# Patient Record
Sex: Male | Born: 1955 | Race: White | Hispanic: No | Marital: Single | Smoking: Former smoker
Health system: Southern US, Community
[De-identification: ages and names within clinical notes are randomized; demographics above are authoritative.]

## PROBLEM LIST (undated history)

## (undated) DIAGNOSIS — K529 Noninfective gastroenteritis and colitis, unspecified: Secondary | ICD-10-CM

## (undated) DIAGNOSIS — K209 Esophagitis, unspecified without bleeding: Secondary | ICD-10-CM

## (undated) DIAGNOSIS — K589 Irritable bowel syndrome without diarrhea: Secondary | ICD-10-CM

## (undated) DIAGNOSIS — E222 Syndrome of inappropriate secretion of antidiuretic hormone: Secondary | ICD-10-CM

## (undated) DIAGNOSIS — D649 Anemia, unspecified: Secondary | ICD-10-CM

## (undated) DIAGNOSIS — M459 Ankylosing spondylitis of unspecified sites in spine: Secondary | ICD-10-CM

## (undated) DIAGNOSIS — F1011 Alcohol abuse, in remission: Secondary | ICD-10-CM

## (undated) DIAGNOSIS — K602 Anal fissure, unspecified: Secondary | ICD-10-CM

## (undated) DIAGNOSIS — G8929 Other chronic pain: Secondary | ICD-10-CM

## (undated) DIAGNOSIS — K089 Disorder of teeth and supporting structures, unspecified: Secondary | ICD-10-CM

## (undated) DIAGNOSIS — R51 Headache: Secondary | ICD-10-CM

## (undated) DIAGNOSIS — K635 Polyp of colon: Secondary | ICD-10-CM

## (undated) DIAGNOSIS — F32A Depression, unspecified: Secondary | ICD-10-CM

## (undated) DIAGNOSIS — I10 Essential (primary) hypertension: Secondary | ICD-10-CM

## (undated) DIAGNOSIS — F329 Major depressive disorder, single episode, unspecified: Secondary | ICD-10-CM

## (undated) DIAGNOSIS — K219 Gastro-esophageal reflux disease without esophagitis: Secondary | ICD-10-CM

## (undated) DIAGNOSIS — R519 Headache, unspecified: Secondary | ICD-10-CM

## (undated) DIAGNOSIS — K2970 Gastritis, unspecified, without bleeding: Secondary | ICD-10-CM

## (undated) DIAGNOSIS — F419 Anxiety disorder, unspecified: Secondary | ICD-10-CM

## (undated) DIAGNOSIS — K317 Polyp of stomach and duodenum: Secondary | ICD-10-CM

## (undated) DIAGNOSIS — M1990 Unspecified osteoarthritis, unspecified site: Secondary | ICD-10-CM

## (undated) HISTORY — DX: Headache: R51

## (undated) HISTORY — PX: ESOPHAGOGASTRODUODENOSCOPY: SHX1529

## (undated) HISTORY — DX: Anal fissure, unspecified: K60.2

## (undated) HISTORY — PX: COLONOSCOPY W/ BIOPSIES: SHX1374

## (undated) HISTORY — DX: Anemia, unspecified: D64.9

## (undated) HISTORY — PX: TONSILLECTOMY: SUR1361

## (undated) HISTORY — DX: Polyp of colon: K63.5

## (undated) HISTORY — DX: Polyp of stomach and duodenum: K31.7

## (undated) HISTORY — DX: Headache, unspecified: R51.9

## (undated) HISTORY — PX: HERNIA REPAIR: SHX51

## (undated) HISTORY — DX: Unspecified osteoarthritis, unspecified site: M19.90

## (undated) HISTORY — DX: Other chronic pain: G89.29

---

## 1997-05-13 ENCOUNTER — Encounter: Payer: Self-pay | Admitting: Internal Medicine

## 1998-07-20 ENCOUNTER — Other Ambulatory Visit: Admission: RE | Admit: 1998-07-20 | Discharge: 1998-07-20 | Payer: Self-pay | Admitting: Internal Medicine

## 2000-03-26 ENCOUNTER — Ambulatory Visit (HOSPITAL_COMMUNITY): Admission: RE | Admit: 2000-03-26 | Discharge: 2000-03-26 | Payer: Self-pay | Admitting: Internal Medicine

## 2000-03-26 ENCOUNTER — Encounter: Payer: Self-pay | Admitting: Internal Medicine

## 2004-08-11 ENCOUNTER — Ambulatory Visit: Payer: Self-pay | Admitting: Internal Medicine

## 2004-09-01 ENCOUNTER — Ambulatory Visit: Payer: Self-pay | Admitting: Internal Medicine

## 2004-09-14 ENCOUNTER — Ambulatory Visit: Payer: Self-pay | Admitting: Internal Medicine

## 2005-04-25 ENCOUNTER — Ambulatory Visit: Payer: Self-pay | Admitting: Internal Medicine

## 2006-04-03 ENCOUNTER — Ambulatory Visit: Payer: Self-pay | Admitting: Internal Medicine

## 2006-04-24 ENCOUNTER — Ambulatory Visit: Payer: Self-pay | Admitting: Internal Medicine

## 2007-07-01 ENCOUNTER — Telehealth: Payer: Self-pay | Admitting: Internal Medicine

## 2007-07-03 DIAGNOSIS — K589 Irritable bowel syndrome without diarrhea: Secondary | ICD-10-CM

## 2007-07-03 DIAGNOSIS — K279 Peptic ulcer, site unspecified, unspecified as acute or chronic, without hemorrhage or perforation: Secondary | ICD-10-CM | POA: Insufficient documentation

## 2007-07-03 DIAGNOSIS — I10 Essential (primary) hypertension: Secondary | ICD-10-CM

## 2007-07-17 ENCOUNTER — Ambulatory Visit: Payer: Self-pay | Admitting: Internal Medicine

## 2008-07-14 ENCOUNTER — Telehealth: Payer: Self-pay | Admitting: Internal Medicine

## 2008-07-14 ENCOUNTER — Encounter (INDEPENDENT_AMBULATORY_CARE_PROVIDER_SITE_OTHER): Payer: Self-pay | Admitting: *Deleted

## 2008-07-14 ENCOUNTER — Inpatient Hospital Stay (HOSPITAL_COMMUNITY): Admission: EM | Admit: 2008-07-14 | Discharge: 2008-07-15 | Payer: Self-pay | Admitting: Emergency Medicine

## 2008-07-14 ENCOUNTER — Ambulatory Visit: Payer: Self-pay | Admitting: Internal Medicine

## 2008-07-21 ENCOUNTER — Telehealth: Payer: Self-pay | Admitting: Internal Medicine

## 2008-08-03 ENCOUNTER — Encounter: Payer: Self-pay | Admitting: Internal Medicine

## 2008-08-10 ENCOUNTER — Ambulatory Visit (HOSPITAL_COMMUNITY): Admission: RE | Admit: 2008-08-10 | Discharge: 2008-08-10 | Payer: Self-pay | Admitting: Urology

## 2008-08-11 ENCOUNTER — Telehealth: Payer: Self-pay | Admitting: Internal Medicine

## 2008-08-13 ENCOUNTER — Ambulatory Visit: Payer: Self-pay | Admitting: Internal Medicine

## 2008-08-13 DIAGNOSIS — Z9119 Patient's noncompliance with other medical treatment and regimen: Secondary | ICD-10-CM

## 2008-08-13 DIAGNOSIS — F1020 Alcohol dependence, uncomplicated: Secondary | ICD-10-CM | POA: Insufficient documentation

## 2008-08-13 DIAGNOSIS — Z86010 Personal history of colonic polyps: Secondary | ICD-10-CM

## 2008-08-18 ENCOUNTER — Ambulatory Visit: Payer: Self-pay | Admitting: Internal Medicine

## 2008-08-19 ENCOUNTER — Telehealth: Payer: Self-pay | Admitting: Internal Medicine

## 2008-08-27 ENCOUNTER — Telehealth: Payer: Self-pay | Admitting: Internal Medicine

## 2008-08-31 ENCOUNTER — Telehealth: Payer: Self-pay | Admitting: Internal Medicine

## 2008-09-14 ENCOUNTER — Telehealth: Payer: Self-pay | Admitting: Internal Medicine

## 2008-09-15 ENCOUNTER — Telehealth: Payer: Self-pay | Admitting: Internal Medicine

## 2008-09-23 ENCOUNTER — Telehealth: Payer: Self-pay | Admitting: Internal Medicine

## 2008-10-12 ENCOUNTER — Telehealth: Payer: Self-pay | Admitting: Internal Medicine

## 2008-10-22 ENCOUNTER — Telehealth: Payer: Self-pay | Admitting: Internal Medicine

## 2008-10-23 ENCOUNTER — Encounter: Payer: Self-pay | Admitting: Internal Medicine

## 2008-10-23 ENCOUNTER — Ambulatory Visit: Payer: Self-pay | Admitting: Internal Medicine

## 2008-11-03 ENCOUNTER — Encounter: Payer: Self-pay | Admitting: Internal Medicine

## 2008-11-04 ENCOUNTER — Telehealth: Payer: Self-pay | Admitting: Internal Medicine

## 2008-11-06 ENCOUNTER — Telehealth: Payer: Self-pay | Admitting: Internal Medicine

## 2008-11-30 ENCOUNTER — Telehealth (INDEPENDENT_AMBULATORY_CARE_PROVIDER_SITE_OTHER): Payer: Self-pay | Admitting: *Deleted

## 2008-12-01 ENCOUNTER — Ambulatory Visit: Payer: Self-pay | Admitting: Internal Medicine

## 2008-12-04 LAB — CONVERTED CEMR LAB
ALT: 10 units/L (ref 0–53)
AST: 20 units/L (ref 0–37)
Albumin: 4.1 g/dL (ref 3.5–5.2)
Alkaline Phosphatase: 106 units/L (ref 39–117)
BUN: 4 mg/dL — ABNORMAL LOW (ref 6–23)
Basophils Absolute: 0 10*3/uL (ref 0.0–0.1)
Basophils Relative: 0.1 % (ref 0.0–3.0)
Bilirubin, Direct: 0.1 mg/dL (ref 0.0–0.3)
CO2: 32 meq/L (ref 19–32)
Calcium: 9.6 mg/dL (ref 8.4–10.5)
Chloride: 97 meq/L (ref 96–112)
Creatinine, Ser: 0.6 mg/dL (ref 0.4–1.5)
Eosinophils Absolute: 0.1 10*3/uL (ref 0.0–0.7)
Eosinophils Relative: 0.8 % (ref 0.0–5.0)
GFR calc non Af Amer: 149.98 mL/min (ref >60)
Glucose, Bld: 99 mg/dL (ref 70–99)
HCT: 44.3 % (ref 39.0–52.0)
Hemoglobin: 15.2 g/dL (ref 13.0–17.0)
Lymphocytes Relative: 11.7 % — ABNORMAL LOW (ref 12.0–46.0)
Lymphs Abs: 1 10*3/uL (ref 0.7–4.0)
MCHC: 34.4 g/dL (ref 30.0–36.0)
MCV: 96.6 fL (ref 78.0–100.0)
Monocytes Absolute: 0.8 10*3/uL (ref 0.1–1.0)
Monocytes Relative: 9.7 % (ref 3.0–12.0)
Neutro Abs: 6.4 10*3/uL (ref 1.4–7.7)
Neutrophils Relative %: 77.7 % — ABNORMAL HIGH (ref 43.0–77.0)
Platelets: 414 10*3/uL — ABNORMAL HIGH (ref 150.0–400.0)
Potassium: 3.7 meq/L (ref 3.5–5.1)
RBC: 4.59 M/uL (ref 4.22–5.81)
RDW: 11.7 % (ref 11.5–14.6)
Sed Rate: 19 mm/hr (ref 0–22)
Sodium: 139 meq/L (ref 135–145)
TSH: 1 microintl units/mL (ref 0.35–5.50)
Total Bilirubin: 0.6 mg/dL (ref 0.3–1.2)
Total CK: 45 units/L (ref 7–232)
Total Protein: 8.1 g/dL (ref 6.0–8.3)
WBC: 8.3 10*3/uL (ref 4.5–10.5)

## 2008-12-08 ENCOUNTER — Telehealth: Payer: Self-pay | Admitting: Internal Medicine

## 2008-12-17 ENCOUNTER — Ambulatory Visit: Payer: Self-pay | Admitting: Internal Medicine

## 2008-12-17 ENCOUNTER — Telehealth: Payer: Self-pay | Admitting: Internal Medicine

## 2008-12-31 ENCOUNTER — Ambulatory Visit: Payer: Self-pay | Admitting: Internal Medicine

## 2009-03-09 ENCOUNTER — Encounter (INDEPENDENT_AMBULATORY_CARE_PROVIDER_SITE_OTHER): Payer: Self-pay | Admitting: *Deleted

## 2009-08-25 ENCOUNTER — Telehealth: Payer: Self-pay | Admitting: Internal Medicine

## 2009-09-24 ENCOUNTER — Emergency Department (HOSPITAL_COMMUNITY): Admission: EM | Admit: 2009-09-24 | Discharge: 2009-09-24 | Payer: Self-pay | Admitting: Family Medicine

## 2009-09-27 ENCOUNTER — Emergency Department (HOSPITAL_COMMUNITY): Admission: EM | Admit: 2009-09-27 | Discharge: 2009-09-27 | Payer: Self-pay | Admitting: Emergency Medicine

## 2009-09-27 ENCOUNTER — Telehealth: Payer: Self-pay | Admitting: Internal Medicine

## 2009-10-01 ENCOUNTER — Telehealth: Payer: Self-pay | Admitting: Internal Medicine

## 2009-10-14 ENCOUNTER — Telehealth (INDEPENDENT_AMBULATORY_CARE_PROVIDER_SITE_OTHER): Payer: Self-pay | Admitting: *Deleted

## 2009-11-17 ENCOUNTER — Ambulatory Visit: Payer: Self-pay | Admitting: Internal Medicine

## 2009-11-18 LAB — CONVERTED CEMR LAB
ALT: 14 units/L (ref 0–53)
AST: 19 units/L (ref 0–37)
Albumin: 4.3 g/dL (ref 3.5–5.2)
Alkaline Phosphatase: 109 units/L (ref 39–117)
BUN: 7 mg/dL (ref 6–23)
Bilirubin, Direct: 0.2 mg/dL (ref 0.0–0.3)
CO2: 31 meq/L (ref 19–32)
Calcium: 9.6 mg/dL (ref 8.4–10.5)
Chloride: 94 meq/L — ABNORMAL LOW (ref 96–112)
Creatinine, Ser: 0.6 mg/dL (ref 0.4–1.5)
GFR calc non Af Amer: 155.39 mL/min (ref >60)
Glucose, Bld: 69 mg/dL — ABNORMAL LOW (ref 70–99)
Potassium: 4.1 meq/L (ref 3.5–5.1)
Sodium: 137 meq/L (ref 135–145)
TSH: 1.18 microintl units/mL (ref 0.35–5.50)
Total Bilirubin: 0.7 mg/dL (ref 0.3–1.2)
Total Protein: 7.5 g/dL (ref 6.0–8.3)

## 2009-11-25 ENCOUNTER — Telehealth: Payer: Self-pay | Admitting: Internal Medicine

## 2010-03-08 ENCOUNTER — Telehealth: Payer: Self-pay | Admitting: Internal Medicine

## 2010-04-19 ENCOUNTER — Ambulatory Visit: Payer: Self-pay | Admitting: Internal Medicine

## 2010-04-19 DIAGNOSIS — M549 Dorsalgia, unspecified: Secondary | ICD-10-CM

## 2010-04-25 ENCOUNTER — Telehealth: Payer: Self-pay | Admitting: Internal Medicine

## 2010-05-09 ENCOUNTER — Telehealth: Payer: Self-pay | Admitting: Internal Medicine

## 2010-05-09 ENCOUNTER — Ambulatory Visit: Payer: Self-pay | Admitting: Internal Medicine

## 2010-05-10 ENCOUNTER — Telehealth: Payer: Self-pay | Admitting: Internal Medicine

## 2010-05-13 ENCOUNTER — Telehealth: Payer: Self-pay | Admitting: Internal Medicine

## 2010-05-17 ENCOUNTER — Telehealth: Payer: Self-pay | Admitting: Internal Medicine

## 2010-07-05 NOTE — Progress Notes (Signed)
Summary: med question  Phone Note From Pharmacy   Caller: [facility]  [phone]* Call For: swords  Reason for Call: Needs renewal Summary of Call: refill lorazepam 0.79m 1 by mouth every 6 hours as needed (not to exceed 15 in 30 days)....refill hx is 04/11/10, 03/08/10, 02/08/10 Initial call taken by: CTownsend Roger CPisgah  May 09, 2010 3:58 PM  Follow-up for Phone Call        ok x 3 Follow-up by: BPhoebe SharpsMD,  May 10, 2010 8:21 AM    Prescriptions: LORAZEPAM 0.5 MG TABS (LORAZEPAM) Take one tablet every 6 hours as needed nerves--max 15 tabs in 30 days  #15 x 3   Entered by:   FRolla FlattenCMA   Authorized by:   BPhoebe SharpsMD   Signed by:   FRolla FlattenCMA on 05/10/2010   Method used:   Printed then faxed to ...       [facility][phone] (retail)       [address]      [place]  [address]      Ph: [phone] [phone]      Fax: [phone]  RxID:   [id number]

## 2010-07-05 NOTE — Progress Notes (Signed)
Summary: requesting new rx for nasal spray  Phone Note Call from Patient Call back at [phone]   Caller: Patient---live call Summary of Call: pt is requesting Fluconazole spray for his nose. Send to [facility] on [address]. Initial call taken by: Despina Arias,  March 08, 2010 9:42 AM  Follow-up for Phone Call        assuming---fluticasone see Rx Follow-up by: [name] MD,  March 08, 2010 10:23 AM    New/Updated Medications: FLUTICASONE PROPIONATE 50 MCG/ACT  SUSP (FLUTICASONE PROPIONATE) 2 sprays each nostril once daily Prescriptions: FLUTICASONE PROPIONATE 50 MCG/ACT  SUSP (FLUTICASONE PROPIONATE) 2 sprays each nostril once daily  #1 vial x 3   Entered and Authorized by:   [name] MD   Signed by:   [name] MD on 03/08/2010   Method used:   Electronically to        [facility]  [phone]* (retail)       [address]       [place], [place]  [address]       Ph: [phone] or [phone]       Fax: [phone]   RxID:   [id number]

## 2010-07-05 NOTE — Assessment & Plan Note (Signed)
Summary: fup//ccm/pt rescd//ccm/mom rescd from bump//ccm   Vital Signs:  Patient profile:   55 year old male Weight:      153 pounds BMI:     22.03 Temp:     98.5 degrees F oral Pulse rate:   68 / minute Pulse rhythm:   regular Resp:     12 per minute BP sitting:   164 / 82  (left arm) Cuff size:   regular  Vitals Entered By: Rica Records, RN (November 17, 2009 10:27 AM) CC: med review, needs refill lorazepam Is Patient Diabetic? No   Primary Care Provider:  [name] MD  CC:  med review and needs refill lorazepam.  History of Present Illness:  Follow-Up Visit      This is a 55 year old man who presents for Follow-up visit.  The patient denies chest pain and palpitations.  Since the last visit the patient notes a recent ED visit.  The patient reports taking meds as prescribed.  When questioned about possible medication side effects, the patient notes none.   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 1 By Mouth Once Daily 2)  Lorazepam 0.5 Mg Tabs (Lorazepam) .... Take One Tablet Every 6 Hours As Needed Nerves--Max 15 Tabs in 30 Days 3)  Nexium 40 Mg Cpdr (Esomeprazole Magnesium) .... Take 1 Capsule By Mouth Once A Day  Allergies: 1)  ! Benadryl  Past History:  Past Medical History: Last updated:  08/13/2008 GERD Hypertension IBS Left-sided ul;cerative colitis BPH  Past Surgical History: Last updated: 08/13/2008 Hernia Surgery  Family History: Last updated: 08/13/2008 mother  MI Family History of Colon Cancer:mother Family History of Colitis/Crohn's: father  Social History: Last updated: 08/13/2008 Occupation: unemployed Patient is a former smoker.  Alcohol Use - yes 2-3 beers per day Daily Caffeine Use 1 per day Illicit Drug Use - no Patient does not get regular exercise.   Risk Factors: Exercise: no (08/13/2008)  Risk Factors: Smoking Status: quit (11/17/2009) Packs/Day: 2-3 cigs (07/03/2007)  Physical Exam  General:  alert and well-developed.  appears older than stated age Head:  normocephalic and atraumatic.   Eyes:  pupils equal and pupils round.   Ears:  R ear normal and L ear normal.   Neck:  No deformities, masses, or tenderness noted. Chest Wall:  no deformities and no tenderness.   Lungs:  normal respiratory effort and no intercostal retractions.   Heart:  normal rate and regular rhythm.   Abdomen:  Soft, nontender and nondistended. No masses, hepatosplenomegaly or hernias noted. Normal bowel sounds.  thin Msk:  No deformity or scoliosis noted of thoracic or lumbar spine.   Neurologic:  cranial nerves II-XII intact and gait normal.     Impression & Recommendations:  Problem # 1:  HYPONATREMIA (ICD-276.1) needs f/u  Orders: TLB-BMP (Basic Metabolic Panel-BMET) (30160-FUXNATF) TLB-TSH (Thyroid  Stimulating Hormone) (84443-TSH)  Problem # 2:  ALCOHOLISM (ICD-303.90) this is likely his main issue advised that he call [facility] Orders: Venipuncture (73419) TLB-Hepatic/Liver Function Pnl (80076-HEPATIC)  Problem # 3:  HYPERTENSION (ICD-401.9) out of meds will refill His updated medication list for this problem includes:    Verapamil Hcl Cr 240 Mg Tbcr (Verapamil hcl) .Marland Kitchen... 1 by mouth once daily  BP today: 164/82 Prior BP: 150/76  (12/01/2008)  Labs Reviewed: K+: 3.7 (12/01/2008) Creat: : 0.6 (12/01/2008)     Complete Medication List: 1)  Verapamil Hcl Cr 240 Mg Tbcr (Verapamil hcl) .Marland Kitchen.. 1 by mouth once daily 2)  Lorazepam 0.5 Mg Tabs (Lorazepam) .... Take one tablet every 6 hours as needed nerves--max 15 tabs in 30 days 3)  Nexium 40 Mg Cpdr (Esomeprazole magnesium) .... Take 1 capsule by mouth once a day Prescriptions: LORAZEPAM 0.5 MG TABS (LORAZEPAM) Take one tablet every 6 hours as needed nerves--max 15 tabs in 30 days  #15 x 1   Entered and Authorized by:   [name] MD   Signed by:   [name] MD on 11/17/2009   Method used:   Print then Give to Patient   RxID:   [id number] VERAPAMIL HCL CR 240 MG TBCR (VERAPAMIL HCL) 1 by mouth once daily  #90 Tablet x 3   Entered and Authorized by:   [name] MD   Signed by:   [name] MD on 11/17/2009   Method used:   Electronically to        [facility]  [phone]* (retail)       [address]       [place], [place]  [address]       Ph: [phone] or [phone]       Fax: [phone]   RxID:   [id number]

## 2010-07-05 NOTE — Progress Notes (Signed)
  Phone Note Call from Patient Call back at Home Phone [phone]   Caller: Patient Call For: [name] MD Summary of Call: Pt states he cannot take NSAID due to ulcerative colitis. Initial call taken by: [facility] CMA AAMA,  May 13, 2010 1:33 PM

## 2010-07-05 NOTE — Progress Notes (Signed)
  Phone Note Call from Patient   Caller: Patient Call For: [name] MD Summary of Call: Pt calls stating he is extremely weak, cannot eat, ? fever, was put on antibiotics 3 days ago but feels worse.  Does not feel well enough to come to the office.  Will go to the ER. Initial call taken by: Deanna Artis CMA,  September 27, 2009 10:11 AM

## 2010-07-05 NOTE — Progress Notes (Signed)
Summary: bloodwork results  Phone Note Call from Patient Call back at Home Phone [phone]   Caller: Patient Call For: [name] MD Summary of Call: pt would like bloodwork results Initial call taken by: Glo Herring,  November 25, 2009 2:30 PM  Follow-up for Phone Call        tried to call line is busy Follow-up by: Westley Hummer CMA Deborra Medina),  November 25, 2009 3:22 PM  Additional Follow-up for Phone Call Additional follow up Details #1::        Patient notified. Was told we had tried last week but phone had been disconnected.  Have correct number now. Additional Follow-up by: Rica Records, RN,  November 26, 2009 12:41 PM

## 2010-07-05 NOTE — Progress Notes (Signed)
Summary: FYI  Phone Note Call from Patient   Caller: Mom-phil Call For: [name] MD Summary of Call: mom would like nurse to return her call Initial call taken by: Glo Herring,  August 25, 2009 9:31 AM  Follow-up for Phone Call        Mom is calling to let Dr Leanne Chang know before the patient comes in about what is going on.  She is"afraid that the patient will not tell everything".  He has anxiety, problems picking up things, and back and neck pain.  He has an appointment Friday morning Follow-up by: Westley Hummer CMA Deborra Medina),  August 25, 2009 11:36 AM

## 2010-07-05 NOTE — Progress Notes (Signed)
Summary: Pt req to get order to have blood sodium lvl checked  Phone Note Call from Patient Call back at Home Phone [phone]   Caller: Patient Summary of Call: Pt says that he is suppose to sch labs for blood sodium lvl. Need an order. Please advise. Initial call taken by: Braulio Bosch,  October 01, 2009 5:01 PM  Follow-up for Phone Call        ok 995.2 Follow-up by: [name] MD,  Oct 04, 2009 9:33 AM

## 2010-07-05 NOTE — Assessment & Plan Note (Signed)
Summary: BACK PAIN/NJR/PT HAS SORETHROAT PAIN/CONGESTION/CJR   Vital Signs:  Patient profile:   55 year old male Weight:      160 pounds Temp:     98.3 degrees F oral BP sitting:   142 / 74  (left arm) Cuff size:   regular  Vitals Entered By: Townsend Roger, CMA (April 19, 2010 11:18 AM) CC: back pain, st, congestion x3-4 days, Back pain   Primary Care Provider:  [name] MD  CC:  back pain, st, congestion x3-4 days, and Back pain.  History of Present Illness:  Back Pain      This is a 55 year old man who presents with Back pain.  The symptoms began 1 year or more ago.  The patient denies fever, chills, weakness, loss of sensation, fecal incontinence, and urinary incontinence.  The pain is located in the mid low back.  The pain began at home and gradually.  The pain is made worse by standing or walking.  The pain is made better by inactivity.  Risk factors for serious underlying conditions include duration of pain > 1 month and age >= 50 years.    ROS: All other systems reviewed and were negative except for chronic fatigue recent  ST, no fever or chills, sxs for 3 days.     Current Medications (verified): 1)  Verapamil Hcl Cr 240 Mg Tbcr (Verapamil Hcl) .Marland Kitchen.. 1 By Mouth Once Daily 2)  Lorazepam 0.5 Mg Tabs (Lorazepam) .... Take One Tablet Every 6 Hours As Needed Nerves--Max 15 Tabs in 30 Days 3)  Nexium 40 Mg Cpdr (Esomeprazole Magnesium) .... Take 1 Capsule By Mouth Once A Day 4)  Fluticasone Propionate 50 Mcg/act  Susp (Fluticasone Propionate) .... 2 Sprays Each Nostril Once Daily  Allergies (verified): 1)  ! Benadryl  Physical Exam  General:  well-developed well-nourished male in no acute distress. HEENT exam atraumatic, normocephalic symmetric her muscles are intact. Oropharynx is moist. Posterior oropharynx without erythema or exudate. Neck is supple without lymphadenopathy chest clear to auscultation cardiac exam S1-S2 are regular. Examination of back appears normal.  No palpable tenderness to lumbar spine. Straight leg raise is negative. Deep tendon reflexes are normal bilaterally in the lower extremities. Gait is normal.   Impression & Recommendations:  Problem # 1:  BACK PAIN (ICD-724.5) I suspect this is chronic musculoskeletal back pain. Given the duration and is best to check an x-ray. I'll arrange that next week. I'll call patient back with the results and further recommendations. Orders: T-Lumbar Spine Complete, 5 Views (71110TC)  Problem # 2:  URI (ICD-465.9) no evidence of bacterial infection. call for any concerns, increased sxs, fever, persistence of sxs, wheeze, SOB.   Complete Medication List: 1)  Verapamil Hcl Cr 240 Mg Tbcr (Verapamil hcl) .Marland Kitchen.. 1 by mouth once daily 2)  Lorazepam 0.5 Mg Tabs (Lorazepam) .... Take one tablet every 6 hours as needed nerves--max 15 tabs in 30 days 3)  Nexium 40 Mg Cpdr (Esomeprazole magnesium) .... Take 1 capsule by mouth once a day 4)  Fluticasone Propionate 50 Mcg/act Susp (Fluticasone propionate) .... 2 sprays each nostril once daily   Orders Added: 1)  Est. Patient Level III [37290] 2)  T-Lumbar Spine Complete, 5 Views [71110TC]

## 2010-07-05 NOTE — Progress Notes (Signed)
Summary: pain med  Phone Note Outgoing Call   Caller: Patient Call placed by: Rolla Flatten CMA,  May 10, 2010 9:25 AM Details for Reason: call patient. Has changes of osteoarthritis in the back. If he is interested it might be worth sending him to physical therapy for evaluation and treatment back pain.   Summary of Call: Pt decline to do PT now due to not having insurance. Pt states that he discuss with Dr Judd Gaudier about getting a pain med Rx but was told he would have to wait for result of x-ray. Pt uses [facility]. Pls advise on med..............Marland KitchenFelecia Deloach CMA  May 10, 2010 9:28 AM   Follow-up for Phone Call        diclofenac 50 mg by mouth once daily as needed for pain. not to exceed 15 per month #15/3 refills Follow-up by: [name] MD,  May 10, 2010 11:45 AM  Additional Follow-up for Phone Call Additional follow up Details #1::        rx called in, pt aware Additional Follow-up by: Townsend Roger, [place],  May 10, 2010 3:45 PM    New/Updated Medications: DICLOFENAC SODIUM 50 MG TBEC (DICLOFENAC SODIUM) 1 by mouth once daily as needed for pain, not to exceed 15 per month Prescriptions: DICLOFENAC SODIUM 50 MG TBEC (DICLOFENAC SODIUM) 1 by mouth once daily as needed for pain, not to exceed 15 per month  #15 x 3   Entered by:   Townsend Roger, CMA   Authorized by:   [name] MD   Signed by:   Townsend Roger, CMA on 05/10/2010   Method used:   Electronically to        [facility]  [phone]* (retail)       [address]       [place], [place]  [address]       Ph: [phone] or [phone]       Fax: [phone]   RxID:   [id number]

## 2010-07-05 NOTE — Progress Notes (Signed)
Summary: FYI (INFO ONLY)  Phone Note Call from Patient   Caller: Patient      Summary of Call: INFO ONLY..... Pts mom wants Dr Leanne Chang aware of some things, request that note be sent ref same.......Marland KitchenPts mom called to adv that the pt will be going for his back xray today.... pt can't lift anything and is having problems ambulating....also adv pt is having problems with being restless, a nervousness the day prior to a doctors appt.....adv pt will call for appt with Dr Leanne Chang once he has had his xray done.  Initial call taken by: Duanne Moron,  April 25, 2010 9:13 AM

## 2010-07-05 NOTE — Assessment & Plan Note (Signed)
Summary: reck/jls   Vital Signs:  Patient Profile:   55 Years Old Male Weight:      134 pounds Temp:     98.4 degrees F oral Pulse rate:   72 / minute Pulse rhythm:   regular BP sitting:   150 / 78  (left arm)  Vitals Entered By: Chipper Oman, RN (July 17, 2007 10:21 AM)                 Chief Complaint:  ROV; ran out of med.Marland Kitchen  History of Present Illness: pt noncompliant with followup due to lack of insurance.  no trouble on meds for HTN--he has been out of medications for 6 days.  Hx of IBS---has seen GI    Current Allergies: No known allergies   Past Medical History:    Reviewed history from 07/03/2007 and no changes required:       GERD       Hypertension       irritable bowel syndrome   Family History:    mother  MI    Review of Systems       no other complaints in a complete ROS    Physical Exam  General:     Well-developed,well-nourished,in no acute distress; alert,appropriate and cooperative throughout examination Head:     atraumatic and no abnormalities observed.   Eyes:     pupils equal and pupils round.   Neck:     No deformities, masses, or tenderness noted. Lungs:     Normal respiratory effort, chest expands symmetrically. Lungs are clear to auscultation, no crackles or wheezes. Heart:     Normal rate and regular rhythm. S1 and S2 normal without gallop, murmur, click, rub or other extra sounds.    Impression & Recommendations:  Problem # 1:  HYPERTENSION (ICD-401.9) resume meds His updated medication list for this problem includes:    Verapamil Hcl Cr 240 Mg Tbcr (Verapamil hcl) .Marland Kitchen... 1 by mouth once daily needs ov   Complete Medication List: 1)  Verapamil Hcl Cr 240 Mg Tbcr (Verapamil hcl) .Marland Kitchen.. 1 by mouth once daily needs ov 2)  Proctocort 30 Mg Supp (Hydrocortisone acetate) .... Administer 1 suppository into rectum     Prescriptions: VERAPAMIL HCL CR 240 MG TBCR (VERAPAMIL HCL) 1 by mouth once daily needs ov  #90 x  11   Entered and Authorized by:   [name] MD   Signed by:   [name] MD on 07/17/2007   Method used:   Electronically sent to ...       [facility]  [phone]*       [place], [place]  [address]       Ph: [phone] or [phone]       Fax: [phone]   RxID:   [id number]  ]

## 2010-07-05 NOTE — Progress Notes (Signed)
Summary: Schedule recall office visit   Phone Note Outgoing Call Call back at [facility] Phone [phone]   Call placed by: Marlon Pel CMA Deborra Medina),  Oct 14, 2009 4:19 PM Call placed to: Patient Summary of Call: Called pt to schedule recall office visit. Pt states he has a lot of other medical problems and doctor's office appointments right now and will have to call us back to schedule a office visit.  Initial call taken by: Marlon Pel CMA [facility]),  Oct 14, 2009 4:20 PM

## 2010-07-07 NOTE — Progress Notes (Signed)
Summary: change med  Phone Note Call from Patient Call back at Home Phone [phone]   Caller: Patient Call For: swords Summary of Call: pt can't take Diclofenac (no NSAIDS) can you give him something else [facility] on [address] Initial call taken by: Townsend Roger, [place],  May 17, 2010 12:09 PM  Follow-up for Phone Call        put nsaids on allergy list  could try ultram 50 mg by mouth once daily as needed for pain not to  15 per month. #15/ 3 Follow-up by: [name] MD,  May 17, 2010 2:29 PM  Additional Follow-up for Phone Call Additional follow up Details #1::        Phone Call Completed, Rx Called In Additional Follow-up by: Townsend Roger, [place],  May 18, 2010 11:27 AM   New Allergies: NSAIDS New/Updated Medications: ULTRAM 50 MG TABS (TRAMADOL HCL) 1 by mouth once daily as needed for pain not to exceed 15 per month New Allergies: NSAIDSPrescriptions: ULTRAM 50 MG TABS (TRAMADOL HCL) 1 by mouth once daily as needed for pain not to exceed 15 per month  #15 x 3   Entered by:   Townsend Roger, CMA   Authorized by:   [name] MD   Signed by:   Townsend Roger, CMA on 05/18/2010   Method used:   Electronically to        [facility]  [phone]* (retail)       [address]       [place], [place]  [address]       Ph: [phone] or [phone]       Fax: [phone]   RxID:   [id number]

## 2010-07-11 ENCOUNTER — Ambulatory Visit: Payer: Self-pay | Admitting: Internal Medicine

## 2010-08-22 ENCOUNTER — Encounter: Payer: Self-pay | Admitting: Internal Medicine

## 2010-08-22 ENCOUNTER — Ambulatory Visit (INDEPENDENT_AMBULATORY_CARE_PROVIDER_SITE_OTHER): Payer: Self-pay | Admitting: Internal Medicine

## 2010-08-22 DIAGNOSIS — N400 Enlarged prostate without lower urinary tract symptoms: Secondary | ICD-10-CM | POA: Insufficient documentation

## 2010-08-22 DIAGNOSIS — K589 Irritable bowel syndrome without diarrhea: Secondary | ICD-10-CM

## 2010-08-22 DIAGNOSIS — M549 Dorsalgia, unspecified: Secondary | ICD-10-CM

## 2010-08-22 DIAGNOSIS — I10 Essential (primary) hypertension: Secondary | ICD-10-CM

## 2010-08-23 ENCOUNTER — Telehealth: Payer: Self-pay | Admitting: Internal Medicine

## 2010-08-23 LAB — POCT I-STAT, CHEM 8
BUN: 4 mg/dL — ABNORMAL LOW (ref 6–23)
Chloride: 91 mEq/L — ABNORMAL LOW (ref 96–112)
Creatinine, Ser: 0.6 mg/dL (ref 0.4–1.5)
Glucose, Bld: 107 mg/dL — ABNORMAL HIGH (ref 70–99)
HCT: 50 % (ref 39.0–52.0)
Potassium: 3.8 mEq/L (ref 3.5–5.1)

## 2010-08-23 LAB — URINALYSIS, ROUTINE W REFLEX MICROSCOPIC
Bilirubin Urine: NEGATIVE
Glucose, UA: NEGATIVE mg/dL
Nitrite: NEGATIVE
Specific Gravity, Urine: 1.02 (ref 1.005–1.030)
pH: 6.5 (ref 5.0–8.0)

## 2010-08-23 LAB — URINE MICROSCOPIC-ADD ON

## 2010-08-23 NOTE — Telephone Encounter (Signed)
See above

## 2010-09-01 NOTE — Assessment & Plan Note (Signed)
Summary: NEW PT / SWITCH FROM DR Leanne Chang Bonney Leitz  #   Vital Signs:  Patient profile:   55 year old male Height:      70 inches Weight:      162 pounds BMI:     23.33 O2 Sat:      98 % on Room air Temp:     97.6 degrees F oral Pulse rate:   79 / minute BP sitting:   142 / 78  (left arm) Cuff size:   regular  Vitals Entered By: Charlynne Cousins CMA (August 22, 2010 9:03 AM)  O2 Flow:  Room air CC: new pt here to est care with primary/ ab   Primary Care Provider:  Adella Hare  CC:  new pt here to est care with primary/ ab.  History of Present Illness: Patinet presents to establish for on-gong care in transfer from another [place] office.   He has chornic back pain and has beend diagnosed as having OA spine. The diagnosis is by L-S spine films. He says that he is very limited in his activities: can't lift and has trouble with work, i.e. Haematologist.    Preventive Screening-Counseling & Management  Alcohol-Tobacco     Alcohol drinks/day: 0     Smoking Status: quit     Year Quit: 2012  Caffeine-Diet-Exercise     Caffeine use/day: 1 cup per day     Does Patient Exercise: no  Hep-HIV-STD-Contraception     Dental Visit-last 6 months no     Sun Exposure-Excessive: no  Safety-Violence-Falls     Seat Belt Use: yes     Firearms in the Home: no firearms in the home     Smoke Detectors: yes     Violence in the Home: no risk noted     Sexual Abuse: no     Fall Risk: low fall risk      Blood Transfusions:  no.    Current Medications (verified): 1)  Verapamil Hcl Cr 240 Mg Tbcr (Verapamil Hcl) .Marland Kitchen.. 1 By Mouth Once Daily 2)  Lorazepam 0.5 Mg Tabs (Lorazepam) .... Take One Tablet Every 6 Hours As Needed Nerves--Max 15 Tabs in 30 Days 3)  Nexium 40 Mg Cpdr (Esomeprazole Magnesium) .... Take 1 Capsule By Mouth Once A Day 4)  Fluticasone Propionate 50 Mcg/act  Susp (Fluticasone Propionate) .... 2 Sprays Each Nostril Once Daily 5)  Ultram 50 Mg Tabs (Tramadol Hcl) .Marland Kitchen.. 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  Lives with Mother   Alcohol Use - yes 2-3 beers per day Daily Caffeine Use 1 per day Illicit Drug Use - no Patient does not get regular exercise.  Caffeine use/day:  1 cup per day Dental Care w/in 6 mos.:  no Sun Exposure-Excessive:  no Seat Belt Use:  yes Fall  Risk:  low fall risk Blood Transfusions:  no  Review of Systems       The patient complains of weight gain, dyspnea on exertion, abdominal pain, severe indigestion/heartburn, muscle weakness, difficulty walking, and depression.  The patient denies anorexia, fever, weight loss, vision loss, decreased hearing, prolonged cough, melena, hematochezia, hematuria, abnormal bleeding, enlarged lymph nodes, and testicular masses.    Physical Exam  General:  Thin, grey haired white male in no acute distress Head:  Normocephalic and atraumatic without obvious abnormalities. No apparent alopecia or balding. Eyes:  No corneal or conjunctival inflammation noted. EOMI. Perrla. Funduscopic exam benign, without hemorrhages, exudates or papilledema. Vision grossly normal. Ears:  R ear normal.  Left EAC  with crumen impaction Nose:  no external deformity and no external erythema.   Mouth:  good dentition, no gingival abnormalities, and no dental plaque.   Neck:  supple, full ROM, and no thyromegaly.   Chest Wall:  no deformities and no tenderness.   Lungs:  Normal respiratory effort, chest expands symmetrically. Lungs are clear to auscultation, no crackles or wheezes. Heart:  Normal rate and regular rhythm. S1 and S2 normal without gallop, murmur, click, rub or other extra sounds. Abdomen:  soft, non-tender, normal bowel sounds, no guarding, and no hepatomegaly.   Msk:  normal ROM, no joint tenderness, no joint warmth, and no joint deformities.  Stiffness in the back with movement. Stiffness in the neck with movement Pulses:  2+ radial. 1+ DP right Extremities:  No clubbing, cyanosis, edema, or deformity noted with normal full range of motion of all joints.   Neurologic:  alert & oriented X3, cranial nerves II-XII intact, strength normal in all extremities, sensation intact to light touch, sensation intact to pinprick, and DTRs symmetrical and normal.   Skin:  turgor normal, color normal, no rashes, no suspicious lesions, and no ulcerations.   Cervical Nodes:  no anterior cervical adenopathy and no posterior cervical adenopathy.   Psych:  Oriented X3, memory intact for recent and remote, normally interactive, good eye contact, and not anxious appearing.     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 He does follw with urology.  Problem # 2:  BACK PAIN (ICD-724.5) Chronic back pain wih previous eval with L-S spine films. With no change in condition further imaging studies do not seem indicated at this time.  Plan - encouraged flex/stretch exercises to minimize back pain and to maintain mobility           continue on tramadol as needed.  His updated medication list for this problem  includes:    Ultram 50 Mg Tabs (Tramadol hcl) .Marland Kitchen... 1 by mouth once daily as needed for pain not to exceed 15 per month  Problem # 3:  ALCOHOLISM (ICD-303.90) Continues in is sobrietary and abstention.  Problem # 4:  ULCERATIVE PROCTITIS ? LEFT UC (ICD-556.2) Discovered at colonoscopy by Dr. Anda Kraft. He states that he will be scheduling followup with Dr. Carlean Purl as he was instructed. He is not at this time having abdominal pain or hematochezia.  Problem # 5:  IRRITABLE BOWEL SYNDROME (ICD-564.1) Not on any regular medication. symptoms seem to be quiesscent.  Plan - life-style management with fiber in the diet and possibly a dietary fiber supplement, e.g. metamucil  Problem # 6:  HYPERTENSION (ICD-401.9)  His updated medication list for this problem includes:    Verapamil Hcl Cr 240  Mg Tbcr (Verapamil hcl) .Marland Kitchen... 1 by mouth once daily  BP today: 142/78 Prior BP: 142/74 (04/19/2010)  Labs Reviewed: K+: 4.1 (11/17/2009) Creat: : 0.6 (11/17/2009)     Just suboptimally controlled.. No change in medication at this time. If his SBP remains above 140 will add diuretic.  Problem # 7:  Preventive Health Care (ICD-V70.0) History indicates his problems are stable at this time. His exam is unremarkable. No need for repeat labs until June. He is current with colonoscopy. Will follow-up on immunizations. He will return in 4-6 weeks.  Complete Medication List: 1)  Verapamil Hcl Cr 240 Mg Tbcr (Verapamil hcl) .Marland Kitchen.. 1 by mouth once daily 2)  Lorazepam 0.5 Mg Tabs (Lorazepam) .... Take one tablet every 6 hours as needed nerves--max 15 tabs in 30 days 3)  Nexium 40 Mg Cpdr (Esomeprazole magnesium) .... Take 1 capsule by mouth once a day 4)  Fluticasone Propionate 50 Mcg/act Susp (Fluticasone propionate) .... 2 sprays each nostril once daily 5)  Ultram 50 Mg Tabs (Tramadol hcl) .Marland Kitchen.. 1 by mouth once daily as needed for pain not to exceed 15 per month 6)  Fluoxetine Hcl 20 Mg Caps (Fluoxetine hcl) .Marland Kitchen..  1 by mouth once daily for anxiety and depression Prescriptions: LORAZEPAM 0.5 MG TABS (LORAZEPAM) Take one tablet every 6 hours as needed nerves--max 15 tabs in 30 days  #15 x 5   Entered and Authorized by:   Neena Rhymes MD   Signed by:   Neena Rhymes MD on 08/22/2010   Method used:   Print then Give to Patient   RxID:   [id number] FLUOXETINE HCL 20 MG CAPS (FLUOXETINE HCL) 1 by mouth once daily for anxiety and depression  #30 x 12   Entered and Authorized by:   Neena Rhymes MD   Signed by:   Neena Rhymes MD on 08/22/2010   Method used:   Electronically to        [facility]  [phone]* (retail)       [address]       [place], [place]  [address]       Ph: [phone] or [phone]       Fax: [phone]   RxID:   [id number]    Orders Added: 1)  New Patient Level III [phone]

## 2010-09-20 LAB — DIFFERENTIAL
Basophils Relative: 1 % (ref 0–1)
Monocytes Absolute: 0.6 10*3/uL (ref 0.1–1.0)
Monocytes Relative: 5 % (ref 3–12)
Neutro Abs: 9.1 10*3/uL — ABNORMAL HIGH (ref 1.7–7.7)

## 2010-09-20 LAB — CBC
HCT: 54.1 % — ABNORMAL HIGH (ref 39.0–52.0)
MCHC: 34.2 g/dL (ref 30.0–36.0)
Platelets: 294 10*3/uL (ref 150–400)
Platelets: 349 10*3/uL (ref 150–400)
RDW: 13.5 % (ref 11.5–15.5)
WBC: 10.8 10*3/uL — ABNORMAL HIGH (ref 4.0–10.5)

## 2010-09-20 LAB — URINALYSIS, ROUTINE W REFLEX MICROSCOPIC
Leukocytes, UA: NEGATIVE
Nitrite: NEGATIVE
Specific Gravity, Urine: 1.011 (ref 1.005–1.030)
Urobilinogen, UA: 0.2 mg/dL (ref 0.0–1.0)

## 2010-09-20 LAB — COMPREHENSIVE METABOLIC PANEL
ALT: 15 U/L (ref 0–53)
Albumin: 3.5 g/dL (ref 3.5–5.2)
Alkaline Phosphatase: 93 U/L (ref 39–117)
BUN: 2 mg/dL — ABNORMAL LOW (ref 6–23)
Potassium: 3.7 mEq/L (ref 3.5–5.1)
Sodium: 126 mEq/L — ABNORMAL LOW (ref 135–145)
Total Protein: 6.9 g/dL (ref 6.0–8.3)

## 2010-09-20 LAB — CARDIAC PANEL(CRET KIN+CKTOT+MB+TROPI)
CK, MB: 2.1 ng/mL (ref 0.3–4.0)
Total CK: 62 U/L (ref 7–232)
Troponin I: <0.01 ng/mL (ref 0.00–0.06)

## 2010-09-20 LAB — BASIC METABOLIC PANEL
GFR calc Af Amer: >60 mL/min (ref >60)
GFR calc non Af Amer: >60 mL/min (ref >60)
Potassium: 3.5 mEq/L (ref 3.5–5.1)
Sodium: 137 mEq/L (ref 135–145)

## 2010-09-20 LAB — URINE MICROSCOPIC-ADD ON

## 2010-09-20 LAB — LIPASE, BLOOD: Lipase: 21 U/L (ref 11–59)

## 2010-09-20 LAB — POCT CARDIAC MARKERS: CKMB, poc: <1 ng/mL — ABNORMAL LOW (ref 1.0–8.0)

## 2010-09-20 LAB — TROPONIN I: Troponin I: <0.01 ng/mL (ref 0.00–0.06)

## 2010-10-18 NOTE — Discharge Summary (Signed)
Anthony Lamb, SCHWAN             ACCOUNT NO.:  [id number]   MEDICAL RECORD NO.:  [id number]          PATIENT TYPE:  INP   LOCATION:  3707                         FACILITY:  [facility]   PHYSICIAN:  Valerie A. Asa Lente, MDDATE OF BIRTH:  [birth year]   DATE OF ADMISSION:  07/14/2008  DATE OF DISCHARGE:  07/15/2008                               DISCHARGE SUMMARY   DISCHARGE DIAGNOSES:  1. Chest pain, presumed cardiac, with no evidence of cardiac      abnormality, overnight on telemetry with serial cardiac enzymes,      outpatient stress test as needed.  2. Esophagitis with recent discontinuation of alcohol, no evidence of      thrush, continued daily proton pump inhibitor.  3. Alcohol abuse with cessation approximately 72 hours prior to      admission, no evidence of withdrawal, continue Ativan p.r.n., and      encouraged cessation of beer and other alcohol following discharge      with outpatient followup.  4. Hyponatremia, suspect secondary to heavy beer use prior to      admission with dehydration, resolved, discharge sodium 137.  5. Hypertension, stable, continue home calcium channel blocker.  6. Irritable bowel syndrome with a question of history of left-sided      ulcerative colitis, not currently on medication, outpatient      followup with Gastroenterology as needed.  7. Poor dentition with broken molars on the left side, outpatient      followup with Dentistry and oral surgeon as scheduled prior to      admission.   DISCHARGE MEDICATIONS:  1. Ativan 0.5 mg one to two p.o. q.6 h. p.r.n. nerves, dispensed 30.  2. Nexium 40 mg or substitution okay once daily.  3. Verapamil CD 240 mg once daily.   Hospital followup will be arranged with primary care physician, Dr.  [name].  The patient and his mother instructed to call for an  appointment in the next 4-6 weeks, otherwise, follow up with GI,  previously Dr. Carlean Purl, on an as-needed basis and Dentistry with Oral  Surgery for  repair of dental issues as prior to admission.   CONDITION ON DISCHARGE:  Medically stable and anxious for discharge  home.   HOSPITAL COURSE BY PROBLEM:  1. Chest pain:  The patient is a 55 year old alcoholic, who recently      stopped drinking his daily case of beer 72 hours prior to      presentation, who has been experiencing throat and substernal-type      chest pain and burning for months prior to admission.  He had      reluctance to see his primary care physician and when the mother      called the office requesting an outpatient visit for same, they      referred him to the ER for evaluation of his chest pain.  EKG was      unremarkable for cardiac abnormality and initial cardiac enzymes      were normal, but he was admitted to overnight tele for further      cycling of cardiac  enzymes.  These were negative, and the chest      pain complaints have resolved with continued treatment of daily      proton pump inhibitor for what clinically appears to be esophagitis      with probable component of irritation from alcohol abuse.  No      further cardiac evaluation is warranted at this time, but      outpatient followup to continue risk stratification would be      appropriate.  This has been explained to the patient and his      mother, who agreed to follow up with primary care physician but      preferred to arrange this on their own time given priority of      needing dental repair to help improve his oral intake, which is      already ongoing prior to admission.  I have not prescribed aspirin      at this time due to esophagitis with reflux pain but encouraged      continued proton pump inhibitor, be it Nexium or other suitable      drugs on a daily basis until further followup with primary care      physician can be arranged.  2. Hyponatremia:  The patient had a sodium of 126, also justifying his      need for further hospitalization.  This was likely due in part to      heavy  frequent beer use prior to admission with combined other      dehydration due to poor p.o. intake due to pain in his throat and      mouth.  No evidence of thrush could be identified.  He was treated      with IV normal saline, and over the next 24 hours, this returned to      a normal level at 137.  He is not on any known diuretics prior to      admission or any other history of previous abnormality.  A chest      was negative for any pulmonary abnormalities, and I encouraged him      to follow up with his primary care physician on this to ensure      stability as he remains off beer in the future.  3. Alcohol abuse:  As stated, the patient has recently quit his daily      heavy alcohol use.  He was tremulous and anxious during this      hospitalization but no overt signs of withdrawal or DTs.  He was      treated with p.r.n. Ativan, which he reports could not help him      sleep but did help calm him somewhat in an effort to help him keep      away from resuming alcohol.  Upon discharge, a prescription for a      small amount of Ativan has been prescribed and provided as      described above.  The patient is instructed that he must follow up      with primary care physician or other suitable rehab facility should      he need a refill on this medication.  4. Hypertension:  The patient is continued on at this time Verapamil      as prior to admission without change.      Valerie A. Asa Lente, MD  Electronically Signed     VAL/MEDQ  D:  07/15/2008  T:  07/16/2008  Job:  [id number]

## 2010-10-21 NOTE — Assessment & Plan Note (Signed)
[place]                           GASTROENTEROLOGY OFFICE NOTE   LEM, PEARY                    MRN:          [id number]  DATE:04/03/2006                            DOB:          [birth year]    PROBLEMS:  1. Left-sided ulcerative colitis.  2. Adenomatous colon polyps.  3. Family history of colon cancer.  4. Noncompliance.  5. Family history of ulcerative colitis.  6. Benign prostatic hypertrophy problems.  7. Hypertension.   INTERVAL HISTORY:  Anthony Lamb called complaining of some melena symptoms,  weakness. He has lost weight. He is not eating well. Some of that is because  his dentition is very poor and he is having to have some teeth pulled it  sounds like. He was 153 pounds in April of 2006 and he is 136 pounds now. He  has diarrhea off and on. It is not clear that he ever really started his  sulfasalazine as prescribed last year. He continues to smoke. Crampy lower  quadrant abdominal pain at times as well.  He does not report fever.   PAST MEDICAL HISTORY:  As outlined above.   MEDICATIONS:  1. Verelan 240 mg daily.  2. Prilosec over-the-counter daily.   ALLERGIES:  No known drug allergies.   PHYSICAL EXAMINATION:  GENERAL APPEARANCE:  A thin, chronically ill-  appearing white male in no acute distress.  VITAL SIGNS: Weight 136 pounds. Pulse 88. Blood pressure 150/70.  EYES: Anicteric.  NECK: Supple.  LUNGS: Clear.  HEART: S1, S2, no murmurs.  ABDOMEN: Scaphoid, soft, mildly tender in the lower quadrants without  organomegaly or mass.  RECTAL EXAM: Shows a scant amount of brown stool. The prostate feels mildly  enlarged without nodule. There was no perianal erythema or irritation.  LOWER EXTREMITIES: No edema.  NEURO/PSYCHE: He is alert and oriented x3.   ASSESSMENT:  1. Left-sided ulcerative colitis diagnosed in 2006.  2. Two large pedunculated colon polyps, tubulovillous adenomas removed      last year.  3.  Noncompliance.  4. Weight loss, questioned cause, probably multifactorial but must be      concerned about ulcerative colitis and possibly colon cancer.   PLAN:  1. Colonoscopy.  2. CBC, CMET, TSH.  3. Start sulfasalazine 500 mg t.i.d. and increase to 1000 mg t.i.d. in a      week.  4. Folic acid 1 mg daily.   I have specifically explained to the patient I am concerned about the  possibility of colorectal cancer causing his symptoms.     Anthony Mayer, MD,FACG    CEG/MedQ  DD: 04/03/2006  DT: 04/03/2006  Job #: [id number]   cc:   Darrick Penna. Swords, MD

## 2010-10-25 ENCOUNTER — Telehealth: Payer: Self-pay | Admitting: Internal Medicine

## 2010-10-25 NOTE — Telephone Encounter (Signed)
Patient directed to Medical Records.

## 2010-11-21 ENCOUNTER — Other Ambulatory Visit: Payer: Self-pay | Admitting: *Deleted

## 2010-11-21 ENCOUNTER — Other Ambulatory Visit: Payer: Self-pay | Admitting: Internal Medicine

## 2010-11-21 MED ORDER — VERAPAMIL HCL ER 240 MG PO TBCR: 240.0000 mg | EXTENDED_RELEASE_TABLET | Freq: Every day | ORAL | Status: DC

## 2011-03-06 ENCOUNTER — Other Ambulatory Visit: Payer: Self-pay | Admitting: Internal Medicine

## 2011-03-07 NOTE — Telephone Encounter (Signed)
Please advise 

## 2011-03-08 NOTE — Telephone Encounter (Signed)
Ok for refill  x3

## 2011-07-19 ENCOUNTER — Other Ambulatory Visit: Payer: Self-pay | Admitting: Internal Medicine

## 2011-07-19 NOTE — Telephone Encounter (Signed)
Lorazepam request [last refill 03.19.12 #15x5]

## 2011-09-04 ENCOUNTER — Other Ambulatory Visit: Payer: Self-pay | Admitting: Internal Medicine

## 2011-09-06 NOTE — Telephone Encounter (Signed)
Lorazepam request [last refill 02.13.13 #15x3]

## 2011-09-07 ENCOUNTER — Other Ambulatory Visit: Payer: Self-pay | Admitting: Internal Medicine

## 2011-09-07 NOTE — Telephone Encounter (Signed)
Faxed & phoned in/SLS

## 2011-10-02 ENCOUNTER — Encounter: Payer: Self-pay | Admitting: Internal Medicine

## 2011-12-11 ENCOUNTER — Other Ambulatory Visit: Payer: Self-pay | Admitting: Internal Medicine

## 2012-01-02 ENCOUNTER — Other Ambulatory Visit: Payer: Self-pay | Admitting: Internal Medicine

## 2012-01-02 NOTE — Telephone Encounter (Signed)
Patient request refill Rx on lorazopam. Last OV 08/2010 when came as new patient.

## 2012-01-03 ENCOUNTER — Other Ambulatory Visit: Payer: Self-pay | Admitting: Internal Medicine

## 2012-01-03 NOTE — Telephone Encounter (Signed)
Called medication lorazepam to [facility].  All phone #s in system for patient are disconnected. Unable to reach.

## 2012-03-25 ENCOUNTER — Other Ambulatory Visit: Payer: Self-pay | Admitting: Internal Medicine

## 2012-05-13 ENCOUNTER — Other Ambulatory Visit: Payer: Self-pay | Admitting: Internal Medicine

## 2012-06-13 ENCOUNTER — Other Ambulatory Visit: Payer: Self-pay | Admitting: *Deleted

## 2012-06-13 MED ORDER — LORAZEPAM 0.5 MG PO TABS: 0.5000 mg | ORAL_TABLET | Freq: Four times a day (QID) | ORAL | Status: DC | PRN

## 2012-07-15 ENCOUNTER — Other Ambulatory Visit: Payer: Self-pay | Admitting: Internal Medicine

## 2012-07-15 NOTE — Telephone Encounter (Signed)
No OV for several years. May have a 30 day supple and he needs to have ov before any further medications will be authorized

## 2012-07-17 ENCOUNTER — Emergency Department (HOSPITAL_COMMUNITY)
Admission: EM | Admit: 2012-07-17 | Discharge: 2012-07-17 | Disposition: A | Discharge status: Home / Self Care | Payer: Self-pay | Attending: Emergency Medicine | Admitting: Emergency Medicine

## 2012-07-17 ENCOUNTER — Emergency Department (HOSPITAL_COMMUNITY): Payer: Self-pay

## 2012-07-17 ENCOUNTER — Encounter (HOSPITAL_COMMUNITY): Payer: Self-pay | Admitting: *Deleted

## 2012-07-17 DIAGNOSIS — E119 Type 2 diabetes mellitus without complications: Secondary | ICD-10-CM | POA: Insufficient documentation

## 2012-07-17 DIAGNOSIS — Z79899 Other long term (current) drug therapy: Secondary | ICD-10-CM | POA: Insufficient documentation

## 2012-07-17 DIAGNOSIS — R51 Headache: Secondary | ICD-10-CM | POA: Insufficient documentation

## 2012-07-17 DIAGNOSIS — R110 Nausea: Secondary | ICD-10-CM | POA: Insufficient documentation

## 2012-07-17 DIAGNOSIS — G8929 Other chronic pain: Secondary | ICD-10-CM | POA: Insufficient documentation

## 2012-07-17 DIAGNOSIS — R079 Chest pain, unspecified: Secondary | ICD-10-CM | POA: Insufficient documentation

## 2012-07-17 DIAGNOSIS — I10 Essential (primary) hypertension: Secondary | ICD-10-CM | POA: Insufficient documentation

## 2012-07-17 DIAGNOSIS — R109 Unspecified abdominal pain: Secondary | ICD-10-CM | POA: Insufficient documentation

## 2012-07-17 DIAGNOSIS — K219 Gastro-esophageal reflux disease without esophagitis: Secondary | ICD-10-CM | POA: Insufficient documentation

## 2012-07-17 HISTORY — DX: Gastro-esophageal reflux disease without esophagitis: K21.9

## 2012-07-17 HISTORY — DX: Essential (primary) hypertension: I10

## 2012-07-17 LAB — URINALYSIS, ROUTINE W REFLEX MICROSCOPIC
Ketones, ur: 15 mg/dL — AB
Leukocytes, UA: NEGATIVE
Protein, ur: NEGATIVE mg/dL
Urobilinogen, UA: 0.2 mg/dL (ref 0.0–1.0)

## 2012-07-17 LAB — URINE MICROSCOPIC-ADD ON

## 2012-07-17 LAB — COMPREHENSIVE METABOLIC PANEL
BUN: 4 mg/dL — ABNORMAL LOW (ref 6–23)
CO2: 27 mEq/L (ref 19–32)
Calcium: 9.1 mg/dL (ref 8.4–10.5)
Creatinine, Ser: 0.63 mg/dL (ref 0.50–1.35)
GFR calc Af Amer: >90 mL/min (ref >90)
GFR calc non Af Amer: >90 mL/min (ref >90)
Glucose, Bld: 94 mg/dL (ref 70–99)
Total Bilirubin: 0.4 mg/dL (ref 0.3–1.2)

## 2012-07-17 LAB — CBC WITH DIFFERENTIAL/PLATELET
Eosinophils Relative: 0 % (ref 0–5)
HCT: 37.9 % — ABNORMAL LOW (ref 39.0–52.0)
Hemoglobin: 13.5 g/dL (ref 13.0–17.0)
Lymphocytes Relative: 13 % (ref 12–46)
Lymphs Abs: 1.4 10*3/uL (ref 0.7–4.0)
MCV: 90 fL (ref 78.0–100.0)
Monocytes Absolute: 0.8 10*3/uL (ref 0.1–1.0)
Monocytes Relative: 7 % (ref 3–12)
RBC: 4.21 MIL/uL — ABNORMAL LOW (ref 4.22–5.81)
RDW: 12.9 % (ref 11.5–15.5)
WBC: 11.2 10*3/uL — ABNORMAL HIGH (ref 4.0–10.5)

## 2012-07-17 MED ORDER — GI COCKTAIL ~~LOC~~
30.0000 mL | Freq: Once | ORAL | Status: AC
  Administered 2012-07-17: 30 mL via ORAL
  Filled 2012-07-17: qty 30

## 2012-07-17 MED ORDER — ONDANSETRON HCL 4 MG/2ML IJ SOLN
4.0000 mg | Freq: Once | INTRAMUSCULAR | Status: AC
  Administered 2012-07-17: 4 mg via INTRAVENOUS
  Filled 2012-07-17: qty 2

## 2012-07-17 MED ORDER — SODIUM CHLORIDE 0.9 % IV BOLUS (SEPSIS)
1000.0000 mL | Freq: Once | INTRAVENOUS | Status: AC
  Administered 2012-07-17: 1000 mL via INTRAVENOUS

## 2012-07-17 MED ORDER — ONDANSETRON HCL 4 MG PO TABS: 4.0000 mg | ORAL_TABLET | Freq: Four times a day (QID) | ORAL | Status: DC

## 2012-07-17 NOTE — ED Notes (Signed)
Pt. Reports having abdominal pain , sore throat, rt./ upper chest pain for 1 years.  Pt. States, "I am a drinker. I am an alcoholic, I also smoke."  Pt.'s last drink was this morning.

## 2012-07-17 NOTE — ED Provider Notes (Signed)
History     CSN: [id number]  Arrival date & time 07/17/12  34   First MD Initiated Contact with Patient 07/17/12 1439      Chief Complaint  Patient presents with  . Abdominal Pain  . Headache    (Consider location/radiation/quality/duration/timing/severity/associated sxs/prior treatment) Patient is a 57 y.o. male presenting with abdominal pain and headaches. The history is provided by the patient.  Abdominal Pain Pain location:  RUQ and LUQ Pain quality: burning and sharp   Pain radiates to:  Chest Pain severity:  Moderate Chronicity:  Recurrent Associated symptoms: chest pain and nausea   Associated symptoms: no dysuria, no fever, no shortness of breath and no vomiting   Associated symptoms comment:  He reports upper abdominal pain that is a recurrent episode of chronic pain, stating "because I drink everyday, but I don't know I'm hurting". He reports intermittently bloody diarrhea, not unusual with his history of Ulcerative Colitis. No fever, N, V.  Headache Associated symptoms: abdominal pain and nausea   Associated symptoms: no fever and no vomiting     Past Medical History  Diagnosis Date  . Hypertension   . GERD (gastroesophageal reflux disease)   . Diabetes mellitus without complication     History reviewed. No pertinent past surgical history.  History reviewed. No pertinent family history.  History  Substance Use Topics  . Smoking status: Not on file  . Smokeless tobacco: Not on file  . Alcohol Use: No      Review of Systems  Constitutional: Negative for fever.  Respiratory: Negative for shortness of breath.   Cardiovascular: Positive for chest pain.  Gastrointestinal: Positive for nausea and abdominal pain. Negative for vomiting.  Genitourinary: Negative for dysuria.  Skin: Negative for pallor and rash.  Neurological: Positive for headaches.    Allergies  Diphenhydramine hcl and Nsaids  Home Medications   Current Outpatient Rx  Name  Route   Sig  Dispense  Refill  . esomeprazole (NEXIUM) 40 MG capsule   Oral   Take 40 mg by mouth every evening.         Marland Kitchen LORazepam (ATIVAN) 0.5 MG tablet   Oral   Take 0.5 mg by mouth every 6 (six) hours as needed for anxiety.         . Multiple Vitamin (MULTIVITAMIN WITH MINERALS) TABS   Oral   Take 1 tablet by mouth daily.         . verapamil (CALAN-SR) 240 MG CR tablet   Oral   Take 240 mg by mouth at bedtime.           BP 165/82  Pulse 87  Temp(Src) 98.2 F (36.8 C) (Oral)  Resp 18  SpO2 97%  Physical Exam  Constitutional: He is oriented to person, place, and time. He appears well-developed and well-nourished.  HENT:  Head: Normocephalic.  Mouth/Throat: Mucous membranes are dry.  Neck: Normal range of motion. Neck supple.  Cardiovascular: Normal rate and regular rhythm.   Pulmonary/Chest: Effort normal and breath sounds normal.  Abdominal: Soft. Bowel sounds are normal. There is tenderness. There is no rebound and no guarding.  Diffuse abdominal tenderness without guarding or rebound. Soft abdomen.  Musculoskeletal: Normal range of motion.  Neurological: He is alert and oriented to person, place, and time.  Skin: Skin is warm and dry. No rash noted.  Psychiatric: He has a normal mood and affect.    ED Course  Procedures (including critical care time)  Labs Reviewed - No  data to display No results found. Results for orders placed during the hospital encounter of 07/17/12  CBC WITH DIFFERENTIAL      Result Value Range   WBC 11.2 (*) 4.0 - 10.5 K/uL   RBC 4.21 (*) 4.22 - 5.81 MIL/uL   Hemoglobin 13.5  13.0 - 17.0 g/dL   HCT 37.9 (*) 39.0 - 52.0 %   MCV 90.0  78.0 - 100.0 fL   MCH 32.1  26.0 - 34.0 pg   MCHC 35.6  30.0 - 36.0 g/dL   RDW 12.9  11.5 - 15.5 %   Platelets 331  150 - 400 K/uL   Neutrophils Relative 80 (*) 43 - 77 %   Neutro Abs 9.0 (*) 1.7 - 7.7 K/uL   Lymphocytes Relative 13  12 - 46 %   Lymphs Abs 1.4  0.7 - 4.0 K/uL   Monocytes Relative  7  3 - 12 %   Monocytes Absolute 0.8  0.1 - 1.0 K/uL   Eosinophils Relative 0  0 - 5 %   Eosinophils Absolute 0.0  0.0 - 0.7 K/uL   Basophils Relative 0  0 - 1 %   Basophils Absolute 0.1  0.0 - 0.1 K/uL  COMPREHENSIVE METABOLIC PANEL      Result Value Range   Sodium 130 (*) 135 - 145 mEq/L   Potassium 3.6  3.5 - 5.1 mEq/L   Chloride 91 (*) 96 - 112 mEq/L   CO2 27  19 - 32 mEq/L   Glucose, Bld 94  70 - 99 mg/dL   BUN 4 (*) 6 - 23 mg/dL   Creatinine, Ser 0.63  0.50 - 1.35 mg/dL   Calcium 9.1  8.4 - 10.5 mg/dL   Total Protein 7.3  6.0 - 8.3 g/dL   Albumin 3.6  3.5 - 5.2 g/dL   AST 21  0 - 37 U/L   ALT 16  0 - 53 U/L   Alkaline Phosphatase 90  39 - 117 U/L   Total Bilirubin 0.4  0.3 - 1.2 mg/dL   GFR calc non Af Amer >90  >90 mL/min   GFR calc Af Amer >90  >90 mL/min  LIPASE, BLOOD      Result Value Range   Lipase 20  11 - 59 U/L  URINALYSIS, ROUTINE W REFLEX MICROSCOPIC      Result Value Range   Color, Urine YELLOW  YELLOW   APPearance CLEAR  CLEAR   Specific Gravity, Urine 1.003 (*) 1.005 - 1.030   pH 7.5  5.0 - 8.0   Glucose, UA NEGATIVE  NEGATIVE mg/dL   Hgb urine dipstick MODERATE (*) NEGATIVE   Bilirubin Urine NEGATIVE  NEGATIVE   Ketones, ur 15 (*) NEGATIVE mg/dL   Protein, ur NEGATIVE  NEGATIVE mg/dL   Urobilinogen, UA 0.2  0.0 - 1.0 mg/dL   Nitrite NEGATIVE  NEGATIVE   Leukocytes, UA NEGATIVE  NEGATIVE  URINE MICROSCOPIC-ADD ON      Result Value Range   Squamous Epithelial / LPF RARE  RARE   RBC / HPF 7-10  <3 RBC/hpf   Dg Abd Acute W/chest  07/17/2012  *RADIOLOGY REPORT*  Clinical Data: Abdominal pain  ACUTE ABDOMEN SERIES (ABDOMEN 2 VIEW & CHEST 1 VIEW)  Comparison: 07/14/2008  Findings: Normal heart size and vascularity.  Slightly lower lung volumes with streaky basilar densities in the left lower lobe, suspect atelectasis versus scarring.  No focal pneumonia, collapse, consolidation, edema, effusion, pneumothorax.  Trachea midline. Negative for  free air.   Nonobstructive bowel gas pattern. Scattered air and stool throughout.  Diffuse degenerative changes of the spine.  Bones appear osteopenic.  No acute osseous finding evident.  IMPRESSION: Negative for obstruction or free air.  No acute finding by plain radiography.   Original Report Authenticated By: Jerilynn Mages. Annamaria Boots, M.D.    Date: 07/17/2012  Rate: 81  Rhythm: normal sinus rhythm  QRS Axis: normal  Intervals: normal  ST/T Wave abnormalities: normal  Conduction Disutrbances:right bundle branch block  Narrative Interpretation:   Old EKG Reviewed: unchanged    No diagnosis found.  1. Abdominal pain   MDM  He is active in the room, up walking to and from desk, talking on the phone, NAD. Requesting food and drink. Lab studies are essentially normal without significant abnormalities. VSS. He is stable for discharge and will encourage follow up with GI.        Dewaine Oats, PA-C 07/17/12 2012

## 2012-07-17 NOTE — ED Notes (Signed)
Pt arrived by [facility] for multiple complaints, right side pain, headache, gerd all have been occuring for over one year.

## 2012-07-17 NOTE — ED Notes (Signed)
Pt given [place] sandwich and water per [employer]

## 2012-07-18 NOTE — ED Provider Notes (Signed)
Medical screening examination/treatment/procedure(s) were performed by non-physician practitioner and as supervising physician I was immediately available for consultation/collaboration.   Mylinda Latina III, MD 07/18/12 1106

## 2012-07-22 ENCOUNTER — Other Ambulatory Visit: Payer: Self-pay | Admitting: Internal Medicine

## 2012-08-20 ENCOUNTER — Other Ambulatory Visit: Payer: Self-pay | Admitting: Internal Medicine

## 2012-08-20 NOTE — Telephone Encounter (Signed)
Evidently was a patient of Dr. Leanne Chang, changed to me but has never been seen!Madaline Brilliant for   1 month supply but must have OV before any additional Rx will be filled.

## 2012-08-20 NOTE — Telephone Encounter (Signed)
Called [facility] spoke with alease gava md response. Updated epic...Johny Chess

## 2012-09-04 ENCOUNTER — Encounter: Payer: Self-pay | Admitting: Internal Medicine

## 2012-09-23 ENCOUNTER — Other Ambulatory Visit: Payer: Self-pay | Admitting: Internal Medicine

## 2012-09-23 NOTE — Telephone Encounter (Signed)
Lorazepam called to pharmacy

## 2012-11-04 ENCOUNTER — Other Ambulatory Visit: Payer: Self-pay | Admitting: Internal Medicine

## 2012-11-05 ENCOUNTER — Other Ambulatory Visit: Payer: Self-pay | Admitting: Internal Medicine

## 2012-11-05 NOTE — Telephone Encounter (Addendum)
Lorazepam called to pharmacy

## 2012-12-16 ENCOUNTER — Other Ambulatory Visit: Payer: Self-pay | Admitting: Internal Medicine

## 2012-12-17 ENCOUNTER — Other Ambulatory Visit: Payer: Self-pay | Admitting: Internal Medicine

## 2012-12-17 NOTE — Telephone Encounter (Signed)
Lorazepam called to pharmacy

## 2012-12-17 NOTE — Telephone Encounter (Signed)
This script has already been called in, see previous message

## 2013-01-22 ENCOUNTER — Other Ambulatory Visit: Payer: Self-pay | Admitting: Internal Medicine

## 2013-01-22 NOTE — Telephone Encounter (Signed)
Ok for 4 week supply .Needs ov  - last seen in 2012

## 2013-01-23 ENCOUNTER — Other Ambulatory Visit: Payer: Self-pay | Admitting: Internal Medicine

## 2013-02-24 ENCOUNTER — Other Ambulatory Visit: Payer: Self-pay | Admitting: Internal Medicine

## 2013-02-24 NOTE — Telephone Encounter (Signed)
Lorazepam called to pharmacy

## 2013-03-31 ENCOUNTER — Other Ambulatory Visit: Payer: Self-pay | Admitting: Internal Medicine

## 2013-04-22 ENCOUNTER — Other Ambulatory Visit: Payer: Self-pay | Admitting: Internal Medicine

## 2013-05-02 ENCOUNTER — Other Ambulatory Visit: Payer: Self-pay | Admitting: Internal Medicine

## 2013-05-26 ENCOUNTER — Telehealth: Payer: Self-pay | Admitting: Internal Medicine

## 2013-05-26 NOTE — Telephone Encounter (Signed)
OK if there are any open slots. If no slots can double book tomorrow.

## 2013-05-26 NOTE — Telephone Encounter (Signed)
Scheduled at [place].  Pt is aware and will call back if can't get a ride.

## 2013-05-26 NOTE — Telephone Encounter (Signed)
Pt would like to be worked in this week for a sinus infection and congestion.  LOV was March 2012.

## 2013-05-27 ENCOUNTER — Ambulatory Visit (INDEPENDENT_AMBULATORY_CARE_PROVIDER_SITE_OTHER): Payer: Self-pay | Admitting: Internal Medicine

## 2013-05-27 ENCOUNTER — Other Ambulatory Visit: Payer: Self-pay | Admitting: Internal Medicine

## 2013-05-27 ENCOUNTER — Encounter: Payer: Self-pay | Admitting: Internal Medicine

## 2013-05-27 VITALS — BP 130/76 | HR 83 | Temp 98.3°F | Wt 167.0 lb

## 2013-05-27 DIAGNOSIS — F418 Other specified anxiety disorders: Secondary | ICD-10-CM

## 2013-05-27 DIAGNOSIS — F341 Dysthymic disorder: Secondary | ICD-10-CM

## 2013-05-27 DIAGNOSIS — K219 Gastro-esophageal reflux disease without esophagitis: Secondary | ICD-10-CM

## 2013-05-27 DIAGNOSIS — M549 Dorsalgia, unspecified: Secondary | ICD-10-CM

## 2013-05-27 MED ORDER — HYDROCODONE-ACETAMINOPHEN 10-325 MG PO TABS: 1.0000 | ORAL_TABLET | Freq: Four times a day (QID) | ORAL | Status: DC | PRN

## 2013-05-27 MED ORDER — SERTRALINE HCL 50 MG PO TABS: 50.0000 mg | ORAL_TABLET | Freq: Every day | ORAL | Status: DC

## 2013-05-27 MED ORDER — AMOXICILLIN 875 MG PO TABS: 875.0000 mg | ORAL_TABLET | Freq: Two times a day (BID) | ORAL | Status: DC

## 2013-05-27 MED ORDER — RANITIDINE HCL 150 MG PO TABS: 150.0000 mg | ORAL_TABLET | Freq: Two times a day (BID) | ORAL | Status: DC

## 2013-05-27 NOTE — Progress Notes (Signed)
Pre visit review using our clinic review tool, if applicable. No additional management support is needed unless otherwise documented below in the visit note. 

## 2013-05-27 NOTE — Progress Notes (Signed)
   Subjective:    Patient ID: Anthony Lamb, male    DOB: May 05, 1956, 57 y.o.   MRN: [id number]  HPI Mr. Parekh presents for evaluation of cough productive of green mucus, congestion, weakness and diffuse joint and muscle pain. He also has anxiety with tenseness and worry. He has been having abdominal pain.   Past Medical History  Diagnosis Date  . Hypertension   . GERD (gastroesophageal reflux disease)   . Diabetes mellitus without complication    History reviewed. No pertinent past surgical history. History reviewed. No pertinent family history. History   Social History  . Marital Status: Single    Spouse Name: N/A    Number of Children: N/A  . Years of Education: N/A   Occupational History  . Not on file.   Social History Main Topics  . Smoking status: Former Research scientist (life sciences)  . Smokeless tobacco: Not on file  . Alcohol Use: No  . Drug Use: No  . Sexual Activity: Not on file   Other Topics Concern  . Not on file   Social History Narrative  . No narrative on file       Review of Systems Constitutional:  Positive for fever, chills, activity change and  weight change loss.  HEENT:  Negative for hearing loss, ear pain, congestion, neck stiffness and postnasal drip. Positive for sore throat or swallowing problems. Negative for dental complaints.   Eyes: Negative for vision loss or change in visual acuity.  Respiratory: positive for chest tightness and wheezing. Negative for DOE.   Cardiovascular: Negative for chest pain or palpitations. No decreased exercise tolerance Gastrointestinal: pain across midriff. Blood in the stool - history of ulcerative colitis. No bloating or gas. No reflux or indigestion Genitourinary: Negative for urgency, frequency, flank pain and difficulty urinating.  Musculoskeletal: Negative for myalgias, back pain, arthralgias and gait problem.  Neurological: Negative for dizziness, tremors, weakness and headaches.  Hematological: Negative for  adenopathy.  Psychiatric/Behavioral: Negative for behavioral problems and dysphoric mood.       Objective:   Physical Exam Filed Vitals:   05/27/13 1656  BP: 130/76  Pulse: 83  Temp: 98.3 F (36.8 C)   Wt Readings from Last 3 Encounters:  05/27/13 167 lb (75.751 kg)  08/22/10 162 lb (73.483 kg)  04/19/10 160 lb (72.576 kg)   Gen'l- appears chronically ill. HEENT - very poor dentition, very tender to percussion over the facial sinuses Neck - stiff and tender Cor 2+ radial, RRR Pulm - normal respirations, lungs - CTAP Abd - BS+, poor relaxation but tender to palpation Neuro - A&O x 3       Assessment & Plan:

## 2013-05-27 NOTE — Patient Instructions (Signed)
1. Sinus infection Plan Amoxicillin twice a day for 7 days  Nasal saline wash  Generic sudafed 30 mg twice a day  2. GI -  Gastritis and stomach pain. May also have a flare of colitis with blood in the stool Plan Ranitidine 150 mg twice a day - a relatively inexpensive generic medication for acid suppression  See Dr. Carlean Purl for the colitis  3. Nerves and sleep - lots of stress and anxiety. Plan Sertraline 50 mg once a day - this is also a generic drug.  4. Pain - Limited amount of hydrocodone/APAP 1 every 6 hrs for 10 days  For chronic pain - tylenol 1073m 4 times  You need to look into getting your routine medical care at [facility] [address]. The give good discounts and have medication assistance.

## 2013-05-28 ENCOUNTER — Telehealth: Payer: Self-pay | Admitting: Internal Medicine

## 2013-05-28 MED ORDER — VERAPAMIL HCL ER 240 MG PO TBCR: 240.0000 mg | EXTENDED_RELEASE_TABLET | Freq: Every day | ORAL | Status: DC

## 2013-05-28 NOTE — Telephone Encounter (Signed)
Pt wants his Verapmil called in to a different pharmacy.  [facility] on [address].  He is switching pharmacies.

## 2013-05-28 NOTE — Telephone Encounter (Signed)
Prescription has been sent to [facility] on [address]

## 2013-05-31 ENCOUNTER — Encounter: Payer: Self-pay | Admitting: Internal Medicine

## 2013-05-31 DIAGNOSIS — F418 Other specified anxiety disorders: Secondary | ICD-10-CM | POA: Insufficient documentation

## 2013-05-31 DIAGNOSIS — F339 Major depressive disorder, recurrent, unspecified: Secondary | ICD-10-CM | POA: Insufficient documentation

## 2013-05-31 NOTE — Assessment & Plan Note (Signed)
Gastritis and stomach pain. May also have a flare of colitis with blood in the stool Plan Ranitidine 150 mg twice a day - a relatively inexpensive generic medication for acid suppression  See Dr. Carlean Purl for the colitis

## 2013-05-31 NOTE — Assessment & Plan Note (Signed)
C/o today of worsening back pain.  Plan Limited amount of hydrocodone/APAP 1 every 6 hrs for 10 days  For chronic pain - tylenol 1037m 4 times

## 2013-05-31 NOTE — Assessment & Plan Note (Signed)
His mother, only family and sole support, died 2023/05/19. He is facing destitution with few resources and little ability to cope. He is not sleeping and has constant worry.  Plan Sertraline 50 mg once a day - this is also a generic drug.

## 2013-06-09 ENCOUNTER — Emergency Department (HOSPITAL_COMMUNITY)
Admission: EM | Admit: 2013-06-09 | Discharge: 2013-06-09 | Disposition: A | Discharge status: Home / Self Care | Payer: Self-pay | Attending: Emergency Medicine | Admitting: Emergency Medicine

## 2013-06-09 ENCOUNTER — Encounter (HOSPITAL_COMMUNITY): Payer: Self-pay | Admitting: Emergency Medicine

## 2013-06-09 ENCOUNTER — Emergency Department (HOSPITAL_COMMUNITY): Payer: Self-pay

## 2013-06-09 DIAGNOSIS — R109 Unspecified abdominal pain: Secondary | ICD-10-CM

## 2013-06-09 DIAGNOSIS — Z79899 Other long term (current) drug therapy: Secondary | ICD-10-CM | POA: Insufficient documentation

## 2013-06-09 DIAGNOSIS — I10 Essential (primary) hypertension: Secondary | ICD-10-CM | POA: Insufficient documentation

## 2013-06-09 DIAGNOSIS — R1011 Right upper quadrant pain: Secondary | ICD-10-CM | POA: Insufficient documentation

## 2013-06-09 DIAGNOSIS — K219 Gastro-esophageal reflux disease without esophagitis: Secondary | ICD-10-CM | POA: Insufficient documentation

## 2013-06-09 DIAGNOSIS — E119 Type 2 diabetes mellitus without complications: Secondary | ICD-10-CM | POA: Insufficient documentation

## 2013-06-09 DIAGNOSIS — R1013 Epigastric pain: Secondary | ICD-10-CM | POA: Insufficient documentation

## 2013-06-09 LAB — URINALYSIS, ROUTINE W REFLEX MICROSCOPIC
BILIRUBIN URINE: NEGATIVE
GLUCOSE, UA: NEGATIVE mg/dL
KETONES UR: NEGATIVE mg/dL
Leukocytes, UA: NEGATIVE
Nitrite: NEGATIVE
PROTEIN: NEGATIVE mg/dL
Specific Gravity, Urine: 1.011 (ref 1.005–1.030)
Urobilinogen, UA: 0.2 mg/dL (ref 0.0–1.0)
pH: 8 (ref 5.0–8.0)

## 2013-06-09 LAB — URINE MICROSCOPIC-ADD ON

## 2013-06-09 LAB — CBC WITH DIFFERENTIAL/PLATELET
BASOS ABS: 0.1 10*3/uL (ref 0.0–0.1)
BASOS PCT: 0 % (ref 0–1)
Eosinophils Absolute: 0 10*3/uL (ref 0.0–0.7)
Eosinophils Relative: 0 % (ref 0–5)
HCT: 40.5 % (ref 39.0–52.0)
HEMOGLOBIN: 14.2 g/dL (ref 13.0–17.0)
LYMPHS PCT: 9 % — AB (ref 12–46)
Lymphs Abs: 1.2 10*3/uL (ref 0.7–4.0)
MCH: 31.4 pg (ref 26.0–34.0)
MCHC: 35.1 g/dL (ref 30.0–36.0)
MCV: 89.6 fL (ref 78.0–100.0)
MONOS PCT: 8 % (ref 3–12)
Monocytes Absolute: 1 10*3/uL (ref 0.1–1.0)
NEUTROS ABS: 10.7 10*3/uL — AB (ref 1.7–7.7)
NEUTROS PCT: 83 % — AB (ref 43–77)
Platelets: 378 10*3/uL (ref 150–400)
RBC: 4.52 MIL/uL (ref 4.22–5.81)
RDW: 12.8 % (ref 11.5–15.5)
WBC: 13 10*3/uL — AB (ref 4.0–10.5)

## 2013-06-09 LAB — COMPREHENSIVE METABOLIC PANEL
ALBUMIN: 4.3 g/dL (ref 3.5–5.2)
ALK PHOS: 99 U/L (ref 39–117)
ALT: 19 U/L (ref 0–53)
AST: 18 U/L (ref 0–37)
BILIRUBIN TOTAL: 0.5 mg/dL (ref 0.3–1.2)
BUN: 5 mg/dL — ABNORMAL LOW (ref 6–23)
CHLORIDE: 88 meq/L — AB (ref 96–112)
CO2: 27 mEq/L (ref 19–32)
Calcium: 9.6 mg/dL (ref 8.4–10.5)
Creatinine, Ser: 0.71 mg/dL (ref 0.50–1.35)
GFR calc Af Amer: >90 mL/min (ref >90)
GFR calc non Af Amer: >90 mL/min (ref >90)
Glucose, Bld: 102 mg/dL — ABNORMAL HIGH (ref 70–99)
POTASSIUM: 4.3 meq/L (ref 3.7–5.3)
Sodium: 129 mEq/L — ABNORMAL LOW (ref 137–147)
Total Protein: 7.8 g/dL (ref 6.0–8.3)

## 2013-06-09 LAB — LIPASE, BLOOD: LIPASE: 21 U/L (ref 11–59)

## 2013-06-09 MED ORDER — PANTOPRAZOLE SODIUM 20 MG PO TBEC: 20.0000 mg | DELAYED_RELEASE_TABLET | Freq: Every day | ORAL | Status: DC

## 2013-06-09 MED ORDER — SUCRALFATE 1 G PO TABS: 1.0000 g | ORAL_TABLET | Freq: Four times a day (QID) | ORAL | Status: DC

## 2013-06-09 MED ORDER — PANTOPRAZOLE SODIUM 40 MG PO TBEC
40.0000 mg | DELAYED_RELEASE_TABLET | Freq: Once | ORAL | Status: AC
  Administered 2013-06-09: 40 mg via ORAL
  Filled 2013-06-09: qty 1

## 2013-06-09 MED ORDER — SODIUM CHLORIDE 0.9 % IV SOLN
INTRAVENOUS | Status: DC
  Administered 2013-06-09: 13:00:00 via INTRAVENOUS

## 2013-06-09 MED ORDER — SUCRALFATE 1 G PO TABS
1.0000 g | ORAL_TABLET | Freq: Three times a day (TID) | ORAL | Status: DC
Start: 2013-06-09 — End: 2013-06-09
  Administered 2013-06-09: 1 g via ORAL
  Filled 2013-06-09 (×3): qty 1

## 2013-06-09 MED ORDER — GI COCKTAIL ~~LOC~~
30.0000 mL | Freq: Once | ORAL | Status: AC
  Administered 2013-06-09: 30 mL via ORAL
  Filled 2013-06-09: qty 30

## 2013-06-09 NOTE — ED Notes (Signed)
MD at bedside. 

## 2013-06-09 NOTE — ED Provider Notes (Signed)
CSN: [id number]     Arrival date & time 06/09/13  1156 History   First MD Initiated Contact with Patient 06/09/13 1254     Chief Complaint  Patient presents with  . Abdominal Pain    x6 months feels like he has dif swallowing this is the same sx he has had for a period of time.    (Consider location/radiation/quality/duration/timing/severity/associated sxs/prior Treatment) Patient is a 58 y.o. male presenting with abdominal pain. The history is provided by the patient.  Abdominal Pain  patient here complaining of right upper quadrant and epigastric abdominal pain x6 months. Patient was taking a PPI in the past which did help but due to financial reasons cannot take that medication currently. In the last 24 hours, patient denies any vomiting or black of bloody stools. No fever or chills. Denies any urinary symptoms. Symptoms have been persistent and nothing makes them better or worse. Pain characterized as burning in nature. Saw his physician and was diagnosed gastritis. Patient continues to drink alcohol daily.  Past Medical History  Diagnosis Date  . Hypertension   . GERD (gastroesophageal reflux disease)   . Diabetes mellitus without complication    History reviewed. No pertinent past surgical history. No family history on file. History  Substance Use Topics  . Smoking status: Former Research scientist (life sciences)  . Smokeless tobacco: Never Used  . Alcohol Use: No    Review of Systems  Gastrointestinal: Positive for abdominal pain.  All other systems reviewed and are negative.    Allergies  Diphenhydramine hcl and Nsaids  Home Medications   Current Outpatient Rx  Name  Route  Sig  Dispense  Refill  . amoxicillin (AMOXIL) 875 MG tablet   Oral   Take 1 tablet (875 mg total) by mouth 2 (two) times daily.   14 tablet   0   . esomeprazole (NEXIUM) 40 MG capsule   Oral   Take 40 mg by mouth every evening.         Marland Kitchen HYDROcodone-acetaminophen (NORCO) 10-325 MG per tablet   Oral   Take 1  tablet by mouth every 6 (six) hours as needed.   40 tablet   0   . LORazepam (ATIVAN) 0.5 MG tablet      TAKE ONE TABLET BY MOUTH EVERY 6 HOURS AS NEEDED FOR ANXIETY   120 tablet   0   . Multiple Vitamin (MULTIVITAMIN WITH MINERALS) TABS   Oral   Take 1 tablet by mouth daily.         . ondansetron (ZOFRAN) 4 MG tablet   Oral   Take 1 tablet (4 mg total) by mouth every 6 (six) hours.   12 tablet   0   . ranitidine (ZANTAC) 150 MG tablet   Oral   Take 1 tablet (150 mg total) by mouth 2 (two) times daily.   60 tablet   5   . sertraline (ZOLOFT) 50 MG tablet   Oral   Take 1 tablet (50 mg total) by mouth daily. For anxiety   30 tablet   3   . verapamil (CALAN-SR) 240 MG CR tablet      TAKE ONE TABLET BY MOUTH EVERY DAY   30 tablet   0   . verapamil (CALAN-SR) 240 MG CR tablet   Oral   Take 1 tablet (240 mg total) by mouth at bedtime.   90 tablet   3    BP 150/80  Pulse 84  Temp(Src) 98.6 F (37 C) (  Oral)  Resp 14  SpO2 98% Physical Exam  Nursing note and vitals reviewed. Constitutional: He is oriented to person, place, and time. He appears well-developed and well-nourished.  Non-toxic appearance. No distress.  HENT:  Head: Normocephalic and atraumatic.  Eyes: Conjunctivae, EOM and lids are normal. Pupils are equal, round, and reactive to light.  Neck: Normal range of motion. Neck supple. No tracheal deviation present. No mass present.  Cardiovascular: Normal rate, regular rhythm and normal heart sounds.  Exam reveals no gallop.   No murmur heard. Pulmonary/Chest: Effort normal and breath sounds normal. No stridor. No respiratory distress. He has no decreased breath sounds. He has no wheezes. He has no rhonchi. He has no rales.  Abdominal: Soft. Normal appearance and bowel sounds are normal. He exhibits no distension. There is tenderness in the right upper quadrant and epigastric area. There is no rigidity, no rebound, no guarding and no CVA tenderness.     Musculoskeletal: Normal range of motion. He exhibits no edema and no tenderness.  Neurological: He is alert and oriented to person, place, and time. He has normal strength. No cranial nerve deficit or sensory deficit. GCS eye subscore is 4. GCS verbal subscore is 5. GCS motor subscore is 6.  Skin: Skin is warm and dry. No abrasion and no rash noted.  Psychiatric: He has a normal mood and affect. His speech is normal and behavior is normal.    ED Course  Procedures (including critical care time) Labs Review Labs Reviewed - No data to display Imaging Review No results found.  EKG Interpretation   None       MDM  No diagnosis found.   Patient here treated for likely gastritis. He is stable for discharge and will be placed back on his PPI   Leota Jacobsen, MD 06/09/13 1528

## 2013-06-09 NOTE — Progress Notes (Signed)
[facility] provided pt with a list of self-pay PCPs, [facility] [employer] application, and [facility]CA information.

## 2013-06-09 NOTE — Discharge Instructions (Signed)
Abdominal Pain Abdominal pain can be caused by many things. Your caregiver decides the seriousness of your pain by an examination and possibly blood tests and X-rays. Many cases can be observed and treated at home. Most abdominal pain is not caused by a disease and will probably improve without treatment. However, in many cases, more time must pass before a clear cause of the pain can be found. Before that point, it may not be known if you need more testing, or if hospitalization or surgery is needed. HOME CARE INSTRUCTIONS   Do not take laxatives unless directed by your caregiver.  Take pain medicine only as directed by your caregiver.  Only take over-the-counter or prescription medicines for pain, discomfort, or fever as directed by your caregiver.  Try a clear liquid diet (broth, tea, or water) for as long as directed by your caregiver. Slowly move to a bland diet as tolerated. SEEK IMMEDIATE MEDICAL CARE IF:   The pain does not go away.  You have a fever.  You keep throwing up (vomiting).  The pain is felt only in portions of the abdomen. Pain in the right side could possibly be appendicitis. In an adult, pain in the left lower portion of the abdomen could be colitis or diverticulitis.  You pass bloody or black tarry stools. MAKE SURE YOU:   Understand these instructions.  Will watch your condition.  Will get help right away if you are not doing well or get worse. Document Released: 03/01/2005 Document Revised: 08/14/2011 Document Reviewed: 01/08/2008 [facility] Patient Information 2014 [place].

## 2013-06-16 ENCOUNTER — Telehealth: Payer: Self-pay

## 2013-06-16 NOTE — Telephone Encounter (Signed)
1)Patient advised to continue Pantoprazole. He states his muscles are tightening up and having spasms. So from that should he take it?  2)He requests a refill on Lorazepam

## 2013-06-16 NOTE — Telephone Encounter (Signed)
1. Pantoprazole is not the cause of muscle cramps or spasms. 2. He may take cyclobenzaprine 5 mg every 8 hours as needed for muscle spasms 3. He needs an increase in sertraline to 100 mg once a day for anxiety which should be the primary medication for anxiety. 4. May refill lorazepam for # 60 - no refills. He should take this only as needed for breakthrough anxiety.

## 2013-06-16 NOTE — Telephone Encounter (Signed)
Yes he should take the pantoprazole

## 2013-06-16 NOTE — Telephone Encounter (Signed)
The patient called and stated he would like to discuss his medications with the CMA.  He is concerned about the medicine the hospital gave him to treat his ulcers.

## 2013-06-16 NOTE — Telephone Encounter (Signed)
Phone call to patient. He states he was seen in the ED for stomach problems last week. Was determined he has ulcers. He was given pantoprazole. He questions if he should be taking this. He says his bones are hurting worse. Please advise.

## 2013-06-17 NOTE — Telephone Encounter (Signed)
Phoned patient to let him know MD advise. He states he is not able to pick up any meds due to not having money at this time. I let him know when he does call and I will call in meds. He states he's been having watery diarrhea since this morning. Head and chest congestion and a cough.

## 2013-06-17 NOTE — Telephone Encounter (Signed)
Patient is now requesting a refill of his Hydrocodone that he would like mailed to him so when he has funds and can take the script to the pharmacy

## 2013-06-17 NOTE — Telephone Encounter (Signed)
[facility] is closest to him - if he feels really sick he should go there

## 2013-06-17 NOTE — Telephone Encounter (Signed)
NO! Last office note - was provide #40 hydrocodone for short term use and was told to use APAP for chronic pain management.

## 2013-06-18 ENCOUNTER — Encounter (HOSPITAL_COMMUNITY): Payer: Self-pay | Admitting: Emergency Medicine

## 2013-06-18 ENCOUNTER — Emergency Department (HOSPITAL_COMMUNITY)
Admission: EM | Admit: 2013-06-18 | Discharge: 2013-06-18 | Disposition: A | Discharge status: Home / Self Care | Payer: Self-pay | Attending: Emergency Medicine | Admitting: Emergency Medicine

## 2013-06-18 DIAGNOSIS — E871 Hypo-osmolality and hyponatremia: Secondary | ICD-10-CM | POA: Insufficient documentation

## 2013-06-18 DIAGNOSIS — J029 Acute pharyngitis, unspecified: Secondary | ICD-10-CM | POA: Insufficient documentation

## 2013-06-18 DIAGNOSIS — F411 Generalized anxiety disorder: Secondary | ICD-10-CM | POA: Insufficient documentation

## 2013-06-18 DIAGNOSIS — G2581 Restless legs syndrome: Secondary | ICD-10-CM | POA: Insufficient documentation

## 2013-06-18 DIAGNOSIS — E119 Type 2 diabetes mellitus without complications: Secondary | ICD-10-CM | POA: Insufficient documentation

## 2013-06-18 DIAGNOSIS — K219 Gastro-esophageal reflux disease without esophagitis: Secondary | ICD-10-CM | POA: Insufficient documentation

## 2013-06-18 DIAGNOSIS — Z87891 Personal history of nicotine dependence: Secondary | ICD-10-CM | POA: Insufficient documentation

## 2013-06-18 DIAGNOSIS — G8929 Other chronic pain: Secondary | ICD-10-CM | POA: Insufficient documentation

## 2013-06-18 DIAGNOSIS — I10 Essential (primary) hypertension: Secondary | ICD-10-CM | POA: Insufficient documentation

## 2013-06-18 DIAGNOSIS — M791 Myalgia, unspecified site: Secondary | ICD-10-CM

## 2013-06-18 DIAGNOSIS — IMO0001 Reserved for inherently not codable concepts without codable children: Secondary | ICD-10-CM | POA: Insufficient documentation

## 2013-06-18 DIAGNOSIS — Z79899 Other long term (current) drug therapy: Secondary | ICD-10-CM | POA: Insufficient documentation

## 2013-06-18 LAB — CBC WITH DIFFERENTIAL/PLATELET
Basophils Absolute: 0 10*3/uL (ref 0.0–0.1)
Basophils Relative: 0 % (ref 0–1)
EOS ABS: 0 10*3/uL (ref 0.0–0.7)
EOS PCT: 0 % (ref 0–5)
HCT: 36.8 % — ABNORMAL LOW (ref 39.0–52.0)
HEMOGLOBIN: 13.5 g/dL (ref 13.0–17.0)
LYMPHS ABS: 1.2 10*3/uL (ref 0.7–4.0)
Lymphocytes Relative: 13 % (ref 12–46)
MCH: 31.3 pg (ref 26.0–34.0)
MCHC: 36.7 g/dL — ABNORMAL HIGH (ref 30.0–36.0)
MCV: 85.2 fL (ref 78.0–100.0)
MONOS PCT: 9 % (ref 3–12)
Monocytes Absolute: 0.9 10*3/uL (ref 0.1–1.0)
Neutro Abs: 7.6 10*3/uL (ref 1.7–7.7)
Neutrophils Relative %: 78 % — ABNORMAL HIGH (ref 43–77)
Platelets: 345 10*3/uL (ref 150–400)
RBC: 4.32 MIL/uL (ref 4.22–5.81)
RDW: 12.3 % (ref 11.5–15.5)
WBC: 9.8 10*3/uL (ref 4.0–10.5)

## 2013-06-18 LAB — BASIC METABOLIC PANEL
BUN: 4 mg/dL — ABNORMAL LOW (ref 6–23)
CALCIUM: 9 mg/dL (ref 8.4–10.5)
CO2: 23 mEq/L (ref 19–32)
Chloride: 84 mEq/L — ABNORMAL LOW (ref 96–112)
Creatinine, Ser: 0.6 mg/dL (ref 0.50–1.35)
GFR calc Af Amer: >90 mL/min (ref >90)
GLUCOSE: 109 mg/dL — AB (ref 70–99)
POTASSIUM: 3.7 meq/L (ref 3.7–5.3)
Sodium: 123 mEq/L — ABNORMAL LOW (ref 137–147)

## 2013-06-18 MED ORDER — CYCLOBENZAPRINE HCL 5 MG PO TABS: 5.0000 mg | ORAL_TABLET | Freq: Three times a day (TID) | ORAL | Status: DC | PRN

## 2013-06-18 MED ORDER — LORAZEPAM 0.5 MG PO TABS: 0.5000 mg | ORAL_TABLET | Freq: Four times a day (QID) | ORAL | Status: DC | PRN

## 2013-06-18 MED ORDER — SODIUM CHLORIDE 0.9 % IV BOLUS (SEPSIS)
1000.0000 mL | Freq: Once | INTRAVENOUS | Status: AC
  Administered 2013-06-18: 1000 mL via INTRAVENOUS

## 2013-06-18 MED ORDER — KETOROLAC TROMETHAMINE 30 MG/ML IJ SOLN
30.0000 mg | Freq: Once | INTRAMUSCULAR | Status: AC
  Administered 2013-06-18: 30 mg via INTRAVENOUS
  Filled 2013-06-18: qty 1

## 2013-06-18 MED ORDER — LORAZEPAM 2 MG/ML IJ SOLN
1.0000 mg | Freq: Once | INTRAMUSCULAR | Status: AC
  Administered 2013-06-18: 1 mg via INTRAVENOUS
  Filled 2013-06-18: qty 1

## 2013-06-18 MED ORDER — ONDANSETRON HCL 4 MG/2ML IJ SOLN
4.0000 mg | Freq: Once | INTRAMUSCULAR | Status: AC
  Administered 2013-06-18: 4 mg via INTRAVENOUS
  Filled 2013-06-18: qty 2

## 2013-06-18 MED ORDER — SERTRALINE HCL 100 MG PO TABS: 100.0000 mg | ORAL_TABLET | Freq: Every day | ORAL | Status: DC

## 2013-06-18 NOTE — Progress Notes (Signed)
[facility] provided pt with a list of primary care resources, [facility]CA information, and [place] application.

## 2013-06-18 NOTE — ED Provider Notes (Signed)
Medical screening examination/treatment/procedure(s) were conducted as a shared visit with non-physician practitioner(s) and myself.  I personally evaluated the patient during the encounter.  EKG Interpretation   None       Well appearing. Improved after fluids. Dehydration. Home with pcp follow up  Hoy Morn, MD 06/18/13 [phone]

## 2013-06-18 NOTE — ED Provider Notes (Signed)
CSN: [id number]     Arrival date & time 06/18/13  1133 History   First MD Initiated Contact with Patient 06/18/13 1145     Chief Complaint  Patient presents with  . Dysphagia  . Headache  . Generalized Body Aches   (Consider location/radiation/quality/duration/timing/severity/associated sxs/prior Treatment) HPI Comments: Patient is a 58 year old male with a past medical history of chronic pain, GERD, alcoholism, and hypertension who presents with multiple complaints for the past 3 months. Patient complains of sore throat, headache, myalgias, and restless legs. Symptoms started gradually and progressively worsened since the onset. Patient has not tried anything for pain and has seen his PCP with request for hydrocodone which helps with some of the pain. Patient denies any sick contacts. No aggravating/alleviating factors. Patient reports "quitting drugs" 3 months ago and no new changes. Patient was seen in the ED 9 days ago and did not complain of any of the above symptoms but did reports abdominal pain.    Past Medical History  Diagnosis Date  . Hypertension   . GERD (gastroesophageal reflux disease)   . Diabetes mellitus without complication    History reviewed. No pertinent past surgical history. No family history on file. History  Substance Use Topics  . Smoking status: Former Research scientist (life sciences)  . Smokeless tobacco: Never Used  . Alcohol Use: No    Review of Systems  Constitutional: Negative for fever, chills and fatigue.  HENT: Positive for sore throat. Negative for trouble swallowing.   Eyes: Negative for visual disturbance.  Respiratory: Negative for shortness of breath.   Cardiovascular: Negative for chest pain and palpitations.  Gastrointestinal: Negative for nausea, vomiting, abdominal pain and diarrhea.  Genitourinary: Negative for dysuria and difficulty urinating.  Musculoskeletal: Positive for myalgias. Negative for arthralgias and neck pain.  Skin: Negative for color change.   Neurological: Positive for headaches. Negative for dizziness and weakness.  Psychiatric/Behavioral: Negative for dysphoric mood.    Allergies  Diphenhydramine hcl and Nsaids  Home Medications   Current Outpatient Rx  Name  Route  Sig  Dispense  Refill  . acetaminophen (TYLENOL) 500 MG tablet   Oral   Take 1,000 mg by mouth every 6 (six) hours as needed for headache.         Marland Kitchen HYDROcodone-acetaminophen (NORCO) 10-325 MG per tablet   Oral   Take 1 tablet by mouth every 6 (six) hours as needed.   40 tablet   0   . LORazepam (ATIVAN) 0.5 MG tablet   Oral   Take 1 tablet (0.5 mg total) by mouth every 6 (six) hours as needed for anxiety. FOR BREAKTHROUGH ANXIETY   60 tablet   0   . Multiple Vitamin (MULTIVITAMIN WITH MINERALS) TABS   Oral   Take 1 tablet by mouth daily.         . pantoprazole (PROTONIX) 20 MG tablet   Oral   Take 1 tablet (20 mg total) by mouth daily.   30 tablet   0   . sertraline (ZOLOFT) 100 MG tablet   Oral   Take 1 tablet (100 mg total) by mouth daily. FOR ANXIETY   30 tablet   0   . verapamil (CALAN-SR) 240 MG CR tablet   Oral   Take 240 mg by mouth every evening.         . cyclobenzaprine (FLEXERIL) 5 MG tablet   Oral   Take 1 tablet (5 mg total) by mouth every 8 (eight) hours as needed for muscle  spasms.   30 tablet   0    BP 153/94  Pulse 96  Temp(Src) 98.5 F (36.9 C) (Oral)  Resp 14  SpO2 98% Physical Exam  Nursing note and vitals reviewed. Constitutional: He is oriented to person, place, and time. He appears well-developed and well-nourished. No distress.  HENT:  Head: Normocephalic and atraumatic.  Mouth/Throat: Oropharynx is clear and moist. No oropharyngeal exudate.  Eyes: Conjunctivae and EOM are normal. Pupils are equal, round, and reactive to light.  Neck: Normal range of motion.  Cardiovascular: Normal rate and regular rhythm.  Exam reveals no gallop and no friction rub.   No murmur heard. Pulmonary/Chest:  Effort normal and breath sounds normal. He has no wheezes. He has no rales. He exhibits no tenderness.  Abdominal: Soft. He exhibits no distension. There is no tenderness. There is no rebound and no guarding.  No tenderness to palpation or peritoneal signs.   Musculoskeletal: Normal range of motion.  Lymphadenopathy:    He has no cervical adenopathy.  Neurological: He is alert and oriented to person, place, and time. Coordination normal.  Speech is goal-oriented. Moves limbs without ataxia.   Skin: Skin is warm and dry.  Psychiatric:  Patient appears anxious.     ED Course  Procedures (including critical care time) Labs Review Labs Reviewed  CBC WITH DIFFERENTIAL - Abnormal; Notable for the following:    HCT 36.8 (*)    MCHC 36.7 (*)    Neutrophils Relative % 78 (*)    All other components within normal limits  BASIC METABOLIC PANEL - Abnormal; Notable for the following:    Sodium 123 (*)    Chloride 84 (*)    Glucose, Bld 109 (*)    BUN 4 (*)    All other components within normal limits   Imaging Review No results found.  EKG Interpretation   None       MDM   1. Myalgia   2. Hyponatremia   3. Chronic pain     2:55 PM Labs show hypokalemia, which seems to be a trend of the patient, however, this is the lower value. Patient's hypokalemia likely due to Zoloft and alcoholism. Patient given toradol and ativan for symptoms. Vitals stable and patient afebrile. Patient feeling better and will be discharged with instructions to follow up with his PCP. No further evaluation needed at this time. Patient instructed to return to the ED with worsening or concerning symptoms.     Alvina Chou, PA-C 06/18/13 1553

## 2013-06-18 NOTE — ED Provider Notes (Signed)
Called to the patient's room after nurse try to discharge the patient. He had been discharged by the previous doctor. He was complaining of pain in the right leg, asking him to have a blood clot. Examination revealed spasm of the gastrocnemius muscle, no swelling. No venous cords. Negative Homans sign. Patient was informed that this was unlikely to be a clot. I did offer to perform an ultrasound, but did not want to wait the length of time that would take. I asked him if he would like to have the test scheduled for the following day, the patient said he would see his primary care doctor.  Orpah Greek, MD 06/19/13 [phone]

## 2013-06-18 NOTE — ED Notes (Signed)
Bed: WA06 Expected date:  Expected time:  Means of arrival:  Comments: EMS 

## 2013-06-18 NOTE — Telephone Encounter (Signed)
Patient has been advised

## 2013-06-18 NOTE — ED Notes (Addendum)
Per EMS: Pt has multiple complaints of headache, tremors, generalized joint and bone paint, nausea, diarrhea, swallowing difficulties, and no appetite which has been occuring over the past three months. Pt states that all of these symptoms has worsened over the past three days. Pt is A/O.

## 2013-06-18 NOTE — Discharge Instructions (Signed)
Follow up with Dr. Linda Hedges for further evaluation and management of your symptoms. Refer to attached documents for more information. Return to the ED with worsening or concerning symptoms.

## 2013-06-19 ENCOUNTER — Telehealth: Payer: Self-pay

## 2013-06-19 NOTE — Telephone Encounter (Signed)
Phone call from patient stating he's had yellow diarrhea x 2 days and feeling weak.

## 2013-06-19 NOTE — Telephone Encounter (Signed)
Seen and treated yesterday, 06/18/13, in the ED. He should follow their instructions. May be seen Monday or Tuesday for follow up.

## 2013-06-19 NOTE — Telephone Encounter (Signed)
Spoke with pt, he states his symptoms are getting worse.  He states he is going back to the ED further states he is unable to eat.  Please advise

## 2013-06-20 ENCOUNTER — Emergency Department (HOSPITAL_COMMUNITY): Payer: Self-pay

## 2013-06-20 ENCOUNTER — Encounter (HOSPITAL_COMMUNITY): Payer: Self-pay | Admitting: Emergency Medicine

## 2013-06-20 ENCOUNTER — Inpatient Hospital Stay (HOSPITAL_COMMUNITY)
Admission: EM | Admit: 2013-06-20 | Discharge: 2013-06-22 | DRG: 644 | Disposition: A | Discharge status: Home / Self Care | Payer: Self-pay | Attending: Internal Medicine | Admitting: Internal Medicine

## 2013-06-20 DIAGNOSIS — K279 Peptic ulcer, site unspecified, unspecified as acute or chronic, without hemorrhage or perforation: Secondary | ICD-10-CM | POA: Diagnosis present

## 2013-06-20 DIAGNOSIS — E871 Hypo-osmolality and hyponatremia: Secondary | ICD-10-CM

## 2013-06-20 DIAGNOSIS — F339 Major depressive disorder, recurrent, unspecified: Secondary | ICD-10-CM | POA: Diagnosis present

## 2013-06-20 DIAGNOSIS — F10939 Alcohol use, unspecified with withdrawal, unspecified: Secondary | ICD-10-CM

## 2013-06-20 DIAGNOSIS — F418 Other specified anxiety disorders: Secondary | ICD-10-CM

## 2013-06-20 DIAGNOSIS — E440 Moderate protein-calorie malnutrition: Secondary | ICD-10-CM | POA: Diagnosis present

## 2013-06-20 DIAGNOSIS — F411 Generalized anxiety disorder: Secondary | ICD-10-CM | POA: Diagnosis present

## 2013-06-20 DIAGNOSIS — F341 Dysthymic disorder: Secondary | ICD-10-CM

## 2013-06-20 DIAGNOSIS — K2940 Chronic atrophic gastritis without bleeding: Secondary | ICD-10-CM | POA: Diagnosis present

## 2013-06-20 DIAGNOSIS — R519 Headache, unspecified: Secondary | ICD-10-CM

## 2013-06-20 DIAGNOSIS — H532 Diplopia: Secondary | ICD-10-CM | POA: Diagnosis present

## 2013-06-20 DIAGNOSIS — E236 Other disorders of pituitary gland: Principal | ICD-10-CM | POA: Diagnosis present

## 2013-06-20 DIAGNOSIS — K209 Esophagitis, unspecified without bleeding: Secondary | ICD-10-CM | POA: Diagnosis present

## 2013-06-20 DIAGNOSIS — N400 Enlarged prostate without lower urinary tract symptoms: Secondary | ICD-10-CM

## 2013-06-20 DIAGNOSIS — E222 Syndrome of inappropriate secretion of antidiuretic hormone: Secondary | ICD-10-CM | POA: Diagnosis present

## 2013-06-20 DIAGNOSIS — K0510 Chronic gingivitis, plaque induced: Secondary | ICD-10-CM | POA: Diagnosis present

## 2013-06-20 DIAGNOSIS — I959 Hypotension, unspecified: Secondary | ICD-10-CM | POA: Diagnosis present

## 2013-06-20 DIAGNOSIS — F329 Major depressive disorder, single episode, unspecified: Secondary | ICD-10-CM | POA: Diagnosis present

## 2013-06-20 DIAGNOSIS — Z86010 Personal history of colonic polyps: Secondary | ICD-10-CM

## 2013-06-20 DIAGNOSIS — F3289 Other specified depressive episodes: Secondary | ICD-10-CM | POA: Diagnosis present

## 2013-06-20 DIAGNOSIS — R197 Diarrhea, unspecified: Secondary | ICD-10-CM | POA: Diagnosis present

## 2013-06-20 DIAGNOSIS — Z79899 Other long term (current) drug therapy: Secondary | ICD-10-CM

## 2013-06-20 DIAGNOSIS — F1020 Alcohol dependence, uncomplicated: Secondary | ICD-10-CM | POA: Diagnosis present

## 2013-06-20 DIAGNOSIS — R51 Headache: Secondary | ICD-10-CM | POA: Diagnosis present

## 2013-06-20 DIAGNOSIS — K589 Irritable bowel syndrome without diarrhea: Secondary | ICD-10-CM | POA: Diagnosis present

## 2013-06-20 DIAGNOSIS — M62838 Other muscle spasm: Secondary | ICD-10-CM | POA: Diagnosis present

## 2013-06-20 DIAGNOSIS — M549 Dorsalgia, unspecified: Secondary | ICD-10-CM

## 2013-06-20 DIAGNOSIS — I10 Essential (primary) hypertension: Secondary | ICD-10-CM | POA: Diagnosis present

## 2013-06-20 DIAGNOSIS — Z9119 Patient's noncompliance with other medical treatment and regimen: Secondary | ICD-10-CM

## 2013-06-20 DIAGNOSIS — I498 Other specified cardiac arrhythmias: Secondary | ICD-10-CM | POA: Diagnosis present

## 2013-06-20 DIAGNOSIS — Z87891 Personal history of nicotine dependence: Secondary | ICD-10-CM

## 2013-06-20 DIAGNOSIS — R627 Adult failure to thrive: Secondary | ICD-10-CM | POA: Diagnosis present

## 2013-06-20 DIAGNOSIS — F10239 Alcohol dependence with withdrawal, unspecified: Secondary | ICD-10-CM

## 2013-06-20 DIAGNOSIS — Z91199 Patient's noncompliance with other medical treatment and regimen due to unspecified reason: Secondary | ICD-10-CM

## 2013-06-20 DIAGNOSIS — J029 Acute pharyngitis, unspecified: Secondary | ICD-10-CM | POA: Diagnosis present

## 2013-06-20 DIAGNOSIS — IMO0002 Reserved for concepts with insufficient information to code with codable children: Secondary | ICD-10-CM

## 2013-06-20 DIAGNOSIS — Z8249 Family history of ischemic heart disease and other diseases of the circulatory system: Secondary | ICD-10-CM

## 2013-06-20 DIAGNOSIS — M459 Ankylosing spondylitis of unspecified sites in spine: Secondary | ICD-10-CM

## 2013-06-20 DIAGNOSIS — K219 Gastro-esophageal reflux disease without esophagitis: Secondary | ICD-10-CM | POA: Diagnosis present

## 2013-06-20 HISTORY — DX: Major depressive disorder, single episode, unspecified: F32.9

## 2013-06-20 HISTORY — DX: Esophagitis, unspecified: K20.9

## 2013-06-20 HISTORY — DX: Alcohol abuse, in remission: F10.11

## 2013-06-20 HISTORY — DX: Noninfective gastroenteritis and colitis, unspecified: K52.9

## 2013-06-20 HISTORY — DX: Other chronic pain: G89.29

## 2013-06-20 HISTORY — DX: Depression, unspecified: F32.A

## 2013-06-20 HISTORY — DX: Esophagitis, unspecified without bleeding: K20.90

## 2013-06-20 HISTORY — DX: Disorder of teeth and supporting structures, unspecified: K08.9

## 2013-06-20 HISTORY — DX: Irritable bowel syndrome, unspecified: K58.9

## 2013-06-20 HISTORY — DX: Gastritis, unspecified, without bleeding: K29.70

## 2013-06-20 HISTORY — DX: Anxiety disorder, unspecified: F41.9

## 2013-06-20 LAB — URINALYSIS, ROUTINE W REFLEX MICROSCOPIC
BILIRUBIN URINE: NEGATIVE
GLUCOSE, UA: NEGATIVE mg/dL
KETONES UR: 15 mg/dL — AB
Leukocytes, UA: NEGATIVE
Nitrite: NEGATIVE
PH: 7.5 (ref 5.0–8.0)
PROTEIN: NEGATIVE mg/dL
Specific Gravity, Urine: 1.013 (ref 1.005–1.030)
Urobilinogen, UA: 0.2 mg/dL (ref 0.0–1.0)

## 2013-06-20 LAB — CBC WITH DIFFERENTIAL/PLATELET
Basophils Absolute: 0 10*3/uL (ref 0.0–0.1)
Basophils Relative: 0 % (ref 0–1)
EOS PCT: 0 % (ref 0–5)
Eosinophils Absolute: 0 10*3/uL (ref 0.0–0.7)
HEMATOCRIT: 34.8 % — AB (ref 39.0–52.0)
Hemoglobin: 13.3 g/dL (ref 13.0–17.0)
LYMPHS ABS: 0.9 10*3/uL (ref 0.7–4.0)
Lymphocytes Relative: 10 % — ABNORMAL LOW (ref 12–46)
MCH: 31.8 pg (ref 26.0–34.0)
MCHC: 37 g/dL — ABNORMAL HIGH (ref 30.0–36.0)
MCV: 83.3 fL (ref 78.0–100.0)
MONO ABS: 1 10*3/uL (ref 0.1–1.0)
Monocytes Relative: 11 % (ref 3–12)
NEUTROS ABS: 7.3 10*3/uL (ref 1.7–7.7)
Neutrophils Relative %: 79 % — ABNORMAL HIGH (ref 43–77)
PLATELETS: 357 10*3/uL (ref 150–400)
RBC: 4.18 MIL/uL — ABNORMAL LOW (ref 4.22–5.81)
RDW: 12.3 % (ref 11.5–15.5)
WBC: 9.2 10*3/uL (ref 4.0–10.5)

## 2013-06-20 LAB — RAPID URINE DRUG SCREEN, HOSP PERFORMED
Amphetamines: NOT DETECTED
BARBITURATES: NOT DETECTED
Benzodiazepines: NOT DETECTED
Cocaine: NOT DETECTED
OPIATES: NOT DETECTED
Tetrahydrocannabinol: NOT DETECTED

## 2013-06-20 LAB — SEDIMENTATION RATE: SED RATE: 25 mm/h — AB (ref 0–16)

## 2013-06-20 LAB — BASIC METABOLIC PANEL
BUN: 4 mg/dL — AB (ref 6–23)
BUN: 3 mg/dL — ABNORMAL LOW (ref 6–23)
CALCIUM: 8.9 mg/dL (ref 8.4–10.5)
CALCIUM: 8.9 mg/dL (ref 8.4–10.5)
CHLORIDE: 83 meq/L — AB (ref 96–112)
CO2: 25 meq/L (ref 19–32)
CO2: 27 mEq/L (ref 19–32)
CREATININE: 0.61 mg/dL (ref 0.50–1.35)
CREATININE: 0.59 mg/dL (ref 0.50–1.35)
Chloride: 78 mEq/L — ABNORMAL LOW (ref 96–112)
GFR calc Af Amer: >90 mL/min (ref >90)
GFR calc Af Amer: >90 mL/min (ref >90)
GFR calc non Af Amer: >90 mL/min (ref >90)
GFR calc non Af Amer: >90 mL/min (ref >90)
GLUCOSE: 117 mg/dL — AB (ref 70–99)
Glucose, Bld: 108 mg/dL — ABNORMAL HIGH (ref 70–99)
Potassium: 3.7 mEq/L (ref 3.7–5.3)
Potassium: 3.7 mEq/L (ref 3.7–5.3)
SODIUM: 124 meq/L — AB (ref 137–147)
Sodium: 119 mEq/L — CL (ref 137–147)

## 2013-06-20 LAB — ETHANOL: Alcohol, Ethyl (B): <11 mg/dL (ref 0–11)

## 2013-06-20 LAB — C-REACTIVE PROTEIN: CRP: <0.5 mg/dL — ABNORMAL LOW (ref <0.60)

## 2013-06-20 LAB — URINE MICROSCOPIC-ADD ON

## 2013-06-20 LAB — PHOSPHORUS: PHOSPHORUS: 3 mg/dL (ref 2.3–4.6)

## 2013-06-20 LAB — TSH: TSH: 0.826 u[IU]/mL (ref 0.350–4.500)

## 2013-06-20 LAB — MAGNESIUM: Magnesium: 1.8 mg/dL (ref 1.5–2.5)

## 2013-06-20 LAB — RAPID STREP SCREEN (MED CTR MEBANE ONLY): Streptococcus, Group A Screen (Direct): NEGATIVE

## 2013-06-20 LAB — TROPONIN I: Troponin I: <0.3 ng/mL (ref <0.30)

## 2013-06-20 LAB — OSMOLALITY: OSMOLALITY: 245 mosm/kg — AB (ref 275–300)

## 2013-06-20 MED ORDER — ENOXAPARIN SODIUM 40 MG/0.4ML ~~LOC~~ SOLN
40.0000 mg | SUBCUTANEOUS | Status: DC
  Administered 2013-06-20 – 2013-06-21 (×2): 40 mg via SUBCUTANEOUS
  Filled 2013-06-20 (×3): qty 0.4

## 2013-06-20 MED ORDER — VITAMIN B-1 100 MG PO TABS
100.0000 mg | ORAL_TABLET | Freq: Every day | ORAL | Status: DC
  Administered 2013-06-20 – 2013-06-22 (×3): 100 mg via ORAL
  Filled 2013-06-20 (×3): qty 1

## 2013-06-20 MED ORDER — ADULT MULTIVITAMIN W/MINERALS CH
1.0000 | ORAL_TABLET | Freq: Every day | ORAL | Status: DC
  Administered 2013-06-20 – 2013-06-22 (×3): 1 via ORAL
  Filled 2013-06-20 (×3): qty 1

## 2013-06-20 MED ORDER — LORAZEPAM 1 MG PO TABS: 0.0000 mg | ORAL_TABLET | Freq: Two times a day (BID) | ORAL | Status: DC

## 2013-06-20 MED ORDER — HYDROXYZINE HCL 25 MG PO TABS
25.0000 mg | ORAL_TABLET | Freq: Four times a day (QID) | ORAL | Status: DC
  Administered 2013-06-20 – 2013-06-21 (×3): 25 mg via ORAL
  Filled 2013-06-20 (×6): qty 1

## 2013-06-20 MED ORDER — ONDANSETRON HCL 4 MG/2ML IJ SOLN: 4.0000 mg | Freq: Four times a day (QID) | INTRAMUSCULAR | Status: DC | PRN

## 2013-06-20 MED ORDER — LIDOCAINE VISCOUS 2 % MT SOLN
15.0000 mL | OROMUCOSAL | Status: DC | PRN
  Filled 2013-06-20: qty 15

## 2013-06-20 MED ORDER — ZOLPIDEM TARTRATE 10 MG PO TABS
10.0000 mg | ORAL_TABLET | Freq: Every evening | ORAL | Status: DC | PRN
  Administered 2013-06-20 – 2013-06-22 (×3): 10 mg via ORAL
  Filled 2013-06-20 (×3): qty 1

## 2013-06-20 MED ORDER — HYDROCODONE-ACETAMINOPHEN 10-325 MG PO TABS
1.0000 | ORAL_TABLET | Freq: Four times a day (QID) | ORAL | Status: DC | PRN
  Administered 2013-06-20 – 2013-06-22 (×7): 1 via ORAL
  Filled 2013-06-20 (×7): qty 1

## 2013-06-20 MED ORDER — FOLIC ACID 1 MG PO TABS
1.0000 mg | ORAL_TABLET | Freq: Every day | ORAL | Status: DC
  Administered 2013-06-20 – 2013-06-22 (×3): 1 mg via ORAL
  Filled 2013-06-20 (×3): qty 1

## 2013-06-20 MED ORDER — SODIUM CHLORIDE 0.9 % IJ SOLN
3.0000 mL | Freq: Two times a day (BID) | INTRAMUSCULAR | Status: DC
  Administered 2013-06-21 (×2): 3 mL via INTRAVENOUS

## 2013-06-20 MED ORDER — MAGNESIUM SULFATE 40 MG/ML IJ SOLN
2.0000 g | Freq: Once | INTRAMUSCULAR | Status: AC
  Administered 2013-06-20: 2 g via INTRAVENOUS
  Filled 2013-06-20: qty 50

## 2013-06-20 MED ORDER — LORAZEPAM 1 MG PO TABS
0.0000 mg | ORAL_TABLET | Freq: Four times a day (QID) | ORAL | Status: DC
Start: 2013-06-20 — End: 2013-06-22
  Administered 2013-06-20 – 2013-06-21 (×2): 1 mg via ORAL
  Administered 2013-06-21: 2 mg via ORAL
  Administered 2013-06-22: 1 mg via ORAL
  Filled 2013-06-20: qty 1
  Filled 2013-06-20: qty 2
  Filled 2013-06-20 (×2): qty 1

## 2013-06-20 MED ORDER — CHLORHEXIDINE GLUCONATE 0.12 % MT SOLN
15.0000 mL | Freq: Two times a day (BID) | OROMUCOSAL | Status: DC
  Administered 2013-06-20 – 2013-06-22 (×4): 15 mL via OROMUCOSAL
  Filled 2013-06-20 (×5): qty 15

## 2013-06-20 MED ORDER — ACETAMINOPHEN 500 MG PO TABS
1000.0000 mg | ORAL_TABLET | Freq: Four times a day (QID) | ORAL | Status: DC | PRN
  Administered 2013-06-20: 1000 mg via ORAL
  Filled 2013-06-20: qty 2

## 2013-06-20 MED ORDER — THIAMINE HCL 100 MG/ML IJ SOLN
100.0000 mg | Freq: Every day | INTRAMUSCULAR | Status: DC
  Filled 2013-06-20 (×3): qty 1

## 2013-06-20 MED ORDER — VERAPAMIL HCL ER 240 MG PO TBCR
240.0000 mg | EXTENDED_RELEASE_TABLET | Freq: Every day | ORAL | Status: DC
  Administered 2013-06-20: 240 mg via ORAL
  Filled 2013-06-20 (×2): qty 1

## 2013-06-20 MED ORDER — HYDROMORPHONE HCL PF 1 MG/ML IJ SOLN: 1.0000 mg | Freq: Once | INTRAMUSCULAR | Status: DC

## 2013-06-20 MED ORDER — ENSURE PUDDING PO PUDG
1.0000 | Freq: Three times a day (TID) | ORAL | Status: DC
  Administered 2013-06-21 – 2013-06-22 (×3): 1 via ORAL
  Filled 2013-06-20 (×7): qty 1

## 2013-06-20 MED ORDER — PANTOPRAZOLE SODIUM 40 MG PO TBEC
40.0000 mg | DELAYED_RELEASE_TABLET | Freq: Two times a day (BID) | ORAL | Status: DC
  Administered 2013-06-21 – 2013-06-22 (×3): 40 mg via ORAL
  Filled 2013-06-20 (×5): qty 1

## 2013-06-20 MED ORDER — CELECOXIB 100 MG PO CAPS
100.0000 mg | ORAL_CAPSULE | Freq: Two times a day (BID) | ORAL | Status: DC
  Administered 2013-06-20 – 2013-06-22 (×4): 100 mg via ORAL
  Filled 2013-06-20 (×5): qty 1

## 2013-06-20 MED ORDER — SODIUM CHLORIDE 0.9 % IV SOLN
INTRAVENOUS | Status: AC
  Administered 2013-06-20 (×2): via INTRAVENOUS

## 2013-06-20 MED ORDER — ONDANSETRON HCL 4 MG PO TABS: 4.0000 mg | ORAL_TABLET | Freq: Four times a day (QID) | ORAL | Status: DC | PRN

## 2013-06-20 NOTE — ED Notes (Signed)
Patient here 2 days ago with same symptoms of generalized pain including chest pain, nausea, diarrhea, weakness, loss of appetite, and not able to sleep, sore throat, abdomen hurts also.  Denies vomiting, and no fever.

## 2013-06-20 NOTE — ED Notes (Signed)
Bed: WA17 Expected date:  Expected time:  Means of arrival:  Comments: ems- male, non cardiac chest pain

## 2013-06-20 NOTE — Progress Notes (Signed)
INITIAL NUTRITION ASSESSMENT  DOCUMENTATION CODES Per approved criteria  -Non-severe (moderate) malnutrition in the context of social or environmental circumstances  Pt meets criteria for moderate MALNUTRITION in the context of social/environmental circumstances as evidenced by PO intake < 75% for > 3 months, moderate muscle and body fat loss in lower and upper extremities.   INTERVENTION: -Recommend Ensure Pudding po TID, each supplement provides 170 kcal and 4 grams of protein -Recommend PM nourishment -Consider SLP evaluation d/t self-reported difficulty swallowing  NUTRITION DIAGNOSIS: Inadequate oral intake related to decreased appetite/finances as evidenced by PO intake < 75% est nutrition needs.   Goal: Pt to meet >/= 90% of their estimated nutrition needs    Monitor:  Total protein/energy intake, GI profile, Swallow profile, labs, weights  Reason for Assessment: Malnutrition Screening Tool  58 y.o. male  Admitting Dx: Hyponatremia  ASSESSMENT: The patient is a 58 y.o. year-old male with history of HTN, depression, anxiety, EtOH abuse and chronic gastritis/esophagitis, chronic pain, IBS, colitis, poor dentition who presents with the aforementioned complaints  -Pt w/poor appetite for several months -Noted weight loss had occurred, but was unable to quantify amount, likely > 10 lbs -Eats one meal/day. Has difficulty tolerating foods r/t esophagitis and poor dentition. N/v and diarrhea also likely contributing to poor nutritional quality pta -MD noted pt with poor living situation, is unable to acquire meal on his own and family/friends will intermittently provide him with food -Currently on fluid restriction of 1200 ml. PO intake 50% of lunch. Was willing to try Ensure pudding and PM nourishment  Nutrition Focused Physical Exam:  Subcutaneous Fat:  Orbital Region: WNL Upper Arm Region: moderate Thoracic and Lumbar Region: WNL  Muscle:  Temple Region: WNL Clavicle  Bone Region: WNL Clavicle and Acromion Bone Region: moderate Scapular Bone Region: moderate Dorsal Hand: WNL Patellar Region: WNL Anterior Thigh Region: moderate Posterior Calf Region: moderate  Edema: N/A    Height: Ht Readings from Last 1 Encounters:  06/20/13 5' 11"  (1.803 m)    Weight: Wt Readings from Last 1 Encounters:  06/20/13 163 lb 1.6 oz (73.982 kg)    Ideal Body Weight: 172 lbs  % Ideal Body Weight: 95%  Wt Readings from Last 10 Encounters:  06/20/13 163 lb 1.6 oz (73.982 kg)  05/27/13 167 lb (75.751 kg)  08/22/10 162 lb (73.483 kg)  04/19/10 160 lb (72.576 kg)  11/17/09 153 lb (69.4 kg)  12/01/08 137 lb (62.143 kg)  08/18/08 134 lb (60.782 kg)  08/13/08 134 lb 6.1 oz (60.955 kg)  07/17/07 134 lb (60.782 kg)    Usual Body Weight: 173 lbs  % Usual Body Weight: 94%  BMI:  Body mass index is 22.76 kg/(m^2). Normal weight  Estimated Nutritional Needs: Kcal: 1850-2050 Protein: 75-90 gram Fluid: 1200 ml  Skin: WDL  Diet Order: Cardiac 1200 ml  EDUCATION NEEDS: -No education needs identified at this time   Intake/Output Summary (Last 24 hours) at 06/20/13 1806 Last data filed at 06/20/13 1500  Gross per 24 hour  Intake     60 ml  Output    300 ml  Net   -240 ml    Last BM: None during admit   Labs:   Recent Labs Lab 06/18/13 1150 06/20/13 1005 06/20/13 1547  NA 123* 119* 124*  K 3.7 3.7 3.7  CL 84* 78* 83*  CO2 23 25 27   BUN 4* 4* 3*  CREATININE 0.60 0.61 0.59  CALCIUM 9.0 8.9 8.9  MG  --  1.8  --   PHOS  --  3.0  --   GLUCOSE 109* 117* 108*    CBG (last 3)  No results found for this basename: GLUCAP,  in the last 72 hours  Scheduled Meds: . [COMPLETED] sodium chloride   Intravenous STAT  . celecoxib  100 mg Oral BID  . chlorhexidine  15 mL Mouth/Throat BID  . enoxaparin (LOVENOX) injection  40 mg Subcutaneous Q24H  . folic acid  1 mg Oral Daily  . hydrOXYzine  25 mg Oral QID  . LORazepam  0-4 mg Oral Q6H    Followed by  . [START ON 06/22/2013] LORazepam  0-4 mg Oral Q12H  . multivitamin with minerals  1 tablet Oral Daily  . pantoprazole  40 mg Oral BID AC  . sodium chloride  3 mL Intravenous Q12H  . thiamine  100 mg Oral Daily   Or  . thiamine  100 mg Intravenous Daily  . verapamil  240 mg Oral QHS    Continuous Infusions:   Past Medical History  Diagnosis Date  . Hypertension   . GERD (gastroesophageal reflux disease)   . Depression   . Anxiety   . Chronic pain   . Gastritis   . Colitis   . IBS (irritable bowel syndrome)   . Poor dentition   . Alcohol abuse, in remission   . Esophagitis     Past Surgical History  Procedure Laterality Date  . Hernia repair Bilateral   . Tonsillectomy      Atlee Abide MS RD LDN Clinical Dietitian [phone]

## 2013-06-20 NOTE — Progress Notes (Signed)
[facility] provided pt with a list of primary care resources, [facility] information, and [place] application.

## 2013-06-20 NOTE — ED Notes (Signed)
Patient also reports a raw throat and esophagus from reflux.

## 2013-06-20 NOTE — ED Provider Notes (Signed)
CSN: [id number]     Arrival date & time 06/20/13  4827 History   First MD Initiated Contact with Patient 06/20/13 [phone]     Chief Complaint  Patient presents with  . Chest Pain   (Consider location/radiation/quality/duration/timing/severity/associated sxs/prior Treatment) HPI  Pt presents to the ER for the second time in 2 days with multiple vague complaints of continued "pain all over in my bones",chest pain all over, abdominal pain all over, feeling weak, diarrhea, loss of appetite,  Unable to sleep, sore throat. He says that this has all been going on for a few months and continuing to get worse.  He was seen on 1/14 in the ED and lab work was checked showing slight hypokalemia. He has spoken with his PCP, DR. Norrins quite a few times (records reviewed) and has been given PPI and short term Rx for hydrocodone.   The patient states that his mom died last month and that he doesn't have insurance. They were living off of her income and he can't afford to go to a PCP or a referred specialist.  He says that he has a raw throat due to esophagitis and is worried he may have throat cancer but can't see a GI for further eval.  Past Medical History  Diagnosis Date  . Hypertension   . GERD (gastroesophageal reflux disease)   . Diabetes mellitus without complication    History reviewed. No pertinent past surgical history. History reviewed. No pertinent family history. History  Substance Use Topics  . Smoking status: Former Research scientist (life sciences)  . Smokeless tobacco: Never Used  . Alcohol Use: No    Review of Systems  Constitutional: Positive for appetite change.  HENT: Positive for sore throat.   Cardiovascular: Positive for chest pain.  Gastrointestinal: Positive for abdominal pain and diarrhea.  Musculoskeletal: Positive for myalgias.  Psychiatric/Behavioral: Positive for sleep disturbance.   Pan positive ROS. Allergies  Diphenhydramine hcl and Nsaids  Home Medications   Current Outpatient Rx   Name  Route  Sig  Dispense  Refill  . acetaminophen (TYLENOL) 500 MG tablet   Oral   Take 1,000 mg by mouth every 6 (six) hours as needed for headache.         . cyclobenzaprine (FLEXERIL) 5 MG tablet   Oral   Take 1 tablet (5 mg total) by mouth every 8 (eight) hours as needed for muscle spasms.   30 tablet   0   . HYDROcodone-acetaminophen (NORCO) 10-325 MG per tablet   Oral   Take 1 tablet by mouth every 6 (six) hours as needed.   40 tablet   0   . LORazepam (ATIVAN) 0.5 MG tablet   Oral   Take 1 tablet (0.5 mg total) by mouth every 6 (six) hours as needed for anxiety. FOR BREAKTHROUGH ANXIETY   60 tablet   0   . Multiple Vitamin (MULTIVITAMIN WITH MINERALS) TABS   Oral   Take 1 tablet by mouth daily.         . pantoprazole (PROTONIX) 20 MG tablet   Oral   Take 1 tablet (20 mg total) by mouth daily.   30 tablet   0   . sertraline (ZOLOFT) 100 MG tablet   Oral   Take 1 tablet (100 mg total) by mouth daily. FOR ANXIETY   30 tablet   0   . verapamil (CALAN-SR) 240 MG CR tablet   Oral   Take 240 mg by mouth every evening.  BP 145/74  Pulse 66  Temp(Src) 98.2 F (36.8 C) (Oral)  Resp 16  SpO2 98% Physical Exam  Nursing note and vitals reviewed. Constitutional: He appears well-developed and well-nourished. No distress.  HENT:  Head: Normocephalic and atraumatic.  Eyes: Pupils are equal, round, and reactive to light.  Neck: Normal range of motion. Neck supple.  Cardiovascular: Normal rate and regular rhythm.   Pulmonary/Chest: Effort normal.  Abdominal: Soft.  Neurological: He is alert.  Skin: Skin is warm and dry.    ED Course  Procedures (including critical care time) Labs Review Labs Reviewed  CBC WITH DIFFERENTIAL - Abnormal; Notable for the following:    RBC 4.18 (*)    HCT 34.8 (*)    MCHC 38.2 (*)    Neutrophils Relative % 79 (*)    Lymphocytes Relative 10 (*)    All other components within normal limits  BASIC METABOLIC  PANEL - Abnormal; Notable for the following:    Sodium 119 (*)    Chloride 78 (*)    Glucose, Bld 117 (*)    BUN 4 (*)    All other components within normal limits  URINALYSIS, ROUTINE W REFLEX MICROSCOPIC - Abnormal; Notable for the following:    Hgb urine dipstick MODERATE (*)    Ketones, ur 15 (*)    All other components within normal limits  RAPID STREP SCREEN  CULTURE, GROUP A STREP  URINE MICROSCOPIC-ADD ON   Imaging Review Dg Abd Acute W/chest  06/20/2013   CLINICAL DATA:  Chest pain.  Nausea.  Abdominal distension.  EXAM: ACUTE ABDOMEN SERIES (ABDOMEN 2 VIEW & CHEST 1 VIEW)  COMPARISON:  CT 06/09/2013  FINDINGS: There is gas within small and large bowel but no abnormally dilated loops. No free air. No worrisome calcifications. Chronic ankle oasis of the sacroiliac joints and spine again noted.  Heart and mediastinal shadows are normal. The lungs are clear. No effusions. No free air seen under the diaphragm.  IMPRESSION: No sign of obstruction or free air. There is gas throughout the intestine but no dilated loops.   Electronically Signed   By: Nelson Chimes M.D.   On: 06/20/2013 11:12    EKG Interpretation    Date/Time:  Friday June 20 2013 09:50:12 EST Ventricular Rate:  67 PR Interval:  163 QRS Duration: 118 QT Interval:  435 QTC Calculation: 459 R Axis:   41 Text Interpretation:  Sinus rhythm Nonspecific intraventricular conduction delay Baseline wander in lead(s) II III aVF No significant change since last tracing Confirmed by YAO  MD, DAVID [phone]) on 06/20/2013 9:56:37 AM            MDM   1. Hyponatremia   2. Diarrhea      Blood work shows that the patient has significant hyponatremia, which is down ten points from 1 week ago. He reports that his mother died 67 month ago and he has not been eating and drinking normally and endorses a significant amount of diarrhea. Not bloody, mucousy, or foul odered. No episodes in ED to obtain stool culture.  PCP is Dr.  Linda Hedges with [facility], will call for admission and treatment.  Tele, inpatient, [facility] triad team [address]  Linus Mako, PA-C 06/20/13 1152

## 2013-06-20 NOTE — H&P (Addendum)
[facility] History and Physical  THI SISEMORE YTK:[id number] DOB: 04-30-1956 DOA: 06/20/2013  Referring physician:  Delos Haring PCP:  Adella Hare, MD   Chief Complaint:  Diffuse pains, throat pain, nausea, vomiting, diarrhea, loss of appetite, inability to sleep  HPI:  The patient is a 58 y.o. year-old male with history of HTN, depression, anxiety, EtOH abuse and chronic gastritis/esophagitis, chronic pain, IBS, colitis, poor dentition who presents with the aforementioned complaints.    The patient has been feeling poorly for several months.  Per notes, he has been seen and had phone contact with Doctor Norins for many of the symptoms that he presented to the emergency department with. He complains of anxiety, difficulty sleeping, and chronic pains. In December, he was given a prescription for Vicodin and Ativan.  Later, he was advised to increase his sertraline dose from 50 mg daily 200 mg daily. Per the patient, he had only taken a couple doses of the 50 mg sertraline, and then he stopped taking it because it made him feel jittery. He was also given a prescription for Flexeril which she did not fill because he states it makes him feel anxious. He recently filled his Protonix prescription and has been taking it for the last 5 days, although he considered stopping it because he felt like he was making his gastritis worse.  He repeatedly asked for narcotic pain medication and ativan.  Asked for IV ativan to help him sleep and was very anxious when I offered oral alternatives.    His mother died within the last month and she was his primary caretaker.  He currently does not have medical insurance and is not qualifying for many types of assistance because he owns his house, but he had previously been surviving on his mother's income.  He does not qualify for disability.  He does not have the ability to drive and so people have been bringing in food intermittently, and he only ate a  bologna sandwich yesterday and drank very little.   He complains of diarrhea and states that his stools are yellow and cannot describe the frequency or consistency of his stools.  He has been having severe muscle spasms and some shin pain.  His neck, chest, legs, and back hurt.   In the ER, labs were notable for sodium 119, chloride 78, bicarbonate 25, BUN 4, creatinine 0.61. Chest x-ray and abdominal x-ray were negative. He is being admitted for failure to thrive and hyponatremia.    Review of Systems:  General:  Denies fevers, chills, weight loss or gain HEENT:  Denies changes to hearing and vision, rhinorrhea, sinus congestion, sore throat CV:  Per HPI  PULM:  Denies SOB, wheezing, cough.   GI:  Per HPI GU:  Denies dysuria, frequency, urgency ENDO:  Denies polyuria, polydipsia.   HEME:  Had some streaks of blood in his stools recently, since resolved.  LYMPH:  Denies lymphadenopathy.   MSK:  chronic arthralgias, myalgias.   DERM:  Denies skin rash or ulcer.   NEURO:  Denies focal numbness, weakness, slurred speech, confusion, facial droop.  PSYCH:  Denies anxiety and depression.    Past Medical History  Diagnosis Date  . Hypertension   . GERD (gastroesophageal reflux disease)   . Depression   . Anxiety   . Chronic pain   . Gastritis   . Colitis   . IBS (irritable bowel syndrome)   . Poor dentition   . Alcohol abuse, in remission   .  Esophagitis    Past Surgical History  Procedure Laterality Date  . Hernia repair Bilateral   . Tonsillectomy     Social History:  reports that he has quit smoking. His smoking use included Cigarettes. He smoked 0.00 packs per day. He has never used smokeless tobacco. He reports that he drinks alcohol. He reports that he uses illicit drugs. HSG. Long - term disability - unable to work. Lived with his mother in her house - she died 05-16-2023 - he is bankrupt/destitute and soon will loose his home. He has not worked enough quarters to be eligible  for [employer]. He is advised to seek assistance for health care either with [facility].  Allergies  Allergen Reactions  . Diphenhydramine Hcl     REACTION: restlessness  . Flexeril [Cyclobenzaprine]     restlessness  . Nsaids Other (See Comments)    GI upset    Family History  Problem Relation Age of Onset  . Anxiety disorder Mother   . Congestive Heart Failure Mother   . Crohn's disease Mother   . Arthritis Father   . High blood pressure Father      Prior to Admission medications   Medication Sig Start Date End Date Taking? Authorizing Provider  acetaminophen (TYLENOL) 500 MG tablet Take 1,000 mg by mouth every 6 (six) hours as needed for headache.   Yes Historical Provider, MD  HYDROcodone-acetaminophen (NORCO) 10-325 MG per tablet Take 1 tablet by mouth every 6 (six) hours as needed. 05/27/13  Yes Neena Rhymes, MD  LORazepam (ATIVAN) 0.5 MG tablet Take 1 tablet (0.5 mg total) by mouth every 6 (six) hours as needed for anxiety. FOR BREAKTHROUGH ANXIETY 06/18/13  Yes Neena Rhymes, MD  Multiple Vitamin (MULTIVITAMIN WITH MINERALS) TABS Take 1 tablet by mouth daily.   Yes Historical Provider, MD  pantoprazole (PROTONIX) 20 MG tablet Take 1 tablet (20 mg total) by mouth daily. 06/09/13  Yes Leota Jacobsen, MD  verapamil (CALAN-SR) 240 MG CR tablet Take 240 mg by mouth every evening.   Yes Historical Provider, MD   Physical Exam: Filed Vitals:   06/20/13 0941 06/20/13 1340  BP: 145/74 158/89  Pulse: 66 75  Temp: 98.2 F (36.8 C) 98.7 F (37.1 C)  TempSrc: Oral Oral  Resp: 16 18  Height:  5' 11"  (1.803 m)  Weight:  73.982 kg (163 lb 1.6 oz)  SpO2: 98% 100%     General:  CM, NAD  Eyes:  PERRL, anicteric, non-injected.  ENT:  Nares clear.  OP clear, possible ulcer on the right tonsil with white base, no thrush, MMM.  Poor dentition with erythema of the gum line of the anterior teeth  Neck:  Supple without TM or JVD.    Lymph:   No cervical, supraclavicular, or submandibular LAD.  Cardiovascular:  RRR, normal S1, S2, without m/r/g.  2+ pulses, warm extremities  Respiratory:  CTA bilaterally without increased WOB.  Abdomen:  NABS.  Soft, ND/NT.    Skin:  No rashes or focal lesions.  Musculoskeletal:  Normal bulk and tone.  No LE edema.  Psychiatric:  A & O x 4.  Very anxious  Neurologic:  CN 3-12 intact.  5/5 strength.  Sensation intact.  Labs on Admission:  Basic Metabolic Panel:  Recent Labs Lab 06/18/13 1150 06/20/13 1005  NA 123* 119*  K 3.7 3.7  CL 84* 78*  CO2 23 25  GLUCOSE 109* 117*  BUN 4* 4*  CREATININE 0.60 0.61  CALCIUM 9.0 8.9  MG  --  1.8  PHOS  --  3.0   Liver Function Tests: No results found for this basename: AST, ALT, ALKPHOS, BILITOT, PROT, ALBUMIN,  in the last 168 hours No results found for this basename: LIPASE, AMYLASE,  in the last 168 hours No results found for this basename: AMMONIA,  in the last 168 hours CBC:  Recent Labs Lab 06/18/13 1150 06/20/13 1005  WBC 9.8 9.2  NEUTROABS 7.6 7.3  HGB 13.5 13.3  HCT 36.8* 34.8*  MCV 85.2 83.3  PLT 345 357   Cardiac Enzymes: No results found for this basename: CKTOTAL, CKMB, CKMBINDEX, TROPONINI,  in the last 168 hours  BNP (last 3 results) No results found for this basename: PROBNP,  in the last 8760 hours CBG: No results found for this basename: GLUCAP,  in the last 168 hours  Radiological Exams on Admission: Dg Abd Acute W/chest  06/20/2013   CLINICAL DATA:  Chest pain.  Nausea.  Abdominal distension.  EXAM: ACUTE ABDOMEN SERIES (ABDOMEN 2 VIEW & CHEST 1 VIEW)  COMPARISON:  CT 06/09/2013  FINDINGS: There is gas within small and large bowel but no abnormally dilated loops. No free air. No worrisome calcifications. Chronic ankle oasis of the sacroiliac joints and spine again noted.  Heart and mediastinal shadows are normal. The lungs are clear. No effusions. No free air seen under the diaphragm.  IMPRESSION: No sign  of obstruction or free air. There is gas throughout the intestine but no dilated loops.   Electronically Signed   By: Nelson Chimes M.D.   On: 06/20/2013 11:12    EKG: Independently reviewed. NSR with isolated T-wave inversion in III  Assessment/Plan Principal Problem:   Hyponatremia Active Problems:   ALCOHOLISM   HYPERTENSION   GERD   Irritable bowel syndrome   Depression with anxiety   Ankylosing spondylitis   Sore throat  ---  Hyponatremia, possibly due to dehydration, however, he does not clinically appear dry and BUN and creatinine are not elevated.   -  No evidence of cirrhosis on recent CT abd -  TSH and cortisol level -  No evidence of heart failure on exam -  Urine osms and sodium and serum sodium -  Free water restrict to 1.2L -  Patient was not actually taking SSRI -  No nodules or findings suggestive of lung cancer on CXR and not a smoker -  Repeat this evening  Ankylosing spondylitis, seen on recent CT abd/pelvis from 1/5, new dx today. -  Start celecoxib with aggressive GI regimen -  PT evaluation -  ESR and CRP -  Recommend rheumatology follow up  Gastritis/esophagitis with previously normal EGD.  Rapid strep negative. -  Increase protonix to 67m BID and add carafate -  zofran prn  Possible diarrhea and recent blood in stool.  No evidence of colitis on recent CT ab/p but given ank spond and father's hx of crohn's plus personal hx of proctitis, may have crohn's -  GI follow up  Gingivitis/poor dentition.  Pt initially stated he needed to see a dentist but did not have money because his mother just died, however, per notes, this has been going on since 2010 and patient admits that he simply did not see a dentist even when he could have. -  Peridex and viscous lidocaine -  May need antibiotic for apical abscess if not improving  Anxiety and depression, not controlled and not compliant with  zoloft.  Asking repeatedly for benzos. -  Would like to avoid benzos  in patient with addictive tendencies.   -  Options limited because of hyponatremia -  Will try hydroxyzine for now (although had paradoxical rxn to benadryl previously so will have to monitor) -  ambien before bed  EtOH abuse -  CIWA protocol with thiamine and folate  HTN, BP stable.  Continue verapamil  Diet:  Healthy heart, 1.2L fluid restriction Access:  PIV IVF:  Yes (temporary) Proph:  lovenox  Code Status: full Family Communication: patient alone Disposition Plan: Admit to telemetry.  PT/OT  Time spent: 60 min Janece Canterbury [facility] Pager [phone]  If 7PM-7AM, please contact night-coverage [email or web address] Password [email or web address] 06/20/2013, 1:52 PM

## 2013-06-21 DIAGNOSIS — R51 Headache: Secondary | ICD-10-CM

## 2013-06-21 DIAGNOSIS — F10939 Alcohol use, unspecified with withdrawal, unspecified: Secondary | ICD-10-CM

## 2013-06-21 DIAGNOSIS — H532 Diplopia: Secondary | ICD-10-CM

## 2013-06-21 DIAGNOSIS — R519 Headache, unspecified: Secondary | ICD-10-CM

## 2013-06-21 DIAGNOSIS — F10239 Alcohol dependence with withdrawal, unspecified: Secondary | ICD-10-CM

## 2013-06-21 LAB — COMPREHENSIVE METABOLIC PANEL
ALT: 19 U/L (ref 0–53)
AST: 25 U/L (ref 0–37)
Albumin: 3.5 g/dL (ref 3.5–5.2)
Alkaline Phosphatase: 84 U/L (ref 39–117)
BUN: 4 mg/dL — ABNORMAL LOW (ref 6–23)
CALCIUM: 8.5 mg/dL (ref 8.4–10.5)
CO2: 26 mEq/L (ref 19–32)
Chloride: 91 mEq/L — ABNORMAL LOW (ref 96–112)
Creatinine, Ser: 0.64 mg/dL (ref 0.50–1.35)
GFR calc Af Amer: >90 mL/min (ref >90)
GFR calc non Af Amer: >90 mL/min (ref >90)
Glucose, Bld: 100 mg/dL — ABNORMAL HIGH (ref 70–99)
Potassium: 3.4 mEq/L — ABNORMAL LOW (ref 3.7–5.3)
SODIUM: 129 meq/L — AB (ref 137–147)
TOTAL PROTEIN: 6.8 g/dL (ref 6.0–8.3)
Total Bilirubin: 0.4 mg/dL (ref 0.3–1.2)

## 2013-06-21 LAB — CBC
HEMATOCRIT: 34.7 % — AB (ref 39.0–52.0)
HEMOGLOBIN: 12.6 g/dL — AB (ref 13.0–17.0)
MCH: 30.9 pg (ref 26.0–34.0)
MCHC: 36.3 g/dL — AB (ref 30.0–36.0)
MCV: 85 fL (ref 78.0–100.0)
Platelets: 326 10*3/uL (ref 150–400)
RBC: 4.08 MIL/uL — ABNORMAL LOW (ref 4.22–5.81)
RDW: 12.5 % (ref 11.5–15.5)
WBC: 10.2 10*3/uL (ref 4.0–10.5)

## 2013-06-21 LAB — OSMOLALITY, URINE: Osmolality, Ur: 405 mOsm/kg (ref 390–1090)

## 2013-06-21 LAB — HIV ANTIBODY (ROUTINE TESTING W REFLEX): HIV: NONREACTIVE

## 2013-06-21 LAB — SODIUM, URINE, RANDOM: Sodium, Ur: 103 mEq/L

## 2013-06-21 LAB — CORTISOL: Cortisol, Plasma: 16.8 ug/dL

## 2013-06-21 MED ORDER — POTASSIUM CHLORIDE CRYS ER 20 MEQ PO TBCR
40.0000 meq | EXTENDED_RELEASE_TABLET | Freq: Once | ORAL | Status: AC
  Administered 2013-06-21: 40 meq via ORAL
  Filled 2013-06-21: qty 2

## 2013-06-21 MED ORDER — HYDROXYZINE HCL 50 MG PO TABS
50.0000 mg | ORAL_TABLET | Freq: Four times a day (QID) | ORAL | Status: DC
  Administered 2013-06-21 – 2013-06-22 (×4): 50 mg via ORAL
  Filled 2013-06-21 (×8): qty 1

## 2013-06-21 MED ORDER — VERAPAMIL HCL ER 120 MG PO TBCR
120.0000 mg | EXTENDED_RELEASE_TABLET | Freq: Every day | ORAL | Status: DC
  Administered 2013-06-21: 120 mg via ORAL
  Filled 2013-06-21 (×2): qty 1

## 2013-06-21 NOTE — Progress Notes (Addendum)
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 Rapid strep negative.  - continue protonix to 22m BID and add carafate  - zofran prn   Possible diarrhea and recent blood in stool. No evidence of colitis on recent CT ab/p but given ank spond and father's hx of crohn's plus personal hx of proctitis, may have crohn's  - GI follow up   Gingivitis/poor dentition.  Stating he has a hard time eating - Peridex and viscous lidocaine  - May need antibiotic for apical abscess if not improving  - change to dysphagia 2 diet  Anxiety and depression, not controlled and not compliant with zoloft. Asking repeatedly for benzos.  - Would like to avoid benzos in patient with addictive tendencies.  - Options limited because of risk of hyponatremia  - Increase hydroxyzine to 541mfour times per day and titrate as needed (up to 10049mour times per day) - ambien before bed   EtOH abuse, last drink about 4 days ago.  CIWA scores 15 and 10 overnight.   - Continue CIWA protocol with thiamine and folate  -  If worsening withdrawal, can increase frequency of scores/prns  HTN, BP low and bradycardic to the 40s on telemetry.  I question whether  he was truly compliant with verapamil at home -  Decrease verapamil to 120m34mDiplopia with headache and now having off-balance sensation -  MRI/MRA to eval for stroke or aneurysm -  Advised to see ophthalmologist   Diet:  Dysphagia 2, 2gm sodium, 1.2L fluid Access:  PIV IVF:  OFF Proph:  lovenox  Code Status: full Family Communication: patient alone Disposition Plan: pending improvement in CIWA scores, PT/OT eval   Consultants:  None  Procedures:  MRI, MRA  Antibiotics:  none   HPI/Subjective:  Complains of off-balance sensation when getting up to void.  Double vision up close, far away and when looking to either side.  Persistent headache.      Objective: Filed Vitals:   06/20/13 0941 06/20/13 1340 06/20/13 2236 06/21/13 0629  BP: 145/74 158/89 129/70 95/64  Pulse: 66 75 63 64  Temp: 98.2 F (36.8 C) 98.7 F (37.1 C) 98 F (36.7 C) 97.9 F (36.6 C)  TempSrc: Oral Oral Oral Oral  Resp: _0 Height:  _1  (1.803 m)    Weight:  73.982 kg (163 lb 1.6 oz)    SpO2: 98% 100% 99% 99%    Intake/Output Summary (Last 24 hours) at 06/21/13 1306 Last data filed at 06/21/13 0956  Gross per 24 hour  Intake    740 ml  Output   1725 ml  Net   -985 ml   Filed  Weights   06/20/13 1340  Weight: 73.982 kg (163 lb 1.6 oz)    Exam:   General:  CM, No acute distress, less anxious than yesterday  HEENT:  NCAT, MMM  Cardiovascular:  RRR, nl S1, S2 no mrg, 2+ pulses, warm extremities  Respiratory:  CTAB, no increased WOB  Abdomen:   NABS, soft, NT/ND  MSK:   Normal tone and bulk, no LEE  Neuro:  Grossly intact, mild tremulousness  Data Reviewed: Basic Metabolic Panel:  Recent Labs Lab 06/18/13 1150 06/20/13 1005 06/20/13 1547 06/21/13 0540  NA 123* 119* 124* 129*  K 3.7 3.7 3.7 3.4*  CL 84* 78* 83* 91*  CO2 _0 GLUCOSE 109* 117* 108* 100*  BUN 4* 4* 3* 4*  CREATININE 0.60 0.61 0.59 0.64  CALCIUM 9.0 8.9 8.9 8.5  MG  --  1.8  --    --   PHOS  --  3.0  --   --    Liver Function Tests:  Recent Labs Lab 06/21/13 0540  AST 25  ALT 19  ALKPHOS 84  BILITOT 0.4  PROT 6.8  ALBUMIN 3.5   No results found for this basename: LIPASE, AMYLASE,  in the last 168 hours No results found for this basename: AMMONIA,  in the last 168 hours CBC:  Recent Labs Lab 06/18/13 1150 06/20/13 1005 06/21/13 0540  WBC 9.8 9.2 10.2  NEUTROABS 7.6 7.3  --   HGB 13.5 13.3 12.6*  HCT 36.8* 34.8* 34.7*  MCV 85.2 83.3 85.0  PLT 345 357 326   Cardiac Enzymes:  Recent Labs Lab 06/20/13 1415  TROPONINI <0.30   BNP (last 3 results) No results found for this basename: PROBNP,  in the last 8760 hours CBG: No results found for this basename: GLUCAP,  in the last 168 hours  Recent Results (from the past 240 hour(s))  RAPID STREP SCREEN     Status: None   Collection Time    06/20/13 10:24 AM      Result Value Range Status   Streptococcus, Group A Screen (Direct) NEGATIVE  NEGATIVE Final   Comment: (NOTE)     A Rapid Antigen test may result negative if the antigen level in the     sample is below the detection level of this test. The FDA has not     cleared this test as a stand-alone test therefore the rapid antigen     negative result has reflexed to a Group A Strep culture.     Studies: Dg Abd Acute W/chest  06/20/2013   CLINICAL DATA:  Chest pain.  Nausea.  Abdominal distension.  EXAM: ACUTE ABDOMEN SERIES (ABDOMEN 2 VIEW & CHEST 1 VIEW)  COMPARISON:  CT 06/09/2013  FINDINGS: There is gas within small and large bowel but no abnormally dilated loops. No free air. No worrisome calcifications. Chronic ankle oasis of the sacroiliac joints and spine again noted.  Heart and mediastinal shadows are normal. The lungs are clear. No effusions. No free air seen under the diaphragm.  IMPRESSION: No sign of obstruction or free air. There is gas throughout the intestine but no dilated loops.   Electronically Signed   By: Nelson Chimes M.D.    On: 06/20/2013 11:12    Scheduled Meds: . celecoxib  100 mg Oral BID  . chlorhexidine  15 mL Mouth/Throat BID  . enoxaparin (LOVENOX) injection  40 mg Subcutaneous Q24H  . feeding supplement (ENSURE)  1 Container Oral TID WC  .  folic acid  1 mg Oral Daily  . hydrOXYzine  25 mg Oral QID  . LORazepam  0-4 mg Oral Q6H   Followed by  . [START ON 06/22/2013] LORazepam  0-4 mg Oral Q12H  . multivitamin with minerals  1 tablet Oral Daily  . pantoprazole  40 mg Oral BID AC  . sodium chloride  3 mL Intravenous Q12H  . thiamine  100 mg Oral Daily   Or  . thiamine  100 mg Intravenous Daily  . verapamil  240 mg Oral QHS   Continuous Infusions:   Principal Problem:   Hyponatremia Active Problems:   ALCOHOLISM   HYPERTENSION   GERD   Irritable bowel syndrome   Depression with anxiety   Ankylosing spondylitis   Sore throat   Malnutrition of moderate degree    Time spent: 30 min    Alishah Schulte, [facility] Pager [phone]. If 7PM-7AM, please contact night-coverage at [email or web address], password [facility] 06/21/2013, 1:06 PM  LOS: 1 day

## 2013-06-21 NOTE — Evaluation (Signed)
Occupational Therapy Evaluation and Discharge Patient Details Name: Anthony Lamb MRN: [id number] DOB: 09/23/1955 Today's Date: 06/21/2013 Time: 4098-1191 OT Time Calculation (min): 10 min  OT Assessment / Plan / Recommendation History of present illness Diffuse pains, throat pain, nausea, vomiting, diarrhea, loss of appetite, inability to sleep. His mother died within the last month and she was his primary caretaker.  He currently does not have medical insurance and is not qualifying for many types of assistance because he owns his house, but he had previously been surviving on his mother's income.  He does not qualify for disability.  He does not have the ability to drive and so people have been bringing in food intermittently, and he only ate a bologna sandwich yesterday and drank very little. H/O ETOH abuse and use of liiicit drugs   Clinical Impression   This 58 yo male admitted with above current issues and PMHx presents to acute OT at an Independent level for all BADLs and mobility. No further OT or PT needs noted and PT and PT made aware, acute OT will sign off.    OT Assessment  Patient needs continued OT Services;Patient does not need any further OT services (And I do not see any PT needs as well, PT made aware)    Follow Up Recommendations  No OT follow up       Equipment Recommendations  None recommended by OT          Precautions / Restrictions Precautions Precautions: None Restrictions Weight Bearing Restrictions: No   Pertinent Vitals/Pain Abdomen, but I have ulcers.    ADL  Transfers/Ambulation Related to ADLs: Independent with all and nursing reports that when he has needed something he walks out into the hallway looking for her. ADL Comments: No c/o of headache with me even when I asked if he was in pain and if so where. No c/o diplopia and no under or overshooting or running into objects noted during session      OT Goals(Current goals can be found in the  care plan section)    Visit Information  Last OT Received On: 06/21/13 Assistance Needed: +1 History of Present Illness: Diffuse pains, throat pain, nausea, vomiting, diarrhea, loss of appetite, inability to sleep. His mother died within the last month and she was his primary caretaker.  He currently does not have medical insurance and is not qualifying for many types of assistance because he owns his house, but he had previously been surviving on his mother's income.  He does not qualify for disability.  He does not have the ability to drive and so people have been bringing in food intermittently, and he only ate a bologna sandwich yesterday and drank very little. H/O ETOH abuse and use of liiicit drugs       Prior Functioning     Home Living Family/patient expects to be discharged to:: Private residence Living Arrangements: Alone Type of Home: House Home Access: Stairs to enter Technical brewer of Steps: 3 Entrance Stairs-Rails: None Home Layout: One level Home Equipment: None Prior Function Level of Independence: Independent Communication Communication: No difficulties Dominant Hand: Right         Vision/Perception Vision - History Patient Visual Report: No change from baseline   Cognition  Cognition Arousal/Alertness: Awake/alert Behavior During Therapy: WFL for tasks assessed/performed Overall Cognitive Status: Within Functional Limits for tasks assessed    Extremity/Trunk Assessment Upper Extremity Assessment Upper Extremity Assessment: Overall WFL for tasks assessed Lower Extremity Assessment Lower  Extremity Assessment: Overall WFL for tasks assessed     Mobility Bed Mobility Overal bed mobility: Independent Transfers Overall transfer level: Independent General transfer comment: Pt went up and down 4 steps with step to pattern without holding onto anything           End of Session OT - End of Session Equipment Utilized During Treatment:   (None) Activity Tolerance: Patient tolerated treatment well Patient left: in chair;with call bell/phone within reach       Almon Register [phone] 06/21/2013, 3:52 PM

## 2013-06-21 NOTE — Progress Notes (Signed)
PT Cancellation Note  Patient Details Name: Anthony Lamb MRN: [id number] DOB: 1955/12/11   Cancelled Treatment:    Reason Eval/Treat Not Completed: PT screened, no needs identified, will sign off   Claretha Cooper 06/21/2013, 3:43 PM

## 2013-06-21 NOTE — Discharge Summary (Signed)
Physician Discharge Summary  Anthony Lamb ZMO:[id number] DOB: 04-14-1956 DOA: 06/20/2013  PCP: Adella Hare, MD  Admit date: 06/20/2013 Discharge date: 06/22/2013  Recommendations for Outpatient Follow-up:  1. Follow up with primary care doctor within 1 week for repeat BMP to check sodium and potassium.  Needs referral to rheumatology, ophthalmology, dentistry, and GI.  Further evaluation for cause of possible SIADH if hyponatremia recurs or persists.  2. Patient declines ALF or home health 3. Started hydroxyzine for anxiety and recommend behavioral health follow up 4. Reduced dose of verapamil due to hypotension and bradycardia.    Discharge Diagnoses:  Principal Problem:   SIADH (syndrome of inappropriate ADH production) Active Problems:   ALCOHOLISM   HYPERTENSION   GERD   Irritable bowel syndrome   Depression with anxiety   Hyponatremia   Ankylosing spondylitis   Sore throat   Malnutrition of moderate degree   Alcohol withdrawal   Diplopia   Headache(784.0)   Discharge Condition: stable, improved  Diet recommendation:  1.5L fluid restriction  Wt Readings from Last 3 Encounters:  06/20/13 73.982 kg (163 lb 1.6 oz)  05/27/13 75.751 kg (167 lb)  08/22/10 73.483 kg (162 lb)    History of present illness:  The patient is a 58 y.o. year-old male with history of HTN, depression, anxiety, EtOH abuse and chronic gastritis/esophagitis, chronic pain, IBS, colitis, poor dentition who presents with the aforementioned complaints.  The patient has been feeling poorly for several months. Per notes, he has been seen and had phone contact with Doctor Norins for many of the symptoms that he presented to the emergency department with. He complains of anxiety, difficulty sleeping, and chronic pains. In December, he was given a prescription for Vicodin and Ativan. Later, he was advised to increase his sertraline dose from 50 mg daily 200 mg daily. Per the patient, he had only taken a  couple doses of the 50 mg sertraline, and then he stopped taking it because it made him feel jittery. He was also given a prescription for Flexeril which she did not fill because he states it makes him feel anxious. He recently filled his Protonix prescription and has been taking it for the last 5 days, although he considered stopping it because he felt like he was making his gastritis worse. He repeatedly asked for narcotic pain medication and ativan. Asked for IV ativan to help him sleep and was very anxious when I offered oral alternatives.   His mother died within the last month and she was his primary caretaker. He currently does not have medical insurance and is not qualifying for many types of assistance because he owns his house, but he had previously been surviving on his mother's income. He does not qualify for disability. He does not have the ability to drive and so people have been bringing in food intermittently, and he only ate a bologna sandwich yesterday and drank very little.  He complains of diarrhea and states that his stools are yellow and cannot describe the frequency or consistency of his stools. He has been having severe muscle spasms and some shin pain. His neck, chest, legs, and back hurt.  In the ER, labs were notable for sodium 119, chloride 78, bicarbonate 25, BUN 4, creatinine 0.61. Chest x-ray and abdominal x-ray were negative. He is being admitted for failure to thrive and hyponatremia.   Hospital Course:  Hyponatremia, initially thought to be due to dehydration.  He was given IVF but also started on a free  water restriction pending further testing.  Urine osms were 405, urine sodium 103, and serum osms 245 consistent with SIADH.  There were no lung nodules or findings suggestive of lung cancer on CXR and he not a smoker.  Further testing for etiology can be performed by primary care doctor.  No evidence of cirrhosis on recent CT abd although he has history of alcohol abuse.   TSH 0.826 and cortisol level was 16.8.  There was no evidence of heart failure on exam. Patient was not actually taking SSRI, and would recommend SSRI or SNRI given his risk for hyponatremia.  After brief IVF and initiation of restriction, his sodium trended up from 119 to 124 and then to 129.    Ankylosing spondylitis, seen on recent CT abd/pelvis from 1/5, new dx.  Started celecoxib with aggressive GI regimen.    ESR 25 and CRP <0.5.  He was seen by PT who recommended no further follow up.  Recommend rheumatology follow up for monitoring and consideration of non-NSAID treatment options.  Gastritis/esophagitis with previously normal EGD. Rapid strep negative.  Started protonix to 59m BID and add carafate.    Possible diarrhea and recent blood in stool. No evidence of colitis on recent CT ab/p but given ank spond and father's hx of crohn's plus personal hx of proctitis, may have unrecognized crohn's.  No BMs during hospitalization.  GI follow up.  Gingivitis/poor dentition. Stating he has a hard time eating, this is a chronic problem for at least the last 4 years.  Given peridex and viscous lidocaine during hospitalization.  Recommend f/u with dentistry ASAP.    Anxiety and depression, not controlled and not compliant with zoloft. Asking repeatedly for benzos.  Would like to avoid benzos in patient with addictive tendencies.  Options limited because of risk of hyponatremia.  Started hydroxyzine and will recommend 1074mTID for home.  Gave number for behavioral health.    EtOH abuse, last drink about 4 days ago. CIWA scores 15 and 10 initially, but have been 4-7 over last 24 hours.  Patient was recently given Rx for lorazepam by PCP so will not give further taper of benzo for alcohol withdrawal at discharge.    HTN, BP low and bradycardic to the 40s on telemetry. I question whether he was truly compliant with verapamil at home.  Decreased verapamil dose to 12075m Diplopia with headache, description  of symptoms is inconsistent.  Sometimes he states he has double vision, othertimes he denies double vision and describes blurry vision at distances.  Advised to see ophthalmologist.  Consider MRI/MRA to eval for stroke or aneurysm if symptoms progress.  Patient does not have finances and does not qualify for some times of assistance because he has never worked consistently and because he owns a home.  He has met with case management and social work previously and has medication card and information about applying for insurance.  He is planning to sell his house.  He has friends who assist him with food and transportation.    Consultants:  None Procedures:  CXR/KUB Antibiotics:  none    Discharge Exam: Filed Vitals:   06/22/13 0900  BP: 133/87  Pulse: 91  Temp:   Resp:    Filed Vitals:   06/21/13 0629 06/21/13 2148 06/22/13 0436 06/22/13 0900  BP: 95/64 115/73 126/71 133/87  Pulse: 64 79 77 91  Temp: 97.9 F (36.6 C) 97.9 F (36.6 C) 97.1 F (36.2 C)   TempSrc: Oral Oral Oral  Resp: _0 Height:      Weight:      SpO2: 99% 99% 100%     General: CM, No acute distress, less anxious than yesterday  HEENT: NCAT, MMM  Cardiovascular: RRR, nl S1, S2 no mrg, 2+ pulses, warm extremities  Respiratory: CTAB, no increased WOB  Abdomen: NABS, soft, NT/ND  MSK: Normal tone and bulk, no LEE  Neuro:  CN II-XII grossly intact, unable to read small words on TV, no nystagmus or eso/exotropia or obvious dysconjugate gaze during EOM.   Discharge Instructions      Discharge Orders   Future Orders Complete By Expires   Call MD for:  difficulty breathing, headache or visual disturbances  As directed    Call MD for:  extreme fatigue  As directed    Call MD for:  hives  As directed    Call MD for:  persistant dizziness or light-headedness  As directed    Call MD for:  persistant nausea and vomiting  As directed    Call MD for:  severe uncontrolled pain  As directed    Call MD  for:  temperature >100.4  As directed    Diet general  As directed    Comments:     1.5L fluid restriction   Discharge instructions  As directed    Comments:     You were hospitalized with low salt level and chronic pain.  Please stay hydrated, but do not drink more than 1.5 liters of water or juice or alcohol per day.  Please do not take zoloft as this can lower your salt levers further.  Instead, take hydroxyzine three time a day.  Your heart rate and blood pressure were low, so please reduce your verapmil to 135m nightly.  For your arthritis, you were started on celebrex.  This is a temporary prescription to give you time to see your primary care doctor and a rheumatologist, but should not be continued long term.  Please take protonix 475mdaily and you may try carafate to help with your gastritis and heart burn.  Please follow up with your ophthalmologist, your dentist, and gastroenterology asap.   Increase activity slowly  As directed        Medication List    STOP taking these medications       HYDROcodone-acetaminophen 10-325 MG per tablet  Commonly known as:  NORCO      TAKE these medications       acetaminophen 500 MG tablet  Commonly known as:  TYLENOL  Take 1,000 mg by mouth every 6 (six) hours as needed for headache.     celecoxib 100 MG capsule  Commonly known as:  CELEBREX  Take 1 capsule (100 mg total) by mouth 2 (two) times daily.     feeding supplement (ENSURE) Pudg  Take 1 Container by mouth 3 (three) times daily with meals.     hydrOXYzine 50 MG tablet  Commonly known as:  ATARAX/VISTARIL  Take 2 tablets (100 mg total) by mouth 3 (three) times daily.     LORazepam 0.5 MG tablet  Commonly known as:  ATIVAN  Take 1 tablet (0.5 mg total) by mouth every 6 (six) hours as needed for anxiety. FOR BREAKTHROUGH ANXIETY     multivitamin with minerals Tabs tablet  Take 1 tablet by mouth daily.     pantoprazole 40 MG tablet  Commonly known as:  PROTONIX  Take 1  tablet (40 mg total) by mouth  daily.     sucralfate 1 G tablet  Commonly known as:  CARAFATE  Take 1 tablet (1 g total) by mouth 4 (four) times daily -  with meals and at bedtime. Chew before swallowing     verapamil 120 MG CR tablet  Commonly known as:  CALAN-SR  Take 1 tablet (120 mg total) by mouth every evening.       Follow-up Information   Follow up with Adella Hare, MD. Schedule an appointment as soon as possible for a visit in 1 week.   Specialty:  Internal Medicine   Contact information:   [address] [address] [phone]       Follow up with [facility]. (As needed, walk-ins welcome)    Specialty:  [place] information:   [place] [address] [phone]      Follow up with Silvano Rusk, MD. Schedule an appointment as soon as possible for a visit in 1 month.   Specialty:  Gastroenterology   Contact information:   [address] [place] [address] [phone]       The results of significant diagnostics from this hospitalization (including imaging, microbiology, ancillary and laboratory) are listed below for reference.    Significant Diagnostic Studies: Ct Abdomen Pelvis Wo Contrast  06/09/2013   CLINICAL DATA:  Urolithiasis.  Right-sided pain for 1 month.  EXAM: CT ABDOMEN AND PELVIS WITHOUT CONTRAST  TECHNIQUE: Multidetector CT imaging of the abdomen and pelvis was performed following the standard protocol without intravenous contrast.  COMPARISON:  None.  FINDINGS: Lung Bases: Linear scarring and/ or atelectasis is present in the left lower lobe and lingula. Similar changes are present in the right middle lobe, likely post infectious.  Liver: Unenhanced CT was performed per clinician order. Lack of IV contrast limits sensitivity and specificity, especially for evaluation of abdominal/pelvic solid viscera. Grossly normal.  Spleen:  Normal.  Gallbladder:   Distended.  No calcified stones.  Common bile duct:  Normal.  Pancreas:  Normal.  Adrenal glands: Diffuse thickening of the adrenal glands, compatible with hyperplasia. No discrete nodule/mass.  Kidneys: Negative for calculi or hydronephrosis. Both ureters are within normal limits.  Stomach:  Partially collapsed.  No gross abnormality.  Small bowel:  No obstruction.  No mesenteric adenopathy.  Colon: Appendix not identified. Colonic diverticulosis without diverticulitis.  Pelvic Genitourinary: Urinary bladder appears normal. Prostate calcifications.  Bones: There is contiguous calcification of the anterior longitudinal ligament of the thoracolumbar spine and ankylosis of the spinous processes consistent with ankylosing spondylitis. Ankylosis of the SI joints is also present.  Vasculature: Atherosclerosis without an acute vascular abnormality.  Body Wall: Normal.  IMPRESSION: 1. No acute abnormality. 2. Ankylosing spondylitis. 3. Negative for urolithiasis.   Electronically Signed   By: Dereck Ligas M.D.   On: 06/09/2013 15:20   Dg Abd Acute W/chest  06/20/2013   CLINICAL DATA:  Chest pain.  Nausea.  Abdominal distension.  EXAM: ACUTE ABDOMEN SERIES (ABDOMEN 2 VIEW & CHEST 1 VIEW)  COMPARISON:  CT 06/09/2013  FINDINGS: There is gas within small and large bowel but no abnormally dilated loops. No free air. No worrisome calcifications. Chronic ankle oasis of the sacroiliac joints and spine again noted.  Heart and mediastinal shadows are normal. The lungs are clear. No effusions. No free air seen under the diaphragm.  IMPRESSION: No sign of obstruction or free air. There is gas throughout the intestine but no  dilated loops.   Electronically Signed   By: Nelson Chimes M.D.   On: 06/20/2013 11:12    Microbiology: Recent Results (from the past 240 hour(s))  RAPID STREP SCREEN     Status: None   Collection Time    06/20/13 10:24 AM      Result Value Range Status   Streptococcus, Group A Screen (Direct) NEGATIVE   NEGATIVE Final   Comment: (NOTE)     A Rapid Antigen test may result negative if the antigen level in the     sample is below the detection level of this test. The FDA has not     cleared this test as a stand-alone test therefore the rapid antigen     negative result has reflexed to a Group A Strep culture.     Labs: Basic Metabolic Panel:  Recent Labs Lab 06/18/13 1150 06/20/13 1005 06/20/13 1547 06/21/13 0540  NA 123* 119* 124* 129*  K 3.7 3.7 3.7 3.4*  CL 84* 78* 83* 91*  CO2 _0 GLUCOSE 109* 117* 108* 100*  BUN 4* 4* 3* 4*  CREATININE 0.60 0.61 0.59 0.64  CALCIUM 9.0 8.9 8.9 8.5  MG  --  1.8  --   --   PHOS  --  3.0  --   --    Liver Function Tests:  Recent Labs Lab 06/21/13 0540  AST 25  ALT 19  ALKPHOS 84  BILITOT 0.4  PROT 6.8  ALBUMIN 3.5   No results found for this basename: LIPASE, AMYLASE,  in the last 168 hours No results found for this basename: AMMONIA,  in the last 168 hours CBC:  Recent Labs Lab 06/18/13 1150 06/20/13 1005 06/21/13 0540  WBC 9.8 9.2 10.2  NEUTROABS 7.6 7.3  --   HGB 13.5 13.3 12.6*  HCT 36.8* 34.8* 34.7*  MCV 85.2 83.3 85.0  PLT 345 357 326   Cardiac Enzymes:  Recent Labs Lab 06/20/13 1415  TROPONINI <0.30   BNP: BNP (last 3 results) No results found for this basename: PROBNP,  in the last 8760 hours CBG: No results found for this basename: GLUCAP,  in the last 168 hours  Time coordinating discharge: 45 minutes  Signed:  Ahmani Daoud  [facility] 06/22/2013, 10:31 AM

## 2013-06-22 DIAGNOSIS — K219 Gastro-esophageal reflux disease without esophagitis: Secondary | ICD-10-CM

## 2013-06-22 DIAGNOSIS — E222 Syndrome of inappropriate secretion of antidiuretic hormone: Secondary | ICD-10-CM

## 2013-06-22 LAB — CULTURE, GROUP A STREP

## 2013-06-22 MED ORDER — SUCRALFATE 1 G PO TABS: 1.0000 g | ORAL_TABLET | Freq: Three times a day (TID) | ORAL | Status: DC

## 2013-06-22 MED ORDER — HYDROXYZINE HCL 50 MG PO TABS: 100.0000 mg | ORAL_TABLET | Freq: Three times a day (TID) | ORAL | Status: DC

## 2013-06-22 MED ORDER — CELECOXIB 100 MG PO CAPS: 100.0000 mg | ORAL_CAPSULE | Freq: Two times a day (BID) | ORAL | Status: DC

## 2013-06-22 MED ORDER — PANTOPRAZOLE SODIUM 40 MG PO TBEC: 40.0000 mg | DELAYED_RELEASE_TABLET | Freq: Every day | ORAL | Status: DC

## 2013-06-22 MED ORDER — ENSURE PUDDING PO PUDG: 1.0000 | Freq: Three times a day (TID) | ORAL | Status: DC

## 2013-06-22 MED ORDER — VERAPAMIL HCL ER 120 MG PO TBCR: 120.0000 mg | EXTENDED_RELEASE_TABLET | Freq: Every evening | ORAL | Status: DC

## 2013-06-22 NOTE — Progress Notes (Signed)
Pt discharged to home. DC instructions given. Pt reports he has no insurance to make f/u a[ppointments. Encouraged pt to visit social services office to look into getting some assistance with medicare. Reports he had planned doing so and will go there. Inpatient SW met with patient prior to DC. Left unit in wheelchair pushed by nurse tech to meet ride downstairs. Left in good condition. Vwilliams,rn.

## 2013-06-22 NOTE — Progress Notes (Signed)
Clinical Social Work Department BRIEF PSYCHOSOCIAL ASSESSMENT 06/22/2013  Patient:  Anthony Lamb, Anthony Lamb     Account Number:  [id number]     Admit date:  06/20/2013  Clinical Social Worker:  Levie Heritage  Date/Time:  06/22/2013 10:12 AM  Referred by:  Physician  Date Referred:  06/22/2013 Referred for  Other - See comment   Other Referral:   Psychosocial issues   Interview type:  Patient Other interview type:    PSYCHOSOCIAL DATA Living Status:  ALONE Admitted from facility:   Level of care:   Primary support name:  Lora Paula Primary support relationship to patient:  FRIEND Degree of support available:   poor    CURRENT CONCERNS Current Concerns  Post-Acute Placement   Other Concerns:    SOCIAL WORK ASSESSMENT / PLAN Met with Pt to discuss d/c plans.    Pt reported that he currently lives in his deceased mother's home (she died recently) and that he is not able to make ends meet.  Pt explained that he hasn't worked in 43 years and that he's lived with his mom, cared for her and lived off her Social Security income.    Pt stated that he's not able to work due to back pxs and high blood pressure.  He's not eligible for disability, as he hasn't worked in 63 years.  Pt stated that he's an only child and that he has few relative.  Of the relatives that he does have, none of them are able to assist him.    Pt stated that he intends to sell the house and to move into something smaller that requires less maintenance and has less area to heat and cool.  Pt stated that he has some money left to him by his mom but that he must use this to pay the (back?) taxes on the house.  Pt stated that he is having to choose between paying the taxes or the utility bills.  He stated that he's going to choose to pay the taxes and to seek assistance from outside agencies for utility assistance.  To that end, he has contacted [employer] but was informed that they only help with heating  bills.    CSW provided Pt with a list of community assistance agencies and disccussed with Pt applying for [employer].  CSW educated Pt about these services, as he seemed to think that he could only receive services from DSS if he lived in his current residence.  That is, he didn't think he could receive services from DSS if he intends to move soon.    Pt was anxious throughout the assessment and stated, "My nerves are bothering me."    CSW offered emotional support.    CSW thanked Pt for his time.   Assessment/plan status:  No Further Intervention Required Other assessment/ plan:   Information/referral to community resources:   Emergency assistance through [employer].    PATIENT'S/FAMILY'S RESPONSE TO PLAN OF CARE: Pt was cooperative, however he was anxious during the ax. Pt seems to have a clear plan on how to proceed with helping himself.  Pt was grateful to CSW for the information on community agencies but did seem hesitant to contact these.    Pt to d/c today.  He stated that he has a friend who will provide transportation.    Pt thanked CSW for time and assistance.   Bernita Raisin, [place] [facility] [phone]

## 2013-06-23 ENCOUNTER — Emergency Department (HOSPITAL_COMMUNITY): Payer: Self-pay

## 2013-06-23 ENCOUNTER — Encounter (HOSPITAL_COMMUNITY): Payer: Self-pay | Admitting: Emergency Medicine

## 2013-06-23 ENCOUNTER — Emergency Department (HOSPITAL_COMMUNITY)
Admission: EM | Admit: 2013-06-23 | Discharge: 2013-06-23 | Disposition: A | Discharge status: Home / Self Care | Payer: Self-pay | Attending: Emergency Medicine | Admitting: Emergency Medicine

## 2013-06-23 DIAGNOSIS — I10 Essential (primary) hypertension: Secondary | ICD-10-CM | POA: Insufficient documentation

## 2013-06-23 DIAGNOSIS — F3289 Other specified depressive episodes: Secondary | ICD-10-CM | POA: Insufficient documentation

## 2013-06-23 DIAGNOSIS — F411 Generalized anxiety disorder: Secondary | ICD-10-CM | POA: Insufficient documentation

## 2013-06-23 DIAGNOSIS — G8929 Other chronic pain: Secondary | ICD-10-CM | POA: Insufficient documentation

## 2013-06-23 DIAGNOSIS — E871 Hypo-osmolality and hyponatremia: Secondary | ICD-10-CM | POA: Insufficient documentation

## 2013-06-23 DIAGNOSIS — Z791 Long term (current) use of non-steroidal anti-inflammatories (NSAID): Secondary | ICD-10-CM | POA: Insufficient documentation

## 2013-06-23 DIAGNOSIS — Z79899 Other long term (current) drug therapy: Secondary | ICD-10-CM | POA: Insufficient documentation

## 2013-06-23 DIAGNOSIS — J329 Chronic sinusitis, unspecified: Secondary | ICD-10-CM | POA: Insufficient documentation

## 2013-06-23 DIAGNOSIS — K219 Gastro-esophageal reflux disease without esophagitis: Secondary | ICD-10-CM | POA: Insufficient documentation

## 2013-06-23 DIAGNOSIS — F329 Major depressive disorder, single episode, unspecified: Secondary | ICD-10-CM | POA: Insufficient documentation

## 2013-06-23 DIAGNOSIS — Z87891 Personal history of nicotine dependence: Secondary | ICD-10-CM | POA: Insufficient documentation

## 2013-06-23 LAB — POCT I-STAT, CHEM 8
BUN: 3 mg/dL — ABNORMAL LOW (ref 6–23)
CHLORIDE: 90 meq/L — AB (ref 96–112)
CREATININE: 0.9 mg/dL (ref 0.50–1.35)
Calcium, Ion: 1.16 mmol/L (ref 1.12–1.23)
Glucose, Bld: 103 mg/dL — ABNORMAL HIGH (ref 70–99)
HCT: 40 % (ref 39.0–52.0)
Hemoglobin: 13.6 g/dL (ref 13.0–17.0)
POTASSIUM: 3.7 meq/L (ref 3.7–5.3)
Sodium: 129 mEq/L — ABNORMAL LOW (ref 137–147)
TCO2: 28 mmol/L (ref 0–100)

## 2013-06-23 LAB — GLUCOSE, CAPILLARY: GLUCOSE-CAPILLARY: 84 mg/dL (ref 70–99)

## 2013-06-23 LAB — CBC WITH DIFFERENTIAL/PLATELET
BASOS ABS: 0 10*3/uL (ref 0.0–0.1)
Basophils Relative: 1 % (ref 0–1)
EOS ABS: 0 10*3/uL (ref 0.0–0.7)
EOS PCT: 0 % (ref 0–5)
HCT: 35.9 % — ABNORMAL LOW (ref 39.0–52.0)
Hemoglobin: 12.8 g/dL — ABNORMAL LOW (ref 13.0–17.0)
Lymphocytes Relative: 16 % (ref 12–46)
Lymphs Abs: 1.3 10*3/uL (ref 0.7–4.0)
MCH: 31.1 pg (ref 26.0–34.0)
MCHC: 35.7 g/dL (ref 30.0–36.0)
MCV: 87.1 fL (ref 78.0–100.0)
Monocytes Absolute: 0.7 10*3/uL (ref 0.1–1.0)
Monocytes Relative: 9 % (ref 3–12)
Neutro Abs: 6 10*3/uL (ref 1.7–7.7)
Neutrophils Relative %: 75 % (ref 43–77)
PLATELETS: 360 10*3/uL (ref 150–400)
RBC: 4.12 MIL/uL — ABNORMAL LOW (ref 4.22–5.81)
RDW: 12.8 % (ref 11.5–15.5)
WBC: 8 10*3/uL (ref 4.0–10.5)

## 2013-06-23 LAB — URINALYSIS, ROUTINE W REFLEX MICROSCOPIC
BILIRUBIN URINE: NEGATIVE
Glucose, UA: NEGATIVE mg/dL
Ketones, ur: NEGATIVE mg/dL
Leukocytes, UA: NEGATIVE
NITRITE: NEGATIVE
PH: 6.5 (ref 5.0–8.0)
Protein, ur: NEGATIVE mg/dL
SPECIFIC GRAVITY, URINE: 1.015 (ref 1.005–1.030)
Urobilinogen, UA: 0.2 mg/dL (ref 0.0–1.0)

## 2013-06-23 LAB — CG4 I-STAT (LACTIC ACID): Lactic Acid, Venous: 0.81 mmol/L (ref 0.5–2.2)

## 2013-06-23 LAB — COMPREHENSIVE METABOLIC PANEL
ALT: 22 U/L (ref 0–53)
AST: 22 U/L (ref 0–37)
Albumin: 4 g/dL (ref 3.5–5.2)
Alkaline Phosphatase: 92 U/L (ref 39–117)
BUN: 5 mg/dL — ABNORMAL LOW (ref 6–23)
CALCIUM: 9.1 mg/dL (ref 8.4–10.5)
CO2: 28 mEq/L (ref 19–32)
Chloride: 90 mEq/L — ABNORMAL LOW (ref 96–112)
Creatinine, Ser: 0.76 mg/dL (ref 0.50–1.35)
GFR calc Af Amer: >90 mL/min (ref >90)
GFR calc non Af Amer: >90 mL/min (ref >90)
GLUCOSE: 104 mg/dL — AB (ref 70–99)
Potassium: 3.8 mEq/L (ref 3.7–5.3)
SODIUM: 130 meq/L — AB (ref 137–147)
Total Bilirubin: 0.4 mg/dL (ref 0.3–1.2)
Total Protein: 7.4 g/dL (ref 6.0–8.3)

## 2013-06-23 LAB — PRO B NATRIURETIC PEPTIDE: Pro B Natriuretic peptide (BNP): 52.3 pg/mL (ref 0–125)

## 2013-06-23 LAB — URINE MICROSCOPIC-ADD ON

## 2013-06-23 LAB — POCT I-STAT TROPONIN I: TROPONIN I, POC: 0 ng/mL (ref 0.00–0.08)

## 2013-06-23 MED ORDER — AMOXICILLIN-POT CLAVULANATE 875-125 MG PO TABS: 1.0000 | ORAL_TABLET | Freq: Two times a day (BID) | ORAL | Status: DC

## 2013-06-23 MED ORDER — SODIUM CHLORIDE 0.9 % IV BOLUS (SEPSIS)
1000.0000 mL | Freq: Once | INTRAVENOUS | Status: AC
  Administered 2013-06-23: 1000 mL via INTRAVENOUS

## 2013-06-23 NOTE — Discharge Instructions (Signed)
Take antibiotic to completion for sinus infection. Follow up with your doctor about hyponatremia.  Hyponatremia  Hyponatremia is when the salt (sodium) in your blood is low. When salt becomes low, your cells take in extra water and puff up (swell). The puffiness can happen in the whole body. It mostly affects the brain and is very serious.  HOME CARE  Only take medicine as told by your doctor.  Follow any diet instructions you were given. This includes limiting how much fluid you drink.  Keep all doctor visits for tests as told.  Avoid alcohol and drugs. GET HELP RIGHT AWAY IF:  You start to twitch and shake (seize).  You pass out (faint).  You continue to have watery poop (diarrhea) or you throw up (vomit).  You feel sick to your stomach (nauseous).  You are tired (fatigued), have a headache, are confused, or feel weak.  Your problems that first brought you to the doctor come back.  You have trouble following your diet instructions. MAKE SURE YOU:   Understand these instructions.  Will watch your condition.  Will get help right away if you are not doing well or get worse. Document Released: 02/01/2011 Document Revised: 08/14/2011 Document Reviewed: 02/01/2011 [facility] Patient Information 2014 [place], [place].  Sinusitis Sinusitis is redness, soreness, and puffiness (inflammation) of the air pockets in the bones of your face (sinuses). The redness, soreness, and puffiness can cause air and mucus to get trapped in your sinuses. This can allow germs to grow and cause an infection.  HOME CARE   Drink enough fluids to keep your pee (urine) clear or pale yellow.  Use a humidifier in your home.  Run a hot shower to create steam in the bathroom. Sit in the bathroom with the door closed. Breathe in the steam 3 4 times a day.  Put a warm, moist washcloth on your face 3 4 times a day, or as told by your doctor.  Use salt water sprays (saline sprays) to wet the thick fluid in  your nose. This can help the sinuses drain.  Only take medicine as told by your doctor. GET HELP RIGHT AWAY IF:   Your pain gets worse.  You have very bad headaches.  You are sick to your stomach (nauseous).  You throw up (vomit).  You are very sleepy (drowsy) all the time.  Your face is puffy (swollen).  Your vision changes.  You have a stiff neck.  You have trouble breathing. MAKE SURE YOU:   Understand these instructions.  Will watch your condition.  Will get help right away if you are not doing well or get worse. Document Released: 11/08/2007 Document Revised: 02/14/2012 Document Reviewed: 12/26/2011 [facility] Patient Information 2014 [place].

## 2013-06-23 NOTE — ED Provider Notes (Signed)
Medical screening examination/treatment/procedure(s) were performed by non-physician practitioner and as supervising physician I was immediately available for consultation/collaboration.  EKG Interpretation    Date/Time:  Monday June 23 2013 12:20:05 EST Ventricular Rate:  73 PR Interval:  149 QRS Duration: 113 QT Interval:  430 QTC Calculation: 474 R Axis:   60 Text Interpretation:  Sinus rhythm Borderline intraventricular conduction delay Borderline T abnormalities, diffuse leads Baseline wander in lead(s) I II aVR aVL No significant change since last tracing Confirmed by Betsey Holiday  MD, Mylasia Vorhees ([id number]) on 06/23/2013 12:22:55 PM              Orpah Greek, MD 06/23/13 1339

## 2013-06-23 NOTE — ED Notes (Signed)
Per EMS, pt c/o generalized body aches x 1 week.  No fever, cough.  Pt was given new scripts 3 days ago and has not filled them.  Vitals:  140 palpated, hr 90, resp 18

## 2013-06-23 NOTE — ED Notes (Signed)
Pt just d/c home with hyponatremia.  States symptoms not improved.

## 2013-06-23 NOTE — ED Provider Notes (Signed)
Medical screening examination/treatment/procedure(s) were performed by non-physician practitioner and as supervising physician I was immediately available for consultation/collaboration.  EKG Interpretation    Date/Time:  Friday June 20 2013 09:50:12 EST Ventricular Rate:  67 PR Interval:  163 QRS Duration: 118 QT Interval:  435 QTC Calculation: 459 R Axis:   41 Text Interpretation:  Sinus rhythm Nonspecific intraventricular conduction delay Baseline wander in lead(s) II III aVF No significant change since last tracing Confirmed by Leianne Callins  MD, Lounette Sloan [phone]) on 06/20/2013 9:56:37 AM              Wandra Arthurs, MD 06/23/13 1027

## 2013-06-23 NOTE — ED Provider Notes (Signed)
CSN: [id number]     Arrival date & time 06/23/13  2536 History   First MD Initiated Contact with Patient 06/23/13 1055     Chief Complaint  Patient presents with  . Generalized Body Aches   (Consider location/radiation/quality/duration/timing/severity/associated sxs/prior Treatment) HPI Comments: Patient is a 58 year old male with a past medical history of hypertension, depression, chronic pain, IBS, alcohol abuse and GERD who presents to the emergency department complaining of continued generalized weakness, feeling of dehydration and confusion since being discharged from the hospital on January 17, 2 days ago. Patient was admitted to the hospital for hyponatremia, diagnosed with SIADH. He had a complete workup with labs, sodium at d/c was 129. No imaging other than an acute abdomen with chest was done. Patient states since being discharged, he has continued to have bodyaches, weakness, fatigue, dry mouth and confusion. States he is occasionally seeing double vision. Feels a lot of pressure in his sinuses. Admits that he used 2 views narcotic pain medication, however he has not done this in years. Denies alcohol use since leaving the hospital. States he is occasionally short of breath, worse on exertion. He has had mild intermittent chest pains is developing headache from feeling dehydrated. Denies fever or chills.  The history is provided by the patient and medical records.    Past Medical History  Diagnosis Date  . Hypertension   . GERD (gastroesophageal reflux disease)   . Depression   . Anxiety   . Chronic pain   . Gastritis   . Colitis   . IBS (irritable bowel syndrome)   . Poor dentition   . Alcohol abuse, in remission   . Esophagitis    Past Surgical History  Procedure Laterality Date  . Hernia repair Bilateral   . Tonsillectomy     Family History  Problem Relation Age of Onset  . Anxiety disorder Mother   . Congestive Heart Failure Mother   . Crohn's disease Mother   .  Arthritis Father   . High blood pressure Father    History  Substance Use Topics  . Smoking status: Former Smoker    Types: Cigarettes  . Smokeless tobacco: Never Used  . Alcohol Use: Yes     Comment: quit 4 days ago    Review of Systems  Constitutional: Positive for activity change and fatigue.  Eyes: Positive for visual disturbance.  Respiratory: Positive for shortness of breath.   Cardiovascular: Positive for chest pain.  Musculoskeletal: Positive for arthralgias and myalgias.  Neurological: Positive for weakness, light-headedness and headaches.  Psychiatric/Behavioral: Positive for confusion.  All other systems reviewed and are negative.    Allergies  Diphenhydramine hcl; Flexeril; and Nsaids  Home Medications   Current Outpatient Rx  Name  Route  Sig  Dispense  Refill  . acetaminophen (TYLENOL) 500 MG tablet   Oral   Take 1,000 mg by mouth every 6 (six) hours as needed for headache.         . pantoprazole (PROTONIX) 40 MG tablet   Oral   Take 1 tablet (40 mg total) by mouth daily.   30 tablet   0   . celecoxib (CELEBREX) 100 MG capsule   Oral   Take 1 capsule (100 mg total) by mouth 2 (two) times daily.   60 capsule   0   . feeding supplement, ENSURE, (ENSURE) PUDG   Oral   Take 1 Container by mouth 3 (three) times daily with meals.   90 Container   0   .  hydrOXYzine (ATARAX/VISTARIL) 50 MG tablet   Oral   Take 2 tablets (100 mg total) by mouth 3 (three) times daily.   180 tablet   0   . LORazepam (ATIVAN) 0.5 MG tablet   Oral   Take 1 tablet (0.5 mg total) by mouth every 6 (six) hours as needed for anxiety. FOR BREAKTHROUGH ANXIETY   60 tablet   0   . Multiple Vitamin (MULTIVITAMIN WITH MINERALS) TABS   Oral   Take 1 tablet by mouth daily.         . sucralfate (CARAFATE) 1 G tablet   Oral   Take 1 tablet (1 g total) by mouth 4 (four) times daily -  with meals and at bedtime. Chew before swallowing   120 tablet   0   . verapamil  (CALAN-SR) 120 MG CR tablet   Oral   Take 1 tablet (120 mg total) by mouth every evening.   30 tablet   0    BP 157/86  Pulse 86  Temp(Src) 97.9 F (36.6 C) (Oral)  Resp 16  SpO2 97% Physical Exam  Nursing note and vitals reviewed. Constitutional: He is oriented to person, place, and time. He appears well-developed and well-nourished. No distress.  HENT:  Head: Normocephalic and atraumatic.  Nose: Mucosal edema present. Right sinus exhibits maxillary sinus tenderness and frontal sinus tenderness. Left sinus exhibits maxillary sinus tenderness and frontal sinus tenderness.  Mouth/Throat: Mucous membranes are dry.  Eyes: Conjunctivae and EOM are normal. Pupils are equal, round, and reactive to light. No scleral icterus.  Neck: Normal range of motion. Neck supple. No JVD present.  Cardiovascular: Normal rate, regular rhythm, normal heart sounds and intact distal pulses.   Pulmonary/Chest: Effort normal and breath sounds normal.  Abdominal: Soft. Bowel sounds are normal. He exhibits no distension. There is generalized tenderness. There is no rigidity, no rebound and no guarding.  No peritoneal signs.  Musculoskeletal: Normal range of motion. He exhibits no edema.  Neurological: He is alert and oriented to person, place, and time. No cranial nerve deficit. Coordination normal.  Skin: Skin is dry.  Psychiatric: His behavior is normal. His mood appears anxious.    ED Course  Procedures (including critical care time) Labs Review Labs Reviewed  CBC WITH DIFFERENTIAL - Abnormal; Notable for the following:    RBC 4.12 (*)    Hemoglobin 12.8 (*)    HCT 35.9 (*)    All other components within normal limits  COMPREHENSIVE METABOLIC PANEL - Abnormal; Notable for the following:    Sodium 130 (*)    Chloride 90 (*)    Glucose, Bld 104 (*)    BUN 5 (*)    All other components within normal limits  URINALYSIS, ROUTINE W REFLEX MICROSCOPIC - Abnormal; Notable for the following:    Hgb  urine dipstick MODERATE (*)    All other components within normal limits  POCT I-STAT, CHEM 8 - Abnormal; Notable for the following:    Sodium 129 (*)    Chloride 90 (*)    BUN 3 (*)    Glucose, Bld 103 (*)    All other components within normal limits  PRO B NATRIURETIC PEPTIDE  GLUCOSE, CAPILLARY  URINE MICROSCOPIC-ADD ON  CG4 I-STAT (LACTIC ACID)  POCT I-STAT TROPONIN I   Imaging Review Dg Chest 2 View  06/23/2013   CLINICAL DATA:  Short of breath.  EXAM: CHEST  2 VIEW  COMPARISON:  06/20/2013  FINDINGS: The heart size and  mediastinal contours are within normal limits. Both lungs are clear. The bony thorax is demineralized but intact.  IMPRESSION: No active cardiopulmonary disease.   Electronically Signed   By: Lajean Manes M.D.   On: 06/23/2013 11:22   Ct Head Wo Contrast  06/23/2013   CLINICAL DATA:  Headache and seizure.  EXAM: CT HEAD WITHOUT CONTRAST  TECHNIQUE: Contiguous axial images were obtained from the base of the skull through the vertex without intravenous contrast.  COMPARISON:  None.  FINDINGS: No mass. No hydrocephalus. No hemorrhage. No acute bony abnormality. Mild mucosal thickening and ethmoidal sinuses. Fluid noted in the sphenoid sinus. These findings are consistent with sinusitis. Soft tissue density noted in the left external auditory canal most likely cerumen.  IMPRESSION: Ethmoid and sphenoid sinusitis.  Exam otherwise unremarkable.   Electronically Signed   By: Marcello Moores  Register   On: 06/23/2013 11:32    EKG Interpretation    Date/Time:  Monday June 23 2013 12:20:05 EST Ventricular Rate:  73 PR Interval:  149 QRS Duration: 113 QT Interval:  430 QTC Calculation: 474 R Axis:   60 Text Interpretation:  Sinus rhythm Borderline intraventricular conduction delay Borderline T abnormalities, diffuse leads Baseline wander in lead(s) I II aVR aVL No significant change since last tracing Confirmed by POLLINA  MD, CHRISTOPHER ([id number]) on 06/23/2013 12:22:55  PM            MDM   1. Hyponatremia   2. Sinusitis     Pt presenting with weakness, dehydration, confusion, recent admission for hyponatremia. Had an in-depth workup for hyponatremia at that time. Dx with SIADH. He appears dry. Normal neuro exam. Vitals stable. Labs pending- cbc, cmp, bnp, ua, cbg, lactate, troponin. Will obtain CT head given confusion, continued symptoms, headache. CXR pending. Will give fluid bolus. Case discussed with attending Dr. Betsey Holiday who agrees with plan of care. 11:50 AM CT head showing ethmoid and sphenoid sinusitis. CXR normal. 1:36 PM Labs without any significant change since hospital discharge. Na stable at 129. Given sinusitis on CT and pt's symptoms, will d/c with augmentin. After fluids, pt appears less dry, moist mucous membranes. He is stable for discharge. Advised PCP f/u. Return precautions given. Patient states understanding of treatment care plan and is agreeable.   Illene Labrador, PA-C 06/23/13 1338

## 2013-06-28 ENCOUNTER — Encounter (HOSPITAL_COMMUNITY): Payer: Self-pay | Admitting: Emergency Medicine

## 2013-06-28 ENCOUNTER — Emergency Department (HOSPITAL_COMMUNITY)
Admission: EM | Admit: 2013-06-28 | Discharge: 2013-06-28 | Disposition: A | Discharge status: Home / Self Care | Payer: Self-pay | Attending: Emergency Medicine | Admitting: Emergency Medicine

## 2013-06-28 DIAGNOSIS — Z862 Personal history of diseases of the blood and blood-forming organs and certain disorders involving the immune mechanism: Secondary | ICD-10-CM | POA: Insufficient documentation

## 2013-06-28 DIAGNOSIS — I10 Essential (primary) hypertension: Secondary | ICD-10-CM | POA: Insufficient documentation

## 2013-06-28 DIAGNOSIS — R42 Dizziness and giddiness: Secondary | ICD-10-CM | POA: Insufficient documentation

## 2013-06-28 DIAGNOSIS — R5381 Other malaise: Secondary | ICD-10-CM | POA: Insufficient documentation

## 2013-06-28 DIAGNOSIS — G8929 Other chronic pain: Secondary | ICD-10-CM | POA: Insufficient documentation

## 2013-06-28 DIAGNOSIS — F3289 Other specified depressive episodes: Secondary | ICD-10-CM | POA: Insufficient documentation

## 2013-06-28 DIAGNOSIS — F411 Generalized anxiety disorder: Secondary | ICD-10-CM | POA: Insufficient documentation

## 2013-06-28 DIAGNOSIS — R5383 Other fatigue: Secondary | ICD-10-CM

## 2013-06-28 DIAGNOSIS — Z792 Long term (current) use of antibiotics: Secondary | ICD-10-CM | POA: Insufficient documentation

## 2013-06-28 DIAGNOSIS — R519 Headache, unspecified: Secondary | ICD-10-CM

## 2013-06-28 DIAGNOSIS — Z87891 Personal history of nicotine dependence: Secondary | ICD-10-CM | POA: Insufficient documentation

## 2013-06-28 DIAGNOSIS — K219 Gastro-esophageal reflux disease without esophagitis: Secondary | ICD-10-CM | POA: Insufficient documentation

## 2013-06-28 DIAGNOSIS — Z8639 Personal history of other endocrine, nutritional and metabolic disease: Secondary | ICD-10-CM | POA: Insufficient documentation

## 2013-06-28 DIAGNOSIS — R51 Headache: Secondary | ICD-10-CM | POA: Insufficient documentation

## 2013-06-28 DIAGNOSIS — F329 Major depressive disorder, single episode, unspecified: Secondary | ICD-10-CM | POA: Insufficient documentation

## 2013-06-28 DIAGNOSIS — Z8711 Personal history of peptic ulcer disease: Secondary | ICD-10-CM | POA: Insufficient documentation

## 2013-06-28 DIAGNOSIS — Z79899 Other long term (current) drug therapy: Secondary | ICD-10-CM | POA: Insufficient documentation

## 2013-06-28 LAB — CBC WITH DIFFERENTIAL/PLATELET
BASOS ABS: 0.1 10*3/uL (ref 0.0–0.1)
Basophils Relative: 1 % (ref 0–1)
EOS ABS: 0.2 10*3/uL (ref 0.0–0.7)
EOS PCT: 1 % (ref 0–5)
HEMATOCRIT: 36 % — AB (ref 39.0–52.0)
Hemoglobin: 12.5 g/dL — ABNORMAL LOW (ref 13.0–17.0)
Lymphocytes Relative: 25 % (ref 12–46)
Lymphs Abs: 3 10*3/uL (ref 0.7–4.0)
MCH: 31.1 pg (ref 26.0–34.0)
MCHC: 34.7 g/dL (ref 30.0–36.0)
MCV: 89.6 fL (ref 78.0–100.0)
MONO ABS: 0.9 10*3/uL (ref 0.1–1.0)
Monocytes Relative: 8 % (ref 3–12)
Neutro Abs: 7.9 10*3/uL — ABNORMAL HIGH (ref 1.7–7.7)
Neutrophils Relative %: 66 % (ref 43–77)
PLATELETS: 353 10*3/uL (ref 150–400)
RBC: 4.02 MIL/uL — ABNORMAL LOW (ref 4.22–5.81)
RDW: 12.9 % (ref 11.5–15.5)
WBC: 12.1 10*3/uL — ABNORMAL HIGH (ref 4.0–10.5)

## 2013-06-28 LAB — COMPREHENSIVE METABOLIC PANEL
ALT: 16 U/L (ref 0–53)
AST: 15 U/L (ref 0–37)
Albumin: 3.9 g/dL (ref 3.5–5.2)
Alkaline Phosphatase: 83 U/L (ref 39–117)
BUN: 6 mg/dL (ref 6–23)
CALCIUM: 9.1 mg/dL (ref 8.4–10.5)
CO2: 27 meq/L (ref 19–32)
CREATININE: 0.71 mg/dL (ref 0.50–1.35)
Chloride: 94 mEq/L — ABNORMAL LOW (ref 96–112)
Glucose, Bld: 91 mg/dL (ref 70–99)
Potassium: 3.4 mEq/L — ABNORMAL LOW (ref 3.7–5.3)
SODIUM: 134 meq/L — AB (ref 137–147)
Total Bilirubin: 0.4 mg/dL (ref 0.3–1.2)
Total Protein: 7.3 g/dL (ref 6.0–8.3)

## 2013-06-28 MED ORDER — SODIUM CHLORIDE 0.9 % IV BOLUS (SEPSIS)
1000.0000 mL | Freq: Once | INTRAVENOUS | Status: AC
  Administered 2013-06-28: 1000 mL via INTRAVENOUS

## 2013-06-28 MED ORDER — PANTOPRAZOLE SODIUM 40 MG IV SOLR
40.0000 mg | Freq: Once | INTRAVENOUS | Status: AC
  Administered 2013-06-28: 40 mg via INTRAVENOUS
  Filled 2013-06-28: qty 40

## 2013-06-28 MED ORDER — GI COCKTAIL ~~LOC~~
30.0000 mL | Freq: Once | ORAL | Status: AC
  Administered 2013-06-28: 30 mL via ORAL
  Filled 2013-06-28: qty 30

## 2013-06-28 NOTE — ED Notes (Signed)
Per EMS, patient recently seen and evaluated for same complaints. Patient c/o headache, blurred vision when sitting still, R sided pectorial pain, and having an "infection in his head, where things are moving around". Patient symptoms onset 3 months ago, at approx same time his mother passed away. Patient also reports to EMS that he believes he has black mold growing under his house that's killing everyone.

## 2013-06-28 NOTE — ED Notes (Signed)
Bed: WA17 Expected date: 06/28/13 Expected time: 12:51 AM Means of arrival: Ambulance Comments: Bed 17, EMS, 67 M, HA, Vision Prob & Pec Pain

## 2013-06-28 NOTE — ED Notes (Signed)
Patient resting quietly at this time.

## 2013-06-28 NOTE — ED Notes (Signed)
Pt ambulatory to waiting room with slow but steady gait. Pt will take cab home

## 2013-06-28 NOTE — ED Notes (Signed)
Pt denies pain at this time but does believe the cockroaches or the the mold in his home are putting disease into his bones. Pt states he feels too bad to eat or drink at home.

## 2013-06-28 NOTE — ED Provider Notes (Signed)
CSN: [id number]     Arrival date & time 06/28/13  0051 History   First MD Initiated Contact with Patient 06/28/13 0151     Chief Complaint  Patient presents with  . Headache   HPI  History provided by the patient in recent medical charts. Patient is a 58 year old male with history of hypertension, IBS, GERD with peptic ulcer disease, alcohol abuse, anxiety and depression who presents with multiple complaints. Patient returns with continued complaints of diffuse frontal headache, decreased appetite, increased fatigue, visual changes with "floaters". He states he has had decreased appetite for over a year that has been progressively worsening. Patient states he has not eaten much during the day. He will have ensures occasionally. He denies however any significant weight loss. He was recently admitted to the hospital with hyponatremia.  Sodium level did improve but he denies any significant improvement in his symptoms. Patient denies any recent heavy alcohol use. Does report some intermittent marijuana use. Denies any recent. No other aggravating or alleviating factors. No other associated symptoms.    Past Medical History  Diagnosis Date  . Hypertension   . GERD (gastroesophageal reflux disease)   . Depression   . Anxiety   . Chronic pain   . Gastritis   . Colitis   . IBS (irritable bowel syndrome)   . Poor dentition   . Alcohol abuse, in remission   . Esophagitis    Past Surgical History  Procedure Laterality Date  . Hernia repair Bilateral   . Tonsillectomy     Family History  Problem Relation Age of Onset  . Anxiety disorder Mother   . Congestive Heart Failure Mother   . Crohn's disease Mother   . Arthritis Father   . High blood pressure Father    History  Substance Use Topics  . Smoking status: Former Smoker    Types: Cigarettes  . Smokeless tobacco: Never Used  . Alcohol Use: No    Review of Systems  Constitutional: Positive for appetite change and fatigue. Negative  for fever.  Neurological: Positive for weakness, light-headedness and headaches.  All other systems reviewed and are negative.    Allergies  Diphenhydramine hcl; Flexeril; and Nsaids  Home Medications   Current Outpatient Rx  Name  Route  Sig  Dispense  Refill  . acetaminophen (TYLENOL) 500 MG tablet   Oral   Take 1,000 mg by mouth every 6 (six) hours as needed for headache.         Marland Kitchen amoxicillin-clavulanate (AUGMENTIN) 875-125 MG per tablet   Oral   Take 1 tablet by mouth 2 (two) times daily. One po bid x 7 days   14 tablet   0   . hydrOXYzine (ATARAX/VISTARIL) 50 MG tablet   Oral   Take 2 tablets (100 mg total) by mouth 3 (three) times daily.   180 tablet   0   . LORazepam (ATIVAN) 0.5 MG tablet   Oral   Take 1 tablet (0.5 mg total) by mouth every 6 (six) hours as needed for anxiety. FOR BREAKTHROUGH ANXIETY   60 tablet   0   . pantoprazole (PROTONIX) 40 MG tablet   Oral   Take 1 tablet (40 mg total) by mouth daily.   30 tablet   0   . verapamil (CALAN-SR) 120 MG CR tablet   Oral   Take 1 tablet (120 mg total) by mouth every evening.   30 tablet   0   . celecoxib (CELEBREX) 100 MG  capsule   Oral   Take 1 capsule (100 mg total) by mouth 2 (two) times daily.   60 capsule   0   . feeding supplement, ENSURE, (ENSURE) PUDG   Oral   Take 1 Container by mouth 3 (three) times daily with meals.   90 Container   0   . Multiple Vitamin (MULTIVITAMIN WITH MINERALS) TABS   Oral   Take 1 tablet by mouth daily.         . sucralfate (CARAFATE) 1 G tablet   Oral   Take 1 tablet (1 g total) by mouth 4 (four) times daily -  with meals and at bedtime. Chew before swallowing   120 tablet   0    BP 160/93  Pulse 88  Temp(Src) 98.2 F (36.8 C) (Oral)  Resp 18  Ht 5' 11"  (1.803 m)  Wt 160 lb (72.576 kg)  BMI 22.33 kg/m2  SpO2 99% Physical Exam  Nursing note and vitals reviewed. Constitutional: He is oriented to person, place, and time. He appears  well-developed and well-nourished. No distress.  HENT:  Head: Normocephalic and atraumatic.  Mouth/Throat: Oropharynx is clear and moist.  Poor dentition  Eyes: Conjunctivae and EOM are normal. Pupils are equal, round, and reactive to light.  Neck: Normal range of motion. Neck supple.  Cardiovascular: Normal rate and regular rhythm.   Pulmonary/Chest: Effort normal and breath sounds normal. No respiratory distress. He has no wheezes. He has no rales.  Abdominal: Soft. He exhibits no distension. There is no tenderness. There is no rebound and no guarding.  Neurological: He is alert and oriented to person, place, and time. He has normal strength. No cranial nerve deficit or sensory deficit. Gait normal.  Skin: Skin is warm.  Psychiatric: He has a normal mood and affect. Thought content is not paranoid. He expresses no homicidal and no suicidal ideation.    ED Course  Procedures   DIAGNOSTIC STUDIES: Oxygen Saturation is 99% on RA.    COORDINATION OF CARE:  Nursing notes reviewed. Vital signs reviewed. Initial pt interview and examination performed.   2:30AM patient seen and evaluated. Patient appears well no acute distress. Multiple complaints unchanged from multiple previous recent visits to the emergency room. Normal nonfocal neuro exam Discussed work up plan with pt at bedside, which includes repeat lab testing. Pt agrees with plan.  Plans without any significant finding. His sodium is improved. Patient has had head CT recently. There were signs of sinusitis. He has many complaints including decreased appetite without reports of significant weight loss. Headaches occasional "floaters" in vision  Results for orders placed during the hospital encounter of 06/28/13  CBC WITH DIFFERENTIAL      Result Value Range   WBC 12.1 (*) 4.0 - 10.5 K/uL   RBC 4.02 (*) 4.22 - 5.81 MIL/uL   Hemoglobin 12.5 (*) 13.0 - 17.0 g/dL   HCT 36.0 (*) 39.0 - 52.0 %   MCV 89.6  78.0 - 100.0 fL   MCH 31.1   26.0 - 34.0 pg   MCHC 34.7  30.0 - 36.0 g/dL   RDW 12.9  11.5 - 15.5 %   Platelets 353  150 - 400 K/uL   Neutrophils Relative % 66  43 - 77 %   Neutro Abs 7.9 (*) 1.7 - 7.7 K/uL   Lymphocytes Relative 25  12 - 46 %   Lymphs Abs 3.0  0.7 - 4.0 K/uL   Monocytes Relative 8  3 - 12 %  Monocytes Absolute 0.9  0.1 - 1.0 K/uL   Eosinophils Relative 1  0 - 5 %   Eosinophils Absolute 0.2  0.0 - 0.7 K/uL   Basophils Relative 1  0 - 1 %   Basophils Absolute 0.1  0.0 - 0.1 K/uL  COMPREHENSIVE METABOLIC PANEL      Result Value Range   Sodium 134 (*) 137 - 147 mEq/L   Potassium 3.4 (*) 3.7 - 5.3 mEq/L   Chloride 94 (*) 96 - 112 mEq/L   CO2 27  19 - 32 mEq/L   Glucose, Bld 91  70 - 99 mg/dL   BUN 6  6 - 23 mg/dL   Creatinine, Ser 0.71  0.50 - 1.35 mg/dL   Calcium 9.1  8.4 - 10.5 mg/dL   Total Protein 7.3  6.0 - 8.3 g/dL   Albumin 3.9  3.5 - 5.2 g/dL   AST 15  0 - 37 U/L   ALT 16  0 - 53 U/L   Alkaline Phosphatase 83  39 - 117 U/L   Total Bilirubin 0.4  0.3 - 1.2 mg/dL   GFR calc non Af Amer >90  >90 mL/min   GFR calc Af Amer >90  >90 mL/min      MDM   1. Sinus headache        Martie Lee, PA-C 06/28/13 [phone]

## 2013-06-28 NOTE — ED Notes (Signed)
Patient alert and oriented. Patient anxious. Patient states he is sick of being in and out of the hospital. Patient states he has a headache and has for a few months. Also c/o blurry vision that he describes as "not blurry, it's things moving". Patient reports living alone due to his mother passing away a few months ago, around the same time his symptoms started. Patient states he "knows" they aren't related. Patient requested bed be repositioned so he could sleep, as he had not slept for 3 or 4 days.

## 2013-06-28 NOTE — Discharge Instructions (Signed)
Please continue your medications for your congested sinuses. Followup with your primary care provider or a Ear, Nose and Throat specialist for continued evaluation and treatment.    Sinus Headache A sinus headache is when your sinuses become clogged or swollen. Sinus headaches can range from mild to severe.  CAUSES A sinus headache can have different causes, such as:  Colds.  Sinus infections.  Allergies. SYMPTOMS  Symptoms of a sinus headache may vary and can include:  Headache.  Pain or pressure in the face.  Congested or runny nose.  Fever.  Inability to smell.  Pain in upper teeth. Weather changes can make symptoms worse. TREATMENT  The treatment of a sinus headache depends on the cause.  Sinus pain caused by a sinus infection may be treated with antibiotic medicine.  Sinus pain caused by allergies may be helped by allergy medicines (antihistamines) and medicated nasal sprays.  Sinus pain caused by congestion may be helped by flushing the nose and sinuses with saline solution. HOME CARE INSTRUCTIONS   If antibiotics are prescribed, take them as directed. Finish them even if you start to feel better.  Only take over-the-counter or prescription medicines for pain, discomfort, or fever as directed by your caregiver.  If you have congestion, use a nasal spray to help reduce pressure. SEEK IMMEDIATE MEDICAL CARE IF:  You have a fever.  You have headaches more than once a week.  You have sensitivity to light or sound.  You have repeated nausea and vomiting.  You have vision problems.  You have sudden, severe pain in your face or head.  You have a seizure.  You are confused.  Your sinus headaches do not get better after treatment. Many people think they have a sinus headache when they actually have migraines or tension headaches. MAKE SURE YOU:   Understand these instructions.  Will watch your condition.  Will get help right away if you are not doing  well or get worse. Document Released: 06/29/2004 Document Revised: 08/14/2011 Document Reviewed: 08/20/2010 [facility] Patient Information 2014 [place].

## 2013-06-29 NOTE — ED Provider Notes (Signed)
Medical screening examination/treatment/procedure(s) were performed by non-physician practitioner and as supervising physician I was immediately available for consultation/collaboration.    Kalman Drape, MD 06/29/13 2127

## 2013-07-01 ENCOUNTER — Ambulatory Visit: Payer: Self-pay | Admitting: Internal Medicine

## 2013-07-01 ENCOUNTER — Telehealth: Payer: Self-pay

## 2013-07-01 NOTE — Telephone Encounter (Signed)
Patient has been advised and transferred to front desk

## 2013-07-01 NOTE — Telephone Encounter (Signed)
The patient called the triage line and is hoping to get something called in for a possible UTI.  He stated he is hoping to avoid coming in for an ov.

## 2013-07-01 NOTE — Telephone Encounter (Signed)
He has had multiple ED visits 1/19, 1/24. Before that hospitalization. Cannot treat by phone. His options are appointment tomorrow or urgent care-[facility]

## 2013-07-02 ENCOUNTER — Encounter: Payer: Self-pay | Admitting: Internal Medicine

## 2013-07-02 ENCOUNTER — Ambulatory Visit (INDEPENDENT_AMBULATORY_CARE_PROVIDER_SITE_OTHER): Payer: Self-pay | Admitting: Internal Medicine

## 2013-07-02 VITALS — BP 162/104 | HR 85 | Temp 98.4°F | Wt 162.0 lb

## 2013-07-02 DIAGNOSIS — K219 Gastro-esophageal reflux disease without esophagitis: Secondary | ICD-10-CM

## 2013-07-02 DIAGNOSIS — E871 Hypo-osmolality and hyponatremia: Secondary | ICD-10-CM

## 2013-07-02 DIAGNOSIS — R311 Benign essential microscopic hematuria: Secondary | ICD-10-CM

## 2013-07-02 DIAGNOSIS — I10 Essential (primary) hypertension: Secondary | ICD-10-CM

## 2013-07-02 DIAGNOSIS — R3129 Other microscopic hematuria: Secondary | ICD-10-CM

## 2013-07-02 DIAGNOSIS — F341 Dysthymic disorder: Secondary | ICD-10-CM

## 2013-07-02 DIAGNOSIS — F418 Other specified anxiety disorders: Secondary | ICD-10-CM

## 2013-07-02 MED ORDER — TRAMADOL HCL 50 MG PO TABS: 50.0000 mg | ORAL_TABLET | Freq: Two times a day (BID) | ORAL | Status: DC

## 2013-07-02 MED ORDER — VERAPAMIL HCL ER 240 MG PO TBCR: 240.0000 mg | EXTENDED_RELEASE_TABLET | Freq: Every evening | ORAL | Status: DC

## 2013-07-02 NOTE — Patient Instructions (Signed)
1. Microscopic hematuria - by your report this has been looked at in the past by urology. Would not repeat evaluation. You have otherwise had normal Urinalysis and there is no fever or other symptoms to suggest UTI.   2. GI- no blood in the stool on exam. Plan Continue the protonix  [place] the carafate if you have it- it will not hurt you it will help with any acid reflux    3. 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 X-rays have not shown any bony defects. Plan Tramadol 50 mg 1 or 2 three times a day. #180  Tylenol daily but no more than 6 500 mg tablets daily  4. Blood pressure - elevated today. Plan  take verapamil Cr 240 mg once a day  5. Dehydration - the last lab work 06/28/13 showed normal electrolytes and renal function. Plan Drink at least 64 oz per day of fluid of choice: juice, soda, sport drinks, etc.  6. Nutrition - try for well balanced diet. You need to learn to cook: beans and rice, potatoes, soups, etc that you can make at home for a lot less money than eating out using prepared foods.   7. Dental -a real problem. Check with the health department.

## 2013-07-02 NOTE — Progress Notes (Signed)
Pre visit review using our clinic review tool, if applicable. No additional management support is needed unless otherwise documented below in the visit note. 

## 2013-07-02 NOTE — Progress Notes (Signed)
Subjective:    Patient ID: Anthony Lamb, male    DOB: 01-11-1956, 58 y.o.   MRN: [id number]  HPI Anthony Lamb presents for generalized feeling of illness, dehydration, bone pain and weakness. He has had a recent hospitalization for weakness, low sodium, dehydration; he has been evaluated in the ED 1/19 and 1/24. Most recent urinalysis was negative except for RBC in urine. Last CBC with WBC 12.1 with normal diff. He has had CT abdomen and pelvis - negative except for distended gallbladder w/lo stones,  CXR negative 1/19; CT brain negative except for sphenoid sinusitis for which he has had a full course of antibiotics. Labs have been normal. No bony abnormalities on imaging studies except for ankylosing spondylitis.   He reports black stools and abdominal pain but is on PPI therapy. He reports that he has swallowing trouble and has trouble eating and drinking fluids. He has diffuse abdominal pain worse in the epigastrium and in the lower right abdominal area.  Past Medical History  Diagnosis Date  . Hypertension   . GERD (gastroesophageal reflux disease)   . Depression   . Anxiety   . Chronic pain   . Gastritis   . Colitis   . IBS (irritable bowel syndrome)   . Poor dentition   . Alcohol abuse, in remission   . Esophagitis    Past Surgical History  Procedure Laterality Date  . Hernia repair Bilateral   . Tonsillectomy     Family History  Problem Relation Age of Onset  . Anxiety disorder Mother   . Congestive Heart Failure Mother   . Crohn's disease Mother   . Arthritis Father   . High blood pressure Father    History   Social History  . Marital Status: Single    Spouse Name: N/A    Number of Children: N/A  . Years of Education: N/A   Occupational History  . Not on file.   Social History Main Topics  . Smoking status: Former Smoker    Types: Cigarettes  . Smokeless tobacco: Never Used  . Alcohol Use: No  . Drug Use: Yes     Comment: marijuana, quit 4 months  ago.    Marland Kitchen Sexual Activity: No   Other Topics Concern  . Not on file   Social History Narrative   HSG. Long - term disability - unable to work. Lived with his mother in her house - she died 05-17-2023 - he is bankrupt/destitute and soon will loose his home. He has not worked enough quarters to be eligible for [employer]. He is advised to seek assistance for health care either with [facility].    Current Outpatient Prescriptions on File Prior to Visit  Medication Sig Dispense Refill  . acetaminophen (TYLENOL) 500 MG tablet Take 1,000 mg by mouth every 6 (six) hours as needed for headache.      Marland Kitchen amoxicillin-clavulanate (AUGMENTIN) 875-125 MG per tablet Take 1 tablet by mouth 2 (two) times daily. One po bid x 7 days  14 tablet  0  . celecoxib (CELEBREX) 100 MG capsule Take 1 capsule (100 mg total) by mouth 2 (two) times daily.  60 capsule  0  . feeding supplement, ENSURE, (ENSURE) PUDG Take 1 Container by mouth 3 (three) times daily with meals.  90 Container  0  . hydrOXYzine (ATARAX/VISTARIL) 50 MG tablet Take 2 tablets (100 mg total) by mouth 3 (three) times daily.  180 tablet  0  .  LORazepam (ATIVAN) 0.5 MG tablet Take 1 tablet (0.5 mg total) by mouth every 6 (six) hours as needed for anxiety. FOR BREAKTHROUGH ANXIETY  60 tablet  0  . Multiple Vitamin (MULTIVITAMIN WITH MINERALS) TABS Take 1 tablet by mouth daily.      . pantoprazole (PROTONIX) 40 MG tablet Take 1 tablet (40 mg total) by mouth daily.  30 tablet  0  . sucralfate (CARAFATE) 1 G tablet Take 1 tablet (1 g total) by mouth 4 (four) times daily -  with meals and at bedtime. Chew before swallowing  120 tablet  0  . verapamil (CALAN-SR) 120 MG CR tablet Take 1 tablet (120 mg total) by mouth every evening.  30 tablet  0   No current facility-administered medications on file prior to visit.     Review of Systems Pan positive: aches and pains. No cardiac type pain. Abdominal pain. Generalized  weakness.    Objective:   Physical Exam Filed Vitals:   07/02/13 1556  BP: 162/104  Pulse: 85  Temp: 98.4 F (36.9 C)   Wt Readings from Last 3 Encounters:  07/02/13 162 lb (73.483 kg)  06/28/13 160 lb (72.576 kg)  06/20/13 163 lb 1.6 oz (73.982 kg)   BP Readings from Last 3 Encounters:  07/02/13 162/104  06/28/13 152/90  06/23/13 156/86   Gen'l - tall thin man who does not appear malnourished. Normal skin turgor HEENT- C&S clear, PERRLA, poor dentition but no frank caries. Right EAC clear w/ nl TM, left EAC with cerumen but not full impaction, throat is clear Neck - poor range of motion due to pain.\ Nodes - none Cor - 2+ radial RRR Pulm - CTAP Chest - tender to percussion along the spine, tender chest wall at the ribs. No deformity Abd - BS+ x 4, guarding vs poor relaxation. No HSM. Tender to palpation epigastrium and lower right quadrant. Rectal - very tender on exam. Perirectal area red. Normal sphincter tone. Normal prostate. Stool heme negative. Ext - no gross deformity. Neuro - Awake and alert, speech clear. CN II-XII normal. Gait stooped.       Assessment & Plan:  Complaint of dark stools - on exam tan stools with some mucus heme negative.  Multiple complaints of generally not feeling well but over the past month he has had a very thorough evaluation as in-patient and at ED visits with no focal abnormalities. He does appear stable at today's exam.

## 2013-07-06 DIAGNOSIS — R311 Benign essential microscopic hematuria: Secondary | ICD-10-CM | POA: Insufficient documentation

## 2013-07-06 NOTE — Assessment & Plan Note (Signed)
He denies somatization as cause for his symptoms but there is a lack of evidence of organic disease and this cannot be dismissed out of hand

## 2013-07-06 NOTE — Assessment & Plan Note (Signed)
BP Readings from Last 3 Encounters:  07/02/13 162/104  06/28/13 152/90  06/23/13 156/86   At last hospital discharge he was prescribed verapamil ER 120 mg when he had been on 240 mg  Plan Resume verapamil ER 240 mg  Monitor BP at home or drugstore

## 2013-07-06 NOTE — Assessment & Plan Note (Signed)
On review of recent lab microscopic hematuria noted. Per patient this is a chronic problem that has been worked up by urology in the past. In the absence of focal findings, dysuria, weight loss, night sweats, lymphadenopathy will not repeat urology work up at this time - $$ is an issue.

## 2013-07-06 NOTE — Assessment & Plan Note (Signed)
At last hospitalization he was hyponatremic - replenished with NS IVF.  BMET    Component Value Date/Time   NA 134* 06/28/2013 0301   K 3.4* 06/28/2013 0301   CL 94* 06/28/2013 0301   CO2 27 06/28/2013 0301   GLUCOSE 91 06/28/2013 0301   BUN 6 06/28/2013 0301   CREATININE 0.71 06/28/2013 0301   CALCIUM 9.1 06/28/2013 0301   GFRNONAA >90 06/28/2013 0301   GFRAA >90 06/28/2013 0301    Recent Bmet at ED revealed corrected sodium  Plan Hydrate using sports drinks   Cautioned about excessive free water.

## 2013-07-06 NOTE — Assessment & Plan Note (Signed)
Tender in the epigastrium but denies hematemesis. He reports that he is taking PPI therapy.  Plan Continue PPI  Consider re-evaluation by GI

## 2013-07-14 ENCOUNTER — Telehealth: Payer: Self-pay

## 2013-07-14 MED ORDER — PANTOPRAZOLE SODIUM 40 MG PO TBEC: 40.0000 mg | DELAYED_RELEASE_TABLET | Freq: Every day | ORAL | Status: DC

## 2013-07-14 MED ORDER — VERAPAMIL HCL ER 240 MG PO TBCR: 240.0000 mg | EXTENDED_RELEASE_TABLET | Freq: Every evening | ORAL | Status: DC

## 2013-07-14 MED ORDER — LORAZEPAM 0.5 MG PO TABS: 0.5000 mg | ORAL_TABLET | Freq: Four times a day (QID) | ORAL | Status: DC | PRN

## 2013-07-14 NOTE — Telephone Encounter (Signed)
Done

## 2013-07-14 NOTE — Telephone Encounter (Signed)
Dr Linda Hedges okay for lorazepam refill?

## 2013-07-14 NOTE — Telephone Encounter (Signed)
Ok for lorazepam

## 2013-07-14 NOTE — Telephone Encounter (Signed)
Lorazepam refill perapamil for bp med   New rx for protonix for ulcers   Callback - [phone]

## 2013-08-07 ENCOUNTER — Other Ambulatory Visit: Payer: Self-pay | Admitting: Internal Medicine

## 2013-08-08 ENCOUNTER — Other Ambulatory Visit: Payer: Self-pay | Admitting: *Deleted

## 2013-08-08 MED ORDER — HYDROXYZINE HCL 50 MG PO TABS: 100.0000 mg | ORAL_TABLET | Freq: Three times a day (TID) | ORAL | Status: DC

## 2013-08-08 NOTE — Telephone Encounter (Signed)
Patient called back again and corrected himself---says he needs BOTH- Atarax and Ativan.

## 2013-08-08 NOTE — Telephone Encounter (Signed)
Phoned in Ativan rx to Marissa Calamity, RPh

## 2013-08-08 NOTE — Telephone Encounter (Signed)
Patient also called requesting Atarax refill.  CB# [phone]

## 2013-08-08 NOTE — Telephone Encounter (Signed)
Patient was requesting refill for his ATARAX, not Ativan.  Please advise.

## 2013-08-08 NOTE — Telephone Encounter (Signed)
Consulted with PCP; [facility] for refill for Atarax-erx to pharmacy on file.

## 2013-09-27 ENCOUNTER — Emergency Department (HOSPITAL_COMMUNITY)
Admission: EM | Admit: 2013-09-27 | Discharge: 2013-09-27 | Disposition: A | Discharge status: Home / Self Care | Payer: Self-pay | Attending: Emergency Medicine | Admitting: Emergency Medicine

## 2013-09-27 ENCOUNTER — Encounter (HOSPITAL_COMMUNITY): Payer: Self-pay | Admitting: Emergency Medicine

## 2013-09-27 DIAGNOSIS — K029 Dental caries, unspecified: Secondary | ICD-10-CM | POA: Insufficient documentation

## 2013-09-27 DIAGNOSIS — M549 Dorsalgia, unspecified: Secondary | ICD-10-CM | POA: Insufficient documentation

## 2013-09-27 DIAGNOSIS — Z791 Long term (current) use of non-steroidal anti-inflammatories (NSAID): Secondary | ICD-10-CM | POA: Insufficient documentation

## 2013-09-27 DIAGNOSIS — F411 Generalized anxiety disorder: Secondary | ICD-10-CM | POA: Insufficient documentation

## 2013-09-27 DIAGNOSIS — R110 Nausea: Secondary | ICD-10-CM

## 2013-09-27 DIAGNOSIS — F3289 Other specified depressive episodes: Secondary | ICD-10-CM | POA: Insufficient documentation

## 2013-09-27 DIAGNOSIS — G8929 Other chronic pain: Secondary | ICD-10-CM | POA: Insufficient documentation

## 2013-09-27 DIAGNOSIS — Z87891 Personal history of nicotine dependence: Secondary | ICD-10-CM | POA: Insufficient documentation

## 2013-09-27 DIAGNOSIS — K219 Gastro-esophageal reflux disease without esophagitis: Secondary | ICD-10-CM | POA: Insufficient documentation

## 2013-09-27 DIAGNOSIS — Z79899 Other long term (current) drug therapy: Secondary | ICD-10-CM | POA: Insufficient documentation

## 2013-09-27 DIAGNOSIS — F329 Major depressive disorder, single episode, unspecified: Secondary | ICD-10-CM | POA: Insufficient documentation

## 2013-09-27 DIAGNOSIS — Z8711 Personal history of peptic ulcer disease: Secondary | ICD-10-CM | POA: Insufficient documentation

## 2013-09-27 DIAGNOSIS — R52 Pain, unspecified: Secondary | ICD-10-CM | POA: Insufficient documentation

## 2013-09-27 DIAGNOSIS — K002 Abnormalities of size and form of teeth: Secondary | ICD-10-CM | POA: Insufficient documentation

## 2013-09-27 DIAGNOSIS — R079 Chest pain, unspecified: Secondary | ICD-10-CM | POA: Insufficient documentation

## 2013-09-27 DIAGNOSIS — I10 Essential (primary) hypertension: Secondary | ICD-10-CM | POA: Insufficient documentation

## 2013-09-27 DIAGNOSIS — E871 Hypo-osmolality and hyponatremia: Secondary | ICD-10-CM | POA: Insufficient documentation

## 2013-09-27 DIAGNOSIS — M542 Cervicalgia: Secondary | ICD-10-CM | POA: Insufficient documentation

## 2013-09-27 LAB — URINALYSIS, ROUTINE W REFLEX MICROSCOPIC
Bilirubin Urine: NEGATIVE
Glucose, UA: NEGATIVE mg/dL
Ketones, ur: 15 mg/dL — AB
Leukocytes, UA: NEGATIVE
Nitrite: NEGATIVE
Protein, ur: NEGATIVE mg/dL
Specific Gravity, Urine: 1.013 (ref 1.005–1.030)
Urobilinogen, UA: 0.2 mg/dL (ref 0.0–1.0)
pH: 7.5 (ref 5.0–8.0)

## 2013-09-27 LAB — CBC
HCT: 37.1 % — ABNORMAL LOW (ref 39.0–52.0)
Hemoglobin: 12.9 g/dL — ABNORMAL LOW (ref 13.0–17.0)
MCH: 29.9 pg (ref 26.0–34.0)
MCHC: 34.8 g/dL (ref 30.0–36.0)
MCV: 85.9 fL (ref 78.0–100.0)
PLATELETS: 326 10*3/uL (ref 150–400)
RBC: 4.32 MIL/uL (ref 4.22–5.81)
RDW: 11.9 % (ref 11.5–15.5)
WBC: 13 10*3/uL — AB (ref 4.0–10.5)

## 2013-09-27 LAB — BASIC METABOLIC PANEL
BUN: 7 mg/dL (ref 6–23)
CHLORIDE: 88 meq/L — AB (ref 96–112)
CO2: 25 meq/L (ref 19–32)
CREATININE: 0.66 mg/dL (ref 0.50–1.35)
Calcium: 9.8 mg/dL (ref 8.4–10.5)
GFR calc non Af Amer: >90 mL/min (ref >90)
Glucose, Bld: 103 mg/dL — ABNORMAL HIGH (ref 70–99)
POTASSIUM: 5.6 meq/L — AB (ref 3.7–5.3)
Sodium: 129 mEq/L — ABNORMAL LOW (ref 137–147)

## 2013-09-27 LAB — URINE MICROSCOPIC-ADD ON

## 2013-09-27 LAB — I-STAT TROPONIN, ED: TROPONIN I, POC: 0 ng/mL (ref 0.00–0.08)

## 2013-09-27 MED ORDER — ONDANSETRON 8 MG/NS 50 ML IVPB
8.0000 mg | Freq: Once | INTRAVENOUS | Status: DC
  Filled 2013-09-27: qty 8

## 2013-09-27 MED ORDER — KETOROLAC TROMETHAMINE 30 MG/ML IJ SOLN
30.0000 mg | Freq: Once | INTRAMUSCULAR | Status: AC
  Administered 2013-09-27: 30 mg via INTRAVENOUS
  Filled 2013-09-27: qty 1

## 2013-09-27 MED ORDER — LORAZEPAM 2 MG/ML IJ SOLN
1.0000 mg | Freq: Once | INTRAMUSCULAR | Status: AC
  Administered 2013-09-27: 1 mg via INTRAVENOUS
  Filled 2013-09-27: qty 1

## 2013-09-27 MED ORDER — PENICILLIN V POTASSIUM 500 MG PO TABS: 500.0000 mg | ORAL_TABLET | Freq: Four times a day (QID) | ORAL | Status: DC

## 2013-09-27 MED ORDER — ONDANSETRON HCL 4 MG/2ML IJ SOLN
4.0000 mg | Freq: Once | INTRAMUSCULAR | Status: AC
  Administered 2013-09-27: 4 mg via INTRAVENOUS
  Filled 2013-09-27: qty 2

## 2013-09-27 MED ORDER — SODIUM CHLORIDE 0.9 % IV BOLUS (SEPSIS)
1000.0000 mL | INTRAVENOUS | Status: AC
  Administered 2013-09-27: 1000 mL via INTRAVENOUS

## 2013-09-27 NOTE — ED Notes (Signed)
PA at bedside.

## 2013-09-27 NOTE — ED Provider Notes (Signed)
See prior note   Janice Norrie, MD 09/27/13 2052

## 2013-09-27 NOTE — ED Provider Notes (Signed)
CSN: [id number]     Arrival date & time 09/27/13  1453 History   First MD Initiated Contact with Patient 09/27/13 1608     Chief Complaint  Patient presents with  . Chest Pain  . Nausea  . Generalized Body Aches     (Consider location/radiation/quality/duration/timing/severity/associated sxs/prior Treatment) HPI Pt is a 58yo male with hx of HTN, GERD, depression, anxiety, chronic pain, gastritis, IBS, poor dentition, alcohol abuse in remission and esophagitis presenting to ED c/o generalized body aches, associated with nausea and chest pain. Pt states he believes he has symptoms consistent with MS, however has never been dx.    Pt states his arms and legs have a "restless" sensation. Body aches are constant, 7/10, generalized. He has an intermittent generalized headache since January 2015, pain is gradual in onset with episodes of sharp shooting pain.  Pt states he does have hx of gastric ulcers and feels like his stomach is tightening up causing nausea but denies vomiting or diarrhea.  Pt also c/o intermittent episodes of arm and leg "jerking" with tingling and weakness. States he was seen in January 2015 for similar symptoms, had negative head CT but was admitted for "dehydration." Nothing makes pain better or worse   Denies fevers. Denies sick contacts or recent travel.  Pt is in process of getting health insurance, however, until then, he cannot afford to see his PCP or his prescription medications.  Past Medical History  Diagnosis Date  . Hypertension   . GERD (gastroesophageal reflux disease)   . Depression   . Anxiety   . Chronic pain   . Gastritis   . Colitis   . IBS (irritable bowel syndrome)   . Poor dentition   . Alcohol abuse, in remission   . Esophagitis    Past Surgical History  Procedure Laterality Date  . Hernia repair Bilateral   . Tonsillectomy     Family History  Problem Relation Age of Onset  . Anxiety disorder Mother   . Congestive Heart Failure Mother   .  Crohn's disease Mother   . Arthritis Father   . High blood pressure Father    History  Substance Use Topics  . Smoking status: Former Smoker    Types: Cigarettes  . Smokeless tobacco: Never Used  . Alcohol Use: No    Review of Systems  Constitutional: Positive for fatigue. Negative for fever and chills.  Respiratory: Negative for cough and shortness of breath.   Cardiovascular: Positive for chest pain.  Gastrointestinal: Positive for nausea and abdominal pain. Negative for vomiting and diarrhea.  Musculoskeletal: Positive for arthralgias, back pain, myalgias and neck pain. Negative for neck stiffness.  Neurological: Positive for tremors, weakness and headaches. Negative for dizziness and light-headedness.  All other systems reviewed and are negative.     Allergies  Diphenhydramine hcl; Flexeril; and Nsaids  Home Medications   Prior to Admission medications   Medication Sig Start Date End Date Taking? Authorizing Provider  acetaminophen (TYLENOL) 500 MG tablet Take 1,000 mg by mouth every 6 (six) hours as needed for headache.    Historical Provider, MD  amoxicillin-clavulanate (AUGMENTIN) 875-125 MG per tablet Take 1 tablet by mouth 2 (two) times daily. One po bid x 7 days 06/23/13   Illene Labrador, PA-C  celecoxib (CELEBREX) 100 MG capsule Take 1 capsule (100 mg total) by mouth 2 (two) times daily. 06/22/13   Janece Canterbury, MD  feeding supplement, ENSURE, (ENSURE) PUDG Take 1 Container by mouth 3 (three)  times daily with meals. 06/22/13   Janece Canterbury, MD  hydrOXYzine (ATARAX/VISTARIL) 50 MG tablet Take 2 tablets (100 mg total) by mouth 3 (three) times daily. 08/08/13   Neena Rhymes, MD  LORazepam (ATIVAN) 0.5 MG tablet TAKE 1 TABLET BY MOUTH EVERY 6 HOURS AS NEEDED FOR BREAKTHROUGH ANXIETY    Neena Rhymes, MD  Multiple Vitamin (MULTIVITAMIN WITH MINERALS) TABS Take 1 tablet by mouth daily.    Historical Provider, MD  pantoprazole (PROTONIX) 40 MG tablet Take 1 tablet  (40 mg total) by mouth daily. 07/14/13   Neena Rhymes, MD  sucralfate (CARAFATE) 1 G tablet Take 1 tablet (1 g total) by mouth 4 (four) times daily -  with meals and at bedtime. Chew before swallowing 06/22/13   Janece Canterbury, MD  traMADol (ULTRAM) 50 MG tablet Take 1 tablet (50 mg total) by mouth 2 (two) times daily. 07/02/13   Neena Rhymes, MD  verapamil (CALAN-SR) 240 MG CR tablet Take 1 tablet (240 mg total) by mouth every evening. 07/14/13   Neena Rhymes, MD   BP 154/90  Pulse 81  Temp(Src) 97.6 F (36.4 C) (Oral)  Resp 20  SpO2 100% Physical Exam  Nursing note and vitals reviewed. Constitutional: He is oriented to person, place, and time. He appears well-developed and well-nourished.  Thin male, anxious appearing.  HENT:  Head: Normocephalic and atraumatic.  Mouth/Throat: Uvula is midline, oropharynx is clear and moist and mucous membranes are normal. Abnormal dentition. Dental caries present.  Diffuse dental decay. Front upper gingival edema causing left upper lip to become caught on front left incisor. No discharge or bleeding.  Teeth tender to touch. No airway involvement.  Eyes: Conjunctivae are normal. No scleral icterus.  Neck: Normal range of motion. Neck supple.  No nuchal rigidity or meningeal signs.  Cardiovascular: Normal rate, regular rhythm and normal heart sounds.   Pulmonary/Chest: Effort normal and breath sounds normal. No respiratory distress. He has no wheezes. He has no rales. He exhibits no tenderness.  No respiratory distress, able to speak in full sentences w/o difficulty. Lungs: CTAB  Abdominal: Soft. Bowel sounds are normal. He exhibits no distension and no mass. There is tenderness ( mild, diffuse). There is no rebound and no guarding.  Musculoskeletal: Normal range of motion.  Neurological: He is alert and oriented to person, place, and time. He has normal strength. No sensory deficit. GCS eye subscore is 4. GCS verbal subscore is 5. GCS motor  subscore is 6.  Left upper lip raised at rest.  Smile-symmetric. Eye brows symmetric.  Sensation of light and sharp touch on face-symmetric.  5/5 grip strength bilaterally. 5/5 plantarflexion and dorsiflexion bilaterally.   Skin: Skin is warm and dry.  Psychiatric: His mood appears anxious.    ED Course  Procedures (including critical care time) Labs Review Labs Reviewed  BASIC METABOLIC PANEL - Abnormal; Notable for the following:    Sodium 129 (*)    Potassium 5.6 (*)    Chloride 88 (*)    Glucose, Bld 103 (*)    All other components within normal limits  CBC - Abnormal; Notable for the following:    WBC 13.0 (*)    Hemoglobin 12.9 (*)    HCT 37.1 (*)    All other components within normal limits  URINALYSIS, ROUTINE W REFLEX MICROSCOPIC - Abnormal; Notable for the following:    Hgb urine dipstick SMALL (*)    Ketones, ur 15 (*)    All  other components within normal limits  URINE MICROSCOPIC-ADD ON  I-STAT TROPOININ, ED    Imaging Review No results found.   EKG Interpretation   Date/Time:  Saturday September 27 2013 15:38:27 EDT Ventricular Rate:  77 PR Interval:  150 QRS Duration: 100 QT Interval:  406 QTC Calculation: 459 R Axis:   67 Text Interpretation:  Normal sinus rhythm Nonspecific ST abnormality No  significant change since last tracing Confirmed by Ashok Cordia  MD, Lennette Bihari  ([id number]) on 09/27/2013 3:45:10 PM      MDM   Final diagnoses:  Body aches  Chest pain  Nausea  Hyponatremia  Dental decay    Pt is a 58yo male presenting to ED with multiple complaints including generalized body aches and weakness.  Pt has extensive dental decay as well.  On exam Pt appears anxious but non-toxic. No respiratory distress. Afebrile. Normal neuro exam.  Reviewed pt's medical records where pt was admitted 06/23/13 for similar symptoms including dehydration and hyponatremia. At that time pt had Head CT: unremarkable.  Low concern for emergent process taking place, symptoms sound  chronic in nature.  Labs: CBC, BMP, i-stat troponin, and UA ordered.  No significant change since previous labs.  Pt given 1L IV fluids. Pt also given ativan, toradol and zofran.  States he feels mildly improved.  Pt able to keep down fluids and a sandwich.      Discussed pt with Dr. Tomi Bamberger who also examined pt. Will tx for dental decay with PCN.  Pt concerned he cannot afford his current prescriptions or new antibiotic.  Attempted to contact case management as well as social work w/o response.  Provided pt with resource guide, including contact information for [facility] and [facility].  Encouraged good hydration, proper nutrition, and rest.  Return precautions provided. Pt verbalized understanding and agreement with tx plan.     Noland Fordyce, PA-C 09/27/13 1951

## 2013-09-27 NOTE — ED Notes (Addendum)
Pt presents with c/o generalized body aches. Pt says his stomach feels like it is tightening up and he is nauseated. Pt also says he feels neck pain, head pain, arm pain, and leg pain. Pt says he has had these symptoms before and believes it may be MS. Pt also c/o chest pain at this time.

## 2013-09-27 NOTE — ED Provider Notes (Signed)
Patient reports multiple symptoms such as hurting in his arms and legs. He has had nausea. He states he is having a left lip issue because of discomfort in his body. He denies any numbness in his arms or legs. Patient states he has been shaking.  Patient is ill kept. He appears older than his stated age. He has extremely poor dentition with several missing teeth, many broken teeth, redness and swelling of his gums. He also has a lot of part her buildup in his lower teeth. He seems to have his left upper lip looked over his eye tooth on the left. When I lift the lip up and On that tooth it's painful. He has a lot of swelling of the gums in that area. Patient has no evidence of cranial nerve 5 deficit i.e. his eyelid strength is normal, his smile is symmetrical, and he has no numbness to his face. Patient is moving his arms and legs equally and well. He has no obvious tremor at this time.  Medical screening examination/treatment/procedure(s) were conducted as a shared visit with non-physician practitioner(s) and myself.  I personally evaluated the patient during the encounter.   EKG Interpretation   Date/Time:  Saturday September 27 2013 15:38:27 EDT Ventricular Rate:  77 PR Interval:  150 QRS Duration: 100 QT Interval:  406 QTC Calculation: 459 R Axis:   67 Text Interpretation:  Normal sinus rhythm Nonspecific ST abnormality No  significant change since last tracing Confirmed by Ashok Cordia  MD, Lennette Bihari  ([id number]) on 09/27/2013 3:45:10 PM       Rolland Porter, MD, Alanson Aly, MD 09/27/13 1750

## 2013-09-27 NOTE — Discharge Instructions (Signed)
Be sure to keep well hydrated with at least 8 glasses of water a day and get at least 8 hours of sleep per night to help with overall weakness and fatigue.  Follow up with the [place] and see resource guide below for help with prescription medication and ongoing healthcare needs.  Emergency Department Resource Guide 1) Find a Doctor and Pay Out of Pocket Although you won't have to find out who is covered by your insurance plan, it is a good idea to ask around and get recommendations. You will then need to call the office and see if the doctor you have chosen will accept you as a new patient and what types of options they offer for patients who are self-pay. Some doctors offer discounts or will set up payment plans for their patients who do not have insurance, but you will need to ask so you aren't surprised when you get to your appointment.  2) Contact Your Local Health Department Not all health departments have doctors that can see patients for sick visits, but many do, so it is worth a call to see if yours does. If you don't know where your local health department is, you can check in your phone book. The CDC also has a tool to help you locate your state's health department, and many state websites also have listings of all of their local health departments.  3) Find a [facility] If your illness is not likely to be very severe or complicated, you may want to try a walk in clinic. These are popping up all over the country in pharmacies, drugstores, and shopping centers. They're usually staffed by nurse practitioners or physician assistants that have been trained to treat common illnesses and complaints. They're usually fairly quick and inexpensive. However, if you have serious medical issues or chronic medical problems, these are probably not your best option.  No Primary Care Doctor: - Call Health Connect at  [phone] - they can help you locate a primary care doctor that   accepts your insurance, provides certain services, etc. - Physician Referral Service- [phone]  Chronic Pain Problems: Organization         Address  Phone   Notes  [facility]  [phone] Patients need to be referred by their primary care doctor.   Medication Assistance: Organization         Address  Phone   Notes  [facility] [address] [place], [place] [address] [phone] --Must be a resident of [facility] -- Must have NO insurance coverage whatsoever (no Medicaid/ Medicare, etc.) -- The pt. MUST have a primary care doctor that directs their care regularly and follows them in the community   MedAssist  [phone]   [employer]  [phone]    Agencies that provide inexpensive medical care: Microbiologist  Notes  [place]  [phone]   [facility]    [phone]   [facility] [place], [place] [address] [phone]   [place] [address] [place] [phone]   Planned Parenthood    [phone]   [facility]    [phone]   [place] and [place] [address], [place] Phone:  [phone], Fax:  [phone] Hours of Operation:  9 am - 6 pm, M-F.  Also accepts Medicaid/Medicare and self-pay.  [facility] for [place] [address], [address], [place] Phone: [phone], Fax: [phone]. Hours of Operation:  8:30 am - 5:30 pm, M-F.  Also accepts Medicaid and self-pay.  [facility] [place] [address], [place] Phone: [phone]   [place], [place], [place] [phone], Ext. [phone] Mondays & Thursdays: 7-9 AM.  First 15  patients are seen on a first come, first serve basis.    [place] Providers:  Organization         Address                                                                       Phone                               Notes  [facility] [address], [place] [phone] Also accepts self-pay patients.  [facility] [address], [place]  [phone]   [place], [address], [place] [phone]   [facility] [address] [place] [phone]   Lucianne Lei [address], [place]   [phone] Only accepts [place] Access [place] patients after they have their name applied to their card.   Self-Pay (no insurance) in [facility]:   Organization         Address                                                     Phone               Notes  Sickle Cell Patients, [facility] [place] [phone]   [facility] [place] [phone]   [facility] [place]  [place], [place], [place] [phone]   [facility]/Dr. Osei-Bonsu  [address] [place] or [address], [place] [phone] Phone number for both [place] and [place] locations is the same.  Urgent Medical and [facility] [address], [place] [phone]   [facility] [address], [place] or [address] [phone]   [facility] [place] [phone], phone; [phone], fax Sees patients 1st and 3rd Saturday of every month.  Must not qualify for public or private insurance (i.e. Medicaid, Medicare, [place] Health Choice, Veterans' Benefits)  Household income should be no more than 200% of the poverty level  The clinic cannot treat you if you are pregnant or think you are pregnant  Sexually transmitted diseases are not treated at the clinic.    Dental Care: Organization         Address                                  Phone                       Notes  [facility] [place] [phone] Accepts children up to age 53 who are enrolled in [place] or [place]; pregnant women with a Medicaid card; and children who have applied for Medicaid or [place] Health Choice, but were declined, whose parents can pay a reduced fee at time of service.  [facility] Department of [facility]  [address], [place] [phone] Accepts children up to age 71 who are enrolled in [place] or [place]; pregnant women with a Medicaid card; and children who have applied for Medicaid or [place] Health Choice, but were declined, whose parents can pay a reduced fee at time of service.  [place] Adult [facility] Access PROGRAM  [place] [phone] Patients are seen by appointment only. Walk-ins are not accepted. [place] will see patients 50 years of age and older. Monday - Tuesday (8am-5pm) Most Wednesdays (8:30-5pm) $30 per visit, cash only  [facility] Adult Dental Access PROGRAM  [address], [facility] [phone] Patients are seen by appointment only. Walk-ins are not accepted. [place] will see patients 57 years of age and older. One Wednesday Evening (Monthly: Volunteer Based).  $30 per visit, cash only  [place]  [phone] for adults; Children under age 49, call Graduate Pediatric Dentistry at [phone]. Children aged 17-14, please call [phone] to request a pediatric application.  Dental services are provided in all areas of dental care including fillings, crowns and bridges, complete and partial dentures, implants, gum  treatment, root canals, and extractions. Preventive care is also provided. Treatment is provided to both adults and children. Patients are selected via a lottery and there is often a waiting list.   [facility] [address], [place]  [phone] [email or web address]   [facility] [address] [place], [place] [phone], Ext. [phone] Second and Fourth Thursday of each month, opens at 6:30 AM; Clinic ends at 9 AM.  Patients are seen on a first-come first-served basis, and a limited number are seen during each clinic.   [facility]  [address] Hillard Danker [place], [place] [phone]   Eligibility Requirements You must have lived in [place], [place], or [place] counties for at least the last three months.   You cannot be eligible for state or federal sponsored [employer], including [employer], [place], or [employer].   You generally cannot be eligible for healthcare insurance through your employer.    How to apply: Eligibility screenings are held every  Tuesday and Wednesday afternoon from 1:00 pm until 4:00 pm. You do not need an appointment for the interview!  [facility] [address] [place], [place]   [place]  [place] Department  [place]  [phone]    Behavioral Health Resources in the Community: Intensive Outpatient Programs Organization         Address                                              Phone              Notes  [place]. [address] [place], [place] [phone]   [facility] [address], [place], [place]   ADS: Alcohol & Drug Svcs [address], [place], [place]   [place] [address],  [place], [place] or [phone]   Substance Abuse  Resources Organization         Address                                Phone  Notes  Alcohol [facility] Drug Services  [phone]   [place]  [phone]   The [place]   Chinita Pester  [phone]   Residential & Outpatient Substance Abuse Program  [phone]   Psychological Services Organization         Address                                  Phone                Notes  [facility] [place]  [place]  [phone]   [place] [address] [place] or [phone]    Mobile Crisis Teams Organization         Address  Phone  Notes  [facility], Mobile Crisis Care Unit  [phone]   [facility]  [address] [place], [place]   [place] [address] [place] [phone]    Self-Help/Support Groups Organization         Address                         Phone             Notes  [place]. of [place] - variety of support groups  [place] Call for more information  Narcotics Anonymous (NA), [facility] [address], [employer] [place]  2 meetings at this location   Special educational needs teacher         Address                                                    Phone              Notes  [facility] [phone]  [address]    [place]  [phone]   [facility]  [address], [place] [address]70488, [place], [place]   [place], [place] [phone] Admissions: 8am-3pm M-F  Incentives Substance [place] [address],    [place], [place] [phone]   [facility] [address] [place], [place], [place]   [facility] [address]  [place], [place]   [facility]ight Programs - Intensive Outpatient [address], [place], [place]    [facility] ([place].) [place].,  [place], [place] [phone] or [phone]   [facility] (RTS) [address] [place], [place] Accepts Medicaid  [facility] [place] [address]  [place] [phone] Substance Abuse/Addiction Treatment   [facility] Organization         Address                                                            Phone                    Notes  [facility]  [phone]   Domenic Schwab, PhD [address] [place]lis [place], [place]   [phone] or [phone]   [place], [place] [phone]   [facility] [address], [place], [place] [phone] Insurance/Medicaid/sponsorship through [facility] and Families [address] [place]                                    [place], [place] [phone] [place] [address], [place] [phone]    Dr. Adele Schilder  [phone]   [facility] Dept. 1) [address], [address] 2) [place] 3)  [place] [address], [place] [phone]  [phone]   [place] [phone] or [phone] (After Hours)

## 2013-09-28 NOTE — Progress Notes (Signed)
  CARE MANAGEMENT ED NOTE 09/28/2013  Patient:  HERNDON, GRILL   Account Number:  [id number]  Date Initiated:  09/28/2013  Documentation initiated by:  [facility]  Subjective/Objective Assessment:   numbness, shaking     Subjective/Objective Assessment Detail:     Action/Plan:   PCP, medication needs   Action/Plan Detail:   Anticipated DC Date:  09/27/2013     Status Recommendation to Physician:   Result of Recommendation:      DC Planning Services  CM consult  Medication Assistance    Choice offered to / List presented to:            Status of service:  Completed, signed off  ED Comments:  09/28/2013 1630 NCM attempted call to number listed in records. phone was disconnected. NCM attempted to follow up with pt on meds and PCP. Jonnie Finner RN CCM Case Mgmt phone [phone]  ED Comments Detail:

## 2013-10-07 ENCOUNTER — Other Ambulatory Visit: Payer: Self-pay | Admitting: *Deleted

## 2013-10-07 MED ORDER — HYDROXYZINE HCL 50 MG PO TABS: 100.0000 mg | ORAL_TABLET | Freq: Three times a day (TID) | ORAL | Status: DC

## 2013-10-20 ENCOUNTER — Other Ambulatory Visit: Payer: Self-pay | Admitting: *Deleted

## 2013-10-20 MED ORDER — TRAMADOL HCL 50 MG PO TABS: 50.0000 mg | ORAL_TABLET | Freq: Two times a day (BID) | ORAL | Status: DC

## 2013-10-20 NOTE — Telephone Encounter (Signed)
Formal pt on Dr. Linda Hedges ... Pls advise on refill...Johny Chess

## 2013-10-20 NOTE — Telephone Encounter (Signed)
Faxed script back to pharmacy...Johny Chess

## 2013-11-26 ENCOUNTER — Other Ambulatory Visit: Payer: Self-pay | Admitting: Internal Medicine

## 2013-12-16 ENCOUNTER — Ambulatory Visit: Payer: Self-pay | Admitting: Internal Medicine

## 2013-12-17 ENCOUNTER — Other Ambulatory Visit (INDEPENDENT_AMBULATORY_CARE_PROVIDER_SITE_OTHER): Payer: Self-pay

## 2013-12-17 ENCOUNTER — Ambulatory Visit (INDEPENDENT_AMBULATORY_CARE_PROVIDER_SITE_OTHER): Payer: Self-pay | Admitting: Internal Medicine

## 2013-12-17 ENCOUNTER — Telehealth: Payer: Self-pay | Admitting: Internal Medicine

## 2013-12-17 VITALS — BP 136/78 | HR 75 | Temp 98.1°F | Resp 16 | Ht 71.0 in | Wt 151.8 lb

## 2013-12-17 DIAGNOSIS — K921 Melena: Secondary | ICD-10-CM

## 2013-12-17 DIAGNOSIS — J0120 Acute ethmoidal sinusitis, unspecified: Secondary | ICD-10-CM

## 2013-12-17 DIAGNOSIS — K279 Peptic ulcer, site unspecified, unspecified as acute or chronic, without hemorrhage or perforation: Secondary | ICD-10-CM

## 2013-12-17 DIAGNOSIS — J0121 Acute recurrent ethmoidal sinusitis: Secondary | ICD-10-CM

## 2013-12-17 DIAGNOSIS — Z91199 Patient's noncompliance with other medical treatment and regimen due to unspecified reason: Secondary | ICD-10-CM

## 2013-12-17 DIAGNOSIS — F418 Other specified anxiety disorders: Secondary | ICD-10-CM

## 2013-12-17 DIAGNOSIS — I10 Essential (primary) hypertension: Secondary | ICD-10-CM

## 2013-12-17 DIAGNOSIS — K589 Irritable bowel syndrome without diarrhea: Secondary | ICD-10-CM

## 2013-12-17 DIAGNOSIS — E785 Hyperlipidemia, unspecified: Secondary | ICD-10-CM

## 2013-12-17 DIAGNOSIS — Z23 Encounter for immunization: Secondary | ICD-10-CM

## 2013-12-17 DIAGNOSIS — F0390 Unspecified dementia without behavioral disturbance: Secondary | ICD-10-CM

## 2013-12-17 DIAGNOSIS — Z860100 Personal history of colon polyps, unspecified: Secondary | ICD-10-CM

## 2013-12-17 DIAGNOSIS — D649 Anemia, unspecified: Secondary | ICD-10-CM

## 2013-12-17 DIAGNOSIS — M549 Dorsalgia, unspecified: Secondary | ICD-10-CM

## 2013-12-17 DIAGNOSIS — M459 Ankylosing spondylitis of unspecified sites in spine: Secondary | ICD-10-CM

## 2013-12-17 DIAGNOSIS — N400 Enlarged prostate without lower urinary tract symptoms: Secondary | ICD-10-CM

## 2013-12-17 DIAGNOSIS — F341 Dysthymic disorder: Secondary | ICD-10-CM

## 2013-12-17 DIAGNOSIS — E559 Vitamin D deficiency, unspecified: Secondary | ICD-10-CM

## 2013-12-17 DIAGNOSIS — Z9119 Patient's noncompliance with other medical treatment and regimen: Secondary | ICD-10-CM

## 2013-12-17 LAB — CBC WITH DIFFERENTIAL/PLATELET
Basophils Absolute: 0.1 10*3/uL (ref 0.0–0.1)
Basophils Relative: 0.5 % (ref 0.0–3.0)
EOS PCT: 0.6 % (ref 0.0–5.0)
Eosinophils Absolute: 0.1 10*3/uL (ref 0.0–0.7)
HCT: 37.2 % — ABNORMAL LOW (ref 39.0–52.0)
HEMOGLOBIN: 12.5 g/dL — AB (ref 13.0–17.0)
LYMPHS PCT: 17.2 % (ref 12.0–46.0)
Lymphs Abs: 2 10*3/uL (ref 0.7–4.0)
MCHC: 33.7 g/dL (ref 30.0–36.0)
MCV: 88.2 fl (ref 78.0–100.0)
MONOS PCT: 7.3 % (ref 3.0–12.0)
Monocytes Absolute: 0.8 10*3/uL (ref 0.1–1.0)
NEUTROS ABS: 8.5 10*3/uL — AB (ref 1.4–7.7)
NEUTROS PCT: 74.4 % (ref 43.0–77.0)
Platelets: 433 10*3/uL — ABNORMAL HIGH (ref 150.0–400.0)
RBC: 4.22 Mil/uL (ref 4.22–5.81)
RDW: 13.9 % (ref 11.5–15.5)
WBC: 11.4 10*3/uL — AB (ref 4.0–10.5)

## 2013-12-17 LAB — URINALYSIS, ROUTINE W REFLEX MICROSCOPIC
BILIRUBIN URINE: NEGATIVE
Ketones, ur: NEGATIVE
LEUKOCYTES UA: NEGATIVE
Nitrite: NEGATIVE
PH: 7.5 (ref 5.0–8.0)
SPECIFIC GRAVITY, URINE: 1.01 (ref 1.000–1.030)
TOTAL PROTEIN, URINE-UPE24: NEGATIVE
Urine Glucose: NEGATIVE
Urobilinogen, UA: 0.2 (ref 0.0–1.0)

## 2013-12-17 MED ORDER — SUCRALFATE 1 G PO TABS: 1.0000 g | ORAL_TABLET | Freq: Three times a day (TID) | ORAL | Status: DC

## 2013-12-17 MED ORDER — PANTOPRAZOLE SODIUM 40 MG PO TBEC: 40.0000 mg | DELAYED_RELEASE_TABLET | Freq: Every day | ORAL | Status: DC

## 2013-12-17 MED ORDER — LORAZEPAM 0.5 MG PO TABS: 0.5000 mg | ORAL_TABLET | Freq: Four times a day (QID) | ORAL | Status: DC | PRN

## 2013-12-17 MED ORDER — CEFUROXIME AXETIL 500 MG PO TABS: 500.0000 mg | ORAL_TABLET | Freq: Two times a day (BID) | ORAL | Status: DC

## 2013-12-17 MED ORDER — TRAMADOL HCL 50 MG PO TABS: 50.0000 mg | ORAL_TABLET | Freq: Two times a day (BID) | ORAL | Status: DC

## 2013-12-17 NOTE — Telephone Encounter (Signed)
Relevant patient education mailed to patient.  

## 2013-12-17 NOTE — Progress Notes (Signed)
Pre visit review using our clinic review tool, if applicable. No additional management support is needed unless otherwise documented below in the visit note. 

## 2013-12-17 NOTE — Patient Instructions (Signed)
Anemia, Nonspecific Anemia is a condition in which the concentration of red blood cells or hemoglobin in the blood is below normal. Hemoglobin is a substance in red blood cells that carries oxygen to the tissues of the body. Anemia results in not enough oxygen reaching these tissues.  CAUSES  Common causes of anemia include:   Excessive bleeding. Bleeding may be internal or external. This includes excessive bleeding from periods (in women) or from the intestine.   Poor nutrition.   Chronic kidney, thyroid, and liver disease.  Bone marrow disorders that decrease red blood cell production.  Cancer and treatments for cancer.  HIV, AIDS, and their treatments.  Spleen problems that increase red blood cell destruction.  Blood disorders.  Excess destruction of red blood cells due to infection, medicines, and autoimmune disorders. SIGNS AND SYMPTOMS   Minor weakness.   Dizziness.   Headache.  Palpitations.   Shortness of breath, especially with exercise.   Paleness.  Cold sensitivity.  Indigestion.  Nausea.  Difficulty sleeping.  Difficulty concentrating. Symptoms may occur suddenly or they may develop slowly.  DIAGNOSIS  Additional blood tests are often needed. These help your health care provider determine the best treatment. Your health care provider will check your stool for blood and look for other causes of blood loss.  TREATMENT  Treatment varies depending on the cause of the anemia. Treatment can include:   Supplements of iron, vitamin B12, or folic acid.   Hormone medicines.   A blood transfusion. This may be needed if blood loss is severe.   Hospitalization. This may be needed if there is significant continual blood loss.   Dietary changes.  Spleen removal. HOME CARE INSTRUCTIONS Keep all follow-up appointments. It often takes many weeks to correct anemia, and having your health care provider check on your condition and your response to  treatment is very important. SEEK IMMEDIATE MEDICAL CARE IF:   You develop extreme weakness, shortness of breath, or chest pain.   You become dizzy or have trouble concentrating.  You develop heavy vaginal bleeding.   You develop a rash.   You have bloody or black, tarry stools.   You faint.   You vomit up blood.   You vomit repeatedly.   You have abdominal pain.  You have a fever or persistent symptoms for more than 2-3 days.   You have a fever and your symptoms suddenly get worse.   You are dehydrated.  MAKE SURE YOU:  Understand these instructions.  Will watch your condition.  Will get help right away if you are not doing well or get worse. Document Released: 06/29/2004 Document Revised: 01/22/2013 Document Reviewed: 11/15/2012 ExitCare Patient Information 2015 [employer]. This information is not intended to replace advice given to you by your health care provider. Make sure you discuss any questions you have with your health care provider.  

## 2013-12-17 NOTE — Progress Notes (Signed)
Subjective:    Patient ID: Anthony Lamb, male    DOB: 06/14/1955, 58 y.o.   MRN: [id number]  HPI Comments: New to me, has multiple complaints and issues.  Anemia Presents for follow-up visit. Symptoms include abdominal pain (chronic, diffuse, unchanged). There has been no anorexia, bruising/bleeding easily, confusion, fever, leg swelling, light-headedness, malaise/fatigue, pallor, palpitations, paresthesias, pica or weight loss. Signs of blood loss that are present include hematochezia. Signs of blood loss that are not present include melena. Past treatments include nothing. Past medical history includes alcohol abuse, inflammatory bowel disease and malnutrition. Procedure history includes colonoscopy. Compliance problems include psychosocial issues.       Review of Systems  Constitutional: Negative.  Negative for fever, chills, weight loss, malaise/fatigue, diaphoresis, activity change, appetite change, fatigue and unexpected weight change.  HENT: Positive for congestion, dental problem, postnasal drip, rhinorrhea (thick green phlegm) and sinus pressure. Negative for drooling, ear discharge, ear pain, facial swelling, hearing loss, mouth sores, nosebleeds, sneezing, sore throat, tinnitus, trouble swallowing and voice change.   Eyes: Negative.   Respiratory: Negative.  Negative for cough, choking, chest tightness, shortness of breath and stridor.   Cardiovascular: Negative.  Negative for chest pain, palpitations and leg swelling.  Gastrointestinal: Positive for abdominal pain (chronic, diffuse, unchanged), blood in stool, hematochezia and anal bleeding. Negative for nausea, vomiting, diarrhea, constipation, melena, abdominal distention, rectal pain and anorexia.  Endocrine: Negative.   Genitourinary: Negative.   Musculoskeletal: Positive for back pain (diffuse, chronic, aching in his lower back). Negative for arthralgias, gait problem, joint swelling, myalgias, neck pain and neck  stiffness.  Skin: Negative.  Negative for pallor and rash.  Allergic/Immunologic: Negative.   Neurological: Negative.  Negative for dizziness, tremors, syncope, weakness, light-headedness, numbness, headaches and paresthesias.  Hematological: Negative.  Negative for adenopathy. Does not bruise/bleed easily.  Psychiatric/Behavioral: Positive for sleep disturbance and decreased concentration. Negative for suicidal ideas, hallucinations, confusion, self-injury and dysphoric mood. The patient is nervous/anxious. The patient is not hyperactive.        Objective:   Physical Exam  Vitals reviewed. Constitutional: He is oriented to person, place, and time. He appears well-developed and well-nourished. No distress.  HENT:  Head: Normocephalic and atraumatic.  Nose: No mucosal edema, rhinorrhea, sinus tenderness or nasal septal hematoma. No epistaxis. Right sinus exhibits no maxillary sinus tenderness and no frontal sinus tenderness. Left sinus exhibits no maxillary sinus tenderness and no frontal sinus tenderness.  Mouth/Throat: Oropharynx is clear and moist. Mucous membranes are pale, not dry and not cyanotic. He does not have dentures. No oral lesions. No trismus in the jaw. Abnormal dentition. Dental caries present. No dental abscesses, uvula swelling or lacerations. No oropharyngeal exudate, posterior oropharyngeal edema, posterior oropharyngeal erythema or tonsillar abscesses.  Very very poor dentition  Eyes: Conjunctivae are normal. Right eye exhibits no discharge. Left eye exhibits no discharge. No scleral icterus.  Neck: Normal range of motion. Neck supple. No JVD present. No tracheal deviation present. No thyromegaly present.  Cardiovascular: Normal rate, regular rhythm, normal heart sounds and intact distal pulses.  Exam reveals no gallop and no friction rub.   No murmur heard. Pulmonary/Chest: Effort normal and breath sounds normal. No stridor. No respiratory distress. He has no wheezes. He  has no rales. He exhibits no tenderness.  Abdominal: Soft. Bowel sounds are normal. He exhibits no distension and no mass. There is no tenderness. There is no rebound and no guarding.  Musculoskeletal: Normal range of motion. He exhibits no edema.  Lymphadenopathy:    He has no cervical adenopathy.  Neurological: He is alert and oriented to person, place, and time. He has normal reflexes. He displays normal reflexes. No cranial nerve deficit. Coordination normal.  Skin: Skin is warm and dry. No rash noted. He is not diaphoretic. No erythema. No pallor.  Psychiatric: He has a normal mood and affect. Judgment and thought content normal. His mood appears not anxious. His affect is not angry, not blunt, not labile and not inappropriate. His speech is delayed. His speech is not rapid and/or pressured, not tangential and not slurred. He is slowed. He is not agitated, not aggressive, not hyperactive, not withdrawn and not combative. Thought content is not paranoid and not delusional. Cognition and memory are impaired. He does not express inappropriate judgment. He does not exhibit a depressed mood. He expresses no homicidal and no suicidal ideation. He expresses no suicidal plans and no homicidal plans. He is communicative. He exhibits abnormal recent memory and abnormal remote memory. He is inattentive.     Lab Results  Component Value Date   WBC 13.0* 09/27/2013   HGB 12.9* 09/27/2013   HCT 37.1* 09/27/2013   PLT 326 09/27/2013   GLUCOSE 103* 09/27/2013   ALT 16 06/28/2013   AST 15 06/28/2013   NA 129* 09/27/2013   K 5.6* 09/27/2013   CL 88* 09/27/2013   CREATININE 0.66 09/27/2013   BUN 7 09/27/2013   CO2 25 09/27/2013   TSH 0.826 06/20/2013       Assessment & Plan:

## 2013-12-18 ENCOUNTER — Encounter: Payer: Self-pay | Admitting: Internal Medicine

## 2013-12-18 DIAGNOSIS — E559 Vitamin D deficiency, unspecified: Secondary | ICD-10-CM | POA: Insufficient documentation

## 2013-12-18 DIAGNOSIS — F0390 Unspecified dementia without behavioral disturbance: Secondary | ICD-10-CM | POA: Insufficient documentation

## 2013-12-18 LAB — LIPID PANEL
CHOLESTEROL: 192 mg/dL (ref 0–200)
HDL: 51.3 mg/dL (ref >39.00)
LDL CALC: 126 mg/dL — AB (ref 0–99)
NonHDL: 140.7
TRIGLYCERIDES: 76 mg/dL (ref 0.0–149.0)
Total CHOL/HDL Ratio: 4
VLDL: 15.2 mg/dL (ref 0.0–40.0)

## 2013-12-18 LAB — COMPREHENSIVE METABOLIC PANEL
ALBUMIN: 4 g/dL (ref 3.5–5.2)
ALT: 13 U/L (ref 0–53)
AST: 16 U/L (ref 0–37)
Alkaline Phosphatase: 95 U/L (ref 39–117)
BUN: 6 mg/dL (ref 6–23)
CALCIUM: 9.4 mg/dL (ref 8.4–10.5)
CHLORIDE: 96 meq/L (ref 96–112)
CO2: 29 mEq/L (ref 19–32)
CREATININE: 0.7 mg/dL (ref 0.4–1.5)
GFR: 127.42 mL/min (ref >60.00)
GLUCOSE: 94 mg/dL (ref 70–99)
POTASSIUM: 4.6 meq/L (ref 3.5–5.1)
Sodium: 134 mEq/L — ABNORMAL LOW (ref 135–145)
Total Bilirubin: 0.2 mg/dL (ref 0.2–1.2)
Total Protein: 7 g/dL (ref 6.0–8.3)

## 2013-12-18 LAB — PSA: PSA: 1.25 ng/mL (ref 0.10–4.00)

## 2013-12-18 LAB — VITAMIN B12: Vitamin B-12: 429 pg/mL (ref 211–911)

## 2013-12-18 LAB — VITAMIN D 25 HYDROXY (VIT D DEFICIENCY, FRACTURES): VITD: 26.62 ng/mL

## 2013-12-18 LAB — FOLATE: Folate: >24.8 ng/mL (ref >5.9)

## 2013-12-18 LAB — IBC PANEL
Iron: 52 ug/dL (ref 42–165)
SATURATION RATIOS: 16.2 % — AB (ref 20.0–50.0)
Transferrin: 229.2 mg/dL (ref 212.0–360.0)

## 2013-12-18 LAB — FERRITIN: Ferritin: 73.5 ng/mL (ref 22.0–322.0)

## 2013-12-18 LAB — TSH: TSH: 0.99 u[IU]/mL (ref 0.35–4.50)

## 2013-12-18 MED ORDER — CHOLECALCIFEROL 1.25 MG (50000 UT) PO TABS: 1.0000 | ORAL_TABLET | ORAL | Status: DC

## 2013-12-18 NOTE — Assessment & Plan Note (Signed)
His BP is well controlled 

## 2013-12-18 NOTE — Assessment & Plan Note (Signed)
GI referral as he tells me that he has a hx of "colitis"

## 2013-12-18 NOTE — Assessment & Plan Note (Signed)
Will cont tramadol as needed for pain

## 2013-12-18 NOTE — Assessment & Plan Note (Signed)
Neurology referral

## 2013-12-18 NOTE — Assessment & Plan Note (Signed)
Change nexium to protonix at this request GI referral

## 2013-12-18 NOTE — Assessment & Plan Note (Signed)
Cont ativan as needed

## 2013-12-18 NOTE — Assessment & Plan Note (Signed)
CBC is stable He is Vit D deficient but B12/folate/iron are normal I have asked him to see GI

## 2013-12-18 NOTE — Assessment & Plan Note (Signed)
Will start Vit D supplement

## 2013-12-18 NOTE — Assessment & Plan Note (Signed)
Will treat the infection with ceftin and have asked him to see ENT for further evaluation

## 2013-12-22 ENCOUNTER — Telehealth: Payer: Self-pay

## 2013-12-22 NOTE — Telephone Encounter (Signed)
Patient called to request lab results, I advised of results per lettered mailed to him on 12/18/13. 1) Pt states that he will not have insurance until September and would like to know if there is any advisment that can be given without being seen. Patient states that he will come in if absolutely needed. 2) Patient also states that the really likes Dr. Ronnald Ramp and would like to know if he would accept him as his patient. Please advise thanks

## 2013-12-29 NOTE — Telephone Encounter (Signed)
Yes, I am willing to be his doctor. He should return for further testing when his insurance is in place.

## 2013-12-29 NOTE — Telephone Encounter (Signed)
LMOVM

## 2013-12-30 ENCOUNTER — Other Ambulatory Visit: Payer: Self-pay | Admitting: Internal Medicine

## 2014-01-06 ENCOUNTER — Ambulatory Visit: Payer: Self-pay | Admitting: Internal Medicine

## 2014-02-02 ENCOUNTER — Encounter: Payer: Self-pay | Admitting: Internal Medicine

## 2014-02-12 ENCOUNTER — Other Ambulatory Visit: Payer: Self-pay | Admitting: Internal Medicine

## 2014-02-16 ENCOUNTER — Telehealth: Payer: Self-pay | Admitting: *Deleted

## 2014-02-16 NOTE — Telephone Encounter (Signed)
Pt states he can not afford to come in for appt he has no insurance. He has fill-out for medicaid but haven't heard back on approval status. Pt is wanting to see if md can approve his lorazepam until he can make appt...Anthony Lamb

## 2014-02-18 ENCOUNTER — Telehealth: Payer: Self-pay | Admitting: Internal Medicine

## 2014-02-18 NOTE — Telephone Encounter (Signed)
Dr. Ronnald Ramp pls advise to [facility] sent 02/16/14  Earnstine Regal, MA at 02/16/2014 4:49 PM     Status: Signed        Pt states he can not afford to come in for appt he has no insurance. He has fill-out for medicaid but haven't heard back on approval status. Pt is wanting to see if md can approve his lorazepam until he can make appt...Johny Chess

## 2014-02-18 NOTE — Telephone Encounter (Signed)
Is requesting follow up call in regards to last phone note entered into system.  Looks like note was closed out.

## 2014-02-19 NOTE — Telephone Encounter (Signed)
Pt called again [name] requesting status on refilling his anxiety med. Called pt back no answer LMOM md is not going to refill lorazepam without OV.(see previous msg)  Pls call to schedule f/u appt wth Dr. Ronnald Ramp...Anthony Lamb

## 2014-02-20 NOTE — Telephone Encounter (Signed)
Spoke w/pt regarding neurology referral. He states no one has called him back regarding his lorazepam. I advised patient that we left a message for him yesterday and he would need an office visit before obtaining any refills on his medication. He states he does not have any money to come in for an office visit. He stated that he doesn't know what to do and can't sleep then he hung up on me.

## 2014-02-24 ENCOUNTER — Telehealth: Payer: Self-pay | Admitting: Internal Medicine

## 2014-02-24 DIAGNOSIS — F418 Other specified anxiety disorders: Secondary | ICD-10-CM

## 2014-02-24 MED ORDER — LORAZEPAM 0.5 MG PO TABS: 0.5000 mg | ORAL_TABLET | Freq: Four times a day (QID) | ORAL | Status: DC | PRN

## 2014-02-24 NOTE — Telephone Encounter (Signed)
done

## 2014-02-24 NOTE — Telephone Encounter (Signed)
Patient is requesting a script for lorazepam.

## 2014-03-02 ENCOUNTER — Encounter: Payer: Self-pay | Admitting: Internal Medicine

## 2014-03-02 ENCOUNTER — Ambulatory Visit: Payer: Self-pay

## 2014-03-23 ENCOUNTER — Ambulatory Visit: Payer: Self-pay | Admitting: Internal Medicine

## 2014-03-26 ENCOUNTER — Other Ambulatory Visit: Payer: Self-pay | Admitting: Internal Medicine

## 2014-03-30 ENCOUNTER — Other Ambulatory Visit: Payer: Self-pay | Admitting: Internal Medicine

## 2014-04-21 ENCOUNTER — Other Ambulatory Visit: Payer: Self-pay | Admitting: Internal Medicine

## 2014-04-24 ENCOUNTER — Other Ambulatory Visit (INDEPENDENT_AMBULATORY_CARE_PROVIDER_SITE_OTHER): Payer: Medicaid Other

## 2014-04-24 ENCOUNTER — Encounter: Payer: Self-pay | Admitting: Internal Medicine

## 2014-04-24 ENCOUNTER — Ambulatory Visit (INDEPENDENT_AMBULATORY_CARE_PROVIDER_SITE_OTHER): Payer: Medicaid Other | Admitting: Internal Medicine

## 2014-04-24 VITALS — BP 130/80 | HR 79 | Temp 97.6°F | Ht 71.0 in | Wt 184.0 lb

## 2014-04-24 DIAGNOSIS — E222 Syndrome of inappropriate secretion of antidiuretic hormone: Secondary | ICD-10-CM

## 2014-04-24 DIAGNOSIS — R311 Benign essential microscopic hematuria: Secondary | ICD-10-CM

## 2014-04-24 DIAGNOSIS — D539 Nutritional anemia, unspecified: Secondary | ICD-10-CM

## 2014-04-24 DIAGNOSIS — K279 Peptic ulcer, site unspecified, unspecified as acute or chronic, without hemorrhage or perforation: Secondary | ICD-10-CM

## 2014-04-24 DIAGNOSIS — I10 Essential (primary) hypertension: Secondary | ICD-10-CM

## 2014-04-24 DIAGNOSIS — F418 Other specified anxiety disorders: Secondary | ICD-10-CM

## 2014-04-24 DIAGNOSIS — Z86010 Personal history of colonic polyps: Secondary | ICD-10-CM

## 2014-04-24 DIAGNOSIS — M459 Ankylosing spondylitis of unspecified sites in spine: Secondary | ICD-10-CM

## 2014-04-24 DIAGNOSIS — M545 Low back pain, unspecified: Secondary | ICD-10-CM

## 2014-04-24 DIAGNOSIS — K921 Melena: Secondary | ICD-10-CM

## 2014-04-24 DIAGNOSIS — J0121 Acute recurrent ethmoidal sinusitis: Secondary | ICD-10-CM

## 2014-04-24 LAB — CBC WITH DIFFERENTIAL/PLATELET
Basophils Absolute: 0.1 10*3/uL (ref 0.0–0.1)
Basophils Relative: 0.5 % (ref 0.0–3.0)
EOS ABS: 0.1 10*3/uL (ref 0.0–0.7)
EOS PCT: 0.6 % (ref 0.0–5.0)
HCT: 41.1 % (ref 39.0–52.0)
HEMOGLOBIN: 13.8 g/dL (ref 13.0–17.0)
Lymphocytes Relative: 14.6 % (ref 12.0–46.0)
Lymphs Abs: 1.5 10*3/uL (ref 0.7–4.0)
MCHC: 33.5 g/dL (ref 30.0–36.0)
MCV: 87.2 fl (ref 78.0–100.0)
Monocytes Absolute: 1 10*3/uL (ref 0.1–1.0)
Monocytes Relative: 9.4 % (ref 3.0–12.0)
NEUTROS ABS: 7.9 10*3/uL — AB (ref 1.4–7.7)
Neutrophils Relative %: 74.9 % (ref 43.0–77.0)
Platelets: 432 10*3/uL — ABNORMAL HIGH (ref 150.0–400.0)
RBC: 4.71 Mil/uL (ref 4.22–5.81)
RDW: 13 % (ref 11.5–15.5)
WBC: 10.5 10*3/uL (ref 4.0–10.5)

## 2014-04-24 LAB — BASIC METABOLIC PANEL
BUN: 7 mg/dL (ref 6–23)
CO2: 30 mEq/L (ref 19–32)
CREATININE: 0.8 mg/dL (ref 0.4–1.5)
Calcium: 9.6 mg/dL (ref 8.4–10.5)
Chloride: 100 mEq/L (ref 96–112)
GFR: 105.5 mL/min (ref >60.00)
GLUCOSE: 103 mg/dL — AB (ref 70–99)
Potassium: 4.8 mEq/L (ref 3.5–5.1)
Sodium: 138 mEq/L (ref 135–145)

## 2014-04-24 MED ORDER — PANTOPRAZOLE SODIUM 40 MG PO TBEC
40.0000 mg | DELAYED_RELEASE_TABLET | Freq: Every day | ORAL | Status: AC
Start: 2014-04-24 — End: ?

## 2014-04-24 MED ORDER — LORAZEPAM 0.5 MG PO TABS: 0.5000 mg | ORAL_TABLET | Freq: Four times a day (QID) | ORAL | Status: DC | PRN

## 2014-04-24 MED ORDER — AMOXICILLIN-POT CLAVULANATE 875-125 MG PO TABS: 1.0000 | ORAL_TABLET | Freq: Two times a day (BID) | ORAL | Status: DC

## 2014-04-24 MED ORDER — HYDROCODONE-ACETAMINOPHEN 5-325 MG PO TABS: 1.0000 | ORAL_TABLET | Freq: Four times a day (QID) | ORAL | Status: DC | PRN

## 2014-04-24 NOTE — Progress Notes (Signed)
Subjective:    Patient ID: Anthony Lamb, male    DOB: April 25, 1956, 58 y.o.   MRN: [id number]  Sinusitis This is a recurrent problem. The current episode started 1 to 4 weeks ago. The problem has been gradually worsening since onset. There has been no fever. The fever has been present for less than 1 day. His pain is at a severity of 0/10. He is experiencing no pain. Associated symptoms include congestion and sinus pressure. Pertinent negatives include no chills, coughing, diaphoresis, ear pain, headaches, hoarse voice, neck pain, shortness of breath, sneezing, sore throat or swollen glands. Past treatments include saline sprays. The treatment provided mild relief.      Review of Systems  Constitutional: Negative.  Negative for fever, chills, diaphoresis, activity change, appetite change, fatigue and unexpected weight change.  HENT: Positive for congestion, postnasal drip, rhinorrhea (thick yellow/green phlegm) and sinus pressure. Negative for ear pain, facial swelling, hoarse voice, sneezing, sore throat, trouble swallowing and voice change.   Eyes: Negative.   Respiratory: Negative.  Negative for cough, choking, chest tightness, shortness of breath and stridor.   Cardiovascular: Negative.  Negative for chest pain, palpitations and leg swelling.  Gastrointestinal: Negative.  Negative for nausea, vomiting, abdominal pain, diarrhea and constipation.  Endocrine: Negative.   Genitourinary: Negative.   Musculoskeletal: Positive for back pain (he wants something stronger than tramadol for the pain). Negative for myalgias, joint swelling, arthralgias, gait problem, neck pain and neck stiffness.  Skin: Negative.  Negative for rash.  Allergic/Immunologic: Negative.   Neurological: Negative.  Negative for headaches.  Hematological: Negative.  Negative for adenopathy. Does not bruise/bleed easily.  Psychiatric/Behavioral: Negative for suicidal ideas, hallucinations, behavioral problems,  confusion, sleep disturbance, self-injury, dysphoric mood, decreased concentration and agitation. The patient is nervous/anxious. The patient is not hyperactive.        Objective:   Physical Exam  Constitutional: He is oriented to person, place, and time. He appears well-developed and well-nourished.  Non-toxic appearance. He does not have a sickly appearance. He does not appear ill. No distress.  HENT:  Head: Normocephalic and atraumatic.  Nose: Rhinorrhea present. No mucosal edema or sinus tenderness. No epistaxis.  No foreign bodies. Right sinus exhibits no maxillary sinus tenderness and no frontal sinus tenderness. Left sinus exhibits no maxillary sinus tenderness and no frontal sinus tenderness.  Mouth/Throat: Oropharynx is clear and moist and mucous membranes are normal. Mucous membranes are not pale, not dry and not cyanotic. No trismus in the jaw. No uvula swelling. No oropharyngeal exudate, posterior oropharyngeal edema, posterior oropharyngeal erythema or tonsillar abscesses.  Eyes: Conjunctivae are normal. Right eye exhibits no discharge. Left eye exhibits no discharge. No scleral icterus.  Neck: Normal range of motion. Neck supple. No JVD present. No tracheal deviation present. No thyromegaly present.  Cardiovascular: Normal rate, regular rhythm, normal heart sounds and intact distal pulses.  Exam reveals no gallop and no friction rub.   No murmur heard. Pulmonary/Chest: Effort normal and breath sounds normal. No stridor. No respiratory distress. He has no wheezes. He has no rales. He exhibits no tenderness.  Abdominal: Soft. Bowel sounds are normal. He exhibits no distension and no mass. There is no tenderness. There is no rebound and no guarding.  Musculoskeletal: Normal range of motion. He exhibits no edema or tenderness.       Lumbar back: Normal. He exhibits normal range of motion, no tenderness, no bony tenderness, no swelling, no edema, no deformity, no laceration, no pain, no  spasm  and normal pulse.  Lymphadenopathy:    He has no cervical adenopathy.  Neurological: He is alert and oriented to person, place, and time. He has normal strength. He displays no atrophy and no tremor. No cranial nerve deficit or sensory deficit. He exhibits normal muscle tone. He displays a negative Romberg sign. He displays no seizure activity. Coordination and gait normal.  Neg SLR in BLE  Skin: Skin is warm and dry. No rash noted. He is not diaphoretic. No erythema. No pallor.  Psychiatric: He has a normal mood and affect. His behavior is normal. Judgment and thought content normal.  Vitals reviewed.    Lab Results  Component Value Date   WBC 11.4* 12/17/2013   HGB 12.5* 12/17/2013   HCT 37.2* 12/17/2013   PLT 433.0* 12/17/2013   GLUCOSE 94 12/17/2013   CHOL 192 12/17/2013   TRIG 76.0 12/17/2013   HDL 51.30 12/17/2013   LDLCALC 126* 12/17/2013   ALT 13 12/17/2013   AST 16 12/17/2013   NA 134* 12/17/2013   K 4.6 12/17/2013   CL 96 12/17/2013   CREATININE 0.7 12/17/2013   BUN 6 12/17/2013   CO2 29 12/17/2013   TSH 0.99 12/17/2013   PSA 1.25 12/17/2013       Assessment & Plan:

## 2014-04-24 NOTE — Progress Notes (Signed)
Pre visit review using our clinic review tool, if applicable. No additional management support is needed unless otherwise documented below in the visit note. 

## 2014-04-24 NOTE — Assessment & Plan Note (Signed)
Will recheck his lytes today

## 2014-04-24 NOTE — Assessment & Plan Note (Signed)
Will refer to ENT at his request Will treat the infection with augmentin

## 2014-04-24 NOTE — Assessment & Plan Note (Signed)
Refer to pain management

## 2014-04-24 NOTE — Assessment & Plan Note (Signed)
GI referral

## 2014-04-24 NOTE — Assessment & Plan Note (Signed)
Will stop the tramadol at his request Will try norco for pain Refer to pain management

## 2014-04-24 NOTE — Patient Instructions (Signed)
Back Pain, Adult Low back pain is very common. About 1 in 5 people have back pain.The cause of low back pain is rarely dangerous. The pain often gets better over time.About half of people with a sudden onset of back pain feel better in just 2 weeks. About 8 in 10 people feel better by 6 weeks.  CAUSES Some common causes of back pain include:  Strain of the muscles or ligaments supporting the spine.  Wear and tear (degeneration) of the spinal discs.  Arthritis.  Direct injury to the back. DIAGNOSIS Most of the time, the direct cause of low back pain is not known.However, back pain can be treated effectively even when the exact cause of the pain is unknown.Answering your caregiver's questions about your overall health and symptoms is one of the most accurate ways to make sure the cause of your pain is not dangerous. If your caregiver needs more information, he or she may order lab work or imaging tests (X-rays or MRIs).However, even if imaging tests show changes in your back, this usually does not require surgery. HOME CARE INSTRUCTIONS For many people, back pain returns.Since low back pain is rarely dangerous, it is often a condition that people can learn to manageon their own.   Remain active. It is stressful on the back to sit or stand in one place. Do not sit, drive, or stand in one place for more than 30 minutes at a time. Take short walks on level surfaces as soon as pain allows.Try to increase the length of time you walk each day.  Do not stay in bed.Resting more than 1 or 2 days can delay your recovery.  Do not avoid exercise or work.Your body is made to move.It is not dangerous to be active, even though your back may hurt.Your back will likely heal faster if you return to being active before your pain is gone.  Pay attention to your body when you bend and lift. Many people have less discomfortwhen lifting if they bend their knees, keep the load close to their bodies,and  avoid twisting. Often, the most comfortable positions are those that put less stress on your recovering back.  Find a comfortable position to sleep. Use a firm mattress and lie on your side with your knees slightly bent. If you lie on your back, put a pillow under your knees.  Only take over-the-counter or prescription medicines as directed by your caregiver. Over-the-counter medicines to reduce pain and inflammation are often the most helpful.Your caregiver may prescribe muscle relaxant drugs.These medicines help dull your pain so you can more quickly return to your normal activities and healthy exercise.  Put ice on the injured area.  Put ice in a plastic bag.  Place a towel between your skin and the bag.  Leave the ice on for 15-20 minutes, 03-04 times a day for the first 2 to 3 days. After that, ice and heat may be alternated to reduce pain and spasms.  Ask your caregiver about trying back exercises and gentle massage. This may be of some benefit.  Avoid feeling anxious or stressed.Stress increases muscle tension and can worsen back pain.It is important to recognize when you are anxious or stressed and learn ways to manage it.Exercise is a great option. SEEK MEDICAL CARE IF:  You have pain that is not relieved with rest or medicine.  You have pain that does not improve in 1 week.  You have new symptoms.  You are generally not feeling well. SEEK   IMMEDIATE MEDICAL CARE IF:   You have pain that radiates from your back into your legs.  You develop new bowel or bladder control problems.  You have unusual weakness or numbness in your arms or legs.  You develop nausea or vomiting.  You develop abdominal pain.  You feel faint. Document Released: 05/22/2005 Document Revised: 11/21/2011 Document Reviewed: 09/23/2013 ExitCare Patient Information 2015 [employer]. This information is not intended to replace advice given to you by your health care provider. Make sure you  discuss any questions you have with your health care provider.  

## 2014-04-24 NOTE — Assessment & Plan Note (Signed)
I will recheck his CBC today

## 2014-05-08 ENCOUNTER — Ambulatory Visit: Payer: Medicaid Other

## 2014-05-13 ENCOUNTER — Ambulatory Visit (INDEPENDENT_AMBULATORY_CARE_PROVIDER_SITE_OTHER): Payer: Medicaid Other | Admitting: *Deleted

## 2014-05-13 ENCOUNTER — Ambulatory Visit: Payer: Medicaid Other

## 2014-05-13 DIAGNOSIS — Z23 Encounter for immunization: Secondary | ICD-10-CM

## 2014-05-15 ENCOUNTER — Telehealth: Payer: Self-pay | Admitting: Internal Medicine

## 2014-05-15 NOTE — Telephone Encounter (Signed)
Pt called in and wanted to know if Dr Ronnald Ramp could give him a Note excusing him from [employer].  He has to go in [place] but said he needs in within 10 days if he can?

## 2014-05-15 NOTE — Telephone Encounter (Signed)
MD out of office today. Will hold msg until he return on Monday 05/18/14...Anthony Lamb

## 2014-05-16 NOTE — Telephone Encounter (Signed)
yes

## 2014-05-18 NOTE — Telephone Encounter (Signed)
Called pt no answer LMOM RTC...Anthony Lamb

## 2014-05-19 NOTE — Telephone Encounter (Signed)
Called pt inform him will need juror # & date. Pt is wanting letter mail to him since he is not able to drive at this time. Mail letter...Anthony Lamb

## 2014-05-20 ENCOUNTER — Encounter: Payer: Self-pay | Admitting: Internal Medicine

## 2014-05-21 ENCOUNTER — Other Ambulatory Visit: Payer: Self-pay | Admitting: Internal Medicine

## 2014-06-04 ENCOUNTER — Other Ambulatory Visit: Payer: Self-pay | Admitting: Internal Medicine

## 2014-06-11 ENCOUNTER — Ambulatory Visit
Admission: RE | Admit: 2014-06-11 | Discharge: 2014-06-11 | Disposition: A | Discharge status: Home / Self Care | Payer: Medicaid Other | Source: Ambulatory Visit | Attending: Anesthesiology | Admitting: Anesthesiology

## 2014-06-11 ENCOUNTER — Other Ambulatory Visit: Payer: Self-pay | Admitting: Anesthesiology

## 2014-06-11 DIAGNOSIS — M545 Low back pain: Secondary | ICD-10-CM

## 2014-07-01 ENCOUNTER — Other Ambulatory Visit: Payer: Self-pay | Admitting: Internal Medicine

## 2014-07-14 ENCOUNTER — Ambulatory Visit: Payer: Medicaid Other | Admitting: Internal Medicine

## 2014-07-31 ENCOUNTER — Other Ambulatory Visit: Payer: Self-pay | Admitting: Pain Medicine

## 2014-07-31 DIAGNOSIS — M545 Low back pain: Secondary | ICD-10-CM

## 2014-08-06 ENCOUNTER — Encounter: Payer: Self-pay | Admitting: Internal Medicine

## 2014-08-06 ENCOUNTER — Ambulatory Visit (INDEPENDENT_AMBULATORY_CARE_PROVIDER_SITE_OTHER): Payer: Medicaid Other | Admitting: Internal Medicine

## 2014-08-06 ENCOUNTER — Ambulatory Visit (INDEPENDENT_AMBULATORY_CARE_PROVIDER_SITE_OTHER)
Admission: RE | Admit: 2014-08-06 | Discharge: 2014-08-06 | Disposition: A | Discharge status: Home / Self Care | Payer: Medicaid Other | Source: Ambulatory Visit | Attending: Internal Medicine | Admitting: Internal Medicine

## 2014-08-06 ENCOUNTER — Other Ambulatory Visit: Payer: Self-pay | Admitting: Internal Medicine

## 2014-08-06 ENCOUNTER — Other Ambulatory Visit (INDEPENDENT_AMBULATORY_CARE_PROVIDER_SITE_OTHER): Payer: Medicaid Other

## 2014-08-06 VITALS — BP 136/82 | HR 84 | Temp 98.3°F | Resp 16 | Ht 71.0 in | Wt 197.0 lb

## 2014-08-06 DIAGNOSIS — M545 Low back pain, unspecified: Secondary | ICD-10-CM

## 2014-08-06 DIAGNOSIS — M7989 Other specified soft tissue disorders: Secondary | ICD-10-CM

## 2014-08-06 DIAGNOSIS — J0121 Acute recurrent ethmoidal sinusitis: Secondary | ICD-10-CM

## 2014-08-06 DIAGNOSIS — E559 Vitamin D deficiency, unspecified: Secondary | ICD-10-CM

## 2014-08-06 DIAGNOSIS — M79671 Pain in right foot: Secondary | ICD-10-CM

## 2014-08-06 DIAGNOSIS — M8438XA Stress fracture, other site, initial encounter for fracture: Secondary | ICD-10-CM

## 2014-08-06 DIAGNOSIS — M79606 Pain in leg, unspecified: Secondary | ICD-10-CM | POA: Insufficient documentation

## 2014-08-06 DIAGNOSIS — E222 Syndrome of inappropriate secretion of antidiuretic hormone: Secondary | ICD-10-CM

## 2014-08-06 DIAGNOSIS — M8580 Other specified disorders of bone density and structure, unspecified site: Secondary | ICD-10-CM | POA: Insufficient documentation

## 2014-08-06 DIAGNOSIS — I10 Essential (primary) hypertension: Secondary | ICD-10-CM

## 2014-08-06 DIAGNOSIS — M79601 Pain in right arm: Secondary | ICD-10-CM

## 2014-08-06 DIAGNOSIS — M84376A Stress fracture, unspecified foot, initial encounter for fracture: Secondary | ICD-10-CM | POA: Insufficient documentation

## 2014-08-06 DIAGNOSIS — N400 Enlarged prostate without lower urinary tract symptoms: Secondary | ICD-10-CM

## 2014-08-06 DIAGNOSIS — M79604 Pain in right leg: Secondary | ICD-10-CM

## 2014-08-06 DIAGNOSIS — R311 Benign essential microscopic hematuria: Secondary | ICD-10-CM

## 2014-08-06 LAB — COMPREHENSIVE METABOLIC PANEL
ALBUMIN: 4.3 g/dL (ref 3.5–5.2)
ALT: 24 U/L (ref 0–53)
AST: 19 U/L (ref 0–37)
Alkaline Phosphatase: 91 U/L (ref 39–117)
BUN: 8 mg/dL (ref 6–23)
CALCIUM: 9.4 mg/dL (ref 8.4–10.5)
CHLORIDE: 96 meq/L (ref 96–112)
CO2: 30 meq/L (ref 19–32)
CREATININE: 0.87 mg/dL (ref 0.40–1.50)
GFR: 95.67 mL/min (ref >60.00)
GLUCOSE: 86 mg/dL (ref 70–99)
POTASSIUM: 4.3 meq/L (ref 3.5–5.1)
Sodium: 132 mEq/L — ABNORMAL LOW (ref 135–145)
Total Bilirubin: 0.5 mg/dL (ref 0.2–1.2)
Total Protein: 7.4 g/dL (ref 6.0–8.3)

## 2014-08-06 LAB — CBC WITH DIFFERENTIAL/PLATELET
Basophils Absolute: 0.1 10*3/uL (ref 0.0–0.1)
Basophils Relative: 0.5 % (ref 0.0–3.0)
EOS PCT: 1.1 % (ref 0.0–5.0)
Eosinophils Absolute: 0.1 10*3/uL (ref 0.0–0.7)
HCT: 40.1 % (ref 39.0–52.0)
Hemoglobin: 13.8 g/dL (ref 13.0–17.0)
LYMPHS PCT: 16.2 % (ref 12.0–46.0)
Lymphs Abs: 1.9 10*3/uL (ref 0.7–4.0)
MCHC: 34.3 g/dL (ref 30.0–36.0)
MCV: 86.5 fl (ref 78.0–100.0)
MONOS PCT: 7.9 % (ref 3.0–12.0)
Monocytes Absolute: 0.9 10*3/uL (ref 0.1–1.0)
NEUTROS ABS: 8.9 10*3/uL — AB (ref 1.4–7.7)
Neutrophils Relative %: 74.3 % (ref 43.0–77.0)
Platelets: 360 10*3/uL (ref 150.0–400.0)
RBC: 4.64 Mil/uL (ref 4.22–5.81)
RDW: 13.1 % (ref 11.5–15.5)
WBC: 11.9 10*3/uL — ABNORMAL HIGH (ref 4.0–10.5)

## 2014-08-06 LAB — URINALYSIS, ROUTINE W REFLEX MICROSCOPIC
BILIRUBIN URINE: NEGATIVE
Ketones, ur: NEGATIVE
Leukocytes, UA: NEGATIVE
Nitrite: NEGATIVE
Specific Gravity, Urine: 1.01 (ref 1.000–1.030)
Total Protein, Urine: NEGATIVE
URINE GLUCOSE: NEGATIVE
Urobilinogen, UA: 0.2 (ref 0.0–1.0)
pH: 7 (ref 5.0–8.0)

## 2014-08-06 LAB — SEDIMENTATION RATE: Sed Rate: 29 mm/hr — ABNORMAL HIGH (ref 0–22)

## 2014-08-06 LAB — TSH: TSH: 1.1 u[IU]/mL (ref 0.35–4.50)

## 2014-08-06 LAB — PSA: PSA: 0.74 ng/mL (ref 0.10–4.00)

## 2014-08-06 MED ORDER — AMOXICILLIN-POT CLAVULANATE 875-125 MG PO TABS: 1.0000 | ORAL_TABLET | Freq: Two times a day (BID) | ORAL | Status: DC

## 2014-08-06 MED ORDER — TRAMADOL HCL 50 MG PO TABS: 50.0000 mg | ORAL_TABLET | Freq: Three times a day (TID) | ORAL | Status: DC | PRN

## 2014-08-06 NOTE — Progress Notes (Signed)
Pre visit review using our clinic review tool, if applicable. No additional management support is needed unless otherwise documented below in the visit note. 

## 2014-08-06 NOTE — Progress Notes (Signed)
Subjective:    Patient ID: Anthony Lamb, male    DOB: 1956/01/13, 59 y.o.   MRN: [id number]  HPI Comments: He complains of a vague pain over his right foot and right lower leg for 2 months, he does not recall any trauma or injury, he wants something for pain.  Sinusitis This is a recurrent problem. The current episode started 1 to 4 weeks ago. The problem has been gradually worsening since onset. There has been no fever. The fever has been present for less than 1 day. His pain is at a severity of 0/10. He is experiencing no pain. Associated symptoms include sinus pressure. Pertinent negatives include no chills, congestion, coughing, diaphoresis, ear pain, headaches, hoarse voice, neck pain, shortness of breath, sneezing, sore throat or swollen glands. Past treatments include nothing. The treatment provided no relief.      Review of Systems  Constitutional: Negative.  Negative for fever, chills, diaphoresis and fatigue.  HENT: Positive for postnasal drip, rhinorrhea (thick, green nasal phlegm) and sinus pressure. Negative for congestion, ear pain, hoarse voice, sneezing and sore throat.   Eyes: Negative.   Respiratory: Negative.  Negative for cough, choking, chest tightness, shortness of breath and stridor.   Cardiovascular: Positive for leg swelling. Negative for chest pain and palpitations.  Gastrointestinal: Negative.  Negative for nausea, vomiting, abdominal pain, diarrhea and constipation.  Endocrine: Negative.   Genitourinary: Negative.   Musculoskeletal: Positive for back pain and arthralgias. Negative for neck pain.  Skin: Negative.   Allergic/Immunologic: Negative.   Neurological: Negative.  Negative for dizziness and headaches.  Hematological: Negative.  Negative for adenopathy. Does not bruise/bleed easily.  Psychiatric/Behavioral: Positive for sleep disturbance. Negative for suicidal ideas, hallucinations, behavioral problems, confusion, self-injury, dysphoric mood,  decreased concentration and agitation. The patient is nervous/anxious. The patient is not hyperactive.        Objective:   Physical Exam  Constitutional: He is oriented to person, place, and time. He appears well-developed and well-nourished.  Non-toxic appearance. He does not have a sickly appearance. He does not appear ill. No distress.  HENT:  Head: Normocephalic and atraumatic.  Nose: Rhinorrhea present. No mucosal edema or sinus tenderness. Right sinus exhibits maxillary sinus tenderness. Right sinus exhibits no frontal sinus tenderness. Left sinus exhibits maxillary sinus tenderness. Left sinus exhibits no frontal sinus tenderness.  Mouth/Throat: Oropharynx is clear and moist and mucous membranes are normal. Mucous membranes are not pale, not dry and not cyanotic. No oral lesions. No trismus in the jaw. No uvula swelling. No oropharyngeal exudate, posterior oropharyngeal edema, posterior oropharyngeal erythema or tonsillar abscesses.  Eyes: Conjunctivae are normal. Right eye exhibits no discharge. Left eye exhibits no discharge. No scleral icterus.  Neck: Normal range of motion. Neck supple. No JVD present. No tracheal deviation present. No thyromegaly present.  Cardiovascular: Normal rate, regular rhythm, normal heart sounds and intact distal pulses.  Exam reveals no gallop and no friction rub.   No murmur heard. Pulmonary/Chest: Effort normal and breath sounds normal. No stridor. No respiratory distress. He has no wheezes. He has no rales. He exhibits no tenderness.  Abdominal: Soft. Bowel sounds are normal. He exhibits no distension and no mass. There is no tenderness. There is no rebound and no guarding.  Musculoskeletal: Normal range of motion. He exhibits edema (2+ pitting edema RLE, tr edema LLE). He exhibits no tenderness.       Right lower leg: He exhibits bony tenderness, swelling and edema. He exhibits no deformity and no  laceration.       Legs:      Right foot: There is bony  tenderness. There is normal range of motion, no swelling, normal capillary refill, no crepitus, no deformity and no laceration.       Feet:  Lymphadenopathy:    He has no cervical adenopathy.  Neurological: He is oriented to person, place, and time.  Skin: Skin is warm and dry. No rash noted. He is not diaphoretic. No erythema. No pallor.  Vitals reviewed.    Lab Results  Component Value Date   WBC 10.5 04/24/2014   HGB 13.8 04/24/2014   HCT 41.1 04/24/2014   PLT 432.0* 04/24/2014   GLUCOSE 103* 04/24/2014   CHOL 192 12/17/2013   TRIG 76.0 12/17/2013   HDL 51.30 12/17/2013   LDLCALC 126* 12/17/2013   ALT 13 12/17/2013   AST 16 12/17/2013   NA 138 04/24/2014   K 4.8 04/24/2014   CL 100 04/24/2014   CREATININE 0.8 04/24/2014   BUN 7 04/24/2014   CO2 30 04/24/2014   TSH 0.99 12/17/2013   PSA 1.25 12/17/2013       Assessment & Plan:

## 2014-08-06 NOTE — Patient Instructions (Signed)
Back Pain, Adult Low back pain is very common. About 1 in 5 people have back pain.The cause of low back pain is rarely dangerous. The pain often gets better over time.About half of people with a sudden onset of back pain feel better in just 2 weeks. About 8 in 10 people feel better by 6 weeks.  CAUSES Some common causes of back pain include:  Strain of the muscles or ligaments supporting the spine.  Wear and tear (degeneration) of the spinal discs.  Arthritis.  Direct injury to the back. DIAGNOSIS Most of the time, the direct cause of low back pain is not known.However, back pain can be treated effectively even when the exact cause of the pain is unknown.Answering your caregiver's questions about your overall health and symptoms is one of the most accurate ways to make sure the cause of your pain is not dangerous. If your caregiver needs more information, he or she may order lab work or imaging tests (X-rays or MRIs).However, even if imaging tests show changes in your back, this usually does not require surgery. HOME CARE INSTRUCTIONS For many people, back pain returns.Since low back pain is rarely dangerous, it is often a condition that people can learn to manageon their own.   Remain active. It is stressful on the back to sit or stand in one place. Do not sit, drive, or stand in one place for more than 30 minutes at a time. Take short walks on level surfaces as soon as pain allows.Try to increase the length of time you walk each day.  Do not stay in bed.Resting more than 1 or 2 days can delay your recovery.  Do not avoid exercise or work.Your body is made to move.It is not dangerous to be active, even though your back may hurt.Your back will likely heal faster if you return to being active before your pain is gone.  Pay attention to your body when you bend and lift. Many people have less discomfortwhen lifting if they bend their knees, keep the load close to their bodies,and  avoid twisting. Often, the most comfortable positions are those that put less stress on your recovering back.  Find a comfortable position to sleep. Use a firm mattress and lie on your side with your knees slightly bent. If you lie on your back, put a pillow under your knees.  Only take over-the-counter or prescription medicines as directed by your caregiver. Over-the-counter medicines to reduce pain and inflammation are often the most helpful.Your caregiver may prescribe muscle relaxant drugs.These medicines help dull your pain so you can more quickly return to your normal activities and healthy exercise.  Put ice on the injured area.  Put ice in a plastic bag.  Place a towel between your skin and the bag.  Leave the ice on for 15-20 minutes, 03-04 times a day for the first 2 to 3 days. After that, ice and heat may be alternated to reduce pain and spasms.  Ask your caregiver about trying back exercises and gentle massage. This may be of some benefit.  Avoid feeling anxious or stressed.Stress increases muscle tension and can worsen back pain.It is important to recognize when you are anxious or stressed and learn ways to manage it.Exercise is a great option. SEEK MEDICAL CARE IF:  You have pain that is not relieved with rest or medicine.  You have pain that does not improve in 1 week.  You have new symptoms.  You are generally not feeling well. SEEK   IMMEDIATE MEDICAL CARE IF:   You have pain that radiates from your back into your legs.  You develop new bowel or bladder control problems.  You have unusual weakness or numbness in your arms or legs.  You develop nausea or vomiting.  You develop abdominal pain.  You feel faint. Document Released: 05/22/2005 Document Revised: 11/21/2011 Document Reviewed: 09/23/2013 ExitCare Patient Information 2015 [employer]. This information is not intended to replace advice given to you by your health care provider. Make sure you  discuss any questions you have with your health care provider.  

## 2014-08-07 ENCOUNTER — Telehealth: Payer: Self-pay | Admitting: Internal Medicine

## 2014-08-07 LAB — D-DIMER, QUANTITATIVE: D-Dimer, Quant: 0.45 ug/mL-FEU (ref 0.00–0.48)

## 2014-08-07 NOTE — Assessment & Plan Note (Signed)
Will treat the infection with augementin

## 2014-08-07 NOTE — Telephone Encounter (Signed)
Pt stated that we told him that we going to send in LORazepam (ATIVAN) 0.5 MG tablet to [facility] but [facility] do not have it. Please call pt once this is done to him know.

## 2014-08-07 NOTE — Assessment & Plan Note (Signed)
There is assym pitting edema but his d-dimer is WNL so I am not concerned about a DVT The most likely causes for this are the stress fracture over right RLE and the CCB therapy Will treat the stress fracture and will stop the CCB

## 2014-08-07 NOTE — Assessment & Plan Note (Signed)
I will check his VIt D level and have asked him to have a BMD done

## 2014-08-07 NOTE — Telephone Encounter (Signed)
Pt informed rx has been faxed.

## 2014-08-07 NOTE — Assessment & Plan Note (Signed)
Will refer to sports medicine for further evaluation

## 2014-08-14 ENCOUNTER — Telehealth: Payer: Self-pay | Admitting: Internal Medicine

## 2014-08-18 ENCOUNTER — Other Ambulatory Visit: Payer: Medicaid Other

## 2014-10-14 ENCOUNTER — Encounter: Payer: Self-pay | Admitting: Family

## 2014-10-14 ENCOUNTER — Ambulatory Visit (INDEPENDENT_AMBULATORY_CARE_PROVIDER_SITE_OTHER): Payer: Self-pay | Admitting: Family

## 2014-10-14 VITALS — BP 150/94 | HR 82 | Temp 98.2°F | Resp 18 | Ht 71.0 in | Wt 205.1 lb

## 2014-10-14 DIAGNOSIS — M459 Ankylosing spondylitis of unspecified sites in spine: Secondary | ICD-10-CM

## 2014-10-14 DIAGNOSIS — F418 Other specified anxiety disorders: Secondary | ICD-10-CM

## 2014-10-14 MED ORDER — TRAMADOL HCL 50 MG PO TABS: 50.0000 mg | ORAL_TABLET | Freq: Three times a day (TID) | ORAL | Status: DC | PRN

## 2014-10-14 MED ORDER — LORAZEPAM 1 MG PO TABS: 1.0000 mg | ORAL_TABLET | Freq: Three times a day (TID) | ORAL | Status: DC

## 2014-10-14 MED ORDER — FLUTICASONE PROPIONATE 50 MCG/ACT NA SUSP: 2.0000 | Freq: Every day | NASAL | Status: DC

## 2014-10-14 NOTE — Progress Notes (Signed)
Pre visit review using our clinic review tool, if applicable. No additional management support is needed unless otherwise documented below in the visit note. 

## 2014-10-14 NOTE — Progress Notes (Signed)
Subjective:    Patient ID: Anthony Lamb, male    DOB: Jun 07, 1955, 59 y.o.   MRN: [id number]  Chief Complaint  Patient presents with  . Medication Refill    refill of tramadol and lorazepam, wants to see if he could get flonase as well for sinus    HPI:  Anthony Lamb is a 59 y.o. male PMH of hypertension, peptic ulcer disease, irritable bowel syndrome, dementia, ankylosing spondylitis, BPH, and osteopenia who presents today for an office follow up.  1.) Anxiety / Sleep - Indicates that his anxiety and sleep are labile with the lorazepam. He is able to sleep about 6 hours of interrupted sleep with the medication. Notes that there is some room for improvement of symptoms and has been taking up to 2 tablets to achieve the desired results. Denies any adverse effects of the medication.  2.) Ankylosing spondylitis - Pain management is improved with the Tramadol. Severity of the pain is 7/10 with the medication and 10/10 without the medication. Does express some difficulties performing housework or yard work secondary to the pain. The tramadol does make him slightly more functional. Takes his medication as prescribed and denies any adverse effects of medication. Denies constipation.    Allergies  Allergen Reactions  . Diphenhydramine Hcl     REACTION: restlessness  . Flexeril [Cyclobenzaprine]     restlessness  . Nsaids Other (See Comments)    GI upset  . Verapamil Other (See Comments)    edema    Current Outpatient Prescriptions on File Prior to Visit  Medication Sig Dispense Refill  . amoxicillin-clavulanate (AUGMENTIN) 875-125 MG per tablet Take 1 tablet by mouth 2 (two) times daily. 20 tablet 1  . Cholecalciferol 50000 UNITS TABS Take 1 tablet by mouth once a week. 12 tablet 3  . hydrOXYzine (ATARAX/VISTARIL) 50 MG tablet TAKE 2 TABLETS THREE TIMES DAILY. MUST SEE NEW DOCTOR FOR FUTURE REFILLS. 180 tablet 2  . LORazepam (ATIVAN) 0.5 MG tablet TAKE 1 TABLET BY MOUTH EVERY  6 HOURS AS NEEDED FOR ANXIETY 65 tablet 2  . pantoprazole (PROTONIX) 40 MG tablet Take 1 tablet (40 mg total) by mouth daily. 30 tablet 11  . sucralfate (CARAFATE) 1 G tablet Take 1 tablet (1 g total) by mouth 4 (four) times daily -  with meals and at bedtime. Chew before swallowing 120 tablet 5  . traMADol (ULTRAM) 50 MG tablet Take 1 tablet (50 mg total) by mouth every 8 (eight) hours as needed. 75 tablet 1   No current facility-administered medications on file prior to visit.    Review of Systems  Gastrointestinal: Negative for constipation.  Musculoskeletal: Positive for back pain.  Psychiatric/Behavioral: The patient is nervous/anxious.       Objective:    BP 150/94 mmHg  Pulse 82  Temp(Src) 98.2 F (36.8 C) (Oral)  Resp 18  Ht 5' 11"  (1.803 m)  Wt 205 lb 1.9 oz (93.042 kg)  BMI 28.62 kg/m2  SpO2 96% Nursing note and vital signs reviewed.  Physical Exam  Constitutional: He is oriented to person, place, and time. He appears well-developed and well-nourished. No distress.  Cardiovascular: Normal rate, regular rhythm, normal heart sounds and intact distal pulses.   Pulmonary/Chest: Effort normal and breath sounds normal.  Musculoskeletal:  Increased kyphotic curve noted. No edema or discoloration of back noted. Palpable tenderness midline thoracic and lumbar spine. Range of motion is decreased secondary to pain.  Neurological: He is alert and oriented to person, place,  and time.  Skin: Skin is warm and dry.  Psychiatric: He has a normal mood and affect. His behavior is normal. Judgment and thought content normal.       Assessment & Plan:

## 2014-10-14 NOTE — Assessment & Plan Note (Signed)
Pain is decreased and functionality is improved with current regimen of tramadol every 8 hours as needed. Continue current dosage of tramadol.

## 2014-10-14 NOTE — Assessment & Plan Note (Signed)
Continues to experience anxiety with 0.5 mg of lorazepam every  8 hours. Increase lorazepam to 1 mg every 8 hours as needed for anxiety. Follow-up with PCP in one month.

## 2014-10-14 NOTE — Patient Instructions (Signed)
Thank you for choosing [facility].  Summary/Instructions:  Your prescription(s) have been submitted to your pharmacy or been printed and provided for you. Please take as directed and contact our office if you believe you are having problem(s) with the medication(s) or have any questions.  Please follow up with Dr. Ronnald Ramp in 1 month.

## 2014-10-27 ENCOUNTER — Other Ambulatory Visit: Payer: Self-pay | Admitting: Internal Medicine

## 2014-11-11 ENCOUNTER — Encounter: Payer: Self-pay | Admitting: Internal Medicine

## 2014-11-11 ENCOUNTER — Ambulatory Visit (INDEPENDENT_AMBULATORY_CARE_PROVIDER_SITE_OTHER): Payer: Self-pay | Admitting: Internal Medicine

## 2014-11-11 VITALS — BP 148/86 | HR 86 | Temp 98.0°F | Resp 16 | Ht 71.0 in | Wt 205.0 lb

## 2014-11-11 DIAGNOSIS — M459 Ankylosing spondylitis of unspecified sites in spine: Secondary | ICD-10-CM

## 2014-11-11 DIAGNOSIS — F418 Other specified anxiety disorders: Secondary | ICD-10-CM

## 2014-11-11 DIAGNOSIS — I10 Essential (primary) hypertension: Secondary | ICD-10-CM

## 2014-11-11 MED ORDER — LORAZEPAM 1 MG PO TABS: 1.0000 mg | ORAL_TABLET | Freq: Three times a day (TID) | ORAL | Status: DC

## 2014-11-11 MED ORDER — TRAMADOL HCL 50 MG PO TABS: 50.0000 mg | ORAL_TABLET | Freq: Three times a day (TID) | ORAL | Status: DC | PRN

## 2014-11-11 NOTE — Patient Instructions (Addendum)
Back Pain, Adult Low back pain is very common. About 1 in 5 people have back pain.The cause of low back pain is rarely dangerous. The pain often gets better over time.About half of people with a sudden onset of back pain feel better in just 2 weeks. About 8 in 10 people feel better by 6 weeks.  CAUSES Some common causes of back pain include:  Strain of the muscles or ligaments supporting the spine.  Wear and tear (degeneration) of the spinal discs.  Arthritis.  Direct injury to the back. DIAGNOSIS Most of the time, the direct cause of low back pain is not known.However, back pain can be treated effectively even when the exact cause of the pain is unknown.Answering your caregiver's questions about your overall health and symptoms is one of the most accurate ways to make sure the cause of your pain is not dangerous. If your caregiver needs more information, he or she may order lab work or imaging tests (X-rays or MRIs).However, even if imaging tests show changes in your back, this usually does not require surgery. HOME CARE INSTRUCTIONS For many people, back pain returns.Since low back pain is rarely dangerous, it is often a condition that people can learn to [facility] their own.   Remain active. It is stressful on the back to sit or stand in one place. Do not sit, drive, or stand in one place for more than 30 minutes at a time. Take short walks on level surfaces as soon as pain allows.Try to increase the length of time you walk each day.  Do not stay in bed.Resting more than 1 or 2 days can delay your recovery.  Do not avoid exercise or work.Your body is made to move.It is not dangerous to be active, even though your back may hurt.Your back will likely heal faster if you return to being active before your pain is gone.  Pay attention to your body when you bend and lift. Many people have less discomfortwhen lifting if they bend their knees, keep the load close to their bodies,and  avoid twisting. Often, the most comfortable positions are those that put less stress on your recovering back.  Find a comfortable position to sleep. Use a firm mattress and lie on your side with your knees slightly bent. If you lie on your back, put a pillow under your knees.  Only take over-the-counter or prescription medicines as directed by your caregiver. Over-the-counter medicines to reduce pain and inflammation are often the most helpful.Your caregiver may prescribe muscle relaxant drugs.These medicines help dull your pain so you can more quickly return to your normal activities and healthy exercise.  Put ice on the injured area.  Put ice in a plastic bag.  Place a towel between your skin and the bag.  Leave the ice on for 15-20 minutes, 03-04 times a day for the first 2 to 3 days. After that, ice and heat may be alternated to reduce pain and spasms.  Ask your caregiver about trying back exercises and gentle massage. This may be of some benefit.  Avoid feeling anxious or stressed.Stress increases muscle tension and can worsen back pain.It is important to recognize when you are anxious or stressed and learn ways to manage it.Exercise is a great option. SEEK MEDICAL CARE IF:  You have pain that is not relieved with rest or medicine.  You have pain that does not improve in 1 week.  You have new symptoms.  You are generally not feeling well. SEEK  IMMEDIATE MEDICAL CARE IF:   You have pain that radiates from your back into your legs.  You develop new bowel or bladder control problems.  You have unusual weakness or numbness in your arms or legs.  You develop nausea or vomiting.  You develop abdominal pain.  You feel faint. Document Released: 05/22/2005 Document Revised: 11/21/2011 Document Reviewed: 09/23/2013 [facility] Patient Information 2015 [place], [place]. This information is not intended to replace advice given to you by your health care provider. Make sure you  discuss any questions you have with your health care provider.  Please make an appointment with the provider on your "[place]"  Medicaid card within the next month.

## 2014-11-11 NOTE — Progress Notes (Signed)
Pre visit review using our clinic review tool, if applicable. No additional management support is needed unless otherwise documented below in the visit note. 

## 2014-11-11 NOTE — Progress Notes (Signed)
Subjective:  Patient ID: Anthony Lamb, male    DOB: 09/17/1955  Age: 59 y.o. MRN: [id number]  CC: Back Pain  I was informed today that his insurance is with [facility] and that his assigned provider is with [facility]. He arrived before I was made aware of this so I was asked by practice manager Lebron Conners) to see him and give him enough meds to cover him until he can see his assigned provider.  HPI Anthony Lamb presents for low back pain that has been present for many years, he describes it as an intermittent aching sensation that does not radiate. He takes tramadol and gets relief from the pain. He is not willing to go to pain management any more. He also complains of episodes or anxiety and panic.  Outpatient Prescriptions Prior to Visit  Medication Sig Dispense Refill  . Cholecalciferol 50000 UNITS TABS Take 1 tablet by mouth once a week. 12 tablet 3  . fluticasone (FLONASE) 50 MCG/ACT nasal spray Place 2 sprays into both nostrils daily. 16 g 6  . hydrOXYzine (ATARAX/VISTARIL) 50 MG tablet TAKE 2 TABLETS THREE TIMES DAILY. MUST SEE NEW DOCTOR FOR FUTURE REFILLS. 180 tablet 2  . pantoprazole (PROTONIX) 40 MG tablet Take 1 tablet (40 mg total) by mouth daily. 30 tablet 11  . sucralfate (CARAFATE) 1 G tablet Take 1 tablet (1 g total) by mouth 4 (four) times daily -  with meals and at bedtime. Chew before swallowing 120 tablet 5  . LORazepam (ATIVAN) 1 MG tablet Take 1 tablet (1 mg total) by mouth every 8 (eight) hours. 90 tablet 0  . traMADol (ULTRAM) 50 MG tablet Take 1 tablet (50 mg total) by mouth every 8 (eight) hours as needed. 75 tablet 0  . amoxicillin-clavulanate (AUGMENTIN) 875-125 MG per tablet TAKE 1 TABLET BY MOUTH TWICE DAILY 20 tablet 0   No facility-administered medications prior to visit.    ROS Review of Systems  Constitutional: Negative.  Negative for fever, chills, diaphoresis, appetite change and fatigue.  HENT: Negative.   Eyes:  Negative.   Respiratory: Negative.  Negative for cough, choking, chest tightness, shortness of breath and stridor.   Cardiovascular: Negative.  Negative for chest pain, palpitations and leg swelling.  Gastrointestinal: Negative.  Negative for nausea, vomiting, abdominal pain, constipation and blood in stool.  Endocrine: Negative.   Genitourinary: Negative.   Musculoskeletal: Positive for back pain. Negative for myalgias, joint swelling, arthralgias, gait problem, neck pain and neck stiffness.  Skin: Negative.   Allergic/Immunologic: Negative.   Neurological: Negative.  Negative for dizziness, tremors, syncope, weakness and light-headedness.  Hematological: Negative.  Negative for adenopathy. Does not bruise/bleed easily.  Psychiatric/Behavioral: Positive for decreased concentration. Negative for suicidal ideas, hallucinations, behavioral problems, confusion, sleep disturbance, self-injury, dysphoric mood and agitation. The patient is nervous/anxious. The patient is not hyperactive.     Objective:  BP 148/86 mmHg  Pulse 86  Temp(Src) 98 F (36.7 C) (Oral)  Resp 16  Ht 5' 11"  (1.803 m)  Wt 205 lb (92.987 kg)  BMI 28.60 kg/m2  SpO2 96%  BP Readings from Last 3 Encounters:  11/11/14 148/86  10/14/14 150/94  08/06/14 136/82    Wt Readings from Last 3 Encounters:  11/11/14 205 lb (92.987 kg)  10/14/14 205 lb 1.9 oz (93.042 kg)  08/06/14 197 lb (89.359 kg)    Physical Exam  Constitutional: He appears well-developed and well-nourished. No distress.  HENT:  Head: Normocephalic and atraumatic.  Mouth/Throat: Oropharynx is clear and moist. No oropharyngeal exudate.  Eyes: Conjunctivae are normal. Right eye exhibits no discharge. Left eye exhibits no discharge. No scleral icterus.  Neck: Normal range of motion. Neck supple. No JVD present. No tracheal deviation present. No thyromegaly present.  Cardiovascular: Normal rate, regular rhythm, normal heart sounds and intact distal pulses.   Exam reveals no gallop and no friction rub.   No murmur heard. Pulmonary/Chest: Effort normal and breath sounds normal. No stridor. No respiratory distress. He has no wheezes. He has no rales. He exhibits no tenderness.  Abdominal: Soft. Bowel sounds are normal. He exhibits no distension and no mass. There is no tenderness. There is no rebound and no guarding.  Musculoskeletal: Normal range of motion. He exhibits no edema or tenderness.       Lumbar back: Normal. He exhibits normal range of motion, no tenderness, no bony tenderness, no swelling, no edema, no deformity, no laceration, no pain, no spasm and normal pulse.  Lymphadenopathy:    He has no cervical adenopathy.  Neurological: He is alert. He has normal strength. He displays no atrophy, no tremor and normal reflexes. No cranial nerve deficit or sensory deficit. He exhibits normal muscle tone. He displays a negative Romberg sign. He displays no seizure activity. Coordination and gait normal.  Reflex Scores:      Tricep reflexes are 0 on the right side and 0 on the left side.      Bicep reflexes are 0 on the right side and 0 on the left side.      Brachioradialis reflexes are 0 on the right side and 0 on the left side.      Patellar reflexes are 0 on the right side and 0 on the left side.      Achilles reflexes are 0 on the right side and 0 on the left side. Neg SLR in BLE  Skin: Skin is warm and dry. No rash noted. He is not diaphoretic. No erythema. No pallor.  Psychiatric: He has a normal mood and affect. His behavior is normal. Judgment and thought content normal.  Vitals reviewed.   Lab Results  Component Value Date   WBC 11.9* 08/06/2014   HGB 13.8 08/06/2014   HCT 40.1 08/06/2014   PLT 360.0 08/06/2014   GLUCOSE 86 08/06/2014   CHOL 192 12/17/2013   TRIG 76.0 12/17/2013   HDL 51.30 12/17/2013   LDLCALC 126* 12/17/2013   ALT 24 08/06/2014   AST 19 08/06/2014   NA 132* 08/06/2014   K 4.3 08/06/2014   CL 96 08/06/2014    CREATININE 0.87 08/06/2014   BUN 8 08/06/2014   CO2 30 08/06/2014   TSH 1.10 08/06/2014   PSA 0.74 08/06/2014    Dg Tibia/fibula Right  08/06/2014   CLINICAL DATA:  Right leg and foot swelling for 2 months.  EXAM: RIGHT TIBIA AND FIBULA - 2 VIEW  COMPARISON:  None.  FINDINGS: No acute fracture involving the tibia or fibula.  There is a band of sclerosis through the mid calcaneus, extending from the central bone to the dorsal cortex, worrisome for a stress fracture.  No dislocation.  Mild osteopenia.  Unremarkable soft tissues.  IMPRESSION: Findings are worrisome for a calcaneus stress fracture in the calcaneus. MRI is recommended.   Electronically Signed   By: Marybelle Killings M.D.   On: 08/06/2014 15:16   Dg Foot Complete Right  08/06/2014   ADDENDUM REPORT: 08/06/2014 15:27  ADDENDUM: There is subtle sclerotic  line in mid calcaneus. Subacute stress fracture cannot be excluded. Clinical correlation is necessary. Further correlation with MRI could be performed as clinically warranted.   Electronically Signed   By: Lahoma Crocker M.D.   On: 08/06/2014 15:27   08/06/2014   CLINICAL DATA:  Right leg pain and swelling  EXAM: RIGHT FOOT COMPLETE - 3+ VIEW  COMPARISON:  None  FINDINGS: Three views of the right foot submitted. There is diffuse osteopenia. Tiny plantar spur of calcaneus. No acute fracture or subluxation. Degenerative changes are noted first metatarsal phalangeal joint and interphalangeal joint great toe. Mild degenerative changes distal interphalangeal joint second and third toe.  IMPRESSION: No acute fracture or subluxation. Small plantar spur of calcaneus. Diffuse osteopenia. Mild degenerative changes as described above.  Electronically Signed: By: Lahoma Crocker M.D. On: 08/06/2014 14:54    Assessment & Plan:   Amaree was seen today for back pain.  Diagnoses and all orders for this visit:  Essential hypertension - his BP is well controlled, he agrees to see his [facility] provider within the next  month  Ankylosing spondylitis Orders: -     traMADol (ULTRAM) 50 MG tablet; Take 1 tablet (50 mg total) by mouth every 8 (eight) hours as needed.  Depression with anxiety Orders: -     LORazepam (ATIVAN) 1 MG tablet; Take 1 tablet (1 mg total) by mouth every 8 (eight) hours.  I have discontinued Mr. [employer] amoxicillin-clavulanate. I am also having him maintain his sucralfate, Cholecalciferol, pantoprazole, hydrOXYzine, fluticasone, verapamil, LORazepam, and traMADol.  Meds ordered this encounter  Medications  . verapamil (CALAN-SR) 240 MG CR tablet    Sig: TK 1 T PO QHS    Refill:  3  . LORazepam (ATIVAN) 1 MG tablet    Sig: Take 1 tablet (1 mg total) by mouth every 8 (eight) hours.    Dispense:  60 tablet    Refill:  0  . traMADol (ULTRAM) 50 MG tablet    Sig: Take 1 tablet (50 mg total) by mouth every 8 (eight) hours as needed.    Dispense:  75 tablet    Refill:  0     Follow-up: Return if symptoms worsen or fail to improve.  Scarlette Calico, MD

## 2014-11-19 ENCOUNTER — Other Ambulatory Visit: Payer: Self-pay | Admitting: Internal Medicine

## 2014-12-10 ENCOUNTER — Other Ambulatory Visit: Payer: Self-pay | Admitting: Internal Medicine

## 2014-12-23 ENCOUNTER — Encounter (HOSPITAL_COMMUNITY): Payer: Self-pay | Admitting: Emergency Medicine

## 2014-12-23 ENCOUNTER — Emergency Department (HOSPITAL_COMMUNITY)
Admission: EM | Admit: 2014-12-23 | Discharge: 2014-12-23 | Disposition: A | Discharge status: Home / Self Care | Payer: Medicaid Other | Attending: Emergency Medicine | Admitting: Emergency Medicine

## 2014-12-23 DIAGNOSIS — K2970 Gastritis, unspecified, without bleeding: Secondary | ICD-10-CM | POA: Insufficient documentation

## 2014-12-23 DIAGNOSIS — R109 Unspecified abdominal pain: Secondary | ICD-10-CM | POA: Diagnosis present

## 2014-12-23 DIAGNOSIS — F419 Anxiety disorder, unspecified: Secondary | ICD-10-CM | POA: Diagnosis not present

## 2014-12-23 DIAGNOSIS — K219 Gastro-esophageal reflux disease without esophagitis: Secondary | ICD-10-CM | POA: Insufficient documentation

## 2014-12-23 DIAGNOSIS — Z862 Personal history of diseases of the blood and blood-forming organs and certain disorders involving the immune mechanism: Secondary | ICD-10-CM | POA: Diagnosis not present

## 2014-12-23 DIAGNOSIS — M459 Ankylosing spondylitis of unspecified sites in spine: Secondary | ICD-10-CM

## 2014-12-23 DIAGNOSIS — G8929 Other chronic pain: Secondary | ICD-10-CM

## 2014-12-23 DIAGNOSIS — I10 Essential (primary) hypertension: Secondary | ICD-10-CM | POA: Insufficient documentation

## 2014-12-23 DIAGNOSIS — F418 Other specified anxiety disorders: Secondary | ICD-10-CM

## 2014-12-23 DIAGNOSIS — Z87891 Personal history of nicotine dependence: Secondary | ICD-10-CM | POA: Diagnosis not present

## 2014-12-23 DIAGNOSIS — Z7951 Long term (current) use of inhaled steroids: Secondary | ICD-10-CM | POA: Diagnosis not present

## 2014-12-23 DIAGNOSIS — F329 Major depressive disorder, single episode, unspecified: Secondary | ICD-10-CM | POA: Diagnosis not present

## 2014-12-23 DIAGNOSIS — Z760 Encounter for issue of repeat prescription: Secondary | ICD-10-CM | POA: Insufficient documentation

## 2014-12-23 DIAGNOSIS — Z79899 Other long term (current) drug therapy: Secondary | ICD-10-CM | POA: Insufficient documentation

## 2014-12-23 MED ORDER — TRAMADOL HCL 50 MG PO TABS
50.0000 mg | ORAL_TABLET | Freq: Once | ORAL | Status: AC
  Administered 2014-12-23: 50 mg via ORAL
  Filled 2014-12-23: qty 1

## 2014-12-23 MED ORDER — LORAZEPAM 1 MG PO TABS: 1.0000 mg | ORAL_TABLET | Freq: Three times a day (TID) | ORAL | Status: DC

## 2014-12-23 MED ORDER — SODIUM CHLORIDE 0.9 % IV SOLN: INTRAVENOUS | Status: DC

## 2014-12-23 MED ORDER — SODIUM CHLORIDE 0.9 % IV BOLUS (SEPSIS): 2000.0000 mL | Freq: Once | INTRAVENOUS | Status: DC

## 2014-12-23 MED ORDER — LORAZEPAM 1 MG PO TABS
1.0000 mg | ORAL_TABLET | Freq: Once | ORAL | Status: AC
  Administered 2014-12-23: 1 mg via ORAL
  Filled 2014-12-23: qty 1

## 2014-12-23 MED ORDER — TRAMADOL HCL 50 MG PO TABS: 50.0000 mg | ORAL_TABLET | Freq: Three times a day (TID) | ORAL | Status: DC | PRN

## 2014-12-23 NOTE — Discharge Instructions (Signed)

## 2014-12-23 NOTE — ED Provider Notes (Signed)
CSN: [id number]     Arrival date & time 12/23/14  1444 History   First MD Initiated Contact with Patient 12/23/14 1533     Chief Complaint  Patient presents with  . Abdominal Pain  . Medication Refill     (Consider location/radiation/quality/duration/timing/severity/associated sxs/prior Treatment) HPI Comments: Patient here complaining of chronic back abdominal cramping with anxiety. He has been out of his medications for the past several weeks. Takes Ultram and Ativan. Denies any fever, vomiting, black or bloody stools. Had sat some insomnia due to lack of his medications. Has contacted his old physician and trying to arrange new care. His current symptoms are chronic and have been unchanged and similar to his prior. Denies any urinary symptoms. Nothing makes his symptoms better. Has been using Tylenol when necessary  Patient is a 59 y.o. male presenting with abdominal pain. The history is provided by the patient.  Abdominal Pain   Past Medical History  Diagnosis Date  . Hypertension   . GERD (gastroesophageal reflux disease)   . Depression   . Anxiety   . Chronic pain   . Gastritis   . Colitis   . IBS (irritable bowel syndrome)   . Poor dentition   . Alcohol abuse, in remission   . Esophagitis   . Anemia    Past Surgical History  Procedure Laterality Date  . Hernia repair Bilateral   . Tonsillectomy    . Colonoscopy w/ biopsies    . Esophagogastroduodenoscopy     Family History  Problem Relation Age of Onset  . Anxiety disorder Mother   . Congestive Heart Failure Mother   . Crohn's disease Mother   . Arthritis Father   . High blood pressure Father    History  Substance Use Topics  . Smoking status: Former Smoker    Types: Cigarettes  . Smokeless tobacco: Never Used  . Alcohol Use: No    Review of Systems  Gastrointestinal: Positive for abdominal pain.  All other systems reviewed and are negative.     Allergies  Diphenhydramine hcl; Flexeril; and  Nsaids  Home Medications   Prior to Admission medications   Medication Sig Start Date End Date Taking? Authorizing Provider  fluticasone (FLONASE) 50 MCG/ACT nasal spray Place 2 sprays into both nostrils daily. 10/14/14   [place] Circle, FNP  hydrOXYzine (ATARAX/VISTARIL) 50 MG tablet TAKE 2 TABLETS BY MOUTH THREE TIMES DAILY. MUST SEE NEW DOCTOR FOR FUTURE REFILLS. 11/19/14   Janith Lima, MD  LORazepam (ATIVAN) 1 MG tablet Take 1 tablet (1 mg total) by mouth every 8 (eight) hours. 11/11/14   Janith Lima, MD  pantoprazole (PROTONIX) 40 MG tablet Take 1 tablet (40 mg total) by mouth daily. 04/24/14   Janith Lima, MD  sucralfate (CARAFATE) 1 G tablet Take 1 tablet (1 g total) by mouth 4 (four) times daily -  with meals and at bedtime. Chew before swallowing 12/17/13   Janith Lima, MD  traMADol (ULTRAM) 50 MG tablet Take 1 tablet (50 mg total) by mouth every 8 (eight) hours as needed. 11/11/14   Janith Lima, MD  verapamil (CALAN-SR) 240 MG CR tablet TK 1 T PO QHS 08/25/14   Historical Provider, MD  Vitamin D, Ergocalciferol, (DRISDOL) 50000 UNITS CAPS capsule TAKE 1 CAPSULE BY MOUTH ONCE A WEEK 11/19/14   Janith Lima, MD   BP 177/85 mmHg  Pulse 96  Temp(Src) 98.5 F (36.9 C) (Oral)  Resp 16  SpO2 97% Physical  Exam  Constitutional: He is oriented to person, place, and time. He appears well-developed and well-nourished.  Non-toxic appearance. No distress.  HENT:  Head: Normocephalic and atraumatic.  Eyes: Conjunctivae, EOM and lids are normal. Pupils are equal, round, and reactive to light.  Neck: Normal range of motion. Neck supple. No tracheal deviation present. No thyroid mass present.  Cardiovascular: Normal rate, regular rhythm and normal heart sounds.  Exam reveals no gallop.   No murmur heard. Pulmonary/Chest: Effort normal and breath sounds normal. No stridor. No respiratory distress. He has no decreased breath sounds. He has no wheezes. He has no rhonchi. He has no rales.   Abdominal: Soft. Normal appearance and bowel sounds are normal. He exhibits no distension. There is no tenderness. There is no rebound and no CVA tenderness.  Musculoskeletal: Normal range of motion. He exhibits no edema or tenderness.  Neurological: He is alert and oriented to person, place, and time. He has normal strength. No cranial nerve deficit or sensory deficit. GCS eye subscore is 4. GCS verbal subscore is 5. GCS motor subscore is 6.  Skin: Skin is warm and dry. No abrasion and no rash noted.  Psychiatric: He has a normal mood and affect. His speech is normal and behavior is normal.  Nursing note and vitals reviewed.   ED Course  Procedures (including critical care time) Labs Review Labs Reviewed - No data to display  Imaging Review No results found.   EKG Interpretation None      MDM   Final diagnoses:  None    Patient given Ativan and Ultram here for his chronic pain and I will refill his prescriptions for 1 week and have encouraged him to seek care    Lacretia Leigh, MD 12/23/14 1557

## 2014-12-23 NOTE — ED Notes (Addendum)
Pt c/o back pain and abdominal pain, pt unable to get his current pain medications, anxiolytics, and medications for his ulcerative colitis and IBS, so unable to eat. Pt states his stool has been dark green, pt does take iron supplement every day.

## 2015-08-13 ENCOUNTER — Encounter (HOSPITAL_COMMUNITY): Payer: Self-pay

## 2015-08-13 ENCOUNTER — Emergency Department (HOSPITAL_COMMUNITY)
Admission: EM | Admit: 2015-08-13 | Discharge: 2015-08-13 | Disposition: A | Discharge status: Home / Self Care | Payer: Medicaid Other | Attending: Emergency Medicine | Admitting: Emergency Medicine

## 2015-08-13 DIAGNOSIS — Z79899 Other long term (current) drug therapy: Secondary | ICD-10-CM | POA: Diagnosis not present

## 2015-08-13 DIAGNOSIS — Z87891 Personal history of nicotine dependence: Secondary | ICD-10-CM | POA: Diagnosis not present

## 2015-08-13 DIAGNOSIS — K219 Gastro-esophageal reflux disease without esophagitis: Secondary | ICD-10-CM | POA: Diagnosis not present

## 2015-08-13 DIAGNOSIS — I10 Essential (primary) hypertension: Secondary | ICD-10-CM | POA: Insufficient documentation

## 2015-08-13 DIAGNOSIS — G8929 Other chronic pain: Secondary | ICD-10-CM | POA: Diagnosis not present

## 2015-08-13 DIAGNOSIS — Z8711 Personal history of peptic ulcer disease: Secondary | ICD-10-CM | POA: Diagnosis not present

## 2015-08-13 DIAGNOSIS — Z862 Personal history of diseases of the blood and blood-forming organs and certain disorders involving the immune mechanism: Secondary | ICD-10-CM | POA: Insufficient documentation

## 2015-08-13 DIAGNOSIS — F329 Major depressive disorder, single episode, unspecified: Secondary | ICD-10-CM | POA: Insufficient documentation

## 2015-08-13 DIAGNOSIS — K029 Dental caries, unspecified: Secondary | ICD-10-CM

## 2015-08-13 DIAGNOSIS — F419 Anxiety disorder, unspecified: Secondary | ICD-10-CM | POA: Diagnosis not present

## 2015-08-13 DIAGNOSIS — K0889 Other specified disorders of teeth and supporting structures: Secondary | ICD-10-CM | POA: Diagnosis present

## 2015-08-13 MED ORDER — AMOXICILLIN-POT CLAVULANATE 875-125 MG PO TABS
1.0000 | ORAL_TABLET | Freq: Once | ORAL | Status: AC
  Administered 2015-08-13: 1 via ORAL
  Filled 2015-08-13: qty 1

## 2015-08-13 MED ORDER — AMOXICILLIN-POT CLAVULANATE 875-125 MG PO TABS: 1.0000 | ORAL_TABLET | Freq: Two times a day (BID) | ORAL | Status: DC

## 2015-08-13 MED ORDER — HYDROCODONE-ACETAMINOPHEN 5-325 MG PO TABS
1.0000 | ORAL_TABLET | Freq: Once | ORAL | Status: AC
  Administered 2015-08-13: 1 via ORAL
  Filled 2015-08-13: qty 1

## 2015-08-13 MED ORDER — KETOROLAC TROMETHAMINE 30 MG/ML IJ SOLN
30.0000 mg | Freq: Once | INTRAMUSCULAR | Status: AC
  Administered 2015-08-13: 30 mg via INTRAMUSCULAR
  Filled 2015-08-13: qty 1

## 2015-08-13 NOTE — Discharge Instructions (Signed)
Please call one of the clinics below to see a dentist ASAP. In the meantime I will give you a prescription for antibiotics. You may take the hydrocodone you have at home as needed for pain. Return to the ER for new or worsening symptoms.  [facility] [employer] [address] is a great source of information about community services available.  Access by dialing 2-1-1 from anywhere in [place], or by website -  CustodianSupply.fi.   Other Local Resources (Updated 06/2015)  Dental  Care   Services    Phone Number and Address  Cost  [facility] For children 101 - 72 years of age:   Cleaning  Tooth brushing/flossing instruction  Sealants, fillings, crowns  Extractions  Emergency treatment  [phone] [address], [place] [address] Charges based on family income.  Medicaid and some insurance plans accepted.     [place] [facility] - [facility], fillings, crowns  Extractions  Emergency treatment [phone] [address], [place]  Pregnant women 24 years of age or older with a Medicaid card  [place] [facility] - [place]  Cleaning  Sealants, fillings, crowns  Extractions  Emergency treatment [phone] [address] [place], [place] Pregnant women 83 years of age or older with a Medicaid card  [facility] For children 82 - 63 years of age:   Cleaning  Tooth brushing/flossing instruction  Sealants, fillings, crowns  Extractions  Emergency treatment Limited orthodontic services for patients with Medicaid [phone] [address], [place] [address] Medicaid and [facility] Health Choice cover for children up to age 64 and pregnant women.  Parents of children up to age 80 without Medicaid pay a reduced fee at time of service.  [place] For children 55 - 2 years of age:   Cleaning  Tooth brushing/flossing instruction  Sealants, fillings, crowns  Extractions  Emergency treatment Limited orthodontic services for patients with Medicaid [phone] [place], [place].  Medicaid and [place] Health Choice cover for children up to age 61 and pregnant women.  Parents of children up to age 35 without Medicaid pay a reduced fee.  [facility]  Sealants, fillings, crowns  Extractions  Hours: Tuesdays and Thursdays, 4:15 - 8 pm [phone] [address] [place], [place] [address] Services free of charge to [facility] residents ages 18-64 who do not have health insurance, Medicare, [place], or [place] benefits and fall within federal poverty guidelines  [place] care in addition to primary medical care, nutritional counseling, and pharmacy:  Engineer, drilling, fillings, crowns  Extractions                  [phone] [facility], [place], [place], [place], [place], [place] [facility], [place], [place] [facility] [place], [place], [place], most insurance.  Also provides services available to all with fees adjusted based on ability to pay.    [facility]  Cleaning  Tooth brushing/flossing instruction  Sealants, fillings,  crowns  Extractions  Emergency treatment Hours: Tuesdays, Thursdays, and Fridays from 8 am to 5 pm by appointment only. [phone] [address], [place] [address] [facility] residents with Medicaid (depending on  eligibility) and children with [facility] Health Choice - call for more information.  [facility]  Extractions only  Hours: 2nd and 4th Thursday of each month from 6:30 am - 9 am.   [phone] ext. [place], [place] [address] Ages 26 and older only.  Patients are seen on a first come, first served basis.  [employer]  [employer]  Extractions  Orthodontics  Endodontics  Implants/Crowns/Bridges  Complete and partial dentures [phone] [place], [place] Patients must complete an application for services.  There is often a waiting list.

## 2015-08-13 NOTE — Progress Notes (Signed)
Entered in d/c instructions  Janith Lima Schedule an appointment as soon as possible for a visit This is your assigned Medicaid [place] access doctor If you prefer another contact DSS [phone] DSS assigned your doctor *You may receive a bill if you go to any family Dr not assigned to you [address] [address] [phone] Medicaid [place] Access Covered Patient [place]: [phone], [place] [address] [email or web address] Use this website to assist with understanding your coverage & to renew application As a Medicaid client you MUST contact DSS/SSI each time you change address, move to another [place] or another state to keep your address updated  [place] Medicaid Transportation to Dr appts if you are have full Medicaid: [phone], [phone]

## 2015-08-13 NOTE — ED Notes (Addendum)
Patient reports that he has multiple broken teeth and cavities. Patient states he began having gum and facial swelling x 2 months and has been getting progressively worse. Patient states he had chills yesterday.

## 2015-08-13 NOTE — ED Provider Notes (Signed)
CSN: [id number]     Arrival date & time 08/13/15  1404 History   First MD Initiated Contact with Patient 08/13/15 1952     Chief Complaint  Patient presents with  . Dental Pain  . Facial Swelling   HPI  Anthony Lamb is an 60 y.o. male with history of GERD, PUD, HTN, IBS, EtOH abuse, chronic pain who presents to the ED for evaluation of dental pain and facial swelling. He states he has had a longstanding history of broken teeth and cavities, though it has gotten worse over the past two months. He states he can't sleep due to the pain. States his face has gradually become more swollen as well, particularly on the left side. He states he has been unable to afford dental care. States he has been taking hydrocodone that he has for chronic back pain which has provided minimal relief. Denies trismus, dysphagia, drooling. Endorses subjective fever.   Past Medical History  Diagnosis Date  . Hypertension   . GERD (gastroesophageal reflux disease)   . Depression   . Anxiety   . Chronic pain   . Gastritis   . Colitis   . IBS (irritable bowel syndrome)   . Poor dentition   . Alcohol abuse, in remission   . Esophagitis   . Anemia    Past Surgical History  Procedure Laterality Date  . Hernia repair Bilateral   . Tonsillectomy    . Colonoscopy w/ biopsies    . Esophagogastroduodenoscopy     Family History  Problem Relation Age of Onset  . Anxiety disorder Mother   . Congestive Heart Failure Mother   . Crohn's disease Mother   . Arthritis Father   . High blood pressure Father    Social History  Substance Use Topics  . Smoking status: Former Smoker    Types: Cigarettes  . Smokeless tobacco: Never Used  . Alcohol Use: No    Review of Systems  All other systems reviewed and are negative.     Allergies  Diphenhydramine hcl; Flexeril; and Nsaids  Home Medications   Prior to Admission medications   Medication Sig Start Date End Date Taking? Authorizing Provider  fluticasone  (FLONASE) 50 MCG/ACT nasal spray Place 2 sprays into both nostrils daily. Patient taking differently: Place 2 sprays into both nostrils daily as needed for allergies.  10/14/14  Yes Golden Circle, FNP  HYDROcodone-acetaminophen (NORCO/VICODIN) 5-325 MG tablet TK 1 T PO TID PRN FOR PAIN 07/20/15  Yes Historical Provider, MD  hydrOXYzine (ATARAX/VISTARIL) 25 MG tablet TK 1 T PO TID PRN FOR ANXIETY AND ITCHING 08/06/15  Yes Historical Provider, MD  lisinopril-hydrochlorothiazide (PRINZIDE,ZESTORETIC) 10-12.5 MG tablet TK 1 T PO D 07/25/15  Yes Historical Provider, MD  LORazepam (ATIVAN) 0.5 MG tablet TK 1 T PO Every 8 HRS 07/20/15  Yes Historical Provider, MD  pantoprazole (PROTONIX) 40 MG tablet Take 1 tablet (40 mg total) by mouth daily. 04/24/14  Yes Janith Lima, MD  sucralfate (CARAFATE) 1 G tablet Take 1 tablet (1 g total) by mouth 4 (four) times daily -  with meals and at bedtime. Chew before swallowing 12/17/13  Yes Janith Lima, MD  verapamil (CALAN-SR) 240 MG CR tablet TK 1 T PO QHS 08/25/14  Yes Historical Provider, MD  Vitamin D, Ergocalciferol, (DRISDOL) 50000 UNITS CAPS capsule TAKE 1 CAPSULE BY MOUTH ONCE A WEEK 11/19/14  Yes Janith Lima, MD  hydrOXYzine (ATARAX/VISTARIL) 50 MG tablet TAKE 2 TABLETS BY MOUTH THREE  TIMES DAILY. MUST SEE NEW DOCTOR FOR FUTURE REFILLS. Patient not taking: Reported on 08/13/2015 11/19/14   Janith Lima, MD  LORazepam (ATIVAN) 1 MG tablet Take 1 tablet (1 mg total) by mouth every 8 (eight) hours. Patient not taking: Reported on 08/13/2015 12/23/14   Lacretia Leigh, MD  traMADol (ULTRAM) 50 MG tablet Take 1 tablet (50 mg total) by mouth every 8 (eight) hours as needed. Patient not taking: Reported on 08/13/2015 12/23/14   Lacretia Leigh, MD   BP 171/93 mmHg  Pulse 109  Temp(Src) 97.9 F (36.6 C) (Oral)  Resp 18  SpO2 96% Physical Exam  Constitutional: He is oriented to person, place, and time. No distress.  HENT:  Head: Atraumatic.  Right Ear: External  ear normal.  Left Ear: External ear normal.  Nose: Nose normal.  Left cheek with mild maxillary edema and erythema. No facial tenderness. No periorbital erythema or edema.   Extremely poor dentition. Many broken, grossly decaying teeth. Gingiva is diffusely inflamed. No gross abscesses.  Uvula midline. No trismus.   No submental or submandibular swelling  Eyes: Conjunctivae are normal. No scleral icterus.  Neck: Normal range of motion. Neck supple.  Cardiovascular: Normal rate and regular rhythm.   Pulmonary/Chest: Effort normal. No respiratory distress. He exhibits no tenderness.  Abdominal: Soft. He exhibits no distension. There is no tenderness.  Neurological: He is alert and oriented to person, place, and time.  Skin: Skin is warm and dry. He is not diaphoretic.  Psychiatric: He has a normal mood and affect. His behavior is normal.  Nursing note and vitals reviewed.  Filed Vitals:   08/13/15 1415 08/13/15 2037  BP: 171/93 163/86  Pulse: 109 96  Temp: 97.9 F (36.6 C) 99.5 F (37.5 C)  TempSrc: Oral Oral  Resp: 18 20  SpO2: 96% 94%     ED Course  Procedures (including critical care time) Labs Review Labs Reviewed - No data to display  Imaging Review No results found. I have personally reviewed and evaluated these images and lab results as part of my medical decision-making.   EKG Interpretation None      MDM   Final diagnoses:  Pain due to dental caries    Pt with grossly extremely poor dentition. He does have some facial edema. However, no gross abscess to drain in the ED. No trismus, angioedema. Uvula is midline and there is no e/o PTA. He is tolerating secretions. Pt is afebrile. Initial tachycardia has improved with dose of home pain meds. Rx given for augmentin with first dose given in the ED. Pt has vicodin at home and cannot tolerate NSAIDs. Instructed him to take tylenol for pain, or home vicodin if needed. Dental resource guide given as ultimately pt  will need to see dentist/OMFS as I suspect he needs full mouth extractions. ER return precautions given.     Anne Ng, PA-C 08/14/15 0036  Charlesetta Shanks, MD 08/18/15 [phone]

## 2015-11-10 ENCOUNTER — Emergency Department (HOSPITAL_COMMUNITY)
Admission: EM | Admit: 2015-11-10 | Discharge: 2015-11-10 | Disposition: A | Discharge status: Home / Self Care | Payer: Medicaid Other | Attending: Emergency Medicine | Admitting: Emergency Medicine

## 2015-11-10 ENCOUNTER — Encounter (HOSPITAL_COMMUNITY): Payer: Self-pay

## 2015-11-10 DIAGNOSIS — J069 Acute upper respiratory infection, unspecified: Secondary | ICD-10-CM | POA: Diagnosis not present

## 2015-11-10 DIAGNOSIS — F329 Major depressive disorder, single episode, unspecified: Secondary | ICD-10-CM | POA: Insufficient documentation

## 2015-11-10 DIAGNOSIS — Z87891 Personal history of nicotine dependence: Secondary | ICD-10-CM | POA: Insufficient documentation

## 2015-11-10 DIAGNOSIS — K279 Peptic ulcer, site unspecified, unspecified as acute or chronic, without hemorrhage or perforation: Secondary | ICD-10-CM | POA: Insufficient documentation

## 2015-11-10 DIAGNOSIS — Z79899 Other long term (current) drug therapy: Secondary | ICD-10-CM | POA: Diagnosis not present

## 2015-11-10 DIAGNOSIS — I10 Essential (primary) hypertension: Secondary | ICD-10-CM | POA: Insufficient documentation

## 2015-11-10 DIAGNOSIS — Z792 Long term (current) use of antibiotics: Secondary | ICD-10-CM | POA: Diagnosis not present

## 2015-11-10 DIAGNOSIS — R109 Unspecified abdominal pain: Secondary | ICD-10-CM | POA: Diagnosis present

## 2015-11-10 DIAGNOSIS — K029 Dental caries, unspecified: Secondary | ICD-10-CM | POA: Diagnosis not present

## 2015-11-10 LAB — CBC
HCT: 40.6 % (ref 39.0–52.0)
HEMOGLOBIN: 14.2 g/dL (ref 13.0–17.0)
MCH: 29.6 pg (ref 26.0–34.0)
MCHC: 35 g/dL (ref 30.0–36.0)
MCV: 84.6 fL (ref 78.0–100.0)
PLATELETS: 347 10*3/uL (ref 150–400)
RBC: 4.8 MIL/uL (ref 4.22–5.81)
RDW: 12.9 % (ref 11.5–15.5)
WBC: 8.4 10*3/uL (ref 4.0–10.5)

## 2015-11-10 LAB — COMPREHENSIVE METABOLIC PANEL
ALBUMIN: 4.5 g/dL (ref 3.5–5.0)
ALK PHOS: 81 U/L (ref 38–126)
ALT: 62 U/L (ref 17–63)
ANION GAP: 10 (ref 5–15)
AST: 66 U/L — ABNORMAL HIGH (ref 15–41)
BILIRUBIN TOTAL: 1.1 mg/dL (ref 0.3–1.2)
BUN: 7 mg/dL (ref 6–20)
CALCIUM: 9.3 mg/dL (ref 8.9–10.3)
CO2: 26 mmol/L (ref 22–32)
Chloride: 90 mmol/L — ABNORMAL LOW (ref 101–111)
Creatinine, Ser: 0.85 mg/dL (ref 0.61–1.24)
GFR calc Af Amer: >60 mL/min (ref >60)
GFR calc non Af Amer: >60 mL/min (ref >60)
GLUCOSE: 123 mg/dL — AB (ref 65–99)
Potassium: 4 mmol/L (ref 3.5–5.1)
SODIUM: 126 mmol/L — AB (ref 135–145)
TOTAL PROTEIN: 8.2 g/dL — AB (ref 6.5–8.1)

## 2015-11-10 LAB — URINALYSIS, ROUTINE W REFLEX MICROSCOPIC
BILIRUBIN URINE: NEGATIVE
Glucose, UA: NEGATIVE mg/dL
KETONES UR: NEGATIVE mg/dL
Leukocytes, UA: NEGATIVE
NITRITE: NEGATIVE
PROTEIN: NEGATIVE mg/dL
SPECIFIC GRAVITY, URINE: 1.02 (ref 1.005–1.030)
pH: 7 (ref 5.0–8.0)

## 2015-11-10 LAB — URINE MICROSCOPIC-ADD ON

## 2015-11-10 LAB — LIPASE, BLOOD: Lipase: 24 U/L (ref 11–51)

## 2015-11-10 MED ORDER — LORAZEPAM 1 MG PO TABS
1.0000 mg | ORAL_TABLET | ORAL | Status: DC | PRN
  Administered 2015-11-10: 1 mg via ORAL
  Filled 2015-11-10: qty 1

## 2015-11-10 MED ORDER — HYDROXYZINE HCL 25 MG PO TABS
25.0000 mg | ORAL_TABLET | Freq: Once | ORAL | Status: AC
  Administered 2015-11-10: 25 mg via ORAL
  Filled 2015-11-10: qty 1

## 2015-11-10 NOTE — ED Provider Notes (Signed)
CSN: [id number]     Arrival date & time 11/10/15  1604 History   First MD Initiated Contact with Patient 11/10/15 1704     Chief Complaint  Patient presents with  . Abdominal Pain  . Nausea      HPI  Patient presents for evaluation of multiple complaints. She is signs congestion. She stated that it hurts to eat. He is out of lorazepam because he is taken to maybe they will refill until Saturday. States she takes ulcers. Triage note states he is out of his Protonix he states he has "enough to get me by until Saturday".  He denies any new or acute complaints. States he is not vomiting or having abdominal pain with eating. He states his teeth are bad hurts to eat.   Past Medical History  Diagnosis Date  . Hypertension   . GERD (gastroesophageal reflux disease)   . Depression   . Anxiety   . Chronic pain   . Gastritis   . Colitis   . IBS (irritable bowel syndrome)   . Poor dentition   . Alcohol abuse, in remission   . Esophagitis   . Anemia    Past Surgical History  Procedure Laterality Date  . Hernia repair Bilateral   . Tonsillectomy    . Colonoscopy w/ biopsies    . Esophagogastroduodenoscopy     Family History  Problem Relation Age of Onset  . Anxiety disorder Mother   . Congestive Heart Failure Mother   . Crohn's disease Mother   . Arthritis Father   . High blood pressure Father    Social History  Substance Use Topics  . Smoking status: Former Smoker    Types: Cigarettes  . Smokeless tobacco: Never Used  . Alcohol Use: No    Review of Systems  Constitutional: Negative for fever, chills, diaphoresis, appetite change and fatigue.  HENT: Positive for congestion, dental problem and sinus pressure. Negative for mouth sores, sore throat and trouble swallowing.   Eyes: Negative for visual disturbance.  Respiratory: Negative for cough, chest tightness, shortness of breath and wheezing.   Cardiovascular: Negative for chest pain.  Gastrointestinal: Negative for  nausea, vomiting, abdominal pain, diarrhea and abdominal distention.  Endocrine: Negative for polydipsia, polyphagia and polyuria.  Genitourinary: Negative for dysuria, frequency and hematuria.  Musculoskeletal: Negative for gait problem.  Skin: Negative for color change, pallor and rash.  Neurological: Negative for dizziness, syncope, light-headedness and headaches.  Hematological: Does not bruise/bleed easily.  Psychiatric/Behavioral: Negative for behavioral problems and confusion.      Allergies  Diphenhydramine hcl; Flexeril; and Nsaids  Home Medications   Prior to Admission medications   Medication Sig Start Date End Date Taking? Authorizing Provider  amoxicillin-clavulanate (AUGMENTIN) 875-125 MG tablet Take 1 tablet by mouth every 12 (twelve) hours. 08/13/15   Olivia Canter Sam, PA-C  fluticasone (FLONASE) 50 MCG/ACT nasal spray Place 2 sprays into both nostrils daily. Patient taking differently: Place 2 sprays into both nostrils daily as needed for allergies.  10/14/14   Golden Circle, FNP  HYDROcodone-acetaminophen (NORCO/VICODIN) 5-325 MG tablet TK 1 T PO TID PRN FOR PAIN 07/20/15   Historical Provider, MD  hydrOXYzine (ATARAX/VISTARIL) 25 MG tablet TK 1 T PO TID PRN FOR ANXIETY AND ITCHING 08/06/15   Historical Provider, MD  hydrOXYzine (ATARAX/VISTARIL) 50 MG tablet TAKE 2 TABLETS BY MOUTH THREE TIMES DAILY. MUST SEE NEW DOCTOR FOR FUTURE REFILLS. Patient not taking: Reported on 08/13/2015 11/19/14   Janith Lima, MD  lisinopril-hydrochlorothiazide (PRINZIDE,ZESTORETIC) 10-12.5 MG tablet TK 1 T PO D 07/25/15   Historical Provider, MD  LORazepam (ATIVAN) 0.5 MG tablet TK 1 T PO Every 8 HRS 07/20/15   Historical Provider, MD  LORazepam (ATIVAN) 1 MG tablet Take 1 tablet (1 mg total) by mouth every 8 (eight) hours. Patient not taking: Reported on 08/13/2015 12/23/14   Lacretia Leigh, MD  pantoprazole (PROTONIX) 40 MG tablet Take 1 tablet (40 mg total) by mouth daily. 04/24/14   Janith Lima, MD  sucralfate (CARAFATE) 1 G tablet Take 1 tablet (1 g total) by mouth 4 (four) times daily -  with meals and at bedtime. Chew before swallowing 12/17/13   Janith Lima, MD  traMADol (ULTRAM) 50 MG tablet Take 1 tablet (50 mg total) by mouth every 8 (eight) hours as needed. Patient not taking: Reported on 08/13/2015 12/23/14   Lacretia Leigh, MD  verapamil (CALAN-SR) 240 MG CR tablet TK 1 T PO QHS 08/25/14   Historical Provider, MD  Vitamin D, Ergocalciferol, (DRISDOL) 50000 UNITS CAPS capsule TAKE 1 CAPSULE BY MOUTH ONCE A WEEK 11/19/14   Janith Lima, MD   BP 149/99 mmHg  Pulse 100  Temp(Src) 98.8 F (37.1 C) (Oral)  Resp 20  SpO2 97% Physical Exam  Constitutional: He is oriented to person, place, and time. He appears well-developed and well-nourished. No distress.  HENT:  Head: Normocephalic.  Poor dentition without obvious abscess or infection.  No scleral icterus. Conjunctiva not pale. Moist mucous membranes.  Eyes: Conjunctivae are normal. Pupils are equal, round, and reactive to light. No scleral icterus.  Neck: Normal range of motion. Neck supple. No thyromegaly present.  Cardiovascular: Normal rate and regular rhythm.  Exam reveals no gallop and no friction rub.   No murmur heard. Pulmonary/Chest: Effort normal and breath sounds normal. No respiratory distress. He has no wheezes. He has no rales.  Abdominal: Soft. Bowel sounds are normal. He exhibits no distension. There is no tenderness. There is no rebound.  Musculoskeletal: Normal range of motion.  Neurological: He is alert and oriented to person, place, and time.  Skin: Skin is warm and dry. No rash noted.  Psychiatric: He has a normal mood and affect. His behavior is normal.    ED Course  Procedures (including critical care time) Labs Review Labs Reviewed  CBC  LIPASE, BLOOD  COMPREHENSIVE METABOLIC PANEL  URINALYSIS, ROUTINE W REFLEX MICROSCOPIC (NOT AT [facility])    Imaging Review No results found. I have  personally reviewed and evaluated these images and lab results as part of my medical decision-making.   EKG Interpretation None      MDM   Final diagnoses:  URI (upper respiratory infection)  Dental caries  Peptic ulcer    Non emergent complaints and normal exam.  F/U with PCP and dentist.    Tanna Furry, MD 11/10/15 [phone]

## 2015-11-10 NOTE — ED Notes (Signed)
Multiple complaints:  Pt states not feeling well x 2 weeks.  Hasn't been eating or drinking.  No vomiting.  Out of his anxiety meds and not sleeping.  Pt having abdominal pain and out of meds for his ulcers.  Pt also c/o sinus headache.  Sinus infection.  Leg cramping.  Diaphoretic at home.  Not eating also d/t bad teeth.

## 2015-11-10 NOTE — ED Notes (Signed)
Patient was alert, oriented and stable upon discharge. RN went over AVS and patient had no further questions.  

## 2015-11-10 NOTE — Discharge Instructions (Signed)
You have a lot of ongoing medical concerns atorvastatin by your primary care physician.  We do not refill provide controlled substances such as lorazepam through the emergency room. This will have to be through your primary care physician.  If you're taking more lorazepam than your scheduled to, you will have to wait until Saturday for urinary prescription to become available.  If you are thirsty, push fluids and drink more.  He will need to contact her dentist and/or oral surgeon to arrange for treatment for your chronically poor teeth.

## 2016-03-18 ENCOUNTER — Encounter (HOSPITAL_COMMUNITY): Payer: Self-pay | Admitting: *Deleted

## 2016-03-18 ENCOUNTER — Emergency Department (HOSPITAL_COMMUNITY)
Admission: EM | Admit: 2016-03-18 | Discharge: 2016-03-18 | Disposition: A | Discharge status: Home / Self Care | Payer: Medicaid Other | Attending: Emergency Medicine | Admitting: Emergency Medicine

## 2016-03-18 DIAGNOSIS — Z79899 Other long term (current) drug therapy: Secondary | ICD-10-CM | POA: Insufficient documentation

## 2016-03-18 DIAGNOSIS — Z87891 Personal history of nicotine dependence: Secondary | ICD-10-CM | POA: Insufficient documentation

## 2016-03-18 DIAGNOSIS — R110 Nausea: Secondary | ICD-10-CM | POA: Diagnosis not present

## 2016-03-18 DIAGNOSIS — I10 Essential (primary) hypertension: Secondary | ICD-10-CM | POA: Diagnosis not present

## 2016-03-18 DIAGNOSIS — K047 Periapical abscess without sinus: Secondary | ICD-10-CM

## 2016-03-18 DIAGNOSIS — K0889 Other specified disorders of teeth and supporting structures: Secondary | ICD-10-CM | POA: Diagnosis present

## 2016-03-18 MED ORDER — CLINDAMYCIN HCL 150 MG PO CAPS: 450.0000 mg | ORAL_CAPSULE | Freq: Three times a day (TID) | ORAL | 0 refills | Status: DC

## 2016-03-18 NOTE — ED Triage Notes (Signed)
Patient to ED from home via his neighbor with c/o dental pain and dental decay for "months and years."  He has an appt with an oral surgeon on November 7 of this year, but he is afraid he will get infected.  Patient endorses mild nausea, but denies vomiting and fever.  Patient has some edema to right jaw and right lower gums.  He has multiple dental carries.  Patient has been taking Amoxicillin that he had left over.  Patient takes Hydrocodone for his chronic back pain and that is not helping his dental pain.  He states he cannot take ibuprofen because he has ulcers.

## 2016-03-18 NOTE — ED Provider Notes (Signed)
[facility] DEPT Provider Note   CSN: [id number] Arrival date & time: 03/18/16  1310  By signing my name below, I, Maren Reamer, attest that this documentation has been prepared under the direction and in the presence of [employer], PA-C. Electronically Signed: Maren Reamer, Scribe. 03/18/16. 6:24 PM.    History   Chief Complaint Chief Complaint  Patient presents with  . Dental Pain    HPI Comments:  Anthony Lamb is a 60 y.o. male who presents to the Emergency Department complaining of dental pain in the right lower jaw area that began years ago. Has edema in the right lower jaw and gum areas. He has leftover Amoxicillin that he has been taking for his abscessed teeth and also takes Hydrocodone for chronic back pain, which has not improved his dental pain. Denies vomiting or fever but does have mild nausea.   Has an appointment to see his oral surgeon on 04/11/16 but he is afraid that the teeth will worsen before this appointment.    The history is provided by the patient. No language interpreter was used.  Dental Pain      Past Medical History:  Diagnosis Date  . Alcohol abuse, in remission   . Anemia   . Anxiety   . Chronic pain   . Colitis   . Depression   . Esophagitis   . Gastritis   . GERD (gastroesophageal reflux disease)   . Hypertension   . IBS (irritable bowel syndrome)   . Poor dentition     Patient Active Problem List   Diagnosis Date Noted  . Osteopenia determined by x-ray 08/06/2014  . Stress fracture of calcaneus 08/06/2014  . Vitamin D deficiency 12/18/2013  . Dementia 12/18/2013  . Benign microscopic hematuria 07/06/2013  . 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 Ankylosing spondylitis ([place]) 06/20/2013  . Malnutrition of moderate degree ([place]) 06/20/2013  . Depression with anxiety 05/31/2013  . BPH (benign prostatic hyperplasia) 08/22/2010  . Backache 04/19/2010  . ALCOHOLISM 08/13/2008  . History of  colonic polyps 08/13/2008  . Essential hypertension 07/03/2007  . PUD (peptic ulcer disease) 07/03/2007  . Irritable bowel syndrome 07/03/2007    Past Surgical History:  Procedure Laterality Date  . COLONOSCOPY W/ BIOPSIES    . ESOPHAGOGASTRODUODENOSCOPY    . HERNIA REPAIR Bilateral   . TONSILLECTOMY         Home Medications    Prior to Admission medications   Medication Sig Start Date End Date Taking? Authorizing Provider  amoxicillin-clavulanate (AUGMENTIN) 875-125 MG tablet Take 1 tablet by mouth every 12 (twelve) hours. 08/13/15   Olivia Canter Sam, PA-C  clindamycin (CLEOCIN) 150 MG capsule Take 3 capsules (450 mg total) by mouth 3 (three) times daily. 03/18/16   Miko Markwood, PA-C  fluticasone (FLONASE) 50 MCG/ACT nasal spray Place 2 sprays into both nostrils daily. Patient taking differently: Place 2 sprays into both nostrils daily as needed for allergies.  10/14/14   Golden Circle, FNP  HYDROcodone-acetaminophen (NORCO/VICODIN) 5-325 MG tablet TK 1 T PO TID PRN FOR PAIN 07/20/15   Historical Provider, MD  hydrOXYzine (ATARAX/VISTARIL) 25 MG tablet TK 1 T PO TID PRN FOR ANXIETY AND ITCHING 08/06/15   Historical Provider, MD  hydrOXYzine (ATARAX/VISTARIL) 50 MG tablet TAKE 2 TABLETS BY MOUTH THREE TIMES DAILY. MUST SEE NEW DOCTOR FOR FUTURE REFILLS. Patient not taking: Reported on 08/13/2015 11/19/14   Janith Lima, MD  lisinopril-hydrochlorothiazide (PRINZIDE,ZESTORETIC) 10-12.5 MG tablet TK 1 T  PO D 07/25/15   Historical Provider, MD  LORazepam (ATIVAN) 0.5 MG tablet TK 1 T PO Every 8 HRS 07/20/15   Historical Provider, MD  LORazepam (ATIVAN) 1 MG tablet Take 1 tablet (1 mg total) by mouth every 8 (eight) hours. Patient not taking: Reported on 08/13/2015 12/23/14   Lacretia Leigh, MD  pantoprazole (PROTONIX) 40 MG tablet Take 1 tablet (40 mg total) by mouth daily. 04/24/14   Janith Lima, MD  sucralfate (CARAFATE) 1 G tablet Take 1 tablet (1 g total) by mouth 4 (four) times daily -   with meals and at bedtime. Chew before swallowing 12/17/13   Janith Lima, MD  traMADol (ULTRAM) 50 MG tablet Take 1 tablet (50 mg total) by mouth every 8 (eight) hours as needed. Patient not taking: Reported on 08/13/2015 12/23/14   Lacretia Leigh, MD  verapamil (CALAN-SR) 240 MG CR tablet TK 1 T PO QHS 08/25/14   Historical Provider, MD  Vitamin D, Ergocalciferol, (DRISDOL) 50000 UNITS CAPS capsule TAKE 1 CAPSULE BY MOUTH ONCE A WEEK 11/19/14   Janith Lima, MD    Family History Family History  Problem Relation Age of Onset  . Anxiety disorder Mother   . Congestive Heart Failure Mother   . Crohn's disease Mother   . Arthritis Father   . High blood pressure Father     Social History Social History  Substance Use Topics  . Smoking status: Former Smoker    Types: Cigarettes  . Smokeless tobacco: Never Used  . Alcohol use No     Allergies   Diphenhydramine hcl; Flexeril [cyclobenzaprine]; and Nsaids   Review of Systems Review of Systems  Constitutional: Negative for fever.  HENT:       Dental pain, right lower jaw and gums with edema  Gastrointestinal: Positive for nausea. Negative for vomiting.  All other systems reviewed and are negative.    Physical Exam Updated Vital Signs BP 149/71 (BP Location: Right Arm)   Pulse 80   Temp 98.4 F (36.9 C) (Oral)   Resp 16   Ht 5' 8"  (1.727 m)   Wt 89.8 kg   SpO2 98%   BMI 30.11 kg/m    Physical Exam  Constitutional: He is oriented to person, place, and time. He appears well-developed and well-nourished.  HENT:  Head: Normocephalic and atraumatic.  Right-sided swelling to the face, no redness, no obvious fluctuance on external exam. Internal exam shows very poor dentition with remaining teeth with severe dental decay. Patient has swelling along the right mid jaw, he has no fluctuance, no swelling to the lingual side of his jaw, no swelling or tenderness to the floor the mouth. Neck is supple with full active range of  motion.  Eyes: Conjunctivae are normal. Pupils are equal, round, and reactive to light. Right eye exhibits no discharge. Left eye exhibits no discharge. No scleral icterus.  Neck: Normal range of motion. No JVD present. No tracheal deviation present.  Pulmonary/Chest: Effort normal. No stridor.  Neurological: He is alert and oriented to person, place, and time. Coordination normal.  Psychiatric: He has a normal mood and affect. His behavior is normal. Judgment and thought content normal.  Nursing note and vitals reviewed.     ED Treatments / Results  Labs (all labs ordered are listed, but only abnormal results are displayed) Labs Reviewed - No data to display  EKG  EKG Interpretation None       Radiology No results found.  Procedures Procedures (  including critical care time)  Medications Ordered in ED Medications - No data to display   Initial Impression / Assessment and Plan / ED Course  I have reviewed the triage vital signs and the nursing notes.  Pertinent labs & imaging results that were available during my care of the patient were reviewed by me and considered in my medical decision making (see chart for details).  Clinical Course   Labs:  Imaging:   Consults:  Therapeutics:  Discharge Meds:   Assessment/Plan:   60 year old male presents today with dental infection. No appreciable abscess, bedside ultrasound shows no collection of fluid that would be amenable to drainage. Patient is afebrile and nontoxic. He will be started on clindamycin, given pain medication, encouraged follow-up with his dentist as previously instructed. He is also encouraged to contact the on-call dentist and dental resources in attempt to follow up sooner. He is instructed return immediately if he expresses any new or worsening signs or symptoms. Patient verbalized understanding and agreement to today's plan had no further questions concerns   I personally performed the services  described in this documentation, which was scribed in my presence. The recorded information has been reviewed and is accurate.  Final Clinical Impressions(s) / ED Diagnoses   Final diagnoses:  Dental infection    New Prescriptions Discharge Medication List as of 03/18/2016  2:58 PM    START taking these medications   Details  clindamycin (CLEOCIN) 150 MG capsule Take 3 capsules (450 mg total) by mouth 3 (three) times daily., Starting Sat 03/18/2016, Print         Okey Regal, PA-C 03/18/16 1825    Daleen Bo, MD 03/18/16 2312

## 2016-03-18 NOTE — Discharge Instructions (Signed)
Please follow-up with oral surgeon as previously scheduled. Please contact dentist that I have provided and request follow-up evaluation. Please return to the emergency room immediately if it expands any new or worsening signs or symptoms.

## 2016-06-16 ENCOUNTER — Encounter (HOSPITAL_COMMUNITY): Admission: RE | Payer: Self-pay | Source: Ambulatory Visit

## 2016-06-16 ENCOUNTER — Ambulatory Visit (HOSPITAL_COMMUNITY): Admission: RE | Admit: 2016-06-16 | Payer: Medicaid Other | Source: Ambulatory Visit | Admitting: Oral Surgery

## 2016-06-16 SURGERY — MULTIPLE EXTRACTION WITH ALVEOLOPLASTY
Anesthesia: General

## 2016-06-20 ENCOUNTER — Encounter (HOSPITAL_COMMUNITY): Payer: Self-pay

## 2016-06-20 ENCOUNTER — Emergency Department (HOSPITAL_COMMUNITY)
Admission: EM | Admit: 2016-06-20 | Discharge: 2016-06-20 | Disposition: A | Discharge status: Home / Self Care | Payer: Medicaid Other | Attending: Emergency Medicine | Admitting: Emergency Medicine

## 2016-06-20 DIAGNOSIS — E871 Hypo-osmolality and hyponatremia: Secondary | ICD-10-CM | POA: Diagnosis not present

## 2016-06-20 DIAGNOSIS — I10 Essential (primary) hypertension: Secondary | ICD-10-CM | POA: Insufficient documentation

## 2016-06-20 DIAGNOSIS — G4700 Insomnia, unspecified: Secondary | ICD-10-CM | POA: Insufficient documentation

## 2016-06-20 DIAGNOSIS — Z87891 Personal history of nicotine dependence: Secondary | ICD-10-CM | POA: Diagnosis not present

## 2016-06-20 DIAGNOSIS — F419 Anxiety disorder, unspecified: Secondary | ICD-10-CM | POA: Diagnosis present

## 2016-06-20 LAB — URINALYSIS, ROUTINE W REFLEX MICROSCOPIC
BILIRUBIN URINE: NEGATIVE
Bacteria, UA: NONE SEEN
GLUCOSE, UA: NEGATIVE mg/dL
KETONES UR: NEGATIVE mg/dL
LEUKOCYTES UA: NEGATIVE
NITRITE: NEGATIVE
Protein, ur: NEGATIVE mg/dL
SPECIFIC GRAVITY, URINE: 1.002 — AB (ref 1.005–1.030)
Squamous Epithelial / LPF: NONE SEEN
WBC, UA: NONE SEEN WBC/hpf (ref 0–5)
pH: 7 (ref 5.0–8.0)

## 2016-06-20 LAB — BASIC METABOLIC PANEL
ANION GAP: 9 (ref 5–15)
BUN: 7 mg/dL (ref 6–20)
CALCIUM: 9.4 mg/dL (ref 8.9–10.3)
CO2: 29 mmol/L (ref 22–32)
CREATININE: 0.73 mg/dL (ref 0.61–1.24)
Chloride: 87 mmol/L — ABNORMAL LOW (ref 101–111)
Glucose, Bld: 118 mg/dL — ABNORMAL HIGH (ref 65–99)
Potassium: 4 mmol/L (ref 3.5–5.1)
SODIUM: 125 mmol/L — AB (ref 135–145)

## 2016-06-20 LAB — CBC WITH DIFFERENTIAL/PLATELET
BASOS ABS: 0 10*3/uL (ref 0.0–0.1)
BASOS PCT: 0 %
EOS ABS: 0.1 10*3/uL (ref 0.0–0.7)
EOS PCT: 0 %
HEMATOCRIT: 36.7 % — AB (ref 39.0–52.0)
Hemoglobin: 12.9 g/dL — ABNORMAL LOW (ref 13.0–17.0)
Lymphocytes Relative: 15 %
Lymphs Abs: 1.8 10*3/uL (ref 0.7–4.0)
MCH: 30.4 pg (ref 26.0–34.0)
MCHC: 35.1 g/dL (ref 30.0–36.0)
MCV: 86.6 fL (ref 78.0–100.0)
MONO ABS: 1.4 10*3/uL — AB (ref 0.1–1.0)
Monocytes Relative: 12 %
Neutro Abs: 8.5 10*3/uL — ABNORMAL HIGH (ref 1.7–7.7)
Neutrophils Relative %: 73 %
PLATELETS: 376 10*3/uL (ref 150–400)
RBC: 4.24 MIL/uL (ref 4.22–5.81)
RDW: 12.4 % (ref 11.5–15.5)
WBC: 11.8 10*3/uL — ABNORMAL HIGH (ref 4.0–10.5)

## 2016-06-20 MED ORDER — ONDANSETRON HCL 4 MG/2ML IJ SOLN
4.0000 mg | Freq: Once | INTRAMUSCULAR | Status: AC
  Administered 2016-06-20: 4 mg via INTRAVENOUS
  Filled 2016-06-20: qty 2

## 2016-06-20 MED ORDER — ONDANSETRON 4 MG PO TBDP: 4.0000 mg | ORAL_TABLET | Freq: Three times a day (TID) | ORAL | 0 refills | Status: DC | PRN

## 2016-06-20 MED ORDER — LORAZEPAM 0.5 MG PO TABS
0.5000 mg | ORAL_TABLET | Freq: Three times a day (TID) | ORAL | 0 refills | Status: DC | PRN
Start: 2016-06-20 — End: 2016-11-08

## 2016-06-20 MED ORDER — LORAZEPAM 2 MG/ML IJ SOLN
0.5000 mg | Freq: Once | INTRAMUSCULAR | Status: AC
  Administered 2016-06-20: 0.5 mg via INTRAVENOUS
  Filled 2016-06-20: qty 1

## 2016-06-20 MED ORDER — SODIUM CHLORIDE 0.9 % IV BOLUS (SEPSIS)
1000.0000 mL | Freq: Once | INTRAVENOUS | Status: AC
  Administered 2016-06-20: 1000 mL via INTRAVENOUS

## 2016-06-20 NOTE — ED Triage Notes (Signed)
Pt states that he's very dehydrated and he hasn't eaten or had any sleep in two days, he says that he ran out of his lorazepam and he needs it for sleep

## 2016-06-20 NOTE — Discharge Instructions (Addendum)
You have been seen today for anxiety and insomnia. You are in transition off of your chronic lorazepam use. Your PCP will need to handle this transition. Please call your PCPs office today as soon as possible. Look to the attached instructions for techniques to help you with sleep. You were also noted to chronically have low sodium on your blood tests today. This may need to be monitored or addressed by your PCP, but the cause is likely to be lack of proper hydration and nutrition, as well as chronic alcohol intake. It is also very important that you regularly drink water to avoid dehydration.

## 2016-06-20 NOTE — ED Notes (Signed)
Pt reports that he feels dehydrated and has ran out of his ativan early due to spilling it in the sink. Pt reports muscle twitches and spasms. Pt reports that he has not slept in days and has been on Ativan for the last 7 years.

## 2016-06-20 NOTE — ED Provider Notes (Signed)
Anthony Lamb Provider Note   CSN: [id number] Arrival date & time: 06/20/16  0522     History   Chief Complaint Chief Complaint  Patient presents with  . Anxiety  . Dehydration    HPI Anthony Lamb is a 61 y.o. male.  HPI   Anthony Lamb is a 61 y.o. male, with a history of Alcohol abuse, hypertension, and anxiety, presenting to the ED with complaint of dehydration and anxiety. States he has bad teeth and sinus congestion which has prevented him from eating and drinking over the past 2 days. Adds that he usually takes lorazepam for sleep and anxiety, but ran out two days ago. Has not slept in two days.  Pt states he saw his PCP yesterday and was taken off the lorazepam at that time. He was switched to Buspar and Paxil for his anxiety. He was prescribed hydroxyzine for sleep and anxiety. Pt states these medications have not helped him sleep. He has tried taking up to 100 mg of hydroxyzine last night with no improvement in his ability to sleep. Endorses daily alcohol use of "2-3 beers each night." Also endorses some nausea and muscle cramps, which he says he gets when he is dehydrated. Muscle cramps and nausea have been intermittent for several months.  Denies vomiting/diarrhea, SOB, CP, abdominal pain, or any other complaints.    Past Medical History:  Diagnosis Date  . Alcohol abuse, in remission   . Anemia   . Anxiety   . Chronic pain   . Colitis   . Depression   . Esophagitis   . Gastritis   . GERD (gastroesophageal reflux disease)   . Hypertension   . IBS (irritable bowel syndrome)   . Poor dentition     Patient Active Problem List   Diagnosis Date Noted  . Osteopenia determined by x-ray 08/06/2014  . Stress fracture of calcaneus 08/06/2014  . Vitamin D deficiency 12/18/2013  . Dementia 12/18/2013  . Benign microscopic hematuria 07/06/2013  . 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 Ankylosing spondylitis ([place]) 06/20/2013   . Malnutrition of moderate degree ([place]) 06/20/2013  . Depression with anxiety 05/31/2013  . BPH (benign prostatic hyperplasia) 08/22/2010  . Backache 04/19/2010  . ALCOHOLISM 08/13/2008  . History of colonic polyps 08/13/2008  . Essential hypertension 07/03/2007  . PUD (peptic ulcer disease) 07/03/2007  . Irritable bowel syndrome 07/03/2007    Past Surgical History:  Procedure Laterality Date  . COLONOSCOPY W/ BIOPSIES    . ESOPHAGOGASTRODUODENOSCOPY    . HERNIA REPAIR Bilateral   . TONSILLECTOMY         Home Medications    Prior to Admission medications   Medication Sig Start Date End Date Taking? Authorizing Provider  fluticasone (FLONASE) 50 MCG/ACT nasal spray Place 2 sprays into both nostrils daily. Patient taking differently: Place 2 sprays into both nostrils daily as needed for allergies.  10/14/14  Yes Golden Circle, FNP  HYDROcodone-acetaminophen (NORCO/VICODIN) 5-325 MG tablet Take one tablet by mouth three times daily as needed for pain. 07/20/15  Yes Historical Provider, MD  hydrOXYzine (VISTARIL) 25 MG capsule Take 25 mg by mouth 3 (three) times daily as needed for anxiety.   Yes Historical Provider, MD  lisinopril-hydrochlorothiazide (PRINZIDE,ZESTORETIC) 10-12.5 MG tablet TK 1 T PO D 07/25/15  Yes Historical Provider, MD  pantoprazole (PROTONIX) 40 MG tablet Take 1 tablet (40 mg total) by mouth daily. 04/24/14  Yes Janith Lima, MD  sucralfate ([place]) 1  G tablet Take 1 tablet (1 g total) by mouth 4 (four) times daily -  with meals and at bedtime. Chew before swallowing 12/17/13  Yes Janith Lima, MD  verapamil (CALAN-SR) 240 MG CR tablet Take one tablet my mouth daily. 08/25/14  Yes Historical Provider, MD  amoxicillin-clavulanate (AUGMENTIN) 875-125 MG tablet Take 1 tablet by mouth every 12 (twelve) hours. Patient not taking: Reported on 06/20/2016 08/13/15   Olivia Canter Sam, PA-C  clindamycin (CLEOCIN) 150 MG capsule Take 3 capsules (450 mg total) by mouth 3  (three) times daily. Patient not taking: Reported on 06/20/2016 03/18/16   Okey Regal, PA-C  hydrOXYzine (ATARAX/VISTARIL) 50 MG tablet TAKE 2 TABLETS BY MOUTH THREE TIMES DAILY. MUST SEE NEW DOCTOR FOR FUTURE REFILLS. Patient not taking: Reported on 08/13/2015 11/19/14   Janith Lima, MD  LORazepam (ATIVAN) 0.5 MG tablet Take 1 tablet (0.5 mg total) by mouth 3 (three) times daily as needed for anxiety or sleep. 06/20/16   Jakhia Buxton C Amariana Mirando, PA-C  ondansetron (ZOFRAN ODT) 4 MG disintegrating tablet Take 1 tablet (4 mg total) by mouth every 8 (eight) hours as needed for nausea or vomiting. 06/20/16   Hulbert Branscome C Kseniya Grunden, PA-C  traMADol (ULTRAM) 50 MG tablet Take 1 tablet (50 mg total) by mouth every 8 (eight) hours as needed. Patient not taking: Reported on 08/13/2015 12/23/14   Lacretia Leigh, MD  Vitamin D, Ergocalciferol, (DRISDOL) 50000 UNITS CAPS capsule TAKE 1 CAPSULE BY MOUTH ONCE A WEEK Patient not taking: Reported on 06/20/2016 11/19/14   Janith Lima, MD    Family History Family History  Problem Relation Age of Onset  . Anxiety disorder Mother   . Congestive Heart Failure Mother   . Crohn's disease Mother   . Arthritis Father   . High blood pressure Father     Social History Social History  Substance Use Topics  . Smoking status: Former Smoker    Types: Cigarettes  . Smokeless tobacco: Never Used  . Alcohol use No     Allergies   Diphenhydramine hcl; Flexeril [cyclobenzaprine]; and Nsaids   Review of Systems Review of Systems  Constitutional: Negative for chills and fever.  HENT: Positive for congestion.   Respiratory: Negative for shortness of breath.   Cardiovascular: Negative for chest pain.  Gastrointestinal: Positive for nausea. Negative for abdominal pain, diarrhea and vomiting.  Psychiatric/Behavioral: Positive for sleep disturbance. The patient is nervous/anxious.   All other systems reviewed and are negative.    Physical Exam Updated Vital Signs BP 157/81 (BP  Location: Right Arm)   Pulse 71   Temp 97.9 F (36.6 C) (Oral)   Resp 20   Ht 5' 11"  (1.803 m)   Wt 93 kg   SpO2 100%   BMI 28.59 kg/m   Physical Exam  Constitutional: He is oriented to person, place, and time. He appears well-developed and well-nourished. No distress.  HENT:  Head: Normocephalic and atraumatic.  Mouth/Throat: Oropharynx is clear and moist.  Eyes: Conjunctivae and EOM are normal. Pupils are equal, round, and reactive to light.  Neck: Normal range of motion. Neck supple.  Cardiovascular: Normal rate, regular rhythm, normal heart sounds and intact distal pulses.   Pulmonary/Chest: Effort normal and breath sounds normal. No respiratory distress.  Abdominal: Soft. There is no tenderness. There is no guarding.  Musculoskeletal: He exhibits no edema.  Lymphadenopathy:    He has no cervical adenopathy.  Neurological: He is alert and oriented to person, place, and time.  No sensory deficits. Strength 5/5 in all extremities. No gait disturbance. Coordination intact including heel to shin and finger to nose. Cranial nerves III-XII grossly intact. No facial droop.   Skin: Skin is warm and dry. He is not diaphoretic.  Psychiatric: He has a normal mood and affect. His behavior is normal.  Nursing note and vitals reviewed.    ED Treatments / Results  Labs (all labs ordered are listed, but only abnormal results are displayed) Labs Reviewed  CBC WITH DIFFERENTIAL/PLATELET - Abnormal; Notable for the following:       Result Value   WBC 11.8 (*)    Hemoglobin 12.9 (*)    HCT 36.7 (*)    Neutro Abs 8.5 (*)    Monocytes Absolute 1.4 (*)    All other components within normal limits  BASIC METABOLIC PANEL - Abnormal; Notable for the following:    Sodium 125 (*)    Chloride 87 (*)    Glucose, Bld 118 (*)    All other components within normal limits  URINALYSIS, ROUTINE W REFLEX MICROSCOPIC    EKG  EKG Interpretation None       Radiology No results  found.  Procedures Procedures (including critical care time)  Medications Ordered in ED Medications  sodium chloride 0.9 % bolus 1,000 mL (0 mLs Intravenous Stopped 06/20/16 0743)  ondansetron (ZOFRAN) injection 4 mg (4 mg Intravenous Given 06/20/16 0645)  LORazepam (ATIVAN) injection 0.5 mg (0.5 mg Intravenous Given 06/20/16 0654)     Initial Impression / Assessment and Plan / ED Course  I have reviewed the triage vital signs and the nursing notes.  Pertinent labs & imaging results that were available during my care of the patient were reviewed by me and considered in my medical decision making (see chart for details).  Clinical Course     Patient presents with a complaint of insomnia and possible dehydration. Patient seems to be having some minor difficulty transitioning off his lorazepam. No signs of withdrawal on physical exam or patient's vital signs. Patient will need to follow up with his PCP for continued and chronic management of this issue. Patient's hyponatremia is noted, but was also noted to be at a similar level last year. Suspect the original cause of hyponatremia may be poor oral intake combined with chronic alcohol use. If necessary, this will need to be corrected carefully and over time. This is best done by his PCP.  Patient states he feels much better following IV fluids and the dose of Ativan. Patient counseled on proper hydration and nutrition. Patient was also counseled on the timeline for effectiveness of medications like Buspar and Paxil. Patient was advised to follow-up with his PCP again today for management of his insomnia and other symptoms. Return precautions discussed. Patient voiced understanding of all instructions and is comfortable with discharge.  Findings and plan of care discussed with Shanon Rosser, MD and then with Dorie Rank, MD after EDP shift change.   Vitals:   06/20/16 0529 06/20/16 0530 06/20/16 0834  BP: 157/81  141/79  Pulse: 71  73  Resp: 20   18  Temp: 97.9 F (36.6 C)  97.9 F (36.6 C)  TempSrc: Oral  Oral  SpO2: 100%  100%  Weight:  93 kg   Height:  5' 11"  (1.803 m)      Final Clinical Impressions(s) / ED Diagnoses   Final diagnoses:  Anxiety  Insomnia, unspecified type  Hyponatremia    New Prescriptions Discharge Medication List as  of 06/20/2016  7:51 AM    START taking these medications   Details  ondansetron (ZOFRAN ODT) 4 MG disintegrating tablet Take 1 tablet (4 mg total) by mouth every 8 (eight) hours as needed for nausea or vomiting., Starting Tue 06/20/2016, [place], PA-C 06/20/16 [place], MD 06/20/16 2241

## 2016-07-21 ENCOUNTER — Ambulatory Visit (HOSPITAL_COMMUNITY): Admission: RE | Admit: 2016-07-21 | Payer: Medicaid Other | Source: Ambulatory Visit | Admitting: Oral Surgery

## 2016-07-21 ENCOUNTER — Encounter (HOSPITAL_COMMUNITY): Admission: RE | Payer: Self-pay | Source: Ambulatory Visit

## 2016-07-21 SURGERY — DENTAL RESTORATION/EXTRACTIONS
Anesthesia: General

## 2016-07-23 ENCOUNTER — Emergency Department (HOSPITAL_COMMUNITY)
Admission: EM | Admit: 2016-07-23 | Discharge: 2016-07-23 | Disposition: A | Discharge status: Home / Self Care | Payer: Medicaid Other | Attending: Emergency Medicine | Admitting: Emergency Medicine

## 2016-07-23 ENCOUNTER — Encounter (HOSPITAL_COMMUNITY): Payer: Self-pay | Admitting: Emergency Medicine

## 2016-07-23 DIAGNOSIS — K529 Noninfective gastroenteritis and colitis, unspecified: Secondary | ICD-10-CM | POA: Insufficient documentation

## 2016-07-23 DIAGNOSIS — I10 Essential (primary) hypertension: Secondary | ICD-10-CM | POA: Insufficient documentation

## 2016-07-23 DIAGNOSIS — Z87891 Personal history of nicotine dependence: Secondary | ICD-10-CM | POA: Insufficient documentation

## 2016-07-23 DIAGNOSIS — K209 Esophagitis, unspecified without bleeding: Secondary | ICD-10-CM

## 2016-07-23 DIAGNOSIS — K2970 Gastritis, unspecified, without bleeding: Secondary | ICD-10-CM

## 2016-07-23 DIAGNOSIS — F419 Anxiety disorder, unspecified: Secondary | ICD-10-CM | POA: Diagnosis not present

## 2016-07-23 DIAGNOSIS — Z79899 Other long term (current) drug therapy: Secondary | ICD-10-CM | POA: Diagnosis not present

## 2016-07-23 DIAGNOSIS — R1013 Epigastric pain: Secondary | ICD-10-CM | POA: Diagnosis present

## 2016-07-23 LAB — CBC WITH DIFFERENTIAL/PLATELET
BASOS ABS: 0 10*3/uL (ref 0.0–0.1)
Basophils Relative: 0 %
EOS ABS: 0.1 10*3/uL (ref 0.0–0.7)
EOS PCT: 1 %
HCT: 37.5 % — ABNORMAL LOW (ref 39.0–52.0)
Hemoglobin: 13.3 g/dL (ref 13.0–17.0)
LYMPHS PCT: 10 %
Lymphs Abs: 1.3 10*3/uL (ref 0.7–4.0)
MCH: 30.3 pg (ref 26.0–34.0)
MCHC: 35.5 g/dL (ref 30.0–36.0)
MCV: 85.4 fL (ref 78.0–100.0)
MONO ABS: 1.3 10*3/uL — AB (ref 0.1–1.0)
Monocytes Relative: 10 %
Neutro Abs: 10.8 10*3/uL — ABNORMAL HIGH (ref 1.7–7.7)
Neutrophils Relative %: 79 %
PLATELETS: 404 10*3/uL — AB (ref 150–400)
RBC: 4.39 MIL/uL (ref 4.22–5.81)
RDW: 12.1 % (ref 11.5–15.5)
WBC: 13.6 10*3/uL — AB (ref 4.0–10.5)

## 2016-07-23 LAB — URINALYSIS, ROUTINE W REFLEX MICROSCOPIC
Bilirubin Urine: NEGATIVE
GLUCOSE, UA: NEGATIVE mg/dL
HGB URINE DIPSTICK: NEGATIVE
Ketones, ur: NEGATIVE mg/dL
LEUKOCYTES UA: NEGATIVE
Nitrite: NEGATIVE
Protein, ur: NEGATIVE mg/dL
Specific Gravity, Urine: 1.008 (ref 1.005–1.030)
pH: 8 (ref 5.0–8.0)

## 2016-07-23 LAB — COMPREHENSIVE METABOLIC PANEL
ALBUMIN: 4.1 g/dL (ref 3.5–5.0)
ALK PHOS: 76 U/L (ref 38–126)
ALT: 47 U/L (ref 17–63)
ANION GAP: 11 (ref 5–15)
AST: 32 U/L (ref 15–41)
BUN: 6 mg/dL (ref 6–20)
CO2: 24 mmol/L (ref 22–32)
Calcium: 9.1 mg/dL (ref 8.9–10.3)
Chloride: 87 mmol/L — ABNORMAL LOW (ref 101–111)
Creatinine, Ser: 0.69 mg/dL (ref 0.61–1.24)
GFR calc Af Amer: >60 mL/min (ref >60)
GFR calc non Af Amer: >60 mL/min (ref >60)
GLUCOSE: 114 mg/dL — AB (ref 65–99)
POTASSIUM: 4 mmol/L (ref 3.5–5.1)
SODIUM: 122 mmol/L — AB (ref 135–145)
Total Bilirubin: 1 mg/dL (ref 0.3–1.2)
Total Protein: 7.1 g/dL (ref 6.5–8.1)

## 2016-07-23 LAB — LIPASE, BLOOD: Lipase: 30 U/L (ref 11–51)

## 2016-07-23 MED ORDER — THIAMINE HCL 100 MG/ML IJ SOLN
100.0000 mg | Freq: Once | INTRAMUSCULAR | Status: AC
  Administered 2016-07-23: 100 mg via INTRAVENOUS
  Filled 2016-07-23: qty 2

## 2016-07-23 MED ORDER — LORAZEPAM 1 MG PO TABS
1.0000 mg | ORAL_TABLET | Freq: Once | ORAL | Status: AC
  Administered 2016-07-23: 1 mg via ORAL
  Filled 2016-07-23: qty 1

## 2016-07-23 MED ORDER — HYDROCODONE-ACETAMINOPHEN 5-325 MG PO TABS
2.0000 | ORAL_TABLET | Freq: Once | ORAL | Status: AC
  Administered 2016-07-23: 2 via ORAL
  Filled 2016-07-23: qty 2

## 2016-07-23 MED ORDER — GI COCKTAIL ~~LOC~~
30.0000 mL | Freq: Once | ORAL | Status: AC
  Administered 2016-07-23: 30 mL via ORAL
  Filled 2016-07-23: qty 30

## 2016-07-23 NOTE — ED Provider Notes (Signed)
[facility] DEPT Provider Note   CSN: [id number] Arrival date & time: 07/23/16  0756     History   Chief Complaint Chief Complaint  Patient presents with  . Abdominal Pain    HPI Anthony Lamb is a 61 y.o. male who  has a past medical history of Alcohol abuse, in remission; Anemia; Anxiety; Chronic pain; Colitis; Depression; Esophagitis; Gastritis; GERD (gastroesophageal reflux disease); Hypertension; IBS (irritable bowel syndrome); and Poor dentition. The patient presents with a chief complaint of burning pain in his epigastrium and throat. He states that it is worse when he lies flat. He feels sick. He can't eat or drink well. He has a history of chronic alcohol abuse and gastric ulcers is currently on a PPI. The patient is extremely anxious and agitated. He states that he has been feeling unwell for some time, but h he owes back taxes and now his house is being foreclosed upon. He is currently talking to himself, worried about what he is given a DuoNeb with urinating about his situation during the interview. The patient denies nausea or vomiting. He states he cannot take care of himself well since his mother died because he cannot pay his bills.  He denies Chest pain, shortness of breath, urinary symptoms.  HPI  Past Medical History:  Diagnosis Date  . Alcohol abuse, in remission   . Anemia   . Anxiety   . Chronic pain   . Colitis   . Depression   . Esophagitis   . Gastritis   . GERD (gastroesophageal reflux disease)   . Hypertension   . IBS (irritable bowel syndrome)   . Poor dentition     Patient Active Problem List   Diagnosis Date Noted  . Osteopenia determined by x-ray 08/06/2014  . Stress fracture of calcaneus 08/06/2014  . Vitamin D deficiency 12/18/2013  . Dementia 12/18/2013  . Benign microscopic hematuria 07/06/2013  . 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 Ankylosing spondylitis ([place]) 06/20/2013  . Malnutrition of  moderate degree ([place]) 06/20/2013  . Depression with anxiety 05/31/2013  . BPH (benign prostatic hyperplasia) 08/22/2010  . Backache 04/19/2010  . ALCOHOLISM 08/13/2008  . History of colonic polyps 08/13/2008  . Essential hypertension 07/03/2007  . PUD (peptic ulcer disease) 07/03/2007  . Irritable bowel syndrome 07/03/2007    Past Surgical History:  Procedure Laterality Date  . COLONOSCOPY W/ BIOPSIES    . ESOPHAGOGASTRODUODENOSCOPY    . HERNIA REPAIR Bilateral   . TONSILLECTOMY         Home Medications    Prior to Admission medications   Medication Sig Start Date End Date Taking? Authorizing Provider  amoxicillin-clavulanate (AUGMENTIN) 875-125 MG tablet Take 1 tablet by mouth every 12 (twelve) hours. Patient not taking: Reported on 06/20/2016 08/13/15   Olivia Canter Sam, PA-C  clindamycin (CLEOCIN) 150 MG capsule Take 3 capsules (450 mg total) by mouth 3 (three) times daily. Patient not taking: Reported on 06/20/2016 03/18/16   Okey Regal, PA-C  fluticasone [facility]) 50 MCG/ACT nasal spray Place 2 sprays into both nostrils daily. Patient taking differently: Place 2 sprays into both nostrils daily as needed for allergies.  10/14/14   Golden Circle, FNP  HYDROcodone-acetaminophen (NORCO/VICODIN) 5-325 MG tablet Take one tablet by mouth three times daily as needed for pain. 07/20/15   Historical Provider, MD  hydrOXYzine (ATARAX/VISTARIL) 50 MG tablet TAKE 2 TABLETS BY MOUTH THREE TIMES DAILY. MUST SEE NEW DOCTOR FOR FUTURE REFILLS. Patient not taking: Reported  on 08/13/2015 11/19/14   Janith Lima, MD  hydrOXYzine (VISTARIL) 25 MG capsule Take 25 mg by mouth 3 (three) times daily as needed for anxiety.    Historical Provider, MD  lisinopril-hydrochlorothiazide (PRINZIDE,ZESTORETIC) 10-12.5 MG tablet TK 1 T PO D 07/25/15   Historical Provider, MD  LORazepam (ATIVAN) 0.5 MG tablet Take 1 tablet (0.5 mg total) by mouth 3 (three) times daily as needed for anxiety or sleep. 06/20/16    Shawn C Joy, PA-C  ondansetron (ZOFRAN ODT) 4 MG disintegrating tablet Take 1 tablet (4 mg total) by mouth every 8 (eight) hours as needed for nausea or vomiting. 06/20/16   Shawn C Joy, PA-C  pantoprazole (PROTONIX) 40 MG tablet Take 1 tablet (40 mg total) by mouth daily. 04/24/14   Janith Lima, MD  sucralfate (CARAFATE) 1 G tablet Take 1 tablet (1 g total) by mouth 4 (four) times daily -  with meals and at bedtime. Chew before swallowing 12/17/13   Janith Lima, MD  traMADol (ULTRAM) 50 MG tablet Take 1 tablet (50 mg total) by mouth every 8 (eight) hours as needed. Patient not taking: Reported on 08/13/2015 12/23/14   Lacretia Leigh, MD  verapamil (CALAN-SR) 240 MG CR tablet Take one tablet my mouth daily. 08/25/14   Historical Provider, MD  Vitamin D, Ergocalciferol, (DRISDOL) 50000 UNITS CAPS capsule TAKE 1 CAPSULE BY MOUTH ONCE A WEEK Patient not taking: Reported on 06/20/2016 11/19/14   Janith Lima, MD    Family History Family History  Problem Relation Age of Onset  . Anxiety disorder Mother   . Congestive Heart Failure Mother   . Crohn's disease Mother   . Arthritis Father   . High blood pressure Father     Social History Social History  Substance Use Topics  . Smoking status: Former Smoker    Types: Cigarettes  . Smokeless tobacco: Never Used  . Alcohol use No     Allergies   Diphenhydramine hcl; Flexeril [cyclobenzaprine]; and Nsaids   Review of Systems Review of Systems  Ten systems reviewed and are negative for acute change, except as noted in the HPI.   Physical Exam Updated Vital Signs BP 142/78 (BP Location: Left Arm)   Pulse 67   Temp 98.4 F (36.9 C) (Oral)   Resp 16   SpO2 98%   Physical Exam  Constitutional: He appears well-developed. No distress.  Extremely anxious. Patient is wringing his hands  HENT:  Head: Normocephalic and atraumatic.  Deep angular chelitis  Eyes: Conjunctivae are normal. No scleral icterus.  Neck: Normal range of motion.  Neck supple.  Cardiovascular: Normal rate, regular rhythm and normal heart sounds.   Pulmonary/Chest: Effort normal and breath sounds normal. No respiratory distress.  Abdominal: Soft. He exhibits no distension and no mass. There is no tenderness. There is no guarding.  Musculoskeletal: He exhibits no edema.  Neurological: He is alert.  Skin: Skin is warm and dry. He is not diaphoretic.  Psychiatric: His behavior is normal.  Nursing note and vitals reviewed.    ED Treatments / Results  Labs (all labs ordered are listed, but only abnormal results are displayed) Labs Reviewed  COMPREHENSIVE METABOLIC PANEL - Abnormal; Notable for the following:       Result Value   Sodium 122 (*)    Chloride 87 (*)    Glucose, Bld 114 (*)    All other components within normal limits  CBC WITH DIFFERENTIAL/PLATELET - Abnormal; Notable for the following:  WBC 13.6 (*)    HCT 37.5 (*)    Platelets 404 (*)    Neutro Abs 10.8 (*)    Monocytes Absolute 1.3 (*)    All other components within normal limits  URINALYSIS, ROUTINE W REFLEX MICROSCOPIC - Abnormal; Notable for the following:    Color, Urine STRAW (*)    All other components within normal limits  LIPASE, BLOOD    EKG  EKG Interpretation None       Radiology No results found.  Procedures Procedures (including critical care time)  Medications Ordered in ED Medications  LORazepam (ATIVAN) tablet 1 mg (1 mg Oral Given 07/23/16 0918)  gi cocktail (Maalox,Lidocaine,Donnatal) (30 mLs Oral Given 07/23/16 0918)  thiamine (B-1) injection 100 mg (100 mg Intravenous Given 07/23/16 0919)     Initial Impression / Assessment and Plan / ED Course  I have reviewed the triage vital signs and the nursing notes.  Pertinent labs & imaging results that were available during my care of the patient were reviewed by me and considered in my medical decision making (see chart for details).     Patient here with chronic epigastric pain. I think it  is symptoms are predominantly caused by his anxiety and social situation. Patient asks me for benzodiazepines, which I feel may be prescribed by his PCP or his pain management physician. I discussed this with the patient. He understands. The patient does not appear to have an emergent cause of his pain and his symptoms are resolved with GI cocktail. Do not feel that this represents acute coronary syndrome as he has persistent and chronic reflux. I have referred the patient to a gastroenterologist as he has not been to see one in about 8 years. I suggest EGD for further evaluation. Patient appears safe for discharge at this time  Final Clinical Impressions(s) / ED Diagnoses   Final diagnoses:  Gastroesophagitis  Anxiety    New Prescriptions New Prescriptions   No medications on file     Margarita Mail, PA-C 07/23/16 [place], DO 07/23/16 1205

## 2016-07-23 NOTE — ED Notes (Signed)
Bed: HV74 Expected date:  Expected time:  Means of arrival:  Comments: EMS-abdominal pain

## 2016-07-23 NOTE — ED Triage Notes (Addendum)
Per EMS-states generalized abdominal pain/RLQ-history of colitis-states he has a leak at his house which has caused mold-patient thinks he has cancer from mold-also having a headache-N/D-symptoms for about a week-patient goes to pain clinic

## 2016-07-23 NOTE — Discharge Instructions (Signed)
Follow these instructions at home: Take these actions to decrease your discomfort and to help avoid complications. Diet Follow a diet as recommended by your health care provider. This may involve avoiding foods and drinks such as: Coffee and tea (with or without caffeine). Drinks that contain alcohol. Energy drinks and sports drinks. Carbonated drinks or sodas. Chocolate and cocoa. Peppermint and mint flavorings. Garlic and onions. Horseradish. Spicy and acidic foods, including peppers, chili powder, curry powder, vinegar, hot sauces, and barbecue sauce. Citrus fruit juices and citrus fruits, such as oranges, lemons, and limes. Tomato-based foods, such as red sauce, chili, salsa, and pizza with red sauce. Fried and fatty foods, such as donuts, french fries, potato chips, and high-fat dressings. High-fat meats, such as hot dogs and fatty cuts of red and white meats, such as rib eye steak, sausage, ham, and bacon. High-fat dairy items, such as whole milk, butter, and cream cheese. Eat small, frequent meals instead of large meals. Avoid drinking large amounts of liquid with your meals. Avoid eating meals during the 2-3 hours before bedtime. Avoid lying down right after you eat. Do not exercise right after you eat. Avoid foods and drinks that seem to make your symptoms worse. General instructions Pay attention to any changes in your symptoms. Take over-the-counter and prescription medicines only as told by your health care provider. Do not take aspirin, ibuprofen, or other NSAIDs unless your health care provider told you to do so. If you have trouble taking pills, use a pill splitter to decrease the size of the pill. This will decrease the chance of the pill getting stuck or injuring your esophagus on the way down. Also, drink water after you take a pill. Do not use any tobacco products, including cigarettes, chewing tobacco, and e-cigarettes. If you need help quitting, ask your health care  provider. Wear loose-fitting clothing. Do not wear anything tight around your waist that causes pressure on your abdomen. Raise (elevate) the head of your bed about 6 inches (15 cm). Try to reduce your stress, such as with yoga or meditation. If you need help reducing stress, ask your health care provider. If you are overweight, reduce your weight to an amount that is healthy for you. Ask your health care provider for guidance about a safe weight loss goal. Keep all follow-up visits as told by your health care provider. This is important. Contact a health care provider if: You have new symptoms. You have unexplained weight loss. You have difficulty swallowing, or it hurts to swallow. You have wheezing or a persistent cough. Your symptoms do not improve with treatment. You have frequent heartburn for more than two weeks. Get help right away if: You have severe pain in your arms, neck, jaw, teeth, or back. You feel sweaty, dizzy, or light-headed. You have chest pain or shortness of breath. You vomit and your vomit looks like blood or coffee grounds. Your stool is bloody or black. You have a fever. You cannot swallow, drink, or eat.

## 2016-08-23 ENCOUNTER — Encounter (HOSPITAL_COMMUNITY): Payer: Self-pay

## 2016-08-23 ENCOUNTER — Emergency Department (HOSPITAL_COMMUNITY)
Admission: EM | Admit: 2016-08-23 | Discharge: 2016-08-23 | Disposition: A | Discharge status: Home / Self Care | Payer: Medicaid Other | Attending: Emergency Medicine | Admitting: Emergency Medicine

## 2016-08-23 ENCOUNTER — Emergency Department (HOSPITAL_COMMUNITY): Payer: Medicaid Other

## 2016-08-23 DIAGNOSIS — I10 Essential (primary) hypertension: Secondary | ICD-10-CM | POA: Insufficient documentation

## 2016-08-23 DIAGNOSIS — Z79899 Other long term (current) drug therapy: Secondary | ICD-10-CM | POA: Insufficient documentation

## 2016-08-23 DIAGNOSIS — F439 Reaction to severe stress, unspecified: Secondary | ICD-10-CM | POA: Diagnosis present

## 2016-08-23 DIAGNOSIS — G4700 Insomnia, unspecified: Secondary | ICD-10-CM | POA: Insufficient documentation

## 2016-08-23 DIAGNOSIS — R51 Headache: Secondary | ICD-10-CM | POA: Diagnosis not present

## 2016-08-23 DIAGNOSIS — F419 Anxiety disorder, unspecified: Secondary | ICD-10-CM | POA: Insufficient documentation

## 2016-08-23 DIAGNOSIS — Z87891 Personal history of nicotine dependence: Secondary | ICD-10-CM | POA: Insufficient documentation

## 2016-08-23 LAB — COMPREHENSIVE METABOLIC PANEL
ALBUMIN: 4.5 g/dL (ref 3.5–5.0)
ALK PHOS: 93 U/L (ref 38–126)
ALT: 35 U/L (ref 17–63)
ANION GAP: 11 (ref 5–15)
AST: 30 U/L (ref 15–41)
BILIRUBIN TOTAL: 0.7 mg/dL (ref 0.3–1.2)
BUN: 7 mg/dL (ref 6–20)
CHLORIDE: 89 mmol/L — AB (ref 101–111)
CO2: 27 mmol/L (ref 22–32)
Calcium: 9.4 mg/dL (ref 8.9–10.3)
Creatinine, Ser: 0.71 mg/dL (ref 0.61–1.24)
GFR calc Af Amer: >60 mL/min (ref >60)
GFR calc non Af Amer: >60 mL/min (ref >60)
GLUCOSE: 124 mg/dL — AB (ref 65–99)
POTASSIUM: 4.1 mmol/L (ref 3.5–5.1)
SODIUM: 127 mmol/L — AB (ref 135–145)
TOTAL PROTEIN: 7.9 g/dL (ref 6.5–8.1)

## 2016-08-23 LAB — CBC WITH DIFFERENTIAL/PLATELET
BASOS PCT: 0 %
Basophils Absolute: 0 10*3/uL (ref 0.0–0.1)
Eosinophils Absolute: 0.1 10*3/uL (ref 0.0–0.7)
Eosinophils Relative: 1 %
HCT: 37.6 % — ABNORMAL LOW (ref 39.0–52.0)
HEMOGLOBIN: 12.9 g/dL — AB (ref 13.0–17.0)
Lymphocytes Relative: 7 %
Lymphs Abs: 0.9 10*3/uL (ref 0.7–4.0)
MCH: 29.9 pg (ref 26.0–34.0)
MCHC: 34.3 g/dL (ref 30.0–36.0)
MCV: 87 fL (ref 78.0–100.0)
MONOS PCT: 8 %
Monocytes Absolute: 1 10*3/uL (ref 0.1–1.0)
NEUTROS ABS: 10.4 10*3/uL — AB (ref 1.7–7.7)
NEUTROS PCT: 84 %
Platelets: 350 10*3/uL (ref 150–400)
RBC: 4.32 MIL/uL (ref 4.22–5.81)
RDW: 12.6 % (ref 11.5–15.5)
WBC: 12.4 10*3/uL — AB (ref 4.0–10.5)

## 2016-08-23 MED ORDER — SODIUM CHLORIDE 0.9 % IV BOLUS (SEPSIS)
1000.0000 mL | Freq: Once | INTRAVENOUS | Status: AC
  Administered 2016-08-23: 1000 mL via INTRAVENOUS

## 2016-08-23 MED ORDER — METOCLOPRAMIDE HCL 5 MG/ML IJ SOLN
10.0000 mg | Freq: Once | INTRAMUSCULAR | Status: AC
  Administered 2016-08-23: 10 mg via INTRAVENOUS
  Filled 2016-08-23: qty 2

## 2016-08-23 MED ORDER — LORAZEPAM 1 MG PO TABS
2.0000 mg | ORAL_TABLET | Freq: Once | ORAL | Status: AC
  Administered 2016-08-23: 2 mg via ORAL
  Filled 2016-08-23: qty 2

## 2016-08-23 MED ORDER — ZOLPIDEM TARTRATE 10 MG PO TABS
10.0000 mg | ORAL_TABLET | Freq: Once | ORAL | Status: AC
Start: 2016-08-23 — End: 2016-08-23
  Administered 2016-08-23: 10 mg via ORAL
  Filled 2016-08-23: qty 1

## 2016-08-23 NOTE — ED Triage Notes (Signed)
Pt c/o headache and n/v r/t increased stress x 2 days.  Pain score 8/10.  Sts some blurred vision and light sensitivity.  Pt reports taking tylenol w/o relief.  Pt reports "they are getting ready to shut off my gas, foreclose my house, and my dad is getting ready to die."  Pt reports he is supposed to be seeing a GI MD for esophagitis, but has not followed up.   Significant GI Hx.

## 2016-08-23 NOTE — ED Provider Notes (Signed)
[place] DEPT Provider Note   CSN: [id number] Arrival date & time: 08/23/16  1009     History   Chief Complaint Chief Complaint  Patient presents with  . Stress  . Headache  . Emesis    HPI Anthony Lamb is a 61 y.o. male.  Patient is a 61 year old male with chronic alcohol abuse which is in remission, chronic pain, depression, esophagitis, hypertension who is presenting today for headache, shakiness, anxiety, insomnia. Patient states he ran out of his Ambien several days ago as well as his Ativan which he typically takes 4 times a day and now he is unable to sleep. He has had a significant amount of stress in his life as they are going to turn off the gas to his house and for close on his home. He feels hopeless and doesn't know what to do. Currently there is a lady trying to help him. He states this morning the headache was worse. He describes it as throughout his head with some may be minimal blurry vision. He denies any unilateral weakness or numbness or speech difficulty. Patient also states that he has a known esophagitis and has had ongoing issues. He does take sucralfate and Protonix but feels like the stress is making everything worse. He tried to drink 2 beers last night to sleep but has still been unable to sleep for the last 2 days because he has not had his Ambien. He does have a new prescription for the Ambien which he can pick up today but states he is not allowed to get his lorazepam and so the 29th. He states he takes it as prescribed but he thinks people are stealing his medication. He denies any suicidal ideations. No vomiting, fever, abdominal pain, chest pain or shortness of breath.   The history is provided by the patient.  Headache   This is a new problem. The current episode started yesterday. The problem occurs constantly. The problem has not changed since onset.The headache is associated with nothing. The pain is at a severity of 5/10. The pain is  moderate. Associated symptoms include anorexia, nausea and vomiting. Pertinent negatives include no fever, no palpitations, no syncope and no shortness of breath. He has tried nothing for the symptoms. The treatment provided no relief.  Emesis   Associated symptoms include headaches. Pertinent negatives include no fever.    Past Medical History:  Diagnosis Date  . Alcohol abuse, in remission   . Anemia   . Anxiety   . Chronic pain   . Colitis   . Depression   . Esophagitis   . Gastritis   . GERD (gastroesophageal reflux disease)   . Hypertension   . IBS (irritable bowel syndrome)   . Poor dentition     Patient Active Problem List   Diagnosis Date Noted  . Osteopenia determined by x-ray 08/06/2014  . Stress fracture of calcaneus 08/06/2014  . Vitamin D deficiency 12/18/2013  . Dementia 12/18/2013  . Benign microscopic hematuria 07/06/2013  . SIADH (syndrome of inappropriate ADH production) ([place]) 06/22/2013  . Ankylosing spondylitis ([place]) 06/20/2013  . Malnutrition of moderate degree ([place]) 06/20/2013  . Depression with anxiety 05/31/2013  . BPH (benign prostatic hyperplasia) 08/22/2010  . Backache 04/19/2010  . ALCOHOLISM 08/13/2008  . History of colonic polyps 08/13/2008  . Essential hypertension 07/03/2007  . PUD (peptic ulcer disease) 07/03/2007  . Irritable bowel syndrome 07/03/2007    Past Surgical History:  Procedure Laterality Date  . COLONOSCOPY W/ BIOPSIES    .  ESOPHAGOGASTRODUODENOSCOPY    . HERNIA REPAIR Bilateral   . TONSILLECTOMY         Home Medications    Prior to Admission medications   Medication Sig Start Date End Date Taking? Authorizing Provider  hydrOXYzine (VISTARIL) 25 MG capsule Take 25 mg by mouth 3 (three) times daily as needed for anxiety.   Yes Historical Provider, MD  lisinopril-hydrochlorothiazide (PRINZIDE,ZESTORETIC) 10-12.5 MG tablet Take 1 tablet by mouth at bedtime 07/25/15  Yes Historical Provider, MD  LORazepam (ATIVAN) 0.5  MG tablet Take 1 tablet (0.5 mg total) by mouth 3 (three) times daily as needed for anxiety or sleep. 06/20/16  Yes Shawn C Joy, PA-C  pantoprazole (PROTONIX) 40 MG tablet Take 1 tablet (40 mg total) by mouth daily. 04/24/14  Yes Janith Lima, MD  sucralfate (CARAFATE) 1 G tablet Take 1 tablet (1 g total) by mouth 4 (four) times daily -  with meals and at bedtime. Chew before swallowing 12/17/13  Yes Janith Lima, MD  traMADol (ULTRAM) 50 MG tablet Take 1 tablet (50 mg total) by mouth every 8 (eight) hours as needed. 12/23/14  Yes Lacretia Leigh, MD  triamcinolone (KENALOG) 0.025 % cream Apply 1 application topically 2 (two) times daily as needed for rash. 08/15/16  Yes Historical Provider, MD  verapamil (CALAN-SR) 240 MG CR tablet Take one tablet by mouth at bedtime 08/25/14  Yes Historical Provider, MD  zolpidem (AMBIEN) 10 MG tablet Take 10 mg by mouth at bedtime. 08/10/16  Yes Historical Provider, MD  amoxicillin-clavulanate (AUGMENTIN) 875-125 MG tablet Take 1 tablet by mouth every 12 (twelve) hours. Patient not taking: Reported on 06/20/2016 08/13/15   Olivia Canter Sam, PA-C  clindamycin (CLEOCIN) 150 MG capsule Take 3 capsules (450 mg total) by mouth 3 (three) times daily. Patient not taking: Reported on 06/20/2016 03/18/16   Okey Regal, PA-C  fluticasone [facility]) 50 MCG/ACT nasal spray Place 2 sprays into both nostrils daily. Patient not taking: Reported on 08/23/2016 10/14/14   Golden Circle, FNP  hydrOXYzine (ATARAX/VISTARIL) 50 MG tablet TAKE 2 TABLETS BY MOUTH THREE TIMES DAILY. MUST SEE NEW DOCTOR FOR FUTURE REFILLS. Patient not taking: Reported on 08/13/2015 11/19/14   Janith Lima, MD  ondansetron (ZOFRAN ODT) 4 MG disintegrating tablet Take 1 tablet (4 mg total) by mouth every 8 (eight) hours as needed for nausea or vomiting. Patient not taking: Reported on 08/23/2016 06/20/16   Lorayne Bender, PA-C  Vitamin D, Ergocalciferol, (DRISDOL) 50000 UNITS CAPS capsule TAKE 1 CAPSULE BY MOUTH ONCE A  WEEK Patient not taking: Reported on 06/20/2016 11/19/14   Janith Lima, MD    Family History Family History  Problem Relation Age of Onset  . Anxiety disorder Mother   . Congestive Heart Failure Mother   . Crohn's disease Mother   . Arthritis Father   . High blood pressure Father     Social History Social History  Substance Use Topics  . Smoking status: Former Smoker    Types: Cigarettes  . Smokeless tobacco: Never Used  . Alcohol use No     Allergies   Diphenhydramine hcl; Flexeril [cyclobenzaprine]; and Nsaids   Review of Systems Review of Systems  Constitutional: Negative for fever.  Respiratory: Negative for shortness of breath.   Cardiovascular: Negative for palpitations and syncope.  Gastrointestinal: Positive for anorexia, nausea and vomiting.  Neurological: Positive for headaches.  All other systems reviewed and are negative.    Physical Exam Updated Vital Signs BP Marland Kitchen)  174/103 (BP Location: Left Arm)   Pulse 77   Temp 98 F (36.7 C) (Oral)   Resp 16   SpO2 100%   Physical Exam  Constitutional: He is oriented to person, place, and time. He appears well-developed and well-nourished.  anxious  HENT:  Head: Normocephalic and atraumatic.  Mouth/Throat: Oropharynx is clear and moist.  Poor dentition with multiple dental caries involving all teeth but no localized gum swelling or source of infection.  Eyes: Conjunctivae and EOM are normal. Pupils are equal, round, and reactive to light.  Neck: Normal range of motion. Neck supple.  Cardiovascular: Normal rate, regular rhythm and intact distal pulses.   No murmur heard. Pulmonary/Chest: Effort normal and breath sounds normal. No respiratory distress. He has no wheezes. He has no rales.  Abdominal: Soft. He exhibits no distension. There is no tenderness. There is no rebound and no guarding.  Musculoskeletal: Normal range of motion. He exhibits no edema or tenderness.  Mild muscle twitches  Neurological:  He is alert and oriented to person, place, and time. He has normal strength. No sensory deficit. Gait normal.  Skin: Skin is warm and dry. No rash noted. No erythema.  Psychiatric: He has a normal mood and affect. His behavior is normal.  Nursing note and vitals reviewed.    ED Treatments / Results  Labs (all labs ordered are listed, but only abnormal results are displayed) Labs Reviewed  CBC WITH DIFFERENTIAL/PLATELET - Abnormal; Notable for the following:       Result Value   WBC 12.4 (*)    Hemoglobin 12.9 (*)    HCT 37.6 (*)    Neutro Abs 10.4 (*)    All other components within normal limits  COMPREHENSIVE METABOLIC PANEL - Abnormal; Notable for the following:    Sodium 127 (*)    Chloride 89 (*)    Glucose, Bld 124 (*)    All other components within normal limits    EKG  EKG Interpretation None       Radiology Ct Head Wo Contrast  Result Date: 08/23/2016 CLINICAL DATA:  Headache for 3 month EXAM: CT HEAD WITHOUT CONTRAST TECHNIQUE: Contiguous axial images were obtained from the base of the skull through the vertex without intravenous contrast. COMPARISON:  06/23/2013 FINDINGS: Brain: Mild global atrophy. No mass effect, midline shift, or acute hemorrhage. Vascular: No hyperdense vessel or unexpected calcification. Skull: Cranium is intact Sinuses/Orbits: Mastoid air cells and visualized paranasal sinuses are clear. Unremarkable orbits. Other: None. IMPRESSION: No acute intracranial pathology. Electronically Signed   By: Marybelle Killings M.D.   On: 08/23/2016 12:16    Procedures Procedures (including critical care time)  Medications Ordered in ED Medications  sodium chloride 0.9 % bolus 1,000 mL (1,000 mLs Intravenous New Bag/Given 08/23/16 1104)  metoCLOPramide (REGLAN) injection 10 mg (10 mg Intravenous Given 08/23/16 1104)  LORazepam (ATIVAN) tablet 2 mg (2 mg Oral Given 08/23/16 1104)     Initial Impression / Assessment and Plan / ED Course  I have reviewed the  triage vital signs and the nursing notes.  Pertinent labs & imaging results that were available during my care of the patient were reviewed by me and considered in my medical decision making (see chart for details).    Patient presenting today with numerous complaints but most of them seem to be related to anxiety and stress. Patient also is unsure if he took his blood pressure medication last night. Patient is hypertensive today and also complaining of a headache  however has no focal neurologic findings. Been out of his Ativan and lorazepam has not slept in 2 days which is most likely the cause of some of his symptoms. Patient did drink 2 beers last night but denies heavy alcohol use. He is taking Protonix and sucralfate. Temperature, heart rate and oxygen saturation are within normal limits. We'll continue to monitor blood pressure. Patient will be given IV fluids, Reglan for headache and Ativan. CBC with a mild cytosis but unchanged from prior. Stable hemoglobin. CMP pending.  1:55 PM Labs without acute issues.  Pt feeling better.  He is eating and repeat BP 141/73.  Final Clinical Impressions(s) / ED Diagnoses   Final diagnoses:  Hypertension, unspecified type  Anxiety  Insomnia, unspecified type    New Prescriptions New Prescriptions   No medications on file     Blanchie Dessert, MD 08/23/16 1400

## 2016-09-09 ENCOUNTER — Emergency Department (HOSPITAL_COMMUNITY): Payer: Medicaid Other

## 2016-09-09 ENCOUNTER — Emergency Department (HOSPITAL_COMMUNITY)
Admission: EM | Admit: 2016-09-09 | Discharge: 2016-09-09 | Disposition: A | Discharge status: Home / Self Care | Payer: Medicaid Other | Attending: Emergency Medicine | Admitting: Emergency Medicine

## 2016-09-09 ENCOUNTER — Encounter (HOSPITAL_COMMUNITY): Payer: Self-pay | Admitting: *Deleted

## 2016-09-09 DIAGNOSIS — R197 Diarrhea, unspecified: Secondary | ICD-10-CM | POA: Diagnosis not present

## 2016-09-09 DIAGNOSIS — R112 Nausea with vomiting, unspecified: Secondary | ICD-10-CM | POA: Insufficient documentation

## 2016-09-09 DIAGNOSIS — F419 Anxiety disorder, unspecified: Secondary | ICD-10-CM | POA: Diagnosis present

## 2016-09-09 DIAGNOSIS — Z79899 Other long term (current) drug therapy: Secondary | ICD-10-CM | POA: Insufficient documentation

## 2016-09-09 DIAGNOSIS — I10 Essential (primary) hypertension: Secondary | ICD-10-CM | POA: Diagnosis not present

## 2016-09-09 DIAGNOSIS — Z87891 Personal history of nicotine dependence: Secondary | ICD-10-CM | POA: Diagnosis not present

## 2016-09-09 LAB — CBC WITH DIFFERENTIAL/PLATELET
BASOS ABS: 0.1 10*3/uL (ref 0.0–0.1)
BASOS PCT: 0 %
EOS ABS: 0.1 10*3/uL (ref 0.0–0.7)
Eosinophils Relative: 1 %
HCT: 38.9 % — ABNORMAL LOW (ref 39.0–52.0)
HEMOGLOBIN: 13.6 g/dL (ref 13.0–17.0)
Lymphocytes Relative: 13 %
Lymphs Abs: 1.7 10*3/uL (ref 0.7–4.0)
MCH: 30.7 pg (ref 26.0–34.0)
MCHC: 35 g/dL (ref 30.0–36.0)
MCV: 87.8 fL (ref 78.0–100.0)
Monocytes Absolute: 1.2 10*3/uL — ABNORMAL HIGH (ref 0.1–1.0)
Monocytes Relative: 9 %
NEUTROS PCT: 77 %
Neutro Abs: 10.3 10*3/uL — ABNORMAL HIGH (ref 1.7–7.7)
PLATELETS: 415 10*3/uL — AB (ref 150–400)
RBC: 4.43 MIL/uL (ref 4.22–5.81)
RDW: 12.3 % (ref 11.5–15.5)
WBC: 13.3 10*3/uL — AB (ref 4.0–10.5)

## 2016-09-09 LAB — LIPASE, BLOOD: Lipase: 22 U/L (ref 11–51)

## 2016-09-09 LAB — COMPREHENSIVE METABOLIC PANEL
ALBUMIN: 4.2 g/dL (ref 3.5–5.0)
ALK PHOS: 85 U/L (ref 38–126)
ALT: 28 U/L (ref 17–63)
AST: 28 U/L (ref 15–41)
Anion gap: 9 (ref 5–15)
BUN: 6 mg/dL (ref 6–20)
CALCIUM: 9.3 mg/dL (ref 8.9–10.3)
CHLORIDE: 93 mmol/L — AB (ref 101–111)
CO2: 26 mmol/L (ref 22–32)
CREATININE: 0.74 mg/dL (ref 0.61–1.24)
GFR calc Af Amer: >60 mL/min (ref >60)
GFR calc non Af Amer: >60 mL/min (ref >60)
GLUCOSE: 94 mg/dL (ref 65–99)
Potassium: 4.2 mmol/L (ref 3.5–5.1)
SODIUM: 128 mmol/L — AB (ref 135–145)
Total Bilirubin: 0.6 mg/dL (ref 0.3–1.2)
Total Protein: 7.6 g/dL (ref 6.5–8.1)

## 2016-09-09 MED ORDER — CHLORDIAZEPOXIDE HCL 25 MG PO CAPS
25.0000 mg | ORAL_CAPSULE | Freq: Once | ORAL | Status: AC
  Administered 2016-09-09: 25 mg via ORAL
  Filled 2016-09-09: qty 1

## 2016-09-09 MED ORDER — ONDANSETRON HCL 4 MG/2ML IJ SOLN
4.0000 mg | Freq: Once | INTRAMUSCULAR | Status: AC
  Administered 2016-09-09: 4 mg via INTRAVENOUS
  Filled 2016-09-09: qty 2

## 2016-09-09 MED ORDER — HYDROXYZINE HCL 25 MG PO TABS: 25.0000 mg | ORAL_TABLET | Freq: Four times a day (QID) | ORAL | 0 refills | Status: DC

## 2016-09-09 MED ORDER — PROMETHAZINE HCL 25 MG PO TABS: 25.0000 mg | ORAL_TABLET | Freq: Four times a day (QID) | ORAL | 0 refills | Status: DC | PRN

## 2016-09-09 MED ORDER — GI COCKTAIL ~~LOC~~
30.0000 mL | Freq: Once | ORAL | Status: AC
  Administered 2016-09-09: 30 mL via ORAL
  Filled 2016-09-09: qty 30

## 2016-09-09 MED ORDER — SODIUM CHLORIDE 0.9 % IV BOLUS (SEPSIS)
1000.0000 mL | Freq: Once | INTRAVENOUS | Status: AC
  Administered 2016-09-09: 1000 mL via INTRAVENOUS

## 2016-09-09 MED ORDER — DIAZEPAM 5 MG PO TABS: 5.0000 mg | ORAL_TABLET | Freq: Three times a day (TID) | ORAL | 0 refills | Status: DC | PRN

## 2016-09-09 MED ORDER — ONDANSETRON 4 MG PO TBDP: 4.0000 mg | ORAL_TABLET | Freq: Three times a day (TID) | ORAL | 0 refills | Status: DC | PRN

## 2016-09-09 MED ORDER — PROMETHAZINE HCL 25 MG RE SUPP: 25.0000 mg | Freq: Four times a day (QID) | RECTAL | 0 refills | Status: DC | PRN
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
## 2016-09-09 NOTE — ED Notes (Signed)
Patient transported to X-ray 
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
## 2016-09-25 ENCOUNTER — Encounter (HOSPITAL_COMMUNITY): Payer: Self-pay | Admitting: Emergency Medicine

## 2016-09-25 ENCOUNTER — Emergency Department (HOSPITAL_COMMUNITY)
Admission: EM | Admit: 2016-09-25 | Discharge: 2016-09-25 | Disposition: A | Discharge status: Home / Self Care | Payer: Medicaid Other | Attending: Emergency Medicine | Admitting: Emergency Medicine

## 2016-09-25 DIAGNOSIS — K029 Dental caries, unspecified: Secondary | ICD-10-CM | POA: Insufficient documentation

## 2016-09-25 DIAGNOSIS — I10 Essential (primary) hypertension: Secondary | ICD-10-CM | POA: Diagnosis not present

## 2016-09-25 DIAGNOSIS — K047 Periapical abscess without sinus: Secondary | ICD-10-CM | POA: Diagnosis not present

## 2016-09-25 DIAGNOSIS — Z79899 Other long term (current) drug therapy: Secondary | ICD-10-CM | POA: Diagnosis not present

## 2016-09-25 DIAGNOSIS — K0889 Other specified disorders of teeth and supporting structures: Secondary | ICD-10-CM

## 2016-09-25 DIAGNOSIS — Z87891 Personal history of nicotine dependence: Secondary | ICD-10-CM | POA: Insufficient documentation

## 2016-09-25 MED ORDER — PENICILLIN V POTASSIUM 500 MG PO TABS
500.0000 mg | ORAL_TABLET | Freq: Once | ORAL | Status: AC
  Administered 2016-09-25: 500 mg via ORAL
  Filled 2016-09-25: qty 1

## 2016-09-25 MED ORDER — PENICILLIN V POTASSIUM 500 MG PO TABS: 500.0000 mg | ORAL_TABLET | Freq: Four times a day (QID) | ORAL | 0 refills | Status: AC

## 2016-09-25 MED ORDER — BENZOCAINE 20 % MT AERO
INHALATION_SPRAY | Freq: Once | OROMUCOSAL | Status: AC
Start: 2016-09-25 — End: 2016-09-25
  Administered 2016-09-25: 1 via OROMUCOSAL
  Filled 2016-09-25: qty 57

## 2016-09-25 NOTE — ED Triage Notes (Signed)
Patient reports generalized dental/mouth pain due to some "bad teeth" for a few months. Patient states he does not have a regular dentist. Patient has not been to dentist in 8 years.

## 2016-09-25 NOTE — Discharge Instructions (Signed)
Please read instructions below. Take your antibiotic four times per day until they are gone. You can take tylenol as needed for pain. Call the oral surgeon's office, Dr. Hoyt Koch, for consult and follow up on your dental problem. Return to the ER for fever, difficulty swallowing or breathing.

## 2016-09-25 NOTE — ED Provider Notes (Signed)
[facility] DEPT Provider Note   By signing my name below, I, Bea Graff, attest that this documentation has been prepared under the direction and in the presence of Martinique Russo, PA-C. Electronically Signed: Bea Graff, ED Scribe. 09/25/16. 5:46 PM.    History   Chief Complaint Chief Complaint  Patient presents with  . Dental Pain   The history is provided by the patient and medical records. No language interpreter was used.    Anthony Lamb is a 61 y.o. male with PMHx of ETOH abuse, chronic pain, HTN who presents to the Emergency Department complaining of diffuse dental pain that has been ongoing for the past few months. He reports associated chills and swelling of the surrounding gingiva. He rates his pain at 8/10. He states he was scheduled to have the teeth removed by an oral surgeon on two separate occasions 3 and 4 months ago but he has not able to go and they will not reschedule now. He has not taken anything for pain. He denies modifying factors. He denies fever, chills, drooling, difficulty swallowing or breathing. He does not have a regular dentist.   Past Medical History:  Diagnosis Date  . Alcohol abuse, in remission   . Anemia   . Anxiety   . Chronic pain   . Colitis   . Depression   . Esophagitis   . Gastritis   . GERD (gastroesophageal reflux disease)   . Hypertension   . IBS (irritable bowel syndrome)   . Poor dentition     Patient Active Problem List   Diagnosis Date Noted  . Osteopenia determined by x-ray 08/06/2014  . Stress fracture of calcaneus 08/06/2014  . Vitamin D deficiency 12/18/2013  . Dementia 12/18/2013  . Benign microscopic hematuria 07/06/2013  . 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 Ankylosing spondylitis ([place]) 06/20/2013  . Malnutrition of moderate degree ([place]) 06/20/2013  . Depression with anxiety 05/31/2013  . BPH (benign prostatic hyperplasia) 08/22/2010  . Backache 04/19/2010  .  ALCOHOLISM 08/13/2008  . History of colonic polyps 08/13/2008  . Essential hypertension 07/03/2007  . PUD (peptic ulcer disease) 07/03/2007  . Irritable bowel syndrome 07/03/2007    Past Surgical History:  Procedure Laterality Date  . COLONOSCOPY W/ BIOPSIES    . ESOPHAGOGASTRODUODENOSCOPY    . HERNIA REPAIR Bilateral   . TONSILLECTOMY         Home Medications    Prior to Admission medications   Medication Sig Start Date End Date Taking? Authorizing Provider  acetaminophen (TYLENOL) 500 MG tablet Take 1,000 mg by mouth every 6 (six) hours as needed.    Historical Provider, MD  Cholecalciferol (VITAMIN D) 2000 units CAPS Take 4,000 Units by mouth daily.     Historical Provider, MD  diazepam (VALIUM) 5 MG tablet Take 1 tablet (5 mg total) by mouth every 8 (eight) hours as needed for anxiety or sedation. 09/09/16   Gareth Morgan, MD  fluticasone (FLONASE) 50 MCG/ACT nasal spray Place 1 spray into both nostrils daily. 09/05/16   Historical Provider, MD  hydrOXYzine (ATARAX/VISTARIL) 25 MG tablet Take 1 tablet (25 mg total) by mouth every 6 (six) hours. 09/09/16   Gareth Morgan, MD  hydrOXYzine (VISTARIL) 25 MG capsule Take 25 mg by mouth 3 (three) times daily as needed for anxiety.    Historical Provider, MD  lisinopril (PRINIVIL,ZESTRIL) 10 MG tablet Take 10 mg by mouth daily. 09/05/16   Historical Provider, MD  LORazepam (ATIVAN) 0.5 MG tablet Take  1 tablet (0.5 mg total) by mouth 3 (three) times daily as needed for anxiety or sleep. 06/20/16   Shawn C Joy, PA-C  ondansetron (ZOFRAN ODT) 4 MG disintegrating tablet Take 1 tablet (4 mg total) by mouth every 8 (eight) hours as needed for nausea or vomiting. 09/09/16   Gareth Morgan, MD  pantoprazole (PROTONIX) 40 MG tablet Take 1 tablet (40 mg total) by mouth daily. 04/24/14   Janith Lima, MD  penicillin v potassium (VEETID) 500 MG tablet Take 1 tablet (500 mg total) by mouth 4 (four) times daily. 09/25/16 10/02/16  Martinique N Russo, PA-C    promethazine (PHENERGAN) 25 MG suppository Place 1 suppository (25 mg total) rectally every 6 (six) hours as needed for nausea or vomiting. 09/09/16   Gareth Morgan, MD  promethazine (PHENERGAN) 25 MG tablet Take 1 tablet (25 mg total) by mouth every 6 (six) hours as needed for nausea or vomiting. 09/09/16   Gareth Morgan, MD  sucralfate (CARAFATE) 1 G tablet Take 1 tablet (1 g total) by mouth 4 (four) times daily -  with meals and at bedtime. Chew before swallowing 12/17/13   Janith Lima, MD  traMADol (ULTRAM) 50 MG tablet Take 1 tablet (50 mg total) by mouth every 8 (eight) hours as needed. Patient taking differently: Take 50 mg by mouth every 8 (eight) hours as needed (pain).  12/23/14   Lacretia Leigh, MD  triamcinolone (KENALOG) 0.025 % cream Apply 1 application topically 2 (two) times daily as needed for rash. 08/15/16   Historical Provider, MD  verapamil (CALAN-SR) 240 MG CR tablet Take one tablet by mouth at bedtime 08/25/14   Historical Provider, MD  zolpidem (AMBIEN) 10 MG tablet Take 10 mg by mouth at bedtime. 08/10/16   Historical Provider, MD    Family History Family History  Problem Relation Age of Onset  . Anxiety disorder Mother   . Congestive Heart Failure Mother   . Crohn's disease Mother   . Arthritis Father   . High blood pressure Father     Social History Social History  Substance Use Topics  . Smoking status: Former Smoker    Types: Cigarettes  . Smokeless tobacco: Never Used  . Alcohol use No     Allergies   Diphenhydramine hcl; Flexeril [cyclobenzaprine]; and Nsaids   Review of Systems Review of Systems  Constitutional: Positive for chills. Negative for fever.  HENT: Positive for dental problem. Negative for drooling and trouble swallowing.   Respiratory: Negative for shortness of breath.      Physical Exam Updated Vital Signs BP (!) 158/73 (BP Location: Left Arm)   Pulse 83   Temp 98.1 F (36.7 C) (Oral)   Resp 18   Ht 5' 11"  (1.803 m)   Wt 195  lb (88.5 kg)   SpO2 97%   BMI 27.20 kg/m   Physical Exam  Constitutional: He appears well-developed and well-nourished.  HENT:  Head: Normocephalic and atraumatic.  Mouth/Throat: Uvula is midline, oropharynx is clear and moist and mucous membranes are normal. Abnormal dentition. Dental caries present.  Severely poor dentition. Remaining teeth are broken. Diffuse gingivitis. 1cm fluctuant abscess on R lower marginal gingiva.  Eyes: Conjunctivae are normal.  Cardiovascular: Normal rate.   Pulmonary/Chest: Effort normal. No respiratory distress.  Lymphadenopathy:    He has no cervical adenopathy.  Psychiatric: He has a normal mood and affect. His behavior is normal.  Nursing note and vitals reviewed.    ED Treatments / Results  DIAGNOSTIC  STUDIES: Oxygen Saturation is 97% on RA, normal by my interpretation.   COORDINATION OF CARE: 5:08 PM- Will prescribe antibiotic and have Dr. Lita Mains examine patient. Pt verbalizes understanding and agrees to plan.  5:45 PM- Dr. Lita Mains will drain periapical abscess prior to discharge.   Medications  Benzocaine (HURRCAINE) 20 % mouth spray (1 application Mouth/Throat Given 09/25/16 1806)  penicillin v potassium (VEETID) tablet 500 mg (500 mg Oral Given 09/25/16 1911)    Labs (all labs ordered are listed, but only abnormal results are displayed) Labs Reviewed - No data to display  EKG  EKG Interpretation None       Radiology No results found.  Procedures Procedures (including critical care time)  Medications Ordered in ED Medications  Benzocaine (HURRCAINE) 20 % mouth spray (1 application Mouth/Throat Given 09/25/16 1806)  penicillin v potassium (VEETID) tablet 500 mg (500 mg Oral Given 09/25/16 1911)     Initial Impression / Assessment and Plan / ED Course  I have reviewed the triage vital signs and the nursing notes.  Pertinent labs & imaging results that were available during my care of the patient were reviewed by me  and considered in my medical decision making (see chart for details).     Patient with severely poor dentition and dental abscess amenable to incision and drainage. I&D performed by Dr. Lita Mains, see his procedure note. Oral surgeon follow up and wound recheck in 2 days. Will d/c to home.  No antibiotic therapy is indicated. Exam unconcerning for Ludwig's angina or spread of infection.  Will treat with penicillin and pain medicine. Pt afebrile, safe for discharge.  Patient discussed with and seen by Dr. Lita Mains.  Discussed results, findings, treatment and follow up. Patient advised of return precautions. Patient verbalized understanding and agreed with plan.  Final Clinical Impressions(s) / ED Diagnoses   Final diagnoses:  Dental abscess  Dental caries  Pain, dental    New Prescriptions Discharge Medication List as of 09/25/2016  7:26 PM    START taking these medications   Details  penicillin v potassium (VEETID) 500 MG tablet Take 1 tablet (500 mg total) by mouth 4 (four) times daily., Starting Mon 09/25/2016, Until Mon 10/02/2016, Print        I personally performed the services described in this documentation, which was scribed in my presence. The recorded information has been reviewed and is accurate.     Martinique N Russo, PA-C 09/25/16 1952    Julianne Rice, MD 09/26/16 [id number]

## 2016-10-10 ENCOUNTER — Encounter (HOSPITAL_COMMUNITY): Payer: Self-pay | Admitting: Emergency Medicine

## 2016-10-10 ENCOUNTER — Emergency Department (HOSPITAL_COMMUNITY)
Admission: EM | Admit: 2016-10-10 | Discharge: 2016-10-10 | Disposition: A | Discharge status: Home / Self Care | Payer: Medicaid Other | Attending: Emergency Medicine | Admitting: Emergency Medicine

## 2016-10-10 DIAGNOSIS — Z79899 Other long term (current) drug therapy: Secondary | ICD-10-CM | POA: Diagnosis not present

## 2016-10-10 DIAGNOSIS — K029 Dental caries, unspecified: Secondary | ICD-10-CM | POA: Insufficient documentation

## 2016-10-10 DIAGNOSIS — Z87891 Personal history of nicotine dependence: Secondary | ICD-10-CM | POA: Diagnosis not present

## 2016-10-10 DIAGNOSIS — K0889 Other specified disorders of teeth and supporting structures: Secondary | ICD-10-CM | POA: Diagnosis present

## 2016-10-10 DIAGNOSIS — I10 Essential (primary) hypertension: Secondary | ICD-10-CM | POA: Insufficient documentation

## 2016-10-10 MED ORDER — CLINDAMYCIN HCL 300 MG PO CAPS: 300.0000 mg | ORAL_CAPSULE | Freq: Four times a day (QID) | ORAL | 0 refills | Status: DC

## 2016-10-10 MED ORDER — KETOROLAC TROMETHAMINE 60 MG/2ML IM SOLN
60.0000 mg | Freq: Once | INTRAMUSCULAR | Status: AC
  Administered 2016-10-10: 60 mg via INTRAMUSCULAR
  Filled 2016-10-10: qty 2

## 2016-10-10 MED ORDER — TRAMADOL HCL 50 MG PO TABS: 50.0000 mg | ORAL_TABLET | Freq: Four times a day (QID) | ORAL | 0 refills | Status: DC | PRN

## 2016-10-10 MED ORDER — LIDOCAINE VISCOUS 2 % MT SOLN
20.0000 mL | OROMUCOSAL | 0 refills | Status: DC | PRN
Start: 2016-10-10 — End: 2016-11-08

## 2016-10-10 NOTE — ED Triage Notes (Signed)
Pt reports chronic mouth pain . Has not been able to follow up with oral surgeon.

## 2016-10-10 NOTE — ED Provider Notes (Signed)
[facility] DEPT Provider Note   CSN: [id number] Arrival date & time: 10/10/16  1359   By signing my name below, I, Soijett Blue, attest that this documentation has been prepared under the direction and in the presence of Clayton Bibles, PA-C Electronically Signed: Soijett Blue, ED Scribe. 10/10/16. 3:25 PM.  History   Chief Complaint Chief Complaint  Patient presents with  . Dental Pain    HPI Anthony Lamb is a 61 y.o. male with a PMHx of HTN, who presents to the Emergency Department complaining of chronic dental pain onset several months-years. Pt reports associated chills and sore throat. Pt has not tried any medications for the relief of his symptoms. He notes that he is supposed to have his teeth removed, but has been unable to follow up with the oral surgeon. Pt reports that he has been evaluated in the ED with an I&D for dental abscess. Denies fever, body aches, difficulty breathing, and any other symptoms.     The history is provided by the patient. No language interpreter was used.    Past Medical History:  Diagnosis Date  . Alcohol abuse, in remission   . Anemia   . Anxiety   . Chronic pain   . Colitis   . Depression   . Esophagitis   . Gastritis   . GERD (gastroesophageal reflux disease)   . Hypertension   . IBS (irritable bowel syndrome)   . Poor dentition     Patient Active Problem List   Diagnosis Date Noted  . Osteopenia determined by x-ray 08/06/2014  . Stress fracture of calcaneus 08/06/2014  . Vitamin D deficiency 12/18/2013  . Dementia 12/18/2013  . Benign microscopic hematuria 07/06/2013  . SIADH (syndrome of inappropriate ADH production) ([place]) 06/22/2013  . Ankylosing spondylitis ([place]) 06/20/2013  . Malnutrition of moderate degree ([place]) 06/20/2013  . Depression with anxiety 05/31/2013  . BPH (benign prostatic hyperplasia) 08/22/2010  . Backache 04/19/2010  . ALCOHOLISM 08/13/2008  . History of colonic polyps 08/13/2008  . Essential  hypertension 07/03/2007  . PUD (peptic ulcer disease) 07/03/2007  . Irritable bowel syndrome 07/03/2007    Past Surgical History:  Procedure Laterality Date  . COLONOSCOPY W/ BIOPSIES    . ESOPHAGOGASTRODUODENOSCOPY    . HERNIA REPAIR Bilateral   . TONSILLECTOMY         Home Medications    Prior to Admission medications   Medication Sig Start Date End Date Taking? Authorizing Provider  acetaminophen (TYLENOL) 500 MG tablet Take 1,000 mg by mouth every 6 (six) hours as needed.    [provider]  Cholecalciferol (VITAMIN D) 2000 units CAPS Take 4,000 Units by mouth daily.     [provider]  clindamycin (CLEOCIN) 300 MG capsule Take 1 capsule (300 mg total) by mouth 4 (four) times daily. X 7 days 10/10/16   Clayton Bibles, PA-C  diazepam (VALIUM) 5 MG tablet Take 1 tablet (5 mg total) by mouth every 8 (eight) hours as needed for anxiety or sedation. 09/09/16   Gareth Morgan, MD  fluticasone (FLONASE) 50 MCG/ACT nasal spray Place 1 spray into both nostrils daily. 09/05/16   [provider]  hydrOXYzine (ATARAX/VISTARIL) 25 MG tablet Take 1 tablet (25 mg total) by mouth every 6 (six) hours. 09/09/16   Gareth Morgan, MD  hydrOXYzine (VISTARIL) 25 MG capsule Take 25 mg by mouth 3 (three) times daily as needed for anxiety.    [provider]  lidocaine (XYLOCAINE) 2 % solution Use as directed  20 mLs in the mouth or throat as needed for mouth pain. 10/10/16   Clayton Bibles, PA-C  lisinopril (PRINIVIL,ZESTRIL) 10 MG tablet Take 10 mg by mouth daily. 09/05/16   [provider]  LORazepam (ATIVAN) 0.5 MG tablet Take 1 tablet (0.5 mg total) by mouth 3 (three) times daily as needed for anxiety or sleep. 06/20/16   Joy, Shawn C, PA-C  ondansetron (ZOFRAN ODT) 4 MG disintegrating tablet Take 1 tablet (4 mg total) by mouth every 8 (eight) hours as needed for nausea or vomiting. 09/09/16   Gareth Morgan, MD  pantoprazole (PROTONIX) 40 MG tablet Take 1 tablet (40 mg  total) by mouth daily. 04/24/14   Janith Lima, MD  promethazine (PHENERGAN) 25 MG suppository Place 1 suppository (25 mg total) rectally every 6 (six) hours as needed for nausea or vomiting. 09/09/16   Gareth Morgan, MD  promethazine (PHENERGAN) 25 MG tablet Take 1 tablet (25 mg total) by mouth every 6 (six) hours as needed for nausea or vomiting. 09/09/16   Gareth Morgan, MD  sucralfate (CARAFATE) 1 G tablet Take 1 tablet (1 g total) by mouth 4 (four) times daily -  with meals and at bedtime. Chew before swallowing 12/17/13   Janith Lima, MD  traMADol (ULTRAM) 50 MG tablet Take 1 tablet (50 mg total) by mouth every 6 (six) hours as needed for severe pain. 10/10/16   Clayton Bibles, PA-C  triamcinolone (KENALOG) 0.025 % cream Apply 1 application topically 2 (two) times daily as needed for rash. 08/15/16   [provider]  verapamil (CALAN-SR) 240 MG CR tablet Take one tablet by mouth at bedtime 08/25/14   [provider]  zolpidem (AMBIEN) 10 MG tablet Take 10 mg by mouth at bedtime. 08/10/16   [provider]    Family History Family History  Problem Relation Age of Onset  . Anxiety disorder Mother   . Congestive Heart Failure Mother   . Crohn's disease Mother   . Arthritis Father   . High blood pressure Father     Social History Social History  Substance Use Topics  . Smoking status: Former Smoker    Types: Cigarettes  . Smokeless tobacco: Never Used  . Alcohol use No     Allergies   Diphenhydramine hcl; Flexeril [cyclobenzaprine]; and Nsaids   Review of Systems Review of Systems  Constitutional: Positive for chills. Negative for fever.  HENT: Positive for dental problem and sore throat. Negative for facial swelling and trouble swallowing.   Respiratory: Negative for shortness of breath and stridor.   Musculoskeletal: Negative for myalgias.     Physical Exam Updated Vital Signs BP (!) 152/79 (BP Location: Left Arm)   Pulse 75   Temp 98 F  (36.7 C) (Oral)   Resp 18   Wt 198 lb (89.8 kg)   SpO2 96%   BMI 27.62 kg/m   Physical Exam  Constitutional: He appears well-developed and well-nourished. No distress.  HENT:  Head: Normocephalic and atraumatic.  Mouth/Throat: Uvula is midline and oropharynx is clear and moist. Mucous membranes are not dry. Abnormal dentition. No uvula swelling. No oropharyngeal exudate, posterior oropharyngeal edema, posterior oropharyngeal erythema or tonsillar abscesses.  Severe widespread dental decay involving all existing dentition to upper and lower jaws. Erythema and tenderness of the gingiva surrounding the upper incisors. No fluctuance or noted focal edema.   Neck: Normal range of motion. Neck supple.  Cardiovascular: Normal rate.   Pulmonary/Chest: Effort normal and breath sounds normal.  No stridor.  Lymphadenopathy:    He has no cervical adenopathy.  Neurological: He is alert.  Skin: He is not diaphoretic.  Nursing note and vitals reviewed.    ED Treatments / Results  DIAGNOSTIC STUDIES: Oxygen Saturation is 96% on RA, nl by my interpretation.    COORDINATION OF CARE: 3:19 PM Discussed treatment plan with pt at bedside and pt agreed to plan.   Procedures Procedures (including critical care time)  Medications Ordered in ED Medications  ketorolac (TORADOL) injection 60 mg (60 mg Intramuscular Given 10/10/16 1541)     Initial Impression / Assessment and Plan / ED Course  I have reviewed the triage vital signs and the nursing notes.    Patient with dentalgia. No abscess requiring immediate incision and drainage. Exam not concerning for Ludwig's angina or pharyngeal abscess. Will treat with short-course of tramadol, clindamycin, and viscous lidocaine Rx. Pt instructed to follow-up with dentist.  Discussed return precautions. Pt safe for discharge.  Final Clinical Impressions(s) / ED Diagnoses   Final diagnoses:  Dental caries  Pain due to dental caries    New  Prescriptions Discharge Medication List as of 10/10/2016  3:25 PM    START taking these medications   Details  clindamycin (CLEOCIN) 300 MG capsule Take 1 capsule (300 mg total) by mouth 4 (four) times daily. X 7 days, Starting Tue 10/10/2016, Print    lidocaine (XYLOCAINE) 2 % solution Use as directed 20 mLs in the mouth or throat as needed for mouth pain., Starting Tue 10/10/2016, Print       I personally performed the services described in this documentation, which was scribed in my presence. The recorded information has been reviewed and is accurate.     Clayton Bibles, PA-C 10/10/16 1842    Fatima Blank, MD 10/11/16 0030

## 2016-10-10 NOTE — ED Notes (Signed)
PT DISCHARGED. INSTRUCTIONS AND PRESCRIPTIONS GIVEN. AAOX4. PT IN NO APPARENT DISTRESS. THE OPPORTUNITY TO ASK QUESTIONS WAS PROVIDED. 

## 2016-10-10 NOTE — Discharge Instructions (Signed)
Read the information below.  Use the prescribed medication as directed.  Please discuss all new medications with your pharmacist.  You may return to the Emergency Department at any time for worsening condition or any new symptoms that concern you.   If you develop fevers, swelling in your face, difficulty swallowing or breathing, return to the ER immediately for a recheck.

## 2016-10-18 ENCOUNTER — Encounter (HOSPITAL_COMMUNITY): Payer: Self-pay

## 2016-10-18 ENCOUNTER — Emergency Department (HOSPITAL_COMMUNITY)
Admission: EM | Admit: 2016-10-18 | Discharge: 2016-10-18 | Disposition: A | Discharge status: Home / Self Care | Payer: Medicaid Other | Attending: Emergency Medicine | Admitting: Emergency Medicine

## 2016-10-18 DIAGNOSIS — Z79899 Other long term (current) drug therapy: Secondary | ICD-10-CM | POA: Insufficient documentation

## 2016-10-18 DIAGNOSIS — Z87891 Personal history of nicotine dependence: Secondary | ICD-10-CM | POA: Insufficient documentation

## 2016-10-18 DIAGNOSIS — F430 Acute stress reaction: Secondary | ICD-10-CM | POA: Diagnosis not present

## 2016-10-18 DIAGNOSIS — F439 Reaction to severe stress, unspecified: Secondary | ICD-10-CM

## 2016-10-18 DIAGNOSIS — I10 Essential (primary) hypertension: Secondary | ICD-10-CM | POA: Insufficient documentation

## 2016-10-18 DIAGNOSIS — F419 Anxiety disorder, unspecified: Secondary | ICD-10-CM | POA: Diagnosis present

## 2016-10-18 LAB — CBG MONITORING, ED: GLUCOSE-CAPILLARY: 121 mg/dL — AB (ref 65–99)

## 2016-10-18 MED ORDER — DIAZEPAM 5 MG PO TABS
10.0000 mg | ORAL_TABLET | Freq: Once | ORAL | Status: AC
  Administered 2016-10-18: 10 mg via ORAL
  Filled 2016-10-18: qty 2

## 2016-10-18 NOTE — ED Provider Notes (Signed)
[facility] DEPT Provider Note   CSN: [id number] Arrival date & time: 10/18/16  0158    History   Chief Complaint Chief Complaint  Patient presents with  . Panic Attack  . Back Pain    HPI Anthony Lamb is a 61 y.o. male.  61 year old male with a history of alcohol abuse, anxiety, depression, esophageal reflux, hypertension, and IBS presents to the emergency department for complaints of anxiety and worsening insomnia. Patient states that he has been unable to sleep secondary to worsening life stressors. He reports that he is supposed to lose his home. He has been prescribed temazepam by his primary care doctor for insomnia management. He also reports being on diazepam and lorazepam in the past. He states that he lost this medication down the drain a few days ago. He is requesting medication to assist with his worsening anxiety.   The history is provided by the patient. No language interpreter was used.  Back Pain      Past Medical History:  Diagnosis Date  . Alcohol abuse, in remission   . Anemia   . Anxiety   . Chronic pain   . Colitis   . Depression   . Esophagitis   . Gastritis   . GERD (gastroesophageal reflux disease)   . Hypertension   . IBS (irritable bowel syndrome)   . Poor dentition     Patient Active Problem List   Diagnosis Date Noted  . Osteopenia determined by x-ray 08/06/2014  . Stress fracture of calcaneus 08/06/2014  . Vitamin D deficiency 12/18/2013  . Dementia 12/18/2013  . Benign microscopic hematuria 07/06/2013  . SIADH (syndrome of inappropriate ADH production) ([place]) 06/22/2013  . Ankylosing spondylitis ([place]) 06/20/2013  . Malnutrition of moderate degree ([place]) 06/20/2013  . Depression with anxiety 05/31/2013  . BPH (benign prostatic hyperplasia) 08/22/2010  . Backache 04/19/2010  . ALCOHOLISM 08/13/2008  . History of colonic polyps 08/13/2008  . Essential hypertension 07/03/2007  . PUD (peptic ulcer disease) 07/03/2007  .  Irritable bowel syndrome 07/03/2007    Past Surgical History:  Procedure Laterality Date  . COLONOSCOPY W/ BIOPSIES    . ESOPHAGOGASTRODUODENOSCOPY    . HERNIA REPAIR Bilateral   . TONSILLECTOMY         Home Medications    Prior to Admission medications   Medication Sig Start Date End Date Taking? Authorizing Provider  acetaminophen (TYLENOL) 500 MG tablet Take 1,000 mg by mouth every 6 (six) hours as needed.    [provider]  Cholecalciferol (VITAMIN D) 2000 units CAPS Take 4,000 Units by mouth daily.     [provider]  clindamycin (CLEOCIN) 300 MG capsule Take 1 capsule (300 mg total) by mouth 4 (four) times daily. X 7 days 10/10/16   Clayton Bibles, PA-C  diazepam (VALIUM) 5 MG tablet Take 1 tablet (5 mg total) by mouth every 8 (eight) hours as needed for anxiety or sedation. 09/09/16   Gareth Morgan, MD  fluticasone (FLONASE) 50 MCG/ACT nasal spray Place 1 spray into both nostrils daily. 09/05/16   [provider]  hydrOXYzine (ATARAX/VISTARIL) 25 MG tablet Take 1 tablet (25 mg total) by mouth every 6 (six) hours. 09/09/16   Gareth Morgan, MD  hydrOXYzine (VISTARIL) 25 MG capsule Take 25 mg by mouth 3 (three) times daily as needed for anxiety.    [provider]  lidocaine (XYLOCAINE) 2 % solution Use as directed 20 mLs in the mouth or throat as needed for mouth pain. 10/10/16  West, Emily, PA-C  lisinopril (PRINIVIL,ZESTRIL) 10 MG tablet Take 10 mg by mouth daily. 09/05/16   [provider]  LORazepam (ATIVAN) 0.5 MG tablet Take 1 tablet (0.5 mg total) by mouth 3 (three) times daily as needed for anxiety or sleep. 06/20/16   Joy, Shawn C, PA-C  ondansetron (ZOFRAN ODT) 4 MG disintegrating tablet Take 1 tablet (4 mg total) by mouth every 8 (eight) hours as needed for nausea or vomiting. 09/09/16   Gareth Morgan, MD  pantoprazole (PROTONIX) 40 MG tablet Take 1 tablet (40 mg total) by mouth daily. 04/24/14   Janith Lima, MD  promethazine  (PHENERGAN) 25 MG suppository Place 1 suppository (25 mg total) rectally every 6 (six) hours as needed for nausea or vomiting. 09/09/16   Gareth Morgan, MD  promethazine (PHENERGAN) 25 MG tablet Take 1 tablet (25 mg total) by mouth every 6 (six) hours as needed for nausea or vomiting. 09/09/16   Gareth Morgan, MD  sucralfate (CARAFATE) 1 G tablet Take 1 tablet (1 g total) by mouth 4 (four) times daily -  with meals and at bedtime. Chew before swallowing 12/17/13   Janith Lima, MD  traMADol (ULTRAM) 50 MG tablet Take 1 tablet (50 mg total) by mouth every 6 (six) hours as needed for severe pain. 10/10/16   Clayton Bibles, PA-C  triamcinolone (KENALOG) 0.025 % cream Apply 1 application topically 2 (two) times daily as needed for rash. 08/15/16   [provider]  verapamil (CALAN-SR) 240 MG CR tablet Take one tablet by mouth at bedtime 08/25/14   [provider]  zolpidem (AMBIEN) 10 MG tablet Take 10 mg by mouth at bedtime. 08/10/16   [provider]    Family History Family History  Problem Relation Age of Onset  . Anxiety disorder Mother   . Congestive Heart Failure Mother   . Crohn's disease Mother   . Arthritis Father   . High blood pressure Father     Social History Social History  Substance Use Topics  . Smoking status: Former Smoker    Types: Cigarettes  . Smokeless tobacco: Never Used  . Alcohol use No     Allergies   Diphenhydramine hcl; Flexeril [cyclobenzaprine]; and Nsaids   Review of Systems Review of Systems  Musculoskeletal: Positive for back pain.  Ten systems reviewed and are negative for acute change, except as noted in the HPI.    Physical Exam Updated Vital Signs BP 102/62 (BP Location: Right Arm)   Pulse 64   Temp 97.7 F (36.5 C) (Oral)   Resp 20   Ht 5' 10"  (1.778 m)   Wt 89.8 kg   SpO2 97%   BMI 28.41 kg/m   Physical Exam  Constitutional: He is oriented to person, place, and time. He appears well-developed and  well-nourished. No distress.  Nontoxic and in NAD  HENT:  Head: Normocephalic and atraumatic.  Eyes: Conjunctivae and EOM are normal. No scleral icterus.  Neck: Normal range of motion.  Cardiovascular: Normal rate, regular rhythm and intact distal pulses.   Pulmonary/Chest: Effort normal. No respiratory distress. He has no wheezes. He has no rales.  Lungs CTAB. Respirations even and unlabored.  Musculoskeletal: Normal range of motion.  Neurological: He is alert and oriented to person, place, and time. He exhibits normal muscle tone. Coordination normal.  Skin: Skin is warm and dry. No rash noted. He is not diaphoretic. No erythema. No pallor.  Psychiatric: His behavior is normal. His mood appears anxious.  Nursing note and vitals reviewed.    ED Treatments / Results  Labs (all labs ordered are listed, but only abnormal results are displayed) Labs Reviewed  CBG MONITORING, ED - Abnormal; Notable for the following:       Result Value   Glucose-Capillary 121 (*)    All other components within normal limits    EKG  EKG Interpretation None       Radiology No results found.  Procedures Procedures (including critical care time)  Medications Ordered in ED Medications  diazepam (VALIUM) tablet 10 mg (not administered)     Initial Impression / Assessment and Plan / ED Course  I have reviewed the triage vital signs and the nursing notes.  Pertinent labs & imaging results that were available during my care of the patient were reviewed by me and considered in my medical decision making (see chart for details).     61 year old male with a history of anxiety presents to the emergency department for worsening symptoms. He states that his medication "fell down the drain" a few days ago. He has been unable to take his temazepam for this reason, making it hard for him to sleep. His insomnia is subsequently worsening his anxiety.  I have explained to patient that we are unable to  refill prescriptions for chronic complaints. As the patient has clearly been followed by his primary care doctor closely for this, I believe this is appropriate for the patient to deferred to his primary doctor for any further refills. The patient has been given a tablet of Valium in the emergency department for his anxiety. I do not believe further emergent workup is indicated. Patient discharged in stable condition with no unaddressed concerns.   Final Clinical Impressions(s) / ED Diagnoses   Final diagnoses:  Anxiety  Stress reaction, emotional    New Prescriptions New Prescriptions   No medications on file     Antonietta Breach, Hershal Coria 10/18/16 Ricarda Frame, MD 10/18/16 [phone]

## 2016-10-18 NOTE — ED Triage Notes (Signed)
Pt reports severe chronic back pain and worsening anxiety r/t loosing his home at the end of the month. Pt reports taking tylenol w/o relief. Pt A+OX4, speaking in complete sentences, ambulatory to triage.

## 2016-10-18 NOTE — ED Notes (Signed)
Patient is alert and oriented x3.  He was given DC instructions and follow up visit instructions.  Patient gave verbal understanding.  He was DC ambulatory under his own power to home.  V/S stable.  He was not showing any signs of distress on DC

## 2016-10-18 NOTE — ED Notes (Signed)
Pt also c/o not eating or sleeping in days.

## 2016-11-08 ENCOUNTER — Encounter (HOSPITAL_COMMUNITY): Payer: Self-pay | Admitting: Emergency Medicine

## 2016-11-08 ENCOUNTER — Emergency Department (HOSPITAL_COMMUNITY)
Admission: EM | Admit: 2016-11-08 | Discharge: 2016-11-08 | Disposition: A | Discharge status: Home / Self Care | Payer: Medicaid Other | Attending: Emergency Medicine | Admitting: Emergency Medicine

## 2016-11-08 DIAGNOSIS — I10 Essential (primary) hypertension: Secondary | ICD-10-CM | POA: Insufficient documentation

## 2016-11-08 DIAGNOSIS — F419 Anxiety disorder, unspecified: Secondary | ICD-10-CM | POA: Insufficient documentation

## 2016-11-08 DIAGNOSIS — R197 Diarrhea, unspecified: Secondary | ICD-10-CM | POA: Insufficient documentation

## 2016-11-08 DIAGNOSIS — Z79899 Other long term (current) drug therapy: Secondary | ICD-10-CM | POA: Diagnosis not present

## 2016-11-08 DIAGNOSIS — Z87891 Personal history of nicotine dependence: Secondary | ICD-10-CM | POA: Insufficient documentation

## 2016-11-08 DIAGNOSIS — R1084 Generalized abdominal pain: Secondary | ICD-10-CM | POA: Diagnosis present

## 2016-11-08 LAB — COMPREHENSIVE METABOLIC PANEL
ALBUMIN: 4.3 g/dL (ref 3.5–5.0)
ALT: 24 U/L (ref 17–63)
AST: 29 U/L (ref 15–41)
Alkaline Phosphatase: 77 U/L (ref 38–126)
Anion gap: 12 (ref 5–15)
CHLORIDE: 91 mmol/L — AB (ref 101–111)
CO2: 24 mmol/L (ref 22–32)
CREATININE: 0.77 mg/dL (ref 0.61–1.24)
Calcium: 9.1 mg/dL (ref 8.9–10.3)
GFR calc Af Amer: >60 mL/min (ref >60)
GLUCOSE: 139 mg/dL — AB (ref 65–99)
POTASSIUM: 4.1 mmol/L (ref 3.5–5.1)
Sodium: 127 mmol/L — ABNORMAL LOW (ref 135–145)
Total Bilirubin: 0.7 mg/dL (ref 0.3–1.2)
Total Protein: 7.8 g/dL (ref 6.5–8.1)

## 2016-11-08 LAB — LIPASE, BLOOD: LIPASE: 23 U/L (ref 11–51)

## 2016-11-08 LAB — CBC
HEMATOCRIT: 41.7 % (ref 39.0–52.0)
Hemoglobin: 14.7 g/dL (ref 13.0–17.0)
MCH: 30.9 pg (ref 26.0–34.0)
MCHC: 35.3 g/dL (ref 30.0–36.0)
MCV: 87.6 fL (ref 78.0–100.0)
PLATELETS: 392 10*3/uL (ref 150–400)
RBC: 4.76 MIL/uL (ref 4.22–5.81)
RDW: 12.2 % (ref 11.5–15.5)
WBC: 9.8 10*3/uL (ref 4.0–10.5)

## 2016-11-08 MED ORDER — LORAZEPAM 1 MG PO TABS
1.0000 mg | ORAL_TABLET | Freq: Once | ORAL | Status: AC
  Administered 2016-11-08: 1 mg via ORAL
  Filled 2016-11-08: qty 1

## 2016-11-08 MED ORDER — ONDANSETRON HCL 4 MG/2ML IJ SOLN
4.0000 mg | Freq: Once | INTRAMUSCULAR | Status: AC
  Administered 2016-11-08: 4 mg via INTRAVENOUS
  Filled 2016-11-08: qty 2

## 2016-11-08 MED ORDER — HYDROXYZINE HCL 25 MG PO TABS: 25.0000 mg | ORAL_TABLET | Freq: Four times a day (QID) | ORAL | 0 refills | Status: DC

## 2016-11-08 MED ORDER — FAMOTIDINE IN NACL 20-0.9 MG/50ML-% IV SOLN
20.0000 mg | Freq: Once | INTRAVENOUS | Status: AC
  Administered 2016-11-08: 20 mg via INTRAVENOUS
  Filled 2016-11-08: qty 50

## 2016-11-08 MED ORDER — SODIUM CHLORIDE 0.9 % IV BOLUS (SEPSIS)
500.0000 mL | Freq: Once | INTRAVENOUS | Status: AC
  Administered 2016-11-08: 500 mL via INTRAVENOUS

## 2016-11-08 NOTE — Discharge Instructions (Signed)
Take imodium for your diarrhea.  Try taking herbal supplements like melatonin to help you with your sleep.  Follow up with your primary care doctor to discuss about refills of your ambien.

## 2016-11-08 NOTE — ED Triage Notes (Addendum)
Pt c/o abdominal pain and diarrhea x several days (4-5). Pt states he has had much stress and it has affected his appetite. Pt with epigastric burning and states he has not been able to sleep x several days. Pt denies vomiting.

## 2016-11-08 NOTE — ED Notes (Signed)
Pt attempted to urinate but was not able to.

## 2016-11-08 NOTE — ED Provider Notes (Addendum)
[facility] DEPT Provider Note   CSN: [id number] Arrival date & time: 11/08/16  1046     History   Chief Complaint Chief Complaint  Patient presents with  . Abdominal Pain  . Diarrhea    HPI Anthony Lamb is a 61 y.o. male.  HPI Pt has been having diarrhea and poor appetite.  Pt states he is having a burning pain in his abdomen.  He has been having loose stools without blood.  He Has had several bouts of diarrhea in the last few days. Because of his persistent symptoms he decided to come in to the emergency room.  Patient also mentions he has a lot of trouble with bad anxiety. He has not been sleeping well. He is getting kicked out of his house and has a court date that's coming out and this is stressing him out. Patient denies any suicidal or homicidal ideation. Patient states he takes Ambien and has benzodiazepines in the past. He thinks he may have thrown out his prescriptions by accident. He is requesting refills of something for his sleep and his nerves. Has been taking hydroxyzine but that does not help. Past Medical History:  Diagnosis Date  . Alcohol abuse, in remission   . Anemia   . Anxiety   . Chronic pain   . Colitis   . Depression   . Esophagitis   . Gastritis   . GERD (gastroesophageal reflux disease)   . Hypertension   . IBS (irritable bowel syndrome)   . Poor dentition     Patient Active Problem List   Diagnosis Date Noted  . Osteopenia determined by x-ray 08/06/2014  . Stress fracture of calcaneus 08/06/2014  . Vitamin D deficiency 12/18/2013  . Dementia 12/18/2013  . Benign microscopic hematuria 07/06/2013  . SIADH (syndrome of inappropriate ADH production) ([place]) 06/22/2013  . Ankylosing spondylitis ([place]) 06/20/2013  . Malnutrition of moderate degree ([place]) 06/20/2013  . Depression with anxiety 05/31/2013  . BPH (benign prostatic hyperplasia) 08/22/2010  . Backache 04/19/2010  . ALCOHOLISM 08/13/2008  . History of colonic polyps 08/13/2008   . Essential hypertension 07/03/2007  . PUD (peptic ulcer disease) 07/03/2007  . Irritable bowel syndrome 07/03/2007    Past Surgical History:  Procedure Laterality Date  . COLONOSCOPY W/ BIOPSIES    . ESOPHAGOGASTRODUODENOSCOPY    . HERNIA REPAIR Bilateral   . TONSILLECTOMY         Home Medications    Prior to Admission medications   Medication Sig Start Date End Date Taking? Authorizing Provider  Cholecalciferol (VITAMIN D) 2000 units CAPS Take 4,000 Units by mouth daily.    Yes [provider]  ferrous sulfate 325 (65 FE) MG tablet Take 325 mg by mouth daily with breakfast.   Yes [provider]  HYDROcodone-acetaminophen (NORCO/VICODIN) 5-325 MG tablet Take 1 tablet by mouth 3 (three) times daily as needed. 11/06/16  Yes [provider]  lisinopril (PRINIVIL,ZESTRIL) 10 MG tablet Take 10 mg by mouth daily. 09/05/16  Yes [provider]  pantoprazole (PROTONIX) 40 MG tablet Take 1 tablet (40 mg total) by mouth daily. 04/24/14  Yes Janith Lima, MD  sucralfate (CARAFATE) 1 G tablet Take 1 tablet (1 g total) by mouth 4 (four) times daily -  with meals and at bedtime. Chew before swallowing 12/17/13  Yes Janith Lima, MD  verapamil (CALAN-SR) 240 MG CR tablet Take one tablet by mouth at bedtime 08/25/14  Yes [provider]  zolpidem (AMBIEN) 10 MG  tablet Take 10 mg by mouth at bedtime. 08/10/16  Yes [provider]  hydrOXYzine (ATARAX/VISTARIL) 25 MG tablet Take 1 tablet (25 mg total) by mouth every 6 (six) hours. 11/08/16   Dorie Rank, MD    Family History Family History  Problem Relation Age of Onset  . Anxiety disorder Mother   . Congestive Heart Failure Mother   . Crohn's disease Mother   . Arthritis Father   . High blood pressure Father     Social History Social History  Substance Use Topics  . Smoking status: Former Smoker    Types: Cigarettes  . Smokeless tobacco: Never Used  . Alcohol use No     Allergies    Diphenhydramine hcl; Flexeril [cyclobenzaprine]; and Nsaids   Review of Systems Review of Systems   Physical Exam Updated Vital Signs BP (!) 160/82 (BP Location: Left Arm)   Pulse 70   Temp 98.6 F (37 C) (Oral)   Resp 16   SpO2 100%   Physical Exam  Constitutional: He appears well-developed and well-nourished. No distress.  HENT:  Head: Normocephalic and atraumatic.  Right Ear: External ear normal.  Left Ear: External ear normal.  Eyes: Conjunctivae are normal. Right eye exhibits no discharge. Left eye exhibits no discharge. No scleral icterus.  Neck: Neck supple. No tracheal deviation present.  Cardiovascular: Normal rate, regular rhythm and intact distal pulses.   Pulmonary/Chest: Effort normal and breath sounds normal. No stridor. No respiratory distress. He has no wheezes. He has no rales.  Abdominal: Soft. Bowel sounds are normal. He exhibits no distension. There is no tenderness. There is no rebound and no guarding.  Musculoskeletal: He exhibits no edema or tenderness.  Neurological: He is alert. He has normal strength. No cranial nerve deficit (no facial droop, extraocular movements intact, no slurred speech) or sensory deficit. He exhibits normal muscle tone. He displays no seizure activity. Coordination normal.  Skin: Skin is warm and dry. No rash noted.  Psychiatric: He has a normal mood and affect. He expresses no homicidal and no suicidal ideation.  Nursing note and vitals reviewed.    ED Treatments / Results  Labs (all labs ordered are listed, but only abnormal results are displayed) Labs Reviewed  COMPREHENSIVE METABOLIC PANEL - Abnormal; Notable for the following:       Result Value   Sodium 127 (*)    Chloride 91 (*)    Glucose, Bld 139 (*)    BUN <5 (*)    All other components within normal limits  LIPASE, BLOOD  CBC    Procedures Procedures (including critical care time)  Medications Ordered in ED Medications  famotidine (PEPCID) IVPB 20 mg  premix (0 mg Intravenous Stopped 11/08/16 1424)  sodium chloride 0.9 % bolus 500 mL (0 mLs Intravenous Stopped 11/08/16 1510)  ondansetron (ZOFRAN) injection 4 mg (4 mg Intravenous Given 11/08/16 1354)  LORazepam (ATIVAN) tablet 1 mg (1 mg Oral Given 11/08/16 1354)     Initial Impression / Assessment and Plan / ED Course  I have reviewed the triage vital signs and the nursing notes.  Pertinent labs & imaging results that were available during my care of the patient were reviewed by me and considered in my medical decision making (see chart for details).  I reviewed [place] DEA.   42 tablets of hydrocodone prescribed on the 4th of June.  30 ambien tablets prescribed on the 21st of May.  30 tablets of temazepam prescribed on the 5th of May.  Patient presents with complaints of nausea and diarrhea. Laboratory tests do not show any signs of significant dehydration. I have ordered an IV fluid bolus and will give him 1 Ativan here in the emergency room. The patient is requesting refills of his sleep and anxiety meds. Explained to them because these are controlled substances he should follow up with his primary care doctor to get these refills.   Final Clinical Impressions(s) / ED Diagnoses   Final diagnoses:  Anxiety  Diarrhea, unspecified type    New Prescriptions Discharge Medication List as of 11/08/2016  1:21 PM  Hydroxyzine     Dorie Rank, MD 11/09/16 [phone]

## 2016-11-11 ENCOUNTER — Emergency Department (HOSPITAL_COMMUNITY): Payer: Medicaid Other

## 2016-11-11 ENCOUNTER — Observation Stay (HOSPITAL_COMMUNITY)
Admission: EM | Admit: 2016-11-11 | Discharge: 2016-11-12 | Disposition: A | Discharge status: Home / Self Care | Payer: Medicaid Other | Attending: Family Medicine | Admitting: Family Medicine

## 2016-11-11 DIAGNOSIS — K219 Gastro-esophageal reflux disease without esophagitis: Secondary | ICD-10-CM | POA: Diagnosis not present

## 2016-11-11 DIAGNOSIS — G8929 Other chronic pain: Secondary | ICD-10-CM

## 2016-11-11 DIAGNOSIS — Z79899 Other long term (current) drug therapy: Secondary | ICD-10-CM | POA: Insufficient documentation

## 2016-11-11 DIAGNOSIS — R079 Chest pain, unspecified: Principal | ICD-10-CM | POA: Insufficient documentation

## 2016-11-11 DIAGNOSIS — R0602 Shortness of breath: Secondary | ICD-10-CM | POA: Diagnosis not present

## 2016-11-11 DIAGNOSIS — F419 Anxiety disorder, unspecified: Secondary | ICD-10-CM | POA: Diagnosis not present

## 2016-11-11 DIAGNOSIS — M549 Dorsalgia, unspecified: Secondary | ICD-10-CM

## 2016-11-11 DIAGNOSIS — Z87891 Personal history of nicotine dependence: Secondary | ICD-10-CM | POA: Insufficient documentation

## 2016-11-11 DIAGNOSIS — R0789 Other chest pain: Secondary | ICD-10-CM | POA: Diagnosis present

## 2016-11-11 DIAGNOSIS — I10 Essential (primary) hypertension: Secondary | ICD-10-CM | POA: Insufficient documentation

## 2016-11-11 LAB — CBC WITH DIFFERENTIAL/PLATELET
BASOS ABS: 0 10*3/uL (ref 0.0–0.1)
Basophils Relative: 0 %
EOS ABS: 0.1 10*3/uL (ref 0.0–0.7)
EOS PCT: 1 %
HCT: 43.5 % (ref 39.0–52.0)
Hemoglobin: 15.1 g/dL (ref 13.0–17.0)
LYMPHS ABS: 1.4 10*3/uL (ref 0.7–4.0)
LYMPHS PCT: 12 %
MCH: 30.6 pg (ref 26.0–34.0)
MCHC: 34.7 g/dL (ref 30.0–36.0)
MCV: 88.2 fL (ref 78.0–100.0)
Monocytes Absolute: 1.2 10*3/uL — ABNORMAL HIGH (ref 0.1–1.0)
Monocytes Relative: 10 %
Neutro Abs: 8.9 10*3/uL — ABNORMAL HIGH (ref 1.7–7.7)
Neutrophils Relative %: 77 %
PLATELETS: 373 10*3/uL (ref 150–400)
RBC: 4.93 MIL/uL (ref 4.22–5.81)
RDW: 12.3 % (ref 11.5–15.5)
WBC: 11.5 10*3/uL — AB (ref 4.0–10.5)

## 2016-11-11 LAB — COMPREHENSIVE METABOLIC PANEL
ALT: 25 U/L (ref 17–63)
ANION GAP: 12 (ref 5–15)
AST: 36 U/L (ref 15–41)
Albumin: 4.1 g/dL (ref 3.5–5.0)
Alkaline Phosphatase: 77 U/L (ref 38–126)
BUN: <5 mg/dL — ABNORMAL LOW (ref 6–20)
CHLORIDE: 92 mmol/L — AB (ref 101–111)
CO2: 23 mmol/L (ref 22–32)
Calcium: 9 mg/dL (ref 8.9–10.3)
Creatinine, Ser: 0.8 mg/dL (ref 0.61–1.24)
Glucose, Bld: 128 mg/dL — ABNORMAL HIGH (ref 65–99)
POTASSIUM: 4 mmol/L (ref 3.5–5.1)
SODIUM: 127 mmol/L — AB (ref 135–145)
Total Bilirubin: 0.6 mg/dL (ref 0.3–1.2)
Total Protein: 7.5 g/dL (ref 6.5–8.1)

## 2016-11-11 LAB — TROPONIN I
TROPONIN I: 0.03 ng/mL — AB (ref <0.03)
Troponin I: <0.03 ng/mL (ref <0.03)

## 2016-11-11 LAB — RAPID URINE DRUG SCREEN, HOSP PERFORMED
AMPHETAMINES: NOT DETECTED
BENZODIAZEPINES: POSITIVE — AB
Barbiturates: NOT DETECTED
COCAINE: NOT DETECTED
OPIATES: POSITIVE — AB
TETRAHYDROCANNABINOL: NOT DETECTED

## 2016-11-11 MED ORDER — ENOXAPARIN SODIUM 40 MG/0.4ML ~~LOC~~ SOLN
40.0000 mg | SUBCUTANEOUS | Status: DC
  Administered 2016-11-11: 40 mg via SUBCUTANEOUS
  Filled 2016-11-11: qty 0.4

## 2016-11-11 MED ORDER — FOLIC ACID 1 MG PO TABS
1.0000 mg | ORAL_TABLET | Freq: Every day | ORAL | Status: DC
  Administered 2016-11-11 – 2016-11-12 (×2): 1 mg via ORAL
  Filled 2016-11-11 (×2): qty 1

## 2016-11-11 MED ORDER — LISINOPRIL 10 MG PO TABS
10.0000 mg | ORAL_TABLET | Freq: Every day | ORAL | Status: DC
Start: 2016-11-11 — End: 2016-11-12
  Administered 2016-11-11 – 2016-11-12 (×2): 10 mg via ORAL
  Filled 2016-11-11 (×2): qty 1

## 2016-11-11 MED ORDER — SODIUM CHLORIDE 0.9 % IV SOLN: INTRAVENOUS | Status: DC

## 2016-11-11 MED ORDER — FERROUS SULFATE 325 (65 FE) MG PO TABS
325.0000 mg | ORAL_TABLET | Freq: Every day | ORAL | Status: DC
  Administered 2016-11-12: 325 mg via ORAL
  Filled 2016-11-11: qty 1

## 2016-11-11 MED ORDER — VERAPAMIL HCL ER 240 MG PO TBCR
240.0000 mg | EXTENDED_RELEASE_TABLET | Freq: Every day | ORAL | Status: DC
  Administered 2016-11-11: 240 mg via ORAL
  Filled 2016-11-11: qty 1

## 2016-11-11 MED ORDER — LORAZEPAM 2 MG/ML IJ SOLN
1.0000 mg | Freq: Four times a day (QID) | INTRAMUSCULAR | Status: DC | PRN
  Administered 2016-11-11 – 2016-11-12 (×2): 1 mg via INTRAVENOUS
  Filled 2016-11-11 (×2): qty 1

## 2016-11-11 MED ORDER — SUCRALFATE 1 G PO TABS
1.0000 g | ORAL_TABLET | Freq: Three times a day (TID) | ORAL | Status: DC
  Administered 2016-11-11 – 2016-11-12 (×3): 1 g via ORAL
  Filled 2016-11-11 (×3): qty 1

## 2016-11-11 MED ORDER — HYDROCODONE-ACETAMINOPHEN 5-325 MG PO TABS
1.0000 | ORAL_TABLET | Freq: Three times a day (TID) | ORAL | Status: DC | PRN
  Administered 2016-11-11 (×2): 1 via ORAL
  Filled 2016-11-11 (×2): qty 1

## 2016-11-11 MED ORDER — LORAZEPAM 2 MG/ML IJ SOLN
1.0000 mg | Freq: Once | INTRAMUSCULAR | Status: AC
Start: 2016-11-11 — End: 2016-11-11
  Administered 2016-11-11: 1 mg via INTRAVENOUS
  Filled 2016-11-11: qty 1

## 2016-11-11 MED ORDER — ADULT MULTIVITAMIN W/MINERALS CH
1.0000 | ORAL_TABLET | Freq: Every day | ORAL | Status: DC
  Administered 2016-11-11 – 2016-11-12 (×2): 1 via ORAL
  Filled 2016-11-11 (×2): qty 1

## 2016-11-11 MED ORDER — PANTOPRAZOLE SODIUM 40 MG PO TBEC
40.0000 mg | DELAYED_RELEASE_TABLET | Freq: Every day | ORAL | Status: DC
  Administered 2016-11-12: 40 mg via ORAL
  Filled 2016-11-11 (×2): qty 1

## 2016-11-11 MED ORDER — ZOLPIDEM TARTRATE 5 MG PO TABS
10.0000 mg | ORAL_TABLET | Freq: Every evening | ORAL | Status: DC | PRN
  Administered 2016-11-11: 10 mg via ORAL
  Filled 2016-11-11: qty 2

## 2016-11-11 MED ORDER — VITAMIN B-1 100 MG PO TABS
100.0000 mg | ORAL_TABLET | Freq: Every day | ORAL | Status: DC
  Administered 2016-11-11 – 2016-11-12 (×2): 100 mg via ORAL
  Filled 2016-11-11 (×2): qty 1

## 2016-11-11 MED ORDER — LORAZEPAM 1 MG PO TABS
1.0000 mg | ORAL_TABLET | Freq: Four times a day (QID) | ORAL | Status: DC | PRN
  Filled 2016-11-11: qty 1

## 2016-11-11 MED ORDER — HYDROXYZINE HCL 25 MG PO TABS
25.0000 mg | ORAL_TABLET | Freq: Four times a day (QID) | ORAL | Status: DC
  Administered 2016-11-11 – 2016-11-12 (×3): 25 mg via ORAL
  Filled 2016-11-11 (×4): qty 1

## 2016-11-11 MED ORDER — ACETAMINOPHEN 325 MG PO TABS: 650.0000 mg | ORAL_TABLET | ORAL | Status: DC | PRN

## 2016-11-11 MED ORDER — ONDANSETRON HCL 4 MG/2ML IJ SOLN: 4.0000 mg | Freq: Four times a day (QID) | INTRAMUSCULAR | Status: DC | PRN

## 2016-11-11 MED ORDER — THIAMINE HCL 100 MG/ML IJ SOLN: 100.0000 mg | Freq: Every day | INTRAMUSCULAR | Status: DC

## 2016-11-11 NOTE — ED Triage Notes (Signed)
Patient comes in per [facility] with cp in central, radiation to back. Nausea, though some nausea at baseline. Hx anxiety, HTN, back pain, stomach ulcers. SOB noted, lungs clear. ekg SR. 168/96, 76, 99%, 22 RA. Under stress. Supposed to be taking Ambien. Patient refused aspirin d/t stomach ulcers and refused nitro d/t headaches. 20 LW.

## 2016-11-11 NOTE — ED Provider Notes (Signed)
[facility] DEPT Provider Note   CSN: [id number] Arrival date & time: 11/11/16  0703     History   Chief Complaint Chief Complaint  Patient presents with  . Chest Pain  . Anxiety    HPI Anthony Lamb is a 61 y.o. male.  61 year old male with history of anxiety presents with chest discomfort as well as insomnia. States his court date in 2 days and he's noticed sleep because he examined him. States that his chest discomfort is characterized as tightness worse when he becomes more anxious. Denies any exertional chest pain. He is nauseated but no vomiting. Denies any fever or chills or cough. No suicidal homicidal ideations. Sooner recently for similar symptoms. Patient states that he only received a prescription for benzodiazepine he would feel better      Past Medical History:  Diagnosis Date  . Alcohol abuse, in remission   . Anemia   . Anxiety   . Chronic pain   . Colitis   . Depression   . Esophagitis   . Gastritis   . GERD (gastroesophageal reflux disease)   . Hypertension   . IBS (irritable bowel syndrome)   . Poor dentition     Patient Active Problem List   Diagnosis Date Noted  . Osteopenia determined by x-ray 08/06/2014  . Stress fracture of calcaneus 08/06/2014  . Vitamin D deficiency 12/18/2013  . Dementia 12/18/2013  . Benign microscopic hematuria 07/06/2013  . SIADH (syndrome of inappropriate ADH production) ([place]) 06/22/2013  . Ankylosing spondylitis ([place]) 06/20/2013  . Malnutrition of moderate degree ([place]) 06/20/2013  . Depression with anxiety 05/31/2013  . BPH (benign prostatic hyperplasia) 08/22/2010  . Backache 04/19/2010  . ALCOHOLISM 08/13/2008  . History of colonic polyps 08/13/2008  . Essential hypertension 07/03/2007  . PUD (peptic ulcer disease) 07/03/2007  . Irritable bowel syndrome 07/03/2007    Past Surgical History:  Procedure Laterality Date  . COLONOSCOPY W/ BIOPSIES    . ESOPHAGOGASTRODUODENOSCOPY    . HERNIA REPAIR  Bilateral   . TONSILLECTOMY         Home Medications    Prior to Admission medications   Medication Sig Start Date End Date Taking? Authorizing Provider  Cholecalciferol (VITAMIN D) 2000 units CAPS Take 4,000 Units by mouth daily.     [provider]  ferrous sulfate 325 (65 FE) MG tablet Take 325 mg by mouth daily with breakfast.    [provider]  HYDROcodone-acetaminophen (NORCO/VICODIN) 5-325 MG tablet Take 1 tablet by mouth 3 (three) times daily as needed. 11/06/16   [provider]  hydrOXYzine (ATARAX/VISTARIL) 25 MG tablet Take 1 tablet (25 mg total) by mouth every 6 (six) hours. 11/08/16   Dorie Rank, MD  lisinopril (PRINIVIL,ZESTRIL) 10 MG tablet Take 10 mg by mouth daily. 09/05/16   [provider]  pantoprazole (PROTONIX) 40 MG tablet Take 1 tablet (40 mg total) by mouth daily. 04/24/14   Janith Lima, MD  sucralfate (CARAFATE) 1 G tablet Take 1 tablet (1 g total) by mouth 4 (four) times daily -  with meals and at bedtime. Chew before swallowing 12/17/13   Janith Lima, MD  verapamil (CALAN-SR) 240 MG CR tablet Take one tablet by mouth at bedtime 08/25/14   [provider]  zolpidem (AMBIEN) 10 MG tablet Take 10 mg by mouth at bedtime. 08/10/16   [provider]    Family History Family History  Problem Relation Age of Onset  . Anxiety disorder Mother   .  Congestive Heart Failure Mother   . Crohn's disease Mother   . Arthritis Father   . High blood pressure Father     Social History Social History  Substance Use Topics  . Smoking status: Former Smoker    Types: Cigarettes  . Smokeless tobacco: Never Used  . Alcohol use No     Allergies   Diphenhydramine hcl; Flexeril [cyclobenzaprine]; and Nsaids   Review of Systems Review of Systems  All other systems reviewed and are negative.    Physical Exam Updated Vital Signs Temp 98.4 F (36.9 C) (Oral)   Ht 1.778 m (5' 10" )   Wt 89.8 kg (198 lb)   BMI  28.41 kg/m   Physical Exam  Constitutional: He is oriented to person, place, and time. He appears well-developed and well-nourished.  Non-toxic appearance. No distress.  HENT:  Head: Normocephalic and atraumatic.  Eyes: Conjunctivae, EOM and lids are normal. Pupils are equal, round, and reactive to light.  Neck: Normal range of motion. Neck supple. No tracheal deviation present. No thyroid mass present.  Cardiovascular: Normal rate, regular rhythm and normal heart sounds.  Exam reveals no gallop.   No murmur heard. Pulmonary/Chest: Effort normal and breath sounds normal. No stridor. No respiratory distress. He has no decreased breath sounds. He has no wheezes. He has no rhonchi. He has no rales.  Abdominal: Soft. Normal appearance and bowel sounds are normal. He exhibits no distension. There is no tenderness. There is no rebound and no CVA tenderness.  Musculoskeletal: Normal range of motion. He exhibits no edema or tenderness.  Neurological: He is alert and oriented to person, place, and time. He has normal strength. No cranial nerve deficit or sensory deficit. GCS eye subscore is 4. GCS verbal subscore is 5. GCS motor subscore is 6.  Skin: Skin is warm and dry. No abrasion and no rash noted.  Psychiatric: His behavior is normal. His mood appears anxious. His speech is rapid and/or pressured.  Nursing note and vitals reviewed.    ED Treatments / Results  Labs (all labs ordered are listed, but only abnormal results are displayed) Labs Reviewed  CBC WITH DIFFERENTIAL/PLATELET  COMPREHENSIVE METABOLIC PANEL  TROPONIN I    EKG  EKG Interpretation  Date/Time:  Saturday November 11 2016 07:37:34 EDT Ventricular Rate:  64 PR Interval:    QRS Duration: 112 QT Interval:  444 QTC Calculation: 459 R Axis:   32 Text Interpretation:  Sinus rhythm Atrial premature complex Incomplete right bundle branch block No significant change since last tracing Confirmed by Claud Gowan  MD, [place] ([id number]) on  11/11/2016 7:48:31 AM       Radiology No results found.  Procedures Procedures (including critical care time)  Medications Ordered in ED Medications  0.9 %  sodium chloride infusion (not administered)  LORazepam (ATIVAN) injection 1 mg (not administered)     Initial Impression / Assessment and Plan / ED Course  I have reviewed the triage vital signs and the nursing notes.  Pertinent labs & imaging results that were available during my care of the patient were reviewed by me and considered in my medical decision making (see chart for details).     Patient treated for anxiety here. Did have elevation of his troponin. He will be admitted for serial markers.  Final Clinical Impressions(s) / ED Diagnoses   Final diagnoses:  SOB (shortness of breath)    New Prescriptions New Prescriptions   No medications on file     Lacretia Leigh, MD  11/11/16 1102  

## 2016-11-11 NOTE — H&P (Signed)
[facility] Admission History and Physical Service Pager: [phone]  Patient name: Anthony Lamb Medical record number: [id number] Date of birth: 1956/01/13 Age: 61 y.o. Gender: male  Primary Care Provider: Sandi Mariscal, MD Consultants: Cardiology in am Code Status: Full  Chief Complaint: chest pain  Assessment and Plan: Anthony Lamb is a 61 y.o. male presenting with chest pain. PMH is significant for HTN, anxiety, depression, chronic pain, GERD, PUD, HLD.  Chest pain. Presented with substernal chest pressure ongoing since last night. Initial troponin 0.03, EKG without ST changes. HEART score 6. Anxiety likely a significant contributor but patient has multiple risk factors. - Place in observation, FPTS attending Dr. Andria Frames - monitor on telemetry - trend troponins - AM EKG - Risk stratify with lipid panel, A1c, TSH - Cardiology recommendations  HTN. BP on admission 158/89. At home on lisinopril 32m qd and verapamil CR 248mqhs - continue home lisinopril and verapamil  Anxiety/Depression and regular EtOH use. On hydroxyzine chronic as outpatient with recently prescribed BZD by PCP. Per [place] database, #30 temazepam on 10/07/16. Regularly drinks 6-7 beers per week, no h/o of EtOH withdrawal. - continue home hydroxyzine - monitor on CIWA protocol  Chronic pain 2/2 ankylosing spondylitis. At home on norco 5-32514mID prn. Per [place] database, prescribed #42 norco on 11/06/16 and also tramadol #12 11/02/16, #45 10/19/16. - No NSAIDs in home regimen >> likely due to h/o GERD/PUD >> strong consideration for initiation of naproxen/meloxicam with increased PPI dosage and/or addition of H2 blocker, as most data supports the use of antiinflammatories over opiates for AS.  PUD/GERD. At home on protonix 63m67m and sucralfate 1g QID - continue home protonix and sucralfate  HLD. Not on statin at home. Last lipid panel in 2015 LDL126 - lipid panel   Insomnia. At home on  ambien 10mg25m.  - continue home ambien  FEN/GI: heart healthy, protonix, sucralfate  Prophylaxis: lovenox  Disposition: admit to FPTS  History of Present Illness:  Anthony Lamb 60 y.68 male presenting with chest pain.  States has gradual onset substernal chest pain starting last night, pressure sensation, radiating to back, rated 7/10, associated with diaphoresis. Thought it was his anxiety because it was similar in sensation to anxiety attacks he has had in the past and had recently been under a lot of stress d/t court date for his home foreclosure scheduled for Monday. Chest pain persisted overnight, was unable to sleep d/t accidentally disposing of home ambien per patient. Therefore he presented to ED to antianxiety medication. States chet pain is now improved at 5/10 but feels very anxious.  In ED, given 1mg a31man and troponin found to be elevated at 0.03.   Review Of Systems: Per HPI with the following additions:   Review of Systems  Constitutional: Positive for diaphoresis. Negative for chills and fever.  HENT: Negative for sore throat.   Respiratory: Negative for cough, shortness of breath and wheezing.   Cardiovascular: Positive for chest pain. Negative for palpitations and leg swelling.  Gastrointestinal: Positive for heartburn and melena (dark green). Negative for abdominal pain, blood in stool, constipation, diarrhea, nausea and vomiting.  Genitourinary: Negative for dysuria, frequency, hematuria and urgency.  Musculoskeletal: Positive for back pain (chronic).  Psychiatric/Behavioral: The patient is nervous/anxious.     Patient Active Problem List   Diagnosis Date Noted  . Osteopenia determined by x-ray 08/06/2014  . Stress fracture of calcaneus 08/06/2014  . Vitamin D deficiency 12/18/2013  .  Dementia 12/18/2013  . Benign microscopic hematuria 07/06/2013  . SIADH (syndrome of inappropriate ADH production) ([place]) 06/22/2013  . Ankylosing spondylitis ([place])  06/20/2013  . Malnutrition of moderate degree ([place]) 06/20/2013  . Depression with anxiety 05/31/2013  . BPH (benign prostatic hyperplasia) 08/22/2010  . Backache 04/19/2010  . ALCOHOLISM 08/13/2008  . History of colonic polyps 08/13/2008  . Essential hypertension 07/03/2007  . PUD (peptic ulcer disease) 07/03/2007  . Irritable bowel syndrome 07/03/2007    Past Medical History: Past Medical History:  Diagnosis Date  . Alcohol abuse, in remission   . Anemia   . Anxiety   . Chronic pain   . Colitis   . Depression   . Esophagitis   . Gastritis   . GERD (gastroesophageal reflux disease)   . Hypertension   . IBS (irritable bowel syndrome)   . Poor dentition     Past Surgical History: Past Surgical History:  Procedure Laterality Date  . COLONOSCOPY W/ BIOPSIES    . ESOPHAGOGASTRODUODENOSCOPY    . HERNIA REPAIR Bilateral   . TONSILLECTOMY      Social History: Social History  Substance Use Topics  . Smoking status: Former Smoker    Types: Cigarettes  . Smokeless tobacco: Never Used  . Alcohol use No   Additional social history: Lives alone at home. EtOH drinks beer regularly, 6-7 beers a week. No recreational drug use, quit marijuana 4 years ago. Please also refer to relevant sections of EMR.  Family History: Family History  Problem Relation Age of Onset  . Anxiety disorder Mother   . Congestive Heart Failure Mother   . Crohn's disease Mother   . Arthritis Father   . High blood pressure Father     Allergies and Medications: Allergies  Allergen Reactions  . Diphenhydramine Hcl Other (See Comments)    Restlessness  . Flexeril [Cyclobenzaprine] Other (See Comments)    Restlessness  . Nsaids Other (See Comments)    GI upset   No current facility-administered medications on file prior to encounter.    Current Outpatient Prescriptions on File Prior to Encounter  Medication Sig Dispense Refill  . Cholecalciferol (VITAMIN D) 2000 units CAPS Take 4,000 Units by  mouth daily.     . ferrous sulfate 325 (65 FE) MG tablet Take 325 mg by mouth daily with breakfast.    . HYDROcodone-acetaminophen (NORCO/VICODIN) 5-325 MG tablet Take 1 tablet by mouth 3 (three) times daily as needed for moderate pain.   0  . hydrOXYzine (ATARAX/VISTARIL) 25 MG tablet Take 1 tablet (25 mg total) by mouth every 6 (six) hours. 20 tablet 0  . lisinopril (PRINIVIL,ZESTRIL) 10 MG tablet Take 10 mg by mouth daily.  2  . pantoprazole (PROTONIX) 40 MG tablet Take 1 tablet (40 mg total) by mouth daily. 30 tablet 11  . sucralfate (CARAFATE) 1 G tablet Take 1 tablet (1 g total) by mouth 4 (four) times daily -  with meals and at bedtime. Chew before swallowing 120 tablet 5  . verapamil (CALAN-SR) 240 MG CR tablet Take one tablet by mouth at bedtime  3  . zolpidem (AMBIEN) 10 MG tablet Take 10 mg by mouth at bedtime.  0    Objective: BP (!) 156/72   Pulse 63   Temp 98.4 F (36.9 C) (Oral)   Resp 19   Ht 5' 10"  (1.778 m)   Wt 198 lb (89.8 kg)   SpO2 98%   BMI 28.41 kg/m  Exam: General: Sitting up on side  of bed, in NAD Eyes: EOMI, conjunctiva normal ENTM: MMM, poor dentition Neck: supple, normal ROM Cardiovascular: RRR, no murmurs Respiratory: CTAB, normal effort on room air Gastrointestinal: soft, nontender, nondistended, + bs MSK: moving all limbs equally Derm: warm and dry  Neuro: alert and oriented, no focal deficits Psych: appears anxious  Labs and Imaging: CBC BMET   Recent Labs Lab 11/11/16 0720  WBC 11.5*  HGB 15.1  HCT 43.5  PLT 373    Recent Labs Lab 11/11/16 0720  NA 127*  K 4.0  CL 92*  CO2 23  BUN <5*  CREATININE 0.80  GLUCOSE 128*  CALCIUM 9.0     Troponin 0.03   Dg Chest 2 View  Result Date: 11/11/2016 CLINICAL DATA:  Chest pain and shortness of breath EXAM: CHEST  2 VIEW COMPARISON:  September 09, 2016 FINDINGS: There is slight left base atelectasis. Lungs elsewhere are clear. Heart size and pulmonary vascularity are normal. No adenopathy.  There is aortic atherosclerosis. No evident bone lesions. IMPRESSION: Mild left base atelectasis. No edema or consolidation. Stable cardiac silhouette. There is aortic atherosclerosis. Electronically Signed   By: Lowella Grip III M.D.   On: 11/11/2016 08:05    Bufford Lope, DO 11/11/2016, 11:00 AM PGY-1, [facility] Intern pager: [phone], text pages welcome  Upper Level Addendum:  I have seen and evaluated this patient along with Dr. Shawna Orleans and reviewed the above note, making necessary revisions in red.   Elberta Leatherwood, MD,MS,  PGY3 11/11/2016 9:29 PM

## 2016-11-12 DIAGNOSIS — R0602 Shortness of breath: Secondary | ICD-10-CM | POA: Diagnosis not present

## 2016-11-12 DIAGNOSIS — I10 Essential (primary) hypertension: Secondary | ICD-10-CM | POA: Diagnosis not present

## 2016-11-12 DIAGNOSIS — F419 Anxiety disorder, unspecified: Secondary | ICD-10-CM | POA: Diagnosis not present

## 2016-11-12 DIAGNOSIS — K219 Gastro-esophageal reflux disease without esophagitis: Secondary | ICD-10-CM | POA: Diagnosis not present

## 2016-11-12 DIAGNOSIS — R079 Chest pain, unspecified: Secondary | ICD-10-CM | POA: Diagnosis not present

## 2016-11-12 LAB — LIPID PANEL
CHOL/HDL RATIO: 3.7 ratio
Cholesterol: 177 mg/dL (ref 0–200)
HDL: 48 mg/dL (ref >40)
LDL Cholesterol: 108 mg/dL — ABNORMAL HIGH (ref 0–99)
Triglycerides: 106 mg/dL (ref <150)
VLDL: 21 mg/dL (ref 0–40)

## 2016-11-12 LAB — CBC
HCT: 40.7 % (ref 39.0–52.0)
Hemoglobin: 14 g/dL (ref 13.0–17.0)
MCH: 30.6 pg (ref 26.0–34.0)
MCHC: 34.4 g/dL (ref 30.0–36.0)
MCV: 89.1 fL (ref 78.0–100.0)
PLATELETS: 338 10*3/uL (ref 150–400)
RBC: 4.57 MIL/uL (ref 4.22–5.81)
RDW: 12.2 % (ref 11.5–15.5)
WBC: 13.3 10*3/uL — AB (ref 4.0–10.5)

## 2016-11-12 LAB — BASIC METABOLIC PANEL
ANION GAP: 11 (ref 5–15)
BUN: <5 mg/dL — ABNORMAL LOW (ref 6–20)
CALCIUM: 9 mg/dL (ref 8.9–10.3)
CO2: 24 mmol/L (ref 22–32)
CREATININE: 0.74 mg/dL (ref 0.61–1.24)
Chloride: 93 mmol/L — ABNORMAL LOW (ref 101–111)
GLUCOSE: 92 mg/dL (ref 65–99)
Potassium: 3.5 mmol/L (ref 3.5–5.1)
Sodium: 128 mmol/L — ABNORMAL LOW (ref 135–145)

## 2016-11-12 LAB — HIV ANTIBODY (ROUTINE TESTING W REFLEX): HIV SCREEN 4TH GENERATION: NONREACTIVE

## 2016-11-12 LAB — TROPONIN I: Troponin I: <0.03 ng/mL (ref <0.03)

## 2016-11-12 LAB — TSH: TSH: 2.675 u[IU]/mL (ref 0.350–4.500)

## 2016-11-12 MED ORDER — ATORVASTATIN CALCIUM 20 MG PO TABS: 20.0000 mg | ORAL_TABLET | Freq: Every day | ORAL | Status: DC

## 2016-11-12 MED ORDER — BUSPIRONE HCL 5 MG PO TABS: 5.0000 mg | ORAL_TABLET | Freq: Two times a day (BID) | ORAL | 0 refills | Status: DC

## 2016-11-12 MED ORDER — LORAZEPAM 1 MG PO TABS: 1.0000 mg | ORAL_TABLET | Freq: Four times a day (QID) | ORAL | Status: DC | PRN

## 2016-11-12 MED ORDER — LORAZEPAM 2 MG/ML IJ SOLN: 1.0000 mg | Freq: Four times a day (QID) | INTRAMUSCULAR | Status: DC | PRN

## 2016-11-12 MED ORDER — BUSPIRONE HCL 5 MG PO TABS: 5.0000 mg | ORAL_TABLET | Freq: Two times a day (BID) | ORAL | Status: DC

## 2016-11-12 MED ORDER — LORAZEPAM 0.5 MG PO TABS: 0.5000 mg | ORAL_TABLET | Freq: Every day | ORAL | 0 refills | Status: DC

## 2016-11-12 MED ORDER — REGADENOSON 0.4 MG/5ML IV SOLN
0.4000 mg | Freq: Once | INTRAVENOUS | Status: DC
  Filled 2016-11-12: qty 5

## 2016-11-12 MED ORDER — ATORVASTATIN CALCIUM 20 MG PO TABS: 20.0000 mg | ORAL_TABLET | Freq: Every day | ORAL | 0 refills | Status: DC

## 2016-11-12 NOTE — Discharge Summary (Signed)
[facility] Discharge Summary  Patient name: Anthony Lamb Medical record number: [id number] Date of birth: [birth year] Age: 61 y.o. Gender: male Date of Admission: 11/11/2016  Date of Discharge: 11/12/16 Admitting Physician: Zenia Resides, MD  Primary Care Provider: Sandi Mariscal, MD Consultants: cardiology  Indication for Hospitalization: chest pain  Discharge Diagnoses/Problem List:  Chest pain, unspecified   Disposition:  Patient left AMA prior to chest pain work up   Discharge Condition:  Stable, improved  Brief Hospital Course:  The patient is a 61 y/o male with a history significant for severe anxiety who presented to the ED with chest pain which was gradual onset, substernal in nature, characterized as pressure the night prior to admission. He felt it was similar to anxiety in past (unable to sleep due to accidentally disposing of Ambien) but it did not resolve overnight.  In the ED he was given 1059m of Ativan, however his troponin was 0.03 therefore FPTS was asked to admit for chest pain work up. From the history, his symptoms did not sound very cardiac in nature, however he does have risk factors.   Cardiology evaluated patient on the subsequent day and ordered a Lexiscan myovue. Unfortunately, the patient forgot he was NPO and ate GCatheryn Bacontherefore his test was re-scheduled for the subsequent day. The patient had significant stressors at home and needed to meet with an attorney to prevent foreclosure on his home on the morning  6/11, therefore opted to leave AMA despite our discussions of the risks.    As ASCVD score was 11%, he was started on Lipitor during this hospitalization.  Given significant anxiety without a controller medication (pt does not feel hydroxyzine scheduled helps), added Buspar 520mBID. Pt notes he tried this medication once prior for a couple of days and had headaches, however he was willing to try it again. Additionally,  apparently the patient accidentally disposed of his Ambien and cannot get this refilled until 6/20. He notes significant issues with sleep due to anxiety and requests Ativan. Patient given Ativan 0.59m29mHS for 10 days, discussed it should last 10 days.   Issues for Follow Up:  1. Given significant anxiety and a h/o hyponatremia, pt started on Buspar 59mg31mD. Evaluate response and consider titration up. 2. For ankylosing spondylitis consider adding NSAID to regimen as mainstay of treatment and is opioid sparing. Given h/o PUD/GERD could consider naproxen or Mobic with an increased PPI dosage vs adding an H2 blocker.  Significant Procedures: none  Significant Labs and Imaging:   Recent Labs Lab 11/08/16 1132 11/11/16 0720 11/12/16 0020  WBC 9.8 11.5* 13.3*  HGB 14.7 15.1 14.0  HCT 41.7 43.5 40.7  PLT 392 373 338    Recent Labs Lab 11/08/16 1132 11/11/16 0720 11/12/16 0020  NA 127* 127* 128*  K 4.1 4.0 3.5  CL 91* 92* 93*  CO2 24 23 24   GLUCOSE 139* 128* 92  BUN <5* <5* <5*  CREATININE 0.77 0.80 0.74  CALCIUM 9.1 9.0 9.0  ALKPHOS 77 77  --   AST 29 36  --   ALT 24 25  --   ALBUMIN 4.3 4.1  --     Troponin <0.03 x 3 Risk Stratification Labs  TSH    Component Value Date/Time   TSH 2.675 11/12/2016 0020   TSH 1.10 08/06/2014 1410   Hemoglobin A1C No results found for: HGBA1C Lipid Panel     Component Value Date/Time   CHOL 177  11/12/2016 0020   TRIG 106 11/12/2016 0020   HDL 48 11/12/2016 0020   CHOLHDL 3.7 11/12/2016 0020   VLDL 21 11/12/2016 0020   LDLCALC 108 (H) 11/12/2016 0020    Results/Tests Pending at Time of Discharge: A1c  Discharge Medications:  Allergies as of 11/12/2016      Reactions   Diphenhydramine Hcl Other (See Comments)   Restlessness   Flexeril [cyclobenzaprine] Other (See Comments)   Restlessness   Nsaids Other (See Comments)   GI upset      Medication List    TAKE these medications   atorvastatin 20 MG tablet Commonly  known as:  LIPITOR Take 1 tablet (20 mg total) by mouth daily at 6 PM.   busPIRone 5 MG tablet Commonly known as:  BUSPAR Take 1 tablet (5 mg total) by mouth 2 (two) times daily.   ferrous sulfate 325 (65 FE) MG tablet Take 325 mg by mouth daily with breakfast.   HYDROcodone-acetaminophen 5-325 MG tablet Commonly known as:  NORCO/VICODIN Take 1 tablet by mouth 3 (three) times daily as needed for moderate pain.   hydrOXYzine 25 MG tablet Commonly known as:  ATARAX/VISTARIL Take 1 tablet (25 mg total) by mouth every 6 (six) hours.   lisinopril 10 MG tablet Commonly known as:  PRINIVIL,ZESTRIL Take 10 mg by mouth daily.   LORazepam 0.5 MG tablet Commonly known as:  ATIVAN Take 1 tablet (0.5 mg total) by mouth at bedtime.   pantoprazole 40 MG tablet Commonly known as:  PROTONIX Take 1 tablet (40 mg total) by mouth daily.   sucralfate 1 g tablet Commonly known as:  CARAFATE Take 1 tablet (1 g total) by mouth 4 (four) times daily -  with meals and at bedtime. Chew before swallowing   verapamil 240 MG CR tablet Commonly known as:  CALAN-SR Take one tablet by mouth at bedtime   Vitamin D 2000 units Caps Take 4,000 Units by mouth daily.   zolpidem 10 MG tablet Commonly known as:  AMBIEN Take 10 mg by mouth at bedtime.       Discharge Instructions: Please refer to Patient Instructions section of EMR for full details.  Patient was counseled important signs and symptoms that should prompt return to medical care, changes in medications, dietary instructions, activity restrictions, and follow up appointments.   Follow-Up Appointments: Follow-up Information    Josue Hector, MD Follow up.   Specialty:  Cardiology Why:  A staff message has been sent to the office for hospital follow up in 7-14 days. If the patient has not heard from our office by 11/15/16, he is to call [phone] to schedule an appointment.  Contact information: [address] [place] [address] [phone]        Sandi Mariscal, MD. Call.   Specialty:  Internal Medicine Why:  to schedule an appt in the next week Contact information: [place] [address] [phone]           Archie Patten, MD 11/12/2016, 12:19 PM PGY-3, [place]

## 2016-11-12 NOTE — Progress Notes (Signed)
Family Medicine Teaching Service Daily Progress Note Intern Pager: [phone]  Patient name: Anthony Lamb Medical record number: [id number] Date of birth: March 01, 1956 Age: 61 y.o. Gender: male  Primary Care Provider: Sandi Mariscal, MD Consultants:  Cardiology Code Status:  Full  Pt Overview and Major Events to Date:  6/9- presented with chest pain, initial troponin 0.03, subsequent <0.03  Assessment and Plan: Anthony Lamb is a 61 y.o. male presenting with chest pain. PMH is significant for HTN, anxiety, depression, chronic pain, GERD, PUD, HLD.  Chest pain. Presented with substernal chest pressure ongoing since last night. Initial troponin 0.03, however subsequent troponins <0.03.  EKG without ST changes on admission and this AM. HEART score 6. Anxiety likely a significant contributor but patient has multiple risk factors. - monitor on telemetry - f/u A1c - Cardiology recommendations: lexiscan myovue ordered, can go home if normal  HTN. BP 141/84.  At home on lisinopril 87m qd and verapamil CR 2462mqhs - continue home lisinopril and verapamil - continue to monitor and consider increasing lisinopril  Anxiety/Depression and regular EtOH use. On hydroxyzine chronic as outpatient with recently prescribed Restoril by PCP. Per [place] database, #30 temazepam on 10/06/16, Ambien 1044m30 on 5/21.  It is understandable he would shy away from SSRIs given hyponatremia.   Regularly drinks 6-7 beers per week, no h/o of EtOH withdrawal. - continue home hydroxyzine - monitor on CIWA protocol: not recorded over night but 6 and 8 per RN due to anxiety - per overnight team, it seems pt is "begging for benzos" per RN and received 2 PRN doses of Ativan - changing Ativan PRN order to read only for a CIWA >8 (so he's getting hydroxyzine that is scheduled instead of PRN Ativan).  - would benefit from starting Buspar 5mg39mD-- would not want to increase to quickly given he's on verapamil which will increase  the plasma concentration of Buspar.  - will discuss with attending concerning discharge with a small amount of Ativan 0.5mg 17mhronic pain 2/2 ankylosing spondylitis. At home on norco 5-325mg 6mprn. Per [place] database, prescribed #42 norco on 11/06/16 and also tramadol #12 11/02/16, #45 10/19/16. - No NSAIDs in home regimen >> likely due to h/o GERD/PUD >> strong consideration for initiation of naproxen/meloxicam with increased PPI dosage and/or addition of H2 blocker, as most data supports the use of anti-inflammatories over opiates for AS.   PUD/GERD. At home on protonix 40mg q54md sucralfate 1g QID - continue home protonix and sucralfate  HLD. Not on statin at home. The 10-year ASCVD risk score (Goff DMikey Bussing, etBrooke Bonitol., 2013) is: 10.9%   Values used to calculate the score:     Age: 42 year84    Sex: Male     Is Non-Hispanic African American: No     Diabetic: No     Tobacco smoker: No     Systolic Blood Pressure: 141 mmH654   Is BP treated: Yes     HDL Cholesterol: 48 mg/dL     Total Cholesterol: 177 mg/dL  - will start a statin  Hyponatremia: has been present for several years and stable. Evolemic on exam. Could be SIADH due to opiates. Patient hoping to go home today, could consider further work up in the outpatient setting.  Insomnia. At home on ambien 10mg qh50m- continue home ambien  [place]: heart healthy, protonix, sucralfate  PPx: lovenox   Disposition: pending myovue  Subjective:  Prior to  going into the patient's room, his RN warns me that he is very anxious and very well may leave AMA. Patient has to meet with an attorney tomorrow to prevent foreclosure on his home. Pt states he's not doing well, very anxious and he needs to leave. Asks if he can come back and do the myovue at another time. No chest pain or SOB currently but endorses severe anxiety. States he has not been able to sleep in the last 3 days due to the anxiety. He asks that we give him lorazepam before he goes  so he can make it to his attorneys appointment and tie him over until he can see his PCP. He states otherwise he'll just be back here.  He believes his chest pain was likely due to the fact he could not sleep for 3 days due to the terrible chest pain.  Asked patient if he was ever on anything else for anxiety and initially he states no, however when I mention SSRIs he notes he will not take these due to low sodium.   Objective: Temp:  [98 F (36.7 C)-98.5 F (36.9 C)] 98.5 F (36.9 C) (06/10 0436) Pulse Rate:  [61-83] 63 (06/10 0436) Resp:  [14-74] 14 (06/10 0436) BP: (133-167)/(64-103) 141/84 (06/10 0436) SpO2:  [94 %-100 %] 99 % (06/10 0436) Weight:  [187 lb 11.2 oz (85.1 kg)-190 lb 11.2 oz (86.5 kg)] 187 lb 11.2 oz (85.1 kg) (06/10 0436) Physical Exam: General: Sitting on the side of the bed, anxious, fidgety.  Eyes: Conjunctivae non-injected.  ENTM: Moist mucous membranes. Oropharynx clear. No nasal discharge.  Cardiovascular: RRR. No murmurs, rubs, or gallops noted. No pitting edema noted. Respiratory: No increased WOB. CTAB without wheezing, rhonchi, or crackles noted. Abdomen: +BS, soft, non-distended, non-tender.  MSK: Normal bulk and tone noted. No gross deformities noted.  Psych: Very anxious, jittery. Stated mood anxious, affect congruent.  Normal speech and tone, non-pressured. Does not appear internally distracted.    Laboratory:  Recent Labs Lab 11/08/16 1132 11/11/16 0720 11/12/16 0020  WBC 9.8 11.5* 13.3*  HGB 14.7 15.1 14.0  HCT 41.7 43.5 40.7  PLT 392 373 338    Recent Labs Lab 11/08/16 1132 11/11/16 0720 11/12/16 0020  NA 127* 127* 128*  K 4.1 4.0 3.5  CL 91* 92* 93*  CO2 24 23 24   BUN <5* <5* <5*  CREATININE 0.77 0.80 0.74  CALCIUM 9.1 9.0 9.0  PROT 7.8 7.5  --   BILITOT 0.7 0.6  --   ALKPHOS 77 77  --   ALT 24 25  --   AST 29 36  --   GLUCOSE 139* 128* 92    Troponin <0.03 x 3  Risk Stratification Labs  TSH    Component Value  Date/Time   TSH 2.675 11/12/2016 0020   TSH 1.10 08/06/2014 1410   Hemoglobin A1C No results found for: HGBA1C Lipid Panel     Component Value Date/Time   CHOL 177 11/12/2016 0020   TRIG 106 11/12/2016 0020   HDL 48 11/12/2016 0020   CHOLHDL 3.7 11/12/2016 0020   VLDL 21 11/12/2016 0020   LDLCALC 108 (H) 11/12/2016 0020   UDS: Positive for benzodiazepines and opiates   Imaging/Diagnostic Tests:  Dg Chest 2 View  Result Date: 11/11/2016 CLINICAL DATA:  Chest pain and shortness of breath EXAM: CHEST  2 VIEW COMPARISON:  September 09, 2016 FINDINGS: There is slight left base atelectasis. Lungs elsewhere are clear. Heart size and pulmonary vascularity  are normal. No adenopathy. There is aortic atherosclerosis. No evident bone lesions. IMPRESSION: Mild left base atelectasis. No edema or consolidation. Stable cardiac silhouette. There is aortic atherosclerosis. Electronically Signed   By: Lowella Grip III M.D.   On: 11/11/2016 08:05    Archie Patten, MD 11/12/2016, 7:25 AM PGY-3, [place] Intern pager: [phone], text pages welcome

## 2016-11-12 NOTE — Discharge Instructions (Signed)
You came in with chest pain.  Our hope is that is was just due to anxiety.  We were unable to complete the work up to determine if it was something more severe (like a blocked artery in your heart) as you wanted to go home against medical advice.  Given you cholesterol levels, I have started you on Lipitor to decrease your risk of heart attack and stroke  Since you accidentally disposed of the Ambien, I have prescribed Ativan, 10 tablets. These 10 tablets should last you for 10 days. You were also started on a medication call Buspar twice daily to help with anxiety-- it does not cause sodium to be low.   Nonspecific Chest Pain Chest pain can be caused by many different conditions. There is always a chance that your pain could be related to something serious, such as a heart attack or a blood clot in your lungs. Chest pain can also be caused by conditions that are not life-threatening. If you have chest pain, it is very important to follow up with your health care provider. What are the causes? Causes of this condition include:  Heartburn.  Pneumonia or bronchitis.  Anxiety or stress.  Inflammation around your heart (pericarditis) or lung (pleuritis or pleurisy).  A blood clot in your lung.  A collapsed lung (pneumothorax). This can develop suddenly on its own (spontaneous pneumothorax) or from trauma to the chest.  Shingles infection (varicella-zoster virus).  Heart attack.  Damage to the bones, muscles, and cartilage that make up your chest wall. This can include: ? Bruised bones due to injury. ? Strained muscles or cartilage due to frequent or repeated coughing or overwork. ? Fracture to one or more ribs. ? Sore cartilage due to inflammation (costochondritis).  What increases the risk? Risk factors for this condition may include:  Activities that increase your risk for trauma or injury to your chest.  Respiratory infections or conditions that cause frequent  coughing.  Medical conditions or overeating that can cause heartburn.  Heart disease or family history of heart disease.  Conditions or health behaviors that increase your risk of developing a blood clot.  Having had chicken pox (varicella zoster).  What are the signs or symptoms? Chest pain can feel like:  Burning or tingling on the surface of your chest or deep in your chest.  Crushing, pressure, aching, or squeezing pain.  Dull or sharp pain that is worse when you move, cough, or take a deep breath.  Pain that is also felt in your back, neck, shoulder, or arm, or pain that spreads to any of these areas.  Your chest pain may come and go, or it may stay constant. How is this diagnosed? Lab tests or other studies may be needed to find the cause of your pain. Your health care provider may have you take a test called an ECG (electrocardiogram). An ECG records your heartbeat patterns at the time the test is performed. You may also have other tests, such as:  Transthoracic echocardiogram (TTE). In this test, sound waves are used to create a picture of the heart structures and to look at how blood flows through your heart.  Transesophageal echocardiogram (TEE).This is a more advanced imaging test that takes images from inside your body. It allows your health care provider to see your heart in finer detail.  Cardiac monitoring. This allows your health care provider to monitor your heart rate and rhythm in real time.  Holter monitor. This is a portable device  that records your heartbeat and can help to diagnose abnormal heartbeats. It allows your health care provider to track your heart activity for several days, if needed.  Stress tests. These can be done through exercise or by taking medicine that makes your heart beat more quickly.  Blood tests.  Other imaging tests.  How is this treated? Treatment depends on what is causing your chest pain. Treatment may include:  Medicines.  These may include: ? Acid blockers for heartburn. ? Anti-inflammatory medicine. ? Pain medicine for inflammatory conditions. ? Antibiotic medicine, if an infection is present. ? Medicines to dissolve blood clots. ? Medicines to treat coronary artery disease (CAD).  Supportive care for conditions that do not require medicines. This may include: ? Resting. ? Applying heat or cold packs to injured areas. ? Limiting activities until pain decreases.  Follow these instructions at home: Medicines  If you were prescribed an antibiotic, take it as told by your health care provider. Do not stop taking the antibiotic even if you start to feel better.  Take over-the-counter and prescription medicines only as told by your health care provider. Lifestyle  Do not use any products that contain nicotine or tobacco, such as cigarettes and e-cigarettes. If you need help quitting, ask your health care provider.  Do not drink alcohol.  Make lifestyle changes as directed by your health care provider. These may include: ? Getting regular exercise. Ask your health care provider to suggest some activities that are safe for you. ? Eating a heart-healthy diet. A registered dietitian can help you to learn healthy eating options. ? Maintaining a healthy weight. ? Managing diabetes, if necessary. ? Reducing stress, such as with yoga or relaxation techniques. General instructions  Avoid any activities that bring on chest pain.  If heartburn is the cause for your chest pain, raise (elevate) the head of your bed about 6 inches (15 cm) by putting blocks under the legs. Sleeping with more pillows does not effectively relieve heartburn because it only changes the position of your head.  Keep all follow-up visits as told by your health care provider. This is important. This includes any further testing if your chest pain does not go away. Contact a health care provider if:  Your chest pain does not go  away.  You have a rash with blisters on your chest.  You have a fever.  You have chills. Get help right away if:  Your chest pain is worse.  You have a cough that gets worse, or you cough up blood.  You have severe pain in your abdomen.  You have severe weakness.  You faint.  You have sudden, unexplained chest discomfort.  You have sudden, unexplained discomfort in your arms, back, neck, or jaw.  You have shortness of breath at any time.  You suddenly start to sweat, or your skin gets clammy.  You feel nauseous or you vomit.  You suddenly feel light-headed or dizzy.  Your heart begins to beat quickly, or it feels like it is skipping beats. These symptoms may represent a serious problem that is an emergency. Do not wait to see if the symptoms will go away. Get medical help right away. Call your local emergency services (911 in the [place] Do not drive yourself to the hospital. This information is not intended to replace advice given to you by your health care provider. Make sure you discuss any questions you have with your health care provider. Document Released: 03/01/2005 Document Revised: 02/14/2016 Document  Reviewed: 02/14/2016 Elsevier Interactive Patient Education  2017 [employer].

## 2016-11-12 NOTE — Consult Note (Signed)
Cardiology Consultation:   Patient ID: Anthony Lamb; [id number]; Oct 10, 1955   Admit date: 11/11/2016 Date of Consult: 11/12/2016  Primary Care Provider: Sandi Mariscal, MD Primary Cardiologist: New/Jaeline Whobrey Primary Electrophysiologist:  None   Patient Profile:   Anthony Lamb is a 61 y.o. male with a hx of alcohol abue, anxiety, chonric back pain from ankylosing spondylitis GERD  who is being seen today for the evaluation of chest pain at the request of Hensel. CRF: HTN former smoker Under stress as has lawyer appt in am about foreclosure on his home. Poor activity level due to chronic back pain on Norco. SSCP last night radiating to back With diaphoresis. Similar to anxiety attacks Persisted over night went to ER as pain still 5/10.  Improved with valium   CXR NAD troponin negative ECG no acute changes   History of Present Illness:   Anthony Lamb 61 y.o. no documented CAD or vascular disease. CRF;s   Past Medical History:  Diagnosis Date  . Alcohol abuse, in remission   . Anemia   . Anxiety   . Chronic pain   . Colitis   . Depression   . Esophagitis   . Gastritis   . GERD (gastroesophageal reflux disease)   . Hypertension   . IBS (irritable bowel syndrome)   . Poor dentition     Past Surgical History:  Procedure Laterality Date  . COLONOSCOPY W/ BIOPSIES    . ESOPHAGOGASTRODUODENOSCOPY    . HERNIA REPAIR Bilateral   . TONSILLECTOMY       Inpatient Medications: Scheduled Meds: . enoxaparin (LOVENOX) injection  40 mg Subcutaneous Q24H  . ferrous sulfate  325 mg Oral Q breakfast  . folic acid  1 mg Oral Daily  . hydrOXYzine  25 mg Oral Q6H  . lisinopril  10 mg Oral Daily  . multivitamin with minerals  1 tablet Oral Daily  . pantoprazole  40 mg Oral Daily  . sucralfate  1 g Oral TID WC & HS  . thiamine  100 mg Oral Daily   Or  . thiamine  100 mg Intravenous Daily  . verapamil  240 mg Oral QHS   Continuous Infusions:  PRN Meds: acetaminophen,  HYDROcodone-acetaminophen, LORazepam **OR** LORazepam, ondansetron (ZOFRAN) IV, zolpidem  Allergies:    Allergies  Allergen Reactions  . Diphenhydramine Hcl Other (See Comments)    Restlessness  . Flexeril [Cyclobenzaprine] Other (See Comments)    Restlessness  . Nsaids Other (See Comments)    GI upset    Social History:   Social History   Social History  . Marital status: Single    Spouse name: N/A  . Number of children: N/A  . Years of education: N/A   Occupational History  . Not on file.   Social History Main Topics  . Smoking status: Former Smoker    Types: Cigarettes  . Smokeless tobacco: Never Used  . Alcohol use No  . Drug use: No     Comment: marijuana, quit 4 months ago.    Marland Kitchen Sexual activity: No   Other Topics Concern  . Not on file   Social History Narrative   HSG. Long - term disability - unable to work. Lived with his mother in her house - she died 14-May-2023 - he is bankrupt/destitute and soon will loose his home. He has not worked enough quarters to be eligible for [employer]. He is advised to seek assistance for health care either with Health Insurance exchange of [employer].    Family History:   The patient's family history includes Anxiety disorder in his mother; Arthritis in his father; Congestive Heart Failure in his mother; Crohn's disease in his mother; High blood pressure in his father.  ROS:  Please see the history of present illness.  ROS  All other ROS reviewed and negative.     Physical Exam/Data:   Vitals:   11/11/16 2121 11/12/16 0011 11/12/16 0230 11/12/16 0436  BP: (!) 152/86 139/74  (!) 141/84  Pulse:  73  63  Resp:  (!) 74  14  Temp:  98 F (36.7 C)  98.5 F (36.9 C)  TempSrc:    Oral  SpO2:  99%  99%  Weight:   86.5 kg (190 lb 11.2 oz) 85.1 kg (187 lb 11.2 oz)  Height:        Intake/Output Summary (Last 24 hours) at 11/12/16 4536 Last data filed at 11/12/16 0600  Gross per 24 hour  Intake              330 ml    Output                0 ml  Net              330 ml   Filed Weights   11/11/16 0717 11/12/16 0230 11/12/16 0436  Weight: 89.8 kg (198 lb) 86.5 kg (190 lb 11.2 oz) 85.1 kg (187 lb 11.2 oz)   Body mass index is 26.93 kg/m.  General:  Well nourished, well developed, in no acute distress  HEENT: normal Lymph: no adenopathy Neck: no JVD Endocrine:  No thryomegaly Vascular: No carotid bruits; FA pulses 2+ bilaterally without bruits  Cardiac:  normal S1, S2; RRR; no murmur   Lungs:  clear to auscultation bilaterally, no wheezing, rhonchi or rales  Abd: soft, nontender, no hepatomegaly  Ext: no edema Musculoskeletal:  No deformities, BUE and BLE strength normal and equal Skin: warm and dry  Neuro:  CNs 2-12 intact, no focal abnormalities noted Psych:  Normal affect    EKG:  The EKG was personally reviewed and demonstrates NSR nonspecific ST changes  Relevant CV Studies: None  Laboratory Data:  Chemistry  Recent Labs Lab 11/08/16 1132 11/11/16 0720 11/12/16 0020  NA 127* 127* 128*  K 4.1 4.0 3.5  CL 91* 92* 93*  CO2 24 23 24   GLUCOSE 139* 128* 92  BUN <5* <5* <5*  CREATININE 0.77 0.80 0.74  CALCIUM 9.1 9.0 9.0  GFRNONAA >60 >60 >60  GFRAA >60 >60 >60  ANIONGAP 12 12 11      Recent Labs Lab 11/08/16 1132 11/11/16 0720  PROT 7.8 7.5  ALBUMIN 4.3 4.1  AST 29 36  ALT 24 25  ALKPHOS 77 77  BILITOT 0.7 0.6   Hematology  Recent Labs Lab 11/08/16 1132 11/11/16 0720 11/12/16 0020  WBC 9.8 11.5* 13.3*  RBC 4.76 4.93 4.57  HGB 14.7 15.1 14.0  HCT 41.7 43.5 40.7  MCV 87.6 88.2 89.1  MCH 30.9 30.6 30.6  MCHC 35.3 34.7 34.4  RDW 12.2 12.3 12.2  PLT 392 373 338   Cardiac Enzymes  Recent Labs Lab 11/11/16 0720 11/11/16 1308 11/11/16 1814 11/12/16 0020  TROPONINI 0.03* <0.03 <0.03 <0.03   No results for input(s): TROPIPOC in the last 168 hours.  BNPNo results for input(s): BNP, PROBNP in the last 168 hours.  DDimer No results for input(s): DDIMER in  the last 168 hours.  Radiology/Studies:  Dg Chest 2 View  Result Date: 11/11/2016 CLINICAL DATA:  Chest pain and shortness of breath EXAM: CHEST  2 VIEW COMPARISON:  September 09, 2016 FINDINGS: There is slight left base atelectasis. Lungs elsewhere are clear. Heart size and pulmonary vascularity are normal. No adenopathy. There is aortic atherosclerosis. No evident bone lesions. IMPRESSION: Mild left base atelectasis. No edema or consolidation. Stable cardiac silhouette. There is aortic atherosclerosis. Electronically Signed   By: Lowella Grip III M.D.   On: 11/11/2016 08:05    Assessment and Plan:   1. Chest Pain:  Atypical r/o normal ECG unable to walk on ETT due to back pain lexiscan myovue ordered.  D/c home latter today if normal due to court date 2. HTN:  Well controlled.  Continue current medications and low sodium Dash type diet.   3. Ankylosing Spondylitis:  norco as at home f/u rheum 4. Anxiety:  Situational chronic ? Start SSRI 5. GERD:  Continue protonix and sucralfate    Signed, Jenkins Rouge, MD  11/12/2016 7:22 AM

## 2016-11-13 LAB — HEMOGLOBIN A1C
HEMOGLOBIN A1C: 5.3 % (ref 4.8–5.6)
Mean Plasma Glucose: 105 mg/dL

## 2016-12-05 NOTE — Telephone Encounter (Signed)
Error

## 2016-12-08 ENCOUNTER — Encounter (HOSPITAL_COMMUNITY): Payer: Self-pay

## 2016-12-08 ENCOUNTER — Emergency Department (HOSPITAL_COMMUNITY)
Admission: EM | Admit: 2016-12-08 | Discharge: 2016-12-08 | Disposition: A | Discharge status: Home / Self Care | Payer: Medicaid Other | Attending: Emergency Medicine | Admitting: Emergency Medicine

## 2016-12-08 ENCOUNTER — Emergency Department (HOSPITAL_COMMUNITY): Payer: Medicaid Other

## 2016-12-08 DIAGNOSIS — I10 Essential (primary) hypertension: Secondary | ICD-10-CM | POA: Insufficient documentation

## 2016-12-08 DIAGNOSIS — J189 Pneumonia, unspecified organism: Secondary | ICD-10-CM | POA: Diagnosis not present

## 2016-12-08 DIAGNOSIS — Z87891 Personal history of nicotine dependence: Secondary | ICD-10-CM | POA: Diagnosis not present

## 2016-12-08 DIAGNOSIS — F5101 Primary insomnia: Secondary | ICD-10-CM

## 2016-12-08 DIAGNOSIS — Z79899 Other long term (current) drug therapy: Secondary | ICD-10-CM | POA: Insufficient documentation

## 2016-12-08 DIAGNOSIS — R079 Chest pain, unspecified: Secondary | ICD-10-CM | POA: Diagnosis present

## 2016-12-08 LAB — BASIC METABOLIC PANEL
Anion gap: 12 (ref 5–15)
BUN: 5 mg/dL — ABNORMAL LOW (ref 6–20)
CALCIUM: 9.1 mg/dL (ref 8.9–10.3)
CO2: 25 mmol/L (ref 22–32)
CREATININE: 0.78 mg/dL (ref 0.61–1.24)
Chloride: 88 mmol/L — ABNORMAL LOW (ref 101–111)
GFR calc Af Amer: >60 mL/min (ref >60)
GLUCOSE: 201 mg/dL — AB (ref 65–99)
POTASSIUM: 4.7 mmol/L (ref 3.5–5.1)
Sodium: 125 mmol/L — ABNORMAL LOW (ref 135–145)

## 2016-12-08 LAB — CBC
HEMATOCRIT: 44.2 % (ref 39.0–52.0)
Hemoglobin: 15.1 g/dL (ref 13.0–17.0)
MCH: 31.1 pg (ref 26.0–34.0)
MCHC: 34.2 g/dL (ref 30.0–36.0)
MCV: 90.9 fL (ref 78.0–100.0)
Platelets: 129 10*3/uL — ABNORMAL LOW (ref 150–400)
RBC: 4.86 MIL/uL (ref 4.22–5.81)
RDW: 12.3 % (ref 11.5–15.5)
WBC: 11.3 10*3/uL — ABNORMAL HIGH (ref 4.0–10.5)

## 2016-12-08 LAB — I-STAT TROPONIN, ED: Troponin i, poc: 0 ng/mL (ref 0.00–0.08)

## 2016-12-08 MED ORDER — DEXTROSE 5 % IV SOLN
1.0000 g | Freq: Once | INTRAVENOUS | Status: AC
Start: 2016-12-08 — End: 2016-12-08
  Administered 2016-12-08: 1 g via INTRAVENOUS
  Filled 2016-12-08: qty 10

## 2016-12-08 MED ORDER — AZITHROMYCIN 250 MG PO TABS
500.0000 mg | ORAL_TABLET | Freq: Once | ORAL | Status: AC
  Administered 2016-12-08: 500 mg via ORAL
  Filled 2016-12-08: qty 2

## 2016-12-08 MED ORDER — AZITHROMYCIN 250 MG PO TABS: 250.0000 mg | ORAL_TABLET | Freq: Every day | ORAL | 0 refills | Status: DC

## 2016-12-08 MED ORDER — LORAZEPAM 1 MG PO TABS
1.0000 mg | ORAL_TABLET | Freq: Once | ORAL | Status: DC
  Filled 2016-12-08: qty 1

## 2016-12-08 MED ORDER — LORAZEPAM 1 MG PO TABS
1.0000 mg | ORAL_TABLET | Freq: Once | ORAL | Status: AC
  Administered 2016-12-08: 1 mg via ORAL

## 2016-12-08 NOTE — ED Notes (Signed)
Patient at xray

## 2016-12-08 NOTE — ED Notes (Signed)
Upon this RN and Alecia RN going to discharge the patient he got very upset saying that he knew "it was going to end bad" pt is very upset that he cannot get his sleep medication. EDP has gone over the situation multiple times that he needs to see his primary doctor so they can properly follow up with him. Patient continues to be upset saying that he cannot live like this, yelling out at staff. EDP brought to bedside and again explained to the patient that he needs to go see his primary doctor today and agreed to give him something for his anxiety to take here. Patient called primary doctor while this RN was in the room and made an appointment for today. Pt continues to yell at staff, security close by. Pt escorted out in a wheelchair by ed staff and security. Patient agrees to go to his appointment today.

## 2016-12-08 NOTE — ED Provider Notes (Signed)
[facility] DEPT Provider Note   CSN: [id number] Arrival date & time: 12/08/16  1287     History   Chief Complaint Chief Complaint  Patient presents with  . Chest Pain  . Anxiety    HPI LUCY WOOLEVER is a 61 y.o. male.  The history is provided by the patient. No language interpreter was used.  Chest Pain   This is a new problem. Episode onset: 3 days. The problem occurs constantly. The problem has been gradually worsening. The pain is associated with coughing. The pain is mild. The pain does not radiate. Associated symptoms include cough. Pertinent negatives include no exertional chest pressure. He has tried nothing for the symptoms. The treatment provided no relief. Risk factors include male gender.  His past medical history is significant for hypertension.  Anxiety  Associated symptoms include chest pain.  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   Past Medical History:  Diagnosis Date  . Alcohol abuse, in remission   . Anemia   . Anxiety   . Chronic pain   . Colitis   . Depression   . Esophagitis   . Gastritis   . GERD (gastroesophageal reflux disease)   . Hypertension   . IBS (irritable bowel syndrome)   . Poor dentition     Patient Active Problem List   Diagnosis Date Noted  . SOB (shortness of breath)   . Chest pain 11/11/2016  . Anxiety   . Gastroesophageal reflux disease   . Osteopenia determined by x-ray 08/06/2014  . Stress fracture of calcaneus 08/06/2014  . Vitamin D deficiency 12/18/2013  . Dementia 12/18/2013  . Benign microscopic hematuria 07/06/2013  . SIADH (syndrome of inappropriate ADH production) ([place]) 06/22/2013  . Ankylosing spondylitis ([place]) 06/20/2013  . Malnutrition of moderate degree ([place]) 06/20/2013  . Depression with anxiety 05/31/2013  . BPH (benign prostatic hyperplasia) 08/22/2010  . Backache 04/19/2010  . ALCOHOLISM 08/13/2008  . History of colonic polyps 08/13/2008  . Essential hypertension  07/03/2007  . PUD (peptic ulcer disease) 07/03/2007  . Irritable bowel syndrome 07/03/2007    Past Surgical History:  Procedure Laterality Date  . COLONOSCOPY W/ BIOPSIES    . ESOPHAGOGASTRODUODENOSCOPY    . HERNIA REPAIR Bilateral   . TONSILLECTOMY         Home Medications    Prior to Admission medications   Medication Sig Start Date End Date Taking? Authorizing Provider  atorvastatin (LIPITOR) 20 MG tablet Take 1 tablet (20 mg total) by mouth daily at 6 PM. 11/12/16   Archie Patten, MD  busPIRone (BUSPAR) 5 MG tablet Take 1 tablet (5 mg total) by mouth 2 (two) times daily. 11/12/16   Archie Patten, MD  Cholecalciferol (VITAMIN D) 2000 units CAPS Take 4,000 Units by mouth daily.     [provider]  ferrous sulfate 325 (65 FE) MG tablet Take 325 mg by mouth daily with breakfast.    [provider]  HYDROcodone-acetaminophen (NORCO/VICODIN) 5-325 MG tablet Take 1 tablet by mouth 3 (three) times daily as needed for moderate pain.  11/06/16   [provider]  hydrOXYzine (ATARAX/VISTARIL) 25 MG tablet Take 1 tablet (25 mg total) by mouth every 6 (six) hours. 11/08/16   Dorie Rank, MD  lisinopril (PRINIVIL,ZESTRIL) 10 MG tablet Take 10 mg by mouth daily. 09/05/16   [provider]  LORazepam (ATIVAN) 0.5 MG tablet Take 1 tablet (0.5 mg total) by mouth at bedtime. 11/12/16 11/12/17  Archie Patten, MD  pantoprazole (PROTONIX) 40 MG tablet Take 1 tablet (40 mg total) by mouth daily. 04/24/14   Janith Lima, MD  sucralfate (CARAFATE) 1 G tablet Take 1 tablet (1 g total) by mouth 4 (four) times daily -  with meals and at bedtime. Chew before swallowing 12/17/13   Janith Lima, MD  verapamil (CALAN-SR) 240 MG CR tablet Take one tablet by mouth at bedtime 08/25/14   [provider]  zolpidem (AMBIEN) 10 MG tablet Take 10 mg by mouth at bedtime. 08/10/16   [provider]    Family History Family History  Problem Relation Age of Onset   . Anxiety disorder Mother   . Congestive Heart Failure Mother   . Crohn's disease Mother   . Arthritis Father   . High blood pressure Father     Social History Social History  Substance Use Topics  . Smoking status: Former Smoker    Types: Cigarettes  . Smokeless tobacco: Never Used  . Alcohol use Yes     Comment: "four drinks last night for my nerves.      Allergies   Diphenhydramine hcl; Flexeril [cyclobenzaprine]; and Nsaids   Review of Systems Review of Systems  Respiratory: Positive for cough.   Cardiovascular: Positive for chest pain.  All other systems reviewed and are negative.    Physical Exam Updated Vital Signs BP (!) 173/78   Pulse 88   Temp 98.1 F (36.7 C) (Oral)   Resp 14   Ht 5' 11"  (1.803 m)   Wt 87.1 kg (192 lb)   SpO2 98%   BMI 26.78 kg/m   Physical Exam  Constitutional: He appears well-developed and well-nourished.  HENT:  Head: Normocephalic and atraumatic.  Eyes: Conjunctivae are normal.  Neck: Neck supple.  Cardiovascular: Normal rate and regular rhythm.   No murmur heard. Pulmonary/Chest: Effort normal and breath sounds normal. No respiratory distress.  Abdominal: Soft. There is no tenderness.  Musculoskeletal: He exhibits no edema.  Neurological: He is alert.  Skin: Skin is warm and dry.  Psychiatric:  Anxious   Nursing note and vitals reviewed.    ED Treatments / Results  Labs (all labs ordered are listed, but only abnormal results are displayed) Labs Reviewed  BASIC METABOLIC PANEL - Abnormal; Notable for the following:       Result Value   Sodium 125 (*)    Chloride 88 (*)    Glucose, Bld 201 (*)    BUN 5 (*)    All other components within normal limits  CBC - Abnormal; Notable for the following:    WBC 11.3 (*)    Platelets 129 (*)    All other components within normal limits  I-STAT TROPOININ, ED    EKG  EKG Interpretation  Date/Time:  Friday December 08 2016 07:57:25 EDT Ventricular Rate:  89 PR  Interval:  156 QRS Duration: 96 QT Interval:  362 QTC Calculation: 440 R Axis:   9 Text Interpretation:  Normal sinus rhythm T wave abnormality Abnormal ekg Confirmed by Carmin Muskrat [phone]) on 12/08/2016 8:09:26 AM       Radiology Dg Chest 2 View  Result Date: 12/08/2016 CLINICAL DATA:  Intermittent chest pain EXAM: CHEST  2 VIEW COMPARISON:  11/11/2016 FINDINGS: Patchy right upper lobe opacities, new, suspicious for pneumonia. No pleural effusion or pneumothorax. The heart is normal in size. Degenerative changes of the visualized thoracolumbar spine. IMPRESSION: Patchy right upper lobe opacities, new, suspicious for pneumonia.  Electronically Signed   By: Julian Hy M.D.   On: 12/08/2016 08:38    Procedures Procedures (including critical care time)  Medications Ordered in ED Medications  cefTRIAXone (ROCEPHIN) 1 g in dextrose 5 % 50 mL IVPB (not administered)  azithromycin (ZITHROMAX) tablet 500 mg (not administered)     Initial Impression / Assessment and Plan / ED Course  I have reviewed the triage vital signs and the nursing notes.  Pertinent labs & imaging results that were available during my care of the patient were reviewed by me and considered in my medical decision making (see chart for details).     Pt very anxious about his medications.   Pt reports he has not slept in 3 days.  Pt given atarax here 3 days ago.  Pt here 4 times previously with lost, stolen, dropped in sink issues with his medications.  Pt is followed by Dr. Nancy Fetter.  Pt states he can not be seen until next week.  Final Clinical Impressions(s) / ED Diagnoses   Final diagnoses:  Community acquired pneumonia, unspecified laterality    New Prescriptions New Prescriptions   AZITHROMYCIN (ZITHROMAX) 250 MG TABLET    Take 1 tablet (250 mg total) by mouth daily. Take first 2 tablets together, then 1 every day until finished.   Pt given IV Rocephin and zithromax.  Pt advised to see his MD for recheck  next week.  Pt became very anxious when he was informed ED could not give Rx for his controlled medications.  Pt given ativan 24m here.  Pt is advised to call his MD.     SSidney Ace07/06/18 01959An After Visit Summary was printed and given to the patient.   SFransico Meadow PVermont07/06/18 1029    LCarmin Muskrat MD 12/08/16 [phone]

## 2016-12-08 NOTE — ED Notes (Signed)
EDP at bedside  

## 2016-12-08 NOTE — ED Notes (Signed)
Pt refused to sign discharge papers

## 2016-12-08 NOTE — Discharge Instructions (Signed)
See your Physician for recheck on Monday

## 2016-12-08 NOTE — ED Triage Notes (Signed)
Per Pt, Pt is coming from home with complaints of left-sided chest pain, SOB, headache, and insomnia for three days. 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 Pt has hx of anxiety. Macular rash noted on his arms and abdomen.

## 2016-12-13 ENCOUNTER — Emergency Department (HOSPITAL_COMMUNITY): Payer: Medicaid Other

## 2016-12-13 ENCOUNTER — Encounter (HOSPITAL_COMMUNITY): Payer: Self-pay | Admitting: Emergency Medicine

## 2016-12-13 ENCOUNTER — Other Ambulatory Visit: Payer: Self-pay

## 2016-12-13 ENCOUNTER — Inpatient Hospital Stay (HOSPITAL_COMMUNITY)
Admission: EM | Admit: 2016-12-13 | Discharge: 2016-12-14 | DRG: 640 | Disposition: A | Discharge status: Home / Self Care | Payer: Medicaid Other | Attending: Family Medicine | Admitting: Family Medicine

## 2016-12-13 DIAGNOSIS — E559 Vitamin D deficiency, unspecified: Secondary | ICD-10-CM | POA: Diagnosis present

## 2016-12-13 DIAGNOSIS — Z87891 Personal history of nicotine dependence: Secondary | ICD-10-CM

## 2016-12-13 DIAGNOSIS — E860 Dehydration: Secondary | ICD-10-CM

## 2016-12-13 DIAGNOSIS — Z79899 Other long term (current) drug therapy: Secondary | ICD-10-CM

## 2016-12-13 DIAGNOSIS — Z888 Allergy status to other drugs, medicaments and biological substances status: Secondary | ICD-10-CM | POA: Diagnosis not present

## 2016-12-13 DIAGNOSIS — E871 Hypo-osmolality and hyponatremia: Secondary | ICD-10-CM | POA: Diagnosis present

## 2016-12-13 DIAGNOSIS — Z599 Problem related to housing and economic circumstances, unspecified: Secondary | ICD-10-CM

## 2016-12-13 DIAGNOSIS — F1011 Alcohol abuse, in remission: Secondary | ICD-10-CM | POA: Diagnosis present

## 2016-12-13 DIAGNOSIS — I10 Essential (primary) hypertension: Secondary | ICD-10-CM | POA: Diagnosis present

## 2016-12-13 DIAGNOSIS — E785 Hyperlipidemia, unspecified: Secondary | ICD-10-CM | POA: Diagnosis present

## 2016-12-13 DIAGNOSIS — F419 Anxiety disorder, unspecified: Secondary | ICD-10-CM | POA: Diagnosis present

## 2016-12-13 DIAGNOSIS — K219 Gastro-esophageal reflux disease without esophagitis: Secondary | ICD-10-CM | POA: Diagnosis present

## 2016-12-13 DIAGNOSIS — E872 Acidosis: Secondary | ICD-10-CM | POA: Diagnosis present

## 2016-12-13 DIAGNOSIS — F1010 Alcohol abuse, uncomplicated: Secondary | ICD-10-CM | POA: Insufficient documentation

## 2016-12-13 DIAGNOSIS — F329 Major depressive disorder, single episode, unspecified: Secondary | ICD-10-CM | POA: Diagnosis present

## 2016-12-13 DIAGNOSIS — J189 Pneumonia, unspecified organism: Secondary | ICD-10-CM | POA: Diagnosis present

## 2016-12-13 DIAGNOSIS — Z8719 Personal history of other diseases of the digestive system: Secondary | ICD-10-CM

## 2016-12-13 DIAGNOSIS — F32A Depression, unspecified: Secondary | ICD-10-CM | POA: Insufficient documentation

## 2016-12-13 DIAGNOSIS — G8929 Other chronic pain: Secondary | ICD-10-CM | POA: Diagnosis present

## 2016-12-13 DIAGNOSIS — Z8711 Personal history of peptic ulcer disease: Secondary | ICD-10-CM | POA: Diagnosis not present

## 2016-12-13 LAB — CBC WITH DIFFERENTIAL/PLATELET
BASOS ABS: 0.1 10*3/uL (ref 0.0–0.1)
Basophils Relative: 1 %
EOS ABS: 0.1 10*3/uL (ref 0.0–0.7)
Eosinophils Relative: 1 %
HEMATOCRIT: 42.5 % (ref 39.0–52.0)
Hemoglobin: 15.1 g/dL (ref 13.0–17.0)
LYMPHS ABS: 1.3 10*3/uL (ref 0.7–4.0)
Lymphocytes Relative: 14 %
MCH: 31.2 pg (ref 26.0–34.0)
MCHC: 35.5 g/dL (ref 30.0–36.0)
MCV: 87.8 fL (ref 78.0–100.0)
MONOS PCT: 10 %
Monocytes Absolute: 0.9 10*3/uL (ref 0.1–1.0)
NEUTROS ABS: 7 10*3/uL (ref 1.7–7.7)
Neutrophils Relative %: 74 %
Platelets: 338 10*3/uL (ref 150–400)
RBC: 4.84 MIL/uL (ref 4.22–5.81)
RDW: 12.2 % (ref 11.5–15.5)
WBC: 9.4 10*3/uL (ref 4.0–10.5)

## 2016-12-13 LAB — URINALYSIS, ROUTINE W REFLEX MICROSCOPIC
Bilirubin Urine: NEGATIVE
GLUCOSE, UA: NEGATIVE mg/dL
Ketones, ur: NEGATIVE mg/dL
Leukocytes, UA: NEGATIVE
Nitrite: NEGATIVE
PH: 8 (ref 5.0–8.0)
Protein, ur: NEGATIVE mg/dL
SPECIFIC GRAVITY, URINE: 1.003 — AB (ref 1.005–1.030)
Squamous Epithelial / LPF: NONE SEEN

## 2016-12-13 LAB — I-STAT TROPONIN, ED: TROPONIN I, POC: 0 ng/mL (ref 0.00–0.08)

## 2016-12-13 LAB — COMPREHENSIVE METABOLIC PANEL
ALBUMIN: 3.8 g/dL (ref 3.5–5.0)
ALT: 26 U/L (ref 17–63)
ANION GAP: 11 (ref 5–15)
AST: 28 U/L (ref 15–41)
Alkaline Phosphatase: 91 U/L (ref 38–126)
BILIRUBIN TOTAL: 0.8 mg/dL (ref 0.3–1.2)
BUN: 6 mg/dL (ref 6–20)
CO2: 25 mmol/L (ref 22–32)
Calcium: 9 mg/dL (ref 8.9–10.3)
Chloride: 82 mmol/L — ABNORMAL LOW (ref 101–111)
Creatinine, Ser: 0.69 mg/dL (ref 0.61–1.24)
GFR calc non Af Amer: >60 mL/min (ref >60)
GLUCOSE: 113 mg/dL — AB (ref 65–99)
POTASSIUM: 4.1 mmol/L (ref 3.5–5.1)
Sodium: 118 mmol/L — CL (ref 135–145)
TOTAL PROTEIN: 7.1 g/dL (ref 6.5–8.1)

## 2016-12-13 LAB — OSMOLALITY, URINE: Osmolality, Ur: 84 mOsm/kg — ABNORMAL LOW (ref 300–900)

## 2016-12-13 LAB — I-STAT CG4 LACTIC ACID, ED
LACTIC ACID, VENOUS: 2.53 mmol/L — AB (ref 0.5–1.9)
LACTIC ACID, VENOUS: 1.45 mmol/L (ref 0.5–1.9)

## 2016-12-13 LAB — SODIUM, URINE, RANDOM: Sodium, Ur: 24 mmol/L

## 2016-12-13 LAB — OSMOLALITY: OSMOLALITY: 258 mosm/kg — AB (ref 275–295)

## 2016-12-13 MED ORDER — SODIUM CHLORIDE 0.9 % IV SOLN
INTRAVENOUS | Status: AC
  Administered 2016-12-13 – 2016-12-14 (×2): via INTRAVENOUS

## 2016-12-13 MED ORDER — ACETAMINOPHEN 325 MG PO TABS: 650.0000 mg | ORAL_TABLET | Freq: Four times a day (QID) | ORAL | Status: DC | PRN

## 2016-12-13 MED ORDER — ONDANSETRON HCL 4 MG/2ML IJ SOLN: 4.0000 mg | Freq: Four times a day (QID) | INTRAMUSCULAR | Status: DC | PRN

## 2016-12-13 MED ORDER — LORAZEPAM 1 MG PO TABS
1.0000 mg | ORAL_TABLET | Freq: Four times a day (QID) | ORAL | Status: DC | PRN
  Administered 2016-12-13 – 2016-12-14 (×3): 1 mg via ORAL
  Filled 2016-12-13 (×3): qty 1

## 2016-12-13 MED ORDER — LISINOPRIL-HYDROCHLOROTHIAZIDE 10-12.5 MG PO TABS: 1.0000 | ORAL_TABLET | Freq: Every day | ORAL | Status: DC

## 2016-12-13 MED ORDER — ADULT MULTIVITAMIN W/MINERALS CH
1.0000 | ORAL_TABLET | Freq: Every day | ORAL | Status: DC
  Administered 2016-12-13 – 2016-12-14 (×2): 1 via ORAL
  Filled 2016-12-13 (×2): qty 1

## 2016-12-13 MED ORDER — HYDROCODONE-ACETAMINOPHEN 5-325 MG PO TABS
1.0000 | ORAL_TABLET | Freq: Once | ORAL | Status: AC
  Administered 2016-12-13: 1 via ORAL
  Filled 2016-12-13: qty 1

## 2016-12-13 MED ORDER — HYDROCODONE-ACETAMINOPHEN 5-325 MG PO TABS
1.0000 | ORAL_TABLET | Freq: Three times a day (TID) | ORAL | Status: DC | PRN
  Administered 2016-12-14 (×2): 1 via ORAL
  Filled 2016-12-13 (×2): qty 1

## 2016-12-13 MED ORDER — VITAMIN B-1 100 MG PO TABS
100.0000 mg | ORAL_TABLET | Freq: Every day | ORAL | Status: DC
  Administered 2016-12-13 – 2016-12-14 (×2): 100 mg via ORAL
  Filled 2016-12-13 (×2): qty 1

## 2016-12-13 MED ORDER — VITAMIN D3 25 MCG (1000 UNIT) PO TABS
4000.0000 [IU] | ORAL_TABLET | Freq: Every day | ORAL | Status: DC
  Administered 2016-12-13 – 2016-12-14 (×2): 4000 [IU] via ORAL
  Filled 2016-12-13 (×4): qty 4

## 2016-12-13 MED ORDER — AZITHROMYCIN 500 MG PO TABS
250.0000 mg | ORAL_TABLET | Freq: Every day | ORAL | Status: DC
  Administered 2016-12-14: 250 mg via ORAL
  Filled 2016-12-13: qty 1

## 2016-12-13 MED ORDER — TRAMADOL HCL 50 MG PO TABS
50.0000 mg | ORAL_TABLET | Freq: Four times a day (QID) | ORAL | Status: DC | PRN
  Administered 2016-12-14: 50 mg via ORAL
  Filled 2016-12-13: qty 1

## 2016-12-13 MED ORDER — FERROUS SULFATE 325 (65 FE) MG PO TABS
325.0000 mg | ORAL_TABLET | Freq: Every day | ORAL | Status: DC
  Administered 2016-12-14: 325 mg via ORAL
  Filled 2016-12-13: qty 1

## 2016-12-13 MED ORDER — ATORVASTATIN CALCIUM 20 MG PO TABS: 20.0000 mg | ORAL_TABLET | Freq: Every day | ORAL | Status: DC

## 2016-12-13 MED ORDER — VERAPAMIL HCL ER 240 MG PO TBCR
240.0000 mg | EXTENDED_RELEASE_TABLET | Freq: Every day | ORAL | Status: DC
  Administered 2016-12-14: 240 mg via ORAL
  Filled 2016-12-13 (×3): qty 1

## 2016-12-13 MED ORDER — ZOLPIDEM TARTRATE 5 MG PO TABS
10.0000 mg | ORAL_TABLET | Freq: Every day | ORAL | Status: DC
  Administered 2016-12-13: 10 mg via ORAL
  Filled 2016-12-13: qty 2

## 2016-12-13 MED ORDER — ONDANSETRON HCL 4 MG PO TABS: 4.0000 mg | ORAL_TABLET | Freq: Four times a day (QID) | ORAL | Status: DC | PRN

## 2016-12-13 MED ORDER — FOLIC ACID 1 MG PO TABS
1.0000 mg | ORAL_TABLET | Freq: Every day | ORAL | Status: DC
  Administered 2016-12-13 – 2016-12-14 (×2): 1 mg via ORAL
  Filled 2016-12-13 (×2): qty 1

## 2016-12-13 MED ORDER — HYDROXYZINE HCL 25 MG PO TABS
25.0000 mg | ORAL_TABLET | Freq: Four times a day (QID) | ORAL | Status: DC
  Administered 2016-12-13 – 2016-12-14 (×3): 25 mg via ORAL
  Filled 2016-12-13 (×3): qty 1

## 2016-12-13 MED ORDER — PANTOPRAZOLE SODIUM 40 MG PO TBEC
40.0000 mg | DELAYED_RELEASE_TABLET | Freq: Every day | ORAL | Status: DC
  Administered 2016-12-13: 40 mg via ORAL
  Filled 2016-12-13: qty 1

## 2016-12-13 MED ORDER — LORAZEPAM 2 MG/ML IJ SOLN: 1.0000 mg | Freq: Four times a day (QID) | INTRAMUSCULAR | Status: DC | PRN

## 2016-12-13 MED ORDER — ACETAMINOPHEN 650 MG RE SUPP
650.0000 mg | Freq: Four times a day (QID) | RECTAL | Status: DC | PRN
Start: 2016-12-13 — End: 2016-12-14

## 2016-12-13 MED ORDER — LORAZEPAM 2 MG/ML IJ SOLN
0.5000 mg | Freq: Once | INTRAMUSCULAR | Status: AC
  Administered 2016-12-13: 0.5 mg via INTRAVENOUS
  Filled 2016-12-13: qty 1

## 2016-12-13 MED ORDER — LISINOPRIL 10 MG PO TABS
10.0000 mg | ORAL_TABLET | Freq: Every day | ORAL | Status: DC
  Administered 2016-12-14: 10 mg via ORAL
  Filled 2016-12-13: qty 1

## 2016-12-13 MED ORDER — THIAMINE HCL 100 MG/ML IJ SOLN: 100.0000 mg | Freq: Every day | INTRAMUSCULAR | Status: DC

## 2016-12-13 MED ORDER — ENOXAPARIN SODIUM 40 MG/0.4ML ~~LOC~~ SOLN
40.0000 mg | SUBCUTANEOUS | Status: DC
  Administered 2016-12-13: 40 mg via SUBCUTANEOUS
  Filled 2016-12-13: qty 0.4

## 2016-12-13 MED ORDER — ENSURE ENLIVE PO LIQD
237.0000 mL | Freq: Two times a day (BID) | ORAL | Status: DC
  Administered 2016-12-14: 237 mL via ORAL

## 2016-12-13 MED ORDER — SUCRALFATE 1 G PO TABS
1.0000 g | ORAL_TABLET | Freq: Three times a day (TID) | ORAL | Status: DC
  Administered 2016-12-13 – 2016-12-14 (×3): 1 g via ORAL
  Filled 2016-12-13 (×3): qty 1

## 2016-12-13 MED ORDER — HYDROCHLOROTHIAZIDE 12.5 MG PO CAPS
12.5000 mg | ORAL_CAPSULE | Freq: Every day | ORAL | Status: DC
  Administered 2016-12-14: 12.5 mg via ORAL
  Filled 2016-12-13: qty 1

## 2016-12-13 MED ORDER — SODIUM CHLORIDE 0.9 % IV BOLUS (SEPSIS)
1000.0000 mL | Freq: Once | INTRAVENOUS | Status: AC
  Administered 2016-12-13: 1000 mL via INTRAVENOUS

## 2016-12-13 NOTE — H&P (Signed)
[facility] Admission History and Physical Service Pager: [phone]  Patient name: Anthony Lamb Medical record number: [id number] Date of birth: [birth year] Age: 61 y.o. Gender: male  Primary Care Provider: Sandi Mariscal, MD Consultants: None  Code Status: FULL   Chief Complaint: weakness , hyponatremia   Assessment and Plan: Anthony Lamb is a 61 y.o. male presenting with weakness. PMH is significant for alcohol abuse, in remission, anxiety, chronic pain, HTN, and IBS.   #weakness, fatigue likely 2/2 poor po intake.  Pt complaining of generalized weakness.  Recently seen in ED on 7/6 and diagnosed with RUL PNA.  At that time, given CTX x1 in ED and prescribed a course of AZT.  Has completed course of AZT except for last dose which is tomorrow.  Has not felt well in the interim and has had decreased po intake over the past week.   With elevated blood pressures on arrival, however he reports compliance with home antihypertensives.  Labs notable for Na 118, Cl 82, I-stat troponin neg, LA 2.53. Given NS bolus for elevated LA.  UA with rare bacteria and small hgb but negative for nitrites and LE.  Chest x-ray shows resolving/nearly resolved RUL infiltrates. Pt given Vicodin x1 in ED for comfort.  -Admit to med-surg, attending Dr. Nori Riis -s/p 1L NS bolus in ED  -IV NS @ 125 cc/hr x 12 hours ; reassess if needs additional fluids in AM  -Continue AZT (last dose is 7/12) to complete prescribed course for RUL pneumonia -AM BMET, CBC -Mag, phos -PT/OT recs  -Tylenol 650 mg Q6 PRN -Zofran 4 mg Q6 PRN  -vitals per unit routine   #Hyponatremia.  On admission with Na 118 (BL 125-128).   -Will correct slowly to avoid osmotic demyelination syndrome  -IV NS @125  cc.hr x 12 hours -Recheck Na on 6 AM BMET   #Lactic acidosis, resolved.  Initial LA 2.53.  Improved to 1.45 with fluid bolus.   -Will continue to monitor   #Alcohol abuse.  Reports he recently has been under a  lot of stress with his home foreclosed and started drinking again to help with the anxiety.  Last drink was last night, had 3-4 light beers.  Has been drinking every night for the past couple of weeks.  -Place on CIWA protocol  -Monitor scores  #HTN.  BP on admission 168/78.  Reports good compliance with antihypertensives.  -Monitor blood pressures   #HLD.  At home on Lipitor 20 mg daily. -Continue home Lipitor   H/o PUD and colitis.  At home on Protonix and sucralfate.  Currently stable without symptoms. -Continue home medications  #Vitamin D deficiency. At home on 4,000 U daily -Continue vit D supplementation   #Depression/Anxiety.  Taking Ambien for sleep.   Is not on medications for his depression.  States he feels down due to his current stress but no active SI/HI.  Has been drinking more to help deal with his nerves.  -Continue Ambien  -Continue home Atarax    FEN/GI: IV NS @125  cc.hr x 12 hours, Regular diet  Prophylaxis: Lovenox , Protonix  Disposition: Admit to med-surg, attending Dr. Nori Riis   History of Present Illness:  Anthony Lamb is a 60 y.o. male presenting with weakness.  Was recently seen in ED on 7/6 and diagnosed with a pneumonia.  Prescribed Azithromycin however he has been feeling worse and weak.  Reports sweating all day however denies fevers.  Has not been eating or drinking well  at home.  His home recently was foreclosed and he has been very anxious about this which he feels is the cause of his lack of appetite.  He must move next week and signed a lease this AM for an apartment.  He felt very stressed and came in to the ED.  Denies fevers, congestion, nausea, vomiting.  Endorses cough, productive of thick green sputum. Denies trouble with urinating or changes in bowel habits.  He has one more dose of his azithromycin left which will be tomorrow morning.  He notes he has been drinking more often lately due to his anxiety.  His last drink was last night when he had  3-4 light beers.  He notes he has been drinking every day for the past couple of weeks.    Review Of Systems: Per HPI with the following additions:   Review of Systems  Constitutional: Negative for chills, diaphoresis and fever.  HENT: Negative for congestion, sinus pain and sore throat.   Respiratory: Positive for cough and sputum production. Negative for shortness of breath and wheezing.   Cardiovascular: Negative for chest pain and leg swelling.  Gastrointestinal: Negative for abdominal pain, constipation, diarrhea, nausea and vomiting.  Genitourinary: Negative for dysuria, frequency and urgency.  Musculoskeletal: Positive for back pain.  Neurological: Positive for weakness.   Patient Active Problem List   Diagnosis Date Noted  . Dehydration 12/13/2016  . Alcohol abuse   . Depression   . SOB (shortness of breath)   . Chest pain 11/11/2016  . Anxiety   . Gastroesophageal reflux disease   . Osteopenia determined by x-ray 08/06/2014  . Stress fracture of calcaneus 08/06/2014  . Vitamin D deficiency 12/18/2013  . Dementia 12/18/2013  . Benign microscopic hematuria 07/06/2013  . 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 Hyponatremia 06/20/2013  . Ankylosing spondylitis ([place]) 06/20/2013  . Malnutrition of moderate degree ([place]) 06/20/2013  . Depression with anxiety 05/31/2013  . BPH (benign prostatic hyperplasia) 08/22/2010  . Backache 04/19/2010  . ALCOHOLISM 08/13/2008  . History of colonic polyps 08/13/2008  . Essential hypertension 07/03/2007  . PUD (peptic ulcer disease) 07/03/2007  . Irritable bowel syndrome 07/03/2007   Past Medical History: Past Medical History:  Diagnosis Date  . Alcohol abuse, in remission   . Anemia   . Anxiety   . Chronic pain   . Colitis   . Depression   . Esophagitis   . Gastritis   . GERD (gastroesophageal reflux disease)   . Hypertension   . IBS (irritable bowel syndrome)   . Poor dentition    Past  Surgical History: Past Surgical History:  Procedure Laterality Date  . COLONOSCOPY W/ BIOPSIES    . ESOPHAGOGASTRODUODENOSCOPY    . HERNIA REPAIR Bilateral   . TONSILLECTOMY     Social History: Social History  Substance Use Topics  . Smoking status: Former Smoker    Types: Cigarettes  . Smokeless tobacco: Never Used  . Alcohol use Yes     Comment: "four drinks last night for my nerves.    Additional social history:  Former tobacco use, quit 4 years ago, used to smoke 1/2 ppd x20 years.  Denies drug use.  Alcohol use 3-4 beers daily with last drink 7/10.  Lives at home by himself but home foreclosed for back taxes.  Please also refer to relevant sections of EMR.  Family History: Family History  Problem Relation Age of Onset  . Anxiety disorder Mother   .  Congestive Heart Failure Mother   . Crohn's disease Mother   . Arthritis Father   . High blood pressure Father    Allergies and Medications: Allergies  Allergen Reactions  . Diphenhydramine Hcl Other (See Comments)    Restlessness  . Flexeril [Cyclobenzaprine] Other (See Comments)    Restlessness  . Nsaids Other (See Comments)    GI upset   No current facility-administered medications on file prior to encounter.    Current Outpatient Prescriptions on File Prior to Encounter  Medication Sig Dispense Refill  . atorvastatin (LIPITOR) 20 MG tablet Take 1 tablet (20 mg total) by mouth daily at 6 PM. 30 tablet 0  . azithromycin (ZITHROMAX) 250 MG tablet Take 1 tablet (250 mg total) by mouth daily. Take first 2 tablets together, then 1 every day until finished. 6 tablet 0  . Cholecalciferol (VITAMIN D) 2000 units CAPS Take 4,000 Units by mouth daily.     . ferrous sulfate 325 (65 FE) MG tablet Take 325 mg by mouth daily with breakfast.    . HYDROcodone-acetaminophen (NORCO/VICODIN) 5-325 MG tablet Take 1 tablet by mouth 3 (three) times daily as needed for moderate pain.   0  . hydrOXYzine (ATARAX/VISTARIL) 25 MG tablet Take 1  tablet (25 mg total) by mouth every 6 (six) hours. 20 tablet 0  . pantoprazole (PROTONIX) 40 MG tablet Take 1 tablet (40 mg total) by mouth daily. (Patient taking differently: Take 40 mg by mouth at bedtime. ) 30 tablet 11  . sucralfate (CARAFATE) 1 G tablet Take 1 tablet (1 g total) by mouth 4 (four) times daily -  with meals and at bedtime. Chew before swallowing 120 tablet 5  . verapamil (CALAN-SR) 240 MG CR tablet Take one tablet by mouth at bedtime  3  . zolpidem (AMBIEN) 10 MG tablet Take 10 mg by mouth at bedtime.  0  . busPIRone (BUSPAR) 5 MG tablet Take 1 tablet (5 mg total) by mouth 2 (two) times daily. (Patient not taking: Reported on 12/13/2016) 60 tablet 0  . LORazepam (ATIVAN) 0.5 MG tablet Take 1 tablet (0.5 mg total) by mouth at bedtime. (Patient not taking: Reported on 12/13/2016) 10 tablet 0   Objective: BP 106/64 (BP Location: Left Arm)   Pulse 64   Temp 98.6 F (37 C)   Resp 18   SpO2 97%  Exam: General: 61 yo M, no acute distress, disheveled appearing  Eyes: EOMI, PERRL ENTM: NCAT, MMM, o/p clear, poor dentition  Neck: supple Cardiovascular: RRR no MRG noted  Respiratory: CTAB, WOB is comfortable, no wheeze noted  Gastrointestinal: soft, NTND, +bs  MSK: normal ROM  Ext: no edema or tenderness   Derm: skin is warm and dry, no diaphoresis Neuro: AOx4, normal tone, no focal deficits, 5/5 strength in upper and LE bilaterally, sensation intact  Psych: normal mood and affect   Labs and Imaging: CBC BMET   Recent Labs Lab 12/13/16 1300  WBC 9.4  HGB 15.1  HCT 42.5  PLT 338    Recent Labs Lab 12/14/16 0611  NA 128*  K 3.6  CL 93*  CO2 26  BUN <5*  CREATININE 0.61  GLUCOSE 100*  CALCIUM 8.6*     Dg Chest 2 View  Result Date: 12/13/2016 CLINICAL DATA:  Followup pneumonia. EXAM: CHEST  2 VIEW COMPARISON:  12/08/2016 FINDINGS: The cardiac silhouette, mediastinal and hilar contours are within normal limits and stable. Stable tortuosity and calcification of  the thoracic aorta. Minimal residual patchy density  in the right upper lobe, likely clearing infiltrates. No pulmonary lesions or pleural effusion. IMPRESSION: Resolving/nearly resolved right upper lobe infiltrates. Electronically Signed   By: Marijo Sanes M.D.   On: 12/13/2016 13:37   Dg Shoulder Right  Result Date: 12/13/2016 CLINICAL DATA:  Right shoulder pain, status post fall EXAM: RIGHT SHOULDER - 2+ VIEW COMPARISON:  None. FINDINGS: No fracture or dislocation is seen. The joint spaces are preserved. Visualized soft tissues are within normal limits. Visualized right lung is clear. IMPRESSION: Negative. Electronically Signed   By: Julian Hy M.D.   On: 12/13/2016 15:16    Lovenia Kim, MD 12/14/2016, 7:31 AM PGY-2, [facility] pager: [phone], text pages welcome

## 2016-12-13 NOTE — ED Notes (Signed)
Date and time results received: 12/13/16  1341 (use smartphrase ".now" to insert current time)  Test: I-stat CG4 lactic acid Critical Value: 2.53  Name of Provider Notified: [email or web address] Orders Received? Or Actions Taken?: none at this time, pt will be moved to next room

## 2016-12-13 NOTE — ED Notes (Signed)
Meal given to patient 

## 2016-12-13 NOTE — ED Notes (Signed)
Admitting at bedside 

## 2016-12-13 NOTE — ED Provider Notes (Signed)
MSE was initiated and I personally evaluated the patient and placed orders (if any) at  2:38 PM on December 13, 2016.  The patient appears stable so that the remainder of the MSE may be completed by another provider.  Blood pressure (!) 178/104, pulse 87, temperature 97.9 F (36.6 C), temperature source Oral, SpO2 97 %.  Anthony Lamb is a 61 y.o. male complaining of generalized weakness, right shoulder pain status post syncopal event 5 days ago. He was recently seen and diagnosed with pneumonia. He states that he doesn't feel any better. He endorses left-sided chest pain. He denies cough and reports frequent sweats, subjective fever. Lactic acid is mildly elevated at 2.5. Will be given fluid bolus. His blood pressure is elevated he states that he's been compliant with 2 blood pressure medications. Chest x-ray shows improving infiltrates. Patient given Vicodin for comfort.   [name] 12/13/16 1444    Mackuen, Fredia Sorrow, MD 12/15/16 2236

## 2016-12-13 NOTE — ED Notes (Signed)
PT wheeled to room, changed into a gown and given blanket. PT has a rash noted to body pt states he has had this for one week, it started prior to starting antibiotics for pneumonia.

## 2016-12-13 NOTE — ED Provider Notes (Signed)
[facility] DEPT Provider Note   CSN: [id number] Arrival date & time: 12/13/16  1240     History   Chief Complaint Chief Complaint  Patient presents with  . Weakness    HPI Anthony Lamb is a 61 y.o. male.  HPI  61 year old male who presents with weakness and fatigue. History of alcohol abuse, in remission, anxiety, chronic pain, HTN, and IBS. Seen in ED 5 days ago for cough and fatigue. Diagnosed with RUL pneumonia. Received IV ceftriaxone and discharged with azithromycin. States that he has one more dose of azithromycin left. Still feels very fatigued and weak. Has had decreased appetite during this time. No significant cough or shortness of breath. Having nausea but no vomiting or diarrhea. No fevers but having occasional night sweats and subjective fevers. Mild dysuria and urinary frequency/urgency noted over the past several days as well. States that he just for close on his house, has been very anxious. States that his anxiety has been keeping him from eating or drinking much recently. Also complains of a diffuse rash over the past few weeks, prior to starting antibiotics. States that he has had similar rash before in the setting of stress. No new detergents, lotions, soaps or other new exposures. No recent hiking or tick bites. No new medications other than the azithromycin.  Past Medical History:  Diagnosis Date  . Alcohol abuse, in remission   . Anemia   . Anxiety   . Chronic pain   . Colitis   . Depression   . Esophagitis   . Gastritis   . GERD (gastroesophageal reflux disease)   . Hypertension   . IBS (irritable bowel syndrome)   . Poor dentition     Patient Active Problem List   Diagnosis Date Noted  . SOB (shortness of breath)   . Chest pain 11/11/2016  . Anxiety   . Gastroesophageal reflux disease   . Osteopenia determined by x-ray 08/06/2014  . Stress fracture of calcaneus 08/06/2014  . Vitamin D deficiency 12/18/2013  . Dementia 12/18/2013  .  Benign microscopic hematuria 07/06/2013  . SIADH (syndrome of inappropriate ADH production) ([place]) 06/22/2013  . Ankylosing spondylitis ([place]) 06/20/2013  . Malnutrition of moderate degree ([place]) 06/20/2013  . Depression with anxiety 05/31/2013  . BPH (benign prostatic hyperplasia) 08/22/2010  . Backache 04/19/2010  . ALCOHOLISM 08/13/2008  . History of colonic polyps 08/13/2008  . Essential hypertension 07/03/2007  . PUD (peptic ulcer disease) 07/03/2007  . Irritable bowel syndrome 07/03/2007    Past Surgical History:  Procedure Laterality Date  . COLONOSCOPY W/ BIOPSIES    . ESOPHAGOGASTRODUODENOSCOPY    . HERNIA REPAIR Bilateral   . TONSILLECTOMY         Home Medications    Prior to Admission medications   Medication Sig Start Date End Date Taking? Authorizing Provider  atorvastatin (LIPITOR) 20 MG tablet Take 1 tablet (20 mg total) by mouth daily at 6 PM. 11/12/16   Archie Patten, MD  azithromycin (ZITHROMAX) 250 MG tablet Take 1 tablet (250 mg total) by mouth daily. Take first 2 tablets together, then 1 every day until finished. 12/08/16   Fransico Meadow, PA-C  busPIRone (BUSPAR) 5 MG tablet Take 1 tablet (5 mg total) by mouth 2 (two) times daily. 11/12/16   Archie Patten, MD  Cholecalciferol (VITAMIN D) 2000 units CAPS Take 4,000 Units by mouth daily.     [provider]  ferrous sulfate 325 (65 FE) MG tablet Take  325 mg by mouth daily with breakfast.    [provider]  HYDROcodone-acetaminophen (NORCO/VICODIN) 5-325 MG tablet Take 1 tablet by mouth 3 (three) times daily as needed for moderate pain.  11/06/16   [provider]  hydrOXYzine (ATARAX/VISTARIL) 25 MG tablet Take 1 tablet (25 mg total) by mouth every 6 (six) hours. 11/08/16   Dorie Rank, MD  lisinopril (PRINIVIL,ZESTRIL) 10 MG tablet Take 10 mg by mouth daily. 09/05/16   [provider]  LORazepam (ATIVAN) 0.5 MG tablet Take 1 tablet (0.5 mg total) by mouth at bedtime. 11/12/16  11/12/17  Archie Patten, MD  pantoprazole (PROTONIX) 40 MG tablet Take 1 tablet (40 mg total) by mouth daily. 04/24/14   Janith Lima, MD  sucralfate (CARAFATE) 1 G tablet Take 1 tablet (1 g total) by mouth 4 (four) times daily -  with meals and at bedtime. Chew before swallowing 12/17/13   Janith Lima, MD  verapamil (CALAN-SR) 240 MG CR tablet Take one tablet by mouth at bedtime 08/25/14   [provider]  zolpidem (AMBIEN) 10 MG tablet Take 10 mg by mouth at bedtime. 08/10/16   [provider]    Family History Family History  Problem Relation Age of Onset  . Anxiety disorder Mother   . Congestive Heart Failure Mother   . Crohn's disease Mother   . Arthritis Father   . High blood pressure Father     Social History Social History  Substance Use Topics  . Smoking status: Former Smoker    Types: Cigarettes  . Smokeless tobacco: Never Used  . Alcohol use Yes     Comment: "four drinks last night for my nerves.      Allergies   Diphenhydramine hcl; Flexeril [cyclobenzaprine]; and Nsaids   Review of Systems Review of Systems  Constitutional: Positive for fatigue.  Respiratory: Negative for cough and shortness of breath.   Gastrointestinal: Negative for abdominal pain.  Genitourinary: Positive for dysuria.  Neurological: Positive for light-headedness. Negative for syncope.  All other systems reviewed and are negative.    Physical Exam Updated Vital Signs BP (!) 178/104 (BP Location: Right Arm)   Pulse 87   Temp 97.9 F (36.6 C) (Oral)   SpO2 97%   Physical Exam Physical Exam  Nursing note and vitals reviewed. Constitutional: Well developed, well nourished, non-toxic, and in no acute distress Head: Normocephalic and atraumatic.  Mouth/Throat: Oropharynx is clear. Mildly dry mucous membranes  Neck: Normal range of motion. Neck supple.  Cardiovascular: Normal rate and regular rhythm.   Pulmonary/Chest: Effort normal and breath sounds normal.    Abdominal: Soft. There is no tenderness. There is no rebound and no guarding.  Musculoskeletal: Normal range of motion.  Neurological: Alert, no facial droop, fluent speech, moves all extremities symmetrically Skin: Skin is warm and dry.  Psychiatric: Cooperative   ED Treatments / Results  Labs (all labs ordered are listed, but only abnormal results are displayed) Labs Reviewed  COMPREHENSIVE METABOLIC PANEL - Abnormal; Notable for the following:       Result Value   Sodium 118 (*)    Chloride 82 (*)    Glucose, Bld 113 (*)    All other components within normal limits  I-STAT CG4 LACTIC ACID, ED - Abnormal; Notable for the following:    Lactic Acid, Venous 2.53 (*)    All other components within normal limits  CBC WITH DIFFERENTIAL/PLATELET  URINALYSIS, ROUTINE W REFLEX MICROSCOPIC  OSMOLALITY, URINE  SODIUM, URINE,  RANDOM  OSMOLALITY  I-STAT TROPOININ, ED  I-STAT CG4 LACTIC ACID, ED    EKG  EKG Interpretation None       Radiology Dg Chest 2 View  Result Date: 12/13/2016 CLINICAL DATA:  Followup pneumonia. EXAM: CHEST  2 VIEW COMPARISON:  12/08/2016 FINDINGS: The cardiac silhouette, mediastinal and hilar contours are within normal limits and stable. Stable tortuosity and calcification of the thoracic aorta. Minimal residual patchy density in the right upper lobe, likely clearing infiltrates. No pulmonary lesions or pleural effusion. IMPRESSION: Resolving/nearly resolved right upper lobe infiltrates. Electronically Signed   By: Marijo Sanes M.D.   On: 12/13/2016 13:37   Dg Shoulder Right  Result Date: 12/13/2016 CLINICAL DATA:  Right shoulder pain, status post fall EXAM: RIGHT SHOULDER - 2+ VIEW COMPARISON:  None. FINDINGS: No fracture or dislocation is seen. The joint spaces are preserved. Visualized soft tissues are within normal limits. Visualized right lung is clear. IMPRESSION: Negative. Electronically Signed   By: Julian Hy M.D.   On: 12/13/2016 15:16     Procedures Procedures (including critical care time) CRITICAL CARE Performed by: Forde Dandy   Total critical care time: 35 minutes  Critical care time was exclusive of separately billable procedures and treating other patients.  Critical care was necessary to treat or prevent imminent or life-threatening deterioration.  Critical care was time spent personally by me on the following activities: development of treatment plan with patient and/or surrogate as well as nursing, discussions with consultants, evaluation of patient's response to treatment, examination of patient, obtaining history from patient or surrogate, ordering and performing treatments and interventions, ordering and review of laboratory studies, ordering and review of radiographic studies, pulse oximetry and re-evaluation of patient's condition.  Medications Ordered in ED Medications  sodium chloride 0.9 % bolus 1,000 mL (1,000 mLs Intravenous New Bag/Given 12/13/16 1639)  HYDROcodone-acetaminophen (NORCO/VICODIN) 5-325 MG per tablet 1 tablet (1 tablet Oral Given 12/13/16 1638)  LORazepam (ATIVAN) injection 0.5 mg (0.5 mg Intravenous Given 12/13/16 1638)     Initial Impression / Assessment and Plan / ED Course  I have reviewed the triage vital signs and the nursing notes.  Pertinent labs & imaging results that were available during my care of the patient were reviewed by me and considered in my medical decision making (see chart for details).     Presenting with fatigue and weakness in the setting of recent treatment for community acquired pneumonia. He is afebrile, nontoxic in no acute distress. His pneumonia appears to be improving on visualization of the chest x-ray. Without significant increased work of breathing or hypoxia. Blood work is concerning for acute on chronic hyponatremia with a sodium of 118. This is felt to be hypovolemic in nature as he endorses decreased appetite and decreased by mouth intake over  the past several days. He did receive 1 L of IV fluids. Mentating normally, but hyponatremia likely causing fatigue and weakness. Plan to admit for ongoing management.   Final Clinical Impressions(s) / ED Diagnoses   Final diagnoses:  Hyponatremia    New Prescriptions New Prescriptions   No medications on file     Forde Dandy, MD 12/13/16 1705

## 2016-12-13 NOTE — ED Notes (Signed)
CMET must be redrawn, grossly hemolyzed per lab

## 2016-12-13 NOTE — ED Notes (Signed)
Patient transported to X-ray 

## 2016-12-13 NOTE — ED Triage Notes (Addendum)
Dx with pneumonia last week and was given meds but pt states he is not feeling better  And he is breaking out in rash but states he has had the rash before due to stress, states he sweats a lot and he fell and injuried his rt shoulder

## 2016-12-14 DIAGNOSIS — E871 Hypo-osmolality and hyponatremia: Principal | ICD-10-CM

## 2016-12-14 LAB — BASIC METABOLIC PANEL
Anion gap: 9 (ref 5–15)
CALCIUM: 8.6 mg/dL — AB (ref 8.9–10.3)
CO2: 26 mmol/L (ref 22–32)
Chloride: 93 mmol/L — ABNORMAL LOW (ref 101–111)
Creatinine, Ser: 0.61 mg/dL (ref 0.61–1.24)
GFR calc Af Amer: >60 mL/min (ref >60)
GLUCOSE: 100 mg/dL — AB (ref 65–99)
POTASSIUM: 3.6 mmol/L (ref 3.5–5.1)
SODIUM: 128 mmol/L — AB (ref 135–145)

## 2016-12-14 LAB — PHOSPHORUS: Phosphorus: 2.8 mg/dL (ref 2.5–4.6)

## 2016-12-14 LAB — MAGNESIUM: Magnesium: 1.8 mg/dL (ref 1.7–2.4)

## 2016-12-14 MED ORDER — AMLODIPINE BESYLATE 5 MG PO TABS: 5.0000 mg | ORAL_TABLET | Freq: Every day | ORAL | 0 refills | Status: DC

## 2016-12-14 MED ORDER — SALINE SPRAY 0.65 % NA SOLN
1.0000 | NASAL | Status: DC | PRN
  Administered 2016-12-14: 1 via NASAL
  Filled 2016-12-14: qty 44

## 2016-12-14 MED ORDER — THIAMINE HCL 100 MG PO TABS: 100.0000 mg | ORAL_TABLET | Freq: Every day | ORAL | 0 refills | Status: DC

## 2016-12-14 MED ORDER — FOLIC ACID 1 MG PO TABS: 1.0000 mg | ORAL_TABLET | Freq: Every day | ORAL | 0 refills | Status: DC

## 2016-12-14 NOTE — Progress Notes (Signed)
Anthony Lamb to be D/C'd to home per MD order.  Discussed with the patient and all questions fully answered.  VSS, Skin clean, dry and intact without evidence of skin break down, no evidence of skin tears noted. IV catheter discontinued intact. Site without signs and symptoms of complications. Dressing and pressure applied.  An After Visit Summary was printed and given to the patient. Patient received prescriptions.  D/c education completed with patient/family including follow up instructions, medication list, d/c activities limitations if indicated, with other d/c instructions as indicated by MD - patient able to verbalize understanding, all questions fully answered.   Patient instructed to return to ED, call 911, or call MD for any changes in condition.   Patient escorted via Anthony Lamb, and D/C home via private auto.  Anthony Lamb 12/14/2016 3:58 PM

## 2016-12-14 NOTE — Evaluation (Signed)
Physical Therapy Evaluation Patient Details Name: Anthony Lamb MRN: [id number] DOB: [birth year] Today's Date: 12/14/2016   History of Present Illness  Pt admitted with dehydration, weakness and hyponatremia. Pt reports increased alcohol consumption and not eating. PMH: ankylosing spondylitis, recent PNA, HTN, depression and anxiety, IBS, alcohol abuse  Clinical Impression  Pt is close to baseline functioning and should be safe at home with limited assist. There are no further acute PT needs.  Will sign off at this time.     Follow Up Recommendations No PT follow up    Equipment Recommendations  None recommended by PT    Recommendations for Other Services       Precautions / Restrictions Precautions Precautions: None      Mobility  Bed Mobility Overal bed mobility: Independent                Transfers Overall transfer level: Modified independent Equipment used: None                Ambulation/Gait Ambulation/Gait assistance: Modified independent (Device/Increase time);Independent Ambulation Distance (Feet): 300 Feet Assistive device: None (vs IV pole) Gait Pattern/deviations: Step-through pattern   Gait velocity interpretation: Below normal speed for age/gender General Gait Details: generally steady and slower with noticeable fatigue and mild overheating.  Stairs            Wheelchair Mobility    Modified Rankin (Stroke Patients Only)       Balance Overall balance assessment: No apparent balance deficits (not formally assessed)                                           Pertinent Vitals/Pain Pain Assessment: Faces Faces Pain Scale: Hurts even more Pain Location: R shoulder Pain Descriptors / Indicators: Sore Pain Intervention(s): Premedicated before session    Home Living Family/patient expects to be discharged to:: Private residence Living Arrangements: Alone Available Help at Discharge: Friend(s);Available  PRN/intermittently Type of Home: House Home Access: Stairs to enter   Entrance Stairs-Number of Steps: 3 Home Layout: One level Home Equipment: Cane - single point (was his mother's)      Prior Function Level of Independence: Independent         Comments: does not drive, uses cabs or a friend takes him     Hand Dominance   Dominant Hand: Right    Extremity/Trunk Assessment   Upper Extremity Assessment Upper Extremity Assessment: Defer to OT evaluation RUE Deficits / Details: soreness and limited shoulder FF and abd, xray negative for fx RUE: Unable to fully assess due to pain RUE Coordination: decreased gross motor    Lower Extremity Assessment Lower Extremity Assessment: Overall WFL for tasks assessed;RLE deficits/detail;LLE deficits/detail RLE Deficits / Details: R weaker than L at 4/5 for hip flexion and quads, 4- hams. LLE Deficits / Details: grossly >4/5    Cervical / Trunk Assessment Cervical / Trunk Assessment: Other exceptions Cervical / Trunk Exceptions: ankylosing spondylitis, forward head  Communication   Communication: No difficulties  Cognition Arousal/Alertness: Awake/alert Behavior During Therapy: Flat affect Overall Cognitive Status: Within Functional Limits for tasks assessed                                 General Comments: pt distracted by need for MD to write prescription for Lorrin Mais, sent text page to MD  General Comments      Exercises     Assessment/Plan    PT Assessment Patent does not need any further PT services  PT Problem List Decreased activity tolerance;Decreased strength       PT Treatment Interventions      PT Goals (Current goals can be found in the Care Plan section)  Acute Rehab PT Goals Patient Stated Goal: to get some ambien for home PT Goal Formulation: All assessment and education complete, DC therapy    Frequency     Barriers to discharge        Co-evaluation                AM-PAC PT "6 Clicks" Daily Activity  Outcome Measure Difficulty turning over in bed (including adjusting bedclothes, sheets and blankets)?: None Difficulty moving from lying on back to sitting on the side of the bed? : None Difficulty sitting down on and standing up from a chair with arms (e.g., wheelchair, bedside commode, etc,.)?: None Help needed moving to and from a bed to chair (including a wheelchair)?: None Help needed walking in hospital room?: None Help needed climbing 3-5 steps with a railing? : A Little 6 Click Score: 23    End of Session   Activity Tolerance: Patient tolerated treatment well Patient left: in chair;with call bell/phone within reach;with chair alarm set Nurse Communication: Mobility status PT Visit Diagnosis: Muscle weakness (generalized) (M62.81)    Time: 1464-3142 PT Time Calculation (min) (ACUTE ONLY): 14 min   Charges:   PT Evaluation $PT Eval Moderate Complexity: 1 Procedure     PT G Codes:        01/09/2017  Donnella Sham, PT [phone]  (pager)  Tessie Fass Kaysin Brock 2017/01/09, 4:35 PM

## 2016-12-14 NOTE — Discharge Summary (Signed)
[facility] Discharge Summary  Patient name: Anthony Lamb Medical record number: [id number] Date of birth: [birth year] Age: 61 y.o. Gender: male Date of Admission: 12/13/2016  Date of Discharge: 12/14/16 Admitting Physician: Dickie La, MD  Primary Care Provider: Sandi Mariscal, MD Consultants: None  Indication for Hospitalization: weakness and hyponatremia  Discharge Diagnoses/Problem List:  Anxiety and Depression Alcohol abuse HTN HLD PUD and colitis Vitamin D deficiency   Disposition: Home  Discharge Condition: stable, improving  Discharge Exam: please see progress note from day of discharge  Brief Hospital Course:  Mr. Rittenhouse was admitted on 7/11 for weakness and hyponatremia.  His sodium was found to be 118 on admission, which is lower than his baseline hyponatremia of 125-128.  He reported that he has been under a lot of stress due to having to move out of his house and into a new apartment and has started drinking again, about 3-4 beers nightly.  He was given a 1L NS bolus in the ED and started on an infusion of NS @ 125 mL/h for 12 hours.  Sodium improved to 128 at 0600 on 12/14/16.  He received his last dose of azithromycin for previously diagnosed and improving RUL pneumonia on 12/14/16.  He began to feel better on 12/14/16 in the morning and was discharged after tolerating a PO diet that morning.  Issues for Follow Up:  1. Alcoholism - patient was advised to go to [facility] or rehab for this, not interested at this time 2. Anxiety and Depression - please follow up with outpatient physician for this; patient declines SSRIs or Buspar at this time 3. HTN - continue home medications 4. HLD - continue home medications  Significant Procedures: none  Significant Labs and Imaging:   Recent Labs Lab 12/08/16 0815 12/13/16 1300  WBC 11.3* 9.4  HGB 15.1 15.1  HCT 44.2 42.5  PLT 129* 338    Recent Labs Lab 12/08/16 0815 12/13/16 1533  12/13/16 2235 12/14/16 0611  NA 125* 118*  --  128*  K 4.7 4.1  --  3.6  CL 88* 82*  --  93*  CO2 25 25  --  26  GLUCOSE 201* 113*  --  100*  BUN 5* 6  --  <5*  CREATININE 0.78 0.69  --  0.61  CALCIUM 9.1 9.0  --  8.6*  MG  --   --  1.8  --   PHOS  --   --  2.8  --   ALKPHOS  --  91  --   --   AST  --  28  --   --   ALT  --  26  --   --   ALBUMIN  --  3.8  --   --       Results/Tests Pending at Time of Discharge: none  Discharge Medications:  Allergies as of 12/14/2016      Reactions   Diphenhydramine Hcl Other (See Comments)   Restlessness   Flexeril [cyclobenzaprine] Other (See Comments)   Restlessness   Nsaids Other (See Comments)   GI upset      Medication List    STOP taking these medications   azithromycin 250 MG tablet Commonly known as:  ZITHROMAX   busPIRone 5 MG tablet Commonly known as:  BUSPAR   lisinopril-hydrochlorothiazide 10-12.5 MG tablet Commonly known as:  PRINZIDE,ZESTORETIC     TAKE these medications   amLODipine 5 MG tablet Commonly known as:  NORVASC Take  1 tablet (5 mg total) by mouth daily.   atorvastatin 20 MG tablet Commonly known as:  LIPITOR Take 1 tablet (20 mg total) by mouth daily at 6 PM.   ferrous sulfate 325 (65 FE) MG tablet Take 325 mg by mouth daily with breakfast.   folic acid 1 MG tablet Commonly known as:  FOLVITE Take 1 tablet (1 mg total) by mouth daily.   HYDROcodone-acetaminophen 5-325 MG tablet Commonly known as:  NORCO/VICODIN Take 1 tablet by mouth 3 (three) times daily as needed for moderate pain.   hydrOXYzine 25 MG tablet Commonly known as:  ATARAX/VISTARIL Take 1 tablet (25 mg total) by mouth every 6 (six) hours.   LORazepam 0.5 MG tablet Commonly known as:  ATIVAN Take 1 tablet (0.5 mg total) by mouth at bedtime.   pantoprazole 40 MG tablet Commonly known as:  PROTONIX Take 1 tablet (40 mg total) by mouth daily. What changed:  when to take this   sucralfate 1 g tablet Commonly known  as:  CARAFATE Take 1 tablet (1 g total) by mouth 4 (four) times daily -  with meals and at bedtime. Chew before swallowing   thiamine 100 MG tablet Take 1 tablet (100 mg total) by mouth daily.   traMADol 50 MG tablet Commonly known as:  ULTRAM Take 50 mg by mouth as needed for pain. back   triamcinolone 0.025 % cream Commonly known as:  KENALOG Apply 1 application topically as needed (rash on his leg).   verapamil 240 MG CR tablet Commonly known as:  CALAN-SR Take one tablet by mouth at bedtime   Vitamin D 2000 units Caps Take 4,000 Units by mouth daily.   zolpidem 10 MG tablet Commonly known as:  AMBIEN Take 10 mg by mouth at bedtime.       Discharge Instructions: Please refer to Patient Instructions section of EMR for full details.  Patient was counseled important signs and symptoms that should prompt return to medical care, changes in medications, dietary instructions, activity restrictions, and follow up appointments.   Follow-Up Appointments: Please make an appointment with your primary doctor.  Kathrene Alu, MD 12/14/2016, 2:52 PM PGY-1, [place]

## 2016-12-14 NOTE — Progress Notes (Signed)
Nutrition Brief Note  Patient identified on the Malnutrition Screening Tool (MST) Report  Wt Readings from Last 15 Encounters:  12/08/16 192 lb (87.1 kg)  11/12/16 187 lb 11.2 oz (85.1 kg)  10/18/16 198 lb (89.8 kg)  10/10/16 198 lb (89.8 kg)  09/25/16 195 lb (88.5 kg)  09/09/16 195 lb (88.5 kg)  06/20/16 205 lb (93 kg)  03/18/16 198 lb (89.8 kg)  11/11/14 205 lb (93 kg)  10/14/14 205 lb 1.9 oz (93 kg)  08/06/14 197 lb (89.4 kg)  04/24/14 184 lb (83.5 kg)  12/17/13 151 lb 12 oz (68.8 kg)  07/02/13 162 lb (73.5 kg)  06/28/13 160 lb (72.6 kg)   TADEO BESECKER is a 61 y.o. male presenting with weakness. PMH is significant for alcohol abuse, in remission, anxiety, chronic pain, HTN, and IBS.   Pt reports slight wt loss over the past 3 weeks, related to stress. He shares that he has been dealing with the sale of his foreclosed home, while relocating to his new apartment. He estimates he consumed 2 meals per day. He consumed about 50-75% of his breakfast this morning.   Pt shares UBW is around 198#. No significant wt changes over the past year.   Pt shares that he is awaiting dental extractions and dentures later on this year. Due to poor dentition it is difficult for him to chew foods and becomes fatigued with increased effort chewing. Pt amenable to diet downgrade (dysphagia 2).   Nutrition-Focused physical exam completed. Findings are no fat depletion, no muscle depletion, and no edema.   Estimated body mass index is 26.78 kg/m as calculated from the following:   Height as of 12/08/16: 5' 11"  (1.803 m).   Weight as of 12/08/16: 192 lb (87.1 kg). Patient meets criteria for overweight based on current BMI.   Current diet order is regular, patient is consuming approximately 50%% of meals at this time. Labs and medications reviewed.   No further nutrition interventions warranted at this time. If nutrition issues arise, please consult RD.   Jeanenne Licea A. Jimmye Norman, RD, LDN, CDE Pager:  [phone] After hours Pager: [phone]

## 2016-12-14 NOTE — Discharge Instructions (Signed)
Stop your lisinopril-HCTZ and start Norvasc for your blood pressure.  Keep taking Thiamine and Folate.  Schedule an appointment with your primary doctor in one week.

## 2016-12-14 NOTE — Evaluation (Signed)
Occupational Therapy Evaluation Patient Details Name: Anthony Lamb MRN: [id number] DOB: 1956/01/14 Today's Date: 12/14/2016    History of Present Illness Pt admitted with dehydration, weakness and hyponatremia. Pt reports increased alcohol consumption and not eating. PMH: ankylosing spondylitis, recent PNA, HTN, depression and anxiety, IBS, alcohol abuse   Clinical Impression   Pt is performing ADL and ADL transfers modified independently. He was noted to reach for surfaces with ambulation, but reports this is his baseline.     Follow Up Recommendations  No OT follow up    Equipment Recommendations  None recommended by OT    Recommendations for Other Services       Precautions / Restrictions Precautions Precautions: None      Mobility Bed Mobility Overal bed mobility: Independent                Transfers Overall transfer level: Modified independent Equipment used: None                  Balance                                           ADL either performed or assessed with clinical judgement   ADL Overall ADL's : At baseline                                       General ADL Comments: Pt demonstrating ability to perform self care and ADL transfers at a modified independent level.     Vision Patient Visual Report: No change from baseline       Perception     Praxis      Pertinent Vitals/Pain Pain Assessment: Faces Faces Pain Scale: Hurts even more Pain Location: R shoulder Pain Descriptors / Indicators: Sore Pain Intervention(s): Premedicated before session;Repositioned     Hand Dominance Right   Extremity/Trunk Assessment Upper Extremity Assessment Upper Extremity Assessment: RUE deficits/detail RUE Deficits / Details: soreness and limited shoulder FF and abd, xray negative for fx RUE: Unable to fully assess due to pain RUE Coordination: decreased gross motor   Lower Extremity Assessment Lower  Extremity Assessment: Defer to PT evaluation   Cervical / Trunk Assessment Cervical / Trunk Assessment: Other exceptions Cervical / Trunk Exceptions: ankylosing spondylitis, forward head   Communication Communication Communication: No difficulties   Cognition Arousal/Alertness: Awake/alert Behavior During Therapy: Flat affect Overall Cognitive Status: Within Functional Limits for tasks assessed                                 General Comments: pt distracted by need for MD to write prescription for Lorrin Mais, sent text page to MD   General Comments       Exercises     Shoulder Instructions      Home Living Family/patient expects to be discharged to:: Private residence Living Arrangements: Alone Available Help at Discharge: Friend(s);Available PRN/intermittently Type of Home: House Home Access: Stairs to enter [employer] of Steps: 3   Home Layout: One level     Bathroom Shower/Tub: Occupational psychologist: Standard     Home Equipment: Cane - single point (was his mother's)          Prior Functioning/Environment Level of Independence:  Independent        Comments: does not drive, uses cabs or a friend takes him        OT Problem List:        OT Treatment/Interventions:      OT Goals(Current goals can be found in the care plan section) Acute Rehab OT Goals Patient Stated Goal: to get some ambien for home  OT Frequency:     Barriers to D/C:            Co-evaluation              AM-PAC PT "6 Clicks" Daily Activity     Outcome Measure Help from another person eating meals?: None Help from another person taking care of personal grooming?: None Help from another person toileting, which includes using toliet, bedpan, or urinal?: None Help from another person bathing (including washing, rinsing, drying)?: None Help from another person to put on and taking off regular upper body clothing?: None Help from another  person to put on and taking off regular lower body clothing?: None 6 Click Score: 24   End of Session Equipment Utilized During Treatment: Gait belt  Activity Tolerance: Patient tolerated treatment well Patient left:  (walking with PT)  OT Visit Diagnosis: Muscle weakness (generalized) (M62.81);Pain                Time: 1345-1404 OT Time Calculation (min): 19 min Charges:  OT General Charges $OT Visit: 1 Procedure OT Evaluation $OT Eval Low Complexity: 1 Procedure G-Codes:      Anthony Lamb 12/14/2016, 3:24 PM  [phone]

## 2016-12-14 NOTE — Progress Notes (Signed)
Family Medicine Teaching Service Daily Progress Note Intern Pager: [phone]  Patient name: Anthony Lamb Medical record number: [id number] Date of birth: Dec 10, 1955 Age: 61 y.o. Gender: male  Primary Care Provider: Sandi Mariscal, MD Consultants: None Code Status: FULL  Pt Overview and Major Events to Date:  Anthony Lamb was admitted for weakness and hyponatremia.  He had recently been diagnosed with RUL pneumonia and has one more dose of azithromycin left before finishing the course.  He reported that he had been drinking more alcohol lately due to stress.  His sodium was found to be 118 on admission and lactic acid was 2.53, so he was started on a 1L NS fluid bolus.  Lactic acid improved to 1.45.  He was put on NS @ 125 cc/hr to slowly correct his sodium.  Assessment and Plan: Anthony Lamb is a 61 y.o. male presenting with weakness. PMH is significant for alcohol abuse, in remission, anxiety, chronic pain, HTN, and IBS.   Weakness, fatigue likely 2/2 poor po intake.  Pt complaining of generalized weakness.  Recently seen in ED on 7/6 and diagnosed with RUL PNA.  At that time, given CTX x1 in ED and prescribed a course of AZT.  Has completed course of AZT except for last dose which is 12/14/16.  Has not felt well in the interim and has had decreased po intake over the past week.   With elevated blood pressures on arrival, however he reports compliance with home antihypertensives.  Labs notable for Na 118, Cl 82, I-stat troponin neg, LA 2.53. Given NS bolus for elevated LA.  UA with rare bacteria and small hgb but negative for nitrites and LE.  Chest x-ray shows resolving/nearly resolved RUL infiltrates. Pt given Vicodin x1 in ED for comfort.  Patient's sodium corrected to 128 after 12 hours of NS fluids at 125 cc/hr -reassess to see if patient needs more fluids -Continue azithromycin (last dose is 7/12) to complete prescribed course for RUL pneumonia -Mag, phos -PT/OT recs - patient does  not want PT  -Tylenol 650 mg Q6 PRN -Zofran 4 mg Q6 PRN  -vitals per unit routine   Hyponatremia.  On admission with Na 118 (BL 125-128).  Improved to 128 on 12/14/16 am BMET -no further correction needed   Lactic acidosis, resolved.  Initial LA 2.53.  Improved to 1.45 with fluid bolus.   -Will continue to monitor   Alcohol abuse.  Reports he recently has been under a lot of stress with his home foreclosed and started drinking again to help with the anxiety.  Last drink was last night, had 3-4 light beers.  Has been drinking every night for the past couple of weeks.  -Place on CIWA protocol  -Monitor scores - CIWA score of 5 12/14/16  HTN.  BP on admission 168/78.  Reports good compliance with antihypertensives. Current BP 106/64  -Monitor blood pressures   HLD.  At home on Lipitor 20 mg daily. -Continue home Lipitor   H/o PUD and colitis.  At home on Protonix and sucralfate.  Currently stable without symptoms. -Continue home medications  Vitamin D deficiency. At home on 4,000 U daily -Continue vit D supplementation   Depression/Anxiety.  Taking Ambien for sleep.   Is not on medications for his depression.  States he feels down due to his current stress but no active SI/HI.  Has been drinking more to help deal with his nerves. Declines SSRIs for now, says that they drop his sodium too low. -Continue  Ambien  -Continue home Atarax  -Encourage follow up with PCP and abstinence from alcohol    FEN/GI: Regular diet  Prophylaxis: Lovenox , Protonix   Disposition: Home  Subjective:  Anthony Lamb is anxious about moving into his apartment and wants to go home today.  He has repeatedly asked for benzodiazepines to help relieve his anxiety during the next few days as he moves to his new apartment.  He says that his strength is better now and does not want PT.  Objective: Temp:  [97.9 F (36.6 C)-98.6 F (37 C)] 98.6 F (37 C) (07/11 2157) Pulse Rate:  [64-89] 64 (07/12  0506) Resp:  [12-18] 18 (07/12 0506) BP: (106-178)/(64-104) 106/64 (07/12 0506) SpO2:  [95 %-100 %] 97 % (07/12 0033) Physical Exam: General: anxious appearing man lying in bed Cardiovascular: RRR, no MRG Respiratory: CTAB Abdomen: nontender to palpation, soft, distended Extremities: 5/5 strength in bilateral upper and lower extremities, pain in right shoulder limits ROM of RUE  Laboratory:  Recent Labs Lab 12/08/16 0815 12/13/16 1300  WBC 11.3* 9.4  HGB 15.1 15.1  HCT 44.2 42.5  PLT 129* 338    Recent Labs Lab 12/08/16 0815 12/13/16 1533  NA 125* 118*  K 4.7 4.1  CL 88* 82*  CO2 25 25  BUN 5* 6  CREATININE 0.78 0.69  CALCIUM 9.1 9.0  PROT  --  7.1  BILITOT  --  0.8  ALKPHOS  --  91  ALT  --  26  AST  --  28  GLUCOSE 201* 113*     Imaging/Diagnostic Tests: Dg Chest 2 View  Result Date: 12/13/2016 CLINICAL DATA:  Followup pneumonia. EXAM: CHEST  2 VIEW COMPARISON:  12/08/2016 FINDINGS: The cardiac silhouette, mediastinal and hilar contours are within normal limits and stable. Stable tortuosity and calcification of the thoracic aorta. Minimal residual patchy density in the right upper lobe, likely clearing infiltrates. No pulmonary lesions or pleural effusion. IMPRESSION: Resolving/nearly resolved right upper lobe infiltrates. Electronically Signed   By: Marijo Sanes M.D.   On: 12/13/2016 13:37   Dg Shoulder Right  Result Date: 12/13/2016 CLINICAL DATA:  Right shoulder pain, status post fall EXAM: RIGHT SHOULDER - 2+ VIEW COMPARISON:  None. FINDINGS: No fracture or dislocation is seen. The joint spaces are preserved. Visualized soft tissues are within normal limits. Visualized right lung is clear. IMPRESSION: Negative. Electronically Signed   By: Julian Hy M.D.   On: 12/13/2016 15:16     Kathrene Alu, MD 12/14/2016, 7:06 AM PGY-1, [place] Intern pager: [phone], text pages welcome

## 2017-01-05 ENCOUNTER — Emergency Department (HOSPITAL_COMMUNITY)
Admission: EM | Admit: 2017-01-05 | Discharge: 2017-01-05 | Disposition: A | Discharge status: Home / Self Care | Payer: Medicaid Other | Attending: Emergency Medicine | Admitting: Emergency Medicine

## 2017-01-05 ENCOUNTER — Encounter (HOSPITAL_COMMUNITY): Payer: Self-pay

## 2017-01-05 DIAGNOSIS — F1020 Alcohol dependence, uncomplicated: Secondary | ICD-10-CM | POA: Insufficient documentation

## 2017-01-05 DIAGNOSIS — I10 Essential (primary) hypertension: Secondary | ICD-10-CM | POA: Insufficient documentation

## 2017-01-05 DIAGNOSIS — Z79899 Other long term (current) drug therapy: Secondary | ICD-10-CM | POA: Insufficient documentation

## 2017-01-05 DIAGNOSIS — F419 Anxiety disorder, unspecified: Secondary | ICD-10-CM | POA: Diagnosis not present

## 2017-01-05 DIAGNOSIS — R600 Localized edema: Secondary | ICD-10-CM | POA: Diagnosis present

## 2017-01-05 DIAGNOSIS — E860 Dehydration: Secondary | ICD-10-CM | POA: Insufficient documentation

## 2017-01-05 DIAGNOSIS — Z87891 Personal history of nicotine dependence: Secondary | ICD-10-CM | POA: Diagnosis not present

## 2017-01-05 DIAGNOSIS — E871 Hypo-osmolality and hyponatremia: Secondary | ICD-10-CM | POA: Diagnosis not present

## 2017-01-05 LAB — OSMOLALITY: OSMOLALITY: 278 mosm/kg (ref 275–295)

## 2017-01-05 LAB — COMPREHENSIVE METABOLIC PANEL
ALK PHOS: 136 U/L — AB (ref 38–126)
ALT: 31 U/L (ref 17–63)
AST: 33 U/L (ref 15–41)
Albumin: 4.4 g/dL (ref 3.5–5.0)
Anion gap: 12 (ref 5–15)
BILIRUBIN TOTAL: 0.3 mg/dL (ref 0.3–1.2)
BUN: 6 mg/dL (ref 6–20)
CALCIUM: 9.4 mg/dL (ref 8.9–10.3)
CHLORIDE: 91 mmol/L — AB (ref 101–111)
CO2: 28 mmol/L (ref 22–32)
CREATININE: 0.67 mg/dL (ref 0.61–1.24)
Glucose, Bld: 108 mg/dL — ABNORMAL HIGH (ref 65–99)
Potassium: 4.3 mmol/L (ref 3.5–5.1)
Sodium: 131 mmol/L — ABNORMAL LOW (ref 135–145)
Total Protein: 8.2 g/dL — ABNORMAL HIGH (ref 6.5–8.1)

## 2017-01-05 LAB — CBC WITH DIFFERENTIAL/PLATELET
BASOS ABS: 0.1 10*3/uL (ref 0.0–0.1)
Basophils Relative: 0 %
EOS ABS: 0.1 10*3/uL (ref 0.0–0.7)
Eosinophils Relative: 1 %
HCT: 41.1 % (ref 39.0–52.0)
HEMOGLOBIN: 14.2 g/dL (ref 13.0–17.0)
LYMPHS ABS: 1.4 10*3/uL (ref 0.7–4.0)
LYMPHS PCT: 10 %
MCH: 30.7 pg (ref 26.0–34.0)
MCHC: 34.5 g/dL (ref 30.0–36.0)
MCV: 88.8 fL (ref 78.0–100.0)
Monocytes Absolute: 1.4 10*3/uL — ABNORMAL HIGH (ref 0.1–1.0)
Monocytes Relative: 10 %
NEUTROS PCT: 79 %
Neutro Abs: 11.1 10*3/uL — ABNORMAL HIGH (ref 1.7–7.7)
Platelets: 373 10*3/uL (ref 150–400)
RBC: 4.63 MIL/uL (ref 4.22–5.81)
RDW: 12.4 % (ref 11.5–15.5)
WBC: 14 10*3/uL — AB (ref 4.0–10.5)

## 2017-01-05 LAB — URINALYSIS, ROUTINE W REFLEX MICROSCOPIC
BACTERIA UA: NONE SEEN
BILIRUBIN URINE: NEGATIVE
Glucose, UA: NEGATIVE mg/dL
KETONES UR: NEGATIVE mg/dL
LEUKOCYTES UA: NEGATIVE
Nitrite: NEGATIVE
PH: 8 (ref 5.0–8.0)
Protein, ur: NEGATIVE mg/dL
SPECIFIC GRAVITY, URINE: 1.004 — AB (ref 1.005–1.030)
Squamous Epithelial / LPF: NONE SEEN

## 2017-01-05 LAB — RAPID URINE DRUG SCREEN, HOSP PERFORMED
AMPHETAMINES: NOT DETECTED
Barbiturates: NOT DETECTED
Benzodiazepines: POSITIVE — AB
Cocaine: NOT DETECTED
OPIATES: POSITIVE — AB
Tetrahydrocannabinol: NOT DETECTED

## 2017-01-05 LAB — OSMOLALITY, URINE: Osmolality, Ur: 178 mOsm/kg — ABNORMAL LOW (ref 300–900)

## 2017-01-05 LAB — ETHANOL

## 2017-01-05 MED ORDER — METOCLOPRAMIDE HCL 10 MG PO TABS: 10.0000 mg | ORAL_TABLET | Freq: Four times a day (QID) | ORAL | 0 refills | Status: DC | PRN

## 2017-01-05 MED ORDER — SODIUM CHLORIDE 0.9 % IV BOLUS (SEPSIS)
1000.0000 mL | Freq: Once | INTRAVENOUS | Status: AC
  Administered 2017-01-05: 1000 mL via INTRAVENOUS

## 2017-01-05 MED ORDER — CHLORDIAZEPOXIDE HCL 25 MG PO CAPS: ORAL_CAPSULE | ORAL | 0 refills | Status: DC

## 2017-01-05 NOTE — Discharge Instructions (Signed)
Stay hydrated.   Take reglan as needed for nausea or poor appetite.   Take librium as needed for anxiety. Don't drink alcohol with it  Your sodium is slightly low, repeat with your doctor   See your doctor next week   Return to ER if you have worse tremors, seizure, worse weakness.

## 2017-01-05 NOTE — ED Triage Notes (Signed)
Patient c/o swelling bilateral lower extremities x 6 days. Patient states he has been very anxious and has not been drinking and eating like he should. Patient also reports drinking alcohol daily because he does not have any meds for anxiety at this time.

## 2017-01-05 NOTE — ED Provider Notes (Addendum)
[facility] DEPT Provider Note   CSN: [id number] Arrival date & time: 01/05/17  1034     History   Chief Complaint Chief Complaint  Patient presents with  . Leg Swelling  . Anxiety    HPI Anthony Lamb is a 61 y.o. male history of alcohol abuse, hyponatremia from alcohol, reflux, hypertension, esophagitis here presenting with poor by mouth intake, dehydration, lower extremity swelling. Patient states that he has not been eating well for the last several weeks. He was admitted to the hospital about month ago for hyponatremia likely from alcohol use and has not been eating well since discharge. Patient also states that he has been anxious as well and has been taking Atarax with no relief. Patient also noticed some bilateral leg swelling as well but denies any chest pain or shortness of breath. Patient finished a course of antibiotics a month ago for pneumonia but denies any cough or fevers. Patient continues to drink alcohol and drinks about 3-4 beers every night.  The history is provided by the patient.    Past Medical History:  Diagnosis Date  . Alcohol abuse, in remission   . Anemia   . Anxiety   . Chronic pain   . Colitis   . Depression   . Esophagitis   . Gastritis   . GERD (gastroesophageal reflux disease)   . Hypertension   . IBS (irritable bowel syndrome)   . Poor dentition     Patient Active Problem List   Diagnosis Date Noted  . Dehydration 12/13/2016  . Alcohol abuse   . Depression   . SOB (shortness of breath)   . Chest pain 11/11/2016  . Anxiety   . Gastroesophageal reflux disease   . Osteopenia determined by x-ray 08/06/2014  . Stress fracture of calcaneus 08/06/2014  . Vitamin D deficiency 12/18/2013  . Dementia 12/18/2013  . Benign microscopic hematuria 07/06/2013  . 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 Hyponatremia 06/20/2013  . Ankylosing spondylitis ([place]) 06/20/2013  . Malnutrition of moderate degree ([place])  06/20/2013  . Depression with anxiety 05/31/2013  . BPH (benign prostatic hyperplasia) 08/22/2010  . Backache 04/19/2010  . ALCOHOLISM 08/13/2008  . History of colonic polyps 08/13/2008  . Essential hypertension 07/03/2007  . PUD (peptic ulcer disease) 07/03/2007  . Irritable bowel syndrome 07/03/2007    Past Surgical History:  Procedure Laterality Date  . COLONOSCOPY W/ BIOPSIES    . ESOPHAGOGASTRODUODENOSCOPY    . HERNIA REPAIR Bilateral   . TONSILLECTOMY         Home Medications    Prior to Admission medications   Medication Sig Start Date End Date Taking? Authorizing Provider  amLODipine (NORVASC) 5 MG tablet Take 1 tablet (5 mg total) by mouth daily. 12/14/16 12/14/17 Yes Winfrey, Alcario Drought, MD  atorvastatin (LIPITOR) 20 MG tablet Take 1 tablet (20 mg total) by mouth daily at 6 PM. 11/12/16  Yes Archie Patten, MD  Cholecalciferol (VITAMIN D) 2000 units CAPS Take 4,000 Units by mouth daily.    Yes [provider]  eszopiclone (LUNESTA) 2 MG TABS tablet Take 2 mg by mouth at bedtime. 12/25/16  Yes [provider]  ferrous sulfate 325 (65 FE) MG tablet Take 325 mg by mouth daily with breakfast.   Yes [provider]  folic acid (FOLVITE) 1 MG tablet Take 1 tablet (1 mg total) by mouth daily. 12/15/16  Yes Winfrey, Alcario Drought, MD  HYDROcodone-acetaminophen (NORCO/VICODIN) 5-325 MG tablet Take 1 tablet  by mouth 3 (three) times daily as needed for moderate pain.  11/06/16  Yes [provider]  hydrOXYzine (ATARAX/VISTARIL) 25 MG tablet Take 1 tablet (25 mg total) by mouth every 6 (six) hours. 11/08/16  Yes Dorie Rank, MD  pantoprazole (PROTONIX) 40 MG tablet Take 1 tablet (40 mg total) by mouth daily. Patient taking differently: Take 40 mg by mouth at bedtime.  04/24/14  Yes Janith Lima, MD  sucralfate (CARAFATE) 1 G tablet Take 1 tablet (1 g total) by mouth 4 (four) times daily -  with meals and at bedtime. Chew before swallowing 12/17/13  Yes Janith Lima, MD  thiamine 100 MG tablet Take 1 tablet (100 mg total) by mouth daily. 12/15/16  Yes Kathrene Alu, MD  verapamil (CALAN-SR) 240 MG CR tablet Take one tablet by mouth at bedtime 08/25/14  Yes [provider]  LORazepam (ATIVAN) 0.5 MG tablet Take 1 tablet (0.5 mg total) by mouth at bedtime. Patient not taking: Reported on 12/13/2016 11/12/16 11/12/17  Archie Patten, MD    Family History Family History  Problem Relation Age of Onset  . Anxiety disorder Mother   . Congestive Heart Failure Mother   . Crohn's disease Mother   . Arthritis Father   . High blood pressure Father     Social History Social History  Substance Use Topics  . Smoking status: Former Smoker    Types: Cigarettes  . Smokeless tobacco: Never Used  . Alcohol use Yes     Comment: daily      Allergies   Diphenhydramine hcl; Flexeril [cyclobenzaprine]; and Nsaids   Review of Systems Review of Systems  Cardiovascular: Positive for leg swelling.  Psychiatric/Behavioral: The patient is nervous/anxious.   All other systems reviewed and are negative.    Physical Exam Updated Vital Signs BP (!) 164/87 (BP Location: Left Arm)   Pulse 98   Temp 98.8 F (37.1 C) (Oral)   Resp 18   Ht 5' 10"  (1.778 m)   Wt 86.2 kg (190 lb)   SpO2 100%   BMI 27.26 kg/m   Physical Exam  Constitutional: He is oriented to person, place, and time.  Chronically ill, dehydrated   HENT:  Head: Normocephalic.  MM dry   Eyes: Pupils are equal, round, and reactive to light. Conjunctivae and EOM are normal.  Neck: Normal range of motion. Neck supple.  Cardiovascular: Normal rate, regular rhythm and normal heart sounds.   Pulmonary/Chest: Effort normal and breath sounds normal. No respiratory distress. He has no wheezes. He has no rales.  Abdominal: Soft. Bowel sounds are normal.  Slightly distended, nontender   Musculoskeletal:  1+ edema bilaterally (no calf tenderness)   Neurological: He is alert and  oriented to person, place, and time.  Skin: Skin is warm.  Psychiatric:  Slightly anxious   Nursing note and vitals reviewed.    ED Treatments / Results  Labs (all labs ordered are listed, but only abnormal results are displayed) Labs Reviewed  CBC WITH DIFFERENTIAL/PLATELET - Abnormal; Notable for the following:       Result Value   WBC 14.0 (*)    Neutro Abs 11.1 (*)    Monocytes Absolute 1.4 (*)    All other components within normal limits  COMPREHENSIVE METABOLIC PANEL - Abnormal; Notable for the following:    Sodium 131 (*)    Chloride 91 (*)    Glucose, Bld 108 (*)    Total Protein 8.2 (*)  Alkaline Phosphatase 136 (*)    All other components within normal limits  RAPID URINE DRUG SCREEN, HOSP PERFORMED - Abnormal; Notable for the following:    Opiates POSITIVE (*)    Benzodiazepines POSITIVE (*)    All other components within normal limits  URINALYSIS, ROUTINE W REFLEX MICROSCOPIC - Abnormal; Notable for the following:    Color, Urine STRAW (*)    Specific Gravity, Urine 1.004 (*)    Hgb urine dipstick SMALL (*)    All other components within normal limits  ETHANOL  OSMOLALITY  OSMOLALITY, URINE    EKG  EKG Interpretation None       Radiology No results found.  Procedures Procedures (including critical care time)  Medications Ordered in ED Medications  sodium chloride 0.9 % bolus 1,000 mL (0 mLs Intravenous Stopped 01/05/17 1346)     Initial Impression / Assessment and Plan / ED Course  I have reviewed the triage vital signs and the nursing notes.  Pertinent labs & imaging results that were available during my care of the patient were reviewed by me and considered in my medical decision making (see chart for details).     Anthony Lamb is a 61 y.o. male here with dehydration, alcohol use, anxiety. Recently admitted for hyponatremia from alcohol use. Appears dehydrated. Will check labs, osms, UA, ETOH, UDS. Will hydrate and reassess.   2:06  PM Na 131, improved from previous. ETOH neg, UDS + benzos. He was given 1 L NS bolus. Eating and drinking well in the ED. He requests something for anxiety as atarax hasn't been helping. I am concerned that he may take ativan or xanax with alcohol and may get overly sedated. Will give librium as it is longer acting and may help him detox from alcohol.   Final Clinical Impressions(s) / ED Diagnoses   Final diagnoses:  None    New Prescriptions New Prescriptions   No medications on file     Drenda Freeze, MD 01/05/17 1407    Drenda Freeze, MD 01/05/17 [phone]

## 2017-02-14 ENCOUNTER — Encounter (HOSPITAL_COMMUNITY): Payer: Self-pay

## 2017-02-14 ENCOUNTER — Emergency Department (HOSPITAL_COMMUNITY)
Admission: EM | Admit: 2017-02-14 | Discharge: 2017-02-14 | Disposition: A | Discharge status: Home / Self Care | Payer: Medicaid Other | Attending: Emergency Medicine | Admitting: Emergency Medicine

## 2017-02-14 DIAGNOSIS — Z5321 Procedure and treatment not carried out due to patient leaving prior to being seen by health care provider: Secondary | ICD-10-CM | POA: Diagnosis present

## 2017-02-14 NOTE — ED Triage Notes (Signed)
Patient states he has not been eating or drinking right because he does not have an appetite and "my nerves are not right." patient also c/o dental issues as well.

## 2017-02-15 ENCOUNTER — Emergency Department (HOSPITAL_COMMUNITY)
Admission: EM | Admit: 2017-02-15 | Discharge: 2017-02-15 | Disposition: A | Discharge status: Home / Self Care | Payer: Medicaid Other | Attending: Emergency Medicine | Admitting: Emergency Medicine

## 2017-02-15 ENCOUNTER — Encounter (HOSPITAL_COMMUNITY): Payer: Self-pay | Admitting: Emergency Medicine

## 2017-02-15 DIAGNOSIS — R638 Other symptoms and signs concerning food and fluid intake: Secondary | ICD-10-CM | POA: Diagnosis not present

## 2017-02-15 DIAGNOSIS — R197 Diarrhea, unspecified: Secondary | ICD-10-CM | POA: Diagnosis not present

## 2017-02-15 DIAGNOSIS — Z87891 Personal history of nicotine dependence: Secondary | ICD-10-CM | POA: Insufficient documentation

## 2017-02-15 DIAGNOSIS — R42 Dizziness and giddiness: Secondary | ICD-10-CM | POA: Diagnosis not present

## 2017-02-15 DIAGNOSIS — I10 Essential (primary) hypertension: Secondary | ICD-10-CM | POA: Insufficient documentation

## 2017-02-15 DIAGNOSIS — F419 Anxiety disorder, unspecified: Secondary | ICD-10-CM | POA: Insufficient documentation

## 2017-02-15 DIAGNOSIS — R5383 Other fatigue: Secondary | ICD-10-CM | POA: Diagnosis not present

## 2017-02-15 DIAGNOSIS — R51 Headache: Secondary | ICD-10-CM | POA: Insufficient documentation

## 2017-02-15 DIAGNOSIS — Z79899 Other long term (current) drug therapy: Secondary | ICD-10-CM | POA: Insufficient documentation

## 2017-02-15 DIAGNOSIS — R531 Weakness: Secondary | ICD-10-CM | POA: Diagnosis present

## 2017-02-15 LAB — CBC WITH DIFFERENTIAL/PLATELET
BASOS ABS: 0 10*3/uL (ref 0.0–0.1)
BASOS PCT: 0 %
Eosinophils Absolute: 0 10*3/uL (ref 0.0–0.7)
Eosinophils Relative: 0 %
HEMATOCRIT: 41.3 % (ref 39.0–52.0)
Hemoglobin: 14.1 g/dL (ref 13.0–17.0)
LYMPHS PCT: 10 %
Lymphs Abs: 1.4 10*3/uL (ref 0.7–4.0)
MCH: 30.5 pg (ref 26.0–34.0)
MCHC: 34.1 g/dL (ref 30.0–36.0)
MCV: 89.2 fL (ref 78.0–100.0)
Monocytes Absolute: 1.3 10*3/uL — ABNORMAL HIGH (ref 0.1–1.0)
Monocytes Relative: 9 %
NEUTROS ABS: 10.9 10*3/uL — AB (ref 1.7–7.7)
Neutrophils Relative %: 81 %
Platelets: 439 10*3/uL — ABNORMAL HIGH (ref 150–400)
RBC: 4.63 MIL/uL (ref 4.22–5.81)
RDW: 13.4 % (ref 11.5–15.5)
WBC: 13.6 10*3/uL — AB (ref 4.0–10.5)

## 2017-02-15 LAB — COMPREHENSIVE METABOLIC PANEL
ALT: 29 U/L (ref 17–63)
ANION GAP: 10 (ref 5–15)
AST: 29 U/L (ref 15–41)
Albumin: 4.4 g/dL (ref 3.5–5.0)
Alkaline Phosphatase: 114 U/L (ref 38–126)
BILIRUBIN TOTAL: 0.7 mg/dL (ref 0.3–1.2)
BUN: <5 mg/dL — ABNORMAL LOW (ref 6–20)
CALCIUM: 9.3 mg/dL (ref 8.9–10.3)
CO2: 30 mmol/L (ref 22–32)
Chloride: 90 mmol/L — ABNORMAL LOW (ref 101–111)
Creatinine, Ser: 0.69 mg/dL (ref 0.61–1.24)
Glucose, Bld: 107 mg/dL — ABNORMAL HIGH (ref 65–99)
POTASSIUM: 3.6 mmol/L (ref 3.5–5.1)
Sodium: 130 mmol/L — ABNORMAL LOW (ref 135–145)
TOTAL PROTEIN: 8.1 g/dL (ref 6.5–8.1)

## 2017-02-15 MED ORDER — SODIUM CHLORIDE 0.9 % IV BOLUS (SEPSIS)
1000.0000 mL | Freq: Once | INTRAVENOUS | Status: AC
  Administered 2017-02-15: 1000 mL via INTRAVENOUS

## 2017-02-15 MED ORDER — LORAZEPAM 1 MG PO TABS
1.0000 mg | ORAL_TABLET | Freq: Once | ORAL | Status: AC
  Administered 2017-02-15: 1 mg via ORAL
  Filled 2017-02-15: qty 1

## 2017-02-15 NOTE — ED Triage Notes (Signed)
Patient reports that he had to move out of his house into an apartment with lot of young people which has made him more nervous and anxious, so "havent been eating and drinking right so believe I am dehydrated."  Patient has water bottle on table that has few sips our of it.  Patient reports being tired and having intermittent headaches and reports that how he felt in past when was dehydrated,.

## 2017-02-15 NOTE — ED Provider Notes (Signed)
[facility] DEPT Provider Note   CSN: [id number] Arrival date & time: 02/15/17  0911     History   Chief Complaint Chief Complaint  Patient presents with  . Fatigue  . Headache    HPI Anthony Lamb is a 61 y.o. male.  HPI  61 year old male presents with a chief complaint of weakness and feeling like he is dehydrated, requesting IV fluids. He states this has happened to him multiple times. He states that he has been eating and drinking poorly. He states part of it is because he has very poor dentition and so he has a hard time chewing. But he also states he has a lot of stressors such as moving into a new apartment, anxiety, and his dad has been ill. He states that he has not been eating and drinking and not had no appetite during the last several days. He occasionally feels dizzy. Occasionally he gets a sharp headache that comes and goes as well. The symptoms come along with recurrent dehydration per him. There is no chest pain, shortness of breath area and he has a history of colitis but no current abdominal pain. He has chronic diarrhea but unchanged from baseline. No blood. No vomiting. Denies any urinary symptoms. He carries a chronic cough but denies shortness of breath or change in cough. No fevers. He states he just feels like he needs a bag of fluids and can go home. No focal weakness.  Past Medical History:  Diagnosis Date  . Alcohol abuse, in remission   . Anemia   . Anxiety   . Chronic pain   . Colitis   . Depression   . Esophagitis   . Gastritis   . GERD (gastroesophageal reflux disease)   . Hypertension   . IBS (irritable bowel syndrome)   . Poor dentition     Patient Active Problem List   Diagnosis Date Noted  . Dehydration 12/13/2016  . Alcohol abuse   . Depression   . SOB (shortness of breath)   . Chest pain 11/11/2016  . Anxiety   . Gastroesophageal reflux disease   . Osteopenia determined by x-ray 08/06/2014  . Stress fracture of calcaneus  08/06/2014  . Vitamin D deficiency 12/18/2013  . Dementia 12/18/2013  . Benign microscopic hematuria 07/06/2013  . 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 Hyponatremia 06/20/2013  . Ankylosing spondylitis ([place]) 06/20/2013  . Malnutrition of moderate degree ([place]) 06/20/2013  . Depression with anxiety 05/31/2013  . BPH (benign prostatic hyperplasia) 08/22/2010  . Backache 04/19/2010  . ALCOHOLISM 08/13/2008  . History of colonic polyps 08/13/2008  . Essential hypertension 07/03/2007  . PUD (peptic ulcer disease) 07/03/2007  . Irritable bowel syndrome 07/03/2007    Past Surgical History:  Procedure Laterality Date  . COLONOSCOPY W/ BIOPSIES    . ESOPHAGOGASTRODUODENOSCOPY    . HERNIA REPAIR Bilateral   . TONSILLECTOMY         Home Medications    Prior to Admission medications   Medication Sig Start Date End Date Taking? Authorizing Provider  Cholecalciferol (VITAMIN D) 2000 units CAPS Take 4,000 Units by mouth daily.    Yes [provider]  ferrous sulfate 325 (65 FE) MG tablet Take 325 mg by mouth daily with breakfast.   Yes [provider]  HYDROcodone-acetaminophen (NORCO/VICODIN) 5-325 MG tablet Take 1 tablet by mouth 3 (three) times daily as needed for moderate pain.  11/06/16  Yes [provider]  hydrOXYzine (ATARAX/VISTARIL) 25 MG  tablet Take 1 tablet (25 mg total) by mouth every 6 (six) hours. Patient taking differently: Take 25 mg by mouth every 6 (six) hours as needed for nausea or vomiting.  11/08/16  Yes Dorie Rank, MD  lisinopril-hydrochlorothiazide (PRINZIDE,ZESTORETIC) 10-12.5 MG tablet Take 1 tablet by mouth at bedtime.   Yes [provider]  metoCLOPramide (REGLAN) 10 MG tablet Take 1 tablet (10 mg total) by mouth every 6 (six) hours as needed for nausea (nausea/headache). 01/05/17  Yes Drenda Freeze, MD  pantoprazole (PROTONIX) 40 MG tablet Take 1 tablet (40 mg total) by mouth daily. Patient  taking differently: Take 40 mg by mouth at bedtime.  04/24/14  Yes Janith Lima, MD  sucralfate (CARAFATE) 1 G tablet Take 1 tablet (1 g total) by mouth 4 (four) times daily -  with meals and at bedtime. Chew before swallowing 12/17/13  Yes Janith Lima, MD  thiamine 100 MG tablet Take 1 tablet (100 mg total) by mouth daily. 12/15/16  Yes Kathrene Alu, MD  verapamil (CALAN-SR) 240 MG CR tablet Take one tablet by mouth at bedtime 08/25/14  Yes [provider]  zolpidem (AMBIEN) 10 MG tablet Take 10 mg by mouth at bedtime.   Yes [provider]  amLODipine (NORVASC) 5 MG tablet Take 1 tablet (5 mg total) by mouth daily. Patient not taking: Reported on 02/15/2017 12/14/16 12/14/17  Kathrene Alu, MD  atorvastatin (LIPITOR) 20 MG tablet Take 1 tablet (20 mg total) by mouth daily at 6 PM. Patient not taking: Reported on 02/15/2017 11/12/16   Archie Patten, MD  chlordiazePOXIDE (LIBRIUM) 25 MG capsule 7m PO TID x 1D, then 25-539mPO BID X 1D, then 25-5041mO QD X 1D Patient not taking: Reported on 02/15/2017 01/05/17   YaoDrenda FreezeD  folic acid (FOLVITE) 1 MG tablet Take 1 tablet (1 mg total) by mouth daily. Patient not taking: Reported on 02/15/2017 12/15/16   WinKathrene AluD  LORazepam (ATIVAN) 0.5 MG tablet Take 1 tablet (0.5 mg total) by mouth at bedtime. Patient not taking: Reported on 12/13/2016 11/12/16 11/12/17  DorArchie PattenD    Family History Family History  Problem Relation Age of Onset  . Anxiety disorder Mother   . Congestive Heart Failure Mother   . Crohn's disease Mother   . Arthritis Father   . High blood pressure Father     Social History Social History  Substance Use Topics  . Smoking status: Former Smoker    Types: Cigarettes  . Smokeless tobacco: Never Used  . Alcohol use Yes     Comment: daily/ 3-4 beers     Allergies   Diphenhydramine hcl; Flexeril [cyclobenzaprine]; and Nsaids   Review of Systems Review of  Systems  Constitutional: Positive for fatigue. Negative for fever.  Respiratory: Negative for shortness of breath.   Cardiovascular: Negative for chest pain.  Gastrointestinal: Positive for diarrhea. Negative for abdominal pain, blood in stool, nausea and vomiting.  Genitourinary: Negative for dysuria and hematuria.  Neurological: Positive for dizziness and headaches. Negative for weakness and numbness.  All other systems reviewed and are negative.    Physical Exam Updated Vital Signs BP (!) 149/74   Pulse 95   Temp 98 F (36.7 C) (Oral)   Resp 17   SpO2 100%   Physical Exam  Constitutional: He is oriented to person, place, and time. He appears well-developed and well-nourished.  HENT:  Head: Normocephalic and atraumatic.  Right Ear:  External ear normal.  Left Ear: External ear normal.  Nose: Nose normal.  Overall very poor dentition but no obvious acute dental infection or abscess  Eyes: Pupils are equal, round, and reactive to light. EOM are normal. Right eye exhibits no discharge. Left eye exhibits no discharge.  Neck: Neck supple.  Cardiovascular: Normal rate, regular rhythm and normal heart sounds.   Pulmonary/Chest: Effort normal and breath sounds normal. He has no wheezes. He has no rales.  Abdominal: Soft. There is no tenderness.  Musculoskeletal: He exhibits no edema.  Neurological: He is alert and oriented to person, place, and time.  CN 3-12 grossly intact. 5/5 strength in all 4 extremities. Grossly normal sensation. Normal finger to nose. Normal gait  Skin: Skin is warm and dry.  Nursing note and vitals reviewed.    ED Treatments / Results  Labs (all labs ordered are listed, but only abnormal results are displayed) Labs Reviewed  COMPREHENSIVE METABOLIC PANEL - Abnormal; Notable for the following:       Result Value   Sodium 130 (*)    Chloride 90 (*)    Glucose, Bld 107 (*)    BUN <5 (*)    All other components within normal limits  CBC WITH  DIFFERENTIAL/PLATELET - Abnormal; Notable for the following:    WBC 13.6 (*)    Platelets 439 (*)    Neutro Abs 10.9 (*)    Monocytes Absolute 1.3 (*)    All other components within normal limits    EKG  EKG Interpretation  Date/Time:  Thursday February 15 2017 15:33:41 EDT Ventricular Rate:  79 PR Interval:    QRS Duration: 110 QT Interval:  413 QTC Calculation: 474 R Axis:   52 Text Interpretation:  Sinus rhythm no acute ST/T changes no significant change since July 2018 Confirmed by Sherwood Gambler [phone]) on 02/15/2017 4:24:39 PM       Radiology No results found.  Procedures Procedures (including critical care time)  Medications Ordered in ED Medications  LORazepam (ATIVAN) tablet 1 mg (not administered)  sodium chloride 0.9 % bolus 1,000 mL (1,000 mLs Intravenous New Bag/Given 02/15/17 1538)     Initial Impression / Assessment and Plan / ED Course  I have reviewed the triage vital signs and the nursing notes.  Pertinent labs & imaging results that were available during my care of the patient were reviewed by me and considered in my medical decision making (see chart for details).     Patient is feeling better after a dose of IV fluids. He is drinking Sprite without difficulty. He has been encouraged to increase his fluid and oral intake. Recommended ensure given his poor dentition. He has a mild WBC elevation, no clear source of an infectious process and this has been chronic according to multiple prior labs. He also has a chronic mild hyponatremia. However his renal function is okay at think he is stable for discharge to follow up with his PCP. He is complaining of increased anxiety, specially with other things ongoing and now has status passed away. He will be given a one-time dose and encouraged to follow up with his PCP for outpatient anxiety management.  Final Clinical Impressions(s) / ED Diagnoses   Final diagnoses:  Fatigue, unspecified type  Poor fluid  intake    New Prescriptions New Prescriptions   No medications on file     Sherwood Gambler, MD 02/15/17 1645

## 2017-02-26 ENCOUNTER — Encounter (HOSPITAL_COMMUNITY): Payer: Self-pay

## 2017-02-26 ENCOUNTER — Emergency Department (HOSPITAL_COMMUNITY): Payer: Medicaid Other

## 2017-02-26 ENCOUNTER — Emergency Department (HOSPITAL_COMMUNITY)
Admission: EM | Admit: 2017-02-26 | Discharge: 2017-02-26 | Disposition: A | Discharge status: Home / Self Care | Payer: Medicaid Other | Attending: Emergency Medicine | Admitting: Emergency Medicine

## 2017-02-26 DIAGNOSIS — K589 Irritable bowel syndrome without diarrhea: Secondary | ICD-10-CM | POA: Insufficient documentation

## 2017-02-26 DIAGNOSIS — Z79899 Other long term (current) drug therapy: Secondary | ICD-10-CM | POA: Insufficient documentation

## 2017-02-26 DIAGNOSIS — R109 Unspecified abdominal pain: Secondary | ICD-10-CM | POA: Diagnosis present

## 2017-02-26 DIAGNOSIS — Z87891 Personal history of nicotine dependence: Secondary | ICD-10-CM | POA: Diagnosis not present

## 2017-02-26 DIAGNOSIS — I10 Essential (primary) hypertension: Secondary | ICD-10-CM | POA: Diagnosis not present

## 2017-02-26 DIAGNOSIS — F419 Anxiety disorder, unspecified: Secondary | ICD-10-CM | POA: Insufficient documentation

## 2017-02-26 DIAGNOSIS — G8929 Other chronic pain: Secondary | ICD-10-CM

## 2017-02-26 LAB — LIPASE, BLOOD: Lipase: 19 U/L (ref 11–51)

## 2017-02-26 LAB — URINALYSIS, ROUTINE W REFLEX MICROSCOPIC
BILIRUBIN URINE: NEGATIVE
Bacteria, UA: NONE SEEN
Glucose, UA: NEGATIVE mg/dL
Ketones, ur: NEGATIVE mg/dL
LEUKOCYTES UA: NEGATIVE
NITRITE: NEGATIVE
Protein, ur: NEGATIVE mg/dL
SPECIFIC GRAVITY, URINE: 1.017 (ref 1.005–1.030)
SQUAMOUS EPITHELIAL / LPF: NONE SEEN
WBC, UA: NONE SEEN WBC/hpf (ref 0–5)
pH: 7 (ref 5.0–8.0)

## 2017-02-26 LAB — COMPREHENSIVE METABOLIC PANEL
ALBUMIN: 4 g/dL (ref 3.5–5.0)
ALK PHOS: 104 U/L (ref 38–126)
ALT: 27 U/L (ref 17–63)
AST: 30 U/L (ref 15–41)
Anion gap: 10 (ref 5–15)
BILIRUBIN TOTAL: 0.5 mg/dL (ref 0.3–1.2)
CALCIUM: 9.1 mg/dL (ref 8.9–10.3)
CO2: 28 mmol/L (ref 22–32)
CREATININE: 0.64 mg/dL (ref 0.61–1.24)
Chloride: 90 mmol/L — ABNORMAL LOW (ref 101–111)
GFR calc Af Amer: >60 mL/min (ref >60)
GLUCOSE: 114 mg/dL — AB (ref 65–99)
POTASSIUM: 3.9 mmol/L (ref 3.5–5.1)
Sodium: 128 mmol/L — ABNORMAL LOW (ref 135–145)
TOTAL PROTEIN: 7.5 g/dL (ref 6.5–8.1)

## 2017-02-26 LAB — CBC
HCT: 42.1 % (ref 39.0–52.0)
Hemoglobin: 14.3 g/dL (ref 13.0–17.0)
MCH: 30.7 pg (ref 26.0–34.0)
MCHC: 34 g/dL (ref 30.0–36.0)
MCV: 90.3 fL (ref 78.0–100.0)
PLATELETS: 404 10*3/uL — AB (ref 150–400)
RBC: 4.66 MIL/uL (ref 4.22–5.81)
RDW: 13.1 % (ref 11.5–15.5)
WBC: 15.2 10*3/uL — AB (ref 4.0–10.5)

## 2017-02-26 LAB — RAPID STREP SCREEN (MED CTR MEBANE ONLY): STREPTOCOCCUS, GROUP A SCREEN (DIRECT): NEGATIVE

## 2017-02-26 MED ORDER — SODIUM CHLORIDE 0.9 % IV BOLUS (SEPSIS)
1000.0000 mL | Freq: Once | INTRAVENOUS | Status: AC
  Administered 2017-02-26: 1000 mL via INTRAVENOUS

## 2017-02-26 MED ORDER — DICYCLOMINE HCL 10 MG PO CAPS
20.0000 mg | ORAL_CAPSULE | Freq: Once | ORAL | Status: AC
  Administered 2017-02-26: 20 mg via ORAL
  Filled 2017-02-26: qty 2

## 2017-02-26 MED ORDER — KETOROLAC TROMETHAMINE 15 MG/ML IJ SOLN
15.0000 mg | Freq: Once | INTRAMUSCULAR | Status: AC
  Administered 2017-02-26: 15 mg via INTRAVENOUS
  Filled 2017-02-26: qty 1

## 2017-02-26 MED ORDER — ONDANSETRON HCL 4 MG/2ML IJ SOLN
4.0000 mg | Freq: Once | INTRAMUSCULAR | Status: AC
  Administered 2017-02-26: 4 mg via INTRAVENOUS
  Filled 2017-02-26: qty 2

## 2017-02-26 MED ORDER — SODIUM CHLORIDE 0.9 % IV BOLUS (SEPSIS)
1000.0000 mL | Freq: Once | INTRAVENOUS | Status: AC
Start: 2017-02-26 — End: 2017-02-26
  Administered 2017-02-26: 1000 mL via INTRAVENOUS

## 2017-02-26 MED ORDER — LORAZEPAM 1 MG PO TABS
1.0000 mg | ORAL_TABLET | Freq: Once | ORAL | Status: AC
  Administered 2017-02-26: 1 mg via ORAL
  Filled 2017-02-26: qty 1

## 2017-02-26 MED ORDER — DICYCLOMINE HCL 20 MG PO TABS: 20.0000 mg | ORAL_TABLET | Freq: Three times a day (TID) | ORAL | 0 refills | Status: DC

## 2017-02-26 MED ORDER — HYDROXYZINE HCL 25 MG PO TABS
25.0000 mg | ORAL_TABLET | Freq: Three times a day (TID) | ORAL | 0 refills | Status: DC | PRN
Start: 2017-02-26 — End: 2017-05-11

## 2017-02-26 MED ORDER — IOPAMIDOL (ISOVUE-300) INJECTION 61%
100.0000 mL | Freq: Once | INTRAVENOUS | Status: AC | PRN
  Administered 2017-02-26: 100 mL via INTRAVENOUS

## 2017-02-26 MED ORDER — LORAZEPAM 2 MG/ML IJ SOLN
1.0000 mg | Freq: Once | INTRAMUSCULAR | Status: AC
  Administered 2017-02-26: 1 mg via INTRAVENOUS
  Filled 2017-02-26: qty 1

## 2017-02-26 NOTE — ED Notes (Signed)
Patient unable to urinate when trying. Patient states he feels the urge to urinate but hasn't been able to today. States the last time he urinated was this morning. Bladder scanned patient and there was 240m in bladder.

## 2017-02-26 NOTE — ED Notes (Signed)
Patient gave urine specimen in urinal.

## 2017-02-26 NOTE — ED Provider Notes (Signed)
[facility] DEPT Provider Note   CSN: [id number] Arrival date & time: 02/26/17  3810     History   Chief Complaint Chief Complaint  Patient presents with  . Nausea  . Abdominal Pain  . throat pain    HPI Anthony Lamb is a 61 y.o. male.  HPI   61 yo M with PMHx as below including chronic pain, chronic anxiety, IBS/abdominal pain here with ongoing abd pain. Pt states that over the past several weeks, he has had progressively worsening abdominal pain, poor appetite, and diarrhea. His abd pain is cramp like, worse with eating and movement. He has a h/o colitis per report and has had similar sx in the past. He states that he has not been eating/drinking much due to this pain as well as nausea, loss of appetite. No weight loss. He reports taht his sx seem to worsen with eating, but also with anxiety. No specific alleviating factors. He is concerned about dehydration and has a history of same. He is urinating without difficulty. He has not had any fever, chills. He has nausea but no vomiting.  Past Medical History:  Diagnosis Date  . Alcohol abuse, in remission   . Anemia   . Anxiety   . Chronic pain   . Colitis   . Depression   . Esophagitis   . Gastritis   . GERD (gastroesophageal reflux disease)   . Hypertension   . IBS (irritable bowel syndrome)   . Poor dentition     Patient Active Problem List   Diagnosis Date Noted  . Dehydration 12/13/2016  . Alcohol abuse   . Depression   . SOB (shortness of breath)   . Chest pain 11/11/2016  . Anxiety   . Gastroesophageal reflux disease   . Osteopenia determined by x-ray 08/06/2014  . Stress fracture of calcaneus 08/06/2014  . Vitamin D deficiency 12/18/2013  . Dementia 12/18/2013  . Benign microscopic hematuria 07/06/2013  . 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 Hyponatremia 06/20/2013  . Ankylosing spondylitis ([place]) 06/20/2013  . Malnutrition of moderate degree ([place]) 06/20/2013  .  Depression with anxiety 05/31/2013  . BPH (benign prostatic hyperplasia) 08/22/2010  . Backache 04/19/2010  . ALCOHOLISM 08/13/2008  . History of colonic polyps 08/13/2008  . Essential hypertension 07/03/2007  . PUD (peptic ulcer disease) 07/03/2007  . Irritable bowel syndrome 07/03/2007    Past Surgical History:  Procedure Laterality Date  . COLONOSCOPY W/ BIOPSIES    . ESOPHAGOGASTRODUODENOSCOPY    . HERNIA REPAIR Bilateral   . TONSILLECTOMY         Home Medications    Prior to Admission medications   Medication Sig Start Date End Date Taking? Authorizing Provider  Cholecalciferol (VITAMIN D) 2000 units CAPS Take 4,000 Units by mouth daily.    Yes [provider]  ferrous sulfate 325 (65 FE) MG tablet Take 325 mg by mouth daily with breakfast.   Yes [provider]  lisinopril-hydrochlorothiazide (PRINZIDE,ZESTORETIC) 10-12.5 MG tablet Take 1 tablet by mouth at bedtime.   Yes [provider]  pantoprazole (PROTONIX) 40 MG tablet Take 1 tablet (40 mg total) by mouth daily. Patient taking differently: Take 40 mg by mouth at bedtime.  04/24/14  Yes Janith Lima, MD  sucralfate (CARAFATE) 1 G tablet Take 1 tablet (1 g total) by mouth 4 (four) times daily -  with meals and at bedtime. Chew before swallowing 12/17/13  Yes Janith Lima, MD  Thiamine HCl (  VITAMIN B-1 PO) Take 1 tablet by mouth daily.   Yes [provider]  traZODone (DESYREL) 100 MG tablet TK 1 T PO HS 01/26/17  Yes [provider]  verapamil (CALAN-SR) 240 MG CR tablet Take one tablet by mouth at bedtime 08/25/14  Yes [provider]  zolpidem (AMBIEN) 10 MG tablet Take 10 mg by mouth at bedtime.   Yes [provider]  amLODipine (NORVASC) 5 MG tablet Take 1 tablet (5 mg total) by mouth daily. Patient not taking: Reported on 02/15/2017 12/14/16 12/14/17  Kathrene Alu, MD  atorvastatin (LIPITOR) 20 MG tablet Take 1 tablet (20 mg total) by mouth daily  at 6 PM. Patient not taking: Reported on 02/15/2017 11/12/16   Archie Patten, MD  chlordiazePOXIDE (LIBRIUM) 25 MG capsule 48m PO TID x 1D, then 25-550mPO BID X 1D, then 25-5084mO QD X 1D Patient not taking: Reported on 02/15/2017 01/05/17   YaoDrenda FreezeD  dicyclomine (BENTYL) 20 MG tablet Take 1 tablet (20 mg total) by mouth 4 (four) times daily -  before meals and at bedtime. 02/26/17 03/08/17  IsaDuffy BruceD  folic acid (FOLVITE) 1 MG tablet Take 1 tablet (1 mg total) by mouth daily. Patient not taking: Reported on 02/15/2017 12/15/16   WinKathrene AluD  hydrOXYzine (ATARAX/VISTARIL) 25 MG tablet Take 1 tablet (25 mg total) by mouth every 8 (eight) hours as needed for anxiety (sleep). 02/26/17   IsaDuffy BruceD  LORazepam (ATIVAN) 0.5 MG tablet Take 1 tablet (0.5 mg total) by mouth at bedtime. Patient not taking: Reported on 12/13/2016 11/12/16 11/12/17  DorArchie PattenD  metoCLOPramide (REGLAN) 10 MG tablet Take 1 tablet (10 mg total) by mouth every 6 (six) hours as needed for nausea (nausea/headache). 01/05/17   YaoDrenda FreezeD  thiamine 100 MG tablet Take 1 tablet (100 mg total) by mouth daily. Patient not taking: Reported on 02/26/2017 12/15/16   WinKathrene AluD    Family History Family History  Problem Relation Age of Onset  . Anxiety disorder Mother   . Congestive Heart Failure Mother   . Crohn's disease Mother   . Arthritis Father   . High blood pressure Father     Social History Social History  Substance Use Topics  . Smoking status: Former Smoker    Types: Cigarettes  . Smokeless tobacco: Never Used  . Alcohol use Yes     Comment: daily/ 3-4 beers     Allergies   Diphenhydramine hcl; Flexeril [cyclobenzaprine]; and Nsaids   Review of Systems Review of Systems  Constitutional: Positive for appetite change and fatigue.  Gastrointestinal: Positive for abdominal distention, abdominal pain and nausea.  Neurological: Positive for  weakness.  All other systems reviewed and are negative.    Physical Exam Updated Vital Signs BP (!) 162/84 (BP Location: Left Arm)   Pulse 83   Temp 97.9 F (36.6 C) (Oral)   Resp 14   Ht 5' 11"  (1.803 m)   Wt 88.5 kg (195 lb)   SpO2 98%   BMI 27.20 kg/m   Physical Exam  Constitutional: He is oriented to person, place, and time. He appears well-developed and well-nourished. No distress.  HENT:  Head: Normocephalic and atraumatic.  Mildly dry MM  Eyes: Conjunctivae are normal.  Neck: Neck supple.  Cardiovascular: Normal rate, regular rhythm and normal heart sounds.  Exam reveals no friction rub.   No murmur heard. Pulmonary/Chest: Effort normal and  breath sounds normal. No respiratory distress. He has no wheezes. He has no rales.  Abdominal: Soft. Normal appearance. He exhibits distension. There is generalized tenderness and tenderness in the left lower quadrant. There is guarding. There is no rigidity and no rebound.  Musculoskeletal: He exhibits no edema.  Neurological: He is alert and oriented to person, place, and time. He exhibits normal muscle tone.  Skin: Skin is warm. Capillary refill takes less than 2 seconds.  Psychiatric: He has a normal mood and affect.  Nursing note and vitals reviewed.    ED Treatments / Results  Labs (all labs ordered are listed, but only abnormal results are displayed) Labs Reviewed  COMPREHENSIVE METABOLIC PANEL - Abnormal; Notable for the following:       Result Value   Sodium 128 (*)    Chloride 90 (*)    Glucose, Bld 114 (*)    BUN <5 (*)    All other components within normal limits  CBC - Abnormal; Notable for the following:    WBC 15.2 (*)    Platelets 404 (*)    All other components within normal limits  URINALYSIS, ROUTINE W REFLEX MICROSCOPIC - Abnormal; Notable for the following:    Hgb urine dipstick SMALL (*)    All other components within normal limits  RAPID STREP SCREEN (NOT AT [facility])  CULTURE, GROUP A STREP ([place])    LIPASE, BLOOD    EKG  EKG Interpretation None       Radiology Ct Abdomen Pelvis W Contrast  Result Date: 02/26/2017 CLINICAL DATA:  Vomiting and abdominal cramping for 2 days. Hospitalized 2 weeks ago for dehydration. History of alcohol abuse, SIADH, hypertension. EXAM: CT ABDOMEN AND PELVIS WITH CONTRAST TECHNIQUE: Multidetector CT imaging of the abdomen and pelvis was performed using the standard protocol following bolus administration of intravenous contrast. CONTRAST:  157m ISOVUE-300 IOPAMIDOL (ISOVUE-300) INJECTION 61% COMPARISON:  CT abdomen and pelvis June 09, 2013 FINDINGS: LOWER CHEST: Lung bases are clear. Included heart size is normal. No pericardial effusion. HEPATOBILIARY: Liver and gallbladder are normal. PANCREAS: Normal. SPLEEN: Normal. ADRENALS/URINARY TRACT: Kidneys are orthotopic, demonstrating symmetric enhancement. No nephrolithiasis, hydronephrosis or solid renal masses. The unopacified ureters are normal in course and caliber. Delayed imaging through the kidneys demonstrates symmetric prompt contrast excretion within the proximal urinary collecting system. Urinary bladder is well distended and unremarkable. Normal adrenal glands. STOMACH/BOWEL: The stomach, small and large bowel are normal in course and caliber without inflammatory changes sensitivity decreased without contrast. Mild colonic diverticulosis. Mildly thickened LEFT adrenal gland associated with hyperplasia without discrete nodule. VASCULAR/LYMPHATIC: Aortoiliac vessels are normal in course and caliber. Moderate calcific atherosclerosis. Inferior vena cava is not flattened. No lymphadenopathy by CT size criteria. REPRODUCTIVE: Normal. OTHER: No intraperitoneal free fluid or free air. MUSCULOSKELETAL: Nonacute. Osteopenia. Ankylosed sacroiliac joints. Mild lower lumbar levoscoliosis. Bridging spine syndesmophytes. IMPRESSION: 1. No acute intra-abdominal or pelvic process. 2. Findings of ankylosing spondylitis.  Aortic Atherosclerosis (ICD10-I70.0). Electronically Signed   By: CElon AlasM.D.   On: 02/26/2017 18:45    Procedures Procedures (including critical care time)  Medications Ordered in ED Medications  sodium chloride 0.9 % bolus 1,000 mL (0 mLs Intravenous Stopped 02/26/17 2028)  LORazepam (ATIVAN) injection 1 mg (1 mg Intravenous Given 02/26/17 1819)  ondansetron (ZOFRAN) injection 4 mg (4 mg Intravenous Given 02/26/17 1819)  dicyclomine (BENTYL) capsule 20 mg (20 mg Oral Given 02/26/17 1818)  iopamidol (ISOVUE-300) 61 % injection 100 mL (100 mLs Intravenous Contrast Given 02/26/17 1827)  sodium chloride 0.9 % bolus 1,000 mL (0 mLs Intravenous Stopped 02/26/17 2154)  LORazepam (ATIVAN) tablet 1 mg (1 mg Oral Given 02/26/17 2153)  ketorolac (TORADOL) 15 MG/ML injection 15 mg (15 mg Intravenous Given 02/26/17 2152)     Initial Impression / Assessment and Plan / ED Course  I have reviewed the triage vital signs and the nursing notes.  Pertinent labs & imaging results that were available during my care of the patient were reviewed by me and considered in my medical decision making (see chart for details).     61 yo M with PMHx as above here with diffuse abd pain, nausea, change in bowel habits. Suspect chronic abd pain, likely with IBS and strong component of anxiety. His labs show acute on chronic hyponatremia, likely hypovolemic, and he has been given fluids here. No signs of infectious process. Given his report of h/o colitis and his LLQ pain on exam, CT obtained and is neg for diverticulitis or other acute abnormality. He feels improved in ED, is tolerating PO. He is requesting Rx for anxiety. Will avoid benzos given h/o substance abuse, trial atarax and d/c with outpt follow-up.  Final Clinical Impressions(s) / ED Diagnoses   Final diagnoses:  Irritable bowel syndrome without diarrhea  Anxiety  Chronic abdominal pain    New Prescriptions Discharge Medication List as of 02/26/2017   9:42 PM    START taking these medications   Details  dicyclomine (BENTYL) 20 MG tablet Take 1 tablet (20 mg total) by mouth 4 (four) times daily -  before meals and at bedtime., Starting Mon 02/26/2017, Until Thu 03/08/2017, Print         Duffy Bruce, MD 02/27/17 1005

## 2017-02-26 NOTE — ED Notes (Signed)
Cannot update patients vital signs at this time. Patient is in CT.

## 2017-02-26 NOTE — ED Triage Notes (Signed)
Per EMS-Patient c/o vomiting and abdominal cramping x 2 days. Patient was recently hospitalized 2 weeks ago for dehydration.

## 2017-02-26 NOTE — ED Notes (Signed)
Patient was offered zofran in triage with protocol orders, but stated the medication gave him a headache.

## 2017-03-01 LAB — CULTURE, GROUP A STREP (THRC)

## 2017-03-13 ENCOUNTER — Emergency Department (HOSPITAL_COMMUNITY)
Admission: EM | Admit: 2017-03-13 | Discharge: 2017-03-13 | Disposition: A | Discharge status: Home / Self Care | Payer: Medicaid Other | Attending: Emergency Medicine | Admitting: Emergency Medicine

## 2017-03-13 ENCOUNTER — Encounter (HOSPITAL_COMMUNITY): Payer: Self-pay

## 2017-03-13 DIAGNOSIS — Z87891 Personal history of nicotine dependence: Secondary | ICD-10-CM | POA: Insufficient documentation

## 2017-03-13 DIAGNOSIS — R197 Diarrhea, unspecified: Secondary | ICD-10-CM | POA: Insufficient documentation

## 2017-03-13 DIAGNOSIS — Z79899 Other long term (current) drug therapy: Secondary | ICD-10-CM | POA: Insufficient documentation

## 2017-03-13 DIAGNOSIS — K580 Irritable bowel syndrome with diarrhea: Secondary | ICD-10-CM | POA: Diagnosis not present

## 2017-03-13 DIAGNOSIS — I10 Essential (primary) hypertension: Secondary | ICD-10-CM | POA: Insufficient documentation

## 2017-03-13 DIAGNOSIS — R252 Cramp and spasm: Secondary | ICD-10-CM | POA: Insufficient documentation

## 2017-03-13 DIAGNOSIS — E871 Hypo-osmolality and hyponatremia: Secondary | ICD-10-CM | POA: Diagnosis not present

## 2017-03-13 LAB — COMPREHENSIVE METABOLIC PANEL
ALBUMIN: 4.1 g/dL (ref 3.5–5.0)
ALK PHOS: 104 U/L (ref 38–126)
ALT: 27 U/L (ref 17–63)
AST: 42 U/L — AB (ref 15–41)
Anion gap: 12 (ref 5–15)
BILIRUBIN TOTAL: 0.8 mg/dL (ref 0.3–1.2)
CALCIUM: 9.1 mg/dL (ref 8.9–10.3)
CO2: 25 mmol/L (ref 22–32)
CREATININE: 0.74 mg/dL (ref 0.61–1.24)
Chloride: 91 mmol/L — ABNORMAL LOW (ref 101–111)
GFR calc Af Amer: >60 mL/min (ref >60)
GLUCOSE: 106 mg/dL — AB (ref 65–99)
Potassium: 4.5 mmol/L (ref 3.5–5.1)
Sodium: 128 mmol/L — ABNORMAL LOW (ref 135–145)
TOTAL PROTEIN: 7.7 g/dL (ref 6.5–8.1)

## 2017-03-13 LAB — CBC
HCT: 43.7 % (ref 39.0–52.0)
Hemoglobin: 14.9 g/dL (ref 13.0–17.0)
MCH: 30.6 pg (ref 26.0–34.0)
MCHC: 34.1 g/dL (ref 30.0–36.0)
MCV: 89.7 fL (ref 78.0–100.0)
PLATELETS: 407 10*3/uL — AB (ref 150–400)
RBC: 4.87 MIL/uL (ref 4.22–5.81)
RDW: 12.7 % (ref 11.5–15.5)
WBC: 11.9 10*3/uL — AB (ref 4.0–10.5)

## 2017-03-13 LAB — URINALYSIS, ROUTINE W REFLEX MICROSCOPIC
BILIRUBIN URINE: NEGATIVE
Glucose, UA: NEGATIVE mg/dL
KETONES UR: NEGATIVE mg/dL
LEUKOCYTES UA: NEGATIVE
NITRITE: NEGATIVE
PH: 7.5 (ref 5.0–8.0)
PROTEIN: NEGATIVE mg/dL
Specific Gravity, Urine: 1.01 (ref 1.005–1.030)

## 2017-03-13 LAB — URINALYSIS, MICROSCOPIC (REFLEX): SQUAMOUS EPITHELIAL / LPF: NONE SEEN

## 2017-03-13 LAB — LIPASE, BLOOD: Lipase: 22 U/L (ref 11–51)

## 2017-03-13 MED ORDER — SODIUM CHLORIDE 0.9 % IV BOLUS (SEPSIS)
1000.0000 mL | Freq: Once | INTRAVENOUS | Status: AC
  Administered 2017-03-13: 1000 mL via INTRAVENOUS

## 2017-03-13 MED ORDER — VERAPAMIL HCL ER 240 MG PO TBCR
240.0000 mg | EXTENDED_RELEASE_TABLET | Freq: Every day | ORAL | Status: DC
  Administered 2017-03-13: 240 mg via ORAL
  Filled 2017-03-13: qty 1

## 2017-03-13 MED ORDER — LISINOPRIL 10 MG PO TABS: 10.0000 mg | ORAL_TABLET | Freq: Every day | ORAL | Status: DC

## 2017-03-13 MED ORDER — HYDROCHLOROTHIAZIDE 12.5 MG PO CAPS: 12.5000 mg | ORAL_CAPSULE | Freq: Every day | ORAL | Status: DC

## 2017-03-13 MED ORDER — LISINOPRIL-HYDROCHLOROTHIAZIDE 10-12.5 MG PO TABS: 1.0000 | ORAL_TABLET | Freq: Every day | ORAL | Status: DC

## 2017-03-13 MED ORDER — MAGNESIUM SULFATE 2 GM/50ML IV SOLN
2.0000 g | Freq: Once | INTRAVENOUS | Status: AC
  Administered 2017-03-13: 2 g via INTRAVENOUS
  Filled 2017-03-13: qty 50

## 2017-03-13 MED ORDER — LISINOPRIL 10 MG PO TABS
10.0000 mg | ORAL_TABLET | Freq: Every day | ORAL | Status: DC
  Administered 2017-03-13: 10 mg via ORAL
  Filled 2017-03-13: qty 1

## 2017-03-13 MED ORDER — VITAMIN B-1 100 MG PO TABS
100.0000 mg | ORAL_TABLET | Freq: Every day | ORAL | Status: DC
  Administered 2017-03-13: 100 mg via ORAL
  Filled 2017-03-13: qty 1

## 2017-03-13 MED ORDER — HYDROCHLOROTHIAZIDE 12.5 MG PO CAPS
12.5000 mg | ORAL_CAPSULE | Freq: Every day | ORAL | Status: DC
  Administered 2017-03-13: 12.5 mg via ORAL
  Filled 2017-03-13: qty 1

## 2017-03-13 MED ORDER — VERAPAMIL HCL ER 240 MG PO TBCR: 240.0000 mg | EXTENDED_RELEASE_TABLET | Freq: Every day | ORAL | Status: DC

## 2017-03-13 MED ORDER — SODIUM CHLORIDE 1 G PO TABS: 1.0000 g | ORAL_TABLET | Freq: Every day | ORAL | 0 refills | Status: DC

## 2017-03-13 NOTE — ED Notes (Signed)
Spoke with pharmacy about changing medications to administer soon as possible instead of on a scheduled bases. Pharmacy reported medications would have to be changed. Informed Abigail, PA of changing the medication orders.

## 2017-03-13 NOTE — Discharge Instructions (Signed)
Contact a health care provider if: You have a fever. Your diarrhea gets worse. You have new symptoms. You cannot keep fluids down. You feel light-headed or dizzy. You have a headache You have muscle cramps. Get help right away if: You have chest pain. You feel extremely weak or you faint. You have bloody or black stools or stools that look like tar. You have severe pain, cramping, or bloating in your abdomen. You have trouble breathing or you are breathing very quickly. Your heart is beating very quickly. Your skin feels cold and clammy. You feel confused. You have signs of dehydration, such as: Dark urine, very little urine, or no urine. Cracked lips. Dry mouth. Sunken eyes. Sleepiness. Weakness.

## 2017-03-13 NOTE — ED Provider Notes (Signed)
[facility] DEPT Provider Note   CSN: [id number] Arrival date & time: 03/13/17  6433     History   Chief Complaint Chief Complaint  Patient presents with  . Diarrhea  . leg cramps    HPI Anthony Lamb is a 61 y.o. male is a past medical history of alcohol abuse, anxiety, SIADH. He has a history of colitis and IBS. He presents emergency Department with chief complaint of leg cramps and diarrhea. He has had loose stools several times daily for the past 2 weeks. He tried Imodium which slowed his diarrhea. He denies abdominal pain, nausea, vomiting. He feels dehydrated and is concerned that he may have electrolyte disturbances. He has not noted any blood or mucus in his stool. He has not been running fevers or chills. No recent suspicious food ingestion.  HPI  Past Medical History:  Diagnosis Date  . Alcohol abuse, in remission   . Anemia   . Anxiety   . Chronic pain   . Colitis   . Depression   . Esophagitis   . Gastritis   . GERD (gastroesophageal reflux disease)   . Hypertension   . IBS (irritable bowel syndrome)   . Poor dentition     Patient Active Problem List   Diagnosis Date Noted  . Dehydration 12/13/2016  . Alcohol abuse   . Depression   . SOB (shortness of breath)   . Chest pain 11/11/2016  . Anxiety   . Gastroesophageal reflux disease   . Osteopenia determined by x-ray 08/06/2014  . Stress fracture of calcaneus 08/06/2014  . Vitamin D deficiency 12/18/2013  . Dementia 12/18/2013  . Benign microscopic hematuria 07/06/2013  . 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 Hyponatremia 06/20/2013  . Ankylosing spondylitis ([place]) 06/20/2013  . Malnutrition of moderate degree ([place]) 06/20/2013  . Depression with anxiety 05/31/2013  . BPH (benign prostatic hyperplasia) 08/22/2010  . Backache 04/19/2010  . ALCOHOLISM 08/13/2008  . History of colonic polyps 08/13/2008  . Essential hypertension 07/03/2007  . PUD (peptic ulcer  disease) 07/03/2007  . Irritable bowel syndrome 07/03/2007    Past Surgical History:  Procedure Laterality Date  . COLONOSCOPY W/ BIOPSIES    . ESOPHAGOGASTRODUODENOSCOPY    . HERNIA REPAIR Bilateral   . TONSILLECTOMY         Home Medications    Prior to Admission medications   Medication Sig Start Date End Date Taking? Authorizing Provider  Cholecalciferol (VITAMIN D) 2000 units CAPS Take 4,000 Units by mouth daily.    Yes [provider]  ferrous sulfate 325 (65 FE) MG tablet Take 325 mg by mouth daily with breakfast.   Yes [provider]  hydrOXYzine (ATARAX/VISTARIL) 25 MG tablet Take 1 tablet (25 mg total) by mouth every 8 (eight) hours as needed for anxiety (sleep). 02/26/17  Yes Duffy Bruce, MD  lisinopril-hydrochlorothiazide (PRINZIDE,ZESTORETIC) 10-12.5 MG tablet Take 1 tablet by mouth at bedtime.   Yes [provider]  LORazepam (ATIVAN) 0.5 MG tablet Take 1 tablet (0.5 mg total) by mouth at bedtime. Patient taking differently: Take 0.5 mg by mouth 3 (three) times daily.  11/12/16 11/12/17 Yes Archie Patten, MD  pantoprazole (PROTONIX) 40 MG tablet Take 1 tablet (40 mg total) by mouth daily. Patient taking differently: Take 40 mg by mouth daily with breakfast.  04/24/14  Yes Janith Lima, MD  sucralfate (CARAFATE) 1 G tablet Take 1 tablet (1 g total) by mouth 4 (four) times daily -  with meals and at bedtime. Chew before swallowing Patient taking differently: Take 1 g by mouth 2 (two) times daily. Chew before swallowing 12/17/13  Yes Janith Lima, MD  Thiamine HCl (VITAMIN B-1 PO) Take 1 tablet by mouth daily.   Yes [provider]  verapamil (CALAN-SR) 240 MG CR tablet Take 240 mg by mouth at bedtime. 02/09/17  Yes [provider]  zolpidem (AMBIEN) 10 MG tablet Take 10 mg by mouth at bedtime.   Yes [provider]  amLODipine (NORVASC) 5 MG tablet Take 1 tablet (5 mg total) by mouth daily. Patient not taking:  Reported on 02/15/2017 12/14/16 12/14/17  Kathrene Alu, MD  atorvastatin (LIPITOR) 20 MG tablet Take 1 tablet (20 mg total) by mouth daily at 6 PM. Patient not taking: Reported on 02/15/2017 11/12/16   Archie Patten, MD  chlordiazePOXIDE (LIBRIUM) 25 MG capsule 22m PO TID x 1D, then 25-569mPO BID X 1D, then 25-5016mO QD X 1D Patient not taking: Reported on 02/15/2017 01/05/17   YaoDrenda FreezeD  dicyclomine (BENTYL) 20 MG tablet Take 1 tablet (20 mg total) by mouth 4 (four) times daily -  before meals and at bedtime. Patient not taking: Reported on 03/13/2017 02/26/17 03/08/17  IsaDuffy BruceD  folic acid (FOLVITE) 1 MG tablet Take 1 tablet (1 mg total) by mouth daily. Patient not taking: Reported on 02/15/2017 12/15/16   WinKathrene AluD  metoCLOPramide (REGLAN) 10 MG tablet Take 1 tablet (10 mg total) by mouth every 6 (six) hours as needed for nausea (nausea/headache). Patient not taking: Reported on 03/13/2017 01/05/17   YaoDrenda FreezeD  thiamine 100 MG tablet Take 1 tablet (100 mg total) by mouth daily. Patient not taking: Reported on 02/26/2017 12/15/16   WinKathrene AluD    Family History Family History  Problem Relation Age of Onset  . Anxiety disorder Mother   . Congestive Heart Failure Mother   . Crohn's disease Mother   . Arthritis Father   . High blood pressure Father     Social History Social History  Substance Use Topics  . Smoking status: Former Smoker    Types: Cigarettes  . Smokeless tobacco: Never Used  . Alcohol use Yes     Comment: daily/ 3-4 beers     Allergies   Diphenhydramine hcl; Flexeril [cyclobenzaprine]; and Nsaids   Review of Systems Review of Systems  Ten systems reviewed and are negative for acute change, except as noted in the HPI.   Physical Exam Updated Vital Signs BP (!) 167/86 (BP Location: Left Arm)   Pulse 76   Temp 98.1 F (36.7 C) (Oral)   Resp 20   Ht 5' 11"  (1.803 m)   Wt 87.5 kg (193 lb)   SpO2 95%    BMI 26.92 kg/m   Physical Exam  Constitutional: He appears well-developed and well-nourished. No distress.  HENT:  Head: Normocephalic and atraumatic.  Angular cHElitis  Eyes: Conjunctivae are normal. No scleral icterus.  Neck: Normal range of motion. Neck supple.  Cardiovascular: Normal rate, regular rhythm and normal heart sounds.   Pulmonary/Chest: Effort normal and breath sounds normal. No respiratory distress.  Abdominal: Soft. Bowel sounds are normal. He exhibits no distension. There is no tenderness. There is no guarding.  Musculoskeletal: He exhibits no edema.  Neurological: He is alert.  Skin: Skin is warm and dry. He is not diaphoretic.  Psychiatric: His behavior is normal.  Nursing note and  vitals reviewed.    ED Treatments / Results  Labs (all labs ordered are listed, but only abnormal results are displayed) Labs Reviewed  LIPASE, BLOOD  COMPREHENSIVE METABOLIC PANEL  CBC  URINALYSIS, ROUTINE W REFLEX MICROSCOPIC    EKG  EKG Interpretation None       Radiology No results found.  Procedures Procedures (including critical care time)  Medications Ordered in ED Medications - No data to display   Initial Impression / Assessment and Plan / ED Course  I have reviewed the triage vital signs and the nursing notes.  Pertinent labs & imaging results that were available during my care of the patient were reviewed by me and considered in my medical decision making (see chart for details).     Patient with frequent stools. History of IBS. No abdominal tenderness on exam. No heavy hyponatremic. Given thiamine, fluids and magnesium. He is not having any more leg cramps. He has not had any diarrhea since he has arrived. He has only had one episode at all today. He feels improved and would like to go home. I doubt he needs further imaging at this time I do note he has a slight elevation in his white count however he is afebrile, without tachycardia or hypertension and  has no abdominal pain. Patient appears safe for discharge at this time. Will discharge with 2 days of sodium tablets. He may follow up with his primary care physician. I discussed return precautions patient.  Final Clinical Impressions(s) / ED Diagnoses   Final diagnoses:  None    New Prescriptions New Prescriptions   No medications on file     Margarita Mail, PA-C 03/13/17 1715    Tanna Furry, MD 03/25/17 1610

## 2017-03-13 NOTE — ED Notes (Signed)
Provider at bedside

## 2017-03-13 NOTE — ED Triage Notes (Addendum)
Patient c/o diarrhea x 3-4 days ago. Patient states, "I have IBS and colitis. I haven't been eating right." Patient denies any blood in his stool. Patient also reports that he did not take his BP med last night. Patient also c/o bilateral leg cramping.

## 2017-03-24 ENCOUNTER — Emergency Department (HOSPITAL_COMMUNITY): Payer: Medicaid Other

## 2017-03-24 ENCOUNTER — Emergency Department (HOSPITAL_COMMUNITY)
Admission: EM | Admit: 2017-03-24 | Discharge: 2017-03-24 | Disposition: A | Discharge status: Home / Self Care | Payer: Medicaid Other | Attending: Emergency Medicine | Admitting: Emergency Medicine

## 2017-03-24 ENCOUNTER — Encounter (HOSPITAL_COMMUNITY): Payer: Self-pay

## 2017-03-24 DIAGNOSIS — R079 Chest pain, unspecified: Secondary | ICD-10-CM | POA: Insufficient documentation

## 2017-03-24 DIAGNOSIS — F419 Anxiety disorder, unspecified: Secondary | ICD-10-CM | POA: Diagnosis not present

## 2017-03-24 DIAGNOSIS — F0390 Unspecified dementia without behavioral disturbance: Secondary | ICD-10-CM | POA: Insufficient documentation

## 2017-03-24 DIAGNOSIS — Z87891 Personal history of nicotine dependence: Secondary | ICD-10-CM | POA: Diagnosis not present

## 2017-03-24 DIAGNOSIS — I10 Essential (primary) hypertension: Secondary | ICD-10-CM | POA: Diagnosis not present

## 2017-03-24 DIAGNOSIS — R0602 Shortness of breath: Secondary | ICD-10-CM | POA: Diagnosis not present

## 2017-03-24 DIAGNOSIS — E871 Hypo-osmolality and hyponatremia: Secondary | ICD-10-CM | POA: Diagnosis not present

## 2017-03-24 DIAGNOSIS — Z79899 Other long term (current) drug therapy: Secondary | ICD-10-CM | POA: Insufficient documentation

## 2017-03-24 LAB — CBC
HCT: 45.4 % (ref 39.0–52.0)
HEMOGLOBIN: 15.7 g/dL (ref 13.0–17.0)
MCH: 31 pg (ref 26.0–34.0)
MCHC: 34.6 g/dL (ref 30.0–36.0)
MCV: 89.7 fL (ref 78.0–100.0)
Platelets: 359 10*3/uL (ref 150–400)
RBC: 5.06 MIL/uL (ref 4.22–5.81)
RDW: 12.6 % (ref 11.5–15.5)
WBC: 12.1 10*3/uL — ABNORMAL HIGH (ref 4.0–10.5)

## 2017-03-24 LAB — BASIC METABOLIC PANEL
ANION GAP: 13 (ref 5–15)
BUN: <5 mg/dL — ABNORMAL LOW (ref 6–20)
CALCIUM: 9.1 mg/dL (ref 8.9–10.3)
CHLORIDE: 88 mmol/L — AB (ref 101–111)
CO2: 23 mmol/L (ref 22–32)
Creatinine, Ser: 0.72 mg/dL (ref 0.61–1.24)
GFR calc non Af Amer: >60 mL/min (ref >60)
Glucose, Bld: 110 mg/dL — ABNORMAL HIGH (ref 65–99)
Potassium: 3.9 mmol/L (ref 3.5–5.1)
Sodium: 124 mmol/L — ABNORMAL LOW (ref 135–145)

## 2017-03-24 LAB — I-STAT TROPONIN, ED: TROPONIN I, POC: 0.01 ng/mL (ref 0.00–0.08)

## 2017-03-24 MED ORDER — SODIUM CHLORIDE 0.9 % IV BOLUS (SEPSIS)
1000.0000 mL | Freq: Once | INTRAVENOUS | Status: AC
  Administered 2017-03-24: 1000 mL via INTRAVENOUS

## 2017-03-24 MED ORDER — LORAZEPAM 1 MG PO TABS
0.5000 mg | ORAL_TABLET | Freq: Once | ORAL | Status: AC
  Administered 2017-03-24: 0.5 mg via ORAL
  Filled 2017-03-24: qty 1

## 2017-03-24 MED ORDER — CHLORDIAZEPOXIDE HCL 25 MG PO CAPS: ORAL_CAPSULE | ORAL | 0 refills | Status: DC

## 2017-03-24 NOTE — ED Provider Notes (Signed)
[facility] EMERGENCY DEPARTMENT Provider Note   CSN: [id number] Arrival date & time: 03/24/17  1213     History   Chief Complaint Chief Complaint  Patient presents with  . Chest Pain    HPI Anthony Lamb is a 61 y.o. male.  61yo M w/ PMH including HTN, alcohol abuse, IBS, anxiety who p/w chest pain. He reports 2 days of intermittent sharp, left-sided chest pain that lasts approximately 1-2 minutes at a time and is associated with muscle twitches. He reports associated shortness of breath. He states he feels pretty anxious and has had a decreased appetite. He's been out of his anxiety medications for the past few days and does not have an appointment until later this week with his PCP. His last alcohol was 2 days ago. He does not want to drink but also does not want to feel the symptoms anymore.No fevers. He does admit that he is under a lot of stress because he was living in a house and now had to move to an apartment.   The history is provided by the patient.  Chest Pain      Past Medical History:  Diagnosis Date  . Alcohol abuse, in remission   . Anemia   . Anxiety   . Chronic pain   . Colitis   . Depression   . Esophagitis   . Gastritis   . GERD (gastroesophageal reflux disease)   . Hypertension   . IBS (irritable bowel syndrome)   . Poor dentition     Patient Active Problem List   Diagnosis Date Noted  . Dehydration 12/13/2016  . Alcohol abuse   . Depression   . SOB (shortness of breath)   . Chest pain 11/11/2016  . Anxiety   . Gastroesophageal reflux disease   . Osteopenia determined by x-ray 08/06/2014  . Stress fracture of calcaneus 08/06/2014  . Vitamin D deficiency 12/18/2013  . Dementia 12/18/2013  . Benign microscopic hematuria 07/06/2013  . 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 Hyponatremia 06/20/2013  . Ankylosing spondylitis ([place]) 06/20/2013  . Malnutrition of moderate degree ([place])  06/20/2013  . Depression with anxiety 05/31/2013  . BPH (benign prostatic hyperplasia) 08/22/2010  . Backache 04/19/2010  . ALCOHOLISM 08/13/2008  . History of colonic polyps 08/13/2008  . Essential hypertension 07/03/2007  . PUD (peptic ulcer disease) 07/03/2007  . Irritable bowel syndrome 07/03/2007    Past Surgical History:  Procedure Laterality Date  . COLONOSCOPY W/ BIOPSIES    . ESOPHAGOGASTRODUODENOSCOPY    . HERNIA REPAIR Bilateral   . TONSILLECTOMY         Home Medications    Prior to Admission medications   Medication Sig Start Date End Date Taking? Authorizing Provider  Cholecalciferol (VITAMIN D) 2000 units CAPS Take 4,000 Units by mouth daily.    Yes [provider]  ferrous sulfate 325 (65 FE) MG tablet Take 325 mg by mouth daily with breakfast.   Yes [provider]  hydrOXYzine (ATARAX/VISTARIL) 25 MG tablet Take 1 tablet (25 mg total) by mouth every 8 (eight) hours as needed for anxiety (sleep). 02/26/17  Yes Duffy Bruce, MD  lisinopril-hydrochlorothiazide (PRINZIDE,ZESTORETIC) 10-12.5 MG tablet Take 1 tablet by mouth at bedtime.   Yes [provider]  LORazepam (ATIVAN) 0.5 MG tablet Take 1 tablet (0.5 mg total) by mouth at bedtime. Patient taking differently: Take 0.5 mg by mouth 3 (three) times daily.  11/12/16 11/12/17 Yes Dorsey, Donette Larry,  MD  pantoprazole (PROTONIX) 40 MG tablet Take 1 tablet (40 mg total) by mouth daily. Patient taking differently: Take 40 mg by mouth daily with breakfast.  04/24/14  Yes Janith Lima, MD  sucralfate (CARAFATE) 1 G tablet Take 1 tablet (1 g total) by mouth 4 (four) times daily -  with meals and at bedtime. Chew before swallowing Patient taking differently: Take 1 g by mouth 4 (four) times daily. Chew before swallowing 12/17/13  Yes Janith Lima, MD  thiamine 100 MG tablet Take 1 tablet (100 mg total) by mouth daily. 12/15/16  Yes Winfrey, Alcario Drought, MD  verapamil (CALAN-SR) 240 MG CR tablet  Take 240 mg by mouth at bedtime. 02/09/17  Yes [provider]  zolpidem (AMBIEN) 10 MG tablet Take 10 mg by mouth at bedtime.   Yes [provider]  amLODipine (NORVASC) 5 MG tablet Take 1 tablet (5 mg total) by mouth daily. Patient not taking: Reported on 02/15/2017 12/14/16 12/14/17  Kathrene Alu, MD  atorvastatin (LIPITOR) 20 MG tablet Take 1 tablet (20 mg total) by mouth daily at 6 PM. Patient not taking: Reported on 02/15/2017 11/12/16   Archie Patten, MD  chlordiazePOXIDE (LIBRIUM) 25 MG capsule 67m PO TID x 1D, then 25-552mPO BID X 1D, then 25-5040mO QD X 1D 03/24/17   Arya Boxley, RacWenda OverlandD  dicyclomine (BENTYL) 20 MG tablet Take 1 tablet (20 mg total) by mouth 4 (four) times daily -  before meals and at bedtime. Patient not taking: Reported on 03/13/2017 02/26/17 03/08/17  IsaDuffy BruceD  folic acid (FOLVITE) 1 MG tablet Take 1 tablet (1 mg total) by mouth daily. Patient not taking: Reported on 02/15/2017 12/15/16   WinKathrene AluD  metoCLOPramide (REGLAN) 10 MG tablet Take 1 tablet (10 mg total) by mouth every 6 (six) hours as needed for nausea (nausea/headache). Patient not taking: Reported on 03/13/2017 01/05/17   YaoDrenda FreezeD  sodium chloride 1 g tablet Take 1 tablet (1 g total) by mouth daily. For 2 days Patient not taking: Reported on 03/24/2017 03/13/17   HarMargarita MailA-C    Family History Family History  Problem Relation Age of Onset  . Anxiety disorder Mother   . Congestive Heart Failure Mother   . Crohn's disease Mother   . Arthritis Father   . High blood pressure Father     Social History Social History  Substance Use Topics  . Smoking status: Former Smoker    Types: Cigarettes  . Smokeless tobacco: Never Used  . Alcohol use Yes     Comment: daily/ 3-4 beers     Allergies   Diphenhydramine hcl; Flexeril [cyclobenzaprine]; and Nsaids   Review of Systems Review of Systems  Cardiovascular: Positive for chest  pain.   All other systems reviewed and are negative except that which was mentioned in HPI   Physical Exam Updated Vital Signs BP (!) 152/85   Pulse 77   Resp 15   SpO2 99%   Physical Exam  Constitutional: He is oriented to person, place, and time. He appears well-developed and well-nourished. No distress.  HENT:  Head: Normocephalic and atraumatic.  Poor dentition  Eyes: Pupils are equal, round, and reactive to light. Conjunctivae are normal.  Neck: Neck supple.  Cardiovascular: Normal rate, regular rhythm and normal heart sounds.   No murmur heard. Pulmonary/Chest: Effort normal and breath sounds normal.  Abdominal: Soft. Bowel sounds are normal. He exhibits no distension. There  is no tenderness.  Musculoskeletal: He exhibits no edema.  Neurological: He is alert and oriented to person, place, and time.  Fluent speech  Skin: Skin is warm and dry.  Psychiatric:  Anxious, bizarre affect  Nursing note and vitals reviewed.    ED Treatments / Results  Labs (all labs ordered are listed, but only abnormal results are displayed) Labs Reviewed  BASIC METABOLIC PANEL - Abnormal; Notable for the following:       Result Value   Sodium 124 (*)    Chloride 88 (*)    Glucose, Bld 110 (*)    BUN <5 (*)    All other components within normal limits  CBC - Abnormal; Notable for the following:    WBC 12.1 (*)    All other components within normal limits  I-STAT TROPONIN, ED    EKG  EKG Interpretation  Date/Time:  Saturday March 24 2017 12:23:59 EDT Ventricular Rate:  88 PR Interval:  168 QRS Duration: 102 QT Interval:  374 QTC Calculation: 452 R Axis:   10 Text Interpretation:  Normal sinus rhythm Normal ECG No significant change since last tracing Confirmed by Theotis Burrow [phone]) on 03/24/2017 1:29:25 PM       Radiology Dg Chest 2 View  Result Date: 03/24/2017 CLINICAL DATA:  Left-sided chest pain, shortness breath, and dizziness for 2 days. EXAM: CHEST  2 VIEW  COMPARISON:  12/13/2016 FINDINGS: The heart size and mediastinal contours are within normal limits. Aortic atherosclerosis. Stable mild bibasilar scarring. No evidence of pulmonary infiltrate, edema, or pleural effusion. Smooth anterior syndesmophyte formation seen throughout the thoracic spine without disc space narrowing. This has appearance of a "bamboo spine" suspicious for ankylosing spondylitis. IMPRESSION: No active cardiopulmonary disease. Bamboo appearance of thoracic spine, highly suspicious for ankylosing spondylitis. Electronically Signed   By: Earle Gell M.D.   On: 03/24/2017 13:09    Procedures Procedures (including critical care time)  Medications Ordered in ED Medications  LORazepam (ATIVAN) tablet 0.5 mg (0.5 mg Oral Given 03/24/17 1523)  sodium chloride 0.9 % bolus 1,000 mL (1,000 mLs Intravenous New Bag/Given 03/24/17 1523)     Initial Impression / Assessment and Plan / ED Course  I have reviewed the triage vital signs and the nursing notes.  Pertinent labs & imaging results that were available during my care of the patient were reviewed by me and considered in my medical decision making (see chart for details).     PT w/ 2d non-exertional brief episodes of chest pain associated with anxiety and decreased appetite, ran out of benzos.he was anxious but otherwise well-appearing with stable vital signs at presentation. No ischemia on EKG. Troponins negative, sodium is 124 which is similar to previous labs documenting chronic hyponatremia. Chest x-ray negative acute.Gave the patient an IV fluid bolus because of his report of decreased intake. I have recommended that he follow closely with his PCP for his medications as well as to repeat his sodium level. Most of his symptoms regarding his chest pain around his complaints of anxiety and alcohol withdrawal. He states that Librium has worked well in the past and I provided him with another course. His description is very atypical for  ACS PE, or other life-threatening cause. Extensively reviewed return precautions and patient discharged in satisfactory condition.  Final Clinical Impressions(s) / ED Diagnoses   Final diagnoses:  Chest pain, unspecified type  Anxiety    New Prescriptions New Prescriptions   CHLORDIAZEPOXIDE (LIBRIUM) 25 MG CAPSULE    33m  PO TID x 1D, then 25-72m PO BID X 1D, then 25-585mPO QD X 1D     Eron Staat, RaWenda OverlandMD 03/24/17 [id number]

## 2017-03-24 NOTE — ED Triage Notes (Signed)
Pt presents 2 day h/o L sided chest pain.  Pt reports pain is intermittent and radiates to L scapula.  +shortness of breath and dizziness; pt reports he is under a lot of stress.

## 2017-04-11 ENCOUNTER — Emergency Department (HOSPITAL_COMMUNITY)
Admission: EM | Admit: 2017-04-11 | Discharge: 2017-04-11 | Disposition: A | Discharge status: Home / Self Care | Payer: Medicaid Other | Attending: Emergency Medicine | Admitting: Emergency Medicine

## 2017-04-11 ENCOUNTER — Encounter (HOSPITAL_COMMUNITY): Payer: Self-pay | Admitting: Emergency Medicine

## 2017-04-11 DIAGNOSIS — I10 Essential (primary) hypertension: Secondary | ICD-10-CM | POA: Insufficient documentation

## 2017-04-11 DIAGNOSIS — Z79899 Other long term (current) drug therapy: Secondary | ICD-10-CM | POA: Diagnosis not present

## 2017-04-11 DIAGNOSIS — D75839 Thrombocytosis, unspecified: Secondary | ICD-10-CM

## 2017-04-11 DIAGNOSIS — K529 Noninfective gastroenteritis and colitis, unspecified: Secondary | ICD-10-CM | POA: Insufficient documentation

## 2017-04-11 DIAGNOSIS — D473 Essential (hemorrhagic) thrombocythemia: Secondary | ICD-10-CM | POA: Diagnosis not present

## 2017-04-11 DIAGNOSIS — Z87891 Personal history of nicotine dependence: Secondary | ICD-10-CM | POA: Diagnosis not present

## 2017-04-11 DIAGNOSIS — F419 Anxiety disorder, unspecified: Secondary | ICD-10-CM

## 2017-04-11 DIAGNOSIS — R419 Unspecified symptoms and signs involving cognitive functions and awareness: Secondary | ICD-10-CM | POA: Diagnosis not present

## 2017-04-11 DIAGNOSIS — R252 Cramp and spasm: Secondary | ICD-10-CM | POA: Insufficient documentation

## 2017-04-11 DIAGNOSIS — E871 Hypo-osmolality and hyponatremia: Secondary | ICD-10-CM

## 2017-04-11 DIAGNOSIS — R197 Diarrhea, unspecified: Secondary | ICD-10-CM | POA: Diagnosis present

## 2017-04-11 LAB — URINALYSIS, ROUTINE W REFLEX MICROSCOPIC
BACTERIA UA: NONE SEEN
BILIRUBIN URINE: NEGATIVE
Glucose, UA: NEGATIVE mg/dL
KETONES UR: NEGATIVE mg/dL
LEUKOCYTES UA: NEGATIVE
Nitrite: NEGATIVE
PROTEIN: NEGATIVE mg/dL
SQUAMOUS EPITHELIAL / LPF: NONE SEEN
Specific Gravity, Urine: 1.003 — ABNORMAL LOW (ref 1.005–1.030)
pH: 7 (ref 5.0–8.0)

## 2017-04-11 LAB — CBC
HEMATOCRIT: 45.9 % (ref 39.0–52.0)
HEMOGLOBIN: 15.5 g/dL (ref 13.0–17.0)
MCH: 30.5 pg (ref 26.0–34.0)
MCHC: 33.8 g/dL (ref 30.0–36.0)
MCV: 90.4 fL (ref 78.0–100.0)
Platelets: 402 10*3/uL — ABNORMAL HIGH (ref 150–400)
RBC: 5.08 MIL/uL (ref 4.22–5.81)
RDW: 12.5 % (ref 11.5–15.5)
WBC: 10.2 10*3/uL (ref 4.0–10.5)

## 2017-04-11 LAB — COMPREHENSIVE METABOLIC PANEL
ALT: 24 U/L (ref 17–63)
ANION GAP: 10 (ref 5–15)
AST: 36 U/L (ref 15–41)
Albumin: 4.1 g/dL (ref 3.5–5.0)
Alkaline Phosphatase: 97 U/L (ref 38–126)
CHLORIDE: 91 mmol/L — AB (ref 101–111)
CO2: 30 mmol/L (ref 22–32)
Calcium: 9.1 mg/dL (ref 8.9–10.3)
Creatinine, Ser: 0.9 mg/dL (ref 0.61–1.24)
Glucose, Bld: 122 mg/dL — ABNORMAL HIGH (ref 65–99)
POTASSIUM: 4.6 mmol/L (ref 3.5–5.1)
Sodium: 131 mmol/L — ABNORMAL LOW (ref 135–145)
Total Bilirubin: 0.7 mg/dL (ref 0.3–1.2)
Total Protein: 7.6 g/dL (ref 6.5–8.1)

## 2017-04-11 LAB — LIPASE, BLOOD: LIPASE: 18 U/L (ref 11–51)

## 2017-04-11 MED ORDER — KETOROLAC TROMETHAMINE 30 MG/ML IJ SOLN
30.0000 mg | Freq: Once | INTRAMUSCULAR | Status: AC
  Administered 2017-04-11: 30 mg via INTRAVENOUS
  Filled 2017-04-11: qty 1

## 2017-04-11 MED ORDER — PANTOPRAZOLE SODIUM 40 MG PO TBEC
40.0000 mg | DELAYED_RELEASE_TABLET | Freq: Once | ORAL | Status: AC
  Administered 2017-04-11: 40 mg via ORAL
  Filled 2017-04-11: qty 1

## 2017-04-11 MED ORDER — THIAMINE HCL 100 MG/ML IJ SOLN
Freq: Once | INTRAVENOUS | Status: DC
  Filled 2017-04-11: qty 1000

## 2017-04-11 MED ORDER — THIAMINE HCL 100 MG/ML IJ SOLN
Freq: Once | INTRAVENOUS | Status: AC
  Administered 2017-04-11: 15:00:00 via INTRAVENOUS
  Filled 2017-04-11: qty 1000

## 2017-04-11 MED ORDER — MAGNESIUM SULFATE 2 GM/50ML IV SOLN: 1.0000 g | Freq: Once | INTRAVENOUS | Status: DC

## 2017-04-11 NOTE — ED Triage Notes (Signed)
Patient c/o diarrhea x 4 days and feels that he is dehydrated since hasnt been eating and drinking and having muscle cramps.

## 2017-04-11 NOTE — ED Notes (Signed)
Pt given [place] sandwich and Sprite for PO challenge.

## 2017-04-11 NOTE — Discharge Instructions (Signed)
Your work up today has been reassuring. Use all of your usual home medications as directed by your doctor. Alternate between tylenol and motrin as needed for pain. May consider using over the counter tums, maalox, pepto bismol, or other over the counter remedies to help with symptoms. Stay well hydrated with small sips of fluids throughout the day. Follow a BRAT (banana-rice-applesauce-toast) diet as described below for the next 24-48 hours. The 'BRAT' diet is suggested, then progress to diet as tolerated as symptoms abate. Call your regular doctor if bloody stools, persistent diarrhea, vomiting, fever or abdominal pain. Follow up with your regular doctor in 1 week for recheck of symptoms. Return to ER for changing or worsening of symptoms.

## 2017-04-11 NOTE — ED Provider Notes (Signed)
[facility] DEPT Provider Note   CSN: [id number] Arrival date & time: 04/11/17  1122     History   Chief Complaint Chief Complaint  Patient presents with  . Diarrhea  . Generalized Body Aches    HPI Anthony Lamb is a 61 y.o. male with a PMHx of chronic alcoholism, anemia, colitis, IBS, GERD, HTN, SIADH, and other conditions listed below, who presents to the ED with complaints of diarrhea times 4 days, and states he "feels dehydrated".  He states he has had 3-4 episodes daily of nonbloody watery diarrhea, has been taking Imodium with relief, and eating anything aggravates his symptoms.  He is also having muscle cramps. He states he recently moved to an apartment and does not have very much to eat at home, has a diminished appetite because of this. He also states he feels anxious and depressed because of his life situation. He is requesting a [place] sandwich, and to have his home meds given to him.  He admits that he drank 2 beers last night, and drinks on a somewhat frequent occasion. Chart review reveals he was seen in the ED for the same complaints on 03/13/17 and was given 1L NS and thiamine/Mg with relief of his symptoms; prior to that visit, he was seen on 02/26/17 for abd pain/diarrhea and had a CT abd/pelv which was unremarkable.   He denies fevers, chills, CP, SOB, abd pain, nausea/vomiting, constipation, obstipation, melena, hematochezia, hematuria, dysuria, arthralgias, numbness, tingling, focal weakness, SI, HI, AVH, or any other complaints at this time. Denies illicit drug use, recent travel, sick contacts, suspicious food intake, or frequent NSAID use.    The history is provided by the patient and medical records. No language interpreter was used.  Diarrhea   This is a recurrent problem. The current episode started more than 2 days ago. The problem occurs 2 to 4 times per day. The problem has not changed since onset.The stool consistency is  described as watery. There has been no fever. Associated symptoms include myalgias (muscle cramps). Pertinent negatives include no abdominal pain, no vomiting, no chills and no arthralgias. He has tried anti-motility drugs for the symptoms. The treatment provided moderate relief. His past medical history is significant for irritable bowel syndrome.    Past Medical History:  Diagnosis Date  . Alcohol abuse, in remission   . Anemia   . Anxiety   . Chronic pain   . Colitis   . Depression   . Esophagitis   . Gastritis   . GERD (gastroesophageal reflux disease)   . Hypertension   . IBS (irritable bowel syndrome)   . Poor dentition     Patient Active Problem List   Diagnosis Date Noted  . Dehydration 12/13/2016  . Alcohol abuse   . Depression   . SOB (shortness of breath)   . Chest pain 11/11/2016  . Anxiety   . Gastroesophageal reflux disease   . Osteopenia determined by x-ray 08/06/2014  . Stress fracture of calcaneus 08/06/2014  . Vitamin D deficiency 12/18/2013  . Dementia 12/18/2013  . Benign microscopic hematuria 07/06/2013  . SIADH (syndrome of inappropriate ADH production) ([place]) 06/22/2013  . Hyponatremia 06/20/2013  . Ankylosing spondylitis ([place]) 06/20/2013  . Malnutrition of moderate degree ([place]) 06/20/2013  . Depression with anxiety 05/31/2013  . BPH (benign prostatic hyperplasia) 08/22/2010  . Backache 04/19/2010  . ALCOHOLISM 08/13/2008  . History of colonic polyps 08/13/2008  . Essential hypertension 07/03/2007  . PUD (peptic ulcer  disease) 07/03/2007  . Irritable bowel syndrome 07/03/2007    Past Surgical History:  Procedure Laterality Date  . COLONOSCOPY W/ BIOPSIES    . ESOPHAGOGASTRODUODENOSCOPY    . HERNIA REPAIR Bilateral   . TONSILLECTOMY         Home Medications    Prior to Admission medications   Medication Sig Start Date End Date Taking? Authorizing Provider  amLODipine (NORVASC) 5 MG tablet Take 1 tablet (5 mg total) by mouth  daily. Patient not taking: Reported on 02/15/2017 12/14/16 12/14/17  Kathrene Alu, MD  atorvastatin (LIPITOR) 20 MG tablet Take 1 tablet (20 mg total) by mouth daily at 6 PM. Patient not taking: Reported on 02/15/2017 11/12/16   Archie Patten, MD  chlordiazePOXIDE (LIBRIUM) 25 MG capsule 87m PO TID x 1D, then 25-523mPO BID X 1D, then 25-5058mO QD X 1D 03/24/17   Little, RacWenda OverlandD  Cholecalciferol (VITAMIN D) 2000 units CAPS Take 4,000 Units by mouth daily.     [provider]  clonazePAM (KLONOPIN) 0.5 MG tablet Take 0.5 mg 3 (three) times daily as needed by mouth for anxiety or sleep. 03/27/17   [provider]  dicyclomine (BENTYL) 20 MG tablet Take 1 tablet (20 mg total) by mouth 4 (four) times daily -  before meals and at bedtime. Patient not taking: Reported on 03/13/2017 02/26/17 03/08/17  IsaDuffy BruceD  ferrous sulfate 325 (65 FE) MG tablet Take 325 mg by mouth daily with breakfast.    [provider]  folic acid (FOLVITE) 1 MG tablet Take 1 tablet (1 mg total) by mouth daily. Patient not taking: Reported on 02/15/2017 12/15/16   WinKathrene AluD  hydrOXYzine (ATARAX/VISTARIL) 25 MG tablet Take 1 tablet (25 mg total) by mouth every 8 (eight) hours as needed for anxiety (sleep). 02/26/17   IsaDuffy BruceD  lisinopril-hydrochlorothiazide (PRINZIDE,ZESTORETIC) 10-12.5 MG tablet Take 1 tablet by mouth at bedtime.    [provider]  LORazepam (ATIVAN) 0.5 MG tablet Take 1 tablet (0.5 mg total) by mouth at bedtime. Patient taking differently: Take 0.5 mg by mouth 3 (three) times daily.  11/12/16 11/12/17  DorArchie PattenD  metoCLOPramide (REGLAN) 10 MG tablet Take 1 tablet (10 mg total) by mouth every 6 (six) hours as needed for nausea (nausea/headache). Patient not taking: Reported on 03/13/2017 01/05/17   YaoDrenda FreezeD  pantoprazole (PROTONIX) 40 MG tablet Take 1 tablet (40 mg total) by mouth daily. Patient taking  differently: Take 40 mg by mouth daily with breakfast.  04/24/14   JonJanith LimaD  sodium chloride 1 g tablet Take 1 tablet (1 g total) by mouth daily. For 2 days Patient not taking: Reported on 03/24/2017 03/13/17   HarMargarita MailA-C  sucralfate (CARAFATE) 1 G tablet Take 1 tablet (1 g total) by mouth 4 (four) times daily -  with meals and at bedtime. Chew before swallowing Patient taking differently: Take 1 g by mouth 4 (four) times daily. Chew before swallowing 12/17/13   JonJanith LimaD  thiamine 100 MG tablet Take 1 tablet (100 mg total) by mouth daily. 12/15/16   WinKathrene AluD  verapamil (CALAN-SR) 240 MG CR tablet Take 240 mg by mouth at bedtime. 02/09/17   [provider]  zolpidem (AMBIEN) 10 MG tablet Take 10 mg by mouth at bedtime.    [provider]    Family History Family History  Problem Relation Age of Onset  .  Anxiety disorder Mother   . Congestive Heart Failure Mother   . Crohn's disease Mother   . Arthritis Father   . High blood pressure Father     Social History Social History   Tobacco Use  . Smoking status: Former Smoker    Types: Cigarettes  . Smokeless tobacco: Never Used  Substance Use Topics  . Alcohol use: Yes    Comment: daily/ 3-4 beers  . Drug use: No     Allergies   Diphenhydramine hcl; Flexeril [cyclobenzaprine]; and Nsaids   Review of Systems Review of Systems  Constitutional: Positive for appetite change (decreased due to very little to eat at home). Negative for chills and fever.  Respiratory: Negative for shortness of breath.   Cardiovascular: Negative for chest pain.  Gastrointestinal: Positive for diarrhea. Negative for abdominal pain, blood in stool, constipation, nausea and vomiting.  Genitourinary: Negative for dysuria and hematuria.  Musculoskeletal: Positive for myalgias (muscle cramps). Negative for arthralgias.  Skin: Negative for color change.  Allergic/Immunologic: Negative for  immunocompromised state.  Neurological: Negative for weakness and numbness.  Psychiatric/Behavioral: Negative for confusion, hallucinations and suicidal ideas. The patient is nervous/anxious.    All other systems reviewed and are negative for acute change except as noted in the HPI.    Physical Exam Updated Vital Signs BP 131/88 (BP Location: Right Arm)   Pulse 77   Temp 98.2 F (36.8 C) (Oral)   Resp 17   Ht 5' 11"  (1.803 m)   Wt 87.5 kg (193 lb)   SpO2 98%   BMI 26.92 kg/m   Physical Exam  Constitutional: He is oriented to person, place, and time. Vital signs are normal. He appears well-developed and well-nourished.  Non-toxic appearance. No distress.  Afebrile, nontoxic, NAD  HENT:  Head: Normocephalic and atraumatic.  Mouth/Throat: Oropharynx is clear and moist and mucous membranes are normal.  Eyes: Conjunctivae and EOM are normal. Right eye exhibits no discharge. Left eye exhibits no discharge.  Neck: Normal range of motion. Neck supple.  Cardiovascular: Normal rate, regular rhythm, normal heart sounds and intact distal pulses. Exam reveals no gallop and no friction rub.  No murmur heard. Pulmonary/Chest: Effort normal and breath sounds normal. No respiratory distress. He has no decreased breath sounds. He has no wheezes. He has no rhonchi. He has no rales.  Abdominal: Soft. Normal appearance and bowel sounds are normal. He exhibits no distension. There is no tenderness. There is no rigidity, no rebound, no guarding, no CVA tenderness, no tenderness at McBurney's point and negative Murphy's sign.  Soft, NTND, +BS throughout, no r/g/r, neg murphy's, neg mcburney's, no CVA TTP   Musculoskeletal: Normal range of motion.  Neurological: He is alert and oriented to person, place, and time. He has normal strength. No sensory deficit.  Skin: Skin is warm, dry and intact. No rash noted.  Psychiatric: His mood appears anxious. He is not actively hallucinating. He expresses no homicidal  and no suicidal ideation. He expresses no suicidal plans and no homicidal plans.  Slightly anxious affect, but pleasant and cooperative. Denies SI, HI, or AVH, doesn't seem to be responding to internal stimuli.   Nursing note and vitals reviewed.    ED Treatments / Results  Labs (all labs ordered are listed, but only abnormal results are displayed) Labs Reviewed  COMPREHENSIVE METABOLIC PANEL - Abnormal; Notable for the following components:      Result Value   Sodium 131 (*)    Chloride 91 (*)  Glucose, Bld 122 (*)    BUN <5 (*)    All other components within normal limits  CBC - Abnormal; Notable for the following components:   Platelets 402 (*)    All other components within normal limits  URINALYSIS, ROUTINE W REFLEX MICROSCOPIC - Abnormal; Notable for the following components:   Color, Urine STRAW (*)    Specific Gravity, Urine 1.003 (*)    Hgb urine dipstick SMALL (*)    All other components within normal limits  LIPASE, BLOOD    EKG  EKG Interpretation None       Radiology No results found.  Procedures Procedures (including critical care time)  Medications Ordered in ED Medications  ketorolac (TORADOL) 30 MG/ML injection 30 mg (not administered)  pantoprazole (PROTONIX) EC tablet 40 mg (40 mg Oral Given 04/11/17 1444)  sodium chloride 0.9 % 1,000 mL with thiamine 381 mg, folic acid 1 mg, multivitamins adult 10 mL, magnesium sulfate 1 g infusion ( Intravenous New Bag/Given 04/11/17 1444)     Initial Impression / Assessment and Plan / ED Course  I have reviewed the triage vital signs and the nursing notes.  Pertinent labs & imaging results that were available during my care of the patient were reviewed by me and considered in my medical decision making (see chart for details).     61 y.o. male here with diarrhea and muscle cramps, states he feels dehydrated. Also feels anxious and depressed but denies SI/HI/AVH. Admits to EtOH use. Multiple recent visits  for these complaints, in the past has felt better with banana bag and Mg. Labs today reassuring, CMP with Na 131 which is better than it has been in a while; CBC essentially unremarkable, U/A WNL, lipase WNL. On exam, no abdominal tenderness, pt in NAD, requesting food. Will give protonix since he's requesting this, and give banana bag+Mg supplementation. Will reassess shortly. Doubt need for further imaging/emergent work up currently   4:50 PM Pt feeling better and tolerating PO well. Requesting toradol IV before he leaves because he can't take NSAIDs at home since he has GERD/gastritis, and wants NSAIDs for his ankylosing spondylitis; denies any acute concerns with this, just wants it while he has the IV in place. Otherwise has no ongoing complaints. No acute emergent process appears to be going on at this time. Advised BRAT diet, OTC remedies for symptomatic relief, and continuation of his usual home medications. F/up with PCP in 1wk for recheck and ongoing management of his conditions. I explained the diagnosis and have given explicit precautions to return to the ER including for any other new or worsening symptoms. The patient understands and accepts the medical plan as it's been dictated and I have answered their questions. Discharge instructions concerning home care and prescriptions have been given. The patient is STABLE and is discharged to home in good condition.    Final Clinical Impressions(s) / ED Diagnoses   Final diagnoses:  Chronic diarrhea  Thrombocytosis ([place])  Chronic hyponatremia  Muscle cramps  Anxiety    ED Discharge Orders    [address], [place], [place] 04/11/17 [place], [place], MD 04/17/17 2354

## 2017-04-11 NOTE — ED Notes (Signed)
Pt was concerned about his IV flowing, RN went in to assess IV and it was flowing properly. Pt has also finished his sandwich and PO fluids with no difficulties

## 2017-04-29 ENCOUNTER — Emergency Department (HOSPITAL_COMMUNITY)
Admission: EM | Admit: 2017-04-29 | Discharge: 2017-04-29 | Disposition: A | Discharge status: Home / Self Care | Payer: Medicaid Other | Attending: Emergency Medicine | Admitting: Emergency Medicine

## 2017-04-29 ENCOUNTER — Encounter (HOSPITAL_COMMUNITY): Payer: Self-pay

## 2017-04-29 DIAGNOSIS — Z79899 Other long term (current) drug therapy: Secondary | ICD-10-CM | POA: Insufficient documentation

## 2017-04-29 DIAGNOSIS — F0390 Unspecified dementia without behavioral disturbance: Secondary | ICD-10-CM | POA: Diagnosis not present

## 2017-04-29 DIAGNOSIS — R5381 Other malaise: Secondary | ICD-10-CM

## 2017-04-29 DIAGNOSIS — R5383 Other fatigue: Secondary | ICD-10-CM | POA: Insufficient documentation

## 2017-04-29 DIAGNOSIS — I10 Essential (primary) hypertension: Secondary | ICD-10-CM | POA: Insufficient documentation

## 2017-04-29 DIAGNOSIS — E871 Hypo-osmolality and hyponatremia: Secondary | ICD-10-CM | POA: Insufficient documentation

## 2017-04-29 DIAGNOSIS — R531 Weakness: Secondary | ICD-10-CM | POA: Diagnosis present

## 2017-04-29 LAB — URINALYSIS, ROUTINE W REFLEX MICROSCOPIC
Bacteria, UA: NONE SEEN
Bilirubin Urine: NEGATIVE
Glucose, UA: NEGATIVE mg/dL
Ketones, ur: NEGATIVE mg/dL
Nitrite: NEGATIVE
Protein, ur: NEGATIVE mg/dL
Specific Gravity, Urine: 1.014 (ref 1.005–1.030)
Squamous Epithelial / LPF: NONE SEEN
pH: 8 (ref 5.0–8.0)

## 2017-04-29 LAB — COMPREHENSIVE METABOLIC PANEL
ALT: 20 U/L (ref 17–63)
AST: 43 U/L — ABNORMAL HIGH (ref 15–41)
Albumin: 3.7 g/dL (ref 3.5–5.0)
Alkaline Phosphatase: 86 U/L (ref 38–126)
Anion gap: 10 (ref 5–15)
BUN: <5 mg/dL — ABNORMAL LOW (ref 6–20)
CO2: 25 mmol/L (ref 22–32)
Calcium: 8.8 mg/dL — ABNORMAL LOW (ref 8.9–10.3)
Chloride: 87 mmol/L — ABNORMAL LOW (ref 101–111)
Creatinine, Ser: 0.76 mg/dL (ref 0.61–1.24)
GFR calc Af Amer: >60 mL/min (ref >60)
GFR calc non Af Amer: >60 mL/min (ref >60)
Glucose, Bld: 113 mg/dL — ABNORMAL HIGH (ref 65–99)
Potassium: 4.7 mmol/L (ref 3.5–5.1)
Sodium: 122 mmol/L — ABNORMAL LOW (ref 135–145)
Total Bilirubin: 1 mg/dL (ref 0.3–1.2)
Total Protein: 6.7 g/dL (ref 6.5–8.1)

## 2017-04-29 LAB — CBC
HCT: 43.5 % (ref 39.0–52.0)
Hemoglobin: 15.3 g/dL (ref 13.0–17.0)
MCH: 31 pg (ref 26.0–34.0)
MCHC: 35.2 g/dL (ref 30.0–36.0)
MCV: 88.2 fL (ref 78.0–100.0)
Platelets: 320 10*3/uL (ref 150–400)
RBC: 4.93 MIL/uL (ref 4.22–5.81)
RDW: 12.7 % (ref 11.5–15.5)
WBC: 10.9 10*3/uL — ABNORMAL HIGH (ref 4.0–10.5)

## 2017-04-29 LAB — LIPASE, BLOOD: Lipase: 24 U/L (ref 11–51)

## 2017-04-29 MED ORDER — LACTATED RINGERS IV BOLUS (SEPSIS)
1000.0000 mL | Freq: Once | INTRAVENOUS | Status: AC
  Administered 2017-04-29: 1000 mL via INTRAVENOUS

## 2017-04-29 MED ORDER — ONDANSETRON HCL 4 MG/2ML IJ SOLN
4.0000 mg | Freq: Once | INTRAMUSCULAR | Status: AC
  Administered 2017-04-29: 4 mg via INTRAVENOUS
  Filled 2017-04-29: qty 2

## 2017-04-29 MED ORDER — LORAZEPAM 2 MG/ML IJ SOLN: 1.0000 mg | Freq: Once | INTRAMUSCULAR | Status: DC

## 2017-04-29 NOTE — ED Notes (Signed)
Please call Elsie Am[facility] (Family Friend) to pick up pt [phone]

## 2017-04-29 NOTE — ED Notes (Signed)
Blood was attempted to be collected twice on the Pt (first time Right AC second on left forearm)--unsuccessful both times. Veins are palpable but Pt is likely too dehydrated.

## 2017-04-29 NOTE — ED Notes (Signed)
Pt reports taking home Ativan few minutes ago.  Dr. Wilson Singer made aware, orders obtained to d/c Ativan.

## 2017-04-29 NOTE — ED Notes (Signed)
Pt given [place] sandwiches and Sprite.

## 2017-04-29 NOTE — ED Notes (Signed)
Pt tolerated sandwich, sprite, and applesauce.  No c/o nausea.

## 2017-04-29 NOTE — ED Triage Notes (Signed)
Per Pt, Pt is coming from home with complaints of headache and "not being able to eat right" for the past three days. Reports hx of colitis with diarrhea. Denies any abdominal pain or vomiting.

## 2017-05-01 NOTE — ED Provider Notes (Signed)
[facility] EMERGENCY DEPARTMENT Provider Note   CSN: [id number] Arrival date & time: 04/29/17  1308     History   Chief Complaint Chief Complaint  Patient presents with  . Dehydration    HPI Anthony Lamb is a 61 y.o. male.  HPI   60 year old male with multiple complaints.  Says he has been having intermittent headaches for the past several days.  Feels generally tired/weak.  Anorexia.  Has continued to drink alcohol.  He feels like he may be dehydrated.  No fevers or chills.  No urinary complaints.  Past Medical History:  Diagnosis Date  . Alcohol abuse, in remission   . Anemia   . Anxiety   . Chronic pain   . Colitis   . Depression   . Esophagitis   . Gastritis   . GERD (gastroesophageal reflux disease)   . Hypertension   . IBS (irritable bowel syndrome)   . Poor dentition     Patient Active Problem List   Diagnosis Date Noted  . Dehydration 12/13/2016  . Alcohol abuse   . Depression   . SOB (shortness of breath)   . Chest pain 11/11/2016  . Anxiety   . Gastroesophageal reflux disease   . Osteopenia determined by x-ray 08/06/2014  . Stress fracture of calcaneus 08/06/2014  . Vitamin D deficiency 12/18/2013  . Dementia 12/18/2013  . Benign microscopic hematuria 07/06/2013  . SIADH (syndrome of inappropriate ADH production) ([place]) 06/22/2013  . Hyponatremia 06/20/2013  . Ankylosing spondylitis ([place]) 06/20/2013  . Malnutrition of moderate degree ([place]) 06/20/2013  . Depression with anxiety 05/31/2013  . BPH (benign prostatic hyperplasia) 08/22/2010  . Backache 04/19/2010  . ALCOHOLISM 08/13/2008  . History of colonic polyps 08/13/2008  . Essential hypertension 07/03/2007  . PUD (peptic ulcer disease) 07/03/2007  . Irritable bowel syndrome 07/03/2007    Past Surgical History:  Procedure Laterality Date  . COLONOSCOPY W/ BIOPSIES    . ESOPHAGOGASTRODUODENOSCOPY    . HERNIA REPAIR Bilateral   . TONSILLECTOMY         Home  Medications    Prior to Admission medications   Medication Sig Start Date End Date Taking? Authorizing Provider  amLODipine (NORVASC) 5 MG tablet Take 1 tablet (5 mg total) by mouth daily. Patient not taking: Reported on 02/15/2017 12/14/16 12/14/17  Kathrene Alu, MD  atorvastatin (LIPITOR) 20 MG tablet Take 1 tablet (20 mg total) by mouth daily at 6 PM. Patient not taking: Reported on 02/15/2017 11/12/16   Archie Patten, MD  chlordiazePOXIDE (LIBRIUM) 25 MG capsule 76m PO TID x 1D, then 25-523mPO BID X 1D, then 25-5050mO QD X 1D Patient not taking: Reported on 04/11/2017 03/24/17   Little, RacWenda OverlandD  Cholecalciferol (VITAMIN D) 2000 units CAPS Take 4,000 Units by mouth daily.     [provider]  clonazePAM (KLONOPIN) 0.5 MG tablet Take 0.5 mg 3 (three) times daily as needed by mouth for anxiety or sleep. 03/27/17   [provider]  dicyclomine (BENTYL) 20 MG tablet Take 1 tablet (20 mg total) by mouth 4 (four) times daily -  before meals and at bedtime. Patient not taking: Reported on 03/13/2017 02/26/17 03/08/17  IsaDuffy BruceD  ferrous sulfate 325 (65 FE) MG tablet Take 325 mg by mouth daily with breakfast.    [provider]  folic acid (FOLVITE) 1 MG tablet Take 1 tablet (1 mg total) by mouth daily. Patient not taking: Reported on 02/15/2017  12/15/16   Kathrene Alu, MD  hydrOXYzine (ATARAX/VISTARIL) 25 MG tablet Take 1 tablet (25 mg total) by mouth every 8 (eight) hours as needed for anxiety (sleep). 02/26/17   Duffy Bruce, MD  lisinopril-hydrochlorothiazide (PRINZIDE,ZESTORETIC) 10-12.5 MG tablet Take 1 tablet by mouth at bedtime.    [provider]  LORazepam (ATIVAN) 0.5 MG tablet Take 1 tablet (0.5 mg total) by mouth at bedtime. Patient taking differently: Take 0.5 mg by mouth 3 (three) times daily.  11/12/16 11/12/17  Archie Patten, MD  metoCLOPramide (REGLAN) 10 MG tablet Take 1 tablet (10 mg total) by mouth every 6 (six)  hours as needed for nausea (nausea/headache). Patient not taking: Reported on 03/13/2017 01/05/17   Drenda Freeze, MD  pantoprazole (PROTONIX) 40 MG tablet Take 1 tablet (40 mg total) by mouth daily. Patient taking differently: Take 40 mg by mouth daily with breakfast.  04/24/14   Janith Lima, MD  sodium chloride 1 g tablet Take 1 tablet (1 g total) by mouth daily. For 2 days Patient not taking: Reported on 03/24/2017 03/13/17   Margarita Mail, PA-C  sucralfate (CARAFATE) 1 G tablet Take 1 tablet (1 g total) by mouth 4 (four) times daily -  with meals and at bedtime. Chew before swallowing Patient taking differently: Take 1 g by mouth 4 (four) times daily. Chew before swallowing 12/17/13   Janith Lima, MD  thiamine 100 MG tablet Take 1 tablet (100 mg total) by mouth daily. 12/15/16   Kathrene Alu, MD  verapamil (CALAN-SR) 240 MG CR tablet Take 240 mg by mouth at bedtime. 02/09/17   [provider]  zolpidem (AMBIEN) 10 MG tablet Take 10 mg by mouth at bedtime.    [provider]    Family History Family History  Problem Relation Age of Onset  . Anxiety disorder Mother   . Congestive Heart Failure Mother   . Crohn's disease Mother   . Arthritis Father   . High blood pressure Father     Social History Social History   Tobacco Use  . Smoking status: Former Smoker    Types: Cigarettes  . Smokeless tobacco: Never Used  Substance Use Topics  . Alcohol use: Yes    Comment: daily/ 3-4 beers  . Drug use: No     Allergies   Diphenhydramine hcl; Flexeril [cyclobenzaprine]; and Nsaids   Review of Systems Review of Systems  All systems reviewed and negative, other than as noted in HPI.   Physical Exam Updated Vital Signs BP (!) 158/80 (BP Location: Right Arm)   Pulse 88   Temp 98 F (36.7 C) (Oral)   Resp 16   Ht 5' 11"  (1.803 m)   Wt 87.5 kg (193 lb)   SpO2 98%   BMI 26.92 kg/m   Physical Exam  Constitutional: He appears well-developed and  well-nourished. No distress.  HENT:  Head: Normocephalic and atraumatic.  Eyes: Conjunctivae are normal. Right eye exhibits no discharge. Left eye exhibits no discharge.  Neck: Neck supple.  Cardiovascular: Normal rate, regular rhythm and normal heart sounds. Exam reveals no gallop and no friction rub.  No murmur heard. Pulmonary/Chest: Effort normal and breath sounds normal. No respiratory distress.  Abdominal: Soft. He exhibits no distension. There is no tenderness.  Musculoskeletal: He exhibits no edema or tenderness.  Neurological: He is alert.  Skin: Skin is warm and dry.  Psychiatric: He has a normal mood and affect. His behavior is normal. Thought content normal.  Nursing note and vitals reviewed.    ED Treatments / Results  Labs (all labs ordered are listed, but only abnormal results are displayed) Labs Reviewed  COMPREHENSIVE METABOLIC PANEL - Abnormal; Notable for the following components:      Result Value   Sodium 122 (*)    Chloride 87 (*)    Glucose, Bld 113 (*)    BUN <5 (*)    Calcium 8.8 (*)    AST 43 (*)    All other components within normal limits  CBC - Abnormal; Notable for the following components:   WBC 10.9 (*)    All other components within normal limits  URINALYSIS, ROUTINE W REFLEX MICROSCOPIC - Abnormal; Notable for the following components:   Hgb urine dipstick SMALL (*)    Leukocytes, UA MODERATE (*)    All other components within normal limits  LIPASE, BLOOD    EKG  EKG Interpretation None       Radiology No results found.  Procedures Procedures (including critical care time)  Medications Ordered in ED Medications  lactated ringers bolus 1,000 mL (0 mLs Intravenous Stopped 04/29/17 1632)  ondansetron (ZOFRAN) injection 4 mg (4 mg Intravenous Given 04/29/17 1454)     Initial Impression / Assessment and Plan / ED Course  I have reviewed the triage vital signs and the nursing notes.  Pertinent labs & imaging results that were  available during my care of the patient were reviewed by me and considered in my medical decision making (see chart for details).     61 year old male with multiple complaints which are vague.  He feels like he is dehydrated.  Clinically he has not.  Hyponatremia which seems chronic.  Delutional from alcohol use?  He requested something to eat which she consumed quite readily.  He says he feels much better.  I question some anxiety component.  Doubt emergent process.  Advised to significantly come back on his alcohol consumption, preferably stop.  Final Clinical Impressions(s) / ED Diagnoses   Final diagnoses:  Malaise and fatigue    ED Discharge Orders    None       Virgel Manifold, MD 05/01/17 1037

## 2017-05-02 ENCOUNTER — Other Ambulatory Visit: Payer: Self-pay

## 2017-05-02 ENCOUNTER — Emergency Department (HOSPITAL_COMMUNITY)
Admission: EM | Admit: 2017-05-02 | Discharge: 2017-05-02 | Disposition: A | Discharge status: Home / Self Care | Payer: Medicaid Other | Attending: Emergency Medicine | Admitting: Emergency Medicine

## 2017-05-02 ENCOUNTER — Encounter (HOSPITAL_COMMUNITY): Payer: Self-pay | Admitting: *Deleted

## 2017-05-02 DIAGNOSIS — R42 Dizziness and giddiness: Secondary | ICD-10-CM | POA: Insufficient documentation

## 2017-05-02 DIAGNOSIS — Z87891 Personal history of nicotine dependence: Secondary | ICD-10-CM | POA: Diagnosis not present

## 2017-05-02 DIAGNOSIS — F0390 Unspecified dementia without behavioral disturbance: Secondary | ICD-10-CM | POA: Diagnosis not present

## 2017-05-02 DIAGNOSIS — Z79899 Other long term (current) drug therapy: Secondary | ICD-10-CM | POA: Insufficient documentation

## 2017-05-02 DIAGNOSIS — I10 Essential (primary) hypertension: Secondary | ICD-10-CM | POA: Insufficient documentation

## 2017-05-02 LAB — URINALYSIS, ROUTINE W REFLEX MICROSCOPIC
Bacteria, UA: NONE SEEN
Bilirubin Urine: NEGATIVE
GLUCOSE, UA: NEGATIVE mg/dL
Ketones, ur: NEGATIVE mg/dL
Leukocytes, UA: NEGATIVE
Nitrite: NEGATIVE
PH: 7 (ref 5.0–8.0)
Protein, ur: NEGATIVE mg/dL
SQUAMOUS EPITHELIAL / LPF: NONE SEEN
Specific Gravity, Urine: 1.009 (ref 1.005–1.030)

## 2017-05-02 LAB — BASIC METABOLIC PANEL
Anion gap: 12 (ref 5–15)
BUN: <5 mg/dL — ABNORMAL LOW (ref 6–20)
CHLORIDE: 86 mmol/L — AB (ref 101–111)
CO2: 28 mmol/L (ref 22–32)
Calcium: 9.6 mg/dL (ref 8.9–10.3)
Creatinine, Ser: 0.81 mg/dL (ref 0.61–1.24)
GFR calc Af Amer: >60 mL/min (ref >60)
GFR calc non Af Amer: >60 mL/min (ref >60)
GLUCOSE: 97 mg/dL (ref 65–99)
POTASSIUM: 5 mmol/L (ref 3.5–5.1)
Sodium: 126 mmol/L — ABNORMAL LOW (ref 135–145)

## 2017-05-02 LAB — CBC WITH DIFFERENTIAL/PLATELET
Basophils Absolute: 0 10*3/uL (ref 0.0–0.1)
Basophils Relative: 0 %
EOS PCT: 0 %
Eosinophils Absolute: 0 10*3/uL (ref 0.0–0.7)
HCT: 46.6 % (ref 39.0–52.0)
Hemoglobin: 16.5 g/dL (ref 13.0–17.0)
LYMPHS PCT: 11 %
Lymphs Abs: 1.8 10*3/uL (ref 0.7–4.0)
MCH: 30.7 pg (ref 26.0–34.0)
MCHC: 35.4 g/dL (ref 30.0–36.0)
MCV: 86.8 fL (ref 78.0–100.0)
MONO ABS: 1.2 10*3/uL — AB (ref 0.1–1.0)
Monocytes Relative: 7 %
Neutro Abs: 13.5 10*3/uL — ABNORMAL HIGH (ref 1.7–7.7)
Neutrophils Relative %: 82 %
PLATELETS: 382 10*3/uL (ref 150–400)
RBC: 5.37 MIL/uL (ref 4.22–5.81)
RDW: 12.5 % (ref 11.5–15.5)
WBC: 16.5 10*3/uL — ABNORMAL HIGH (ref 4.0–10.5)

## 2017-05-02 MED ORDER — ONDANSETRON 4 MG PO TBDP
4.0000 mg | ORAL_TABLET | Freq: Once | ORAL | Status: AC
  Administered 2017-05-02: 4 mg via ORAL
  Filled 2017-05-02: qty 1

## 2017-05-02 MED ORDER — AMLODIPINE BESYLATE 5 MG PO TABS
5.0000 mg | ORAL_TABLET | Freq: Once | ORAL | Status: AC
  Administered 2017-05-02: 5 mg via ORAL
  Filled 2017-05-02: qty 1

## 2017-05-02 MED ORDER — LORAZEPAM 1 MG PO TABS
0.5000 mg | ORAL_TABLET | Freq: Once | ORAL | Status: AC
  Administered 2017-05-02: 0.5 mg via ORAL
  Filled 2017-05-02: qty 1

## 2017-05-02 MED ORDER — MECLIZINE HCL 12.5 MG PO TABS: 12.5000 mg | ORAL_TABLET | Freq: Three times a day (TID) | ORAL | 0 refills | Status: DC | PRN

## 2017-05-02 MED ORDER — SODIUM CHLORIDE 0.9 % IV BOLUS (SEPSIS)
1000.0000 mL | Freq: Once | INTRAVENOUS | Status: AC
  Administered 2017-05-02: 1000 mL via INTRAVENOUS

## 2017-05-02 NOTE — Discharge Instructions (Signed)
Please read the attached information regarding your condition. Take meclizine as needed for your dizziness and vertigo. Continue your home medications as previously prescribed. Follow-up with your primary care provider for further evaluation. Return to ED for worsening dizziness, falls, head injuries, vision changes, chest pain or shortness of breath.

## 2017-05-02 NOTE — ED Notes (Signed)
Pt ate [place] sandwich sack lunch and 2 cokes plus a glass of water. Pt states he has no ill effects.

## 2017-05-02 NOTE — ED Provider Notes (Signed)
[facility] EMERGENCY DEPARTMENT Provider Note   CSN: [id number] Arrival date & time: 05/02/17  1206     History   Chief Complaint Chief Complaint  Patient presents with  . Otalgia  . Dizziness    HPI Anthony Lamb is a 61 y.o. male with a past medical history of alcohol abuse, GERD, IBS, anxiety, who presents to ED for evaluation of multiple complaints. He reports he had an episode yesterday where he felt dizzy like the world was spinning around him, followed by 3 episodes of NBNB emesis. He took his home Reglan with complete resolution of symptoms. He is here today because he is having sinus pressure, L ear pain and feels like he is dehydrated. He states he does not have much to eat at home. He reports sinus pain in the past which he attributes to moving to a new apartment and weather change. He denies any falls, head injuries, loss of consciousness, hematemesis, bowel changes (other than r/t his IBS), urinary symptoms, chest pain, abdominal pain.  HPI  Past Medical History:  Diagnosis Date  . Alcohol abuse, in remission   . Anemia   . Anxiety   . Chronic pain   . Colitis   . Depression   . Esophagitis   . Gastritis   . GERD (gastroesophageal reflux disease)   . Hypertension   . IBS (irritable bowel syndrome)   . Poor dentition     Patient Active Problem List   Diagnosis Date Noted  . Dehydration 12/13/2016  . Alcohol abuse   . Depression   . SOB (shortness of breath)   . Chest pain 11/11/2016  . Anxiety   . Gastroesophageal reflux disease   . Osteopenia determined by x-ray 08/06/2014  . Stress fracture of calcaneus 08/06/2014  . Vitamin D deficiency 12/18/2013  . Dementia 12/18/2013  . Benign microscopic hematuria 07/06/2013  . 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 Hyponatremia 06/20/2013  . Ankylosing spondylitis ([place]) 06/20/2013  . Malnutrition of moderate degree ([place]) 06/20/2013  . Depression with  anxiety 05/31/2013  . BPH (benign prostatic hyperplasia) 08/22/2010  . Backache 04/19/2010  . ALCOHOLISM 08/13/2008  . History of colonic polyps 08/13/2008  . Essential hypertension 07/03/2007  . PUD (peptic ulcer disease) 07/03/2007  . Irritable bowel syndrome 07/03/2007    Past Surgical History:  Procedure Laterality Date  . COLONOSCOPY W/ BIOPSIES    . ESOPHAGOGASTRODUODENOSCOPY    . HERNIA REPAIR Bilateral   . TONSILLECTOMY         Home Medications    Prior to Admission medications   Medication Sig Start Date End Date Taking? Authorizing Provider  amLODipine (NORVASC) 5 MG tablet Take 1 tablet (5 mg total) by mouth daily. Patient not taking: Reported on 02/15/2017 12/14/16 12/14/17  Kathrene Alu, MD  atorvastatin (LIPITOR) 20 MG tablet Take 1 tablet (20 mg total) by mouth daily at 6 PM. Patient not taking: Reported on 02/15/2017 11/12/16   Archie Patten, MD  chlordiazePOXIDE (LIBRIUM) 25 MG capsule 26m PO TID x 1D, then 25-565mPO BID X 1D, then 25-5053mO QD X 1D Patient not taking: Reported on 04/11/2017 03/24/17   Little, RacWenda OverlandD  Cholecalciferol (VITAMIN D) 2000 units CAPS Take 4,000 Units by mouth daily.     [provider]  clonazePAM (KLONOPIN) 0.5 MG tablet Take 0.5 mg 3 (three) times daily as needed by mouth for anxiety or sleep. 03/27/17   [provider]  dicyclomine (BENTYL) 20 MG tablet Take 1 tablet (20 mg total) by mouth 4 (four) times daily -  before meals and at bedtime. Patient not taking: Reported on 03/13/2017 02/26/17 03/08/17  Duffy Bruce, MD  ferrous sulfate 325 (65 FE) MG tablet Take 325 mg by mouth daily with breakfast.    [provider]  folic acid (FOLVITE) 1 MG tablet Take 1 tablet (1 mg total) by mouth daily. Patient not taking: Reported on 02/15/2017 12/15/16   Kathrene Alu, MD  hydrOXYzine (ATARAX/VISTARIL) 25 MG tablet Take 1 tablet (25 mg total) by mouth every 8 (eight) hours as needed for anxiety  (sleep). 02/26/17   Duffy Bruce, MD  lisinopril-hydrochlorothiazide (PRINZIDE,ZESTORETIC) 10-12.5 MG tablet Take 1 tablet by mouth at bedtime.    [provider]  LORazepam (ATIVAN) 0.5 MG tablet Take 1 tablet (0.5 mg total) by mouth at bedtime. Patient taking differently: Take 0.5 mg by mouth 3 (three) times daily.  11/12/16 11/12/17  Archie Patten, MD  metoCLOPramide (REGLAN) 10 MG tablet Take 1 tablet (10 mg total) by mouth every 6 (six) hours as needed for nausea (nausea/headache). Patient not taking: Reported on 03/13/2017 01/05/17   Drenda Freeze, MD  pantoprazole (PROTONIX) 40 MG tablet Take 1 tablet (40 mg total) by mouth daily. Patient taking differently: Take 40 mg by mouth daily with breakfast.  04/24/14   Janith Lima, MD  sodium chloride 1 g tablet Take 1 tablet (1 g total) by mouth daily. For 2 days Patient not taking: Reported on 03/24/2017 03/13/17   Margarita Mail, PA-C  sucralfate (CARAFATE) 1 G tablet Take 1 tablet (1 g total) by mouth 4 (four) times daily -  with meals and at bedtime. Chew before swallowing Patient taking differently: Take 1 g by mouth 4 (four) times daily. Chew before swallowing 12/17/13   Janith Lima, MD  thiamine 100 MG tablet Take 1 tablet (100 mg total) by mouth daily. 12/15/16   Kathrene Alu, MD  verapamil (CALAN-SR) 240 MG CR tablet Take 240 mg by mouth at bedtime. 02/09/17   [provider]  zolpidem (AMBIEN) 10 MG tablet Take 10 mg by mouth at bedtime.    [provider]    Family History Family History  Problem Relation Age of Onset  . Anxiety disorder Mother   . Congestive Heart Failure Mother   . Crohn's disease Mother   . Arthritis Father   . High blood pressure Father     Social History Social History   Tobacco Use  . Smoking status: Former Smoker    Types: Cigarettes  . Smokeless tobacco: Never Used  Substance Use Topics  . Alcohol use: Yes    Comment: daily/ 3-4 beers  . Drug use: No      Allergies   Diphenhydramine hcl; Flexeril [cyclobenzaprine]; and Nsaids   Review of Systems Review of Systems  Constitutional: Negative for appetite change, chills and fever.  HENT: Positive for congestion, ear pain, sinus pressure and sinus pain. Negative for ear discharge, rhinorrhea, sneezing and sore throat.   Eyes: Negative for photophobia and visual disturbance.  Respiratory: Negative for cough, chest tightness, shortness of breath and wheezing.   Cardiovascular: Negative for chest pain and palpitations.  Gastrointestinal: Positive for nausea and vomiting. Negative for abdominal pain, blood in stool, constipation and diarrhea.  Genitourinary: Negative for dysuria, hematuria and urgency.  Musculoskeletal: Negative for myalgias.  Skin: Negative for rash.  Neurological: Positive for dizziness. Negative for weakness,  light-headedness and headaches.     Physical Exam Updated Vital Signs BP (!) 160/98 (BP Location: Left Arm)   Pulse 87   Temp 98.3 F (36.8 C) (Oral)   Resp 18   Ht 5' 11"  (1.803 m)   Wt 87.5 kg (193 lb)   SpO2 98%   BMI 26.92 kg/m   Physical Exam  Constitutional: He is oriented to person, place, and time. He appears well-developed and well-nourished. No distress.  HENT:  Head: Normocephalic and atraumatic.  Right Ear: A middle ear effusion is present.  Left Ear: A middle ear effusion is present.  Nose: Nose normal.  Eyes: Conjunctivae are normal. Pupils are equal, round, and reactive to light. Right eye exhibits no discharge. Left eye exhibits no discharge. No scleral icterus. Right eye exhibits nystagmus (horizontal). Left eye exhibits nystagmus (horizontal).  Neck: Normal range of motion. Neck supple.  Cardiovascular: Normal rate, regular rhythm, normal heart sounds and intact distal pulses. Exam reveals no gallop and no friction rub.  No murmur heard. Pulmonary/Chest: Effort normal and breath sounds normal. No respiratory distress.  Abdominal:  Soft. Bowel sounds are normal. He exhibits no distension. There is no tenderness. There is no guarding.  Musculoskeletal: Normal range of motion. He exhibits no edema.  Neurological: He is alert and oriented to person, place, and time. No cranial nerve deficit or sensory deficit. He exhibits normal muscle tone. Coordination normal.  Pupils reactive. No facial asymmetry noted. Cranial nerves appear grossly intact. Sensation intact to light touch on face, BUE and BLE. Strength 5/5 in BUE and BLE. Normal finger to nose coordination bilaterally.  Skin: Skin is warm and dry. No rash noted.  Psychiatric: He has a normal mood and affect.  Nursing note and vitals reviewed.    ED Treatments / Results  Labs (all labs ordered are listed, but only abnormal results are displayed) Labs Reviewed  BASIC METABOLIC PANEL - Abnormal; Notable for the following components:      Result Value   Sodium 126 (*)    Chloride 86 (*)    BUN <5 (*)    All other components within normal limits  CBC WITH DIFFERENTIAL/PLATELET - Abnormal; Notable for the following components:   WBC 16.5 (*)    Neutro Abs 13.5 (*)    Monocytes Absolute 1.2 (*)    All other components within normal limits  URINALYSIS, ROUTINE W REFLEX MICROSCOPIC - Abnormal; Notable for the following components:   Hgb urine dipstick MODERATE (*)    All other components within normal limits    EKG  EKG Interpretation None       Radiology No results found.  Procedures Procedures (including critical care time)  Medications Ordered in ED Medications  amLODipine (NORVASC) tablet 5 mg (not administered)  ondansetron (ZOFRAN-ODT) disintegrating tablet 4 mg (4 mg Oral Given 05/02/17 1225)  LORazepam (ATIVAN) tablet 0.5 mg (0.5 mg Oral Given 05/02/17 1704)  sodium chloride 0.9 % bolus 1,000 mL (1,000 mLs Intravenous New Bag/Given 05/02/17 1845)     Initial Impression / Assessment and Plan / ED Course  I have reviewed the triage vital signs  and the nursing notes.  Pertinent labs & imaging results that were available during my care of the patient were reviewed by me and considered in my medical decision making (see chart for details).     Patient presents to ED for evaluation of multiple complaints.  He reports dizziness, 3 episodes of emesis that occurred last night.  He reports complete  resolution of his symptoms currently.  He also reports chronic sinus pressure, left ear pain and feels like he is dehydrated.  Patient has seen here previously for similar symptoms.  On physical exam he is overall well-appearing.  He has no focal deficits on neurological exam.  He denies any chest pain, shortness of breath, abdominal pain.  He was tells me multiple times that he "needs something to eat because I do not have much at home."  Patient given a meal here with no emesis noted.  Given home dose of Ativan with improvement in symptoms.  Lab work shows hyponatremia which appears chronic and is actually improved since his last visit approximately 3 days ago.  Remainder of lab work and urinalysis is unremarkable.  Patient given a bag of fluid here in the ED with successful p.o. challenge.  Will discharge with meclizine and advised him to follow-up with primary care for further evaluation.  I suspect that his symptoms are due to vertigo rather than acute intracranial abnormality.  Patient appears stable for discharge at this time.  Strict return precautions given.  Final Clinical Impressions(s) / ED Diagnoses   Final diagnoses:  Vertigo    ED Discharge Orders    None       Delia Heady, PA-C 05/02/17 1857    Charlesetta Shanks, MD 05/06/17 1827

## 2017-05-02 NOTE — ED Triage Notes (Addendum)
Pt states headache and L sided ear pain x 3 days.  Last night began experiencing dizziness (like the room was spinning) and vomiting.  Equal strength in all limbs.  Per [facility], pt ambulated to stretcher without difficulty.  Pt took reglan last night with some relief.  Also c/o abdominal pain.

## 2017-05-08 ENCOUNTER — Emergency Department (HOSPITAL_COMMUNITY)
Admission: EM | Admit: 2017-05-08 | Discharge: 2017-05-08 | Disposition: A | Discharge status: Home / Self Care | Payer: Medicaid Other | Attending: Emergency Medicine | Admitting: Emergency Medicine

## 2017-05-08 ENCOUNTER — Emergency Department (HOSPITAL_COMMUNITY): Payer: Medicaid Other

## 2017-05-08 ENCOUNTER — Other Ambulatory Visit: Payer: Self-pay

## 2017-05-08 ENCOUNTER — Encounter (HOSPITAL_COMMUNITY): Payer: Self-pay | Admitting: Emergency Medicine

## 2017-05-08 DIAGNOSIS — K089 Disorder of teeth and supporting structures, unspecified: Secondary | ICD-10-CM | POA: Insufficient documentation

## 2017-05-08 DIAGNOSIS — I10 Essential (primary) hypertension: Secondary | ICD-10-CM | POA: Diagnosis not present

## 2017-05-08 DIAGNOSIS — F419 Anxiety disorder, unspecified: Secondary | ICD-10-CM | POA: Diagnosis not present

## 2017-05-08 DIAGNOSIS — Z79899 Other long term (current) drug therapy: Secondary | ICD-10-CM | POA: Diagnosis not present

## 2017-05-08 DIAGNOSIS — Z87891 Personal history of nicotine dependence: Secondary | ICD-10-CM | POA: Insufficient documentation

## 2017-05-08 DIAGNOSIS — G4700 Insomnia, unspecified: Secondary | ICD-10-CM | POA: Diagnosis not present

## 2017-05-08 DIAGNOSIS — E871 Hypo-osmolality and hyponatremia: Secondary | ICD-10-CM | POA: Diagnosis not present

## 2017-05-08 LAB — BASIC METABOLIC PANEL
Anion gap: 13 (ref 5–15)
BUN: <5 mg/dL — ABNORMAL LOW (ref 6–20)
CHLORIDE: 90 mmol/L — AB (ref 101–111)
CO2: 23 mmol/L (ref 22–32)
CREATININE: 0.66 mg/dL (ref 0.61–1.24)
Calcium: 9.1 mg/dL (ref 8.9–10.3)
GFR calc non Af Amer: >60 mL/min (ref >60)
Glucose, Bld: 103 mg/dL — ABNORMAL HIGH (ref 65–99)
POTASSIUM: 3.9 mmol/L (ref 3.5–5.1)
Sodium: 126 mmol/L — ABNORMAL LOW (ref 135–145)

## 2017-05-08 MED ORDER — SODIUM CHLORIDE 0.9 % IV BOLUS (SEPSIS)
1000.0000 mL | Freq: Once | INTRAVENOUS | Status: AC
  Administered 2017-05-08: 1000 mL via INTRAVENOUS

## 2017-05-08 MED ORDER — TRAZODONE HCL 50 MG PO TABS
50.0000 mg | ORAL_TABLET | Freq: Every day | ORAL | Status: DC
  Administered 2017-05-08: 50 mg via ORAL
  Filled 2017-05-08: qty 1

## 2017-05-08 MED ORDER — LORAZEPAM 1 MG PO TABS
1.0000 mg | ORAL_TABLET | Freq: Once | ORAL | Status: AC
  Administered 2017-05-08: 1 mg via ORAL
  Filled 2017-05-08: qty 1

## 2017-05-08 NOTE — ED Provider Notes (Signed)
[name] DEPT Provider Note   CSN: [id number] Arrival date & time: 05/08/17  1126     History   Chief Complaint Chief Complaint  Patient presents with  . Anxiety  . Shortness of Breath  . Abdominal Pain    HPI Anthony Lamb is a 61 y.o. male.  Patient with a history of anxiety presents via EMS with restlessness, feeling anxious, unable to sleep. He feels SOB and like his heart is racing, typical of his anxiety. No fever or cough. No vomiting. He reports he has nothing at home for his symptoms because he used his prescriptions prior to being able to refill them. He reports he "had to" because his anxiety has been so bad. He has not been to see his doctor for symptoms.    The history is provided by the patient. No language interpreter was used.    Past Medical History:  Diagnosis Date  . Alcohol abuse, in remission   . Anemia   . Anxiety   . Chronic pain   . Colitis   . Depression   . Esophagitis   . Gastritis   . GERD (gastroesophageal reflux disease)   . Hypertension   . IBS (irritable bowel syndrome)   . Poor dentition     Patient Active Problem List   Diagnosis Date Noted  . Dehydration 12/13/2016  . Alcohol abuse   . Depression   . SOB (shortness of breath)   . Chest pain 11/11/2016  . Anxiety   . Gastroesophageal reflux disease   . Osteopenia determined by x-ray 08/06/2014  . Stress fracture of calcaneus 08/06/2014  . Vitamin D deficiency 12/18/2013  . Dementia 12/18/2013  . Benign microscopic hematuria 07/06/2013  . SIADH (syndrome of inappropriate ADH production) ([place]) 06/22/2013  . Hyponatremia 06/20/2013  . Ankylosing spondylitis ([place]) 06/20/2013  . Malnutrition of moderate degree ([place]) 06/20/2013  . Depression with anxiety 05/31/2013  . BPH (benign prostatic hyperplasia) 08/22/2010  . Backache 04/19/2010  . ALCOHOLISM 08/13/2008  . History of colonic polyps 08/13/2008  . Essential hypertension 07/03/2007    . PUD (peptic ulcer disease) 07/03/2007  . Irritable bowel syndrome 07/03/2007    Past Surgical History:  Procedure Laterality Date  . COLONOSCOPY W/ BIOPSIES    . ESOPHAGOGASTRODUODENOSCOPY    . HERNIA REPAIR Bilateral   . TONSILLECTOMY         Home Medications    Prior to Admission medications   Medication Sig Start Date End Date Taking? Authorizing Provider  acetaminophen (TYLENOL) 500 MG tablet Take 1,000 mg by mouth every 6 (six) hours as needed (teeth pain).   Yes [provider]  Cholecalciferol (VITAMIN D) 2000 units CAPS Take 4,000 Units by mouth daily.    Yes [provider]  ferrous sulfate 325 (65 FE) MG tablet Take 325 mg by mouth daily with breakfast.   Yes [provider]  gabapentin (NEURONTIN) 400 MG capsule take 43m by mouth three times daily as needed for pain 04/24/17  Yes [provider]  lisinopril-hydrochlorothiazide (PRINZIDE,ZESTORETIC) 10-12.5 MG tablet Take 1 tablet by mouth at bedtime.   Yes [provider]  LORazepam (ATIVAN) 0.5 MG tablet Take 1 tablet (0.5 mg total) by mouth at bedtime. Patient taking differently: Take 0.5 mg by mouth 3 (three) times daily.  11/12/16 11/12/17 Yes DArchie Patten MD  meclizine (ANTIVERT) 12.5 MG tablet Take 1 tablet (12.5 mg total) by mouth 3 (three) times daily as needed for dizziness. 05/02/17  Yes Khatri, Hina, PA-C  pantoprazole (PROTONIX) 40 MG tablet Take 1 tablet (40 mg total) by mouth daily. Patient taking differently: Take 40 mg by mouth daily with breakfast.  04/24/14  Yes Janith Lima, MD  sucralfate (CARAFATE) 1 G tablet Take 1 tablet (1 g total) by mouth 4 (four) times daily -  with meals and at bedtime. Chew before swallowing Patient taking differently: Take 1 g by mouth 4 (four) times daily. Chew before swallowing 12/17/13  Yes Janith Lima, MD  verapamil (CALAN-SR) 240 MG CR tablet Take 240 mg by mouth at bedtime. 02/09/17  Yes [provider]   zolpidem (AMBIEN) 10 MG tablet Take 10 mg by mouth at bedtime.   Yes [provider]  amLODipine (NORVASC) 5 MG tablet Take 1 tablet (5 mg total) by mouth daily. Patient not taking: Reported on 02/15/2017 12/14/16 12/14/17  Kathrene Alu, MD  atorvastatin (LIPITOR) 20 MG tablet Take 1 tablet (20 mg total) by mouth daily at 6 PM. Patient not taking: Reported on 02/15/2017 11/12/16   Archie Patten, MD  chlordiazePOXIDE (LIBRIUM) 25 MG capsule 16m PO TID x 1D, then 25-566mPO BID X 1D, then 25-5037mO QD X 1D Patient not taking: Reported on 04/11/2017 03/24/17   Little, RacWenda OverlandD  clonazePAM (KLONOPIN) 0.5 MG tablet Take 0.5 mg 3 (three) times daily as needed by mouth for anxiety or sleep. 03/27/17   [provider]  dicyclomine (BENTYL) 20 MG tablet Take 1 tablet (20 mg total) by mouth 4 (four) times daily -  before meals and at bedtime. Patient not taking: Reported on 03/13/2017 02/26/17 03/08/17  IsaDuffy BruceD  folic acid (FOLVITE) 1 MG tablet Take 1 tablet (1 mg total) by mouth daily. Patient not taking: Reported on 02/15/2017 12/15/16   WinKathrene AluD  hydrOXYzine (ATARAX/VISTARIL) 25 MG tablet Take 1 tablet (25 mg total) by mouth every 8 (eight) hours as needed for anxiety (sleep). Patient not taking: Reported on 05/08/2017 02/26/17   IsaDuffy BruceD  metoCLOPramide (REGLAN) 10 MG tablet Take 1 tablet (10 mg total) by mouth every 6 (six) hours as needed for nausea (nausea/headache). Patient not taking: Reported on 03/13/2017 01/05/17   YaoDrenda FreezeD  sodium chloride 1 g tablet Take 1 tablet (1 g total) by mouth daily. For 2 days Patient not taking: Reported on 03/24/2017 03/13/17   HarMargarita MailA-C  thiamine 100 MG tablet Take 1 tablet (100 mg total) by mouth daily. Patient not taking: Reported on 05/08/2017 12/15/16   WinKathrene AluD    Family History Family History  Problem Relation Age of Onset  . Anxiety disorder Mother   .  Congestive Heart Failure Mother   . Crohn's disease Mother   . Arthritis Father   . High blood pressure Father     Social History Social History   Tobacco Use  . Smoking status: Former Smoker    Types: Cigarettes  . Smokeless tobacco: Never Used  Substance Use Topics  . Alcohol use: Yes    Comment: daily/ 3-4 beers  . Drug use: No     Allergies   Diphenhydramine hcl; Flexeril [cyclobenzaprine]; and Nsaids   Review of Systems Review of Systems  Constitutional: Negative for chills and fever.  Respiratory: Positive for shortness of breath.   Cardiovascular: Positive for palpitations.  Gastrointestinal: Negative.  Negative for vomiting.  Musculoskeletal: Negative.  Negative for myalgias.  Skin: Negative.   Neurological: Negative.  Negative for syncope.  Psychiatric/Behavioral: Positive for sleep disturbance. Negative for hallucinations and suicidal ideas. The patient is nervous/anxious.      Physical Exam Updated Vital Signs BP (!) 162/92 (BP Location: Right Arm)   Pulse 80   Temp 98 F (36.7 C) (Oral)   Resp 16   SpO2 99%   Physical Exam  Constitutional: He is oriented to person, place, and time. He appears well-developed and well-nourished.  HENT:  Head: Normocephalic.  Widespread dental decay  Neck: Normal range of motion. Neck supple.  Cardiovascular: Normal rate and regular rhythm.  Pulmonary/Chest: Effort normal and breath sounds normal. He has no wheezes. He has no rales.  Abdominal: Soft. Bowel sounds are normal. There is no tenderness. There is no rebound and no guarding.  Musculoskeletal: Normal range of motion.  Neurological: He is alert and oriented to person, place, and time.  Skin: Skin is warm and dry. No rash noted.  Psychiatric: He has a normal mood and affect.     ED Treatments / Results  Labs (all labs ordered are listed, but only abnormal results are displayed) Labs Reviewed  BASIC METABOLIC PANEL - Abnormal; Notable for the following  components:      Result Value   Sodium 126 (*)    Chloride 90 (*)    Glucose, Bld 103 (*)    BUN <5 (*)    All other components within normal limits    EKG  EKG Interpretation  Date/Time:  Tuesday May 08 2017 12:05:17 EST Ventricular Rate:  77 PR Interval:    QRS Duration: 108 QT Interval:  409 QTC Calculation: 463 R Axis:   93 Text Interpretation:  Sinus rhythm Low voltage, precordial leads Consider right ventricular hypertrophy No significant change since last tracing Confirmed by Wandra Arthurs [phone]) on 05/08/2017 7:11:56 PM       Radiology Dg Chest 2 View  Result Date: 05/08/2017 CLINICAL DATA:  Epigastric pain.  Short of breath EXAM: CHEST  2 VIEW COMPARISON:  04/01/2017 FINDINGS: Heart size normal. Negative for heart failure. Lungs are clear and unchanged from the prior study. No infiltrate effusion or mass. Ankylosing spondylitis without fracture IMPRESSION: No active cardiopulmonary disease. Electronically Signed   By: Franchot Gallo M.D.   On: 05/08/2017 13:00    Procedures Procedures (including critical care time)  Medications Ordered in ED Medications  sodium chloride 0.9 % bolus 1,000 mL (1,000 mLs Intravenous New Bag/Given 05/08/17 2009)  LORazepam (ATIVAN) tablet 1 mg (1 mg Oral Given 05/08/17 2001)     Initial Impression / Assessment and Plan / ED Course  I have reviewed the triage vital signs and the nursing notes.  Pertinent labs & imaging results that were available during my care of the patient were reviewed by me and considered in my medical decision making (see chart for details).     Patient with a history of anxiety presents with typical symptoms. He has been out of his medications because of overuse. His exam is unremarkable. VSS. He has persistent hyponatremia, improved over recent levels.   He is eating and drinking. He felt dehydrated and was provided fluids. He can be discharged home. He is encouraged to see his doctor for further  management.   Final Clinical Impressions(s) / ED Diagnoses   Final diagnoses:  None   1. Insomnia 2. Anxiety 3. Chronic hyponatremia  ED Discharge Orders    None       Charlann Lange, Hershal Coria 05/08/17 2211  Drenda Freeze, MD 05/08/17 2225

## 2017-05-08 NOTE — ED Triage Notes (Signed)
Per EMS- pt called with c/o Shortness of breath and restlessness and anxiety. Pt was appropriate, ambulatory, denies chest pain. Tx with saline lock, EKG. CBG, O2 2l [place]. Pt remains cooperative, unsure if he wants to be tx.

## 2017-05-10 ENCOUNTER — Encounter (HOSPITAL_COMMUNITY): Payer: Self-pay | Admitting: Emergency Medicine

## 2017-05-10 ENCOUNTER — Emergency Department (HOSPITAL_COMMUNITY)
Admission: EM | Admit: 2017-05-10 | Discharge: 2017-05-11 | Disposition: A | Discharge status: Home / Self Care | Payer: Medicaid Other | Attending: Emergency Medicine | Admitting: Emergency Medicine

## 2017-05-10 DIAGNOSIS — R45851 Suicidal ideations: Secondary | ICD-10-CM | POA: Insufficient documentation

## 2017-05-10 DIAGNOSIS — F329 Major depressive disorder, single episode, unspecified: Secondary | ICD-10-CM

## 2017-05-10 DIAGNOSIS — Z87891 Personal history of nicotine dependence: Secondary | ICD-10-CM | POA: Diagnosis not present

## 2017-05-10 DIAGNOSIS — I10 Essential (primary) hypertension: Secondary | ICD-10-CM | POA: Insufficient documentation

## 2017-05-10 DIAGNOSIS — F4323 Adjustment disorder with mixed anxiety and depressed mood: Secondary | ICD-10-CM | POA: Insufficient documentation

## 2017-05-10 DIAGNOSIS — Z818 Family history of other mental and behavioral disorders: Secondary | ICD-10-CM | POA: Diagnosis not present

## 2017-05-10 LAB — COMPREHENSIVE METABOLIC PANEL
ALBUMIN: 4.4 g/dL (ref 3.5–5.0)
ALT: 21 U/L (ref 17–63)
AST: 28 U/L (ref 15–41)
Alkaline Phosphatase: 103 U/L (ref 38–126)
Anion gap: 13 (ref 5–15)
CHLORIDE: 88 mmol/L — AB (ref 101–111)
CO2: 25 mmol/L (ref 22–32)
CREATININE: 0.68 mg/dL (ref 0.61–1.24)
Calcium: 9.1 mg/dL (ref 8.9–10.3)
GFR calc Af Amer: >60 mL/min (ref >60)
Glucose, Bld: 116 mg/dL — ABNORMAL HIGH (ref 65–99)
POTASSIUM: 3.6 mmol/L (ref 3.5–5.1)
SODIUM: 126 mmol/L — AB (ref 135–145)
Total Bilirubin: 0.9 mg/dL (ref 0.3–1.2)
Total Protein: 8.1 g/dL (ref 6.5–8.1)

## 2017-05-10 LAB — CBC
HCT: 45.3 % (ref 39.0–52.0)
Hemoglobin: 16.2 g/dL (ref 13.0–17.0)
MCH: 31.3 pg (ref 26.0–34.0)
MCHC: 35.8 g/dL (ref 30.0–36.0)
MCV: 87.6 fL (ref 78.0–100.0)
Platelets: 390 10*3/uL (ref 150–400)
RBC: 5.17 MIL/uL (ref 4.22–5.81)
RDW: 12.8 % (ref 11.5–15.5)
WBC: 11.2 10*3/uL — ABNORMAL HIGH (ref 4.0–10.5)

## 2017-05-10 LAB — RAPID URINE DRUG SCREEN, HOSP PERFORMED
Amphetamines: NOT DETECTED
Barbiturates: NOT DETECTED
Benzodiazepines: POSITIVE — AB
Cocaine: NOT DETECTED
Opiates: NOT DETECTED
Tetrahydrocannabinol: NOT DETECTED

## 2017-05-10 LAB — ACETAMINOPHEN LEVEL: Acetaminophen (Tylenol), Serum: <10 ug/mL — ABNORMAL LOW (ref 10–30)

## 2017-05-10 LAB — ETHANOL

## 2017-05-10 LAB — SALICYLATE LEVEL: Salicylate Lvl: <7 mg/dL (ref 2.8–30.0)

## 2017-05-10 MED ORDER — TRAZODONE HCL 50 MG PO TABS
50.0000 mg | ORAL_TABLET | Freq: Every evening | ORAL | Status: DC | PRN
  Administered 2017-05-10 (×2): 50 mg via ORAL
  Filled 2017-05-10 (×2): qty 1

## 2017-05-10 MED ORDER — LORAZEPAM 1 MG PO TABS: 0.0000 mg | ORAL_TABLET | Freq: Two times a day (BID) | ORAL | Status: DC

## 2017-05-10 MED ORDER — ACETAMINOPHEN 325 MG PO TABS
650.0000 mg | ORAL_TABLET | ORAL | Status: DC | PRN
  Administered 2017-05-10: 650 mg via ORAL
  Filled 2017-05-10: qty 2

## 2017-05-10 MED ORDER — PANTOPRAZOLE SODIUM 40 MG PO TBEC
40.0000 mg | DELAYED_RELEASE_TABLET | Freq: Every day | ORAL | Status: DC
  Administered 2017-05-10 – 2017-05-11 (×2): 40 mg via ORAL
  Filled 2017-05-10 (×2): qty 1

## 2017-05-10 MED ORDER — LORAZEPAM 1 MG PO TABS
0.0000 mg | ORAL_TABLET | Freq: Four times a day (QID) | ORAL | Status: DC
  Administered 2017-05-10: 1 mg via ORAL
  Administered 2017-05-11: 2 mg via ORAL
  Filled 2017-05-10 (×2): qty 2

## 2017-05-10 MED ORDER — THIAMINE HCL 100 MG/ML IJ SOLN: 100.0000 mg | Freq: Every day | INTRAMUSCULAR | Status: DC

## 2017-05-10 MED ORDER — GABAPENTIN 400 MG PO CAPS
400.0000 mg | ORAL_CAPSULE | Freq: Three times a day (TID) | ORAL | Status: DC
  Administered 2017-05-10 – 2017-05-11 (×2): 400 mg via ORAL
  Filled 2017-05-10 (×2): qty 1

## 2017-05-10 MED ORDER — VITAMIN B-1 100 MG PO TABS
100.0000 mg | ORAL_TABLET | Freq: Every day | ORAL | Status: DC
  Administered 2017-05-10 – 2017-05-11 (×2): 100 mg via ORAL
  Filled 2017-05-10 (×2): qty 1

## 2017-05-10 MED ORDER — VERAPAMIL HCL ER 240 MG PO TBCR
240.0000 mg | EXTENDED_RELEASE_TABLET | Freq: Every day | ORAL | Status: DC
  Administered 2017-05-10: 240 mg via ORAL
  Filled 2017-05-10: qty 1

## 2017-05-10 MED ORDER — ONDANSETRON HCL 4 MG PO TABS: 4.0000 mg | ORAL_TABLET | Freq: Three times a day (TID) | ORAL | Status: DC | PRN

## 2017-05-10 MED ORDER — ALUM & MAG HYDROXIDE-SIMETH 200-200-20 MG/5ML PO SUSP: 30.0000 mL | Freq: Four times a day (QID) | ORAL | Status: DC | PRN

## 2017-05-10 MED ORDER — LORAZEPAM 2 MG/ML IJ SOLN: 0.0000 mg | Freq: Four times a day (QID) | INTRAMUSCULAR | Status: DC

## 2017-05-10 MED ORDER — SUCRALFATE 1 G PO TABS
1.0000 g | ORAL_TABLET | Freq: Three times a day (TID) | ORAL | Status: DC
  Administered 2017-05-10 – 2017-05-11 (×2): 1 g via ORAL
  Filled 2017-05-10 (×3): qty 1

## 2017-05-10 MED ORDER — LORAZEPAM 2 MG/ML IJ SOLN: 0.0000 mg | Freq: Two times a day (BID) | INTRAMUSCULAR | Status: DC

## 2017-05-10 NOTE — ED Notes (Signed)
Spoke with Mia, PA-C & informed pt is wanting to leave.  Per Mia, TTS has recommended pt stay for overnight observation and if they feel he needs to be IVC'd, they would be the ones to initiate.

## 2017-05-10 NOTE — ED Triage Notes (Addendum)
Per EMS, patient coming from home, c/o headache x2 days. Reports he has had no food, drink, or sleep x1 week. Reports SI w/o plan, stating "I just don't think I can make it through the day." Seen on 12/4 for anxiety.  BP 167/82 HR 77 RR 18 O2 100%  CBG 189  22g L hand 55m Zofran

## 2017-05-10 NOTE — Progress Notes (Addendum)
CSW received consult for patient having no food at home/ no money to buy food- attempting to follow up with patient now. Will provide additional resources for shelters/ food panties/ local free meals.    Kingsley Spittle, [facility] Emergency Room Clinical Social Worker [phone]

## 2017-05-10 NOTE — ED Provider Notes (Signed)
[facility] DEPT Provider Note   CSN: [id number] Arrival date & time: 05/10/17  1124     History   Chief Complaint Chief Complaint  Patient presents with  . Suicidal    HPI Anthony Lamb is a 61 y.o. male with a h/o of alcoholism, PUD, IBS, chronic hyponatremia, and anxiety who presents to the emergency department with a chief complaint of "I cannot go on living like this anymore."  The patient reports that he has not slept in 4 days because he ran out of his prescription of Ambien. 4 days ago.  He also reports that he ran out of his home Ativan 4 days ago.  He states that he has been taking the medication as prescribed, but someone "stole the rest of it."   He also states that he hasn't eaten or had anything to drink in one week because "his food stamps expired, he doesn't have any money, and the guys that does his grocery shopping isn't always available."   He also complains of a global headache that began 2 days ago with associated bilateral blurred vision that has been quotation marks going on for years" and chronic dental pain because "all my teeth are rotten, and I was supposed to get them pulled."   He denies HI or auditory and visual hallucinations. No suicidal plan, previously behavioral health inpatient admissions, or previous suicide attempts.   He reports that he has been compliant with his lisinopril-hydrochlorothiazide, but is out of his pantoprazole as well as his other medications listed above.   He reports he has been unable to get in contact with his social worker Linward Foster.   PCP is Dr. Nancy Fetter.   The history is provided by the patient. No language interpreter was used.    Past Medical History:  Diagnosis Date  . Alcohol abuse, in remission   . Anemia   . Anxiety   . Chronic pain   . Colitis   . Depression   . Esophagitis   . Gastritis   . GERD (gastroesophageal reflux disease)   . Hypertension   . IBS (irritable bowel  syndrome)   . Poor dentition     Patient Active Problem List   Diagnosis Date Noted  . Dehydration 12/13/2016  . Alcohol abuse   . Depression   . SOB (shortness of breath)   . Chest pain 11/11/2016  . Anxiety   . Gastroesophageal reflux disease   . Osteopenia determined by x-ray 08/06/2014  . Stress fracture of calcaneus 08/06/2014  . Vitamin D deficiency 12/18/2013  . Dementia 12/18/2013  . Benign microscopic hematuria 07/06/2013  . 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 Hyponatremia 06/20/2013  . Ankylosing spondylitis ([place]) 06/20/2013  . Malnutrition of moderate degree ([place]) 06/20/2013  . Depression with anxiety 05/31/2013  . BPH (benign prostatic hyperplasia) 08/22/2010  . Backache 04/19/2010  . ALCOHOLISM 08/13/2008  . History of colonic polyps 08/13/2008  . Essential hypertension 07/03/2007  . PUD (peptic ulcer disease) 07/03/2007  . Irritable bowel syndrome 07/03/2007    Past Surgical History:  Procedure Laterality Date  . COLONOSCOPY W/ BIOPSIES    . ESOPHAGOGASTRODUODENOSCOPY    . HERNIA REPAIR Bilateral   . TONSILLECTOMY         Home Medications    Prior to Admission medications   Medication Sig Start Date End Date Taking? Authorizing Provider  acetaminophen (TYLENOL) 500 MG tablet Take 1,000 mg by mouth every 6 (six) hours  as needed (teeth pain).   Yes [provider]  Cholecalciferol (VITAMIN D) 2000 units CAPS Take 4,000 Units by mouth daily.    Yes [provider]  ferrous sulfate 325 (65 FE) MG tablet Take 325 mg by mouth daily with breakfast.   Yes [provider]  gabapentin (NEURONTIN) 400 MG capsule take 494m by mouth three times daily as needed for pain 04/24/17  Yes [provider]  lisinopril-hydrochlorothiazide (PRINZIDE,ZESTORETIC) 10-12.5 MG tablet Take 1 tablet by mouth at bedtime.   Yes [provider]  LORazepam (ATIVAN) 0.5 MG tablet Take 0.5 mg by mouth 3  (three) times daily.   Yes [provider]  Multiple Vitamins-Minerals (MENS ONE DAILY PO) Take 1 tablet by mouth daily.   Yes [provider]  pantoprazole (PROTONIX) 40 MG tablet Take 1 tablet (40 mg total) by mouth daily. Patient taking differently: Take 40 mg by mouth daily with breakfast.  04/24/14  Yes JJanith Lima MD  sucralfate (CARAFATE) 1 G tablet Take 1 tablet (1 g total) by mouth 4 (four) times daily -  with meals and at bedtime. Chew before swallowing Patient taking differently: Take 1 g by mouth 4 (four) times daily. Chew before swallowing 12/17/13  Yes JJanith Lima MD  verapamil (CALAN-SR) 240 MG CR tablet Take 240 mg by mouth at bedtime. 02/09/17  Yes [provider]  zolpidem (AMBIEN) 10 MG tablet Take 10 mg by mouth at bedtime.   Yes [provider]  amLODipine (NORVASC) 5 MG tablet Take 1 tablet (5 mg total) by mouth daily. Patient not taking: Reported on 02/15/2017 12/14/16 12/14/17  WKathrene Alu MD  atorvastatin (LIPITOR) 20 MG tablet Take 1 tablet (20 mg total) by mouth daily at 6 PM. Patient not taking: Reported on 02/15/2017 11/12/16   DArchie Patten MD  chlordiazePOXIDE (LIBRIUM) 25 MG capsule 567mPO TID x 1D, then 25-5051mO BID X 1D, then 25-38m57m QD X 1D Patient not taking: Reported on 04/11/2017 03/24/17   Little, RachWenda Overland  dicyclomine (BENTYL) 20 MG tablet Take 1 tablet (20 mg total) by mouth 4 (four) times daily -  before meals and at bedtime. Patient not taking: Reported on 03/13/2017 02/26/17 03/08/17  IsaaDuffy Bruce  folic acid (FOLVITE) 1 MG tablet Take 1 tablet (1 mg total) by mouth daily. Patient not taking: Reported on 02/15/2017 12/15/16   WinfKathrene Alu  hydrOXYzine (ATARAX/VISTARIL) 25 MG tablet Take 1 tablet (25 mg total) by mouth every 8 (eight) hours as needed for anxiety (sleep). Patient not taking: Reported on 05/08/2017 02/26/17   IsaaDuffy Bruce  LORazepam (ATIVAN) 0.5 MG tablet Take  1 tablet (0.5 mg total) by mouth at bedtime. Patient not taking: Reported on 05/10/2017 11/12/16 11/12/17  DorsArchie Patten  meclizine (ANTIVERT) 12.5 MG tablet Take 1 tablet (12.5 mg total) by mouth 3 (three) times daily as needed for dizziness. Patient not taking: Reported on 05/10/2017 05/02/17   KhatDelia Heady-C  metoCLOPramide (REGLAN) 10 MG tablet Take 1 tablet (10 mg total) by mouth every 6 (six) hours as needed for nausea (nausea/headache). Patient not taking: Reported on 03/13/2017 01/05/17   Yao,Drenda Freeze  sodium chloride 1 g tablet Take 1 tablet (1 g total) by mouth daily. For 2 days Patient not taking: Reported on 03/24/2017 03/13/17   HarrMargarita Mail-C  thiamine 100 MG tablet Take 1 tablet (100 mg total) by mouth daily. Patient not  taking: Reported on 05/08/2017 12/15/16   Kathrene Alu, MD    Family History Family History  Problem Relation Age of Onset  . Anxiety disorder Mother   . Congestive Heart Failure Mother   . Crohn's disease Mother   . Arthritis Father   . High blood pressure Father     Social History Social History   Tobacco Use  . Smoking status: Former Smoker    Types: Cigarettes  . Smokeless tobacco: Never Used  Substance Use Topics  . Alcohol use: Yes    Comment: daily/ 3-4 beers  . Drug use: No     Allergies   Diphenhydramine hcl; Flexeril [cyclobenzaprine]; and Nsaids   Review of Systems Review of Systems  Constitutional: Negative for activity change.  HENT: Positive for dental problem (chronic).   Respiratory: Negative for shortness of breath.   Cardiovascular: Negative for chest pain.  Gastrointestinal: Positive for abdominal pain (chronic). Negative for diarrhea and vomiting.  Musculoskeletal: Negative for back pain and neck pain.  Skin: Negative for rash.  Neurological: Positive for headaches (chronic).  Psychiatric/Behavioral: Positive for dysphoric mood and suicidal ideas. Negative for behavioral problems, confusion  and hallucinations. The patient is nervous/anxious.    Physical Exam Updated Vital Signs BP (!) 157/80 (BP Location: Right Arm)   Pulse 78   Temp 98.2 F (36.8 C) (Oral)   Resp 18   SpO2 98%   Physical Exam  Constitutional: He appears well-developed. No distress.  HENT:  Head: Normocephalic.  Extremely poor dentition. No gross abscesses.   Eyes: Conjunctivae are normal. No scleral icterus.  Neck: Neck supple.  Cardiovascular: Normal rate, regular rhythm, normal heart sounds and intact distal pulses. Exam reveals no gallop and no friction rub.  No murmur heard. Pulmonary/Chest: Effort normal and breath sounds normal. No stridor. No respiratory distress. He has no wheezes. He has no rales. He exhibits no tenderness.  Abdominal: Soft. Bowel sounds are normal. He exhibits no distension and no mass. There is no tenderness. There is no rebound and no guarding. No hernia.  Musculoskeletal: Normal range of motion. He exhibits no edema, tenderness or deformity.  Neurological: He is alert.  Cranial nerves II through XII are grossly intact.  Symmetric tandem gait.  Throughout the bilateral upper and lower extremities.  5 out of 5 strength of the bilateral upper and lower extremities against resistance.  Skin: Skin is warm and dry. Capillary refill takes less than 2 seconds. No rash noted. He is not diaphoretic.  Psychiatric: His behavior is normal.  Nursing note and vitals reviewed.    ED Treatments / Results  Labs (all labs ordered are listed, but only abnormal results are displayed) Labs Reviewed  COMPREHENSIVE METABOLIC PANEL - Abnormal; Notable for the following components:      Result Value   Sodium 126 (*)    Chloride 88 (*)    Glucose, Bld 116 (*)    BUN <5 (*)    All other components within normal limits  ACETAMINOPHEN LEVEL - Abnormal; Notable for the following components:   Acetaminophen (Tylenol), Serum <10 (*)    All other components within normal limits  CBC - Abnormal;  Notable for the following components:   WBC 11.2 (*)    All other components within normal limits  RAPID URINE DRUG SCREEN, HOSP PERFORMED - Abnormal; Notable for the following components:   Benzodiazepines POSITIVE (*)    All other components within normal limits  ETHANOL  SALICYLATE LEVEL    EKG  EKG Interpretation None       Radiology No results found.  Procedures Procedures (including critical care time)  Medications Ordered in ED Medications  gabapentin (NEURONTIN) capsule 400 mg (400 mg Oral Given 05/10/17 2122)  pantoprazole (PROTONIX) EC tablet 40 mg (40 mg Oral Given 05/10/17 1922)  verapamil (CALAN-SR) CR tablet 240 mg (240 mg Oral Given 05/10/17 2121)  sucralfate (CARAFATE) tablet 1 g (1 g Oral Given 05/10/17 2122)  ondansetron (ZOFRAN) tablet 4 mg (not administered)  acetaminophen (TYLENOL) tablet 650 mg (not administered)  LORazepam (ATIVAN) injection 0-4 mg (not administered)    Or  LORazepam (ATIVAN) tablet 0-4 mg (not administered)  LORazepam (ATIVAN) injection 0-4 mg ( Intravenous See Alternative 05/10/17 1921)    Or  LORazepam (ATIVAN) tablet 0-4 mg (1 mg Oral Given 05/10/17 1921)  thiamine (VITAMIN B-1) tablet 100 mg (100 mg Oral Given 05/10/17 1922)    Or  thiamine (B-1) injection 100 mg ( Intravenous See Alternative 05/10/17 1922)  alum & mag hydroxide-simeth (MAALOX/MYLANTA) 200-200-20 MG/5ML suspension 30 mL (not administered)  traZODone (DESYREL) tablet 50 mg (50 mg Oral Given 05/10/17 2122)     Initial Impression / Assessment and Plan / ED Course  I have reviewed the triage vital signs and the nursing notes.  Pertinent labs & imaging results that were available during my care of the patient were reviewed by me and considered in my medical decision making (see chart for details).     61 year old male with a history of anxiety, IBS, PUD, and alcoholism presenting with passive suicidal ideation and decreased sleep x4 days.  No suicidal plan, HI,  auditory or visual hallucinations, or previous suicide attempts or inpatient behavioral stays. The patient lives alone, and his history is concerning for food scarcity.  Consulted social work, and the patient became angry and declined all resources. [place] CSRS indicated the patient received a 30-day supply (90 tablets) of his home Ativan on 2018 and a 30-day supply (90 tablets of Ambien on 04/27/17. He has not taken either medication in 4 days. Based on the half-life of Ativan, and his physical exam, patient is not exhibiting any symptoms of withdrawal.  Na 126 today.  The patient is on hydrochlorothiazide, which he states he is compliant with.  This appears to be a chronic issue and likely secondary to HCTZ use. Pt medically cleared at this time. Psych hold orders and home med orders placed that the patient is actively taking. TTS consult performed and AM psych eval recommended; please see psych team notes for further documentation of care/dispo. Pt stable at time of med clearance.    Final Clinical Impressions(s) / ED Diagnoses   Final diagnoses:  Current episode of major depressive disorder without prior episode, unspecified depression episode severity    ED Discharge Orders    None       Joanne Gavel, PA-C 05/10/17 2250    Julianne Rice, MD 05/16/17 1000

## 2017-05-10 NOTE — Progress Notes (Signed)
CSW met with pt at pt's bedside. CSW attempted to provide pt with free meal and food resources. Pt refused resources. Pt stated he has no way to get there and no where to stay. Pt refused resources for shelters. CSW informed pt if he changes his mind and would like the resource information, CSW can provide the information.   Wendelyn Breslow, [facility]  [phone]

## 2017-05-10 NOTE — ED Notes (Signed)
Patient reports SI w/o a plan. Patient denies HI/AVH at this time. Plan of care discussed. Encouragement and support provided and safety maintain. Q 15 min safety checks in place and video monitoring.

## 2017-05-10 NOTE — ED Notes (Signed)
Bed: [facility] Expected date:  Expected time:  Means of arrival:  Comments: 

## 2017-05-10 NOTE — BH Assessment (Addendum)
Assessment Note  Anthony Lamb is an 61 y.o. male. Pt reports SI with a plan to "cut myself." Pt reports depression and anxiety. Pt states "my nerves are so bad I can't eat." Pt states "I don't think I can go on for much longer," "I'm going to end it all." Pt denies HI. Pt states "I hear stuff when I close my eyes." Pt arrived to the hospital stating he does not have food or drink at his home. Pt was given resources for food and drink by the SW but the Pt refused. The Pt "I don't need the resources because I will not be here." Pt denies previous or current mental treatment. Pt denies current mental health medication.   Margarita Grizzle, NP recommends overnight observation for safety and stabilization.   Diagnosis:  F33.2 MDD  Past Medical History:  Past Medical History:  Diagnosis Date  . Alcohol abuse, in remission   . Anemia   . Anxiety   . Chronic pain   . Colitis   . Depression   . Esophagitis   . Gastritis   . GERD (gastroesophageal reflux disease)   . Hypertension   . IBS (irritable bowel syndrome)   . Poor dentition     Past Surgical History:  Procedure Laterality Date  . COLONOSCOPY W/ BIOPSIES    . ESOPHAGOGASTRODUODENOSCOPY    . HERNIA REPAIR Bilateral   . TONSILLECTOMY      Family History:  Family History  Problem Relation Age of Onset  . Anxiety disorder Mother   . Congestive Heart Failure Mother   . Crohn's disease Mother   . Arthritis Father   . High blood pressure Father     Social History:  reports that he has quit smoking. His smoking use included cigarettes. he has never used smokeless tobacco. He reports that he drinks alcohol. He reports that he does not use drugs.  Additional Social History:  Alcohol / Drug Use Pain Medications: please see mar Prescriptions: please see mar Over the Counter: please see mar History of alcohol / drug use?: No history of alcohol / drug abuse Longest period of sobriety (when/how long): NA  CIWA: CIWA-Ar BP: (!)  169/90 Pulse Rate: 74 COWS:    Allergies:  Allergies  Allergen Reactions  . Diphenhydramine Hcl Other (See Comments)    Restlessness  . Flexeril [Cyclobenzaprine] Other (See Comments)    Restlessness  . Nsaids Other (See Comments)    GI upset    Home Medications:  (Not in a hospital admission)  OB/GYN Status:  No LMP for male patient.  General Assessment Data [facility] of Assessment: [facility] TTS Assessment: In system Is this a Tele or Face-to-Face Assessment?: Face-to-Face Is this an Initial Assessment or a Re-assessment for this encounter?: Initial Assessment Marital status: Single Maiden name: NA Is patient pregnant?: No Pregnancy Status: No Living Arrangements: Alone Can pt return to current living arrangement?: Yes Admission Status: Voluntary Is patient capable of signing voluntary admission?: Yes Referral Source: Self/Family/Friend Insurance type: Medicaid     Crisis Care Plan Living Arrangements: Alone Legal Guardian: Other:(self) Name of Psychiatrist: NA Name of Therapist: NA  Education Status Is patient currently in school?: No Current Grade: NA Highest grade of school patient has completed: 12 Name of school: NA Contact person: NA  Risk to self with the past 6 months Suicidal Ideation: Yes-Currently Present Has patient been a risk to self within the past 6 months prior to admission? : No Suicidal Intent: Yes-Currently Present Has  patient had any suicidal intent within the past 6 months prior to admission? : No Is patient at risk for suicide?: Yes Suicidal Plan?: Yes-Currently Present Has patient had any suicidal plan within the past 6 months prior to admission? : No Specify Current Suicidal Plan: to cut himself Access to Means: No What has been your use of drugs/alcohol within the last 12 months?: Pt denies Previous Attempts/Gestures: No How many times?: 0 Other Self Harm Risks: NA Triggers for Past Attempts: None known Intentional Self  Injurious Behavior: None Family Suicide History: No Recent stressful life event(s): Financial Problems Persecutory voices/beliefs?: No Depression: Yes Depression Symptoms: Despondent, Insomnia, Tearfulness, Fatigue, Isolating, Guilt, Loss of interest in usual pleasures, Feeling worthless/self pity, Feeling angry/irritable Substance abuse history and/or treatment for substance abuse?: No Suicide prevention information given to non-admitted patients: Not applicable  Risk to Others within the past 6 months Homicidal Ideation: No Does patient have any lifetime risk of violence toward others beyond the six months prior to admission? : No Thoughts of Harm to Others: No Current Homicidal Intent: No Current Homicidal Plan: No Access to Homicidal Means: No Identified Victim: NA History of harm to others?: No Assessment of Violence: None Noted Violent Behavior Description: NA Does patient have access to weapons?: No Criminal Charges Pending?: No Does patient have a court date: No Is patient on probation?: No  Psychosis Hallucinations: Visual Delusions: None noted  Mental Status Report Appearance/Hygiene: Disheveled Eye Contact: Fair Motor Activity: Freedom of movement Speech: Logical/coherent Level of Consciousness: Alert Mood: Depressed Affect: Depressed Anxiety Level: None Thought Processes: Coherent, Relevant Judgement: Unimpaired Orientation: Person, Place, Time, Situation Obsessive Compulsive Thoughts/Behaviors: None  Cognitive Functioning Concentration: Normal Memory: Recent Intact, Remote Intact IQ: Average Insight: Fair Impulse Control: Fair Appetite: Fair Weight Loss: 0 Weight Gain: 0 Sleep: Decreased Total Hours of Sleep: 5 Vegetative Symptoms: None  ADLScreening [facility] Assessment Services) Patient's cognitive ability adequate to safely complete daily activities?: Yes Patient able to express need for assistance with ADLs?: Yes Independently performs ADLs?: Yes  (appropriate for developmental age)  Prior Inpatient Therapy Prior Inpatient Therapy: No Prior Therapy Dates: Na Prior Therapy Facilty/Provider(s): NA Reason for Treatment: NA  Prior Outpatient Therapy Prior Outpatient Therapy: No Prior Therapy Dates: NA Prior Therapy Facilty/Provider(s): nA Reason for Treatment: NA Does patient have an ACCT team?: No Does patient have Intensive In-House Services?  : No Does patient have [facility] services? : No Does patient have P4CC services?: No  ADL Screening (condition at time of admission) Patient's cognitive ability adequate to safely complete daily activities?: Yes Is the patient deaf or have difficulty hearing?: No Does the patient have difficulty seeing, even when wearing glasses/contacts?: No Does the patient have difficulty concentrating, remembering, or making decisions?: No Patient able to express need for assistance with ADLs?: Yes Does the patient have difficulty dressing or bathing?: No Independently performs ADLs?: Yes (appropriate for developmental age) Does the patient have difficulty walking or climbing stairs?: Yes       Abuse/Neglect Assessment (Assessment to be complete while patient is alone) Abuse/Neglect Assessment Can Be Completed: Yes Physical Abuse: Denies Verbal Abuse: Denies Sexual Abuse: Denies Exploitation of patient/patient's resources: Denies     Regulatory affairs officer (For Healthcare) Does Patient Have a Medical Advance Directive?: No Would patient like information on creating a medical advance directive?: No - Patient declined    Additional Information 1:1 In Past 12 Months?: No CIRT Risk: No Elopement Risk: No Does patient have medical clearance?: Yes  Disposition:  Disposition Initial Assessment Completed for this Encounter: Yes Disposition of Patient: Re-evaluation by Psychiatry recommended  On Site Evaluation by:   Reviewed with Physician:    Cyndia Bent 05/10/2017 4:36 PM

## 2017-05-10 NOTE — ED Notes (Signed)
Patient changed into paper scrubs and wanded by security.

## 2017-05-11 ENCOUNTER — Encounter (HOSPITAL_COMMUNITY): Payer: Self-pay | Admitting: Emergency Medicine

## 2017-05-11 DIAGNOSIS — Z87891 Personal history of nicotine dependence: Secondary | ICD-10-CM

## 2017-05-11 DIAGNOSIS — F4323 Adjustment disorder with mixed anxiety and depressed mood: Secondary | ICD-10-CM | POA: Diagnosis present

## 2017-05-11 DIAGNOSIS — Z818 Family history of other mental and behavioral disorders: Secondary | ICD-10-CM | POA: Diagnosis not present

## 2017-05-11 MED ORDER — TRAZODONE HCL 100 MG PO TABS: 100.0000 mg | ORAL_TABLET | Freq: Every evening | ORAL | Status: DC | PRN

## 2017-05-11 MED ORDER — TRAZODONE HCL 100 MG PO TABS: 100.0000 mg | ORAL_TABLET | Freq: Every evening | ORAL | 0 refills | Status: DC | PRN

## 2017-05-11 MED ORDER — GABAPENTIN 400 MG PO CAPS: 400.0000 mg | ORAL_CAPSULE | Freq: Three times a day (TID) | ORAL | 0 refills | Status: DC

## 2017-05-11 MED ORDER — HYDROXYZINE HCL 25 MG PO TABS: 25.0000 mg | ORAL_TABLET | Freq: Four times a day (QID) | ORAL | Status: DC | PRN

## 2017-05-11 MED ORDER — LOPERAMIDE HCL 2 MG PO CAPS
4.0000 mg | ORAL_CAPSULE | Freq: Once | ORAL | Status: AC
  Administered 2017-05-11: 4 mg via ORAL
  Filled 2017-05-11: qty 2

## 2017-05-11 MED ORDER — HYDROXYZINE HCL 25 MG PO TABS: 25.0000 mg | ORAL_TABLET | Freq: Four times a day (QID) | ORAL | 0 refills | Status: DC | PRN

## 2017-05-11 NOTE — ED Notes (Signed)
Pt discharged home. Discharged instructions read to pt who verbalized understanding. All belongings returned to pt who signed for same. Denies SI/HI, is not delusional and not responding to internal stimuli. Escorted pt to the ED exit.    

## 2017-05-11 NOTE — Discharge Instructions (Signed)
For your behavioral health needs, you are advised to continue treatment with your primary care provider.

## 2017-05-11 NOTE — BH Assessment (Addendum)
[facility] Assessment Progress Note  Per Buford Dresser, DO, this pt does not require psychiatric hospitalization at this time.  Pt is to be discharged from [facility] with recommendation to continue treatment with his primary care provider.  This has been included in pt's discharge instructions.  Pt would also benefit from seeing Peer Support Specialists; they will be asked to speak to pt.  Pt's nurse, Diane, has been notified.  Jalene Mullet, [place] Triage Specialist [phone]

## 2017-05-11 NOTE — Consult Note (Signed)
[facility] Consult   Reason for Consult:  Suicidal ideations Referring Physician:  EDP Patient Identification: RODDY BELLAMY MRN:  [id number] Principal Diagnosis: Adjustment disorder with mixed anxiety and depressed mood Diagnosis:   Patient Active Problem List   Diagnosis Date Noted  . Adjustment disorder with mixed anxiety and depressed mood [F43.23] 05/11/2017    Priority: High  . Dehydration [E86.0] 12/13/2016  . Alcohol abuse [F10.10]   . SOB (shortness of breath) [R06.02]   . Chest pain [R07.9] 11/11/2016  . Anxiety [F41.9]   . Gastroesophageal reflux disease [K21.9]   . Osteopenia determined by x-ray [M85.80] 08/06/2014  . Stress fracture of calcaneus [N05.397Q] 08/06/2014  . Vitamin D deficiency [E55.9] 12/18/2013  . Dementia [F03.90] 12/18/2013  . Benign microscopic hematuria [R31.1] 07/06/2013  . 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 Hyponatremia [E87.1] 06/20/2013  . Ankylosing spondylitis ([place]) [M45.9] 06/20/2013  . Malnutrition of moderate degree ([place]) [E44.0] 06/20/2013  . BPH (benign prostatic hyperplasia) [N40.0] 08/22/2010  . Backache [M54.9] 04/19/2010  . ALCOHOLISM [F10.20] 08/13/2008  . History of colonic polyps [Z86.010] 08/13/2008  . Essential hypertension [I10] 07/03/2007  . PUD (peptic ulcer disease) [K27.9] 07/03/2007  . Irritable bowel syndrome [K58.9] 07/03/2007    Total Time spent with patient: 45 minutes  Subjective:   SEDDRICK FLAX is a 61 y.o. male patient does not warrant admission.  HPI:  61 yo male who came to the ED with depression and anxiety over a lack of food.  He was having suicidal ideations on admission yesterday but denies those today.  [place] wants to leave as a friend who he missed yesterday is coming today to take him to the grocery store for food.  Denies suicidal/homcidal ideations, hallucinations, and withdrawal symptoms.  No past attempts. He reports he was  drinking about 4 beers a night but can no longer afford, refused Peer Support consultation and believes that his drinking is not a problem.  Stable for discharge.  Past Psychiatric History: anxiety  Risk to Self: None Risk to Others: Homicidal Ideation: No Thoughts of Harm to Others: No Current Homicidal Intent: No Current Homicidal Plan: No Access to Homicidal Means: No Identified Victim: NA History of harm to others?: No Assessment of Violence: None Noted Violent Behavior Description: NA Does patient have access to weapons?: No Criminal Charges Pending?: No Does patient have a court date: No Prior Inpatient Therapy: Prior Inpatient Therapy: No Prior Therapy Dates: Na Prior Therapy Facilty/Provider(s): NA Reason for Treatment: NA Prior Outpatient Therapy: Prior Outpatient Therapy: No Prior Therapy Dates: NA Prior Therapy Facilty/Provider(s): nA Reason for Treatment: NA Does patient have an ACCT team?: No Does patient have Intensive In-House Services?  : No Does patient have [facility] services? : No Does patient have P4[facility] services?: No  Past Medical History:  Past Medical History:  Diagnosis Date  . Alcohol abuse, in remission   . Anemia   . Anxiety   . Chronic pain   . Colitis   . Depression   . Esophagitis   . Gastritis   . GERD (gastroesophageal reflux disease)   . Hypertension   . IBS (irritable bowel syndrome)   . Poor dentition     Past Surgical History:  Procedure Laterality Date  . COLONOSCOPY W/ BIOPSIES    . ESOPHAGOGASTRODUODENOSCOPY    . HERNIA REPAIR Bilateral   . TONSILLECTOMY     Family History:  Family History  Problem Relation Age of Onset  . Anxiety  disorder Mother   . Congestive Heart Failure Mother   . Crohn's disease Mother   . Arthritis Father   . High blood pressure Father    Family Psychiatric  History: none Social History:  Social History   Substance and Sexual Activity  Alcohol Use Yes   Comment: daily/ 3-4 beers     Social  History   Substance and Sexual Activity  Drug Use No    Social History   Socioeconomic History  . Marital status: Single    Spouse name: None  . Number of children: None  . Years of education: None  . Highest education level: None  Social Needs  . Financial resource strain: None  . Food insecurity - worry: None  . Food insecurity - inability: None  . Transportation needs - medical: None  . Transportation needs - non-medical: None  Occupational History  . None  Tobacco Use  . Smoking status: Former Smoker    Types: Cigarettes  . Smokeless tobacco: Never Used  Substance and Sexual Activity  . Alcohol use: Yes    Comment: daily/ 3-4 beers  . Drug use: No  . Sexual activity: No  Other Topics Concern  . None  Social History Narrative   HSG. Long - term disability - unable to work. Lived with his mother in her house - she died 04-20-23 - he is bankrupt/destitute and soon will loose his home. He has not worked enough quarters to be eligible for [employer]. He is advised to seek assistance for health care either with [facility].   Additional Social History: N/A    Allergies:   Allergies  Allergen Reactions  . Diphenhydramine Hcl Other (See Comments)    Restlessness  . Flexeril [Cyclobenzaprine] Other (See Comments)    Restlessness  . Nsaids Other (See Comments)    GI upset    Labs:  Results for orders placed or performed during the hospital encounter of 05/10/17 (from the past 48 hour(s))  Rapid urine drug screen (hospital performed)     Status: Abnormal   Collection Time: 05/10/17 11:36 AM  Result Value Ref Range   Opiates NONE DETECTED NONE DETECTED   Cocaine NONE DETECTED NONE DETECTED   Benzodiazepines POSITIVE (A) NONE DETECTED   Amphetamines NONE DETECTED NONE DETECTED   Tetrahydrocannabinol NONE DETECTED NONE DETECTED   Barbiturates NONE DETECTED NONE DETECTED    Comment:        DRUG SCREEN FOR MEDICAL PURPOSES ONLY.  IF  CONFIRMATION IS NEEDED FOR ANY PURPOSE, NOTIFY LAB WITHIN 5 DAYS.        LOWEST DETECTABLE LIMITS FOR URINE DRUG SCREEN Drug Class       Cutoff (ng/mL) Amphetamine      1000 Barbiturate      200 Benzodiazepine   161 Tricyclics       096 Opiates          300 Cocaine          300 THC              50   Comprehensive metabolic panel     Status: Abnormal   Collection Time: 05/10/17 11:44 AM  Result Value Ref Range   Sodium 126 (L) 135 - 145 mmol/L   Potassium 3.6 3.5 - 5.1 mmol/L   Chloride 88 (L) 101 - 111 mmol/L   CO2 25 22 - 32 mmol/L   Glucose, Bld 116 (H) 65 - 99 mg/dL   BUN <5 (  L) 6 - 20 mg/dL   Creatinine, Ser 0.68 0.61 - 1.24 mg/dL   Calcium 9.1 8.9 - 10.3 mg/dL   Total Protein 8.1 6.5 - 8.1 g/dL   Albumin 4.4 3.5 - 5.0 g/dL   AST 28 15 - 41 U/L   ALT 21 17 - 63 U/L   Alkaline Phosphatase 103 38 - 126 U/L   Total Bilirubin 0.9 0.3 - 1.2 mg/dL   GFR calc non Af Amer >60 >60 mL/min   GFR calc Af Amer >60 >60 mL/min    Comment: (NOTE) The eGFR has been calculated using the CKD EPI equation. This calculation has not been validated in all clinical situations. eGFR's persistently <60 mL/min signify possible Chronic Kidney Disease.    Anion gap 13 5 - 15  Ethanol     Status: None   Collection Time: 05/10/17 11:44 AM  Result Value Ref Range   Alcohol, Ethyl (B) <10 <10 mg/dL    Comment:        LOWEST DETECTABLE LIMIT FOR SERUM ALCOHOL IS 10 mg/dL FOR MEDICAL PURPOSES ONLY   Salicylate level     Status: None   Collection Time: 05/10/17 11:44 AM  Result Value Ref Range   Salicylate Lvl <1.3 2.8 - 30.0 mg/dL  Acetaminophen level     Status: Abnormal   Collection Time: 05/10/17 11:44 AM  Result Value Ref Range   Acetaminophen (Tylenol), Serum <10 (L) 10 - 30 ug/mL    Comment:        THERAPEUTIC CONCENTRATIONS VARY SIGNIFICANTLY. A RANGE OF 10-30 ug/mL MAY BE AN EFFECTIVE CONCENTRATION FOR MANY PATIENTS. HOWEVER, SOME ARE BEST TREATED AT CONCENTRATIONS OUTSIDE  THIS RANGE. ACETAMINOPHEN CONCENTRATIONS >150 ug/mL AT 4 HOURS AFTER INGESTION AND >50 ug/mL AT 12 HOURS AFTER INGESTION ARE OFTEN ASSOCIATED WITH TOXIC REACTIONS.   cbc     Status: Abnormal   Collection Time: 05/10/17 11:44 AM  Result Value Ref Range   WBC 11.2 (H) 4.0 - 10.5 K/uL   RBC 5.17 4.22 - 5.81 MIL/uL   Hemoglobin 16.2 13.0 - 17.0 g/dL   HCT 45.3 39.0 - 52.0 %   MCV 87.6 78.0 - 100.0 fL   MCH 31.3 26.0 - 34.0 pg   MCHC 35.8 30.0 - 36.0 g/dL   RDW 12.8 11.5 - 15.5 %   Platelets 390 150 - 400 K/uL    Current Facility-Administered Medications  Medication Dose Route Frequency Provider Last Rate Last Dose  . acetaminophen (TYLENOL) tablet 650 mg  650 mg Oral Q4H PRN McDonald, Mia A, PA-C   650 mg at 05/10/17 2319  . alum & mag hydroxide-simeth (MAALOX/MYLANTA) 200-200-20 MG/5ML suspension 30 mL  30 mL Oral Q6H PRN McDonald, Mia A, PA-C      . gabapentin (NEURONTIN) capsule 400 mg  400 mg Oral TID McDonald, Mia A, PA-C   400 mg at 05/11/17 0939  . hydrOXYzine (ATARAX/VISTARIL) tablet 25 mg  25 mg Oral Q6H PRN Patrecia Pour, NP      . ondansetron (ZOFRAN) tablet 4 mg  4 mg Oral Q8H PRN McDonald, Mia A, PA-C      . pantoprazole (PROTONIX) EC tablet 40 mg  40 mg Oral Daily McDonald, Mia A, PA-C   40 mg at 05/11/17 0939  . sucralfate (CARAFATE) tablet 1 g  1 g Oral TID AC & HS McDonald, Mia A, PA-C   1 g at 05/11/17 0804  . thiamine (VITAMIN B-1) tablet 100 mg  100 mg Oral Daily  McDonald, Mia A, PA-C   100 mg at 05/11/17 7001   Or  . thiamine (B-1) injection 100 mg  100 mg Intravenous Daily McDonald, Mia A, PA-C      . traZODone (DESYREL) tablet 100 mg  100 mg Oral QHS,MR X 1 Lord, Jamison Y, NP      . verapamil (CALAN-SR) CR tablet 240 mg  240 mg Oral QHS McDonald, Mia A, PA-C   240 mg at 05/10/17 2121   Current Outpatient Medications  Medication Sig Dispense Refill  . acetaminophen (TYLENOL) 500 MG tablet Take 1,000 mg by mouth every 6 (six) hours as needed (teeth pain).     . Cholecalciferol (VITAMIN D) 2000 units CAPS Take 4,000 Units by mouth daily.     . ferrous sulfate 325 (65 FE) MG tablet Take 325 mg by mouth daily with breakfast.    . gabapentin (NEURONTIN) 400 MG capsule take 464m by mouth three times daily as needed for pain  0  . lisinopril-hydrochlorothiazide (PRINZIDE,ZESTORETIC) 10-12.5 MG tablet Take 1 tablet by mouth at bedtime.    .Marland KitchenLORazepam (ATIVAN) 0.5 MG tablet Take 0.5 mg by mouth 3 (three) times daily.    . Multiple Vitamins-Minerals (MENS ONE DAILY PO) Take 1 tablet by mouth daily.    . pantoprazole (PROTONIX) 40 MG tablet Take 1 tablet (40 mg total) by mouth daily. (Patient taking differently: Take 40 mg by mouth daily with breakfast. ) 30 tablet 11  . sucralfate (CARAFATE) 1 G tablet Take 1 tablet (1 g total) by mouth 4 (four) times daily -  with meals and at bedtime. Chew before swallowing (Patient taking differently: Take 1 g by mouth 4 (four) times daily. Chew before swallowing) 120 tablet 5  . verapamil (CALAN-SR) 240 MG CR tablet Take 240 mg by mouth at bedtime.  2  . zolpidem (AMBIEN) 10 MG tablet Take 10 mg by mouth at bedtime.    .Marland KitchenamLODipine (NORVASC) 5 MG tablet Take 1 tablet (5 mg total) by mouth daily. (Patient not taking: Reported on 02/15/2017) 30 tablet 0  . atorvastatin (LIPITOR) 20 MG tablet Take 1 tablet (20 mg total) by mouth daily at 6 PM. (Patient not taking: Reported on 02/15/2017) 30 tablet 0  . chlordiazePOXIDE (LIBRIUM) 25 MG capsule 534mPO TID x 1D, then 25-5049mO BID X 1D, then 25-27m71m QD X 1D (Patient not taking: Reported on 04/11/2017) 10 capsule 0  . dicyclomine (BENTYL) 20 MG tablet Take 1 tablet (20 mg total) by mouth 4 (four) times daily -  before meals and at bedtime. (Patient not taking: Reported on 03/13/2017) 40 tablet 0  . folic acid (FOLVITE) 1 MG tablet Take 1 tablet (1 mg total) by mouth daily. (Patient not taking: Reported on 02/15/2017) 30 tablet 0  . hydrOXYzine (ATARAX/VISTARIL) 25 MG tablet Take 1  tablet (25 mg total) by mouth every 8 (eight) hours as needed for anxiety (sleep). (Patient not taking: Reported on 05/08/2017) 15 tablet 0  . LORazepam (ATIVAN) 0.5 MG tablet Take 1 tablet (0.5 mg total) by mouth at bedtime. (Patient not taking: Reported on 05/10/2017) 10 tablet 0  . meclizine (ANTIVERT) 12.5 MG tablet Take 1 tablet (12.5 mg total) by mouth 3 (three) times daily as needed for dizziness. (Patient not taking: Reported on 05/10/2017) 30 tablet 0  . metoCLOPramide (REGLAN) 10 MG tablet Take 1 tablet (10 mg total) by mouth every 6 (six) hours as needed for nausea (nausea/headache). (Patient not taking: Reported on 03/13/2017) 6  tablet 0  . sodium chloride 1 g tablet Take 1 tablet (1 g total) by mouth daily. For 2 days (Patient not taking: Reported on 03/24/2017) 2 tablet 0  . thiamine 100 MG tablet Take 1 tablet (100 mg total) by mouth daily. (Patient not taking: Reported on 05/08/2017) 30 tablet 0    Musculoskeletal: Strength & Muscle Tone: within normal limits Gait & Station: normal Patient leans: N/A  Psychiatric Specialty Exam: Physical Exam  Constitutional: He is oriented to person, place, and time. He appears well-developed and well-nourished.  HENT:  Head: Normocephalic.  Neck: Normal range of motion.  Respiratory: Effort normal.  Musculoskeletal: Normal range of motion.  Neurological: He is alert and oriented to person, place, and time.  Psychiatric: His speech is normal and behavior is normal. Judgment and thought content normal. His mood appears anxious. Cognition and memory are normal.    Review of Systems  Psychiatric/Behavioral: The patient is nervous/anxious.   All other systems reviewed and are negative.   Blood pressure 121/70, pulse 87, temperature 98.6 F (37 C), temperature source Oral, resp. rate 18, SpO2 98 %.There is no height or weight on file to calculate BMI.  General Appearance: Casual  Eye Contact:  Good  Speech:  Normal Rate  Volume:  Normal   Mood:  Anxiety, mild  Affect:  Congruent  Thought Process:  Coherent and Descriptions of Associations: Intact  Orientation:  Full (Time, Place, and Person)  Thought Content:  WDL and Logical  Suicidal Thoughts:  No  Homicidal Thoughts:  No  Memory:  Immediate;   Good Recent;   Good Remote;   Good  Judgement:  Fair  Insight:  Fair  Psychomotor Activity:  Normal  Concentration:  Concentration: Good and Attention Span: Good  Recall:  Good  Fund of Knowledge:  Good  Language:  Good  Akathisia:  No  Handed:  Right  AIMS (if indicated):   N/A  Assets:  Housing Leisure Time Physical Health Resilience  ADL's:  Intact  Cognition:  WNL  Sleep:   N/A     Treatment Plan Summary: Daily contact with patient to assess and evaluate symptoms and progress in treatment, Medication management and Plan adjustment disorder with mixed depression and anxiety:  -Crisis stabilization -Medication management:  Continued gabapentin 400 mg TID for anxiety and alcohol issues, Vistaril 25 mg every six hours PRN anxiety, and Trazodone 100 mg at bedtime for sleep  -Individual and substance abuse counseling  Disposition: No evidence of imminent risk to self or others at present.    Waylan Boga, NP 05/11/2017 10:13 AM   Patient seen face-to-face for psychiatric evaluation, chart reviewed and case discussed with the physician extender and developed treatment plan. Reviewed the information documented and agree with the treatment plan.  Buford Dresser, DO

## 2017-05-11 NOTE — [facility] Suicide Risk Assessment (Signed)
Suicide Risk Assessment  Discharge Assessment   [facility] Discharge Suicide Risk Assessment   Principal Problem: Adjustment disorder with mixed anxiety and depressed mood Discharge Diagnoses:  Patient Active Problem List   Diagnosis Date Noted  . Adjustment disorder with mixed anxiety and depressed mood [F43.23] 05/11/2017    Priority: High  . Dehydration [E86.0] 12/13/2016  . Alcohol abuse [F10.10]   . SOB (shortness of breath) [R06.02]   . Chest pain [R07.9] 11/11/2016  . Anxiety [F41.9]   . Gastroesophageal reflux disease [K21.9]   . Osteopenia determined by x-ray [M85.80] 08/06/2014  . Stress fracture of calcaneus [T01.601U] 08/06/2014  . Vitamin D deficiency [E55.9] 12/18/2013  . Dementia [F03.90] 12/18/2013  . Benign microscopic hematuria [R31.1] 07/06/2013  . 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 Hyponatremia [E87.1] 06/20/2013  . Ankylosing spondylitis ([place]) [M45.9] 06/20/2013  . Malnutrition of moderate degree ([place]) [E44.0] 06/20/2013  . BPH (benign prostatic hyperplasia) [N40.0] 08/22/2010  . Backache [M54.9] 04/19/2010  . ALCOHOLISM [F10.20] 08/13/2008  . History of colonic polyps [Z86.010] 08/13/2008  . Essential hypertension [I10] 07/03/2007  . PUD (peptic ulcer disease) [K27.9] 07/03/2007  . Irritable bowel syndrome [K58.9] 07/03/2007    Total Time spent with patient: 45 minutes   Musculoskeletal: Strength & Muscle Tone: within normal limits Gait & Station: normal Patient leans: N/A  Psychiatric Specialty Exam: Physical Exam  Constitutional: He is oriented to person, place, and time. He appears well-developed and well-nourished.  HENT:  Head: Normocephalic.  Neck: Normal range of motion.  Respiratory: Effort normal.  Musculoskeletal: Normal range of motion.  Neurological: He is alert and oriented to person, place, and time.  Psychiatric: His speech is normal and behavior is normal. Judgment and thought content normal.  His mood appears anxious. Cognition and memory are normal.    Review of Systems  Psychiatric/Behavioral: The patient is nervous/anxious.   All other systems reviewed and are negative.   Blood pressure 121/70, pulse 87, temperature 98.6 F (37 C), temperature source Oral, resp. rate 18, SpO2 98 %.There is no height or weight on file to calculate BMI.  General Appearance: Casual  Eye Contact:  Good  Speech:  Normal Rate  Volume:  Normal  Mood:  Anxiety, mild  Affect:  Congruent  Thought Process:  Coherent and Descriptions of Associations: Intact  Orientation:  Full (Time, Place, and Person)  Thought Content:  WDL and Logical  Suicidal Thoughts:  No  Homicidal Thoughts:  No  Memory:  Immediate;   Good Recent;   Good Remote;   Good  Judgement:  Fair  Insight:  Fair  Psychomotor Activity:  Normal  Concentration:  Concentration: Good and Attention Span: Good  Recall:  Good  Fund of Knowledge:  Good  Language:  Good  Akathisia:  No  Handed:  Right  AIMS (if indicated):     Assets:  Housing Leisure Time Physical Health Resilience  ADL's:  Intact  Cognition:  WNL  Sleep:      Mental Status Per Nursing Assessment::   On Admission:   depression and anxiety over not having food  Demographic Factors:  Male, Caucasian and Living alone  Loss Factors: NA  Historical Factors: NA  Risk Reduction Factors:   Sense of responsibility to family and Positive social support  Continued Clinical Symptoms:  None  Cognitive Features That Contribute To Risk:  None    Suicide Risk:  Minimal: No identifiable suicidal ideation.  Patients presenting with no risk factors but with  morbid ruminations; may be classified as minimal risk based on the severity of the depressive symptoms    Plan Of Care/Follow-up recommendations:  Activity:  as tolerated Diet:  heart healthy diet  Melodie Ashworth, NP 05/11/2017, 12:40 PM

## 2017-05-13 ENCOUNTER — Emergency Department (HOSPITAL_COMMUNITY)
Admission: EM | Admit: 2017-05-13 | Discharge: 2017-05-13 | Disposition: A | Discharge status: Home / Self Care | Payer: Medicaid Other | Source: Home / Self Care | Attending: Emergency Medicine | Admitting: Emergency Medicine

## 2017-05-13 ENCOUNTER — Encounter (HOSPITAL_COMMUNITY): Payer: Self-pay | Admitting: Emergency Medicine

## 2017-05-13 ENCOUNTER — Emergency Department (HOSPITAL_COMMUNITY)
Admission: EM | Admit: 2017-05-13 | Discharge: 2017-05-13 | Disposition: A | Discharge status: Home / Self Care | Payer: Medicaid Other | Attending: Emergency Medicine | Admitting: Emergency Medicine

## 2017-05-13 ENCOUNTER — Emergency Department (HOSPITAL_COMMUNITY): Payer: Medicaid Other

## 2017-05-13 DIAGNOSIS — F419 Anxiety disorder, unspecified: Secondary | ICD-10-CM | POA: Insufficient documentation

## 2017-05-13 DIAGNOSIS — I10 Essential (primary) hypertension: Secondary | ICD-10-CM | POA: Diagnosis not present

## 2017-05-13 DIAGNOSIS — R0789 Other chest pain: Secondary | ICD-10-CM

## 2017-05-13 DIAGNOSIS — Z79899 Other long term (current) drug therapy: Secondary | ICD-10-CM | POA: Insufficient documentation

## 2017-05-13 DIAGNOSIS — E871 Hypo-osmolality and hyponatremia: Secondary | ICD-10-CM | POA: Insufficient documentation

## 2017-05-13 DIAGNOSIS — Z87891 Personal history of nicotine dependence: Secondary | ICD-10-CM | POA: Diagnosis not present

## 2017-05-13 LAB — CBC
HEMATOCRIT: 44.5 % (ref 39.0–52.0)
HEMOGLOBIN: 15.4 g/dL (ref 13.0–17.0)
MCH: 30.6 pg (ref 26.0–34.0)
MCHC: 34.6 g/dL (ref 30.0–36.0)
MCV: 88.3 fL (ref 78.0–100.0)
Platelets: 344 10*3/uL (ref 150–400)
RBC: 5.04 MIL/uL (ref 4.22–5.81)
RDW: 12.7 % (ref 11.5–15.5)
WBC: 11 10*3/uL — ABNORMAL HIGH (ref 4.0–10.5)

## 2017-05-13 LAB — COMPREHENSIVE METABOLIC PANEL
ALBUMIN: 3.9 g/dL (ref 3.5–5.0)
ALT: 20 U/L (ref 17–63)
AST: 24 U/L (ref 15–41)
Alkaline Phosphatase: 88 U/L (ref 38–126)
Anion gap: 11 (ref 5–15)
BILIRUBIN TOTAL: 0.4 mg/dL (ref 0.3–1.2)
CHLORIDE: 89 mmol/L — AB (ref 101–111)
CO2: 26 mmol/L (ref 22–32)
CREATININE: 0.74 mg/dL (ref 0.61–1.24)
Calcium: 8.9 mg/dL (ref 8.9–10.3)
GFR calc Af Amer: >60 mL/min (ref >60)
Glucose, Bld: 104 mg/dL — ABNORMAL HIGH (ref 65–99)
Potassium: 3.5 mmol/L (ref 3.5–5.1)
Sodium: 126 mmol/L — ABNORMAL LOW (ref 135–145)
TOTAL PROTEIN: 7.3 g/dL (ref 6.5–8.1)

## 2017-05-13 LAB — URINALYSIS, ROUTINE W REFLEX MICROSCOPIC
Bacteria, UA: NONE SEEN
Bilirubin Urine: NEGATIVE
Glucose, UA: NEGATIVE mg/dL
KETONES UR: NEGATIVE mg/dL
Leukocytes, UA: NEGATIVE
Nitrite: NEGATIVE
PH: 7 (ref 5.0–8.0)
Protein, ur: NEGATIVE mg/dL
SQUAMOUS EPITHELIAL / LPF: NONE SEEN
Specific Gravity, Urine: 1.008 (ref 1.005–1.030)

## 2017-05-13 LAB — I-STAT TROPONIN, ED: TROPONIN I, POC: 0 ng/mL (ref 0.00–0.08)

## 2017-05-13 LAB — LIPASE, BLOOD: LIPASE: 25 U/L (ref 11–51)

## 2017-05-13 MED ORDER — HYDROCHLOROTHIAZIDE 12.5 MG PO CAPS
12.5000 mg | ORAL_CAPSULE | Freq: Once | ORAL | Status: AC
  Administered 2017-05-13: 12.5 mg via ORAL
  Filled 2017-05-13: qty 1

## 2017-05-13 MED ORDER — PANTOPRAZOLE SODIUM 40 MG PO TBEC
40.0000 mg | DELAYED_RELEASE_TABLET | Freq: Once | ORAL | Status: AC
  Administered 2017-05-13: 40 mg via ORAL
  Filled 2017-05-13: qty 1

## 2017-05-13 MED ORDER — VERAPAMIL HCL ER 240 MG PO TBCR
240.0000 mg | EXTENDED_RELEASE_TABLET | Freq: Once | ORAL | Status: AC
  Administered 2017-05-13: 240 mg via ORAL
  Filled 2017-05-13: qty 1

## 2017-05-13 MED ORDER — ZOLPIDEM TARTRATE 10 MG PO TABS: 10.0000 mg | ORAL_TABLET | Freq: Every evening | ORAL | 0 refills | Status: DC | PRN

## 2017-05-13 MED ORDER — LORAZEPAM 1 MG PO TABS
1.0000 mg | ORAL_TABLET | Freq: Once | ORAL | Status: AC
  Administered 2017-05-13: 1 mg via ORAL
  Filled 2017-05-13: qty 1

## 2017-05-13 MED ORDER — LISINOPRIL 10 MG PO TABS
10.0000 mg | ORAL_TABLET | Freq: Once | ORAL | Status: AC
  Administered 2017-05-13: 10 mg via ORAL
  Filled 2017-05-13: qty 1

## 2017-05-13 MED ORDER — SODIUM CHLORIDE 0.9 % IV BOLUS (SEPSIS)
1000.0000 mL | Freq: Once | INTRAVENOUS | Status: AC
  Administered 2017-05-13: 1000 mL via INTRAVENOUS

## 2017-05-13 NOTE — ED Triage Notes (Addendum)
Per [facility]: Pt to ED from home c/o intermittent central CP radiating to back onset yesterday afternoon - patient reports he's had this pain before but it became worse today. Pt denies accompanying symptoms. EMS gave 1 NTG without relief. Pt states he can't take ASA d/t gastric ulcers. Patient adds that he's been having trouble sleeping because he's been out of his sleep medication. Patient is A&O x 4. Resp e/u. Skin warm/dry. EMS VS: 170/88, HR 82 strong, regular, CBG 130.

## 2017-05-13 NOTE — ED Notes (Signed)
Patient asked if RN would have PA-C come back in and speak with patient regarding prescription change. Pt bargaining to get different medications - EDP states she cannot change the medication. Patient verbalized understanding of discharge instructions and prescription medication. and denies any further needs or questions at this time. VS stable. Patient ambulatory with steady gait. Ordered patient lunch tray while he sits in the lobby.

## 2017-05-13 NOTE — ED Notes (Signed)
Upon handing patient the Ativan tablet, he attempted to pocket it in his hand while RN was throwing trash away. RN asked him about it and he said he was going to take it later today before going to bed. RN stated that he couldn't do that and if he wanted it, he needed to take it now. Patient took medication.

## 2017-05-13 NOTE — ED Notes (Signed)
EDP spoke with patient regarding Ativan, which patient was requesting. Patient has already received more than his dosage for the day and after speaking with EDP again, pt verbalized understanding. RN attempted to go over his d/c instructions but patient walked out to the lobby. Ambulatory with steady gait.
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
## 2017-05-13 NOTE — Discharge Instructions (Signed)
Please follow up with your primary care provider as soon as possible for any medication management.

## 2017-05-13 NOTE — ED Notes (Signed)
Provided patient another [place] sandwich and sprite.

## 2017-05-13 NOTE — ED Triage Notes (Signed)
Patient states he is here for his home medications. Patient was seen and discharged earlier today, received a prescription for Ambien, but is requesting his other medications as well.

## 2017-05-13 NOTE — ED Provider Notes (Signed)
[facility] EMERGENCY DEPARTMENT Provider Note   CSN: [id number] Arrival date & time: 05/13/17  1928     History   Chief Complaint Chief Complaint  Patient presents with  . Medication Refill    HPI Anthony Lamb is a 61 y.o. male.  HPI   Anthony Lamb is a 61 y.o. male, with a history of alcohol abuse, anemia, GERD, HTN, presenting to the ED with request for doses of his evening medications.  Patient was seen here in the ED earlier today and discharged.  He states he was going to take a cab, but due to the inclement snowy weather outside, no taxi companies are running. States if he is going to have to stay here tonight, he would like to make sure he is able to take his nighttime medications. He is requesting (by name) administration of the following: Lisinopril-hydrochlorothiazide 10-12.5 mg, Protonix 40 mg, verapamil 240 mg CR, and Lorazepam 1 mg.  He states these are consistent with his home medications.  He denies current complaints including chest pain, shortness of breath, nausea/vomiting, fever/chills, dizziness, diaphoresis, or abdominal pain.    Past Medical History:  Diagnosis Date  . Alcohol abuse, in remission   . Anemia   . Anxiety   . Chronic pain   . Colitis   . Depression   . Esophagitis   . Gastritis   . GERD (gastroesophageal reflux disease)   . Hypertension   . IBS (irritable bowel syndrome)   . Poor dentition     Patient Active Problem List   Diagnosis Date Noted  . Adjustment disorder with mixed anxiety and depressed mood 05/11/2017  . Dehydration 12/13/2016  . Alcohol abuse   . SOB (shortness of breath)   . Chest pain 11/11/2016  . Anxiety   . Gastroesophageal reflux disease   . Osteopenia determined by x-ray 08/06/2014  . Stress fracture of calcaneus 08/06/2014  . Vitamin D deficiency 12/18/2013  . Dementia 12/18/2013  . Benign microscopic hematuria 07/06/2013  . SIADH (syndrome of inappropriate ADH production)  ([place]) 06/22/2013  . Hyponatremia 06/20/2013  . Ankylosing spondylitis ([place]) 06/20/2013  . Malnutrition of moderate degree ([place]) 06/20/2013  . BPH (benign prostatic hyperplasia) 08/22/2010  . Backache 04/19/2010  . ALCOHOLISM 08/13/2008  . History of colonic polyps 08/13/2008  . Essential hypertension 07/03/2007  . PUD (peptic ulcer disease) 07/03/2007  . Irritable bowel syndrome 07/03/2007    Past Surgical History:  Procedure Laterality Date  . COLONOSCOPY W/ BIOPSIES    . ESOPHAGOGASTRODUODENOSCOPY    . HERNIA REPAIR Bilateral   . TONSILLECTOMY         Home Medications    Prior to Admission medications   Medication Sig Start Date End Date Taking? Authorizing Provider  LORazepam (ATIVAN) 0.5 MG tablet Take 0.5 mg by mouth 3 (three) times daily as needed for anxiety.   Yes [provider]  acetaminophen (TYLENOL) 500 MG tablet Take 1,000 mg by mouth every 6 (six) hours as needed (teeth pain).    [provider]  amLODipine (NORVASC) 5 MG tablet Take 1 tablet (5 mg total) by mouth daily. Patient not taking: Reported on 02/15/2017 12/14/16 12/14/17  Kathrene Alu, MD  atorvastatin (LIPITOR) 20 MG tablet Take 1 tablet (20 mg total) by mouth daily at 6 PM. Patient not taking: Reported on 02/15/2017 11/12/16   Archie Patten, MD  Cholecalciferol (VITAMIN D) 2000 units CAPS Take 4,000 Units by mouth daily.  [provider]  dicyclomine (BENTYL) 20 MG tablet Take 1 tablet (20 mg total) by mouth 4 (four) times daily -  before meals and at bedtime. Patient not taking: Reported on 03/13/2017 02/26/17 03/08/17  Duffy Bruce, MD  ferrous sulfate 325 (65 FE) MG tablet Take 325 mg by mouth daily with breakfast.    [provider]  folic acid (FOLVITE) 1 MG tablet Take 1 tablet (1 mg total) by mouth daily. Patient not taking: Reported on 02/15/2017 12/15/16   Kathrene Alu, MD  gabapentin (NEURONTIN) 400 MG capsule take 422m by mouth three times  daily as needed for pain 04/24/17   [provider]  gabapentin (NEURONTIN) 400 MG capsule Take 1 capsule (400 mg total) by mouth 3 (three) times daily. 05/11/17   LPatrecia Pour NP  hydrOXYzine (ATARAX/VISTARIL) 25 MG tablet Take 1 tablet (25 mg total) by mouth every 6 (six) hours as needed for anxiety. 05/11/17   LPatrecia Pour NP  lisinopril-hydrochlorothiazide (PRINZIDE,ZESTORETIC) 10-12.5 MG tablet Take 1 tablet by mouth at bedtime.    [provider]  meclizine (ANTIVERT) 12.5 MG tablet Take 1 tablet (12.5 mg total) by mouth 3 (three) times daily as needed for dizziness. Patient not taking: Reported on 05/10/2017 05/02/17   KDelia Heady PA-C  metoCLOPramide (REGLAN) 10 MG tablet Take 1 tablet (10 mg total) by mouth every 6 (six) hours as needed for nausea (nausea/headache). Patient not taking: Reported on 03/13/2017 01/05/17   YDrenda Freeze MD  Multiple Vitamins-Minerals (MENS ONE DAILY PO) Take 1 tablet by mouth daily.    [provider]  pantoprazole (PROTONIX) 40 MG tablet Take 1 tablet (40 mg total) by mouth daily. Patient taking differently: Take 40 mg by mouth daily with breakfast.  04/24/14   JJanith Lima MD  sodium chloride 1 g tablet Take 1 tablet (1 g total) by mouth daily. For 2 days Patient not taking: Reported on 03/24/2017 03/13/17   HMargarita Mail PA-C  sucralfate (CARAFATE) 1 G tablet Take 1 tablet (1 g total) by mouth 4 (four) times daily -  with meals and at bedtime. Chew before swallowing Patient taking differently: Take 1 g by mouth 4 (four) times daily. Chew before swallowing 12/17/13   JJanith Lima MD  thiamine 100 MG tablet Take 1 tablet (100 mg total) by mouth daily. Patient not taking: Reported on 05/08/2017 12/15/16   WKathrene Alu MD  traZODone (DESYREL) 100 MG tablet Take 1 tablet (100 mg total) by mouth at bedtime and may repeat dose one time if needed. 05/11/17   LPatrecia Pour NP  verapamil (CALAN-SR) 240 MG CR tablet  Take 240 mg by mouth at bedtime. 02/09/17   [provider]  zolpidem (AMBIEN) 10 MG tablet Take 1 tablet (10 mg total) by mouth at bedtime as needed for sleep. 05/13/17 06/12/17  KDelia Heady PA-C    Family History Family History  Problem Relation Age of Onset  . Anxiety disorder Mother   . Congestive Heart Failure Mother   . Crohn's disease Mother   . Arthritis Father   . High blood pressure Father     Social History Social History   Tobacco Use  . Smoking status: Former Smoker    Types: Cigarettes  . Smokeless tobacco: Never Used  Substance Use Topics  . Alcohol use: Yes    Comment: daily/ 3-4 beers  . Drug use: No     Allergies   Diphenhydramine hcl; Flexeril [cyclobenzaprine];  and Nsaids   Review of Systems Review of Systems  Constitutional: Negative for chills and fever.  Respiratory: Negative for shortness of breath.   Cardiovascular: Negative for chest pain.  Gastrointestinal: Negative for abdominal pain, diarrhea, nausea and vomiting.  Neurological: Negative for dizziness, weakness and numbness.     Physical Exam Updated Vital Signs BP (!) 163/82 (BP Location: Right Arm)   Pulse 68   Temp 98.3 F (36.8 C) (Oral)   Resp 16   SpO2 96%   Physical Exam  Constitutional: He is oriented to person, place, and time. He appears well-developed and well-nourished. No distress.  HENT:  Head: Normocephalic and atraumatic.  Eyes: Conjunctivae are normal.  Neck: Neck supple.  Cardiovascular: Normal rate, regular rhythm, normal heart sounds and intact distal pulses.  Pulmonary/Chest: Effort normal and breath sounds normal. No respiratory distress.  Abdominal: Soft. There is no tenderness. There is no guarding.  Musculoskeletal: He exhibits no edema.  Lymphadenopathy:    He has no cervical adenopathy.  Neurological: He is alert and oriented to person, place, and time.  Skin: Skin is warm and dry. Capillary refill takes less than 2 seconds. He is not  diaphoretic. No pallor.  Psychiatric: He has a normal mood and affect. His behavior is normal.  Nursing note and vitals reviewed.    ED Treatments / Results  Labs (all labs ordered are listed, but only abnormal results are displayed) Labs Reviewed - No data to display  EKG  EKG Interpretation None       Radiology Dg Chest 2 View  Result Date: 05/13/2017 CLINICAL DATA:  Chest pain EXAM: CHEST  2 VIEW COMPARISON:  May 08, 2017 FINDINGS: There is no edema or consolidation. Heart size and pulmonary vascularity are normal. No adenopathy. There is atherosclerotic calcification in the aorta. There is degenerative change in thoracic spine. IMPRESSION: Aortic atherosclerosis.  No edema or consolidation. Aortic Atherosclerosis (ICD10-I70.0). Electronically Signed   By: Lowella Grip III M.D.   On: 05/13/2017 13:41    Procedures Procedures (including critical care time)  Medications Ordered in ED Medications  lisinopril (PRINIVIL,ZESTRIL) tablet 10 mg (10 mg Oral Given 05/13/17 2053)  hydrochlorothiazide (MICROZIDE) capsule 12.5 mg (12.5 mg Oral Given 05/13/17 2053)  verapamil (CALAN-SR) CR tablet 240 mg (240 mg Oral Given 05/13/17 2132)  pantoprazole (PROTONIX) EC tablet 40 mg (40 mg Oral Given 05/13/17 2053)     Initial Impression / Assessment and Plan / ED Course  I have reviewed the triage vital signs and the nursing notes.  Pertinent labs & imaging results that were available during my care of the patient were reviewed by me and considered in my medical decision making (see chart for details).     Patient presents with request for nighttime dosages of his medications since he will need to probably stay in the waiting room tonight.  Patient's request was thought to be mostly reasonable, however, he also requested a 1 mg dose of lorazepam stating this is his home dose. A search of the [place] database shows patient's home dosage of this medication is 0.29m TID. He was given two 123m doses of lorazepam during his previous ED course earlier today.  Patient was told that he had already exceeded his prescribed daily amount of lorazepam and we would not be administering yet another dose of this today. Follow-up with PCP as needed.  Return precautions discussed.  Patient voiced understanding.    04/27/2017 1  04/24/2017 Zolpidem Tartrate 10 MG Tablet 30  [place] [phone]) 0 0.50 LME Medicaid [place] 04/24/2017 1  04/24/2017 Lorazepam 0.5 MG Tablet 90 30 Yu Sun [id number] [facility] [phone]) 0 1.50 LME Medicaid [place] 03/30/2017 1  03/27/2017 Zolpidem Tartrate 10 MG Tablet 30 30 Yu Sun [id number] Wal [phone]) 0 0.50 LME Medicaid [place] 03/27/2017 1  03/27/2017 Clonazepam 0.5 MG Tablet 90 30 Yu Sun [id number] Wal [phone]) 0 3.00 LME Medicaid [place] 03/24/2017 1  03/24/2017 Chlordiazepoxide 25 MG Capsule 10 3 Ra Lit [id number] Wal [phone]) 0 3.33 LME Medicaid [place] 03/12/2017 1  03/01/2017 Zolpidem Tartrate 10 MG Tablet 20 20 Yu Sun [id number] Wal [phone]) 0 0.50 LME Medicaid [place] 03/01/2017 1  03/01/2017 Lorazepam 0.5 MG Tablet 90 30 Yu Sun [id number] Wal [phone]) 0 1.50 LME Medicaid [place]  Final Clinical Impressions(s) / ED Diagnoses   Final diagnoses:  Medication management    ED Discharge Orders    None       Layla Maw 05/13/17 2135    Daleen Bo, MD 05/13/17 2236

## 2017-05-13 NOTE — ED Notes (Signed)
Patient transported to x-ray. ?

## 2017-05-13 NOTE — Discharge Instructions (Signed)
Continue your home medications as previously prescribed. Follow-up with your primary care provider for further evaluation. Return to ED for worsening symptoms.

## 2017-05-13 NOTE — ED Notes (Signed)
RN offered patient something to eat/drink per PO challenge order. Patient states as he's sitting up in the bed, "If I keep going through these problems and can't sleep then I'm going to commit suicide." RN asks patient to explain further because during initial screening upon arrival to ED, patient stated that he wanted to live and denied thoughts of wanting to hurt himself. Patient then stated, "Yeah but I was just saying that to start with." EDP aware. Pt sitting up in bed, eating without difficulty. NAD noted.

## 2017-05-13 NOTE — ED Notes (Signed)
PA-C at bedside 

## 2017-05-28 ENCOUNTER — Emergency Department (HOSPITAL_COMMUNITY)
Admission: EM | Admit: 2017-05-28 | Discharge: 2017-05-28 | Disposition: A | Discharge status: Home / Self Care | Payer: Medicaid Other | Attending: Emergency Medicine | Admitting: Emergency Medicine

## 2017-05-28 ENCOUNTER — Other Ambulatory Visit: Payer: Self-pay

## 2017-05-28 ENCOUNTER — Encounter (HOSPITAL_COMMUNITY): Payer: Self-pay | Admitting: Neurology

## 2017-05-28 DIAGNOSIS — I10 Essential (primary) hypertension: Secondary | ICD-10-CM | POA: Insufficient documentation

## 2017-05-28 DIAGNOSIS — Z79899 Other long term (current) drug therapy: Secondary | ICD-10-CM | POA: Insufficient documentation

## 2017-05-28 DIAGNOSIS — J0100 Acute maxillary sinusitis, unspecified: Secondary | ICD-10-CM

## 2017-05-28 DIAGNOSIS — E871 Hypo-osmolality and hyponatremia: Secondary | ICD-10-CM | POA: Insufficient documentation

## 2017-05-28 DIAGNOSIS — Z87891 Personal history of nicotine dependence: Secondary | ICD-10-CM | POA: Insufficient documentation

## 2017-05-28 DIAGNOSIS — R1011 Right upper quadrant pain: Secondary | ICD-10-CM | POA: Diagnosis present

## 2017-05-28 LAB — CBC
HCT: 42.2 % (ref 39.0–52.0)
Hemoglobin: 14.8 g/dL (ref 13.0–17.0)
MCH: 30.5 pg (ref 26.0–34.0)
MCHC: 35.1 g/dL (ref 30.0–36.0)
MCV: 86.8 fL (ref 78.0–100.0)
Platelets: 402 10*3/uL — ABNORMAL HIGH (ref 150–400)
RBC: 4.86 MIL/uL (ref 4.22–5.81)
RDW: 12.9 % (ref 11.5–15.5)
WBC: 13.3 10*3/uL — AB (ref 4.0–10.5)

## 2017-05-28 LAB — COMPREHENSIVE METABOLIC PANEL
ALBUMIN: 3.8 g/dL (ref 3.5–5.0)
ALK PHOS: 85 U/L (ref 38–126)
ALT: 19 U/L (ref 17–63)
ANION GAP: 11 (ref 5–15)
AST: 24 U/L (ref 15–41)
BILIRUBIN TOTAL: 0.8 mg/dL (ref 0.3–1.2)
CALCIUM: 8.6 mg/dL — AB (ref 8.9–10.3)
CO2: 24 mmol/L (ref 22–32)
Chloride: 89 mmol/L — ABNORMAL LOW (ref 101–111)
Creatinine, Ser: 0.75 mg/dL (ref 0.61–1.24)
GFR calc Af Amer: >60 mL/min (ref >60)
GLUCOSE: 97 mg/dL (ref 65–99)
Potassium: 3.4 mmol/L — ABNORMAL LOW (ref 3.5–5.1)
Sodium: 124 mmol/L — ABNORMAL LOW (ref 135–145)
TOTAL PROTEIN: 6.9 g/dL (ref 6.5–8.1)

## 2017-05-28 LAB — URINALYSIS, ROUTINE W REFLEX MICROSCOPIC
BILIRUBIN URINE: NEGATIVE
Bacteria, UA: NONE SEEN
GLUCOSE, UA: NEGATIVE mg/dL
KETONES UR: NEGATIVE mg/dL
LEUKOCYTES UA: NEGATIVE
NITRITE: NEGATIVE
PH: 6 (ref 5.0–8.0)
Protein, ur: NEGATIVE mg/dL
SPECIFIC GRAVITY, URINE: 1.009 (ref 1.005–1.030)
Squamous Epithelial / LPF: NONE SEEN

## 2017-05-28 LAB — LIPASE, BLOOD: Lipase: 22 U/L (ref 11–51)

## 2017-05-28 MED ORDER — AMOXICILLIN 500 MG PO CAPS: 500.0000 mg | ORAL_CAPSULE | Freq: Two times a day (BID) | ORAL | 0 refills | Status: AC

## 2017-05-28 MED ORDER — SODIUM CHLORIDE 0.9 % IV BOLUS (SEPSIS)
1000.0000 mL | Freq: Once | INTRAVENOUS | Status: AC
  Administered 2017-05-28: 1000 mL via INTRAVENOUS

## 2017-05-28 MED ORDER — GI COCKTAIL ~~LOC~~
30.0000 mL | Freq: Once | ORAL | Status: AC
  Administered 2017-05-28: 30 mL via ORAL
  Filled 2017-05-28: qty 30

## 2017-05-28 NOTE — ED Notes (Signed)
Pt stating that he wants to leave. Pt being asked to stay, but pt stating that he "cannot stay". Pt decided to stay around for a little longer.

## 2017-05-28 NOTE — ED Notes (Signed)
Pt has low sodium level, charge RN aware, Jeneen Rinks, MD aware of sodium level

## 2017-05-28 NOTE — ED Provider Notes (Signed)
[facility] EMERGENCY DEPARTMENT Provider Note   CSN: [id number] Arrival date & time: 05/28/17  1350     History   Chief Complaint Chief Complaint  Patient presents with  . Diarrhea  . Dehydration    HPI Anthony Lamb is a 61 y.o. male.  HPI  61 y.o. male with a hx of HTN, IBS, Anemia, presents to the Emergency Department today due to dehydration. Notes he has not been eating or drinking due to lack of appetite. Notes intermittent abdominal pain. States in RUQ/LUQ. Rates pain 2/10 currently. Intermittent. Pressure sensation. States that for a while he didn't have any food, but recently acquired food stamps and has been eating. Able to tolerate PO. No N/V. No CP/SOB. No fevers. No cough/congestion. No other symptoms noted   Past Medical History:  Diagnosis Date  . Alcohol abuse, in remission   . Anemia   . Anxiety   . Chronic pain   . Colitis   . Depression   . Esophagitis   . Gastritis   . GERD (gastroesophageal reflux disease)   . Hypertension   . IBS (irritable bowel syndrome)   . Poor dentition     Patient Active Problem List   Diagnosis Date Noted  . Adjustment disorder with mixed anxiety and depressed mood 05/11/2017  . Dehydration 12/13/2016  . Alcohol abuse   . SOB (shortness of breath)   . Chest pain 11/11/2016  . Anxiety   . Gastroesophageal reflux disease   . Osteopenia determined by x-ray 08/06/2014  . Stress fracture of calcaneus 08/06/2014  . Vitamin D deficiency 12/18/2013  . Dementia 12/18/2013  . Benign microscopic hematuria 07/06/2013  . SIADH (syndrome of inappropriate ADH production) ([place]) 06/22/2013  . Hyponatremia 06/20/2013  . Ankylosing spondylitis ([place]) 06/20/2013  . Malnutrition of moderate degree ([place]) 06/20/2013  . BPH (benign prostatic hyperplasia) 08/22/2010  . Backache 04/19/2010  . ALCOHOLISM 08/13/2008  . History of colonic polyps 08/13/2008  . Essential hypertension 07/03/2007  . PUD (peptic ulcer  disease) 07/03/2007  . Irritable bowel syndrome 07/03/2007    Past Surgical History:  Procedure Laterality Date  . COLONOSCOPY W/ BIOPSIES    . ESOPHAGOGASTRODUODENOSCOPY    . HERNIA REPAIR Bilateral   . TONSILLECTOMY         Home Medications    Prior to Admission medications   Medication Sig Start Date End Date Taking? Authorizing Provider  acetaminophen (TYLENOL) 500 MG tablet Take 1,000 mg by mouth every 6 (six) hours as needed (teeth pain).    [provider]  amLODipine (NORVASC) 5 MG tablet Take 1 tablet (5 mg total) by mouth daily. Patient not taking: Reported on 02/15/2017 12/14/16 12/14/17  Kathrene Alu, MD  atorvastatin (LIPITOR) 20 MG tablet Take 1 tablet (20 mg total) by mouth daily at 6 PM. Patient not taking: Reported on 02/15/2017 11/12/16   Archie Patten, MD  Cholecalciferol (VITAMIN D) 2000 units CAPS Take 4,000 Units by mouth daily.     [provider]  dicyclomine (BENTYL) 20 MG tablet Take 1 tablet (20 mg total) by mouth 4 (four) times daily -  before meals and at bedtime. Patient not taking: Reported on 03/13/2017 02/26/17 03/08/17  Duffy Bruce, MD  ferrous sulfate 325 (65 FE) MG tablet Take 325 mg by mouth daily with breakfast.    [provider]  folic acid (FOLVITE) 1 MG tablet Take 1 tablet (1 mg total) by mouth daily. Patient not taking: Reported on 02/15/2017  12/15/16   Kathrene Alu, MD  gabapentin (NEURONTIN) 400 MG capsule take 414m by mouth three times daily as needed for pain 04/24/17   [provider]  gabapentin (NEURONTIN) 400 MG capsule Take 1 capsule (400 mg total) by mouth 3 (three) times daily. 05/11/17   LPatrecia Pour NP  hydrOXYzine (ATARAX/VISTARIL) 25 MG tablet Take 1 tablet (25 mg total) by mouth every 6 (six) hours as needed for anxiety. 05/11/17   LPatrecia Pour NP  lisinopril-hydrochlorothiazide (PRINZIDE,ZESTORETIC) 10-12.5 MG tablet Take 1 tablet by mouth at bedtime.    [provider]  LORazepam (ATIVAN) 0.5 MG tablet Take 0.5 mg by mouth 3 (three) times daily as needed for anxiety.    [provider]  meclizine (ANTIVERT) 12.5 MG tablet Take 1 tablet (12.5 mg total) by mouth 3 (three) times daily as needed for dizziness. Patient not taking: Reported on 05/10/2017 05/02/17   KDelia Heady PA-C  metoCLOPramide (REGLAN) 10 MG tablet Take 1 tablet (10 mg total) by mouth every 6 (six) hours as needed for nausea (nausea/headache). Patient not taking: Reported on 03/13/2017 01/05/17   YDrenda Freeze MD  Multiple Vitamins-Minerals (MENS ONE DAILY PO) Take 1 tablet by mouth daily.    [provider]  pantoprazole (PROTONIX) 40 MG tablet Take 1 tablet (40 mg total) by mouth daily. Patient taking differently: Take 40 mg by mouth daily with breakfast.  04/24/14   JJanith Lima MD  sodium chloride 1 g tablet Take 1 tablet (1 g total) by mouth daily. For 2 days Patient not taking: Reported on 03/24/2017 03/13/17   HMargarita Mail PA-C  sucralfate (CARAFATE) 1 G tablet Take 1 tablet (1 g total) by mouth 4 (four) times daily -  with meals and at bedtime. Chew before swallowing Patient taking differently: Take 1 g by mouth 4 (four) times daily. Chew before swallowing 12/17/13   JJanith Lima MD  thiamine 100 MG tablet Take 1 tablet (100 mg total) by mouth daily. Patient not taking: Reported on 05/08/2017 12/15/16   WKathrene Alu MD  traZODone (DESYREL) 100 MG tablet Take 1 tablet (100 mg total) by mouth at bedtime and may repeat dose one time if needed. 05/11/17   LPatrecia Pour NP  verapamil (CALAN-SR) 240 MG CR tablet Take 240 mg by mouth at bedtime. 02/09/17   [provider]  zolpidem (AMBIEN) 10 MG tablet Take 1 tablet (10 mg total) by mouth at bedtime as needed for sleep. 05/13/17 06/12/17  KDelia Heady PA-C    Family History Family History  Problem Relation Age of Onset  . Anxiety disorder Mother   . Congestive Heart Failure Mother     . Crohn's disease Mother   . Arthritis Father   . High blood pressure Father     Social History Social History   Tobacco Use  . Smoking status: Former Smoker    Types: Cigarettes  . Smokeless tobacco: Never Used  Substance Use Topics  . Alcohol use: Yes    Comment: daily/ 3-4 beers  . Drug use: No     Allergies   Diphenhydramine hcl; Flexeril [cyclobenzaprine]; and Nsaids   Review of Systems Review of Systems ROS reviewed and all are negative for acute change except as noted in the HPI.  Physical Exam Updated Vital Signs BP (!) 154/86 (BP Location: Left Arm)   Pulse 89   Temp 98.8 F (37.1 C) (Oral)   Resp 16   Ht 5'  11" (1.803 m)   Wt 86.2 kg (190 lb)   SpO2 98%   BMI 26.50 kg/m   Physical Exam  Constitutional: He is oriented to person, place, and time. He appears well-developed and well-nourished. No distress.  HENT:  Head: Normocephalic and atraumatic.  Right Ear: Tympanic membrane, external ear and ear canal normal.  Left Ear: Tympanic membrane, external ear and ear canal normal.  Nose: Nose normal.  Mouth/Throat: Uvula is midline, oropharynx is clear and moist and mucous membranes are normal. No trismus in the jaw. No oropharyngeal exudate, posterior oropharyngeal erythema or tonsillar abscesses.  Eyes: EOM are normal. Pupils are equal, round, and reactive to light.  Neck: Normal range of motion. Neck supple. No tracheal deviation present.  Cardiovascular: Normal rate, regular rhythm, S1 normal, S2 normal, normal heart sounds, intact distal pulses and normal pulses.  Pulmonary/Chest: Effort normal and breath sounds normal. No respiratory distress. He has no decreased breath sounds. He has no wheezes. He has no rhonchi. He has no rales.  Abdominal: Normal appearance and bowel sounds are normal. There is no tenderness. There is no rigidity, no rebound, no guarding, no CVA tenderness, no tenderness at McBurney's point and negative Murphy's sign.  Abdomen soft   Musculoskeletal: Normal range of motion.  Neurological: He is alert and oriented to person, place, and time.  Skin: Skin is warm and dry.  Psychiatric: He has a normal mood and affect. His speech is normal and behavior is normal. Thought content normal.  Nursing note and vitals reviewed.  ED Treatments / Results  Labs (all labs ordered are listed, but only abnormal results are displayed) Labs Reviewed  COMPREHENSIVE METABOLIC PANEL - Abnormal; Notable for the following components:      Result Value   Sodium 124 (*)    Potassium 3.4 (*)    Chloride 89 (*)    BUN <5 (*)    Calcium 8.6 (*)    All other components within normal limits  CBC - Abnormal; Notable for the following components:   WBC 13.3 (*)    Platelets 402 (*)    All other components within normal limits  URINALYSIS, ROUTINE W REFLEX MICROSCOPIC - Abnormal; Notable for the following components:   Hgb urine dipstick MODERATE (*)    All other components within normal limits  LIPASE, BLOOD    EKG  EKG Interpretation None       Radiology No results found.  Procedures Procedures (including critical care time)  Medications Ordered in ED Medications  gi cocktail (Maalox,Lidocaine,Donnatal) (30 mLs Oral Given 05/28/17 2122)  sodium chloride 0.9 % bolus 1,000 mL (1,000 mLs Intravenous New Bag/Given 05/28/17 2119)     Initial Impression / Assessment and Plan / ED Course  I have reviewed the triage vital signs and the nursing notes.  Pertinent labs & imaging results that were available during my care of the patient were reviewed by me and considered in my medical decision making (see chart for details).  Final Clinical Impressions(s) / ED Diagnoses  {I have reviewed and evaluated the relevant laboratory values.   {I have reviewed the relevant previous healthcare records.  {I obtained HPI from historian.   ED Course:  Assessment: Pt is a 61 y.o. male with a hx of HTN, IBS, Anemia, presents to the Emergency  Department today due to dehydration. Notes he has not been eating or drinking due to lack of appetite. Notes intermittent abdominal pain. States in RUQ/LUQ. Rates pain 2/10 currently. Intermittent. Pressure sensation.  States that for a while he didn't have any food, but recently acquired food stamps and has been eating. Able to tolerate PO. No N/V. No CP/SOB. No fevers. No cough/congestion. On exam, pt in NAD. Nontoxic/nonseptic appearing. VSS. Afebrile. Lungs CTA. Heart RRR. Abdomen nontender soft. CBC with mild leukocytosis. CMP with Sodium 124. Potassium 3.4. CXR unremarkable. Given NS bolus in ED as well as GI cocktail with improvement. Does note some sinus infection with pressure in maxillary sinuses. Given rx amoxicillin. Plan is to DC home with follow up to PCP. At time of discharge, Patient is in no acute distress. Vital Signs are stable. Patient is able to ambulate. Patient able to tolerate PO.   Disposition/Plan:  DC Home Additional Verbal discharge instructions given and discussed with patient.  Pt Instructed to f/u with PCP in the next week for evaluation and treatment of symptoms. Return precautions given Pt acknowledges and agrees with plan  Supervising Physician Drenda Freeze, MD  Final diagnoses:  Acute non-recurrent maxillary sinusitis  Hyponatremia    ED Discharge Orders    None       Shary Decamp, PA-C 05/28/17 2211    Drenda Freeze, MD 05/31/17 [phone]

## 2017-05-28 NOTE — Discharge Instructions (Signed)
Please read and follow all provided instructions.  Your diagnoses today include:  1. Acute non-recurrent maxillary sinusitis   2. Hyponatremia     Tests performed today include: Vital signs. See below for your results today.   Medications prescribed:  Take as prescribed   Home care instructions:  Follow any educational materials contained in this packet.  Follow-up instructions: Please follow-up with your primary care provider for further evaluation of symptoms and treatment   Return instructions:  Please return to the Emergency Department if you do not get better, if you get worse, or new symptoms OR  - Fever (temperature greater than 101.67F)  - Bleeding that does not stop with holding pressure to the area    -Severe pain (please note that you may be more sore the day after your accident)  - Chest Pain  - Difficulty breathing  - Severe nausea or vomiting  - Inability to tolerate food and liquids  - Passing out  - Skin becoming red around your wounds  - Change in mental status (confusion or lethargy)  - New numbness or weakness    Please return if you have any other emergent concerns.  Additional Information:  Your vital signs today were: BP (!) 145/89 (BP Location: Right Arm)    Pulse 89    Temp 98.8 F (37.1 C) (Oral)    Resp 18    Ht 5' 11"  (1.803 m)    Wt 86.2 kg (190 lb)    SpO2 100%    BMI 26.50 kg/m  If your blood pressure (BP) was elevated above 135/85 this visit, please have this repeated by your doctor within one month. ---------------

## 2017-05-28 NOTE — ED Triage Notes (Addendum)
Pt reports 2 weeks n/d, thinks he is dehydrated. Is eating and drinking but has no appetite. Has colitis, IBS. Has had some intermittent abd pain. For awhile he didn't have any food, but he does now after he got food stamps. Also thinks he has sinusitis.

## 2017-06-11 ENCOUNTER — Emergency Department (HOSPITAL_COMMUNITY): Payer: Medicaid Other

## 2017-06-11 ENCOUNTER — Encounter (HOSPITAL_COMMUNITY): Payer: Self-pay

## 2017-06-11 ENCOUNTER — Emergency Department (HOSPITAL_COMMUNITY)
Admission: EM | Admit: 2017-06-11 | Discharge: 2017-06-11 | Disposition: A | Discharge status: Home / Self Care | Payer: Medicaid Other | Attending: Emergency Medicine | Admitting: Emergency Medicine

## 2017-06-11 ENCOUNTER — Other Ambulatory Visit: Payer: Self-pay

## 2017-06-11 DIAGNOSIS — I10 Essential (primary) hypertension: Secondary | ICD-10-CM | POA: Diagnosis not present

## 2017-06-11 DIAGNOSIS — F419 Anxiety disorder, unspecified: Secondary | ICD-10-CM | POA: Diagnosis not present

## 2017-06-11 DIAGNOSIS — R1013 Epigastric pain: Secondary | ICD-10-CM | POA: Diagnosis present

## 2017-06-11 DIAGNOSIS — Z79899 Other long term (current) drug therapy: Secondary | ICD-10-CM | POA: Diagnosis not present

## 2017-06-11 DIAGNOSIS — Z87891 Personal history of nicotine dependence: Secondary | ICD-10-CM | POA: Diagnosis not present

## 2017-06-11 DIAGNOSIS — F0390 Unspecified dementia without behavioral disturbance: Secondary | ICD-10-CM | POA: Diagnosis not present

## 2017-06-11 DIAGNOSIS — R197 Diarrhea, unspecified: Secondary | ICD-10-CM

## 2017-06-11 LAB — COMPREHENSIVE METABOLIC PANEL
ALBUMIN: 3.7 g/dL (ref 3.5–5.0)
ALK PHOS: 80 U/L (ref 38–126)
ALT: 18 U/L (ref 17–63)
AST: 22 U/L (ref 15–41)
Anion gap: 12 (ref 5–15)
BILIRUBIN TOTAL: 0.8 mg/dL (ref 0.3–1.2)
CO2: 26 mmol/L (ref 22–32)
Calcium: 8.9 mg/dL (ref 8.9–10.3)
Chloride: 91 mmol/L — ABNORMAL LOW (ref 101–111)
Creatinine, Ser: 0.73 mg/dL (ref 0.61–1.24)
GFR calc Af Amer: >60 mL/min (ref >60)
GFR calc non Af Amer: >60 mL/min (ref >60)
GLUCOSE: 103 mg/dL — AB (ref 65–99)
POTASSIUM: 3.9 mmol/L (ref 3.5–5.1)
Sodium: 129 mmol/L — ABNORMAL LOW (ref 135–145)
TOTAL PROTEIN: 7 g/dL (ref 6.5–8.1)

## 2017-06-11 LAB — URINALYSIS, ROUTINE W REFLEX MICROSCOPIC
BACTERIA UA: NONE SEEN
Bilirubin Urine: NEGATIVE
Glucose, UA: NEGATIVE mg/dL
Ketones, ur: NEGATIVE mg/dL
Leukocytes, UA: NEGATIVE
NITRITE: NEGATIVE
PH: 7 (ref 5.0–8.0)
Protein, ur: NEGATIVE mg/dL
SPECIFIC GRAVITY, URINE: 1.006 (ref 1.005–1.030)
SQUAMOUS EPITHELIAL / LPF: NONE SEEN

## 2017-06-11 LAB — CBC
HCT: 44.3 % (ref 39.0–52.0)
Hemoglobin: 14.8 g/dL (ref 13.0–17.0)
MCH: 30.5 pg (ref 26.0–34.0)
MCHC: 33.4 g/dL (ref 30.0–36.0)
MCV: 91.2 fL (ref 78.0–100.0)
Platelets: 318 10*3/uL (ref 150–400)
RBC: 4.86 MIL/uL (ref 4.22–5.81)
RDW: 13.9 % (ref 11.5–15.5)
WBC: 11.4 10*3/uL — ABNORMAL HIGH (ref 4.0–10.5)

## 2017-06-11 LAB — LIPASE, BLOOD: Lipase: 24 U/L (ref 11–51)

## 2017-06-11 MED ORDER — ONDANSETRON 4 MG PO TBDP
4.0000 mg | ORAL_TABLET | Freq: Once | ORAL | Status: AC | PRN
  Administered 2017-06-11: 4 mg via ORAL
  Filled 2017-06-11: qty 1

## 2017-06-11 MED ORDER — SODIUM CHLORIDE 0.9 % IV BOLUS (SEPSIS)
1000.0000 mL | Freq: Once | INTRAVENOUS | Status: AC
  Administered 2017-06-11: 1000 mL via INTRAVENOUS

## 2017-06-11 MED ORDER — HYDROXYZINE HCL 25 MG PO TABS: 25.0000 mg | ORAL_TABLET | Freq: Four times a day (QID) | ORAL | 0 refills | Status: DC | PRN

## 2017-06-11 MED ORDER — LORAZEPAM 0.5 MG PO TABS
0.5000 mg | ORAL_TABLET | Freq: Once | ORAL | Status: AC
Start: 2017-06-11 — End: 2017-06-11
  Administered 2017-06-11: 0.5 mg via ORAL
  Filled 2017-06-11: qty 1

## 2017-06-11 MED ORDER — IOPAMIDOL (ISOVUE-300) INJECTION 61%
INTRAVENOUS | Status: AC
  Administered 2017-06-11: 100 mL via INTRAVENOUS
  Filled 2017-06-11: qty 100

## 2017-06-11 NOTE — Discharge Instructions (Signed)
Please read instructions below. Schedule appointment with your primary care provider to follow-up on your visit today.  You can take Vistaril every 6 hours as needed for anxiety.  You can ask your primary care provider to provide you with refills for lorazepam and Ambien. Take your pantoprazole and Carafate as prescribed for stomach upset.  Avoid alcohol as this can contribute to your symptoms.

## 2017-06-11 NOTE — ED Triage Notes (Addendum)
Pt. Reports having diarrhea and abdominal paain for 4 days.  Pt. Reports having a hx , of colitis and IBS.   Pt. Arrived via [facility],  Pt . Ambulated without any difficulty.  Pt. Reports throwing away his Ambien and Lorazepam Pt. Is very anxious. .  Pt. Is alert and oriented X 4.  Skin is p/w/d

## 2017-06-11 NOTE — ED Provider Notes (Signed)
[name] EMERGENCY DEPARTMENT Provider Note   CSN: [id number] Arrival date & time: 06/11/17  1052     History   Chief Complaint Chief Complaint  Patient presents with  . Diarrhea  . Abdominal Pain    HPI Anthony Lamb is a 62 y.o. male with past medical history of peptic ulcer disease, IBS, hypertension, anemia, anxiety, alcohol abuse, presenting to the ED with 4 days of epigastric and bilateral lower quadrant abdominal pain.  Patient states pain in the epigastric area feels like his ulcers, described as achy. States the pain down lower in his abdomen feels sharp and more like colitis that he has had in the past.  He reports associated diarrhea, with an episode of bloody diarrhea yesterday.  He has had constant nausea without vomiting.  Also states "I think I am dehydrated." Took his pantoprazole and Carafate without relief of epigastric pain. Denies F/C, changes in appetite, urinary sx, or other complaints. Reports drink about 2 beers yesterday evening. Also reports anxiety, as he has not taken his lorazepam today.   The history is provided by the patient.    Past Medical History:  Diagnosis Date  . Alcohol abuse, in remission   . Anemia   . Anxiety   . Chronic pain   . Colitis   . Depression   . Esophagitis   . Gastritis   . GERD (gastroesophageal reflux disease)   . Hypertension   . IBS (irritable bowel syndrome)   . Poor dentition     Patient Active Problem List   Diagnosis Date Noted  . Adjustment disorder with mixed anxiety and depressed mood 05/11/2017  . Dehydration 12/13/2016  . Alcohol abuse   . SOB (shortness of breath)   . Chest pain 11/11/2016  . Anxiety   . Gastroesophageal reflux disease   . Osteopenia determined by x-ray 08/06/2014  . Stress fracture of calcaneus 08/06/2014  . Vitamin D deficiency 12/18/2013  . Dementia 12/18/2013  . Benign microscopic hematuria 07/06/2013  . SIADH (syndrome of inappropriate ADH production)  ([place]) 06/22/2013  . Hyponatremia 06/20/2013  . Ankylosing spondylitis ([place]) 06/20/2013  . Malnutrition of moderate degree ([place]) 06/20/2013  . BPH (benign prostatic hyperplasia) 08/22/2010  . Backache 04/19/2010  . ALCOHOLISM 08/13/2008  . History of colonic polyps 08/13/2008  . Essential hypertension 07/03/2007  . PUD (peptic ulcer disease) 07/03/2007  . Irritable bowel syndrome 07/03/2007    Past Surgical History:  Procedure Laterality Date  . COLONOSCOPY W/ BIOPSIES    . ESOPHAGOGASTRODUODENOSCOPY    . HERNIA REPAIR Bilateral   . TONSILLECTOMY         Home Medications    Prior to Admission medications   Medication Sig Start Date End Date Taking? Authorizing Provider  acetaminophen (TYLENOL) 500 MG tablet Take 1,000 mg by mouth every 6 (six) hours as needed (teeth pain).    [provider]  amLODipine (NORVASC) 5 MG tablet Take 1 tablet (5 mg total) by mouth daily. Patient not taking: Reported on 02/15/2017 12/14/16 12/14/17  Kathrene Alu, MD  atorvastatin (LIPITOR) 20 MG tablet Take 1 tablet (20 mg total) by mouth daily at 6 PM. Patient not taking: Reported on 02/15/2017 11/12/16   Archie Patten, MD  Cholecalciferol (VITAMIN D) 2000 units CAPS Take 4,000 Units by mouth daily.     [provider]  dicyclomine (BENTYL) 20 MG tablet Take 1 tablet (20 mg total) by mouth 4 (four) times daily -  before meals and  at bedtime. Patient not taking: Reported on 03/13/2017 02/26/17 03/08/17  Duffy Bruce, MD  ferrous sulfate 325 (65 FE) MG tablet Take 325 mg by mouth daily with breakfast.    [provider]  folic acid (FOLVITE) 1 MG tablet Take 1 tablet (1 mg total) by mouth daily. Patient not taking: Reported on 02/15/2017 12/15/16   Kathrene Alu, MD  gabapentin (NEURONTIN) 400 MG capsule Take 1 capsule (400 mg total) by mouth 3 (three) times daily. 05/11/17   Patrecia Pour, NP  hydrOXYzine (ATARAX/VISTARIL) 25 MG tablet Take 1 tablet (25 mg total) by  mouth every 6 (six) hours as needed for anxiety. 06/11/17   Robinson, [place] N, PA-C  lisinopril-hydrochlorothiazide (PRINZIDE,ZESTORETIC) 10-12.5 MG tablet Take 1 tablet by mouth at bedtime.    [provider]  LORazepam (ATIVAN) 0.5 MG tablet Take 0.5 mg by mouth 3 (three) times daily as needed for anxiety.    [provider]  meclizine (ANTIVERT) 12.5 MG tablet Take 1 tablet (12.5 mg total) by mouth 3 (three) times daily as needed for dizziness. Patient not taking: Reported on 05/10/2017 05/02/17   Delia Heady, PA-C  metoCLOPramide (REGLAN) 10 MG tablet Take 1 tablet (10 mg total) by mouth every 6 (six) hours as needed for nausea (nausea/headache). Patient not taking: Reported on 03/13/2017 01/05/17   Drenda Freeze, MD  Multiple Vitamins-Minerals (MENS ONE DAILY PO) Take 1 tablet by mouth daily.    [provider]  pantoprazole (PROTONIX) 40 MG tablet Take 1 tablet (40 mg total) by mouth daily. Patient taking differently: Take 40 mg by mouth daily with breakfast.  04/24/14   Janith Lima, MD  sodium chloride 1 g tablet Take 1 tablet (1 g total) by mouth daily. For 2 days Patient not taking: Reported on 03/24/2017 03/13/17   Margarita Mail, PA-C  sucralfate (CARAFATE) 1 G tablet Take 1 tablet (1 g total) by mouth 4 (four) times daily -  with meals and at bedtime. Chew before swallowing Patient taking differently: Take 1 g by mouth 4 (four) times daily. Chew before swallowing 12/17/13   Janith Lima, MD  thiamine 100 MG tablet Take 1 tablet (100 mg total) by mouth daily. Patient not taking: Reported on 05/08/2017 12/15/16   Kathrene Alu, MD  traZODone (DESYREL) 100 MG tablet Take 1 tablet (100 mg total) by mouth at bedtime and may repeat dose one time if needed. Patient not taking: Reported on 05/28/2017 05/11/17   Patrecia Pour, NP  verapamil (CALAN-SR) 240 MG CR tablet Take 240 mg by mouth at bedtime. 02/09/17   [provider]  zolpidem (AMBIEN) 10 MG  tablet Take 1 tablet (10 mg total) by mouth at bedtime as needed for sleep. 05/13/17 06/12/17  Delia Heady, PA-C    Family History Family History  Problem Relation Age of Onset  . Anxiety disorder Mother   . Congestive Heart Failure Mother   . Crohn's disease Mother   . Arthritis Father   . High blood pressure Father     Social History Social History   Tobacco Use  . Smoking status: Former Smoker    Types: Cigarettes  . Smokeless tobacco: Never Used  Substance Use Topics  . Alcohol use: Yes    Comment: daily/ 3-4 beers  . Drug use: No     Allergies   Diphenhydramine hcl; Flexeril [cyclobenzaprine]; and Nsaids   Review of Systems Review of Systems  Constitutional: Negative for appetite change and fever.  Gastrointestinal: Positive for abdominal pain, blood in stool, diarrhea and nausea. Negative for vomiting.  Genitourinary: Negative for dysuria and frequency.  All other systems reviewed and are negative.    Physical Exam Updated Vital Signs BP (!) 170/88 (BP Location: Right Arm)   Pulse 89   Temp 99.1 F (37.3 C) (Oral)   Resp 17   Ht 5' 11"  (1.803 m)   Wt 86.2 kg (190 lb)   SpO2 96%   BMI 26.50 kg/m   Physical Exam  Constitutional: He appears well-developed and well-nourished. He does not appear ill. No distress.  HENT:  Head: Normocephalic and atraumatic.  Mouth/Throat: Oropharynx is clear and moist.  Eyes: Conjunctivae are normal.  Cardiovascular: Normal rate, regular rhythm, normal heart sounds and intact distal pulses.  Pulmonary/Chest: Effort normal and breath sounds normal.  Abdominal: Soft. Normal appearance and bowel sounds are normal. He exhibits no distension and no mass. There is tenderness in the right lower quadrant, epigastric area and left lower quadrant.  Neurological: He is alert.  Skin: Skin is warm.  Psychiatric: He has a normal mood and affect. His behavior is normal.  Nursing note and vitals reviewed.    ED Treatments / Results    Labs (all labs ordered are listed, but only abnormal results are displayed) Labs Reviewed  COMPREHENSIVE METABOLIC PANEL - Abnormal; Notable for the following components:      Result Value   Sodium 129 (*)    Chloride 91 (*)    Glucose, Bld 103 (*)    BUN <5 (*)    All other components within normal limits  CBC - Abnormal; Notable for the following components:   WBC 11.4 (*)    All other components within normal limits  URINALYSIS, ROUTINE W REFLEX MICROSCOPIC - Abnormal; Notable for the following components:   Hgb urine dipstick MODERATE (*)    All other components within normal limits  LIPASE, BLOOD    EKG  EKG Interpretation None       Radiology Ct Abdomen Pelvis W Contrast  Result Date: 06/11/2017 CLINICAL DATA:  Diarrhea and abdominal pain for 4 days. EXAM: CT ABDOMEN AND PELVIS WITH CONTRAST TECHNIQUE: Multidetector CT imaging of the abdomen and pelvis was performed using the standard protocol following bolus administration of intravenous contrast. CONTRAST:  141m ISOVUE-300 IOPAMIDOL (ISOVUE-300) INJECTION 61% COMPARISON:  02/26/2017 FINDINGS: Lower chest: The included heart is normal in size with coronary arteriosclerosis along the LAD. No pericardial effusion. Dependent atelectasis is seen at lung bases. Subsegmental atelectasis is noted in the lingula. Hepatobiliary: Mild hepatic steatosis without enhancing mass. No cholelithiasis or secondary signs of acute cholecystitis. No biliary dilatation. Pancreas: Normal Spleen: Normal Adrenals/Urinary Tract: Stable mild nodular thickening of the left adrenal gland. Symmetric enhancement of both kidneys without obstructive uropathy. No hydroureteronephrosis. The urinary bladder is unremarkable. Symmetric pyelograms on repeat delayed imaging through both kidneys. Stomach/Bowel: The stomach is nondistended in appearance. There is normal small bowel rotation without obstruction or inflammation. Normal caliber appendix.  Vascular/Lymphatic: Aortoiliac atherosclerosis without aneurysm. No lymphadenopathy by CT size criteria. Reproductive: Normal size prostate with coarse central zone calcifications. Normal seminal vesicles. Other: No intraperitoneal free fluid or free air. Musculoskeletal: Bridging lumbar syndesmotic fights and ankylosed sacroiliac joints. Findings are with ankylosing spondylitis. No acute osseous abnormality. IMPRESSION: 1. Mild hepatic steatosis. 2. No acute bowel inflammation or obstruction. 3. Findings of ankylosing spondylitis. 4. Aortoiliac atherosclerosis without aneurysm. Electronically Signed   By: DAshley RoyaltyM.D.   On: 06/11/2017 18:05  Procedures Procedures (including critical care time)  Medications Ordered in ED Medications  ondansetron (ZOFRAN-ODT) disintegrating tablet 4 mg (4 mg Oral Given 06/11/17 1114)  sodium chloride 0.9 % bolus 1,000 mL (0 mLs Intravenous Stopped 06/11/17 1903)  LORazepam (ATIVAN) tablet 0.5 mg (0.5 mg Oral Given 06/11/17 1705)  iopamidol (ISOVUE-300) 61 % injection (100 mLs Intravenous Contrast Given 06/11/17 1721)     Initial Impression / Assessment and Plan / ED Course  I have reviewed the triage vital signs and the nursing notes.  Pertinent labs & imaging results that were available during my care of the patient were reviewed by me and considered in my medical decision making (see chart for details).     Patient presenting with abdominal pain and diarrhea; history of PUD and colitis.  Patient is well-appearing, not in distress.  Abdomen with bilateral lower abdominal tenderness and epigastric tenderness, no peritoneal signs.  CMP showing hyponatremia w sodium of 129, however this appears to be patient's baseline.  White count 11.4, which also appears to be patient's baseline.  Remainder of CMP, CBC, lipase, UA unremarkable.  CT abdomen/pelvis is negative for acute colitis, obstruction, perforation, or any other acute abdominal pathology.  Symptoms managed in  the ED with Zofran.  IV fluids given.  Discussed alcohol use as a trigger for PUD. Pt also requesting refill of lorazepam and ambien, reporting he accidentally threw them away with other empty pills bottles. Offered vistaril rx for anxiety and discussed that pt needs to follow up with PCP regarding rx for ativan and ambien. Pt eating a sandwich prior to discharge. Return precautions discussed, safe for discharge.  Discussed results, findings, treatment and follow up. Patient advised of return precautions. Patient verbalized understanding and agreed with plan.  Final Clinical Impressions(s) / ED Diagnoses   Final diagnoses:  Epigastric abdominal pain  Diarrhea, unspecified type  Anxiety    ED Discharge Orders        Ordered    hydrOXYzine (ATARAX/VISTARIL) 25 MG tablet  Every 6 hours PRN     06/11/17 1856       Robinson, [place] N, PA-C 06/11/17 1954    Sherwood Gambler, MD 06/11/17 2007

## 2017-06-11 NOTE — ED Notes (Signed)
Patient transported to CT 

## 2017-07-31 ENCOUNTER — Encounter (HOSPITAL_COMMUNITY): Payer: Self-pay | Admitting: Emergency Medicine

## 2017-07-31 ENCOUNTER — Emergency Department (HOSPITAL_COMMUNITY)
Admission: EM | Admit: 2017-07-31 | Discharge: 2017-07-31 | Disposition: A | Discharge status: Home / Self Care | Payer: Medicaid Other | Attending: Emergency Medicine | Admitting: Emergency Medicine

## 2017-07-31 ENCOUNTER — Emergency Department (HOSPITAL_COMMUNITY): Payer: Medicaid Other

## 2017-07-31 DIAGNOSIS — Z79899 Other long term (current) drug therapy: Secondary | ICD-10-CM | POA: Diagnosis not present

## 2017-07-31 DIAGNOSIS — R079 Chest pain, unspecified: Secondary | ICD-10-CM | POA: Diagnosis not present

## 2017-07-31 DIAGNOSIS — F419 Anxiety disorder, unspecified: Secondary | ICD-10-CM | POA: Diagnosis not present

## 2017-07-31 DIAGNOSIS — F329 Major depressive disorder, single episode, unspecified: Secondary | ICD-10-CM | POA: Insufficient documentation

## 2017-07-31 DIAGNOSIS — K0889 Other specified disorders of teeth and supporting structures: Secondary | ICD-10-CM | POA: Diagnosis not present

## 2017-07-31 DIAGNOSIS — F0390 Unspecified dementia without behavioral disturbance: Secondary | ICD-10-CM | POA: Insufficient documentation

## 2017-07-31 DIAGNOSIS — Z87891 Personal history of nicotine dependence: Secondary | ICD-10-CM | POA: Diagnosis not present

## 2017-07-31 DIAGNOSIS — I10 Essential (primary) hypertension: Secondary | ICD-10-CM | POA: Insufficient documentation

## 2017-07-31 LAB — CBC
HCT: 45.9 % (ref 39.0–52.0)
HEMOGLOBIN: 15.7 g/dL (ref 13.0–17.0)
MCH: 32 pg (ref 26.0–34.0)
MCHC: 34.2 g/dL (ref 30.0–36.0)
MCV: 93.7 fL (ref 78.0–100.0)
PLATELETS: 397 10*3/uL (ref 150–400)
RBC: 4.9 MIL/uL (ref 4.22–5.81)
RDW: 13.7 % (ref 11.5–15.5)
WBC: 10 10*3/uL (ref 4.0–10.5)

## 2017-07-31 LAB — BASIC METABOLIC PANEL
ANION GAP: 13 (ref 5–15)
BUN: <5 mg/dL — ABNORMAL LOW (ref 6–20)
CALCIUM: 9 mg/dL (ref 8.9–10.3)
CO2: 25 mmol/L (ref 22–32)
Chloride: 93 mmol/L — ABNORMAL LOW (ref 101–111)
Creatinine, Ser: 0.77 mg/dL (ref 0.61–1.24)
GFR calc non Af Amer: >60 mL/min (ref >60)
Glucose, Bld: 108 mg/dL — ABNORMAL HIGH (ref 65–99)
Potassium: 3.9 mmol/L (ref 3.5–5.1)
SODIUM: 131 mmol/L — AB (ref 135–145)

## 2017-07-31 LAB — I-STAT TROPONIN, ED
TROPONIN I, POC: 0 ng/mL (ref 0.00–0.08)
Troponin i, poc: 0 ng/mL (ref 0.00–0.08)

## 2017-07-31 MED ORDER — KETOROLAC TROMETHAMINE 30 MG/ML IJ SOLN
15.0000 mg | Freq: Once | INTRAMUSCULAR | Status: AC
  Administered 2017-07-31: 15 mg via INTRAVENOUS
  Filled 2017-07-31: qty 1

## 2017-07-31 MED ORDER — LORAZEPAM 2 MG/ML IJ SOLN
1.0000 mg | Freq: Once | INTRAMUSCULAR | Status: AC
  Administered 2017-07-31: 1 mg via INTRAVENOUS
  Filled 2017-07-31: qty 1

## 2017-07-31 MED ORDER — SODIUM CHLORIDE 0.9 % IV BOLUS (SEPSIS)
1000.0000 mL | Freq: Once | INTRAVENOUS | Status: AC
  Administered 2017-07-31: 1000 mL via INTRAVENOUS

## 2017-07-31 MED ORDER — AMOXICILLIN-POT CLAVULANATE 875-125 MG PO TABS: 1.0000 | ORAL_TABLET | Freq: Two times a day (BID) | ORAL | 0 refills | Status: DC

## 2017-07-31 NOTE — ED Triage Notes (Signed)
Coming from home; cough, chest pain, decreased appetite. Refused aspirin due to ulcers. EMS gave zofran and it helped. Chest pain comes and goes left sided radiating to neck and head. Currently chest pain free.

## 2017-07-31 NOTE — Discharge Instructions (Signed)
Medications: Augmentin  Treatment: Take Augmentin twice daily until completed.  Follow-up: Please follow-up with your doctor by the end of the week for recheck and further management of your anxiety..  Please follow-up with the oral surgeon as planned for further management of your dental problems.  Please return to the emergency department if you develop any new or worsening symptoms.

## 2017-07-31 NOTE — ED Notes (Signed)
Pt asking about wait time and for food. Pt's vitals will be reassessed.

## 2017-07-31 NOTE — ED Notes (Signed)
Pt asking for something to eat and drink. Pt informed that he needs to see an ED provider first and that this Tech is unable to give him anything at this time.

## 2017-07-31 NOTE — ED Provider Notes (Signed)
[facility]ER[facility] DEPARTMENT Provider Note   CSN: [id number] Arrival date & time: 07/31/17  7680     History   Chief Complaint Chief Complaint  Patient presents with  . Chest Pain    HPI Anthony Lamb is a 62 y.o. male with history of hypertension, anxiety, anemia, GERD, colitis who presents with a one-week history of intermittent chest pain.  Patient reports his pain is sharp and left-sided.  It does not radiate.  It lasts for a few seconds to a minute and resolves.  Patient reports he has been stressed recently with finances.  He has had decreased appetite, however also has not been able to afford to eat very regularly this past week.  Patient reports he feels dehydrated.  He has had associated sore throat for the past few days, but no other symptoms.  He denies any fevers, cough, shortness of breath, abdominal pain, nausea, vomiting.  Patient reports having chest pain in the past related to anxiety.  He does not remember feeling exactly like this, however states it could be related.  He is not currently on any medication for his anxiety.  HPI  Past Medical History:  Diagnosis Date  . Alcohol abuse, in remission   . Anemia   . Anxiety   . Chronic pain   . Colitis   . Depression   . Esophagitis   . Gastritis   . GERD (gastroesophageal reflux disease)   . Hypertension   . IBS (irritable bowel syndrome)   . Poor dentition     Patient Active Problem List   Diagnosis Date Noted  . Adjustment disorder with mixed anxiety and depressed mood 05/11/2017  . Dehydration 12/13/2016  . Alcohol abuse   . SOB (shortness of breath)   . Chest pain 11/11/2016  . Anxiety   . Gastroesophageal reflux disease   . Osteopenia determined by x-ray 08/06/2014  . Stress fracture of calcaneus 08/06/2014  . Vitamin D deficiency 12/18/2013  . Dementia 12/18/2013  . Benign microscopic hematuria 07/06/2013  . SIADH (syndrome of inappropriate ADH production) ([place])  06/22/2013  . Hyponatremia 06/20/2013  . Ankylosing spondylitis ([place]) 06/20/2013  . Malnutrition of moderate degree ([place]) 06/20/2013  . BPH (benign prostatic hyperplasia) 08/22/2010  . Backache 04/19/2010  . ALCOHOLISM 08/13/2008  . History of colonic polyps 08/13/2008  . Essential hypertension 07/03/2007  . PUD (peptic ulcer disease) 07/03/2007  . Irritable bowel syndrome 07/03/2007    Past Surgical History:  Procedure Laterality Date  . COLONOSCOPY W/ BIOPSIES    . ESOPHAGOGASTRODUODENOSCOPY    . HERNIA REPAIR Bilateral   . TONSILLECTOMY         Home Medications    Prior to Admission medications   Medication Sig Start Date End Date Taking? Authorizing Provider  acetaminophen (TYLENOL) 500 MG tablet Take 1,000 mg by mouth every 6 (six) hours as needed (teeth pain).    [provider]  amLODipine (NORVASC) 5 MG tablet Take 1 tablet (5 mg total) by mouth daily. Patient not taking: Reported on 02/15/2017 12/14/16 12/14/17  Kathrene Alu, MD  amoxicillin-clavulanate (AUGMENTIN) 875-125 MG tablet Take 1 tablet by mouth every 12 (twelve) hours. 07/31/17   Frederica Kuster, PA-C  atorvastatin (LIPITOR) 20 MG tablet Take 1 tablet (20 mg total) by mouth daily at 6 PM. Patient not taking: Reported on 02/15/2017 11/12/16   Archie Patten, MD  Cholecalciferol (VITAMIN D) 2000 units CAPS Take 4,000 Units by mouth daily.  [provider]  dicyclomine (BENTYL) 20 MG tablet Take 1 tablet (20 mg total) by mouth 4 (four) times daily -  before meals and at bedtime. Patient not taking: Reported on 03/13/2017 02/26/17 03/08/17  Duffy Bruce, MD  ferrous sulfate 325 (65 FE) MG tablet Take 325 mg by mouth daily with breakfast.    [provider]  folic acid (FOLVITE) 1 MG tablet Take 1 tablet (1 mg total) by mouth daily. Patient not taking: Reported on 02/15/2017 12/15/16   Kathrene Alu, MD  gabapentin (NEURONTIN) 400 MG capsule Take 1 capsule (400 mg total) by  mouth 3 (three) times daily. 05/11/17   Patrecia Pour, NP  hydrOXYzine (ATARAX/VISTARIL) 25 MG tablet Take 1 tablet (25 mg total) by mouth every 6 (six) hours as needed for anxiety. 06/11/17   Robinson, [place] N, PA-C  lisinopril-hydrochlorothiazide (PRINZIDE,ZESTORETIC) 10-12.5 MG tablet Take 1 tablet by mouth at bedtime.    [provider]  LORazepam (ATIVAN) 0.5 MG tablet Take 0.5 mg by mouth 3 (three) times daily as needed for anxiety.    [provider]  meclizine (ANTIVERT) 12.5 MG tablet Take 1 tablet (12.5 mg total) by mouth 3 (three) times daily as needed for dizziness. Patient not taking: Reported on 05/10/2017 05/02/17   Delia Heady, PA-C  metoCLOPramide (REGLAN) 10 MG tablet Take 1 tablet (10 mg total) by mouth every 6 (six) hours as needed for nausea (nausea/headache). Patient not taking: Reported on 03/13/2017 01/05/17   Drenda Freeze, MD  Multiple Vitamins-Minerals (MENS ONE DAILY PO) Take 1 tablet by mouth daily.    [provider]  pantoprazole (PROTONIX) 40 MG tablet Take 1 tablet (40 mg total) by mouth daily. Patient taking differently: Take 40 mg by mouth daily with breakfast.  04/24/14   Janith Lima, MD  sodium chloride 1 g tablet Take 1 tablet (1 g total) by mouth daily. For 2 days Patient not taking: Reported on 03/24/2017 03/13/17   Margarita Mail, PA-C  sucralfate (CARAFATE) 1 G tablet Take 1 tablet (1 g total) by mouth 4 (four) times daily -  with meals and at bedtime. Chew before swallowing Patient taking differently: Take 1 g by mouth 4 (four) times daily. Chew before swallowing 12/17/13   Janith Lima, MD  thiamine 100 MG tablet Take 1 tablet (100 mg total) by mouth daily. Patient not taking: Reported on 05/08/2017 12/15/16   Kathrene Alu, MD  traZODone (DESYREL) 100 MG tablet Take 1 tablet (100 mg total) by mouth at bedtime and may repeat dose one time if needed. Patient not taking: Reported on 05/28/2017 05/11/17   Patrecia Pour,  NP  verapamil (CALAN-SR) 240 MG CR tablet Take 240 mg by mouth at bedtime. 02/09/17   [provider]  zolpidem (AMBIEN) 10 MG tablet Take 1 tablet (10 mg total) by mouth at bedtime as needed for sleep. 05/13/17 06/12/17  Delia Heady, PA-C    Family History Family History  Problem Relation Age of Onset  . Anxiety disorder Mother   . Congestive Heart Failure Mother   . Crohn's disease Mother   . Arthritis Father   . High blood pressure Father     Social History Social History   Tobacco Use  . Smoking status: Former Smoker    Types: Cigarettes  . Smokeless tobacco: Never Used  Substance Use Topics  . Alcohol use: Yes    Comment: daily/ 3-4 beers  . Drug use: No  Allergies   Diphenhydramine hcl; Flexeril [cyclobenzaprine]; and Nsaids   Review of Systems Review of Systems  Constitutional: Negative for chills and fever.  HENT: Positive for sore throat. Negative for facial swelling.   Respiratory: Negative for shortness of breath.   Cardiovascular: Positive for chest pain.  Gastrointestinal: Negative for abdominal pain, nausea and vomiting.  Genitourinary: Negative for dysuria.  Musculoskeletal: Negative for back pain.  Skin: Negative for rash and wound.  Neurological: Negative for headaches.  Psychiatric/Behavioral: The patient is nervous/anxious.      Physical Exam Updated Vital Signs BP (!) 162/84 (BP Location: Right Arm)   Pulse 90   Temp 98 F (36.7 C) (Oral)   Resp (!) 25   Ht 5' 11"  (1.803 m)   Wt 83.9 kg (185 lb)   SpO2 98%   BMI 25.80 kg/m   Physical Exam  Constitutional: He appears well-developed and well-nourished. No distress.  HENT:  Head: Normocephalic and atraumatic.  Mouth/Throat: Oropharynx is clear and moist. Abnormal dentition. Dental caries present. No oropharyngeal exudate.  Gums generally edematous and erythematous; no dental abscess noted  Eyes: Conjunctivae are normal. Pupils are equal, round, and reactive to light. Right  eye exhibits no discharge. Left eye exhibits no discharge. No scleral icterus.  Neck: Normal range of motion. Neck supple. No thyromegaly present.  Cardiovascular: Normal rate, regular rhythm, normal heart sounds and intact distal pulses. Exam reveals no gallop and no friction rub.  No murmur heard. Pulmonary/Chest: Effort normal and breath sounds normal. No stridor. No respiratory distress. He has no wheezes. He has no rales. He exhibits no tenderness.  Abdominal: Soft. Bowel sounds are normal. He exhibits no distension. There is no tenderness. There is no rebound and no guarding.  Musculoskeletal: He exhibits no edema.  Lymphadenopathy:    He has no cervical adenopathy.  Neurological: He is alert. Coordination normal.  Skin: Skin is warm and dry. No rash noted. He is not diaphoretic. No pallor.  Psychiatric: He has a normal mood and affect.  Nursing note and vitals reviewed.    ED Treatments / Results  Labs (all labs ordered are listed, but only abnormal results are displayed) Labs Reviewed  BASIC METABOLIC PANEL - Abnormal; Notable for the following components:      Result Value   Sodium 131 (*)    Chloride 93 (*)    Glucose, Bld 108 (*)    BUN <5 (*)    All other components within normal limits  CBC  I-STAT TROPONIN, ED  I-STAT TROPONIN, ED    EKG  EKG Interpretation  Date/Time:  Tuesday July 31 2017 09:36:16 EST Ventricular Rate:  76 PR Interval:  166 QRS Duration: 96 QT Interval:  406 QTC Calculation: 456 R Axis:   33 Text Interpretation:  Normal sinus rhythm no acute ST/T changes no significant change since Dec 2018 Confirmed by Sherwood Gambler [phone]) on 07/31/2017 2:59:15 PM       Radiology Dg Chest 2 View  Result Date: 07/31/2017 CLINICAL DATA:  Chest pain.  Cough and decreased appetite. EXAM: CHEST  2 VIEW COMPARISON:  05/13/2018 FINDINGS: The heart size and mediastinal contours are within normal limits. Mild aortic atherosclerosis. Both lungs are  clear. The visualized skeletal structures are unremarkable. IMPRESSION: No active cardiopulmonary disease. Electronically Signed   By: Kerby Moors M.D.   On: 07/31/2017 10:33    Procedures Procedures (including critical care time)  Medications Ordered in ED Medications  sodium chloride 0.9 % bolus 1,000 mL (0  mLs Intravenous Stopped 07/31/17 1712)  LORazepam (ATIVAN) injection 1 mg (1 mg Intravenous Given 07/31/17 1543)  ketorolac (TORADOL) 30 MG/ML injection 15 mg (15 mg Intravenous Given 07/31/17 1740)     Initial Impression / Assessment and Plan / ED Course  I have reviewed the triage vital signs and the nursing notes.  Pertinent labs & imaging results that were available during my care of the patient were reviewed by me and considered in my medical decision making (see chart for details).  Clinical Course as of Jul 31 1742  Tue Jul 31, 2017  1534 Patient asymptomatic at this time.  Patient reports feeling dehydrated and that fluids might help him feel better.  Patient also requesting something for his nerves.  Per chart review, patient given Lorazepam 1 mg in the past for anxiety.  [AL]  1720 Patient eating comfortably.  He denies any chest pain and is feeling better after lorazepam and fluids.  Patient requesting Toradol for his teeth pain.  Patient reports when he eats, he has pain in his lower teeth.  He knows that he has to follow-up with an oral surgeon to get the teeth removed and is working on trying to do that.  [AL]    Clinical Course User Index [AL] Frederica Kuster, PA-C    Patient with intermittent chest pain.  Suspected to be related to anxiety.  Patient asymptomatic in the ED.  Low suspicion for ACS or PE, or other emergent finding.  Patient feeling much improved after fluids, eating, and lorazepam 1 mg.  Delta troponin is negative.  BMP shows sodium 131, chloride 93.  CBC unremarkable.  Chest x-ray is negative.  EKG shows NSR.  Considering dental pain and erythematous  and edematous gingiva, will treat with Augmentin.  Patient plans to follow-up with oral surgery to have all teeth removed.  Patient encouraged to follow-up with oral surgeon as well as PCP.  Return precautions discussed.  Patient vitals stable and discharged in satisfactory condition.  Final Clinical Impressions(s) / ED Diagnoses   Final diagnoses:  Nonspecific chest pain  Anxiety  Pain, dental    ED Discharge Orders        Ordered    amoxicillin-clavulanate (AUGMENTIN) 875-125 MG tablet  Every 12 hours     07/31/17 1739       Frederica Kuster, PA-C 07/31/17 1744    Sherwood Gambler, MD 08/01/17 [phone]

## 2017-07-31 NOTE — ED Notes (Signed)
Meal tray delivered, pt eating

## 2017-08-19 ENCOUNTER — Encounter (HOSPITAL_COMMUNITY): Payer: Self-pay | Admitting: Emergency Medicine

## 2017-08-19 ENCOUNTER — Inpatient Hospital Stay (HOSPITAL_COMMUNITY)
Admission: EM | Admit: 2017-08-19 | Discharge: 2017-08-21 | DRG: 645 | Disposition: A | Discharge status: Home / Self Care | Payer: Medicaid Other | Attending: Internal Medicine | Admitting: Internal Medicine

## 2017-08-19 ENCOUNTER — Emergency Department (HOSPITAL_COMMUNITY): Payer: Medicaid Other

## 2017-08-19 ENCOUNTER — Other Ambulatory Visit: Payer: Self-pay

## 2017-08-19 DIAGNOSIS — E785 Hyperlipidemia, unspecified: Secondary | ICD-10-CM

## 2017-08-19 DIAGNOSIS — Z886 Allergy status to analgesic agent status: Secondary | ICD-10-CM | POA: Diagnosis not present

## 2017-08-19 DIAGNOSIS — Z66 Do not resuscitate: Secondary | ICD-10-CM | POA: Diagnosis present

## 2017-08-19 DIAGNOSIS — R634 Abnormal weight loss: Secondary | ICD-10-CM

## 2017-08-19 DIAGNOSIS — E871 Hypo-osmolality and hyponatremia: Secondary | ICD-10-CM | POA: Diagnosis not present

## 2017-08-19 DIAGNOSIS — Z87891 Personal history of nicotine dependence: Secondary | ICD-10-CM

## 2017-08-19 DIAGNOSIS — F329 Major depressive disorder, single episode, unspecified: Secondary | ICD-10-CM | POA: Diagnosis present

## 2017-08-19 DIAGNOSIS — K589 Irritable bowel syndrome without diarrhea: Secondary | ICD-10-CM | POA: Diagnosis present

## 2017-08-19 DIAGNOSIS — M459 Ankylosing spondylitis of unspecified sites in spine: Secondary | ICD-10-CM | POA: Diagnosis present

## 2017-08-19 DIAGNOSIS — M542 Cervicalgia: Secondary | ICD-10-CM | POA: Diagnosis not present

## 2017-08-19 DIAGNOSIS — D649 Anemia, unspecified: Secondary | ICD-10-CM | POA: Diagnosis present

## 2017-08-19 DIAGNOSIS — Z79899 Other long term (current) drug therapy: Secondary | ICD-10-CM | POA: Diagnosis not present

## 2017-08-19 DIAGNOSIS — F1099 Alcohol use, unspecified with unspecified alcohol-induced disorder: Secondary | ICD-10-CM

## 2017-08-19 DIAGNOSIS — Z8249 Family history of ischemic heart disease and other diseases of the circulatory system: Secondary | ICD-10-CM

## 2017-08-19 DIAGNOSIS — F1010 Alcohol abuse, uncomplicated: Secondary | ICD-10-CM | POA: Diagnosis present

## 2017-08-19 DIAGNOSIS — Z888 Allergy status to other drugs, medicaments and biological substances status: Secondary | ICD-10-CM | POA: Diagnosis not present

## 2017-08-19 DIAGNOSIS — R0982 Postnasal drip: Secondary | ICD-10-CM | POA: Diagnosis not present

## 2017-08-19 DIAGNOSIS — Z818 Family history of other mental and behavioral disorders: Secondary | ICD-10-CM

## 2017-08-19 DIAGNOSIS — E46 Unspecified protein-calorie malnutrition: Secondary | ICD-10-CM | POA: Diagnosis not present

## 2017-08-19 DIAGNOSIS — F419 Anxiety disorder, unspecified: Secondary | ICD-10-CM | POA: Diagnosis present

## 2017-08-19 DIAGNOSIS — K529 Noninfective gastroenteritis and colitis, unspecified: Secondary | ICD-10-CM | POA: Diagnosis present

## 2017-08-19 DIAGNOSIS — K5190 Ulcerative colitis, unspecified, without complications: Secondary | ICD-10-CM

## 2017-08-19 DIAGNOSIS — K219 Gastro-esophageal reflux disease without esophagitis: Secondary | ICD-10-CM | POA: Diagnosis present

## 2017-08-19 DIAGNOSIS — E222 Syndrome of inappropriate secretion of antidiuretic hormone: Secondary | ICD-10-CM | POA: Diagnosis present

## 2017-08-19 DIAGNOSIS — I10 Essential (primary) hypertension: Secondary | ICD-10-CM | POA: Diagnosis present

## 2017-08-19 DIAGNOSIS — Z598 Other problems related to housing and economic circumstances: Secondary | ICD-10-CM | POA: Diagnosis not present

## 2017-08-19 DIAGNOSIS — M549 Dorsalgia, unspecified: Secondary | ICD-10-CM | POA: Diagnosis not present

## 2017-08-19 DIAGNOSIS — Z8639 Personal history of other endocrine, nutritional and metabolic disease: Secondary | ICD-10-CM | POA: Diagnosis not present

## 2017-08-19 DIAGNOSIS — K580 Irritable bowel syndrome with diarrhea: Secondary | ICD-10-CM | POA: Diagnosis not present

## 2017-08-19 HISTORY — DX: Syndrome of inappropriate secretion of antidiuretic hormone: E22.2

## 2017-08-19 HISTORY — DX: Ankylosing spondylitis of unspecified sites in spine: M45.9

## 2017-08-19 LAB — BASIC METABOLIC PANEL
Anion gap: 12 (ref 5–15)
Anion gap: 10 (ref 5–15)
Anion gap: 8 (ref 5–15)
Anion gap: 9 (ref 5–15)
BUN: <5 mg/dL — ABNORMAL LOW (ref 6–20)
BUN: <5 mg/dL — ABNORMAL LOW (ref 6–20)
BUN: <5 mg/dL — ABNORMAL LOW (ref 6–20)
CALCIUM: 8.9 mg/dL (ref 8.9–10.3)
CHLORIDE: 85 mmol/L — AB (ref 101–111)
CO2: 24 mmol/L (ref 22–32)
CO2: 23 mmol/L (ref 22–32)
CO2: 24 mmol/L (ref 22–32)
CO2: 22 mmol/L (ref 22–32)
CREATININE: 0.77 mg/dL (ref 0.61–1.24)
Calcium: 8.4 mg/dL — ABNORMAL LOW (ref 8.9–10.3)
Calcium: 8.4 mg/dL — ABNORMAL LOW (ref 8.9–10.3)
Calcium: 8.3 mg/dL — ABNORMAL LOW (ref 8.9–10.3)
Chloride: 79 mmol/L — ABNORMAL LOW (ref 101–111)
Chloride: 87 mmol/L — ABNORMAL LOW (ref 101–111)
Chloride: 86 mmol/L — ABNORMAL LOW (ref 101–111)
Creatinine, Ser: 0.69 mg/dL (ref 0.61–1.24)
Creatinine, Ser: 0.63 mg/dL (ref 0.61–1.24)
Creatinine, Ser: 0.66 mg/dL (ref 0.61–1.24)
GFR calc Af Amer: >60 mL/min (ref >60)
GFR calc Af Amer: >60 mL/min (ref >60)
GFR calc Af Amer: >60 mL/min (ref >60)
GFR calc non Af Amer: >60 mL/min (ref >60)
GFR calc non Af Amer: >60 mL/min (ref >60)
GFR calc non Af Amer: >60 mL/min (ref >60)
GLUCOSE: 113 mg/dL — AB (ref 65–99)
Glucose, Bld: 126 mg/dL — ABNORMAL HIGH (ref 65–99)
Glucose, Bld: 95 mg/dL (ref 65–99)
Glucose, Bld: 91 mg/dL (ref 65–99)
POTASSIUM: 4 mmol/L (ref 3.5–5.1)
Potassium: 4.5 mmol/L (ref 3.5–5.1)
Potassium: 3.8 mmol/L (ref 3.5–5.1)
Potassium: 3.7 mmol/L (ref 3.5–5.1)
SODIUM: 118 mmol/L — AB (ref 135–145)
Sodium: 115 mmol/L — CL (ref 135–145)
Sodium: 119 mmol/L — CL (ref 135–145)
Sodium: 117 mmol/L — CL (ref 135–145)

## 2017-08-19 LAB — HEPATIC FUNCTION PANEL
ALK PHOS: 82 U/L (ref 38–126)
ALT: 21 U/L (ref 17–63)
AST: 29 U/L (ref 15–41)
Albumin: 3.9 g/dL (ref 3.5–5.0)
BILIRUBIN DIRECT: 0.2 mg/dL (ref 0.1–0.5)
BILIRUBIN INDIRECT: 0.7 mg/dL (ref 0.3–0.9)
BILIRUBIN TOTAL: 0.9 mg/dL (ref 0.3–1.2)
TOTAL PROTEIN: 6.8 g/dL (ref 6.5–8.1)

## 2017-08-19 LAB — CBC WITH DIFFERENTIAL/PLATELET
BASOS ABS: 0 10*3/uL (ref 0.0–0.1)
Basophils Relative: 0 %
EOS PCT: 0 %
Eosinophils Absolute: 0 10*3/uL (ref 0.0–0.7)
HEMATOCRIT: 42.9 % (ref 39.0–52.0)
Hemoglobin: 15.5 g/dL (ref 13.0–17.0)
LYMPHS PCT: 10 %
Lymphs Abs: 0.9 10*3/uL (ref 0.7–4.0)
MCH: 31.9 pg (ref 26.0–34.0)
MCHC: 36.1 g/dL — AB (ref 30.0–36.0)
MCV: 88.3 fL (ref 78.0–100.0)
MONO ABS: 1.1 10*3/uL — AB (ref 0.1–1.0)
Monocytes Relative: 12 %
NEUTROS ABS: 7 10*3/uL (ref 1.7–7.7)
Neutrophils Relative %: 78 %
PLATELETS: 348 10*3/uL (ref 150–400)
RBC: 4.86 MIL/uL (ref 4.22–5.81)
RDW: 12.3 % (ref 11.5–15.5)
WBC: 9 10*3/uL (ref 4.0–10.5)

## 2017-08-19 LAB — OSMOLALITY, URINE: Osmolality, Ur: 201 mOsm/kg — ABNORMAL LOW (ref 300–900)

## 2017-08-19 LAB — RAPID URINE DRUG SCREEN, HOSP PERFORMED
Amphetamines: NOT DETECTED
Barbiturates: NOT DETECTED
Benzodiazepines: POSITIVE — AB
Cocaine: NOT DETECTED
OPIATES: NOT DETECTED
Tetrahydrocannabinol: POSITIVE — AB

## 2017-08-19 LAB — MRSA PCR SCREENING: MRSA by PCR: NEGATIVE

## 2017-08-19 LAB — CREATININE, URINE, RANDOM: CREATININE, URINE: 54.12 mg/dL

## 2017-08-19 LAB — NA AND K (SODIUM & POTASSIUM), RAND UR
Potassium Urine: 28 mmol/L
Sodium, Ur: 41 mmol/L

## 2017-08-19 LAB — OSMOLALITY: OSMOLALITY: 248 mosm/kg — AB (ref 275–295)

## 2017-08-19 LAB — TSH: TSH: 0.949 u[IU]/mL (ref 0.350–4.500)

## 2017-08-19 MED ORDER — ONDANSETRON HCL 4 MG/2ML IJ SOLN: 4.0000 mg | Freq: Four times a day (QID) | INTRAMUSCULAR | Status: DC | PRN

## 2017-08-19 MED ORDER — SODIUM CHLORIDE 0.9 % IV BOLUS (SEPSIS)
1000.0000 mL | Freq: Once | INTRAVENOUS | Status: AC
  Administered 2017-08-19: 1000 mL via INTRAVENOUS

## 2017-08-19 MED ORDER — FAMOTIDINE IN NACL 20-0.9 MG/50ML-% IV SOLN
20.0000 mg | Freq: Once | INTRAVENOUS | Status: AC
  Administered 2017-08-19: 20 mg via INTRAVENOUS
  Filled 2017-08-19: qty 50

## 2017-08-19 MED ORDER — ACETAMINOPHEN 325 MG PO TABS
650.0000 mg | ORAL_TABLET | Freq: Four times a day (QID) | ORAL | Status: DC | PRN
  Administered 2017-08-19 – 2017-08-20 (×3): 650 mg via ORAL
  Filled 2017-08-19 (×3): qty 2

## 2017-08-19 MED ORDER — FOLIC ACID 1 MG PO TABS
1.0000 mg | ORAL_TABLET | Freq: Every day | ORAL | Status: DC
  Administered 2017-08-19 – 2017-08-21 (×3): 1 mg via ORAL
  Filled 2017-08-19 (×3): qty 1

## 2017-08-19 MED ORDER — ENOXAPARIN SODIUM 40 MG/0.4ML ~~LOC~~ SOLN
40.0000 mg | SUBCUTANEOUS | Status: DC
  Administered 2017-08-19 – 2017-08-20 (×2): 40 mg via SUBCUTANEOUS
  Filled 2017-08-19 (×2): qty 0.4

## 2017-08-19 MED ORDER — ZOLPIDEM TARTRATE 5 MG PO TABS
10.0000 mg | ORAL_TABLET | Freq: Every evening | ORAL | Status: DC | PRN
  Administered 2017-08-19 – 2017-08-20 (×2): 10 mg via ORAL
  Filled 2017-08-19 (×2): qty 2

## 2017-08-19 MED ORDER — SALINE SPRAY 0.65 % NA SOLN: 1.0000 | NASAL | Status: DC | PRN

## 2017-08-19 MED ORDER — LISINOPRIL-HYDROCHLOROTHIAZIDE 10-12.5 MG PO TABS: 1.0000 | ORAL_TABLET | Freq: Every day | ORAL | Status: DC

## 2017-08-19 MED ORDER — LIDOCAINE 5 % EX PTCH
2.0000 | MEDICATED_PATCH | CUTANEOUS | Status: DC
  Administered 2017-08-19: 2 via TRANSDERMAL
  Filled 2017-08-19 (×4): qty 2

## 2017-08-19 MED ORDER — SUCRALFATE 1 G PO TABS
1.0000 g | ORAL_TABLET | Freq: Three times a day (TID) | ORAL | Status: DC
  Administered 2017-08-19 – 2017-08-21 (×7): 1 g via ORAL
  Filled 2017-08-19 (×7): qty 1

## 2017-08-19 MED ORDER — VITAMIN B-1 100 MG PO TABS
100.0000 mg | ORAL_TABLET | Freq: Every day | ORAL | Status: DC
  Administered 2017-08-19 – 2017-08-21 (×3): 100 mg via ORAL
  Filled 2017-08-19 (×3): qty 1

## 2017-08-19 MED ORDER — LORAZEPAM 2 MG/ML IJ SOLN
2.0000 mg | INTRAMUSCULAR | Status: DC | PRN
  Administered 2017-08-19 – 2017-08-20 (×2): 2 mg via INTRAVENOUS
  Filled 2017-08-19 (×2): qty 1

## 2017-08-19 MED ORDER — FERROUS SULFATE 325 (65 FE) MG PO TABS: 325.0000 mg | ORAL_TABLET | Freq: Every day | ORAL | Status: DC

## 2017-08-19 MED ORDER — METOCLOPRAMIDE HCL 5 MG/ML IJ SOLN
10.0000 mg | Freq: Once | INTRAMUSCULAR | Status: AC
  Administered 2017-08-19: 10 mg via INTRAVENOUS
  Filled 2017-08-19: qty 2

## 2017-08-19 MED ORDER — ONDANSETRON HCL 4 MG PO TABS: 4.0000 mg | ORAL_TABLET | Freq: Four times a day (QID) | ORAL | Status: DC | PRN

## 2017-08-19 MED ORDER — SUCRALFATE 1 G PO TABS: 1.0000 g | ORAL_TABLET | Freq: Three times a day (TID) | ORAL | Status: DC | PRN

## 2017-08-19 MED ORDER — GI COCKTAIL ~~LOC~~
30.0000 mL | Freq: Once | ORAL | Status: AC
  Administered 2017-08-19: 30 mL via ORAL
  Filled 2017-08-19: qty 30

## 2017-08-19 MED ORDER — ONDANSETRON HCL 4 MG/2ML IJ SOLN
4.0000 mg | Freq: Once | INTRAMUSCULAR | Status: AC
  Administered 2017-08-19: 4 mg via INTRAVENOUS
  Filled 2017-08-19: qty 2

## 2017-08-19 MED ORDER — ADULT MULTIVITAMIN W/MINERALS CH: 1.0000 | ORAL_TABLET | Freq: Every day | ORAL | Status: DC

## 2017-08-19 MED ORDER — ACETAMINOPHEN 650 MG RE SUPP: 650.0000 mg | Freq: Four times a day (QID) | RECTAL | Status: DC | PRN

## 2017-08-19 MED ORDER — POLYETHYLENE GLYCOL 3350 17 G PO PACK: 17.0000 g | PACK | Freq: Every day | ORAL | Status: DC | PRN

## 2017-08-19 MED ORDER — PANTOPRAZOLE SODIUM 40 MG PO TBEC
40.0000 mg | DELAYED_RELEASE_TABLET | Freq: Every day | ORAL | Status: DC
  Administered 2017-08-19 – 2017-08-21 (×3): 40 mg via ORAL
  Filled 2017-08-19 (×3): qty 1

## 2017-08-19 MED ORDER — DEXTROSE 5 % IV SOLN: INTRAVENOUS | Status: DC

## 2017-08-19 MED ORDER — SODIUM CHLORIDE 0.9% FLUSH
3.0000 mL | Freq: Two times a day (BID) | INTRAVENOUS | Status: DC
  Administered 2017-08-19 – 2017-08-21 (×5): 3 mL via INTRAVENOUS

## 2017-08-19 MED ORDER — ALPRAZOLAM 0.5 MG PO TABS
0.5000 mg | ORAL_TABLET | Freq: Three times a day (TID) | ORAL | Status: DC | PRN
  Administered 2017-08-19: 0.5 mg via ORAL
  Filled 2017-08-19: qty 2
  Filled 2017-08-19: qty 1

## 2017-08-19 MED ORDER — VERAPAMIL HCL ER 240 MG PO TBCR
240.0000 mg | EXTENDED_RELEASE_TABLET | Freq: Every day | ORAL | Status: DC
  Administered 2017-08-19 – 2017-08-20 (×2): 240 mg via ORAL
  Filled 2017-08-19 (×2): qty 1
  Filled 2017-08-19: qty 2

## 2017-08-19 NOTE — ED Triage Notes (Signed)
Pt arrives from home via [facility] for c.o. Several days of headache, 9/10 with generalized weakness and muscle cramping. Pt also reports nausea. Afebrile at triage.

## 2017-08-19 NOTE — ED Notes (Signed)
Report given.

## 2017-08-19 NOTE — H&P (Addendum)
Date: 08/19/2017               Patient Name:  Anthony Lamb MRN: [id number]  DOB: 1955-10-08 Age / Sex: 62 y.o., male   PCP: Sandi Mariscal, MD         Medical Service: Internal Medicine Teaching Service         Attending Physician: Dr. Oda Kilts, MD    First Contact: Dr. Aggie Hacker Pager: [phone]  Second Contact: Dr. Danford Bad Pager: [phone]       After Hours (After 5p/  First Contact Pager: [phone]  weekends / holidays): Second Contact Pager: [phone]   Chief Complaint: headache, generalized weakness  History of Present Illness:  62 yo male with PMH of HTN, GERD, anxiety, UC, HLD, ETOH use disorder presenting with severe headaches, generalized weakness, leg cramps,  fatigue, and poor PO intake for the past several days, but acutely worsened yesterday. The patient states his headaches are 9/10 in severity, throbbing, localized to the top of his head and bilateral temples, not relieved by tylenol. He also complains of blurry vision. Patient states he feels fatigued, weak, and dehydrated, and has no appetite. He is very concerned he has cancer. No recent infections, fevers, or vomiting. He has been having diarrhea, which is chronic and stable; 2-3 loose bowel movements daily. He denies dysuria or hematuria. He endorses chest pain and shortness of breath on exertion. He attributes the CP to anxiety and the DOE to his ankylosing spondylosis. He also endorses swelling in his lower extremities.  He endorses non-intentional weight loss of 15 lb within the last few months. He complains of dysphagia, but contributes it to post nasal drip. Patient states that he gets one meal daily from meals on wheels and then eats TV dinners for his other meals. State he drinks approximately 3-4 lite beers daily.   EGD 12/2008- normal EGD Colonoscopy 10/2008 - colitis in the left colon, external hemorrhoids   ED Course: Vitals: Blood pressure (!) 171/82, pulse 82, temperature 98.4 F (36.9 C), temperature  source Oral, resp. rate 16, SpO2 99 %. Labs: Na 115, K 4.0, Cr 0.69, CBC WNL Meds: Reglan, Zofran, GI cocktail, IV pepcid, and 1 L NS bolus Imaging: CT head -negative   Meds:  Current Meds  Medication Sig  . acetaminophen (TYLENOL) 500 MG tablet Take 1,000 mg by mouth every 6 (six) hours as needed (teeth pain).  . Cholecalciferol (VITAMIN D) 2000 units CAPS Take 4,000 Units by mouth daily.   . ferrous sulfate 325 (65 FE) MG tablet Take 325 mg by mouth daily with breakfast.  . hydrOXYzine (ATARAX/VISTARIL) 25 MG tablet Take 1 tablet (25 mg total) by mouth every 6 (six) hours as needed for anxiety.  Marland Kitchen lisinopril-hydrochlorothiazide (PRINZIDE,ZESTORETIC) 10-12.5 MG tablet Take 1 tablet by mouth at bedtime.  Marland Kitchen LORazepam (ATIVAN) 0.5 MG tablet Take 0.5 mg by mouth 3 (three) times daily as needed for anxiety.  . Multiple Vitamins-Minerals (MENS ONE DAILY PO) Take 1 tablet by mouth daily.  . pantoprazole (PROTONIX) 40 MG tablet Take 1 tablet (40 mg total) by mouth daily. (Patient taking differently: Take 40 mg by mouth daily with breakfast. )  . sodium chloride (OCEAN) 0.65 % SOLN nasal spray Place 1 spray into both nostrils as needed for congestion.  . sucralfate (CARAFATE) 1 G tablet Take 1 tablet (1 g total) by mouth 4 (four) times daily -  with meals and at bedtime. Chew before swallowing (Patient taking differently: Take  1 g by mouth 4 (four) times daily. Chew before swallowing)  . verapamil (CALAN-SR) 240 MG CR tablet Take 240 mg by mouth at bedtime.  Marland Kitchen zolpidem (AMBIEN) 10 MG tablet Take 1 tablet (10 mg total) by mouth at bedtime as needed for sleep.     Allergies: Allergies as of 08/19/2017 - Review Complete 08/19/2017  Allergen Reaction Noted  . Diphenhydramine hcl Other (See Comments) 12/17/2008  . Flexeril [cyclobenzaprine] Other (See Comments) 06/20/2013  . Nsaids Other (See Comments) 05/17/2010   Past Medical History:  Diagnosis Date  . Alcohol abuse, in remission   . Anemia     . Anxiety   . Chronic pain   . Colitis   . Depression   . Esophagitis   . Gastritis   . GERD (gastroesophageal reflux disease)   . Hypertension   . IBS (irritable bowel syndrome)   . Poor dentition     Family History:  Family History  Problem Relation Age of Onset  . Anxiety disorder Mother   . Congestive Heart Failure Mother   . Crohn's disease Mother   . Arthritis Father   . High blood pressure Father     Social History:  Social History   Tobacco Use  . Smoking status: Former Smoker    Types: Cigarettes  . Smokeless tobacco: Never Used  Substance Use Topics  . Alcohol use: Yes    Comment: daily/ 3-4 beers  . Drug use: No    Review of Systems: A complete ROS was negative except as per HPI.   Physical Exam: Blood pressure (!) 171/82, pulse 82, temperature 98.4 F (36.9 C), temperature source Oral, resp. rate 16, SpO2 99 %. Physical Exam  Constitutional: He is oriented to person, place, and time. He appears well-developed and well-nourished. No distress.  Appears older than stated age.   Eyes: Conjunctivae and EOM are normal. Pupils are equal, round, and reactive to light. No scleral icterus.  Neck: Normal range of motion. Neck supple.  Cardiovascular: Normal rate, regular rhythm and normal heart sounds.  Pulmonary/Chest: Breath sounds normal. No respiratory distress. He has no wheezes.  Abdominal: Soft. Bowel sounds are normal. He exhibits no distension. There is no tenderness.  Musculoskeletal: Normal range of motion. He exhibits edema (trace peripheral edema ).  Neurological: He is alert and oriented to person, place, and time. He has normal strength. No cranial nerve deficit or sensory deficit.  Skin: Skin is warm and dry.   EKG: none to review   CXR: none to review  Assessment & Plan by Problem: Principal Problem:   Hyponatremia Active Problems:   Ankylosing spondylitis ([place])  Hyponatremia Patient presenting with headaches, generalized fatigue,  leg cramps, and weakness, found to have a sodium level of 115 consistent with symptomatic with hyponatremia. Patient is euvolemic on examination, no history of CHF or cirrhosis; hepatic function panel within normal limits. No focal neurologic deficits, patient is alert and oriented x 3. Patient has chronic hyponatremia 124-131 in the past, but has never been this low. Unclear etiology at this time, may be secondary to beer intake and decreased PO intake in setting of HCTZ. CT head negative for acute intracranial abnormality. -Admit to step down -Fluid restriction - 1500 mL daily -Goal of correction, 4-6 meq/ 24 hours; no more than 8 meq in 24 hours -If over corrects - give D5W -Q4 BMETs -holding lisinopril-HCTZ -Strict I/Os  ETOH Use Patient denies previous withdrawals or withdrawal seizures. Per patient drinks 3-4 beers daily.  -  CIWA protocol with Ativan   HTN Hypertensive on arrival, BP 171/82.  -Holding Lisinopril-HCTZ -Continue Verapamil 240 mg daily   GERD Continue protonix 40 mg daily -Continue Carafate TID WC PRN  Anxiety Patient takes xanax 0.5 mg TID, confirmed on [place] Database. -Will continue xanax 0.5 mg TID PRN  Code Status: DNR/DNI, confirmed on admission DVT ppx: Lovenox Diet:  HH/fluid restriction, 1500 mL  Dispo: Admit patient to Inpatient with expected length of stay greater than 2 midnights.  Signed: Melanee Spry, MD 08/19/2017, 2:45 PM  Pager: [phone]

## 2017-08-19 NOTE — ED Provider Notes (Signed)
[place] EMERGENCY DEPARTMENT Provider Note   CSN: [id number] Arrival date & time: 08/19/17  5993     History   Chief Complaint Chief Complaint  Patient presents with  . Headache  . Weakness  . Nausea    HPI Anthony Lamb is a 62 y.o. male who presents with a headache, generalized weakness, nausea, and muscle twitching and cramping. PMH significant for hx of alcohol abuse, in remission, anxiety, chronic pain, HTN, and IBS.  He states that he feels dehydrated due to poor PO intake.  He lives in an apartment by himself.  His headache started a couple days ago and has gradually worsened. It's on both sides of his temples and on the top of his head.  He has had similar headaches.  He had numbness of entire face but not now. Also feels nauseous. Takes a PPI and Carafate for epigastric pain and didn't take today. Has been having twitching, spasms, cramping of the muscles in upper and lower extremities. No fever or vomiting.  HPI  Past Medical History:  Diagnosis Date  . Alcohol abuse, in remission   . Anemia   . Anxiety   . Chronic pain   . Colitis   . Depression   . Esophagitis   . Gastritis   . GERD (gastroesophageal reflux disease)   . Hypertension   . IBS (irritable bowel syndrome)   . Poor dentition     Patient Active Problem List   Diagnosis Date Noted  . Adjustment disorder with mixed anxiety and depressed mood 05/11/2017  . Dehydration 12/13/2016  . Alcohol abuse   . SOB (shortness of breath)   . Chest pain 11/11/2016  . Anxiety   . Gastroesophageal reflux disease   . Osteopenia determined by x-ray 08/06/2014  . Stress fracture of calcaneus 08/06/2014  . Vitamin D deficiency 12/18/2013  . Dementia 12/18/2013  . Benign microscopic hematuria 07/06/2013  . SIADH (syndrome of inappropriate ADH production) ([place]) 06/22/2013  . Hyponatremia 06/20/2013  . Ankylosing spondylitis ([place]) 06/20/2013  . Malnutrition of moderate degree ([place])  06/20/2013  . BPH (benign prostatic hyperplasia) 08/22/2010  . Backache 04/19/2010  . ALCOHOLISM 08/13/2008  . History of colonic polyps 08/13/2008  . Essential hypertension 07/03/2007  . PUD (peptic ulcer disease) 07/03/2007  . Irritable bowel syndrome 07/03/2007    Past Surgical History:  Procedure Laterality Date  . COLONOSCOPY W/ BIOPSIES    . ESOPHAGOGASTRODUODENOSCOPY    . HERNIA REPAIR Bilateral   . TONSILLECTOMY         Home Medications    Prior to Admission medications   Medication Sig Start Date End Date Taking? Authorizing Provider  acetaminophen (TYLENOL) 500 MG tablet Take 1,000 mg by mouth every 6 (six) hours as needed (teeth pain).    [provider]  amLODipine (NORVASC) 5 MG tablet Take 1 tablet (5 mg total) by mouth daily. Patient not taking: Reported on 02/15/2017 12/14/16 12/14/17  Kathrene Alu, MD  amoxicillin-clavulanate (AUGMENTIN) 875-125 MG tablet Take 1 tablet by mouth every 12 (twelve) hours. 07/31/17   Frederica Kuster, PA-C  atorvastatin (LIPITOR) 20 MG tablet Take 1 tablet (20 mg total) by mouth daily at 6 PM. Patient not taking: Reported on 02/15/2017 11/12/16   Archie Patten, MD  Cholecalciferol (VITAMIN D) 2000 units CAPS Take 4,000 Units by mouth daily.     [provider]  dicyclomine (BENTYL) 20 MG tablet Take 1 tablet (20 mg total) by mouth 4 (four)  times daily -  before meals and at bedtime. Patient not taking: Reported on 03/13/2017 02/26/17 03/08/17  Duffy Bruce, MD  ferrous sulfate 325 (65 FE) MG tablet Take 325 mg by mouth daily with breakfast.    [provider]  folic acid (FOLVITE) 1 MG tablet Take 1 tablet (1 mg total) by mouth daily. Patient not taking: Reported on 02/15/2017 12/15/16   Kathrene Alu, MD  gabapentin (NEURONTIN) 400 MG capsule Take 1 capsule (400 mg total) by mouth 3 (three) times daily. 05/11/17   Patrecia Pour, NP  hydrOXYzine (ATARAX/VISTARIL) 25 MG tablet Take 1 tablet (25 mg  total) by mouth every 6 (six) hours as needed for anxiety. 06/11/17   Robinson, [place] N, PA-C  lisinopril-hydrochlorothiazide (PRINZIDE,ZESTORETIC) 10-12.5 MG tablet Take 1 tablet by mouth at bedtime.    [provider]  LORazepam (ATIVAN) 0.5 MG tablet Take 0.5 mg by mouth 3 (three) times daily as needed for anxiety.    [provider]  meclizine (ANTIVERT) 12.5 MG tablet Take 1 tablet (12.5 mg total) by mouth 3 (three) times daily as needed for dizziness. Patient not taking: Reported on 05/10/2017 05/02/17   Delia Heady, PA-C  metoCLOPramide (REGLAN) 10 MG tablet Take 1 tablet (10 mg total) by mouth every 6 (six) hours as needed for nausea (nausea/headache). Patient not taking: Reported on 03/13/2017 01/05/17   Drenda Freeze, MD  Multiple Vitamins-Minerals (MENS ONE DAILY PO) Take 1 tablet by mouth daily.    [provider]  pantoprazole (PROTONIX) 40 MG tablet Take 1 tablet (40 mg total) by mouth daily. Patient taking differently: Take 40 mg by mouth daily with breakfast.  04/24/14   Janith Lima, MD  sodium chloride 1 g tablet Take 1 tablet (1 g total) by mouth daily. For 2 days Patient not taking: Reported on 03/24/2017 03/13/17   Margarita Mail, PA-C  sucralfate (CARAFATE) 1 G tablet Take 1 tablet (1 g total) by mouth 4 (four) times daily -  with meals and at bedtime. Chew before swallowing Patient taking differently: Take 1 g by mouth 4 (four) times daily. Chew before swallowing 12/17/13   Janith Lima, MD  thiamine 100 MG tablet Take 1 tablet (100 mg total) by mouth daily. Patient not taking: Reported on 05/08/2017 12/15/16   Kathrene Alu, MD  traZODone (DESYREL) 100 MG tablet Take 1 tablet (100 mg total) by mouth at bedtime and may repeat dose one time if needed. Patient not taking: Reported on 05/28/2017 05/11/17   Patrecia Pour, NP  verapamil (CALAN-SR) 240 MG CR tablet Take 240 mg by mouth at bedtime. 02/09/17   [provider]  zolpidem  (AMBIEN) 10 MG tablet Take 1 tablet (10 mg total) by mouth at bedtime as needed for sleep. 05/13/17 06/12/17  Delia Heady, PA-C    Family History Family History  Problem Relation Age of Onset  . Anxiety disorder Mother   . Congestive Heart Failure Mother   . Crohn's disease Mother   . Arthritis Father   . High blood pressure Father     Social History Social History   Tobacco Use  . Smoking status: Former Smoker    Types: Cigarettes  . Smokeless tobacco: Never Used  Substance Use Topics  . Alcohol use: Yes    Comment: daily/ 3-4 beers  . Drug use: No     Allergies   Diphenhydramine hcl; Flexeril [cyclobenzaprine]; and Nsaids   Review of Systems Review of Systems  Constitutional: Negative for fever.  Respiratory: Negative for shortness of breath.   Cardiovascular: Negative for chest pain.  Gastrointestinal: Positive for nausea. Negative for abdominal pain, diarrhea and vomiting.  Musculoskeletal: Positive for myalgias.  Neurological: Positive for tremors, light-headedness, numbness and headaches. Negative for syncope.  All other systems reviewed and are negative.    Physical Exam Updated Vital Signs BP (!) 171/82 (BP Location: Left Arm)   Pulse 82   Temp 98.4 F (36.9 C) (Oral)   Resp 16   SpO2 99%   Physical Exam  Constitutional: He is oriented to person, place, and time. He appears well-developed and well-nourished. No distress.  HENT:  Head: Normocephalic and atraumatic.  Eyes: Conjunctivae are normal. Pupils are equal, round, and reactive to light. Right eye exhibits no discharge. Left eye exhibits no discharge. No scleral icterus.  Neck: Normal range of motion.  Cardiovascular: Normal rate and regular rhythm.  Pulmonary/Chest: Effort normal and breath sounds normal. No respiratory distress.  Abdominal: Soft. Bowel sounds are normal. He exhibits no distension.  Neurological: He is alert and oriented to person, place, and time.  Lying on stretcher in NAD.  GCS 15. Speaks in a clear voice. Cranial nerves II through XII grossly intact. 5/5 strength in all extremities. Sensation fully intact.  Bilateral finger-to-nose intact. Ambulatory    Skin: Skin is warm and dry.  Psychiatric: His behavior is normal. His mood appears anxious.  Nursing note and vitals reviewed.    ED Treatments / Results  Labs (all labs ordered are listed, but only abnormal results are displayed) Labs Reviewed  BASIC METABOLIC PANEL - Abnormal; Notable for the following components:      Result Value   Sodium 115 (*)    Chloride 79 (*)    Glucose, Bld 113 (*)    BUN <5 (*)    All other components within normal limits  CBC WITH DIFFERENTIAL/PLATELET - Abnormal; Notable for the following components:   MCHC 36.1 (*)    Monocytes Absolute 1.1 (*)    All other components within normal limits  RAPID URINE DRUG SCREEN, HOSP PERFORMED - Abnormal; Notable for the following components:   Benzodiazepines POSITIVE (*)    Tetrahydrocannabinol POSITIVE (*)    All other components within normal limits  BASIC METABOLIC PANEL - Abnormal; Notable for the following components:   Sodium 118 (*)    Chloride 85 (*)    Glucose, Bld 126 (*)    BUN <5 (*)    Calcium 8.4 (*)    All other components within normal limits  OSMOLALITY, URINE - Abnormal; Notable for the following components:   Osmolality, Ur 201 (*)    All other components within normal limits  OSMOLALITY - Abnormal; Notable for the following components:   Osmolality 248 (*)    All other components within normal limits  NA AND K (SODIUM & POTASSIUM), RAND UR  HEPATIC FUNCTION PANEL  CREATININE, URINE, RANDOM  TSH  BASIC METABOLIC PANEL  BASIC METABOLIC PANEL  BASIC METABOLIC PANEL    EKG  EKG Interpretation None       Radiology Ct Head Wo Contrast  Result Date: 08/19/2017 CLINICAL DATA:  c.o. Several days of headache, generalized weakness and muscle cramping. Pt scanned left side. up EXAM: CT HEAD WITHOUT  CONTRAST TECHNIQUE: Contiguous axial images were obtained from the base of the skull through the vertex without intravenous contrast. COMPARISON:  08/23/2016 FINDINGS: Brain: No acute intracranial hemorrhage. No focal mass lesion. No CT evidence of  acute infarction. No midline shift or mass effect. No hydrocephalus. Basilar cisterns are patent. Vascular: No hyperdense vessel or unexpected calcification. Skull: Normal. Negative for fracture or focal lesion. Sinuses/Orbits: Mild mucosal inflammation in the sphenoid sinus. Mild mucosal thickening in the LEFT mastoid air cells. Other: None. IMPRESSION: No acute intracranial findings.  Trace sinus inflammation. Electronically Signed   By: Suzy Bouchard M.D.   On: 08/19/2017 11:35    Procedures Procedures (including critical care time)  Medications Ordered in ED Medications  ferrous sulfate tablet 325 mg (not administered)  folic acid (FOLVITE) tablet 1 mg (not administered)  pantoprazole (PROTONIX) EC tablet 40 mg (not administered)  sodium chloride (OCEAN) 0.65 % nasal spray 1 spray (not administered)  sucralfate (CARAFATE) tablet 1 g (not administered)  verapamil (CALAN-SR) CR tablet 240 mg (not administered)  enoxaparin (LOVENOX) injection 40 mg (not administered)  sodium chloride flush (NS) 0.9 % injection 3 mL (not administered)  acetaminophen (TYLENOL) tablet 650 mg (not administered)    Or  acetaminophen (TYLENOL) suppository 650 mg (not administered)  ondansetron (ZOFRAN) tablet 4 mg (not administered)    Or  ondansetron (ZOFRAN) injection 4 mg (not administered)  polyethylene glycol (MIRALAX / GLYCOLAX) packet 17 g (not administered)  thiamine (VITAMIN B-1) tablet 100 mg (not administered)  multivitamin with minerals tablet 1 tablet (not administered)  ALPRAZolam (XANAX) tablet 0.5 mg (0.5 mg Oral Given 08/19/17 1435)  zolpidem (AMBIEN) tablet 10 mg (not administered)  LORazepam (ATIVAN) injection 2-3 mg (not administered)  sodium  chloride 0.9 % bolus 1,000 mL (1,000 mLs Intravenous New Bag/Given 08/19/17 0946)  ondansetron (ZOFRAN) injection 4 mg (4 mg Intravenous Given 08/19/17 0944)  famotidine (PEPCID) IVPB 20 mg premix (0 mg Intravenous Stopped 08/19/17 1014)  metoCLOPramide (REGLAN) injection 10 mg (10 mg Intravenous Given 08/19/17 1026)  gi cocktail (Maalox,Lidocaine,Donnatal) (30 mLs Oral Given 08/19/17 1200)     Initial Impression / Assessment and Plan / ED Course  I have reviewed the triage vital signs and the nursing notes.  Pertinent labs & imaging results that were available during my care of the patient were reviewed by me and considered in my medical decision making (see chart for details).  62 year old male presents with generalized weakness, headache, nausea and muscle cramping.  He is hypertensive but otherwise vital signs look normal.  He has no focal weakness on exam.  He reports feeling dehydrated and is tremulous.  Will initiate labs and fluids and obtain head CT.  CMP is remarkable for marked hyponatremia of 115.  CBC is normal.  CT head is normal.  He feels less nauseous after Zofran and Reglan.  She had visit with Dr. Thomasene Lot.  Discussed with IM team who will come to admit.  Final Clinical Impressions(s) / ED Diagnoses   Final diagnoses:  Hyponatremia    ED Discharge Orders    None       Recardo Evangelist, PA-C 08/19/17 1511    Macarthur Critchley, MD 08/26/17 [phone]

## 2017-08-19 NOTE — Progress Notes (Signed)
NA 117, IMTS resident paged.

## 2017-08-19 NOTE — ED Notes (Addendum)
Pt ambulating in hallway to restroom

## 2017-08-19 NOTE — ED Notes (Signed)
Pt requested something for anxiety, PRN xanax released from signed and held and administered.

## 2017-08-19 NOTE — H&P (Signed)
Date: 08/19/2017               Patient Name:  Anthony Lamb MRN: [id number]  DOB: 14-Jan-1956 Age / Sex: 62 y.o., male   PCP: Sandi Mariscal, MD              Medical Service: Internal Medicine Teaching Service              Attending Physician: Dr. Thomasene Lot, Fredia Sorrow, *    First Contact: Dr. Aggie Hacker, MD Pager: [phone]  Second Contact: Dr. Danford Bad, MD Pager: [phone]            After Hours (After 5p/  First Contact Pager: [phone]  weekends / holidays): Second Contact Pager: [phone]   Chief Complaint: Generalized eakness and headaches  History of Present Illness: Anthony Lamb is a 62 year old male with a past medical history of Ulcerative Colitis, Ankylosing Spondylitis, GERD, and anxiety who presents to the ED with a one week history of weakness, headaches, decreased appetite, and intermittent chest pain.   Patient reports a decreased appetite for the last few days. He reports eating only 1 meal a day for the past week. Patient mentioned some recent financial hardships that have resulted in food insecurity. Patient also reports feeling dehydrated and weak and says he has not been drinking a lot fluids the past week. Currently, his 1 meal per day is provided by [employer]. The patient reports that sometimes he sometimes has trouble swallowing fluids and solids.He also reports chronic diarrhea that consist of 2-3 loose bowels daily. His diarrhea has been stable. He attributes this to his chronic GERD. He denies any recent infections, fevers, nausea, or vomiting. Patient is concerned that he has some type of cancer given his 15 pound weight loss in the last few months and family history for colon cancer in his mother. Patient reports bilateral intermittent tremors in his upper and lower extremities, severe headaches, muscle cramps and blurry vision. He rates the severity of his headaches 9/10. The patient also describes feeling weak with ambulation. The patient reports that he's been  drinking about 3-4 light beers/per day recently. His last drink was yesterday night. Patient underwent an EGD in 12/2008. Results were normal. The patient also underwent a colonoscopy in 10/2008. Results showed ulcerative colitis in the left colon and external hemorrhoids.    Meds: No current facility-administered medications for this encounter.    Current Outpatient Medications  Medication Sig Dispense Refill  . acetaminophen (TYLENOL) 500 MG tablet Take 1,000 mg by mouth every 6 (six) hours as needed (teeth pain).    . Cholecalciferol (VITAMIN D) 2000 units CAPS Take 4,000 Units by mouth daily.     . ferrous sulfate 325 (65 FE) MG tablet Take 325 mg by mouth daily with breakfast.    . hydrOXYzine (ATARAX/VISTARIL) 25 MG tablet Take 1 tablet (25 mg total) by mouth every 6 (six) hours as needed for anxiety. 16 tablet 0  . lisinopril-hydrochlorothiazide (PRINZIDE,ZESTORETIC) 10-12.5 MG tablet Take 1 tablet by mouth at bedtime.    Marland Kitchen LORazepam (ATIVAN) 0.5 MG tablet Take 0.5 mg by mouth 3 (three) times daily as needed for anxiety.    . Multiple Vitamins-Minerals (MENS ONE DAILY PO) Take 1 tablet by mouth daily.    . pantoprazole (PROTONIX) 40 MG tablet Take 1 tablet (40 mg total) by mouth daily. (Patient taking differently: Take 40 mg by mouth daily with breakfast. ) 30 tablet 11  . sodium chloride (OCEAN) 0.65 %  SOLN nasal spray Place 1 spray into both nostrils as needed for congestion.    . sucralfate (CARAFATE) 1 G tablet Take 1 tablet (1 g total) by mouth 4 (four) times daily -  with meals and at bedtime. Chew before swallowing (Patient taking differently: Take 1 g by mouth 4 (four) times daily. Chew before swallowing) 120 tablet 5  . verapamil (CALAN-SR) 240 MG CR tablet Take 240 mg by mouth at bedtime.  2  . zolpidem (AMBIEN) 10 MG tablet Take 1 tablet (10 mg total) by mouth at bedtime as needed for sleep. 4 tablet 0  . amLODipine (NORVASC) 5 MG tablet Take 1 tablet (5 mg total) by mouth daily.  (Patient not taking: Reported on 02/15/2017) 30 tablet 0  . atorvastatin (LIPITOR) 20 MG tablet Take 1 tablet (20 mg total) by mouth daily at 6 PM. (Patient not taking: Reported on 02/15/2017) 30 tablet 0  . dicyclomine (BENTYL) 20 MG tablet Take 1 tablet (20 mg total) by mouth 4 (four) times daily -  before meals and at bedtime. (Patient not taking: Reported on 03/13/2017) 40 tablet 0  . folic acid (FOLVITE) 1 MG tablet Take 1 tablet (1 mg total) by mouth daily. (Patient not taking: Reported on 02/15/2017) 30 tablet 0  . gabapentin (NEURONTIN) 400 MG capsule Take 1 capsule (400 mg total) by mouth 3 (three) times daily. (Patient not taking: Reported on 08/19/2017) 90 capsule 0  . meclizine (ANTIVERT) 12.5 MG tablet Take 1 tablet (12.5 mg total) by mouth 3 (three) times daily as needed for dizziness. (Patient not taking: Reported on 05/10/2017) 30 tablet 0  . metoCLOPramide (REGLAN) 10 MG tablet Take 1 tablet (10 mg total) by mouth every 6 (six) hours as needed for nausea (nausea/headache). (Patient not taking: Reported on 03/13/2017) 6 tablet 0  . sodium chloride 1 g tablet Take 1 tablet (1 g total) by mouth daily. For 2 days (Patient not taking: Reported on 03/24/2017) 2 tablet 0  . thiamine 100 MG tablet Take 1 tablet (100 mg total) by mouth daily. (Patient not taking: Reported on 05/08/2017) 30 tablet 0  . traZODone (DESYREL) 100 MG tablet Take 1 tablet (100 mg total) by mouth at bedtime and may repeat dose one time if needed. (Patient not taking: Reported on 05/28/2017) 30 tablet 0    Allergies: Allergies as of 08/19/2017 - Review Complete 08/19/2017  Allergen Reaction Noted  . Diphenhydramine hcl Other (See Comments) 12/17/2008  . Flexeril [cyclobenzaprine] Other (See Comments) 06/20/2013  . Nsaids Other (See Comments) 05/17/2010   Past Medical History:  Diagnosis Date  . Alcohol abuse, in remission   . Anemia   . Anxiety   . Chronic pain   . Colitis   . Depression   . Esophagitis   .  Gastritis   . GERD (gastroesophageal reflux disease)   . Hypertension   . IBS (irritable bowel syndrome)   . Poor dentition    Past Surgical History:  Procedure Laterality Date  . COLONOSCOPY W/ BIOPSIES    . ESOPHAGOGASTRODUODENOSCOPY    . HERNIA REPAIR Bilateral   . TONSILLECTOMY     Family History  Problem Relation Age of Onset  . Anxiety disorder Mother   . Congestive Heart Failure Mother   . Crohn's disease Mother   . Arthritis Father   . High blood pressure Father    Social History   Socioeconomic History  . Marital status: Single    Spouse name: Not on file  .  Number of children: Not on file  . Years of education: Not on file  . Highest education level: Not on file  Social Needs  . Financial resource strain: Not on file  . Food insecurity - worry: Not on file  . Food insecurity - inability: Not on file  . Transportation needs - medical: Not on file  . Transportation needs - non-medical: Not on file  Occupational History  . Not on file  Tobacco Use  . Smoking status: Former Smoker    Types: Cigarettes  . Smokeless tobacco: Never Used  Substance and Sexual Activity  . Alcohol use: Yes    Comment: daily/ 3-4 beers  . Drug use: No  . Sexual activity: No  Other Topics Concern  . Not on file  Social History Narrative   HSG. Long - term disability - unable to work. Lived with his mother in her house - she died April 23, 2023 - he is bankrupt/destitute and soon will loose his home. He has not worked enough quarters to be eligible for [employer]. He is advised to seek assistance for health care either with [facility].    Review of Systems: Constitutional: positive for anorexia and weight loss Eyes: positive for visual disturbance Ears, nose, mouth, throat, and face: negative Respiratory: negative Cardiovascular: positive for chest pain and exertional chest pressure/discomfort Gastrointestinal: positive for abdominal pain, diarrhea,  dysphagia, melena and reflux symptoms Genitourinary:negative Integument/breast: negative Musculoskeletal:positive for back pain Neurological: positive for dizziness, headaches and weakness Behavioral/Psych: positive for anxiety  Physical Exam: Blood pressure (!) 171/82, pulse 82, temperature 98.4 F (36.9 C), temperature source Oral, resp. rate 16, SpO2 99 %. General appearance: alert, cooperative and no distress Head: Normocephalic, without obvious abnormality, atraumatic Eyes: conjunctivae/corneas clear. PERRL, EOM's intact. Fundi benign. Lungs: clear to auscultation bilaterally Heart: regular rate and rhythm, S1, S2 normal, no murmur, click, rub or gallop Abdomen: soft, non-tender; bowel sounds normal; no masses,  no organomegaly Extremities: extremities normal, atraumatic, no cyanosis or edema Pulses: 2+ and symmetric Skin: Skin color, texture, turgor normal. No rashes or lesions Neurologic: Grossly normal  Lab results: @LABTEST @  Imaging results:  None   Assessment & Plan by Problem:  Charolette Forward is a 62 year old male with a past medical historyof Ulcerative Colitis, Ankylosing Spondylitis, GERD, and anxiety who presents to the ED with a one week history of weakness, headaches, decreased appetite, and intermittent chest pain.   Hyponatremia:   Upon admission to the ED, patient presented with generalized fatigue, weakness, headaches, anorexia and was found to have a serum sodium of 119 and  plasma osmolality of 248, most consistent with symptomatic true hyponatremia. On physical exam, the showed no signs of focal neurologic deficits or abnormalvolume status. The patient has a history of chronic hyponatremia that has required multiple hospitalizations. His serum sodiums from prior hospitalizations for hyponatremia have ranged from 124 to 131 but they've never been as low as 119. The etiology of this hyponatremia is unclear at this time but it may be secondary to his HCTZ ,  excess beer intake, or decreased intake of nutritious meals. The plan for this patient will be:  -Fluid restriction to no more than 1500 mL/day -Close monitoring of the patient in the step-down unit for hyponatremia over-correction. Correction goal: 4-6 mEQ/24 hours. -In the event that the patient's hyponatremia over-corrects, the patient should be given D5W. -Strict I/Os -BMETq4  HTN Patient's BP upon admission to the ED was 171/82.  -  Hold Lisinopril and HCTZ due to possibility of worsening hyponatremia -Continue Verapamil 240 mg daily   ETOH Use Patient reported drinking 3-4 light beers/day. Reported that his most recent drink was yesterday evening. Patient denied any previous history of withdrawals or withdrawal seizures.  -CIWA protocol with Ativan   Anxiety -Patient currently on xanax 0.5 mg TID. Continue xanax 0.5 mg TID PRN   GERD -Continue protonix 40 mg daily -Continue Carafate TID WC PRN   This is a Careers information officer Note.  The care of the patient was discussed with Dr. Danford Bad and the assessment and plan was formulated with their assistance.  Please see their note for official documentation of the patient encounter.   Signed: Jenell Milliner, Medical Student 08/19/2017, 1:45 PM

## 2017-08-20 ENCOUNTER — Encounter (HOSPITAL_COMMUNITY): Payer: Self-pay | Admitting: Internal Medicine

## 2017-08-20 DIAGNOSIS — K529 Noninfective gastroenteritis and colitis, unspecified: Secondary | ICD-10-CM | POA: Diagnosis present

## 2017-08-20 DIAGNOSIS — I10 Essential (primary) hypertension: Secondary | ICD-10-CM | POA: Diagnosis present

## 2017-08-20 DIAGNOSIS — K589 Irritable bowel syndrome without diarrhea: Secondary | ICD-10-CM | POA: Diagnosis present

## 2017-08-20 DIAGNOSIS — E46 Unspecified protein-calorie malnutrition: Secondary | ICD-10-CM

## 2017-08-20 DIAGNOSIS — F1011 Alcohol abuse, in remission: Secondary | ICD-10-CM | POA: Insufficient documentation

## 2017-08-20 LAB — BASIC METABOLIC PANEL
Anion gap: 8 (ref 5–15)
Anion gap: 10 (ref 5–15)
Anion gap: 10 (ref 5–15)
Anion gap: 11 (ref 5–15)
Anion gap: 10 (ref 5–15)
Anion gap: 11 (ref 5–15)
BUN: <5 mg/dL — ABNORMAL LOW (ref 6–20)
BUN: <5 mg/dL — ABNORMAL LOW (ref 6–20)
BUN: <5 mg/dL — ABNORMAL LOW (ref 6–20)
BUN: <5 mg/dL — ABNORMAL LOW (ref 6–20)
BUN: <5 mg/dL — ABNORMAL LOW (ref 6–20)
BUN: <5 mg/dL — ABNORMAL LOW (ref 6–20)
CO2: 24 mmol/L (ref 22–32)
CO2: 24 mmol/L (ref 22–32)
CO2: 25 mmol/L (ref 22–32)
CO2: 22 mmol/L (ref 22–32)
CO2: 23 mmol/L (ref 22–32)
CO2: 23 mmol/L (ref 22–32)
Calcium: 8.6 mg/dL — ABNORMAL LOW (ref 8.9–10.3)
Calcium: 8.4 mg/dL — ABNORMAL LOW (ref 8.9–10.3)
Calcium: 8.6 mg/dL — ABNORMAL LOW (ref 8.9–10.3)
Calcium: 8.8 mg/dL — ABNORMAL LOW (ref 8.9–10.3)
Calcium: 8.7 mg/dL — ABNORMAL LOW (ref 8.9–10.3)
Calcium: 8.6 mg/dL — ABNORMAL LOW (ref 8.9–10.3)
Chloride: 90 mmol/L — ABNORMAL LOW (ref 101–111)
Chloride: 88 mmol/L — ABNORMAL LOW (ref 101–111)
Chloride: 88 mmol/L — ABNORMAL LOW (ref 101–111)
Chloride: 91 mmol/L — ABNORMAL LOW (ref 101–111)
Chloride: 90 mmol/L — ABNORMAL LOW (ref 101–111)
Chloride: 89 mmol/L — ABNORMAL LOW (ref 101–111)
Creatinine, Ser: 0.64 mg/dL (ref 0.61–1.24)
Creatinine, Ser: 0.64 mg/dL (ref 0.61–1.24)
Creatinine, Ser: 0.74 mg/dL (ref 0.61–1.24)
Creatinine, Ser: 0.76 mg/dL (ref 0.61–1.24)
Creatinine, Ser: 0.66 mg/dL (ref 0.61–1.24)
Creatinine, Ser: 0.79 mg/dL (ref 0.61–1.24)
GFR calc Af Amer: >60 mL/min (ref >60)
GFR calc Af Amer: >60 mL/min (ref >60)
GFR calc Af Amer: >60 mL/min (ref >60)
GFR calc Af Amer: >60 mL/min (ref >60)
GFR calc Af Amer: >60 mL/min (ref >60)
GFR calc Af Amer: >60 mL/min (ref >60)
GFR calc non Af Amer: >60 mL/min (ref >60)
GFR calc non Af Amer: >60 mL/min (ref >60)
GFR calc non Af Amer: >60 mL/min (ref >60)
GFR calc non Af Amer: >60 mL/min (ref >60)
GFR calc non Af Amer: >60 mL/min (ref >60)
GFR calc non Af Amer: >60 mL/min (ref >60)
Glucose, Bld: 89 mg/dL (ref 65–99)
Glucose, Bld: 113 mg/dL — ABNORMAL HIGH (ref 65–99)
Glucose, Bld: 85 mg/dL (ref 65–99)
Glucose, Bld: 165 mg/dL — ABNORMAL HIGH (ref 65–99)
Glucose, Bld: 104 mg/dL — ABNORMAL HIGH (ref 65–99)
Glucose, Bld: 104 mg/dL — ABNORMAL HIGH (ref 65–99)
Potassium: 3.4 mmol/L — ABNORMAL LOW (ref 3.5–5.1)
Potassium: 3.4 mmol/L — ABNORMAL LOW (ref 3.5–5.1)
Potassium: 3.9 mmol/L (ref 3.5–5.1)
Potassium: 3.9 mmol/L (ref 3.5–5.1)
Potassium: 4 mmol/L (ref 3.5–5.1)
Potassium: 3.8 mmol/L (ref 3.5–5.1)
Sodium: 122 mmol/L — ABNORMAL LOW (ref 135–145)
Sodium: 122 mmol/L — ABNORMAL LOW (ref 135–145)
Sodium: 123 mmol/L — ABNORMAL LOW (ref 135–145)
Sodium: 124 mmol/L — ABNORMAL LOW (ref 135–145)
Sodium: 123 mmol/L — ABNORMAL LOW (ref 135–145)
Sodium: 123 mmol/L — ABNORMAL LOW (ref 135–145)

## 2017-08-20 LAB — CORTISOL: Cortisol, Plasma: 15.1 ug/dL

## 2017-08-20 MED ORDER — POTASSIUM CHLORIDE CRYS ER 20 MEQ PO TBCR
40.0000 meq | EXTENDED_RELEASE_TABLET | Freq: Once | ORAL | Status: AC
  Administered 2017-08-20: 40 meq via ORAL
  Filled 2017-08-20: qty 2

## 2017-08-20 MED ORDER — ALPRAZOLAM 0.5 MG PO TABS
0.5000 mg | ORAL_TABLET | Freq: Three times a day (TID) | ORAL | Status: DC
  Administered 2017-08-20 – 2017-08-21 (×4): 0.5 mg via ORAL
  Filled 2017-08-20 (×4): qty 1

## 2017-08-20 MED ORDER — DEXTROSE 5 % IV SOLN
INTRAVENOUS | Status: DC
  Administered 2017-08-20: 05:00:00 via INTRAVENOUS

## 2017-08-20 NOTE — Progress Notes (Signed)
   Subjective:  No acute events overnight. Patient states that he is feeling better this morning, and his symptoms of HA, generalized weakness, and fatigue have resolved. He is complaining of a lot of anxiety while hospitalized, and he wants to leave today.   Discussed with the patient that it would be unsafe to leave today. His sodium has improved, but his sodium is still dangerously low. He hesitantly agrees to stay today, but is adamant about leaving tomorrow. Otherwise, no acute complaints.   Objective:  Vital signs in last 24 hours: Vitals:   08/19/17 1600 08/19/17 1919 08/19/17 2322 08/20/17 0506  BP:  (!) 164/82 116/70 115/64  Pulse:  74 (!) 59 67  Resp:  18 19 17   Temp:  98.6 F (37 C) 98.5 F (36.9 C) 98.3 F (36.8 C)  TempSrc:  Oral Oral Oral  SpO2:  96% 95% 97%  Weight: 186 lb 8.2 oz (84.6 kg)     Height: 5' 11"  (1.803 m)      General: Laying in bed comfortably, NAD HEENT: [place]/AT, EOMI, no scleral icterus, PERRL Cardiac: RRR, No R/M/G appreciated Pulm: normal effort, CTAB Abd: soft, non tender, non distended, BS normal Ext: extremities well perfused, no peripheral edema Neuro: alert and oriented X3, cranial nerves II-XII grossly intact   Assessment/Plan:  Principal Problem:   Hyponatremia Active Problems:   Ankylosing spondylitis ([place])  Hyponatremia Patient presenting with headaches, generalized fatigue, leg cramps, and weakness, found to have a sodium level of 115 consistent with symptomatic with hyponatremia. Patient is euvolemic on examination, no history of CHF or cirrhosis; hepatic function panel within normal limits. No focal neurologic deficits, patient is alert and oriented x 3. 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 Serum osm 248, urine Na 41, urine osm low 200, not entirely consistent with SIADH. He is also having large volume urine output ~3 liters/24 hours despite fluid restriction, also not entirely consistent wiih  SIADH. The patient's  Sodium level correcting within goal, D5W started last night to prevent over correction, has since been stopped. Most recent Na 123.  -Fluid restriction - 1500 mL daily -Goal of correction, 4-6 meq/ 24 hours; no more than 8 meq in 24 hours - goal for Na level for tomorrow morning BMET 130  -If over corrects - will give D5W @ 150 cc/hr -Q4 BMETs -holding lisinopril-HCTZ -Strict I/Os  ETOH Use Patient denies previous withdrawals or withdrawal seizures. Per patient drinks 3-4 beers daily. Has required 2 mg ativan.  -CIWA protocol with Ativan   HTN Hypertensive,  BP 155/87.  -Holding Lisinopril-HCTZ -Continue Verapamil 240 mg daily   GERD Continue protonix 40 mg daily -Continue Carafate TID WC PRN  Anxiety Patient very anxious this morning. Contributes his anxiety to being the hospital. Patient takes xanax 0.5 mg TID, confirmed on [place] [facility]. -Will continue xanax 0.5 mg TID, have scheduled doses given patient's increased anxiety  Code Status: DNR/DNI, confirmed on admission DVT ppx: Lovenox Diet:  HH/fluid restriction, 1500 mL    Dispo: Anticipated discharge in approximately 1-2 day(s).   Melanee Spry, MD 08/20/2017, 6:50 AM Pager: [phone]

## 2017-08-20 NOTE — Progress Notes (Signed)
Subjective: Anthony Lamb is a 62 yo male with PMH of HTN, GERD, anxiety, UC, HLD, ETOH use disorder who presented to the Ed on 3/17 with severe headaches, generalized weakness, leg cramps,  fatigue, and poor PO intake for the past several days. Patient reports that he is feeling much better this morning. He does not endorse any] feelings of dizziness, fatigue, headaches, or blurry vision. No overnight events. He reports no acute complaints. He reports feeling very anxious this morning and expressed his desire to leave the hospital today. The team explained to the patient that it would not be safe for him to be discharged today as his sodium levels are still below his baseline. He expressed understanding.    Objective: Vital signs in last 24 hours: Vitals:   08/19/17 2322 08/20/17 0506 08/20/17 0742 08/20/17 1104  BP: 116/70 115/64 137/79 (!) 155/87  Pulse: (!) 59 67 71   Resp: 19 17 (!) 25 16  Temp: 98.5 F (36.9 C) 98.3 F (36.8 C) 98.5 F (36.9 C) 98.6 F (37 C)  TempSrc: Oral Oral Oral Oral  SpO2: 95% 97% 96% 95%  Weight:      Height:       Weight change:   Intake/Output Summary (Last 24 hours) at 08/20/2017 1144 Last data filed at 08/20/2017 0600 Gross per 24 hour  Intake 835 ml  Output 3000 ml  Net -2165 ml   General appearance: alert, cooperative and no distress Head: Normocephalic, without obvious abnormality, atraumatic Eyes: conjunctivae/corneas clear. PERRL, EOM's intact. Fundi benign. Ears: normal TM's and external ear canals both ears Nose: Nares normal. Septum midline. Mucosa normal. No drainage or sinus tenderness. Throat: lips, mucosa, and tongue normal; teeth and gums normal Lungs: clear to auscultation bilaterally Heart: regular rate and rhythm, S1, S2 normal, no murmur, click, rub or gallop Abdomen: soft, non-tender; bowel sounds normal; no masses,  no organomegaly Extremities: extremities normal, atraumatic, no cyanosis or edema Skin: Skin color, texture,  turgor normal. No rashes or lesions Neurologic: Alert and oriented X 3, normal strength and tone. Normal symmetric reflexes. Normal coordination and gait Lab Results: @LABTEST2 @ Micro Results: Recent Results (from the past 240 hour(s))  MRSA PCR Screening     Status: None   Collection Time: 08/19/17  4:28 PM  Result Value Ref Range Status   MRSA by PCR NEGATIVE NEGATIVE Final    Comment:        The GeneXpert MRSA Assay (FDA approved for NASAL specimens only), is one component of a comprehensive MRSA colonization surveillance program. It is not intended to diagnose MRSA infection nor to guide or monitor treatment for MRSA infections. Performed at [facility], [place] [address], [place], [place] [address]    Studies/Results: Ct Head Wo Contrast  Result Date: 08/19/2017 CLINICAL DATA:  c.o. Several days of headache, generalized weakness and muscle cramping. Pt scanned left side. up EXAM: CT HEAD WITHOUT CONTRAST TECHNIQUE: Contiguous axial images were obtained from the base of the skull through the vertex without intravenous contrast. COMPARISON:  08/23/2016 FINDINGS: Brain: No acute intracranial hemorrhage. No focal mass lesion. No CT evidence of acute infarction. No midline shift or mass effect. No hydrocephalus. Basilar cisterns are patent. Vascular: No hyperdense vessel or unexpected calcification. Skull: Normal. Negative for fracture or focal lesion. Sinuses/Orbits: Mild mucosal inflammation in the sphenoid sinus. Mild mucosal thickening in the LEFT mastoid air cells. Other: None. IMPRESSION: No acute intracranial findings.  Trace sinus inflammation. Electronically Signed   By: Nicole Kindred  Leonia Reeves M.D.   On: 08/19/2017 11:35   Medications: I have reviewed the patient's current medications. Scheduled Meds: . ALPRAZolam  0.5 mg Oral TID  . enoxaparin (LOVENOX) injection  40 mg Subcutaneous Q24H  . folic acid  1 mg Oral Daily  . lidocaine  2 patch Transdermal Q24H  . pantoprazole   40 mg Oral Daily  . sodium chloride flush  3 mL Intravenous Q12H  . sucralfate  1 g Oral TID WC & HS  . thiamine  100 mg Oral Daily  . verapamil  240 mg Oral QHS   Continuous Infusions: PRN Meds:.acetaminophen **OR** acetaminophen, LORazepam, ondansetron **OR** ondansetron (ZOFRAN) IV, polyethylene glycol, sodium chloride, zolpidem  Assessment & Plan by Problem:  Hyponatremia:   Upon admission to the ED, patient presented with generalized fatigue, weakness, headaches, anorexia and was found to have a serum sodium of 119 and  plasma osmolality of 248. Physical exam and lab findings are most consistent with an euvolemic hyponatremialikely secondary to patient's known SIADH.  -Continue fluid restriction to no more than 1500 mL/day -Close monitoring in step-down unit for over-correction. Correction goal of 4-6 mEQ/24 hours. No more than 8 mEQ/24 hours. -Give patient D5W @ 150 cc/hr if serum sddium over-corrects -Strict I/Os -BMETq4h  HTN Patient's BP upon admission to the ED was 171/82.  -Continue holding Lisinopril-HCTZ due to possibility of worsening hyponatremia -Continue Verapamil 240 mg daily  ETOH Use Patient reported drinking 3-4 light beers/day Patient denied any previous history of withdrawals or withdrawal seizures.  -CIWA protocol with Ativan  Anxiety -Patient currently on xanax 0.5 mg TID. Continue xanax 0.5 mg TID PRN   GERD -Continue protonix 40 mg daily  This is a Careers information officer Note.  The care of the patient was discussed with Dr. Aggie Hacker and the assessment and plan formulated with their assistance.  Please see their attached note for official documentation of the daily encounter.   LOS: 1 day   Jenell Milliner, Medical Student 08/20/2017, 11:44 AM

## 2017-08-21 DIAGNOSIS — M549 Dorsalgia, unspecified: Secondary | ICD-10-CM

## 2017-08-21 DIAGNOSIS — K580 Irritable bowel syndrome with diarrhea: Secondary | ICD-10-CM

## 2017-08-21 DIAGNOSIS — Z598 Other problems related to housing and economic circumstances: Secondary | ICD-10-CM

## 2017-08-21 DIAGNOSIS — M542 Cervicalgia: Secondary | ICD-10-CM

## 2017-08-21 LAB — BASIC METABOLIC PANEL
Anion gap: 10 (ref 5–15)
Anion gap: 11 (ref 5–15)
BUN: <5 mg/dL — ABNORMAL LOW (ref 6–20)
BUN: <5 mg/dL — ABNORMAL LOW (ref 6–20)
CO2: 26 mmol/L (ref 22–32)
CO2: 27 mmol/L (ref 22–32)
Calcium: 8.9 mg/dL (ref 8.9–10.3)
Calcium: 9.1 mg/dL (ref 8.9–10.3)
Chloride: 90 mmol/L — ABNORMAL LOW (ref 101–111)
Chloride: 90 mmol/L — ABNORMAL LOW (ref 101–111)
Creatinine, Ser: 0.76 mg/dL (ref 0.61–1.24)
Creatinine, Ser: 0.8 mg/dL (ref 0.61–1.24)
GFR calc Af Amer: >60 mL/min (ref >60)
GFR calc Af Amer: >60 mL/min (ref >60)
GFR calc non Af Amer: >60 mL/min (ref >60)
GFR calc non Af Amer: >60 mL/min (ref >60)
Glucose, Bld: 86 mg/dL (ref 65–99)
Glucose, Bld: 110 mg/dL — ABNORMAL HIGH (ref 65–99)
Potassium: 3.8 mmol/L (ref 3.5–5.1)
Potassium: 4.2 mmol/L (ref 3.5–5.1)
Sodium: 126 mmol/L — ABNORMAL LOW (ref 135–145)
Sodium: 128 mmol/L — ABNORMAL LOW (ref 135–145)

## 2017-08-21 LAB — ACTH: C206 ACTH: 14.3 pg/mL (ref 7.2–63.3)

## 2017-08-21 MED ORDER — LISINOPRIL 10 MG PO TABS: 10.0000 mg | ORAL_TABLET | Freq: Every day | ORAL | 0 refills | Status: DC

## 2017-08-21 NOTE — Clinical Social Work Note (Signed)
Clinical Social Work Assessment  Patient Details  Name: CARLTON BUSKEY MRN: [id number] Date of Birth: 08/13/1955  Date of referral:  08/21/17               Reason for consult:  Transportation                Permission sought to share information with:    Permission granted to share information::  No  Name::        Agency::     Relationship::     Contact Information:     Housing/Transportation Living arrangements for the past 2 months:  Apartment Source of Information:  Patient, Medical Team Patient Interpreter Needed:  None Criminal Activity/Legal Involvement Pertinent to Current Situation/Hospitalization:  No - Comment as needed Significant Relationships:  Friend, Neighbor Lives with:  Self Do you feel safe going back to the place where you live?  Yes Need for family participation in patient care:  Yes (Comment)  Care giving concerns:  Transportation issues.   Social Worker assessment / plan:  CSW met with patient. No supports at bedside. CSW introduced role and inquired about transportation needs. Patient has difficulty getting to appointments but says he has a friend that can now drive him to appointments as needed. Patient asked about SCAT but, per RN, patient gets around well. CSW explained that patient that SCAT is typically for disabled individuals. Patient stated he has back pain. CSW encouraged patient to go to DSS to sign up for Medicaid transportation. Patient also requesting a cab voucher due to back pain. He cannot pay for it and has no one to pick him up. Address confirmed and cab voucher filled out and given to RN. No further concerns. CSW signing off as patient is discharging home today.   Employment status:  Disabled (Comment on whether or not currently receiving Disability) Patient is receiving disability. Insurance information:  Medicaid [place] PT Recommendations:  Not assessed at this time Information / Referral to community resources:  Other (Comment  Required)(Medicaid transportation, Cab voucher.)  Patient/Family's Response to care:  Patient agreeable to speaking with CSW about transportation issues. Patient's friends supportive and involved in patient's care. Patient appreciated social work intervention.  Patient/Family's Understanding of and Emotional Response to Diagnosis, Current Treatment, and Prognosis:  Patient has a good understanding of the reason for admission and social work consult. Patient appears happy with hospital care.  Emotional Assessment Appearance:  Appears stated age Attitude/Demeanor/Rapport:  Engaged, Gracious Affect (typically observed):  Accepting, Appropriate, Calm, Pleasant Orientation:  Oriented to Self, Oriented to Place, Oriented to  Time, Oriented to Situation Alcohol / Substance use:  Alcohol Use Psych involvement (Current and /or in the community):  No (Comment)  Discharge Needs  Concerns to be addressed:  No discharge needs identified Readmission within the last 30 days:  No Current discharge risk:  None Barriers to Discharge:  No Barriers Identified   Candie Chroman, LCSW 08/21/2017, 11:40 AM

## 2017-08-21 NOTE — Progress Notes (Signed)
Subjective:  No acute events overnight. Patient states that he is feeling better this morning, all symptoms have resolved. He wants to leave today.   Recommend that he stays one more day to watch his sodium level. Dicussed risks of low sodium levels with the patient including seizure and recurrence of his symptoms. Discussed with patient that he should restrict fluid intake to 1 liter daily. Also recommend that the patient stop drinking beer completely. He is willing to decrease alcohol intake to 1-2 beers daily. Also recommended adding salt to his meals.   Patient states he has difficulty with transportation due to funds and inability to walk to and from the bus stop (due to back and neck pain). He saw his PCP last week and we discussed importance of follow up with his PCP. Discussed getting social work to come by and see him to help with these issues.   Objective:  Vital signs in last 24 hours: Vitals:   08/20/17 1916 08/20/17 2306 08/21/17 0324 08/21/17 0700  BP: (!) 146/82 130/71 108/70 (!) 146/82  Pulse: 81 63 66 64  Resp: 15 15 19 20   Temp: 98.2 F (36.8 C) 98.7 F (37.1 C) 97.6 F (36.4 C) 98.2 F (36.8 C)  TempSrc: Oral Oral Oral Oral  SpO2: 96% 98% 97% 98%  Weight:      Height:       General: Laying in bed comfortably, NAD HEENT: [place]/AT, EOMI, no scleral icterus, PERRL Cardiac: RRR, No R/M/G appreciated Pulm: normal effort, CTAB Abd: soft, non tender, non distended, BS normal Ext: extremities well perfused, no peripheral edema Neuro: alert and oriented X3, cranial nerves II-XII grossly intact   Assessment/Plan:  Principal Problem:   Hyponatremia Active Problems:   Ankylosing spondylitis ([facility])   Alcohol abuse   IBS (irritable bowel syndrome)   Hypertension   Colitis  Hyponatremia Patient presenting with headaches, generalized fatigue, leg cramps, and weakness, found to have a sodium level of 115 consistent with symptomatic  hyponatremia. 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 Serum osm 248, urine Na 41, urine osm low 200, not entirely consistent with SIADH. Sodium level correcting within goal with fluid restriction, most recent level 128. Would have liked to monitor patient's sodium for one more day, but he is adamant about discharge today. Dicussed risks of low sodium levels including seizures and coma. Discussed the importance of close follow up with PCP, Dr. Nancy Fetter.   -Fluid restriction - Advised Patient to restrict fluid intake to 1 liter of fluid daily -Na level within goal of correction this AM - 128 -Will discontinue HCTZ-lisinopril combination pill -Will continue Lisinopril 10 mg only at discharge  -Social work consulted  ETOH Use Patient denies previous withdrawals or withdrawal seizures. Per patient drinks 3-4 beers daily. Discussed with patient that beer can contribute to low sodium. Advised to stop drinking. Discussed with patient the goal of 1-2 beers daily is reasonable and he is agreeable.  -will benefit from continued counseling with PCP  HTN Hypertensive this morning,  BP 155/87.  -Holding Lisinopril-HCTZ; will d/c combo pill and start lisinopril 10 mg only at discharge -Continue Verapamil 240 mg daily   GERD Continue protonix 40 mg daily -Continue Carafate TID WC PRN  Anxiety Less anxious this morning. -continue xanax 0.5 mg TID at discharge  Code Status: DNR/DNI, confirmed on admission DVT ppx: Lovenox Diet:  HH/fluid restriction, 1500 mL    Dispo: Anticipated discharge today.   Melanee Spry, MD 08/21/2017,  10:40 AM Pager: [phone]

## 2017-08-21 NOTE — Discharge Summary (Signed)
Name: NORTON BIVINS MRN: [id number] DOB: 1956-01-18 62 y.o. PCP: Sandi Mariscal, MD  Date of Admission: 08/19/2017  8:28 AM Date of Discharge: 08/21/2017 Attending Physician: Lenice Pressman, MD   Discharge Diagnosis: 1. Symptomatic Hyponatremia Principal Problem:   Hyponatremia Active Problems:   Ankylosing spondylitis (HCC)   Alcohol abuse   IBS (irritable bowel syndrome)   Hypertension   Colitis   Discharge Medications: Allergies as of 08/21/2017      Reactions   Diphenhydramine Hcl Other (See Comments)   Restlessness   Flexeril [cyclobenzaprine] Other (See Comments)   Restlessness   Nsaids Other (See Comments)   GI upset      Medication List    STOP taking these medications   lisinopril-hydrochlorothiazide 10-12.5 MG tablet Commonly known as:  PRINZIDE,ZESTORETIC   sodium chloride 1 g tablet     TAKE these medications   acetaminophen 500 MG tablet Commonly known as:  TYLENOL Take 1,000 mg by mouth every 6 (six) hours as needed (teeth pain).   ferrous sulfate 325 (65 FE) MG tablet Take 325 mg by mouth daily with breakfast.   folic acid 1 MG tablet Commonly known as:  FOLVITE Take 1 tablet (1 mg total) by mouth daily.   hydrOXYzine 25 MG tablet Commonly known as:  ATARAX/VISTARIL Take 1 tablet (25 mg total) by mouth every 6 (six) hours as needed for anxiety.   lisinopril 10 MG tablet Commonly known as:  PRINIVIL,ZESTRIL Take 1 tablet (10 mg total) by mouth daily.   LORazepam 0.5 MG tablet Commonly known as:  ATIVAN Take 0.5 mg by mouth 3 (three) times daily as needed for anxiety.   MENS ONE DAILY PO Take 1 tablet by mouth daily.   pantoprazole 40 MG tablet Commonly known as:  PROTONIX Take 1 tablet (40 mg total) by mouth daily. What changed:  when to take this   sodium chloride 0.65 % Soln nasal spray Commonly known as:  OCEAN Place 1 spray into both nostrils as needed for congestion.   sucralfate 1 g tablet Commonly known as:   CARAFATE Take 1 tablet (1 g total) by mouth 4 (four) times daily -  with meals and at bedtime. Chew before swallowing What changed:    when to take this  additional instructions   verapamil 240 MG CR tablet Commonly known as:  CALAN-SR Take 240 mg by mouth at bedtime.   Vitamin D 2000 units Caps Take 4,000 Units by mouth daily.   zolpidem 10 MG tablet Commonly known as:  AMBIEN Take 1 tablet (10 mg total) by mouth at bedtime as needed for sleep.       Disposition and follow-up:   Mr.Wesam C Missey was discharged from [facility] in Stable condition.  At the hospital follow up visit please address:  1.   Symptomatic Hyponatremia -Patient presented to [facility] with serum Na+ of 115, he was fluid restricted and his sodium corrected at an appropriate rate.  -At discharge his serum Na+ was 128 -It was recommended the patient stay an additional day to continue to monitor his serum sodium, he declined this recommendation and persisted on being discharged -Please repeat a basic metabolic panel at follow up to evaluate sodium level -Patient was advised to fluid restrict to no more than 1 liter of fluids daily, please assess if patient is adhering to fluid restriction  -Patient advised to stop ETOH intake, patient agreeable to decreasing beer intake from 3-4 beers daily to 1-2 daily; please  continue to counsel patient about alcohol cessation  -Patient may benefit from nephrology referral as an outpatient as his hyponatremia is chronic and is not entirely consistent with SIADH  ETOH use -Patient states he drinks 3-4 beers daily -Placed on CIWA protocol with ativan while hospitalized -Continue to counsel patient on alcohol cessation   Hypertension -HCTZ-Lisinopril was held during admission and was discontinued at discharge -Verapamil 240 mg was resumed during hospitalization -Lisinopril 10 mg was started at discharge   Anxiety -Patient was very anxious the duration  of his hospitalization -His anxiety was managed with his home Xanax dose of 0.5 mg TID  Ulcerative Colitis -Symptoms stable -Most recent colonoscopy in 2010 and given IBD history he is likely due for screening colonoscopy, please discuss this with the patient at follow up    2.  Labs / imaging needed at time of follow-up: Basic Metabolic panel  3.  Pending labs/ test needing follow-up: none   Follow-up Appointments: Follow-up Information    Sandi Mariscal, MD.   Specialty:  Internal Medicine Contact information: [place] [address] [place] Hospital Course by problem list: Principal Problem:   Hyponatremia Active Problems:   Ankylosing spondylitis (HCC)   Alcohol abuse   IBS (irritable bowel syndrome)   Hypertension   Colitis   1.  Symptomatic Hyponatremia Mr. Batson was admitted to [facility] and the Internal Medicine Teaching Service for symptomatic hyponatremia with an initial serum sodium of 115. Upon arrival to [facility], the patient was hypertensive but otherwise hemodynamically stable. He was alert and oriented, with no focal neurological deficits, and was euvolemic on examination. His chief complaint included headache and generalized weakness. A CT scan of the head was performed in the ED, which was negative for acute intracranial abnormality. He was fluid restricted to less than 1500 mL per day. His sodium was monitored closely with BMETs every four hours to avoid overcorrection. His symptoms improved quickly with fluid restriction and normalization of his serum sodium. On day of discharge, the patient's serum sodium level was 128, which is near the patient's chronically low serum sodium levels. It was recommended the patient stay an additional day to continue to monitor his sodium level, but he declined this recommendation and was adamant about being discharged home. Patient was advised to fluid restrict to no more than 1  liter of fluids daily, to stop drinking beer, and to follow up with his primary care doctor within 1-2 weeks. A basic metabolic panel should be repeated at follow up to evaluate sodium level. Mr. Tamburo may also benefit from a nephrology referral as an outpatient as his hyponatremia is chronic and is not entirely consistent with SIADH. Called patient's PCP Dr. Nancy Fetter, and spoke with him directly about patient's reason for admission, hospital course, and need for close follow up.   ETOH use Mr. Mousel drinks 3-4 beers daily, which may also be contributing to his hyponatremia. The patient was counseled on alcohol cessation with a goal to decrease beer intake to 1-2 beers daily with a goal of complete alcohol cessation. Please continue to counsel a patient on alcohol cessation.  Hypertension Blood pressure was elevated on admission, improved after restarting patient's home dose of verapamil. HCTZ-Lisinopril was held during admission and was discontinued at discharge. Lisinopril 10 mg was started at discharge.   Anxiety Patient was very anxious the duration of his hospitalization His anxiety was managed with his home Xanax  dose of 0.5 mg TID.    Discharge Vitals:   BP (!) 146/82 (BP Location: Right Arm)   Pulse 64   Temp 98.2 F (36.8 C) (Oral)   Resp 20   Ht 5' 11"  (1.803 m)   Wt 186 lb 8.2 oz (84.6 kg)   SpO2 98%   BMI 26.01 kg/m   Pertinent Labs, Studies, and Procedures:  BMP Latest Ref Rng & Units 08/21/2017 08/21/2017 08/20/2017  Glucose 65 - 99 mg/dL 110(H) 86 104(H)  BUN 6 - 20 mg/dL <5(L) <5(L) <5(L)  Creatinine 0.61 - 1.24 mg/dL 0.80 0.76 0.79  Sodium 135 - 145 mmol/L 128(L) 126(L) 123(L)  Potassium 3.5 - 5.1 mmol/L 4.2 3.8 3.8  Chloride 101 - 111 mmol/L 90(L) 90(L) 89(L)  CO2 22 - 32 mmol/L 27 26 23   Calcium 8.9 - 10.3 mg/dL 9.1 8.9 8.6(L)   CT Head  IMPRESSION: No acute intracranial findings. Trace sinus inflammation.  Discharge Instructions: Discharge Instructions    Call  MD for:  extreme fatigue   Complete by:  As directed    Call MD for:  persistant dizziness or light-headedness   Complete by:  As directed    Call MD for:  persistant nausea and vomiting   Complete by:  As directed    Diet - low sodium heart healthy   Complete by:  As directed    Discharge instructions   Complete by:  As directed    Mr. Kebron, Pulse were hospitalized for low sodium levels. On arrival to the hospital your sodium was very low and likely causing your symptoms. We slowly increased your sodium level with restricting your fluid intake. Because this is an ongoing problem for you we have several recommendations to try and keep your sodium level normal: -Do NOT drink more than 1 LITER of fluids total daily; 1 liter is equal to 33 fluid ounces daily  -Refrain from drinking alcohol. If you do drink beer do not drink more than 1-2 beers daily.  -Add additional salt to your meals  -STOP taking HCTZ-Lisinopril for your blood pressure. The HCTZ component of that medication caused you to loose sodium.  -START taking Lisinopril 10 mg daily, I have sent this to your pharmacy   Please follow up with your primary care doctor within one week of discharge from the hospital.   Continue taking all other medications as prescribed.   Increase activity slowly   Complete by:  As directed       Signed: Melanee Spry, MD 08/21/2017, 2:00 PM   Pager: [phone]

## 2017-08-31 ENCOUNTER — Encounter (HOSPITAL_COMMUNITY): Payer: Self-pay

## 2017-08-31 ENCOUNTER — Emergency Department (HOSPITAL_COMMUNITY)
Admission: EM | Admit: 2017-08-31 | Discharge: 2017-08-31 | Disposition: A | Discharge status: Home / Self Care | Payer: Medicaid Other | Attending: Emergency Medicine | Admitting: Emergency Medicine

## 2017-08-31 ENCOUNTER — Other Ambulatory Visit: Payer: Self-pay

## 2017-08-31 DIAGNOSIS — R531 Weakness: Secondary | ICD-10-CM | POA: Diagnosis not present

## 2017-08-31 DIAGNOSIS — R197 Diarrhea, unspecified: Secondary | ICD-10-CM | POA: Insufficient documentation

## 2017-08-31 DIAGNOSIS — R110 Nausea: Secondary | ICD-10-CM | POA: Insufficient documentation

## 2017-08-31 DIAGNOSIS — Z87891 Personal history of nicotine dependence: Secondary | ICD-10-CM | POA: Diagnosis not present

## 2017-08-31 DIAGNOSIS — I10 Essential (primary) hypertension: Secondary | ICD-10-CM | POA: Diagnosis not present

## 2017-08-31 DIAGNOSIS — R5383 Other fatigue: Secondary | ICD-10-CM | POA: Diagnosis present

## 2017-08-31 DIAGNOSIS — R630 Anorexia: Secondary | ICD-10-CM | POA: Diagnosis not present

## 2017-08-31 DIAGNOSIS — R51 Headache: Secondary | ICD-10-CM | POA: Insufficient documentation

## 2017-08-31 DIAGNOSIS — F419 Anxiety disorder, unspecified: Secondary | ICD-10-CM | POA: Insufficient documentation

## 2017-08-31 DIAGNOSIS — R5382 Chronic fatigue, unspecified: Secondary | ICD-10-CM | POA: Diagnosis not present

## 2017-08-31 DIAGNOSIS — Z79899 Other long term (current) drug therapy: Secondary | ICD-10-CM | POA: Diagnosis not present

## 2017-08-31 LAB — RAPID URINE DRUG SCREEN, HOSP PERFORMED
AMPHETAMINES: NOT DETECTED
BARBITURATES: NOT DETECTED
BENZODIAZEPINES: POSITIVE — AB
Cocaine: NOT DETECTED
Opiates: NOT DETECTED
Tetrahydrocannabinol: NOT DETECTED

## 2017-08-31 LAB — URINALYSIS, ROUTINE W REFLEX MICROSCOPIC
Bilirubin Urine: NEGATIVE
GLUCOSE, UA: NEGATIVE mg/dL
Ketones, ur: NEGATIVE mg/dL
Leukocytes, UA: NEGATIVE
Nitrite: NEGATIVE
Protein, ur: NEGATIVE mg/dL
SPECIFIC GRAVITY, URINE: 1.003 — AB (ref 1.005–1.030)
Squamous Epithelial / LPF: NONE SEEN
pH: 7 (ref 5.0–8.0)

## 2017-08-31 LAB — CBC WITH DIFFERENTIAL/PLATELET
BASOS ABS: 0.1 10*3/uL (ref 0.0–0.1)
Basophils Relative: 1 %
Eosinophils Absolute: 0.1 10*3/uL (ref 0.0–0.7)
Eosinophils Relative: 1 %
HEMATOCRIT: 43.7 % (ref 39.0–52.0)
Hemoglobin: 15.2 g/dL (ref 13.0–17.0)
LYMPHS PCT: 9 %
Lymphs Abs: 1 10*3/uL (ref 0.7–4.0)
MCH: 32.5 pg (ref 26.0–34.0)
MCHC: 34.8 g/dL (ref 30.0–36.0)
MCV: 93.4 fL (ref 78.0–100.0)
MONO ABS: 0.9 10*3/uL (ref 0.1–1.0)
MONOS PCT: 9 %
Neutro Abs: 8.2 10*3/uL — ABNORMAL HIGH (ref 1.7–7.7)
Neutrophils Relative %: 80 %
Platelets: 349 10*3/uL (ref 150–400)
RBC: 4.68 MIL/uL (ref 4.22–5.81)
RDW: 12.6 % (ref 11.5–15.5)
WBC: 10.2 10*3/uL (ref 4.0–10.5)

## 2017-08-31 LAB — COMPREHENSIVE METABOLIC PANEL
ALT: 22 U/L (ref 17–63)
AST: 24 U/L (ref 15–41)
Albumin: 3.6 g/dL (ref 3.5–5.0)
Alkaline Phosphatase: 78 U/L (ref 38–126)
Anion gap: 10 (ref 5–15)
CO2: 28 mmol/L (ref 22–32)
CREATININE: 0.75 mg/dL (ref 0.61–1.24)
Calcium: 8.8 mg/dL — ABNORMAL LOW (ref 8.9–10.3)
Chloride: 95 mmol/L — ABNORMAL LOW (ref 101–111)
GFR calc Af Amer: >60 mL/min (ref >60)
Glucose, Bld: 92 mg/dL (ref 65–99)
POTASSIUM: 3.6 mmol/L (ref 3.5–5.1)
Sodium: 133 mmol/L — ABNORMAL LOW (ref 135–145)
Total Bilirubin: 0.3 mg/dL (ref 0.3–1.2)
Total Protein: 6.8 g/dL (ref 6.5–8.1)

## 2017-08-31 LAB — ETHANOL: Alcohol, Ethyl (B): <10 mg/dL (ref <10)

## 2017-08-31 MED ORDER — LORAZEPAM 0.5 MG PO TABS
0.5000 mg | ORAL_TABLET | Freq: Once | ORAL | Status: AC
  Administered 2017-08-31: 0.5 mg via ORAL
  Filled 2017-08-31: qty 1

## 2017-08-31 MED ORDER — ONDANSETRON 4 MG PO TBDP
4.0000 mg | ORAL_TABLET | Freq: Once | ORAL | Status: AC
  Administered 2017-08-31: 4 mg via ORAL
  Filled 2017-08-31: qty 1

## 2017-08-31 MED ORDER — SODIUM CHLORIDE 0.9 % IV SOLN
INTRAVENOUS | Status: DC
  Administered 2017-08-31: 09:00:00 via INTRAVENOUS

## 2017-08-31 NOTE — Progress Notes (Signed)
CSW provided patient with cab voucher to address listed on facesheet due to chronic back pain.  CSW notes patient has been in hospital recently and needed assistance with transportation- CSW provided patient with [employer] application. Patient states he is able to complete application.   Kingsley Spittle, [facility] Emergency Room Clinical Social Worker [phone]

## 2017-08-31 NOTE — ED Triage Notes (Signed)
Hx of chronic fatigue and weakness. Noncompliant with follow up. Pt left AMA last visit for low Na. Pt ambulatory on scene. Problem with transportation to drs appts. BP en route 160/98 . HR 92

## 2017-08-31 NOTE — ED Notes (Signed)
Bed: PC34 Expected date:  Expected time:  Means of arrival:  Comments: 62 yo HA, weakness

## 2017-08-31 NOTE — ED Provider Notes (Signed)
[facility] DEPT Provider Note   CSN: [id number] Arrival date & time: 08/31/17  0802     History   Chief Complaint Chief Complaint  Patient presents with  . Fatigue    HPI Anthony Lamb is a 62 y.o. male with history of alcohol abuse, anxiety, chronic pain, ulcerative colitis, GERD, HTN, SIADH presents for evaluation of chronic fatigue and weakness.  He was seen and evaluated in the ED on 08/19/17 and found to be hyponatremic. H he notes that he has felt e was discharged 2 days later despite hospitalist recommendations to stay for further correction of his sodium.  Little bit better but shortly after discharge he again felt anxious, fatigued.  He has intermittent headaches which are similar to other headaches he has had in the past.  He states that he still drinks alcohol every night, approximately 4 light beers.  Last alcohol intake was last night.  He is no longer taking HCTZ.  He notes decreased appetite and states that he only eats a few bites "here and there ".  He notes all of the symptoms have been chronic and ongoing but that he just feels "so overwhelmed with everything that I do not know what to do ".  He denies suicidal ideation or homicidal ideation but notes that he feels quite anxious.  His primary care doctor is managing his anxiety.  The history is provided by the patient.    Past Medical History:  Diagnosis Date  . Alcohol abuse, in remission   . Anemia   . Ankylosing spondylitis ([place])   . Anxiety   . Chronic pain   . Colitis   . Depression   . Esophagitis   . Gastritis   . GERD (gastroesophageal reflux disease)   . Hypertension   . IBS (irritable bowel syndrome)   . Poor dentition   . SIADH (syndrome of inappropriate ADH production) [facility])     Patient Active Problem List   Diagnosis Date Noted  . IBS (irritable bowel syndrome)   . Hypertension   . Colitis   . Alcohol abuse, in remission   . Adjustment disorder with mixed  anxiety and depressed mood 05/11/2017  . Dehydration 12/13/2016  . Alcohol abuse   . SOB (shortness of breath)   . Chest pain 11/11/2016  . Anxiety   . Gastroesophageal reflux disease   . Osteopenia determined by x-ray 08/06/2014  . Stress fracture of calcaneus 08/06/2014  . Vitamin D deficiency 12/18/2013  . Dementia 12/18/2013  . Benign microscopic hematuria 07/06/2013  . SIADH (syndrome of inappropriate ADH production) ([place]) 06/22/2013  . Hyponatremia 06/20/2013  . Ankylosing spondylitis ([place]) 06/20/2013  . Malnutrition of moderate degree ([place]) 06/20/2013  . BPH (benign prostatic hyperplasia) 08/22/2010  . Backache 04/19/2010  . ALCOHOLISM 08/13/2008  . History of colonic polyps 08/13/2008  . Essential hypertension 07/03/2007  . PUD (peptic ulcer disease) 07/03/2007  . Irritable bowel syndrome 07/03/2007    Past Surgical History:  Procedure Laterality Date  . COLONOSCOPY W/ BIOPSIES    . ESOPHAGOGASTRODUODENOSCOPY    . HERNIA REPAIR Bilateral   . TONSILLECTOMY          Home Medications    Prior to Admission medications   Medication Sig Start Date End Date Taking? Authorizing Provider  acetaminophen (TYLENOL) 500 MG tablet Take 1,000 mg by mouth every 6 (six) hours as needed (For headache and back pain.).    Yes [provider]  Cholecalciferol (VITAMIN D) 2000  units CAPS Take 4,000 Units by mouth daily.    Yes [provider]  ferrous sulfate 325 (65 FE) MG tablet Take 325 mg by mouth daily with breakfast.   Yes [provider]  hydrOXYzine (ATARAX/VISTARIL) 25 MG tablet Take 1 tablet (25 mg total) by mouth every 6 (six) hours as needed for anxiety. 06/11/17  Yes Quentin Cornwall, [place] N, PA-C  lisinopril (PRINIVIL,ZESTRIL) 10 MG tablet Take 1 tablet (10 mg total) by mouth daily. Patient taking differently: Take 10 mg by mouth every evening.  08/21/17 09/20/17 Yes Lacroce, Hulen Shouts, MD  LORazepam (ATIVAN) 0.5 MG tablet Take 0.5 mg by mouth 3 (three)  times daily as needed for anxiety.   Yes [provider]  Multiple Vitamins-Minerals (MENS ONE DAILY PO) Take 1 tablet by mouth daily.   Yes [provider]  pantoprazole (PROTONIX) 40 MG tablet Take 1 tablet (40 mg total) by mouth daily. Patient taking differently: Take 40 mg by mouth daily with breakfast.  04/24/14  Yes Janith Lima, MD  sodium chloride (OCEAN) 0.65 % SOLN nasal spray Place 1 spray into both nostrils as needed for congestion.   Yes [provider]  sucralfate (CARAFATE) 1 G tablet Take 1 tablet (1 g total) by mouth 4 (four) times daily -  with meals and at bedtime. Chew before swallowing Patient taking differently: Take 1 g by mouth 4 (four) times daily. Chew before swallowing 12/17/13  Yes Janith Lima, MD  verapamil (CALAN-SR) 240 MG CR tablet Take 240 mg by mouth at bedtime. 02/09/17  Yes [provider]  zolpidem (AMBIEN) 10 MG tablet Take 1 tablet (10 mg total) by mouth at bedtime as needed for sleep. Patient taking differently: Take 10 mg by mouth at bedtime.  05/13/17 08/31/17 Yes Khatri, [place], PA-C    Family History Family History  Problem Relation Age of Onset  . Anxiety disorder Mother   . Congestive Heart Failure Mother   . Crohn's disease Mother   . Colon cancer Mother 76  . Arthritis Father   . High blood pressure Father     Social History Social History   Tobacco Use  . Smoking status: Former Smoker    Types: Cigarettes  . Smokeless tobacco: Never Used  Substance Use Topics  . Alcohol use: Yes    Comment: daily/ 3-4 beers  . Drug use: No     Allergies   Diphenhydramine hcl; Flexeril [cyclobenzaprine]; Hctz [hydrochlorothiazide]; and Nsaids   Review of Systems Review of Systems  Constitutional: Positive for fatigue. Negative for chills and fever.  Cardiovascular: Negative for chest pain.  Gastrointestinal: Positive for diarrhea (Chronic, unchanged) and nausea.  Neurological: Positive for headaches  (Chronic, unchanged). Negative for syncope and numbness.  Psychiatric/Behavioral: The patient is nervous/anxious.   All other systems reviewed and are negative.    Physical Exam Updated Vital Signs BP (!) 156/81   Pulse 83   Temp 98.1 F (36.7 C) (Oral)   Resp 17   Ht 5' 11"  (1.803 m)   Wt 83.9 kg (185 lb)   SpO2 98%   BMI 25.80 kg/m   Physical Exam  Constitutional: He is oriented to person, place, and time. He appears well-developed and well-nourished. No distress.  HENT:  Head: Normocephalic and atraumatic.  Eyes: Conjunctivae are normal. Right eye exhibits no discharge. Left eye exhibits no discharge.  Neck: No JVD present. No tracheal deviation present.  Cardiovascular: Normal rate, regular rhythm, normal heart sounds and intact distal pulses.  Pulmonary/Chest: Effort normal and breath sounds normal.  Abdominal: Soft. Bowel sounds are normal. He exhibits no distension.  Musculoskeletal: He exhibits no edema.  5/5 strength of BUE and BLE major muscle groups.  Neurological: He is alert and oriented to person, place, and time. No cranial nerve deficit or sensory deficit. He exhibits normal muscle tone.  Mental Status:  Alert, thought content appropriate, able to give a coherent history. Speech fluent without evidence of aphasia. Able to follow 2 step commands without difficulty.  Cranial Nerves:  II:  Peripheral visual fields grossly normal, pupils equal, round, reactive to light III,IV, VI: ptosis not present, extra-ocular motions intact bilaterally  V,VII: smile symmetric, facial light touch sensation equal VIII: hearing grossly normal to voice  X: uvula elevates symmetrically  XI: bilateral shoulder shrug symmetric and strong XII: midline tongue extension without fassiculations Motor:  Normal tone. 5/5 strength of BUE and BLE major muscle groups including strong and equal grip strength and dorsiflexion/plantar flexion Sensory: light touch normal in all  extremities. Gait: normal gait and balance. Able to walk on toes and heels with ease.    Skin: Skin is warm and dry. No erythema.  Psychiatric: His speech is normal and behavior is normal. His mood appears anxious. He is not actively hallucinating. He expresses no homicidal and no suicidal ideation. He expresses no suicidal plans and no homicidal plans.  Nursing note and vitals reviewed.    ED Treatments / Results  Labs (all labs ordered are listed, but only abnormal results are displayed) Labs Reviewed  CBC WITH DIFFERENTIAL/PLATELET - Abnormal; Notable for the following components:      Result Value   Neutro Abs 8.2 (*)    All other components within normal limits  COMPREHENSIVE METABOLIC PANEL - Abnormal; Notable for the following components:   Sodium 133 (*)    Chloride 95 (*)    BUN <5 (*)    Calcium 8.8 (*)    All other components within normal limits  URINALYSIS, ROUTINE W REFLEX MICROSCOPIC - Abnormal; Notable for the following components:   Specific Gravity, Urine 1.003 (*)    Hgb urine dipstick MODERATE (*)    Bacteria, UA RARE (*)    All other components within normal limits  RAPID URINE DRUG SCREEN, HOSP PERFORMED - Abnormal; Notable for the following components:   Benzodiazepines POSITIVE (*)    All other components within normal limits  ETHANOL    EKG None  Radiology No results found.  Procedures Procedures (including critical care time)  Medications Ordered in ED Medications  0.9 %  sodium chloride infusion ( Intravenous New Bag/Given 08/31/17 0830)  ondansetron (ZOFRAN-ODT) disintegrating tablet 4 mg (4 mg Oral Given 08/31/17 0941)  LORazepam (ATIVAN) tablet 0.5 mg (0.5 mg Oral Given 08/31/17 0941)     Initial Impression / Assessment and Plan / ED Course  I have reviewed the triage vital signs and the nursing notes.  Pertinent labs & imaging results that were available during my care of the patient were reviewed by me and considered in my medical  decision making (see chart for details).     Patient presents complaining of chronic fatigue.  No worsening of his usual symptoms.  He is afebrile, vital signs are at patient's baseline.  He is anxious but nontoxic in appearance.  Ambulatory without difficulty and exhibits good gait and balance. lab work today reviewed by me shows no EtOH on board, does not appear to be in DTs.  He is not significantly  hyponatremic or hypochloremic.  No leukocytosis or anemia.  UA is not concerning for UTI or nephrolithiasis.  No further emergent workup required at this time.  I had an extensive discussion with the patient regarding his symptoms and recommend continued fluid restriction per hospitalist recommendations.  I also encouraged the patient to continue limiting his alcohol intake and eventually quitting entirely.  He is no longer on HCTZ.  Social work was consulted and they were able to help provide transportation for the patient.  Discussed indications for return to the ED.  He will contact his primary care physician today to set up follow-up appointment early next week.  Pt verbalized understanding of and agreement with plan and is safe for discharge home at this time.   Final Clinical Impressions(s) / ED Diagnoses   Final diagnoses:  Chronic fatigue    ED Discharge Orders    None       Debroah Baller 08/31/17 1246    Blanchie Dessert, MD 08/31/17 1551

## 2017-08-31 NOTE — Discharge Instructions (Signed)
Continue to limit your water intake to 1 L of water or less.  Try to stop drinking alcohol.  Follow-up with your primary care physician for reevaluation.  Return to the emergency department if any concerning signs or symptoms develop.

## 2017-08-31 NOTE — ED Notes (Signed)
Pt provided sandwich, peanut butter, crackers and sprite

## 2017-09-19 ENCOUNTER — Emergency Department (HOSPITAL_COMMUNITY)
Admission: EM | Admit: 2017-09-19 | Discharge: 2017-09-19 | Disposition: A | Discharge status: Home / Self Care | Payer: Medicaid Other | Attending: Emergency Medicine | Admitting: Emergency Medicine

## 2017-09-19 DIAGNOSIS — G8929 Other chronic pain: Secondary | ICD-10-CM | POA: Diagnosis not present

## 2017-09-19 DIAGNOSIS — R110 Nausea: Secondary | ICD-10-CM | POA: Insufficient documentation

## 2017-09-19 DIAGNOSIS — I10 Essential (primary) hypertension: Secondary | ICD-10-CM | POA: Diagnosis not present

## 2017-09-19 DIAGNOSIS — Z87891 Personal history of nicotine dependence: Secondary | ICD-10-CM | POA: Diagnosis not present

## 2017-09-19 DIAGNOSIS — R109 Unspecified abdominal pain: Secondary | ICD-10-CM | POA: Diagnosis present

## 2017-09-19 DIAGNOSIS — Z79899 Other long term (current) drug therapy: Secondary | ICD-10-CM | POA: Diagnosis not present

## 2017-09-19 DIAGNOSIS — F0390 Unspecified dementia without behavioral disturbance: Secondary | ICD-10-CM | POA: Diagnosis not present

## 2017-09-19 DIAGNOSIS — R51 Headache: Secondary | ICD-10-CM | POA: Diagnosis not present

## 2017-09-19 LAB — COMPREHENSIVE METABOLIC PANEL
ALBUMIN: 4 g/dL (ref 3.5–5.0)
ALT: 20 U/L (ref 17–63)
ANION GAP: 12 (ref 5–15)
AST: 22 U/L (ref 15–41)
Alkaline Phosphatase: 94 U/L (ref 38–126)
BILIRUBIN TOTAL: 0.8 mg/dL (ref 0.3–1.2)
CO2: 25 mmol/L (ref 22–32)
Calcium: 9 mg/dL (ref 8.9–10.3)
Chloride: 91 mmol/L — ABNORMAL LOW (ref 101–111)
Creatinine, Ser: 0.8 mg/dL (ref 0.61–1.24)
GFR calc Af Amer: >60 mL/min (ref >60)
GLUCOSE: 113 mg/dL — AB (ref 65–99)
Potassium: 4.6 mmol/L (ref 3.5–5.1)
Sodium: 128 mmol/L — ABNORMAL LOW (ref 135–145)
TOTAL PROTEIN: 7.6 g/dL (ref 6.5–8.1)

## 2017-09-19 LAB — URINALYSIS, ROUTINE W REFLEX MICROSCOPIC
Bilirubin Urine: NEGATIVE
Glucose, UA: NEGATIVE mg/dL
Ketones, ur: NEGATIVE mg/dL
Leukocytes, UA: NEGATIVE
Nitrite: NEGATIVE
Protein, ur: NEGATIVE mg/dL
SPECIFIC GRAVITY, URINE: 1.008 (ref 1.005–1.030)
SQUAMOUS EPITHELIAL / LPF: NONE SEEN
pH: 8 (ref 5.0–8.0)

## 2017-09-19 LAB — CBC WITH DIFFERENTIAL/PLATELET
BASOS PCT: 0 %
Basophils Absolute: 0 10*3/uL (ref 0.0–0.1)
Eosinophils Absolute: 0 10*3/uL (ref 0.0–0.7)
Eosinophils Relative: 0 %
HEMATOCRIT: 44.8 % (ref 39.0–52.0)
HEMOGLOBIN: 15.5 g/dL (ref 13.0–17.0)
LYMPHS PCT: 8 %
Lymphs Abs: 1 10*3/uL (ref 0.7–4.0)
MCH: 31.4 pg (ref 26.0–34.0)
MCHC: 34.6 g/dL (ref 30.0–36.0)
MCV: 90.7 fL (ref 78.0–100.0)
MONO ABS: 1.3 10*3/uL — AB (ref 0.1–1.0)
MONOS PCT: 11 %
NEUTROS ABS: 9.9 10*3/uL — AB (ref 1.7–7.7)
NEUTROS PCT: 81 %
Platelets: 386 10*3/uL (ref 150–400)
RBC: 4.94 MIL/uL (ref 4.22–5.81)
RDW: 12.5 % (ref 11.5–15.5)
WBC: 12.3 10*3/uL — ABNORMAL HIGH (ref 4.0–10.5)

## 2017-09-19 LAB — LIPASE, BLOOD: Lipase: 26 U/L (ref 11–51)

## 2017-09-19 MED ORDER — PROCHLORPERAZINE EDISYLATE 5 MG/ML IJ SOLN
10.0000 mg | Freq: Once | INTRAMUSCULAR | Status: AC
  Administered 2017-09-19: 10 mg via INTRAVENOUS
  Filled 2017-09-19: qty 2

## 2017-09-19 MED ORDER — ONDANSETRON 4 MG PO TBDP: ORAL_TABLET | ORAL | 0 refills | Status: DC

## 2017-09-19 MED ORDER — LORAZEPAM 0.5 MG PO TABS
0.5000 mg | ORAL_TABLET | Freq: Once | ORAL | Status: AC
  Administered 2017-09-19: 0.5 mg via ORAL
  Filled 2017-09-19: qty 1

## 2017-09-19 MED ORDER — SODIUM CHLORIDE 0.9 % IV BOLUS
1000.0000 mL | Freq: Once | INTRAVENOUS | Status: AC
  Administered 2017-09-19: 1000 mL via INTRAVENOUS

## 2017-09-19 NOTE — ED Notes (Signed)
Bed: RW41 Expected date:  Expected time:  Means of arrival:  Comments: 62 yo M  Headache x 3 days

## 2017-09-19 NOTE — ED Triage Notes (Signed)
Per EMS, pt coming from home with complaints of a headache. Pt stated that his pain was in his forehead and described as numbness. Pt reports moderate PO intake and increased urination. Pt also complains of abdominal pain that is only relieved with belching. Pt recently hospitalized for dehydration. Pt has a hx of GERD, IBS, and hypertension. Pt AO x4 and ambulates independently.

## 2017-09-19 NOTE — ED Provider Notes (Signed)
[facility] DEPT Provider Note   CSN: [id number] Arrival date & time: 09/19/17  0715     History   Chief Complaint Chief Complaint  Patient presents with  . Headache; abdominal pain    HPI Anthony Lamb is a 62 y.o. male.  83 yoM with a chief complaint of headache and nausea.  This been going on for the past couple days.  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  Patient thinks that this is a recurrence of the same.  He denies fevers or chills denies cough congestion.  He has been drinking about 4 beers a day.  Denies trauma.  Has nausea denies vomiting. Headache is diffuse about the head.  Nothing makes it better or worse.  Abdominal discomfort but feels like his chronic without change.   The history is provided by the patient.  Illness  This is a new problem. The current episode started less than 1 hour ago. The problem occurs constantly. The problem has not changed since onset.Associated symptoms include abdominal pain and headaches. Pertinent negatives include no chest pain and no shortness of breath. Nothing aggravates the symptoms. Nothing relieves the symptoms. He has tried nothing for the symptoms. The treatment provided no relief.    Past Medical History:  Diagnosis Date  . Alcohol abuse, in remission   . Anemia   . Ankylosing spondylitis ([place])   . Anxiety   . Chronic pain   . Colitis   . Depression   . Esophagitis   . Gastritis   . GERD (gastroesophageal reflux disease)   . Hypertension   . IBS (irritable bowel syndrome)   . Poor dentition   . SIADH (syndrome of inappropriate ADH production) [facility])     Patient Active Problem List   Diagnosis Date Noted  . IBS (irritable bowel syndrome)   . Hypertension   . Colitis   . Alcohol abuse, in remission   . Adjustment disorder with mixed anxiety and depressed mood 05/11/2017  . Dehydration 12/13/2016  .  Alcohol abuse   . SOB (shortness of breath)   . Chest pain 11/11/2016  . Anxiety   . Gastroesophageal reflux disease   . Osteopenia determined by x-ray 08/06/2014  . Stress fracture of calcaneus 08/06/2014  . Vitamin D deficiency 12/18/2013  . Dementia 12/18/2013  . Benign microscopic hematuria 07/06/2013  . SIADH (syndrome of inappropriate ADH production) ([place]) 06/22/2013  . Hyponatremia 06/20/2013  . Ankylosing spondylitis ([place]) 06/20/2013  . Malnutrition of moderate degree ([place]) 06/20/2013  . BPH (benign prostatic hyperplasia) 08/22/2010  . Backache 04/19/2010  . ALCOHOLISM 08/13/2008  . History of colonic polyps 08/13/2008  . Essential hypertension 07/03/2007  . PUD (peptic ulcer disease) 07/03/2007  . Irritable bowel syndrome 07/03/2007    Past Surgical History:  Procedure Laterality Date  . COLONOSCOPY W/ BIOPSIES    . ESOPHAGOGASTRODUODENOSCOPY    . HERNIA REPAIR Bilateral   . TONSILLECTOMY          Home Medications    Prior to Admission medications   Medication Sig Start Date End Date Taking? Authorizing Provider  acetaminophen (TYLENOL) 500 MG tablet Take 1,000 mg by mouth every 6 (six) hours as needed (For headache and back pain.).    Yes [provider]  Cholecalciferol (VITAMIN D) 2000 units CAPS Take 4,000 Units by mouth daily.    Yes [provider]  ferrous sulfate 325 (65 FE)  MG tablet Take 325 mg by mouth daily with breakfast.   Yes [provider]  hydrOXYzine (ATARAX/VISTARIL) 25 MG tablet Take 1 tablet (25 mg total) by mouth every 6 (six) hours as needed for anxiety. 06/11/17  Yes Quentin Cornwall, [place] N, PA-C  lisinopril (PRINIVIL,ZESTRIL) 10 MG tablet Take 1 tablet (10 mg total) by mouth daily. Patient taking differently: Take 10 mg by mouth every evening.  08/21/17 09/20/17 Yes Lacroce, Hulen Shouts, MD  LORazepam (ATIVAN) 0.5 MG tablet Take 0.5 mg by mouth 3 (three) times daily as needed for anxiety.   Yes [provider]    Multiple Vitamins-Minerals (MENS ONE DAILY PO) Take 1 tablet by mouth daily.   Yes [provider]  pantoprazole (PROTONIX) 40 MG tablet Take 1 tablet (40 mg total) by mouth daily. Patient taking differently: Take 40 mg by mouth daily with breakfast.  04/24/14  Yes Janith Lima, MD  sodium chloride (OCEAN) 0.65 % SOLN nasal spray Place 1 spray into both nostrils as needed for congestion.   Yes [provider]  sucralfate (CARAFATE) 1 G tablet Take 1 tablet (1 g total) by mouth 4 (four) times daily -  with meals and at bedtime. Chew before swallowing Patient taking differently: Take 1 g by mouth 4 (four) times daily. Chew before swallowing 12/17/13  Yes Janith Lima, MD  traZODone (DESYREL) 100 MG tablet Take 100 mg by mouth at bedtime. 09/03/17  Yes [provider]  verapamil (CALAN-SR) 240 MG CR tablet Take 240 mg by mouth at bedtime. 02/09/17  Yes [provider]  ondansetron (ZOFRAN ODT) 4 MG disintegrating tablet 76m ODT q4 hours prn nausea/vomit 09/19/17   FDeno Etienne DO  zolpidem (AMBIEN) 10 MG tablet Take 1 tablet (10 mg total) by mouth at bedtime as needed for sleep. Patient taking differently: Take 10 mg by mouth at bedtime.  05/13/17 08/31/17  KDelia Heady PA-C    Family History Family History  Problem Relation Age of Onset  . Anxiety disorder Mother   . Congestive Heart Failure Mother   . Crohn's disease Mother   . Colon cancer Mother 678 . Arthritis Father   . High blood pressure Father     Social History Social History   Tobacco Use  . Smoking status: Former Smoker    Types: Cigarettes  . Smokeless tobacco: Never Used  Substance Use Topics  . Alcohol use: Yes    Comment: daily/ 3-4 beers  . Drug use: No     Allergies   Diphenhydramine hcl; Flexeril [cyclobenzaprine]; Hctz [hydrochlorothiazide]; and Nsaids   Review of Systems Review of Systems  Constitutional: Negative for chills and fever.  HENT: Negative for congestion  and facial swelling.   Eyes: Negative for discharge and visual disturbance.  Respiratory: Negative for shortness of breath.   Cardiovascular: Negative for chest pain and palpitations.  Gastrointestinal: Positive for abdominal pain and nausea. Negative for diarrhea and vomiting.  Musculoskeletal: Negative for arthralgias and myalgias.  Skin: Negative for color change and rash.  Neurological: Positive for headaches. Negative for tremors and syncope.  Psychiatric/Behavioral: Negative for confusion and dysphoric mood.     Physical Exam Updated Vital Signs BP (!) 175/96   Pulse 74   Temp 97.8 F (36.6 C) (Oral)   Resp 15   SpO2 100%   Physical Exam  Constitutional: He is oriented to person, place, and time. He appears well-developed and well-nourished.  HENT:  Head: Normocephalic and atraumatic.  Eyes: Pupils are  equal, round, and reactive to light. EOM are normal.  Neck: Normal range of motion. Neck supple. No JVD present.  Cardiovascular: Normal rate and regular rhythm. Exam reveals no gallop and no friction rub.  No murmur heard. Pulmonary/Chest: No respiratory distress. He has no wheezes.  Abdominal: He exhibits no distension. There is no rebound and no guarding.  Musculoskeletal: Normal range of motion.  Neurological: He is alert and oriented to person, place, and time. He has normal strength. No cranial nerve deficit or sensory deficit. He displays a negative Romberg sign. Coordination and gait normal. He displays no Babinski's sign on the right side. He displays no Babinski's sign on the left side.  Skin: No rash noted. No pallor.  Psychiatric: He has a normal mood and affect. His behavior is normal.  Nursing note and vitals reviewed.    ED Treatments / Results  Labs (all labs ordered are listed, but only abnormal results are displayed) Labs Reviewed  CBC WITH DIFFERENTIAL/PLATELET - Abnormal; Notable for the following components:      Result Value   WBC 12.3 (*)     Neutro Abs 9.9 (*)    Monocytes Absolute 1.3 (*)    All other components within normal limits  COMPREHENSIVE METABOLIC PANEL - Abnormal; Notable for the following components:   Sodium 128 (*)    Chloride 91 (*)    Glucose, Bld 113 (*)    BUN <5 (*)    All other components within normal limits  URINALYSIS, ROUTINE W REFLEX MICROSCOPIC - Abnormal; Notable for the following components:   Hgb urine dipstick SMALL (*)    Bacteria, UA RARE (*)    All other components within normal limits  LIPASE, BLOOD    EKG None  Radiology No results found.  Procedures Procedures (including critical care time)  Medications Ordered in ED Medications  sodium chloride 0.9 % bolus 1,000 mL (0 mLs Intravenous Stopped 09/19/17 0952)  prochlorperazine (COMPAZINE) injection 10 mg (10 mg Intravenous Given 09/19/17 0910)  LORazepam (ATIVAN) tablet 0.5 mg (0.5 mg Oral Given 09/19/17 8119)     Initial Impression / Assessment and Plan / ED Course  I have reviewed the triage vital signs and the nursing notes.  Pertinent labs & imaging results that were available during my care of the patient were reviewed by me and considered in my medical decision making (see chart for details).     62 yo M with a chief complaint of diffuse headache and nausea.  This been going on for the past couple days.  The patient has a history of hyponatremia and he thinks that he is come down with a recurrence of this.  Is not been eating and drinking well and has been drinking plenty of alcohol.  He denies any infectious symptoms denies trauma.  Will obtain labs give IV fluids to cocktail and reassess.  Patient feels better after headache cocktail.  Labs are reassuring.  Mild leukocytosis sodium is about at his baseline.  He is given a liter fluids and feels better.  Discharge home with PCP follow-up.  10:55 AM:  I have discussed the diagnosis/risks/treatment options with the patient and believe the pt to be eligible for discharge  home to follow-up with PCP. We also discussed returning to the ED immediately if new or worsening sx occur. We discussed the sx which are most concerning (e.g., sudden worsening pain, fever, inability to tolerate by mouth) that necessitate immediate return. Medications administered to the patient during their visit  and any new prescriptions provided to the patient are listed below.  Medications given during this visit Medications  sodium chloride 0.9 % bolus 1,000 mL (0 mLs Intravenous Stopped 09/19/17 0952)  prochlorperazine (COMPAZINE) injection 10 mg (10 mg Intravenous Given 09/19/17 0910)  LORazepam (ATIVAN) tablet 0.5 mg (0.5 mg Oral Given 09/19/17 0952)    Labs reviewed mild leukocytosis, sodium at 128 which is similar to prior. Images reviewed n/a DDX migraine, viral syndrome, dehydration Old records reviewed the patient with multiple visits for the same.  Thought most likely to be alcohol induced hyponatremia.  The patient appears reasonably screen and/or stabilized for discharge and I doubt any other medical condition or other [facility] requiring further screening, evaluation, or treatment in the ED at this time prior to discharge.    Final Clinical Impressions(s) / ED Diagnoses   Final diagnoses:  Chronic abdominal pain  Nausea    ED Discharge Orders        Ordered    ondansetron (ZOFRAN ODT) 4 MG disintegrating tablet     09/19/17 [place], Prisha Hiley, DO 09/19/17 1055

## 2017-09-29 ENCOUNTER — Emergency Department (HOSPITAL_COMMUNITY): Payer: Medicaid Other

## 2017-09-29 ENCOUNTER — Other Ambulatory Visit: Payer: Self-pay

## 2017-09-29 ENCOUNTER — Emergency Department (HOSPITAL_COMMUNITY)
Admission: EM | Admit: 2017-09-29 | Discharge: 2017-09-29 | Disposition: A | Discharge status: Home / Self Care | Payer: Medicaid Other | Attending: Emergency Medicine | Admitting: Emergency Medicine

## 2017-09-29 ENCOUNTER — Encounter (HOSPITAL_COMMUNITY): Payer: Self-pay

## 2017-09-29 DIAGNOSIS — I10 Essential (primary) hypertension: Secondary | ICD-10-CM | POA: Diagnosis not present

## 2017-09-29 DIAGNOSIS — F419 Anxiety disorder, unspecified: Secondary | ICD-10-CM | POA: Diagnosis not present

## 2017-09-29 DIAGNOSIS — F13230 Sedative, hypnotic or anxiolytic dependence with withdrawal, uncomplicated: Secondary | ICD-10-CM | POA: Diagnosis not present

## 2017-09-29 DIAGNOSIS — R1013 Epigastric pain: Secondary | ICD-10-CM | POA: Diagnosis present

## 2017-09-29 DIAGNOSIS — Z87891 Personal history of nicotine dependence: Secondary | ICD-10-CM | POA: Diagnosis not present

## 2017-09-29 DIAGNOSIS — Z79899 Other long term (current) drug therapy: Secondary | ICD-10-CM | POA: Insufficient documentation

## 2017-09-29 DIAGNOSIS — R1084 Generalized abdominal pain: Secondary | ICD-10-CM | POA: Diagnosis not present

## 2017-09-29 DIAGNOSIS — F13930 Sedative, hypnotic or anxiolytic use, unspecified with withdrawal, uncomplicated: Secondary | ICD-10-CM

## 2017-09-29 LAB — COMPREHENSIVE METABOLIC PANEL
ALBUMIN: 3.9 g/dL (ref 3.5–5.0)
ALT: 19 U/L (ref 17–63)
ANION GAP: 11 (ref 5–15)
AST: 24 U/L (ref 15–41)
Alkaline Phosphatase: 89 U/L (ref 38–126)
BILIRUBIN TOTAL: 0.5 mg/dL (ref 0.3–1.2)
BUN: 5 mg/dL — AB (ref 6–20)
CHLORIDE: 97 mmol/L — AB (ref 101–111)
CO2: 26 mmol/L (ref 22–32)
Calcium: 9.1 mg/dL (ref 8.9–10.3)
Creatinine, Ser: 0.67 mg/dL (ref 0.61–1.24)
GFR calc Af Amer: >60 mL/min (ref >60)
GFR calc non Af Amer: >60 mL/min (ref >60)
GLUCOSE: 107 mg/dL — AB (ref 65–99)
POTASSIUM: 4.5 mmol/L (ref 3.5–5.1)
SODIUM: 134 mmol/L — AB (ref 135–145)
Total Protein: 7.2 g/dL (ref 6.5–8.1)

## 2017-09-29 LAB — URINALYSIS, ROUTINE W REFLEX MICROSCOPIC
BACTERIA UA: NONE SEEN
BILIRUBIN URINE: NEGATIVE
Glucose, UA: NEGATIVE mg/dL
Ketones, ur: NEGATIVE mg/dL
Leukocytes, UA: NEGATIVE
Nitrite: NEGATIVE
PH: 7 (ref 5.0–8.0)
Protein, ur: NEGATIVE mg/dL
SPECIFIC GRAVITY, URINE: 1.006 (ref 1.005–1.030)

## 2017-09-29 LAB — CBC
HEMATOCRIT: 47.4 % (ref 39.0–52.0)
HEMOGLOBIN: 16.1 g/dL (ref 13.0–17.0)
MCH: 31.6 pg (ref 26.0–34.0)
MCHC: 34 g/dL (ref 30.0–36.0)
MCV: 93.1 fL (ref 78.0–100.0)
Platelets: 370 10*3/uL (ref 150–400)
RBC: 5.09 MIL/uL (ref 4.22–5.81)
RDW: 12.8 % (ref 11.5–15.5)
WBC: 11 10*3/uL — ABNORMAL HIGH (ref 4.0–10.5)

## 2017-09-29 LAB — LIPASE, BLOOD: LIPASE: 27 U/L (ref 11–51)

## 2017-09-29 LAB — ETHANOL: Alcohol, Ethyl (B): <10 mg/dL (ref <10)

## 2017-09-29 MED ORDER — ONDANSETRON 4 MG PO TBDP
4.0000 mg | ORAL_TABLET | Freq: Once | ORAL | Status: AC | PRN
  Administered 2017-09-29: 4 mg via ORAL
  Filled 2017-09-29: qty 1

## 2017-09-29 MED ORDER — MORPHINE SULFATE (PF) 4 MG/ML IV SOLN
4.0000 mg | Freq: Once | INTRAVENOUS | Status: AC
  Administered 2017-09-29: 4 mg via INTRAVENOUS
  Filled 2017-09-29: qty 1

## 2017-09-29 MED ORDER — SODIUM CHLORIDE 0.9 % IV BOLUS
1000.0000 mL | Freq: Once | INTRAVENOUS | Status: AC
  Administered 2017-09-29: 1000 mL via INTRAVENOUS

## 2017-09-29 MED ORDER — LORAZEPAM 2 MG/ML IJ SOLN
1.0000 mg | Freq: Once | INTRAMUSCULAR | Status: AC
  Administered 2017-09-29: 1 mg via INTRAVENOUS
  Filled 2017-09-29: qty 1

## 2017-09-29 MED ORDER — DIAZEPAM 5 MG PO TABS: 5.0000 mg | ORAL_TABLET | Freq: Two times a day (BID) | ORAL | 0 refills | Status: DC

## 2017-09-29 NOTE — ED Provider Notes (Addendum)
[facility] DEPT Provider Note   CSN: [id number] Arrival date & time: 09/29/17  0900     History   Chief Complaint Chief Complaint  Patient presents with  . Abdominal Pain    HPI Anthony Lamb is a 62 y.o. male.  Pt presents to the ED today with abdominal pain and n/v.  The pt has a hx of chronic abdominal pain.  Pt has also run out of his lorazepam.  He has had these sx in the past when he runs out.  Pt said he feels like he can't sit still.  He also has severe dental caries and is supposed to have them removed and a denture placed, but he has not been able to get this done yet.  He c/o pain to his gums.  The pt denies f/c.       Past Medical History:  Diagnosis Date  . Alcohol abuse, in remission   . Anemia   . Ankylosing spondylitis ([place])   . Anxiety   . Chronic pain   . Colitis   . Depression   . Esophagitis   . Gastritis   . GERD (gastroesophageal reflux disease)   . Hypertension   . IBS (irritable bowel syndrome)   . Poor dentition   . SIADH (syndrome of inappropriate ADH production) [facility])     Patient Active Problem List   Diagnosis Date Noted  . IBS (irritable bowel syndrome)   . Hypertension   . Colitis   . Alcohol abuse, in remission   . Adjustment disorder with mixed anxiety and depressed mood 05/11/2017  . Dehydration 12/13/2016  . Alcohol abuse   . SOB (shortness of breath)   . Chest pain 11/11/2016  . Anxiety   . Gastroesophageal reflux disease   . Osteopenia determined by x-ray 08/06/2014  . Stress fracture of calcaneus 08/06/2014  . Vitamin D deficiency 12/18/2013  . Dementia 12/18/2013  . Benign microscopic hematuria 07/06/2013  . SIADH (syndrome of inappropriate ADH production) ([place]) 06/22/2013  . Hyponatremia 06/20/2013  . Ankylosing spondylitis ([place]) 06/20/2013  . Malnutrition of moderate degree ([place]) 06/20/2013  . BPH (benign prostatic hyperplasia) 08/22/2010  . Backache 04/19/2010  . ALCOHOLISM  08/13/2008  . History of colonic polyps 08/13/2008  . Essential hypertension 07/03/2007  . PUD (peptic ulcer disease) 07/03/2007  . Irritable bowel syndrome 07/03/2007    Past Surgical History:  Procedure Laterality Date  . COLONOSCOPY W/ BIOPSIES    . ESOPHAGOGASTRODUODENOSCOPY    . HERNIA REPAIR Bilateral   . TONSILLECTOMY          Home Medications    Prior to Admission medications   Medication Sig Start Date End Date Taking? Authorizing Provider  acetaminophen (TYLENOL) 500 MG tablet Take 1,000 mg by mouth every 6 (six) hours as needed (For headache and back pain.).     [provider]  Cholecalciferol (VITAMIN D) 2000 units CAPS Take 4,000 Units by mouth daily.     [provider]  diazepam (VALIUM) 5 MG tablet Take 1 tablet (5 mg total) by mouth 2 (two) times daily. 09/29/17   [name], MD  ferrous sulfate 325 (65 FE) MG tablet Take 325 mg by mouth daily with breakfast.    [provider]  hydrOXYzine (ATARAX/VISTARIL) 25 MG tablet Take 1 tablet (25 mg total) by mouth every 6 (six) hours as needed for anxiety. 06/11/17   Robinson, [place] N, PA-C  lisinopril (PRINIVIL,ZESTRIL) 10 MG tablet Take 1 tablet (10  mg total) by mouth daily. Patient taking differently: Take 10 mg by mouth every evening.  08/21/17 09/20/17  Lacroce, Hulen Shouts, MD  LORazepam (ATIVAN) 0.5 MG tablet Take 0.5 mg by mouth 3 (three) times daily as needed for anxiety.    [provider]  Multiple Vitamins-Minerals (MENS ONE DAILY PO) Take 1 tablet by mouth daily.    [provider]  ondansetron (ZOFRAN ODT) 4 MG disintegrating tablet 76m ODT q4 hours prn nausea/vomit 09/19/17   FDeno Etienne DO  pantoprazole (PROTONIX) 40 MG tablet Take 1 tablet (40 mg total) by mouth daily. Patient taking differently: Take 40 mg by mouth daily with breakfast.  04/24/14   JJanith Lima MD  sodium chloride (OCEAN) 0.65 % SOLN nasal spray Place 1 spray into both nostrils as needed  for congestion.    [provider]  sucralfate (CARAFATE) 1 G tablet Take 1 tablet (1 g total) by mouth 4 (four) times daily -  with meals and at bedtime. Chew before swallowing Patient taking differently: Take 1 g by mouth 4 (four) times daily. Chew before swallowing 12/17/13   JJanith Lima MD  traZODone (DESYREL) 100 MG tablet Take 100 mg by mouth at bedtime. 09/03/17   [provider]  verapamil (CALAN-SR) 240 MG CR tablet Take 240 mg by mouth at bedtime. 02/09/17   [provider]  zolpidem (AMBIEN) 10 MG tablet Take 1 tablet (10 mg total) by mouth at bedtime as needed for sleep. Patient taking differently: Take 10 mg by mouth at bedtime.  05/13/17 08/31/17  KDelia Heady PA-C    Family History Family History  Problem Relation Age of Onset  . Anxiety disorder Mother   . Congestive Heart Failure Mother   . Crohn's disease Mother   . Colon cancer Mother 646 . Arthritis Father   . High blood pressure Father     Social History Social History   Tobacco Use  . Smoking status: Former Smoker    Types: Cigarettes  . Smokeless tobacco: Never Used  Substance Use Topics  . Alcohol use: Yes    Comment: daily/ 3-4 beers  . Drug use: No     Allergies   Diphenhydramine hcl; Flexeril [cyclobenzaprine]; Hctz [hydrochlorothiazide]; and Nsaids   Review of Systems Review of Systems  HENT: Positive for dental problem.   Gastrointestinal: Positive for abdominal pain, nausea and vomiting.  Psychiatric/Behavioral: The patient is nervous/anxious.   All other systems reviewed and are negative.    Physical Exam Updated Vital Signs BP (!) 153/81   Pulse 78   Temp (!) 97.5 F (36.4 C) (Oral)   Resp 16   Ht 5' 11"  (1.803 m)   Wt 80.3 kg (177 lb)   SpO2 99%   BMI 24.69 kg/m   Physical Exam  Constitutional: He is oriented to person, place, and time. He appears well-developed and well-nourished.  HENT:  Head: Normocephalic and atraumatic.  Mouth/Throat:  Oropharynx is clear and moist. Mucous membranes are dry.  Teeth worn down to gums.  Diffuse disease.  Eyes: Pupils are equal, round, and reactive to light. EOM are normal.  Cardiovascular: Normal rate, regular rhythm, normal heart sounds and intact distal pulses.  Pulmonary/Chest: Effort normal and breath sounds normal.  Abdominal: Normal appearance and bowel sounds are normal. There is tenderness in the epigastric area.  Neurological: He is alert and oriented to person, place, and time.  Skin: Skin is warm and dry. Capillary refill takes less than 2 seconds.  Psychiatric: His mood appears anxious.  Nursing note and vitals reviewed.    ED Treatments / Results  Labs (all labs ordered are listed, but only abnormal results are displayed) Labs Reviewed  COMPREHENSIVE METABOLIC PANEL - Abnormal; Notable for the following components:      Result Value   Sodium 134 (*)    Chloride 97 (*)    Glucose, Bld 107 (*)    BUN 5 (*)    All other components within normal limits  CBC - Abnormal; Notable for the following components:   WBC 11.0 (*)    All other components within normal limits  URINALYSIS, ROUTINE W REFLEX MICROSCOPIC - Abnormal; Notable for the following components:   Hgb urine dipstick SMALL (*)    All other components within normal limits  LIPASE, BLOOD  ETHANOL    EKG None  Radiology Dg Abdomen Acute W/chest  Result Date: 09/29/2017 CLINICAL DATA:  Diffuse abdominal pain and nausea for 3 days. EXAM: DG ABDOMEN ACUTE W/ 1V CHEST COMPARISON:  Chest radiograph on 07/31/2017 FINDINGS: There is no evidence of dilated bowel loops or free intraperitoneal air. No radiopaque calculi or other significant radiographic abnormality is seen. Heart size and mediastinal contours are within normal limits. Aortic atherosclerosis. Both lungs are clear. IMPRESSION: Unremarkable bowel gas pattern.  No acute findings. No active cardiopulmonary disease. Electronically Signed   By: Earle Gell M.D.    On: 09/29/2017 13:04    Procedures Procedures (including critical care time)  Medications Ordered in ED Medications  ondansetron (ZOFRAN-ODT) disintegrating tablet 4 mg (4 mg Oral Given 09/29/17 1014)  morphine 4 MG/ML injection 4 mg (4 mg Intravenous Given 09/29/17 1211)  sodium chloride 0.9 % bolus 1,000 mL (0 mLs Intravenous Stopped 09/29/17 1334)  LORazepam (ATIVAN) injection 1 mg (1 mg Intravenous Given 09/29/17 1211)     Initial Impression / Assessment and Plan / ED Course  I have reviewed the triage vital signs and the nursing notes.  Pertinent labs & imaging results that were available during my care of the patient were reviewed by me and considered in my medical decision making (see chart for details).     Pt is feeling much better.  Sx likely secondary to benzo w/dr.  Pt is eating a sandwich and is stable for d/c.  Return if worse.  F/u with pcp.  Pt given rx for valium 5 mg (#10).  [facility] pharmacy called and said pt just got 90 lorazepam filled 2 weeks ago.  I told [facility] to NOT fill the valium rx.  Final Clinical Impressions(s) / ED Diagnoses   Final diagnoses:  Generalized abdominal pain  Anxiety  Benzodiazepine withdrawal without complication [facility])    ED Discharge Orders        Ordered    diazepam (VALIUM) 5 MG tablet  2 times daily     09/29/17 1403       [name], MD 09/29/17 1404    [name], MD 09/29/17 1530

## 2017-09-29 NOTE — ED Triage Notes (Addendum)
PT BIB EMS C/O WEAKNESS AND LOSS OF APPETITE X3 DAYS. NAUSEA W/O VOMITING. PER EMS, HE RAN OUT OF HIS ANXIETY MEDS X 3 DAYS, AS WELL. PT IS ALSO C/O ABDOMINAL PAIN 2/10. PT ALSO C/O SEVERE ANXIETY. PT STS HE RAN OUT OF LORAZEPAM, AND HAD SIMILAR SYMPTOMS WHEN HE RAN OUT BEFORE.

## 2017-10-01 ENCOUNTER — Encounter (HOSPITAL_COMMUNITY): Payer: Self-pay

## 2017-10-01 ENCOUNTER — Emergency Department (HOSPITAL_COMMUNITY)
Admission: EM | Admit: 2017-10-01 | Discharge: 2017-10-01 | Disposition: A | Discharge status: Home / Self Care | Payer: Medicaid Other | Attending: Emergency Medicine | Admitting: Emergency Medicine

## 2017-10-01 ENCOUNTER — Other Ambulatory Visit: Payer: Self-pay

## 2017-10-01 ENCOUNTER — Emergency Department (HOSPITAL_COMMUNITY): Payer: Medicaid Other

## 2017-10-01 DIAGNOSIS — F10930 Alcohol use, unspecified with withdrawal, uncomplicated: Secondary | ICD-10-CM

## 2017-10-01 DIAGNOSIS — F0390 Unspecified dementia without behavioral disturbance: Secondary | ICD-10-CM | POA: Diagnosis not present

## 2017-10-01 DIAGNOSIS — F419 Anxiety disorder, unspecified: Secondary | ICD-10-CM | POA: Insufficient documentation

## 2017-10-01 DIAGNOSIS — I10 Essential (primary) hypertension: Secondary | ICD-10-CM | POA: Insufficient documentation

## 2017-10-01 DIAGNOSIS — F10230 Alcohol dependence with withdrawal, uncomplicated: Secondary | ICD-10-CM

## 2017-10-01 DIAGNOSIS — Z79899 Other long term (current) drug therapy: Secondary | ICD-10-CM | POA: Diagnosis not present

## 2017-10-01 DIAGNOSIS — Z87891 Personal history of nicotine dependence: Secondary | ICD-10-CM | POA: Insufficient documentation

## 2017-10-01 LAB — BASIC METABOLIC PANEL
Anion gap: 11 (ref 5–15)
BUN: <5 mg/dL — ABNORMAL LOW (ref 6–20)
CHLORIDE: 97 mmol/L — AB (ref 101–111)
CO2: 26 mmol/L (ref 22–32)
CREATININE: 0.71 mg/dL (ref 0.61–1.24)
Calcium: 9.2 mg/dL (ref 8.9–10.3)
GFR calc non Af Amer: >60 mL/min (ref >60)
Glucose, Bld: 103 mg/dL — ABNORMAL HIGH (ref 65–99)
POTASSIUM: 4.2 mmol/L (ref 3.5–5.1)
Sodium: 134 mmol/L — ABNORMAL LOW (ref 135–145)

## 2017-10-01 LAB — I-STAT TROPONIN, ED: Troponin i, poc: 0 ng/mL (ref 0.00–0.08)

## 2017-10-01 LAB — CBC
HCT: 46.5 % (ref 39.0–52.0)
Hemoglobin: 15.7 g/dL (ref 13.0–17.0)
MCH: 31.3 pg (ref 26.0–34.0)
MCHC: 33.8 g/dL (ref 30.0–36.0)
MCV: 92.8 fL (ref 78.0–100.0)
PLATELETS: 366 10*3/uL (ref 150–400)
RBC: 5.01 MIL/uL (ref 4.22–5.81)
RDW: 12.9 % (ref 11.5–15.5)
WBC: 12.3 10*3/uL — AB (ref 4.0–10.5)

## 2017-10-01 MED ORDER — LORAZEPAM 1 MG PO TABS
1.0000 mg | ORAL_TABLET | Freq: Once | ORAL | Status: AC
  Administered 2017-10-01: 1 mg via ORAL
  Filled 2017-10-01: qty 1

## 2017-10-01 MED ORDER — SODIUM CHLORIDE 0.9 % IV BOLUS
1000.0000 mL | Freq: Once | INTRAVENOUS | Status: AC
  Administered 2017-10-01: 1000 mL via INTRAVENOUS

## 2017-10-01 MED ORDER — CHLORDIAZEPOXIDE HCL 25 MG PO CAPS: ORAL_CAPSULE | ORAL | 0 refills | Status: DC

## 2017-10-01 NOTE — ED Notes (Signed)
Patient requested to speak to provider again before leaving. Dr. Marcha Dutton notified and will go speak to patient.

## 2017-10-01 NOTE — ED Triage Notes (Addendum)
Patient reports that he is out of his anti anxiety medication(Lorazepam) due to financial reasons. Patient also reports headache, loss of appetite, and left and mid chest pain that radiates to his back x 5 days.  Patient reports that he normally drinks daily, but has not drank any alcohol for the past 2 days.

## 2017-10-01 NOTE — ED Provider Notes (Signed)
[facility] DEPT Provider Note   CSN: [id number] Arrival date & time: 10/01/17  1011     History   Chief Complaint Chief Complaint  Patient presents with  . Chest Pain  . Anxiety    HPI Anthony Lamb is a 62 y.o. male.  HPI  Patient with history of anxiety, alcohol abuse, chronic pain, GERD presents with concern for anxiety and feeling shaky after running out of his Ativan 3 days ago.  He also states that since he was feeling badly he decided to stop drinking alcohol.  He states his last drink was 3 nights ago.  Over the past several days he has been very anxious and feeling jittery.  He has no chest pain or shortness of breath.  He has had no vomiting.  He states he feels dehydrated because he has not been drinking well.  He states he has no transportation to get to his doctor's office so he is not called to make an appointment.  He was seen over the weekend in the ED and given a dose of Ativan at that time.  He has had no seizure activity or confusion.  He has no fever or chills.  There are no other associated systemic symptoms, there are no other alleviating or modifying factors.   Past Medical History:  Diagnosis Date  . Alcohol abuse, in remission   . Anemia   . Ankylosing spondylitis ([place])   . Anxiety   . Chronic pain   . Colitis   . Depression   . Esophagitis   . Gastritis   . GERD (gastroesophageal reflux disease)   . Hypertension   . IBS (irritable bowel syndrome)   . Poor dentition   . SIADH (syndrome of inappropriate ADH production) [facility])     Patient Active Problem List   Diagnosis Date Noted  . IBS (irritable bowel syndrome)   . Hypertension   . Colitis   . Alcohol abuse, in remission   . Adjustment disorder with mixed anxiety and depressed mood 05/11/2017  . Dehydration 12/13/2016  . Alcohol abuse   . SOB (shortness of breath)   . Chest pain 11/11/2016  . Anxiety   . Gastroesophageal reflux disease   . Osteopenia  determined by x-ray 08/06/2014  . Stress fracture of calcaneus 08/06/2014  . Vitamin D deficiency 12/18/2013  . Dementia 12/18/2013  . Benign microscopic hematuria 07/06/2013  . SIADH (syndrome of inappropriate ADH production) ([place]) 06/22/2013  . Hyponatremia 06/20/2013  . Ankylosing spondylitis ([place]) 06/20/2013  . Malnutrition of moderate degree ([place]) 06/20/2013  . BPH (benign prostatic hyperplasia) 08/22/2010  . Backache 04/19/2010  . ALCOHOLISM 08/13/2008  . History of colonic polyps 08/13/2008  . Essential hypertension 07/03/2007  . PUD (peptic ulcer disease) 07/03/2007  . Irritable bowel syndrome 07/03/2007    Past Surgical History:  Procedure Laterality Date  . COLONOSCOPY W/ BIOPSIES    . ESOPHAGOGASTRODUODENOSCOPY    . HERNIA REPAIR Bilateral   . TONSILLECTOMY          Home Medications    Prior to Admission medications   Medication Sig Start Date End Date Taking? Authorizing Provider  acetaminophen (TYLENOL) 500 MG tablet Take 1,000 mg by mouth every 6 (six) hours as needed (For headache and back pain.).    Yes [provider]  Cholecalciferol (VITAMIN D) 2000 units CAPS Take 4,000 Units by mouth daily.    Yes [provider]  diazepam (VALIUM) 5 MG tablet Take 1 tablet (  5 mg total) by mouth 2 (two) times daily. 09/29/17  Yes [name], MD  doxepin (SINEQUAN) 25 MG capsule Take 25 mg by mouth at bedtime. 09/26/17  Yes [provider]  ferrous sulfate 325 (65 FE) MG tablet Take 325 mg by mouth daily with breakfast.   Yes [provider]  lisinopril (PRINIVIL,ZESTRIL) 10 MG tablet Take 1 tablet (10 mg total) by mouth daily. Patient taking differently: Take 10 mg by mouth every evening.  08/21/17 10/01/17 Yes Lacroce, Hulen Shouts, MD  LORazepam (ATIVAN) 0.5 MG tablet Take 0.5 mg by mouth 3 (three) times daily as needed for anxiety.   Yes [provider]  Multiple Vitamins-Minerals (MENS ONE DAILY PO) Take 1 tablet by mouth  daily.   Yes [provider]  pantoprazole (PROTONIX) 40 MG tablet Take 1 tablet (40 mg total) by mouth daily. Patient taking differently: Take 40 mg by mouth daily with breakfast.  04/24/14  Yes Janith Lima, MD  sodium chloride (OCEAN) 0.65 % SOLN nasal spray Place 1 spray into both nostrils as needed for congestion.   Yes [provider]  sucralfate (CARAFATE) 1 G tablet Take 1 tablet (1 g total) by mouth 4 (four) times daily -  with meals and at bedtime. Chew before swallowing Patient taking differently: Take 1 g by mouth 4 (four) times daily. Chew before swallowing 12/17/13  Yes Janith Lima, MD  traZODone (DESYREL) 100 MG tablet Take 100 mg by mouth at bedtime. 09/03/17  Yes [provider]  verapamil (CALAN-SR) 240 MG CR tablet Take 240 mg by mouth at bedtime. 02/09/17  Yes [provider]  zolpidem (AMBIEN) 10 MG tablet Take 1 tablet (10 mg total) by mouth at bedtime as needed for sleep. Patient taking differently: Take 10 mg by mouth at bedtime.  05/13/17 10/01/17 Yes Khatri, Hina, PA-C  chlordiazePOXIDE (LIBRIUM) 25 MG capsule 37m PO TID x 1D, then 25-528mPO BID X 1D, then 25-5078mO QD X 1D 10/01/17   Mabe, MarForbes CellarD  hydrOXYzine (ATARAX/VISTARIL) 25 MG tablet Take 1 tablet (25 mg total) by mouth every 6 (six) hours as needed for anxiety. Patient not taking: Reported on 10/01/2017 06/11/17   Robinson, JorMartinique PA-C  ondansetron (ZOFRAN ODT) 4 MG disintegrating tablet 4mg105mT q4 hours prn nausea/vomit Patient not taking: Reported on 10/01/2017 09/19/17   FloyDeno Etienne    Family History Family History  Problem Relation Age of Onset  . Anxiety disorder Mother   . Congestive Heart Failure Mother   . Crohn's disease Mother   . Colon cancer Mother 60  32Arthritis Father   . High blood pressure Father     Social History Social History   Tobacco Use  . Smoking status: Former Smoker    Types: Cigarettes  . Smokeless tobacco: Never Used    Substance Use Topics  . Alcohol use: Yes    Comment: daily/ 3-4 beers  . Drug use: No     Allergies   Diphenhydramine hcl; Flexeril [cyclobenzaprine]; Hctz [hydrochlorothiazide]; and Nsaids   Review of Systems Review of Systems  ROS reviewed and all otherwise negative except for mentioned in HPI   Physical Exam Updated Vital Signs BP (!) 164/83   Pulse 80   Temp 98.1 F (36.7 C) (Oral)   Resp 18   Ht 5' 11"  (1.803 m)   Wt 80.3 kg (177 lb)   SpO2 98%   BMI 24.69 kg/m  Vitals reviewed Physical Exam  Physical Examination: General appearance - alert, well appearing, and in no distress Mental status - alert, oriented to person, place, and time Eyes - no conjunctival injection, no scleral icterus Mouth - mucous membranes moist, pharynx normal without lesions, poor dentition Neck - supple, no significant adenopathy Chest - clear to auscultation, no wheezes, rales or rhonchi, symmetric air entry Heart - normal rate, regular rhythm, normal S1, S2, no murmurs, rubs, clicks or gallops Abdomen - soft, nontender, nondistended, no masses or organomegaly Neurological - alert, oriented x 3, normal speech, strength 5/5 in extremities x 4, sensation intact, no active tremor noted on my exam Extremities - peripheral pulses normal, no pedal edema, no clubbing or cyanosis Skin - normal coloration and turgor, no rashes   ED Treatments / Results  Labs (all labs ordered are listed, but only abnormal results are displayed) Labs Reviewed  BASIC METABOLIC PANEL - Abnormal; Notable for the following components:      Result Value   Sodium 134 (*)    Chloride 97 (*)    Glucose, Bld 103 (*)    BUN <5 (*)    All other components within normal limits  CBC - Abnormal; Notable for the following components:   WBC 12.3 (*)    All other components within normal limits  I-STAT TROPONIN, ED    EKG EKG Interpretation  Date/Time:  Monday October 01 2017 10:18:40 EDT Ventricular Rate:  70 PR  Interval:    QRS Duration: 105 QT Interval:  415 QTC Calculation: 448 R Axis:   71 Text Interpretation:  Sinus rhythm Atrial premature complex RSR' in V1 or V2, right VCD or RVH Baseline wander in lead(s) V2 Since previous tracing PACs are new Confirmed by Alfonzo Beers [phone]) on 10/01/2017 5:35:59 PM   Radiology Dg Chest 2 View  Result Date: 10/01/2017 CLINICAL DATA:  Chest pain and shortness of breath EXAM: CHEST - 2 VIEW COMPARISON:  September 29, 2017 FINDINGS: There is mild left base atelectasis. Lungs elsewhere are clear. The heart size and pulmonary vascularity are normal. No adenopathy. There is aortic atherosclerosis. There are apparent syndesmophyte at changes in the thoracic spine. IMPRESSION: Mild left base atelectasis. No edema or consolidation. Heart size normal. There is aortic atherosclerosis. There is syndesmophytic changes in the thoracic spine. Question underlying seronegative spondyloarthropathy. Aortic Atherosclerosis (ICD10-I70.0). Electronically Signed   By: Lowella Grip III M.D.   On: 10/01/2017 10:55    Procedures Procedures (including critical care time)  Medications Ordered in ED Medications  LORazepam (ATIVAN) tablet 1 mg (1 mg Oral Given 10/01/17 1934)  sodium chloride 0.9 % bolus 1,000 mL (0 mLs Intravenous Stopped 10/01/17 2034)     Initial Impression / Assessment and Plan / ED Course  I have reviewed the triage vital signs and the nursing notes.  Pertinent labs & imaging results that were available during my care of the patient were reviewed by me and considered in my medical decision making (see chart for details).    Patient presents with complaint of running out of his anxiety medication, Ativan, as well as feeling shaky and jittery after stopping drinking alcohol at the same time.  He states he has not been drinking fluids well and feels dehydrated.  He has had no seizure activity or confusion.  On exam he appears mildly dry but is oriented x3, no  active tremor.  On my count his CIWA score is 2.  Patient was given Ativan in the ED.  Per note at last visit  this weekend he had 90 prescription tablets for Ativan recently so no further Ativan prescription given.  I have encouraged him to call his doctor's office for an appointment, call tomorrow to begin the arrangement for the appointment.  Discharged with strict return precautions.  Pt agreeable with plan.  Final Clinical Impressions(s) / ED Diagnoses   Final diagnoses:  Anxiety  Alcohol withdrawal syndrome without complication [facility])    ED Discharge Orders        Ordered    chlordiazePOXIDE (LIBRIUM) 25 MG capsule     10/01/17 2054       Pixie Casino, MD 10/01/17 2312

## 2017-10-01 NOTE — Discharge Instructions (Signed)
Return to the ED with any concerns including seizure activity, fainting, chest pain or difficulty breathing, vomiting and not able to keep down liquids, decreased level of alertness/lethargy, or any other alarming symptoms

## 2017-10-19 ENCOUNTER — Emergency Department (HOSPITAL_COMMUNITY)
Admission: EM | Admit: 2017-10-19 | Discharge: 2017-10-19 | Disposition: A | Discharge status: Home / Self Care | Payer: Medicaid Other | Attending: Emergency Medicine | Admitting: Emergency Medicine

## 2017-10-19 ENCOUNTER — Emergency Department (HOSPITAL_COMMUNITY): Payer: Medicaid Other

## 2017-10-19 DIAGNOSIS — F0390 Unspecified dementia without behavioral disturbance: Secondary | ICD-10-CM | POA: Insufficient documentation

## 2017-10-19 DIAGNOSIS — R51 Headache: Secondary | ICD-10-CM | POA: Diagnosis not present

## 2017-10-19 DIAGNOSIS — R519 Headache, unspecified: Secondary | ICD-10-CM

## 2017-10-19 DIAGNOSIS — R05 Cough: Secondary | ICD-10-CM | POA: Insufficient documentation

## 2017-10-19 DIAGNOSIS — Z87891 Personal history of nicotine dependence: Secondary | ICD-10-CM | POA: Diagnosis not present

## 2017-10-19 DIAGNOSIS — R197 Diarrhea, unspecified: Secondary | ICD-10-CM | POA: Insufficient documentation

## 2017-10-19 DIAGNOSIS — R0982 Postnasal drip: Secondary | ICD-10-CM | POA: Insufficient documentation

## 2017-10-19 DIAGNOSIS — Z79899 Other long term (current) drug therapy: Secondary | ICD-10-CM | POA: Insufficient documentation

## 2017-10-19 DIAGNOSIS — R059 Cough, unspecified: Secondary | ICD-10-CM

## 2017-10-19 DIAGNOSIS — I10 Essential (primary) hypertension: Secondary | ICD-10-CM | POA: Diagnosis not present

## 2017-10-19 LAB — CBC WITH DIFFERENTIAL/PLATELET
Basophils Absolute: 0 10*3/uL (ref 0.0–0.1)
Basophils Relative: 0 %
Eosinophils Absolute: 0.1 10*3/uL (ref 0.0–0.7)
Eosinophils Relative: 1 %
HEMATOCRIT: 41.4 % (ref 39.0–52.0)
HEMOGLOBIN: 14 g/dL (ref 13.0–17.0)
LYMPHS ABS: 1.8 10*3/uL (ref 0.7–4.0)
Lymphocytes Relative: 16 %
MCH: 30.9 pg (ref 26.0–34.0)
MCHC: 33.8 g/dL (ref 30.0–36.0)
MCV: 91.4 fL (ref 78.0–100.0)
MONO ABS: 1.4 10*3/uL — AB (ref 0.1–1.0)
MONOS PCT: 13 %
NEUTROS ABS: 7.7 10*3/uL (ref 1.7–7.7)
NEUTROS PCT: 70 %
Platelets: 360 10*3/uL (ref 150–400)
RBC: 4.53 MIL/uL (ref 4.22–5.81)
RDW: 13.2 % (ref 11.5–15.5)
WBC: 11 10*3/uL — ABNORMAL HIGH (ref 4.0–10.5)

## 2017-10-19 LAB — BASIC METABOLIC PANEL
Anion gap: 11 (ref 5–15)
CALCIUM: 9 mg/dL (ref 8.9–10.3)
CHLORIDE: 100 mmol/L — AB (ref 101–111)
CO2: 23 mmol/L (ref 22–32)
CREATININE: 0.7 mg/dL (ref 0.61–1.24)
GFR calc Af Amer: >60 mL/min (ref >60)
GFR calc non Af Amer: >60 mL/min (ref >60)
GLUCOSE: 114 mg/dL — AB (ref 65–99)
Potassium: 3.5 mmol/L (ref 3.5–5.1)
Sodium: 134 mmol/L — ABNORMAL LOW (ref 135–145)

## 2017-10-19 MED ORDER — ACETAMINOPHEN 325 MG PO TABS
650.0000 mg | ORAL_TABLET | Freq: Once | ORAL | Status: AC
  Administered 2017-10-19: 650 mg via ORAL
  Filled 2017-10-19: qty 2

## 2017-10-19 MED ORDER — SODIUM CHLORIDE 0.9 % IV SOLN: INTRAVENOUS | Status: DC

## 2017-10-19 MED ORDER — SODIUM CHLORIDE 0.9 % IV BOLUS
1000.0000 mL | Freq: Once | INTRAVENOUS | Status: AC
  Administered 2017-10-19: 1000 mL via INTRAVENOUS

## 2017-10-19 MED ORDER — LORAZEPAM 0.5 MG PO TABS
0.5000 mg | ORAL_TABLET | Freq: Once | ORAL | Status: AC
  Administered 2017-10-19: 0.5 mg via ORAL
  Filled 2017-10-19: qty 1

## 2017-10-19 MED ORDER — AMOXICILLIN-POT CLAVULANATE 875-125 MG PO TABS: 1.0000 | ORAL_TABLET | Freq: Two times a day (BID) | ORAL | 0 refills | Status: DC

## 2017-10-19 MED ORDER — AMOXICILLIN-POT CLAVULANATE 875-125 MG PO TABS
1.0000 | ORAL_TABLET | Freq: Once | ORAL | Status: AC
  Administered 2017-10-19: 1 via ORAL
  Filled 2017-10-19: qty 1

## 2017-10-19 NOTE — Discharge Instructions (Addendum)
Return here for fever, trouble with your vision in your left eye, severe facial swelling, or any other problems

## 2017-10-19 NOTE — ED Provider Notes (Signed)
[facility] DEPT Provider Note   CSN: [id number] Arrival date & time: 10/19/17  4709     History   Chief Complaint Chief Complaint  Patient presents with  . Facial Pain    HPI MOSS BERRY is a 62 y.o. male.  62 year old male presents with left-sided facial discomfort times several days despite being on antibiotics for bronchitis.  The facial swelling is localized around his eye but he denies any visual changes.  Has had postnasal drip and some left-sided maxillary sinus pressure.  Has had nonproductive cough without recorded fever.  Watery diarrhea without emesis.  Notes increased anxiety as well.  Seen 2 weeks ago for similar symptoms.     Past Medical History:  Diagnosis Date  . Alcohol abuse, in remission   . Anemia   . Ankylosing spondylitis ([place])   . Anxiety   . Chronic pain   . Colitis   . Depression   . Esophagitis   . Gastritis   . GERD (gastroesophageal reflux disease)   . Hypertension   . IBS (irritable bowel syndrome)   . Poor dentition   . SIADH (syndrome of inappropriate ADH production) [facility])     Patient Active Problem List   Diagnosis Date Noted  . IBS (irritable bowel syndrome)   . Hypertension   . Colitis   . Alcohol abuse, in remission   . Adjustment disorder with mixed anxiety and depressed mood 05/11/2017  . Dehydration 12/13/2016  . Alcohol abuse   . SOB (shortness of breath)   . Chest pain 11/11/2016  . Anxiety   . Gastroesophageal reflux disease   . Osteopenia determined by x-ray 08/06/2014  . Stress fracture of calcaneus 08/06/2014  . Vitamin D deficiency 12/18/2013  . Dementia 12/18/2013  . Benign microscopic hematuria 07/06/2013  . SIADH (syndrome of inappropriate ADH production) ([place]) 06/22/2013  . Hyponatremia 06/20/2013  . Ankylosing spondylitis ([place]) 06/20/2013  . Malnutrition of moderate degree ([place]) 06/20/2013  . BPH (benign prostatic hyperplasia) 08/22/2010  . Backache 04/19/2010  .  ALCOHOLISM 08/13/2008  . History of colonic polyps 08/13/2008  . Essential hypertension 07/03/2007  . PUD (peptic ulcer disease) 07/03/2007  . Irritable bowel syndrome 07/03/2007    Past Surgical History:  Procedure Laterality Date  . COLONOSCOPY W/ BIOPSIES    . ESOPHAGOGASTRODUODENOSCOPY    . HERNIA REPAIR Bilateral   . TONSILLECTOMY          Home Medications    Prior to Admission medications   Medication Sig Start Date End Date Taking? Authorizing Provider  acetaminophen (TYLENOL) 500 MG tablet Take 1,000 mg by mouth every 6 (six) hours as needed (For headache and back pain.).     [provider]  chlordiazePOXIDE (LIBRIUM) 25 MG capsule 15m PO TID x 1D, then 25-531mPO BID X 1D, then 25-5023mO QD X 1D 10/01/17   Mabe, MarForbes CellarD  Cholecalciferol (VITAMIN D) 2000 units CAPS Take 4,000 Units by mouth daily.     [provider]  diazepam (VALIUM) 5 MG tablet Take 1 tablet (5 mg total) by mouth 2 (two) times daily. 09/29/17   HavIsla PenceD  doxepin (SINEQUAN) 25 MG capsule Take 25 mg by mouth at bedtime. 09/26/17   [provider]  ferrous sulfate 325 (65 FE) MG tablet Take 325 mg by mouth daily with breakfast.    [provider]  hydrOXYzine (ATARAX/VISTARIL) 25 MG tablet Take 1 tablet (25 mg total) by mouth every 6 (six)  hours as needed for anxiety. Patient not taking: Reported on 10/01/2017 06/11/17   Robinson, Martin[place] N, PA-C  lisinopril (PRINIVIL,ZESTRIL) 10 MG tablet Take 1 tablet (10 mg total) by mouth daily. Patient taking differently: Take 10 mg by mouth every evening.  08/21/17 10/01/17  Lacroce, Hulen Shouts, MD  LORazepam (ATIVAN) 0.5 MG tablet Take 0.5 mg by mouth 3 (three) times daily as needed for anxiety.    [provider]  Multiple Vitamins-Minerals (MENS ONE DAILY PO) Take 1 tablet by mouth daily.    [provider]  ondansetron (ZOFRAN ODT) 4 MG disintegrating tablet 1m ODT q4 hours prn nausea/vomit Patient  not taking: Reported on 10/01/2017 09/19/17   FDeno Etienne DO  pantoprazole (PROTONIX) 40 MG tablet Take 1 tablet (40 mg total) by mouth daily. Patient taking differently: Take 40 mg by mouth daily with breakfast.  04/24/14   JJanith Lima MD  sodium chloride (OCEAN) 0.65 % SOLN nasal spray Place 1 spray into both nostrils as needed for congestion.    [provider]  sucralfate (CARAFATE) 1 G tablet Take 1 tablet (1 g total) by mouth 4 (four) times daily -  with meals and at bedtime. Chew before swallowing Patient taking differently: Take 1 g by mouth 4 (four) times daily. Chew before swallowing 12/17/13   JJanith Lima MD  traZODone (DESYREL) 100 MG tablet Take 100 mg by mouth at bedtime. 09/03/17   [provider]  verapamil (CALAN-SR) 240 MG CR tablet Take 240 mg by mouth at bedtime. 02/09/17   [provider]  zolpidem (AMBIEN) 10 MG tablet Take 1 tablet (10 mg total) by mouth at bedtime as needed for sleep. Patient taking differently: Take 10 mg by mouth at bedtime.  05/13/17 10/01/17  KDelia Heady PA-C    Family History Family History  Problem Relation Age of Onset  . Anxiety disorder Mother   . Congestive Heart Failure Mother   . Crohn's disease Mother   . Colon cancer Mother 616 . Arthritis Father   . High blood pressure Father     Social History Social History   Tobacco Use  . Smoking status: Former Smoker    Types: Cigarettes  . Smokeless tobacco: Never Used  Substance Use Topics  . Alcohol use: Yes    Comment: daily/ 3-4 beers  . Drug use: No     Allergies   Diphenhydramine hcl; Flexeril [cyclobenzaprine]; Hctz [hydrochlorothiazide]; and Nsaids   Review of Systems Review of Systems  All other systems reviewed and are negative.    Physical Exam Updated Vital Signs BP (!) 151/80 (BP Location: Left Arm)   Pulse 73   Temp 98 F (36.7 C)   Resp 17   SpO2 97%   Physical Exam  Constitutional: He is oriented to person, place, and  time. He appears well-developed and well-nourished.  Non-toxic appearance. No distress.  HENT:  Head: Normocephalic and atraumatic.    No proptosis appreciated.  Extraocular muscles are intact.  No preseptal orbital swelling noted.  Eyes: Pupils are equal, round, and reactive to light. Conjunctivae, EOM and lids are normal.  Neck: Normal range of motion. Neck supple. No tracheal deviation present. No thyroid mass present.  Cardiovascular: Normal rate, regular rhythm and normal heart sounds. Exam reveals no gallop.  No murmur heard. Pulmonary/Chest: Effort normal and breath sounds normal. No stridor. No respiratory distress. He has no decreased breath sounds. He has no wheezes. He has no rhonchi. He has no rales.  Abdominal: Soft. Normal appearance and bowel sounds are normal. He exhibits no distension. There is no tenderness. There is no rebound and no CVA tenderness.  Musculoskeletal: Normal range of motion. He exhibits no edema or tenderness.  Neurological: He is alert and oriented to person, place, and time. He has normal strength. No cranial nerve deficit or sensory deficit. GCS eye subscore is 4. GCS verbal subscore is 5. GCS motor subscore is 6.  Skin: Skin is warm and dry. No abrasion and no rash noted.  Psychiatric: His speech is normal and behavior is normal. His mood appears anxious.  Nursing note and vitals reviewed.    ED Treatments / Results  Labs (all labs ordered are listed, but only abnormal results are displayed) Labs Reviewed  CBC WITH DIFFERENTIAL/PLATELET  BASIC METABOLIC PANEL    EKG None  Radiology No results found.  Procedures Procedures (including critical care time)  Medications Ordered in ED Medications  0.9 %  sodium chloride infusion (has no administration in time range)  sodium chloride 0.9 % bolus 1,000 mL (has no administration in time range)  acetaminophen (TYLENOL) tablet 650 mg (has no administration in time range)  LORazepam (ATIVAN) tablet  0.5 mg (has no administration in time range)     Initial Impression / Assessment and Plan / ED Course  I have reviewed the triage vital signs and the nursing notes.  Pertinent labs & imaging results that were available during my care of the patient were reviewed by me and considered in my medical decision making (see chart for details).     Patient complains of several days of sinusitis type symptoms.  Has no evidence of preseptal or septal cellulitis of the left orbit.  Does have some mild swelling around the patient's left maxillary region.  Given dose of Augmentin here and will prescribe same.  Final Clinical Impressions(s) / ED Diagnoses   Final diagnoses:  Cough    ED Discharge Orders    None       Lacretia Leigh, MD 10/19/17 1110

## 2017-10-19 NOTE — ED Triage Notes (Addendum)
Transported by [facility] from home--pt reports sinus problems x 2 weeks--headache, facial pain and left eye swelling. Recently diagnosed with bronchitis 1 week ago. Pt also reports decrease in PO appetite, night sweats and bloody stools.

## 2017-10-19 NOTE — ED Notes (Signed)
Bed: K[facility]57 Expected date:  Expected time:  Means of arrival:  Comments: Ems-FACIAL SWELLING

## 2017-10-19 NOTE — ED Notes (Signed)
ED Provider at bedside. 

## 2017-11-12 ENCOUNTER — Encounter (HOSPITAL_COMMUNITY): Payer: Self-pay | Admitting: *Deleted

## 2017-11-12 ENCOUNTER — Emergency Department (HOSPITAL_COMMUNITY)
Admission: EM | Admit: 2017-11-12 | Discharge: 2017-11-12 | Disposition: A | Discharge status: Home / Self Care | Payer: Medicaid Other | Attending: Emergency Medicine | Admitting: Emergency Medicine

## 2017-11-12 DIAGNOSIS — Z87891 Personal history of nicotine dependence: Secondary | ICD-10-CM | POA: Insufficient documentation

## 2017-11-12 DIAGNOSIS — R112 Nausea with vomiting, unspecified: Secondary | ICD-10-CM | POA: Insufficient documentation

## 2017-11-12 DIAGNOSIS — I10 Essential (primary) hypertension: Secondary | ICD-10-CM | POA: Insufficient documentation

## 2017-11-12 DIAGNOSIS — Z79899 Other long term (current) drug therapy: Secondary | ICD-10-CM | POA: Insufficient documentation

## 2017-11-12 DIAGNOSIS — E860 Dehydration: Secondary | ICD-10-CM

## 2017-11-12 DIAGNOSIS — R110 Nausea: Secondary | ICD-10-CM

## 2017-11-12 LAB — COMPREHENSIVE METABOLIC PANEL
ALBUMIN: 4 g/dL (ref 3.5–5.0)
ALT: 17 U/L (ref 17–63)
ANION GAP: 10 (ref 5–15)
AST: 33 U/L (ref 15–41)
Alkaline Phosphatase: 88 U/L (ref 38–126)
BILIRUBIN TOTAL: 1.5 mg/dL — AB (ref 0.3–1.2)
BUN: 5 mg/dL — ABNORMAL LOW (ref 6–20)
CALCIUM: 8.7 mg/dL — AB (ref 8.9–10.3)
CO2: 26 mmol/L (ref 22–32)
Chloride: 92 mmol/L — ABNORMAL LOW (ref 101–111)
Creatinine, Ser: 0.72 mg/dL (ref 0.61–1.24)
GFR calc Af Amer: >60 mL/min (ref >60)
GFR calc non Af Amer: >60 mL/min (ref >60)
GLUCOSE: 106 mg/dL — AB (ref 65–99)
POTASSIUM: 4.3 mmol/L (ref 3.5–5.1)
SODIUM: 128 mmol/L — AB (ref 135–145)
TOTAL PROTEIN: 7.1 g/dL (ref 6.5–8.1)

## 2017-11-12 LAB — CBC WITH DIFFERENTIAL/PLATELET
BASOS ABS: 0 10*3/uL (ref 0.0–0.1)
BASOS PCT: 0 %
EOS ABS: 0 10*3/uL (ref 0.0–0.7)
Eosinophils Relative: 0 %
HCT: 41.3 % (ref 39.0–52.0)
HEMOGLOBIN: 14.1 g/dL (ref 13.0–17.0)
Lymphocytes Relative: 13 %
Lymphs Abs: 1.2 10*3/uL (ref 0.7–4.0)
MCH: 31.1 pg (ref 26.0–34.0)
MCHC: 34.1 g/dL (ref 30.0–36.0)
MCV: 91 fL (ref 78.0–100.0)
MONOS PCT: 10 %
Monocytes Absolute: 0.8 10*3/uL (ref 0.1–1.0)
NEUTROS PCT: 77 %
Neutro Abs: 6.7 10*3/uL (ref 1.7–7.7)
Platelets: 273 10*3/uL (ref 150–400)
RBC: 4.54 MIL/uL (ref 4.22–5.81)
RDW: 13.7 % (ref 11.5–15.5)
WBC: 8.8 10*3/uL (ref 4.0–10.5)

## 2017-11-12 LAB — LIPASE, BLOOD: Lipase: 26 U/L (ref 11–51)

## 2017-11-12 MED ORDER — SODIUM CHLORIDE 0.9 % IV BOLUS
500.0000 mL | Freq: Once | INTRAVENOUS | Status: AC
  Administered 2017-11-12: 500 mL via INTRAVENOUS

## 2017-11-12 MED ORDER — LORAZEPAM 2 MG/ML IJ SOLN
0.5000 mg | Freq: Once | INTRAMUSCULAR | Status: AC
  Administered 2017-11-12: 0.5 mg via INTRAVENOUS
  Filled 2017-11-12: qty 1

## 2017-11-12 MED ORDER — ONDANSETRON HCL 4 MG/2ML IJ SOLN
4.0000 mg | Freq: Once | INTRAMUSCULAR | Status: AC
  Administered 2017-11-12: 4 mg via INTRAVENOUS
  Filled 2017-11-12: qty 2

## 2017-11-12 MED ORDER — KETOROLAC TROMETHAMINE 15 MG/ML IJ SOLN
15.0000 mg | Freq: Once | INTRAMUSCULAR | Status: AC
  Administered 2017-11-12: 15 mg via INTRAVENOUS
  Filled 2017-11-12: qty 1

## 2017-11-12 MED ORDER — SODIUM CHLORIDE 0.9 % IV BOLUS
1000.0000 mL | Freq: Once | INTRAVENOUS | Status: AC
  Administered 2017-11-12: 1000 mL via INTRAVENOUS

## 2017-11-12 NOTE — Discharge Instructions (Addendum)
Please return for any problem.  Follow-up with your regular doctor within the next 3 to 4 days as instructed.  Drink plenty of fluids.

## 2017-11-12 NOTE — ED Provider Notes (Signed)
[name] DEPT Provider Note   CSN: [id number] Arrival date & time: 11/12/17  0756     History   Chief Complaint Chief Complaint  Patient presents with  . Nausea  . Emesis    HPI Anthony Lamb is a 62 y.o. male.  62 year old male with prior history of anxiety, colitis, GERD, gastritis, and hypertension presents with complaint of feeling dehydrated.  Patient reports that over the weekend he does not feel that he has been drinking enough fluids.  He reports mild nausea. He is concerned that he may be dehydrated.  He denies abdominal pain.  He denies fever, vomiting, chest pain, shortness of breath, or other acute complaint.   The history is provided by the patient and medical records.  Illness  This is a new problem. The current episode started 2 days ago. The problem occurs constantly. The problem has not changed since onset.Pertinent negatives include no chest pain, no abdominal pain, no headaches and no shortness of breath. Nothing aggravates the symptoms. Nothing relieves the symptoms. He has tried nothing for the symptoms.    Past Medical History:  Diagnosis Date  . Alcohol abuse, in remission   . Anemia   . Ankylosing spondylitis ([place])   . Anxiety   . Chronic pain   . Colitis   . Depression   . Esophagitis   . Gastritis   . GERD (gastroesophageal reflux disease)   . Hypertension   . IBS (irritable bowel syndrome)   . Poor dentition   . SIADH (syndrome of inappropriate ADH production) [facility])     Patient Active Problem List   Diagnosis Date Noted  . IBS (irritable bowel syndrome)   . Hypertension   . Colitis   . Alcohol abuse, in remission   . Adjustment disorder with mixed anxiety and depressed mood 05/11/2017  . Dehydration 12/13/2016  . Alcohol abuse   . SOB (shortness of breath)   . Chest pain 11/11/2016  . Anxiety   . Gastroesophageal reflux disease   . Osteopenia determined by x-ray 08/06/2014  . Stress fracture of  calcaneus 08/06/2014  . Vitamin D deficiency 12/18/2013  . Dementia 12/18/2013  . Benign microscopic hematuria 07/06/2013  . SIADH (syndrome of inappropriate ADH production) ([place]) 06/22/2013  . Hyponatremia 06/20/2013  . Ankylosing spondylitis ([place]) 06/20/2013  . Malnutrition of moderate degree ([place]) 06/20/2013  . BPH (benign prostatic hyperplasia) 08/22/2010  . Backache 04/19/2010  . ALCOHOLISM 08/13/2008  . History of colonic polyps 08/13/2008  . Essential hypertension 07/03/2007  . PUD (peptic ulcer disease) 07/03/2007  . Irritable bowel syndrome 07/03/2007    Past Surgical History:  Procedure Laterality Date  . COLONOSCOPY W/ BIOPSIES    . ESOPHAGOGASTRODUODENOSCOPY    . HERNIA REPAIR Bilateral   . TONSILLECTOMY          Home Medications    Prior to Admission medications   Medication Sig Start Date End Date Taking? Authorizing Provider  acetaminophen (TYLENOL) 500 MG tablet Take 1,000 mg by mouth every 6 (six) hours as needed (For headache and back pain.).     [provider]  amoxicillin-clavulanate (AUGMENTIN) 875-125 MG tablet Take 1 tablet by mouth every 12 (twelve) hours. 10/19/17   Lacretia Leigh, MD  chlordiazePOXIDE (LIBRIUM) 25 MG capsule 72m PO TID x 1D, then 25-522mPO BID X 1D, then 25-5067mO QD X 1D Patient not taking: Reported on 10/19/2017 10/01/17   MabPixie CasinoD  Cholecalciferol (VITAMIN D) 2000 units CAPS  Take 4,000 Units by mouth daily.     [provider]  clonazePAM (KLONOPIN) 0.5 MG tablet Take 0.5 mg by mouth 3 (three) times daily as needed for anxiety. 10/04/17   [provider]  diazepam (VALIUM) 5 MG tablet Take 1 tablet (5 mg total) by mouth 2 (two) times daily. Patient not taking: Reported on 10/19/2017 09/29/17   [name], MD  ferrous sulfate 325 (65 FE) MG tablet Take 325 mg by mouth daily with breakfast.    [provider]  hydrOXYzine (ATARAX/VISTARIL) 25 MG tablet Take 1 tablet (25 mg total) by  mouth every 6 (six) hours as needed for anxiety. Patient not taking: Reported on 10/01/2017 06/11/17   Robinson, [place] N, PA-C  lisinopril (PRINIVIL,ZESTRIL) 10 MG tablet Take 1 tablet (10 mg total) by mouth daily. Patient taking differently: Take 10 mg by mouth every evening.  08/21/17 10/19/17  Lacroce, Hulen Shouts, MD  LORazepam (ATIVAN) 0.5 MG tablet Take 0.5 mg by mouth 3 (three) times daily as needed for anxiety.    [provider]  Multiple Vitamins-Minerals (MENS ONE DAILY PO) Take 1 tablet by mouth daily.    [provider]  ondansetron (ZOFRAN ODT) 4 MG disintegrating tablet 30m ODT q4 hours prn nausea/vomit Patient not taking: Reported on 10/01/2017 09/19/17   FDeno Etienne DO  pantoprazole (PROTONIX) 40 MG tablet Take 1 tablet (40 mg total) by mouth daily. 04/24/14   JJanith Lima MD  sodium chloride (OCEAN) 0.65 % SOLN nasal spray Place 1-2 sprays into both nostrils as needed for congestion.     [provider]  sucralfate (CARAFATE) 1 G tablet Take 1 tablet (1 g total) by mouth 4 (four) times daily -  with meals and at bedtime. Chew before swallowing Patient not taking: Reported on 10/19/2017 12/17/13   JJanith Lima MD  traZODone (DESYREL) 100 MG tablet Take 100 mg by mouth at bedtime as needed for sleep.  09/03/17   [provider]  verapamil (CALAN-SR) 240 MG CR tablet Take 240 mg by mouth at bedtime. 02/09/17   [provider]  zolpidem (AMBIEN) 10 MG tablet Take 1 tablet (10 mg total) by mouth at bedtime as needed for sleep. Patient taking differently: Take 10 mg by mouth at bedtime.  05/13/17 10/19/17  KDelia Heady PA-C    Family History Family History  Problem Relation Age of Onset  . Anxiety disorder Mother   . Congestive Heart Failure Mother   . Crohn's disease Mother   . Colon cancer Mother 653 . Arthritis Father   . High blood pressure Father     Social History Social History   Tobacco Use  . Smoking status: Former Smoker     Types: Cigarettes  . Smokeless tobacco: Never Used  Substance Use Topics  . Alcohol use: Yes    Comment: daily/ 3-4 beers  . Drug use: No     Allergies   Diphenhydramine hcl; Flexeril [cyclobenzaprine]; Hctz [hydrochlorothiazide]; and Nsaids   Review of Systems Review of Systems  Respiratory: Negative for shortness of breath.   Cardiovascular: Negative for chest pain.  Gastrointestinal: Negative for abdominal pain.  Neurological: Negative for headaches.  All other systems reviewed and are negative.    Physical Exam Updated Vital Signs BP (!) 164/90   Pulse 89   Temp 98.9 F (37.2 C)   Resp 20   SpO2 99%   Physical Exam  Constitutional: He is oriented to person, place, and time. He  appears well-developed and well-nourished. No distress.  HENT:  Head: Normocephalic and atraumatic.  Mouth/Throat: Oropharynx is clear and moist.  Eyes: Pupils are equal, round, and reactive to light. Conjunctivae and EOM are normal.  Neck: Normal range of motion. Neck supple.  Cardiovascular: Normal rate, regular rhythm and normal heart sounds.  Pulmonary/Chest: Effort normal and breath sounds normal. No respiratory distress.  Abdominal: Soft. He exhibits no distension. There is no tenderness.  Musculoskeletal: Normal range of motion. He exhibits no edema or deformity.  Neurological: He is alert and oriented to person, place, and time.  Skin: Skin is warm and dry.  Psychiatric: He has a normal mood and affect.  Nursing note and vitals reviewed.    ED Treatments / Results  Labs (all labs ordered are listed, but only abnormal results are displayed) Labs Reviewed  COMPREHENSIVE METABOLIC PANEL - Abnormal; Notable for the following components:      Result Value   Sodium 128 (*)    Chloride 92 (*)    Glucose, Bld 106 (*)    BUN 5 (*)    Calcium 8.7 (*)    Total Bilirubin 1.5 (*)    All other components within normal limits  LIPASE, BLOOD  CBC WITH DIFFERENTIAL/PLATELET  CBC WITH  DIFFERENTIAL/PLATELET    EKG None  Radiology No results found.  Procedures Procedures (including critical care time)  Medications Ordered in ED Medications  sodium chloride 0.9 % bolus 500 mL (has no administration in time range)  ondansetron (ZOFRAN) injection 4 mg (has no administration in time range)     Initial Impression / Assessment and Plan / ED Course  I have reviewed the triage vital signs and the nursing notes.  Pertinent labs & imaging results that were available during my care of the patient were reviewed by me and considered in my medical decision making (see chart for details).      1045   Patient is receiving IV fluids.  Patient appears improved.  He is taking p.o. well (a [place] sandwich).   MDM  Screen complete  Patient was presenting today for evaluation of possible dehydration.  Patient reported decreased p.o. intake.  During his evaluation the patient took good p.o. including ingesting a [place] sandwich without difficulty.  Screening labs are without significant abnormality other than a mildly decreased Na (128) which is not far from his baseline (low 130's).   Following IV fluid administration and a period of observation the patient apparently feels improved.  He declines further work-up or treatment.  He desires discharge home.  The importance of close follow-up with his regular care provider is stressed.  Strict return precautions are given and understood.  Final Clinical Impressions(s) / ED Diagnoses   Final diagnoses:  Nausea  Dehydration    ED Discharge Orders    None       Valarie Merino, MD 11/12/17 1151

## 2017-11-12 NOTE — ED Triage Notes (Signed)
Per EMS pt form home with c/o n/v and abdominal cramping, pt with hx of IBS, colitis and ulcers. Denies diarrhea, fever, blood in stool or vomit.

## 2017-11-12 NOTE — ED Notes (Signed)
Bed: BW38 Expected date:  Expected time:  Means of arrival:  Comments: EMS-nausea/vomiting

## 2017-11-19 ENCOUNTER — Encounter (HOSPITAL_COMMUNITY): Payer: Self-pay | Admitting: *Deleted

## 2017-11-19 ENCOUNTER — Emergency Department (HOSPITAL_COMMUNITY)
Admission: EM | Admit: 2017-11-19 | Discharge: 2017-11-19 | Disposition: A | Discharge status: Home / Self Care | Payer: Medicaid Other | Attending: Emergency Medicine | Admitting: Emergency Medicine

## 2017-11-19 DIAGNOSIS — I10 Essential (primary) hypertension: Secondary | ICD-10-CM | POA: Diagnosis not present

## 2017-11-19 DIAGNOSIS — Z79899 Other long term (current) drug therapy: Secondary | ICD-10-CM | POA: Insufficient documentation

## 2017-11-19 DIAGNOSIS — Z87891 Personal history of nicotine dependence: Secondary | ICD-10-CM | POA: Insufficient documentation

## 2017-11-19 DIAGNOSIS — E860 Dehydration: Secondary | ICD-10-CM | POA: Insufficient documentation

## 2017-11-19 DIAGNOSIS — F0390 Unspecified dementia without behavioral disturbance: Secondary | ICD-10-CM | POA: Insufficient documentation

## 2017-11-19 DIAGNOSIS — R630 Anorexia: Secondary | ICD-10-CM | POA: Diagnosis present

## 2017-11-19 LAB — CBC WITH DIFFERENTIAL/PLATELET
Basophils Absolute: 0.1 10*3/uL (ref 0.0–0.1)
Basophils Relative: 1 %
Eosinophils Absolute: 0.1 10*3/uL (ref 0.0–0.7)
Eosinophils Relative: 1 %
HEMATOCRIT: 43.3 % (ref 39.0–52.0)
HEMOGLOBIN: 14.5 g/dL (ref 13.0–17.0)
LYMPHS ABS: 1.4 10*3/uL (ref 0.7–4.0)
Lymphocytes Relative: 18 %
MCH: 31.2 pg (ref 26.0–34.0)
MCHC: 33.5 g/dL (ref 30.0–36.0)
MCV: 93.1 fL (ref 78.0–100.0)
MONO ABS: 0.8 10*3/uL (ref 0.1–1.0)
MONOS PCT: 10 %
NEUTROS ABS: 5.4 10*3/uL (ref 1.7–7.7)
NEUTROS PCT: 70 %
Platelets: 313 10*3/uL (ref 150–400)
RBC: 4.65 MIL/uL (ref 4.22–5.81)
RDW: 13.9 % (ref 11.5–15.5)
WBC: 7.7 10*3/uL (ref 4.0–10.5)

## 2017-11-19 LAB — BASIC METABOLIC PANEL WITH GFR
Anion gap: 7 (ref 5–15)
BUN: <5 mg/dL — ABNORMAL LOW (ref 6–20)
CO2: 29 mmol/L (ref 22–32)
Calcium: 9 mg/dL (ref 8.9–10.3)
Chloride: 100 mmol/L — ABNORMAL LOW (ref 101–111)
Creatinine, Ser: 0.71 mg/dL (ref 0.61–1.24)
GFR calc Af Amer: >60 mL/min (ref >60)
GFR calc non Af Amer: >60 mL/min (ref >60)
Glucose, Bld: 98 mg/dL (ref 65–99)
Potassium: 4.1 mmol/L (ref 3.5–5.1)
Sodium: 136 mmol/L (ref 135–145)

## 2017-11-19 LAB — URINALYSIS, ROUTINE W REFLEX MICROSCOPIC
BACTERIA UA: NONE SEEN
Bilirubin Urine: NEGATIVE
Glucose, UA: NEGATIVE mg/dL
KETONES UR: NEGATIVE mg/dL
Leukocytes, UA: NEGATIVE
NITRITE: NEGATIVE
Protein, ur: NEGATIVE mg/dL
SPECIFIC GRAVITY, URINE: 1.004 — AB (ref 1.005–1.030)
pH: 7 (ref 5.0–8.0)

## 2017-11-19 MED ORDER — CHLORDIAZEPOXIDE HCL 25 MG PO CAPS: ORAL_CAPSULE | ORAL | 0 refills | Status: DC

## 2017-11-19 MED ORDER — SODIUM CHLORIDE 0.9 % IV SOLN
INTRAVENOUS | Status: DC
  Administered 2017-11-19: 12:00:00 via INTRAVENOUS

## 2017-11-19 MED ORDER — LORAZEPAM 0.5 MG PO TABS
0.5000 mg | ORAL_TABLET | Freq: Once | ORAL | Status: AC
  Administered 2017-11-19: 0.5 mg via ORAL
  Filled 2017-11-19: qty 1

## 2017-11-19 MED ORDER — SODIUM CHLORIDE 0.9 % IV BOLUS
1000.0000 mL | Freq: Once | INTRAVENOUS | Status: AC
  Administered 2017-11-19: 1000 mL via INTRAVENOUS

## 2017-11-19 NOTE — ED Notes (Signed)
Orthostatic vitals  Supine  BP: 154/72 HR: 69  Sitting BP: 139/79 HR: 69  Standing BP: 144/78 HR: 73

## 2017-11-19 NOTE — ED Notes (Signed)
Patient verbalized understanding of discharge instructions, no questions. IV removed, bleeding controlled. Patient given script for librium. Patient out of ED with steady gait in no distress.

## 2017-11-19 NOTE — ED Provider Notes (Signed)
[facility] DEPT Provider Note   CSN: [id number] Arrival date & time: 11/19/17  1017     History   Chief Complaint Chief Complaint  Patient presents with  . Dehydration    HPI Anthony Lamb is a 62 y.o. male.  This is a 62 year old male who presents with subjective dehydration.  States that he has had no appetite and has had decreased oral intake.  Denies any dizziness with standing.  No emesis noted.  No diarrhea.  No abdominal or chest discomfort.  Seen for similar symptoms a few days ago given IV fluids and feels better.  Does admit to a great deal of stress due to his current living situation.  No medications used for his current condition.  Nothing makes it worse.     Past Medical History:  Diagnosis Date  . Alcohol abuse, in remission   . Anemia   . Ankylosing spondylitis ([place])   . Anxiety   . Chronic pain   . Colitis   . Depression   . Esophagitis   . Gastritis   . GERD (gastroesophageal reflux disease)   . Hypertension   . IBS (irritable bowel syndrome)   . Poor dentition   . SIADH (syndrome of inappropriate ADH production) [facility])     Patient Active Problem List   Diagnosis Date Noted  . IBS (irritable bowel syndrome)   . Hypertension   . Colitis   . Alcohol abuse, in remission   . Adjustment disorder with mixed anxiety and depressed mood 05/11/2017  . Dehydration 12/13/2016  . Alcohol abuse   . SOB (shortness of breath)   . Chest pain 11/11/2016  . Anxiety   . Gastroesophageal reflux disease   . Osteopenia determined by x-ray 08/06/2014  . Stress fracture of calcaneus 08/06/2014  . Vitamin D deficiency 12/18/2013  . Dementia 12/18/2013  . Benign microscopic hematuria 07/06/2013  . SIADH (syndrome of inappropriate ADH production) ([place]) 06/22/2013  . Hyponatremia 06/20/2013  . Ankylosing spondylitis ([place]) 06/20/2013  . Malnutrition of moderate degree ([place]) 06/20/2013  . BPH (benign prostatic hyperplasia)  08/22/2010  . Backache 04/19/2010  . ALCOHOLISM 08/13/2008  . History of colonic polyps 08/13/2008  . Essential hypertension 07/03/2007  . PUD (peptic ulcer disease) 07/03/2007  . Irritable bowel syndrome 07/03/2007    Past Surgical History:  Procedure Laterality Date  . COLONOSCOPY W/ BIOPSIES    . ESOPHAGOGASTRODUODENOSCOPY    . HERNIA REPAIR Bilateral   . TONSILLECTOMY          Home Medications    Prior to Admission medications   Medication Sig Start Date End Date Taking? Authorizing Provider  acetaminophen (TYLENOL) 500 MG tablet Take 1,000 mg by mouth every 6 (six) hours as needed (For headache and back pain.).     [provider]  amoxicillin-clavulanate (AUGMENTIN) 875-125 MG tablet Take 1 tablet by mouth every 12 (twelve) hours. Patient not taking: Reported on 11/12/2017 10/19/17   Lacretia Leigh, MD  chlordiazePOXIDE (LIBRIUM) 25 MG capsule 31m PO TID x 1D, then 25-568mPO BID X 1D, then 25-5053mO QD X 1D Patient not taking: Reported on 10/19/2017 10/01/17   MabPixie CasinoD  Cholecalciferol (VITAMIN D) 2000 units CAPS Take 4,000 Units by mouth daily.     [provider]  diazepam (VALIUM) 5 MG tablet Take 1 tablet (5 mg total) by mouth 2 (two) times daily. Patient not taking: Reported on 10/19/2017 09/29/17   HavIsla PenceD  hydrOXYzine (ATARAX/VISTARIL)  25 MG tablet Take 1 tablet (25 mg total) by mouth every 6 (six) hours as needed for anxiety. Patient not taking: Reported on 10/01/2017 06/11/17   Robinson, [place] N, PA-C  lisinopril (PRINIVIL,ZESTRIL) 10 MG tablet Take 1 tablet (10 mg total) by mouth daily. Patient taking differently: Take 10 mg by mouth every evening.  08/21/17 11/12/17  Lacroce, Hulen Shouts, MD  LORazepam (ATIVAN) 0.5 MG tablet Take 0.5 mg by mouth 3 (three) times daily as needed for anxiety.    [provider]  Multiple Vitamins-Minerals (MENS ONE DAILY PO) Take 1 tablet by mouth daily.    [provider]    ondansetron (ZOFRAN ODT) 4 MG disintegrating tablet 1m ODT q4 hours prn nausea/vomit Patient not taking: Reported on 10/01/2017 09/19/17   FDeno Etienne DO  pantoprazole (PROTONIX) 40 MG tablet Take 1 tablet (40 mg total) by mouth daily. 04/24/14   JJanith Lima MD  sodium chloride (OCEAN) 0.65 % SOLN nasal spray Place 1-2 sprays into both nostrils as needed for congestion.     [provider]  sucralfate (CARAFATE) 1 G tablet Take 1 tablet (1 g total) by mouth 4 (four) times daily -  with meals and at bedtime. Chew before swallowing 12/17/13   JJanith Lima MD  verapamil (CALAN-SR) 240 MG CR tablet Take 240 mg by mouth at bedtime. 02/09/17   [provider]  zolpidem (AMBIEN) 10 MG tablet Take 1 tablet (10 mg total) by mouth at bedtime as needed for sleep. Patient taking differently: Take 10 mg by mouth at bedtime.  05/13/17 11/12/17  KDelia Heady PA-C    Family History Family History  Problem Relation Age of Onset  . Anxiety disorder Mother   . Congestive Heart Failure Mother   . Crohn's disease Mother   . Colon cancer Mother 62 . Arthritis Father   . High blood pressure Father     Social History Social History   Tobacco Use  . Smoking status: Former Smoker    Types: Cigarettes  . Smokeless tobacco: Never Used  Substance Use Topics  . Alcohol use: Yes    Comment: daily/ 3-4 beers  . Drug use: No     Allergies   Diphenhydramine hcl; Flexeril [cyclobenzaprine]; Hctz [hydrochlorothiazide]; and Nsaids   Review of Systems Review of Systems  All other systems reviewed and are negative.    Physical Exam Updated Vital Signs BP (!) 150/75 (BP Location: Left Arm)   Pulse 72   Temp 98 F (36.7 C) (Oral)   Resp 17   SpO2 98%   Physical Exam  Constitutional: He is oriented to person, place, and time. He appears well-developed and well-nourished.  Non-toxic appearance. No distress.  HENT:  Head: Normocephalic and atraumatic.  Extremity poor dentition  noted without evidence of abscess.  Eyes: Pupils are equal, round, and reactive to light. Conjunctivae, EOM and lids are normal.  Neck: Normal range of motion. Neck supple. No tracheal deviation present. No thyroid mass present.  Cardiovascular: Normal rate, regular rhythm and normal heart sounds. Exam reveals no gallop.  No murmur heard. Pulmonary/Chest: Effort normal and breath sounds normal. No stridor. No respiratory distress. He has no decreased breath sounds. He has no wheezes. He has no rhonchi. He has no rales.  Abdominal: Soft. Normal appearance and bowel sounds are normal. He exhibits no distension. There is no tenderness. There is no rebound and no CVA tenderness.  Musculoskeletal: Normal range of motion. He exhibits no edema or  tenderness.  Neurological: He is alert and oriented to person, place, and time. He has normal strength. No cranial nerve deficit or sensory deficit. GCS eye subscore is 4. GCS verbal subscore is 5. GCS motor subscore is 6.  Skin: Skin is warm and dry. No abrasion and no rash noted.  Psychiatric: He has a normal mood and affect. His speech is normal and behavior is normal.  Nursing note and vitals reviewed.    ED Treatments / Results  Labs (all labs ordered are listed, but only abnormal results are displayed) Labs Reviewed  CBC WITH DIFFERENTIAL/PLATELET  BASIC METABOLIC PANEL  URINALYSIS, ROUTINE W REFLEX MICROSCOPIC    EKG None  Radiology No results found.  Procedures Procedures (including critical care time)  Medications Ordered in ED Medications  sodium chloride 0.9 % bolus 1,000 mL (has no administration in time range)  0.9 %  sodium chloride infusion (has no administration in time range)  LORazepam (ATIVAN) tablet 0.5 mg (has no administration in time range)     Initial Impression / Assessment and Plan / ED Course  I have reviewed the triage vital signs and the nursing notes.  Pertinent labs & imaging results that were available  during my care of the patient were reviewed by me and considered in my medical decision making (see chart for details).     Patient given IV hydration here and feels better.  States that he drinks about 4-5 beers a day and is concerned about going through withdrawals which she is experience before in the past.  Will give Librium taper.  Final Clinical Impressions(s) / ED Diagnoses   Final diagnoses:  None    ED Discharge Orders    None       Lacretia Leigh, MD 11/19/17 1327

## 2017-11-19 NOTE — ED Triage Notes (Signed)
Pt states he feels dehydrated. Pt states he has not eaten much over the past few weeks. Pt states he has not had an appetite and has trouble eating without teeth.

## 2017-12-11 ENCOUNTER — Encounter (HOSPITAL_COMMUNITY): Payer: Self-pay | Admitting: Emergency Medicine

## 2017-12-11 ENCOUNTER — Emergency Department (HOSPITAL_COMMUNITY)
Admission: EM | Admit: 2017-12-11 | Discharge: 2017-12-11 | Disposition: A | Discharge status: Home / Self Care | Payer: Medicaid Other | Attending: Emergency Medicine | Admitting: Emergency Medicine

## 2017-12-11 ENCOUNTER — Other Ambulatory Visit: Payer: Self-pay

## 2017-12-11 DIAGNOSIS — I10 Essential (primary) hypertension: Secondary | ICD-10-CM | POA: Insufficient documentation

## 2017-12-11 DIAGNOSIS — Z79899 Other long term (current) drug therapy: Secondary | ICD-10-CM | POA: Diagnosis not present

## 2017-12-11 DIAGNOSIS — R531 Weakness: Secondary | ICD-10-CM | POA: Diagnosis present

## 2017-12-11 DIAGNOSIS — Z87891 Personal history of nicotine dependence: Secondary | ICD-10-CM | POA: Insufficient documentation

## 2017-12-11 DIAGNOSIS — K122 Cellulitis and abscess of mouth: Secondary | ICD-10-CM | POA: Insufficient documentation

## 2017-12-11 DIAGNOSIS — E860 Dehydration: Secondary | ICD-10-CM | POA: Insufficient documentation

## 2017-12-11 LAB — MAGNESIUM: Magnesium: 1.9 mg/dL (ref 1.7–2.4)

## 2017-12-11 LAB — I-STAT CHEM 8, ED
CALCIUM ION: 1.11 mmol/L — AB (ref 1.15–1.40)
CREATININE: 0.6 mg/dL — AB (ref 0.61–1.24)
Chloride: 95 mmol/L — ABNORMAL LOW (ref 98–111)
GLUCOSE: 96 mg/dL (ref 70–99)
HEMATOCRIT: 45 % (ref 39.0–52.0)
HEMOGLOBIN: 15.3 g/dL (ref 13.0–17.0)
Potassium: 3.8 mmol/L (ref 3.5–5.1)
Sodium: 134 mmol/L — ABNORMAL LOW (ref 135–145)
TCO2: 28 mmol/L (ref 22–32)

## 2017-12-11 MED ORDER — GI COCKTAIL ~~LOC~~
30.0000 mL | Freq: Once | ORAL | Status: AC
  Administered 2017-12-11: 30 mL via ORAL
  Filled 2017-12-11: qty 30

## 2017-12-11 MED ORDER — LORAZEPAM 2 MG/ML IJ SOLN
0.5000 mg | Freq: Once | INTRAMUSCULAR | Status: AC
  Administered 2017-12-11: 0.5 mg via INTRAVENOUS
  Filled 2017-12-11: qty 1

## 2017-12-11 MED ORDER — SODIUM CHLORIDE 0.9 % IV BOLUS
1000.0000 mL | Freq: Once | INTRAVENOUS | Status: AC
  Administered 2017-12-11: 1000 mL via INTRAVENOUS

## 2017-12-11 MED ORDER — PENICILLIN V POTASSIUM 500 MG PO TABS: 500.0000 mg | ORAL_TABLET | Freq: Four times a day (QID) | ORAL | 0 refills | Status: AC

## 2017-12-11 MED ORDER — TRAMADOL HCL 50 MG PO TABS
50.0000 mg | ORAL_TABLET | Freq: Once | ORAL | Status: AC
  Administered 2017-12-11: 50 mg via ORAL
  Filled 2017-12-11: qty 1

## 2017-12-11 NOTE — ED Triage Notes (Signed)
Pt reports that he has weakness, and tired and not eating because dont have appetite and "dont have teeth to eat with", states that he is under a lot stress right now due to "I am about to be homeless for the first time in my life".

## 2017-12-11 NOTE — ED Provider Notes (Signed)
[facility] DEPT Provider Note   CSN: [id number] Arrival date & time: 12/11/17  0757     History   Chief Complaint Chief Complaint  Patient presents with  . Fatigue  . Stress  . not eating    HPI Anthony Lamb is a 62 y.o. male.  62 y/o male with a PMH of IBS, reflux presents to the ED complaining of feeling stress and not eating. Patient states he is about to be homeless and its dealing with a lot of stress.He states he has reduced his oral intake and cannot eat due to his teeth hurting.Patient has an appointment with PCP at the end of the month and will discuss being placed on a skilled nursing facility as he cannot care for himself.He has been drinking 4 beers daily.He denies any chest pain or shortness of breath.     Past Medical History:  Diagnosis Date  . Alcohol abuse, in remission   . Anemia   . Ankylosing spondylitis ([place])   . Anxiety   . Chronic pain   . Colitis   . Depression   . Esophagitis   . Gastritis   . GERD (gastroesophageal reflux disease)   . Hypertension   . IBS (irritable bowel syndrome)   . Poor dentition   . SIADH (syndrome of inappropriate ADH production) [facility])     Patient Active Problem List   Diagnosis Date Noted  . IBS (irritable bowel syndrome)   . Hypertension   . Colitis   . Alcohol abuse, in remission   . Adjustment disorder with mixed anxiety and depressed mood 05/11/2017  . Dehydration 12/13/2016  . Alcohol abuse   . SOB (shortness of breath)   . Chest pain 11/11/2016  . Anxiety   . Gastroesophageal reflux disease   . Osteopenia determined by x-ray 08/06/2014  . Stress fracture of calcaneus 08/06/2014  . Vitamin D deficiency 12/18/2013  . Dementia 12/18/2013  . Benign microscopic hematuria 07/06/2013  . SIADH (syndrome of inappropriate ADH production) ([place]) 06/22/2013  . Hyponatremia 06/20/2013  . Ankylosing spondylitis ([place]) 06/20/2013  . Malnutrition of moderate degree ([place])  06/20/2013  . BPH (benign prostatic hyperplasia) 08/22/2010  . Backache 04/19/2010  . ALCOHOLISM 08/13/2008  . History of colonic polyps 08/13/2008  . Essential hypertension 07/03/2007  . PUD (peptic ulcer disease) 07/03/2007  . Irritable bowel syndrome 07/03/2007    Past Surgical History:  Procedure Laterality Date  . COLONOSCOPY W/ BIOPSIES    . ESOPHAGOGASTRODUODENOSCOPY    . HERNIA REPAIR Bilateral   . TONSILLECTOMY          Home Medications    Prior to Admission medications   Medication Sig Start Date End Date Taking? Authorizing Provider  acetaminophen (TYLENOL) 500 MG tablet Take 1,000 mg by mouth every 6 (six) hours as needed (For headache and back pain.).    Yes [provider]  Cholecalciferol (VITAMIN D) 2000 units CAPS Take 4,000 Units by mouth daily.    Yes [provider]  lisinopril (PRINIVIL,ZESTRIL) 10 MG tablet Take 1 tablet (10 mg total) by mouth daily. Patient taking differently: Take 10 mg by mouth every evening.  08/21/17 12/11/17 Yes Lacroce, Hulen Shouts, MD  LORazepam (ATIVAN) 0.5 MG tablet Take 0.5 mg by mouth 3 (three) times daily as needed for anxiety.   Yes [provider]  Multiple Vitamins-Minerals (MENS ONE DAILY PO) Take 1 tablet by mouth daily.   Yes [provider]  pantoprazole (PROTONIX) 40 MG tablet  Take 1 tablet (40 mg total) by mouth daily. 04/24/14  Yes Janith Lima, MD  sodium chloride (OCEAN) 0.65 % SOLN nasal spray Place 1-2 sprays into both nostrils as needed for congestion.    Yes [provider]  sucralfate (CARAFATE) 1 G tablet Take 1 tablet (1 g total) by mouth 4 (four) times daily -  with meals and at bedtime. Chew before swallowing 12/17/13  Yes Janith Lima, MD  traZODone (DESYREL) 100 MG tablet Take 100 mg by mouth at bedtime. 11/29/17  Yes [provider]  verapamil (CALAN-SR) 240 MG CR tablet Take 240 mg by mouth at bedtime. 02/09/17  Yes [provider]  zolpidem  (AMBIEN) 10 MG tablet Take 1 tablet (10 mg total) by mouth at bedtime as needed for sleep. Patient taking differently: Take 10 mg by mouth at bedtime.  05/13/17 12/11/17 Yes Khatri, Hina, PA-C  amoxicillin-clavulanate (AUGMENTIN) 875-125 MG tablet Take 1 tablet by mouth every 12 (twelve) hours. Patient not taking: Reported on 11/12/2017 10/19/17   Lacretia Leigh, MD  chlordiazePOXIDE (LIBRIUM) 25 MG capsule 48m PO TID x 1D, then 25-521mPO BID X 1D, then 25-5037mO QD X 1D Patient not taking: Reported on 10/19/2017 10/01/17   MabPixie CasinoD  chlordiazePOXIDE (LIBRIUM) 25 MG capsule 32m85m TID x 1D, then 25-32mg17mBID X 1D, then 25-32mg 85mD X 1D Patient not taking: Reported on 12/11/2017 11/19/17   Allen,Lacretia Leighdiazepam (VALIUM) 5 MG tablet Take 1 tablet (5 mg total) by mouth 2 (two) times daily. Patient not taking: Reported on 10/19/2017 09/29/17   HavilaIsla PencehydrOXYzine (ATARAX/VISTARIL) 25 MG tablet Take 1 tablet (25 mg total) by mouth every 6 (six) hours as needed for anxiety. Patient not taking: Reported on 10/01/2017 06/11/17   Robinson, JordanMartinique-C  ondansetron (ZOFRAN ODT) 4 MG disintegrating tablet 4mg OD69m4 hours prn nausea/vomit Patient not taking: Reported on 10/01/2017 09/19/17   Floyd, Deno Etienneenicillin v potassium (VEETID) 500 MG tablet Take 1 tablet (500 mg total) by mouth 4 (four) times daily for 7 days. 12/11/17 12/18/17  Kadance Mccuistion, JJaneece Fitting   Family History Family History  Problem Relation Age of Onset  . Anxiety disorder Mother   . Congestive Heart Failure Mother   . Crohn's disease Mother   . Colon cancer Mother 60  . A63hritis Father   . High blood pressure Father     Social History Social History   Tobacco Use  . Smoking status: Former Smoker    Types: Cigarettes  . Smokeless tobacco: Never Used  Substance Use Topics  . Alcohol use: Yes    Comment: daily/ 3-4 beers  . Drug use: No     Allergies   Diphenhydramine hcl; Flexeril  [cyclobenzaprine]; Hctz [hydrochlorothiazide]; and Nsaids   Review of Systems Review of Systems  Constitutional: Negative for chills and fever.  Respiratory: Negative for chest tightness.   Cardiovascular: Negative for chest pain and palpitations.  Gastrointestinal: Positive for abdominal pain (generalized, history of IBS) and diarrhea. Negative for blood in stool, nausea and vomiting.  Genitourinary: Negative for flank pain and hematuria.  Musculoskeletal: Negative for back pain.  Neurological: Positive for dizziness and headaches. Negative for syncope and light-headedness.  All other systems reviewed and are negative.    Physical Exam Updated Vital Signs BP (!) 167/80 (BP Location: Left Arm)   Pulse 90   Temp 98.2 F (36.8 C) (Oral)  Resp 18   SpO2 100%   Physical Exam  Constitutional: He appears well-developed and well-nourished.  HENT:  Head: Normocephalic and atraumatic.  Mouth/Throat: Uvula is midline, oropharynx is clear and moist and mucous membranes are normal. He does not have dentures. No trismus in the jaw. Abnormal dentition. Dental abscesses (small dental abcess seen) and dental caries present.    Small abscess present will treat with antibiotics.   Pulmonary/Chest: Effort normal and breath sounds normal. He has no wheezes.  Abdominal: Soft. Bowel sounds are normal. He exhibits no distension. There is no tenderness.  Vitals reviewed.    ED Treatments / Results  Labs (all labs ordered are listed, but only abnormal results are displayed) Labs Reviewed  I-STAT CHEM 8, ED - Abnormal; Notable for the following components:      Result Value   Sodium 134 (*)    Chloride 95 (*)    BUN <3 (*)    Creatinine, Ser 0.60 (*)    Calcium, Ion 1.11 (*)    All other components within normal limits  MAGNESIUM    EKG None  Radiology No results found.  Procedures Procedures (including critical care time)  Medications Ordered in ED Medications  traMADol  (ULTRAM) tablet 50 mg (has no administration in time range)  sodium chloride 0.9 % bolus 1,000 mL (0 mLs Intravenous Stopped 12/11/17 0959)  LORazepam (ATIVAN) injection 0.5 mg (0.5 mg Intravenous Given 12/11/17 0849)  gi cocktail (Maalox,Lidocaine,Donnatal) (30 mLs Oral Given 12/11/17 2637)     Initial Impression / Assessment and Plan / ED Course  I have reviewed the triage vital signs and the nursing notes.  Pertinent labs & imaging results that were available during my care of the patient were reviewed by me and considered in my medical decision making (see chart for details).     Patient complaining of stress and fatigue x 1 month. Patient states he is feeling stressed because he is losing his home. Patient was seen for this complaint a month ago and given fluids and librium taper for alcohol withdrawal. I have given patient IV bolus and lorazepam. Lab results are within his baseline. Patient requested GI cocktail which has been given.  10:24 AM Patient state he is better now, but he will like something for pain for his teeth and a referral to an oral surgeon. Patient has a small abscess on the left front, will treat with antibiotics.   11:12 AM I have given pain medication for this patient in hospital. Advised patient to follow up with oral surgeon, I have provided referral.Will treat his small abscess with antibiotics.    Final Clinical Impressions(s) / ED Diagnoses   Final diagnoses:  Dehydration  Oral abscess    ED Discharge Orders        Ordered    penicillin v potassium (VEETID) 500 MG tablet  4 times daily     12/11/17 1109       Janeece Fitting, PA-C 12/11/17 Lott, Ankit, MD 12/11/17 1727

## 2017-12-11 NOTE — Discharge Instructions (Addendum)
Today you were seen in the Ed,all lab results were within normal range. Please return to the ED if you experience any shortness of breath, chest pain or fever.

## 2017-12-17 ENCOUNTER — Encounter (HOSPITAL_COMMUNITY): Payer: Self-pay

## 2017-12-17 ENCOUNTER — Other Ambulatory Visit: Payer: Self-pay

## 2017-12-17 ENCOUNTER — Emergency Department (HOSPITAL_COMMUNITY)
Admission: EM | Admit: 2017-12-17 | Discharge: 2017-12-17 | Disposition: A | Discharge status: Home / Self Care | Payer: Medicaid Other | Attending: Emergency Medicine | Admitting: Emergency Medicine

## 2017-12-17 DIAGNOSIS — I10 Essential (primary) hypertension: Secondary | ICD-10-CM | POA: Insufficient documentation

## 2017-12-17 DIAGNOSIS — F0390 Unspecified dementia without behavioral disturbance: Secondary | ICD-10-CM | POA: Diagnosis not present

## 2017-12-17 DIAGNOSIS — Z79899 Other long term (current) drug therapy: Secondary | ICD-10-CM | POA: Insufficient documentation

## 2017-12-17 DIAGNOSIS — Z87891 Personal history of nicotine dependence: Secondary | ICD-10-CM | POA: Diagnosis not present

## 2017-12-17 DIAGNOSIS — F419 Anxiety disorder, unspecified: Secondary | ICD-10-CM | POA: Insufficient documentation

## 2017-12-17 LAB — I-STAT CHEM 8, ED
BUN: <3 mg/dL — ABNORMAL LOW (ref 8–23)
Calcium, Ion: 1.07 mmol/L — ABNORMAL LOW (ref 1.15–1.40)
Chloride: 94 mmol/L — ABNORMAL LOW (ref 98–111)
Creatinine, Ser: 0.7 mg/dL (ref 0.61–1.24)
Glucose, Bld: 138 mg/dL — ABNORMAL HIGH (ref 70–99)
HCT: 50 % (ref 39.0–52.0)
HEMOGLOBIN: 17 g/dL (ref 13.0–17.0)
Potassium: 3.6 mmol/L (ref 3.5–5.1)
SODIUM: 132 mmol/L — AB (ref 135–145)
TCO2: 28 mmol/L (ref 22–32)

## 2017-12-17 MED ORDER — SODIUM CHLORIDE 0.9 % IV BOLUS
1000.0000 mL | Freq: Once | INTRAVENOUS | Status: AC
  Administered 2017-12-17: 1000 mL via INTRAVENOUS

## 2017-12-17 MED ORDER — ONDANSETRON HCL 4 MG/2ML IJ SOLN
4.0000 mg | Freq: Once | INTRAMUSCULAR | Status: AC | PRN
  Administered 2017-12-17: 4 mg via INTRAVENOUS
  Filled 2017-12-17: qty 2

## 2017-12-17 MED ORDER — LORAZEPAM 2 MG/ML IJ SOLN
1.0000 mg | Freq: Once | INTRAMUSCULAR | Status: AC
  Administered 2017-12-17: 1 mg via INTRAVENOUS
  Filled 2017-12-17: qty 1

## 2017-12-17 MED ORDER — SODIUM CHLORIDE 0.9 % IV SOLN
INTRAVENOUS | Status: DC
  Administered 2017-12-17: 10:00:00 via INTRAVENOUS

## 2017-12-17 NOTE — ED Provider Notes (Signed)
[facility] DEPT Provider Note   CSN: [id number] Arrival date & time: 12/17/17  1950     History   Chief Complaint Chief Complaint  Patient presents with  . Abdominal Pain    HPI Anthony Lamb is a 62 y.o. male.  62 year old male who presents with increased anxiety as well as watery diarrhea.  Patient well-known to me.  Denies any fever chills nausea no vomiting.  Feels that he is dehydrated.  States he is been out of his anxiety medications for several days.  Does have a prior history of alcohol abuse but denies any current use of alcohol.  No chest or abdominal discomfort.  Nothing makes his symptoms better except Ativan.  No current treatment used to arrival     Past Medical History:  Diagnosis Date  . Alcohol abuse, in remission   . Anemia   . Ankylosing spondylitis ([place])   . Anxiety   . Chronic pain   . Colitis   . Depression   . Esophagitis   . Gastritis   . GERD (gastroesophageal reflux disease)   . Hypertension   . IBS (irritable bowel syndrome)   . Poor dentition   . SIADH (syndrome of inappropriate ADH production) [facility])     Patient Active Problem List   Diagnosis Date Noted  . IBS (irritable bowel syndrome)   . Hypertension   . Colitis   . Alcohol abuse, in remission   . Adjustment disorder with mixed anxiety and depressed mood 05/11/2017  . Dehydration 12/13/2016  . Alcohol abuse   . SOB (shortness of breath)   . Chest pain 11/11/2016  . Anxiety   . Gastroesophageal reflux disease   . Osteopenia determined by x-ray 08/06/2014  . Stress fracture of calcaneus 08/06/2014  . Vitamin D deficiency 12/18/2013  . Dementia 12/18/2013  . Benign microscopic hematuria 07/06/2013  . SIADH (syndrome of inappropriate ADH production) ([place]) 06/22/2013  . Hyponatremia 06/20/2013  . Ankylosing spondylitis ([place]) 06/20/2013  . Malnutrition of moderate degree ([place]) 06/20/2013  . BPH (benign prostatic hyperplasia) 08/22/2010  .  Backache 04/19/2010  . ALCOHOLISM 08/13/2008  . History of colonic polyps 08/13/2008  . Essential hypertension 07/03/2007  . PUD (peptic ulcer disease) 07/03/2007  . Irritable bowel syndrome 07/03/2007    Past Surgical History:  Procedure Laterality Date  . COLONOSCOPY W/ BIOPSIES    . ESOPHAGOGASTRODUODENOSCOPY    . HERNIA REPAIR Bilateral   . TONSILLECTOMY          Home Medications    Prior to Admission medications   Medication Sig Start Date End Date Taking? Authorizing Provider  acetaminophen (TYLENOL) 500 MG tablet Take 1,000 mg by mouth every 6 (six) hours as needed (For headache and back pain.).     [provider]  amoxicillin-clavulanate (AUGMENTIN) 875-125 MG tablet Take 1 tablet by mouth every 12 (twelve) hours. Patient not taking: Reported on 11/12/2017 10/19/17   Lacretia Leigh, MD  chlordiazePOXIDE (LIBRIUM) 25 MG capsule 54m PO TID x 1D, then 25-522mPO BID X 1D, then 25-5027mO QD X 1D Patient not taking: Reported on 10/19/2017 10/01/17   MabPixie CasinoD  chlordiazePOXIDE (LIBRIUM) 25 MG capsule 54m61m TID x 1D, then 25-54mg34mBID X 1D, then 25-54mg 3mD X 1D Patient not taking: Reported on 12/11/2017 11/19/17   Norlan Rann,Lacretia LeighCholecalciferol (VITAMIN D) 2000 units CAPS Take 4,000 Units by mouth daily.     [provider]  diazepam (  VALIUM) 5 MG tablet Take 1 tablet (5 mg total) by mouth 2 (two) times daily. Patient not taking: Reported on 10/19/2017 09/29/17   [name], MD  hydrOXYzine (ATARAX/VISTARIL) 25 MG tablet Take 1 tablet (25 mg total) by mouth every 6 (six) hours as needed for anxiety. Patient not taking: Reported on 10/01/2017 06/11/17   Robinson, Martin[place] N, PA-C  lisinopril (PRINIVIL,ZESTRIL) 10 MG tablet Take 1 tablet (10 mg total) by mouth daily. Patient taking differently: Take 10 mg by mouth every evening.  08/21/17 12/11/17  Lacroce, Hulen Shouts, MD  LORazepam (ATIVAN) 0.5 MG tablet Take 0.5 mg by mouth 3 (three) times daily as  needed for anxiety.    [provider]  Multiple Vitamins-Minerals (MENS ONE DAILY PO) Take 1 tablet by mouth daily.    [provider]  ondansetron (ZOFRAN ODT) 4 MG disintegrating tablet 61m ODT q4 hours prn nausea/vomit Patient not taking: Reported on 10/01/2017 09/19/17   FDeno Etienne DO  pantoprazole (PROTONIX) 40 MG tablet Take 1 tablet (40 mg total) by mouth daily. 04/24/14   JJanith Lima MD  penicillin v potassium (VEETID) 500 MG tablet Take 1 tablet (500 mg total) by mouth 4 (four) times daily for 7 days. 12/11/17 12/18/17  SJaneece Fitting PA-C  sodium chloride (OCEAN) 0.65 % SOLN nasal spray Place 1-2 sprays into both nostrils as needed for congestion.     [provider]  sucralfate (CARAFATE) 1 G tablet Take 1 tablet (1 g total) by mouth 4 (four) times daily -  with meals and at bedtime. Chew before swallowing 12/17/13   JJanith Lima MD  traZODone (DESYREL) 100 MG tablet Take 100 mg by mouth at bedtime. 11/29/17   [provider]  verapamil (CALAN-SR) 240 MG CR tablet Take 240 mg by mouth at bedtime. 02/09/17   [provider]  zolpidem (AMBIEN) 10 MG tablet Take 1 tablet (10 mg total) by mouth at bedtime as needed for sleep. Patient taking differently: Take 10 mg by mouth at bedtime.  05/13/17 12/11/17  KDelia Heady PA-C    Family History Family History  Problem Relation Age of Onset  . Anxiety disorder Mother   . Congestive Heart Failure Mother   . Crohn's disease Mother   . Colon cancer Mother 674 . Arthritis Father   . High blood pressure Father     Social History Social History   Tobacco Use  . Smoking status: Former Smoker    Types: Cigarettes  . Smokeless tobacco: Never Used  Substance Use Topics  . Alcohol use: Yes    Comment: daily/ 3-4 beers  . Drug use: No     Allergies   Diphenhydramine hcl; Flexeril [cyclobenzaprine]; Hctz [hydrochlorothiazide]; and Nsaids   Review of Systems Review of Systems  All other  systems reviewed and are negative.    Physical Exam Updated Vital Signs BP (!) 169/82   Pulse 77   Temp 97.7 F (36.5 C) (Oral)   Resp 14   Ht 1.778 m (5' 10" )   Wt 79.4 kg (175 lb)   SpO2 99%   BMI 25.11 kg/m   Physical Exam  Constitutional: He is oriented to person, place, and time. He appears well-developed and well-nourished.  Non-toxic appearance. No distress.  HENT:  Head: Normocephalic and atraumatic.  Eyes: Pupils are equal, round, and reactive to light. Conjunctivae, EOM and lids are normal.  Neck: Normal range of motion. Neck supple. No tracheal deviation present. No thyroid mass present.  Cardiovascular: Normal rate, regular rhythm and normal heart sounds. Exam reveals no gallop.  No murmur heard. Pulmonary/Chest: Effort normal and breath sounds normal. No stridor. No respiratory distress. He has no decreased breath sounds. He has no wheezes. He has no rhonchi. He has no rales.  Abdominal: Soft. Normal appearance and bowel sounds are normal. He exhibits no distension. There is no tenderness. There is no rebound and no CVA tenderness.  Musculoskeletal: Normal range of motion. He exhibits no edema or tenderness.  Neurological: He is alert and oriented to person, place, and time. He has normal strength. No cranial nerve deficit or sensory deficit. GCS eye subscore is 4. GCS verbal subscore is 5. GCS motor subscore is 6.  Skin: Skin is warm and dry. No abrasion and no rash noted.  Psychiatric: His speech is normal and behavior is normal. His mood appears anxious.  Nursing note and vitals reviewed.    ED Treatments / Results  Labs (all labs ordered are listed, but only abnormal results are displayed) Labs Reviewed  I-STAT CHEM 8, ED    EKG None  Radiology No results found.  Procedures Procedures (including critical care time)  Medications Ordered in ED Medications  ondansetron (ZOFRAN) injection 4 mg (has no administration in time range)  sodium chloride  0.9 % bolus 1,000 mL (has no administration in time range)  0.9 %  sodium chloride infusion (has no administration in time range)  LORazepam (ATIVAN) injection 1 mg (has no administration in time range)     Initial Impression / Assessment and Plan / ED Course  I have reviewed the triage vital signs and the nursing notes.  Pertinent labs & imaging results that were available during my care of the patient were reviewed by me and considered in my medical decision making (see chart for details).     Patient given IV fluids here.  Also given Ativan for his obvious anxiety.  Patient states symptoms are relieved with burping.  His abdominal exam is benign.  Patient is strongly requesting more benzodiazepines I do not feel comfortable prescribing these.  He does have a primary care doctor and I informed him to give that person to call.  Final Clinical Impressions(s) / ED Diagnoses   Final diagnoses:  None    ED Discharge Orders    None       Lacretia Leigh, MD 12/17/17 1058

## 2017-12-17 NOTE — ED Notes (Signed)
Patient burped 5 times with RN and indigestion. Urinal at bedside. Patient aware we need urine sample.

## 2017-12-17 NOTE — ED Triage Notes (Signed)
Pt comes from home. Pt is AOx4 and ambulatory. Pt stated he is here for dehydration due to medication pt takes but has also been having some loose bowel movements due to colitis. Pt stated he also has anxiety and has not had anxiety medication Lorazepam 75m for since Saturday. Pt has no other complaints.

## 2017-12-21 ENCOUNTER — Emergency Department (HOSPITAL_COMMUNITY): Payer: Medicaid Other

## 2017-12-21 ENCOUNTER — Encounter (HOSPITAL_COMMUNITY): Payer: Self-pay | Admitting: Emergency Medicine

## 2017-12-21 ENCOUNTER — Emergency Department (HOSPITAL_COMMUNITY)
Admission: EM | Admit: 2017-12-21 | Discharge: 2017-12-21 | Disposition: A | Discharge status: Home / Self Care | Payer: Medicaid Other | Attending: Emergency Medicine | Admitting: Emergency Medicine

## 2017-12-21 ENCOUNTER — Other Ambulatory Visit: Payer: Self-pay

## 2017-12-21 DIAGNOSIS — F1010 Alcohol abuse, uncomplicated: Secondary | ICD-10-CM | POA: Insufficient documentation

## 2017-12-21 DIAGNOSIS — F4323 Adjustment disorder with mixed anxiety and depressed mood: Secondary | ICD-10-CM | POA: Diagnosis not present

## 2017-12-21 DIAGNOSIS — E871 Hypo-osmolality and hyponatremia: Secondary | ICD-10-CM | POA: Diagnosis not present

## 2017-12-21 DIAGNOSIS — F329 Major depressive disorder, single episode, unspecified: Secondary | ICD-10-CM | POA: Insufficient documentation

## 2017-12-21 DIAGNOSIS — F411 Generalized anxiety disorder: Secondary | ICD-10-CM | POA: Diagnosis not present

## 2017-12-21 DIAGNOSIS — Z87891 Personal history of nicotine dependence: Secondary | ICD-10-CM | POA: Insufficient documentation

## 2017-12-21 DIAGNOSIS — I10 Essential (primary) hypertension: Secondary | ICD-10-CM | POA: Insufficient documentation

## 2017-12-21 DIAGNOSIS — K219 Gastro-esophageal reflux disease without esophagitis: Secondary | ICD-10-CM | POA: Insufficient documentation

## 2017-12-21 DIAGNOSIS — R0789 Other chest pain: Secondary | ICD-10-CM | POA: Insufficient documentation

## 2017-12-21 DIAGNOSIS — R079 Chest pain, unspecified: Secondary | ICD-10-CM | POA: Diagnosis present

## 2017-12-21 LAB — BASIC METABOLIC PANEL
ANION GAP: 12 (ref 5–15)
BUN: <5 mg/dL — ABNORMAL LOW (ref 8–23)
CHLORIDE: 92 mmol/L — AB (ref 98–111)
CO2: 25 mmol/L (ref 22–32)
Calcium: 8.8 mg/dL — ABNORMAL LOW (ref 8.9–10.3)
Creatinine, Ser: 0.66 mg/dL (ref 0.61–1.24)
Glucose, Bld: 108 mg/dL — ABNORMAL HIGH (ref 70–99)
POTASSIUM: 3.9 mmol/L (ref 3.5–5.1)
SODIUM: 129 mmol/L — AB (ref 135–145)

## 2017-12-21 LAB — CBC
HEMATOCRIT: 42.7 % (ref 39.0–52.0)
Hemoglobin: 14.6 g/dL (ref 13.0–17.0)
MCH: 30.4 pg (ref 26.0–34.0)
MCHC: 34.2 g/dL (ref 30.0–36.0)
MCV: 88.8 fL (ref 78.0–100.0)
PLATELETS: 326 10*3/uL (ref 150–400)
RBC: 4.81 MIL/uL (ref 4.22–5.81)
RDW: 12.4 % (ref 11.5–15.5)
WBC: 9.6 10*3/uL (ref 4.0–10.5)

## 2017-12-21 LAB — I-STAT TROPONIN, ED: Troponin i, poc: 0.01 ng/mL (ref 0.00–0.08)

## 2017-12-21 MED ORDER — VITAMIN B-1 100 MG PO TABS
100.0000 mg | ORAL_TABLET | Freq: Every day | ORAL | Status: DC
  Administered 2017-12-21: 100 mg via ORAL
  Filled 2017-12-21: qty 1

## 2017-12-21 MED ORDER — CHLORDIAZEPOXIDE HCL 25 MG PO CAPS: 50.0000 mg | ORAL_CAPSULE | Freq: Three times a day (TID) | ORAL | 0 refills | Status: DC | PRN

## 2017-12-21 MED ORDER — LORAZEPAM 2 MG/ML IJ SOLN: 0.0000 mg | Freq: Four times a day (QID) | INTRAMUSCULAR | Status: DC

## 2017-12-21 MED ORDER — SODIUM CHLORIDE 0.9 % IV BOLUS
1000.0000 mL | Freq: Once | INTRAVENOUS | Status: AC
  Administered 2017-12-21: 1000 mL via INTRAVENOUS

## 2017-12-21 MED ORDER — THIAMINE HCL 100 MG/ML IJ SOLN: 100.0000 mg | Freq: Every day | INTRAMUSCULAR | Status: DC

## 2017-12-21 MED ORDER — LORAZEPAM 2 MG/ML IJ SOLN: 0.0000 mg | Freq: Two times a day (BID) | INTRAMUSCULAR | Status: DC

## 2017-12-21 MED ORDER — GI COCKTAIL ~~LOC~~
30.0000 mL | Freq: Once | ORAL | Status: AC
  Administered 2017-12-21: 30 mL via ORAL
  Filled 2017-12-21: qty 30

## 2017-12-21 MED ORDER — LORAZEPAM 1 MG PO TABS: 0.0000 mg | ORAL_TABLET | Freq: Two times a day (BID) | ORAL | Status: DC

## 2017-12-21 MED ORDER — LORAZEPAM 1 MG PO TABS
0.0000 mg | ORAL_TABLET | Freq: Four times a day (QID) | ORAL | Status: DC
  Administered 2017-12-21: 1 mg via ORAL
  Filled 2017-12-21: qty 1

## 2017-12-21 NOTE — ED Provider Notes (Addendum)
[facility] EMERGENCY DEPARTMENT Provider Note   CSN: [id number] Arrival date & time: 12/21/17  9735     History   Chief Complaint Chief Complaint  Patient presents with  . Chest Pain  . Anxiety    HPI Anthony Lamb is a 62 y.o. male.  62 year old male with past medical history including alcohol abuse, anemia, anxiety/depression, ankylosing spondylitis, GERD, hypertension who presents with chest pain and anxiety.  Patient reports that yesterday evening he began having intermittent, sharp, nonradiating left-sided chest pain that occurred at rest and lasted a few minutes at a time.  Chest pain is nonexertional and happened a few times yesterday evening.  His last episode of chest pain was last night and he denies any chest pain today. He also endorses severe reflux sx and belching with epigastric pain, takes protonix and carafate without relief. He reports severe anxiety over the fact that he is out of his medications including his anxiety medicine and he is going to be homeless next month. He reports SOB with his anxiety feelings. No fevers or cough. He reports 3-4 drinks daily, last drink was last night. Denies severe depression, SI or HI.   The history is provided by the patient.  Chest Pain    Anxiety  Associated symptoms include chest pain.    Past Medical History:  Diagnosis Date  . Alcohol abuse, in remission   . Anemia   . Ankylosing spondylitis ([place])   . Anxiety   . Chronic pain   . Colitis   . Depression   . Esophagitis   . Gastritis   . GERD (gastroesophageal reflux disease)   . Hypertension   . IBS (irritable bowel syndrome)   . Poor dentition   . SIADH (syndrome of inappropriate ADH production) [facility])     Patient Active Problem List   Diagnosis Date Noted  . IBS (irritable bowel syndrome)   . Hypertension   . Colitis   . Alcohol abuse, in remission   . Adjustment disorder with mixed anxiety and depressed mood 05/11/2017  .  Dehydration 12/13/2016  . Alcohol abuse   . SOB (shortness of breath)   . Chest pain 11/11/2016  . Anxiety   . Gastroesophageal reflux disease   . Osteopenia determined by x-ray 08/06/2014  . Stress fracture of calcaneus 08/06/2014  . Vitamin D deficiency 12/18/2013  . Dementia 12/18/2013  . Benign microscopic hematuria 07/06/2013  . SIADH (syndrome of inappropriate ADH production) ([place]) 06/22/2013  . Hyponatremia 06/20/2013  . Ankylosing spondylitis ([place]) 06/20/2013  . Malnutrition of moderate degree ([place]) 06/20/2013  . BPH (benign prostatic hyperplasia) 08/22/2010  . Backache 04/19/2010  . ALCOHOLISM 08/13/2008  . History of colonic polyps 08/13/2008  . Essential hypertension 07/03/2007  . PUD (peptic ulcer disease) 07/03/2007  . Irritable bowel syndrome 07/03/2007    Past Surgical History:  Procedure Laterality Date  . COLONOSCOPY W/ BIOPSIES    . ESOPHAGOGASTRODUODENOSCOPY    . HERNIA REPAIR Bilateral   . TONSILLECTOMY          Home Medications    Prior to Admission medications   Medication Sig Start Date End Date Taking? Authorizing Provider  acetaminophen (TYLENOL) 500 MG tablet Take 1,000 mg by mouth every 6 (six) hours as needed (For headache and back pain.).     [provider]  amoxicillin-clavulanate (AUGMENTIN) 875-125 MG tablet Take 1 tablet by mouth every 12 (twelve) hours. Patient not taking: Reported on 11/12/2017 10/19/17   Lacretia Leigh, MD  chlordiazePOXIDE (LIBRIUM) 25 MG capsule 52m PO TID x 1D, then 25-547mPO BID X 1D, then 25-5075mO QD X 1D Patient not taking: Reported on 10/19/2017 10/01/17   MabPixie CasinoD  chlordiazePOXIDE (LIBRIUM) 25 MG capsule 60m16m TID x 1D, then 25-60mg64mBID X 1D, then 25-60mg 55mD X 1D Patient not taking: Reported on 12/11/2017 11/19/17   Allen,Lacretia Leighdiazepam (VALIUM) 5 MG tablet Take 1 tablet (5 mg total) by mouth 2 (two) times daily. Patient not taking: Reported on 10/19/2017 09/29/17   HavilaIsla PencehydrOXYzine (ATARAX/VISTARIL) 25 MG tablet Take 1 tablet (25 mg total) by mouth every 6 (six) hours as needed for anxiety. Patient not taking: Reported on 10/01/2017 06/11/17   Robinson, JordanMartinique-C  lisinopril (PRINIVIL,ZESTRIL) 10 MG tablet Take 1 tablet (10 mg total) by mouth daily. Patient taking differently: Take 10 mg by mouth every evening.  08/21/17 12/17/17  Lacroce, SamantHulen ShoutsLORazepam (ATIVAN) 0.5 MG tablet Take 0.5 mg by mouth 3 (three) times daily as needed for anxiety.    [provider]  Multiple Vitamins-Minerals (MENS ONE DAILY PO) Take 1 tablet by mouth daily.    [provider]  ondansetron (ZOFRAN ODT) 4 MG disintegrating tablet 4mg OD92m4 hours prn nausea/vomit Patient not taking: Reported on 10/01/2017 09/19/17   Floyd, Deno Etienneantoprazole (PROTONIX) 40 MG tablet Take 1 tablet (40 mg total) by mouth daily. 04/24/14   Jones, Janith Limaodium chloride (OCEAN) 0.65 % SOLN nasal spray Place 1-2 sprays into both nostrils as needed for congestion.     [provider]  sucralfate (CARAFATE) 1 G tablet Take 1 tablet (1 g total) by mouth 4 (four) times daily -  with meals and at bedtime. Chew before swallowing 12/17/13   Jones, Janith LimaraZODone (DESYREL) 100 MG tablet Take 100 mg by mouth at bedtime. 11/29/17   [provider]  verapamil (CALAN-SR) 240 MG CR tablet Take 240 mg by mouth at bedtime. 02/09/17   [provider]  zolpidem (AMBIEN) 10 MG tablet Take 1 tablet (10 mg total) by mouth at bedtime as needed for sleep. Patient taking differently: Take 10 mg by mouth at bedtime.  05/13/17 12/11/17  Khatri,Delia Heady   Family History Family History  Problem Relation Age of Onset  . Anxiety disorder Mother   . Congestive Heart Failure Mother   . Crohn's disease Mother   . Colon cancer Mother 60  . A44hritis Father   . High blood pressure Father     Social History Social History   Tobacco Use  . Smoking  status: Former Smoker    Types: Cigarettes  . Smokeless tobacco: Never Used  Substance Use Topics  . Alcohol use: Yes    Comment: daily/ 3-4 beers  . Drug use: No     Allergies   Diphenhydramine hcl; Flexeril [cyclobenzaprine]; Hctz [hydrochlorothiazide]; and Nsaids   Review of Systems Review of Systems  Cardiovascular: Positive for chest pain.   All other systems reviewed and are negative except that which was mentioned in HPI   Physical Exam Updated Vital Signs BP (!) 152/79   Pulse 67   Temp 98.1 F (36.7 C) (Oral)   Resp 18   Ht 5' 11"  (1.803 m)   Wt 79.4 kg (175 lb)   SpO2 98%   BMI 24.41 kg/m   Physical Exam  Constitutional: He is  oriented to person, place, and time. He appears well-developed and well-nourished. No distress.  HENT:  Head: Normocephalic and atraumatic.  Moist mucous membranes  Eyes: Conjunctivae are normal.  Neck: Neck supple.  Cardiovascular: Normal rate, regular rhythm and normal heart sounds.  No murmur heard. Pulmonary/Chest: Effort normal and breath sounds normal.  Abdominal: Soft. Bowel sounds are normal. He exhibits no distension. There is tenderness (epigastric). There is no rebound and no guarding.  Musculoskeletal: He exhibits no edema.  Neurological: He is alert and oriented to person, place, and time.  Fluent speech  Skin: Skin is warm and dry.  Psychiatric: Judgment normal. His mood appears anxious.  Disheveled appearance  Nursing note and vitals reviewed.    ED Treatments / Results  Labs (all labs ordered are listed, but only abnormal results are displayed) Labs Reviewed  BASIC METABOLIC PANEL - Abnormal; Notable for the following components:      Result Value   Sodium 129 (*)    Chloride 92 (*)    Glucose, Bld 108 (*)    BUN <5 (*)    Calcium 8.8 (*)    All other components within normal limits  CBC  I-STAT TROPONIN, ED    EKG EKG Interpretation  Date/Time:  Friday December 21 2017 08:20:10 EDT Ventricular  Rate:  74 PR Interval:    QRS Duration: 108 QT Interval:  418 QTC Calculation: 464 R Axis:   69 Text Interpretation:  Sinus rhythm No significant change since last tracing Confirmed by Theotis Burrow [phone]) on 12/21/2017 8:30:46 AM   Radiology Dg Chest 2 View  Result Date: 12/21/2017 CLINICAL DATA:  Chest pain.  Anxiety. EXAM: CHEST - 2 VIEW COMPARISON:  10/19/2017. FINDINGS: Mediastinum hilar structures normal. Lungs are clear. No pleural effusion or pneumothorax. Heart size normal. No acute bony abnormality. Degenerative changes thoracic spine. Kyphosis thoracic spine. Ankylosing spondylitis may be present. IMPRESSION: No acute cardiopulmonary disease. 2. Diffuse degenerative changes of ankylosing spondylitis thoracic spine. Electronically Signed   By: Marcello Moores  Register   On: 12/21/2017 09:24    Procedures Procedures (including critical care time)  Medications Ordered in ED Medications  LORazepam (ATIVAN) injection 0-4 mg ( Intravenous See Alternative 12/21/17 0944)    Or  LORazepam (ATIVAN) tablet 0-4 mg (1 mg Oral Given 12/21/17 0944)  LORazepam (ATIVAN) injection 0-4 mg (has no administration in time range)    Or  LORazepam (ATIVAN) tablet 0-4 mg (has no administration in time range)  thiamine (VITAMIN B-1) tablet 100 mg (100 mg Oral Given 12/21/17 0944)    Or  thiamine (B-1) injection 100 mg ( Intravenous See Alternative 12/21/17 0944)  sodium chloride 0.9 % bolus 1,000 mL (has no administration in time range)  gi cocktail (Maalox,Lidocaine,Donnatal) (30 mLs Oral Given 12/21/17 0945)     Initial Impression / Assessment and Plan / ED Course  I have reviewed the triage vital signs and the nursing notes.  Pertinent labs & imaging results that were available during my care of the patient were reviewed by me and considered in my medical decision making (see chart for details).     PT was anxious on exam but with reassuring VS. EKG without ischemic changes. Labs reassuring. Na 129,  similar to previous mild hyponatremia. Gave NS bolus and discussed need for recheck w/ PCP.  No chest pain today, episodes yesterday were brief, non-exertional, and seemed to correlate with his anxiety symptoms. Given normal trop and reassuring EKG, I doubt ACS. He has no tachycardia or hypoxia  and no ongoing symptoms to suggest PE. Pt repeatedly mentions his anxiety and I feel this is his chief problem driving all of his other symptoms. He denies SI or HI and I feel he is stable for outpatient management. Recommended [facility] f/u, pt was concerned about getting there as he has no ride. Contacted SW, provided w/ paperwork to apply for ride assistance. He is anxious but no hallucinations or severe vital sign abnormalities to suggest severe alcohol withdrawal.  Will provide with Librium taper.  I explained that I cannot provide refills on benzos for his chronic anxiety symptoms.  Reviewed return precautions and patient voiced understanding. Final Clinical Impressions(s) / ED Diagnoses   Final diagnoses:  None    ED Discharge Orders    None       Lief Palmatier, Wenda Overland, MD 12/21/17 [place], Wenda Overland, MD 12/21/17 [place], Wenda Overland, MD 12/21/17 1037

## 2017-12-21 NOTE — ED Triage Notes (Signed)
Per EMS pt from home with c/o intermittent CP.  Pt currently CP free. Pt is A/O x4.  Pt states he has been out of his meds for approx 1 week.  Notably anxious on arrival due top financial issues and the possibility of losing current residence.  No medications given PTA.

## 2017-12-21 NOTE — ED Notes (Signed)
Dr. Rex Kras at bedside at this time.

## 2017-12-21 NOTE — ED Notes (Addendum)
Pt states he drinks 3-4 (12oz) each night including last night.

## 2017-12-21 NOTE — Progress Notes (Signed)
CSW spoke with pt at bedside. Pt requested application for [facility]AT as pt is needing transportation to and from medical appointment.s CSW provided pt with SCAT application and explained next steps in completing form. Pt expressed verbal understanding and presented no further questions or concerns to CSW at this time.   Anthony Lamb, MSW, [facility] Clinical Social Worker [phone]

## 2018-01-05 ENCOUNTER — Encounter (HOSPITAL_COMMUNITY): Payer: Self-pay

## 2018-01-05 ENCOUNTER — Other Ambulatory Visit: Payer: Self-pay

## 2018-01-05 ENCOUNTER — Emergency Department (HOSPITAL_COMMUNITY)
Admission: EM | Admit: 2018-01-05 | Discharge: 2018-01-05 | Disposition: A | Discharge status: Home / Self Care | Payer: Medicaid Other | Attending: Emergency Medicine | Admitting: Emergency Medicine

## 2018-01-05 DIAGNOSIS — F419 Anxiety disorder, unspecified: Secondary | ICD-10-CM | POA: Diagnosis not present

## 2018-01-05 DIAGNOSIS — F329 Major depressive disorder, single episode, unspecified: Secondary | ICD-10-CM | POA: Diagnosis present

## 2018-01-05 LAB — BASIC METABOLIC PANEL
ANION GAP: 11 (ref 5–15)
BUN: 5 mg/dL — ABNORMAL LOW (ref 8–23)
CALCIUM: 9 mg/dL (ref 8.9–10.3)
CHLORIDE: 92 mmol/L — AB (ref 98–111)
CO2: 28 mmol/L (ref 22–32)
Creatinine, Ser: 0.84 mg/dL (ref 0.61–1.24)
GFR calc Af Amer: >60 mL/min (ref >60)
GFR calc non Af Amer: >60 mL/min (ref >60)
GLUCOSE: 121 mg/dL — AB (ref 70–99)
Potassium: 4.5 mmol/L (ref 3.5–5.1)
Sodium: 131 mmol/L — ABNORMAL LOW (ref 135–145)

## 2018-01-05 LAB — CBC WITH DIFFERENTIAL/PLATELET
BASOS PCT: 0 %
Basophils Absolute: 0 10*3/uL (ref 0.0–0.1)
Eosinophils Absolute: 0.1 10*3/uL (ref 0.0–0.7)
Eosinophils Relative: 1 %
HEMATOCRIT: 44 % (ref 39.0–52.0)
HEMOGLOBIN: 14.6 g/dL (ref 13.0–17.0)
LYMPHS ABS: 1 10*3/uL (ref 0.7–4.0)
LYMPHS PCT: 11 %
MCH: 30.6 pg (ref 26.0–34.0)
MCHC: 33.2 g/dL (ref 30.0–36.0)
MCV: 92.2 fL (ref 78.0–100.0)
MONO ABS: 1 10*3/uL (ref 0.1–1.0)
MONOS PCT: 11 %
NEUTROS ABS: 7.1 10*3/uL (ref 1.7–7.7)
NEUTROS PCT: 77 %
Platelets: 386 10*3/uL (ref 150–400)
RBC: 4.77 MIL/uL (ref 4.22–5.81)
RDW: 13 % (ref 11.5–15.5)
WBC: 9.3 10*3/uL (ref 4.0–10.5)

## 2018-01-05 MED ORDER — SODIUM CHLORIDE 0.9 % IV BOLUS
1000.0000 mL | Freq: Once | INTRAVENOUS | Status: AC
  Administered 2018-01-05: 1000 mL via INTRAVENOUS

## 2018-01-05 MED ORDER — LORAZEPAM 2 MG/ML IJ SOLN
1.0000 mg | Freq: Once | INTRAMUSCULAR | Status: AC
  Administered 2018-01-05: 1 mg via INTRAVENOUS
  Filled 2018-01-05: qty 1

## 2018-01-05 NOTE — ED Triage Notes (Signed)
He tells [place] that he is "getting ready to be thrown out of my apartment at the end of the month; and I'm real depressed". He is in no distress.

## 2018-01-05 NOTE — ED Notes (Signed)
Pt unable to sign due to room not having E-Signature pad.

## 2018-01-05 NOTE — ED Provider Notes (Signed)
[facility] DEPT Provider Note   CSN: [id number] Arrival date & time: 01/05/18  0755     History   Chief Complaint Chief Complaint  Patient presents with  . Depression    HPI Anthony Lamb is a 62 y.o. male.  Patient is a 62 year old male with a history of depression, hypertension, IBS, anxiety, prior alcohol abuse who presents with anxiety and dehydration.  He states that he is been very anxious lately because he has to get out of his home at the end of the month.  He does have a lady that helping him get into an assisted living facility.  He states that he has filled out the paperwork.  He states that today he just got more anxious and felt like he could not eat or drink and feels like he is dehydrated.  He came here today for treatment of his dehydration and anxiety.  He denies any suicidal ideations.  He has some diarrhea but he says that is chronic and unchanged from his baseline.  No abdominal pain.  No no fevers.  No vomiting.  He denies any hallucinations.  He does not feel like he needs to talk to the TTS.  He does not feel like he needs to talk to a Education officer, museum because he has been made is already helping him with placement.  He just wants some treatment for his dehydration and anxiety.     Past Medical History:  Diagnosis Date  . Alcohol abuse, in remission   . Anemia   . Ankylosing spondylitis ([place])   . Anxiety   . Chronic pain   . Colitis   . Depression   . Esophagitis   . Gastritis   . GERD (gastroesophageal reflux disease)   . Hypertension   . IBS (irritable bowel syndrome)   . Poor dentition   . SIADH (syndrome of inappropriate ADH production) [facility])     Patient Active Problem List   Diagnosis Date Noted  . IBS (irritable bowel syndrome)   . Hypertension   . Colitis   . Alcohol abuse, in remission   . Adjustment disorder with mixed anxiety and depressed mood 05/11/2017  . Dehydration 12/13/2016  . Alcohol abuse   .  SOB (shortness of breath)   . Chest pain 11/11/2016  . Anxiety   . Gastroesophageal reflux disease   . Osteopenia determined by x-ray 08/06/2014  . Stress fracture of calcaneus 08/06/2014  . Vitamin D deficiency 12/18/2013  . Dementia 12/18/2013  . Benign microscopic hematuria 07/06/2013  . SIADH (syndrome of inappropriate ADH production) ([place]) 06/22/2013  . Hyponatremia 06/20/2013  . Ankylosing spondylitis ([place]) 06/20/2013  . Malnutrition of moderate degree ([place]) 06/20/2013  . BPH (benign prostatic hyperplasia) 08/22/2010  . Backache 04/19/2010  . ALCOHOLISM 08/13/2008  . History of colonic polyps 08/13/2008  . Essential hypertension 07/03/2007  . PUD (peptic ulcer disease) 07/03/2007  . Irritable bowel syndrome 07/03/2007    Past Surgical History:  Procedure Laterality Date  . COLONOSCOPY W/ BIOPSIES    . ESOPHAGOGASTRODUODENOSCOPY    . HERNIA REPAIR Bilateral   . TONSILLECTOMY          Home Medications    Prior to Admission medications   Medication Sig Start Date End Date Taking? Authorizing Provider  acetaminophen (TYLENOL) 500 MG tablet Take 1,000 mg by mouth every 6 (six) hours as needed (For headache and back pain.).     [provider]  chlordiazePOXIDE (LIBRIUM) 25 MG capsule  Take 2 capsules (50 mg total) by mouth 3 (three) times daily as needed for anxiety. 22m by mouth q 6 h day 1 546mby mouth q 8 h day 2 5059my mouth q 12 h day 3 22m40m mough QHS day 4 12/21/17   Little, RachWenda Overland  diazepam (VALIUM) 5 MG tablet Take 1 tablet (5 mg total) by mouth 2 (two) times daily. Patient not taking: Reported on 10/19/2017 09/29/17   HaviIsla Pence  hydrOXYzine (ATARAX/VISTARIL) 25 MG tablet Take 1 tablet (25 mg total) by mouth every 6 (six) hours as needed for anxiety. Patient not taking: Reported on 10/01/2017 06/11/17   Robinson, JordMartiniquePA-C  lisinopril (PRINIVIL,ZESTRIL) 10 MG tablet Take 1 tablet (10 mg total) by mouth daily. Patient taking  differently: Take 10 mg by mouth every evening.  08/21/17 12/21/17  Lacroce, SamaHulen Shouts  LORazepam (ATIVAN) 0.5 MG tablet Take 0.5 mg by mouth 3 (three) times daily as needed for anxiety.    [provider]  Multiple Vitamins-Minerals (MENS ONE DAILY PO) Take 1 tablet by mouth daily.    [provider]  ondansetron (ZOFRAN ODT) 4 MG disintegrating tablet 4mg 15m q4 hours prn nausea/vomit Patient not taking: Reported on 10/01/2017 09/19/17   FloydDeno Etienne pantoprazole (PROTONIX) 40 MG tablet Take 1 tablet (40 mg total) by mouth daily. 04/24/14   JonesJanith Lima sodium chloride (OCEAN) 0.65 % SOLN nasal spray Place 1-2 sprays into both nostrils as needed for congestion.     [provider]  sucralfate (CARAFATE) 1 G tablet Take 1 tablet (1 g total) by mouth 4 (four) times daily -  with meals and at bedtime. Chew before swallowing 12/17/13   JonesJanith Lima traZODone (DESYREL) 100 MG tablet Take 100 mg by mouth at bedtime. 11/29/17   [provider]  verapamil (CALAN-SR) 240 MG CR tablet Take 240 mg by mouth at bedtime. 02/09/17   [provider]  zolpidem (AMBIEN) 10 MG tablet Take 1 tablet (10 mg total) by mouth at bedtime as needed for sleep. Patient taking differently: Take 10 mg by mouth at bedtime.  05/13/17 12/21/17  KhatrDelia HeadyC    Family History Family History  Problem Relation Age of Onset  . Anxiety disorder Mother   . Congestive Heart Failure Mother   . Crohn's disease Mother   . Colon cancer Mother 60  .62rthritis Father   . High blood pressure Father     Social History Social History   Tobacco Use  . Smoking status: Former Smoker    Types: Cigarettes  . Smokeless tobacco: Never Used  Substance Use Topics  . Alcohol use: Yes    Comment: daily/ 3-4 beers  . Drug use: No     Allergies   Diphenhydramine hcl; Flexeril [cyclobenzaprine]; Hctz [hydrochlorothiazide]; and Nsaids   Review of Systems Review of  Systems  Constitutional: Positive for fatigue. Negative for chills, diaphoresis and fever.  HENT: Negative for congestion, rhinorrhea and sneezing.   Eyes: Negative.   Respiratory: Negative for cough, chest tightness and shortness of breath.   Cardiovascular: Negative for chest pain and leg swelling.  Gastrointestinal: Positive for diarrhea. Negative for abdominal pain, blood in stool, nausea and vomiting.  Genitourinary: Negative for difficulty urinating, flank pain, frequency and hematuria.  Musculoskeletal: Negative for arthralgias and back pain.  Skin: Negative for rash.  Neurological: Negative for dizziness, speech difficulty, weakness, numbness and headaches.  Psychiatric/Behavioral: Positive for dysphoric mood and sleep disturbance. Negative for self-injury and suicidal ideas. The patient is nervous/anxious.      Physical Exam Updated Vital Signs BP (!) 155/91   Pulse 73   Temp 98.6 F (37 C) (Oral)   Resp 17   SpO2 100%   Physical Exam  Constitutional: He is oriented to person, place, and time. He appears well-developed and well-nourished.  Appears disheveled  HENT:  Head: Normocephalic and atraumatic.  Eyes: Pupils are equal, round, and reactive to light.  Neck: Normal range of motion. Neck supple.  Cardiovascular: Normal rate, regular rhythm and normal heart sounds.  Pulmonary/Chest: Effort normal and breath sounds normal. No respiratory distress. He has no wheezes. He has no rales. He exhibits no tenderness.  Abdominal: Soft. Bowel sounds are normal. There is no tenderness. There is no rebound and no guarding.  Musculoskeletal: Normal range of motion. He exhibits no edema.  Lymphadenopathy:    He has no cervical adenopathy.  Neurological: He is alert and oriented to person, place, and time.  Skin: Skin is warm and dry. No rash noted.  Psychiatric: He has a normal mood and affect.     ED Treatments / Results  Labs (all labs ordered are listed, but only abnormal  results are displayed) Labs Reviewed  BASIC METABOLIC PANEL - Abnormal; Notable for the following components:      Result Value   Sodium 131 (*)    Chloride 92 (*)    Glucose, Bld 121 (*)    BUN 5 (*)    All other components within normal limits  CBC WITH DIFFERENTIAL/PLATELET    EKG None  Radiology No results found.  Procedures Procedures (including critical care time)  Medications Ordered in ED Medications  sodium chloride 0.9 % bolus 1,000 mL (1,000 mLs Intravenous New Bag/Given 01/05/18 0910)  LORazepam (ATIVAN) injection 1 mg (1 mg Intravenous Given 01/05/18 0911)     Initial Impression / Assessment and Plan / ED Course  I have reviewed the triage vital signs and the nursing notes.  Pertinent labs & imaging results that were available during my care of the patient were reviewed by me and considered in my medical decision making (see chart for details).     Patient is a 62 year old male who presents with anxiety and depression.  He is not suicidal.  He has no hallucinations or evidence of psychosis.  I do not feel that he needs a TTS evaluation.  He is requesting something for his anxiety and IV fluids because he feels like he is dehydrated.  He was given a dose of Ativan and IV fluids.  His labs are non-concerning.  He has a slight tremor which is likely from alcohol withdrawal and his anxiety.  He has no tachycardia.  No confusion.  He states his last alcohol intake was last night.  He denies any for any social work consult.  He was discharged home in good condition.  He was encouraged to follow-up with his PCP.  Final Clinical Impressions(s) / ED Diagnoses   Final diagnoses:  Anxiety    ED Discharge Orders    None       Malvin Johns, MD 01/05/18 1046

## 2018-01-05 NOTE — ED Notes (Signed)
Bed: WA02 Expected date:  Expected time:  Means of arrival:  Comments: Psych

## 2018-01-11 ENCOUNTER — Encounter (HOSPITAL_COMMUNITY): Payer: Self-pay | Admitting: Emergency Medicine

## 2018-01-11 ENCOUNTER — Emergency Department (HOSPITAL_COMMUNITY)
Admission: EM | Admit: 2018-01-11 | Discharge: 2018-01-11 | Disposition: A | Discharge status: Home / Self Care | Payer: Medicaid Other | Attending: Emergency Medicine | Admitting: Emergency Medicine

## 2018-01-11 DIAGNOSIS — F419 Anxiety disorder, unspecified: Secondary | ICD-10-CM | POA: Diagnosis present

## 2018-01-11 DIAGNOSIS — F10231 Alcohol dependence with withdrawal delirium: Secondary | ICD-10-CM | POA: Diagnosis not present

## 2018-01-11 DIAGNOSIS — I10 Essential (primary) hypertension: Secondary | ICD-10-CM | POA: Insufficient documentation

## 2018-01-11 DIAGNOSIS — Z87891 Personal history of nicotine dependence: Secondary | ICD-10-CM | POA: Diagnosis not present

## 2018-01-11 DIAGNOSIS — Z79899 Other long term (current) drug therapy: Secondary | ICD-10-CM | POA: Insufficient documentation

## 2018-01-11 DIAGNOSIS — F1010 Alcohol abuse, uncomplicated: Secondary | ICD-10-CM

## 2018-01-11 HISTORY — DX: Noninfective gastroenteritis and colitis, unspecified: K52.9

## 2018-01-11 LAB — COMPREHENSIVE METABOLIC PANEL
ALK PHOS: 77 U/L (ref 38–126)
ALT: 16 U/L (ref 0–44)
ANION GAP: 12 (ref 5–15)
AST: 21 U/L (ref 15–41)
Albumin: 4.1 g/dL (ref 3.5–5.0)
BILIRUBIN TOTAL: 0.5 mg/dL (ref 0.3–1.2)
BUN: <5 mg/dL — ABNORMAL LOW (ref 8–23)
CALCIUM: 9.1 mg/dL (ref 8.9–10.3)
CO2: 26 mmol/L (ref 22–32)
CREATININE: 0.78 mg/dL (ref 0.61–1.24)
Chloride: 98 mmol/L (ref 98–111)
GFR calc Af Amer: >60 mL/min (ref >60)
Glucose, Bld: 100 mg/dL — ABNORMAL HIGH (ref 70–99)
Potassium: 3.8 mmol/L (ref 3.5–5.1)
Sodium: 136 mmol/L (ref 135–145)
TOTAL PROTEIN: 7.3 g/dL (ref 6.5–8.1)

## 2018-01-11 LAB — LIPASE, BLOOD: LIPASE: 30 U/L (ref 11–51)

## 2018-01-11 LAB — URINALYSIS, ROUTINE W REFLEX MICROSCOPIC
BACTERIA UA: NONE SEEN
BILIRUBIN URINE: NEGATIVE
Glucose, UA: NEGATIVE mg/dL
KETONES UR: NEGATIVE mg/dL
Leukocytes, UA: NEGATIVE
NITRITE: NEGATIVE
Protein, ur: NEGATIVE mg/dL
SPECIFIC GRAVITY, URINE: 1.004 — AB (ref 1.005–1.030)
pH: 7 (ref 5.0–8.0)

## 2018-01-11 LAB — RAPID URINE DRUG SCREEN, HOSP PERFORMED
Amphetamines: NOT DETECTED
BENZODIAZEPINES: POSITIVE — AB
Barbiturates: NOT DETECTED
Cocaine: NOT DETECTED
OPIATES: NOT DETECTED
Tetrahydrocannabinol: NOT DETECTED

## 2018-01-11 LAB — CBC WITH DIFFERENTIAL/PLATELET
BASOS ABS: 0.1 10*3/uL (ref 0.0–0.1)
BASOS PCT: 1 %
EOS ABS: 0.1 10*3/uL (ref 0.0–0.7)
EOS PCT: 1 %
HCT: 45.6 % (ref 39.0–52.0)
Hemoglobin: 15.4 g/dL (ref 13.0–17.0)
LYMPHS ABS: 1.4 10*3/uL (ref 0.7–4.0)
Lymphocytes Relative: 17 %
MCH: 31 pg (ref 26.0–34.0)
MCHC: 33.8 g/dL (ref 30.0–36.0)
MCV: 91.9 fL (ref 78.0–100.0)
Monocytes Absolute: 0.9 10*3/uL (ref 0.1–1.0)
Monocytes Relative: 11 %
NEUTROS PCT: 70 %
Neutro Abs: 5.9 10*3/uL (ref 1.7–7.7)
PLATELETS: 348 10*3/uL (ref 150–400)
RBC: 4.96 MIL/uL (ref 4.22–5.81)
RDW: 13.1 % (ref 11.5–15.5)
WBC: 8.3 10*3/uL (ref 4.0–10.5)

## 2018-01-11 LAB — ETHANOL

## 2018-01-11 MED ORDER — LORAZEPAM 2 MG/ML IJ SOLN: 0.0000 mg | Freq: Four times a day (QID) | INTRAMUSCULAR | Status: DC

## 2018-01-11 MED ORDER — LORAZEPAM 1 MG PO TABS
0.0000 mg | ORAL_TABLET | Freq: Two times a day (BID) | ORAL | Status: DC
Start: 2018-01-13 — End: 2018-01-11

## 2018-01-11 MED ORDER — THIAMINE HCL 100 MG/ML IJ SOLN: 100.0000 mg | Freq: Every day | INTRAMUSCULAR | Status: DC

## 2018-01-11 MED ORDER — LORAZEPAM 2 MG/ML IJ SOLN
0.0000 mg | Freq: Two times a day (BID) | INTRAMUSCULAR | Status: DC
Start: 2018-01-13 — End: 2018-01-11

## 2018-01-11 MED ORDER — LORAZEPAM 1 MG PO TABS
0.0000 mg | ORAL_TABLET | Freq: Four times a day (QID) | ORAL | Status: DC
  Administered 2018-01-11: 1 mg via ORAL
  Filled 2018-01-11: qty 1

## 2018-01-11 MED ORDER — CHLORDIAZEPOXIDE HCL 25 MG PO CAPS: ORAL_CAPSULE | ORAL | 0 refills | Status: DC

## 2018-01-11 MED ORDER — VITAMIN B-1 100 MG PO TABS
100.0000 mg | ORAL_TABLET | Freq: Every day | ORAL | Status: DC
  Administered 2018-01-11: 100 mg via ORAL
  Filled 2018-01-11: qty 1

## 2018-01-11 NOTE — ED Notes (Signed)
Pt provided with sandwich at this time

## 2018-01-11 NOTE — ED Notes (Signed)
MD at bedside. 

## 2018-01-11 NOTE — ED Triage Notes (Signed)
Pt reports that he is dehydrated due to not eating in several days but has bottle water that came in with and able to drink without difficulty. Reports that hasnt had ETOH in couple nights and having shakes, diarrhea. Reports out of his anxiety medications and not time for refill yet so cant get anymore for several days. Reports that he is about to be homeless in couple days so having stress about that.

## 2018-01-11 NOTE — ED Provider Notes (Signed)
[facility] DEPT Provider Note   CSN: [id number] Arrival date & time: 01/11/18  3244     History   Chief Complaint Chief Complaint  Patient presents with  . Delirium Tremens (DTS)  . Headache  . Anxiety    HPI Anthony Lamb is a 62 y.o. male.  HPI  Pt was seen at 0850. Per pt, c/o gradual onset and persistence of constant "anxiety" for the past several days. Pt states he has not drank etoh in the past several days and is "having the shakes." Pt also states he has not been eating or drinking in the past few days because of his anxiety and feels he is "dehydrated." Pt states he has run out of his ativan and is not due for another refill for "another few days." Pt states he has a "SW lady helping to get me into an assisted living facility." States "the paperwork is filled out." Denies any change in his usual chronic diarrhea. Denies N/V, no abd pain, no back pain, no CP/SOB, no fevers, no SI/HI/AVH.   Past Medical History:  Diagnosis Date  . Alcohol abuse, in remission   . Anemia   . Ankylosing spondylitis ([place])   . Anxiety   . Chronic diarrhea   . Chronic pain   . Colitis   . Depression   . Esophagitis   . Gastritis   . GERD (gastroesophageal reflux disease)   . Hypertension   . IBS (irritable bowel syndrome)   . Poor dentition   . SIADH (syndrome of inappropriate ADH production) [facility])     Patient Active Problem List   Diagnosis Date Noted  . IBS (irritable bowel syndrome)   . Hypertension   . Colitis   . Alcohol abuse, in remission   . Adjustment disorder with mixed anxiety and depressed mood 05/11/2017  . Dehydration 12/13/2016  . Alcohol abuse   . SOB (shortness of breath)   . Chest pain 11/11/2016  . Anxiety   . Gastroesophageal reflux disease   . Osteopenia determined by x-ray 08/06/2014  . Stress fracture of calcaneus 08/06/2014  . Vitamin D deficiency 12/18/2013  . Dementia 12/18/2013  . Benign microscopic hematuria  07/06/2013  . SIADH (syndrome of inappropriate ADH production) ([place]) 06/22/2013  . Hyponatremia 06/20/2013  . Ankylosing spondylitis ([place]) 06/20/2013  . Malnutrition of moderate degree ([place]) 06/20/2013  . BPH (benign prostatic hyperplasia) 08/22/2010  . Backache 04/19/2010  . ALCOHOLISM 08/13/2008  . History of colonic polyps 08/13/2008  . Essential hypertension 07/03/2007  . PUD (peptic ulcer disease) 07/03/2007  . Irritable bowel syndrome 07/03/2007    Past Surgical History:  Procedure Laterality Date  . COLONOSCOPY W/ BIOPSIES    . ESOPHAGOGASTRODUODENOSCOPY    . HERNIA REPAIR Bilateral   . TONSILLECTOMY          Home Medications    Prior to Admission medications   Medication Sig Start Date End Date Taking? Authorizing Provider  acetaminophen (TYLENOL) 500 MG tablet Take 1,000 mg by mouth every 6 (six) hours as needed (For headache and back pain.).     [provider]  chlordiazePOXIDE (LIBRIUM) 25 MG capsule Take 2 capsules (50 mg total) by mouth 3 (three) times daily as needed for anxiety. 55m by mouth q 6 h day 1 579mby mouth q 8 h day 2 5021my mouth q 12 h day 3 62m59m mough QHS day 4 12/21/17   Little, RachWenda Overland  diazepam (VALIUM) 5 MG  tablet Take 1 tablet (5 mg total) by mouth 2 (two) times daily. Patient not taking: Reported on 10/19/2017 09/29/17   [name], MD  hydrOXYzine (ATARAX/VISTARIL) 25 MG tablet Take 1 tablet (25 mg total) by mouth every 6 (six) hours as needed for anxiety. Patient not taking: Reported on 10/01/2017 06/11/17   Robinson, [place] N, PA-C  lisinopril (PRINIVIL,ZESTRIL) 10 MG tablet Take 1 tablet (10 mg total) by mouth daily. Patient taking differently: Take 10 mg by mouth every evening.  08/21/17 12/21/17  Lacroce, Hulen Shouts, MD  LORazepam (ATIVAN) 0.5 MG tablet Take 0.5 mg by mouth 3 (three) times daily as needed for anxiety.    [provider]  Multiple Vitamins-Minerals (MENS ONE DAILY PO) Take 1 tablet by  mouth daily.    [provider]  ondansetron (ZOFRAN ODT) 4 MG disintegrating tablet 2m ODT q4 hours prn nausea/vomit Patient not taking: Reported on 10/01/2017 09/19/17   FDeno Etienne DO  pantoprazole (PROTONIX) 40 MG tablet Take 1 tablet (40 mg total) by mouth daily. 04/24/14   JJanith Lima MD  sodium chloride (OCEAN) 0.65 % SOLN nasal spray Place 1-2 sprays into both nostrils as needed for congestion.     [provider]  sucralfate (CARAFATE) 1 G tablet Take 1 tablet (1 g total) by mouth 4 (four) times daily -  with meals and at bedtime. Chew before swallowing 12/17/13   JJanith Lima MD  traZODone (DESYREL) 100 MG tablet Take 100 mg by mouth at bedtime. 11/29/17   [provider]  verapamil (CALAN-SR) 240 MG CR tablet Take 240 mg by mouth at bedtime. 02/09/17   [provider]  zolpidem (AMBIEN) 10 MG tablet Take 1 tablet (10 mg total) by mouth at bedtime as needed for sleep. Patient taking differently: Take 10 mg by mouth at bedtime.  05/13/17 12/21/17  KDelia Heady PA-C    Family History Family History  Problem Relation Age of Onset  . Anxiety disorder Mother   . Congestive Heart Failure Mother   . Crohn's disease Mother   . Colon cancer Mother 69 . Arthritis Father   . High blood pressure Father     Social History Social History   Tobacco Use  . Smoking status: Former Smoker    Types: Cigarettes  . Smokeless tobacco: Never Used  Substance Use Topics  . Alcohol use: Yes    Comment: daily/ 3-4 beers  . Drug use: No     Allergies   Diphenhydramine hcl; Flexeril [cyclobenzaprine]; Hctz [hydrochlorothiazide]; and Nsaids   Review of Systems Review of Systems ROS: Statement: All systems negative except as marked or noted in the HPI; Constitutional: Negative for fever and chills. ; ; Eyes: Negative for eye pain, redness and discharge. ; ; ENMT: Negative for ear pain, hoarseness, nasal congestion, sinus pressure and sore throat. ; ;  Cardiovascular: Negative for chest pain, palpitations, diaphoresis, dyspnea and peripheral edema. ; ; Respiratory: Negative for cough, wheezing and stridor. ; ; Gastrointestinal: Negative for nausea, vomiting, diarrhea, abdominal pain, blood in stool, hematemesis, jaundice and rectal bleeding. . ; ; Genitourinary: Negative for dysuria, flank pain and hematuria. ; ; Musculoskeletal: Negative for back pain and neck pain. Negative for swelling and trauma.; ; Skin: Negative for pruritus, rash, abrasions, blisters, bruising and skin lesion.; ; Neuro: Negative for headache, lightheadedness and neck stiffness. Negative for weakness, altered level of consciousness, altered mental status, extremity weakness, paresthesias, involuntary movement, seizure and syncope.; Psych:  +anxiety. No SI,  no SA, no HI, no hallucinations.        Physical Exam Updated Vital Signs BP (!) 165/91 (BP Location: Right Arm)   Pulse 81   Temp 98 F (36.7 C) (Oral)   Resp 20   SpO2 96%   Physical Exam 0855: Physical examination:  Nursing notes reviewed; Vital signs and O2 SAT reviewed;  Constitutional: Well developed, Well nourished, Well hydrated, In no acute distress; Head:  Normocephalic, atraumatic; Eyes: EOMI, PERRL, No scleral icterus; ENMT: Mouth and pharynx normal, Mucous membranes moist; Neck: Supple, Full range of motion, No lymphadenopathy; Cardiovascular: Regular rate and rhythm, No gallop; Respiratory: Breath sounds clear & equal bilaterally, No wheezes.  Speaking full sentences with ease, Normal respiratory effort/excursion; Chest: Nontender, Movement normal; Abdomen: Soft, Nontender, Nondistended, Normal bowel sounds; Genitourinary: No CVA tenderness; Extremities: Peripheral pulses normal, No tenderness, No edema, No calf edema or asymmetry.; Neuro: AA&Ox3, Major CN grossly intact.  Speech clear. No gross focal motor or sensory deficits in extremities. Climbs on and off stretcher easily by himself. Walking around exam  room, gait steady. Drinking bottled water. Urinal at bedside: clear/yellow urine in urinal..; Skin: Color normal, Warm, Dry.; Psych:  Denies SI.    ED Treatments / Results  Labs (all labs ordered are listed, but only abnormal results are displayed)   EKG None  Radiology  Procedures Procedures (including critical care time)  Medications Ordered in ED Medications  LORazepam (ATIVAN) injection 0-4 mg (has no administration in time range)    Or  LORazepam (ATIVAN) tablet 0-4 mg (has no administration in time range)  LORazepam (ATIVAN) injection 0-4 mg (has no administration in time range)    Or  LORazepam (ATIVAN) tablet 0-4 mg (has no administration in time range)  thiamine (VITAMIN B-1) tablet 100 mg (has no administration in time range)    Or  thiamine (B-1) injection 100 mg (has no administration in time range)     Initial Impression / Assessment and Plan / ED Course  I have reviewed the triage vital signs and the nursing notes.  Pertinent labs & imaging results that were available during my care of the patient were reviewed by me and considered in my medical decision making (see chart for details).  MDM Reviewed: previous chart, nursing note and vitals Reviewed previous: labs Interpretation: labs    Results for orders placed or performed during the hospital encounter of 01/11/18  Comprehensive metabolic panel  Result Value Ref Range   Sodium 136 135 - 145 mmol/L   Potassium 3.8 3.5 - 5.1 mmol/L   Chloride 98 98 - 111 mmol/L   CO2 26 22 - 32 mmol/L   Glucose, Bld 100 (H) 70 - 99 mg/dL   BUN <5 (L) 8 - 23 mg/dL   Creatinine, Ser 0.78 0.61 - 1.24 mg/dL   Calcium 9.1 8.9 - 10.3 mg/dL   Total Protein 7.3 6.5 - 8.1 g/dL   Albumin 4.1 3.5 - 5.0 g/dL   AST 21 15 - 41 U/L   ALT 16 0 - 44 U/L   Alkaline Phosphatase 77 38 - 126 U/L   Total Bilirubin 0.5 0.3 - 1.2 mg/dL   GFR calc non Af Amer >60 >60 mL/min   GFR calc Af Amer >60 >60 mL/min   Anion gap 12 5 - 15    Ethanol  Result Value Ref Range   Alcohol, Ethyl (B) <10 <10 mg/dL  CBC with Differential  Result Value Ref Range   WBC 8.3 4.0 -  10.5 K/uL   RBC 4.96 4.22 - 5.81 MIL/uL   Hemoglobin 15.4 13.0 - 17.0 g/dL   HCT 45.6 39.0 - 52.0 %   MCV 91.9 78.0 - 100.0 fL   MCH 31.0 26.0 - 34.0 pg   MCHC 33.8 30.0 - 36.0 g/dL   RDW 13.1 11.5 - 15.5 %   Platelets 348 150 - 400 K/uL   Neutrophils Relative % 70 %   Neutro Abs 5.9 1.7 - 7.7 K/uL   Lymphocytes Relative 17 %   Lymphs Abs 1.4 0.7 - 4.0 K/uL   Monocytes Relative 11 %   Monocytes Absolute 0.9 0.1 - 1.0 K/uL   Eosinophils Relative 1 %   Eosinophils Absolute 0.1 0.0 - 0.7 K/uL   Basophils Relative 1 %   Basophils Absolute 0.1 0.0 - 0.1 K/uL  Lipase, blood  Result Value Ref Range   Lipase 30 11 - 51 U/L  Urine rapid drug screen (hosp performed)  Result Value Ref Range   Opiates NONE DETECTED NONE DETECTED   Cocaine NONE DETECTED NONE DETECTED   Benzodiazepines POSITIVE (A) NONE DETECTED   Amphetamines NONE DETECTED NONE DETECTED   Tetrahydrocannabinol NONE DETECTED NONE DETECTED   Barbiturates NONE DETECTED NONE DETECTED  Urinalysis, Routine w reflex microscopic  Result Value Ref Range   Color, Urine STRAW (A) YELLOW   APPearance CLEAR CLEAR   Specific Gravity, Urine 1.004 (L) 1.005 - 1.030   pH 7.0 5.0 - 8.0   Glucose, UA NEGATIVE NEGATIVE mg/dL   Hgb urine dipstick SMALL (A) NEGATIVE   Bilirubin Urine NEGATIVE NEGATIVE   Ketones, ur NEGATIVE NEGATIVE mg/dL   Protein, ur NEGATIVE NEGATIVE mg/dL   Nitrite NEGATIVE NEGATIVE   Leukocytes, UA NEGATIVE NEGATIVE   RBC / HPF 0-5 0 - 5 RBC/hpf   WBC, UA 0-5 0 - 5 WBC/hpf   Bacteria, UA NONE SEEN NONE SEEN     0855:  Pt states he has "run out of ativan" and "needs some." [place] PMP Database accessed: pt filled ativan 26m tabs, #90, on 12/26/2017. I explained to pt that, given this information, I cannot rx him any further ativan at this time. Pt became agitated regarding this and  demanded "I need some here!" and "some blood work too!"   1030:  SW has evaluated pt in ED (see separate note): no further SW issues at this time.   1140:  Pt insistent to multiple ED staff he is ready to go home and "needs a rx for ativan." Pt aware I will not rx him more ativan. Pt states then he "needs a librium rx" because of "alcohol withdrawal." Pt's CIWA has improved after protocol dose given in ED, no outward s/s withdrawal. Pt has tol PO well without N/V. Pt has ambulated with steady gait, easy resps, NAD. Will rx short course librium. Dx and testing d/w pt.  Questions answered.  Verb understanding, agreeable to d/c home with outpt f/u.      Final Clinical Impressions(s) / ED Diagnoses   Final diagnoses:  None    ED Discharge Orders    None       MFrancine Graven DO 01/12/18 0800

## 2018-01-11 NOTE — ED Notes (Signed)
Social Worker at bedside

## 2018-01-11 NOTE — Discharge Instructions (Signed)
Take the prescription as directed.  Call your regular medical doctor and your mental health provider today to schedule a follow up appointment within the next 2 days.  Return to the Emergency Department immediately sooner if worsening.    Substance Abuse Treatment Programs  Intensive Outpatient Programs [facility]     [address], [place]       [facility] [place], [place]  [facility]     (Inpatient and outpatient)     [address]           [place] [phone] (Suboxone and Methadone)  [place], [place] [address]      [place] [address] [place], [place]  [facility] (Outpatient/Inpatient, [place])    (insurance only) [phone]             [facility] ([place]) [place], [place]     [facility]     [address]     [place], [place]       [facility] (for caregivers and family) [phone] [address] Kristeen Mans. [place], [place]      Residential Treatment Programs [facility]      [address] [place], [place] [address]  [phone]       [facility] [address], [place], [place] [address] [phone]  [facility]        [phone]       [facility] [phone]  [facility] ([place]             [place], [place] or [place] of [place], [address] [phone]  [facility] [place]    [address]      [place], [place]       [facility] [address] [place], [place]  [place]   [address] [place],  [address]     [phone]      Admissions: 8am-3pm M-F  [facility] (RTS) [address] [place], [place]  [facility] Program: Residential Program [phone] Days)   [place], [place] or [phone]     ADATC: [facility] [place], [place] (Walk in Hours over the weekend or by referral)  [facility]  [place], [place], [place] [address] [phone]  Crisis Mobile: [facility]:  [phone] (for crisis response 24 hours a day) [facility] Hotline:      [phone] Outpatient Psychiatry and Counseling  [facility]: Mobile Crisis Management 24 hours:  [phone]  [facility] of the [employer] sliding scale fee and walk in schedule: M-F 8am-12pm/1pm-3pm [place], [place] [address] [place] [place], [place] [address] [phone]  [facility] (Formerly known as [facility] [employer])- new patient walk-in appointments available Monday - Friday 8am -3pm.          [address] [place], [place] [address] [phone] or crisis line- [facility]/ Intensive Outpatient Therapy Program [place], [place] [address] [place]      [phone] [address] [place], [place] [address]                 [place]   [facility] [phone]. [place], [place] [address]   [employer] of Care          [address] Johnette Abraham  [place], [place] [address]       [phone]  [facility] [address] [place], [place] [address] [phone]  [facility]    [address] [place], [place] [address]     [place], [place] Joycelyn Man     [place] [address]     [phone]       [facility] [place] [address]  [facility]     [address], [place], [place], [place] [address] [place]     [address]     [place], [place] [address]     [phone]       [facility] [address] [place], [place] [address] [phone] Resources for Temporary Residential Assistance/Crisis [place]) M-F 8am-3pm   [address], [place] [address]   [phone] Services include: laundry, barbering, support groups, case management, phone  & computer access, showers, AA/NA mtgs, mental health/substance abuse nurse, job skills class, disability information, VA assistance, spiritual classes, etc.   [facility]   [address] [place]     [place]              [employer] (women and children)       [place]. [place],  [address] [phone] [email or web address] for application and process Application Required  [facility]   [address] [address]     [phone]                    [place]  [address] [place], [place] [address] [phone](IYMEBRAX application appt.) Application Required  [facility] (women only)    [address]     [place], [place] [address]     [phone]      Intake starts 6pm daily Need valid ID, SSC, & Police report [employer] [address] [place], [place] [phone] Application  Required  [employer] (men only)     [place].      [employer] [place], [place]       [place] (Pregnant women only) [address] [place], [place]  [facility]      [place] Dani [place].      [place], [place] [address]     [phone]             [facility] [address] [place], [place] 90 day commitment/SA/Application process  [facility](men only)     [address]     [place], [place]       Check-in at [facility] [address] [place], [place] [address] [phone] Men/Women/Women and Children must be there by 7 pm  [place], [place]

## 2018-01-11 NOTE — ED Notes (Signed)
Pt reports that he found a ride and he doesn't need a voucher or an uber that SW arranged. Pt wheeled to the lobby in stable condition.

## 2018-01-11 NOTE — ED Notes (Addendum)
Pt requesting cab voucher. Pt states " the social worker said she would get me a cab voucher. Can the taxi take me to the drug store so I can get my medication?" Social worker called. SW to inquire about a voucher. Awaiting return phone call.

## 2018-01-11 NOTE — ED Notes (Signed)
Pt fixated on being discharged with an Ativan RX.

## 2018-01-11 NOTE — Progress Notes (Addendum)
10:17am- CSW spoke with [employer] who states that patient qualifies for Care Coordination. LP also states that they will expedite the process and patient should be connected with someone early next week. CSW will update patient and Dyann Ruddle of new information.   CSW spoke with patient at bedside. Patient states that he currently lives in an apartment by himself but has to be out by 02/02/18. Patient states he is unable to take care of himself and is working with someone who is helping him find a place to go for more assistance. CSW received verbal permission to speak to Elsie Amis at [phone]. CSW reached out to [place] who reports she a Group Chiropodist at [employer] and an acquaintance of the family. Per Dyann Ruddle she has been advocating to get patient the necessary forms to start the process of getting placed in a group home.  Per Dyann Ruddle, patient is not set up with an LME-MCO but has Medicaid and would fall into [place] area. Dyann Ruddle also stated that patient is fully-disabled. CSW to contact [facility] to set up Care Coordination. CSW will continue to follow.   Ollen Barges, [place] Work Department  [employer]  [phone]

## 2018-01-29 ENCOUNTER — Emergency Department (HOSPITAL_COMMUNITY)
Admission: EM | Admit: 2018-01-29 | Discharge: 2018-01-29 | Disposition: A | Discharge status: Home / Self Care | Payer: Medicaid Other | Attending: Emergency Medicine | Admitting: Emergency Medicine

## 2018-01-29 ENCOUNTER — Encounter (HOSPITAL_COMMUNITY): Payer: Self-pay

## 2018-01-29 ENCOUNTER — Other Ambulatory Visit: Payer: Self-pay

## 2018-01-29 DIAGNOSIS — E871 Hypo-osmolality and hyponatremia: Secondary | ICD-10-CM | POA: Diagnosis not present

## 2018-01-29 DIAGNOSIS — I10 Essential (primary) hypertension: Secondary | ICD-10-CM

## 2018-01-29 DIAGNOSIS — R3121 Asymptomatic microscopic hematuria: Secondary | ICD-10-CM | POA: Diagnosis not present

## 2018-01-29 DIAGNOSIS — Z87891 Personal history of nicotine dependence: Secondary | ICD-10-CM | POA: Diagnosis not present

## 2018-01-29 DIAGNOSIS — E860 Dehydration: Secondary | ICD-10-CM | POA: Insufficient documentation

## 2018-01-29 DIAGNOSIS — Z79899 Other long term (current) drug therapy: Secondary | ICD-10-CM | POA: Insufficient documentation

## 2018-01-29 DIAGNOSIS — F430 Acute stress reaction: Secondary | ICD-10-CM | POA: Insufficient documentation

## 2018-01-29 DIAGNOSIS — R531 Weakness: Secondary | ICD-10-CM | POA: Diagnosis not present

## 2018-01-29 DIAGNOSIS — F0390 Unspecified dementia without behavioral disturbance: Secondary | ICD-10-CM | POA: Insufficient documentation

## 2018-01-29 DIAGNOSIS — R82998 Other abnormal findings in urine: Secondary | ICD-10-CM | POA: Diagnosis present

## 2018-01-29 LAB — URINALYSIS, ROUTINE W REFLEX MICROSCOPIC
BILIRUBIN URINE: NEGATIVE
GLUCOSE, UA: NEGATIVE mg/dL
KETONES UR: NEGATIVE mg/dL
LEUKOCYTES UA: NEGATIVE
Nitrite: NEGATIVE
Protein, ur: NEGATIVE mg/dL
Specific Gravity, Urine: 1.01 (ref 1.005–1.030)
pH: 7 (ref 5.0–8.0)

## 2018-01-29 LAB — BASIC METABOLIC PANEL
Anion gap: 11 (ref 5–15)
BUN: 5 mg/dL — ABNORMAL LOW (ref 8–23)
CALCIUM: 9.2 mg/dL (ref 8.9–10.3)
CO2: 26 mmol/L (ref 22–32)
CREATININE: 0.73 mg/dL (ref 0.61–1.24)
Chloride: 95 mmol/L — ABNORMAL LOW (ref 98–111)
GFR calc Af Amer: >60 mL/min (ref >60)
GLUCOSE: 105 mg/dL — AB (ref 70–99)
Potassium: 4.7 mmol/L (ref 3.5–5.1)
SODIUM: 132 mmol/L — AB (ref 135–145)

## 2018-01-29 LAB — CBC WITH DIFFERENTIAL/PLATELET
BASOS ABS: 0 10*3/uL (ref 0.0–0.1)
BASOS PCT: 0 %
EOS PCT: 0 %
Eosinophils Absolute: 0 10*3/uL (ref 0.0–0.7)
HCT: 42.1 % (ref 39.0–52.0)
Hemoglobin: 14.8 g/dL (ref 13.0–17.0)
Lymphocytes Relative: 12 %
Lymphs Abs: 1.1 10*3/uL (ref 0.7–4.0)
MCH: 31.3 pg (ref 26.0–34.0)
MCHC: 35.2 g/dL (ref 30.0–36.0)
MCV: 89 fL (ref 78.0–100.0)
MONO ABS: 0.9 10*3/uL (ref 0.1–1.0)
MONOS PCT: 10 %
NEUTROS ABS: 7.2 10*3/uL (ref 1.7–7.7)
Neutrophils Relative %: 78 %
PLATELETS: 335 10*3/uL (ref 150–400)
RBC: 4.73 MIL/uL (ref 4.22–5.81)
RDW: 12.7 % (ref 11.5–15.5)
WBC: 9.3 10*3/uL (ref 4.0–10.5)

## 2018-01-29 LAB — ETHANOL

## 2018-01-29 MED ORDER — LORAZEPAM 1 MG PO TABS
1.0000 mg | ORAL_TABLET | Freq: Once | ORAL | Status: AC
  Administered 2018-01-29: 1 mg via ORAL
  Filled 2018-01-29: qty 1

## 2018-01-29 MED ORDER — SODIUM CHLORIDE 0.9 % IV BOLUS
1000.0000 mL | Freq: Once | INTRAVENOUS | Status: AC
  Administered 2018-01-29: 1000 mL via INTRAVENOUS

## 2018-01-29 NOTE — Progress Notes (Addendum)
CSW aware of consult. CSW familiar with patient from last visit 8/9. CSW spoke with patient at bedside who states that he needs assistance with transportation once he is released. Patient is still living in his apartment and still has to be out by the 31st. Patient states that someone came out to visit him yesterday that may be able to accept patient to a group home in [place], [place]. Patient unable to tell CSW the name of the company but gave CSW verbal consent to reach out to Elsie Amis [phone] for more information. CSW attempted to reach out to [place]. CSW left voicemail for return call.   CSW reached out to [facility] regarding care coordination referral placed last visit. Patient currently being followed by Malcolm Metro, Care Coordinator with [facility] Per Severiano Gilbert, patient is also being followed by an outpatient therapist, Jory Sims, through [facility]. Charisse informed CSW that she, Vaughan Basta and Dyann Ruddle are all trying to find patient placement in an [facility] but at this time patient does not have placement. CSW will continue to follow.  11:09am- CSW spoke with patient at bedside regarding housing options once he is no longer able to stay in his apartment. CSW mentioned that patient may be able to stay in a motel until placement is found. Per patient, he has people he needs to pay back and bills he is behind on and will not be able to pay to stay at a motel.  Ollen Barges, [place] Work Department  [employer]  [phone]

## 2018-01-29 NOTE — Discharge Instructions (Signed)
We worked with social work and case management today to help provide her resources.  They reached out to your family friend Dyann Ruddle regarding your living situation.  It sounds like it may take some time to find you ultimate placement, but she is reaching out to your apartment to see if she can extend your rent there.  Please return to the emergency department if you develop any headaches, vision changes, chest pain or shortness of breath, vomiting with nausea, vomiting, or any new or worsening symptoms.  I placed a consult to peer support to help you reduce alcohol use.  They should reach out to you.  Thank you for allowing [place] to participate in your care today.

## 2018-01-29 NOTE — Progress Notes (Signed)
Per family acquaintance Anthony Lamb [phone], who is actively involved in patient's care, patient abuses his benzos and alcohol. CSW consulted with Certified Peer Support Specialist regarding possible substance use treatment. CSW aware patient has Medicaid. Per CPSS, there are 2 facilities that take Medicaid, [facility]. CSW informed by [facility] that [facility] has a waitlist until mid-September. CSW provided [place] with contact information for [employer] and [place]. CSW to provide assistance with transportation once patient is medically cleared for discharge.  Ollen Barges, [place] Work Department  [employer]  [phone]

## 2018-01-29 NOTE — ED Provider Notes (Signed)
[facility] DEPT Provider Note   CSN: [id number] Arrival date & time: 01/29/18  0711     History   Chief Complaint Chief Complaint  Patient presents with  . Fatigue  . decreased appetite  . foul smelling urine    HPI Anthony Lamb is a 62 y.o. male.  HPI  Patient is a 62 year old male with a history of ankylosing spondylitis, alcohol use disorder, anxiety, depression, hypertension, IBS, SIADH presenting for hunger and feeling "dehydrated" as well as foul-smelling urine.  Patient reports that he is in the process of being evicted from his current living situation, and working with a group home supervisor, but does not have bed placement yet.  Patient reports that he has been very anxious over the situation regarding the lack of funds and uncertainty about his living situation.  Patient reports that for the past 2 days, he has been out of food in his apartment, and he drink the last of his alcohol last night, approximately 3 beers.  Patient denies any fevers, chills, nausea, vomiting, chest pain, shortness of breath, abdominal pain.  Patient denies any dysuria, urgency or frequency, but feels his urine is smelling more foul.  Patient denies any SI/HI/AVH.  Last drink 12 hours ago, possibly 3 beers which patient reports is typical for him nightly.  Past Medical History:  Diagnosis Date  . Alcohol abuse, in remission   . Anemia   . Ankylosing spondylitis ([place])   . Anxiety   . Chronic diarrhea   . Chronic pain   . Colitis   . Depression   . Esophagitis   . Gastritis   . GERD (gastroesophageal reflux disease)   . Hypertension   . IBS (irritable bowel syndrome)   . Poor dentition   . SIADH (syndrome of inappropriate ADH production) [facility])     Patient Active Problem List   Diagnosis Date Noted  . IBS (irritable bowel syndrome)   . Hypertension   . Colitis   . Alcohol abuse, in remission   . Adjustment disorder with mixed anxiety and depressed  mood 05/11/2017  . Dehydration 12/13/2016  . Alcohol abuse   . SOB (shortness of breath)   . Chest pain 11/11/2016  . Anxiety   . Gastroesophageal reflux disease   . Osteopenia determined by x-ray 08/06/2014  . Stress fracture of calcaneus 08/06/2014  . Vitamin D deficiency 12/18/2013  . Dementia 12/18/2013  . Benign microscopic hematuria 07/06/2013  . SIADH (syndrome of inappropriate ADH production) ([place]) 06/22/2013  . Hyponatremia 06/20/2013  . Ankylosing spondylitis ([place]) 06/20/2013  . Malnutrition of moderate degree ([place]) 06/20/2013  . BPH (benign prostatic hyperplasia) 08/22/2010  . Backache 04/19/2010  . ALCOHOLISM 08/13/2008  . History of colonic polyps 08/13/2008  . Essential hypertension 07/03/2007  . PUD (peptic ulcer disease) 07/03/2007  . Irritable bowel syndrome 07/03/2007    Past Surgical History:  Procedure Laterality Date  . COLONOSCOPY W/ BIOPSIES    . ESOPHAGOGASTRODUODENOSCOPY    . HERNIA REPAIR Bilateral   . TONSILLECTOMY          Home Medications    Prior to Admission medications   Medication Sig Start Date End Date Taking? Authorizing Provider  acetaminophen (TYLENOL) 500 MG tablet Take 1,000 mg by mouth every 6 (six) hours as needed (For headache and back pain.).    Yes [provider]  lisinopril (PRINIVIL,ZESTRIL) 10 MG tablet Take 1 tablet (10 mg total) by mouth daily. Patient taking differently: Take 10 mg  by mouth at bedtime.  08/21/17 01/29/18 Yes Lacroce, Hulen Shouts, MD  LORazepam (ATIVAN) 1 MG tablet Take 1 mg by mouth 3 (three) times daily as needed for anxiety.  12/26/17  Yes [provider]  pantoprazole (PROTONIX) 40 MG tablet Take 1 tablet (40 mg total) by mouth daily. 04/24/14  Yes Janith Lima, MD  sodium chloride (OCEAN) 0.65 % SOLN nasal spray Place 1-2 sprays into both nostrils as needed for congestion.    Yes [provider]  sucralfate (CARAFATE) 1 G tablet Take 1 tablet (1 g total) by mouth 4 (four)  times daily -  with meals and at bedtime. Chew before swallowing 12/17/13  Yes Janith Lima, MD  verapamil (CALAN-SR) 240 MG CR tablet Take 240 mg by mouth at bedtime. 02/09/17  Yes [provider]  zolpidem (AMBIEN) 10 MG tablet Take 1 tablet (10 mg total) by mouth at bedtime as needed for sleep. Patient taking differently: Take 10 mg by mouth at bedtime.  05/13/17 01/29/18 Yes Khatri, Hina, PA-C  chlordiazePOXIDE (LIBRIUM) 25 MG capsule 71m PO TID x 1D, then 25-554mPO BID X 1D, then 25-5030mO QD X 1D Patient not taking: Reported on 01/29/2018 01/11/18   McMFrancine GravenO  diazepam (VALIUM) 5 MG tablet Take 1 tablet (5 mg total) by mouth 2 (two) times daily. Patient not taking: Reported on 10/19/2017 09/29/17   HavIsla PenceD  hydrOXYzine (ATARAX/VISTARIL) 25 MG tablet Take 1 tablet (25 mg total) by mouth every 6 (six) hours as needed for anxiety. Patient not taking: Reported on 10/01/2017 06/11/17   Robinson, JorMartinique PA-C  ondansetron (ZOFRAN ODT) 4 MG disintegrating tablet 4mg79mT q4 hours prn nausea/vomit Patient not taking: Reported on 10/01/2017 09/19/17   FloyDeno Etienne    Family History Family History  Problem Relation Age of Onset  . Anxiety disorder Mother   . Congestive Heart Failure Mother   . Crohn's disease Mother   . Colon cancer Mother 60  11Arthritis Father   . High blood pressure Father     Social History Social History   Tobacco Use  . Smoking status: Former Smoker    Types: Cigarettes  . Smokeless tobacco: Never Used  Substance Use Topics  . Alcohol use: Yes    Comment: daily/ 3-4 beers  . Drug use: No     Allergies   Diphenhydramine hcl; Flexeril [cyclobenzaprine]; Hctz [hydrochlorothiazide]; and Nsaids   Review of Systems Review of Systems  Constitutional: Positive for fatigue. Negative for chills and fever.  HENT: Negative for congestion and sore throat.   Eyes: Negative for visual disturbance.  Respiratory: Negative for cough, chest  tightness and shortness of breath.   Cardiovascular: Negative for chest pain, palpitations and leg swelling.  Gastrointestinal: Negative for abdominal pain, diarrhea, nausea and vomiting.  Genitourinary: Negative for dysuria and flank pain.       + Foul smelling urine.  Musculoskeletal: Negative for back pain and myalgias.  Skin: Negative for rash.  Neurological: Negative for dizziness, seizures, syncope, light-headedness and headaches.     Physical Exam Updated Vital Signs BP (!) 162/90 (BP Location: Left Arm)   Pulse 88   Temp 97.9 F (36.6 C) (Oral)   Resp 18   Ht 5' 11"  (1.803 m)   Wt 79.4 kg   SpO2 99%   BMI 24.41 kg/m   Physical Exam  Constitutional: He appears well-developed and well-nourished.  Appearing anxious and restless.  HENT:  Head: Normocephalic  and atraumatic.  Mouth/Throat: Oropharynx is clear and moist.  Eyes: Pupils are equal, round, and reactive to light. Conjunctivae and EOM are normal.  Neck: Normal range of motion. Neck supple.  Cardiovascular: Normal rate, regular rhythm, S1 normal and S2 normal.  No murmur heard. Pulmonary/Chest: Effort normal and breath sounds normal. He has no wheezes. He has no rales.  Abdominal: Soft. He exhibits no distension. There is no tenderness. There is no guarding.  Musculoskeletal: Normal range of motion. He exhibits no edema or deformity.  Lymphadenopathy:    He has no cervical adenopathy.  Neurological: He is alert.  Cranial nerves grossly intact. Patient moves extremities symmetrically and with good coordination.  Skin: Skin is warm and dry. No rash noted. No erythema.  Psychiatric: He has a normal mood and affect. His behavior is normal. Judgment and thought content normal.  Nursing note and vitals reviewed.    ED Treatments / Results  Labs (all labs ordered are listed, but only abnormal results are displayed) Labs Reviewed  URINALYSIS, ROUTINE W REFLEX MICROSCOPIC - Abnormal; Notable for the following  components:      Result Value   Hgb urine dipstick MODERATE (*)    Bacteria, UA RARE (*)    All other components within normal limits  BASIC METABOLIC PANEL - Abnormal; Notable for the following components:   Sodium 132 (*)    Chloride 95 (*)    Glucose, Bld 105 (*)    BUN 5 (*)    All other components within normal limits  ETHANOL  CBC WITH DIFFERENTIAL/PLATELET    EKG None  Radiology No results found.  Procedures Procedures (including critical care time)  Medications Ordered in ED Medications  sodium chloride 0.9 % bolus 1,000 mL (0 mLs Intravenous Stopped 01/29/18 1115)  LORazepam (ATIVAN) tablet 1 mg (1 mg Oral Given 01/29/18 1014)     Initial Impression / Assessment and Plan / ED Course  I have reviewed the triage vital signs and the nursing notes.  Pertinent labs & imaging results that were available during my care of the patient were reviewed by me and considered in my medical decision making (see chart for details).  Clinical Course as of Jan 29 1358  Tue Jan 29, 2018  1246 Has history of hyponatremia. C/w prior.   Sodium(!): 132 [AM]  1354 Reassessed. No tremulousness at this time.   [AM]  1358 Discussed with patient. Referred to primary care to continue assessment. No flank pain today and appears chronic over time.   Hgb urine dipstickMarland Kitchen): MODERATE [AM]    Clinical Course User Index [AM] Albesa Seen, PA-C    Patient nontoxic-appearing and in no acute distress.  Patient does appear anxious over his current situation.  Patient's concerns mostly social at this time but will assess for medical clearance.   Work-up significant for microscopic hematuria, but no associated flank pain or abdominal pain.  Patient has chronically low sodium 132 today.  Patient has had no seizure-like activity or altered mental status.  Patient has plenty more of his Ativan prescription, do not feel he is acutely withdrawing today.  Appears to be no medical cause of patient's  symptoms, patient is rehydrated, and provided with sustenance.  Case management and social work involved in patient case, and patient is provided with resources for home health should he remain in an independent living situation, and Education officer, museum made contact with patient's family friend who is assisting him with group home placement.  Face-to-face consultation placed.  Patient was instructed to return for any new or worsening symptoms.  Patient is in understanding and agrees with the plan of care.  Final Clinical Impressions(s) / ED Diagnoses   Final diagnoses:  Generalized weakness  Acute stress reaction  Elevated blood pressure reading with diagnosis of hypertension    ED Discharge Orders    None       Tamala Julian 01/29/18 1401    Julianne Rice, MD 01/29/18 1458

## 2018-01-29 NOTE — ED Notes (Signed)
Bed: [facility]PT2 Expected date:  Expected time:  Means of arrival:  Comments: 

## 2018-01-29 NOTE — ED Triage Notes (Addendum)
Per EMS- patient c/o feeling tired and weak and decreased appetite. Patient reported that he is being kicked out of his apartment. EMS stated that the patient did not have any food in his refrigerator. Patient did say a case manager was trying to help him get a new place to live. Patient also c/o foul smelling urine.

## 2018-01-29 NOTE — Care Management Note (Signed)
Case Management Note  Patient Details  Name: Anthony Lamb MRN: [id number] Date of Birth: August 25, 1955  CM consulted for [facility]  CM spoke with pt at bedside and discussed [facility] agencies.  Pt choose [facility].  Contacted Santiago Glad with [facility] who accepted pt for services and is aware of pt's needs.  Veverly Fells, [place].  No further CM needs noted at this time.  Expected Discharge Date:   01/29/2018               Expected Discharge Plan:  [place]  In-House Referral:  Clinical Social Work  Discharge planning Services  CM Consult  Post Acute Care Choice:  Home Health Choice offered to:  Patient  HH Arranged:  Therapist, sports, Social Work [employer] Agency:  [employer]  Status of Service:  Completed, signed off  Adjoa Althouse, Benjaman Lobe, RN 01/29/2018, 1:08 PM

## 2018-02-05 ENCOUNTER — Encounter (HOSPITAL_COMMUNITY): Payer: Self-pay

## 2018-02-05 ENCOUNTER — Emergency Department (HOSPITAL_COMMUNITY): Payer: Medicaid Other

## 2018-02-05 ENCOUNTER — Emergency Department (HOSPITAL_COMMUNITY)
Admission: EM | Admit: 2018-02-05 | Discharge: 2018-02-05 | Disposition: A | Discharge status: Home / Self Care | Payer: Medicaid Other | Attending: Emergency Medicine | Admitting: Emergency Medicine

## 2018-02-05 ENCOUNTER — Other Ambulatory Visit: Payer: Self-pay

## 2018-02-05 DIAGNOSIS — S52121A Displaced fracture of head of right radius, initial encounter for closed fracture: Secondary | ICD-10-CM | POA: Insufficient documentation

## 2018-02-05 DIAGNOSIS — Z87891 Personal history of nicotine dependence: Secondary | ICD-10-CM | POA: Insufficient documentation

## 2018-02-05 DIAGNOSIS — I10 Essential (primary) hypertension: Secondary | ICD-10-CM | POA: Insufficient documentation

## 2018-02-05 DIAGNOSIS — S62109A Fracture of unspecified carpal bone, unspecified wrist, initial encounter for closed fracture: Secondary | ICD-10-CM

## 2018-02-05 DIAGNOSIS — Z79899 Other long term (current) drug therapy: Secondary | ICD-10-CM | POA: Diagnosis not present

## 2018-02-05 DIAGNOSIS — Y9301 Activity, walking, marching and hiking: Secondary | ICD-10-CM | POA: Diagnosis not present

## 2018-02-05 DIAGNOSIS — Y92008 Other place in unspecified non-institutional (private) residence as the place of occurrence of the external cause: Secondary | ICD-10-CM | POA: Diagnosis not present

## 2018-02-05 DIAGNOSIS — W010XXA Fall on same level from slipping, tripping and stumbling without subsequent striking against object, initial encounter: Secondary | ICD-10-CM | POA: Insufficient documentation

## 2018-02-05 DIAGNOSIS — R531 Weakness: Secondary | ICD-10-CM | POA: Diagnosis not present

## 2018-02-05 DIAGNOSIS — S62101A Fracture of unspecified carpal bone, right wrist, initial encounter for closed fracture: Secondary | ICD-10-CM

## 2018-02-05 DIAGNOSIS — Y999 Unspecified external cause status: Secondary | ICD-10-CM | POA: Diagnosis not present

## 2018-02-05 DIAGNOSIS — S6991XA Unspecified injury of right wrist, hand and finger(s), initial encounter: Secondary | ICD-10-CM | POA: Diagnosis present

## 2018-02-05 LAB — CBC WITH DIFFERENTIAL/PLATELET
Abs Immature Granulocytes: 0 10*3/uL (ref 0.0–0.1)
Basophils Absolute: 0.1 10*3/uL (ref 0.0–0.1)
Basophils Relative: 1 %
Eosinophils Absolute: 0 10*3/uL (ref 0.0–0.7)
Eosinophils Relative: 0 %
HCT: 45.6 % (ref 39.0–52.0)
Hemoglobin: 15.1 g/dL (ref 13.0–17.0)
Immature Granulocytes: 0 %
Lymphocytes Relative: 10 %
Lymphs Abs: 0.9 10*3/uL (ref 0.7–4.0)
MCH: 30.2 pg (ref 26.0–34.0)
MCHC: 33.1 g/dL (ref 30.0–36.0)
MCV: 91.2 fL (ref 78.0–100.0)
Monocytes Absolute: 0.7 10*3/uL (ref 0.1–1.0)
Monocytes Relative: 7 %
Neutro Abs: 7.4 10*3/uL (ref 1.7–7.7)
Neutrophils Relative %: 82 %
Platelets: 346 10*3/uL (ref 150–400)
RBC: 5 MIL/uL (ref 4.22–5.81)
RDW: 12.5 % (ref 11.5–15.5)
WBC: 9.2 10*3/uL (ref 4.0–10.5)

## 2018-02-05 LAB — COMPREHENSIVE METABOLIC PANEL
ALT: 18 U/L (ref 0–44)
AST: 27 U/L (ref 15–41)
Albumin: 3.8 g/dL (ref 3.5–5.0)
Alkaline Phosphatase: 83 U/L (ref 38–126)
Anion gap: 11 (ref 5–15)
BUN: <5 mg/dL — ABNORMAL LOW (ref 8–23)
CO2: 25 mmol/L (ref 22–32)
Calcium: 9 mg/dL (ref 8.9–10.3)
Chloride: 96 mmol/L — ABNORMAL LOW (ref 98–111)
Creatinine, Ser: 0.84 mg/dL (ref 0.61–1.24)
GFR calc Af Amer: >60 mL/min (ref >60)
GFR calc non Af Amer: >60 mL/min (ref >60)
Glucose, Bld: 117 mg/dL — ABNORMAL HIGH (ref 70–99)
Potassium: 4.1 mmol/L (ref 3.5–5.1)
Sodium: 132 mmol/L — ABNORMAL LOW (ref 135–145)
Total Bilirubin: 0.7 mg/dL (ref 0.3–1.2)
Total Protein: 7 g/dL (ref 6.5–8.1)

## 2018-02-05 MED ORDER — ACETAMINOPHEN 500 MG PO TABS: 1000.0000 mg | ORAL_TABLET | Freq: Three times a day (TID) | ORAL | 0 refills | Status: DC | PRN

## 2018-02-05 MED ORDER — FAMOTIDINE 20 MG PO TABS: 20.0000 mg | ORAL_TABLET | Freq: Two times a day (BID) | ORAL | 0 refills | Status: DC

## 2018-02-05 MED ORDER — ACETAMINOPHEN 500 MG PO TABS: 500.0000 mg | ORAL_TABLET | Freq: Four times a day (QID) | ORAL | 0 refills | Status: DC | PRN

## 2018-02-05 MED ORDER — NAPROXEN 500 MG PO TABS: 500.0000 mg | ORAL_TABLET | Freq: Two times a day (BID) | ORAL | 0 refills | Status: DC

## 2018-02-05 MED ORDER — OXYCODONE-ACETAMINOPHEN 5-325 MG PO TABS
1.0000 | ORAL_TABLET | Freq: Once | ORAL | Status: AC
  Administered 2018-02-05: 1 via ORAL
  Filled 2018-02-05: qty 1

## 2018-02-05 MED ORDER — SODIUM CHLORIDE 0.9 % IV BOLUS
1000.0000 mL | Freq: Once | INTRAVENOUS | Status: AC
  Administered 2018-02-05: 1000 mL via INTRAVENOUS

## 2018-02-05 MED ORDER — LIDOCAINE HCL 1 % IJ SOLN: 10.0000 mL | Freq: Once | INTRAMUSCULAR | Status: DC

## 2018-02-05 MED ORDER — LORAZEPAM 2 MG/ML IJ SOLN
1.0000 mg | Freq: Once | INTRAMUSCULAR | Status: AC
  Administered 2018-02-05: 1 mg via INTRAVENOUS
  Filled 2018-02-05: qty 1

## 2018-02-05 MED ORDER — HYDROMORPHONE HCL 1 MG/ML IJ SOLN
0.5000 mg | Freq: Once | INTRAMUSCULAR | Status: AC
  Administered 2018-02-05: 0.5 mg via INTRAVENOUS
  Filled 2018-02-05: qty 1

## 2018-02-05 MED ORDER — LIDOCAINE HCL (PF) 1 % IJ SOLN
10.0000 mL | Freq: Once | INTRAMUSCULAR | Status: AC
  Administered 2018-02-05: 10 mL
  Filled 2018-02-05: qty 10

## 2018-02-05 MED ORDER — MORPHINE SULFATE (PF) 4 MG/ML IV SOLN
4.0000 mg | Freq: Once | INTRAVENOUS | Status: AC
  Administered 2018-02-05: 4 mg via INTRAVENOUS
  Filled 2018-02-05: qty 1

## 2018-02-05 NOTE — Progress Notes (Signed)
Orthopedic Tech Progress Note Patient Details:  Anthony Lamb 03/12/1956 [id number]  Ortho Devices Type of Ortho Device: Ace wrap, Sling immobilizer, Sugartong splint Ortho Device/Splint Interventions: Application   Post Interventions Patient Tolerated: Well Instructions Provided: Care of device   [name] 02/05/2018, 11:44 AM

## 2018-02-05 NOTE — ED Triage Notes (Signed)
Patient has been having anxiety and weakness x 3 days. Called EMS and when Fire arrived had a witnessed trip and fall, injuring right wrist. Per EMS obvious deformity. 200 fentanyl given.

## 2018-02-05 NOTE — Progress Notes (Addendum)
2:21pm- CSW spoke with Charisse and was informed that she has sent information over to [facility] on pt. Charisse advised CSW that they WOULD NOT be able to take pt today from the ED but would follow up with pt once at home. CSW updated pt's friend Dyann Ruddle of this at this time. CSW provided pt with taxi back home. NO further CSW needs. CSW signing off.   1:59pm- CSW and PA reached out to [place] at pt's request. CSW and PA dicussed barriers to placing pt (not meeting criteria for admission or placement as pt is able to walk and do for self). CSW attempted to explain to [place] what services CSW could offer. CSW received call from [place] and was informed that she may have found placement for pt but requested that notes be sent to fax [phone]). CSW has faxed over notes at this time. CSW to wait for call back from [place]. CSW has also made APS aware of pt at this time.   CSW consulted for transportation issues. CSW spoke with pt at bedside to provided pt with SCAT application to see of this services would benefit pt. CSW was advised that per notes. This CSW gave pt the same resources on lats admission to the ED, however pt never followed up. CSW spoke with pt about needs at this time and CSW was advised that pt was suppose to be out of apartment as of 02/02/18 however was given til the end of this week to be out. CSW spoke with Elsie Amis who is a support for pt. Dyann Ruddle expressed concerns about pt being home alone and needing more support. CSW advised Dyann Ruddle that [place] would speak with [facility] [place] pt's [facility] [phone]. CSW spoke with [facility] and was informed that she has been looking for pt a group home but expressed that she hasnt heard anything just yet. CSW explained the situation to [facility] [place] and she expressed that if pt is discharged home then she would follow up with pt once home.   CSW has followed up with [facility] APS at this time to make  report for further assessment of pt's needs. CSW left voicemail for [facility] APS at this time. CSW has also reached out to [facility] and left voicemail for [place] asking that she call CSW back. CSW awaits return call at this time.   Virgie Dad Daphine Loch, MSW, [facility] Clinical Social Worker [phone]

## 2018-02-05 NOTE — Discharge Instructions (Addendum)
Take Tylenol every 8 hours as prescribed.  Keep your splint on, clean and dry, until your appointment with a hand doctor.  Dr. Biagio Borg office will call you to make an appointment for later this week.  Please return to emergency department if you develop any new or worsening symptoms or if your splint gets wet.  Services for placement will contact you at home this week.

## 2018-02-05 NOTE — ED Notes (Signed)
Patient states he could not sign the discharge paper.

## 2018-02-05 NOTE — ED Notes (Signed)
ED Provider at bedside. 

## 2018-02-05 NOTE — Procedures (Signed)
Procedure: Right wrist closed reduction  Indication: Right wrist fracture  Surgeon: Silvestre Gunner, PA-C  Assist: None  Anesthesia: 14m 1% plain Lidocaine as hematoma block  EBL: None  Complications: None  Findings: After risks/benefits explained patient desires to undergo procedure. The right wrist was sterilely prepped and injected with lidocaine. Anesthesia was adequate but despite aggressive attempts only a little improvement was effected. Pt tolerated procedure well.     MLisette Abu PA-C Orthopedic Surgery [phone]

## 2018-02-05 NOTE — Discharge Planning (Signed)
Pt perviously (01/29/18) active with [facility] for RN and SW services.  EDCM spoke with [facility] Rep to find pt was never seen.

## 2018-02-05 NOTE — Consult Note (Addendum)
Reason for Consult:Right wrist fx Referring Physician: Keithan Dileonardo is an 62 y.o. male.  HPI: Anthony Lamb had called EMS because he felt like he was dehydrated. When the FD got to his door he lost his balance on the way to answer it and fell onto his outstretched right hand. He had immediate pain. He was brought to the ED where x-rays showed a distal radius/ulna fx and hand surgery was consulted. He is RHD.  Past Medical History:  Diagnosis Date  . Alcohol abuse, in remission   . Anemia   . Ankylosing spondylitis ([place])   . Anxiety   . Chronic diarrhea   . Chronic pain   . Colitis   . Depression   . Esophagitis   . Gastritis   . GERD (gastroesophageal reflux disease)   . Hypertension   . IBS (irritable bowel syndrome)   . Poor dentition   . SIADH (syndrome of inappropriate ADH production) ([place])     Past Surgical History:  Procedure Laterality Date  . COLONOSCOPY W/ BIOPSIES    . ESOPHAGOGASTRODUODENOSCOPY    . HERNIA REPAIR Bilateral   . TONSILLECTOMY      Family History  Problem Relation Age of Onset  . Anxiety disorder Mother   . Congestive Heart Failure Mother   . Crohn's disease Mother   . Colon cancer Mother 70  . Arthritis Father   . High blood pressure Father     Social History:  reports that he has quit smoking. His smoking use included cigarettes. He has never used smokeless tobacco. He reports that he drinks alcohol. He reports that he does not use drugs.  Allergies:  Allergies  Allergen Reactions  . Diphenhydramine Hcl Other (See Comments)    Restlessness  . Flexeril [Cyclobenzaprine] Other (See Comments)    Restlessness  . Hctz [Hydrochlorothiazide] Other (See Comments)    Caused to lose sodium when taking with Lisinopril  . Nsaids Other (See Comments)    GI upset    Medications: I have reviewed the patient's current medications.  Results for orders placed or performed during the hospital encounter of 02/05/18 (from the past 48 hour(s))   CBC with Differential     Status: None   Collection Time: 02/05/18  8:53 AM  Result Value Ref Range   WBC 9.2 4.0 - 10.5 K/uL   RBC 5.00 4.22 - 5.81 MIL/uL   Hemoglobin 15.1 13.0 - 17.0 g/dL   HCT 45.6 39.0 - 52.0 %   MCV 91.2 78.0 - 100.0 fL   MCH 30.2 26.0 - 34.0 pg   MCHC 33.1 30.0 - 36.0 g/dL   RDW 12.5 11.5 - 15.5 %   Platelets 346 150 - 400 K/uL   Neutrophils Relative % 82 %   Neutro Abs 7.4 1.7 - 7.7 K/uL   Lymphocytes Relative 10 %   Lymphs Abs 0.9 0.7 - 4.0 K/uL   Monocytes Relative 7 %   Monocytes Absolute 0.7 0.1 - 1.0 K/uL   Eosinophils Relative 0 %   Eosinophils Absolute 0.0 0.0 - 0.7 K/uL   Basophils Relative 1 %   Basophils Absolute 0.1 0.0 - 0.1 K/uL   Immature Granulocytes 0 %   Abs Immature Granulocytes 0.0 0.0 - 0.1 K/uL    Comment: Performed at [facility], [address], [place], [place] [address]  Comprehensive metabolic panel     Status: Abnormal   Collection Time: 02/05/18  8:53 AM  Result Value Ref Range  Sodium 132 (L) 135 - 145 mmol/L   Potassium 4.1 3.5 - 5.1 mmol/L    Comment: HEMOLYSIS AT THIS LEVEL MAY AFFECT RESULT   Chloride 96 (L) 98 - 111 mmol/L   CO2 25 22 - 32 mmol/L   Glucose, Bld 117 (H) 70 - 99 mg/dL   BUN <5 (L) 8 - 23 mg/dL   Creatinine, Ser 0.84 0.61 - 1.24 mg/dL   Calcium 9.0 8.9 - 10.3 mg/dL   Total Protein 7.0 6.5 - 8.1 g/dL   Albumin 3.8 3.5 - 5.0 g/dL   AST 27 15 - 41 U/L   ALT 18 0 - 44 U/L   Alkaline Phosphatase 83 38 - 126 U/L   Total Bilirubin 0.7 0.3 - 1.2 mg/dL   GFR calc non Af Amer >60 >60 mL/min   GFR calc Af Amer >60 >60 mL/min    Comment: (NOTE) The eGFR has been calculated using the CKD EPI equation. This calculation has not been validated in all clinical situations. eGFR's persistently <60 mL/min signify possible Chronic Kidney Disease.    Anion gap 11 5 - 15    Comment: Performed at [facility] [address] [place], [place] [address]    Dg Wrist Complete Right  Result Date:  02/05/2018 CLINICAL DATA:  Fall.  Right wrist injury. EXAM: RIGHT WRIST - COMPLETE 3+ VIEW COMPARISON:  No recent prior. FINDINGS: Comminuted angulated fracture of the distal right radius is present. Fracture may extend into the radiocarpal joint space. Displaced fracture of the ulnar styloid noted. No other focal abnormality. IMPRESSION: Comminuted angulated fracture of the distal right radius. Fracture may extend into the radiocarpal joint space. Displaced fracture of the ulnar styloid. Electronically Signed   By: Marcello Moores  Register   On: 02/05/2018 09:41    Review of Systems  Constitutional: Negative for weight loss.  HENT: Negative for ear discharge, ear pain, hearing loss and tinnitus.   Eyes: Negative for blurred vision, double vision, photophobia and pain.  Respiratory: Negative for cough, sputum production and shortness of breath.   Cardiovascular: Negative for chest pain.  Gastrointestinal: Negative for abdominal pain, nausea and vomiting.  Genitourinary: Negative for dysuria, flank pain, frequency and urgency.  Musculoskeletal: Positive for joint pain (Right wrist). Negative for back pain, falls, myalgias and neck pain.  Neurological: Negative for dizziness, tingling, sensory change, focal weakness, loss of consciousness and headaches.  Endo/Heme/Allergies: Does not bruise/bleed easily.  Psychiatric/Behavioral: Negative for depression, memory loss and substance abuse. The patient is not nervous/anxious.    Blood pressure (!) 160/83, pulse 88, resp. rate 16, height 5' 11"  (1.803 m), weight 79.4 kg, SpO2 96 %. Physical Exam  Constitutional: He appears well-developed and well-nourished. No distress.  HENT:  Head: Normocephalic and atraumatic.  Eyes: Conjunctivae are normal. Right eye exhibits no discharge. Left eye exhibits no discharge. No scleral icterus.  Neck: Normal range of motion.  Cardiovascular: Normal rate and regular rhythm.  Respiratory: Effort normal. No respiratory distress.   Musculoskeletal:  Right shoulder, elbow, wrist, digits- no skin wounds, mod TTP wrist, edema + deformity, no instability, no blocks to motion  Sens  Ax/R/M/U intact  Mot   Ax/ R/ PIN/ M/ AIN/ U intact  Rad 2+  Neurological: He is alert.  Skin: Skin is warm and dry. He is not diaphoretic.  Psychiatric: He has a normal mood and affect. His behavior is normal.    Assessment/Plan: Right wrist fx -- NWB, splint. Should f/u with Dr. Grandville Silos in  the office this week (the office will call him for appointment).    Lisette Abu, PA-C Orthopedic Surgery [phone] 02/05/2018, 9:83 AM   62 year old male following mechanical fall onto an outstretched hand, with closed right comminuted displaced distal radius fracture.  NVI.  Provisionally reduced in the ER following hematoma block and sugar tong splint applied.  Will very likely nbenefit from delayed skeletal reconstruction to obtain and maintain a more anatomic alignment in order to optimize functional recovery.  We will discuss this further when he is reevaluated in the office, which we will arrange with him tomorrow.  Micheline Rough, MD Hand Surgery

## 2018-02-05 NOTE — ED Provider Notes (Signed)
[facility] EMERGENCY DEPARTMENT Provider Note   CSN: [id number] Arrival date & time: 02/05/18  0825     History   Chief Complaint Chief Complaint  Patient presents with  . Weakness  . Wrist Pain    HPI Anthony Lamb is a 62 y.o. male with history of anxiety, depression, GERD, hypertension, IBS who presents with generalized weakness and fatigue for the past few days as well as right wrist pain.  Patient has been under a lot of stress because he has an impending move coming up and has had a lot of problems regarding it.  He has not been eating because of his anxiety.  He called EMS for this complaint this morning and when EMS arrived, patient was walking across the room and tripped on something on the floor and fell on his outstretched right hand.  He now has a deformity to his right wrist.  He was given 200 mcg of fentanyl.  He denies any numbness or tingling.  He did not his head or lose consciousness.  He denies any new neck or back pain.  He also denies any chest pain, shortness of breath, abdominal pain, nausea, vomiting, urinary symptoms.  Patient takes Ativan 1 mg 3 times daily as needed for anxiety and has not taken anything yet today.  HPI  Past Medical History:  Diagnosis Date  . Alcohol abuse, in remission   . Anemia   . Ankylosing spondylitis ([place])   . Anxiety   . Chronic diarrhea   . Chronic pain   . Colitis   . Depression   . Esophagitis   . Gastritis   . GERD (gastroesophageal reflux disease)   . Hypertension   . IBS (irritable bowel syndrome)   . Poor dentition   . SIADH (syndrome of inappropriate ADH production) [facility])     Patient Active Problem List   Diagnosis Date Noted  . IBS (irritable bowel syndrome)   . Hypertension   . Colitis   . Alcohol abuse, in remission   . Adjustment disorder with mixed anxiety and depressed mood 05/11/2017  . Dehydration 12/13/2016  . Alcohol abuse   . SOB (shortness of breath)   . Chest pain  11/11/2016  . Anxiety   . Gastroesophageal reflux disease   . Osteopenia determined by x-ray 08/06/2014  . Stress fracture of calcaneus 08/06/2014  . Vitamin D deficiency 12/18/2013  . Dementia 12/18/2013  . Benign microscopic hematuria 07/06/2013  . SIADH (syndrome of inappropriate ADH production) ([place]) 06/22/2013  . Hyponatremia 06/20/2013  . Ankylosing spondylitis ([place]) 06/20/2013  . Malnutrition of moderate degree ([place]) 06/20/2013  . BPH (benign prostatic hyperplasia) 08/22/2010  . Backache 04/19/2010  . ALCOHOLISM 08/13/2008  . History of colonic polyps 08/13/2008  . Essential hypertension 07/03/2007  . PUD (peptic ulcer disease) 07/03/2007  . Irritable bowel syndrome 07/03/2007    Past Surgical History:  Procedure Laterality Date  . COLONOSCOPY W/ BIOPSIES    . ESOPHAGOGASTRODUODENOSCOPY    . HERNIA REPAIR Bilateral   . TONSILLECTOMY          Home Medications    Prior to Admission medications   Medication Sig Start Date End Date Taking? Authorizing Provider  lisinopril (PRINIVIL,ZESTRIL) 10 MG tablet Take 1 tablet (10 mg total) by mouth daily. Patient taking differently: Take 10 mg by mouth at bedtime.  08/21/17 02/05/18 Yes Lacroce, Hulen Shouts, MD  LORazepam (ATIVAN) 1 MG tablet Take 1 mg by mouth 3 (three) times daily as  needed for anxiety.  12/26/17  Yes [provider]  pantoprazole (PROTONIX) 40 MG tablet Take 1 tablet (40 mg total) by mouth daily. 04/24/14  Yes Janith Lima, MD  sodium chloride (OCEAN) 0.65 % SOLN nasal spray Place 1-2 sprays into both nostrils as needed for congestion.    Yes [provider]  sucralfate (CARAFATE) 1 G tablet Take 1 tablet (1 g total) by mouth 4 (four) times daily -  with meals and at bedtime. Chew before swallowing 12/17/13  Yes Janith Lima, MD  verapamil (CALAN-SR) 240 MG CR tablet Take 240 mg by mouth at bedtime. 02/09/17  Yes [provider]  zolpidem (AMBIEN) 10 MG tablet Take 1 tablet (10 mg  total) by mouth at bedtime as needed for sleep. Patient taking differently: Take 10 mg by mouth at bedtime.  05/13/17 02/05/18 Yes Khatri, Hina, PA-C  acetaminophen (TYLENOL) 500 MG tablet Take 1 tablet (500 mg total) by mouth every 6 (six) hours as needed. 02/05/18   Seydina Holliman, Bea Graff, PA-C  chlordiazePOXIDE (LIBRIUM) 25 MG capsule 47m PO TID x 1D, then 25-511mPO BID X 1D, then 25-5078mO QD X 1D Patient not taking: Reported on 01/29/2018 01/11/18   McMFrancine GravenO  diazepam (VALIUM) 5 MG tablet Take 1 tablet (5 mg total) by mouth 2 (two) times daily. Patient not taking: Reported on 10/19/2017 09/29/17   HavIsla PenceD  famotidine (PEPCID) 20 MG tablet Take 1 tablet (20 mg total) by mouth 2 (two) times daily. 02/05/18   Sissi Padia, AleBea GraffA-C  hydrOXYzine (ATARAX/VISTARIL) 25 MG tablet Take 1 tablet (25 mg total) by mouth every 6 (six) hours as needed for anxiety. Patient not taking: Reported on 10/01/2017 06/11/17   Robinson, JorMartinique PA-C  naproxen (NAPROSYN) 500 MG tablet Take 1 tablet (500 mg total) by mouth 2 (two) times daily. 02/05/18   LawFrederica KusterA-C  ondansetron (ZOFRAN ODT) 4 MG disintegrating tablet 4mg23mT q4 hours prn nausea/vomit Patient not taking: Reported on 10/01/2017 09/19/17   FloyDeno Etienne    Family History Family History  Problem Relation Age of Onset  . Anxiety disorder Mother   . Congestive Heart Failure Mother   . Crohn's disease Mother   . Colon cancer Mother 60  102Arthritis Father   . High blood pressure Father     Social History Social History   Tobacco Use  . Smoking status: Former Smoker    Types: Cigarettes  . Smokeless tobacco: Never Used  Substance Use Topics  . Alcohol use: Yes    Comment: daily/ 3-4 beers  . Drug use: No     Allergies   Diphenhydramine hcl; Flexeril [cyclobenzaprine]; Hctz [hydrochlorothiazide]; and Nsaids   Review of Systems Review of Systems  Constitutional: Positive for fatigue. Negative for chills and fever.    HENT: Negative for facial swelling and sore throat.   Respiratory: Negative for shortness of breath.   Cardiovascular: Negative for chest pain.  Gastrointestinal: Negative for abdominal pain, nausea and vomiting.  Genitourinary: Negative for dysuria.  Musculoskeletal: Positive for arthralgias and joint swelling. Negative for back pain and neck pain.  Skin: Negative for rash and wound.  Neurological: Positive for weakness. Negative for headaches.  Psychiatric/Behavioral: The patient is not nervous/anxious.      Physical Exam Updated Vital Signs BP (!) 172/98   Pulse 88   Resp 16   Ht 5' 11"  (1.803 m)   Wt 79.4 kg   SpO2 97%  BMI 24.41 kg/m   Physical Exam  Constitutional: He appears well-developed and well-nourished. No distress.  HENT:  Head: Normocephalic and atraumatic.  Mouth/Throat: Oropharynx is clear and moist. No oropharyngeal exudate.  Eyes: Pupils are equal, round, and reactive to light. Conjunctivae are normal. Right eye exhibits no discharge. Left eye exhibits no discharge. No scleral icterus.  Neck: Normal range of motion. Neck supple. No thyromegaly present.  Cardiovascular: Normal rate, regular rhythm, normal heart sounds and intact distal pulses. Exam reveals no gallop and no friction rub.  No murmur heard. Pulmonary/Chest: Effort normal and breath sounds normal. No stridor. No respiratory distress. He has no wheezes. He has no rales.  Abdominal: Soft. Bowel sounds are normal. He exhibits no distension. There is no tenderness. There is no rebound and no guarding.  Musculoskeletal: He exhibits no edema.  Edema and swelling to right wrist; tenderness to the anatomical snuffbox and radial aspect of the distal forearm; patient has full range of motion of all digits with sensation intact, radial pulses intact No tenderness on palpation of the right elbow or shoulder No midline cervical, thoracic, or lumbar tenderness No tenderness to bilateral hips or knees   Lymphadenopathy:    He has no cervical adenopathy.  Neurological: He is alert. Coordination normal.  Skin: Skin is warm and dry. No rash noted. He is not diaphoretic. No pallor.  Psychiatric: He has a normal mood and affect.  Nursing note and vitals reviewed.    ED Treatments / Results  Labs (all labs ordered are listed, but only abnormal results are displayed) Labs Reviewed  COMPREHENSIVE METABOLIC PANEL - Abnormal; Notable for the following components:      Result Value   Sodium 132 (*)    Chloride 96 (*)    Glucose, Bld 117 (*)    BUN <5 (*)    All other components within normal limits  CBC WITH DIFFERENTIAL/PLATELET    EKG EKG Interpretation  Date/Time:  Tuesday February 05 2018 08:32:05 EDT Ventricular Rate:  86 PR Interval:    QRS Duration: 112 QT Interval:  411 QTC Calculation: 492 R Axis:   53 Text Interpretation:  Sinus rhythm Borderline intraventricular conduction delay Confirmed by Virgel Manifold [phone]) on 02/05/2018 9:05:37 AM   Radiology Dg Wrist 2 Views Right  Result Date: 02/05/2018 CLINICAL DATA:  Post reduction right wrist fracture EXAM: RIGHT WRIST - 2 VIEW COMPARISON:  Right wrist films of 02/05/2009 FINDINGS: There is very little change in the slightly angulated transverse comminuted fracture of the distal right radius. Fracture of the ulnar styloid is noted as well. Deep bony detail is somewhat obscured by overlying cast material IMPRESSION: Little change in position of the transverse comminuted slightly angulated fracture of the distal right radius. No change in ulnar styloid fracture. Electronically Signed   By: Ivar Drape M.D.   On: 02/05/2018 11:50   Dg Wrist Complete Right  Result Date: 02/05/2018 CLINICAL DATA:  Fall.  Right wrist injury. EXAM: RIGHT WRIST - COMPLETE 3+ VIEW COMPARISON:  No recent prior. FINDINGS: Comminuted angulated fracture of the distal right radius is present. Fracture may extend into the radiocarpal joint space. Displaced  fracture of the ulnar styloid noted. No other focal abnormality. IMPRESSION: Comminuted angulated fracture of the distal right radius. Fracture may extend into the radiocarpal joint space. Displaced fracture of the ulnar styloid. Electronically Signed   By: Marcello Moores  Register   On: 02/05/2018 09:41    Procedures Procedures (including critical care time)  Medications Ordered  in ED Medications  LORazepam (ATIVAN) injection 1 mg (1 mg Intravenous Given 02/05/18 0857)  morphine 4 MG/ML injection 4 mg (4 mg Intravenous Given 02/05/18 0857)  HYDROmorphone (DILAUDID) injection 0.5 mg (0.5 mg Intravenous Given 02/05/18 1040)  lidocaine (PF) (XYLOCAINE) 1 % injection 10 mL (10 mLs Other Given 02/05/18 1125)  sodium chloride 0.9 % bolus 1,000 mL (1,000 mLs Intravenous New Bag/Given 02/05/18 1218)     Initial Impression / Assessment and Plan / ED Course  I have reviewed the triage vital signs and the nursing notes.  Pertinent labs & imaging results that were available during my care of the patient were reviewed by me and considered in my medical decision making (see chart for details).     Patient presenting with weakness, anxiety and distal radial ulnar fracture in the right wrist.  Patient called EMS and tripped and fell in front of them and fell on outstretched right arm.  Labs are baseline today.  Ortho PA, Hilbert Odor, evaluated the patient, reduced and splinted the wrist.  Dr. Grandville Silos, hand surgery, will follow-up in his office will call to make an appointment for further management, possibly surgery.  Patient and patient's friend/advocate is concerned about patient's home situation.  Especially with his new injury, patient has difficulty caring for himself.  He lives alone.  Patient does not meet criteria for hospital admission at this time, but social work is attempting assistance/placement for the patient.  I appreciate all involved their assistance.  Patient will be discharged home with Naprosyn and  Tylenol for pain control, as patient has history of alcohol and benzo abuse.  Patient understands and agrees with plan.  Patient vitals stable throughout ED course.  Final Clinical Impressions(s) / ED Diagnoses   Final diagnoses:  Closed fracture of right wrist, initial encounter    ED Discharge Orders         Ordered    naproxen (NAPROSYN) 500 MG tablet  2 times daily     02/05/18 1401    famotidine (PEPCID) 20 MG tablet  2 times daily     02/05/18 1401    acetaminophen (TYLENOL) 500 MG tablet  Every 6 hours PRN     02/05/18 [address] [place], PA-C 02/05/18 1412    Virgel Manifold, MD 02/14/18 1601

## 2018-02-07 ENCOUNTER — Other Ambulatory Visit: Payer: Self-pay | Admitting: Orthopedic Surgery

## 2018-02-07 ENCOUNTER — Other Ambulatory Visit: Payer: Self-pay

## 2018-02-07 ENCOUNTER — Encounter (HOSPITAL_BASED_OUTPATIENT_CLINIC_OR_DEPARTMENT_OTHER): Payer: Self-pay

## 2018-02-08 ENCOUNTER — Other Ambulatory Visit: Payer: Self-pay

## 2018-02-08 ENCOUNTER — Encounter (HOSPITAL_COMMUNITY)
Admission: RE | Disposition: A | Discharge status: Home / Self Care | Payer: Self-pay | Source: Ambulatory Visit | Attending: Family Medicine

## 2018-02-08 ENCOUNTER — Inpatient Hospital Stay (HOSPITAL_BASED_OUTPATIENT_CLINIC_OR_DEPARTMENT_OTHER)
Admission: RE | Admit: 2018-02-08 | Discharge: 2018-02-12 | DRG: 982 | Disposition: A | Discharge status: Home / Self Care | Payer: Medicaid Other | Source: Ambulatory Visit | Attending: Family Medicine | Admitting: Family Medicine

## 2018-02-08 ENCOUNTER — Encounter (HOSPITAL_COMMUNITY): Payer: Self-pay | Admitting: Anesthesiology

## 2018-02-08 ENCOUNTER — Encounter (HOSPITAL_BASED_OUTPATIENT_CLINIC_OR_DEPARTMENT_OTHER): Payer: Self-pay | Admitting: Emergency Medicine

## 2018-02-08 DIAGNOSIS — F10239 Alcohol dependence with withdrawal, unspecified: Secondary | ICD-10-CM

## 2018-02-08 DIAGNOSIS — I10 Essential (primary) hypertension: Secondary | ICD-10-CM

## 2018-02-08 DIAGNOSIS — K589 Irritable bowel syndrome without diarrhea: Secondary | ICD-10-CM | POA: Diagnosis present

## 2018-02-08 DIAGNOSIS — F1010 Alcohol abuse, uncomplicated: Secondary | ICD-10-CM | POA: Diagnosis not present

## 2018-02-08 DIAGNOSIS — E860 Dehydration: Secondary | ICD-10-CM | POA: Diagnosis present

## 2018-02-08 DIAGNOSIS — F419 Anxiety disorder, unspecified: Secondary | ICD-10-CM | POA: Diagnosis not present

## 2018-02-08 DIAGNOSIS — F13231 Sedative, hypnotic or anxiolytic dependence with withdrawal delirium: Secondary | ICD-10-CM | POA: Diagnosis not present

## 2018-02-08 DIAGNOSIS — K219 Gastro-esophageal reflux disease without esophagitis: Secondary | ICD-10-CM | POA: Diagnosis present

## 2018-02-08 DIAGNOSIS — F0390 Unspecified dementia without behavioral disturbance: Secondary | ICD-10-CM | POA: Diagnosis present

## 2018-02-08 DIAGNOSIS — W19XXXA Unspecified fall, initial encounter: Secondary | ICD-10-CM | POA: Diagnosis present

## 2018-02-08 DIAGNOSIS — E785 Hyperlipidemia, unspecified: Secondary | ICD-10-CM | POA: Diagnosis present

## 2018-02-08 DIAGNOSIS — S52551A Other extraarticular fracture of lower end of right radius, initial encounter for closed fracture: Secondary | ICD-10-CM | POA: Diagnosis present

## 2018-02-08 DIAGNOSIS — Z800 Family history of malignant neoplasm of digestive organs: Secondary | ICD-10-CM | POA: Diagnosis not present

## 2018-02-08 DIAGNOSIS — Z8249 Family history of ischemic heart disease and other diseases of the circulatory system: Secondary | ICD-10-CM | POA: Diagnosis not present

## 2018-02-08 DIAGNOSIS — F1320 Sedative, hypnotic or anxiolytic dependence, uncomplicated: Secondary | ICD-10-CM | POA: Diagnosis not present

## 2018-02-08 DIAGNOSIS — F10939 Alcohol use, unspecified with withdrawal, unspecified: Secondary | ICD-10-CM

## 2018-02-08 DIAGNOSIS — Z87891 Personal history of nicotine dependence: Secondary | ICD-10-CM | POA: Diagnosis not present

## 2018-02-08 DIAGNOSIS — Z8711 Personal history of peptic ulcer disease: Secondary | ICD-10-CM

## 2018-02-08 DIAGNOSIS — F10232 Alcohol dependence with withdrawal with perceptual disturbance: Secondary | ICD-10-CM | POA: Diagnosis present

## 2018-02-08 DIAGNOSIS — F13239 Sedative, hypnotic or anxiolytic dependence with withdrawal, unspecified: Secondary | ICD-10-CM | POA: Diagnosis present

## 2018-02-08 DIAGNOSIS — F13230 Sedative, hypnotic or anxiolytic dependence with withdrawal, uncomplicated: Secondary | ICD-10-CM | POA: Diagnosis not present

## 2018-02-08 DIAGNOSIS — F4323 Adjustment disorder with mixed anxiety and depressed mood: Secondary | ICD-10-CM

## 2018-02-08 DIAGNOSIS — Z888 Allergy status to other drugs, medicaments and biological substances status: Secondary | ICD-10-CM

## 2018-02-08 DIAGNOSIS — K08109 Complete loss of teeth, unspecified cause, unspecified class: Secondary | ICD-10-CM | POA: Diagnosis present

## 2018-02-08 DIAGNOSIS — F10932 Alcohol use, unspecified with withdrawal with perceptual disturbance: Secondary | ICD-10-CM

## 2018-02-08 DIAGNOSIS — Z818 Family history of other mental and behavioral disorders: Secondary | ICD-10-CM

## 2018-02-08 DIAGNOSIS — Z23 Encounter for immunization: Secondary | ICD-10-CM | POA: Diagnosis not present

## 2018-02-08 DIAGNOSIS — Z79899 Other long term (current) drug therapy: Secondary | ICD-10-CM

## 2018-02-08 DIAGNOSIS — E222 Syndrome of inappropriate secretion of antidiuretic hormone: Secondary | ICD-10-CM | POA: Diagnosis present

## 2018-02-08 DIAGNOSIS — Z419 Encounter for procedure for purposes other than remedying health state, unspecified: Secondary | ICD-10-CM

## 2018-02-08 DIAGNOSIS — F10230 Alcohol dependence with withdrawal, uncomplicated: Secondary | ICD-10-CM | POA: Diagnosis not present

## 2018-02-08 DIAGNOSIS — F10231 Alcohol dependence with withdrawal delirium: Secondary | ICD-10-CM | POA: Diagnosis present

## 2018-02-08 DIAGNOSIS — F1310 Sedative, hypnotic or anxiolytic abuse, uncomplicated: Secondary | ICD-10-CM | POA: Diagnosis not present

## 2018-02-08 LAB — CBC
HCT: 41.4 % (ref 39.0–52.0)
HEMOGLOBIN: 13.9 g/dL (ref 13.0–17.0)
MCH: 30.2 pg (ref 26.0–34.0)
MCHC: 33.6 g/dL (ref 30.0–36.0)
MCV: 89.8 fL (ref 78.0–100.0)
PLATELETS: 312 10*3/uL (ref 150–400)
RBC: 4.61 MIL/uL (ref 4.22–5.81)
RDW: 12.6 % (ref 11.5–15.5)
WBC: 11.2 10*3/uL — ABNORMAL HIGH (ref 4.0–10.5)

## 2018-02-08 LAB — COMPREHENSIVE METABOLIC PANEL
ALK PHOS: 72 U/L (ref 38–126)
ALT: 17 U/L (ref 0–44)
AST: 21 U/L (ref 15–41)
Albumin: 3.6 g/dL (ref 3.5–5.0)
Anion gap: 11 (ref 5–15)
BUN: <5 mg/dL — ABNORMAL LOW (ref 8–23)
CALCIUM: 9.1 mg/dL (ref 8.9–10.3)
CO2: 25 mmol/L (ref 22–32)
CREATININE: 0.72 mg/dL (ref 0.61–1.24)
Chloride: 97 mmol/L — ABNORMAL LOW (ref 98–111)
GFR calc non Af Amer: >60 mL/min (ref >60)
Glucose, Bld: 105 mg/dL — ABNORMAL HIGH (ref 70–99)
Potassium: 3.7 mmol/L (ref 3.5–5.1)
Sodium: 133 mmol/L — ABNORMAL LOW (ref 135–145)
Total Bilirubin: 0.9 mg/dL (ref 0.3–1.2)
Total Protein: 6.7 g/dL (ref 6.5–8.1)

## 2018-02-08 LAB — RAPID URINE DRUG SCREEN, HOSP PERFORMED
Amphetamines: NOT DETECTED
BENZODIAZEPINES: POSITIVE — AB
Barbiturates: NOT DETECTED
COCAINE: NOT DETECTED
OPIATES: NOT DETECTED
Tetrahydrocannabinol: NOT DETECTED

## 2018-02-08 LAB — PROTIME-INR
INR: 1.06
Prothrombin Time: 13.8 seconds (ref 11.4–15.2)

## 2018-02-08 LAB — ETHANOL

## 2018-02-08 LAB — AMMONIA: Ammonia: 65 umol/L — ABNORMAL HIGH (ref 9–35)

## 2018-02-08 SURGERY — OPEN REDUCTION INTERNAL FIXATION (ORIF) DISTAL RADIUS FRACTURE
Anesthesia: General | Laterality: Right

## 2018-02-08 MED ORDER — LACTATED RINGERS IV SOLN: INTRAVENOUS | Status: DC

## 2018-02-08 MED ORDER — ACETAMINOPHEN 650 MG RE SUPP: 650.0000 mg | Freq: Four times a day (QID) | RECTAL | Status: DC | PRN

## 2018-02-08 MED ORDER — VITAMIN B-1 100 MG PO TABS
100.0000 mg | ORAL_TABLET | Freq: Every day | ORAL | Status: DC
  Administered 2018-02-09 – 2018-02-12 (×4): 100 mg via ORAL
  Filled 2018-02-08 (×5): qty 1

## 2018-02-08 MED ORDER — LORAZEPAM 2 MG/ML IJ SOLN
1.0000 mg | Freq: Four times a day (QID) | INTRAMUSCULAR | Status: DC | PRN
  Administered 2018-02-09 – 2018-02-10 (×4): 1 mg via INTRAVENOUS
  Filled 2018-02-08 (×4): qty 1

## 2018-02-08 MED ORDER — FENTANYL CITRATE (PF) 100 MCG/2ML IJ SOLN: 50.0000 ug | INTRAMUSCULAR | Status: DC | PRN

## 2018-02-08 MED ORDER — MIDAZOLAM HCL 2 MG/2ML IJ SOLN: 1.0000 mg | INTRAMUSCULAR | Status: DC | PRN

## 2018-02-08 MED ORDER — LORAZEPAM 2 MG/ML IJ SOLN
0.0000 mg | Freq: Four times a day (QID) | INTRAMUSCULAR | Status: DC
  Administered 2018-02-08: 2 mg via INTRAVENOUS
  Filled 2018-02-08: qty 1

## 2018-02-08 MED ORDER — VITAMIN B-1 100 MG PO TABS: 100.0000 mg | ORAL_TABLET | Freq: Every day | ORAL | Status: DC

## 2018-02-08 MED ORDER — SODIUM CHLORIDE 0.9 % IV BOLUS
1000.0000 mL | Freq: Once | INTRAVENOUS | Status: AC
  Administered 2018-02-08: 1000 mL via INTRAVENOUS

## 2018-02-08 MED ORDER — THIAMINE HCL 100 MG/ML IJ SOLN
100.0000 mg | Freq: Every day | INTRAMUSCULAR | Status: DC
  Administered 2018-02-08: 100 mg via INTRAVENOUS
  Filled 2018-02-08: qty 2

## 2018-02-08 MED ORDER — ADULT MULTIVITAMIN W/MINERALS CH
1.0000 | ORAL_TABLET | Freq: Every day | ORAL | Status: DC
  Administered 2018-02-09 – 2018-02-12 (×4): 1 via ORAL
  Filled 2018-02-08 (×5): qty 1

## 2018-02-08 MED ORDER — SODIUM CHLORIDE 0.9 % IV SOLN
INTRAVENOUS | Status: DC
  Administered 2018-02-08: 20:00:00 via INTRAVENOUS
  Administered 2018-02-09: 1000 mL via INTRAVENOUS

## 2018-02-08 MED ORDER — ALBUTEROL SULFATE (2.5 MG/3ML) 0.083% IN NEBU: 2.5000 mg | INHALATION_SOLUTION | RESPIRATORY_TRACT | Status: DC | PRN

## 2018-02-08 MED ORDER — ENSURE ENLIVE PO LIQD
237.0000 mL | Freq: Two times a day (BID) | ORAL | Status: DC
  Administered 2018-02-09 (×2): 237 mL via ORAL

## 2018-02-08 MED ORDER — KETOROLAC TROMETHAMINE 30 MG/ML IJ SOLN
30.0000 mg | Freq: Once | INTRAMUSCULAR | Status: AC
  Administered 2018-02-08: 30 mg via INTRAVENOUS
  Filled 2018-02-08: qty 1

## 2018-02-08 MED ORDER — LORAZEPAM 1 MG PO TABS
0.0000 mg | ORAL_TABLET | Freq: Two times a day (BID) | ORAL | Status: DC
  Administered 2018-02-10 – 2018-02-11 (×2): 2 mg via ORAL
  Filled 2018-02-08: qty 1
  Filled 2018-02-08 (×2): qty 2

## 2018-02-08 MED ORDER — OXYCODONE HCL 5 MG PO TABS
5.0000 mg | ORAL_TABLET | Freq: Four times a day (QID) | ORAL | Status: DC | PRN
  Administered 2018-02-08 – 2018-02-12 (×15): 5 mg via ORAL
  Filled 2018-02-08 (×16): qty 1

## 2018-02-08 MED ORDER — INFLUENZA VAC SPLIT QUAD 0.5 ML IM SUSY
0.5000 mL | PREFILLED_SYRINGE | INTRAMUSCULAR | Status: AC
  Administered 2018-02-09: 0.5 mL via INTRAMUSCULAR
  Filled 2018-02-08: qty 0.5

## 2018-02-08 MED ORDER — HYDROCORTISONE 0.5 % EX CREA
TOPICAL_CREAM | Freq: Three times a day (TID) | CUTANEOUS | Status: DC
  Filled 2018-02-08: qty 28.35

## 2018-02-08 MED ORDER — ACETAMINOPHEN 325 MG PO TABS
650.0000 mg | ORAL_TABLET | Freq: Four times a day (QID) | ORAL | Status: DC | PRN
  Administered 2018-02-12: 650 mg via ORAL
  Filled 2018-02-08: qty 2

## 2018-02-08 MED ORDER — LORAZEPAM 1 MG PO TABS
1.0000 mg | ORAL_TABLET | Freq: Four times a day (QID) | ORAL | Status: AC | PRN
  Administered 2018-02-09 – 2018-02-11 (×3): 1 mg via ORAL
  Filled 2018-02-08 (×3): qty 1

## 2018-02-08 MED ORDER — SCOPOLAMINE 1 MG/3DAYS TD PT72: 1.0000 | MEDICATED_PATCH | Freq: Once | TRANSDERMAL | Status: DC | PRN

## 2018-02-08 MED ORDER — LORAZEPAM 1 MG PO TABS: 0.0000 mg | ORAL_TABLET | Freq: Two times a day (BID) | ORAL | Status: DC

## 2018-02-08 MED ORDER — CEFAZOLIN SODIUM-DEXTROSE 2-4 GM/100ML-% IV SOLN: 2.0000 g | INTRAVENOUS | Status: DC

## 2018-02-08 MED ORDER — VERAPAMIL HCL ER 240 MG PO TBCR
240.0000 mg | EXTENDED_RELEASE_TABLET | Freq: Every day | ORAL | Status: DC
  Administered 2018-02-08 – 2018-02-11 (×3): 240 mg via ORAL
  Filled 2018-02-08 (×4): qty 1

## 2018-02-08 MED ORDER — CEFAZOLIN SODIUM-DEXTROSE 2-4 GM/100ML-% IV SOLN
INTRAVENOUS | Status: AC
  Filled 2018-02-08: qty 100

## 2018-02-08 MED ORDER — FENTANYL CITRATE (PF) 100 MCG/2ML IJ SOLN
INTRAMUSCULAR | Status: AC
  Filled 2018-02-08: qty 2

## 2018-02-08 MED ORDER — MIDAZOLAM HCL 2 MG/2ML IJ SOLN
INTRAMUSCULAR | Status: AC
  Filled 2018-02-08: qty 2

## 2018-02-08 MED ORDER — THIAMINE HCL 100 MG/ML IJ SOLN
100.0000 mg | Freq: Every day | INTRAMUSCULAR | Status: DC
  Filled 2018-02-08 (×3): qty 2

## 2018-02-08 MED ORDER — LORAZEPAM 1 MG PO TABS
0.0000 mg | ORAL_TABLET | Freq: Four times a day (QID) | ORAL | Status: AC
  Administered 2018-02-09 – 2018-02-10 (×2): 2 mg via ORAL
  Administered 2018-02-10: 1 mg via ORAL
  Filled 2018-02-08: qty 2
  Filled 2018-02-08: qty 1
  Filled 2018-02-08: qty 2

## 2018-02-08 MED ORDER — PANTOPRAZOLE SODIUM 40 MG PO TBEC
40.0000 mg | DELAYED_RELEASE_TABLET | Freq: Every day | ORAL | Status: DC
  Administered 2018-02-09 – 2018-02-12 (×4): 40 mg via ORAL
  Filled 2018-02-08 (×4): qty 1

## 2018-02-08 MED ORDER — LISINOPRIL 10 MG PO TABS
10.0000 mg | ORAL_TABLET | Freq: Every day | ORAL | Status: DC
  Administered 2018-02-08 – 2018-02-11 (×3): 10 mg via ORAL
  Filled 2018-02-08 (×3): qty 1

## 2018-02-08 MED ORDER — LORAZEPAM 1 MG PO TABS
0.0000 mg | ORAL_TABLET | Freq: Four times a day (QID) | ORAL | Status: DC
  Administered 2018-02-08: 2 mg via ORAL
  Filled 2018-02-08: qty 2

## 2018-02-08 MED ORDER — SODIUM CHLORIDE 0.9% FLUSH
3.0000 mL | Freq: Two times a day (BID) | INTRAVENOUS | Status: DC
  Administered 2018-02-09 – 2018-02-12 (×7): 3 mL via INTRAVENOUS

## 2018-02-08 MED ORDER — LORAZEPAM 2 MG/ML IJ SOLN: 0.0000 mg | Freq: Two times a day (BID) | INTRAMUSCULAR | Status: DC

## 2018-02-08 MED ORDER — FOLIC ACID 1 MG PO TABS
1.0000 mg | ORAL_TABLET | Freq: Every day | ORAL | Status: DC
  Administered 2018-02-09 – 2018-02-12 (×4): 1 mg via ORAL
  Filled 2018-02-08 (×5): qty 1

## 2018-02-08 MED ORDER — THIAMINE HCL 100 MG/ML IJ SOLN
Freq: Once | INTRAVENOUS | Status: AC
  Administered 2018-02-08: 21:00:00 via INTRAVENOUS
  Filled 2018-02-08: qty 1000

## 2018-02-08 MED ORDER — ZOLPIDEM TARTRATE 5 MG PO TABS
5.0000 mg | ORAL_TABLET | Freq: Every evening | ORAL | Status: DC | PRN
  Administered 2018-02-08 – 2018-02-11 (×4): 5 mg via ORAL
  Filled 2018-02-08 (×4): qty 1

## 2018-02-08 MED ORDER — SUCRALFATE 1 G PO TABS
1.0000 g | ORAL_TABLET | Freq: Three times a day (TID) | ORAL | Status: DC
  Administered 2018-02-08 – 2018-02-12 (×11): 1 g via ORAL
  Filled 2018-02-08 (×11): qty 1

## 2018-02-08 NOTE — H&P (Signed)
History and Physical    Anthony Lamb DSK:[id number] DOB: [birth year] DOA: 02/08/2018  PCP: Sandi Mariscal, MD   I have briefly reviewed patients previous medical reports in [facility].  Patient coming from: Sent to [facility] ED from preop.  Chief Complaint: "Alcohol withdrawal"  HPI: Anthony Lamb is a 62 year old male, lives in a rental apartment and apparently soon to be evicted, PMH of ongoing alcohol abuse, anxiety disorder on chronic benzodiazepines prescribed by PCP, chronic diarrhea, reported ulcerative colitis and supposed to see a physician at [facility], chronic pain, depression, PUD, HTN, SIADH, sustained a fall and fracture of right wrist approximately a week ago and was supposed to have it operated today but when he showed up for surgery, he had symptoms concerning for alcohol and benzodiazepine withdrawal, surgery was canceled and patient was referred to ED for admission.  Patient reports drinking 3 to 4 cans of beer daily, denies hard liquor or drinking more on weekends.  He abruptly stopped drinking 48 hours prior to admission.  He also reports taking Ativan 1 mg 3 times daily and ran out of this 48 hours prior to admission.  Since then patient states that he has been doing poorly, increased anxiety, tremulousness, twitchings, body aches, nausea, unchanged chronic intermittent diarrhea.  To EDP he also reported visual changes "walls turning green" and heard his mother's voice screaming out last night.  He reports inability to sleep for the last 2 days and asks for Ambien.  He reportedly has similar withdrawal symptoms in the past but has not been to rehab.  No seizures reported.  He is asking for pain medications for his right wrist, not relieved by IV Toradol.  He is asking for Ambien for inability to sleep.  He also requests Librium taper for a couple days at discharge.  Denies suicidal or homicidal ideations.  Ongoing moderate pain of his right wrist.  Denies any other  complaints.   ED Course: Noted to have anxiety, tremulousness, initial CIWA 15 with associated hypertension, tachypnea.  Initiated CIWA protocol.  Patient states that symptoms had improved but are recurring now.  Lab work significant for sodium 133 but stable compared to 3 days ago and otherwise largely unremarkable.  Review of Systems:  All other systems reviewed and apart from HPI, are negative.  Past Medical History:  Diagnosis Date  . Alcohol abuse, in remission   . Anemia   . Ankylosing spondylitis ([place])   . Anxiety   . Chronic diarrhea   . Chronic pain   . Colitis   . Depression   . Esophagitis   . Gastritis   . GERD (gastroesophageal reflux disease)   . Hypertension   . IBS (irritable bowel syndrome)   . Poor dentition   . SIADH (syndrome of inappropriate ADH production) ([place])     Past Surgical History:  Procedure Laterality Date  . COLONOSCOPY W/ BIOPSIES    . ESOPHAGOGASTRODUODENOSCOPY    . HERNIA REPAIR Bilateral   . TONSILLECTOMY      Social History  reports that he quit smoking about 5 years ago. His smoking use included cigarettes. He has never used smokeless tobacco. He reports that he drinks alcohol. He reports that he does not use drugs.  Allergies  Allergen Reactions  . Diphenhydramine Hcl Other (See Comments)    Restlessness  . Flexeril [Cyclobenzaprine] Other (See Comments)    Restlessness  . Hctz [Hydrochlorothiazide] Other (See Comments)    Caused to lose sodium when  taking with Lisinopril  . Nsaids Other (See Comments)    GI upset    Family History  Problem Relation Age of Onset  . Anxiety disorder Mother   . Congestive Heart Failure Mother   . Crohn's disease Mother   . Colon cancer Mother 14  . Arthritis Father   . High blood pressure Father      Prior to Admission medications   Medication Sig Start Date End Date Taking? Authorizing Provider  acetaminophen (TYLENOL) 500 MG tablet Take 2 tablets (1,000 mg total) by mouth every 8  (eight) hours as needed. 02/05/18  Yes Law, Bea Graff, PA-C  hydrOXYzine (ATARAX/VISTARIL) 25 MG tablet Take 1 tablet (25 mg total) by mouth every 6 (six) hours as needed for anxiety. 06/11/17  Yes Quentin Cornwall, [place] N, PA-C  lisinopril (PRINIVIL,ZESTRIL) 10 MG tablet Take 1 tablet (10 mg total) by mouth daily. Patient taking differently: Take 10 mg by mouth at bedtime.  08/21/17 02/07/18 Yes Lacroce, Hulen Shouts, MD  pantoprazole (PROTONIX) 40 MG tablet Take 1 tablet (40 mg total) by mouth daily. 04/24/14  Yes Janith Lima, MD  sodium chloride (OCEAN) 0.65 % SOLN nasal spray Place 1-2 sprays into both nostrils as needed for congestion.    Yes [provider]  sucralfate (CARAFATE) 1 G tablet Take 1 tablet (1 g total) by mouth 4 (four) times daily -  with meals and at bedtime. Chew before swallowing 12/17/13  Yes Janith Lima, MD  verapamil (CALAN-SR) 240 MG CR tablet Take 240 mg by mouth at bedtime. 02/09/17  Yes [provider]    Physical Exam: Vitals:   02/08/18 1630 02/08/18 1645 02/08/18 1700 02/08/18 1745  BP: (!) 157/85 (!) 152/84 (!) 167/80 (!) 161/91  Pulse: 71 70 81 80  Resp: 16 17 18  (!) 22  Temp:      TempSrc:      SpO2: 99% 98% 99% 97%  Weight:      Height:          Constitutional: Pleasant middle-aged male, moderately built, thinly nourished, frail, disheveled and unkempt, sitting up comfortably in bed without distress. Eyes: PERTLA, lids and conjunctivae normal ENMT: Mucous membranes are dry. Posterior pharynx clear of any exudate or lesions.  Extremely poor dentition with multiple missing teeth and extensive caries. Neck: supple, no masses, no thyromegaly Respiratory: clear to auscultation bilaterally, no wheezing, no crackles. Normal respiratory effort. No accessory muscle use.  Cardiovascular: S1 & S2 heard, regular rate and rhythm, no murmurs / rubs / gallops. No extremity edema. 2+ pedal pulses. No carotid bruits.  Telemetry personally reviewed shows  normal sinus rhythm. Abdomen: No distension, no tenderness, no masses palpated. No hepatosplenomegaly. Bowel sounds normal.  Musculoskeletal: no clubbing / cyanosis. No joint deformity upper and lower extremities. Good ROM, no contractures. Normal muscle tone.  Right forearm and wrist in hard cast. Skin: Flaking and rash over forehead, possibly seborrheic dermatitis. Neurologic: CN 2-12 grossly intact. Sensation intact, DTR normal. Strength 5/5 in all 4 limbs.  Psychiatric: Impaired judgment and insight. Alert and oriented x 3.  Appears slightly anxious but currently not tremulous.    Labs on Admission: I have personally reviewed following labs and imaging studies  CBC: Recent Labs  Lab 02/05/18 0853 02/08/18 1233  WBC 9.2 11.2*  NEUTROABS 7.4  --   HGB 15.1 13.9  HCT 45.6 41.4  MCV 91.2 89.8  PLT 346 366   Basic Metabolic Panel: Recent Labs  Lab 02/05/18 0853 02/08/18  1233  NA 132* 133*  K 4.1 3.7  CL 96* 97*  CO2 25 25  GLUCOSE 117* 105*  BUN <5* <5*  CREATININE 0.84 0.72  CALCIUM 9.0 9.1   Liver Function Tests: Recent Labs  Lab 02/05/18 0853 02/08/18 1233  AST 27 21  ALT 18 17  ALKPHOS 83 72  BILITOT 0.7 0.9  PROT 7.0 6.7  ALBUMIN 3.8 3.6      Radiological Exams on Admission: No results found.  EKG: Independently reviewed.  Sinus rhythm at 72 bpm, normal axis, and no acute changes.  QTc 475 ms.  Assessment/Plan Principal Problem:   Alcohol withdrawal ([place]) Active Problems:   Essential hypertension   Dementia   Gastroesophageal reflux disease   Alcohol abuse   Adjustment disorder with mixed anxiety and depressed mood   Hypertension     1. Alcohol dependence with withdrawal: Admit to telemetry for close monitoring and initiated CIWA protocol along with thiamine, folate and multivitamins.  Clinical social work consulted for resources for outpatient rehab. 2. Benzodiazepine dependence with withdrawal: Patient reportedly takes chronic Ativan but  this is not currently listed on his home medication list. CIWA protocol. 3. Anxiety and depression: Ativan.  Apart from hydroxyzine for anxiety, does not appear to be on any other medications.  May need to verify with his PCP regarding medications that he supposed to be on. 4. Dehydration with mild hyponatremia: Gentle IV normal saline hydration overnight. 5. Essential hypertension: Mildly uncontrolled.  Resume home medications after verified by pharmacy. 6. History of PUD/GERD/gastritis: Reportedly takes PPI and sucralfate.  Initiate after home medications reconciled by pharmacy. 7. Right wrist fracture: Currently has hard cast.  Outpatient follow-up with orthopedics/hand surgery and surgery will need to be rescheduled.   DVT prophylaxis: SCDs Code Status: Full Family Communication: None at bedside Disposition Plan: DC pending clinical improvement, site to be determined.  Patient reportedly will be evicted from his apartment tomorrow. Consults called: None Admission status:, Telemetry, inpatient  Severity of Illness: The appropriate patient status for this patient is INPATIENT. Inpatient status is judged to be reasonable and necessary in order to provide the required intensity of service to ensure the patient's safety. The patient's presenting symptoms, physical exam findings, and initial radiographic and laboratory data in the context of their chronic comorbidities is felt to place them at high risk for further clinical deterioration. Furthermore, it is not anticipated that the patient will be medically stable for discharge from the hospital within 2 midnights of admission. The following factors support the patient status of inpatient.   " The patient's presenting symptoms include anxiety, tremulousness, twitchings, body aches, nausea, visual hallucinations. " The worrisome physical exam findings include hypertension, tachypnea. " The initial radiographic and laboratory data are worrisome  because of sodium 133. " The chronic co-morbidities include alcohol abuse, anxiety with benzodiazepine dependence, HTN, PUD.  Given prolonged history of alcohol abuse, reported benzodiazepine dependence and abrupt cessation of both 48 hours prior to admission, onset of withdrawal symptoms leading patient to come to ED, at risk for decline including DTs, hence requires close monitoring inpatient which will likely cross 2 midnights.   * I certify that at the point of admission it is my clinical judgment that the patient will require inpatient hospital care spanning beyond 2 midnights from the point of admission due to high intensity of service, high risk for further deterioration and high frequency of surveillance required.Vernell Leep MD [facility] Pager [phone]  If  7PM-7AM, please contact night-coverage [email or web address] Password [facility]  02/08/2018, 6:15 PM

## 2018-02-08 NOTE — ED Notes (Signed)
Attempted report 

## 2018-02-08 NOTE — Progress Notes (Signed)
Pt has social issues and is being evicted from his apartment. Spoke in detail with Myriam Jacobson at Dr Biagio Borg office. He will spend 23 obs in [place] and his neighbor Lance Morin will arrange an uber/taxi ride home.

## 2018-02-08 NOTE — Progress Notes (Signed)
Patient is being cancelled and going to [facility] ER for ETOH evaluation.  Patient states he is going to be discharged from his home tomorrow and will need assistance with finding a place to live.    [place] charge nurse was contacted in addition to [place].

## 2018-02-08 NOTE — ED Triage Notes (Addendum)
Per [facility], pt arrives from surgery center after staff reports pt was having hallucinations. Pt drinks alcohol and reports last drink Wednesday. Pt reports out of ativan x2 days. Pt reports "feeling funny" and sweaty. Pt recieved 1L LR bolus en route. Pt denies thoughts of harming self or others to this RN.

## 2018-02-08 NOTE — ED Provider Notes (Signed)
[facility] EMERGENCY DEPARTMENT Provider Note   CSN: [id number] Arrival date & time: 02/08/18  1217     History   Chief Complaint Chief Complaint  Patient presents with  . Hallucinations    HPI Anthony Lamb is a 62 y.o. male with history of hypertension, ulcerative colitis, hyperlipidemia, EtOH use disorder, chronic benzodiazepine use, anxiety is sent to the ER from preop.  Patient was awaiting right wrist surgery however Dr Grandville Silos canceled surgery due to concern of impending withdrawal symptoms.  Patient states that he takes lorazepam daily for anxiety, lately his anxiety has been worse and he has been taking more than prescribed approximately 4-5 a day.  Additionally, he 3 drinks at minimum 3-4 beers nightly but this stopped 2 days ago completely. States he felt so bad he didn't want beer anymore.  Patient states he stopped taking lorazepam because he ran out.  Reports sweating, twitching of his fingers and face, headache, vision changes, nausea, abdominal pain, chronic diarrhea without blood that he attributes to ulcerative colitis.  States that he sees "walls turning green".  Additionally, he heard his mother's voice screaming once last night.  He has not slept in the last 2 to 3 days.  He has had similar symptoms of withdrawal before when he stops taking lorazepam and EtOH.  He denies any seizures.  Unfortunately, patient is getting evicted tomorrow and his biggest concern today appears to be his living situation.  He has been taking ibuprofen for right wrist pain due to known fracture that he knows he is not supposed to take due to ulcers and colitis.  Denies associated fevers, vomiting.  No thoughts of self-harm or suicide, homicidal thoughts, visual hallucinations.  States "got so many problems I cannot stand it".  HPI  Past Medical History:  Diagnosis Date  . Alcohol abuse, in remission   . Anemia   . Ankylosing spondylitis ([place])   . Anxiety   .  Chronic diarrhea   . Chronic pain   . Colitis   . Depression   . Esophagitis   . Gastritis   . GERD (gastroesophageal reflux disease)   . Hypertension   . IBS (irritable bowel syndrome)   . Poor dentition   . SIADH (syndrome of inappropriate ADH production) [facility])     Patient Active Problem List   Diagnosis Date Noted  . Alcohol withdrawal ([place]) 02/08/2018  . IBS (irritable bowel syndrome)   . Hypertension   . Colitis   . Alcohol abuse, in remission   . Adjustment disorder with mixed anxiety and depressed mood 05/11/2017  . Dehydration 12/13/2016  . Alcohol abuse   . SOB (shortness of breath)   . Chest pain 11/11/2016  . Anxiety   . Gastroesophageal reflux disease   . Osteopenia determined by x-ray 08/06/2014  . Stress fracture of calcaneus 08/06/2014  . Vitamin D deficiency 12/18/2013  . Dementia 12/18/2013  . Benign microscopic hematuria 07/06/2013  . SIADH (syndrome of inappropriate ADH production) ([place]) 06/22/2013  . Hyponatremia 06/20/2013  . Ankylosing spondylitis ([place]) 06/20/2013  . Malnutrition of moderate degree ([place]) 06/20/2013  . BPH (benign prostatic hyperplasia) 08/22/2010  . Backache 04/19/2010  . ALCOHOLISM 08/13/2008  . History of colonic polyps 08/13/2008  . Essential hypertension 07/03/2007  . PUD (peptic ulcer disease) 07/03/2007  . Irritable bowel syndrome 07/03/2007    Past Surgical History:  Procedure Laterality Date  . COLONOSCOPY W/ BIOPSIES    . ESOPHAGOGASTRODUODENOSCOPY    . HERNIA REPAIR  Bilateral   . TONSILLECTOMY          Home Medications    Prior to Admission medications   Medication Sig Start Date End Date Taking? Authorizing Provider  acetaminophen (TYLENOL) 500 MG tablet Take 2 tablets (1,000 mg total) by mouth every 8 (eight) hours as needed. 02/05/18  Yes Law, Bea Graff, PA-C  hydrOXYzine (ATARAX/VISTARIL) 25 MG tablet Take 1 tablet (25 mg total) by mouth every 6 (six) hours as needed for anxiety. 06/11/17  Yes Quentin Cornwall,  [place] N, PA-C  lisinopril (PRINIVIL,ZESTRIL) 10 MG tablet Take 1 tablet (10 mg total) by mouth daily. Patient taking differently: Take 10 mg by mouth at bedtime.  08/21/17 02/07/18 Yes Lacroce, Hulen Shouts, MD  pantoprazole (PROTONIX) 40 MG tablet Take 1 tablet (40 mg total) by mouth daily. 04/24/14  Yes Janith Lima, MD  sodium chloride (OCEAN) 0.65 % SOLN nasal spray Place 1-2 sprays into both nostrils as needed for congestion.    Yes [provider]  sucralfate (CARAFATE) 1 G tablet Take 1 tablet (1 g total) by mouth 4 (four) times daily -  with meals and at bedtime. Chew before swallowing 12/17/13  Yes Janith Lima, MD  verapamil (CALAN-SR) 240 MG CR tablet Take 240 mg by mouth at bedtime. 02/09/17  Yes [provider]    Family History Family History  Problem Relation Age of Onset  . Anxiety disorder Mother   . Congestive Heart Failure Mother   . Crohn's disease Mother   . Colon cancer Mother 59  . Arthritis Father   . High blood pressure Father     Social History Social History   Tobacco Use  . Smoking status: Former Smoker    Types: Cigarettes    Last attempt to quit: 2014    Years since quitting: 5.6  . Smokeless tobacco: Never Used  Substance Use Topics  . Alcohol use: Yes    Comment: daily/ 3-4 beers, has not has any in a few days  . Drug use: No     Allergies   Diphenhydramine hcl; Flexeril [cyclobenzaprine]; Hctz [hydrochlorothiazide]; and Nsaids   Review of Systems Review of Systems  Constitutional: Positive for appetite change, chills and diaphoresis.  Eyes: Positive for visual disturbance.  Gastrointestinal: Positive for abdominal pain, diarrhea (chronic) and nausea.  Musculoskeletal: Positive for arthralgias (right wrist).  Neurological: Positive for tremors and headaches.  Psychiatric/Behavioral: Positive for agitation and hallucinations. The patient is nervous/anxious.   All other systems reviewed and are negative.    Physical  Exam Updated Vital Signs BP (!) 167/80   Pulse 81   Temp 98.6 F (37 C) (Oral)   Resp 18   Ht 5' 11"  (1.803 m)   Wt 79.7 kg   SpO2 99%   BMI 24.51 kg/m   Physical Exam  Constitutional: He is oriented to person, place, and time. He appears well-developed and well-nourished.  Non toxic. Appears anxious.   HENT:  Head: Normocephalic and atraumatic.  Nose: Nose normal.  Poor dentition. MMM.   Eyes: Pupils are equal, round, and reactive to light. Conjunctivae and EOM are normal.  Neck: Normal range of motion.  Cardiovascular: Normal rate, regular rhythm and normal heart sounds.  Good cap refill to finger tips. 2+ DP pulses bilaterally  Pulmonary/Chest: Effort normal and breath sounds normal.  Abdominal: Soft. Bowel sounds are normal. There is tenderness.  Diffuse abd tenderness. No G/R/R. No suprapubic or CVA tenderness. Negative Murphy's and McBurney's  Musculoskeletal: Normal range  of motion.  Right wrist/forearm in splint/cast. Able to wiggle right fingers.   Neurological: He is alert and oriented to person, place, and time.  Speech is fluent without obvious dysarthria or dysphasia. Strength 5/5 with hand grip and ankle F/E.   Sensation to light touch intact in hands and feet. CN I and VIII not tested. CN II-XII grossly intact bilaterally.  Slight twitching noted to upper lip, intermittent jerking of hand that stops during active movement of arms.   Skin: Skin is warm and dry. Capillary refill takes less than 2 seconds.  Psychiatric: Thought content normal. His mood appears anxious. His speech is rapid and/or pressured. He is actively hallucinating. He expresses inappropriate judgment.  Auditory hallucinations. Anxious appearing.   Nursing note and vitals reviewed.    ED Treatments / Results  Labs (all labs ordered are listed, but only abnormal results are displayed) Labs Reviewed  COMPREHENSIVE METABOLIC PANEL - Abnormal; Notable for the following components:      Result  Value   Sodium 133 (*)    Chloride 97 (*)    Glucose, Bld 105 (*)    BUN <5 (*)    All other components within normal limits  CBC - Abnormal; Notable for the following components:   WBC 11.2 (*)    All other components within normal limits  RAPID URINE DRUG SCREEN, HOSP PERFORMED - Abnormal; Notable for the following components:   Benzodiazepines POSITIVE (*)    All other components within normal limits  ETHANOL  AMMONIA    EKG EKG Interpretation  Date/Time:  Friday February 08 2018 12:30:19 EDT Ventricular Rate:  72 PR Interval:    QRS Duration: 107 QT Interval:  434 QTC Calculation: 475 R Axis:   102 Text Interpretation:  Sinus rhythm Consider right ventricular hypertrophy Baseline wander in lead(s) II III aVF No significant change since last tracing Confirmed by Wandra Arthurs [phone]) on 02/08/2018 4:50:11 PM   Radiology No results found.  Procedures .Critical Care Performed by: Kinnie Feil, PA-C Authorized by: Kinnie Feil, PA-C   Critical care provider statement:    Critical care time (minutes):  45   Critical care was time spent personally by me on the following activities:  Discussions with consultants, evaluation of patient's response to treatment, examination of patient, ordering and review of laboratory studies, ordering and review of radiographic studies, re-evaluation of patient's condition, obtaining history from patient or surrogate and review of old charts   I assumed direction of critical care for this patient from another provider in my specialty: no     (including critical care time)  Medications Ordered in ED Medications  LORazepam (ATIVAN) injection 0-4 mg (2 mg Intravenous Given 02/08/18 1331)    Or  LORazepam (ATIVAN) tablet 0-4 mg ( Oral See Alternative 02/08/18 1331)  LORazepam (ATIVAN) injection 0-4 mg (has no administration in time range)    Or  LORazepam (ATIVAN) tablet 0-4 mg (has no administration in time range)  thiamine (VITAMIN  B-1) tablet 100 mg ( Oral See Alternative 02/08/18 1333)    Or  thiamine (B-1) injection 100 mg (100 mg Intravenous Given 02/08/18 1333)  sodium chloride 0.9 % bolus 1,000 mL (0 mLs Intravenous Stopped 02/08/18 1545)  ketorolac (TORADOL) 30 MG/ML injection 30 mg (30 mg Intravenous Given 02/08/18 1428)     Initial Impression / Assessment and Plan / ED Course  I have reviewed the triage vital signs and the nursing notes.  Pertinent labs & imaging results  that were available during my care of the patient were reviewed by me and considered in my medical decision making (see chart for details).     Pt with anxiousness, mild tremors, insomnia, nausea, abdominal pain (chronic) consistent with mild/moderate ETOH withdrawal symptoms.  New onset auditory hallucinations as well putting him at higher risk for ETOH/BSD withdrawal complications. Initial CIWA 15.  Will obtain screening labs, CIWA protocol and reassess.  Picture complicated by chronic over use of BZD.  1657: Re-evaluated pt. Jerking only slightly improved.  He remains nauseous, hypertensive, tachypnic.  Will request admission for obs for ETOH/BZD withdrawal. Pt discussed with Dr. Darl Householder  1715: Spoke to hospitalist who will admit patient to tele.   Final Clinical Impressions(s) / ED Diagnoses   Final diagnoses:  Alcohol withdrawal hallucinosis [facility])    ED Discharge Orders    None       Kinnie Feil, PA-C 02/08/18 1713    Drenda Freeze, MD 02/09/18 [phone]

## 2018-02-08 NOTE — Progress Notes (Signed)
Patient with right distal radius fracture, scheduled for ORIF today at [facility]. Due to concerns regarding alcohol withdrawal symptoms and possible impending DTs, the operation was cancelled by anesthesia and patient was sent to [facility] ED for evaluation and treatment.  In addition, he indicated he was going to be evicted from his apartment tomorrow.    Surgical care for his skeletal injury will need to be at least postponed until his medical condition and social environment allows for it to be a wise option.   Micheline Rough, MD Hand Surgery

## 2018-02-08 NOTE — ED Notes (Signed)
ED Provider at bedside. 

## 2018-02-08 NOTE — Anesthesia Preprocedure Evaluation (Deleted)
Anesthesia Evaluation    Reviewed: Allergy & Precautions, Patient's Chart, lab work & pertinent test results  History of Anesthesia Complications Negative for: history of anesthetic complications  Airway        Dental   Pulmonary former smoker,           Cardiovascular hypertension, Pt. on medications      Neuro/Psych PSYCHIATRIC DISORDERS Anxiety Depression Dementia negative neurological ROS     GI/Hepatic PUD, GERD  Medicated,(+)     substance abuse  alcohol use,  IBS    Endo/Other   SIADH Hyponatremia   Renal/GU negative Renal ROS  negative genitourinary   Musculoskeletal  Ankylosing spondylitis    Abdominal   Peds  Hematology negative hematology ROS (+)   Anesthesia Other Findings   Reproductive/Obstetrics                            Anesthesia Physical Anesthesia Plan  ASA: III  Anesthesia Plan:    Post-op Pain Management:  [facility] for Post-op pain   Induction:   PONV Risk Score and Plan:   Airway Management Planned:   Additional Equipment:   Intra-op Plan:   Post-operative Plan:   Informed Consent:   Plan Discussed with: CRNA, Anesthesiologist and Surgeon  Anesthesia Plan Comments: (Patient arrived to preop complaining of diaphoresis, jitteriness, and hallucinations. History of heavy alcohol use, states he currently drinks 4 beers/day, but has not had any in 2 days. He also takes ativan daily, but ran out of the medication 2 days ago. Patient appears to be in acute alcohol withdrawal. Discussed with surgeon, case will be postponed to a later date. Patient will be transported to [facility] for evaluation in the emergency department. )       Anesthesia Quick Evaluation

## 2018-02-09 DIAGNOSIS — F1320 Sedative, hypnotic or anxiolytic dependence, uncomplicated: Secondary | ICD-10-CM

## 2018-02-09 DIAGNOSIS — F13231 Sedative, hypnotic or anxiolytic dependence with withdrawal delirium: Secondary | ICD-10-CM

## 2018-02-09 DIAGNOSIS — F1010 Alcohol abuse, uncomplicated: Secondary | ICD-10-CM

## 2018-02-09 DIAGNOSIS — F10232 Alcohol dependence with withdrawal with perceptual disturbance: Secondary | ICD-10-CM

## 2018-02-09 LAB — COMPREHENSIVE METABOLIC PANEL
ALBUMIN: 3.2 g/dL — AB (ref 3.5–5.0)
ALT: 16 U/L (ref 0–44)
ANION GAP: 10 (ref 5–15)
AST: 18 U/L (ref 15–41)
Alkaline Phosphatase: 68 U/L (ref 38–126)
CO2: 24 mmol/L (ref 22–32)
Calcium: 8.5 mg/dL — ABNORMAL LOW (ref 8.9–10.3)
Chloride: 98 mmol/L (ref 98–111)
Creatinine, Ser: 0.66 mg/dL (ref 0.61–1.24)
GFR calc Af Amer: >60 mL/min (ref >60)
GFR calc non Af Amer: >60 mL/min (ref >60)
GLUCOSE: 95 mg/dL (ref 70–99)
POTASSIUM: 3.4 mmol/L — AB (ref 3.5–5.1)
SODIUM: 132 mmol/L — AB (ref 135–145)
Total Bilirubin: 0.8 mg/dL (ref 0.3–1.2)
Total Protein: 6.2 g/dL — ABNORMAL LOW (ref 6.5–8.1)

## 2018-02-09 LAB — HIV ANTIBODY (ROUTINE TESTING W REFLEX): HIV Screen 4th Generation wRfx: NONREACTIVE

## 2018-02-09 MED ORDER — HYDROCORTISONE 1 % EX CREA
TOPICAL_CREAM | Freq: Three times a day (TID) | CUTANEOUS | Status: DC
  Administered 2018-02-09: 1 via TOPICAL
  Administered 2018-02-09 – 2018-02-10 (×4): via TOPICAL
  Administered 2018-02-10: 1 via TOPICAL
  Administered 2018-02-10 – 2018-02-11 (×2): via TOPICAL
  Administered 2018-02-12: 1 via TOPICAL
  Filled 2018-02-09 (×2): qty 28

## 2018-02-09 MED ORDER — ENSURE ENLIVE PO LIQD
237.0000 mL | Freq: Three times a day (TID) | ORAL | Status: DC
  Administered 2018-02-10 – 2018-02-12 (×4): 237 mL via ORAL

## 2018-02-09 NOTE — Progress Notes (Signed)
Patient ambulated in the hallway without help, approximttely 400 feet.

## 2018-02-09 NOTE — Progress Notes (Signed)
Initial Nutrition Assessment  DOCUMENTATION CODES:   Not applicable  INTERVENTION:  Ensure Enlive po TID, each supplement provides 350 kcal and 20 grams of protein  Consider consult to dentistry/oral surgery? Pt notes he "cannot eat" because he "has no teeth"-specifically his molars. (to clarify, they are rotted down & sensitive to temperature). He reports he cannot get dentures until rotted teeth removed-he says this is causing him to have poor intake/lose weight.  He is also limited by R arm being casted as this is dominant arm. Noted food items patient felt he could do best with given teeth/arm. Will order periodically at meals.   Reviewed options on menu at bedside.   Offered diet change to D3-declined.   NUTRITION DIAGNOSIS:  Inadequate oral intake related to mouth pain + Lack of teeth as evidenced by per patient/family report.  GOAL:  Patient will meet greater than or equal to 90% of their needs  MONITOR:  PO intake, Supplement acceptance, Diet advancement, Labs, Weight trends, I & O's  REASON FOR ASSESSMENT:  Malnutrition Screening Tool    ASSESSMENT:  62 y/o male PMHx ongoing etoh abuse, anxiety disorder, chronic diarrhea r/t IBS, UC, Chronic pain, depression, HTN, SIADH. Recently fractured R wrist. Was schedule to undergo surgical fixation on 9/6, but procedure canceled as anesthesiologist noted pt seemed to be going through ETOH withdrawal-abruptly stopped drinking 48 hr prior. Referred to ED and admitted for monitoring.   Patient being seen for MST of 2. He notes unintentional weight loss and poor appetite. However, upon talking to patient, it sounds his appetite is actually not the problem, rather it is his teeth, or lack thereof. He says he "cant eat"  because "I dont have molars to chew". Apparently, he still has rotted down teeth (which need to be removed before he could get dentures), but the remaining teeth have no utility. They also very sensitive to cold.   RD  asked if he though a diet modification to D3 would help. He says it does not matter if food comes cut up because he still couldn't chew them.   He also is currently stressed regarding social circumstances- Impending eviction from house. Social worker already working with patient.   Wt wise, he says he was over 200 lbs 1-2 years ago. He reports gradual, consistent wt loss. He attributes this to his teeth issues. Per chart, he was ~190-195 lbs at this time last year. While clinically significant, does not meet malnutrition criteria.   He says he has chronic diarrhea from IBS (occurs randomly) and has chronic nausea due to PUD.   RD asked if he would do better with more a liquid diet given lack of teeth. He says he cannot eat soup well, because his dominant arm is casted and he cannot use a spoon. He cannot eat cold liquids (ice cream) due to his sensitive rotted teeth.   Give many food barriers, will increase ensure to TID. He was Agreeable.   Labs: Albumin: 3.2. K:3.4, WBC:11.2, Na: 132 Meds: Folate, Ensure (accepting), Thiamin, mvi with min, ppi, prn oxycodone  Recent Labs  Lab 02/05/18 0853 02/08/18 1233 02/09/18 0418  NA 132* 133* 132*  K 4.1 3.7 3.4*  CL 96* 97* 98  CO2 25 25 24   BUN <5* <5* <5*  CREATININE 0.84 0.72 0.66  CALCIUM 9.0 9.1 8.5*  GLUCOSE 117* 105* 95   NUTRITION - FOCUSED PHYSICAL EXAM:   Most Recent Value  Orbital Region  Mild depletion  Upper Arm Region  No depletion  Thoracic and Lumbar Region  No depletion  Buccal Region  Mild depletion  Temple Region  Mild depletion  Clavicle Bone Region  No depletion  Clavicle and Acromion Bone Region  No depletion  Scapular Bone Region  No depletion  Dorsal Hand  No depletion  Patellar Region  No depletion  Anterior Thigh Region  No depletion  Posterior Calf Region  No depletion  Edema (RD Assessment)  Mild     Diet Order:   Diet Order            DIET SOFT Room service appropriate? Yes; Fluid consistency: Thin   Diet effective now             EDUCATION NEEDS:  No education needs have been identified at this time  Skin:  Skin Assessment: Reviewed RN Assessment  Last BM:  9/7  Height:  Ht Readings from Last 1 Encounters:  02/08/18 5' 11"  (1.803 m)   Weight:  Wt Readings from Last 1 Encounters:  02/08/18 79.7 kg   Wt Readings from Last 10 Encounters:  02/08/18 79.7 kg  02/05/18 79.4 kg  01/29/18 79.4 kg  12/21/17 79.4 kg  12/17/17 79.4 kg  10/01/17 80.3 kg  09/29/17 80.3 kg  08/31/17 83.9 kg  08/19/17 84.6 kg  07/31/17 83.9 kg   Ideal Body Weight:  78.18 kg  BMI:  Body mass index is 24.51 kg/m.  Estimated Nutritional Needs:  Kcal:  2000-2250 (25-28 kcal/kg bw) Protein:  88-104g Pro (1.1-1.3g/kg bw) Fluid:  2-2.2 L (1 ml/kcal)  Burtis Junes RD, LDN, CNSC Clinical Nutrition Available Tues-Sat via Pager: [phone] 02/09/2018 7:03 PM

## 2018-02-09 NOTE — Progress Notes (Signed)
PROGRESS NOTE  Anthony Lamb LZJ:[id number] DOB: 29-Oct-1955 DOA: 02/08/2018 PCP: Sandi Mariscal, MD  Brief Narrative: 62 year old man PMH ongoing alcohol abuse, chronic benzodiazepine use daily, anxiety, depression, fell and sustained a right wrist fracture proximally week ago, plans were for ORIF 9/6 but he was noted to have symptoms concerning for alcohol and benzodiazepine withdrawal and therefore surgery was canceled and patient was sent to the emergency department.  Abruptly stopped drinking 48 hours prior to admission.  Ran out of Ativan which she takes 1 mg 3 times daily 48 hours prior to admission.  Noted to be hallucinating in the emergency department.  Admitted for alcohol withdrawal  Assessment/Plan Alcohol withdrawal, DTs. --still symptomatic. Continue CIWA, vitamins.  Alcohol abuse  --Social work consultation to provide patient with resources.  Benzodiazepine dependence with acute withdrawal.  Takes Ativan 1 mg 3 times daily. --Resume scheduled Ativan ondischarge.  This will need to be tapered in the outpatient setting.  Anxiety, depression --Recommend close outpatient follow-up and consideration of alternative agents to hydroxyzine and Ativan.  Closed right distal radius fracture followed by orthopedics as an outpatient.  Social.  Reportedly being evicted from his apartment. --CSW consult  DVT prophylaxis: SCDs Code Status: Full Family Communication: none Disposition Plan: home    [name], MD  [facility] Direct contact: [phone] --[place]ia [place]  --[email or web address]; password [email or web address]  7PM-7AM contact night coverage as above 02/09/2018, 3:48 PM  LOS: 1 day   Consultants:    Procedures:    Antimicrobials:    Interval history/Subjective: Feels shaky. No hallucinations. Feels no improvement yet.  Objective: Vitals:  Vitals:   02/09/18 0531 02/09/18 1143  BP: 137/81 (!) 148/80  Pulse: 67 63  Resp: 18 16  Temp: 98 F (36.7 C) 97.9 F  (36.6 C)  SpO2: 97% 94%    Exam:  Constitutional:  . Appears mildly anxious, uncomfortable; not toxic Eyes:  . pupils and irises appear normal . Normal lids  ENMT:  . grossly normal hearing  . Lips appear normal Respiratory:  . CTA bilaterally, no w/r/r.  . Respiratory effort normal.  Cardiovascular:  . RRR, no m/r/g . No LE extremity edema   Abdomen:  . soft Musculoskeletal:  . RUE, LUE . strength and tone normal Psychiatric:  . Mental status o Mood depressed, affect flat  I have personally reviewed the following:   Labs:  Basic metabolic panel notable for potassium 3.4.  LFTs unremarkable.  CBC unremarkable on admission.  Modest leukocytosis 11.2.  Serum alcohol is negative.  Urine drug screen positive for benzodiazepines.  Imaging studies:    Medical tests:  EKG SR   Scheduled Meds: . feeding supplement (ENSURE ENLIVE)  237 mL Oral BID BM  . folic acid  1 mg Oral Daily  . hydrocortisone cream   Topical TID  . Influenza vac split quadrivalent PF  0.5 mL Intramuscular Tomorrow-1000  . lisinopril  10 mg Oral QHS  . LORazepam  0-4 mg Oral Q6H   Followed by  . [START ON 02/10/2018] LORazepam  0-4 mg Oral Q12H  . multivitamin with minerals  1 tablet Oral Daily  . pantoprazole  40 mg Oral Daily  . sodium chloride flush  3 mL Intravenous Q12H  . sucralfate  1 g Oral TID WC & HS  . thiamine  100 mg Oral Daily   Or  . thiamine  100 mg Intravenous Daily  . verapamil  240 mg Oral QHS   Continuous Infusions:  Principal Problem:  Alcohol withdrawal ([facility]) Active Problems:   Essential hypertension   Dementia   Gastroesophageal reflux disease   Alcohol abuse   Adjustment disorder with mixed anxiety and depressed mood   Hypertension   LOS: 1 day

## 2018-02-10 DIAGNOSIS — F10230 Alcohol dependence with withdrawal, uncomplicated: Secondary | ICD-10-CM

## 2018-02-10 LAB — SURGICAL PCR SCREEN
MRSA, PCR: NEGATIVE
STAPHYLOCOCCUS AUREUS: NEGATIVE

## 2018-02-10 MED ORDER — CHLORHEXIDINE GLUCONATE CLOTH 2 % EX PADS
6.0000 | MEDICATED_PAD | Freq: Two times a day (BID) | CUTANEOUS | Status: DC
  Administered 2018-02-10: 6 via TOPICAL

## 2018-02-10 MED ORDER — MUPIROCIN 2 % EX OINT
1.0000 "application " | TOPICAL_OINTMENT | Freq: Two times a day (BID) | CUTANEOUS | Status: DC
  Administered 2018-02-10 – 2018-02-12 (×4): 1 via NASAL
  Filled 2018-02-10 (×3): qty 22

## 2018-02-10 NOTE — Progress Notes (Signed)
  PROGRESS NOTE  Anthony Lamb HAL:[id number] DOB: [birth year] DOA: 02/08/2018 PCP: Sandi Mariscal, MD  Brief Narrative: 62 year old man PMH ongoing alcohol abuse, chronic benzodiazepine use daily, anxiety, depression, fell and sustained a right wrist fracture proximally week ago, plans were for ORIF 9/6 but he was noted to have symptoms concerning for alcohol and benzodiazepine withdrawal and therefore surgery was canceled and patient was sent to the emergency department.  Abruptly stopped drinking 48 hours prior to admission.  Ran out of Ativan which she takes 1 mg 3 times daily 48 hours prior to admission.  Noted to be hallucinating in the emergency department.  Admitted for alcohol withdrawal  Assessment/Plan Alcohol withdrawal, DTs. --Still symptomatic.  We will continue current treatment with Ativan.  Alcohol abuse  --Social work consultation pending.  Benzodiazepine dependence with acute withdrawal.  Takes Ativan 1 mg 3 times daily. --Continue CIWA  Anxiety, depression --Recommend close outpatient follow-up and consideration of alternative agents to hydroxyzine and Ativan.  Closed right distal radius fracture followed by orthopedics as an outpatient.  Social.  Reportedly being evicted from his apartment. --CSW consult  Appears to be improving overall.  Anticipate discharge 9/9.  DVT prophylaxis: SCDs Code Status: Full Family Communication: none Disposition Plan: home    [name], MD  [facility] Direct contact: [phone] --Via amion app OR  --[email or web address]; password [email or web address]  7PM-7AM contact night coverage as above 02/10/2018, 9:12 AM  LOS: 2 days   Consultants:    Procedures:    Antimicrobials:    Interval history/Subjective: Feels about the same, shaky, clamy, sweaty at times. Eating ok. Some nausea.  Objective: Vitals:  Vitals:   02/10/18 0436 02/10/18 0600  BP: (!) 100/57 128/72  Pulse: (!) 54 (!) 56  Resp: 16   Temp: 98.1 F (36.7 C)     SpO2: 97%     Exam:  Constitutional:   . Appears anxious, mildly uncomfortable, non-toxic Eyes:  . pupils and irises appear normal . Normal lids  ENMT:  . grossly normal hearing  . Lips appear normal Respiratory:  . CTA bilaterally, no w/r/r.  . Respiratory effort normal.  Cardiovascular:  . RRR, no m/r/g Psychiatric:  . Mental status o Mood depressed; flat affect   I have personally reviewed the following:   Labs:  No labs  Imaging studies:    Medical tests:  EKG SR   Scheduled Meds: . feeding supplement (ENSURE ENLIVE)  237 mL Oral TID BM  . folic acid  1 mg Oral Daily  . hydrocortisone cream   Topical TID  . lisinopril  10 mg Oral QHS  . LORazepam  0-4 mg Oral Q6H   Followed by  . LORazepam  0-4 mg Oral Q12H  . multivitamin with minerals  1 tablet Oral Daily  . pantoprazole  40 mg Oral Daily  . sodium chloride flush  3 mL Intravenous Q12H  . sucralfate  1 g Oral TID WC & HS  . thiamine  100 mg Oral Daily   Or  . thiamine  100 mg Intravenous Daily  . verapamil  240 mg Oral QHS   Continuous Infusions:  Principal Problem:   Alcohol withdrawal (HCC) Active Problems:   Essential hypertension   Dementia   Gastroesophageal reflux disease   Alcohol abuse   Adjustment disorder with mixed anxiety and depressed mood   Hypertension   LOS: 2 days

## 2018-02-11 ENCOUNTER — Inpatient Hospital Stay (HOSPITAL_COMMUNITY): Payer: Medicaid Other

## 2018-02-11 ENCOUNTER — Inpatient Hospital Stay (HOSPITAL_COMMUNITY): Payer: Medicaid Other | Admitting: Certified Registered"

## 2018-02-11 ENCOUNTER — Encounter (HOSPITAL_COMMUNITY)
Admission: RE | Disposition: A | Discharge status: Home / Self Care | Payer: Self-pay | Source: Ambulatory Visit | Attending: Family Medicine

## 2018-02-11 ENCOUNTER — Encounter (HOSPITAL_COMMUNITY): Payer: Self-pay | Admitting: Orthopedic Surgery

## 2018-02-11 ENCOUNTER — Ambulatory Visit (HOSPITAL_COMMUNITY): Admission: RE | Admit: 2018-02-11 | Payer: Medicaid Other | Source: Ambulatory Visit | Admitting: Orthopedic Surgery

## 2018-02-11 HISTORY — PX: OPEN REDUCTION INTERNAL FIXATION (ORIF) DISTAL RADIAL FRACTURE: SHX5989

## 2018-02-11 SURGERY — OPEN REDUCTION INTERNAL FIXATION (ORIF) DISTAL RADIUS FRACTURE
Anesthesia: Monitor Anesthesia Care | Site: Wrist | Laterality: Right

## 2018-02-11 MED ORDER — BUPIVACAINE-EPINEPHRINE (PF) 0.5% -1:200000 IJ SOLN
INTRAMUSCULAR | Status: DC | PRN
  Administered 2018-02-11: 25 mL via PERINEURAL

## 2018-02-11 MED ORDER — 0.9 % SODIUM CHLORIDE (POUR BTL) OPTIME
TOPICAL | Status: DC | PRN
  Administered 2018-02-11: 1000 mL

## 2018-02-11 MED ORDER — MIDAZOLAM HCL 2 MG/2ML IJ SOLN
INTRAMUSCULAR | Status: AC
  Administered 2018-02-11: 2 mg via INTRAVENOUS
  Filled 2018-02-11: qty 2

## 2018-02-11 MED ORDER — MIDAZOLAM HCL 2 MG/2ML IJ SOLN
2.0000 mg | Freq: Once | INTRAMUSCULAR | Status: AC
  Administered 2018-02-11: 2 mg via INTRAVENOUS
  Filled 2018-02-11: qty 2

## 2018-02-11 MED ORDER — POVIDONE-IODINE 10 % EX SWAB
2.0000 "application " | Freq: Once | CUTANEOUS | Status: AC
  Administered 2018-02-11: 2 via TOPICAL

## 2018-02-11 MED ORDER — FENTANYL CITRATE (PF) 250 MCG/5ML IJ SOLN
INTRAMUSCULAR | Status: DC | PRN
  Administered 2018-02-11: 50 ug via INTRAVENOUS

## 2018-02-11 MED ORDER — LACTATED RINGERS IV SOLN
INTRAVENOUS | Status: DC
  Administered 2018-02-11 (×2): via INTRAVENOUS

## 2018-02-11 MED ORDER — FENTANYL CITRATE (PF) 100 MCG/2ML IJ SOLN
INTRAMUSCULAR | Status: AC
  Administered 2018-02-11: 100 ug via INTRAVENOUS
  Filled 2018-02-11: qty 2

## 2018-02-11 MED ORDER — CHLORHEXIDINE GLUCONATE 4 % EX LIQD
60.0000 mL | Freq: Once | CUTANEOUS | Status: AC
  Administered 2018-02-11: 4 via TOPICAL
  Filled 2018-02-11: qty 60

## 2018-02-11 MED ORDER — ACETAMINOPHEN 325 MG PO TABS: 650.0000 mg | ORAL_TABLET | Freq: Four times a day (QID) | ORAL | 0 refills | Status: DC | PRN

## 2018-02-11 MED ORDER — FENTANYL CITRATE (PF) 100 MCG/2ML IJ SOLN
100.0000 ug | Freq: Once | INTRAMUSCULAR | Status: AC
  Administered 2018-02-11: 100 ug via INTRAVENOUS
  Filled 2018-02-11: qty 2

## 2018-02-11 MED ORDER — CEFAZOLIN SODIUM-DEXTROSE 2-4 GM/100ML-% IV SOLN
2.0000 g | INTRAVENOUS | Status: AC
  Administered 2018-02-11: 2 g via INTRAVENOUS
  Filled 2018-02-11: qty 100

## 2018-02-11 MED ORDER — OXYCODONE HCL 5 MG PO TABS: 0.0000 mg | ORAL_TABLET | Freq: Four times a day (QID) | ORAL | 0 refills | Status: DC | PRN

## 2018-02-11 MED ORDER — LIDOCAINE-EPINEPHRINE (PF) 1.5 %-1:200000 IJ SOLN
INTRAMUSCULAR | Status: DC | PRN
  Administered 2018-02-11: 5 mL via PERINEURAL

## 2018-02-11 MED ORDER — PROPOFOL 500 MG/50ML IV EMUL
INTRAVENOUS | Status: DC | PRN
  Administered 2018-02-11: 100 ug/kg/min via INTRAVENOUS

## 2018-02-11 SURGICAL SUPPLY — 50 items
BIT DRILL 2 FAST STEP (BIT) ×2 IMPLANT
BIT DRILL 2.5X4 QC (BIT) ×3 IMPLANT
BLADE SURG 15 STRL LF DISP TIS (BLADE) ×1 IMPLANT
BLADE SURG 15 STRL SS (BLADE) ×3
BNDG CMPR 9X4 STRL LF SNTH (GAUZE/BANDAGES/DRESSINGS) ×1
BNDG COHESIVE 4X5 TAN STRL (GAUZE/BANDAGES/DRESSINGS) ×3 IMPLANT
BNDG ESMARK 4X9 LF (GAUZE/BANDAGES/DRESSINGS) ×3 IMPLANT
BNDG GAUZE ELAST 4 BULKY (GAUZE/BANDAGES/DRESSINGS) ×3 IMPLANT
BRUSH SCRUB EZ PLAIN DRY (MISCELLANEOUS) ×3 IMPLANT
CANISTER SUCT 3000ML PPV (MISCELLANEOUS) ×3 IMPLANT
CHLORAPREP W/TINT 26ML (MISCELLANEOUS) ×3 IMPLANT
CORDS BIPOLAR (ELECTRODE) ×3 IMPLANT
COVER BACK TABLE 60X90IN (DRAPES) ×3 IMPLANT
COVER SURGICAL LIGHT HANDLE (MISCELLANEOUS) ×3 IMPLANT
CUFF TOURNIQUET SINGLE 18IN (TOURNIQUET CUFF) ×2 IMPLANT
DRAPE C-ARM 42X72 X-RAY (DRAPES) ×3 IMPLANT
DRAPE SURG 17X23 STRL (DRAPES) ×3 IMPLANT
DRSG ADAPTIC 3X8 NADH LF (GAUZE/BANDAGES/DRESSINGS) ×3 IMPLANT
GAUZE SPONGE 4X4 12PLY STRL (GAUZE/BANDAGES/DRESSINGS) ×3 IMPLANT
GAUZE SPONGE 4X4 12PLY STRL LF (GAUZE/BANDAGES/DRESSINGS) ×3 IMPLANT
GLOVE BIO SURGEON STRL SZ7.5 (GLOVE) ×3 IMPLANT
GLOVE BIOGEL PI IND STRL 8 (GLOVE) ×1 IMPLANT
GLOVE BIOGEL PI INDICATOR 8 (GLOVE) ×2
GOWN STRL REUS W/ TWL XL LVL3 (GOWN DISPOSABLE) ×1 IMPLANT
GOWN STRL REUS W/TWL XL LVL3 (GOWN DISPOSABLE) ×3
KIT BASIN OR (CUSTOM PROCEDURE TRAY) ×3 IMPLANT
NEEDLE HYPO 22GX1.5 SAFETY (NEEDLE) ×1 IMPLANT
NS IRRIG 1000ML POUR BTL (IV SOLUTION) ×3 IMPLANT
PACK ORTHO EXTREMITY (CUSTOM PROCEDURE TRAY) ×3 IMPLANT
PAD CAST 4YDX4 CTTN HI CHSV (CAST SUPPLIES) ×1 IMPLANT
PADDING CAST COTTON 4X4 STRL (CAST SUPPLIES) ×3
PADDING CAST SYNTHETIC 4 (CAST SUPPLIES) ×2
PADDING CAST SYNTHETIC 4X4 STR (CAST SUPPLIES) IMPLANT
PEG SUBCHONDRAL SMOOTH 2.0X22 (Peg) ×9 IMPLANT
PEG SUBCHONDRAL SMOOTH 2.0X24 (Peg) ×6 IMPLANT
PEG SUBCHONDRAL SMOOTH 2.0X26 (Peg) ×2 IMPLANT
PENCIL BUTTON HOLSTER BLD 10FT (ELECTRODE) ×2 IMPLANT
PLATE SHORT 24.4X51.3 RT (Plate) ×3 IMPLANT
SCREW CORT 3.5X14 LNG (Screw) ×3 IMPLANT
SCREW CORT 3.5X15 (Screw) ×3 IMPLANT
SCREW CORT 3.5X16 LNG (Screw) ×2 IMPLANT
SLING ARM FOAM STRAP LRG (SOFTGOODS) ×3 IMPLANT
SPLINT PLASTER EXTRA FAST 3X15 (CAST SUPPLIES) ×2
SPLINT PLASTER GYPS XFAST 3X15 (CAST SUPPLIES) IMPLANT
SUT VIC AB 2-0 CT3 27 (SUTURE) ×3 IMPLANT
SUT VICRYL 4-0 PS2 18IN ABS (SUTURE) ×2 IMPLANT
TOWEL OR 17X24 6PK STRL BLUE (TOWEL DISPOSABLE) ×3 IMPLANT
TUBE CONNECTING 12'X1/4 (SUCTIONS) ×1
TUBE CONNECTING 12X1/4 (SUCTIONS) ×2 IMPLANT
UNDERPAD 30X30 (UNDERPADS AND DIAPERS) ×3 IMPLANT

## 2018-02-11 NOTE — Anesthesia Preprocedure Evaluation (Addendum)
Anesthesia Evaluation  Patient identified by MRN, date of birth, ID band Patient awake    Reviewed: Allergy & Precautions, NPO status , Patient's Chart, lab work & pertinent test results  History of Anesthesia Complications (+) PONV and history of anesthetic complications  Airway Mallampati: I  TM Distance: >3 FB Neck ROM: Full    Dental  (+) Poor Dentition, Edentulous Upper, Edentulous Lower   Pulmonary former smoker,    breath sounds clear to auscultation       Cardiovascular hypertension, Pt. on medications  Rhythm:Regular     Neuro/Psych PSYCHIATRIC DISORDERS Anxiety Depression Dementia Ankylosing spondylitis      GI/Hepatic PUD, GERD  Medicated,(+)     substance abuse  alcohol use,  IBS    Endo/Other   SIADH Hyponatremia   Renal/GU negative Renal ROS  negative genitourinary   Musculoskeletal  Ankylosing spondylitis    Abdominal   Peds  Hematology negative hematology ROS (+)   Anesthesia Other Findings   Reproductive/Obstetrics                          Anesthesia Physical Anesthesia Plan  ASA: III  Anesthesia Plan: MAC and Regional   Post-op Pain Management:    Induction:   PONV Risk Score and Plan: 1 and Ondansetron  Airway Management Planned: Nasal Cannula  Additional Equipment: None  Intra-op Plan:   Post-operative Plan:   Informed Consent: I have reviewed the patients History and Physical, chart, labs and discussed the procedure including the risks, benefits and alternatives for the proposed anesthesia with the patient or authorized representative who has indicated his/her understanding and acceptance.   Dental advisory given  Plan Discussed with: CRNA and Surgeon  Anesthesia Plan Comments:         Anesthesia Quick Evaluation

## 2018-02-11 NOTE — Op Note (Signed)
02/08/2018 - 02/11/2018  7:31 PM  PATIENT:  Anthony Lamb  62 y.o. male  PRE-OPERATIVE DIAGNOSIS: Displaced right distal radius fracture  POST-OPERATIVE DIAGNOSIS:  Same  PROCEDURE: ORIF right displaced distal radius fracture, extra-articular, 25607  SURGEON: Rayvon Char. Grandville Silos, MD  PHYSICIAN ASSISTANT: Morley Kos, OPA-C  ANESTHESIA:  Regional / MAC  SPECIMENS:  None  DRAINS: None  EBL:  less than 50 mL  PREOPERATIVE INDICATIONS:  Anthony Lamb is a  62 y.o. male with a displaced right extra-articular distal radius fracture.  The risks benefits and alternatives were discussed with the patient preoperatively including but not limited to the risks of infection, bleeding, nerve injury, cardiopulmonary complications, the need for revision surgery, among others, and the patient verbalized understanding and consented to proceed.  OPERATIVE IMPLANTS: Biomet DVR volar plate/pegs/screws  OPERATIVE PROCEDURE: After receiving prophylactic antibiotics and a regional block, the patient was escorted to the operative theatre and placed in a supine position.  General anesthesia was administered.  A surgical "time-out" was performed during which the planned procedure, proposed operative site, and the correct patient identity were compared to the operative consent and agreement confirmed by the circulating nurse according to current facility policy. Following application of a tourniquet to the operative extremity, the exposed skin was pre-scrubbed with a Hibiclens scrub brush and then was prepped with Chloraprep and draped in the usual sterile fashion. The limb was exsanguinated with an Esmarch bandage and the tourniquet inflated to approximately 151mHg higher than systolic BP.   A sinusoidal-shaped incision was marked and made over the FCR axis and the distal forearm. The skin was incised sharply with scalpel, subcutaneous tissues with blunt and spreading dissection. The FCR axis was  exploited deeply. The pronator quadratus was reflected in an L-shaped ulnarly and the brachioradialis was not split in a Z-plasty fashion for later reapproximation. The fracture was inspected and provisionally reduced.  This was confirmed fluoroscopically. The appropriately sized plate was selected and found to fit well. It was placed in its provisional alignment of the radius and this was confirmed fluoroscopically.  It was secured to the radius with a screw through the slotted hole.  Additional adjustments were made as necessary, and the distal holes were all drilled and filled.  Peg/screw length distally was selected on the shorter side of measurements to minimize the risk for dorsal cortical penetration. The remainder of the proximal holes were drilled and filled.   Final images were obtained and the DRUJ was examined for stability. It was found to be sufficiently stable. The wound was then copiously irrigated and the PQ repaired with 2-0 Vicryl.  Tourniquet was released and additional hemostasis obtained and the skin was closed with 2-0 Vicryl deep dermal buried sutures followed by running 4-0 Vicryl Rapide horizontal mattress suture in the skin. A bulky dressing with a volar plaster component was applied and the patient was taken to the recovery room in stable condition.  DISPOSITION: The patient will be discharged home today with typical post-op instructions, returning in 10-15 days for reevaluation with new x-rays of the affected wrist out of the splint to include an inclined lateral, then transition to a removable Velcro wrist splint and determination whether to engage in formal therapy or not.

## 2018-02-11 NOTE — Anesthesia Procedure Notes (Signed)
Procedure Name: [place] Performed by: Valetta Fuller, CRNA Pre-anesthesia Checklist: Patient being monitored, Suction available, Emergency Drugs available and Patient identified Oxygen Delivery Method: Simple face mask

## 2018-02-11 NOTE — Progress Notes (Signed)
  PROGRESS NOTE  Anthony Lamb TOI:[id number] DOB: 12/03/1955 DOA: 02/08/2018 PCP: Anthony Mariscal, MD  Brief Narrative: 62 year old man PMH ongoing alcohol abuse, chronic benzodiazepine use daily, anxiety, depression, fell and sustained a right wrist fracture proximally week ago, plans were for ORIF 9/6 but he was noted to have symptoms concerning for alcohol and benzodiazepine withdrawal and therefore surgery was canceled and patient was sent to the emergency department.  Abruptly stopped drinking 48 hours prior to admission.  Ran out of Ativan which she takes 1 mg 3 times daily 48 hours prior to admission.  Noted to be hallucinating in the emergency department.  Admitted for alcohol withdrawal  Assessment/Plan Alcohol withdrawal, DTs. --Appears to be resolving.  Continue Ativan as needed.  Alcohol abuse  --Await social work consultation  Benzodiazepine dependence with acute withdrawal.  Takes Ativan 1 mg 3 times daily. --Continue CIWA  Anxiety, depression --Recommend close outpatient follow-up and consideration of alternative agents to hydroxyzine and Ativan.  Closed right distal radius fracture followed by orthopedics as an outpatient. --Plan for operative fixation today  Social.  Reportedly being evicted from his apartment. --CSW consult awaited  Improving.  Anticipate discharge 9/10 in the morning  DVT prophylaxis: SCDs Code Status: Full Family Communication: none Disposition Plan: home    Anthony Hodgkins, MD  [facility] Direct contact: [phone] --Via amion app OR  --[email or web address]; password [email or web address]  7PM-7AM contact night coverage as above 02/11/2018, 11:57 AM  LOS: 3 days   Consultants:    Procedures:    Antimicrobials:    Interval history/Subjective: Feels better today.  Objective: Vitals:  Vitals:   02/11/18 0339 02/11/18 0627  BP: 123/69 102/64  Pulse:  (!) 52  Resp:    Temp: 98 F (36.7 C) 98.2 F (36.8 C)  SpO2:  96%     Exam: Constitutional:   . Appears calm and comfortable Respiratory:  . CTA bilaterally, no w/r/r.  . Respiratory effort normal.  Cardiovascular:  . RRR, no m/r/g Psychiatric:  . Mental status o Mood, affect appropriate  I have personally reviewed the following:   Labs:  No labs  Scheduled Meds: . feeding supplement (ENSURE ENLIVE)  237 mL Oral TID BM  . folic acid  1 mg Oral Daily  . hydrocortisone cream   Topical TID  . lisinopril  10 mg Oral QHS  . LORazepam  0-4 mg Oral Q12H  . multivitamin with minerals  1 tablet Oral Daily  . mupirocin ointment  1 application Nasal BID  . pantoprazole  40 mg Oral Daily  . sodium chloride flush  3 mL Intravenous Q12H  . sucralfate  1 g Oral TID WC & HS  . thiamine  100 mg Oral Daily   Or  . thiamine  100 mg Intravenous Daily  . verapamil  240 mg Oral QHS   Continuous Infusions: .  ceFAZolin (ANCEF) IV    . lactated ringers 75 mL/hr at 02/11/18 1050    Principal Problem:   Alcohol withdrawal (Independence) Active Problems:   Essential hypertension   Dementia   Gastroesophageal reflux disease   Alcohol abuse   Adjustment disorder with mixed anxiety and depressed mood   Hypertension   LOS: 3 days

## 2018-02-11 NOTE — Progress Notes (Signed)
Patient off floor to OR

## 2018-02-11 NOTE — Anesthesia Procedure Notes (Signed)
Anesthesia Regional Block: Supraclavicular block   Pre-Anesthetic Checklist: ,, timeout performed, Correct Patient, Correct Site, Correct Laterality, Correct Procedure, Correct Position, site marked, Risks and benefits discussed,  Surgical consent,  Pre-op evaluation,  At surgeon's request and post-op pain management  Laterality: Upper and Right  Prep: chloraprep       Needles:  Injection technique: Single-shot  Needle Type: Echogenic Needle          Additional Needles:   Procedures:,,,, ultrasound used (permanent image in chart),,,,  Narrative:  Start time: 02/11/2018 5:07 PM End time: 02/11/2018 5:10 PM Injection made incrementally with aspirations every 5 mL.  Performed by: Personally   Additional Notes: H+P and labs reviewed, risks and benefits discussed with patient, procedure tolerated well without complications

## 2018-02-11 NOTE — Transfer of Care (Signed)
Immediate Anesthesia Transfer of Care Note  Patient: Anthony Lamb  Procedure(s) Performed: OPEN REDUCTION INTERNAL FIXATION (ORIF) DISTAL RADIAL FRACTURE (Right Wrist)  Patient Location: PACU  Anesthesia Type:MAC combined with regional for post-op pain  Level of Consciousness: awake, alert  and oriented  Airway & Oxygen Therapy: Patient connected to nasal cannula oxygen  Post-op Assessment: Report given to RN and Post -op Vital signs reviewed and stable  Post vital signs: Reviewed and stable  Last Vitals:  Vitals Value Taken Time  BP    Temp    Pulse 68 02/11/2018  9:01 PM  Resp 27 02/11/2018  9:01 PM  SpO2 99 % 02/11/2018  9:01 PM  Vitals shown include unvalidated device data.  Last Pain:  Vitals:   02/11/18 1526  TempSrc:   PainSc: 9       Patients Stated Pain Goal: 4 ([id number])  Complications: No apparent anesthesia complications

## 2018-02-11 NOTE — Progress Notes (Signed)
Spoke with Dr. Sarajane Jews yesterday, who indicated that patient would likely be medically prepared to undergo ORIF R DRFx today.  On Friday, when his operation was cancelled, we re-scheduled it for 5pm today. Will plan to proceed today as scheduled, and patient will need to be NPO after 9am.  He could conceivably be d/c from hospital following surgery, as it would generally be done as an outpatient in other situations.  Micheline Rough, MD Hand Surgery

## 2018-02-11 NOTE — Discharge Instructions (Signed)
Discharge Instructions   You have a dressing with a plaster splint incorporated in it. Move your fingers as much as possible, making a full fist and fully opening the fist. Elevate your hand to reduce pain & swelling of the digits.  Ice over the operative site may be helpful to reduce pain & swelling.  DO NOT USE HEAT. Leave the dressing in place until you return to our office.  You may shower, but keep the bandage clean & dry.  You may drive a car when you are off of prescription pain medications and can safely control your vehicle with both hands.   Please call [phone] during normal business hours or [phone] after hours for any problems. Including the following:  - excessive redness of the incisions - drainage for more than 4 days - fever of more than 101.5 F  *Please note that pain medications will not be refilled after hours or on weekends.

## 2018-02-12 ENCOUNTER — Encounter (HOSPITAL_COMMUNITY): Payer: Self-pay | Admitting: Orthopedic Surgery

## 2018-02-12 DIAGNOSIS — F419 Anxiety disorder, unspecified: Secondary | ICD-10-CM

## 2018-02-12 DIAGNOSIS — F13230 Sedative, hypnotic or anxiolytic dependence with withdrawal, uncomplicated: Secondary | ICD-10-CM

## 2018-02-12 DIAGNOSIS — F1310 Sedative, hypnotic or anxiolytic abuse, uncomplicated: Secondary | ICD-10-CM

## 2018-02-12 MED ORDER — FOLIC ACID 1 MG PO TABS: 1.0000 mg | ORAL_TABLET | Freq: Every day | ORAL | Status: DC

## 2018-02-12 MED ORDER — ENSURE ENLIVE PO LIQD: 237.0000 mL | Freq: Three times a day (TID) | ORAL | 0 refills | Status: DC

## 2018-02-12 MED ORDER — LORAZEPAM 0.5 MG PO TABS
0.5000 mg | ORAL_TABLET | Freq: Once | ORAL | Status: AC
  Administered 2018-02-12: 0.5 mg via ORAL
  Filled 2018-02-12: qty 1

## 2018-02-12 MED ORDER — THIAMINE HCL 100 MG PO TABS: 100.0000 mg | ORAL_TABLET | Freq: Every day | ORAL | Status: DC

## 2018-02-12 NOTE — Progress Notes (Signed)
Pt discharged to home in stable condition, all discharge instructions and rx's x 2 reviewed with and given to pt.  Pt and belongings transported via wheelchair with transporter x 1 at chairside.  [id number]

## 2018-02-12 NOTE — Discharge Summary (Signed)
Physician Discharge Summary  Anthony Lamb DXA:[id number] DOB: [birth year] DOA: 02/08/2018  PCP: Sandi Mariscal, MD  Admit date: 02/08/2018 Discharge date: 02/12/2018  Recommendations for Outpatient Follow-up:  1. Alcohol abuse, benzodiazepine abuse, anxiety, depression 2. Follow-up operative fixation of right distal radius fracture  Drug database reviewed, received prescription 8/21 for Ativan 1 mg #90.  Has not received any narcotics in the last 90 days.  Follow-up Information    Schedule an appointment as soon as possible for a visit with Anthony Jakob, MD.   Specialty:  Orthopedic Surgery Why:  for 10-15 days following surgery Contact information: [place] [address] [phone]        Sandi Mariscal, MD. Schedule an appointment as soon as possible for a visit in 1 week(s).   Specialty:  Internal Medicine Contact information: [place] [address] [phone]            Discharge Diagnoses:  1. Alcohol withdrawal, DTs. 2. Alcohol abuse  3. Benzodiazepine dependence, abuse with acute withdrawal. 4. Anxiety, depression 5. Closed right distal radius fracture followed by orthopedics as an outpatient.  Discharge Condition: improved Disposition: home  Diet recommendation: regular  Filed Weights   02/07/18 0958 02/08/18 1049 02/11/18 1648  Weight: 79.4 kg 79.7 kg 79.7 kg    History of present illness:  62 year old man PMH ongoing alcohol abuse, chronic benzodiazepine use daily, anxiety, depression, fell and sustained a right wrist fracture proximally week ago, plans were for ORIF 9/6 but he was noted to have symptoms concerning for alcohol and benzodiazepine withdrawal and therefore surgery was canceled and patient was sent to the emergency department.  Abruptly stopped drinking 48 hours prior to admission.  Ran out of Ativan which she takes 1 mg 3 times daily 48 hours prior to admission.  Noted to be hallucinating in the emergency  department.  Admitted for alcohol withdrawal  Hospital Course:  Patient was treated for acute alcohol withdrawal without complication.  Condition has stabilized.  Seen by orthopedics and underwent operative fixation of his right distal radius fracture.  He appears stable for discharge at this time.  He needs to follow-up with his PCP for further management of anxiety.  Ativan to be prescribed only by his PCP.  Alcohol withdrawal, DTs. --Resolved.  Alcohol abuse  --Social work to provide resources  Benzodiazepine dependence, abuse with acute withdrawal.  Takes Ativan 1 mg 3 times daily. --Resolved.  Patient to contact PCP in regard to Ativan use.  Anxiety, depression --Recommend close outpatient follow-up, consider alternative to Ativan.  Closed right distal radius fracture followed by orthopedics as an outpatient. --Status post operative fixation 9/9 --Return to see Dr. Grandville Silos as an outpatient in 10-15 days for new x-rays   Social.  Reportedly being evicted from his apartment. --CSW consult   Consultants:  Orthopedics   Procedures: PROCEDURE: ORIF right displaced distal radius fracture, extra-articular, 25607  Today's assessment: S: feels anxious, going to group home after discharge; poor appetite O: Vitals:  Vitals:   02/11/18 2150 02/12/18 0319  BP: (!) 169/84 124/74  Pulse: (!) 59 60  Resp:    Temp: 98.2 F (36.8 C) 98.8 F (37.1 C)  SpO2: 98% 96%    Constitutional:  . Appears calm and comfortable Respiratory:  . CTA bilaterally, no w/r/r.  . Respiratory effort normal.  Cardiovascular:  . RRR, no m/r/g Musculoskeletal:  . Right forearm in cast Psychiatric:  . Mental status o Mood, affect appropriate   Discharge Instructions  Discharge Instructions    Diet general   Complete by:  As directed    Discharge instructions   Complete by:  As directed    Call your physician or seek immediate medical attention for pain, weakness or pain in hand,  swelling of the hand, decreased sensation in hand, fever, confusion, hallucinations or worsening of condition.  Do not drink alcohol. Talk to your doctor about use of Ativan.  Drinking alcohol, taking prescription pain medication, taking Ativan can be a deadly combination and the combination should be avoided.   Increase activity slowly   Complete by:  As directed      Allergies as of 02/12/2018      Reactions   Diphenhydramine Hcl Other (See Comments)   Restlessness   Flexeril [cyclobenzaprine] Other (See Comments)   Restlessness   Hctz [hydrochlorothiazide] Other (See Comments)   Caused to lose sodium when taking with Lisinopril   Nsaids Other (See Comments)   GI upset      Medication List    TAKE these medications   acetaminophen 325 MG tablet Commonly known as:  TYLENOL Take 2 tablets (650 mg total) by mouth every 6 (six) hours as needed for mild pain (or Fever >/= 101). What changed:    medication strength  how much to take  when to take this  reasons to take this   feeding supplement (ENSURE ENLIVE) Liqd Take 237 mLs by mouth 3 (three) times daily between meals.   folic acid 1 MG tablet Commonly known as:  FOLVITE Take 1 tablet (1 mg total) by mouth daily.   lisinopril 10 MG tablet Commonly known as:  PRINIVIL,ZESTRIL Take 1 tablet (10 mg total) by mouth daily. What changed:  when to take this   LORazepam 1 MG tablet Commonly known as:  ATIVAN Take 1 mg by mouth 3 (three) times daily as needed for anxiety.   multivitamin with minerals Tabs tablet Take 1 tablet by mouth daily.   oxyCODONE 5 MG immediate release tablet Commonly known as:  Oxy IR/ROXICODONE Take 0-1 tablets (0-5 mg total) by mouth every 6 (six) hours as needed for moderate pain or severe pain (postop pain).   pantoprazole 40 MG tablet Commonly known as:  PROTONIX Take 1 tablet (40 mg total) by mouth daily.   sodium chloride 0.65 % Soln nasal spray Commonly known as:  OCEAN Place 1-2  sprays into both nostrils as needed for congestion.   sucralfate 1 g tablet Commonly known as:  CARAFATE Take 1 tablet (1 g total) by mouth 4 (four) times daily -  with meals and at bedtime. Chew before swallowing   thiamine 100 MG tablet Take 1 tablet (100 mg total) by mouth daily.   verapamil 240 MG CR tablet Commonly known as:  CALAN-SR Take 240 mg by mouth at bedtime.   zolpidem 10 MG tablet Commonly known as:  AMBIEN Take 10 mg by mouth at bedtime as needed for sleep.      Allergies  Allergen Reactions  . Diphenhydramine Hcl Other (See Comments)    Restlessness  . Flexeril [Cyclobenzaprine] Other (See Comments)    Restlessness  . Hctz [Hydrochlorothiazide] Other (See Comments)    Caused to lose sodium when taking with Lisinopril  . Nsaids Other (See Comments)    GI upset    The results of significant diagnostics from this hospitalization (including imaging, microbiology, ancillary and laboratory) are listed below for reference.    Significant Diagnostic Studies: Dg Wrist 2 Views Right  Result Date: 02/05/2018 CLINICAL DATA:  Post reduction right wrist fracture EXAM: RIGHT WRIST - 2 VIEW COMPARISON:  Right wrist films of 02/05/2009 FINDINGS: There is very little change in the slightly angulated transverse comminuted fracture of the distal right radius. Fracture of the ulnar styloid is noted as well. Deep bony detail is somewhat obscured by overlying cast material IMPRESSION: Little change in position of the transverse comminuted slightly angulated fracture of the distal right radius. No change in ulnar styloid fracture. Electronically Signed   By: Ivar Drape M.D.   On: 02/05/2018 11:50   Dg Wrist Complete Right  Result Date: 02/11/2018 CLINICAL DATA:  Distal radius fracture EXAM: DG C-ARM 61-120 MIN; RIGHT WRIST - COMPLETE 3+ VIEW COMPARISON:  02/05/2018 FINDINGS: 3 low resolution spot views of the right wrist. Total fluoroscopy time was 24 seconds. Images demonstrate plate  and screw fixation of distal radius fracture. Displaced ulnar styloid process fracture. IMPRESSION: Intraoperative fluoroscopic assistance provided during surgical fixation of distal radius fracture Electronically Signed   By: Donavan Foil M.D.   On: 02/11/2018 22:05   Dg Wrist Complete Right  Result Date: 02/05/2018 CLINICAL DATA:  Fall.  Right wrist injury. EXAM: RIGHT WRIST - COMPLETE 3+ VIEW COMPARISON:  No recent prior. FINDINGS: Comminuted angulated fracture of the distal right radius is present. Fracture may extend into the radiocarpal joint space. Displaced fracture of the ulnar styloid noted. No other focal abnormality. IMPRESSION: Comminuted angulated fracture of the distal right radius. Fracture may extend into the radiocarpal joint space. Displaced fracture of the ulnar styloid. Electronically Signed   By: Marcello Moores  Register   On: 02/05/2018 09:41   Dg C-arm 1-60 Min  Result Date: 02/11/2018 CLINICAL DATA:  Distal radius fracture EXAM: DG C-ARM 61-120 MIN; RIGHT WRIST - COMPLETE 3+ VIEW COMPARISON:  02/05/2018 FINDINGS: 3 low resolution spot views of the right wrist. Total fluoroscopy time was 24 seconds. Images demonstrate plate and screw fixation of distal radius fracture. Displaced ulnar styloid process fracture. IMPRESSION: Intraoperative fluoroscopic assistance provided during surgical fixation of distal radius fracture Electronically Signed   By: Donavan Foil M.D.   On: 02/11/2018 22:05    Microbiology: Recent Results (from the past 240 hour(s))  Surgical PCR screen     Status: None   Collection Time: 02/10/18  5:39 PM  Result Value Ref Range Status   MRSA, PCR NEGATIVE NEGATIVE Final   Staphylococcus aureus NEGATIVE NEGATIVE Final    Comment: (NOTE) The Xpert SA Assay (FDA approved for NASAL specimens in patients 36 years of age and older), is one component of a comprehensive surveillance program. It is not intended to diagnose infection nor to guide or monitor  treatment. Performed at [facility], [place] [address] [place],  [address]      Labs: Basic Metabolic Panel: Recent Labs  Lab 02/08/18 1233 02/09/18 0418  NA 133* 132*  K 3.7 3.4*  CL 97* 98  CO2 25 24  GLUCOSE 105* 95  BUN <5* <5*  CREATININE 0.72 0.66  CALCIUM 9.1 8.5*   Liver Function Tests: Recent Labs  Lab 02/08/18 1233 02/09/18 0418  AST 21 18  ALT 17 16  ALKPHOS 72 68  BILITOT 0.9 0.8  PROT 6.7 6.2*  ALBUMIN 3.6 3.2*    Recent Labs  Lab 02/08/18 1426  AMMONIA 65*   CBC: Recent Labs  Lab 02/08/18 1233  WBC 11.2*  HGB 13.9  HCT 41.4  MCV 89.8  PLT 312  Principal Problem:   Alcohol withdrawal ([place]) Active Problems:   Essential hypertension   Dementia   Gastroesophageal reflux disease   Alcohol abuse   Adjustment disorder with mixed anxiety and depressed mood   Hypertension   Time coordinating discharge: 35 minutes  Signed:  [name], MD [facility] 02/12/2018, 10:08 AM

## 2018-02-13 ENCOUNTER — Encounter (HOSPITAL_COMMUNITY): Payer: Self-pay | Admitting: Orthopedic Surgery

## 2018-02-13 NOTE — Anesthesia Postprocedure Evaluation (Signed)
Anesthesia Post Note  Patient: Anthony Lamb  Procedure(s) Performed: OPEN REDUCTION INTERNAL FIXATION (ORIF) DISTAL RADIAL FRACTURE (Right Wrist)     Patient location during evaluation: PACU Anesthesia Type: Regional Level of consciousness: awake and alert Pain management: pain level controlled Vital Signs Assessment: post-procedure vital signs reviewed and stable Respiratory status: spontaneous breathing Cardiovascular status: stable Anesthetic complications: no    Last Vitals:  Vitals:   02/11/18 2150 02/12/18 0319  BP: (!) 169/84 124/74  Pulse: (!) 59 60  Resp:    Temp: 36.8 C 37.1 C  SpO2: 98% 96%    Last Pain:  Vitals:   02/12/18 0800  TempSrc:   PainSc: [place]

## 2018-02-14 ENCOUNTER — Encounter (HOSPITAL_COMMUNITY): Payer: Self-pay

## 2018-02-14 ENCOUNTER — Emergency Department (HOSPITAL_COMMUNITY): Payer: Medicaid Other

## 2018-02-14 ENCOUNTER — Other Ambulatory Visit: Payer: Self-pay

## 2018-02-14 ENCOUNTER — Emergency Department (HOSPITAL_COMMUNITY)
Admission: EM | Admit: 2018-02-14 | Discharge: 2018-02-15 | Disposition: A | Discharge status: Home / Self Care | Payer: Medicaid Other | Attending: Emergency Medicine | Admitting: Emergency Medicine

## 2018-02-14 DIAGNOSIS — I10 Essential (primary) hypertension: Secondary | ICD-10-CM | POA: Insufficient documentation

## 2018-02-14 DIAGNOSIS — F419 Anxiety disorder, unspecified: Secondary | ICD-10-CM | POA: Insufficient documentation

## 2018-02-14 DIAGNOSIS — R21 Rash and other nonspecific skin eruption: Secondary | ICD-10-CM | POA: Insufficient documentation

## 2018-02-14 DIAGNOSIS — Z87891 Personal history of nicotine dependence: Secondary | ICD-10-CM | POA: Insufficient documentation

## 2018-02-14 DIAGNOSIS — R0789 Other chest pain: Secondary | ICD-10-CM | POA: Insufficient documentation

## 2018-02-14 DIAGNOSIS — R079 Chest pain, unspecified: Secondary | ICD-10-CM | POA: Diagnosis present

## 2018-02-14 LAB — BASIC METABOLIC PANEL
Anion gap: 9 (ref 5–15)
CO2: 29 mmol/L (ref 22–32)
Calcium: 9.1 mg/dL (ref 8.9–10.3)
Chloride: 91 mmol/L — ABNORMAL LOW (ref 98–111)
Creatinine, Ser: 0.71 mg/dL (ref 0.61–1.24)
GFR calc Af Amer: >60 mL/min (ref >60)
GLUCOSE: 102 mg/dL — AB (ref 70–99)
POTASSIUM: 4.8 mmol/L (ref 3.5–5.1)
Sodium: 129 mmol/L — ABNORMAL LOW (ref 135–145)

## 2018-02-14 LAB — CBC
HEMATOCRIT: 41 % (ref 39.0–52.0)
HEMOGLOBIN: 13.5 g/dL (ref 13.0–17.0)
MCH: 30.1 pg (ref 26.0–34.0)
MCHC: 32.9 g/dL (ref 30.0–36.0)
MCV: 91.3 fL (ref 78.0–100.0)
Platelets: 343 10*3/uL (ref 150–400)
RBC: 4.49 MIL/uL (ref 4.22–5.81)
RDW: 12.4 % (ref 11.5–15.5)
WBC: 9.3 10*3/uL (ref 4.0–10.5)

## 2018-02-14 LAB — I-STAT TROPONIN, ED
Troponin i, poc: 0.02 ng/mL (ref 0.00–0.08)
Troponin i, poc: 0.01 ng/mL (ref 0.00–0.08)

## 2018-02-14 MED ORDER — CHLORDIAZEPOXIDE HCL 25 MG PO CAPS: ORAL_CAPSULE | ORAL | 0 refills | Status: DC

## 2018-02-14 MED ORDER — HYDROCORTISONE 1 % EX CREA: TOPICAL_CREAM | CUTANEOUS | 0 refills | Status: DC

## 2018-02-14 MED ORDER — LORAZEPAM 2 MG/ML IJ SOLN
1.0000 mg | Freq: Once | INTRAMUSCULAR | Status: AC
  Administered 2018-02-14: 1 mg via INTRAVENOUS
  Filled 2018-02-14: qty 1

## 2018-02-14 MED ORDER — HYDROXYZINE HCL 25 MG PO TABS: 50.0000 mg | ORAL_TABLET | Freq: Once | ORAL | Status: DC

## 2018-02-14 MED ORDER — IOPAMIDOL (ISOVUE-370) INJECTION 76%
INTRAVENOUS | Status: AC
  Filled 2018-02-14: qty 100

## 2018-02-14 MED ORDER — IOPAMIDOL (ISOVUE-370) INJECTION 76%
100.0000 mL | Freq: Once | INTRAVENOUS | Status: AC | PRN
  Administered 2018-02-14: 100 mL via INTRAVENOUS

## 2018-02-14 MED ORDER — LISINOPRIL 10 MG PO TABS
10.0000 mg | ORAL_TABLET | Freq: Once | ORAL | Status: AC
  Administered 2018-02-14: 10 mg via ORAL
  Filled 2018-02-14: qty 1

## 2018-02-14 MED ORDER — SODIUM CHLORIDE 0.9 % IV BOLUS
1000.0000 mL | Freq: Once | INTRAVENOUS | Status: AC
  Administered 2018-02-14: 1000 mL via INTRAVENOUS

## 2018-02-14 MED ORDER — VERAPAMIL HCL ER 240 MG PO TBCR
240.0000 mg | EXTENDED_RELEASE_TABLET | Freq: Once | ORAL | Status: AC
  Administered 2018-02-15: 240 mg via ORAL
  Filled 2018-02-14: qty 1

## 2018-02-14 NOTE — ED Notes (Signed)
Spoke with pt who describes feeling overwhelmed due to his "problems".  Pt states that he is being evicted tomorrow and has nowhere to go.  He states that he is so worried and anxious that he "doesn't know what to do".  Pt attributes the chest pain to his anxiety over his significant social problems right now.

## 2018-02-14 NOTE — ED Provider Notes (Signed)
[facility] DEPART[facility] Provider Note   CSN: [id number] Arrival date & time: 02/14/18  1207     History   Chief Complaint Chief Complaint  Patient presents with  . Chest Pain  . Anxiety    HPI Anthony Lamb is a 62 y.o. male with history of alcohol abuse, chronic diarrhea secondary to ulcerative colitis and IBS, GERD, anxiety and depression, and SIADH presents for evaluation of acute onset, intermittent chest pains since last night.  Pain is substernal, described as a pressure, does not radiate.  He notes some associated shortness of breath.  He denies diaphoresis, lightheadedness, nausea, vomiting, or syncope.  He is 3 days status post right ORIF for repair of displaced right distal radius fracture.  He notes he is also having pain to the right wrist.  Has been taking the pain medicines prescribed to him without relief.  Also notes a rash to the right upper extremity which has been spreading.  He notes a burning and itching sensation to the rash.  Denies any new soaps, shampoos, detergents, lotions, or time spent outside.  Notes subjective fevers and chills.  He thinks that his symptoms are related to anxiety and he feels very anxious primarily due to the fact that he is being evicted from his current home tomorrow.  States he does have "people involved who are trying to find me an alternate living situation ".  He did receive 1 sublingual nitroglycerin without relief of his symptoms.  He is a former smoker, denies recreational drug use, states that he last had alcohol 2 or 3 days ago.  States that prior to this he was drinking around 4 beers every evening.  The history is provided by the patient.    Past Medical History:  Diagnosis Date  . Alcohol abuse, in remission   . Anemia   . Ankylosing spondylitis ([place])   . Anxiety   . Chronic diarrhea   . Chronic pain   . Colitis   . Depression   . Esophagitis   . Gastritis   . GERD (gastroesophageal reflux  disease)   . Hypertension   . IBS (irritable bowel syndrome)   . Poor dentition   . SIADH (syndrome of inappropriate ADH production) [facility])     Patient Active Problem List   Diagnosis Date Noted  . Alcohol withdrawal ([place]) 02/08/2018  . IBS (irritable bowel syndrome)   . Hypertension   . Colitis   . Adjustment disorder with mixed anxiety and depressed mood 05/11/2017  . Alcohol abuse   . Anxiety   . Gastroesophageal reflux disease   . Osteopenia determined by x-ray 08/06/2014  . Stress fracture of calcaneus 08/06/2014  . Vitamin D deficiency 12/18/2013  . Dementia 12/18/2013  . Benign microscopic hematuria 07/06/2013  . SIADH (syndrome of inappropriate ADH production) ([place]) 06/22/2013  . Ankylosing spondylitis ([place]) 06/20/2013  . Malnutrition of moderate degree ([place]) 06/20/2013  . BPH (benign prostatic hyperplasia) 08/22/2010  . History of colonic polyps 08/13/2008  . Essential hypertension 07/03/2007  . PUD (peptic ulcer disease) 07/03/2007    Past Surgical History:  Procedure Laterality Date  . COLONOSCOPY W/ BIOPSIES    . ESOPHAGOGASTRODUODENOSCOPY    . HERNIA REPAIR Bilateral   . OPEN REDUCTION INTERNAL FIXATION (ORIF) DISTAL RADIAL FRACTURE Right 02/11/2018   Procedure: OPEN REDUCTION INTERNAL FIXATION (ORIF) DISTAL RADIAL FRACTURE;  Surgeon: Milly Jakob, MD;  Location: [facility];  Service: Orthopedics;  Laterality: Right;  . TONSILLECTOMY  Home Medications    Prior to Admission medications   Medication Sig Start Date End Date Taking? Authorizing Provider  acetaminophen (TYLENOL) 325 MG tablet Take 2 tablets (650 mg total) by mouth every 6 (six) hours as needed for mild pain (or Fever >/= 101). 02/11/18  Yes Milly Jakob, MD  LORazepam (ATIVAN) 1 MG tablet Take 1 mg by mouth 3 (three) times daily as needed for anxiety.   Yes [provider]  Multiple Vitamin (MULTIVITAMIN WITH MINERALS) TABS tablet Take 1 tablet by mouth daily.   Yes [provider]  oxyCODONE (OXY IR/ROXICODONE) 5 MG immediate release tablet Take 0-1 tablets (0-5 mg total) by mouth every 6 (six) hours as needed for moderate pain or severe pain (postop pain). 02/11/18  Yes Milly Jakob, MD  pantoprazole (PROTONIX) 40 MG tablet Take 1 tablet (40 mg total) by mouth daily. 04/24/14  Yes Janith Lima, MD  sodium chloride (OCEAN) 0.65 % SOLN nasal spray Place 1-2 sprays into both nostrils as needed for congestion.    Yes [provider]  sucralfate (CARAFATE) 1 G tablet Take 1 tablet (1 g total) by mouth 4 (four) times daily -  with meals and at bedtime. Chew before swallowing 12/17/13  Yes Janith Lima, MD  verapamil (CALAN-SR) 240 MG CR tablet Take 240 mg by mouth at bedtime. 02/09/17  Yes [provider]  zolpidem (AMBIEN) 10 MG tablet Take 10 mg by mouth at bedtime as needed for sleep.   Yes [provider]  chlordiazePOXIDE (LIBRIUM) 25 MG capsule 54m PO TID x 1D, then 25-537mPO BID X 1D, then 25-5075mO QD X 1D 02/14/18   Adeana Grilliot A, PA-C  feeding supplement, ENSURE ENLIVE, (ENSURE ENLIVE) LIQD Take 237 mLs by mouth 3 (three) times daily between meals. Patient not taking: Reported on 02/14/2018 02/12/18   GooSamuella CotaD  folic acid (FOLVITE) 1 MG tablet Take 1 tablet (1 mg total) by mouth daily. Patient not taking: Reported on 02/14/2018 02/12/18   GooSamuella CotaD  hydrocortisone cream 1 % Apply to affected area 2 times daily 02/14/18   FawRodell Perna PA-C  lisinopril (PRINIVIL,ZESTRIL) 10 MG tablet Take 1 tablet (10 mg total) by mouth daily. Patient taking differently: Take 10 mg by mouth at bedtime.  08/21/17 02/08/18  LacMelanee SpryD  thiamine 100 MG tablet Take 1 tablet (100 mg total) by mouth daily. Patient not taking: Reported on 02/14/2018 02/12/18   GooSamuella CotaD    Family History Family History  Problem Relation Age of Onset  . Anxiety disorder Mother   . Congestive Heart Failure Mother   .  Crohn's disease Mother   . Colon cancer Mother 60 33 Arthritis Father   . High blood pressure Father     Social History Social History   Tobacco Use  . Smoking status: Former Smoker    Types: Cigarettes    Last attempt to quit: 2014    Years since quitting: 5.7  . Smokeless tobacco: Never Used  Substance Use Topics  . Alcohol use: Yes    Comment: daily/ 3-4 beers, has not has any in a few days  . Drug use: No     Allergies   Diphenhydramine hcl; Flexeril [cyclobenzaprine]; Hctz [hydrochlorothiazide]; and Nsaids   Review of Systems Review of Systems  Constitutional: Negative for chills and fever.  Respiratory: Positive for shortness of breath.   Cardiovascular: Positive for chest pain.  Gastrointestinal: Positive for abdominal pain (chronic, unchanged).  Musculoskeletal: Positive for arthralgias.  Skin: Positive for rash.  Psychiatric/Behavioral: The patient is nervous/anxious.   All other systems reviewed and are negative.    Physical Exam Updated Vital Signs BP (!) 169/84   Pulse 81   Temp 97.8 F (36.6 C) (Oral)   Resp 19   Ht 5' 11"  (1.803 m)   Wt 79.7 kg   SpO2 97%   BMI 24.51 kg/m   Physical Exam  Constitutional: He appears well-developed and well-nourished. No distress.  HENT:  Head: Normocephalic and atraumatic.  Eyes: Conjunctivae are normal. Right eye exhibits no discharge. Left eye exhibits no discharge.  Neck: Normal range of motion. Neck supple. No JVD present. No tracheal deviation present.  Cardiovascular: Normal rate and regular rhythm.  Pulses:      Carotid pulses are 2+ on the right side, and 2+ on the left side.      Radial pulses are 2+ on the right side, and 2+ on the left side.       Dorsalis pedis pulses are 2+ on the right side, and 2+ on the left side.       Posterior tibial pulses are 2+ on the right side, and 2+ on the left side.  2+ radial and DP/PT pulses bilaterally, Homans sign absent bilaterally, no lower extremity edema,  no palpable cords, compartments are soft   Pulmonary/Chest: Effort normal and breath sounds normal. No respiratory distress.  Abdominal: Soft. Bowel sounds are normal. He exhibits no distension and no mass. There is tenderness. There is no rebound and no guarding.  Diffuse generalized tenderness to palpation.  Patient states this is chronic and unchanged.  Musculoskeletal: He exhibits no edema.       Right lower leg: Normal. He exhibits no tenderness and no edema.       Left lower leg: Normal. He exhibits no tenderness and no edema.  Splint noted to the right upper extremity.  Digits do not appear swollen, normal range of motion.  Sensation intact to soft touch.  Good grip strength bilaterally.  Neurological: He is alert.  Skin: Skin is warm and dry. Rash noted. No erythema.  See below images.  There is an erythematous rash with irregular borders noted to the right upper extremity overlying splint.  Ecchymosis in various stages of healing noted to the right upper extremity.  Psychiatric: He has a normal mood and affect. His behavior is normal.  Nursing note and vitals reviewed.        ED Treatments / Results  Labs (all labs ordered are listed, but only abnormal results are displayed) Labs Reviewed  BASIC METABOLIC PANEL - Abnormal; Notable for the following components:      Result Value   Sodium 129 (*)    Chloride 91 (*)    Glucose, Bld 102 (*)    BUN <5 (*)    All other components within normal limits  CBC  I-STAT TROPONIN, ED  I-STAT TROPONIN, ED    EKG ED ECG REPORT   Date: 02/14/2018  Rate: 79  Rhythm: normal sinus rhythm  QRS Axis: normal  Intervals: normal  ST/T Wave abnormalities: normal  Conduction Disutrbances:none  Narrative Interpretation:   Old EKG Reviewed: unchanged, though more artifact noted.   I have personally reviewed the EKG tracing and agree with the computerized printout as noted.   Radiology Dg Chest 2 View  Result Date:  02/14/2018 CLINICAL DATA:  Chest pain EXAM: CHEST -  2 VIEW COMPARISON:  12/2017 FINDINGS: Cardiomediastinal silhouette is normal. Mediastinal contours appear intact. There is no evidence of focal airspace consolidation, pleural effusion or pneumothorax. Kyphosis with ankylosing changes of the thorax spine. Soft tissues are grossly normal. IMPRESSION: No active cardiopulmonary disease. Electronically Signed   By: Fidela Salisbury M.D.   On: 02/14/2018 12:51   Ct Angio Chest Pe W And/or Wo Contrast  Result Date: 02/14/2018 CLINICAL DATA:  Nonradiating chest pain/pressure for 2 days, anxiety, hypertension, GERD, irritable bowel syndrome, former smoker EXAM: CT ANGIOGRAPHY CHEST WITH CONTRAST TECHNIQUE: Multidetector CT imaging of the chest was performed using the standard protocol during bolus administration of intravenous contrast. Multiplanar CT image reconstructions and MIPs were obtained to evaluate the vascular anatomy. CONTRAST:  151m ISOVUE-370 IOPAMIDOL (ISOVUE-370) INJECTION 76% IV COMPARISON:  None FINDINGS: Cardiovascular: Aorta normal caliber without aneurysm or dissection. Atherosclerotic calcifications of aorta, proximal great vessels and coronary arteries. Pulmonary arteries well opacified and patent. No evidence of pulmonary embolism. Mediastinum/Nodes: Esophagus unremarkable. Base of cervical region normal appearance. No thoracic adenopathy. Lungs/Pleura: Minimal dependent atelectasis in the posterior lungs. Lungs otherwise clear. No infiltrate, pleural effusion, or pneumothorax. Upper Abdomen: Streak artifacts in the upper abdomen from patient's arm. No acute upper abdominal abnormalities. Musculoskeletal: Calcification of anterior longitudinal ligament of the thoracic spine. Thoracic ankylosis with upper thoracic kyphosis. Review of the MIP images confirms the above findings. IMPRESSION: No evidence of pulmonary embolism. Minimal atelectasis. Scattered atherosclerotic calcifications including  coronary arteries. 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 Aortic Atherosclerosis (ICD10-I70.0). Electronically Signed   By: MLavonia DanaM.D.   On: 02/14/2018 23:24    Procedures Procedures (including critical care time)  Medications Ordered in ED Medications  iopamidol (ISOVUE-370) 76 % injection (has no administration in time range)  sodium chloride 0.9 % bolus 1,000 mL (0 mLs Intravenous Stopped 02/14/18 2135)  LORazepam (ATIVAN) injection 1 mg (1 mg Intravenous Given 02/14/18 1833)  LORazepam (ATIVAN) injection 1 mg (1 mg Intravenous Given 02/14/18 2222)  lisinopril (PRINIVIL,ZESTRIL) tablet 10 mg (10 mg Oral Given 02/14/18 2335)  verapamil (CALAN-SR) CR tablet 240 mg (240 mg Oral Given 02/15/18 0026)  iopamidol (ISOVUE-370) 76 % injection 100 mL (100 mLs Intravenous Contrast Given 02/14/18 2254)     Initial Impression / Assessment and Plan / ED Course  I have reviewed the triage vital signs and the nursing notes.  Pertinent labs & imaging results that were available during my care of the patient were reviewed by me and considered in my medical decision making (see chart for details).     Patient well-known to this ED presents for evaluation of chest pain since yesterday.  He is afebrile, hypertensive in the ED but states he has not had his evening hypertension medicines.  He does have a rash to the right upper arm where he underwent ORIF of distal radius fracture 3 days ago.  Rash appears consistent with contact dermatitis versus urticaria.  No signs of anaphylaxis, tolerating secretions without difficulty.  Airway appears patent.  No evidence of emergent dermatologic condition such as SKatherina Rightsyndrome, TENS, syphilis, recommend spot fever.  Recommend topical corticosteroid cream as needed for itching.  Chest x-ray shows no active cardiopulmonary disease.  EKG shows no acute changes, no ischemic changes.  Serial troponins are negative.  No evidence of ACS/MI.  CTA was obtained  to rule out PE status post recent surgery.  CTA shows no evidence of acute PE, has minimal atelectasis and atherosclerotic calcifications.  No evidence  of dissection, cardiac tamponade on, esophageal rupture, pneumonia.  On reevaluation patient is resting comfortably in no apparent distress.  He is still anxious regarding his living situation but denies any chest pain.  He is tolerating p.o. food and fluids in the ED without difficulty.  Stable for discharge home with follow-up with his PCP for reevaluation of symptoms and for refills of his Ativan.  Discussed strict ED return precautions. Pt verbalized understanding of and agreement with plan and is safe for discharge home at this time. Discussed with Dr. Alvino Chapel who agrees with assessment and plan at this time.  Final Clinical Impressions(s) / ED Diagnoses   Final diagnoses:  Atypical chest pain  Rash    ED Discharge Orders         Ordered    chlordiazePOXIDE (LIBRIUM) 25 MG capsule     02/14/18 2340    hydrocortisone cream 1 %     02/14/18 2340           Jahad Old, [place] A, PA-C 02/15/18 0109    Davonna Belling, MD 02/18/18 1552

## 2018-02-14 NOTE — ED Triage Notes (Signed)
Pt endorses nonradiating chest pressure x 2 days with anxiety due to getting evicted in 2 days. Pt states "I think my chest pain is from my anxiety" Not taking any medications. VSS. Denies ASA due to stomach ulcers, given 1 nitro which helped the pt calm down.

## 2018-02-14 NOTE — Discharge Instructions (Addendum)
Take your home medicines as prescribed.  Apply hydrocortisone cream to rash twice daily as needed for itching.  Keep the area clean and dry.  Do not drink any alcohol while you are taking the Librium taper.  Follow-up with primary care or cardiology for reevaluation of your symptoms.  Return to the emergency department if any concerning signs or symptoms develop such as fevers, worsening spread of redness, shortness of breath, or chest pain.

## 2018-02-21 ENCOUNTER — Encounter (HOSPITAL_COMMUNITY): Payer: Self-pay | Admitting: Emergency Medicine

## 2018-02-21 ENCOUNTER — Emergency Department (HOSPITAL_COMMUNITY)
Admission: EM | Admit: 2018-02-21 | Discharge: 2018-02-21 | Disposition: A | Discharge status: Home / Self Care | Payer: Medicaid Other | Attending: Emergency Medicine | Admitting: Emergency Medicine

## 2018-02-21 DIAGNOSIS — R51 Headache: Secondary | ICD-10-CM | POA: Diagnosis not present

## 2018-02-21 DIAGNOSIS — K029 Dental caries, unspecified: Secondary | ICD-10-CM | POA: Insufficient documentation

## 2018-02-21 DIAGNOSIS — Z79899 Other long term (current) drug therapy: Secondary | ICD-10-CM | POA: Insufficient documentation

## 2018-02-21 DIAGNOSIS — F0390 Unspecified dementia without behavioral disturbance: Secondary | ICD-10-CM | POA: Insufficient documentation

## 2018-02-21 DIAGNOSIS — I10 Essential (primary) hypertension: Secondary | ICD-10-CM | POA: Insufficient documentation

## 2018-02-21 DIAGNOSIS — Z87891 Personal history of nicotine dependence: Secondary | ICD-10-CM | POA: Insufficient documentation

## 2018-02-21 DIAGNOSIS — F1010 Alcohol abuse, uncomplicated: Secondary | ICD-10-CM | POA: Diagnosis not present

## 2018-02-21 DIAGNOSIS — K0889 Other specified disorders of teeth and supporting structures: Secondary | ICD-10-CM | POA: Diagnosis present

## 2018-02-21 LAB — COMPREHENSIVE METABOLIC PANEL
ALT: 14 U/L (ref 0–44)
AST: 21 U/L (ref 15–41)
Albumin: 3.8 g/dL (ref 3.5–5.0)
Alkaline Phosphatase: 87 U/L (ref 38–126)
Anion gap: 11 (ref 5–15)
BUN: 5 mg/dL — ABNORMAL LOW (ref 8–23)
CALCIUM: 9.1 mg/dL (ref 8.9–10.3)
CHLORIDE: 96 mmol/L — AB (ref 98–111)
CO2: 28 mmol/L (ref 22–32)
CREATININE: 0.64 mg/dL (ref 0.61–1.24)
Glucose, Bld: 104 mg/dL — ABNORMAL HIGH (ref 70–99)
Potassium: 3.8 mmol/L (ref 3.5–5.1)
SODIUM: 135 mmol/L (ref 135–145)
Total Bilirubin: 0.4 mg/dL (ref 0.3–1.2)
Total Protein: 7.2 g/dL (ref 6.5–8.1)

## 2018-02-21 LAB — CBC WITH DIFFERENTIAL/PLATELET
BASOS ABS: 0.1 10*3/uL (ref 0.0–0.1)
Basophils Relative: 0 %
EOS ABS: 0.1 10*3/uL (ref 0.0–0.7)
EOS PCT: 0 %
HCT: 41.6 % (ref 39.0–52.0)
HEMOGLOBIN: 14.3 g/dL (ref 13.0–17.0)
LYMPHS PCT: 10 %
Lymphs Abs: 1.1 10*3/uL (ref 0.7–4.0)
MCH: 30.9 pg (ref 26.0–34.0)
MCHC: 34.4 g/dL (ref 30.0–36.0)
MCV: 89.8 fL (ref 78.0–100.0)
Monocytes Absolute: 1 10*3/uL (ref 0.1–1.0)
Monocytes Relative: 8 %
NEUTROS PCT: 82 %
Neutro Abs: 9.7 10*3/uL — ABNORMAL HIGH (ref 1.7–7.7)
PLATELETS: 450 10*3/uL — AB (ref 150–400)
RBC: 4.63 MIL/uL (ref 4.22–5.81)
RDW: 13.1 % (ref 11.5–15.5)
WBC: 11.9 10*3/uL — AB (ref 4.0–10.5)

## 2018-02-21 LAB — URINALYSIS, ROUTINE W REFLEX MICROSCOPIC
BILIRUBIN URINE: NEGATIVE
Bacteria, UA: NONE SEEN
GLUCOSE, UA: NEGATIVE mg/dL
KETONES UR: NEGATIVE mg/dL
LEUKOCYTES UA: NEGATIVE
Nitrite: NEGATIVE
PH: 8 (ref 5.0–8.0)
Protein, ur: NEGATIVE mg/dL
Specific Gravity, Urine: 1.006 (ref 1.005–1.030)

## 2018-02-21 LAB — ETHANOL

## 2018-02-21 LAB — MAGNESIUM: Magnesium: 2 mg/dL (ref 1.7–2.4)

## 2018-02-21 MED ORDER — CHLORHEXIDINE GLUCONATE 0.12 % MT SOLN: 15.0000 mL | Freq: Two times a day (BID) | OROMUCOSAL | 0 refills | Status: DC

## 2018-02-21 MED ORDER — AMOXICILLIN 500 MG PO CAPS: 500.0000 mg | ORAL_CAPSULE | Freq: Three times a day (TID) | ORAL | 0 refills | Status: DC

## 2018-02-21 MED ORDER — AMOXICILLIN 500 MG PO CAPS
500.0000 mg | ORAL_CAPSULE | Freq: Once | ORAL | Status: AC
  Administered 2018-02-21: 500 mg via ORAL
  Filled 2018-02-21: qty 1

## 2018-02-21 MED ORDER — LORAZEPAM 2 MG/ML IJ SOLN
1.0000 mg | Freq: Once | INTRAMUSCULAR | Status: AC
  Administered 2018-02-21: 1 mg via INTRAVENOUS
  Filled 2018-02-21: qty 1

## 2018-02-21 MED ORDER — ONDANSETRON HCL 4 MG/2ML IJ SOLN
4.0000 mg | Freq: Once | INTRAMUSCULAR | Status: AC
  Administered 2018-02-21: 4 mg via INTRAVENOUS
  Filled 2018-02-21: qty 2

## 2018-02-21 MED ORDER — MORPHINE SULFATE (PF) 4 MG/ML IV SOLN
4.0000 mg | Freq: Once | INTRAVENOUS | Status: AC
  Administered 2018-02-21: 4 mg via INTRAVENOUS
  Filled 2018-02-21: qty 1

## 2018-02-21 MED ORDER — KETOROLAC TROMETHAMINE 30 MG/ML IJ SOLN
30.0000 mg | Freq: Once | INTRAMUSCULAR | Status: AC
  Administered 2018-02-21: 30 mg via INTRAVENOUS
  Filled 2018-02-21: qty 1

## 2018-02-21 MED ORDER — SODIUM CHLORIDE 0.9 % IV BOLUS
1000.0000 mL | Freq: Once | INTRAVENOUS | Status: AC
  Administered 2018-02-21: 1000 mL via INTRAVENOUS

## 2018-02-21 NOTE — Care Management Note (Signed)
Case Management Note  CM noted pt's return.  Contacted Santiago Glad with [facility] to find if pt was still active with them via orders 01/29/2018 that this CM initiated with CSW that day.  She advised the pt refused their services when contacted after leaving [facility].  Loucinda Croy, Benjaman Lobe, RN 02/21/2018, 9:26 AM

## 2018-02-21 NOTE — ED Notes (Signed)
Pt given food and drink.

## 2018-02-21 NOTE — ED Provider Notes (Signed)
[facility] DEPT Provider Note   CSN: [id number] Arrival date & time: 02/21/18  8250     History   Chief Complaint Chief Complaint  Patient presents with  . not eating  . Headache  . Oral Swelling    HPI Anthony Lamb is a 62 y.o. male.  Pt presents to the ED today for anxiety.  He has not been eating or drinking much.  He has a hx of alcohol and benzodiazepine abuse.  He has been drinking alcohol, but not as much as normal.  He has very poor dentition and his teeth are worn down to carious nubs.  He said his teeth have been hurting too much to drink.  When asked if he has food at his house, he said he does not have much.  He said he could eat here if we gave him some food.  He is also in the process of getting evicted from his home.  He wants to go to a group home, and is in the process of getting that situated.  He said he still has to take some papers to his doctor.  The pt c/o headaches as well.  Headaches and dental pains have been going on for years.  He also had surgery on his right wrist on 9/9 by Dr. Grandville Silos.  His wrist continues to hurt.     Past Medical History:  Diagnosis Date  . Alcohol abuse, in remission   . Anemia   . Ankylosing spondylitis ([place])   . Anxiety   . Chronic diarrhea   . Chronic pain   . Colitis   . Depression   . Esophagitis   . Gastritis   . GERD (gastroesophageal reflux disease)   . Hypertension   . IBS (irritable bowel syndrome)   . Poor dentition   . SIADH (syndrome of inappropriate ADH production) [facility])     Patient Active Problem List   Diagnosis Date Noted  . Alcohol withdrawal ([place]) 02/08/2018  . IBS (irritable bowel syndrome)   . Hypertension   . Colitis   . Adjustment disorder with mixed anxiety and depressed mood 05/11/2017  . Alcohol abuse   . Anxiety   . Gastroesophageal reflux disease   . Osteopenia determined by x-ray 08/06/2014  . Stress fracture of calcaneus 08/06/2014  . Vitamin D  deficiency 12/18/2013  . Dementia 12/18/2013  . Benign microscopic hematuria 07/06/2013  . SIADH (syndrome of inappropriate ADH production) ([place]) 06/22/2013  . Ankylosing spondylitis ([place]) 06/20/2013  . Malnutrition of moderate degree ([place]) 06/20/2013  . BPH (benign prostatic hyperplasia) 08/22/2010  . History of colonic polyps 08/13/2008  . Essential hypertension 07/03/2007  . PUD (peptic ulcer disease) 07/03/2007    Past Surgical History:  Procedure Laterality Date  . COLONOSCOPY W/ BIOPSIES    . ESOPHAGOGASTRODUODENOSCOPY    . HERNIA REPAIR Bilateral   . OPEN REDUCTION INTERNAL FIXATION (ORIF) DISTAL RADIAL FRACTURE Right 02/11/2018   Procedure: OPEN REDUCTION INTERNAL FIXATION (ORIF) DISTAL RADIAL FRACTURE;  Surgeon: Milly Jakob, MD;  Location: [facility];  Service: Orthopedics;  Laterality: Right;  . TONSILLECTOMY          Home Medications    Prior to Admission medications   Medication Sig Start Date End Date Taking? Authorizing Provider  acetaminophen (TYLENOL) 325 MG tablet Take 2 tablets (650 mg total) by mouth every 6 (six) hours as needed for mild pain (or Fever >/= 101). 02/11/18  Yes Milly Jakob, MD  hydrocortisone cream 1 %  Apply to affected area 2 times daily Patient taking differently: Apply 1 application topically 2 (two) times daily as needed for itching.  02/14/18  Yes Fawze, Mina A, PA-C  lisinopril (PRINIVIL,ZESTRIL) 10 MG tablet Take 1 tablet (10 mg total) by mouth daily. Patient taking differently: Take 10 mg by mouth at bedtime.  08/21/17 02/21/18 Yes Lacroce, Hulen Shouts, MD  LORazepam (ATIVAN) 1 MG tablet Take 1 mg by mouth 3 (three) times daily as needed for anxiety.   Yes [provider]  Multiple Vitamin (MULTIVITAMIN WITH MINERALS) TABS tablet Take 1 tablet by mouth daily.   Yes [provider]  oxyCODONE (OXY IR/ROXICODONE) 5 MG immediate release tablet Take 0-1 tablets (0-5 mg total) by mouth every 6 (six) hours as needed for moderate  pain or severe pain (postop pain). 02/11/18  Yes Milly Jakob, MD  pantoprazole (PROTONIX) 40 MG tablet Take 1 tablet (40 mg total) by mouth daily. 04/24/14  Yes Janith Lima, MD  sodium chloride (OCEAN) 0.65 % SOLN nasal spray Place 1-2 sprays into both nostrils as needed for congestion.    Yes [provider]  sucralfate (CARAFATE) 1 G tablet Take 1 tablet (1 g total) by mouth 4 (four) times daily -  with meals and at bedtime. Chew before swallowing 12/17/13  Yes Janith Lima, MD  traZODone (DESYREL) 100 MG tablet Take 100 mg by mouth at bedtime as needed. 02/18/18  Yes [provider]  verapamil (CALAN-SR) 240 MG CR tablet Take 240 mg by mouth at bedtime. 02/09/17  Yes [provider]  zolpidem (AMBIEN) 10 MG tablet Take 10 mg by mouth at bedtime as needed for sleep.   Yes [provider]  amoxicillin (AMOXIL) 500 MG capsule Take 1 capsule (500 mg total) by mouth 3 (three) times daily. 02/21/18   [name], MD  chlordiazePOXIDE (LIBRIUM) 25 MG capsule 54m PO TID x 1D, then 25-516mPO BID X 1D, then 25-5037mO QD X 1D Patient not taking: Reported on 02/21/2018 02/14/18   FawRodell Perna PA-C  chlorhexidine (PERIDEX) 0.12 % solution Use as directed 15 mLs in the mouth or throat 2 (two) times daily. 02/21/18   HavIsla PenceD  feeding supplement, ENSURE ENLIVE, (ENSURE ENLIVE) LIQD Take 237 mLs by mouth 3 (three) times daily between meals. Patient not taking: Reported on 02/14/2018 02/12/18   GooSamuella CotaD  folic acid (FOLVITE) 1 MG tablet Take 1 tablet (1 mg total) by mouth daily. Patient not taking: Reported on 02/14/2018 02/12/18   GooSamuella CotaD  thiamine 100 MG tablet Take 1 tablet (100 mg total) by mouth daily. Patient not taking: Reported on 02/14/2018 02/12/18   GooSamuella CotaD    Family History Family History  Problem Relation Age of Onset  . Anxiety disorder Mother   . Congestive Heart Failure Mother   . Crohn's disease  Mother   . Colon cancer Mother 60 65 Arthritis Father   . High blood pressure Father     Social History Social History   Tobacco Use  . Smoking status: Former Smoker    Types: Cigarettes    Last attempt to quit: 2014    Years since quitting: 5.7  . Smokeless tobacco: Never Used  Substance Use Topics  . Alcohol use: Yes    Comment: daily/ 3-4 beers, has not has any in a few days  . Drug use: No     Allergies   Diphenhydramine hcl; Flexeril [  cyclobenzaprine]; Hctz [hydrochlorothiazide]; and Nsaids   Review of Systems Review of Systems  Constitutional: Positive for appetite change.  HENT: Positive for dental problem.   Neurological: Positive for headaches.  Psychiatric/Behavioral: The patient is nervous/anxious.   All other systems reviewed and are negative.    Physical Exam Updated Vital Signs BP (!) 161/84   Pulse 72   Temp 98.8 F (37.1 C) (Oral)   Resp 19   Wt 81.2 kg   SpO2 97%   BMI 24.97 kg/m   Physical Exam  Constitutional: He is oriented to person, place, and time. He appears well-developed and well-nourished.  HENT:  Head: Normocephalic and atraumatic.  Pt's teeth are worn down to nubs.  He has evidence of gingivitis and overall poor oral health.  Eyes: Pupils are equal, round, and reactive to light. EOM are normal.  Neck: Normal range of motion. Neck supple.  Cardiovascular: Normal rate, regular rhythm, normal heart sounds and intact distal pulses.  Pulmonary/Chest: Effort normal and breath sounds normal.  Abdominal: Soft. Bowel sounds are normal.  Musculoskeletal: Normal range of motion.  Right wrist in a splint  Neurological: He is alert and oriented to person, place, and time. He has normal strength.  Skin: Skin is warm and dry. Capillary refill takes less than 2 seconds.  Psychiatric: His behavior is normal. His mood appears anxious.  Nursing note and vitals reviewed.    ED Treatments / Results  Labs (all labs ordered are listed, but  only abnormal results are displayed) Labs Reviewed  COMPREHENSIVE METABOLIC PANEL - Abnormal; Notable for the following components:      Result Value   Chloride 96 (*)    Glucose, Bld 104 (*)    BUN 5 (*)    All other components within normal limits  CBC WITH DIFFERENTIAL/PLATELET - Abnormal; Notable for the following components:   WBC 11.9 (*)    Platelets 450 (*)    Neutro Abs 9.7 (*)    All other components within normal limits  URINALYSIS, ROUTINE W REFLEX MICROSCOPIC - Abnormal; Notable for the following components:   Hgb urine dipstick SMALL (*)    All other components within normal limits  ETHANOL  MAGNESIUM    EKG EKG Interpretation  Date/Time:  Thursday February 21 2018 10:01:52 EDT Ventricular Rate:  77 PR Interval:    QRS Duration: 108 QT Interval:  421 QTC Calculation: 477 R Axis:   35 Text Interpretation:  Sinus rhythm Borderline prolonged QT interval No significant change since last tracing Confirmed by [name] [phone]) on 02/21/2018 10:17:38 AM   Radiology No results found.  Procedures Procedures (including critical care time)  Medications Ordered in ED Medications  LORazepam (ATIVAN) injection 1 mg (has no administration in time range)  sodium chloride 0.9 % bolus 1,000 mL (0 mLs Intravenous Stopped 02/21/18 1042)  amoxicillin (AMOXIL) capsule 500 mg (500 mg Oral Given 02/21/18 1034)  LORazepam (ATIVAN) injection 1 mg (1 mg Intravenous Given 02/21/18 1035)  ondansetron (ZOFRAN) injection 4 mg (4 mg Intravenous Given 02/21/18 1035)  morphine 4 MG/ML injection 4 mg (4 mg Intravenous Given 02/21/18 1035)     Initial Impression / Assessment and Plan / ED Course  I have reviewed the triage vital signs and the nursing notes.  Pertinent labs & imaging results that were available during my care of the patient were reviewed by me and considered in my medical decision making (see chart for details).    Pt is feeling much better after fluids  and ativan.   He is able to eat here.  He will be d/c with amox and peridex and encouraged to f/u with the dentist.  He knows to return if worse.  F/u with pcp.  Final Clinical Impressions(s) / ED Diagnoses   Final diagnoses:  Dental caries  Alcohol abuse    ED Discharge Orders         Ordered    amoxicillin (AMOXIL) 500 MG capsule  3 times daily     02/21/18 1150    chlorhexidine (PERIDEX) 0.12 % solution  2 times daily     02/21/18 1150           [name], MD 02/21/18 1202

## 2018-02-21 NOTE — ED Triage Notes (Signed)
Pt c/o not eating and drinking enough in past several weeks. About to be kicked out of his apartment and trying to get into group home. Reports that he still drinks ETOH but "not as much as usual". C/o headache since last night and dental abscess on left upper for years.

## 2018-02-28 ENCOUNTER — Ambulatory Visit: Payer: Medicaid Other | Attending: Orthopedic Surgery | Admitting: Occupational Therapy

## 2018-02-28 ENCOUNTER — Ambulatory Visit: Payer: Medicaid Other | Admitting: Occupational Therapy

## 2018-02-28 DIAGNOSIS — M6281 Muscle weakness (generalized): Secondary | ICD-10-CM | POA: Insufficient documentation

## 2018-02-28 DIAGNOSIS — M25631 Stiffness of right wrist, not elsewhere classified: Secondary | ICD-10-CM | POA: Diagnosis present

## 2018-02-28 DIAGNOSIS — M25531 Pain in right wrist: Secondary | ICD-10-CM | POA: Diagnosis present

## 2018-02-28 NOTE — Patient Instructions (Signed)
AROM: Wrist Extension    With right palm down, bend wrist up. Repeat _10___ times per set. Do _1___ sets per session. Do _4-6___ sessions per day.  AROM: Wrist Flexion    With right palm up, bend wrist up. Repeat _10___ times per set. Do _1___ sets per session. Do _4-6___ sessions per day.   AROM: Wrist Radial / Ulnar Deviation    Gently bend right wrist from side to side as far as possible. Repeat _10___ times per set. Do __1__ sets per session. Do _4-6___ sessions per day.     CAN DO BELOW EXERCISES WITH SPLINT ON  Combination Movement (Active)    Keep elbow firmly at side with wrist straight. Turn palm up and down. Repeat __10__ times. Do _4-6___ sessions per day.   AROM: Finger Flexion / Extension    Actively bend fingers of right hand. Start with knuckles furthest from palm, and slowly make a fist. Hold _3___ seconds. Relax. Then straighten fingers as far as possible. Repeat _10___ times per set. Do _1___ sets per session. Do __4-6__ sessions per day.   SPLINT WEAR AND CARE  WEARING SCHEDULE:  Wear splint at ALL times except for hygiene care AND may remove splint for exercises and then immediately place back on  PURPOSE:  To prevent movement and for protection until injury can heal  CARE OF SPLINT:  Keep splint away from heat sources including: stove, radiator or furnace, or a car in sunlight. The splint can melt and will no longer fit you properly  Keep away from pets and children  Clean the splint with rubbing alcohol 1-2 times per day.  * During this time, make sure you also clean your hand/arm as instructed by your therapist and/or perform dressing changes as needed. Then dry hand/arm completely before replacing splint. (When cleaning hand/arm, keep it immobilized in same position until splint is replaced)  PRECAUTIONS/POTENTIAL PROBLEMS: *If you notice or experience increased pain, swelling, numbness, or a lingering reddened area from the  splint: Contact your therapist immediately by calling [phone]. You must wear the splint for protection, but we will get you scheduled for adjustments as quickly as possible.  (If only straps or hooks need to be replaced and NO adjustments to the splint need to be made, just call the office ahead and let them know you are coming in)  If you have any medical concerns or signs of infection, please call your doctor immediately
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
## 2018-03-04 ENCOUNTER — Encounter (HOSPITAL_COMMUNITY): Payer: Self-pay | Admitting: Emergency Medicine

## 2018-03-04 ENCOUNTER — Emergency Department (HOSPITAL_COMMUNITY)
Admission: EM | Admit: 2018-03-04 | Discharge: 2018-03-04 | Disposition: A | Discharge status: Home / Self Care | Payer: Medicaid Other | Attending: Emergency Medicine | Admitting: Emergency Medicine

## 2018-03-04 ENCOUNTER — Emergency Department (HOSPITAL_COMMUNITY): Payer: Medicaid Other

## 2018-03-04 DIAGNOSIS — Z87891 Personal history of nicotine dependence: Secondary | ICD-10-CM | POA: Insufficient documentation

## 2018-03-04 DIAGNOSIS — Z79899 Other long term (current) drug therapy: Secondary | ICD-10-CM | POA: Diagnosis not present

## 2018-03-04 DIAGNOSIS — I10 Essential (primary) hypertension: Secondary | ICD-10-CM | POA: Diagnosis not present

## 2018-03-04 DIAGNOSIS — M25531 Pain in right wrist: Secondary | ICD-10-CM | POA: Diagnosis present

## 2018-03-04 DIAGNOSIS — F419 Anxiety disorder, unspecified: Secondary | ICD-10-CM | POA: Diagnosis not present

## 2018-03-04 DIAGNOSIS — Z9119 Patient's noncompliance with other medical treatment and regimen: Secondary | ICD-10-CM | POA: Insufficient documentation

## 2018-03-04 MED ORDER — HYDROCODONE-ACETAMINOPHEN 5-325 MG PO TABS
2.0000 | ORAL_TABLET | Freq: Once | ORAL | Status: AC
  Administered 2018-03-04: 2 via ORAL
  Filled 2018-03-04: qty 2

## 2018-03-04 NOTE — Discharge Instructions (Addendum)
See Dr. Nancy Fetter as scheduled for anxiety medications

## 2018-03-04 NOTE — ED Notes (Signed)
Bed: WTR6 Expected date:  Expected time:  Means of arrival:  Comments: 
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
## 2018-03-04 NOTE — Progress Notes (Signed)
CSW aware of consult and familiar with patient from prior ED visits. CSW spoke with patient at bedside. CSW informed by patient that he was supposed to be at court today for his eviction. Per patient, his wrist was hurting him and he was out of medication so he came to the ED instead. Per patient, his care coordinator through [facility] is working hard to find him a place to live through transitional housing. CSW received verbal consent to reach out to [employer] [phone] regarding updates with patient's care. CSW left voicemail for return call. Patient unsure if he will have to be out of his apartment today and won't know until he goes home. CSW will continue to follow.   Ollen Barges, [place] Work Department  [employer]  [phone]

## 2018-03-04 NOTE — ED Triage Notes (Signed)
Pt reports that he hasnt been eating and drinking anything due to his anxiety about not able to get into t group home or any where to live. Reports that he hasnt had any ETOH or anxiety medications since Saturday.  C/o right worst pain, wearing a brace at this time.

## 2018-03-09 ENCOUNTER — Emergency Department (HOSPITAL_COMMUNITY): Payer: Medicaid Other

## 2018-03-09 ENCOUNTER — Emergency Department (HOSPITAL_COMMUNITY)
Admission: EM | Admit: 2018-03-09 | Discharge: 2018-03-09 | Disposition: A | Discharge status: Home / Self Care | Payer: Medicaid Other | Attending: Emergency Medicine | Admitting: Emergency Medicine

## 2018-03-09 ENCOUNTER — Encounter (HOSPITAL_COMMUNITY): Payer: Self-pay | Admitting: Emergency Medicine

## 2018-03-09 DIAGNOSIS — R0789 Other chest pain: Secondary | ICD-10-CM | POA: Diagnosis not present

## 2018-03-09 DIAGNOSIS — F419 Anxiety disorder, unspecified: Secondary | ICD-10-CM | POA: Insufficient documentation

## 2018-03-09 DIAGNOSIS — Z87891 Personal history of nicotine dependence: Secondary | ICD-10-CM | POA: Diagnosis not present

## 2018-03-09 DIAGNOSIS — Z79899 Other long term (current) drug therapy: Secondary | ICD-10-CM | POA: Insufficient documentation

## 2018-03-09 DIAGNOSIS — I10 Essential (primary) hypertension: Secondary | ICD-10-CM | POA: Insufficient documentation

## 2018-03-09 LAB — I-STAT TROPONIN, ED: TROPONIN I, POC: 0 ng/mL (ref 0.00–0.08)

## 2018-03-09 LAB — BASIC METABOLIC PANEL
ANION GAP: 8 (ref 5–15)
CO2: 27 mmol/L (ref 22–32)
Calcium: 8.8 mg/dL — ABNORMAL LOW (ref 8.9–10.3)
Chloride: 97 mmol/L — ABNORMAL LOW (ref 98–111)
Creatinine, Ser: 0.78 mg/dL (ref 0.61–1.24)
GFR calc Af Amer: >60 mL/min (ref >60)
Glucose, Bld: 105 mg/dL — ABNORMAL HIGH (ref 70–99)
Potassium: 3.9 mmol/L (ref 3.5–5.1)
SODIUM: 132 mmol/L — AB (ref 135–145)

## 2018-03-09 LAB — CBC
HCT: 43.3 % (ref 39.0–52.0)
Hemoglobin: 14 g/dL (ref 13.0–17.0)
MCH: 29.7 pg (ref 26.0–34.0)
MCHC: 32.3 g/dL (ref 30.0–36.0)
MCV: 91.9 fL (ref 78.0–100.0)
Platelets: 297 10*3/uL (ref 150–400)
RBC: 4.71 MIL/uL (ref 4.22–5.81)
RDW: 13.2 % (ref 11.5–15.5)
WBC: 7.1 10*3/uL (ref 4.0–10.5)

## 2018-03-09 MED ORDER — LORAZEPAM 1 MG PO TABS
1.0000 mg | ORAL_TABLET | Freq: Once | ORAL | Status: AC
  Administered 2018-03-09: 1 mg via ORAL
  Filled 2018-03-09: qty 1

## 2018-03-09 NOTE — ED Notes (Signed)
Pt stable, ambulatory, states understanding of discharge instructions 

## 2018-03-09 NOTE — ED Triage Notes (Signed)
Per [facility]: Patient to ED c/o anxiety, intermittent central, non-radiating CP today and generalized weakness and tremors. States he has been out of his anti-anxiety medication for 3 days. Denies chest pain at this time, no shortness of breath, N/V, dizziness. EMS VS: 163/94, HR 81 NSR, RR 18, 98% RA, CBG 134.
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
## 2018-03-09 NOTE — ED Notes (Signed)
Pt given cup of water per ED MD

## 2018-03-09 NOTE — ED Notes (Signed)
Patient transported to X-ray 

## 2018-03-11 ENCOUNTER — Ambulatory Visit: Payer: Medicaid Other | Attending: Orthopedic Surgery | Admitting: Occupational Therapy

## 2018-03-11 DIAGNOSIS — M25531 Pain in right wrist: Secondary | ICD-10-CM | POA: Insufficient documentation

## 2018-03-11 DIAGNOSIS — M6281 Muscle weakness (generalized): Secondary | ICD-10-CM | POA: Diagnosis present

## 2018-03-11 DIAGNOSIS — M25631 Stiffness of right wrist, not elsewhere classified: Secondary | ICD-10-CM | POA: Insufficient documentation

## 2018-03-11 NOTE — Patient Instructions (Signed)
Composite Extension (Passive Flexor Stretch)    Sitting with elbows on table and palms together, slowly lower wrists toward table until stretch is felt. Be sure to keep palms together throughout stretch. Hold _20___ seconds. Relax. Repeat __5__ times. Do __4-6__ sessions per day.   Flexion (Passive)    With elbow resting on pad-ded surface, let wrist drop down. Apply gentle down-ward push with fingers of other hand. Hold __20__ seconds. Repeat _5___ times. Do __4-6__ sessions per day.   Supination (Passive)    Keep elbow bent at right angle and held firmly at side. Use other hand to turn forearm until palm faces upward. Hold __20__ seconds. Then repeat palm down and hold 20 sec.  Repeat _5___ times EACH way. Do __4-6__ sessions per day.

## 2018-03-11 NOTE — Therapy (Signed)
[facility] [address] [place], [place], [address] Phone: [phone]   Fax:  [phone]  Occupational Therapy Treatment  Patient Details  Name: Anthony Lamb MRN: [id number] Date of Birth: 06-Nov-1955 No data recorded  Encounter Date: 03/11/2018  OT End of Session - 03/11/18 1417    Visit Number  2    Number of Visits  8    Date for OT Re-Evaluation  04/30/18    Authorization Type  MCD     Authorization Time Period  Approved 3 visits 03/11/18 - 03/31/18    Authorization - Visit Number  1    Authorization - Number of Visits  3    OT Start Time  1230    OT Stop Time  1315    OT Time Calculation (min)  45 min    Activity Tolerance  Patient tolerated treatment well    Behavior During Therapy  Anxious       Past Medical History:  Diagnosis Date  . Alcohol abuse, in remission   . Anemia   . Ankylosing spondylitis ([place])   . Anxiety   . Chronic diarrhea   . Chronic pain   . Colitis   . Depression   . Esophagitis   . Gastritis   . GERD (gastroesophageal reflux disease)   . Hypertension   . IBS (irritable bowel syndrome)   . Poor dentition   . SIADH (syndrome of inappropriate ADH production) ([place])     Past Surgical History:  Procedure Laterality Date  . COLONOSCOPY W/ BIOPSIES    . ESOPHAGOGASTRODUODENOSCOPY    . HERNIA REPAIR Bilateral   . OPEN REDUCTION INTERNAL FIXATION (ORIF) DISTAL RADIAL FRACTURE Right 02/11/2018   Procedure: OPEN REDUCTION INTERNAL FIXATION (ORIF) DISTAL RADIAL FRACTURE;  Surgeon: Milly Jakob, MD;  Location: [facility];  Service: Orthopedics;  Laterality: Right;  . TONSILLECTOMY      There were no vitals filed for this visit.  Subjective Assessment - 03/11/18 1233    Subjective   I'm all out of stockinette now    Pertinent History  s/p ORIF 02/11/18 for Rt distal radius fx. PMH: ETOH abuse, med abuse, HTN, IBS, GERD    Patient Stated Goals  Be able to use my Rt hand again     Currently in Pain?  Yes    Pain Score  7     Pain Location  Wrist    Pain Orientation  Right    Pain Descriptors / Indicators  Aching    Pain Type  Acute pain    Pain Onset  1 to 4 weeks ago    Pain Frequency  Constant    Aggravating Factors   movement, bumping it    Pain Relieving Factors  rest                   OT Treatments/Exercises (OP) - 03/11/18 0001      ADLs   ADL Comments  Pt has some pitting edema dorsal side of hand and recommended making straps a little looser      Wrist Exercises   Other wrist exercises  Reviewed A/ROM HEP. Pt required mod cues to perform correctly. Pt admits he has not been doing at home. Issued P/ROM HEP and pt demo each as instructed with cueing. Pt performed forearm gym x 2 full revolutions      Splinting   Splinting  Splint looks ok with no signs of pressure sores. Pt reports it bothers  him but does not appear to be causing any problems, just "hard"       Pt issued more stockinette and washed hands/forearms at beginning of treatment session      OT Education - 03/11/18 1302    Education Details  P/ROM HEP    Person(s) Educated  Patient    Methods  Explanation;Demonstration;Handout    Comprehension  Verbalized understanding;Returned demonstration       OT Short Term Goals - 02/28/18 1612      OT SHORT TERM GOAL #1   Title  Independent with splint wear and care    Baseline  issued, may need review and adjustments    Time  4    Period  Weeks    Status  On-going      OT SHORT TERM GOAL #2   Title  Independent with initial HEP     Baseline  issued A/ROM HEP, will need P/ROM HEP     Time  4    Period  Weeks    Status  On-going      OT SHORT TERM GOAL #3   Title  Pt to report pain less than or equal to 5/10 w/ A/ROM Rt wrist    Baseline  9/10    Time  4    Period  Weeks    Status  New        OT Long Term Goals - 02/28/18 1614      OT LONG TERM GOAL #1   Title  Independent with updated strengthening HEP      Baseline  DEPENDENT d/t current precautions    Time  8    Period  Weeks    Status  New      OT LONG TERM GOAL #2   Title  Pt to improve Rt wrist extension to 40 degrees for functional tasks    Baseline  eval = 25*    Time  8    Period  Weeks    Status  New      OT LONG TERM GOAL #3   Title  Pt to improve supination and pronation by 20 degrees or more for functional tasksk    Baseline  eval: sup = 40*, pron = 65*    Time  8    Period  Weeks    Status  New      OT LONG TERM GOAL #4   Title  Pt to demo 30 lbs or greater grip strength Rt dominant hand to assist w/ opening jars/containers    Baseline  eval: dependent d/t current precautions    Time  8    Period  Weeks    Status  New      OT LONG TERM GOAL #5   Title  Pt to return to using Rt hand as dominant hand for all BADLS and light IADLS    Baseline  Using Lt non dominant hand    Time  8    Period  Weeks    Status  New            Plan - 03/11/18 1418    Clinical Impression Statement  Pt with poor carryover of recommendations d/t high anxiety and ? cognitive deficits. Pt admits he is not doing HEP like he should    Occupational Profile and client history currently impacting functional performance  anxiety, depression, ETOH abuse, benzodiazepine abuse, HTN, IBS, GERD    Occupational performance deficits (Please refer to evaluation for  details):  ADL's;IADL's;Rest and Sleep;Social Participation    Rehab Potential  Fair    Current Impairments/barriers affecting progress:  Pt's comorbidities including severe anxiety, poor social support, transportation issues    OT Frequency  1x / week    OT Duration  8 weeks    OT Treatment/Interventions  Self-care/ADL training;Moist Heat;Fluidtherapy;DME and/or AE instruction;Splinting;Compression bandaging;Therapeutic activities;Coping strategies training;Therapeutic exercise;Scar mobilization;Cryotherapy;Passive range of motion;Electrical Stimulation;Paraffin;Manual  Therapy;Patient/family education    Plan  review HEP's, fluidotherapy, forearm gym, wrist winder w/o weight    Consulted and Agree with Plan of Care  Patient       Patient will benefit from skilled therapeutic intervention in order to improve the following deficits and impairments:  Decreased coordination, Decreased range of motion, Impaired flexibility, Increased edema, Impaired sensation, Decreased coping skills, Decreased knowledge of precautions, Impaired UE functional use, Pain, Decreased strength, Decreased psychosocial skills  Visit Diagnosis: Stiffness of right wrist, not elsewhere classified  Pain in right wrist  Muscle weakness (generalized)    Problem List Patient Active Problem List   Diagnosis Date Noted  . Alcohol withdrawal ([place]) 02/08/2018  . IBS (irritable bowel syndrome)   . Hypertension   . Colitis   . Adjustment disorder with mixed anxiety and depressed mood 05/11/2017  . Alcohol abuse   . Anxiety   . Gastroesophageal reflux disease   . Osteopenia determined by x-ray 08/06/2014  . Stress fracture of calcaneus 08/06/2014  . Vitamin D deficiency 12/18/2013  . Dementia ([place]) 12/18/2013  . Benign microscopic hematuria 07/06/2013  . SIADH (syndrome of inappropriate ADH production) ([place]) 06/22/2013  . Ankylosing spondylitis ([place]) 06/20/2013  . Malnutrition of moderate degree ([place]) 06/20/2013  . BPH (benign prostatic hyperplasia) 08/22/2010  . History of colonic polyps 08/13/2008  . Essential hypertension 07/03/2007  . PUD (peptic ulcer disease) 07/03/2007    Carey Bullocks, OTR/L 03/11/2018, 2:20 PM  [facility] [address] [place], [place], [address] Phone: [phone]   Fax:  [phone]  Name: Anthony Lamb MRN: [id number] Date of Birth: November 12, 1955

## 2018-03-12 ENCOUNTER — Emergency Department (HOSPITAL_COMMUNITY)
Admission: EM | Admit: 2018-03-12 | Discharge: 2018-03-12 | Disposition: A | Discharge status: Home / Self Care | Payer: Medicaid Other | Attending: Emergency Medicine | Admitting: Emergency Medicine

## 2018-03-12 DIAGNOSIS — Z79899 Other long term (current) drug therapy: Secondary | ICD-10-CM | POA: Insufficient documentation

## 2018-03-12 DIAGNOSIS — F4323 Adjustment disorder with mixed anxiety and depressed mood: Secondary | ICD-10-CM | POA: Diagnosis not present

## 2018-03-12 DIAGNOSIS — F419 Anxiety disorder, unspecified: Secondary | ICD-10-CM | POA: Diagnosis not present

## 2018-03-12 DIAGNOSIS — R45851 Suicidal ideations: Secondary | ICD-10-CM | POA: Diagnosis not present

## 2018-03-12 DIAGNOSIS — Z87891 Personal history of nicotine dependence: Secondary | ICD-10-CM | POA: Diagnosis not present

## 2018-03-12 DIAGNOSIS — I10 Essential (primary) hypertension: Secondary | ICD-10-CM | POA: Insufficient documentation

## 2018-03-12 LAB — COMPREHENSIVE METABOLIC PANEL
ALBUMIN: 3.9 g/dL (ref 3.5–5.0)
ALK PHOS: 82 U/L (ref 38–126)
ALT: 15 U/L (ref 0–44)
ANION GAP: 12 (ref 5–15)
AST: 19 U/L (ref 15–41)
BILIRUBIN TOTAL: 0.7 mg/dL (ref 0.3–1.2)
BUN: 5 mg/dL — ABNORMAL LOW (ref 8–23)
CHLORIDE: 98 mmol/L (ref 98–111)
CO2: 24 mmol/L (ref 22–32)
CREATININE: 0.74 mg/dL (ref 0.61–1.24)
Calcium: 9.5 mg/dL (ref 8.9–10.3)
GFR calc Af Amer: >60 mL/min (ref >60)
GFR calc non Af Amer: >60 mL/min (ref >60)
Glucose, Bld: 97 mg/dL (ref 70–99)
Potassium: 4.2 mmol/L (ref 3.5–5.1)
SODIUM: 134 mmol/L — AB (ref 135–145)
Total Protein: 7.3 g/dL (ref 6.5–8.1)

## 2018-03-12 LAB — CBC WITH DIFFERENTIAL/PLATELET
Abs Immature Granulocytes: 0.03 10*3/uL (ref 0.00–0.07)
BASOS ABS: 0.1 10*3/uL (ref 0.0–0.1)
BASOS PCT: 1 %
EOS ABS: 0.1 10*3/uL (ref 0.0–0.5)
EOS PCT: 1 %
HCT: 42.3 % (ref 39.0–52.0)
Hemoglobin: 13.9 g/dL (ref 13.0–17.0)
IMMATURE GRANULOCYTES: 0 %
Lymphocytes Relative: 14 %
Lymphs Abs: 1.3 10*3/uL (ref 0.7–4.0)
MCH: 30.3 pg (ref 26.0–34.0)
MCHC: 32.9 g/dL (ref 30.0–36.0)
MCV: 92.2 fL (ref 80.0–100.0)
Monocytes Absolute: 0.8 10*3/uL (ref 0.1–1.0)
Monocytes Relative: 8 %
NEUTROS PCT: 76 %
Neutro Abs: 7.3 10*3/uL (ref 1.7–7.7)
PLATELETS: UNDETERMINED 10*3/uL (ref 150–400)
RBC: 4.59 MIL/uL (ref 4.22–5.81)
RDW: 13.4 % (ref 11.5–15.5)
WBC: 9.5 10*3/uL (ref 4.0–10.5)
nRBC: 0 % (ref 0.0–0.2)

## 2018-03-12 LAB — RAPID URINE DRUG SCREEN, HOSP PERFORMED
AMPHETAMINES: NOT DETECTED
BENZODIAZEPINES: POSITIVE — AB
Barbiturates: NOT DETECTED
COCAINE: NOT DETECTED
OPIATES: NOT DETECTED
TETRAHYDROCANNABINOL: NOT DETECTED

## 2018-03-12 LAB — ETHANOL: Alcohol, Ethyl (B): <10 mg/dL (ref <10)

## 2018-03-12 NOTE — Progress Notes (Addendum)
CSW familiar with patient from prior ED visits. CSW spoke with patient at bedside at the request of patient. Per patient, he is being served his eviction notice on Monday and feels like he doesn't want to live anymore. Patient requested that CSW reach out to his good friend, Elsie Amis [phone], to inform her patient was in the hospital. Joffre spoke with Dyann Ruddle who was surprised patient was at the hospital. Per Dyann Ruddle, she has been speaking with patient all week about being patient with process. CSW aware patient's supports are trying to find housing for patient and are looking into transitional housing. Patient concerned housing will not be obtained before he is evicted from his home. Patient unsure what the letters will say and if he will have to be out of his home that date. CSW received verbal permission to reach out to Nonda Lou, [facility] [phone], regarding updates with patient's care. CSW left voicemail for return call. CSW will continue to follow.  1:24pm- CSW received return call from [facility] regarding patient. CSW updated Char[facility] of patient's presence in the ED. CSW informed Severiano Gilbert that patient is here due to his anxiety surrounding eviction papers. Per Severiano Gilbert, patient's therapist has explained the eviction process to patient and that he should be given a time frame to be out of the home. Per Severiano Gilbert, patient has been offered Adult Care Homes to stay but has turned them down stating he'd rather wait until [employer] can find patient independent housing. Per Severiano Gilbert, patient should not have to worry about going to a homeless shelter as [facility] has supports in place to ensure that does not happen. CSW informed by Severiano Gilbert that she was going to update patient's support team of his visit and they would follow up with patient. CSW to assist patient with transportation back to his home.   Ollen Barges, [place] Work [employer]  [employer]  [phone]

## 2018-03-12 NOTE — ED Provider Notes (Signed)
[facility] DEPT Provider Note  CSN: [id number] Arrival date & time: 03/12/18  0935  History   Chief Complaint Chief Complaint  Patient presents with  . Suicidal   HPI Anthony Lamb is a 62 y.o. male with a medical history of chronic pain, GERD, HTN, IBS, SIADH, ankylosing spondylitis and psychiatric history of alcohol use, anxiety and adjustment disorder who presented to the ED for suicidal ideation. Patient endorses SI in the context of pending eviction from his apartment which is supposed to happen next Monday 03/18/18. He reports working with social workers on finding appropriate housing. Patient has no plans for suicide or self-harm. Denies HI and AVH. Patient reports chronic abdominal and back pain, but has no other physical complaints.  Past Medical History:  Diagnosis Date  . Alcohol abuse, in remission   . Anemia   . Ankylosing spondylitis ([place])   . Anxiety   . Chronic diarrhea   . Chronic pain   . Colitis   . Depression   . Esophagitis   . Gastritis   . GERD (gastroesophageal reflux disease)   . Hypertension   . IBS (irritable bowel syndrome)   . Poor dentition   . SIADH (syndrome of inappropriate ADH production) [facility])     Patient Active Problem List   Diagnosis Date Noted  . Alcohol withdrawal ([place]) 02/08/2018  . IBS (irritable bowel syndrome)   . Hypertension   . Colitis   . Adjustment disorder with mixed anxiety and depressed mood 05/11/2017  . Alcohol abuse   . Anxiety   . Gastroesophageal reflux disease   . Osteopenia determined by x-ray 08/06/2014  . Stress fracture of calcaneus 08/06/2014  . Vitamin D deficiency 12/18/2013  . Dementia ([place]) 12/18/2013  . Benign microscopic hematuria 07/06/2013  . SIADH (syndrome of inappropriate ADH production) ([place]) 06/22/2013  . Ankylosing spondylitis ([facility]) 06/20/2013  . Malnutrition of moderate degree ([place]) 06/20/2013  . BPH (benign prostatic hyperplasia) 08/22/2010  . History  of colonic polyps 08/13/2008  . Essential hypertension 07/03/2007  . PUD (peptic ulcer disease) 07/03/2007    Past Surgical History:  Procedure Laterality Date  . COLONOSCOPY W/ BIOPSIES    . ESOPHAGOGASTRODUODENOSCOPY    . HERNIA REPAIR Bilateral   . OPEN REDUCTION INTERNAL FIXATION (ORIF) DISTAL RADIAL FRACTURE Right 02/11/2018   Procedure: OPEN REDUCTION INTERNAL FIXATION (ORIF) DISTAL RADIAL FRACTURE;  Surgeon: Milly Jakob, MD;  Location: [facility];  Service: Orthopedics;  Laterality: Right;  . TONSILLECTOMY          Home Medications    Prior to Admission medications   Medication Sig Start Date End Date Taking? Authorizing Provider  acetaminophen (TYLENOL) 325 MG tablet Take 2 tablets (650 mg total) by mouth every 6 (six) hours as needed for mild pain (or Fever >/= 101). 02/11/18   Milly Jakob, MD  amoxicillin (AMOXIL) 500 MG capsule Take 1 capsule (500 mg total) by mouth 3 (three) times daily. 02/21/18   [name], MD  chlordiazePOXIDE (LIBRIUM) 25 MG capsule 26m PO TID x 1D, then 25-535mPO BID X 1D, then 25-5033mO QD X 1D Patient not taking: Reported on 02/21/2018 02/14/18   FawRodell Perna PA-C  chlorhexidine (PERIDEX) 0.12 % solution Use as directed 15 mLs in the mouth or throat 2 (two) times daily. 02/21/18   HavIsla PenceD  feeding supplement, ENSURE ENLIVE, (ENSURE ENLIVE) LIQD Take 237 mLs by mouth 3 (three) times daily between meals. Patient not taking: Reported on 02/14/2018 02/12/18  Samuella Cota, MD  folic acid (FOLVITE) 1 MG tablet Take 1 tablet (1 mg total) by mouth daily. Patient not taking: Reported on 02/14/2018 02/12/18   Samuella Cota, MD  hydrocortisone cream 1 % Apply to affected area 2 times daily Patient taking differently: Apply 1 application topically 2 (two) times daily as needed for itching.  02/14/18   Fawze, Mina A, PA-C  lisinopril (PRINIVIL,ZESTRIL) 10 MG tablet Take 1 tablet (10 mg total) by mouth daily. Patient taking differently:  Take 10 mg by mouth at bedtime.  08/21/17 02/21/18  Lacroce, Hulen Shouts, MD  LORazepam (ATIVAN) 1 MG tablet Take 1 mg by mouth 3 (three) times daily as needed for anxiety.    [provider]  Multiple Vitamin (MULTIVITAMIN WITH MINERALS) TABS tablet Take 1 tablet by mouth daily.    [provider]  oxyCODONE (OXY IR/ROXICODONE) 5 MG immediate release tablet Take 0-1 tablets (0-5 mg total) by mouth every 6 (six) hours as needed for moderate pain or severe pain (postop pain). 02/11/18   Milly Jakob, MD  pantoprazole (PROTONIX) 40 MG tablet Take 1 tablet (40 mg total) by mouth daily. 04/24/14   Janith Lima, MD  sodium chloride (OCEAN) 0.65 % SOLN nasal spray Place 1-2 sprays into both nostrils as needed for congestion.     [provider]  sucralfate (CARAFATE) 1 G tablet Take 1 tablet (1 g total) by mouth 4 (four) times daily -  with meals and at bedtime. Chew before swallowing 12/17/13   Janith Lima, MD  thiamine 100 MG tablet Take 1 tablet (100 mg total) by mouth daily. Patient not taking: Reported on 02/14/2018 02/12/18   Samuella Cota, MD  traZODone (DESYREL) 100 MG tablet Take 100 mg by mouth at bedtime as needed. 02/18/18   [provider]  verapamil (CALAN-SR) 240 MG CR tablet Take 240 mg by mouth at bedtime. 02/09/17   [provider]  zolpidem (AMBIEN) 10 MG tablet Take 10 mg by mouth at bedtime as needed for sleep.    [provider]    Family History Family History  Problem Relation Age of Onset  . Anxiety disorder Mother   . Congestive Heart Failure Mother   . Crohn's disease Mother   . Colon cancer Mother 23  . Arthritis Father   . High blood pressure Father     Social History Social History   Tobacco Use  . Smoking status: Former Smoker    Types: Cigarettes    Last attempt to quit: 2014    Years since quitting: 5.7  . Smokeless tobacco: Never Used  Substance Use Topics  . Alcohol use: Yes    Comment: daily/  3-4 beers, has not has any in a few days  . Drug use: No     Allergies   Diphenhydramine hcl; Flexeril [cyclobenzaprine]; Hctz [hydrochlorothiazide]; and Nsaids   Review of Systems Review of Systems  Musculoskeletal:       Chronic pain  Skin: Negative.   Psychiatric/Behavioral: Positive for suicidal ideas. The patient is nervous/anxious.   All other systems reviewed and are negative.  Physical Exam Updated Vital Signs BP (!) 181/90 (BP Location: Left Arm)   Pulse 85   Temp (!) 97.5 F (36.4 C) (Oral)   Resp 15   Ht 5' 11"  (1.803 m)   Wt 79.4 kg   SpO2 98%   BMI 24.41 kg/m   Physical Exam  Constitutional: He appears well-developed and well-nourished. He  is cooperative.  HENT:  Head: Normocephalic and atraumatic.  Eyes: Pupils are equal, round, and reactive to light. Conjunctivae, EOM and lids are normal.  Cardiovascular: Normal rate, regular rhythm, normal heart sounds and intact distal pulses.  Pulmonary/Chest: Effort normal and breath sounds normal.  Abdominal: Soft. Bowel sounds are normal. There is no tenderness.  Musculoskeletal: Normal range of motion.  Neurological: He is alert. He has normal strength. No cranial nerve deficit or sensory deficit. Gait normal.  Skin: Skin is warm and intact. Capillary refill takes less than 2 seconds.  Nursing note and vitals reviewed.  ED Treatments / Results  Labs (all labs ordered are listed, but only abnormal results are displayed) Labs Reviewed  COMPREHENSIVE METABOLIC PANEL  ETHANOL  RAPID URINE DRUG SCREEN, HOSP PERFORMED  CBC WITH DIFFERENTIAL/PLATELET    EKG None  Radiology No results found.  Procedures Procedures (including critical care time)  Medications Ordered in ED Medications - No data to display   Initial Impression / Assessment and Plan / ED Course  Triage vital signs and the nursing notes have been reviewed.  Pertinent labs & imaging results that were available during care of the patient  were reviewed and considered in medical decision making (see chart for details).  Patient presents to the ED with SI in the context of pending eviction. His thought process is logical, linear, goal directed and future oriented. It is suspected that patient is malingering for the secondary gain of housing. Patient is well known to the psychiatric service and he requests to speak specifically to Ollen Barges, LCSW. Patient has no specific physical complaints and his physical exam is unremarkable. Will contact Ollen Barges to assist in assessment of this patient.  Clinical Course as of Mar 13 1419  Tue Mar 12, 2018  1056 EKG NSR. No ST changes to suggest infarct or ischemia. Labs unremarkable. Medically cleared.   [GM]  1155 Case discussed with social worker who is familiar with the patient. Per history from CSW and medical record, patient frequently comes to the ED when his eviction is looming. He has reportedly missed court hearings by coming to the ED which has further hindered him from getting appropriate housing. CSW states that she will speak with patient and provide resources for housing prior to discharge.   [GM]  1403 Spoke with CSW after she spoke with patient. Patient's care coordinator also involved in the conversation. Patient's housing is secured through the eviction date of next Monday 03/18/18. There are also other supports in place that patient accesses on a regular basis. Patient has been informed about this and will be discharged home. Patient requests benzos for anxiety prior to discharge. Patient has active Rx for Ativan prescribed by his PCP (confirmed by [place] Controlled Substance Database) who he has an appointment with on Thursday 03/14/18. Patient offered Vistaril and other non-benzo options for anxiety which he declines. Patient no longer endorsing SI or self-harm once informed about his housing situation.   [GM]    Clinical Course User Index [GM] Lavanna Rog, Jonelle Sports, PA-C     Final Clinical Impressions(s) / ED Diagnoses   Dispo: Home. Patient has been informed of emergency psychiatric services to access should SI arise again. Has care coordinator in place who is aware of patient's ED visit and will connect with the patient at discharge.  After thorough clinical evaluation, this patient is determined to be medically stable and can be safely discharged with the previously mentioned treatment and/or outpatient follow-up/referral(s). At this  time, there are no other apparent medical conditions that require further screening, evaluation or treatment.  Final diagnoses:  Suicidal ideation    ED Discharge Orders    None        Junita Push 03/12/18 1420    Virgel Manifold, MD 03/13/18 1313

## 2018-03-12 NOTE — Progress Notes (Signed)
03/12/18  1015  Labs drawn on patient. Gold, purple, light green and dark green sent to lab.

## 2018-03-12 NOTE — Discharge Instructions (Addendum)
As Noah Delaine has relayed to you, you will not be evicted on Monday. Please stay in touch with your care coordinators about your housing situation and necessary steps needed.  For your medication refills including, please follow-up with Dr. Nancy Fetter.  Thank you for allowing [place] to take care of you today. Be safe!

## 2018-03-12 NOTE — ED Triage Notes (Signed)
03/12/18  0945  Patient states the sheriff came out and served eviction notice on his apartment, he has to many health problem and he can't get out of it. Pt states he was suppose to get transitional housing but if won't be in time. His plan would be to cut his wrist, because he doesn't have a gun.

## 2018-03-18 ENCOUNTER — Ambulatory Visit: Payer: Medicaid Other | Admitting: Occupational Therapy

## 2018-03-28 ENCOUNTER — Emergency Department (HOSPITAL_COMMUNITY)
Admission: EM | Admit: 2018-03-28 | Discharge: 2018-03-28 | Disposition: A | Discharge status: Home / Self Care | Payer: Medicaid Other | Attending: Emergency Medicine | Admitting: Emergency Medicine

## 2018-03-28 ENCOUNTER — Other Ambulatory Visit: Payer: Self-pay

## 2018-03-28 ENCOUNTER — Encounter (HOSPITAL_COMMUNITY): Payer: Self-pay

## 2018-03-28 ENCOUNTER — Emergency Department (HOSPITAL_COMMUNITY): Payer: Medicaid Other

## 2018-03-28 DIAGNOSIS — E860 Dehydration: Secondary | ICD-10-CM | POA: Diagnosis not present

## 2018-03-28 DIAGNOSIS — I10 Essential (primary) hypertension: Secondary | ICD-10-CM | POA: Diagnosis not present

## 2018-03-28 DIAGNOSIS — Z79899 Other long term (current) drug therapy: Secondary | ICD-10-CM | POA: Insufficient documentation

## 2018-03-28 DIAGNOSIS — R1110 Vomiting, unspecified: Secondary | ICD-10-CM | POA: Diagnosis present

## 2018-03-28 LAB — URINALYSIS, ROUTINE W REFLEX MICROSCOPIC
BILIRUBIN URINE: NEGATIVE
Bacteria, UA: NONE SEEN
GLUCOSE, UA: NEGATIVE mg/dL
Ketones, ur: NEGATIVE mg/dL
LEUKOCYTES UA: NEGATIVE
NITRITE: NEGATIVE
PH: 6 (ref 5.0–8.0)
PROTEIN: NEGATIVE mg/dL
Specific Gravity, Urine: 1.005 (ref 1.005–1.030)

## 2018-03-28 LAB — COMPREHENSIVE METABOLIC PANEL
ALBUMIN: 3.7 g/dL (ref 3.5–5.0)
ALT: 16 U/L (ref 0–44)
ANION GAP: 11 (ref 5–15)
AST: 21 U/L (ref 15–41)
Alkaline Phosphatase: 78 U/L (ref 38–126)
BILIRUBIN TOTAL: 0.6 mg/dL (ref 0.3–1.2)
BUN: 5 mg/dL — ABNORMAL LOW (ref 8–23)
CHLORIDE: 98 mmol/L (ref 98–111)
CO2: 26 mmol/L (ref 22–32)
Calcium: 9 mg/dL (ref 8.9–10.3)
Creatinine, Ser: 0.67 mg/dL (ref 0.61–1.24)
GFR calc Af Amer: >60 mL/min (ref >60)
Glucose, Bld: 98 mg/dL (ref 70–99)
POTASSIUM: 4 mmol/L (ref 3.5–5.1)
Sodium: 135 mmol/L (ref 135–145)
TOTAL PROTEIN: 7 g/dL (ref 6.5–8.1)

## 2018-03-28 LAB — DIFFERENTIAL
Abs Immature Granulocytes: 0.02 10*3/uL (ref 0.00–0.07)
Basophils Absolute: 0.1 10*3/uL (ref 0.0–0.1)
Basophils Relative: 1 %
EOS ABS: 0 10*3/uL (ref 0.0–0.5)
EOS PCT: 1 %
Immature Granulocytes: 0 %
LYMPHS ABS: 1 10*3/uL (ref 0.7–4.0)
LYMPHS PCT: 15 %
MONOS PCT: 9 %
Monocytes Absolute: 0.7 10*3/uL (ref 0.1–1.0)
Neutro Abs: 5.2 10*3/uL (ref 1.7–7.7)
Neutrophils Relative %: 74 %

## 2018-03-28 LAB — CBC
HEMATOCRIT: 42.2 % (ref 39.0–52.0)
Hemoglobin: 13.7 g/dL (ref 13.0–17.0)
MCH: 29.9 pg (ref 26.0–34.0)
MCHC: 32.5 g/dL (ref 30.0–36.0)
MCV: 92.1 fL (ref 80.0–100.0)
NRBC: 0 % (ref 0.0–0.2)
Platelets: 301 10*3/uL (ref 150–400)
RBC: 4.58 MIL/uL (ref 4.22–5.81)
RDW: 13.3 % (ref 11.5–15.5)
WBC: 7 10*3/uL (ref 4.0–10.5)

## 2018-03-28 LAB — LIPASE, BLOOD: LIPASE: 32 U/L (ref 11–51)

## 2018-03-28 MED ORDER — ONDANSETRON 4 MG PO TBDP: ORAL_TABLET | ORAL | 0 refills | Status: DC

## 2018-03-28 MED ORDER — SUCRALFATE 1 G PO TABS
1.0000 g | ORAL_TABLET | Freq: Once | ORAL | Status: AC
  Administered 2018-03-28: 1 g via ORAL
  Filled 2018-03-28: qty 1

## 2018-03-28 MED ORDER — FAMOTIDINE IN NACL 20-0.9 MG/50ML-% IV SOLN
20.0000 mg | Freq: Once | INTRAVENOUS | Status: AC
  Administered 2018-03-28: 20 mg via INTRAVENOUS
  Filled 2018-03-28: qty 50

## 2018-03-28 MED ORDER — SODIUM CHLORIDE 0.9 % IV BOLUS
1000.0000 mL | Freq: Once | INTRAVENOUS | Status: AC
  Administered 2018-03-28: 1000 mL via INTRAVENOUS

## 2018-03-28 MED ORDER — PANTOPRAZOLE SODIUM 40 MG PO TBEC
40.0000 mg | DELAYED_RELEASE_TABLET | Freq: Once | ORAL | Status: AC
  Administered 2018-03-28: 40 mg via ORAL
  Filled 2018-03-28: qty 1

## 2018-03-28 MED ORDER — ACETAMINOPHEN 325 MG PO TABS
650.0000 mg | ORAL_TABLET | Freq: Once | ORAL | Status: AC
  Administered 2018-03-28: 650 mg via ORAL
  Filled 2018-03-28: qty 2

## 2018-03-28 MED ORDER — ONDANSETRON HCL 4 MG/2ML IJ SOLN
4.0000 mg | Freq: Once | INTRAMUSCULAR | Status: AC
  Administered 2018-03-28: 4 mg via INTRAVENOUS
  Filled 2018-03-28: qty 2

## 2018-03-28 NOTE — ED Notes (Signed)
ATTEMPT X 2 FOR IV ACCESS WITH BLOOD DRAW NOT SUCCESSFUL. REQUESTING ASSISTANCE

## 2018-03-28 NOTE — ED Notes (Signed)
Bed: WA02 Expected date:  Expected time:  Means of arrival:  Comments: EMS-general malaise

## 2018-03-28 NOTE — Discharge Instructions (Addendum)
Follow-up with your doctor next week if not improving °

## 2018-03-28 NOTE — ED Triage Notes (Signed)
Per [facility]- Pt resides at home. Full Code. Pt c/o of general weakness x 2 days. Nausea with poor appetite for 2 days. Last vomit last night. HX of HTN. Unsure if vomited BP meds last evening. Denies fever, chills, SOB or chest pain. Chronic diarrhea HX of IBS. Chronic GI history.

## 2018-03-28 NOTE — ED Provider Notes (Signed)
[facility] DEPT Provider Note   CSN: [id number] Arrival date & time: 03/28/18  0841     History   Chief Complaint Chief Complaint  Patient presents with  . Weakness  . Nausea  . Dehydration    HPI Anthony Lamb is a 62 y.o. male.  Patient complains of vomiting for couple days.  Patient feels weak dehydrated.  No abdominal pain  The history is provided by the patient. No language interpreter was used.  Weakness  Primary symptoms include no focal weakness. This is a new problem. The current episode started less than 1 hour ago. The problem has not changed since onset.There was no focality noted. There has been no fever (No fever). Associated symptoms include vomiting. Pertinent negatives include no shortness of breath, no chest pain and no headaches. There were no medications administered prior to arrival. Associated medical issues do not include trauma.    Past Medical History:  Diagnosis Date  . Alcohol abuse, in remission   . Anemia   . Ankylosing spondylitis ([place])   . Anxiety   . Chronic diarrhea   . Chronic pain   . Colitis   . Depression   . Esophagitis   . Gastritis   . GERD (gastroesophageal reflux disease)   . Hypertension   . IBS (irritable bowel syndrome)   . Poor dentition   . SIADH (syndrome of inappropriate ADH production) [facility])     Patient Active Problem List   Diagnosis Date Noted  . Alcohol withdrawal ([place]) 02/08/2018  . IBS (irritable bowel syndrome)   . Hypertension   . Colitis   . Adjustment disorder with mixed anxiety and depressed mood 05/11/2017  . Alcohol abuse   . Anxiety   . Gastroesophageal reflux disease   . Osteopenia determined by x-ray 08/06/2014  . Stress fracture of calcaneus 08/06/2014  . Vitamin D deficiency 12/18/2013  . Dementia ([place]) 12/18/2013  . Benign microscopic hematuria 07/06/2013  . SIADH (syndrome of inappropriate ADH production) ([place]) 06/22/2013  . Ankylosing spondylitis  ([place]) 06/20/2013  . Malnutrition of moderate degree ([place]) 06/20/2013  . BPH (benign prostatic hyperplasia) 08/22/2010  . History of colonic polyps 08/13/2008  . Essential hypertension 07/03/2007  . PUD (peptic ulcer disease) 07/03/2007    Past Surgical History:  Procedure Laterality Date  . COLONOSCOPY W/ BIOPSIES    . ESOPHAGOGASTRODUODENOSCOPY    . HERNIA REPAIR Bilateral   . OPEN REDUCTION INTERNAL FIXATION (ORIF) DISTAL RADIAL FRACTURE Right 02/11/2018   Procedure: OPEN REDUCTION INTERNAL FIXATION (ORIF) DISTAL RADIAL FRACTURE;  Surgeon: Milly Jakob, MD;  Location: [facility];  Service: Orthopedics;  Laterality: Right;  . TONSILLECTOMY          Home Medications    Prior to Admission medications   Medication Sig Start Date End Date Taking? Authorizing Provider  acetaminophen (TYLENOL) 500 MG tablet Take 1,000 mg by mouth every 6 (six) hours as needed for moderate pain.   Yes [provider]  clonazePAM (KLONOPIN) 1 MG tablet Take 1 mg by mouth 3 (three) times daily as needed for anxiety.  03/14/18  Yes [provider]  hydrocortisone cream 1 % Apply to affected area 2 times daily Patient taking differently: Apply 1 application topically 2 (two) times daily as needed for itching.  02/14/18  Yes Fawze, Mina A, PA-C  lisinopril (PRINIVIL,ZESTRIL) 10 MG tablet Take 1 tablet (10 mg total) by mouth daily. Patient taking differently: Take 10 mg by mouth at bedtime.  08/21/17 03/28/18  Yes Lacroce, Hulen Shouts, MD  pantoprazole (PROTONIX) 40 MG tablet Take 1 tablet (40 mg total) by mouth daily. 04/24/14  Yes Janith Lima, MD  sodium chloride (OCEAN) 0.65 % SOLN nasal spray Place 1-2 sprays into both nostrils at bedtime as needed for congestion.    Yes [provider]  sucralfate (CARAFATE) 1 G tablet Take 1 tablet (1 g total) by mouth 4 (four) times daily -  with meals and at bedtime. Chew before swallowing 12/17/13  Yes Janith Lima, MD  verapamil (CALAN-SR) 240  MG CR tablet Take 240 mg by mouth at bedtime. 02/09/17  Yes [provider]  zolpidem (AMBIEN) 10 MG tablet Take 10 mg by mouth at bedtime. 03/25/18  Yes [provider]  amoxicillin (AMOXIL) 500 MG capsule Take 1 capsule (500 mg total) by mouth 3 (three) times daily. Patient not taking: Reported on 03/12/2018 02/21/18   [name], MD  chlordiazePOXIDE (LIBRIUM) 25 MG capsule 9m PO TID x 1D, then 25-535mPO BID X 1D, then 25-5077mO QD X 1D Patient not taking: Reported on 02/21/2018 02/14/18   FawRodell Perna PA-C  chlorhexidine (PERIDEX) 0.12 % solution Use as directed 15 mLs in the mouth or throat 2 (two) times daily. Patient not taking: Reported on 03/12/2018 02/21/18   HavIsla PenceD  feeding supplement, ENSURE ENLIVE, (ENSURE ENLIVE) LIQD Take 237 mLs by mouth 3 (three) times daily between meals. Patient not taking: Reported on 02/14/2018 02/12/18   GooSamuella CotaD  folic acid (FOLVITE) 1 MG tablet Take 1 tablet (1 mg total) by mouth daily. Patient not taking: Reported on 03/12/2018 02/12/18   GooSamuella CotaD  ondansetron (ZOFRAN ODT) 4 MG disintegrating tablet 4mg65mT q4 hours prn nausea/vomit 03/28/18   ZammMilton Ferguson  oxyCODONE (OXY IR/ROXICODONE) 5 MG immediate release tablet Take 0-1 tablets (0-5 mg total) by mouth every 6 (six) hours as needed for moderate pain or severe pain (postop pain). Patient not taking: Reported on 03/12/2018 02/11/18   ThomMilly Jakob  thiamine 100 MG tablet Take 1 tablet (100 mg total) by mouth daily. Patient not taking: Reported on 02/14/2018 02/12/18   GoodSamuella Cota    Family History Family History  Problem Relation Age of Onset  . Anxiety disorder Mother   . Congestive Heart Failure Mother   . Crohn's disease Mother   . Colon cancer Mother 60  32Arthritis Father   . High blood pressure Father     Social History Social History   Tobacco Use  . Smoking status: Former Smoker    Types: Cigarettes     Last attempt to quit: 2014    Years since quitting: 5.8  . Smokeless tobacco: Never Used  Substance Use Topics  . Alcohol use: Yes    Comment: daily/ 3-4 beers, has not has any in a few days  . Drug use: No     Allergies   Diphenhydramine hcl; Flexeril [cyclobenzaprine]; Hctz [hydrochlorothiazide]; Nsaids; and Hydroxyzine   Review of Systems Review of Systems  Constitutional: Negative for appetite change and fatigue.  HENT: Negative for congestion, ear discharge and sinus pressure.   Eyes: Negative for discharge.  Respiratory: Negative for cough and shortness of breath.   Cardiovascular: Negative for chest pain.  Gastrointestinal: Positive for vomiting. Negative for abdominal pain and diarrhea.  Genitourinary: Negative for frequency and hematuria.  Musculoskeletal: Negative for back pain.  Skin: Negative for rash.  Neurological: Positive for weakness.  Negative for focal weakness, seizures and headaches.  Psychiatric/Behavioral: Negative for hallucinations.     Physical Exam Updated Vital Signs BP (!) 163/92 (BP Location: Right Arm)   Pulse 83   Temp 98.3 F (36.8 C) (Oral)   Resp 18   Ht 5' 11"  (1.803 m)   Wt 79.4 kg   SpO2 98%   BMI 24.41 kg/m   Physical Exam  Constitutional: He is oriented to person, place, and time. He appears well-developed.  HENT:  Head: Normocephalic.  Mucous membrane dry  Eyes: Conjunctivae and EOM are normal. No scleral icterus.  Neck: Neck supple. No thyromegaly present.  Cardiovascular: Normal rate and regular rhythm. Exam reveals no gallop and no friction rub.  No murmur heard. Pulmonary/Chest: No stridor. He has no wheezes. He has no rales. He exhibits no tenderness.  Abdominal: He exhibits no distension. There is no tenderness. There is no rebound.  Musculoskeletal: Normal range of motion. He exhibits no edema.  Lymphadenopathy:    He has no cervical adenopathy.  Neurological: He is oriented to person, place, and time. He exhibits  normal muscle tone. Coordination normal.  Skin: No rash noted. No erythema.  Psychiatric: He has a normal mood and affect. His behavior is normal.     ED Treatments / Results  Labs (all labs ordered are listed, but only abnormal results are displayed) Labs Reviewed  COMPREHENSIVE METABOLIC PANEL - Abnormal; Notable for the following components:      Result Value   BUN 5 (*)    All other components within normal limits  URINALYSIS, ROUTINE W REFLEX MICROSCOPIC - Abnormal; Notable for the following components:   Color, Urine STRAW (*)    Hgb urine dipstick MODERATE (*)    All other components within normal limits  LIPASE, BLOOD  CBC  DIFFERENTIAL    EKG None  Radiology Dg Abd Acute W/chest  Result Date: 03/28/2018 CLINICAL DATA:  Chest pain and generalized weakness EXAM: DG ABDOMEN ACUTE W/ 1V CHEST COMPARISON:  03/09/2018 FINDINGS: Cardiac shadow is within normal limits. Aortic calcifications are again seen. The lungs are clear bilaterally. No acute bony abnormality is noted. Scattered large and small bowel gas is noted. No abnormal mass or abnormal calcifications are seen. Some soft ankylosing changes in the lumbar spine are noted. No acute bony abnormality is seen. IMPRESSION: Chronic changes without acute abnormality. Electronically Signed   By: Inez Catalina M.D.   On: 03/28/2018 10:23    Procedures Procedures (including critical care time)  Medications Ordered in ED Medications  sodium chloride 0.9 % bolus 1,000 mL (0 mLs Intravenous Stopped 03/28/18 1035)  ondansetron (ZOFRAN) injection 4 mg (4 mg Intravenous Given 03/28/18 0943)  famotidine (PEPCID) IVPB 20 mg premix (0 mg Intravenous Stopped 03/28/18 1015)  pantoprazole (PROTONIX) EC tablet 40 mg (40 mg Oral Given 03/28/18 1152)  sucralfate (CARAFATE) tablet 1 g (1 g Oral Given 03/28/18 1153)  acetaminophen (TYLENOL) tablet 650 mg (650 mg Oral Given 03/28/18 1152)  sodium chloride 0.9 % bolus 1,000 mL (0 mLs  Intravenous Stopped 03/28/18 1254)     Initial Impression / Assessment and Plan / ED Course  I have reviewed the triage vital signs and the nursing notes.  Pertinent labs & imaging results that were available during my care of the patient were reviewed by me and considered in my medical decision making (see chart for details).     Patient with dehydration and vomiting.  Patient improved with fluids and nausea medicine.  Labs  unremarkable.  Patient will be sent home with Zofran and will follow-up with his PCP next week.  He will continue taking his gerd medicine  Final Clinical Impressions(s) / ED Diagnoses   Final diagnoses:  Dehydration    ED Discharge Orders         Ordered    ondansetron (ZOFRAN ODT) 4 MG disintegrating tablet     03/28/18 1433           Milton Ferguson, MD 03/28/18 1439

## 2018-03-28 NOTE — ED Notes (Signed)
Patient transported to X-ray 

## 2018-03-28 NOTE — ED Notes (Signed)
ED Provider at bedside. 

## 2018-04-04 NOTE — Therapy (Signed)
[facility] [address] [place], [place], [address] Phone: [phone]   Fax:  [phone]  Patient Details  Name: Anthony Lamb MRN: [id number] Date of Birth: 26-May-1956 Referring Provider:  Dr. Milly Jakob Encounter Date: 04/04/2018  Pt has not returned since 2nd appointment on 03/11/18. Pt did receive splint, A/ROM and P/ROM HEP's. Pt called today due to strap falling off, however pt is almost 8 weeks post-op and therapist instructed him that he no longer needs to wear splint. Medicaid end date was 03/31/18. Therapist encouraged him to continue previous exercises and begin light strengthening next week with a 1-2 lb. dumbbell for wrist/forearm ex's and stress ball for grip strength. Pt verbalized understanding and agreed to do so. Will d/c episode of care at this time due to not returning and MCD end date expired.   Carey Bullocks, OTR/L 04/04/2018, 11:29 AM  [facility] [address] [place], [place], [address] Phone: [phone]   Fax:  [phone]

## 2018-04-07 ENCOUNTER — Emergency Department (HOSPITAL_COMMUNITY)
Admission: EM | Admit: 2018-04-07 | Discharge: 2018-04-07 | Disposition: A | Discharge status: Home / Self Care | Payer: Medicaid Other | Attending: Emergency Medicine | Admitting: Emergency Medicine

## 2018-04-07 ENCOUNTER — Encounter (HOSPITAL_COMMUNITY): Payer: Self-pay | Admitting: Emergency Medicine

## 2018-04-07 DIAGNOSIS — L03211 Cellulitis of face: Secondary | ICD-10-CM | POA: Diagnosis not present

## 2018-04-07 DIAGNOSIS — I10 Essential (primary) hypertension: Secondary | ICD-10-CM | POA: Insufficient documentation

## 2018-04-07 DIAGNOSIS — Z87891 Personal history of nicotine dependence: Secondary | ICD-10-CM | POA: Diagnosis not present

## 2018-04-07 DIAGNOSIS — R630 Anorexia: Secondary | ICD-10-CM | POA: Insufficient documentation

## 2018-04-07 DIAGNOSIS — Z79899 Other long term (current) drug therapy: Secondary | ICD-10-CM | POA: Insufficient documentation

## 2018-04-07 DIAGNOSIS — R51 Headache: Secondary | ICD-10-CM | POA: Diagnosis present

## 2018-04-07 LAB — I-STAT CHEM 8, ED
BUN: 4 mg/dL — AB (ref 8–23)
CHLORIDE: 96 mmol/L — AB (ref 98–111)
CREATININE: 0.8 mg/dL (ref 0.61–1.24)
Calcium, Ion: 1.11 mmol/L — ABNORMAL LOW (ref 1.15–1.40)
Glucose, Bld: 92 mg/dL (ref 70–99)
HEMATOCRIT: 44 % (ref 39.0–52.0)
Hemoglobin: 15 g/dL (ref 13.0–17.0)
Potassium: 4.5 mmol/L (ref 3.5–5.1)
Sodium: 135 mmol/L (ref 135–145)
TCO2: 31 mmol/L (ref 22–32)

## 2018-04-07 MED ORDER — AMOXICILLIN-POT CLAVULANATE 875-125 MG PO TABS
1.0000 | ORAL_TABLET | Freq: Once | ORAL | Status: AC
  Administered 2018-04-07: 1 via ORAL
  Filled 2018-04-07: qty 1

## 2018-04-07 MED ORDER — AMOXICILLIN-POT CLAVULANATE 875-125 MG PO TABS: 1.0000 | ORAL_TABLET | Freq: Two times a day (BID) | ORAL | 0 refills | Status: DC

## 2018-04-07 MED ORDER — SODIUM CHLORIDE 0.9 % IV BOLUS
500.0000 mL | Freq: Once | INTRAVENOUS | Status: AC
  Administered 2018-04-07: 500 mL via INTRAVENOUS

## 2018-04-07 MED ORDER — LORAZEPAM 1 MG PO TABS
1.0000 mg | ORAL_TABLET | Freq: Once | ORAL | Status: AC
  Administered 2018-04-07: 1 mg via ORAL
  Filled 2018-04-07: qty 1

## 2018-04-07 NOTE — ED Triage Notes (Signed)
Pt continues with poor appetite x several months. Pt states he woke with sinus pain. Pt denies nausea, vomiting, diarrhea.

## 2018-04-07 NOTE — ED Provider Notes (Signed)
[facility] DEPT Provider Note   CSN: [id number] Arrival date & time: 04/07/18  0744     History   Chief Complaint Chief Complaint  Patient presents with  . poor appetite  . Facial Pain    HPI Anthony Lamb is a 62 y.o. male.  The history is provided by the patient. No language interpreter was used.   Anthony Lamb is a 62 y.o. male who presents to the Emergency Department complaining of facial pain, poor appetite.  He presents to the emergency department complaining of facial swelling that began upon waking this morning. He has experienced several days of pain across his maxillary region and noticed today that there was swelling around the right cheek and under the right eye. No fevers. He does endorse poor appetite for several weeks with food version. He states he feels dehydrated and is requesting IV fluids. He denies any fevers, vomiting, chest pain. He has chronic diarrhea and abdominal pain and this is unchanged from his baseline. He denies any leg swelling. He has a history of alcohol abuse and currently drinks three light beers every day. He also has ankylosing spondylitis and hypertension. He is scheduled to follow-up with his family doctor tomorrow. He recently moved and states that he has significant anxiety about moving and access to transportation. Past Medical History:  Diagnosis Date  . Alcohol abuse, in remission   . Anemia   . Ankylosing spondylitis ([place])   . Anxiety   . Chronic diarrhea   . Chronic pain   . Colitis   . Depression   . Esophagitis   . Gastritis   . GERD (gastroesophageal reflux disease)   . Hypertension   . IBS (irritable bowel syndrome)   . Poor dentition   . SIADH (syndrome of inappropriate ADH production) [facility])     Patient Active Problem List   Diagnosis Date Noted  . Alcohol withdrawal ([place]) 02/08/2018  . IBS (irritable bowel syndrome)   . Hypertension   . Colitis   . Adjustment disorder with  mixed anxiety and depressed mood 05/11/2017  . Alcohol abuse   . Anxiety   . Gastroesophageal reflux disease   . Osteopenia determined by x-ray 08/06/2014  . Stress fracture of calcaneus 08/06/2014  . Vitamin D deficiency 12/18/2013  . Dementia ([place]) 12/18/2013  . Benign microscopic hematuria 07/06/2013  . SIADH (syndrome of inappropriate ADH production) ([place]) 06/22/2013  . Ankylosing spondylitis ([place]) 06/20/2013  . Malnutrition of moderate degree ([place]) 06/20/2013  . BPH (benign prostatic hyperplasia) 08/22/2010  . History of colonic polyps 08/13/2008  . Essential hypertension 07/03/2007  . PUD (peptic ulcer disease) 07/03/2007    Past Surgical History:  Procedure Laterality Date  . COLONOSCOPY W/ BIOPSIES    . ESOPHAGOGASTRODUODENOSCOPY    . HERNIA REPAIR Bilateral   . OPEN REDUCTION INTERNAL FIXATION (ORIF) DISTAL RADIAL FRACTURE Right 02/11/2018   Procedure: OPEN REDUCTION INTERNAL FIXATION (ORIF) DISTAL RADIAL FRACTURE;  Surgeon: Milly Jakob, MD;  Location: [facility];  Service: Orthopedics;  Laterality: Right;  . TONSILLECTOMY          Home Medications    Prior to Admission medications   Medication Sig Start Date End Date Taking? Authorizing Provider  acetaminophen (TYLENOL) 500 MG tablet Take 1,000 mg by mouth every 6 (six) hours as needed for moderate pain.    [provider]  amoxicillin (AMOXIL) 500 MG capsule Take 1 capsule (500 mg total) by mouth 3 (three) times daily. Patient not  taking: Reported on 03/12/2018 02/21/18   [name], MD  chlordiazePOXIDE (LIBRIUM) 25 MG capsule 45m PO TID x 1D, then 25-551mPO BID X 1D, then 25-5054mO QD X 1D Patient not taking: Reported on 02/21/2018 02/14/18   FawRodell Perna PA-C  chlorhexidine (PERIDEX) 0.12 % solution Use as directed 15 mLs in the mouth or throat 2 (two) times daily. Patient not taking: Reported on 03/12/2018 02/21/18   HavIsla PenceD  clonazePAM (KLONOPIN) 1 MG tablet Take 1 mg by mouth 3 (three)  times daily as needed for anxiety.  03/14/18   [provider]  feeding supplement, ENSURE ENLIVE, (ENSURE ENLIVE) LIQD Take 237 mLs by mouth 3 (three) times daily between meals. Patient not taking: Reported on 02/14/2018 02/12/18   GooSamuella CotaD  folic acid (FOLVITE) 1 MG tablet Take 1 tablet (1 mg total) by mouth daily. Patient not taking: Reported on 03/12/2018 02/12/18   GooSamuella CotaD  hydrocortisone cream 1 % Apply to affected area 2 times daily Patient taking differently: Apply 1 application topically 2 (two) times daily as needed for itching.  02/14/18   Fawze, Mina A, PA-C  lisinopril (PRINIVIL,ZESTRIL) 10 MG tablet Take 1 tablet (10 mg total) by mouth daily. Patient taking differently: Take 10 mg by mouth at bedtime.  08/21/17 03/28/18  LacMelanee SpryD  ondansetron (ZOFRAN ODT) 4 MG disintegrating tablet 4mg24mT q4 hours prn nausea/vomit 03/28/18   ZammMilton Ferguson  oxyCODONE (OXY IR/ROXICODONE) 5 MG immediate release tablet Take 0-1 tablets (0-5 mg total) by mouth every 6 (six) hours as needed for moderate pain or severe pain (postop pain). Patient not taking: Reported on 03/12/2018 02/11/18   ThomMilly Jakob  pantoprazole (PROTONIX) 40 MG tablet Take 1 tablet (40 mg total) by mouth daily. 04/24/14   JoneJanith Lima  sodium chloride (OCEAN) 0.65 % SOLN nasal spray Place 1-2 sprays into both nostrils at bedtime as needed for congestion.     [provider]  sucralfate (CARAFATE) 1 G tablet Take 1 tablet (1 g total) by mouth 4 (four) times daily -  with meals and at bedtime. Chew before swallowing 12/17/13   JoneJanith Lima  thiamine 100 MG tablet Take 1 tablet (100 mg total) by mouth daily. Patient not taking: Reported on 02/14/2018 02/12/18   GoodSamuella Cota  verapamil (CALAN-SR) 240 MG CR tablet Take 240 mg by mouth at bedtime. 02/09/17   [provider]  zolpidem (AMBIEN) 10 MG tablet Take 10 mg by mouth at bedtime. 03/25/18    [provider]    Family History Family History  Problem Relation Age of Onset  . Anxiety disorder Mother   . Congestive Heart Failure Mother   . Crohn's disease Mother   . Colon cancer Mother 60  29Arthritis Father   . High blood pressure Father     Social History Social History   Tobacco Use  . Smoking status: Former Smoker    Types: Cigarettes    Last attempt to quit: 2014    Years since quitting: 5.8  . Smokeless tobacco: Never Used  Substance Use Topics  . Alcohol use: Yes    Comment: daily/ 3-4 beers, has not has any in a few days  . Drug use: No     Allergies   Diphenhydramine hcl; Flexeril [cyclobenzaprine]; Hctz [hydrochlorothiazide]; Nsaids; and Hydroxyzine   Review of Systems Review of Systems  All other systems reviewed  and are negative.    Physical Exam Updated Vital Signs BP (!) 162/88 (BP Location: Right Arm)   Pulse 78   Temp 97.9 F (36.6 C) (Oral)   Resp 16   SpO2 97%   Physical Exam  Constitutional: He is oriented to person, place, and time. He appears well-developed and well-nourished.  HENT:  Head: Normocephalic and atraumatic.  TMs clear bilaterally. Extensive poor dentition with most teeth fractured at the gum line. There is generalized gingival induration. There is mild erythema and edema as well as tenderness over the right maxillary cheek and in the right inferior periorbital region. There is no focal fluctuance.  Eyes: Pupils are equal, round, and reactive to light. EOM are normal.  Neck: Neck supple.  Cardiovascular: Normal rate and regular rhythm.  No murmur heard. Pulmonary/Chest: Effort normal and breath sounds normal. No respiratory distress.  Abdominal: Soft. There is no tenderness. There is no rebound and no guarding.  Musculoskeletal: He exhibits no edema or tenderness.  Neurological: He is alert and oriented to person, place, and time.  Cranial nerves are grossly intact. No pronator drift. Five out of five  strength in all four extremities.  Visual fields grossly intact.  Skin: Skin is warm and dry.  Psychiatric: He has a normal mood and affect. His behavior is normal.  Nursing note and vitals reviewed.    ED Treatments / Results  Labs (all labs ordered are listed, but only abnormal results are displayed) Labs Reviewed  I-STAT CHEM 8, ED    EKG None  Radiology No results found.  Procedures Procedures (including critical care time)  Medications Ordered in ED Medications  sodium chloride 0.9 % bolus 500 mL (has no administration in time range)  amoxicillin-clavulanate (AUGMENTIN) 875-125 MG per tablet 1 tablet (has no administration in time range)  LORazepam (ATIVAN) tablet 1 mg (has no administration in time range)     Initial Impression / Assessment and Plan / ED Course  I have reviewed the triage vital signs and the nursing notes.  Pertinent labs & imaging results that were available during my care of the patient were reviewed by me and considered in my medical decision making (see chart for details).     Patient with history of alcohol abuse, anxiety, ankylosing spondylitis here for evaluation of facial pain, poor appetite. He is non-toxic appearing on examination. Examination is consistent with cellulitis, no evidence of abscess or orbital cellulitis. He has a possible odontogenic source given his extensive poor dentition. Will treat with antibiotics. He is a very poor historian. Will check I stat chem8 to evaluate for significant electrolyte arrangements given his history of SI ADH.  Final Clinical Impressions(s) / ED Diagnoses   Final diagnoses:  None    ED Discharge Orders    None       Quintella Reichert, MD 04/07/18 [id number]

## 2018-04-17 ENCOUNTER — Emergency Department (HOSPITAL_COMMUNITY): Payer: Medicaid Other

## 2018-04-17 ENCOUNTER — Encounter (HOSPITAL_COMMUNITY): Payer: Self-pay | Admitting: Emergency Medicine

## 2018-04-17 ENCOUNTER — Other Ambulatory Visit: Payer: Self-pay

## 2018-04-17 ENCOUNTER — Emergency Department (HOSPITAL_COMMUNITY)
Admission: EM | Admit: 2018-04-17 | Discharge: 2018-04-17 | Disposition: A | Discharge status: Home / Self Care | Payer: Medicaid Other | Attending: Emergency Medicine | Admitting: Emergency Medicine

## 2018-04-17 DIAGNOSIS — Z79899 Other long term (current) drug therapy: Secondary | ICD-10-CM | POA: Insufficient documentation

## 2018-04-17 DIAGNOSIS — R319 Hematuria, unspecified: Secondary | ICD-10-CM | POA: Insufficient documentation

## 2018-04-17 DIAGNOSIS — Z87891 Personal history of nicotine dependence: Secondary | ICD-10-CM | POA: Diagnosis not present

## 2018-04-17 DIAGNOSIS — R110 Nausea: Secondary | ICD-10-CM | POA: Insufficient documentation

## 2018-04-17 DIAGNOSIS — E860 Dehydration: Secondary | ICD-10-CM | POA: Diagnosis present

## 2018-04-17 DIAGNOSIS — R197 Diarrhea, unspecified: Secondary | ICD-10-CM | POA: Diagnosis not present

## 2018-04-17 DIAGNOSIS — I10 Essential (primary) hypertension: Secondary | ICD-10-CM | POA: Insufficient documentation

## 2018-04-17 LAB — CBC WITH DIFFERENTIAL/PLATELET
Abs Immature Granulocytes: 0.03 10*3/uL (ref 0.00–0.07)
BASOS ABS: 0.1 10*3/uL (ref 0.0–0.1)
Basophils Relative: 1 %
EOS PCT: 1 %
Eosinophils Absolute: 0 10*3/uL (ref 0.0–0.5)
HEMATOCRIT: 40.6 % (ref 39.0–52.0)
Hemoglobin: 13.6 g/dL (ref 13.0–17.0)
Immature Granulocytes: 0 %
Lymphocytes Relative: 16 %
Lymphs Abs: 1.3 10*3/uL (ref 0.7–4.0)
MCH: 31 pg (ref 26.0–34.0)
MCHC: 33.5 g/dL (ref 30.0–36.0)
MCV: 92.5 fL (ref 80.0–100.0)
MONO ABS: 0.8 10*3/uL (ref 0.1–1.0)
Monocytes Relative: 10 %
NRBC: 0 % (ref 0.0–0.2)
Neutro Abs: 5.9 10*3/uL (ref 1.7–7.7)
Neutrophils Relative %: 72 %
Platelets: 334 10*3/uL (ref 150–400)
RBC: 4.39 MIL/uL (ref 4.22–5.81)
RDW: 12.6 % (ref 11.5–15.5)
WBC: 8.1 10*3/uL (ref 4.0–10.5)

## 2018-04-17 LAB — URINALYSIS, ROUTINE W REFLEX MICROSCOPIC
Bacteria, UA: NONE SEEN
Bilirubin Urine: NEGATIVE
GLUCOSE, UA: NEGATIVE mg/dL
KETONES UR: NEGATIVE mg/dL
LEUKOCYTES UA: NEGATIVE
Nitrite: NEGATIVE
PH: 7 (ref 5.0–8.0)
Protein, ur: NEGATIVE mg/dL
Specific Gravity, Urine: 1.005 (ref 1.005–1.030)

## 2018-04-17 LAB — COMPREHENSIVE METABOLIC PANEL
ALBUMIN: 3.8 g/dL (ref 3.5–5.0)
ALK PHOS: 82 U/L (ref 38–126)
ALT: 17 U/L (ref 0–44)
ANION GAP: 8 (ref 5–15)
AST: 23 U/L (ref 15–41)
BILIRUBIN TOTAL: 0.7 mg/dL (ref 0.3–1.2)
BUN: 6 mg/dL — AB (ref 8–23)
CO2: 27 mmol/L (ref 22–32)
Calcium: 8.6 mg/dL — ABNORMAL LOW (ref 8.9–10.3)
Chloride: 95 mmol/L — ABNORMAL LOW (ref 98–111)
Creatinine, Ser: 0.72 mg/dL (ref 0.61–1.24)
GFR calc non Af Amer: >60 mL/min (ref >60)
Glucose, Bld: 100 mg/dL — ABNORMAL HIGH (ref 70–99)
POTASSIUM: 4 mmol/L (ref 3.5–5.1)
SODIUM: 130 mmol/L — AB (ref 135–145)
Total Protein: 7.2 g/dL (ref 6.5–8.1)

## 2018-04-17 LAB — LIPASE, BLOOD: LIPASE: 29 U/L (ref 11–51)

## 2018-04-17 MED ORDER — ONDANSETRON 4 MG PO TBDP: ORAL_TABLET | ORAL | 0 refills | Status: DC

## 2018-04-17 MED ORDER — SODIUM CHLORIDE 0.9 % IV BOLUS
1000.0000 mL | Freq: Once | INTRAVENOUS | Status: AC
  Administered 2018-04-17: 1000 mL via INTRAVENOUS

## 2018-04-17 MED ORDER — TRAMADOL HCL 50 MG PO TABS
50.0000 mg | ORAL_TABLET | Freq: Once | ORAL | Status: AC
  Administered 2018-04-17: 50 mg via ORAL
  Filled 2018-04-17: qty 1

## 2018-04-17 MED ORDER — ONDANSETRON HCL 4 MG/2ML IJ SOLN
4.0000 mg | Freq: Once | INTRAMUSCULAR | Status: AC
  Administered 2018-04-17: 4 mg via INTRAVENOUS
  Filled 2018-04-17: qty 2

## 2018-04-17 MED ORDER — LORAZEPAM 1 MG PO TABS
1.0000 mg | ORAL_TABLET | Freq: Once | ORAL | Status: AC
  Administered 2018-04-17: 1 mg via ORAL
  Filled 2018-04-17: qty 1

## 2018-04-17 MED ORDER — TRAMADOL HCL 50 MG PO TABS: 50.0000 mg | ORAL_TABLET | Freq: Four times a day (QID) | ORAL | 0 refills | Status: DC | PRN

## 2018-04-17 NOTE — ED Notes (Signed)
Pt  went to the bathroom and said he could not urinate

## 2018-04-17 NOTE — ED Provider Notes (Signed)
[place] DEPT Provider Note   CSN: [id number] Arrival date & time: 04/17/18  2297     History   Chief Complaint Chief Complaint  Patient presents with  . Dehydration    HPI Anthony Lamb is a 62 y.o. male.  Patient complains of vomiting and diarrhea.  He states that he is dehydrated.  Patient vomited a few times yesterday but none today  The history is provided by the patient. No language interpreter was used.  Weakness  Primary symptoms include no focal weakness. This is a new problem. The current episode started more than 2 days ago. The problem has not changed since onset.There was no focality noted. There has been no fever. Associated symptoms include vomiting. Pertinent negatives include no shortness of breath, no chest pain and no headaches. There were no medications administered prior to arrival. Associated medical issues do not include trauma.    Past Medical History:  Diagnosis Date  . Alcohol abuse, in remission   . Anemia   . Ankylosing spondylitis ([place])   . Anxiety   . Chronic diarrhea   . Chronic pain   . Colitis   . Depression   . Esophagitis   . Gastritis   . GERD (gastroesophageal reflux disease)   . Hypertension   . IBS (irritable bowel syndrome)   . Poor dentition   . SIADH (syndrome of inappropriate ADH production) [facility])     Patient Active Problem List   Diagnosis Date Noted  . Alcohol withdrawal ([place]) 02/08/2018  . IBS (irritable bowel syndrome)   . Hypertension   . Colitis   . Adjustment disorder with mixed anxiety and depressed mood 05/11/2017  . Alcohol abuse   . Anxiety   . Gastroesophageal reflux disease   . Osteopenia determined by x-ray 08/06/2014  . Stress fracture of calcaneus 08/06/2014  . Vitamin D deficiency 12/18/2013  . Dementia ([place]) 12/18/2013  . Benign microscopic hematuria 07/06/2013  . SIADH (syndrome of inappropriate ADH production) ([place]) 06/22/2013  . Ankylosing spondylitis  ([place]) 06/20/2013  . Malnutrition of moderate degree ([employer]) 06/20/2013  . BPH (benign prostatic hyperplasia) 08/22/2010  . History of colonic polyps 08/13/2008  . Essential hypertension 07/03/2007  . PUD (peptic ulcer disease) 07/03/2007    Past Surgical History:  Procedure Laterality Date  . COLONOSCOPY W/ BIOPSIES    . ESOPHAGOGASTRODUODENOSCOPY    . HERNIA REPAIR Bilateral   . OPEN REDUCTION INTERNAL FIXATION (ORIF) DISTAL RADIAL FRACTURE Right 02/11/2018   Procedure: OPEN REDUCTION INTERNAL FIXATION (ORIF) DISTAL RADIAL FRACTURE;  Surgeon: Milly Jakob, MD;  Location: [facility];  Service: Orthopedics;  Laterality: Right;  . TONSILLECTOMY          Home Medications    Prior to Admission medications   Medication Sig Start Date End Date Taking? Authorizing Provider  acetaminophen (TYLENOL) 500 MG tablet Take 1,000 mg by mouth every 6 (six) hours as needed for moderate pain.   Yes [provider]  amoxicillin-clavulanate (AUGMENTIN) 875-125 MG tablet Take 1 tablet by mouth every 12 (twelve) hours. 04/07/18  Yes Quintella Reichert, MD  clonazePAM (KLONOPIN) 1 MG tablet Take 1 mg by mouth 3 (three) times daily as needed for anxiety.  03/14/18  Yes [provider]  lisinopril (PRINIVIL,ZESTRIL) 10 MG tablet Take 1 tablet (10 mg total) by mouth daily. 08/21/17 04/17/18 Yes Lacroce, Hulen Shouts, MD  pantoprazole (PROTONIX) 40 MG tablet Take 1 tablet (40 mg total) by mouth daily. 04/24/14  Yes Janith Lima,  MD  sodium chloride (OCEAN) 0.65 % SOLN nasal spray Place 1 spray into both nostrils as needed for congestion.   Yes [provider]  sucralfate (CARAFATE) 1 G tablet Take 1 tablet (1 g total) by mouth 4 (four) times daily -  with meals and at bedtime. Chew before swallowing 12/17/13  Yes Janith Lima, MD  verapamil (CALAN-SR) 240 MG CR tablet Take 240 mg by mouth at bedtime. 02/09/17  Yes [provider]  zolpidem (AMBIEN) 10 MG tablet Take 10 mg by mouth at  bedtime. 04/13/18  Yes [provider]  chlordiazePOXIDE (LIBRIUM) 25 MG capsule 77m PO TID x 1D, then 25-527mPO BID X 1D, then 25-5055mO QD X 1D Patient not taking: Reported on 02/21/2018 02/14/18   FawRodell Perna PA-C  folic acid (FOLVITE) 1 MG tablet Take 1 tablet (1 mg total) by mouth daily. Patient not taking: Reported on 03/12/2018 02/12/18   GooSamuella CotaD  ondansetron (ZOFRAN ODT) 4 MG disintegrating tablet 4mg37mT q4 hours prn nausea/vomit 04/17/18   ZammMilton Ferguson  thiamine 100 MG tablet Take 1 tablet (100 mg total) by mouth daily. Patient not taking: Reported on 02/14/2018 02/12/18   GoodSamuella Cota  traMADol (ULTRAM) 50 MG tablet Take 1 tablet (50 mg total) by mouth every 6 (six) hours as needed. 04/17/18   ZammMilton Ferguson    Family History Family History  Problem Relation Age of Onset  . Anxiety disorder Mother   . Congestive Heart Failure Mother   . Crohn's disease Mother   . Colon cancer Mother 60  52Arthritis Father   . High blood pressure Father     Social History Social History   Tobacco Use  . Smoking status: Former Smoker    Types: Cigarettes    Last attempt to quit: 2014    Years since quitting: 5.8  . Smokeless tobacco: Never Used  Substance Use Topics  . Alcohol use: Yes    Comment: daily/ 3-4 beers, has not has any in a few days  . Drug use: No     Allergies   Diphenhydramine hcl; Flexeril [cyclobenzaprine]; Hctz [hydrochlorothiazide]; Nsaids; and Hydroxyzine   Review of Systems Review of Systems  Constitutional: Negative for appetite change and fatigue.  HENT: Negative for congestion, ear discharge and sinus pressure.   Eyes: Negative for discharge.  Respiratory: Negative for cough and shortness of breath.   Cardiovascular: Negative for chest pain.  Gastrointestinal: Positive for diarrhea and vomiting. Negative for abdominal pain.  Genitourinary: Negative for frequency and hematuria.  Musculoskeletal: Negative for  back pain.  Skin: Negative for rash.  Neurological: Positive for weakness. Negative for focal weakness, seizures and headaches.  Psychiatric/Behavioral: Negative for hallucinations.     Physical Exam Updated Vital Signs BP (!) 166/84   Pulse 70   Temp 98 F (36.7 C) (Oral)   Resp (!) 22   Ht 5' 11"  (1.803 m)   Wt 77.1 kg   SpO2 98%   BMI 23.71 kg/m   Physical Exam  Constitutional: He is oriented to person, place, and time. He appears well-developed.  HENT:  Head: Normocephalic.  Eyes: Conjunctivae and EOM are normal. No scleral icterus.  Neck: Neck supple. No thyromegaly present.  Cardiovascular: Normal rate and regular rhythm. Exam reveals no gallop and no friction rub.  No murmur heard. Pulmonary/Chest: No stridor. He has no wheezes. He has no rales. He exhibits no tenderness.  Abdominal: He exhibits no  distension. There is tenderness. There is no rebound.  Minimal tenderness left lower and right lower quadrant  Musculoskeletal: Normal range of motion. He exhibits no edema.  Lymphadenopathy:    He has no cervical adenopathy.  Neurological: He is oriented to person, place, and time. He exhibits normal muscle tone. Coordination normal.  Skin: No rash noted. No erythema.  Psychiatric: He has a normal mood and affect. His behavior is normal.     ED Treatments / Results  Labs (all labs ordered are listed, but only abnormal results are displayed) Labs Reviewed  COMPREHENSIVE METABOLIC PANEL - Abnormal; Notable for the following components:      Result Value   Sodium 130 (*)    Chloride 95 (*)    Glucose, Bld 100 (*)    BUN 6 (*)    Calcium 8.6 (*)    All other components within normal limits  URINALYSIS, ROUTINE W REFLEX MICROSCOPIC - Abnormal; Notable for the following components:   Hgb urine dipstick SMALL (*)    All other components within normal limits  URINE CULTURE  CBC WITH DIFFERENTIAL/PLATELET  LIPASE, BLOOD    EKG None  Radiology Ct Renal Stone  Study  Result Date: 04/17/2018 CLINICAL DATA:  Flank region pain.  Dehydration EXAM: CT ABDOMEN AND PELVIS WITHOUT CONTRAST TECHNIQUE: Multidetector CT imaging of the abdomen and pelvis was performed following the standard protocol without oral or IV contrast. COMPARISON:  June 11, 2017 FINDINGS: Lower chest: There is scarring and atelectatic change in the left base region. There are foci of coronary artery calcification. Hepatobiliary: No focal liver lesions are appreciable on this noncontrast enhanced study. Gallbladder wall is not appreciably thickened. There is no biliary duct dilatation. Pancreas: There is no evident pancreatic mass or inflammatory focus. Spleen: No splenic lesions are evident. Adrenals/Urinary Tract: Right adrenal appears normal. There is stable left adrenal hypertrophy. Kidneys bilaterally show no evident mass or hydronephrosis on either side. There is no evident renal or ureteral calculus on either side. Urinary bladder is midline with wall thickness within normal limits. Stomach/Bowel: There are scattered sigmoid diverticula without diverticulitis. There is no appreciable bowel wall or mesenteric thickening. No evident bowel obstruction. No free air or portal venous air. There is mild lipomatous infiltration of the ileocecal valve. Vascular/Lymphatic: There is no abdominal aortic aneurysm. There is extensive aortoiliac atherosclerosis. Major mesenteric arterial vessels appear patent on this noncontrast enhanced study. There is no appreciable adenopathy in the abdomen or pelvis. Reproductive: There are prostatic calculi. The prostate abuts the inferior aspect of the bladder with loss of fat plane between the superior prostate and inferior bladder. Prostate and seminal vesicles do not appear appreciably enlarged. No pelvic mass is demonstrated. Other: Appendix appears unremarkable. No evident abscess or ascites in the abdomen or pelvis. Minimal ventral hernia present containing only fat.  Musculoskeletal: Bones are osteoporotic. There is sacroiliitis bilaterally. There is widespread syndesmophytic change throughout the lower thoracic and lumbar regions with areas of bony fusion posteriorly as well. These are changes indicative of known ankylosing spondylitis. There are no blastic or lytic bone lesions. No intramuscular lesions are evident. IMPRESSION: 1. Sigmoid diverticulosis without diverticulitis. No evident bowel obstruction. No abscess in the abdomen or pelvis. Appendix appears normal. 2. Changes of ankylosing spondylitis again noted. Underlying osteoporosis. 3. Prostatic calculi noted. Prostate abuts the inferior urinary bladder with loss of fat plane between the prostate and bladder. This finding warrants clinical assessment and PSA assessment given possible neoplastic involvement of the prostate in  this circumstance. 4.  No evident renal or ureteral calculus.  No hydronephrosis. 5.  Stable left adrenal hypertrophy. 6. Aortoiliac atherosclerosis. Foci coronary artery calcification noted. 7.  Minimal ventral hernia containing only fat. Aortic Atherosclerosis (ICD10-I70.0). Electronically Signed   By: Lowella Grip III M.D.   On: 04/17/2018 10:13    Procedures Procedures (including critical care time)  Medications Ordered in ED Medications  traMADol (ULTRAM) tablet 50 mg (has no administration in time range)  sodium chloride 0.9 % bolus 1,000 mL (0 mLs Intravenous Stopped 04/17/18 0931)  ondansetron (ZOFRAN) injection 4 mg (4 mg Intravenous Given 04/17/18 0804)  LORazepam (ATIVAN) tablet 1 mg (1 mg Oral Given 04/17/18 0937)     Initial Impression / Assessment and Plan / ED Course  I have reviewed the triage vital signs and the nursing notes.  Pertinent labs & imaging results that were available during my care of the patient were reviewed by me and considered in my medical decision making (see chart for details).    Labs show hematuria and CT scan shows enlarged prostate.   Patient will have urine culture and will follow-up with urology he will also be given some nausea and pain medicine will follow-up with his PCP  Final Clinical Impressions(s) / ED Diagnoses   Final diagnoses:  Hematuria syndrome    ED Discharge Orders         Ordered    ondansetron (ZOFRAN ODT) 4 MG disintegrating tablet     04/17/18 1025    traMADol (ULTRAM) 50 MG tablet  Every 6 hours PRN     04/17/18 1025           Milton Ferguson, MD 04/17/18 1028

## 2018-04-17 NOTE — Discharge Instructions (Signed)
Follow-up with [facility] in the next couple weeks.  Also follow-up with your family doctor.

## 2018-04-17 NOTE — ED Triage Notes (Signed)
Patietn BIB [facility] from home c/o dehydration. Pt reports to EMS " I need an IV and blood work". Pt told EMS " I am too dehydrated to watch tv." pt reports poor intake since Sunday but reports last drink of alcohol was last night. Pt stated to EMS that he didn't want to got to his PCP bc he owes them $30.

## 2018-04-17 NOTE — ED Notes (Signed)
Patient states his rt wrist hurts after pushing up to sit on the edge of the bed. Pt concerned bc he had surgery on it a few months ago. EDP made aware and will examine pts wrist.

## 2018-04-17 NOTE — ED Notes (Signed)
Bed: WA20 Expected date:  Expected time:  Means of arrival:  Comments: Ems 62 yo old dehydration, loss of appetite

## 2018-04-18 LAB — URINE CULTURE
CULTURE: NO GROWTH
Special Requests: NORMAL

## 2018-05-15 ENCOUNTER — Emergency Department (HOSPITAL_COMMUNITY): Payer: Medicaid Other

## 2018-05-15 ENCOUNTER — Emergency Department (HOSPITAL_COMMUNITY)
Admission: EM | Admit: 2018-05-15 | Discharge: 2018-05-15 | Disposition: A | Discharge status: Home / Self Care | Payer: Medicaid Other | Attending: Emergency Medicine | Admitting: Emergency Medicine

## 2018-05-15 ENCOUNTER — Encounter (HOSPITAL_COMMUNITY): Payer: Self-pay

## 2018-05-15 ENCOUNTER — Other Ambulatory Visit: Payer: Self-pay

## 2018-05-15 DIAGNOSIS — R630 Anorexia: Secondary | ICD-10-CM | POA: Diagnosis not present

## 2018-05-15 DIAGNOSIS — R51 Headache: Secondary | ICD-10-CM | POA: Diagnosis present

## 2018-05-15 DIAGNOSIS — Z79899 Other long term (current) drug therapy: Secondary | ICD-10-CM | POA: Insufficient documentation

## 2018-05-15 DIAGNOSIS — Z87891 Personal history of nicotine dependence: Secondary | ICD-10-CM | POA: Insufficient documentation

## 2018-05-15 DIAGNOSIS — F0390 Unspecified dementia without behavioral disturbance: Secondary | ICD-10-CM | POA: Insufficient documentation

## 2018-05-15 DIAGNOSIS — K029 Dental caries, unspecified: Secondary | ICD-10-CM | POA: Insufficient documentation

## 2018-05-15 DIAGNOSIS — I10 Essential (primary) hypertension: Secondary | ICD-10-CM | POA: Diagnosis not present

## 2018-05-15 LAB — COMPREHENSIVE METABOLIC PANEL
ALBUMIN: 3.9 g/dL (ref 3.5–5.0)
ALT: 22 U/L (ref 0–44)
AST: 25 U/L (ref 15–41)
Alkaline Phosphatase: 80 U/L (ref 38–126)
Anion gap: 9 (ref 5–15)
BUN: 5 mg/dL — ABNORMAL LOW (ref 8–23)
CHLORIDE: 99 mmol/L (ref 98–111)
CO2: 26 mmol/L (ref 22–32)
CREATININE: 0.7 mg/dL (ref 0.61–1.24)
Calcium: 8.8 mg/dL — ABNORMAL LOW (ref 8.9–10.3)
GFR calc non Af Amer: >60 mL/min (ref >60)
Glucose, Bld: 125 mg/dL — ABNORMAL HIGH (ref 70–99)
POTASSIUM: 3.8 mmol/L (ref 3.5–5.1)
SODIUM: 134 mmol/L — AB (ref 135–145)
Total Bilirubin: 0.7 mg/dL (ref 0.3–1.2)
Total Protein: 6.7 g/dL (ref 6.5–8.1)

## 2018-05-15 LAB — CBC WITH DIFFERENTIAL/PLATELET
Abs Immature Granulocytes: 0.06 10*3/uL (ref 0.00–0.07)
BASOS ABS: 0.1 10*3/uL (ref 0.0–0.1)
BASOS PCT: 1 %
EOS ABS: 0.1 10*3/uL (ref 0.0–0.5)
Eosinophils Relative: 0 %
HEMATOCRIT: 41.8 % (ref 39.0–52.0)
Hemoglobin: 13.9 g/dL (ref 13.0–17.0)
Immature Granulocytes: 0 %
Lymphocytes Relative: 9 %
Lymphs Abs: 1.2 10*3/uL (ref 0.7–4.0)
MCH: 30.7 pg (ref 26.0–34.0)
MCHC: 33.3 g/dL (ref 30.0–36.0)
MCV: 92.3 fL (ref 80.0–100.0)
MONO ABS: 1.2 10*3/uL — AB (ref 0.1–1.0)
Monocytes Relative: 9 %
NRBC: 0 % (ref 0.0–0.2)
Neutro Abs: 11 10*3/uL — ABNORMAL HIGH (ref 1.7–7.7)
Neutrophils Relative %: 81 %
PLATELETS: 283 10*3/uL (ref 150–400)
RBC: 4.53 MIL/uL (ref 4.22–5.81)
RDW: 12.3 % (ref 11.5–15.5)
WBC: 13.6 10*3/uL — ABNORMAL HIGH (ref 4.0–10.5)

## 2018-05-15 LAB — URINALYSIS, ROUTINE W REFLEX MICROSCOPIC
BILIRUBIN URINE: NEGATIVE
Bacteria, UA: NONE SEEN
GLUCOSE, UA: NEGATIVE mg/dL
KETONES UR: NEGATIVE mg/dL
LEUKOCYTES UA: NEGATIVE
Nitrite: NEGATIVE
Protein, ur: NEGATIVE mg/dL
Specific Gravity, Urine: 1.003 — ABNORMAL LOW (ref 1.005–1.030)
pH: 7 (ref 5.0–8.0)

## 2018-05-15 MED ORDER — SODIUM CHLORIDE 0.9 % IV BOLUS
1000.0000 mL | Freq: Once | INTRAVENOUS | Status: AC
  Administered 2018-05-15: 1000 mL via INTRAVENOUS

## 2018-05-15 MED ORDER — ACETAMINOPHEN 325 MG PO TABS
650.0000 mg | ORAL_TABLET | Freq: Once | ORAL | Status: AC
  Administered 2018-05-15: 650 mg via ORAL
  Filled 2018-05-15: qty 2

## 2018-05-15 MED ORDER — CLINDAMYCIN HCL 150 MG PO CAPS: 450.0000 mg | ORAL_CAPSULE | Freq: Three times a day (TID) | ORAL | 0 refills | Status: DC

## 2018-05-15 MED ORDER — CLINDAMYCIN HCL 300 MG PO CAPS
450.0000 mg | ORAL_CAPSULE | Freq: Once | ORAL | Status: AC
  Administered 2018-05-15: 450 mg via ORAL
  Filled 2018-05-15: qty 1

## 2018-05-15 NOTE — ED Notes (Signed)
Pt made aware urine needed. Pt provided with urinal. Call bell within reach.

## 2018-05-15 NOTE — ED Notes (Signed)
Patient transported to X-ray 

## 2018-05-15 NOTE — ED Triage Notes (Signed)
Pt arrives via [facility] from home. Pt c/o of headache secondary to dehydration. Pt seen multiple times in the past for the same. Pt reports to EMS: I just cant eat or drink. Pt working with SW to obtain help with dental resources.

## 2018-05-15 NOTE — ED Notes (Signed)
Pt requesting pain medication and home anxiety medication. MD made aware.

## 2018-05-15 NOTE — Progress Notes (Signed)
CSW aware of consult for resources for patient. CSW familiar with patient from previous visits. CSW went to speak with patient at bedside who asked why they had consulted CSW. Per patient, he is active with [facility] CST team and that they are working with patient presently. Per patient, he has moved into a new apartment and is still being followed by his Care Coordinator with [facility]. Patient expresses his only concern at this time is assistance with transportation. CSW to assist once patient is medically cleared for discharge.   Ollen Barges, [place] Work Department  [employer]  [phone]

## 2018-05-15 NOTE — ED Provider Notes (Signed)
[place] DEPT Provider Note   CSN: [id number] Arrival date & time: 05/15/18  5956     History   Chief Complaint Chief Complaint  Patient presents with  . Headache    HPI Anthony Lamb is a 62 y.o. male.  The history is provided by the patient. No language interpreter was used.   Anthony Lamb is a 62 y.o. male who presents to the Emergency Department complaining of dehydration.  Hx is limited secondary to vague historian. He states that he is coming from home with complaints of dehydration. He has poor appetite due to dental pain. He reports of migratory headaches for the last week. He was started on trazodone one week ago and has associated night sweats since beginning the medication. He also states that he is not sleeping well. He does have mild cough. He has nausea but no vomiting. He has mild abdominal discomfort. No dysuria. He has chronic diarrhea, unchanged from baseline. No hematochezia or melena. No fevers. He states that he is compliant with his home medications. He does not want to drink any fluids outside of three 12 ounce cans of beer daily. Past Medical History:  Diagnosis Date  . Alcohol abuse, in remission   . Anemia   . Ankylosing spondylitis ([place])   . Anxiety   . Chronic diarrhea   . Chronic pain   . Colitis   . Depression   . Esophagitis   . Gastritis   . GERD (gastroesophageal reflux disease)   . Hypertension   . IBS (irritable bowel syndrome)   . Poor dentition   . SIADH (syndrome of inappropriate ADH production) [facility])     Patient Active Problem List   Diagnosis Date Noted  . Alcohol withdrawal ([place]) 02/08/2018  . IBS (irritable bowel syndrome)   . Hypertension   . Colitis   . Adjustment disorder with mixed anxiety and depressed mood 05/11/2017  . Alcohol abuse   . Anxiety   . Gastroesophageal reflux disease   . Osteopenia determined by x-ray 08/06/2014  . Stress fracture of calcaneus 08/06/2014  .  Vitamin D deficiency 12/18/2013  . Dementia ([place]) 12/18/2013  . Benign microscopic hematuria 07/06/2013  . SIADH (syndrome of inappropriate ADH production) ([place]) 06/22/2013  . Ankylosing spondylitis ([place]) 06/20/2013  . Malnutrition of moderate degree ([place]) 06/20/2013  . BPH (benign prostatic hyperplasia) 08/22/2010  . History of colonic polyps 08/13/2008  . Essential hypertension 07/03/2007  . PUD (peptic ulcer disease) 07/03/2007    Past Surgical History:  Procedure Laterality Date  . COLONOSCOPY W/ BIOPSIES    . ESOPHAGOGASTRODUODENOSCOPY    . HERNIA REPAIR Bilateral   . OPEN REDUCTION INTERNAL FIXATION (ORIF) DISTAL RADIAL FRACTURE Right 02/11/2018   Procedure: OPEN REDUCTION INTERNAL FIXATION (ORIF) DISTAL RADIAL FRACTURE;  Surgeon: Milly Jakob, MD;  Location: [place];  Service: Orthopedics;  Laterality: Right;  . TONSILLECTOMY          Home Medications    Prior to Admission medications   Medication Sig Start Date End Date Taking? Authorizing Provider  acetaminophen (TYLENOL) 500 MG tablet Take 1,000 mg by mouth every 6 (six) hours as needed for moderate pain.   Yes [provider]  lisinopril (PRINIVIL,ZESTRIL) 10 MG tablet Take 1 tablet (10 mg total) by mouth daily. 08/21/17 05/15/18 Yes Lacroce, Hulen Shouts, MD  LORazepam (ATIVAN) 1 MG tablet Take 1 tablet by mouth 3 (three) times daily as needed for anxiety. 05/06/18  Yes [provider]  ondansetron (ZOFRAN ODT) 4 MG disintegrating tablet 51m ODT q4 hours prn nausea/vomit 04/17/18  Yes ZMilton Ferguson MD  pantoprazole (PROTONIX) 40 MG tablet Take 1 tablet (40 mg total) by mouth daily. 04/24/14  Yes JJanith Lima MD  sucralfate (CARAFATE) 1 G tablet Take 1 tablet (1 g total) by mouth 4 (four) times daily -  with meals and at bedtime. Chew before swallowing 12/17/13  Yes JJanith Lima MD  traZODone (DESYREL) 100 MG tablet Take 100 mg by mouth at bedtime. 05/06/18  Yes [provider]  verapamil  (CALAN-SR) 240 MG CR tablet Take 240 mg by mouth at bedtime. 02/09/17  Yes [provider]  clindamycin (CLEOCIN) 150 MG capsule Take 3 capsules (450 mg total) by mouth 3 (three) times daily. 05/15/18   RQuintella Reichert MD    Family History Family History  Problem Relation Age of Onset  . Anxiety disorder Mother   . Congestive Heart Failure Mother   . Crohn's disease Mother   . Colon cancer Mother 637 . Arthritis Father   . High blood pressure Father     Social History Social History   Tobacco Use  . Smoking status: Former Smoker    Types: Cigarettes    Last attempt to quit: 2014    Years since quitting: 5.9  . Smokeless tobacco: Never Used  Substance Use Topics  . Alcohol use: Yes    Comment: daily/ 3-4 beers, has not has any in a few days  . Drug use: No     Allergies   Diphenhydramine hcl; Flexeril [cyclobenzaprine]; Hctz [hydrochlorothiazide]; Nsaids; and Hydroxyzine   Review of Systems Review of Systems  All other systems reviewed and are negative.    Physical Exam Updated Vital Signs BP (!) 160/82   Pulse 69   Temp 98 F (36.7 C) (Oral)   Resp 18   Wt 77.1 kg   SpO2 99%   BMI 23.71 kg/m   Physical Exam  Constitutional: He is oriented to person, place, and time. He appears well-developed and well-nourished.  HENT:  Head: Normocephalic and atraumatic.  Poor dentition with all teeth fractured at the gum line. Mild generalized gingival induration without focal abscess or draining.  Neck: Neck supple.  Cardiovascular: Normal rate and regular rhythm.  No murmur heard. Pulmonary/Chest: Effort normal and breath sounds normal. No respiratory distress.  Abdominal: Soft. There is no tenderness. There is no rebound and no guarding.  Musculoskeletal: He exhibits no edema or tenderness.  Neurological: He is alert and oriented to person, place, and time.  No facial asymmetry.  5/5 strength in all four extremities with sensation to light touch intact in  all four extremities.    Skin: Skin is warm and dry.  Psychiatric: He has a normal mood and affect. His behavior is normal.  Nursing note and vitals reviewed.    ED Treatments / Results  Labs (all labs ordered are listed, but only abnormal results are displayed) Labs Reviewed  COMPREHENSIVE METABOLIC PANEL - Abnormal; Notable for the following components:      Result Value   Sodium 134 (*)    Glucose, Bld 125 (*)    BUN 5 (*)    Calcium 8.8 (*)    All other components within normal limits  CBC WITH DIFFERENTIAL/PLATELET - Abnormal; Notable for the following components:   WBC 13.6 (*)    Neutro Abs 11.0 (*)    Monocytes Absolute 1.2 (*)    All other components within  normal limits  URINALYSIS, ROUTINE W REFLEX MICROSCOPIC - Abnormal; Notable for the following components:   Color, Urine STRAW (*)    Specific Gravity, Urine 1.003 (*)    Hgb urine dipstick SMALL (*)    All other components within normal limits    EKG None  Radiology Dg Chest 2 View  Result Date: 05/15/2018 CLINICAL DATA:  Cough. EXAM: CHEST - 2 VIEW COMPARISON:  Radiographs of March 28, 2018. FINDINGS: The heart size and mediastinal contours are within normal limits. Both lungs are clear. The visualized skeletal structures are unremarkable. IMPRESSION: No active cardiopulmonary disease. Electronically Signed   By: Marijo Conception, M.D.   On: 05/15/2018 08:37    Procedures Procedures (including critical care time)  Medications Ordered in ED Medications  sodium chloride 0.9 % bolus 1,000 mL (0 mLs Intravenous Stopped 05/15/18 0947)  acetaminophen (TYLENOL) tablet 650 mg (650 mg Oral Given 05/15/18 0854)  clindamycin (CLEOCIN) capsule 450 mg (450 mg Oral Given 05/15/18 0947)     Initial Impression / Assessment and Plan / ED Course  I have reviewed the triage vital signs and the nursing notes.  Pertinent labs & imaging results that were available during my care of the patient were reviewed by me and  considered in my medical decision making (see chart for details).     Patient here for evaluation of dental pain, concern for dehydration. He is non-toxic appearing on examination. He has extensive dental decay as well as gingival induration and tenderness. There is no clear abscess or cellulitis at this time. Given his significant erythema will start on antibiotics. He is eating and drinking in the emergency department without difficulty. Discussed with patient importance of dentistry/oral surgery follow-up for extraction. Discussed outpatient follow-up and return precautions.  Labs demonstrate mild leukocytosis. He is non-toxic appearing on examination with no concerning features for sepsis.  Final Clinical Impressions(s) / ED Diagnoses   Final diagnoses:  Pain due to dental caries    ED Discharge Orders         Ordered    clindamycin (CLEOCIN) 150 MG capsule  3 times daily     05/15/18 0941           Quintella Reichert, MD 05/15/18 1357

## 2018-05-21 ENCOUNTER — Encounter (HOSPITAL_COMMUNITY): Payer: Self-pay | Admitting: Emergency Medicine

## 2018-05-21 ENCOUNTER — Emergency Department (HOSPITAL_COMMUNITY)
Admission: EM | Admit: 2018-05-21 | Discharge: 2018-05-21 | Disposition: A | Discharge status: Home / Self Care | Payer: Medicaid Other | Attending: Emergency Medicine | Admitting: Emergency Medicine

## 2018-05-21 DIAGNOSIS — F0390 Unspecified dementia without behavioral disturbance: Secondary | ICD-10-CM | POA: Diagnosis not present

## 2018-05-21 DIAGNOSIS — Z79899 Other long term (current) drug therapy: Secondary | ICD-10-CM | POA: Diagnosis not present

## 2018-05-21 DIAGNOSIS — I10 Essential (primary) hypertension: Secondary | ICD-10-CM | POA: Insufficient documentation

## 2018-05-21 DIAGNOSIS — F329 Major depressive disorder, single episode, unspecified: Secondary | ICD-10-CM | POA: Insufficient documentation

## 2018-05-21 DIAGNOSIS — K029 Dental caries, unspecified: Secondary | ICD-10-CM | POA: Diagnosis not present

## 2018-05-21 DIAGNOSIS — Z87891 Personal history of nicotine dependence: Secondary | ICD-10-CM | POA: Diagnosis not present

## 2018-05-21 DIAGNOSIS — F1010 Alcohol abuse, uncomplicated: Secondary | ICD-10-CM | POA: Insufficient documentation

## 2018-05-21 DIAGNOSIS — F419 Anxiety disorder, unspecified: Secondary | ICD-10-CM

## 2018-05-21 LAB — COMPREHENSIVE METABOLIC PANEL
ALBUMIN: 4.1 g/dL (ref 3.5–5.0)
ALT: 15 U/L (ref 0–44)
ANION GAP: 10 (ref 5–15)
AST: 21 U/L (ref 15–41)
Alkaline Phosphatase: 74 U/L (ref 38–126)
BILIRUBIN TOTAL: 0.6 mg/dL (ref 0.3–1.2)
BUN: 6 mg/dL — ABNORMAL LOW (ref 8–23)
CHLORIDE: 94 mmol/L — AB (ref 98–111)
CO2: 27 mmol/L (ref 22–32)
Calcium: 8.9 mg/dL (ref 8.9–10.3)
Creatinine, Ser: 0.68 mg/dL (ref 0.61–1.24)
GFR calc Af Amer: >60 mL/min (ref >60)
GFR calc non Af Amer: >60 mL/min (ref >60)
GLUCOSE: 122 mg/dL — AB (ref 70–99)
POTASSIUM: 4.3 mmol/L (ref 3.5–5.1)
SODIUM: 131 mmol/L — AB (ref 135–145)
Total Protein: 7.1 g/dL (ref 6.5–8.1)

## 2018-05-21 LAB — LIPASE, BLOOD: LIPASE: 29 U/L (ref 11–51)

## 2018-05-21 LAB — CBC
HEMATOCRIT: 42.6 % (ref 39.0–52.0)
HEMOGLOBIN: 14.1 g/dL (ref 13.0–17.0)
MCH: 30.5 pg (ref 26.0–34.0)
MCHC: 33.1 g/dL (ref 30.0–36.0)
MCV: 92.2 fL (ref 80.0–100.0)
Platelets: 347 10*3/uL (ref 150–400)
RBC: 4.62 MIL/uL (ref 4.22–5.81)
RDW: 12.8 % (ref 11.5–15.5)
WBC: 13 10*3/uL — ABNORMAL HIGH (ref 4.0–10.5)
nRBC: 0 % (ref 0.0–0.2)

## 2018-05-21 LAB — URINALYSIS, ROUTINE W REFLEX MICROSCOPIC
BACTERIA UA: NONE SEEN
BILIRUBIN URINE: NEGATIVE
Glucose, UA: NEGATIVE mg/dL
KETONES UR: NEGATIVE mg/dL
Leukocytes, UA: NEGATIVE
Nitrite: NEGATIVE
Protein, ur: NEGATIVE mg/dL
SPECIFIC GRAVITY, URINE: 1.005 (ref 1.005–1.030)
pH: 8 (ref 5.0–8.0)

## 2018-05-21 MED ORDER — AMOXICILLIN 500 MG PO CAPS
500.0000 mg | ORAL_CAPSULE | Freq: Once | ORAL | Status: AC
  Administered 2018-05-21: 500 mg via ORAL
  Filled 2018-05-21: qty 1

## 2018-05-21 MED ORDER — PANTOPRAZOLE SODIUM 40 MG IV SOLR
40.0000 mg | Freq: Once | INTRAVENOUS | Status: AC
  Administered 2018-05-21: 40 mg via INTRAVENOUS
  Filled 2018-05-21: qty 40

## 2018-05-21 MED ORDER — LORAZEPAM 2 MG/ML IJ SOLN
1.0000 mg | Freq: Once | INTRAMUSCULAR | Status: AC
  Administered 2018-05-21: 1 mg via INTRAVENOUS
  Filled 2018-05-21: qty 1

## 2018-05-21 MED ORDER — SODIUM CHLORIDE 0.9 % IV BOLUS
1000.0000 mL | Freq: Once | INTRAVENOUS | Status: AC
  Administered 2018-05-21: 1000 mL via INTRAVENOUS

## 2018-05-21 MED ORDER — AMOXICILLIN 500 MG PO CAPS: 500.0000 mg | ORAL_CAPSULE | Freq: Three times a day (TID) | ORAL | 0 refills | Status: DC

## 2018-05-21 MED ORDER — ONDANSETRON HCL 4 MG/2ML IJ SOLN
4.0000 mg | Freq: Once | INTRAMUSCULAR | Status: AC
  Administered 2018-05-21: 4 mg via INTRAVENOUS
  Filled 2018-05-21: qty 2

## 2018-05-21 MED ORDER — MORPHINE SULFATE (PF) 4 MG/ML IV SOLN
4.0000 mg | Freq: Once | INTRAVENOUS | Status: AC
  Administered 2018-05-21: 4 mg via INTRAVENOUS
  Filled 2018-05-21: qty 1

## 2018-05-21 NOTE — ED Triage Notes (Addendum)
Pt brought in by [facility] for not eating for several days since started antibiotic for dental infection and causing leg cramps. Reports drinking ETOH last night. Pt adds abd pains.

## 2018-05-21 NOTE — ED Provider Notes (Signed)
[facility] DEPT Provider Note   CSN: [id number] Arrival date & time: 05/21/18  1448     History   Chief Complaint Chief Complaint  Patient presents with  . not eating  . Abdominal Pain    HPI Anthony Lamb is a 62 y.o. male.  Pt presents to the ED today with anxiety, abdominal pain, sinus pain.  The pt is well known to this ED.  He was seen in the ED on 12/11 and was placed on clindamycin for his dental caries.  The pt said those abx worsened his colitis.  He has had diarrhea.  He feels like he is dehydrated.  He denies n/v, but feels like he has not had any appetite since starting the clindmycin.  He only took the meds from 12/11-14.     Past Medical History:  Diagnosis Date  . Alcohol abuse, in remission   . Anemia   . Ankylosing spondylitis ([place])   . Anxiety   . Chronic diarrhea   . Chronic pain   . Colitis   . Depression   . Esophagitis   . Gastritis   . GERD (gastroesophageal reflux disease)   . Hypertension   . IBS (irritable bowel syndrome)   . Poor dentition   . SIADH (syndrome of inappropriate ADH production) [facility])     Patient Active Problem List   Diagnosis Date Noted  . Alcohol withdrawal ([place]) 02/08/2018  . IBS (irritable bowel syndrome)   . Hypertension   . Colitis   . Adjustment disorder with mixed anxiety and depressed mood 05/11/2017  . Alcohol abuse   . Anxiety   . Gastroesophageal reflux disease   . Osteopenia determined by x-ray 08/06/2014  . Stress fracture of calcaneus 08/06/2014  . Vitamin D deficiency 12/18/2013  . Dementia ([place]) 12/18/2013  . Benign microscopic hematuria 07/06/2013  . SIADH (syndrome of inappropriate ADH production) ([place]) 06/22/2013  . Ankylosing spondylitis ([place]) 06/20/2013  . Malnutrition of moderate degree ([place]) 06/20/2013  . BPH (benign prostatic hyperplasia) 08/22/2010  . History of colonic polyps 08/13/2008  . Essential hypertension 07/03/2007  . PUD (peptic ulcer  disease) 07/03/2007    Past Surgical History:  Procedure Laterality Date  . COLONOSCOPY W/ BIOPSIES    . ESOPHAGOGASTRODUODENOSCOPY    . HERNIA REPAIR Bilateral   . OPEN REDUCTION INTERNAL FIXATION (ORIF) DISTAL RADIAL FRACTURE Right 02/11/2018   Procedure: OPEN REDUCTION INTERNAL FIXATION (ORIF) DISTAL RADIAL FRACTURE;  Surgeon: Milly Jakob, MD;  Location: [facility];  Service: Orthopedics;  Laterality: Right;  . TONSILLECTOMY          Home Medications    Prior to Admission medications   Medication Sig Start Date End Date Taking? Authorizing Provider  acetaminophen (TYLENOL) 500 MG tablet Take 1,000 mg by mouth every 6 (six) hours as needed for moderate pain.   Yes [provider]  lisinopril (PRINIVIL,ZESTRIL) 10 MG tablet Take 1 tablet (10 mg total) by mouth daily. 08/21/17 05/21/18 Yes Lacroce, Hulen Shouts, MD  LORazepam (ATIVAN) 1 MG tablet Take 1 tablet by mouth 3 (three) times daily as needed for anxiety. 05/06/18  Yes [provider]  pantoprazole (PROTONIX) 40 MG tablet Take 1 tablet (40 mg total) by mouth daily. 04/24/14  Yes Janith Lima, MD  sucralfate (CARAFATE) 1 G tablet Take 1 tablet (1 g total) by mouth 4 (four) times daily -  with meals and at bedtime. Chew before swallowing 12/17/13  Yes Janith Lima, MD  verapamil (  CALAN-SR) 240 MG CR tablet Take 240 mg by mouth at bedtime. 02/09/17  Yes [provider]  zolpidem (AMBIEN) 10 MG tablet Take 10 mg by mouth at bedtime.   Yes [provider]  amoxicillin (AMOXIL) 500 MG capsule Take 1 capsule (500 mg total) by mouth 3 (three) times daily. 05/21/18   [name], MD    Family History Family History  Problem Relation Age of Onset  . Anxiety disorder Mother   . Congestive Heart Failure Mother   . Crohn's disease Mother   . Colon cancer Mother 78  . Arthritis Father   . High blood pressure Father     Social History Social History   Tobacco Use  . Smoking status: Former  Smoker    Types: Cigarettes    Last attempt to quit: 2014    Years since quitting: 5.9  . Smokeless tobacco: Never Used  Substance Use Topics  . Alcohol use: Yes    Comment: daily/ 3-4 beers, has not has any in a few days  . Drug use: No     Allergies   Diphenhydramine hcl; Flexeril [cyclobenzaprine]; Hctz [hydrochlorothiazide]; Nsaids; and Hydroxyzine   Review of Systems Review of Systems  HENT: Positive for dental problem.   Gastrointestinal: Positive for abdominal pain and diarrhea.  Psychiatric/Behavioral: The patient is nervous/anxious.   All other systems reviewed and are negative.    Physical Exam Updated Vital Signs BP (!) 158/88   Pulse 89   Temp 98.7 F (37.1 C) (Oral)   Resp 16   SpO2 93%   Physical Exam Vitals signs and nursing note reviewed.  Constitutional:      Appearance: He is well-developed and normal weight.  HENT:     Head: Normocephalic and atraumatic.     Mouth/Throat:     Mouth: Mucous membranes are moist.     Comments: Poor dental hygeine Eyes:     Extraocular Movements: Extraocular movements intact.  Cardiovascular:     Rate and Rhythm: Normal rate and regular rhythm.  Pulmonary:     Effort: Pulmonary effort is normal.     Breath sounds: Normal breath sounds.  Abdominal:     General: Abdomen is flat. Bowel sounds are normal.     Palpations: Abdomen is soft.     Tenderness: There is generalized abdominal tenderness.  Skin:    General: Skin is warm and dry.     Capillary Refill: Capillary refill takes less than 2 seconds.  Neurological:     General: No focal deficit present.     Mental Status: He is alert and oriented to person, place, and time.  Psychiatric:        Mood and Affect: Mood is anxious.        Behavior: Behavior normal.      ED Treatments / Results  Labs (all labs ordered are listed, but only abnormal results are displayed) Labs Reviewed  COMPREHENSIVE METABOLIC PANEL - Abnormal; Notable for the following  components:      Result Value   Sodium 131 (*)    Chloride 94 (*)    Glucose, Bld 122 (*)    BUN 6 (*)    All other components within normal limits  CBC - Abnormal; Notable for the following components:   WBC 13.0 (*)    All other components within normal limits  URINALYSIS, ROUTINE W REFLEX MICROSCOPIC - Abnormal; Notable for the following components:   Hgb urine dipstick SMALL (*)    All  other components within normal limits  LIPASE, BLOOD    EKG None  Radiology No results found.  Procedures Procedures (including critical care time)  Medications Ordered in ED Medications  amoxicillin (AMOXIL) capsule 500 mg (has no administration in time range)  sodium chloride 0.9 % bolus 1,000 mL (1,000 mLs Intravenous New Bag/Given 05/21/18 1244)  LORazepam (ATIVAN) injection 1 mg (1 mg Intravenous Given 05/21/18 1247)  morphine 4 MG/ML injection 4 mg (4 mg Intravenous Given 05/21/18 1244)  ondansetron (ZOFRAN) injection 4 mg (4 mg Intravenous Given 05/21/18 1243)  pantoprazole (PROTONIX) injection 40 mg (40 mg Intravenous Given 05/21/18 1243)     Initial Impression / Assessment and Plan / ED Course  I have reviewed the triage vital signs and the nursing notes.  Pertinent labs & imaging results that were available during my care of the patient were reviewed by me and considered in my medical decision making (see chart for details).     Pt is feeling much better.  He is eating and drinking.  I am going to change his abx from clinda to amox.  He said his stomach does ok with Amox.  He is instructed to f/u with the dentist and with his pcp.  Return if worse.  Final Clinical Impressions(s) / ED Diagnoses   Final diagnoses:  Dental caries  Anxiety    ED Discharge Orders         Ordered    amoxicillin (AMOXIL) 500 MG capsule  3 times daily     05/21/18 1351           [name], MD 05/21/18 1352

## 2018-05-25 ENCOUNTER — Emergency Department (HOSPITAL_COMMUNITY)
Admission: EM | Admit: 2018-05-25 | Discharge: 2018-05-25 | Disposition: A | Discharge status: Home / Self Care | Payer: Medicaid Other | Attending: Emergency Medicine | Admitting: Emergency Medicine

## 2018-05-25 ENCOUNTER — Other Ambulatory Visit: Payer: Self-pay

## 2018-05-25 DIAGNOSIS — F10230 Alcohol dependence with withdrawal, uncomplicated: Secondary | ICD-10-CM | POA: Diagnosis not present

## 2018-05-25 DIAGNOSIS — K209 Esophagitis, unspecified without bleeding: Secondary | ICD-10-CM

## 2018-05-25 DIAGNOSIS — E871 Hypo-osmolality and hyponatremia: Secondary | ICD-10-CM | POA: Insufficient documentation

## 2018-05-25 DIAGNOSIS — I10 Essential (primary) hypertension: Secondary | ICD-10-CM | POA: Diagnosis not present

## 2018-05-25 DIAGNOSIS — F10930 Alcohol use, unspecified with withdrawal, uncomplicated: Secondary | ICD-10-CM

## 2018-05-25 DIAGNOSIS — Z79899 Other long term (current) drug therapy: Secondary | ICD-10-CM | POA: Insufficient documentation

## 2018-05-25 DIAGNOSIS — Z87891 Personal history of nicotine dependence: Secondary | ICD-10-CM | POA: Diagnosis not present

## 2018-05-25 DIAGNOSIS — R1013 Epigastric pain: Secondary | ICD-10-CM | POA: Diagnosis present

## 2018-05-25 LAB — CBC WITH DIFFERENTIAL/PLATELET
Abs Immature Granulocytes: 0.02 10*3/uL (ref 0.00–0.07)
Basophils Absolute: 0 10*3/uL (ref 0.0–0.1)
Basophils Relative: 1 %
Eosinophils Absolute: 0 10*3/uL (ref 0.0–0.5)
Eosinophils Relative: 0 %
HCT: 43.5 % (ref 39.0–52.0)
HEMOGLOBIN: 14.6 g/dL (ref 13.0–17.0)
Immature Granulocytes: 0 %
Lymphocytes Relative: 12 %
Lymphs Abs: 0.9 10*3/uL (ref 0.7–4.0)
MCH: 30.3 pg (ref 26.0–34.0)
MCHC: 33.6 g/dL (ref 30.0–36.0)
MCV: 90.2 fL (ref 80.0–100.0)
MONOS PCT: 13 %
Monocytes Absolute: 1 10*3/uL (ref 0.1–1.0)
Neutro Abs: 5.5 10*3/uL (ref 1.7–7.7)
Neutrophils Relative %: 74 %
Platelets: 343 10*3/uL (ref 150–400)
RBC: 4.82 MIL/uL (ref 4.22–5.81)
RDW: 12.6 % (ref 11.5–15.5)
WBC: 7.5 10*3/uL (ref 4.0–10.5)
nRBC: 0 % (ref 0.0–0.2)

## 2018-05-25 LAB — COMPREHENSIVE METABOLIC PANEL
ALT: 18 U/L (ref 0–44)
AST: 22 U/L (ref 15–41)
Albumin: 4.1 g/dL (ref 3.5–5.0)
Alkaline Phosphatase: 75 U/L (ref 38–126)
Anion gap: 10 (ref 5–15)
BUN: 5 mg/dL — ABNORMAL LOW (ref 8–23)
CO2: 26 mmol/L (ref 22–32)
Calcium: 8.8 mg/dL — ABNORMAL LOW (ref 8.9–10.3)
Chloride: 92 mmol/L — ABNORMAL LOW (ref 98–111)
Creatinine, Ser: 0.61 mg/dL (ref 0.61–1.24)
GFR calc Af Amer: >60 mL/min (ref >60)
GFR calc non Af Amer: >60 mL/min (ref >60)
GLUCOSE: 107 mg/dL — AB (ref 70–99)
Potassium: 3.8 mmol/L (ref 3.5–5.1)
Sodium: 128 mmol/L — ABNORMAL LOW (ref 135–145)
Total Bilirubin: 0.6 mg/dL (ref 0.3–1.2)
Total Protein: 7.2 g/dL (ref 6.5–8.1)

## 2018-05-25 LAB — MAGNESIUM: Magnesium: 2 mg/dL (ref 1.7–2.4)

## 2018-05-25 LAB — LIPASE, BLOOD: Lipase: 37 U/L (ref 11–51)

## 2018-05-25 MED ORDER — CHLORDIAZEPOXIDE HCL 25 MG PO CAPS: ORAL_CAPSULE | ORAL | 0 refills | Status: DC

## 2018-05-25 MED ORDER — SUCRALFATE 1 G PO TABS
1.0000 g | ORAL_TABLET | Freq: Once | ORAL | Status: AC
  Administered 2018-05-25: 1 g via ORAL
  Filled 2018-05-25: qty 1

## 2018-05-25 MED ORDER — LORAZEPAM 2 MG/ML IJ SOLN
1.0000 mg | Freq: Once | INTRAMUSCULAR | Status: AC
  Administered 2018-05-25: 1 mg via INTRAVENOUS
  Filled 2018-05-25: qty 1

## 2018-05-25 MED ORDER — ONDANSETRON HCL 4 MG/2ML IJ SOLN
4.0000 mg | Freq: Once | INTRAMUSCULAR | Status: AC
  Administered 2018-05-25: 4 mg via INTRAVENOUS
  Filled 2018-05-25: qty 2

## 2018-05-25 MED ORDER — SODIUM CHLORIDE 0.9 % IV BOLUS
1000.0000 mL | Freq: Once | INTRAVENOUS | Status: AC
  Administered 2018-05-25: 1000 mL via INTRAVENOUS

## 2018-05-25 MED ORDER — ONDANSETRON 8 MG PO TBDP: 8.0000 mg | ORAL_TABLET | Freq: Three times a day (TID) | ORAL | 0 refills | Status: DC | PRN

## 2018-05-25 NOTE — ED Provider Notes (Signed)
[facility] DEPT Provider Note   CSN: [id number] Arrival date & time: 05/25/18  3832     History   Chief Complaint Chief Complaint  Patient presents with  . Abdominal Pain    HPI Anthony Lamb is a 62 y.o. male.  HPI 62 year old male with history of alcohol abuse, IBS comes in with chief complaint of abdominal pain and difficulty swallowing.  Patient reports that he has been having worsening and progressive occultly swallowing for the last several months.  He has now made up his mind that he is going to stop drinking.  He is concerned that he might have esophageal cancer.  Patient's last alcoholic drink was about 3 to 5 days ago.  He has attempted to drink thereafter, but feeling sick therefore he has not actually had more than couple of sips of alcohol.  He is also complaining of some shaking and restlessness.  Along with the difficulty in swallowing, patient is also having epigastric abdominal pain.  The pain is fairly constant and has again been present for several months.  The epigastric pain radiates up towards his neck.  He is already taking PPI.    Past Medical History:  Diagnosis Date  . Alcohol abuse, in remission   . Anemia   . Ankylosing spondylitis ([place])   . Anxiety   . Chronic diarrhea   . Chronic pain   . Colitis   . Depression   . Esophagitis   . Gastritis   . GERD (gastroesophageal reflux disease)   . Hypertension   . IBS (irritable bowel syndrome)   . Poor dentition   . SIADH (syndrome of inappropriate ADH production) [facility])     Patient Active Problem List   Diagnosis Date Noted  . Alcohol withdrawal ([place]) 02/08/2018  . IBS (irritable bowel syndrome)   . Hypertension   . Colitis   . Adjustment disorder with mixed anxiety and depressed mood 05/11/2017  . Alcohol abuse   . Anxiety   . Gastroesophageal reflux disease   . Osteopenia determined by x-ray 08/06/2014  . Stress fracture of calcaneus 08/06/2014  . Vitamin  D deficiency 12/18/2013  . Dementia ([place]) 12/18/2013  . Benign microscopic hematuria 07/06/2013  . SIADH (syndrome of inappropriate ADH production) ([place]) 06/22/2013  . Ankylosing spondylitis ([place]) 06/20/2013  . Malnutrition of moderate degree ([place]) 06/20/2013  . BPH (benign prostatic hyperplasia) 08/22/2010  . History of colonic polyps 08/13/2008  . Essential hypertension 07/03/2007  . PUD (peptic ulcer disease) 07/03/2007    Past Surgical History:  Procedure Laterality Date  . COLONOSCOPY W/ BIOPSIES    . ESOPHAGOGASTRODUODENOSCOPY    . HERNIA REPAIR Bilateral   . OPEN REDUCTION INTERNAL FIXATION (ORIF) DISTAL RADIAL FRACTURE Right 02/11/2018   Procedure: OPEN REDUCTION INTERNAL FIXATION (ORIF) DISTAL RADIAL FRACTURE;  Surgeon: Milly Jakob, MD;  Location: [facility];  Service: Orthopedics;  Laterality: Right;  . TONSILLECTOMY          Home Medications    Prior to Admission medications   Medication Sig Start Date End Date Taking? Authorizing Provider  acetaminophen (TYLENOL) 500 MG tablet Take 1,000 mg by mouth every 6 (six) hours as needed for moderate pain.   Yes [provider]  amoxicillin (AMOXIL) 500 MG capsule Take 1 capsule (500 mg total) by mouth 3 (three) times daily. 05/21/18  Yes [name], MD  lisinopril (PRINIVIL,ZESTRIL) 10 MG tablet Take 1 tablet (10 mg total) by mouth daily. 08/21/17 05/25/18 Yes Lacroce, Hulen Shouts,  MD  LORazepam (ATIVAN) 1 MG tablet Take 1 tablet by mouth 3 (three) times daily as needed for anxiety. 05/06/18  Yes [provider]  pantoprazole (PROTONIX) 40 MG tablet Take 1 tablet (40 mg total) by mouth daily. 04/24/14  Yes Janith [place], MD  sucralfate (CARAFATE) 1 G tablet Take 1 tablet (1 g total) by mouth 4 (four) times daily -  with meals and at bedtime. Chew before swallowing 12/17/13  Yes Janith [place], MD  verapamil (CALAN-SR) 240 MG CR tablet Take 240 mg by mouth at bedtime. 02/09/17  Yes [provider]    zolpidem (AMBIEN) 10 MG tablet Take 10 mg by mouth at bedtime.   Yes [provider]  chlordiazePOXIDE (LIBRIUM) 25 MG capsule 69m PO TID x 1D, then 25-583mPO BID X 1D, then 25-5043mO QD X 1D 05/25/18   Roberto Romanoski, MD  ondansetron (ZOFRAN ODT) 8 MG disintegrating tablet Take 1 tablet (8 mg total) by mouth every 8 (eight) hours as needed for nausea. 05/25/18   NanVarney BilesD    Family History Family History  Problem Relation Age of Onset  . Anxiety disorder Mother   . Congestive Heart Failure Mother   . Crohn's disease Mother   . Colon cancer Mother 60 33 Arthritis Father   . High blood pressure Father     Social History Social History   Tobacco Use  . Smoking status: Former Smoker    Types: Cigarettes    Last attempt to quit: 2014    Years since quitting: 5.9  . Smokeless tobacco: Never Used  Substance Use Topics  . Alcohol use: Yes    Comment: daily/ 3-4 beers, has not has any in a few days  . Drug use: No     Allergies   Diphenhydramine hcl; Flexeril [cyclobenzaprine]; Hctz [hydrochlorothiazide]; Nsaids; and Hydroxyzine   Review of Systems Review of Systems  Constitutional: Positive for activity change.  Respiratory: Negative for shortness of breath.   Cardiovascular: Negative for chest pain.  Gastrointestinal: Positive for abdominal pain and vomiting.  Allergic/Immunologic: Negative for immunocompromised state.  Hematological: Does not bruise/bleed easily.  All other systems reviewed and are negative.    Physical Exam Updated Vital Signs BP (!) 184/88 (BP Location: Left Arm)   Pulse 92   Temp 98.5 F (36.9 C) (Oral)   Ht 5' 11"  (1.803 m)   Wt 79.4 kg   SpO2 99%   BMI 24.41 kg/m   Physical Exam Vitals signs and nursing note reviewed.  Constitutional:      Appearance: He is well-developed.  HENT:     Head: Atraumatic.  Neck:     Musculoskeletal: Neck supple.  Cardiovascular:     Rate and Rhythm: Normal rate.  Pulmonary:      Effort: Pulmonary effort is normal.  Abdominal:     Tenderness: There is generalized abdominal tenderness and tenderness in the right upper quadrant, epigastric area and left upper quadrant.     Hernia: No hernia is present.  Skin:    General: Skin is warm.  Neurological:     Mental Status: He is alert and oriented to person, place, and time.      ED Treatments / Results  Labs (all labs ordered are listed, but only abnormal results are displayed) Labs Reviewed  COMPREHENSIVE METABOLIC PANEL - Abnormal; Notable for the following components:      Result Value   Sodium 128 (*)    Chloride 92 (*)  Glucose, Bld 107 (*)    BUN 5 (*)    Calcium 8.8 (*)    All other components within normal limits  LIPASE, BLOOD  CBC WITH DIFFERENTIAL/PLATELET  MAGNESIUM    EKG None  Radiology No results found.  Procedures Procedures (including critical care time)  Medications Ordered in ED Medications  ondansetron (ZOFRAN) injection 4 mg (4 mg Intravenous Given 05/25/18 0841)  LORazepam (ATIVAN) injection 1 mg (1 mg Intravenous Given 05/25/18 0841)  sucralfate (CARAFATE) tablet 1 g (1 g Oral Given 05/25/18 0841)  sodium chloride 0.9 % bolus 1,000 mL (0 mLs Intravenous Stopped 05/25/18 1110)     Initial Impression / Assessment and Plan / ED Course  I have reviewed the triage vital signs and the nursing notes.  Pertinent labs & imaging results that were available during my care of the patient were reviewed by me and considered in my medical decision making (see chart for details).  Clinical Course as of May 25 1110  Sat May 25, 2018  1111 Pt reassessed. Pt's VSS and WNL. Pt's cap refill < 3 seconds. Pt has been hydrated in the ER and now passed po challenge. We will discharge with antiemetic. Strict ER return precautions have been discussed and pt will return if he is unable to tolerate fluids and symptoms are getting worse.    [AN]    Clinical Course User Index [AN] Varney Biles, MD    62 year old male with history of alcoholism comes in with chief complaint of difficulty in swallowing and epigastric abdominal pain radiating towards his chest. It appears that his symptoms have been present for several months now, they have just progressed over the past few days, prompting patient to seriously rethink his alcohol use.  He has not had alcohol in 5 days, stating that he last tried to drink 2 days ago but did not drink heavily even at that time.  His abdominal exam is nonsurgical.  Patient appears to have moist mucous membranes and hemodynamically stable and within normal limits.  He is having some difficulty in swallowing, however I do not appreciate any mass grossly that is concerning.  We gave him oral challenge with p.o. meds, and he was able to keep them down.  At home he has had emesis which have been nonbloody.  I think that some of the symptoms of anxiety, restlessness, nausea are related to his alcohol withdrawal.  He does not have any autonomic instability and denies any hallucinations or seizures, which is reassuring.  We will give him Librium which should at least help him with some of the symptoms.  He also needs to see GI doctors for likely an endoscopy.  I would not be surprised if he has severe esophagitis and possibly even tumors.  I also asked him to follow-up with his primary care doctor who can perhaps order some of these diagnostic tests as an outpatient.  Final Clinical Impressions(s) / ED Diagnoses   Final diagnoses:  Hyponatremia  Alcohol withdrawal syndrome without complication (HCC)  Esophagitis    ED Discharge Orders         Ordered    chlordiazePOXIDE (LIBRIUM) 25 MG capsule     05/25/18 1043    ondansetron (ZOFRAN ODT) 8 MG disintegrating tablet  Every 8 hours PRN     05/25/18 1044           Varney Biles, MD 05/25/18 1230

## 2018-05-25 NOTE — ED Triage Notes (Signed)
Patient was seen in ED three days ago

## 2018-05-25 NOTE — Discharge Instructions (Addendum)
We saw in the ER for difficulty in swallowing, alcohol withdrawal. Results here shows mild low sodium. We suspect that you are having esophagitis leading to difficulty in swallowing.  As discussed, there is a tumor possibility therefore you need to see your GI doctor. We have provided you with alcohol withdrawal medications. Also we are providing you with a list of outpatient rehab services.  Substance Abuse Treatment Programs  Intensive Outpatient Programs [facility]     [address] [place], [place]       [facility] [place] [address] [place], [place]  [facility]     (Inpatient and outpatient)     [address]           [place] [phone] (Suboxone and Methadone)  [place], [place] [address]      [place] [address] [place], [place]  [facility] (Outpatient/Inpatient, [place])    (insurance only) [phone]             [facility] ([place]) [place], [place]     [facility]     [address]     [place], [place]       [facility] (for caregivers and family) [phone] [address] Kristeen Mans. [place], [place]      Residential Treatment Programs [facility]      [address] [place], [place] [address]  [phone]       [facility] [address], [place], [place] [address] [phone]  [facility]        [phone]       [facility] [phone]  [facility] ([place]             [place], [place] or [place] of [place] [address] [place],  [address] [phone]  [facility]    [address]      [place], [place]       [facility] [address] [place], [place]  [place]   [address] [place], [place] [address]     [phone]      Admissions: 8am-3pm M-F  [facility] ([facility]) [address] [place], [place]  [facility]: Residential Program [phone] Days)   [place], [place] or  [phone]     [facility]: [place], [place] (Walk in Hours over the weekend or by referral)  [facility] [place], [place], [place] [address] [phone]  Crisis Mobile: [facility]:  [phone] (for crisis response 24 hours a day) [facility] Hotline:      [phone] Outpatient Psychiatry and Counseling  [facility]: Mobile Crisis Management 24 hours:  [phone]  [facility] the [employer] sliding scale fee and walk in schedule: M-F 8am-12pm/1pm-3pm [place], [place] [address] [address] [place], [place] [address] [phone]  [facility] [employer])- new patient walk-in appointments available Monday - Friday 8am -3pm.          [address] [place], [place] [address] [phone] or crisis line- [place] [facility]/ Intensive Outpatient Therapy Program [place], [place] [address] [place]      [phone] [address] [place], [place] [address]                 [place]   [facility] [phone]. [place], [place] [address]   [employer] [facility]          [address] Johnette Abraham   [place], [place] [address]       [phone]  [facility] [address], [place], [place] [address] [phone]  [facility]    [address] [place], [place] [address]     [place], [place] Joycelyn Man     [place] [address]     [phone]       [facility] [place] [address]  [facility]     [address], [place], [place], [place] [address] [place]     [address]     [place], [place] [address]     [phone]       [facility] [address] [place], [place] [address] [phone] Resources for Temporary Residential Assistance/Crisis [place] [facility]) M-F 8am-3pm   [address] [place], [place] [address]   [phone] Services include: laundry, barbering, support groups, case management, phone  & computer access, showers, AA/NA mtgs, mental health/substance abuse nurse, job skills class, disability information, VA assistance, spiritual classes, etc.   [facility]   [address], [place]     [place]              [employer] (women and children)       [address]. [place], [place] [address] [phone] [email or web address] for application and process Application Required  [facility]   [address]    [place] [address]     [phone]  [place], [place] [address] [phone]([email or web address] application appt.) Application Required  [facility] (women only)    [address]     [place], [place]  [address]     [phone]      Intake starts 6pm daily Need valid ID, SSC, & Police report [facility] [address] [place], [place] [phone] Application Required  [employer] (men only)     [place].      [place], [place]       [place] (Pregnant women only) [address] [place], [place]  [facility]      [place] [address]i [address].      [place], [place] [address]     [phone]             [facility] [address] [place], [place] 90 day commitment/SA/Application process  [facility](men only)     [address]     [place], [place]       Check-in at [facility] [address] [place], [place] [address] [phone] Men/Women/Women and Children must be there by 7 pm  [place], [place]

## 2018-05-25 NOTE — ED Triage Notes (Signed)
Patient reports epigastric pain and head pain 8/10.  Reports alcohol w/d, last drink two nights ago. Reports one episode of vomiting last night. Constant belching/burping.

## 2018-05-25 NOTE — ED Notes (Signed)
Bed: AS50 Expected date:  Expected time:  Means of arrival:  Comments: Epigastric

## 2018-05-27 ENCOUNTER — Emergency Department (HOSPITAL_COMMUNITY)
Admission: EM | Admit: 2018-05-27 | Discharge: 2018-05-27 | Disposition: A | Discharge status: Home / Self Care | Payer: Medicaid Other | Attending: Emergency Medicine | Admitting: Emergency Medicine

## 2018-05-27 ENCOUNTER — Other Ambulatory Visit: Payer: Self-pay

## 2018-05-27 DIAGNOSIS — R6889 Other general symptoms and signs: Secondary | ICD-10-CM

## 2018-05-27 DIAGNOSIS — F0390 Unspecified dementia without behavioral disturbance: Secondary | ICD-10-CM | POA: Insufficient documentation

## 2018-05-27 DIAGNOSIS — I10 Essential (primary) hypertension: Secondary | ICD-10-CM | POA: Diagnosis not present

## 2018-05-27 DIAGNOSIS — Z87891 Personal history of nicotine dependence: Secondary | ICD-10-CM | POA: Insufficient documentation

## 2018-05-27 DIAGNOSIS — R1084 Generalized abdominal pain: Secondary | ICD-10-CM | POA: Diagnosis present

## 2018-05-27 DIAGNOSIS — Z79899 Other long term (current) drug therapy: Secondary | ICD-10-CM | POA: Insufficient documentation

## 2018-05-27 DIAGNOSIS — G894 Chronic pain syndrome: Secondary | ICD-10-CM | POA: Diagnosis not present

## 2018-05-27 DIAGNOSIS — F419 Anxiety disorder, unspecified: Secondary | ICD-10-CM | POA: Diagnosis not present

## 2018-05-27 NOTE — ED Notes (Signed)
Pt requesting food. Pt provided with food. Pt requesting to speak with SW. SW called and they are not on the campus. Pt made aware

## 2018-05-27 NOTE — ED Notes (Signed)
Pt attempting to call for a ride at this time.

## 2018-05-27 NOTE — ED Triage Notes (Signed)
Pt was here 05/25/18  Pt states " I think I have cancer"  Pt has groin and rectum pain, as well as in his arms. Generalized abdominal pain. General not feeling well Loss of appetite per patient over last couple months.  Out of anxiety and sleep medication.

## 2018-05-27 NOTE — ED Notes (Signed)
Pt getting dressed at this time. Pt continually stating " I dont know how I am going to get home. No one can come get me." Pt offered bus pass. Pt states " I cant ride the bus" Pt informed there is a phone in the lobby and he can continue to call for a ride.

## 2018-05-27 NOTE — Discharge Instructions (Signed)
1.  You have been given a list of resources for seeking treatment for anxiety and if you have additionally any issues with ongoing alcohol abuse.  You also should speak to your family doctor about being seen and treated. 2.  You already have prescriptions existing for Protonix, Carafate and Zofran.  Take these as prescribed for your symptoms.

## 2018-05-27 NOTE — ED Notes (Signed)
ED Provider at bedside. 

## 2018-05-27 NOTE — ED Notes (Addendum)
Pt updated on plan of care. Pt requesting to see provider. Provider aware. Pt requesting water. Pt provided with water.

## 2018-05-27 NOTE — ED Notes (Signed)
Pt ambulated to the restroom without difficulty

## 2018-05-27 NOTE — ED Provider Notes (Signed)
[facility] DEPT Provider Note   CSN: [id number] Arrival date & time: 05/27/18  2683     History   Chief Complaint Chief Complaint  Patient presents with  . Pain    HPI Anthony Lamb is a 62 y.o. male.  HPI Patient has multiple complaints.  He reports he has pain in his upper abdomen and his lower abdomen.  He reports he cannot eat or drink.  He states he is dehydrated.  He reports he thinks that he might have cancer.  Patient has prior history of alcohol use but has stopped drinking more recently. Patient reports that he is having anxiety.  He states he has not slept in 5 days.  He reports that the Librium prescribed 2 days ago from the emergency department is not helping his symptoms at all. Patient reports that he lives alone.  He describes having social support through [facility] team.  He reports he has called but no one is calling him back. Past Medical History:  Diagnosis Date  . Alcohol abuse, in remission   . Anemia   . Ankylosing spondylitis ([place])   . Anxiety   . Chronic diarrhea   . Chronic pain   . Colitis   . Depression   . Esophagitis   . Gastritis   . GERD (gastroesophageal reflux disease)   . Hypertension   . IBS (irritable bowel syndrome)   . Poor dentition   . SIADH (syndrome of inappropriate ADH production) [facility])     Patient Active Problem List   Diagnosis Date Noted  . Alcohol withdrawal ([place]) 02/08/2018  . IBS (irritable bowel syndrome)   . Hypertension   . Colitis   . Adjustment disorder with mixed anxiety and depressed mood 05/11/2017  . Alcohol abuse   . Anxiety   . Gastroesophageal reflux disease   . Osteopenia determined by x-ray 08/06/2014  . Stress fracture of calcaneus 08/06/2014  . Vitamin D deficiency 12/18/2013  . Dementia ([place]) 12/18/2013  . Benign microscopic hematuria 07/06/2013  . SIADH (syndrome of inappropriate ADH production) ([place]) 06/22/2013  . Ankylosing spondylitis ([place])  06/20/2013  . Malnutrition of moderate degree ([place]) 06/20/2013  . BPH (benign prostatic hyperplasia) 08/22/2010  . History of colonic polyps 08/13/2008  . Essential hypertension 07/03/2007  . PUD (peptic ulcer disease) 07/03/2007    Past Surgical History:  Procedure Laterality Date  . COLONOSCOPY W/ BIOPSIES    . ESOPHAGOGASTRODUODENOSCOPY    . HERNIA REPAIR Bilateral   . OPEN REDUCTION INTERNAL FIXATION (ORIF) DISTAL RADIAL FRACTURE Right 02/11/2018   Procedure: OPEN REDUCTION INTERNAL FIXATION (ORIF) DISTAL RADIAL FRACTURE;  Surgeon: Milly Jakob, MD;  Location: [facility];  Service: Orthopedics;  Laterality: Right;  . TONSILLECTOMY          Home Medications    Prior to Admission medications   Medication Sig Start Date End Date Taking? Authorizing Provider  acetaminophen (TYLENOL) 500 MG tablet Take 1,000 mg by mouth every 6 (six) hours as needed for moderate pain.   Yes [provider]  amoxicillin (AMOXIL) 500 MG capsule Take 1 capsule (500 mg total) by mouth 3 (three) times daily. 05/21/18  Yes [name], MD  chlordiazePOXIDE (LIBRIUM) 25 MG capsule 50m PO TID x 1D, then 25-525mPO BID X 1D, then 25-5037mO QD X 1D 05/25/18  Yes Nanavati, Ankit, MD  lisinopril (PRINIVIL,ZESTRIL) 10 MG tablet Take 1 tablet (10 mg total) by mouth daily. Patient taking differently: Take 10 mg by mouth  at bedtime.  08/21/17 05/27/18 Yes Lacroce, Hulen Shouts, MD  LORazepam (ATIVAN) 1 MG tablet Take 1 tablet by mouth 3 (three) times daily as needed for anxiety. 05/06/18  Yes [provider]  ondansetron (ZOFRAN ODT) 8 MG disintegrating tablet Take 1 tablet (8 mg total) by mouth every 8 (eight) hours as needed for nausea. 05/25/18  Yes Nanavati, Ankit, MD  pantoprazole (PROTONIX) 40 MG tablet Take 1 tablet (40 mg total) by mouth daily. 04/24/14  Yes Janith Lima, MD  sucralfate (CARAFATE) 1 G tablet Take 1 tablet (1 g total) by mouth 4 (four) times daily -  with meals and at  bedtime. Chew before swallowing Patient taking differently: Take 1 g by mouth 2 (two) times daily. Chew before swallowing 12/17/13  Yes Janith Lima, MD  verapamil (CALAN-SR) 240 MG CR tablet Take 240 mg by mouth at bedtime. 02/09/17  Yes [provider]  zolpidem (AMBIEN) 10 MG tablet Take 10 mg by mouth at bedtime.   Yes [provider]    Family History Family History  Problem Relation Age of Onset  . Anxiety disorder Mother   . Congestive Heart Failure Mother   . Crohn's disease Mother   . Colon cancer Mother 24  . Arthritis Father   . High blood pressure Father     Social History Social History   Tobacco Use  . Smoking status: Former Smoker    Types: Cigarettes    Last attempt to quit: 2014    Years since quitting: 5.9  . Smokeless tobacco: Never Used  Substance Use Topics  . Alcohol use: Yes    Comment: daily/ 3-4 beers, has not has any in a few days  . Drug use: No     Allergies   Diphenhydramine hcl; Flexeril [cyclobenzaprine]; Hctz [hydrochlorothiazide]; Nsaids; and Hydroxyzine   Review of Systems Review of Systems 10 Systems reviewed and are negative for acute change except as noted in the HPI.   Physical Exam Updated Vital Signs BP (!) 176/87   Pulse 77   Temp (!) 97.5 F (36.4 C) (Oral)   Resp 15   Ht 5' 11"  (1.803 m)   Wt 79.4 kg   SpO2 98%   BMI 24.41 kg/m   Physical Exam Constitutional:      Comments:   I entered the room from an angle from which he could not observe me coming in initially due to the placement of the room curtain.  Patient was calmly lying in the bed.  There was no signs of agitation.  His extremities were still he was awake and looking around in absolutely no distress, there is a cup with straw at bedside.  There is an empty emesis bag.  Once I entered the room, within seconds he began being jittery and moving his hands all about and twitching his leg slightly with rapid speech and description of his HPI.    HENT:     Head: Normocephalic and atraumatic.     Nose: Nose normal.     Mouth/Throat:     Mouth: Mucous membranes are moist.  Eyes:     Extraocular Movements: Extraocular movements intact.     Conjunctiva/sclera: Conjunctivae normal.     Pupils: Pupils are equal, round, and reactive to light.  Cardiovascular:     Rate and Rhythm: Normal rate and regular rhythm.     Pulses: Normal pulses.  Pulmonary:     Effort: Pulmonary effort is normal.  Breath sounds: Normal breath sounds.  Abdominal:     General: Bowel sounds are normal. There is no distension.     Palpations: Abdomen is soft.     Tenderness: There is no guarding.     Comments: Patient's abdomen is completely soft.  He was reporting pain generally but as we are engaged in conversation he is not expressing any pain with deep palpation throughout the abdomen.  Musculoskeletal: Normal range of motion.        General: No swelling, tenderness, deformity or signs of injury.     Right lower leg: No edema.     Left lower leg: No edema.     Comments: Patient has no peripheral edema.  Lower extremities are in excellent condition.  Calves are soft.  Patient has good and normal skin of the lower extremities with normal hair growth.  No signs of chronic peripheral edema.  Skin:    General: Skin is warm and dry.  Neurological:     General: No focal deficit present.     Mental Status: He is oriented to person, place, and time.     Motor: No weakness.     Coordination: Coordination normal.  Psychiatric:     Comments: Patient is anxious when interacting with me.      ED Treatments / Results  Labs (all labs ordered are listed, but only abnormal results are displayed) Labs Reviewed - No data to display  EKG None  Radiology No results found.  Procedures Procedures (including critical care time)  Medications Ordered in ED Medications - No data to display   Initial Impression / Assessment and Plan / ED Course  I have  reviewed the triage vital signs and the nursing notes.  Pertinent labs & imaging results that were available during my care of the patient were reviewed by me and considered in my medical decision making (see chart for details).    Patient has been seen recurrently.  I have reviewed his history of present illness.  Patient has had 3 CT scans of this year.  CT Angio of the chest shows no acute abnormalities.  CT abdomen with contrast (06/11/2017) and CT renal stone study ([id number]) did not identify any acute findings.  Patient has sigmoid diverticulosis, ankylosing spondylitis and note of prostate abutting urinary bladder.  These all need to be followed up on outpatient basis.  By today's presentation I do not suspect acute diverticulitis.  Patient's abdomen is completely soft.  He is afebrile.  He had CBC 2 days ago with normal white count.  Patient has history of chronic abdominal pain.  At this time based on patient's clinical appearance and review of prior studies, I do not suspect acute etiology. Patient complains of anxiety.  He did not have anxious appearance prior to being aware of me in the room.  I do not suspect acute physiological alcohol withdrawal.  He was given Librium 2 days ago.  This time, I do not feel that patient is a candidate for any additional benzodiazepine or outpatient sedative medication use.  Most importantly, patient needs to follow-up and be managed by his social workers for outpatient evaluations.  Patient reports intent to do so.  Final Clinical Impressions(s) / ED Diagnoses   Final diagnoses:  Chronic pain syndrome  Anxiety  Somatic complaints, multiple    ED Discharge Orders    None       Charlesetta Shanks, MD 05/27/18 1113

## 2018-05-27 NOTE — ED Notes (Signed)
Bed: IR67 Expected date:  Expected time:  Means of arrival:  Comments: Thinks they have cancer

## 2018-05-27 NOTE — ED Notes (Addendum)
Pt called out stating that he is "very anxious and hasn't slept in 3-4 days" Pt asked for the rail to be let down because he "feels closed in" Rail lowered. Pt instructed not to get up out of bed without assistance. Pts call bell within reach. Pt verbalizes understanding. Provider made aware of pts complaints. Provider to bedside asap.

## 2018-06-03 ENCOUNTER — Encounter: Payer: Self-pay | Admitting: Internal Medicine

## 2018-06-03 ENCOUNTER — Emergency Department (HOSPITAL_COMMUNITY)
Admission: EM | Admit: 2018-06-03 | Discharge: 2018-06-03 | Disposition: A | Discharge status: Home / Self Care | Payer: Medicaid Other | Attending: Emergency Medicine | Admitting: Emergency Medicine

## 2018-06-03 ENCOUNTER — Encounter (HOSPITAL_COMMUNITY): Payer: Self-pay | Admitting: Emergency Medicine

## 2018-06-03 DIAGNOSIS — K6289 Other specified diseases of anus and rectum: Secondary | ICD-10-CM | POA: Insufficient documentation

## 2018-06-03 DIAGNOSIS — R519 Headache, unspecified: Secondary | ICD-10-CM

## 2018-06-03 DIAGNOSIS — R51 Headache: Secondary | ICD-10-CM | POA: Diagnosis present

## 2018-06-03 DIAGNOSIS — Z87891 Personal history of nicotine dependence: Secondary | ICD-10-CM | POA: Insufficient documentation

## 2018-06-03 LAB — URINALYSIS, ROUTINE W REFLEX MICROSCOPIC
Bilirubin Urine: NEGATIVE
GLUCOSE, UA: NEGATIVE mg/dL
KETONES UR: NEGATIVE mg/dL
LEUKOCYTES UA: NEGATIVE
NITRITE: NEGATIVE
Protein, ur: NEGATIVE mg/dL
Specific Gravity, Urine: 1.009 (ref 1.005–1.030)
pH: 7 (ref 5.0–8.0)

## 2018-06-03 LAB — CBC WITH DIFFERENTIAL/PLATELET
Abs Immature Granulocytes: 0.04 10*3/uL (ref 0.00–0.07)
Basophils Absolute: 0.1 10*3/uL (ref 0.0–0.1)
Basophils Relative: 1 %
Eosinophils Absolute: 0.1 10*3/uL (ref 0.0–0.5)
Eosinophils Relative: 1 %
HCT: 43.6 % (ref 39.0–52.0)
Hemoglobin: 14.2 g/dL (ref 13.0–17.0)
Immature Granulocytes: 0 %
LYMPHS PCT: 14 %
Lymphs Abs: 1.5 10*3/uL (ref 0.7–4.0)
MCH: 30.5 pg (ref 26.0–34.0)
MCHC: 32.6 g/dL (ref 30.0–36.0)
MCV: 93.6 fL (ref 80.0–100.0)
Monocytes Absolute: 0.8 10*3/uL (ref 0.1–1.0)
Monocytes Relative: 8 %
Neutro Abs: 8.5 10*3/uL — ABNORMAL HIGH (ref 1.7–7.7)
Neutrophils Relative %: 76 %
Platelets: 314 10*3/uL (ref 150–400)
RBC: 4.66 MIL/uL (ref 4.22–5.81)
RDW: 12.7 % (ref 11.5–15.5)
WBC: 11 10*3/uL — ABNORMAL HIGH (ref 4.0–10.5)
nRBC: 0 % (ref 0.0–0.2)

## 2018-06-03 LAB — BASIC METABOLIC PANEL
Anion gap: 11 (ref 5–15)
BUN: 5 mg/dL — ABNORMAL LOW (ref 8–23)
CO2: 25 mmol/L (ref 22–32)
Calcium: 9.1 mg/dL (ref 8.9–10.3)
Chloride: 98 mmol/L (ref 98–111)
Creatinine, Ser: 0.76 mg/dL (ref 0.61–1.24)
GFR calc non Af Amer: >60 mL/min (ref >60)
Glucose, Bld: 98 mg/dL (ref 70–99)
Potassium: 3.9 mmol/L (ref 3.5–5.1)
SODIUM: 134 mmol/L — AB (ref 135–145)

## 2018-06-03 MED ORDER — ACETAMINOPHEN 325 MG PO TABS
650.0000 mg | ORAL_TABLET | Freq: Once | ORAL | Status: AC
  Administered 2018-06-03: 650 mg via ORAL
  Filled 2018-06-03: qty 2

## 2018-06-03 MED ORDER — LORAZEPAM 1 MG PO TABS
1.0000 mg | ORAL_TABLET | Freq: Once | ORAL | Status: AC
  Administered 2018-06-03: 1 mg via ORAL
  Filled 2018-06-03: qty 1

## 2018-06-03 NOTE — ED Provider Notes (Signed)
Emergency Department Provider Note   I have reviewed the triage vital signs and the nursing notes.   HISTORY  Chief Complaint Headache (left side )   HPI Anthony Lamb is a 62 y.o. male with multiple medical problems documented below the presents emergency today with multiple complaints has been seen multiple times in the last month or 2 with similar symptoms.  Patient states he has some type of prostate issues causing him to have some abdominal pain that is burning and causes nausea.  This is been ongoing and is not getting any better she presents here for further evaluation.  He also describes he has a headache and is dehydrated from not eating and drinking very much.  States is not vomiting he just does not have an appetite does not want to eat much.  No diarrhea or constipation.  No recent trauma.  No recent fevers.  No other associated symptoms. No other associated or modifying symptoms.    Past Medical History:  Diagnosis Date  . Alcohol abuse, in remission   . Anemia   . Ankylosing spondylitis ([place])   . Anxiety   . Chronic diarrhea   . Chronic pain   . Colitis   . Depression   . Esophagitis   . Gastritis   . GERD (gastroesophageal reflux disease)   . Hypertension   . IBS (irritable bowel syndrome)   . Poor dentition   . SIADH (syndrome of inappropriate ADH production) [facility])     Patient Active Problem List   Diagnosis Date Noted  . Alcohol withdrawal ([place]) 02/08/2018  . IBS (irritable bowel syndrome)   . Hypertension   . Colitis   . Adjustment disorder with mixed anxiety and depressed mood 05/11/2017  . Alcohol abuse   . Anxiety   . Gastroesophageal reflux disease   . Osteopenia determined by x-ray 08/06/2014  . Stress fracture of calcaneus 08/06/2014  . Vitamin D deficiency 12/18/2013  . Dementia ([place]) 12/18/2013  . Benign microscopic hematuria 07/06/2013  . SIADH (syndrome of inappropriate ADH production) ([place]) 06/22/2013  . Ankylosing spondylitis  ([place]) 06/20/2013  . Malnutrition of moderate degree ([place]) 06/20/2013  . BPH (benign prostatic hyperplasia) 08/22/2010  . History of colonic polyps 08/13/2008  . Essential hypertension 07/03/2007  . PUD (peptic ulcer disease) 07/03/2007    Past Surgical History:  Procedure Laterality Date  . COLONOSCOPY W/ BIOPSIES    . ESOPHAGOGASTRODUODENOSCOPY    . HERNIA REPAIR Bilateral   . OPEN REDUCTION INTERNAL FIXATION (ORIF) DISTAL RADIAL FRACTURE Right 02/11/2018   Procedure: OPEN REDUCTION INTERNAL FIXATION (ORIF) DISTAL RADIAL FRACTURE;  Surgeon: Milly Jakob, MD;  Location: [facility];  Service: Orthopedics;  Laterality: Right;  . TONSILLECTOMY      Current Outpatient Rx  . Order #: [id number] Class: Historical Med  . Order #: [id number] Class: Historical Med  . Order #: [id number] Class: Normal  . Order #: [id number] Class: Normal  . Order #: [id number] Class: Normal  . Order #: [id number] Class: Historical Med  . Order #: [id number] Class: Historical Med  . Order #: [id number] Class: Normal    Allergies Diphenhydramine hcl; Flexeril [cyclobenzaprine]; Hctz [hydrochlorothiazide]; Nsaids; and Hydroxyzine  Family History  Problem Relation Age of Onset  . Anxiety disorder Mother   . Congestive Heart Failure Mother   . Crohn's disease Mother   . Colon cancer Mother 35  . Arthritis Father   . High blood pressure Father     Social History Social History   Tobacco Use  . Smoking status: Former  Smoker    Types: Cigarettes    Last attempt to quit: 2014    Years since quitting: 6.0  . Smokeless tobacco: Never Used  Substance Use Topics  . Alcohol use: Yes    Comment: daily/ 3-4 beers, has not has any in a few days  . Drug use: No    Review of Systems  All other systems negative except as documented in the HPI. All pertinent positives and negatives as reviewed in the HPI. ____________________________________________   PHYSICAL EXAM:  VITAL SIGNS: ED Triage Vitals [06/03/18 0646]  Enc  Vitals Group     BP (!) 168/82     Pulse Rate 76     Resp 18     Temp 97.9 F (36.6 C)     Temp Source Oral     SpO2 98 %     Weight 175 lb (79.4 kg)     Height 5' 11"  (1.803 m)     Head Circumference      Peak Flow      Pain Score 8     Pain Loc      Pain Edu?      Excl. in [place]?     Constitutional: Alert and oriented. Well appearing and in no acute distress. Eyes: Conjunctivae are normal. PERRL. EOMI. Head: Atraumatic. Nose: No congestion/rhinnorhea. Mouth/Throat: Mucous membranes are moist.  Oropharynx non-erythematous. Neck: No stridor.  No meningeal signs.   Cardiovascular: Normal rate, regular rhythm. Good peripheral circulation. Grossly normal heart sounds.   Respiratory: Normal respiratory effort.  No retractions. Lungs CTAB. Gastrointestinal: Soft and nontender. No distention.  Musculoskeletal: No lower extremity tenderness nor edema. No gross deformities of extremities. Neurologic:  Normal speech and language. No gross focal neurologic deficits are appreciated.  Skin:  Skin is warm, dry and intact. No rash noted.   ____________________________________________   LABS (all labs ordered are listed, but only abnormal results are displayed)  Labs Reviewed  CBC WITH DIFFERENTIAL/PLATELET - Abnormal; Notable for the following components:      Result Value   WBC 11.0 (*)    Neutro Abs 8.5 (*)    All other components within normal limits  BASIC METABOLIC PANEL - Abnormal; Notable for the following components:   Sodium 134 (*)    BUN 5 (*)    All other components within normal limits  URINALYSIS, ROUTINE W REFLEX MICROSCOPIC - Abnormal; Notable for the following components:   Hgb urine dipstick SMALL (*)    Bacteria, UA RARE (*)    All other components within normal limits   ____________________________________________  EKG   EKG Interpretation  Date/Time:  Monday June 03 2018 09:18:25 EST Ventricular Rate:  68 PR Interval:    QRS Duration: 109 QT  Interval:  440 QTC Calculation: 468 R Axis:   55 Text Interpretation:  Sinus rhythm No significant change since last tracing Confirmed by Merrily Pew [phone]) on 06/03/2018 9:47:25 AM       ____________________________________________  RADIOLOGY  No results found.  ____________________________________________   PROCEDURES  Procedure(s) performed:   Procedures   ____________________________________________   INITIAL IMPRESSION / ASSESSMENT AND PLAN / ED COURSE  She does have a history of SIADH will check kidney function and electrolytes.  We will give some fluids in the meantime and 1 dose of his home medications which could be related to his symptoms of he was withdrawing from them.  Otherwise unless any emergent causes for symptoms this time.  Low likelihood for meningitis, appendicitis,  stroke, colitis or obstruction is causes for symptoms.  No indication for imaging at this time as has been done multiple times in the past.  Work-up unremarkable.  At time of discharge patient complained pain all over and I refused to give him any tramadol or other pain medication.  Shortly after this he stated some vague suicidal ideation to the nurse Raquel Sarna back to reevaluate him he stated that he is just frustrated with everything is going on and he feels like he is dying but is having trouble seeing specialist.  I discussed with him that if he was actively dying I would not be discharging him from the hospital.  If he does not take care of these things he will likely have worsening issues.  He denies suicidality at this time and says it was just out of frustration.  He knows to come back if he is actively suicidal.  He will follow-up with his primary doctor and other specialists for further management of his multiple symptoms.     Pertinent labs & imaging results that were available during my care of the patient were reviewed by me and considered in my medical decision making (see chart for  details).  ____________________________________________  FINAL CLINICAL IMPRESSION(S) / ED DIAGNOSES  Final diagnoses:  Rectal pain  Nonintractable headache, unspecified chronicity pattern, unspecified headache type     MEDICATIONS GIVEN DURING THIS VISIT:  Medications  LORazepam (ATIVAN) tablet 1 mg (1 mg Oral Given 06/03/18 0752)  acetaminophen (TYLENOL) tablet 650 mg (650 mg Oral Given 06/03/18 0931)     NEW OUTPATIENT MEDICATIONS STARTED DURING THIS VISIT:  Discharge Medication List as of 06/03/2018  9:28 AM      Note:  This note was prepared with assistance of Dragon voice recognition software. Occasional wrong-word or sound-a-like substitutions may have occurred due to the inherent limitations of voice recognition software.   Merrily Pew, MD 06/04/18 [phone]

## 2018-06-03 NOTE — ED Notes (Addendum)
EKG was given to MD Messener.

## 2018-06-03 NOTE — ED Notes (Signed)
Patient given discharge teaching and verbalized understanding. Patient was taken out of ED and placed in the lobby to wait for a cab voucher.

## 2018-06-03 NOTE — ED Notes (Signed)
Spoke with [facility] Maryam who verbalizes will speak with pt and give cab voucher.

## 2018-06-03 NOTE — ED Triage Notes (Signed)
Pt comes to ed via ems, c/o of headache with number of complains, asking for anxiety and sleep meds refilled, pt comes from home, walked with cane w ems. V/s 140/80 hr 80, rr18, spo2 98, cbg 110.  States groin, rectal burning, tenderness in stomach. Nausea and diarrhea x 1 week, headache times 2 days.

## 2018-06-21 ENCOUNTER — Emergency Department (HOSPITAL_COMMUNITY): Payer: Medicaid Other

## 2018-06-21 ENCOUNTER — Emergency Department (HOSPITAL_COMMUNITY)
Admission: EM | Admit: 2018-06-21 | Discharge: 2018-06-21 | Disposition: A | Discharge status: Home / Self Care | Payer: Medicaid Other | Attending: Emergency Medicine | Admitting: Emergency Medicine

## 2018-06-21 ENCOUNTER — Encounter (HOSPITAL_COMMUNITY): Payer: Self-pay | Admitting: *Deleted

## 2018-06-21 DIAGNOSIS — Y999 Unspecified external cause status: Secondary | ICD-10-CM | POA: Diagnosis not present

## 2018-06-21 DIAGNOSIS — Z87891 Personal history of nicotine dependence: Secondary | ICD-10-CM | POA: Insufficient documentation

## 2018-06-21 DIAGNOSIS — Y939 Activity, unspecified: Secondary | ICD-10-CM | POA: Insufficient documentation

## 2018-06-21 DIAGNOSIS — S63501A Unspecified sprain of right wrist, initial encounter: Secondary | ICD-10-CM | POA: Diagnosis not present

## 2018-06-21 DIAGNOSIS — R1030 Lower abdominal pain, unspecified: Secondary | ICD-10-CM

## 2018-06-21 DIAGNOSIS — R05 Cough: Secondary | ICD-10-CM | POA: Diagnosis not present

## 2018-06-21 DIAGNOSIS — R519 Headache, unspecified: Secondary | ICD-10-CM

## 2018-06-21 DIAGNOSIS — X58XXXA Exposure to other specified factors, initial encounter: Secondary | ICD-10-CM | POA: Diagnosis not present

## 2018-06-21 DIAGNOSIS — Z79899 Other long term (current) drug therapy: Secondary | ICD-10-CM | POA: Diagnosis not present

## 2018-06-21 DIAGNOSIS — R51 Headache: Secondary | ICD-10-CM | POA: Insufficient documentation

## 2018-06-21 DIAGNOSIS — I10 Essential (primary) hypertension: Secondary | ICD-10-CM | POA: Diagnosis not present

## 2018-06-21 DIAGNOSIS — R109 Unspecified abdominal pain: Secondary | ICD-10-CM | POA: Diagnosis present

## 2018-06-21 DIAGNOSIS — Y929 Unspecified place or not applicable: Secondary | ICD-10-CM | POA: Insufficient documentation

## 2018-06-21 LAB — COMPREHENSIVE METABOLIC PANEL
ALT: 21 U/L (ref 0–44)
AST: 22 U/L (ref 15–41)
Albumin: 4.4 g/dL (ref 3.5–5.0)
Alkaline Phosphatase: 77 U/L (ref 38–126)
Anion gap: 12 (ref 5–15)
BUN: 5 mg/dL — ABNORMAL LOW (ref 8–23)
CO2: 24 mmol/L (ref 22–32)
Calcium: 9 mg/dL (ref 8.9–10.3)
Chloride: 95 mmol/L — ABNORMAL LOW (ref 98–111)
Creatinine, Ser: 0.74 mg/dL (ref 0.61–1.24)
GFR calc non Af Amer: >60 mL/min (ref >60)
Glucose, Bld: 98 mg/dL (ref 70–99)
Potassium: 4.2 mmol/L (ref 3.5–5.1)
Sodium: 131 mmol/L — ABNORMAL LOW (ref 135–145)
Total Bilirubin: 0.9 mg/dL (ref 0.3–1.2)
Total Protein: 7.9 g/dL (ref 6.5–8.1)

## 2018-06-21 LAB — CBC
HCT: 48.5 % (ref 39.0–52.0)
HEMOGLOBIN: 15.8 g/dL (ref 13.0–17.0)
MCH: 30.1 pg (ref 26.0–34.0)
MCHC: 32.6 g/dL (ref 30.0–36.0)
MCV: 92.4 fL (ref 80.0–100.0)
NRBC: 0 % (ref 0.0–0.2)
Platelets: 225 10*3/uL (ref 150–400)
RBC: 5.25 MIL/uL (ref 4.22–5.81)
RDW: 13 % (ref 11.5–15.5)
WBC: 11.4 10*3/uL — ABNORMAL HIGH (ref 4.0–10.5)

## 2018-06-21 LAB — URINALYSIS, ROUTINE W REFLEX MICROSCOPIC
BILIRUBIN URINE: NEGATIVE
Bacteria, UA: NONE SEEN
Glucose, UA: NEGATIVE mg/dL
KETONES UR: NEGATIVE mg/dL
Leukocytes, UA: NEGATIVE
Nitrite: NEGATIVE
Protein, ur: NEGATIVE mg/dL
Specific Gravity, Urine: 1.005 (ref 1.005–1.030)
pH: 7 (ref 5.0–8.0)

## 2018-06-21 LAB — LIPASE, BLOOD: Lipase: 34 U/L (ref 11–51)

## 2018-06-21 MED ORDER — ONDANSETRON HCL 4 MG/2ML IJ SOLN
4.0000 mg | Freq: Once | INTRAMUSCULAR | Status: AC
  Administered 2018-06-21: 4 mg via INTRAVENOUS
  Filled 2018-06-21: qty 2

## 2018-06-21 MED ORDER — SODIUM CHLORIDE 0.9 % IV BOLUS
1000.0000 mL | Freq: Once | INTRAVENOUS | Status: AC
  Administered 2018-06-21: 1000 mL via INTRAVENOUS

## 2018-06-21 MED ORDER — IOPAMIDOL (ISOVUE-300) INJECTION 61%
INTRAVENOUS | Status: AC
  Filled 2018-06-21: qty 100

## 2018-06-21 MED ORDER — IOPAMIDOL (ISOVUE-300) INJECTION 61%
100.0000 mL | Freq: Once | INTRAVENOUS | Status: AC | PRN
  Administered 2018-06-21: 100 mL via INTRAVENOUS

## 2018-06-21 MED ORDER — SODIUM CHLORIDE (PF) 0.9 % IJ SOLN
INTRAMUSCULAR | Status: AC
  Filled 2018-06-21: qty 50

## 2018-06-21 MED ORDER — SODIUM CHLORIDE 0.9% FLUSH
3.0000 mL | Freq: Once | INTRAVENOUS | Status: AC
  Administered 2018-06-21: 3 mL via INTRAVENOUS

## 2018-06-21 MED ORDER — FENTANYL CITRATE (PF) 100 MCG/2ML IJ SOLN
50.0000 ug | Freq: Once | INTRAMUSCULAR | Status: AC
  Administered 2018-06-21: 50 ug via INTRAVENOUS
  Filled 2018-06-21: qty 2

## 2018-06-21 MED ORDER — LORAZEPAM 1 MG PO TABS
1.0000 mg | ORAL_TABLET | Freq: Once | ORAL | Status: AC
  Administered 2018-06-21: 1 mg via ORAL
  Filled 2018-06-21: qty 1

## 2018-06-21 NOTE — ED Triage Notes (Signed)
Pt comes from home by [facility] for abd pains with n/v/d for week.

## 2018-06-21 NOTE — ED Provider Notes (Signed)
[facility] DEPT Provider Note   CSN: [id number] Arrival date & time: 06/21/18  0725     History   Chief Complaint Chief Complaint  Patient presents with  . Abdominal Pain  . Emesis  . Diarrhea    HPI HOLLAND NICKSON is a 63 y.o. male.  HPI  63 year old male presents with abdominal pain, vomiting, diarrhea.  Patient states he is been having difficulty swallowing with nausea and progressively worsening weakness for months.  However recently over the last 2 days he developed vomiting, diffuse abdominal pain, and diarrhea.  There is no blood in his stools.  He is also been having sweats at night.  He feels very lightheaded which is a progressively worsening problem and is also having a left-sided headache.  He states the headache typically occurs whenever he gets lightheaded/dehydrated but has been happening more more over the last several months.  He feels short of breath when lying flat.  He is had a little bit of a cough as well over the last couple days.  Patient states he is very anxious about all of his symptoms.  Past Medical History:  Diagnosis Date  . Alcohol abuse, in remission   . Anemia   . Ankylosing spondylitis ([place])   . Anxiety   . Chronic diarrhea   . Chronic pain   . Colitis   . Depression   . Esophagitis   . Gastritis   . GERD (gastroesophageal reflux disease)   . Hypertension   . IBS (irritable bowel syndrome)   . Poor dentition   . SIADH (syndrome of inappropriate ADH production) [facility])     Patient Active Problem List   Diagnosis Date Noted  . Alcohol withdrawal ([place]) 02/08/2018  . IBS (irritable bowel syndrome)   . Hypertension   . Colitis   . Adjustment disorder with mixed anxiety and depressed mood 05/11/2017  . Alcohol abuse   . Anxiety   . Gastroesophageal reflux disease   . Osteopenia determined by x-ray 08/06/2014  . Stress fracture of calcaneus 08/06/2014  . Vitamin D deficiency 12/18/2013  . Dementia  ([place]) 12/18/2013  . Benign microscopic hematuria 07/06/2013  . SIADH (syndrome of inappropriate ADH production) ([place]) 06/22/2013  . Ankylosing spondylitis ([place]) 06/20/2013  . Malnutrition of moderate degree ([place]) 06/20/2013  . BPH (benign prostatic hyperplasia) 08/22/2010  . History of colonic polyps 08/13/2008  . Essential hypertension 07/03/2007  . PUD (peptic ulcer disease) 07/03/2007    Past Surgical History:  Procedure Laterality Date  . COLONOSCOPY W/ BIOPSIES    . ESOPHAGOGASTRODUODENOSCOPY    . HERNIA REPAIR Bilateral   . OPEN REDUCTION INTERNAL FIXATION (ORIF) DISTAL RADIAL FRACTURE Right 02/11/2018   Procedure: OPEN REDUCTION INTERNAL FIXATION (ORIF) DISTAL RADIAL FRACTURE;  Surgeon: Milly Jakob, MD;  Location: [facility];  Service: Orthopedics;  Laterality: Right;  . TONSILLECTOMY          Home Medications    Prior to Admission medications   Medication Sig Start Date End Date Taking? Authorizing Provider  acetaminophen (TYLENOL) 500 MG tablet Take 1,000 mg by mouth every 6 (six) hours as needed for moderate pain.   Yes [provider]  lisinopril (PRINIVIL,ZESTRIL) 10 MG tablet Take 1 tablet (10 mg total) by mouth daily. Patient taking differently: Take 10 mg by mouth at bedtime.  08/21/17 06/21/18 Yes Lacroce, Hulen Shouts, MD  LORazepam (ATIVAN) 1 MG tablet Take 1 tablet by mouth 3 (three) times daily as needed for anxiety. 05/06/18  Yes [provider]  Multiple Vitamin (MULTIVITAMIN WITH MINERALS) TABS tablet Take 1 tablet by mouth daily.   Yes [provider]  ondansetron (ZOFRAN ODT) 8 MG disintegrating tablet Take 1 tablet (8 mg total) by mouth every 8 (eight) hours as needed for nausea. 05/25/18  Yes Nanavati, Ankit, MD  pantoprazole (PROTONIX) 40 MG tablet Take 1 tablet (40 mg total) by mouth daily. 04/24/14  Yes Janith Lima, MD  sucralfate (CARAFATE) 1 G tablet Take 1 tablet (1 g total) by mouth 4 (four) times daily -  with meals and at  bedtime. Chew before swallowing Patient taking differently: Take 1 g by mouth 2 (two) times daily. Chew before swallowing 12/17/13  Yes Janith Lima, MD  verapamil (CALAN-SR) 240 MG CR tablet Take 240 mg by mouth at bedtime. 02/09/17  Yes [provider]  zolpidem (AMBIEN) 10 MG tablet Take 10 mg by mouth at bedtime.   Yes [provider]  traZODone (DESYREL) 150 MG tablet Take 150 mg by mouth at bedtime. 05/30/18   [provider]    Family History Family History  Problem Relation Age of Onset  . Anxiety disorder Mother   . Congestive Heart Failure Mother   . Crohn's disease Mother   . Colon cancer Mother 24  . Arthritis Father   . High blood pressure Father     Social History Social History   Tobacco Use  . Smoking status: Former Smoker    Types: Cigarettes    Last attempt to quit: 2014    Years since quitting: 6.0  . Smokeless tobacco: Never Used  Substance Use Topics  . Alcohol use: Yes    Comment: daily/ 3-4 beers, has not has any in a few days  . Drug use: No     Allergies   Diphenhydramine hcl; Flexeril [cyclobenzaprine]; Hctz [hydrochlorothiazide]; Nsaids; and Hydroxyzine   Review of Systems Review of Systems  Constitutional: Positive for diaphoresis. Negative for fever.  Respiratory: Positive for cough and shortness of breath.   Cardiovascular: Negative for chest pain.  Gastrointestinal: Positive for abdominal pain, diarrhea, nausea and vomiting. Negative for blood in stool.  Neurological: Positive for light-headedness and headaches.  All other systems reviewed and are negative.    Physical Exam Updated Vital Signs BP (!) 160/84   Pulse 72   Temp 98 F (36.7 C) (Oral)   Resp 16   SpO2 98%   Physical Exam Vitals signs and nursing note reviewed.  Constitutional:      General: He is not in acute distress.    Appearance: He is well-developed. He is not ill-appearing or diaphoretic.  HENT:     Head: Normocephalic and  atraumatic.     Right Ear: External ear normal.     Left Ear: External ear normal.     Nose: Nose normal.     Mouth/Throat:     Mouth: Mucous membranes are dry.  Eyes:     General:        Right eye: No discharge.        Left eye: No discharge.     Extraocular Movements: Extraocular movements intact.     Pupils: Pupils are equal, round, and reactive to light.  Neck:     Musculoskeletal: Neck supple.  Cardiovascular:     Rate and Rhythm: Normal rate and regular rhythm.     Heart sounds: Normal heart sounds.  Pulmonary:     Effort: Pulmonary effort is normal.  Breath sounds: Normal breath sounds.  Abdominal:     Palpations: Abdomen is soft.     Tenderness: There is abdominal tenderness in the right lower quadrant, periumbilical area, suprapubic area and left lower quadrant.  Skin:    General: Skin is warm and dry.  Neurological:     Mental Status: He is alert and oriented to person, place, and time.     Comments: CN 3-12 grossly intact. 5/5 strength in all 4 extremities. Grossly normal sensation. Normal finger to nose.   Psychiatric:        Mood and Affect: Mood is anxious.      ED Treatments / Results  Labs (all labs ordered are listed, but only abnormal results are displayed) Labs Reviewed  COMPREHENSIVE METABOLIC PANEL - Abnormal; Notable for the following components:      Result Value   Sodium 131 (*)    Chloride 95 (*)    BUN 5 (*)    All other components within normal limits  CBC - Abnormal; Notable for the following components:   WBC 11.4 (*)    All other components within normal limits  URINALYSIS, ROUTINE W REFLEX MICROSCOPIC - Abnormal; Notable for the following components:   Color, Urine STRAW (*)    Hgb urine dipstick SMALL (*)    All other components within normal limits  LIPASE, BLOOD    EKG EKG Interpretation  Date/Time:  Friday June 21 2018 07:38:43 EST Ventricular Rate:  77 PR Interval:    QRS Duration: 107 QT Interval:  407 QTC  Calculation: 461 R Axis:   9 Text Interpretation:  Sinus rhythm no acute ST/T changes no signifincant change since Dec 2019 Confirmed by Sherwood Gambler [phone]) on 06/21/2018 8:55:03 AM   Radiology Dg Chest 2 View  Result Date: 06/21/2018 CLINICAL DATA:  Cough with nausea and vomiting EXAM: CHEST - 2 VIEW COMPARISON:  May 15, 2018 FINDINGS: There is no edema or consolidation. The heart size and pulmonary vascularity are normal. No adenopathy. There is aortic atherosclerosis. No evident bone lesions. IMPRESSION: No edema or consolidation.  There is aortic atherosclerosis. Aortic Atherosclerosis (ICD10-I70.0). Electronically Signed   By: Lowella Grip III M.D.   On: 06/21/2018 08:37   Dg Wrist Complete Right  Result Date: 06/21/2018 CLINICAL DATA:  Injury 2 days ago, RIGHT wrist pain after playing door open heart, prior wrist surgery 4 months ago for fracture EXAM: RIGHT WRIST - COMPLETE 3+ VIEW COMPARISON:  03/04/2018 FINDINGS: Diffuse osseous demineralization. Joint space narrowing at first [facility] joint and at distal pole of scaphoid. Minimal radiocarpal joint space narrowing. Nonunion of ulnar styloid fracture fragment. Volar plate and multiple screws at the distal radius post ORIF of a distal radial metaphyseal fracture which demonstrates interval healing. No acute fracture, dislocation, or bone destruction. IMPRESSION: No acute abnormalities. Osseous demineralization with postsurgical changes of distal RIGHT radial ORIF. Electronically Signed   By: Lavonia Dana M.D.   On: 06/21/2018 11:13   Ct Head Wo Contrast  Result Date: 06/21/2018 CLINICAL DATA:  Severe headache with nausea EXAM: CT HEAD WITHOUT CONTRAST TECHNIQUE: Contiguous axial images were obtained from the base of the skull through the vertex without intravenous contrast. COMPARISON:  August 19, 2017 FINDINGS: Brain: Ventricles and sulci are within normal limits for age. There is no intracranial mass, hemorrhage, extra-axial fluid  collection, or midline shift. Brain parenchyma appears unremarkable. No evident acute infarct. Vascular: No hyperdense vessel. There is no appreciable vascular calcification. Skull: The bony calvarium appears intact.  Sinuses/Orbits: There is slight mucosal thickening in several ethmoid air cells. Other visualized paranasal sinuses are clear. Visualized orbits appear symmetric bilaterally. Other: Mastoid air cells are clear. IMPRESSION: Slight mucosal thickening in several ethmoid air cells. Study otherwise unremarkable. Electronically Signed   By: Lowella Grip III M.D.   On: 06/21/2018 10:09   Ct Abdomen Pelvis W Contrast  Result Date: 06/21/2018 CLINICAL DATA:  Abdominal pain, nausea, vomiting and diarrhea for 1 week. EXAM: CT ABDOMEN AND PELVIS WITH CONTRAST TECHNIQUE: Multidetector CT imaging of the abdomen and pelvis was performed using the standard protocol following bolus administration of intravenous contrast. CONTRAST:  100 mL ISOVUE-300 IOPAMIDOL (ISOVUE-300) INJECTION 61% COMPARISON:  CT abdomen and pelvis 04/17/2018 and 06/11/2017. FINDINGS: Lower chest: Calcific coronary artery disease is seen. Heart size is normal. No pleural or pericardial effusion. Minimal dependent atelectasis noted. Hepatobiliary: No focal liver abnormality is seen. No gallstones, gallbladder wall thickening, or biliary dilatation. Pancreas: Unremarkable. No pancreatic ductal dilatation or surrounding inflammatory changes. Spleen: Normal in size without focal abnormality. Adrenals/Urinary Tract: Mild thickening of the left adrenal gland compatible with hyperplasia is stable in appearance. The right adrenal gland appears normal. Stomach/Bowel: Mild sigmoid diverticulosis without diverticulitis is identified. The colon is otherwise unremarkable. The stomach, small bowel and appendix appear normal. Vascular/Lymphatic: Aortic atherosclerosis. No enlarged abdominal or pelvic lymph nodes. Reproductive: Prostate is unremarkable.  Other: Tiny fat containing umbilical hernia noted. Musculoskeletal: No acute bony abnormality is identified. Flowing syndesmophyte formation across all imaged vertebral bodies, ankylosis of the facets and ankylosis of the SI joints are consistent with ankylosing spondylitis. IMPRESSION: No acute abnormality abdomen or pelvis. Calcific aortic and coronary atherosclerosis. Mild sigmoid diverticulosis without diverticulitis. Ankylosing spondylitis. Electronically Signed   By: Inge Rise M.D.   On: 06/21/2018 10:15    Procedures Procedures (including critical care time)  Medications Ordered in ED Medications  sodium chloride flush (NS) 0.9 % injection 3 mL (3 mLs Intravenous Given 06/21/18 0833)  sodium chloride 0.9 % bolus 1,000 mL (0 mLs Intravenous Stopped 06/21/18 0934)  fentaNYL (SUBLIMAZE) injection 50 mcg (50 mcg Intravenous Given 06/21/18 0832)  ondansetron (ZOFRAN) injection 4 mg (4 mg Intravenous Given 06/21/18 0832)  LORazepam (ATIVAN) tablet 1 mg (1 mg Oral Given 06/21/18 0832)  iopamidol (ISOVUE-300) 61 % injection 100 mL (100 mLs Intravenous Contrast Given 06/21/18 0948)     Initial Impression / Assessment and Plan / ED Course  I have reviewed the triage vital signs and the nursing notes.  Pertinent labs & imaging results that were available during my care of the patient were reviewed by me and considered in my medical decision making (see chart for details).     Patient's vital signs are stable besides some hypertension.  Otherwise, head CT is benign which was obtained given on and off headaches for a while.  CT abdomen pelvis without acute intra-abdominal emergency.  Later he now tells me that the right wrist that he had previous surgery on he thinks he injured a couple days ago picking something up.  This x-ray is benign.  He appears reasonably screened and I doubt acute emergent condition at this time.  He is not vomiting.  He appears stable for discharge home.  Final Clinical  Impressions(s) / ED Diagnoses   Final diagnoses:  Lower abdominal pain  Sprain of right wrist, initial encounter  Left-sided headache    ED Discharge Orders    None       Sherwood Gambler, MD 06/21/18 1609

## 2018-06-21 NOTE — ED Notes (Signed)
Bed: WA03 Expected date:  Expected time:  Means of arrival:  Comments: EMS-N/V/D

## 2018-06-21 NOTE — Discharge Instructions (Signed)
If you develop continued, recurrent, or worsening headache, fever, neck stiffness, vomiting, blurry or double vision, weakness or numbness in your arms or legs, trouble speaking, or any other new/concerning symptoms then return to the ER for evaluation.   If you develop worsening, continued, or recurrent abdominal pain, uncontrolled vomiting, fever, chest or back pain, or any other new/concerning symptoms then return to the ER for evaluation.

## 2018-06-24 ENCOUNTER — Emergency Department (HOSPITAL_COMMUNITY)
Admission: EM | Admit: 2018-06-24 | Discharge: 2018-06-24 | Disposition: A | Discharge status: Other Acute Inpatient Hospital | Payer: Medicaid Other | Attending: Emergency Medicine | Admitting: Emergency Medicine

## 2018-06-24 ENCOUNTER — Encounter (HOSPITAL_COMMUNITY): Payer: Self-pay | Admitting: *Deleted

## 2018-06-24 ENCOUNTER — Encounter (HOSPITAL_COMMUNITY): Payer: Self-pay

## 2018-06-24 ENCOUNTER — Inpatient Hospital Stay (HOSPITAL_COMMUNITY)
Admission: AD | Admit: 2018-06-24 | Discharge: 2018-06-29 | DRG: 885 | Disposition: A | Discharge status: Home / Self Care | Payer: Medicaid Other | Source: Intra-hospital | Attending: Psychiatry | Admitting: Psychiatry

## 2018-06-24 ENCOUNTER — Other Ambulatory Visit: Payer: Self-pay

## 2018-06-24 DIAGNOSIS — I10 Essential (primary) hypertension: Secondary | ICD-10-CM | POA: Diagnosis present

## 2018-06-24 DIAGNOSIS — E222 Syndrome of inappropriate secretion of antidiuretic hormone: Secondary | ICD-10-CM | POA: Diagnosis present

## 2018-06-24 DIAGNOSIS — F419 Anxiety disorder, unspecified: Secondary | ICD-10-CM | POA: Diagnosis not present

## 2018-06-24 DIAGNOSIS — R45851 Suicidal ideations: Secondary | ICD-10-CM | POA: Diagnosis present

## 2018-06-24 DIAGNOSIS — Z818 Family history of other mental and behavioral disorders: Secondary | ICD-10-CM | POA: Diagnosis not present

## 2018-06-24 DIAGNOSIS — F410 Panic disorder [episodic paroxysmal anxiety] without agoraphobia: Secondary | ICD-10-CM | POA: Diagnosis present

## 2018-06-24 DIAGNOSIS — Z800 Family history of malignant neoplasm of digestive organs: Secondary | ICD-10-CM | POA: Diagnosis not present

## 2018-06-24 DIAGNOSIS — Z79899 Other long term (current) drug therapy: Secondary | ICD-10-CM | POA: Insufficient documentation

## 2018-06-24 DIAGNOSIS — F332 Major depressive disorder, recurrent severe without psychotic features: Secondary | ICD-10-CM | POA: Diagnosis present

## 2018-06-24 DIAGNOSIS — Z888 Allergy status to other drugs, medicaments and biological substances status: Secondary | ICD-10-CM | POA: Diagnosis not present

## 2018-06-24 DIAGNOSIS — G4700 Insomnia, unspecified: Secondary | ICD-10-CM | POA: Diagnosis present

## 2018-06-24 DIAGNOSIS — Z8249 Family history of ischemic heart disease and other diseases of the circulatory system: Secondary | ICD-10-CM

## 2018-06-24 DIAGNOSIS — Z87891 Personal history of nicotine dependence: Secondary | ICD-10-CM | POA: Insufficient documentation

## 2018-06-24 DIAGNOSIS — M459 Ankylosing spondylitis of unspecified sites in spine: Secondary | ICD-10-CM | POA: Diagnosis present

## 2018-06-24 DIAGNOSIS — K580 Irritable bowel syndrome with diarrhea: Secondary | ICD-10-CM | POA: Diagnosis present

## 2018-06-24 DIAGNOSIS — F1020 Alcohol dependence, uncomplicated: Secondary | ICD-10-CM | POA: Insufficient documentation

## 2018-06-24 DIAGNOSIS — K219 Gastro-esophageal reflux disease without esophagitis: Secondary | ICD-10-CM | POA: Diagnosis present

## 2018-06-24 LAB — COMPREHENSIVE METABOLIC PANEL
ALT: 19 U/L (ref 0–44)
AST: 20 U/L (ref 15–41)
Albumin: 4.2 g/dL (ref 3.5–5.0)
Alkaline Phosphatase: 77 U/L (ref 38–126)
Anion gap: 11 (ref 5–15)
BUN: 5 mg/dL — ABNORMAL LOW (ref 8–23)
CO2: 26 mmol/L (ref 22–32)
Calcium: 8.9 mg/dL (ref 8.9–10.3)
Chloride: 93 mmol/L — ABNORMAL LOW (ref 98–111)
Creatinine, Ser: 0.68 mg/dL (ref 0.61–1.24)
GFR calc Af Amer: >60 mL/min (ref >60)
GFR calc non Af Amer: >60 mL/min (ref >60)
Glucose, Bld: 92 mg/dL (ref 70–99)
Potassium: 3.8 mmol/L (ref 3.5–5.1)
Sodium: 130 mmol/L — ABNORMAL LOW (ref 135–145)
Total Bilirubin: 0.5 mg/dL (ref 0.3–1.2)
Total Protein: 7.1 g/dL (ref 6.5–8.1)

## 2018-06-24 LAB — CBC
HCT: 42.6 % (ref 39.0–52.0)
Hemoglobin: 14.1 g/dL (ref 13.0–17.0)
MCH: 30.1 pg (ref 26.0–34.0)
MCHC: 33.1 g/dL (ref 30.0–36.0)
MCV: 91 fL (ref 80.0–100.0)
Platelets: 339 10*3/uL (ref 150–400)
RBC: 4.68 MIL/uL (ref 4.22–5.81)
RDW: 13.2 % (ref 11.5–15.5)
WBC: 12 10*3/uL — ABNORMAL HIGH (ref 4.0–10.5)
nRBC: 0 % (ref 0.0–0.2)

## 2018-06-24 LAB — RAPID URINE DRUG SCREEN, HOSP PERFORMED
Amphetamines: NOT DETECTED
Barbiturates: NOT DETECTED
Benzodiazepines: POSITIVE — AB
Cocaine: NOT DETECTED
Opiates: NOT DETECTED
Tetrahydrocannabinol: NOT DETECTED

## 2018-06-24 LAB — SALICYLATE LEVEL: Salicylate Lvl: <7 mg/dL (ref 2.8–30.0)

## 2018-06-24 LAB — ACETAMINOPHEN LEVEL: Acetaminophen (Tylenol), Serum: <10 ug/mL — ABNORMAL LOW (ref 10–30)

## 2018-06-24 LAB — ETHANOL: Alcohol, Ethyl (B): <10 mg/dL (ref <10)

## 2018-06-24 MED ORDER — LISINOPRIL 10 MG PO TABS
10.0000 mg | ORAL_TABLET | Freq: Every day | ORAL | Status: DC
  Administered 2018-06-25 – 2018-06-29 (×5): 10 mg via ORAL
  Filled 2018-06-24: qty 7
  Filled 2018-06-24: qty 1
  Filled 2018-06-24: qty 7
  Filled 2018-06-24: qty 1
  Filled 2018-06-24: qty 2
  Filled 2018-06-24 (×4): qty 1

## 2018-06-24 MED ORDER — PANTOPRAZOLE SODIUM 40 MG PO TBEC
40.0000 mg | DELAYED_RELEASE_TABLET | Freq: Every day | ORAL | Status: DC
  Administered 2018-06-24: 40 mg via ORAL
  Filled 2018-06-24: qty 2

## 2018-06-24 MED ORDER — LORAZEPAM 1 MG PO TABS
1.0000 mg | ORAL_TABLET | Freq: Once | ORAL | Status: DC
  Administered 2018-06-24: 1 mg via ORAL
  Filled 2018-06-24: qty 1

## 2018-06-24 MED ORDER — LORAZEPAM 2 MG/ML IJ SOLN: 0.0000 mg | Freq: Four times a day (QID) | INTRAMUSCULAR | Status: DC

## 2018-06-24 MED ORDER — LISINOPRIL 10 MG PO TABS
10.0000 mg | ORAL_TABLET | Freq: Every day | ORAL | Status: DC
  Administered 2018-06-24: 10 mg via ORAL
  Filled 2018-06-24: qty 1

## 2018-06-24 MED ORDER — LORAZEPAM 2 MG/ML IJ SOLN: 0.0000 mg | Freq: Two times a day (BID) | INTRAMUSCULAR | Status: DC

## 2018-06-24 MED ORDER — TRAZODONE HCL 50 MG PO TABS: 150.0000 mg | ORAL_TABLET | Freq: Every day | ORAL | Status: DC

## 2018-06-24 MED ORDER — VERAPAMIL HCL ER 240 MG PO TBCR: 240.0000 mg | EXTENDED_RELEASE_TABLET | Freq: Every day | ORAL | Status: DC

## 2018-06-24 MED ORDER — ALUM & MAG HYDROXIDE-SIMETH 200-200-20 MG/5ML PO SUSP
30.0000 mL | ORAL | Status: DC | PRN
  Administered 2018-06-24: 30 mL via ORAL
  Filled 2018-06-24: qty 30

## 2018-06-24 MED ORDER — LORAZEPAM 1 MG PO TABS: 0.0000 mg | ORAL_TABLET | Freq: Four times a day (QID) | ORAL | Status: DC

## 2018-06-24 MED ORDER — SUCRALFATE 1 G PO TABS: 1.0000 g | ORAL_TABLET | Freq: Three times a day (TID) | ORAL | Status: DC

## 2018-06-24 MED ORDER — PANTOPRAZOLE SODIUM 40 MG PO TBEC
40.0000 mg | DELAYED_RELEASE_TABLET | Freq: Every day | ORAL | Status: DC
  Administered 2018-06-25 – 2018-06-29 (×5): 40 mg via ORAL
  Filled 2018-06-24 (×8): qty 1

## 2018-06-24 MED ORDER — THIAMINE HCL 100 MG/ML IJ SOLN: 100.0000 mg | Freq: Every day | INTRAMUSCULAR | Status: DC

## 2018-06-24 MED ORDER — LORAZEPAM 1 MG PO TABS: 0.0000 mg | ORAL_TABLET | Freq: Two times a day (BID) | ORAL | Status: DC

## 2018-06-24 MED ORDER — LORAZEPAM 1 MG PO TABS
1.0000 mg | ORAL_TABLET | Freq: Once | ORAL | Status: AC
  Administered 2018-06-24: 1 mg via ORAL
  Filled 2018-06-24: qty 2

## 2018-06-24 MED ORDER — TRAZODONE HCL 150 MG PO TABS
150.0000 mg | ORAL_TABLET | Freq: Every day | ORAL | Status: DC
  Administered 2018-06-24: 150 mg via ORAL
  Filled 2018-06-24 (×2): qty 1

## 2018-06-24 MED ORDER — TRAZODONE HCL 150 MG PO TABS
150.0000 mg | ORAL_TABLET | Freq: Every evening | ORAL | Status: DC | PRN
  Administered 2018-06-24: 150 mg via ORAL
  Filled 2018-06-24 (×6): qty 1

## 2018-06-24 MED ORDER — ACETAMINOPHEN 325 MG PO TABS
650.0000 mg | ORAL_TABLET | Freq: Four times a day (QID) | ORAL | Status: DC | PRN
  Administered 2018-06-24 – 2018-06-28 (×8): 650 mg via ORAL
  Filled 2018-06-24 (×8): qty 2

## 2018-06-24 MED ORDER — SUCRALFATE 1 G PO TABS
1.0000 g | ORAL_TABLET | Freq: Three times a day (TID) | ORAL | Status: DC
  Administered 2018-06-24 – 2018-06-29 (×19): 1 g via ORAL
  Filled 2018-06-24 (×29): qty 1

## 2018-06-24 MED ORDER — VITAMIN B-1 100 MG PO TABS
100.0000 mg | ORAL_TABLET | Freq: Every day | ORAL | Status: DC
  Administered 2018-06-24: 100 mg via ORAL
  Filled 2018-06-24: qty 1

## 2018-06-24 MED ORDER — VERAPAMIL HCL ER 240 MG PO TBCR
240.0000 mg | EXTENDED_RELEASE_TABLET | Freq: Every day | ORAL | Status: DC
  Administered 2018-06-24 – 2018-06-28 (×5): 240 mg via ORAL
  Filled 2018-06-24: qty 2
  Filled 2018-06-24 (×7): qty 1

## 2018-06-24 MED ORDER — MAGNESIUM HYDROXIDE 400 MG/5ML PO SUSP: 30.0000 mL | Freq: Every day | ORAL | Status: DC | PRN

## 2018-06-24 MED ORDER — TRAZODONE HCL 150 MG PO TABS: 150.0000 mg | ORAL_TABLET | Freq: Every evening | ORAL | Status: DC | PRN

## 2018-06-24 NOTE — Progress Notes (Signed)
Etienne is a 63 year old male pt admitted on voluntary basis. On admission he reports that he made a mistake and reports that he overtook his ativan because he could not sleep. He denies any SI on admission and is able to contract for safety while in the hospital. Demarius is somatically focused, reports feeling dizzy, that he cannot urinate and appears to be at increased risk for falls on admission. He reports that he lives alone in an apartment and has a worker that checks on him 3 days a week. Gurtaj was subsequently placed on a 1-1 due to being at risk for a fall.

## 2018-06-24 NOTE — BH Assessment (Signed)
[facility] Assessment Progress Note  Per Waylan Boga, DNP, this pt requires psychiatric hospitalization at this time.  Benancio Deeds, RN, [facility] has assigned pt to [facility] Rm [id number].  Pt has signed Voluntary Admission and Consent for Treatment, as well as Consent to Release Information to his [facility] case manager, and to a friend, and a notification call has been placed to the provider.  Signed forms have been faxed to [facility].  Pt's nurse has been notified, and agrees to send original paperwork along with pt via [place], and to call report to [phone].  Jalene Mullet, [place] Coordinator [phone]

## 2018-06-24 NOTE — ED Notes (Signed)
ED TO INPATIENT HANDOFF REPORT  Name/Age/Gender Anthony Lamb 63 y.o. male  Code Status    Code Status Orders  (From admission, onward)         Start     Ordered   06/24/18 1359  Full code  Continuous     06/24/18 1444        Code Status History    Date Active Date Inactive Code Status Order ID Comments User Context   02/08/2018 2000 02/12/2018 1755 Full Code [id number]  Modena Jansky, MD Inpatient   02/08/2018 1315 02/08/2018 2000 Full Code [id number]  Kinnie Feil, PA-C ED   08/19/2017 1405 08/21/2017 1513 DNR [id number]  Melanee Spry, MD ED   05/10/2017 1802 05/11/2017 1356 Full Code [id number]  Joanne Gavel, PA-C ED   12/13/2016 2157 12/14/2016 1915 Full Code [id number]  Lovenia Kim, MD Inpatient   11/11/2016 1222 11/12/2016 1456 Full Code [id number]  Bufford Lope, DO Inpatient   06/20/2013 1343 06/22/2013 1444 Full Code [id number]  Janece Canterbury, MD Inpatient      Home/SNF/Other Home  Chief Complaint SI  Level of Care/Admitting Diagnosis ED Disposition    None      Medical History Past Medical History:  Diagnosis Date  . Alcohol abuse, in remission   . Anemia   . Ankylosing spondylitis ([place])   . Anxiety   . Chronic diarrhea   . Chronic pain   . Colitis   . Depression   . Esophagitis   . Gastritis   . GERD (gastroesophageal reflux disease)   . Hypertension   . IBS (irritable bowel syndrome)   . Poor dentition   . SIADH (syndrome of inappropriate ADH production) ([facility])     Allergies Allergies  Allergen Reactions  . Diphenhydramine Hcl Other (See Comments)    Restlessness  . Flexeril [Cyclobenzaprine] Other (See Comments)    Restlessness  . Hctz [Hydrochlorothiazide] Other (See Comments)    Caused to lose sodium when taking with Lisinopril  . Nsaids Other (See Comments)    GI upset  . Hydroxyzine Other (See Comments)    Restlessness    IV Location/Drains/Wounds Patient Lines/Drains/Airways Status   Active Line/Drains/Airways     None          Labs/Imaging Results for orders placed or performed during the hospital encounter of 06/24/18 (from the past 48 hour(s))  Comprehensive metabolic panel     Status: Abnormal   Collection Time: 06/24/18 12:37 PM  Result Value Ref Range   Sodium 130 (L) 135 - 145 mmol/L   Potassium 3.8 3.5 - 5.1 mmol/L   Chloride 93 (L) 98 - 111 mmol/L   CO2 26 22 - 32 mmol/L   Glucose, Bld 92 70 - 99 mg/dL   BUN 5 (L) 8 - 23 mg/dL   Creatinine, Ser 0.68 0.61 - 1.24 mg/dL   Calcium 8.9 8.9 - 10.3 mg/dL   Total Protein 7.1 6.5 - 8.1 g/dL   Albumin 4.2 3.5 - 5.0 g/dL   AST 20 15 - 41 U/L   ALT 19 0 - 44 U/L   Alkaline Phosphatase 77 38 - 126 U/L   Total Bilirubin 0.5 0.3 - 1.2 mg/dL   GFR calc non Af Amer >60 >60 mL/min   GFR calc Af Amer >60 >60 mL/min   Anion gap 11 5 - 15    Comment: Performed at [facility], [place] [address], [place], [place] [address]  Ethanol  Status: None   Collection Time: 06/24/18 12:37 PM  Result Value Ref Range   Alcohol, Ethyl (B) <10 <10 mg/dL    Comment: (NOTE) Lowest detectable limit for serum alcohol is 10 mg/dL. For medical purposes only. Performed at [facility], [place] [address], [place], [place] [address]   Salicylate level     Status: None   Collection Time: 06/24/18 12:37 PM  Result Value Ref Range   Salicylate Lvl <3.7 2.8 - 30.0 mg/dL    Comment: Performed at [facility], [place] [address], [place], [place] [address]  Acetaminophen level     Status: Abnormal   Collection Time: 06/24/18 12:37 PM  Result Value Ref Range   Acetaminophen (Tylenol), Serum <10 (L) 10 - 30 ug/mL    Comment: (NOTE) Therapeutic concentrations vary significantly. A range of 10-30 ug/mL  may be an effective concentration for many patients. However, some  are best treated at concentrations outside of this range. Acetaminophen concentrations >150 ug/mL at 4 hours after ingestion  and >50 ug/mL at 12 hours  after ingestion are often associated with  toxic reactions. Performed at [facility], [place] [address] [place], [place] [address]   cbc     Status: Abnormal   Collection Time: 06/24/18 12:37 PM  Result Value Ref Range   WBC 12.0 (H) 4.0 - 10.5 K/uL   RBC 4.68 4.22 - 5.81 MIL/uL   Hemoglobin 14.1 13.0 - 17.0 g/dL   HCT 42.6 39.0 - 52.0 %   MCV 91.0 80.0 - 100.0 fL   MCH 30.1 26.0 - 34.0 pg   MCHC 33.1 30.0 - 36.0 g/dL   RDW 13.2 11.5 - 15.5 %   Platelets 339 150 - 400 K/uL   nRBC 0.0 0.0 - 0.2 %    Comment: Performed at [facility], [place] [address], [place], [place] [address]  Rapid urine drug screen (hospital performed)     Status: Abnormal   Collection Time: 06/24/18 12:53 PM  Result Value Ref Range   Opiates NONE DETECTED NONE DETECTED   Cocaine NONE DETECTED NONE DETECTED   Benzodiazepines POSITIVE (A) NONE DETECTED   Amphetamines NONE DETECTED NONE DETECTED   Tetrahydrocannabinol NONE DETECTED NONE DETECTED   Barbiturates NONE DETECTED NONE DETECTED    Comment: (NOTE) DRUG SCREEN FOR MEDICAL PURPOSES ONLY.  IF CONFIRMATION IS NEEDED FOR ANY PURPOSE, NOTIFY LAB WITHIN 5 DAYS. LOWEST DETECTABLE LIMITS FOR URINE DRUG SCREEN Drug Class                     Cutoff (ng/mL) Amphetamine and metabolites    1000 Barbiturate and metabolites    200 Benzodiazepine                 676 Tricyclics and metabolites     300 Opiates and metabolites        300 Cocaine and metabolites        300 THC                            50 Performed at [facility], [place] [address] [place], [place] [address]    No results found.  Pending Labs Unresulted Labs (From admission, onward)   None      Vitals/Pain Today's Vitals   06/24/18 1204 06/24/18 1205  BP: (!) 184/96   Pulse: 82   Resp: 16   Temp: 98 F (  36.7 C)   TempSrc: Oral   SpO2: 96%   Weight:  79.3 kg  Height:  5' 11"  (1.803 m)  PainSc:  8     Isolation  Precautions No active isolations  Medications Medications  lisinopril (PRINIVIL,ZESTRIL) tablet 10 mg (has no administration in time range)  pantoprazole (PROTONIX) EC tablet 40 mg (has no administration in time range)  sucralfate (CARAFATE) tablet 1 g (has no administration in time range)  verapamil (CALAN-SR) CR tablet 240 mg (has no administration in time range)  traZODone (DESYREL) tablet 150 mg (has no administration in time range)  LORazepam (ATIVAN) tablet 1 mg (has no administration in time range)  LORazepam (ATIVAN) injection 0-4 mg (has no administration in time range)    Or  LORazepam (ATIVAN) tablet 0-4 mg (has no administration in time range)  LORazepam (ATIVAN) injection 0-4 mg (has no administration in time range)    Or  LORazepam (ATIVAN) tablet 0-4 mg (has no administration in time range)  thiamine (VITAMIN B-1) tablet 100 mg (has no administration in time range)    Or  thiamine (B-1) injection 100 mg (has no administration in time range)    Mobility walks

## 2018-06-24 NOTE — ED Notes (Signed)
Bed: [facility] Expected date:  Expected time:  Means of arrival:  Comments:

## 2018-06-24 NOTE — Progress Notes (Signed)
Patient did not attend wrap-up group.

## 2018-06-24 NOTE — ED Provider Notes (Signed)
[facility] DEPT Provider Note   CSN: [id number] Arrival date & time: 06/24/18  1148     History   Chief Complaint Chief Complaint  Patient presents with  . Suicidal  . Withdrawal    HPI Anthony Lamb is a 63 y.o. male BIB EMS for past medical history of alcohol abuse, anxiety, depression, GERD, IBS who presents for evaluation of suicidal ideation.  EMS, patient was at home with his step-by-step aid and grabbed a kitchen knife and was going to cut his wrist.  The continued was able to get the knife away from him before he was able to cut.  Patient reports that he was grabbing knife with intent to hurt or kill himself.  He states that he has been quite depressed over last few weeks because of all his health issues and he is just tired of it."  He also reports that 2 days ago, he took 8 tablets of lorazepam.  He states that he was doing it because he wanted to sleep not because he was trying to hurt or kill himself.  Patient states that he felt like he was going through withdrawal symptoms and was tired of dealing with the symptoms and he wanted to "get out of it."  His home therapy aide reports that he has been in increasing danger to himself and has made comments about hurting himself.  She states she is worried about his safety.  Patient does report that he drinks alcohol and reports that he drank to 25 ounce beers last night.  He states he has never gone into withdrawal symptoms.  Patient states that he has insomnia which he feels like is contributing to his symptoms.  Patient denies any auditory visualizations, homicidal ideations.  [name]: [id number]   HPI  Past Medical History:  Diagnosis Date  . Alcohol abuse, in remission   . Anemia   . Ankylosing spondylitis ([place])   . Anxiety   . Chronic diarrhea   . Chronic pain   . Colitis   . Depression   . Esophagitis   . Gastritis   . GERD (gastroesophageal reflux disease)   .  Hypertension   . IBS (irritable bowel syndrome)   . Poor dentition   . SIADH (syndrome of inappropriate ADH production) [facility])     Patient Active Problem List   Diagnosis Date Noted  . 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 Alcohol withdrawal ([place]) 02/08/2018  . IBS (irritable bowel syndrome)   . Hypertension   . Colitis   . Adjustment disorder with mixed anxiety and depressed mood 05/11/2017  . Alcohol abuse   . Anxiety   . Gastroesophageal reflux disease   . Osteopenia determined by x-ray 08/06/2014  . Stress fracture of calcaneus 08/06/2014  . Vitamin D deficiency 12/18/2013  . Dementia ([place]) 12/18/2013  . Benign microscopic hematuria 07/06/2013  . SIADH (syndrome of inappropriate ADH production) ([place]) 06/22/2013  . Ankylosing spondylitis ([place]) 06/20/2013  . Malnutrition of moderate degree ([place]) 06/20/2013  . BPH (benign prostatic hyperplasia) 08/22/2010  . History of colonic polyps 08/13/2008  . Essential hypertension 07/03/2007  . PUD (peptic ulcer disease) 07/03/2007    Past Surgical History:  Procedure Laterality Date  . COLONOSCOPY W/ BIOPSIES    . ESOPHAGOGASTRODUODENOSCOPY    . HERNIA REPAIR Bilateral   . OPEN REDUCTION INTERNAL FIXATION (ORIF) DISTAL RADIAL FRACTURE Right 02/11/2018   Procedure: OPEN REDUCTION INTERNAL FIXATION (ORIF) DISTAL RADIAL FRACTURE;  Surgeon: Milly Jakob, MD;  Location: [facility];  Service: Orthopedics;  Laterality: Right;  . TONSILLECTOMY          Home Medications    Prior to Admission medications   Medication Sig Start Date End Date Taking? Authorizing Provider  acetaminophen (TYLENOL) 500 MG tablet Take 1,000 mg by mouth every 6 (six) hours as needed for moderate pain.   Yes [provider]  lisinopril (PRINIVIL,ZESTRIL) 10 MG tablet Take 1 tablet (10 mg total) by mouth daily. Patient taking differently: Take 10 mg by mouth at bedtime.  08/21/17 06/24/18 Yes Lacroce,  Hulen Shouts, MD  ondansetron (ZOFRAN ODT) 8 MG disintegrating tablet Take 1 tablet (8 mg total) by mouth every 8 (eight) hours as needed for nausea. 05/25/18  Yes Nanavati, Ankit, MD  pantoprazole (PROTONIX) 40 MG tablet Take 1 tablet (40 mg total) by mouth daily. 04/24/14  Yes Janith Lima, MD  sodium chloride (OCEAN) 0.65 % SOLN nasal spray Place 2 sprays into both nostrils as needed for congestion.   Yes [provider]  sucralfate (CARAFATE) 1 G tablet Take 1 tablet (1 g total) by mouth 4 (four) times daily -  with meals and at bedtime. Chew before swallowing Patient taking differently: Take 1 g by mouth 2 (two) times daily. Chew before swallowing 12/17/13  Yes Janith Lima, MD  traZODone (DESYREL) 150 MG tablet Take 150 mg by mouth at bedtime. 05/30/18  Yes [provider]  verapamil (CALAN-SR) 240 MG CR tablet Take 240 mg by mouth at bedtime. 02/09/17  Yes [provider]  zolpidem (AMBIEN) 10 MG tablet Take 10 mg by mouth at bedtime.   Yes [provider]    Family History Family History  Problem Relation Age of Onset  . Anxiety disorder Mother   . Congestive Heart Failure Mother   . Crohn's disease Mother   . Colon cancer Mother 42  . Arthritis Father   . High blood pressure Father     Social History Social History   Tobacco Use  . Smoking status: Former Smoker    Types: Cigarettes    Last attempt to quit: 2014    Years since quitting: 6.0  . Smokeless tobacco: Never Used  Substance Use Topics  . Alcohol use: Yes    Comment: daily/ 3-4 beers, has not has any in a few days  . Drug use: No     Allergies   Diphenhydramine hcl; Flexeril [cyclobenzaprine]; Hctz [hydrochlorothiazide]; Nsaids; and Hydroxyzine   Review of Systems Review of Systems  Constitutional: Negative for fever.  Respiratory: Negative for cough and shortness of breath.   Cardiovascular: Negative for chest pain.  Gastrointestinal: Negative for abdominal pain,  nausea and vomiting.  Genitourinary: Negative for dysuria and hematuria.  Neurological: Negative for headaches.  Psychiatric/Behavioral: Positive for self-injury, sleep disturbance and suicidal ideas. Negative for hallucinations.  All other systems reviewed and are negative.    Physical Exam Updated Vital Signs BP (!) 183/97   Pulse 93   Temp 98 F (36.7 C) (Oral)   Resp 16   Ht 5' 11"  (1.803 m)   Wt 79.3 kg   SpO2 96%   BMI 24.38 kg/m   Physical Exam Vitals signs and nursing note reviewed.  Constitutional:      Appearance: Normal appearance. He is well-developed.  HENT:     Head: Normocephalic and atraumatic.  Eyes:     General: Lids are normal.     Conjunctiva/sclera: Conjunctivae normal.  Pupils: Pupils are equal, round, and reactive to light.  Neck:     Musculoskeletal: Full passive range of motion without pain.  Cardiovascular:     Rate and Rhythm: Normal rate and regular rhythm.     Pulses: Normal pulses.     Heart sounds: Normal heart sounds. No murmur. No friction rub. No gallop.   Pulmonary:     Effort: Pulmonary effort is normal.     Breath sounds: Normal breath sounds.  Abdominal:     Palpations: Abdomen is soft. Abdomen is not rigid.     Tenderness: There is generalized abdominal tenderness. There is no guarding.     Comments: Abdomen is soft, nondistended.  Mild generalized tenderness with no focal point.  Musculoskeletal: Normal range of motion.  Skin:    General: Skin is warm and dry.     Capillary Refill: Capillary refill takes less than 2 seconds.  Neurological:     Mental Status: He is alert and oriented to person, place, and time.  Psychiatric:        Mood and Affect: Mood is depressed.        Speech: Speech normal.        Thought Content: Thought content includes suicidal ideation. Thought content does not include homicidal ideation. Thought content does not include homicidal or suicidal plan.      ED Treatments / Results  Labs (all  labs ordered are listed, but only abnormal results are displayed) Labs Reviewed  COMPREHENSIVE METABOLIC PANEL - Abnormal; Notable for the following components:      Result Value   Sodium 130 (*)    Chloride 93 (*)    BUN 5 (*)    All other components within normal limits  ACETAMINOPHEN LEVEL - Abnormal; Notable for the following components:   Acetaminophen (Tylenol), Serum <10 (*)    All other components within normal limits  CBC - Abnormal; Notable for the following components:   WBC 12.0 (*)    All other components within normal limits  RAPID URINE DRUG SCREEN, HOSP PERFORMED - Abnormal; Notable for the following components:   Benzodiazepines POSITIVE (*)    All other components within normal limits  ETHANOL  SALICYLATE LEVEL    EKG EKG Interpretation  Date/Time:  Monday June 24 2018 14:09:46 EST Ventricular Rate:  96 PR Interval:  144 QRS Duration: 96 QT Interval:  372 QTC Calculation: 469 R Axis:   0 Text Interpretation:  Sinus rhythm with Premature atrial complexes with Abberant conduction Otherwise normal ECG Confirmed by Virgel Manifold [phone]) on 06/24/2018 2:57:33 PM   Radiology No results found.  Procedures Procedures (including critical care time)  Medications Ordered in ED Medications  lisinopril (PRINIVIL,ZESTRIL) tablet 10 mg (10 mg Oral Given 06/24/18 1531)  pantoprazole (PROTONIX) EC tablet 40 mg (40 mg Oral Given 06/24/18 1532)  sucralfate (CARAFATE) tablet 1 g (has no administration in time range)  verapamil (CALAN-SR) CR tablet 240 mg (has no administration in time range)  traZODone (DESYREL) tablet 150 mg (has no administration in time range)  LORazepam (ATIVAN) injection 0-4 mg ( Intravenous See Alternative 06/24/18 1540)    Or  LORazepam (ATIVAN) tablet 0-4 mg (0 mg Oral Not Given 06/24/18 1540)  LORazepam (ATIVAN) injection 0-4 mg (has no administration in time range)    Or  LORazepam (ATIVAN) tablet 0-4 mg (has no administration in time range)   thiamine (VITAMIN B-1) tablet 100 mg (100 mg Oral Given 06/24/18 1532)    Or  thiamine (  B-1) injection 100 mg ( Intravenous See Alternative 06/24/18 1532)  LORazepam (ATIVAN) tablet 1 mg (1 mg Oral Given 06/24/18 1533)     Initial Impression / Assessment and Plan / ED Course  I have reviewed the triage vital signs and the nursing notes.  Pertinent labs & imaging results that were available during my care of the patient were reviewed by me and considered in my medical decision making (see chart for details).     63 year old male possible history of depression, alcohol abuse presents for evaluation of suicidal ideation.  Patient reports that 2 days ago, he took 8 lorazepam's help and go to sleep.  Patient today was with his therapy team provider at home and grabbed his knife and was going to cut his wrist to kill himself.  I discussed at length with his at home therapist who states that he has had escalating suicidal ideations.  She is concerned for his safety.  She states that he does not take his medication as directed.  She reports that he has made threatening comments and she is concerned that his behavior will escalate to hurt him or others.  We will plan for medical clearance labs, TTS consultation.  Acetaminophen, salicylate, ethanol level unremarkable.  CBC shows leukocytosis of 12.0.  Otherwise unremarkable.  CMP shows normal bicarb.  BUN/creatinine within normal limits.  UDS shows positive for benzos.  Discussed with Otila Kluver from poison control.  She reports that given the length of time since overdose, reassuring exam and reassuring vitals, patient is medically cleared.  Discussed with behavioral health.  Patient will be accepted for inpatient treatment.  Patient is medically cleared.  Patient is voluntary at this time.  Portions of this note were generated with L[employer]. Dictation errors may occur despite best attempts at proofreading.  Final Clinical Impressions(s) /  ED Diagnoses   Final diagnoses:  Suicidal ideation    ED Discharge Orders    None       Desma Mcgregor 06/24/18 1550    Virgel Manifold, MD 06/25/18 [phone]

## 2018-06-24 NOTE — ED Triage Notes (Signed)
Pt BIB EMS from home. Pt reports taking about 8 tablets of lorazepam on Saturday night, usually takes 3 tablets/day. Pt grabbed knife this morning when aid was there and threatened to cut himself. Aid managed to retrieve knife before any injury. Pt currently going through withdraws because he took the last of his lorazepam.   184/90 hx HTN HR 88 RR 18 96% RA CBG 107

## 2018-06-24 NOTE — Progress Notes (Signed)
1:1 Progress Note D: Pt currently walking hallway with sitter. Patient appropriate to situation. Pt in no current distress.  A: Sitter is currently walking alongside patient. R: Pt remains safe on a 1:1 per MD orders.

## 2018-06-24 NOTE — Tx Team (Signed)
Initial Treatment Plan 06/24/2018 6:39 PM Anthony Lamb UOR:[id number]    PATIENT STRESSORS: Health problems Substance abuse   PATIENT STRENGTHS: Ability for insight Average or above average intelligence Capable of independent living General fund of knowledge   PATIENT IDENTIFIED PROBLEMS: Depression Suicide attempt Alcohol abuse "I made a mistake, I just want to go home"                     DISCHARGE CRITERIA:  Ability to meet basic life and health needs Improved stabilization in mood, thinking, and/or behavior Reduction of life-threatening or endangering symptoms to within safe limits Verbal commitment to aftercare and medication compliance Withdrawal symptoms are absent or subacute and managed without 24-hour nursing intervention  PRELIMINARY DISCHARGE PLAN: Attend aftercare/continuing care group Return to previous living arrangement  PATIENT/FAMILY INVOLVEMENT: This treatment plan has been presented to and reviewed with the patient, Anthony Lamb, and/or family member, .  The patient and family have been given the opportunity to ask questions and make suggestions.  Ashea Winiarski, [place], [place] 06/24/2018, 6:39 PM

## 2018-06-24 NOTE — Progress Notes (Signed)
Nursing Progress Note: 7p-7a D: Pt currently presents with a somatic affect and behavior. Pt states "I cant stop being anxious. I cant sleep. I cant eat. I cant do anything. I can function. I want ativan. I want lunesta. I want a lot of medicine." Interacting minimally with the milieu. Pt reports good sleep during the previous night with current medication regimen. Pt did not attend wrap-up group.  A: Pt provided with medications per providers orders. Pt's labs and vitals were monitored throughout the night. Pt supported emotionally and encouraged to express concerns and questions. Pt educated on medications.  R: Pt's safety ensured with 15 minute and environmental checks. Pt currently denies SI, HI, and AVH. Pt verbally contracts to seek staff if SI,HI, or AVH occurs and to consult with staff before acting on any harmful thoughts. Will continue to monitor.

## 2018-06-24 NOTE — Progress Notes (Signed)
1-1 note: Anthony Lamb was admitted to the unit, unsteady and reports feeling weak and dizzy. He also reports that he falls rather easily and last bad fall was in September and reports that he broke his right wrist. Anthony Lamb was placed on 1-1 at this time for falls risk. R. Safety maintained at this time, will continue to monitor.

## 2018-06-24 NOTE — BH Assessment (Addendum)
Assessment Note  Anthony Lamb is an 63 y.o. male that presents this date with S/I. Patient reports he has a plan to cut himself with a knife or overdose on medications. Patient denies any prior attempts/gestures at self harm. Patient denies any H/I or AVH. Patient is transported by EMS from home. Patient is oriented x 4 and speaks in a low soft voice. Patient reports taking about 8 tablets of lorazepam on Saturday night, usually takes 3 one mg tablets/day. Patient grabbed a knife this morning when his health care worker was present and threatened to cut himself. Aid managed to retrieve knife before any injury. Patent states he took the extra lorazepam in divided doses due to being unable to sleep and being out of Trazodone. Patient is prescribed 150 mg nightly due to insomnia but has been taking more than prescribed since his prescription ran out. Patient states he" is going through withdrawals and can't take it".  He states "he will end it if he has to go through withdrawals." He is concerned about having cancer and is overwhelmed by "everything". Patient states he is not sure if able to get more medication because he has been taking more medication than he is suppose to. Patient denies homicidal ideation. Denies auditory or visual hallucinations. Patient has been drinking alcohol daily for the last three months with reported use to include 2 to 4  twenty once cans per day. Patient lives alone with limited support and no family. Patient receives outpatient services from Talent MD for his medication at [facility]. He is on disability and states he was diagnosed with depression "years ago." Patient reports worsening depression since Christmas with symptoms to include: feeling useless and fatigue. Patient states he is currently going through Benzodiazapine withdrawal because he took the last of his lorazepam. He has a case worker [name] with [facility] [employer]. Patient per chart review was last  seen at [facility] in 2018 when he ran out of medication at that time also. Collateral information [name] called [phone]. She has concerns about patient. She states the patient is depressed and noncompliant with medication. Patient states he is suicidal to her staff. She feels that his ideation escalated when she found him with a knife and is concerned he will kill himself.  She feels he takes his medication wrong to end his life. She feels that being hospitalized would benefit him to stabilize him on medication. She knows he is drinking but is unsure how much. He tried to bring alcohol in car when he was being taken grocery shopping. Case was staffed with Reita Cliche DNP who recommended a inpatient admission to assist with stabilization.     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 Alcohol abuse, in remission   . Anemia   . Ankylosing spondylitis ([place])   . Anxiety   . Chronic diarrhea   . Chronic pain   . Colitis   . Depression   . Esophagitis   . Gastritis   . GERD (gastroesophageal reflux disease)   . Hypertension   . IBS (irritable bowel syndrome)   . Poor dentition   . SIADH (syndrome of inappropriate ADH production) ([place])     Past Surgical History:  Procedure Laterality Date  . COLONOSCOPY W/ BIOPSIES    . ESOPHAGOGASTRODUODENOSCOPY    . HERNIA REPAIR Bilateral   . OPEN REDUCTION  INTERNAL FIXATION (ORIF) DISTAL RADIAL FRACTURE Right 02/11/2018   Procedure: OPEN REDUCTION INTERNAL FIXATION (ORIF) DISTAL RADIAL FRACTURE;  Surgeon: Milly Jakob, MD;  Location: [place];  Service: Orthopedics;  Laterality: Right;  . TONSILLECTOMY      Family History:  Family History  Problem Relation Age of Onset  . Anxiety disorder Mother   . Congestive Heart Failure Mother   . Crohn's disease Mother   . Colon cancer Mother 27  . Arthritis Father    . High blood pressure Father     Social History:  reports that he quit smoking about 6 years ago. His smoking use included cigarettes. He has never used smokeless tobacco. He reports current alcohol use. He reports that he does not use drugs.  Additional Social History:  Alcohol / Drug Use Pain Medications: please see mar Prescriptions: please see mar Over the Counter: please see mar History of alcohol / drug use?: Yes Longest period of sobriety (when/how long): NA Negative Consequences of Use: (Denies) Withdrawal Symptoms: (Denies) Substance #1 Name of Substance 1: Alcohol 1 - Age of First Use: Unknown 1 - Amount (size/oz): 12 oz beers 1 - Frequency: Daily 1 - Duration: Last year 1 - Last Use / Amount: 06/23/18 3 12  oz beers  CIWA: CIWA-Ar BP: (!) 184/96 Pulse Rate: 82 COWS:    Allergies:  Allergies  Allergen Reactions  . Diphenhydramine Hcl Other (See Comments)    Restlessness  . Flexeril [Cyclobenzaprine] Other (See Comments)    Restlessness  . Hctz [Hydrochlorothiazide] Other (See Comments)    Caused to lose sodium when taking with Lisinopril  . Nsaids Other (See Comments)    GI upset  . Hydroxyzine Other (See Comments)    Restlessness    Home Medications: (Not in a hospital admission)   OB/GYN Status:  No LMP for male patient.  General Assessment Data Location of Assessment: [facility] TTS Assessment: In system Is this a Tele or Face-to-Face Assessment?: Face-to-Face Is this an Initial Assessment or a Re-assessment for this encounter?: Initial Assessment Patient Accompanied by:: (NA) Language Other than English: No Living Arrangements: Other (Comment)(Alone) What gender do you identify as?: Male Marital status: Single Living Arrangements: Alone Can pt return to current living arrangement?: Yes Admission Status: Voluntary Is patient capable of signing voluntary admission?: Yes Referral Source: Self/Family/Friend Insurance type: Medicaid     Crisis  Care Plan Living Arrangements: Alone Legal Guardian: (NA) Name of Psychiatrist: Sun MD([facility]) Name of Therapist: None  Education Status Is patient currently in school?: No Is the patient employed, unemployed or receiving disability?: Receiving disability income  Risk to self with the past 6 months Suicidal Ideation: No Has patient been a risk to self within the past 6 months prior to admission? : No Suicidal Intent: No Has patient had any suicidal intent within the past 6 months prior to admission? : No Is patient at risk for suicide?: No, but patient needs Medical Clearance Suicidal Plan?: No Has patient had any suicidal plan within the past 6 months prior to admission? : No Access to Means: No What has been your use of drugs/alcohol within the last 12 months?: NA Previous Attempts/Gestures: No How many times?: 0 Other Self Harm Risks: (Excessive SA use) Triggers for Past Attempts: (NA) Intentional Self Injurious Behavior: None Family Suicide History: No Recent stressful life event(s): Other (Comment)(Out of medications) Persecutory voices/beliefs?: No Depression: Yes Depression Symptoms: Feeling worthless/self pity Substance abuse history and/or treatment for substance abuse?: No Suicide prevention information  given to non-admitted patients: Not applicable  Risk to Others within the past 6 months Homicidal Ideation: No Does patient have any lifetime risk of violence toward others beyond the six months prior to admission? : No Thoughts of Harm to Others: No Current Homicidal Intent: No Current Homicidal Plan: No Access to Homicidal Means: No Identified Victim: NA History of harm to others?: No Assessment of Violence: None Noted Violent Behavior Description: NA Does patient have access to weapons?: No Criminal Charges Pending?: No Does patient have a court date: No Is patient on probation?: No  Psychosis Hallucinations: None noted Delusions: None  noted  Mental Status Report Appearance/Hygiene: Unremarkable Eye Contact: Fair Motor Activity: Freedom of movement Speech: Soft, Slow Level of Consciousness: Quiet/awake Mood: Depressed Affect: Appropriate to circumstance Anxiety Level: Minimal Thought Processes: Coherent, Relevant Judgement: Partial Orientation: Person, Place, Time Obsessive Compulsive Thoughts/Behaviors: None  Cognitive Functioning Concentration: Normal Memory: Recent Intact, Remote Intact Is patient IDD: No Insight: Fair Impulse Control: Fair Appetite: Good Have you had any weight changes? : No Change Sleep: No Change Total Hours of Sleep: 7 Vegetative Symptoms: None  ADLScreening [facility] Assessment Services) Patient's cognitive ability adequate to safely complete daily activities?: Yes Patient able to express need for assistance with ADLs?: Yes Independently performs ADLs?: Yes (appropriate for developmental age)  Prior Inpatient Therapy Prior Inpatient Therapy: No  Prior Outpatient Therapy Prior Outpatient Therapy: Yes Prior Therapy Dates: Ongoing Prior Therapy Facilty/Provider(s): [facility] Reason for Treatment: [facility] Does patient have an ACCT team?: No Does patient have Intensive In-House Services?  : No Does patient have [facility] services? : No Does patient have P4CC services?: No  ADL Screening (condition at time of admission) Patient's cognitive ability adequate to safely complete daily activities?: Yes Is the patient deaf or have difficulty hearing?: No Does the patient have difficulty seeing, even when wearing glasses/contacts?: No Does the patient have difficulty concentrating, remembering, or making decisions?: No Patient able to express need for assistance with ADLs?: Yes Does the patient have difficulty dressing or bathing?: No Independently performs ADLs?: Yes (appropriate for developmental age) Does the patient have difficulty walking or climbing stairs?: No Weakness of  Legs: None Weakness of Arms/Hands: None  Home Assistive Devices/Equipment Home Assistive Devices/Equipment: None  Therapy Consults (therapy consults require a physician order) PT Evaluation Needed: No OT Evalulation Needed: No SLP Evaluation Needed: No Abuse/Neglect Assessment (Assessment to be complete while patient is alone) Physical Abuse: Denies Verbal Abuse: Denies Exploitation of patient/patient's resources: Denies Self-Neglect: Denies Values / Beliefs Cultural Requests During Hospitalization: None Spiritual Requests During Hospitalization: None Consults Spiritual Care Consult Needed: No Social Work Consult Needed: No Regulatory affairs officer (For Healthcare) Does Patient Have a Medical Advance Directive?: No Would patient like information on creating a medical advance directive?: No - Patient declined          Disposition: Case was staffed with Reita Cliche DNP who recommended a inpatient admission to assist with stabilization.    Disposition Initial Assessment Completed for this Encounter: Yes Disposition of Patient: Discharge Patient refused recommended treatment: No Mode of transportation if patient is discharged/movement?: (Unk)  On Site Evaluation by:   Reviewed with Physician:    Mamie Nick 06/24/2018 1:44 PM

## 2018-06-25 DIAGNOSIS — F332 Major depressive disorder, recurrent severe without psychotic features: Principal | ICD-10-CM

## 2018-06-25 DIAGNOSIS — F419 Anxiety disorder, unspecified: Secondary | ICD-10-CM

## 2018-06-25 DIAGNOSIS — G4700 Insomnia, unspecified: Secondary | ICD-10-CM

## 2018-06-25 MED ORDER — LORAZEPAM 1 MG PO TABS: 1.0000 mg | ORAL_TABLET | Freq: Four times a day (QID) | ORAL | Status: DC | PRN

## 2018-06-25 MED ORDER — LORAZEPAM 1 MG PO TABS
1.0000 mg | ORAL_TABLET | Freq: Four times a day (QID) | ORAL | Status: AC | PRN
  Administered 2018-06-26 (×2): 1 mg via ORAL
  Filled 2018-06-25 (×4): qty 1

## 2018-06-25 MED ORDER — LORAZEPAM 1 MG PO TABS
1.0000 mg | ORAL_TABLET | Freq: Four times a day (QID) | ORAL | Status: AC
  Administered 2018-06-25 (×3): 1 mg via ORAL
  Filled 2018-06-25 (×3): qty 1

## 2018-06-25 MED ORDER — HYDROCORTISONE 2.5 % RE CREA
TOPICAL_CREAM | Freq: Two times a day (BID) | RECTAL | Status: DC | PRN
  Administered 2018-06-25: 17:00:00 via RECTAL
  Filled 2018-06-25 (×2): qty 28.35

## 2018-06-25 MED ORDER — THIAMINE HCL 100 MG/ML IJ SOLN: 100.0000 mg | Freq: Once | INTRAMUSCULAR | Status: DC

## 2018-06-25 MED ORDER — LORAZEPAM 1 MG PO TABS
1.0000 mg | ORAL_TABLET | Freq: Three times a day (TID) | ORAL | Status: AC
  Administered 2018-06-26 (×2): 1 mg via ORAL
  Filled 2018-06-25 (×3): qty 1

## 2018-06-25 MED ORDER — ADULT MULTIVITAMIN W/MINERALS CH
1.0000 | ORAL_TABLET | Freq: Every day | ORAL | Status: DC
  Administered 2018-06-25 – 2018-06-29 (×5): 1 via ORAL
  Filled 2018-06-25 (×8): qty 1

## 2018-06-25 MED ORDER — ONDANSETRON 4 MG PO TBDP: 4.0000 mg | ORAL_TABLET | Freq: Four times a day (QID) | ORAL | Status: DC | PRN

## 2018-06-25 MED ORDER — VITAMIN B-1 100 MG PO TABS: 100.0000 mg | ORAL_TABLET | Freq: Every day | ORAL | Status: DC

## 2018-06-25 MED ORDER — LORAZEPAM 1 MG PO TABS
1.0000 mg | ORAL_TABLET | Freq: Two times a day (BID) | ORAL | Status: AC
  Administered 2018-06-27 (×2): 1 mg via ORAL
  Filled 2018-06-25: qty 1

## 2018-06-25 MED ORDER — TRAZODONE HCL 100 MG PO TABS
100.0000 mg | ORAL_TABLET | Freq: Once | ORAL | Status: AC
  Administered 2018-06-25: 100 mg via ORAL
  Filled 2018-06-25 (×2): qty 1

## 2018-06-25 MED ORDER — ENSURE ENLIVE PO LIQD
237.0000 mL | Freq: Two times a day (BID) | ORAL | Status: DC
  Administered 2018-06-25 – 2018-06-29 (×9): 237 mL via ORAL

## 2018-06-25 MED ORDER — ONDANSETRON 4 MG PO TBDP
4.0000 mg | ORAL_TABLET | Freq: Four times a day (QID) | ORAL | Status: AC | PRN
  Administered 2018-06-25: 4 mg via ORAL
  Filled 2018-06-25: qty 1

## 2018-06-25 MED ORDER — LOPERAMIDE HCL 2 MG PO CAPS: 2.0000 mg | ORAL_CAPSULE | ORAL | Status: DC | PRN

## 2018-06-25 MED ORDER — LORAZEPAM 1 MG PO TABS
1.0000 mg | ORAL_TABLET | Freq: Every day | ORAL | Status: AC
  Administered 2018-06-26 – 2018-06-28 (×2): 1 mg via ORAL

## 2018-06-25 MED ORDER — ADULT MULTIVITAMIN W/MINERALS CH: 1.0000 | ORAL_TABLET | Freq: Every day | ORAL | Status: DC

## 2018-06-25 MED ORDER — LOPERAMIDE HCL 2 MG PO CAPS
2.0000 mg | ORAL_CAPSULE | ORAL | Status: AC | PRN
  Filled 2018-06-25: qty 1

## 2018-06-25 MED ORDER — TRAZODONE HCL 50 MG PO TABS
50.0000 mg | ORAL_TABLET | Freq: Every evening | ORAL | Status: DC | PRN
  Administered 2018-06-25: 50 mg via ORAL
  Filled 2018-06-25 (×2): qty 1

## 2018-06-25 MED ORDER — TRAZODONE HCL 50 MG PO TABS: 50.0000 mg | ORAL_TABLET | Freq: Every evening | ORAL | Status: DC | PRN

## 2018-06-25 NOTE — Progress Notes (Signed)
Adult Psychoeducational Group Note  Date:  06/25/2018 Time:  9:54 PM  Group Topic/Focus:  Wrap-Up Group:   The focus of this group is to help patients review their daily goal of treatment and discuss progress on daily workbooks.  Participation Level:  Active  Participation Quality:  Appropriate  Affect:  Appropriate  Cognitive:  Appropriate  Insight: Appropriate  Engagement in Group:  Engaged  Modes of Intervention:  Discussion  Additional Comments:  Patient attended group and participated.   Aune Adami W Zahari Fazzino 09/10/6806, 9:54 PM

## 2018-06-25 NOTE — Progress Notes (Signed)
1:1 note  Pt found in his room resting. Pt has upper gastric pain that he rates at a 5/10. Pt has complaints of diarrhea from the trazodone he thinks. Pt requested Cortizone cream. Pt continues to be anxious. Pt denies any si/hi/ah/vh and verbally agrees to approach staff if these become apparent or before harming himself or others while at [facility]. q77msafety checks implemented and continued. 1:1 continues

## 2018-06-25 NOTE — Progress Notes (Signed)
   D: Patient reports his mood has improved since admission here. States that he is never going to be suicidal again. His main complaints tonight is a fear that he will not sleep. Did make a statement about thinking he has throat cancer. Walking tonight and reports he is unsteady at times due to a back condition (spondylitis). Patient ruminating about sleep issues.  A: Staff will continue to monitor on 1:1 for safety, follow treatment plan, and give medications as ordered. R: Staff to monitor during the night.

## 2018-06-25 NOTE — Progress Notes (Signed)
1:1 note  Pt observed in the hallway with his sitter. Pt has complaints of a headache. Pt is fixated on his ativan protocol. Pt states he is withdrawing from alcohol. Pt states he hasn't slept in days. Pt is redirectable. Pt denies any si/hi/ah/vh and verbally agrees to approach staff if these become apparent or before harming himself or others while at [facility]. Pt has no other complaints at this time. Pt was started on ensure. Pt safe on the unit. q55msafety checks implemented and continued. 1:1 continues.

## 2018-06-25 NOTE — B[facility] Group Notes (Signed)
LCSW Group Therapy Note   06/25/2018  2:30 PM   Type of Therapy and Topic:  Group Therapy- Anger: Consequences and Cues   Participation Level:  Invited, did not attend.     Description of Group:   In this group, patients defined and differentiated anger from other emotions and identified when and how anger becomes a problem. Patient's identified triggering events and their personal anger cues, secondary emotions to anger, and identified healthy coping mechanisms.    Therapeutic Goals: 1. Patients will identify and discuss consequences of anger and outbursts. 2. Patients will identify and discuss triggering events for anger. 3. Patients will explore other emotions that tend to fuel their secondary emotion of anger. 4. Patients will recognize their physical, behavioral, emotional, and cognitive anger cues. 5. Patients will identify coping skills for anger triggering emotions or events.    Summary of Patient Progress:   N/A  Therapeutic Modalities:   Cognitive Behavioral Therapy Motivation Interviewing Solution Focused Therapy

## 2018-06-25 NOTE — Plan of Care (Signed)
Pt progressing in the following metrics  Problem: Education: Goal: Knowledge of [facility] Education information/materials will improve Outcome: Progressing Goal: Mental status will improve Outcome: Progressing Goal: Verbalization of understanding the information provided will improve Outcome: Progressing   Problem: Activity: Goal: Sleeping patterns will improve Outcome: Progressing   Problem: Coping: Goal: Ability to demonstrate self-control will improve Outcome: Progressing

## 2018-06-25 NOTE — H&P (Addendum)
Psychiatric Admission Assessment Adult  Patient Identification: Anthony Lamb MRN:  [id number] Date of Evaluation:  06/25/2018 Chief Complaint: " I felt dehydrated"  Principal Diagnosis: MDD.  Diagnosis:  Active Problems:   Major depressive disorder, recurrent severe without psychotic features ([place])  History of Present Illness: 63 year old male, presented to hospital via EMS. Patient states he called 911 because he felt dehydrated and weak. Patient also reports he impulsively took a knife and threatened to cut self on wrist\, which was witnessed by an aid. Patient states " it was a mistake , I guess I was just feeling frustrated and wanted attention". Patient reports history of being treated with Ativan for several years ( for anxiety), but states that he has recently been taking doses higher than prescribed. He states he took 8 mgrs of Ativan 2-3 days prior to admission, " trying to get some sleep and to relax, not to kill myself or anything like that". Patient also reports increasing anxiety about his physical health over recent weeks, and ruminates about " maybe having cancer", due to weight loss, poor appetite, and " my throat hurting, stinging". Endorses neuro-vegetative symptoms of depression as below. Associated Signs/Symptoms: Depression Symptoms:  depressed mood, anhedonia, insomnia, suicidal thoughts with specific plan, anxiety, loss of energy/fatigue, decreased appetite, reports he has lost about 20 lbs over recent months (Hypo) Manic Symptoms:  None noted  Anxiety Symptoms:  Reports increased anxiety, worry, some panic attacks, and ruminations about his health Psychotic Symptoms:  Denies  PTSD Symptoms: Currently denies PTSD history or symptoms Total Time spent with patient: 45 minutes  Past Psychiatric History: no prior psychiatric admissions, reports he has never attempted suicide or engaged in self injurious behaviors. Denies history of psychosis, denies history of  mania, hypomania. States he has history of depression, which he states is chronic, and a history of anxiety. Denies history of PTSD. Reports he has history of chronic anxiety with excessive worrying and panic attacks.  Denies history of violence  Is the patient at risk to self? Yes.    Has the patient been a risk to self in the past 6 months? No.  Has the patient been a risk to self within the distant past? No.  Is the patient a risk to others? No.  Has the patient been a risk to others in the past 6 months? No.  Has the patient been a risk to others within the distant past? No.   Prior Inpatient Therapy:  denies  Prior Outpatient Therapy:  medications managed by PCP  Alcohol Screening: 1. How often do you have a drink containing alcohol?: 4 or more times a week 2. How many drinks containing alcohol do you have on a typical day when you are drinking?: 5 or 6 3. How often do you have six or more drinks on one occasion?: Weekly AUDIT-C Score: 9 4. How often during the last year have you found that you were not able to stop drinking once you had started?: Less than monthly 5. How often during the last year have you failed to do what was normally expected from you becasue of drinking?: Less than monthly 6. How often during the last year have you needed a first drink in the morning to get yourself going after a heavy drinking session?: Never 7. How often during the last year have you had a feeling of guilt of remorse after drinking?: Never 8. How often during the last year have you been unable to remember what  happened the night before because you had been drinking?: Never 9. Have you or someone else been injured as a result of your drinking?: No 10. Has a relative or friend or a doctor or another health worker been concerned about your drinking or suggested you cut down?: Yes, but not in the last year Alcohol Use Disorder Identification Test Final Score (AUDIT): 13 Intervention/Follow-up:  Alcohol Education Substance Abuse History in the last 12 months: reports he has been drinking , last drank 3 days ago. Reports he normally drinks about 50 ounces of beer per day. Denies drug abuse . Consequences of Substance Abuse: Denies history of blackouts , no history of seizures, denies history of DUIs Previous Psychotropic Medications: reports he has not been on antidepressant medications in the past . States he was prescribed Paxil, but states he did not take it due to concern about hyponatremia associated with antidepressant. States he is prescribed Ativan 1 mgr TID , has been on it for years .  Psychological Evaluations:  No  Past Medical History:  Past Medical History:  Diagnosis Date  . Alcohol abuse, in remission   . Anemia   . Ankylosing spondylitis ([place])   . Anxiety   . Chronic diarrhea   . Chronic pain   . Colitis   . Depression   . Esophagitis   . Gastritis   . GERD (gastroesophageal reflux disease)   . Hypertension   . IBS (irritable bowel syndrome)   . Poor dentition   . SIADH (syndrome of inappropriate ADH production) ([place])     Past Surgical History:  Procedure Laterality Date  . COLONOSCOPY W/ BIOPSIES    . ESOPHAGOGASTRODUODENOSCOPY    . HERNIA REPAIR Bilateral   . OPEN REDUCTION INTERNAL FIXATION (ORIF) DISTAL RADIAL FRACTURE Right 02/11/2018   Procedure: OPEN REDUCTION INTERNAL FIXATION (ORIF) DISTAL RADIAL FRACTURE;  Surgeon: Milly Jakob, MD;  Location: [place];  Service: Orthopedics;  Laterality: Right;  . TONSILLECTOMY     Family History: both parents deceased, mother died from CHF complications, father from unknown causes, no siblings .  Family History  Problem Relation Age of Onset  . Anxiety disorder Mother   . Congestive Heart Failure Mother   . Crohn's disease Mother   . Colon cancer Mother 72  . Arthritis Father   . High blood pressure Father    Family Psychiatric  History: no known mental illness in family , no suicides in family, father  had history of alcohol abuse  Tobacco Screening: Have you used any form of tobacco in the last 30 days? (Cigarettes, Smokeless Tobacco, Cigars, and/or Pipes): No Social History: single, no children, lives alone. On disability, related to back pain , ankylosing spondylitis. Reports he is followed by [employer]) for support. Social History   Substance and Sexual Activity  Alcohol Use Yes   Comment: daily/ 3-4 beers, has not has any in a few days     Social History   Substance and Sexual Activity  Drug Use No    Additional Social History:  Allergies:   Allergies  Allergen Reactions  . Diphenhydramine Hcl Other (See Comments)    Restlessness  . Flexeril [Cyclobenzaprine] Other (See Comments)    Restlessness  . Hctz [Hydrochlorothiazide] Other (See Comments)    Caused to lose sodium when taking with Lisinopril  . Nsaids Other (See Comments)    GI upset  . Hydroxyzine Other (See Comments)    Restlessness   Lab Results:  Results for  orders placed or performed during the hospital encounter of 06/24/18 (from the past 48 hour(s))  Comprehensive metabolic panel     Status: Abnormal   Collection Time: 06/24/18 12:37 PM  Result Value Ref Range   Sodium 130 (L) 135 - 145 mmol/L   Potassium 3.8 3.5 - 5.1 mmol/L   Chloride 93 (L) 98 - 111 mmol/L   CO2 26 22 - 32 mmol/L   Glucose, Bld 92 70 - 99 mg/dL   BUN 5 (L) 8 - 23 mg/dL   Creatinine, Ser 0.68 0.61 - 1.24 mg/dL   Calcium 8.9 8.9 - 10.3 mg/dL   Total Protein 7.1 6.5 - 8.1 g/dL   Albumin 4.2 3.5 - 5.0 g/dL   AST 20 15 - 41 U/L   ALT 19 0 - 44 U/L   Alkaline Phosphatase 77 38 - 126 U/L   Total Bilirubin 0.5 0.3 - 1.2 mg/dL   GFR calc non Af Amer >60 >60 mL/min   GFR calc Af Amer >60 >60 mL/min   Anion gap 11 5 - 15    Comment: Performed at [facility], [place] [address], [place], [place] [address]  Ethanol     Status: None   Collection Time: 06/24/18 12:37 PM  Result Value Ref Range    Alcohol, Ethyl (B) <10 <10 mg/dL    Comment: (NOTE) Lowest detectable limit for serum alcohol is 10 mg/dL. For medical purposes only. Performed at [facility], [place] [address] [place], [place] [address]   Salicylate level     Status: None   Collection Time: 06/24/18 12:37 PM  Result Value Ref Range   Salicylate Lvl <1.0 2.8 - 30.0 mg/dL    Comment: Performed at [facility], [place] [address] [place], [place] [address]  Acetaminophen level     Status: Abnormal   Collection Time: 06/24/18 12:37 PM  Result Value Ref Range   Acetaminophen (Tylenol), Serum <10 (L) 10 - 30 ug/mL    Comment: (NOTE) Therapeutic concentrations vary significantly. A range of 10-30 ug/mL  may be an effective concentration for many patients. However, some  are best treated at concentrations outside of this range. Acetaminophen concentrations >150 ug/mL at 4 hours after ingestion  and >50 ug/mL at 12 hours after ingestion are often associated with  toxic reactions. Performed at [facility], [place] [address] [place], [place] [address]   cbc     Status: Abnormal   Collection Time: 06/24/18 12:37 PM  Result Value Ref Range   WBC 12.0 (H) 4.0 - 10.5 K/uL   RBC 4.68 4.22 - 5.81 MIL/uL   Hemoglobin 14.1 13.0 - 17.0 g/dL   HCT 42.6 39.0 - 52.0 %   MCV 91.0 80.0 - 100.0 fL   MCH 30.1 26.0 - 34.0 pg   MCHC 33.1 30.0 - 36.0 g/dL   RDW 13.2 11.5 - 15.5 %   Platelets 339 150 - 400 K/uL   nRBC 0.0 0.0 - 0.2 %    Comment: Performed at [facility], [place] [address], [place], [place] [address]  Rapid urine drug screen (hospital performed)     Status: Abnormal   Collection Time: 06/24/18 12:53 PM  Result Value Ref Range   Opiates NONE DETECTED NONE DETECTED   Cocaine NONE DETECTED NONE DETECTED   Benzodiazepines POSITIVE (A) NONE DETECTED   Amphetamines NONE DETECTED NONE DETECTED   Tetrahydrocannabinol NONE DETECTED NONE DETECTED    Barbiturates NONE DETECTED NONE  DETECTED    Comment: (NOTE) DRUG SCREEN FOR MEDICAL PURPOSES ONLY.  IF CONFIRMATION IS NEEDED FOR ANY PURPOSE, NOTIFY LAB WITHIN 5 DAYS. LOWEST DETECTABLE LIMITS FOR URINE DRUG SCREEN Drug Class                     Cutoff (ng/mL) Amphetamine and metabolites    1000 Barbiturate and metabolites    200 Benzodiazepine                 017 Tricyclics and metabolites     300 Opiates and metabolites        300 Cocaine and metabolites        300 THC                            50 Performed at [facility], [place] [address] [place], [place] [address]     Blood Alcohol level:  Lab Results  Component Value Date   [facility] <10 06/24/2018   ETH <10 85/27/7824    Metabolic Disorder Labs:  Lab Results  Component Value Date   HGBA1C 5.3 11/12/2016   MPG 105 11/12/2016   No results found for: PROLACTIN Lab Results  Component Value Date   CHOL 177 11/12/2016   TRIG 106 11/12/2016   HDL 48 11/12/2016   CHOLHDL 3.7 11/12/2016   VLDL 21 11/12/2016   LDLCALC 108 (H) 11/12/2016   LDLCALC 126 (H) 12/17/2013    Current Medications: Current Facility-Administered Medications  Medication Dose Route Frequency Provider Last Rate Last Dose  . acetaminophen (TYLENOL) tablet 650 mg  650 mg Oral Q6H PRN Patrecia Pour, NP   650 mg at 06/25/18 0744  . alum & mag hydroxide-simeth (MAALOX/MYLANTA) 200-200-20 MG/5ML suspension 30 mL  30 mL Oral Q4H PRN Patrecia Pour, NP   30 mL at 06/24/18 1809  . feeding supplement (ENSURE ENLIVE) (ENSURE ENLIVE) liquid 237 mL  237 mL Oral BID BM Cobos, Fernando A, MD      . lisinopril (PRINIVIL,ZESTRIL) tablet 10 mg  10 mg Oral Daily Patrecia Pour, NP   10 mg at 06/25/18 0746  . magnesium hydroxide (MILK OF MAGNESIA) suspension 30 mL  30 mL Oral Daily PRN Patrecia Pour, NP      . pantoprazole (PROTONIX) EC tablet 40 mg  40 mg Oral Daily Patrecia Pour, NP   40 mg at 06/25/18 0746  . sucralfate (CARAFATE) tablet 1  g  1 g Oral TID WC & HS Patrecia Pour, NP   1 g at 06/25/18 0746  . traZODone (DESYREL) tablet 150 mg  150 mg Oral QHS,MR X 1 Lindon Romp A, NP   150 mg at 06/24/18 2313  . verapamil (CALAN-SR) CR tablet 240 mg  240 mg Oral QHS Patrecia Pour, NP   240 mg at 06/24/18 2103   PTA Medications: Medications Prior to Admission  Medication Sig Dispense Refill Last Dose  . acetaminophen (TYLENOL) 500 MG tablet Take 1,000 mg by mouth every 6 (six) hours as needed for moderate pain.   06/23/2018 at Unknown time  . lisinopril (PRINIVIL,ZESTRIL) 10 MG tablet Take 1 tablet (10 mg total) by mouth daily. (Patient taking differently: Take 10 mg by mouth at bedtime. ) 30 tablet 0 06/23/2018 at Unknown time  . ondansetron (ZOFRAN ODT) 8 MG disintegrating tablet Take 1 tablet (8 mg total) by mouth every 8 (eight) hours as needed for  nausea. 20 tablet 0 06/23/2018 at Unknown time  . pantoprazole (PROTONIX) 40 MG tablet Take 1 tablet (40 mg total) by mouth daily. 30 tablet 11 06/24/2018 at Unknown time  . sodium chloride (OCEAN) 0.65 % SOLN nasal spray Place 2 sprays into both nostrils as needed for congestion.   06/23/2018 at Unknown time  . sucralfate (CARAFATE) 1 G tablet Take 1 tablet (1 g total) by mouth 4 (four) times daily -  with meals and at bedtime. Chew before swallowing (Patient taking differently: Take 1 g by mouth 2 (two) times daily. Chew before swallowing) 120 tablet 5 06/24/2018 at Unknown time  . traZODone (DESYREL) 150 MG tablet Take 150 mg by mouth at bedtime.   Past Week at Unknown time  . verapamil (CALAN-SR) 240 MG CR tablet Take 240 mg by mouth at bedtime.  2 06/23/2018 at Unknown time  . zolpidem (AMBIEN) 10 MG tablet Take 10 mg by mouth at bedtime.   Past Month at Unknown time    Musculoskeletal: Strength & Muscle Tone: within normal limits Gait & Station: normal gait slow which he attributes to back pain Patient leans: N/A  Psychiatric Specialty Exam: Physical Exam  Review of Systems   Constitutional: Positive for weight loss.  Eyes: Negative.   Respiratory: Negative.   Cardiovascular: Negative.   Gastrointestinal: Positive for heartburn. Negative for blood in stool and nausea.  Genitourinary:       Describes decreased urinary caliber and some vague GU discomfort  Musculoskeletal: Positive for back pain.  Skin: Negative.   Neurological: Positive for headaches. Negative for seizures.  Endo/Heme/Allergies: Negative.   Psychiatric/Behavioral: Positive for depression and suicidal ideas. The patient is nervous/anxious.   All other systems reviewed and are negative.   Blood pressure 103/67, pulse 68, temperature 97.6 F (36.4 C), temperature source Oral, resp. rate 18, height 5' 11"  (1.803 m), weight 79.5 kg.Body mass index is 24.44 kg/m.  General Appearance: Fairly Groomed  Eye Contact:  Fair  Speech:  Normal Rate  Volume:  Normal  Mood:  depressed, anxious  Affect:  congruent, anxious   Thought Process:  Linear and Descriptions of Associations: Intact  Orientation:  Full (Time, Place, and Person)  Thought Content:  denies hallucinations, no delusions expressed, not internally preoccupied   Suicidal Thoughts:  No denies suicidal or homicidal or violent ideations   Homicidal Thoughts:  No denies homicidal ideations  Memory:  recent and remote grossly intact   Judgement:  Fair  Insight:  Fair  Psychomotor Activity:  Normal- mild tremors, no diaphoresis, no psychomotor restlessness   Concentration:  Concentration: Good and Attention Span: Good  Recall:  Good  Fund of Knowledge:  Good  Language:  Good  Akathisia:  Negative  Handed:  Right  AIMS (if indicated):     Assets:  Communication Skills Desire for Improvement Resilience  ADL's:  Intact  Cognition:  WNL  Sleep:  Number of Hours: 5.5    Treatment Plan Summary: Daily contact with patient to assess and evaluate symptoms and progress in treatment, Medication management, Plan inpatient treatment  and  medications as below  Observation Level/Precautions:  1 to 1 for fall precautions  Laboratory: PSA, Stool Guaiac, BMP, TSH  Psychotherapy: milieu, group therapy    Medications:   Ativan detox protocol to minimize risk of alcohol/BZD WDL, as he reports alcohol and long term BZD use prior to admission We discussed possible antidepressant treatment. Would consider Remeron 7.5 mgrs QHS for depression, anxiety, and to help with  sleep and appetite but at this patient expresses concern for potential side effects. Will continue to review.     Consultations: as needed    Discharge Concerns:  -  Estimated LOS: 4-5 days   Other:     Physician Treatment Plan for Primary Diagnosis: MDD Long Term Goal(s): Improvement in symptoms so as ready for discharge  Short Term Goals: Ability to identify changes in lifestyle to reduce recurrence of condition will improve and Ability to maintain clinical measurements within normal limits will improve  Physician Treatment Plan for Secondary Diagnosis: Suicidal Ideations Long Term Goal(s): Improvement in symptoms so as ready for discharge  Short Term Goals: Ability to identify changes in lifestyle to reduce recurrence of condition will improve, Ability to verbalize feelings will improve, Ability to disclose and discuss suicidal ideas, Ability to demonstrate self-control will improve, Ability to identify and develop effective coping behaviors will improve and Ability to maintain clinical measurements within normal limits will improve  I certify that inpatient services furnished can reasonably be expected to improve the patient's condition.    Jenne Campus, MD 1/21/202010:26 AM

## 2018-06-25 NOTE — Progress Notes (Signed)
1:1 Progress Note D: Pt currently sleeping in bed. Patient appropriate to situation. Pt in no current distress.  A: Sitter is currently at bedside. R: Pt remains safe on a 1:1 per MD orders.

## 2018-06-25 NOTE — Progress Notes (Signed)
1:1 note  Pt has been in his room. Pt has no complaints besides needing bigger socks because he feels he has a stress fracture in his L foot. Pt denies any physical symptoms. Cream the pt was requested was obtained and provided. Pt denies any si/hi/ah/vh and verbally agrees to approach staff if these become apparent or before harming himself or others while at [facility]. q48msafety checks implemented and continued. 1:1 continues.

## 2018-06-25 NOTE — [facility] Suicide Risk Assessment (Signed)
[facility] Admission Suicide Risk Assessment   Nursing information obtained from:  Patient Demographic factors:  Male, Caucasian, Low socioeconomic status, Living alone, Unemployed Current Mental Status:  NA Loss Factors:  Decline in physical health Historical Factors:  Family history of mental illness or substance abuse Risk Reduction Factors:  Positive coping skills or problem solving skills  Total Time spent with patient: 45 minutes Principal Problem:  MDD Diagnosis:  Active Problems:   Major depressive disorder, recurrent severe without psychotic features ([place])  Subjective Data:   Continued Clinical Symptoms:  Alcohol Use Disorder Identification Test Final Score (AUDIT): 13 The "Alcohol Use Disorders Identification Test", Guidelines for Use in Primary Care, Second Edition.  World Pharmacologist [facility]). Score between 0-7:  no or low risk or alcohol related problems. Score between 8-15:  moderate risk of alcohol related problems. Score between 16-19:  high risk of alcohol related problems. Score 20 or above:  warrants further diagnostic evaluation for alcohol dependence and treatment.   CLINICAL FACTORS:  63 year old male, lives alone, on disability, presented voluntarily to hospital via EMS.  Reports worsening depression and anxiety.  In particular presents with severe ruminations about his physical health and concerns he may have cancer, which he bases on recent weight loss, oral pharyngeal discomfort/burning, and some vague urinary symptoms such as decreased stream/flow.  On day prior to admission he threatened to cut his wrist, and describes depression as well as neurovegetative symptoms and anxiety.  Denies prior history of suicide attempts, does endorse history of depression. Of note, patient reports drinking about 50 ounces of beer on most days, and being on long-term management with Ativan at 3 mg a day, which he states he sometimes takes at higher doses up to 8 mg daily in an  attempt to sleep  Of note patient's vital signs are currently stable, he does present with mild to distal tremors and slight restlessness.  Gait is slow, unsteady, due to which she is on a one-to-one observation status for fall precautions.     Psychiatric Specialty Exam: Physical Exam  ROS  Blood pressure 103/67, pulse 68, temperature 97.6 F (36.4 C), temperature source Oral, resp. rate 18, height 5' 11"  (1.803 m), weight 79.5 kg.Body mass index is 24.44 kg/m.  See admit note MSE                                                        COGNITIVE FEATURES THAT CONTRIBUTE TO RISK:  Closed-mindedness and Loss of executive function    SUICIDE RISK:   Moderate:  Frequent suicidal ideation with limited intensity, and duration, some specificity in terms of plans, no associated intent, good self-control, limited dysphoria/symptomatology, some risk factors present, and identifiable protective factors, including available and accessible social support.  PLAN OF CARE: Patient will be admitted to inpatient psychiatric unit for stabilization and safety. Will provide and encourage milieu participation. Provide medication management and maked adjustments as needed.  Also provide medication management to minimize risk of withdrawal- will follow daily.    I certify that inpatient services furnished can reasonably be expected to improve the patient's condition.   Jenne Campus, MD 06/25/2018, 11:13 AM

## 2018-06-26 LAB — BASIC METABOLIC PANEL
Anion gap: 11 (ref 5–15)
BUN: 6 mg/dL — ABNORMAL LOW (ref 8–23)
CO2: 26 mmol/L (ref 22–32)
Calcium: 8.8 mg/dL — ABNORMAL LOW (ref 8.9–10.3)
Chloride: 95 mmol/L — ABNORMAL LOW (ref 98–111)
Creatinine, Ser: 0.7 mg/dL (ref 0.61–1.24)
GFR calc Af Amer: >60 mL/min (ref >60)
GFR calc non Af Amer: >60 mL/min (ref >60)
Glucose, Bld: 102 mg/dL — ABNORMAL HIGH (ref 70–99)
POTASSIUM: 3.9 mmol/L (ref 3.5–5.1)
Sodium: 132 mmol/L — ABNORMAL LOW (ref 135–145)

## 2018-06-26 LAB — PSA: Prostatic Specific Antigen: 1.36 ng/mL (ref 0.00–4.00)

## 2018-06-26 LAB — TSH: TSH: 0.921 u[IU]/mL (ref 0.350–4.500)

## 2018-06-26 LAB — FOLATE: Folate: 21.3 ng/mL (ref >5.9)

## 2018-06-26 LAB — VITAMIN B12: Vitamin B-12: 520 pg/mL (ref 180–914)

## 2018-06-26 MED ORDER — QUETIAPINE FUMARATE 25 MG PO TABS
25.0000 mg | ORAL_TABLET | Freq: Every day | ORAL | Status: DC
  Administered 2018-06-26: 25 mg via ORAL
  Filled 2018-06-26 (×4): qty 1

## 2018-06-26 NOTE — Progress Notes (Signed)
[facility] MD Progress Note  06/26/2018 6:01 PM Anthony Lamb  MRN:  [id number] Subjective: Patient reports he is feeling better, less depressed, and currently denies suicidal ideations.  He is future oriented and hoping to be discharged soon.  Today presents less ruminative and focused on concerns about his physical health, but does state that his plan is to follow-up with primary care doctor and specialist for appropriate diagnostic work-up to rule out "throat cancer".  At this time does not endorse significant dysphagia and states he has been eating better since admission.  Objective: I have discussed case with treatment team and have met with patient  63 year old male, lives alone, on disability, presented voluntarily to hospital via EMS.  Reports worsening depression and anxiety.  In particular presents with severe ruminations about his physical health and concerns he may have cancer, which he bases on recent weight loss, oral pharyngeal discomfort/burning, and some vague urinary symptoms such as decreased stream/flow.  On day prior to admission he threatened to cut his wrist, and describes depression as well as neurovegetative symptoms and anxiety.  Denies prior history of suicide attempts, does endorse history of depression. Of note, patient reports drinking about 50 ounces of beer on most days, and being on long-term management with Ativan at 3 mg a day, which he states he sometimes takes at higher doses up to 8 mg daily in an attempt to sleep.  Patient describes feeling better today and does present with a more reactive affect.  Today less ruminative and less focused on somatic concerns.  Describes improving mood.  States sleep remains poor/fair although did sleep "a little better" last night.  We revisited medication management options, discussed Remeron as an option to treat depression, anxiety, insomnia.  Also reviewed Seroquel as a potential option for mood disorder, anxiety, insomnia.  Patient  interested in Seroquel.  We reviewed side effect profile. No disruptive behaviors on unit, polite on approach. He is on a one-to-one observation status due to unsteady gait/fall risk, gait seems improved today. Currently not presenting with significant alcohol withdrawal symptoms-no diaphoresis, no restlessness, BP 155/83, pulse 81. Labs reviewed-patient has a history of hyponatremia-currently sodium 132, which is improved from previous.  BUN 6 creatinine 0.7, GFR > 60, vitamin B12 and folate within normal limits, TSH 0.92 ( WNL), PSA *which was ordered due to patient concerns regarding possible prostate cancer , 1.36 ( WNL)   Principal Problem: Depression, Anxiety , Alcohol Use Disorder Diagnosis: Active Problems:   Major depressive disorder, recurrent severe without psychotic features ([place])  Total Time spent with patient: 20 minutes  Past Psychiatric History:   Past Medical History:  Past Medical History:  Diagnosis Date  . Alcohol abuse, in remission   . Anemia   . Ankylosing spondylitis ([place])   . Anxiety   . Chronic diarrhea   . Chronic pain   . Colitis   . Depression   . Esophagitis   . Gastritis   . GERD (gastroesophageal reflux disease)   . Hypertension   . IBS (irritable bowel syndrome)   . Poor dentition   . SIADH (syndrome of inappropriate ADH production) ([place])     Past Surgical History:  Procedure Laterality Date  . COLONOSCOPY W/ BIOPSIES    . ESOPHAGOGASTRODUODENOSCOPY    . HERNIA REPAIR Bilateral   . OPEN REDUCTION INTERNAL FIXATION (ORIF) DISTAL RADIAL FRACTURE Right 02/11/2018   Procedure: OPEN REDUCTION INTERNAL FIXATION (ORIF) DISTAL RADIAL FRACTURE;  Surgeon: Milly Jakob, MD;  Location: [place];  Service: Orthopedics;  Laterality: Right;  . TONSILLECTOMY     Family History:  Family History  Problem Relation Age of Onset  . Anxiety disorder Mother   . Congestive Heart Failure Mother   . Crohn's disease Mother   . Colon cancer Mother 36  . Arthritis  Father   . High blood pressure Father    Family Psychiatric  History:  Social History:  Social History   Substance and Sexual Activity  Alcohol Use Yes   Comment: daily/ 3-4 beers, has not has any in a few days     Social History   Substance and Sexual Activity  Drug Use No    Social History   Socioeconomic History  . Marital status: Single    Spouse name: Not on file  . Number of children: Not on file  . Years of education: Not on file  . Highest education level: Not on file  Occupational History  . Not on file  Social Needs  . Financial resource strain: Not on file  . Food insecurity:    Worry: Not on file    Inability: Not on file  . Transportation needs:    Medical: Not on file    Non-medical: Not on file  Tobacco Use  . Smoking status: Former Smoker    Types: Cigarettes    Last attempt to quit: 2014    Years since quitting: 6.0  . Smokeless tobacco: Never Used  Substance and Sexual Activity  . Alcohol use: Yes    Comment: daily/ 3-4 beers, has not has any in a few days  . Drug use: No  . Sexual activity: Not Currently  Lifestyle  . Physical activity:    Days per week: Not on file    Minutes per session: Not on file  . Stress: Not on file  Relationships  . Social connections:    Talks on phone: Not on file    Gets together: Not on file    Attends religious service: Not on file    Active member of club or organization: Not on file    Attends meetings of clubs or organizations: Not on file    Relationship status: Not on file  Other Topics Concern  . Not on file  Social History Narrative   HSG. Long - term disability - unable to work. Lived with his mother in her house - she died 01-May-2023 - he is bankrupt/destitute and soon will loose his home. He has not worked enough quarters to be eligible for [employer]. He is advised to seek assistance for health care either with [place].   Additional Social History:   Sleep:  Fair  Appetite:  Improving  Current Medications: Current Facility-Administered Medications  Medication Dose Route Frequency Provider Last Rate Last Dose  . acetaminophen (TYLENOL) tablet 650 mg  650 mg Oral Q6H PRN Patrecia Pour, NP   650 mg at 06/26/18 1752  . alum & mag hydroxide-simeth (MAALOX/MYLANTA) 200-200-20 MG/5ML suspension 30 mL  30 mL Oral Q4H PRN Patrecia Pour, NP   30 mL at 06/24/18 1809  . feeding supplement (ENSURE ENLIVE) (ENSURE ENLIVE) liquid 237 mL  237 mL Oral BID BM Cobos, Fernando A, MD   237 mL at 06/26/18 1459  . hydrocortisone (ANUSOL-HC) 2.5 % rectal cream   Rectal BID PRN Cobos, Myer Peer, MD      . lisinopril (PRINIVIL,ZESTRIL) tablet 10 mg  10 mg Oral Daily Patrecia Pour,  NP   10 mg at 06/26/18 0748  . loperamide (IMODIUM) capsule 2-4 mg  2-4 mg Oral PRN Cobos, Myer Peer, MD      . LORazepam (ATIVAN) tablet 1 mg  1 mg Oral Q6H PRN Cobos, Myer Peer, MD   1 mg at 06/26/18 0146  . LORazepam (ATIVAN) tablet 1 mg  1 mg Oral TID Cobos, Myer Peer, MD   1 mg at 06/26/18 1643   Followed by  . [START ON 06/27/2018] LORazepam (ATIVAN) tablet 1 mg  1 mg Oral BID Cobos, Fernando A, MD      . magnesium hydroxide (MILK OF MAGNESIA) suspension 30 mL  30 mL Oral Daily PRN Patrecia Pour, NP      . multivitamin with minerals tablet 1 tablet  1 tablet Oral Daily Cobos, Myer Peer, MD   1 tablet at 06/26/18 0748  . ondansetron (ZOFRAN-ODT) disintegrating tablet 4 mg  4 mg Oral Q6H PRN Cobos, Myer Peer, MD   4 mg at 06/25/18 1318  . pantoprazole (PROTONIX) EC tablet 40 mg  40 mg Oral Daily Patrecia Pour, NP   40 mg at 06/26/18 0748  . sucralfate (CARAFATE) tablet 1 g  1 g Oral TID WC & HS Patrecia Pour, NP   1 g at 06/26/18 1643  . thiamine (B-1) injection 100 mg  100 mg Intramuscular Once Cobos, Fernando A, MD      . traZODone (DESYREL) tablet 50 mg  50 mg Oral QHS PRN Cobos, Myer Peer, MD   50 mg at 06/25/18 2123  . verapamil (CALAN-SR) CR tablet 240 mg  240 mg Oral  QHS Patrecia Pour, NP   240 mg at 06/25/18 2122    Lab Results:  Results for orders placed or performed during the hospital encounter of 06/24/18 (from the past 48 hour(s))  PSA     Status: None   Collection Time: 06/26/18  6:27 AM  Result Value Ref Range   Prostatic Specific Antigen 1.36 0.00 - 4.00 ng/mL    Comment: (NOTE) While PSA levels of <=4.0 ng/ml are reported as reference range, some men with levels below 4.0 ng/ml can have prostate cancer and many men with PSA above 4.0 ng/ml do not have prostate cancer.  Other tests such as free PSA, age specific reference ranges, PSA velocity and PSA doubling time may be helpful especially in men less than 72 years old. Performed at [facility], [place] [address], [place], [place] [address]   TSH     Status: None   Collection Time: 06/26/18  6:27 AM  Result Value Ref Range   TSH 0.921 0.350 - 4.500 uIU/mL    Comment: Performed by a 3rd Generation assay with a functional sensitivity of <=0.01 uIU/mL. Performed at [facility], [place] [address], [place], [place] [address]   Basic metabolic panel     Status: Abnormal   Collection Time: 06/26/18  6:27 AM  Result Value Ref Range   Sodium 132 (L) 135 - 145 mmol/L   Potassium 3.9 3.5 - 5.1 mmol/L   Chloride 95 (L) 98 - 111 mmol/L   CO2 26 22 - 32 mmol/L   Glucose, Bld 102 (H) 70 - 99 mg/dL   BUN 6 (L) 8 - 23 mg/dL   Creatinine, Ser 0.70 0.61 - 1.24 mg/dL   Calcium 8.8 (L) 8.9 - 10.3 mg/dL   GFR calc non Af Amer >60 >60 mL/min   GFR calc Af Amer >  60 >60 mL/min   Anion gap 11 5 - 15    Comment: Performed at [facility], [place] [address], [place], [place] [address]  Vitamin B12     Status: None   Collection Time: 06/26/18  6:27 AM  Result Value Ref Range   Vitamin B-12 520 180 - 914 pg/mL    Comment: (NOTE) This assay is not validated for testing neonatal or myeloproliferative syndrome specimens for Vitamin B12 levels. Performed  at [facility], [place] [address] [place], [place] [address]   Folate     Status: None   Collection Time: 06/26/18  6:27 AM  Result Value Ref Range   Folate 21.3 >5.9 ng/mL    Comment: Performed at [facility], [place] [address] [place], [place] [address]    Blood Alcohol level:  Lab Results  Component Value Date   [facility] <10 06/24/2018   ETH <10 16/38/4665    Metabolic Disorder Labs: Lab Results  Component Value Date   HGBA1C 5.3 11/12/2016   MPG 105 11/12/2016   No results found for: PROLACTIN Lab Results  Component Value Date   CHOL 177 11/12/2016   TRIG 106 11/12/2016   HDL 48 11/12/2016   CHOLHDL 3.7 11/12/2016   VLDL 21 11/12/2016   LDLCALC 108 (H) 11/12/2016   LDLCALC 126 (H) 12/17/2013    Physical Findings: AIMS: Facial and Oral Movements Muscles of Facial Expression: None, normal Lips and Perioral Area: None, normal Jaw: None, normal Tongue: None, normal,Extremity Movements Upper (arms, wrists, hands, fingers): None, normal Lower (legs, knees, ankles, toes): None, normal, Trunk Movements Neck, shoulders, hips: None, normal, Overall Severity Severity of abnormal movements (highest score from questions above): None, normal Incapacitation due to abnormal movements: None, normal Patient's awareness of abnormal movements (rate only patient's report): No Awareness, Dental Status Current problems with teeth and/or dentures?: Yes Does patient usually wear dentures?: No  CIWA:  CIWA-Ar Total: 1 COWS:     Musculoskeletal: Strength & Muscle Tone: within normal limits Gait & Station: gait improving today Patient leans: N/A  Psychiatric Specialty Exam: Physical Exam  ROS denies chest pain or shortness of breath, today denies dysphagia and states has been able to eat better , no vomiting, no fever or chills   Blood pressure (!) 155/83, pulse 81, temperature 98.6 F (37 C), temperature source Oral, resp. rate 16, height 5' 11"  (1.803 m), weight 79.5 kg.Body mass index is 24.44 kg/m.  General Appearance: improving grooming   Eye Contact:  Good  Speech:  Normal Rate  Volume:  Normal  Mood:  Improving mood, states he feels better today  Affect:  Less anxious  Thought Process:  Linear and Descriptions of Associations: Intact  Orientation:  Other:  Fully alert and attentive  Thought Content:  Denies hallucinations, no delusions, remains ruminative about his health but to a lesser degree than on admission  Suicidal Thoughts:  No denies suicidal or self injurious ideations, denies homicidal or violent ideations   Homicidal Thoughts:  No  Memory:  recent and remote grossly intact   Judgement:  Fair- improving   Insight:  Fair  Psychomotor Activity:  Normal  Concentration:  Concentration: improving  and Attention Span: improving   Recall:  Good  Fund of Knowledge:  Good  Language:  Good  Akathisia:  No  Handed:  Right  AIMS (if indicated):     Assets:  Desire for Improvement Resilience  ADL's:  improving  Cognition:  WNL  Sleep:  Number of Hours: 2.75    Assessment -  63 year old male, lives alone, on disability, presented voluntarily to hospital via EMS.  Reports worsening depression and anxiety.  In particular presents with severe ruminations about his physical health and concerns he may have cancer, which he bases on recent weight loss, oral pharyngeal discomfort/burning, and some vague urinary symptoms such as decreased stream/flow.  On day prior to admission he threatened to cut his wrist, and describes depression as well as neurovegetative symptoms and anxiety.  Denies prior history of suicide attempts, does endorse history of depression. Of note, patient reports drinking about 50 ounces of beer on most days, and being on long-term management with Ativan at 3 mg a day, which he states he sometimes takes at higher doses up to 8 mg daily in an attempt to sleep.   Today patient presents with improving mood,  decreased anxiety, less severe anxious ruminations.  He does continue to describe concern he may have a malignancy, but seems much less focused on this today.  He also states he is feeling better as his appetite has improved since admission. Currently he is not presenting with significant symptoms of withdrawal-vitals stable.  He expresses interest in Seroquel as an option to address anxiety, insomnia, mood symptoms.  Side effects discussed/reviewed.  Treatment Plan Summary: Daily contact with patient to assess and evaluate symptoms and progress in treatment, Medication management, Plan inpatient treatment and medications as below Encourage group and milieu participation to work on coping skills and symptom reduction Continue Ativan detox protocol to minimize risk of withdrawal symptoms Start Seroquel 25 mg nightly for mood disorder/insomnia Continue trazodone 50 mg nightly PRN for insomnia as needed Continue Protonix and Carafate for GERD/gastric protection Continue Verapamil, Lisinopril for HTN Treatment team working on disposition planning. Regarding patient's medical concerns, he states he has a plan of following up with his primary care doctor for appropriate work-up and management as needed. Jenne Campus, MD 06/26/2018, 6:01 PM

## 2018-06-26 NOTE — [facility] Suicide Risk Assessment (Signed)
[place] INPATIENT:  Family/Significant Other Suicide Prevention Education  Suicide Prevention Education:  Contact Attempts: Elsie Amis, close friend [phone]); has been identified by the patient as the family member/significant other with whom the patient will be residing, and identified as the person(s) who will aid the patient in the event of a mental health crisis.  With written consent from the patient, two attempts were made to provide suicide prevention education, prior to and/or following the patient's discharge.  We were unsuccessful in providing suicide prevention education.  A suicide education pamphlet was given to the patient to share with family/significant other.  Date and time of first attempt:06/26/2018 / 2:08pm   Marylee Floras 06/26/2018, 2:08 PM

## 2018-06-26 NOTE — Progress Notes (Signed)
Patient ID: Anthony Lamb, male   DOB: [birth year], 63 y.o.   MRN: [id number]   D: Patient lying in bed with eyes closed. Respirations even and non-labored A: Staff will continue to monitor on 1:1 for safety R: Patient sleeping after being up most of shift. Slept a total of 2.75hr.

## 2018-06-26 NOTE — Progress Notes (Addendum)
  D: Patient remains awake at this time after Trazodone 45m along with a now dose of Trazodone 1057morder given. He is on ativan protocol due to benzo/ETOH use at home and that has sedated either. Patient is hoping to possibly try seroquel or something else for sleep tonight. Patient reports chronic problems with sleep and restless leg syndrome. Various notes from the various hospitals and doctor's office's show reported sleep issues the past 5 years. A: Staff will continue to monitor on 1:1 due to fall history and gait. R: Cooperative with 1:1 staff.

## 2018-06-26 NOTE — Progress Notes (Addendum)
D: Patient pleasant on approach in room tonight. Anxious when speaking about sleep. Asking about medication ordered. Reports that he is going to be discharged tomorrow. Reports no depression or SI. States his problem is anxiety and sleep issues. A: Staff will monitor on 1:1 for safety due to fall risk R: Cooperative with staff. Will continue to monitor.

## 2018-06-26 NOTE — Progress Notes (Signed)
1:1 note  Pt found in his room. No complaints at this time. Pt was wondering if the Dr had switched his trazadone to seroquel. Pt denies any physical pain. Pt wanted saltine crackers. Pt is excited to be leaving tomorrow. Pt denies any si/hi/ah/vh and verbally agrees to approach staff if these become apparent or before harming himself or others while at [facility]. Pt provided support and encouragement. Pt given medication per protocol and standing orders. q40msafety checks implemented and continued. 1:1 continues. Pt safe on the unit.
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
## 2018-06-26 NOTE — Progress Notes (Signed)
1:1 Note: Patient maintained on constant supervision.  Patient ambulating on the unit with walker.  Presents with calm affect and depressed mood.  Routine safety checks maintained every 15 minutes.  Offered support and encouragement as needed.  Patient is safe on the unit.

## 2018-06-26 NOTE — Progress Notes (Signed)
1:1 Note: Patient maintained on constant supervision for safety.  Presents with anxious affect and depressed mood.  Medications given as prescribed.  Reports poor night sleep.  Described energy level as low and concentration as poor.  Denies suicidal thoughts, auditory and visual hallucinations.  Routine safety checks maintained every 15 minutes.  Ambulates on the unit with walker.  Patient is safe on the unit with supervision.

## 2018-06-27 LAB — VITAMIN D 25 HYDROXY (VIT D DEFICIENCY, FRACTURES): Vit D, 25-Hydroxy: 38 ng/mL (ref 30.0–100.0)

## 2018-06-27 MED ORDER — QUETIAPINE FUMARATE 50 MG PO TABS
50.0000 mg | ORAL_TABLET | Freq: Every day | ORAL | Status: DC
  Administered 2018-06-27: 50 mg via ORAL
  Filled 2018-06-27 (×3): qty 1

## 2018-06-27 MED ORDER — HYDROCORTISONE 2.5 % RE CREA: TOPICAL_CREAM | Freq: Two times a day (BID) | RECTAL | 0 refills | Status: DC | PRN

## 2018-06-27 MED ORDER — TRAZODONE HCL 50 MG PO TABS: 50.0000 mg | ORAL_TABLET | Freq: Every evening | ORAL | 0 refills | Status: DC | PRN

## 2018-06-27 MED ORDER — ACETAMINOPHEN 325 MG PO TABS: 650.0000 mg | ORAL_TABLET | Freq: Four times a day (QID) | ORAL | 0 refills | Status: DC | PRN

## 2018-06-27 MED ORDER — GABAPENTIN 100 MG PO CAPS
100.0000 mg | ORAL_CAPSULE | Freq: Three times a day (TID) | ORAL | Status: DC
  Administered 2018-06-27: 100 mg via ORAL
  Filled 2018-06-27 (×7): qty 1

## 2018-06-27 MED ORDER — QUETIAPINE FUMARATE 25 MG PO TABS: 25.0000 mg | ORAL_TABLET | Freq: Every day | ORAL | 0 refills | Status: DC

## 2018-06-27 MED ORDER — ADULT MULTIVITAMIN W/MINERALS CH: 1.0000 | ORAL_TABLET | Freq: Every day | ORAL | 0 refills | Status: DC

## 2018-06-27 MED ORDER — ADULT MULTIVITAMIN W/MINERALS CH: 1.0000 | ORAL_TABLET | Freq: Every day | ORAL | 0 refills | Status: AC

## 2018-06-27 MED ORDER — LORAZEPAM 1 MG PO TABS: 1.0000 mg | ORAL_TABLET | Freq: Four times a day (QID) | ORAL | 0 refills | Status: DC | PRN

## 2018-06-27 MED ORDER — CHLORHEXIDINE GLUCONATE 0.12 % MT SOLN
15.0000 mL | Freq: Three times a day (TID) | OROMUCOSAL | Status: DC | PRN
  Administered 2018-06-28 – 2018-06-29 (×2): 15 mL via OROMUCOSAL
  Filled 2018-06-27 (×3): qty 15

## 2018-06-27 NOTE — Progress Notes (Signed)
  D: Patient up one time since given bedtime medications. Patient sleeping much better than last night. Complained of "the sweats" tonight and was able to change clothes. Able to go back to sleep after he woke up briefly.  A: Staff will continue to monitor on 1:1 for safety, follow treatment plan, and give medications as ordered. R: Staff will monitor and assist as needed.

## 2018-06-27 NOTE — Plan of Care (Signed)
  Problem: Education: Goal: Emotional status will improve Outcome: Progressing   Problem: Activity: Goal: Interest or engagement in activities will improve Outcome: Progressing Goal: Sleeping patterns will improve Outcome: Progressing   Problem: Coping: Goal: Ability to demonstrate self-control will improve Outcome: Progressing   Problem: Health Behavior/Discharge Planning: Goal: Compliance with treatment plan for underlying cause of condition will improve Outcome: Progressing   Problem: Safety: Goal: Periods of time without injury will increase Outcome: Progressing

## 2018-06-27 NOTE — Progress Notes (Signed)
1:1 Note: Patient maintained on constant supervision for safety.  Patient ambulating on the unit with walker.  Presents with calm affect and mood.  Routine safety checks maintained every 15 minutes.  Medications given as prescribed.  Denies suicidal thoughts, auditory and visual hallucinations.  Offered support and encouragement as needed.  Patient is safe on the unit.

## 2018-06-27 NOTE — Progress Notes (Signed)
1:1 Note: Patient maintained on constant supervision for safety.  Presents with anxious affect and mood after a session with [employer].  Patient preoccupied with getting discharge today.  Medications given as prescribed.  Ambulating on the unit without difficulty.  Offered support and encouragement as needed.  Patient is safe on the unit with supervision.

## 2018-06-27 NOTE — Progress Notes (Signed)
1:1 Note: Patient maintained on constant supervision for safety.  Patient ambulating on the unit without difficulty.  Routine safety checks maintained every 15 minutes.  Medications given as prescribed.  Patient safe on the unit with supervision.

## 2018-06-27 NOTE — Progress Notes (Signed)
D: Pt denies SI/HI/AV hallucinations. Pt is pleasant and cooperative. Pt goal for today is to work towards discharge home. A: Pt was offered support and encouragement. Pt was given scheduled medications. Pt was encourage to attend groups. Q 15 minute checks were done for safety.  R:Pt attends groups and interacts well with peers and staff. Pt is taking medication. Pt has no complaints.Pt receptive to treatment and safety maintained on unit.

## 2018-06-27 NOTE — [facility] Group Notes (Signed)
  Type of Therapy: Mental Health Association Presentation  Participation Level: Did Not Attend.    Modes of Intervention: Discussion, Education and Socialization  Summary of Progress/Problems: [place] ([place] Speaker came to talk about his personal journey with mental health. The pt processed ways by which to relate to the speaker. [place] speaker provided handouts and educational information pertaining to groups and services offered by the [facility]. Pt was engaged in speaker's presentation and was receptive to resources provided.   Invited, chose not to attend.     Radonna Ricker, MSW, [place] Worker [facility]  Phone: [phone]

## 2018-06-27 NOTE — Progress Notes (Signed)
Patient ID: Anthony Lamb, male   DOB: [birth year], 63 y.o.   MRN: [id number]   D: Patient lying in bed with eyes closed. Respirations even and non-labored.  A: Staff will continue to monitor on 1:1 for safety R: Cooperative on the unit.

## 2018-06-27 NOTE — Progress Notes (Addendum)
[facility] MD Progress Note  06/27/2018 1:56 PM Anthony Lamb  MRN:  [id number] Subjective: Patient reports feeling "a lot better".  He is hopeful for discharge soon.  He does express some ongoing anxiety, particularly regarding management of anxiety symptoms and of insomnia off benzodiazepines once he is discharged.  He denies suicidal ideations.  Objective: I have discussed case with treatment team and have met with patient  63 year old male, lives alone, on disability, presented voluntarily to hospital via EMS.  Reports worsening depression and anxiety.  In particular presents with severe ruminations about his physical health and concerns he may have cancer, which he bases on recent weight loss, oral pharyngeal discomfort/burning, and some vague urinary symptoms such as decreased stream/flow.  On day prior to admission he threatened to cut his wrist, and describes depression as well as neurovegetative symptoms and anxiety.  Denies prior history of suicide attempts, does endorse history of depression. Of note, patient reports drinking about 50 ounces of beer on most days, and being on long-term management with Ativan at 3 mg a day, which he states he sometimes takes at higher doses up to 8 mg daily in an attempt to sleep.  Patient is presenting with partially improved mood, states he is feeling "a lot better" compared to admission.  He does continue to present with an anxious affect.  With patient's consent CSW Comptroller) and Probation officer met with patient's CST/TCLI care coordinator, who came to visit him on unit. They have been following patient for outpatient support and management, and describes a history of alcohol and benzodiazepine abuse, running out of prescribed benzodiazepines early, leading to increased anxiety and withdrawal symptoms.  They do acknowledge patient seems better than prior to admission but not at baseline.  Patient tends to minimize above, but does acknowledge medication misuse which he  states he does in an attempt to sleep better, describes insomnia as a chronic issue. As above today minimizes depression, states he is feeling "a lot better" denies suicidal ideations.  He does continue to present with anxiety, and although minimizes, appears to be craving for alcohol/BZDs. He is tolerating Ativan detox protocol well thus far.  Today vitals are stable/ blood pressure is 143/80 and heart rate is 80.  No current diaphoresis but does report nighttime diaphoresis and restlessness.  We discussed other medication options to address anxiety, which she describes as severe and protracted, and insomnia.  He did sleep better last night on Seroquel and tolerated this medication well so far.  He also expresses interest in Neurontin for anxiety and pain management (chronic back pain).  States he had taken Neurontin in the past but thinks it may have caused some loose stools. Gait seems more steady. Denies suicidal ideations.  Principal Problem: Depression, Anxiety , Alcohol Use Disorder Diagnosis: Active Problems:   Major depressive disorder, recurrent severe without psychotic features ([place])  Total Time spent with patient: 20 minutes  Past Psychiatric History:   Past Medical History:  Past Medical History:  Diagnosis Date  . Alcohol abuse, in remission   . Anemia   . Ankylosing spondylitis ([place])   . Anxiety   . Chronic diarrhea   . Chronic pain   . Colitis   . Depression   . Esophagitis   . Gastritis   . GERD (gastroesophageal reflux disease)   . Hypertension   . IBS (irritable bowel syndrome)   . Poor dentition   . SIADH (syndrome of inappropriate ADH production) [facility])     Past Surgical  History:  Procedure Laterality Date  . COLONOSCOPY W/ BIOPSIES    . ESOPHAGOGASTRODUODENOSCOPY    . HERNIA REPAIR Bilateral   . OPEN REDUCTION INTERNAL FIXATION (ORIF) DISTAL RADIAL FRACTURE Right 02/11/2018   Procedure: OPEN REDUCTION INTERNAL FIXATION (ORIF) DISTAL RADIAL FRACTURE;   Surgeon: Milly Jakob, MD;  Location: [facility];  Service: Orthopedics;  Laterality: Right;  . TONSILLECTOMY     Family History:  Family History  Problem Relation Age of Onset  . Anxiety disorder Mother   . Congestive Heart Failure Mother   . Crohn's disease Mother   . Colon cancer Mother 32  . Arthritis Father   . High blood pressure Father    Family Psychiatric  History:  Social History:  Social History   Substance and Sexual Activity  Alcohol Use Yes   Comment: daily/ 3-4 beers, has not has any in a few days     Social History   Substance and Sexual Activity  Drug Use No    Social History   Socioeconomic History  . Marital status: Single    Spouse name: Not on file  . Number of children: Not on file  . Years of education: Not on file  . Highest education level: Not on file  Occupational History  . Not on file  Social Needs  . Financial resource strain: Not on file  . Food insecurity:    Worry: Not on file    Inability: Not on file  . Transportation needs:    Medical: Not on file    Non-medical: Not on file  Tobacco Use  . Smoking status: Former Smoker    Types: Cigarettes    Last attempt to quit: 2014    Years since quitting: 6.0  . Smokeless tobacco: Never Used  Substance and Sexual Activity  . Alcohol use: Yes    Comment: daily/ 3-4 beers, has not has any in a few days  . Drug use: No  . Sexual activity: Not Currently  Lifestyle  . Physical activity:    Days per week: Not on file    Minutes per session: Not on file  . Stress: Not on file  Relationships  . Social connections:    Talks on phone: Not on file    Gets together: Not on file    Attends religious service: Not on file    Active member of club or organization: Not on file    Attends meetings of clubs or organizations: Not on file    Relationship status: Not on file  Other Topics Concern  . Not on file  Social History Narrative   HSG. Long - term disability - unable to work. Lived with  his mother in her house - she died 04/28/2023 - he is bankrupt/destitute and soon will loose his home. He has not worked enough quarters to be eligible for [employer]. He is advised to seek assistance for health care either with [place].   Additional Social History:   Sleep: Fair but improving  Appetite:  Improving  Current Medications: Current Facility-Administered Medications  Medication Dose Route Frequency Provider Last Rate Last Dose  . acetaminophen (TYLENOL) tablet 650 mg  650 mg Oral Q6H PRN Patrecia Pour, NP   650 mg at 06/26/18 1752  . alum & mag hydroxide-simeth (MAALOX/MYLANTA) 200-200-20 MG/5ML suspension 30 mL  30 mL Oral Q4H PRN Patrecia Pour, NP   30 mL at 06/24/18 1809  . feeding supplement (ENSURE ENLIVE) (  ENSURE ENLIVE) liquid 237 mL  237 mL Oral BID BM , Myer Peer, MD   237 mL at 06/27/18 0942  . gabapentin (NEURONTIN) capsule 100 mg  100 mg Oral TID , Myer Peer, MD      . hydrocortisone (ANUSOL-HC) 2.5 % rectal cream   Rectal BID PRN , Myer Peer, MD      . lisinopril (PRINIVIL,ZESTRIL) tablet 10 mg  10 mg Oral Daily Patrecia Pour, NP   10 mg at 06/27/18 0806  . loperamide (IMODIUM) capsule 2-4 mg  2-4 mg Oral PRN , Myer Peer, MD      . LORazepam (ATIVAN) tablet 1 mg  1 mg Oral Q6H PRN , Myer Peer, MD   1 mg at 06/26/18 2140  . LORazepam (ATIVAN) tablet 1 mg  1 mg Oral BID , Myer Peer, MD   1 mg at 06/27/18 0806  . magnesium hydroxide (MILK OF MAGNESIA) suspension 30 mL  30 mL Oral Daily PRN Patrecia Pour, NP      . multivitamin with minerals tablet 1 tablet  1 tablet Oral Daily , Myer Peer, MD   1 tablet at 06/27/18 0806  . ondansetron (ZOFRAN-ODT) disintegrating tablet 4 mg  4 mg Oral Q6H PRN , Myer Peer, MD   4 mg at 06/25/18 1318  . pantoprazole (PROTONIX) EC tablet 40 mg  40 mg Oral Daily Patrecia Pour, NP   40 mg at 06/27/18 3893  . QUEtiapine (SEROQUEL) tablet 50 mg  50 mg  Oral QHS ,  A, MD      . sucralfate (CARAFATE) tablet 1 g  1 g Oral TID WC & HS Patrecia Pour, NP   1 g at 06/27/18 0806  . thiamine (B-1) injection 100 mg  100 mg Intramuscular Once ,  A, MD      . traZODone (DESYREL) tablet 50 mg  50 mg Oral QHS PRN , Myer Peer, MD   50 mg at 06/25/18 2123  . verapamil (CALAN-SR) CR tablet 240 mg  240 mg Oral QHS Patrecia Pour, NP   240 mg at 06/26/18 2140    Lab Results:  Results for orders placed or performed during the hospital encounter of 06/24/18 (from the past 48 hour(s))  PSA     Status: None   Collection Time: 06/26/18  6:27 AM  Result Value Ref Range   Prostatic Specific Antigen 1.36 0.00 - 4.00 ng/mL    Comment: (NOTE) While PSA levels of <=4.0 ng/ml are reported as reference range, some men with levels below 4.0 ng/ml can have prostate cancer and many men with PSA above 4.0 ng/ml do not have prostate cancer.  Other tests such as free PSA, age specific reference ranges, PSA velocity and PSA doubling time may be helpful especially in men less than 70 years old. Performed at [facility], [place] [address] [place], [place] [address]   TSH     Status: None   Collection Time: 06/26/18  6:27 AM  Result Value Ref Range   TSH 0.921 0.350 - 4.500 uIU/mL    Comment: Performed by a 3rd Generation assay with a functional sensitivity of <=0.01 uIU/mL. Performed at [facility], [place] [address] [place], [place] [address]   Basic metabolic panel     Status: Abnormal   Collection Time: 06/26/18  6:27 AM  Result Value Ref Range   Sodium 132 (L) 135 - 145 mmol/L   Potassium 3.9 3.5 - 5.1  mmol/L   Chloride 95 (L) 98 - 111 mmol/L   CO2 26 22 - 32 mmol/L   Glucose, Bld 102 (H) 70 - 99 mg/dL   BUN 6 (L) 8 - 23 mg/dL   Creatinine, Ser 0.70 0.61 - 1.24 mg/dL   Calcium 8.8 (L) 8.9 - 10.3 mg/dL   GFR calc non Af Amer >60 >60 mL/min   GFR calc Af Amer >60 >60 mL/min   Anion gap 11 5  - 15    Comment: Performed at [facility], [place] [address] [place], [place] [address]  Vitamin B12     Status: None   Collection Time: 06/26/18  6:27 AM  Result Value Ref Range   Vitamin B-12 520 180 - 914 pg/mL    Comment: (NOTE) This assay is not validated for testing neonatal or myeloproliferative syndrome specimens for Vitamin B12 levels. Performed at [facility], [place] [address], [place], [place] [address]   VITAMIN D 25 Hydroxy (Vit-D Deficiency, Fractures)     Status: None   Collection Time: 06/26/18  6:27 AM  Result Value Ref Range   Vit D, 25-Hydroxy 38.0 30.0 - 100.0 ng/mL    Comment: (NOTE) Vitamin D deficiency has been defined by the [place] practice guideline as a level of serum 25-OH vitamin D less than 20 ng/mL (1,2). The Endocrine Society went on to further define vitamin D insufficiency as a level between 21 and 29 ng/mL (2). 1. IOM (Institute of Medicine). 2010. Dietary reference   intakes for calcium and D. [place]: The   [employer]. 2. Holick MF, Binkley [place], Bischoff-Ferrari HA, et al.   Evaluation, treatment, and prevention of vitamin D   deficiency: an Endocrine Society clinical practice   guideline. JCEM. 2011 Jul; 96(7):1911-30. Performed At: [facility] [place], [place] [id number] Rush Farmer MD QM:[id number]   Folate     Status: None   Collection Time: 06/26/18  6:27 AM  Result Value Ref Range   Folate 21.3 >5.9 ng/mL    Comment: Performed at [facility], [place] [address], [place], [place] [address]    Blood Alcohol level:  Lab Results  Component Value Date   [facility] <10 06/24/2018   ETH <10 24/40/1027    Metabolic Disorder Labs: Lab Results  Component Value Date   HGBA1C 5.3 11/12/2016   MPG 105 11/12/2016   No results found for: PROLACTIN Lab Results  Component Value Date   CHOL 177 11/12/2016    TRIG 106 11/12/2016   HDL 48 11/12/2016   CHOLHDL 3.7 11/12/2016   VLDL 21 11/12/2016   LDLCALC 108 (H) 11/12/2016   LDLCALC 126 (H) 12/17/2013    Physical Findings: AIMS: Facial and Oral Movements Muscles of Facial Expression: None, normal Lips and Perioral Area: None, normal Jaw: None, normal Tongue: None, normal,Extremity Movements Upper (arms, wrists, hands, fingers): None, normal Lower (legs, knees, ankles, toes): None, normal, Trunk Movements Neck, shoulders, hips: None, normal, Overall Severity Severity of abnormal movements (highest score from questions above): None, normal Incapacitation due to abnormal movements: None, normal Patient's awareness of abnormal movements (rate only patient's report): No Awareness, Dental Status Current problems with teeth and/or dentures?: Yes Does patient usually wear dentures?: No  CIWA:  CIWA-Ar Total: 8 COWS:     Musculoskeletal: Strength & Muscle Tone: within normal limits Gait & Station: gait improving today Patient leans: N/A  Psychiatric Specialty Exam: Physical Exam  ROS denies chest pain or shortness of breath, today denies dysphagia and states has been able to eat better , no vomiting, no fever or chills   Blood pressure (!) 143/80, pulse 80, temperature 98.6 F (37 C), temperature source Oral, resp. rate 16, height _0  (1.803 m), weight 79.5 kg.Body mass index is 24.44 kg/m.  General Appearance: Fairly Groomed  Eye Contact:  Good  Speech:  Normal Rate  Volume:  Normal  Mood:  Reports mood is improved, states he feels "a lot better"  Affect:  Remains anxious  Thought Process:  Linear and Descriptions of Associations: Intact  Orientation:  Other:  Fully alert and attentive  Thought Content:  No hallucinations, no delusions, not internally preoccupied.  Less ruminative about his health  Suicidal Thoughts:  No denies suicidal or self injurious ideations, denies homicidal or violent ideations   Homicidal Thoughts:  No   Memory:  recent and remote grossly intact   Judgement:  Fair- improving   Insight:  Fair  Psychomotor Activity:  Normal-no current psychomotor agitation or restlessness.  Mild distal tremors.   Concentration:  Concentration: improving  and Attention Span: improving   Recall:  Good  Fund of Knowledge:  Good  Language:  Good  Akathisia:  No  Handed:  Right  AIMS (if indicated):     Assets:  Desire for Improvement Resilience  ADL's:  improving  Cognition:  WNL  Sleep:  Number of Hours: 5.25    Assessment -  63 year old male, lives alone, on disability, presented voluntarily to hospital via EMS.  Reports worsening depression and anxiety.  In particular presents with severe ruminations about his physical health and concerns he may have cancer, which he bases on recent weight loss, oral pharyngeal discomfort/burning, and some vague urinary symptoms such as decreased stream/flow.  On day prior to admission he threatened to cut his wrist, and describes depression as well as neurovegetative symptoms and anxiety.  Denies prior history of suicide attempts, does endorse history of depression. Of note, patient reports drinking about 50 ounces of beer on most days, and being on long-term management with Ativan at 3 mg a day, which he states he sometimes takes at higher doses up to 8 mg daily in an attempt to sleep.   Patient seen with social worker and Radio broadcast assistant, who corroborate history of alcohol and benzodiazepine abuse.  At this time patient endorses improving mood, minimizes depression, denies suicidal ideations, describes insomnia and anxiety as his major symptoms/issues.  Currently does not present in any acute benzodiazepine withdrawal and vitals are stable.  Tolerating Ativan detox protocol well thus far.  Agrees to Neurontin trial to address anxiety and pain, and will continue Seroquel which was well-tolerated thus far and helped him sleep better last night.  Treatment Plan Summary: Daily  contact with patient to assess and evaluate symptoms and progress in treatment, Medication management, Plan inpatient treatment and medications as below  Meant plan reviewed as below today 1/23 Encourage group and milieu participation to work on coping skills and symptom reduction Continue Ativan detox protocol to minimize risk of withdrawal symptoms Increase Seroquel to 50 mg nightly for mood disorder/insomnia Discontinue Trazodone Start Neurontin 100 mg 3 times a day for anxiety/pain Continue Protonix and Carafate for GERD/gastric protection Continue Verapamil, Lisinopril for HTN Recheck BMP in a.m. to monitor sodium serum level-history of hyponatremia. Treatment team working on disposition planning. Regarding patient's medical concerns, he states he has a plan of following up with his primary care  doctor for appropriate work-up and management as needed. Jenne Campus, MD 06/27/2018, 1:56 PM    Patient ID: Anthony Lamb, male   DOB: [birth year], 63 y.o.   MRN: [id number]

## 2018-06-27 NOTE — [facility] Group Notes (Signed)
Adult Psychoeducational Group Note  Date:  06/27/2018 Time:  10:05 PM  Group Topic/Focus:  Wrap-Up Group:   The focus of this group is to help patients review their daily goal of treatment and discuss progress on daily workbooks.  Participation Level:  Active  Participation Quality:  Appropriate and Attentive  Affect:  Appropriate  Cognitive:  Alert and Appropriate  Insight: Appropriate and Good  Engagement in Group:  Engaged  Modes of Intervention:  Discussion and Education  Additional Comments:  Pt attended and participated in wrap up group this evening. Pt rated their day a 7/10, due to them thinking that they would be discharged today. Pt is ready to go home and they are not having any suicidal thoughts. Pt goal was to work on a discharge plan.     Cristi Loron 06/27/2018, 10:05 PM

## 2018-06-27 NOTE — Progress Notes (Signed)
Patient was seen today by his [place] case worker, Lorine Bears [phone]) and his primary therapist, Lanier Ensign [phone]) from his community support team (CST). CSW and Dr. Parke Poisson, MD were present during this visit.   According to the patient's CST/TCLI Care Coordinator, they do not feel that the patient is ready for discharge for today. The patient reported that he continues to struggle with sleep, however he was ready for discharge. Per his supports, the patient has a history of overmedicating himself due to lack of sleep in addition to drinking alcohol. The patient was not truthful to [place] regarding his alcohol use.   Per Dr. Parke Poisson, after receiving collateral information and observing the patient he does not feel the patient is ready for discharge at this time. Dr. Parke Poisson reports that he is going to explore medications changes to address the patient's sleep issues.   CSW will continue to follow.  Radonna Ricker, MSW, [place] Worker [facility]  Phone: [phone]

## 2018-06-28 ENCOUNTER — Encounter (HOSPITAL_COMMUNITY): Payer: Self-pay | Admitting: Behavioral Health

## 2018-06-28 LAB — BASIC METABOLIC PANEL
Anion gap: 12 (ref 5–15)
BUN: 7 mg/dL — AB (ref 8–23)
CHLORIDE: 95 mmol/L — AB (ref 98–111)
CO2: 25 mmol/L (ref 22–32)
Calcium: 9.2 mg/dL (ref 8.9–10.3)
Creatinine, Ser: 0.62 mg/dL (ref 0.61–1.24)
GFR calc Af Amer: >60 mL/min (ref >60)
GFR calc non Af Amer: >60 mL/min (ref >60)
Glucose, Bld: 107 mg/dL — ABNORMAL HIGH (ref 70–99)
Potassium: 3.6 mmol/L (ref 3.5–5.1)
Sodium: 132 mmol/L — ABNORMAL LOW (ref 135–145)

## 2018-06-28 MED ORDER — LOPERAMIDE HCL 2 MG PO CAPS
2.0000 mg | ORAL_CAPSULE | ORAL | Status: DC | PRN
  Administered 2018-06-28 – 2018-06-29 (×2): 2 mg via ORAL
  Filled 2018-06-28: qty 1

## 2018-06-28 MED ORDER — GABAPENTIN 100 MG PO CAPS
100.0000 mg | ORAL_CAPSULE | Freq: Two times a day (BID) | ORAL | Status: DC
  Administered 2018-06-28 – 2018-06-29 (×2): 100 mg via ORAL
  Filled 2018-06-28: qty 14
  Filled 2018-06-28: qty 1
  Filled 2018-06-28: qty 14
  Filled 2018-06-28 (×2): qty 1
  Filled 2018-06-28: qty 14
  Filled 2018-06-28: qty 1
  Filled 2018-06-28: qty 14

## 2018-06-28 MED ORDER — LORAZEPAM 0.5 MG PO TABS
0.5000 mg | ORAL_TABLET | Freq: Four times a day (QID) | ORAL | Status: DC | PRN
  Administered 2018-06-28 – 2018-06-29 (×3): 0.5 mg via ORAL
  Filled 2018-06-28 (×4): qty 1

## 2018-06-28 MED ORDER — MIRTAZAPINE 7.5 MG PO TABS
7.5000 mg | ORAL_TABLET | Freq: Every day | ORAL | Status: DC
  Administered 2018-06-28: 7.5 mg via ORAL
  Filled 2018-06-28: qty 1
  Filled 2018-06-28: qty 7
  Filled 2018-06-28: qty 1

## 2018-06-28 NOTE — Progress Notes (Addendum)
`   1:1 for 1300 Late entry D Pt remains a high fall risk and on a 1:1 due to his unstable mobility.He has changed his clothes, eaten his lunch, taken prn immodium ( for c/o diarrhea) and been started on prn peridex mouthwash for poor dental caries and gum pain and inflammation.He answered the questions on his daily assessment and on this he denied experiencing SI today. HE rated his depression, hopelessness and anxiety " 5/4/7", respectively.        A HE remains flat, depressed , sad and adamant that he will be discharged home tomorrow.       R Supportive care offered and 1:1 in ploace. Pt has not experienced falls today.

## 2018-06-28 NOTE — Progress Notes (Signed)
Pt requested and received PRN medication for anxiety earlier tonight.  He reports he is worried he will not be able to sleep.   He is currently resting in his bed with eyes closed.  Respirations are even and unlabored.  No distress noted.  Pt appears to be sleeping.  1:1 staff remains with pt for safety.  Will continue to monitor and assess.

## 2018-06-28 NOTE — [facility] Group Notes (Signed)
LCSW Group Therapy Note  06/28/2018 1:15pm  Type of Therapy and Topic:  Group Therapy: Avoiding Self-Sabotaging and Enabling Behaviors  Participation Level:  Did Not Attend   Description of Group:   In this group, patients will learn how to identify obstacles, self-sabotaging and enabling behaviors, as well as: what are they, why do we do them and what needs these behaviors meet. Discuss unhealthy relationships and how to have positive healthy boundaries with those that sabotage and enable. Explore aspects of self-sabotage and enabling in yourself and how to limit these self-destructive behaviors in everyday life.   Lawana Pai, MSW [facility] [place] [facility] 06/28/2018 2:33 PM

## 2018-06-28 NOTE — Progress Notes (Signed)
Nursing 1:1 note D:Pt observed sleeping in bed with eyes closed. Respirations even and unlabored. No distress noted. A: 1:1 observation continues for safety  R: pt remains safe

## 2018-06-28 NOTE — Progress Notes (Addendum)
[facility] MD Progress Note  06/28/2018 9:48 AM NASIF BOS  MRN:  [id number]  Subjective: Patient reporting lingering anxiety.  Minimizes depression.  Denies suicidal ideations.  Hopeful for discharge soon, states "I just do not like being in a closed and situation, I prefer to be home". At this time denies medication side effects.   Objective: Patient seen, case discussed with treatment team. 63 year old male who was admitted to the unit  following worsening depression and anxiety.On day prior to admission he threatened to cut his wrist. He presents with a history of substance abuse with reports of  drinking about 50 ounces of beer on most days, and being on long-term management with Ativan at 3 mg a day, which he states he sometimes takes at higher doses up to 8 mg daily in an attempt to sleep.  Patient reports feeling better than he did prior to admission and is focused on being discharged soon.  Currently minimizes depression.  He does remain anxious, ruminative, but less somatically focused and seems less concerned regarding fears of having a malignancy which she had been focused on on admission.  He does not endorse symptoms of withdrawal , although as above does present anxious , and slightly tremulous .  No diaphoresis, no psychomotor agitation , vitals today are stable: BP 144/80 pulse 69. He is tolerating medications well although reports loose stools/diarrhea from Neurontin.  He does, however, feel that Neurontin may be improving anxiety, and states he is motivated in trying to treat his anxiety with non-benzodiazepine medications. He does not think Seroquel helped for insomnia or for anxiety, sleep remains fair.  We discussed other options, and is expressing interest in Remeron trial.  No disruptive or agitated behaviors on unit, visible in unit hall, limited interaction with peers. Denies suicidal ideations. Labs reviewed- Na+ stable at 132  Principal Problem: Depression, Anxiety ,  Alcohol Use Disorder Diagnosis: Active Problems:   Major depressive disorder, recurrent severe without psychotic features ([place])  Total Time spent with patient: 20 minutes  Past Psychiatric History:   Past Medical History:  Past Medical History:  Diagnosis Date  . Alcohol abuse, in remission   . Anemia   . Ankylosing spondylitis ([place])   . Anxiety   . Chronic diarrhea   . Chronic pain   . Colitis   . Depression   . Esophagitis   . Gastritis   . GERD (gastroesophageal reflux disease)   . Hypertension   . IBS (irritable bowel syndrome)   . Poor dentition   . SIADH (syndrome of inappropriate ADH production) ([place])     Past Surgical History:  Procedure Laterality Date  . COLONOSCOPY W/ BIOPSIES    . ESOPHAGOGASTRODUODENOSCOPY    . HERNIA REPAIR Bilateral   . OPEN REDUCTION INTERNAL FIXATION (ORIF) DISTAL RADIAL FRACTURE Right 02/11/2018   Procedure: OPEN REDUCTION INTERNAL FIXATION (ORIF) DISTAL RADIAL FRACTURE;  Surgeon: Milly Jakob, MD;  Location: [place];  Service: Orthopedics;  Laterality: Right;  . TONSILLECTOMY     Family History:  Family History  Problem Relation Age of Onset  . Anxiety disorder Mother   . Congestive Heart Failure Mother   . Crohn's disease Mother   . Colon cancer Mother 50  . Arthritis Father   . High blood pressure Father    Family Psychiatric  History:  Social History:  Social History   Substance and Sexual Activity  Alcohol Use Yes   Comment: daily/ 3-4 beers, has not has any in a few days  Social History   Substance and Sexual Activity  Drug Use No    Social History   Socioeconomic History  . Marital status: Single    Spouse name: Not on file  . Number of children: Not on file  . Years of education: Not on file  . Highest education level: Not on file  Occupational History  . Not on file  Social Needs  . Financial resource strain: Not on file  . Food insecurity:    Worry: Not on file    Inability: Not on file  .  Transportation needs:    Medical: Not on file    Non-medical: Not on file  Tobacco Use  . Smoking status: Former Smoker    Types: Cigarettes    Last attempt to quit: 2014    Years since quitting: 6.0  . Smokeless tobacco: Never Used  Substance and Sexual Activity  . Alcohol use: Yes    Comment: daily/ 3-4 beers, has not has any in a few days  . Drug use: No  . Sexual activity: Not Currently  Lifestyle  . Physical activity:    Days per week: Not on file    Minutes per session: Not on file  . Stress: Not on file  Relationships  . Social connections:    Talks on phone: Not on file    Gets together: Not on file    Attends religious service: Not on file    Active member of club or organization: Not on file    Attends meetings of clubs or organizations: Not on file    Relationship status: Not on file  Other Topics Concern  . Not on file  Social History Narrative   HSG. Long - term disability - unable to work. Lived with his mother in her house - she died May 18, 2023 - he is bankrupt/destitute and soon will loose his home. He has not worked enough quarters to be eligible for [employer]. He is advised to seek assistance for health care either with [place].   Additional Social History:   Sleep: Fair   Appetite:  Improving  Current Medications: Current Facility-Administered Medications  Medication Dose Route Frequency Provider Last Rate Last Dose  . acetaminophen (TYLENOL) tablet 650 mg  650 mg Oral Q6H PRN Patrecia Pour, NP   650 mg at 06/27/18 1653  . alum & mag hydroxide-simeth (MAALOX/MYLANTA) 200-200-20 MG/5ML suspension 30 mL  30 mL Oral Q4H PRN Patrecia Pour, NP   30 mL at 06/24/18 1809  . chlorhexidine (PERIDEX) 0.12 % solution 15 mL  15 mL Mouth/Throat TID PRN Juana Montini A, MD      . feeding supplement (ENSURE ENLIVE) (ENSURE ENLIVE) liquid 237 mL  237 mL Oral BID BM Glen[place] Wheat, Myer Peer, MD   237 mL at 06/28/18 0947  . gabapentin  (NEURONTIN) capsule 100 mg  100 mg Oral TID Bobbyjoe Pabst, Myer Peer, MD   100 mg at 06/27/18 1405  . hydrocortisone (ANUSOL-HC) 2.5 % rectal cream   Rectal BID PRN Gerald Kuehl, Myer Peer, MD      . lisinopril (PRINIVIL,ZESTRIL) tablet 10 mg  10 mg Oral Daily Patrecia Pour, NP   10 mg at 06/28/18 0830  . loperamide (IMODIUM) capsule 2-4 mg  2-4 mg Oral PRN Colburn Asper, Myer Peer, MD      . LORazepam (ATIVAN) tablet 1 mg  1 mg Oral Q6H PRN Shalaunda Weatherholtz, Myer Peer, MD   1 mg at 06/26/18 2140  .  magnesium hydroxide (MILK OF MAGNESIA) suspension 30 mL  30 mL Oral Daily PRN Patrecia Pour, NP      . multivitamin with minerals tablet 1 tablet  1 tablet Oral Daily Shalon Salado, Myer Peer, MD   1 tablet at 06/28/18 0830  . ondansetron (ZOFRAN-ODT) disintegrating tablet 4 mg  4 mg Oral Q6H PRN La Shehan, Myer Peer, MD   4 mg at 06/25/18 1318  . pantoprazole (PROTONIX) EC tablet 40 mg  40 mg Oral Daily Patrecia Pour, NP   40 mg at 06/28/18 0830  . QUEtiapine (SEROQUEL) tablet 50 mg  50 mg Oral QHS Feliza Diven, Myer Peer, MD   50 mg at 06/27/18 2113  . sucralfate (CARAFATE) tablet 1 g  1 g Oral TID WC & HS Patrecia Pour, NP   1 g at 06/28/18 0830  . thiamine (B-1) injection 100 mg  100 mg Intramuscular Once Kerensa Nicklas, Myer Peer, MD      . verapamil (CALAN-SR) CR tablet 240 mg  240 mg Oral QHS Patrecia Pour, NP   240 mg at 06/27/18 2113    Lab Results:  Results for orders placed or performed during the hospital encounter of 06/24/18 (from the past 48 hour(s))  Basic metabolic panel     Status: Abnormal   Collection Time: 06/28/18  6:57 AM  Result Value Ref Range   Sodium 132 (L) 135 - 145 mmol/L   Potassium 3.6 3.5 - 5.1 mmol/L   Chloride 95 (L) 98 - 111 mmol/L   CO2 25 22 - 32 mmol/L   Glucose, Bld 107 (H) 70 - 99 mg/dL   BUN 7 (L) 8 - 23 mg/dL   Creatinine, Ser 0.62 0.61 - 1.24 mg/dL   Calcium 9.2 8.9 - 10.3 mg/dL   GFR calc non Af Amer >60 >60 mL/min   GFR calc Af Amer >60 >60 mL/min   Anion gap 12 5 - 15    Comment:  Performed at [facility], [place] [address], [place], [place] [address]    Blood Alcohol level:  Lab Results  Component Value Date   [facility] <10 06/24/2018   ETH <10 62/13/0865    Metabolic Disorder Labs: Lab Results  Component Value Date   HGBA1C 5.3 11/12/2016   MPG 105 11/12/2016   No results found for: PROLACTIN Lab Results  Component Value Date   CHOL 177 11/12/2016   TRIG 106 11/12/2016   HDL 48 11/12/2016   CHOLHDL 3.7 11/12/2016   VLDL 21 11/12/2016   LDLCALC 108 (H) 11/12/2016   LDLCALC 126 (H) 12/17/2013    Physical Findings: AIMS: Facial and Oral Movements Muscles of Facial Expression: None, normal Lips and Perioral Area: None, normal Jaw: None, normal Tongue: None, normal,Extremity Movements Upper (arms, wrists, hands, fingers): None, normal Lower (legs, knees, ankles, toes): None, normal, Trunk Movements Neck, shoulders, hips: None, normal, Overall Severity Severity of abnormal movements (highest score from questions above): None, normal Incapacitation due to abnormal movements: None, normal Patient's awareness of abnormal movements (rate only patient's report): No Awareness, Dental Status Current problems with teeth and/or dentures?: Yes Does patient usually wear dentures?: No  CIWA:  CIWA-Ar Total: 2 COWS:     Musculoskeletal: Strength & Muscle Tone: within normal limits Gait & Station: gait improving today Patient leans: N/A  Psychiatric Specialty Exam: Physical Exam  Nursing note and vitals reviewed. Constitutional: He is oriented to person, place, and time.  Neurological: He is alert and oriented to person, place, and  time.    Review of Systems  Psychiatric/Behavioral: Positive for substance abuse. Negative for depression, hallucinations, memory loss and suicidal ideas. The patient is nervous/anxious and has insomnia.   All other systems reviewed and are negative.  denies chest pain or shortness of breath, today denies  dysphagia and states has been able to eat better , no vomiting, reports diarrhea, no fever or chills   Blood pressure (!) 144/80, pulse 69, temperature 98.2 F (36.8 C), temperature source Oral, resp. rate 16, height 5' 11"  (1.803 m), weight 79.5 kg.Body mass index is 24.44 kg/m.  General Appearance: Improving grooming  Eye Contact:  Good  Speech:  Normal Rate  Volume:  Normal  Mood:  Reports she is feeling "a lot better"  Affect:  Remains anxious, although less ruminative  Thought Process:  Linear and Descriptions of Associations: Intact  Orientation:  Other:  Fully alert and attentive  Thought Content:  No hallucinations, no delusions, not internally preoccupied..   Suicidal Thoughts:  No denies suicidal or self injurious ideations, denies homicidal or violent ideations   Homicidal Thoughts:  No  Memory:  recent and remote grossly intact   Judgement:  Fair- improving   Insight:  Fair  Psychomotor Activity:  Normal-no current psychomotor agitation or restlessness.  Mild distal tremors.   Concentration:  Concentration: improving  and Attention Span: improving   Recall:  Good  Fund of Knowledge:  Good  Language:  Good  Akathisia:  No  Handed:  Right  AIMS (if indicated):     Assets:  Desire for Improvement Resilience  ADL's:  improving  Cognition:  WNL  Sleep:  Number of Hours: 3.75    Assessment  63 year old male who was admitted to the unit  following worsening depression and anxiety.On day prior to admission he threatened to cut his wrist. He presents with a history of substance abuse with reports of  drinking about 50 ounces of beer on most days, and being on long-term management with Ativan at 3 mg a day, which he states he sometimes takes at higher doses up to 8 mg daily in an attempt to sleep.  Today patient reports feeling better, minimizes depression, denies suicidal ideations, presents future oriented and focused on discharging soon.  Writer/social worker met with  [facility] staff and patient yesterday.  They described a pattern of intermittent alcohol and benzodiazepine abuse, running out of prescribed benzodiazepines early, leading to increased anxiety, withdrawal symptoms, worsened sleep which in turn perpetuates drinking in an attempt to address symptoms.  We reviewed these concerns with patient, insight is fair, but he does state that he plans to avoid benzodiazepines after discharge.  We have reviewed the negative impact that alcohol can have on level of functioning and risks associated with alcohol abuse, including increased risk of falls, withdrawal symptoms. Sleep remains fair and is a major concern for patient.  He reports Seroquel was not particularly effective, prefers other option.  Currently agrees to Remeron trial, and to continue Neurontin trial for now. Will monitor for side effects. Less withdrawal symptoms today-mildly tremulous, not restless, not diaphoretic, vitals stable.   Treatment Plan Summary:  Reviewed current treatment plan 06/28/2018  Daily contact with patient to assess and evaluate symptoms and progress in treatment, Medication management, Plan inpatient treatment and medications as below   Encourage group and milieu participation to work on coping skills and symptom reduction Continue Ativan detox protocol to minimize risk of withdrawal symptoms D/C Seroquel  Continue  Neurontin 100 twice a  day for anxiety/pain Start Remeron 7.5 mgrs QHS for depression, anxiety, insomnia   Continue Protonix and Carafate for GERD/gastric protection Continue Verapamil, Lisinopril for HTN Treatment team working on disposition planning. Regarding patient's medical concerns, he states he has a plan of following up with his primary care doctor for appropriate work-up and management as needed.  Mordecai Maes, NP 06/28/2018, 9:48 AM   Patient ID: Charolette Forward, male   DOB: Dec 08, 1955, 63 y.o.   MRN: [id number]

## 2018-06-28 NOTE — Progress Notes (Signed)
D Pt is observed standing in front of the 400 hall medication window, with 1:1 MHT at his side. HE wears hospital-issued patient scrubs. He is unshaven, he takes his scheduled meds as ordered. HE is unstable on his feet, his posture is hunched-over and he is leaning on hte med window ledge. HE says " I want to go home".      A HE completed his daily assessment and on this he wrote he denied SI today and he rated his depression, hopelessness and anxeity " 5/4/7", respectively. He is encouraged to go to group and Probation officer will f/u with pt about his dc plans.     R Safety in place and pt remains on  1:1 due to high fall risk.

## 2018-06-28 NOTE — Plan of Care (Signed)
  Problem: Education: Goal: Emotional status will improve Outcome: Progressing

## 2018-06-28 NOTE — Progress Notes (Signed)
Nursing 1:1 note D:Pt observed in room lying on bed with eyes opened. Respirations even and unlabored. No distress noted.  A: 1:1 observation continues for safety  R: pt remains safe

## 2018-06-28 NOTE — Progress Notes (Signed)
Pt is at nurse's station upon initial approach.  His gait is weak and slow.  Pt's movements in general are slow.  He describes his day as "not real good."  He discussed how he thinks he may have "throat cancer."  Pt states "they're supposed to let me go tomorrow."  He reports he feels safe to discharge home tomorrow.  Denies SI/HI, denies hallucinations.  Pt reports generalized aches of 8/10.  He has been visible in milieu this evening.  Introduced self to pt.  Actively listened to pt and offered support and encouragement.  PO intake encouraged, as pt reports poor appetite today.  He reports he drank an ensure and has another one in his room at this time.  PRN medication administered for pain.  Fall prevention techniques reviewed with pt and he verbalized understanding.  1:1 staff remains with pt for safety.  Pt is compliant with pain medication.  He is safe on the unit.  Will continue to monitor and assess per order.

## 2018-06-28 NOTE — Progress Notes (Signed)
Recreation Therapy Notes  Date:  1.24.20 Time: 0930 Location: 300 Hall Dayroom  Group Topic: Stress Management  Goal Area(s) Addresses:  Patient will identify positive stress management techniques. Patient will identify benefits of using stress management post d/c.  Intervention: Stress Management  Activity :  Meditation.  LRT played a meditation that dealt with having choice.  Patients were to listen to the meditation to follow along and engage.  Education:  Stress Management, Discharge Planning.   Education Outcome: Acknowledges Education  Clinical Observations/Feedback:  Pt did not attend group.     Victorino Sparrow, LRT/CTRS         Ria Comment, Tyshawn Keel A 06/28/2018 11:01 AM

## 2018-06-28 NOTE — Progress Notes (Signed)
1:1 D Pt is observed lying in his bed. HE is anxious, somatically preoccupied, he requests to be " sent to the hospital ...ibuprofen don't feel right" and he is taken to speak with Dr. Parke Poisson- who agrees pt is wanting medication ( probably ativan). His BP sitting      1:1  D Pt is observed lying in his bed. HE is resting after being medicated with Ativan 0.5 . He became anxious and restless and spoke with Dr. Parke Poisson, suggesting he be " sent to the [facility].Mar[facility] Kitchenibuprofen just don't feel right".    A His BP was 178/97 HRR 81. SKIn warm and dry. PER Dr. Parke Poisson, he was given a ativan 0.5 mg and helped to get back in the bed- where he is currently resting.    R Safety  Is in place and 1:1 in place.

## 2018-06-29 ENCOUNTER — Encounter (HOSPITAL_COMMUNITY): Payer: Self-pay | Admitting: Behavioral Health

## 2018-06-29 MED ORDER — GABAPENTIN 100 MG PO CAPS: 100.0000 mg | ORAL_CAPSULE | Freq: Two times a day (BID) | ORAL | 0 refills | Status: DC

## 2018-06-29 MED ORDER — QUETIAPINE FUMARATE 25 MG PO TABS
25.0000 mg | ORAL_TABLET | Freq: Every day | ORAL | Status: DC
  Filled 2018-06-29: qty 7

## 2018-06-29 MED ORDER — MIRTAZAPINE 7.5 MG PO TABS: 7.5000 mg | ORAL_TABLET | Freq: Every day | ORAL | 0 refills | Status: DC

## 2018-06-29 MED ORDER — TRAZODONE HCL 50 MG PO TABS
50.0000 mg | ORAL_TABLET | Freq: Every evening | ORAL | Status: DC | PRN
  Filled 2018-06-29: qty 7

## 2018-06-29 MED ORDER — LORAZEPAM 0.5 MG PO TABS
0.5000 mg | ORAL_TABLET | Freq: Once | ORAL | Status: AC
  Administered 2018-06-29: 0.5 mg via ORAL

## 2018-06-29 NOTE — Discharge Summary (Addendum)
Physician Discharge Summary Note  Patient:  Anthony Lamb is an 63 y.o., male MRN:  [id number] DOB:  Jan 14, 1956 Patient phone:  [phone] (home)  Patient address:   [place] [address],  Total Time spent with patient: 30 minutes  Date of Admission:  06/24/2018 Date of Discharge: 06/24/2018  Reason for Admission:  63 year old male, presented to hospital via EMS. Patient states he called 911 because he felt dehydrated and weak. Patient also reports he impulsively took a knife and threatened to cut self on wrist\, which was witnessed by an aid. Patient states " it was a mistake , I guess I was just feeling frustrated and wanted attention". Patient reports history of being treated with Ativan for several years ( for anxiety), but states that he has recently been taking doses higher than prescribed. He states he took 8 mgrs of Ativan 2-3 days prior to admission, " trying to get some sleep and to relax, not to kill myself or anything like that". Patient also reports increasing anxiety about his physical health over recent weeks, and ruminates about " maybe having cancer", due to weight loss, poor appetite, and " my throat hurting, stinging". .  Principal Problem: <principal problem not specified> Discharge Diagnoses: Active Problems:   Major depressive disorder, recurrent severe without psychotic features [facility])   Past Psychiatric History: no prior psychiatric admissions, reports he has never attempted suicide or engaged in self injurious behaviors. Denies history of psychosis, denies history of mania, hypomania. States he has history of depression, which he states is chronic, and a history of anxiety. Denies history of PTSD. Reports he has history of chronic anxiety with excessive worrying and panic attacks.  Denies history of violence  Past Medical History:  Past Medical History:  Diagnosis Date  . Alcohol abuse, in remission   . Anemia   . Ankylosing  spondylitis ([place])   . Anxiety   . Chronic diarrhea   . Chronic pain   . Colitis   . Depression   . Esophagitis   . Gastritis   . GERD (gastroesophageal reflux disease)   . Hypertension   . IBS (irritable bowel syndrome)   . Poor dentition   . SIADH (syndrome of inappropriate ADH production) ([place])     Past Surgical History:  Procedure Laterality Date  . COLONOSCOPY W/ BIOPSIES    . ESOPHAGOGASTRODUODENOSCOPY    . HERNIA REPAIR Bilateral   . OPEN REDUCTION INTERNAL FIXATION (ORIF) DISTAL RADIAL FRACTURE Right 02/11/2018   Procedure: OPEN REDUCTION INTERNAL FIXATION (ORIF) DISTAL RADIAL FRACTURE;  Surgeon: Milly Jakob, MD;  Location: [place];  Service: Orthopedics;  Laterality: Right;  . TONSILLECTOMY     Family History:  Family History  Problem Relation Age of Onset  . Anxiety disorder Mother   . Congestive Heart Failure Mother   . Crohn's disease Mother   . Colon cancer Mother 79  . Arthritis Father   . High blood pressure Father    Family Psychiatric  History: both parents deceased, mother died from CHF complications, father from unknown causes, no siblings .  Social History:  Social History   Substance and Sexual Activity  Alcohol Use Yes   Comment: daily/ 3-4 beers, has not has any in a few days     Social History   Substance and Sexual Activity  Drug Use No    Social History   Socioeconomic History  . Marital status: Single    Spouse name: Not on  file  . Number of children: Not on file  . Years of education: Not on file  . Highest education level: Not on file  Occupational History  . Not on file  Social Needs  . Financial resource strain: Not on file  . Food insecurity:    Worry: Not on file    Inability: Not on file  . Transportation needs:    Medical: Not on file    Non-medical: Not on file  Tobacco Use  . Smoking status: Former Smoker    Types: Cigarettes    Last attempt to quit: 2014    Years since quitting: 6.0  . Smokeless tobacco: Never  Used  Substance and Sexual Activity  . Alcohol use: Yes    Comment: daily/ 3-4 beers, has not has any in a few days  . Drug use: No  . Sexual activity: Not Currently  Lifestyle  . Physical activity:    Days per week: Not on file    Minutes per session: Not on file  . Stress: Not on file  Relationships  . Social connections:    Talks on phone: Not on file    Gets together: Not on file    Attends religious service: Not on file    Active member of club or organization: Not on file    Attends meetings of clubs or organizations: Not on file    Relationship status: Not on file  Other Topics Concern  . Not on file  Social History Narrative   HSG. Long - term disability - unable to work. Lived with his mother in her house - she died 05/05/23 - he is bankrupt/destitute and soon will loose his home. He has not worked enough quarters to be eligible for [employer]. He is advised to seek assistance for health care either with [facility].    Hospital Course:  In brief: 63 year old male who was admitted to the unit  following worsening depression and anxiety.On day prior to admission he threatened to cut his wrist. He presents with a history of substance abuse with reports of  drinking about 50 ounces of beer on most days,and being on long-term management with Ativan at 3 mg a day,which he states he sometimes takes at higher doses up to 8 mg daily in an attempt to sleep.  After the above admission assessment, patients presenting symptoms were identified. His labs were reviewed and BMP showed decreased sodium 132. PSA and TSH were normal. UDS positive for benzodiazepines. Ethanol was <10. Other labs as noted as below.  He was medicated & discharged on;  Neurontin 100 twice a day for anxiety/pain  Remeron 7.5 mgrs QHS for depression, anxiety, insomnia    Protonix and Carafate for GERD/gastric protection  Verapamil, Lisinopril for HTN He was advised to resume home  medications as noted below.   He tolerated his treatment regimen without any adverse effects reported. During his hospital course, patient was enrolled & actively  participated in the group counseling sessions. He was able to verbalize coping skills that should help him cope better to maintain depression/mood stability upon returning home.  During the course of his hospitalization, patient reported feeling better, although minimized depression. He denied suicidal ideations, presented future oriented and focused on discharging soon.  Writer/social worker met with CST/[facility] staff while patient was on the unit.  They described a pattern of intermittent alcohol and benzodiazepine abuse, running out of prescribed benzodiazepines early, leading to increased anxiety, withdrawal symptoms,  worsened sleep which in turn perpetuates drinking in an attempt to address symptoms.  We reviewed these concerns with patient, insight is fair, but he did state that he plans to avoid benzodiazepines after discharge. Reviewed the negative impact that alcohol can have on level of functioning and risks associated with alcohol abuse, including increased risk of falls, withdrawal symptoms.  Upon discharge,he denied any SIHI, AVH, delusional thoughts or paranoia. He was evaluated by the  MD who  agreed that Prathik was both mentally & medically stable to be discharged to continue mental health care on an outpatient basis as noted below. He was provided with all the necessary information needed to make this appointment without problems. He was provided with a  prescription for his [facility] discharge medications to resume following discharge. He left [facility] with all personal belongings in no apparent distress. Transportation per his arrangement.  Physical Findings: AIMS: Facial and Oral Movements Muscles of Facial Expression: None, normal Lips and Perioral Area: None, normal Jaw: None, normal Tongue: None, normal,Extremity Movements Upper  (arms, wrists, hands, fingers): None, normal Lower (legs, knees, ankles, toes): None, normal, Trunk Movements Neck, shoulders, hips: None, normal, Overall Severity Severity of abnormal movements (highest score from questions above): None, normal Incapacitation due to abnormal movements: None, normal Patient's awareness of abnormal movements (rate only patient's report): No Awareness, Dental Status Current problems with teeth and/or dentures?: Yes Does patient usually wear dentures?: No  CIWA:  CIWA-Ar Total: 7 COWS:     Musculoskeletal: Strength & Muscle Tone: within normal limits Gait & Station: normal Patient leans: N/A  Psychiatric Specialty Exam: SEE SRA BY MD  Physical Exam  Nursing note and vitals reviewed. Constitutional: He is oriented to person, place, and time. He appears well-developed.  Neurological: He is alert and oriented to person, place, and time.  Psychiatric: He has a normal mood and affect. His behavior is normal.    Review of Systems  Psychiatric/Behavioral: Negative for hallucinations, memory loss, substance abuse and suicidal ideas. Depression: improved. Nervous/anxious: continued to endorse anxiety with slight improvement only. Insomnia: improved.   All other systems reviewed and are negative.   Blood pressure (!) 178/97, pulse 81, temperature 98.2 F (36.8 C), temperature source Oral, resp. rate 16, height _0  (1.803 m), weight 79.5 kg.Body mass index is 24.44 kg/m.    Have you used any form of tobacco in the last 30 days? (Cigarettes, Smokeless Tobacco, Cigars, and/or Pipes): No  Has this patient used any form of tobacco in the last 30 days? (Cigarettes, Smokeless Tobacco, Cigars, and/or Pipes)  No  Recent Results (from the past 2160 hour(s))  I-stat Chem 8, ED     Status: Abnormal   Collection Time: 04/07/18  9:03 AM  Result Value Ref Range   Sodium 135 135 - 145 mmol/L   Potassium 4.5 3.5 - 5.1 mmol/L   Chloride 96 (L) 98 - 111 mmol/L   BUN 4  (L) 8 - 23 mg/dL   Creatinine, Ser 0.80 0.61 - 1.24 mg/dL   Glucose, Bld 92 70 - 99 mg/dL   Calcium, Ion 1.11 (L) 1.15 - 1.40 mmol/L   TCO2 31 22 - 32 mmol/L   Hemoglobin 15.0 13.0 - 17.0 g/dL   HCT 44.0 39.0 - 52.0 %  CBC with Differential/Platelet     Status: None   Collection Time: 04/17/18  8:13 AM  Result Value Ref Range   WBC 8.1 4.0 - 10.5 K/uL   RBC 4.39 4.22 - 5.81 MIL/uL  Hemoglobin 13.6 13.0 - 17.0 g/dL   HCT 40.6 39.0 - 52.0 %   MCV 92.5 80.0 - 100.0 fL   MCH 31.0 26.0 - 34.0 pg   MCHC 33.5 30.0 - 36.0 g/dL   RDW 12.6 11.5 - 15.5 %   Platelets 334 150 - 400 K/uL   nRBC 0.0 0.0 - 0.2 %   Neutrophils Relative % 72 %   Neutro Abs 5.9 1.7 - 7.7 K/uL   Lymphocytes Relative 16 %   Lymphs Abs 1.3 0.7 - 4.0 K/uL   Monocytes Relative 10 %   Monocytes Absolute 0.8 0.1 - 1.0 K/uL   Eosinophils Relative 1 %   Eosinophils Absolute 0.0 0.0 - 0.5 K/uL   Basophils Relative 1 %   Basophils Absolute 0.1 0.0 - 0.1 K/uL   Immature Granulocytes 0 %   Abs Immature Granulocytes 0.03 0.00 - 0.07 K/uL    Comment: Performed at [facility], [place] [address] [place], [place] [address]  Comprehensive metabolic panel     Status: Abnormal   Collection Time: 04/17/18  8:13 AM  Result Value Ref Range   Sodium 130 (L) 135 - 145 mmol/L   Potassium 4.0 3.5 - 5.1 mmol/L   Chloride 95 (L) 98 - 111 mmol/L   CO2 27 22 - 32 mmol/L   Glucose, Bld 100 (H) 70 - 99 mg/dL   BUN 6 (L) 8 - 23 mg/dL   Creatinine, Ser 0.72 0.61 - 1.24 mg/dL   Calcium 8.6 (L) 8.9 - 10.3 mg/dL   Total Protein 7.2 6.5 - 8.1 g/dL   Albumin 3.8 3.5 - 5.0 g/dL   AST 23 15 - 41 U/L   ALT 17 0 - 44 U/L   Alkaline Phosphatase 82 38 - 126 U/L   Total Bilirubin 0.7 0.3 - 1.2 mg/dL   GFR calc non Af Amer >60 >60 mL/min   GFR calc Af Amer >60 >60 mL/min    Comment: (NOTE) The eGFR has been calculated using the CKD EPI equation. This calculation has not been validated in all clinical situations. eGFR's  persistently <60 mL/min signify possible Chronic Kidney Disease.    Anion gap 8 5 - 15    Comment: Performed at [facility], [place] [address] [place], [place] [address]  Lipase, blood     Status: None   Collection Time: 04/17/18  8:13 AM  Result Value Ref Range   Lipase 29 11 - 51 U/L    Comment: Performed at [facility], [place] [address], [place], [place] [address]  Urinalysis, Routine w reflex microscopic     Status: Abnormal   Collection Time: 04/17/18  8:44 AM  Result Value Ref Range   Color, Urine YELLOW YELLOW   APPearance CLEAR CLEAR   Specific Gravity, Urine 1.005 1.005 - 1.030   pH 7.0 5.0 - 8.0   Glucose, UA NEGATIVE NEGATIVE mg/dL   Hgb urine dipstick SMALL (A) NEGATIVE   Bilirubin Urine NEGATIVE NEGATIVE   Ketones, ur NEGATIVE NEGATIVE mg/dL   Protein, ur NEGATIVE NEGATIVE mg/dL   Nitrite NEGATIVE NEGATIVE   Leukocytes, UA NEGATIVE NEGATIVE   RBC / HPF 6-10 0 - 5 RBC/hpf   WBC, UA 0-5 0 - 5 WBC/hpf   Bacteria, UA NONE SEEN NONE SEEN   Mucus PRESENT     Comment: Performed at [facility], [place] [address] [place], [place] [address]  Urine culture     Status: None  Collection Time: 04/17/18  8:44 AM  Result Value Ref Range   Specimen Description      URINE, CLEAN CATCH Performed at [facility], [place] [address], [place], [place] [address]    Special Requests      Normal Performed at [facility], [place] [address], [place], [place] [address]    Culture      NO GROWTH Performed at [facility], [place] [address] [place], [place] [address]    Report Status 04/18/2018 FINAL   Comprehensive metabolic panel     Status: Abnormal   Collection Time: 05/15/18  8:11 AM  Result Value Ref Range   Sodium 134 (L) 135 - 145 mmol/L   Potassium 3.8 3.5 - 5.1 mmol/L   Chloride 99 98 - 111 mmol/L   CO2 26 22 - 32 mmol/L   Glucose, Bld 125 (H) 70 - 99 mg/dL   BUN 5 (L) 8 - 23 mg/dL    Creatinine, Ser 0.70 0.61 - 1.24 mg/dL   Calcium 8.8 (L) 8.9 - 10.3 mg/dL   Total Protein 6.7 6.5 - 8.1 g/dL   Albumin 3.9 3.5 - 5.0 g/dL   AST 25 15 - 41 U/L   ALT 22 0 - 44 U/L   Alkaline Phosphatase 80 38 - 126 U/L   Total Bilirubin 0.7 0.3 - 1.2 mg/dL   GFR calc non Af Amer >60 >60 mL/min   GFR calc Af Amer >60 >60 mL/min   Anion gap 9 5 - 15    Comment: Performed at [facility], [place] [address], [place], [place] [address]  CBC with Differential     Status: Abnormal   Collection Time: 05/15/18  8:11 AM  Result Value Ref Range   WBC 13.6 (H) 4.0 - 10.5 K/uL   RBC 4.53 4.22 - 5.81 MIL/uL   Hemoglobin 13.9 13.0 - 17.0 g/dL   HCT 41.8 39.0 - 52.0 %   MCV 92.3 80.0 - 100.0 fL   MCH 30.7 26.0 - 34.0 pg   MCHC 33.3 30.0 - 36.0 g/dL   RDW 12.3 11.5 - 15.5 %   Platelets 283 150 - 400 K/uL   nRBC 0.0 0.0 - 0.2 %   Neutrophils Relative % 81 %   Neutro Abs 11.0 (H) 1.7 - 7.7 K/uL   Lymphocytes Relative 9 %   Lymphs Abs 1.2 0.7 - 4.0 K/uL   Monocytes Relative 9 %   Monocytes Absolute 1.2 (H) 0.1 - 1.0 K/uL   Eosinophils Relative 0 %   Eosinophils Absolute 0.1 0.0 - 0.5 K/uL   Basophils Relative 1 %   Basophils Absolute 0.1 0.0 - 0.1 K/uL   Immature Granulocytes 0 %   Abs Immature Granulocytes 0.06 0.00 - 0.07 K/uL    Comment: Performed at [facility], [place] [address], [place], [place] [address]  Urinalysis, Routine w reflex microscopic     Status: Abnormal   Collection Time: 05/15/18  8:11 AM  Result Value Ref Range   Color, Urine STRAW (A) YELLOW   APPearance CLEAR CLEAR   Specific Gravity, Urine 1.003 (L) 1.005 - 1.030   pH 7.0 5.0 - 8.0   Glucose, UA NEGATIVE NEGATIVE mg/dL   Hgb urine dipstick SMALL (A) NEGATIVE   Bilirubin Urine NEGATIVE NEGATIVE   Ketones, ur NEGATIVE NEGATIVE mg/dL   Protein, ur NEGATIVE NEGATIVE mg/dL   Nitrite NEGATIVE NEGATIVE   Leukocytes, UA NEGATIVE NEGATIVE   RBC /  HPF 0-5 0 - 5 RBC/hpf   WBC, UA 0-5 0  - 5 WBC/hpf   Bacteria, UA NONE SEEN NONE SEEN    Comment: Performed at [facility], [place] [address] [place], [place] [address]  Lipase, blood     Status: None   Collection Time: 05/21/18 11:26 AM  Result Value Ref Range   Lipase 29 11 - 51 U/L    Comment: Performed at [facility], [place] [address] [place], [place] [address]  Comprehensive metabolic panel     Status: Abnormal   Collection Time: 05/21/18 11:26 AM  Result Value Ref Range   Sodium 131 (L) 135 - 145 mmol/L   Potassium 4.3 3.5 - 5.1 mmol/L   Chloride 94 (L) 98 - 111 mmol/L   CO2 27 22 - 32 mmol/L   Glucose, Bld 122 (H) 70 - 99 mg/dL   BUN 6 (L) 8 - 23 mg/dL   Creatinine, Ser 0.68 0.61 - 1.24 mg/dL   Calcium 8.9 8.9 - 10.3 mg/dL   Total Protein 7.1 6.5 - 8.1 g/dL   Albumin 4.1 3.5 - 5.0 g/dL   AST 21 15 - 41 U/L   ALT 15 0 - 44 U/L   Alkaline Phosphatase 74 38 - 126 U/L   Total Bilirubin 0.6 0.3 - 1.2 mg/dL   GFR calc non Af Amer >60 >60 mL/min   GFR calc Af Amer >60 >60 mL/min   Anion gap 10 5 - 15    Comment: Performed at [facility], [place] [address] [place], [place] [address]  CBC     Status: Abnormal   Collection Time: 05/21/18 11:26 AM  Result Value Ref Range   WBC 13.0 (H) 4.0 - 10.5 K/uL   RBC 4.62 4.22 - 5.81 MIL/uL   Hemoglobin 14.1 13.0 - 17.0 g/dL   HCT 42.6 39.0 - 52.0 %   MCV 92.2 80.0 - 100.0 fL   MCH 30.5 26.0 - 34.0 pg   MCHC 33.1 30.0 - 36.0 g/dL   RDW 12.8 11.5 - 15.5 %   Platelets 347 150 - 400 K/uL   nRBC 0.0 0.0 - 0.2 %    Comment: Performed at [facility], [place] [address], [place], [place] [address]  Urinalysis, Routine w reflex microscopic     Status: Abnormal   Collection Time: 05/21/18  1:30 PM  Result Value Ref Range   Color, Urine YELLOW YELLOW   APPearance CLEAR CLEAR   Specific Gravity, Urine 1.005 1.005 - 1.030   pH 8.0 5.0 - 8.0   Glucose, UA NEGATIVE NEGATIVE mg/dL   Hgb urine dipstick SMALL (A)  NEGATIVE   Bilirubin Urine NEGATIVE NEGATIVE   Ketones, ur NEGATIVE NEGATIVE mg/dL   Protein, ur NEGATIVE NEGATIVE mg/dL   Nitrite NEGATIVE NEGATIVE   Leukocytes, UA NEGATIVE NEGATIVE   RBC / HPF 0-5 0 - 5 RBC/hpf   WBC, UA 0-5 0 - 5 WBC/hpf   Bacteria, UA NONE SEEN NONE SEEN    Comment: Performed at [facility], [place] [address] [place],  [address]  Comprehensive metabolic panel     Status: Abnormal   Collection Time: 05/25/18  8:35 AM  Result Value Ref Range   Sodium 128 (L) 135 - 145 mmol/L   Potassium 3.8 3.5 - 5.1 mmol/L   Chloride 92 (L) 98 - 111 mmol/L   CO2 26 22 - 32 mmol/L   Glucose, Bld 107 (H) 70 -  99 mg/dL   BUN 5 (L) 8 - 23 mg/dL   Creatinine, Ser 0.61 0.61 - 1.24 mg/dL   Calcium 8.8 (L) 8.9 - 10.3 mg/dL   Total Protein 7.2 6.5 - 8.1 g/dL   Albumin 4.1 3.5 - 5.0 g/dL   AST 22 15 - 41 U/L   ALT 18 0 - 44 U/L   Alkaline Phosphatase 75 38 - 126 U/L   Total Bilirubin 0.6 0.3 - 1.2 mg/dL   GFR calc non Af Amer >60 >60 mL/min   GFR calc Af Amer >60 >60 mL/min   Anion gap 10 5 - 15    Comment: Performed at [facility], [place] [address] [place], [place] [address]  Lipase, blood     Status: None   Collection Time: 05/25/18  8:35 AM  Result Value Ref Range   Lipase 37 11 - 51 U/L    Comment: Performed at [facility], [place] [address] [place], [place] [address]  CBC with Differential     Status: None   Collection Time: 05/25/18  8:35 AM  Result Value Ref Range   WBC 7.5 4.0 - 10.5 K/uL   RBC 4.82 4.22 - 5.81 MIL/uL   Hemoglobin 14.6 13.0 - 17.0 g/dL   HCT 43.5 39.0 - 52.0 %   MCV 90.2 80.0 - 100.0 fL   MCH 30.3 26.0 - 34.0 pg   MCHC 33.6 30.0 - 36.0 g/dL   RDW 12.6 11.5 - 15.5 %   Platelets 343 150 - 400 K/uL   nRBC 0.0 0.0 - 0.2 %   Neutrophils Relative % 74 %   Neutro Abs 5.5 1.7 - 7.7 K/uL   Lymphocytes Relative 12 %   Lymphs Abs 0.9 0.7 - 4.0 K/uL   Monocytes Relative 13 %   Monocytes Absolute  1.0 0.1 - 1.0 K/uL   Eosinophils Relative 0 %   Eosinophils Absolute 0.0 0.0 - 0.5 K/uL   Basophils Relative 1 %   Basophils Absolute 0.0 0.0 - 0.1 K/uL   Immature Granulocytes 0 %   Abs Immature Granulocytes 0.02 0.00 - 0.07 K/uL    Comment: Performed at [facility], [place] [address] [place], [place] [address]  Magnesium     Status: None   Collection Time: 05/25/18  8:35 AM  Result Value Ref Range   Magnesium 2.0 1.7 - 2.4 mg/dL    Comment: Performed at [facility], [place] [address], [place], [place] [address]  Urinalysis, Routine w reflex microscopic     Status: Abnormal   Collection Time: 06/03/18  7:36 AM  Result Value Ref Range   Color, Urine YELLOW YELLOW   APPearance CLEAR CLEAR   Specific Gravity, Urine 1.009 1.005 - 1.030   pH 7.0 5.0 - 8.0   Glucose, UA NEGATIVE NEGATIVE mg/dL   Hgb urine dipstick SMALL (A) NEGATIVE   Bilirubin Urine NEGATIVE NEGATIVE   Ketones, ur NEGATIVE NEGATIVE mg/dL   Protein, ur NEGATIVE NEGATIVE mg/dL   Nitrite NEGATIVE NEGATIVE   Leukocytes, UA NEGATIVE NEGATIVE   RBC / HPF 0-5 0 - 5 RBC/hpf   WBC, UA 0-5 0 - 5 WBC/hpf   Bacteria, UA RARE (A) NONE SEEN   Mucus PRESENT     Comment: Performed at [facility], [place] [address] [place], [place] [address]  CBC with Differential     Status: Abnormal   Collection Time: 06/03/18  7:46 AM  Result Value Ref Range  WBC 11.0 (H) 4.0 - 10.5 K/uL   RBC 4.66 4.22 - 5.81 MIL/uL   Hemoglobin 14.2 13.0 - 17.0 g/dL   HCT 43.6 39.0 - 52.0 %   MCV 93.6 80.0 - 100.0 fL   MCH 30.5 26.0 - 34.0 pg   MCHC 32.6 30.0 - 36.0 g/dL   RDW 12.7 11.5 - 15.5 %   Platelets 314 150 - 400 K/uL   nRBC 0.0 0.0 - 0.2 %   Neutrophils Relative % 76 %   Neutro Abs 8.5 (H) 1.7 - 7.7 K/uL   Lymphocytes Relative 14 %   Lymphs Abs 1.5 0.7 - 4.0 K/uL   Monocytes Relative 8 %   Monocytes Absolute 0.8 0.1 - 1.0 K/uL   Eosinophils Relative 1 %   Eosinophils Absolute 0.1 0.0 -  0.5 K/uL   Basophils Relative 1 %   Basophils Absolute 0.1 0.0 - 0.1 K/uL   Immature Granulocytes 0 %   Abs Immature Granulocytes 0.04 0.00 - 0.07 K/uL    Comment: Performed at [facility], [place] [address] [place], [place] [address]  Basic metabolic panel     Status: Abnormal   Collection Time: 06/03/18  7:46 AM  Result Value Ref Range   Sodium 134 (L) 135 - 145 mmol/L   Potassium 3.9 3.5 - 5.1 mmol/L   Chloride 98 98 - 111 mmol/L   CO2 25 22 - 32 mmol/L   Glucose, Bld 98 70 - 99 mg/dL   BUN 5 (L) 8 - 23 mg/dL   Creatinine, Ser 0.76 0.61 - 1.24 mg/dL   Calcium 9.1 8.9 - 10.3 mg/dL   GFR calc non Af Amer >60 >60 mL/min   GFR calc Af Amer >60 >60 mL/min   Anion gap 11 5 - 15    Comment: Performed at [facility], [place] [address], [place], [place] [address]  Lipase, blood     Status: None   Collection Time: 06/21/18  8:05 AM  Result Value Ref Range   Lipase 34 11 - 51 U/L    Comment: Performed at [facility], [place] [address] [place], [place] [address]  Comprehensive metabolic panel     Status: Abnormal   Collection Time: 06/21/18  8:05 AM  Result Value Ref Range   Sodium 131 (L) 135 - 145 mmol/L   Potassium 4.2 3.5 - 5.1 mmol/L   Chloride 95 (L) 98 - 111 mmol/L   CO2 24 22 - 32 mmol/L   Glucose, Bld 98 70 - 99 mg/dL   BUN 5 (L) 8 - 23 mg/dL   Creatinine, Ser 0.74 0.61 - 1.24 mg/dL   Calcium 9.0 8.9 - 10.3 mg/dL   Total Protein 7.9 6.5 - 8.1 g/dL   Albumin 4.4 3.5 - 5.0 g/dL   AST 22 15 - 41 U/L   ALT 21 0 - 44 U/L   Alkaline Phosphatase 77 38 - 126 U/L   Total Bilirubin 0.9 0.3 - 1.2 mg/dL   GFR calc non Af Amer >60 >60 mL/min   GFR calc Af Amer >60 >60 mL/min   Anion gap 12 5 - 15    Comment: Performed at [facility], [place] [address] [place], [place] [address]  CBC     Status: Abnormal   Collection Time: 06/21/18  8:05 AM  Result Value Ref Range   WBC 11.4 (H) 4.0 - 10.5 K/uL   RBC 5.25 4.22  - 5.81 MIL/uL  Hemoglobin 15.8 13.0 - 17.0 g/dL   HCT 48.5 39.0 - 52.0 %   MCV 92.4 80.0 - 100.0 fL   MCH 30.1 26.0 - 34.0 pg   MCHC 32.6 30.0 - 36.0 g/dL   RDW 13.0 11.5 - 15.5 %   Platelets 225 150 - 400 K/uL   nRBC 0.0 0.0 - 0.2 %    Comment: Performed at [facility], [place] [address] [place], [place] [address]  Urinalysis, Routine w reflex microscopic     Status: Abnormal   Collection Time: 06/21/18  8:05 AM  Result Value Ref Range   Color, Urine STRAW (A) YELLOW   APPearance CLEAR CLEAR   Specific Gravity, Urine 1.005 1.005 - 1.030   pH 7.0 5.0 - 8.0   Glucose, UA NEGATIVE NEGATIVE mg/dL   Hgb urine dipstick SMALL (A) NEGATIVE   Bilirubin Urine NEGATIVE NEGATIVE   Ketones, ur NEGATIVE NEGATIVE mg/dL   Protein, ur NEGATIVE NEGATIVE mg/dL   Nitrite NEGATIVE NEGATIVE   Leukocytes, UA NEGATIVE NEGATIVE   RBC / HPF 0-5 0 - 5 RBC/hpf   WBC, UA 0-5 0 - 5 WBC/hpf   Bacteria, UA NONE SEEN NONE SEEN   Mucus PRESENT     Comment: Performed at [facility], [place] [address], [place], [place] [address]  Comprehensive metabolic panel     Status: Abnormal   Collection Time: 06/24/18 12:37 PM  Result Value Ref Range   Sodium 130 (L) 135 - 145 mmol/L   Potassium 3.8 3.5 - 5.1 mmol/L   Chloride 93 (L) 98 - 111 mmol/L   CO2 26 22 - 32 mmol/L   Glucose, Bld 92 70 - 99 mg/dL   BUN 5 (L) 8 - 23 mg/dL   Creatinine, Ser 0.68 0.61 - 1.24 mg/dL   Calcium 8.9 8.9 - 10.3 mg/dL   Total Protein 7.1 6.5 - 8.1 g/dL   Albumin 4.2 3.5 - 5.0 g/dL   AST 20 15 - 41 U/L   ALT 19 0 - 44 U/L   Alkaline Phosphatase 77 38 - 126 U/L   Total Bilirubin 0.5 0.3 - 1.2 mg/dL   GFR calc non Af Amer >60 >60 mL/min   GFR calc Af Amer >60 >60 mL/min   Anion gap 11 5 - 15    Comment: Performed at [facility], [place] [address], [place], [place] [address]  Ethanol     Status: None   Collection Time: 06/24/18 12:37 PM  Result Value Ref Range   Alcohol, Ethyl  (B) <10 <10 mg/dL    Comment: (NOTE) Lowest detectable limit for serum alcohol is 10 mg/dL. For medical purposes only. Performed at [facility], [place] [address], [place], [place] [address]   Salicylate level     Status: None   Collection Time: 06/24/18 12:37 PM  Result Value Ref Range   Salicylate Lvl <2.9 2.8 - 30.0 mg/dL    Comment: Performed at [facility], [place] [address] [place], [place] [address]  Acetaminophen level     Status: Abnormal   Collection Time: 06/24/18 12:37 PM  Result Value Ref Range   Acetaminophen (Tylenol), Serum <10 (L) 10 - 30 ug/mL    Comment: (NOTE) Therapeutic concentrations vary significantly. A range of 10-30 ug/mL  may be an effective concentration for many patients. However, some  are best treated at concentrations outside of this range. Acetaminophen concentrations >150 ug/mL at 4 hours after ingestion  and >50  ug/mL at 12 hours after ingestion are often associated with  toxic reactions. Performed at [facility], [place] [address] [place], [place] [address]   cbc     Status: Abnormal   Collection Time: 06/24/18 12:37 PM  Result Value Ref Range   WBC 12.0 (H) 4.0 - 10.5 K/uL   RBC 4.68 4.22 - 5.81 MIL/uL   Hemoglobin 14.1 13.0 - 17.0 g/dL   HCT 42.6 39.0 - 52.0 %   MCV 91.0 80.0 - 100.0 fL   MCH 30.1 26.0 - 34.0 pg   MCHC 33.1 30.0 - 36.0 g/dL   RDW 13.2 11.5 - 15.5 %   Platelets 339 150 - 400 K/uL   nRBC 0.0 0.0 - 0.2 %    Comment: Performed at [facility], [place] [address], [place], [place] [address]  Rapid urine drug screen (hospital performed)     Status: Abnormal   Collection Time: 06/24/18 12:53 PM  Result Value Ref Range   Opiates NONE DETECTED NONE DETECTED   Cocaine NONE DETECTED NONE DETECTED   Benzodiazepines POSITIVE (A) NONE DETECTED   Amphetamines NONE DETECTED NONE DETECTED   Tetrahydrocannabinol NONE DETECTED NONE DETECTED   Barbiturates NONE  DETECTED NONE DETECTED    Comment: (NOTE) DRUG SCREEN FOR MEDICAL PURPOSES ONLY.  IF CONFIRMATION IS NEEDED FOR ANY PURPOSE, NOTIFY LAB WITHIN 5 DAYS. LOWEST DETECTABLE LIMITS FOR URINE DRUG SCREEN Drug Class                     Cutoff (ng/mL) Amphetamine and metabolites    1000 Barbiturate and metabolites    200 Benzodiazepine                 620 Tricyclics and metabolites     300 Opiates and metabolites        300 Cocaine and metabolites        300 THC                            50 Performed at [facility], [place] [address] [place], [place] [address]   PSA     Status: None   Collection Time: 06/26/18  6:27 AM  Result Value Ref Range   Prostatic Specific Antigen 1.36 0.00 - 4.00 ng/mL    Comment: (NOTE) While PSA levels of <=4.0 ng/ml are reported as reference range, some men with levels below 4.0 ng/ml can have prostate cancer and many men with PSA above 4.0 ng/ml do not have prostate cancer.  Other tests such as free PSA, age specific reference ranges, PSA velocity and PSA doubling time may be helpful especially in men less than 41 years old. Performed at [facility], [place] [address] [place], [place] [address]   TSH     Status: None   Collection Time: 06/26/18  6:27 AM  Result Value Ref Range   TSH 0.921 0.350 - 4.500 uIU/mL    Comment: Performed by a 3rd Generation assay with a functional sensitivity of <=0.01 uIU/mL. Performed at [facility], [place] [address] [place], [place] [address]   Basic metabolic panel     Status: Abnormal   Collection Time: 06/26/18  6:27 AM  Result Value Ref Range   Sodium 132 (L) 135 - 145 mmol/L   Potassium 3.9 3.5 - 5.1 mmol/L   Chloride 95 (L) 98 - 111 mmol/L   CO2 26 22 -  32 mmol/L   Glucose, Bld 102 (H) 70 - 99 mg/dL   BUN 6 (L) 8 - 23 mg/dL   Creatinine, Ser 0.70 0.61 - 1.24 mg/dL   Calcium 8.8 (L) 8.9 - 10.3 mg/dL   GFR calc non Af Amer >60 >60 mL/min   GFR calc Af Amer  >60 >60 mL/min   Anion gap 11 5 - 15    Comment: Performed at [facility], [place] [address], [place], [place] [address]  Vitamin B12     Status: None   Collection Time: 06/26/18  6:27 AM  Result Value Ref Range   Vitamin B-12 520 180 - 914 pg/mL    Comment: (NOTE) This assay is not validated for testing neonatal or myeloproliferative syndrome specimens for Vitamin B12 levels. Performed at [facility], [place] [address], [place], [place] [address]   VITAMIN D 25 Hydroxy (Vit-D Deficiency, Fractures)     Status: None   Collection Time: 06/26/18  6:27 AM  Result Value Ref Range   Vit D, 25-Hydroxy 38.0 30.0 - 100.0 ng/mL    Comment: (NOTE) Vitamin D deficiency has been defined by the [place] practice guideline as a level of serum 25-OH vitamin D less than 20 ng/mL (1,2). The Endocrine Society went on to further define vitamin D insufficiency as a level between 21 and 29 ng/mL (2). 1. IOM (Institute of Medicine). 2010. Dietary reference   intakes for calcium and D. [place]: The   [employer]. 2. Holick MF, [place], Bischoff-Ferrari HA, et al.   Evaluation, treatment, and prevention of vitamin D   deficiency: an Endocrine Society clinical practice   guideline. JCEM. 2011 Jul; 96(7):1911-30. Performed At: [facility] [place], [place] [id number] Rush Farmer MD [id number]   Folate     Status: None   Collection Time: 06/26/18  6:27 AM  Result Value Ref Range   Folate 21.3 >5.9 ng/mL    Comment: Performed at [facility], [place] [address], [place], [place] [address]  Basic metabolic panel     Status: Abnormal   Collection Time: 06/28/18  6:57 AM  Result Value Ref Range   Sodium 132 (L) 135 - 145 mmol/L   Potassium 3.6 3.5 - 5.1 mmol/L   Chloride 95 (L) 98 - 111 mmol/L   CO2 25 22 - 32 mmol/L   Glucose, Bld 107 (H) 70 - 99 mg/dL   BUN 7 (L)  8 - 23 mg/dL   Creatinine, Ser 0.62 0.61 - 1.24 mg/dL   Calcium 9.2 8.9 - 10.3 mg/dL   GFR calc non Af Amer >60 >60 mL/min   GFR calc Af Amer >60 >60 mL/min   Anion gap 12 5 - 15    Comment: Performed at [facility], [place] [address] [place],  [address]     Blood Alcohol level:  Lab Results  Component Value Date   [facility] <10 06/24/2018   ETH <10 69/67/8938    Metabolic Disorder Labs:  Lab Results  Component Value Date   HGBA1C 5.3 11/12/2016   MPG 105 11/12/2016   No results found for: PROLACTIN Lab Results  Component Value Date   CHOL 177 11/12/2016   TRIG 106 11/12/2016   HDL 48 11/12/2016   CHOLHDL 3.7 11/12/2016   VLDL 21 11/12/2016   LDLCALC 108 (H) 11/12/2016   LDLCALC 126 (H) 12/17/2013    See Psychiatric Specialty Exam  and Suicide Risk Assessment completed by Attending Physician prior to discharge.  Discharge destination:  Home  Is patient on multiple antipsychotic therapies at discharge:  No   Has Patient had three or more failed trials of antipsychotic monotherapy by history:  No  Recommended Plan for Multiple Antipsychotic Therapies: NA  Discharge Instructions    Diet - low sodium heart healthy   Complete by:  As directed    Discharge instructions   Complete by:  As directed    Patient is instructed to take all prescribed medications as recommended. Report any side effects or adverse reactions to your outpatient psychiatrist. Patient is instructed to abstain from alcohol and illegal drugs while on prescription medications. In the event of worsening symptoms, patient is instructed to call the crisis hotline, 911, or go to the nearest emergency department for evaluation and treatment.   Discharge instructions   Complete by:  As directed    Take all medications as prescribed. Keep all follow-up appointments as scheduled.  Do not consume alcohol or use illegal drugs while on prescription medications. Report any adverse effects  from your medications to your primary care provider promptly.  In the event of recurrent symptoms or worsening symptoms, call 911, a crisis hotline, or go to the nearest emergency department for evaluation.   Increase activity slowly   Complete by:  As directed      Allergies as of 06/29/2018      Reactions   Diphenhydramine Hcl Other (See Comments)   Restlessness   Flexeril [cyclobenzaprine] Other (See Comments)   Restlessness   Hctz [hydrochlorothiazide] Other (See Comments)   Caused to lose sodium when taking with Lisinopril   Nsaids Other (See Comments)   GI upset   Hydroxyzine Other (See Comments)   Restlessness      Medication List    STOP taking these medications   ondansetron 8 MG disintegrating tablet Commonly known as:  ZOFRAN ODT   zolpidem 10 MG tablet Commonly known as:  AMBIEN     TAKE these medications     Indication  acetaminophen 325 MG tablet Commonly known as:  TYLENOL Take 2 tablets (650 mg total) by mouth every 6 (six) hours as needed for mild pain. (May buy over the counter) What changed:    medication strength  how much to take  reasons to take this  additional instructions  Indication:  Pain   gabapentin 100 MG capsule Commonly known as:  NEURONTIN Take 1 capsule (100 mg total) by mouth 2 (two) times daily.  Indication:  Disease of the Peripheral Nerves   hydrocortisone 2.5 % rectal cream Commonly known as:  ANUSOL-HC Place rectally 2 (two) times daily as needed for anal itching.  Indication:  Anal itching   lisinopril 10 MG tablet Commonly known as:  PRINIVIL,ZESTRIL Take 1 tablet (10 mg total) by mouth daily. What changed:  when to take this  Indication:  High Blood Pressure Disorder   LORazepam 1 MG tablet Commonly known as:  ATIVAN Take 1 tablet (1 mg total) by mouth every 6 (six) hours as needed (CIWA > 10).  Indication:  Alcohol Withdrawal Syndrome   mirtazapine 7.5 MG tablet Commonly known as:  REMERON Take 1 tablet  (7.5 mg total) by mouth at bedtime.  Indication:  Major Depressive Disorder   multivitamin with minerals Tabs tablet Take 1 tablet by mouth daily. (May buy over the counter)  Indication:  Supplementation   pantoprazole 40 MG tablet Commonly known as:  PROTONIX  Take 1 tablet (40 mg total) by mouth daily.  Indication:  Gastroesophageal Reflux Disease   QUEtiapine 25 MG tablet Commonly known as:  SEROQUEL Take 1 tablet (25 mg total) by mouth at bedtime. For mood  Indication:  Mood   sodium chloride 0.65 % Soln nasal spray Commonly known as:  OCEAN Place 2 sprays into both nostrils as needed for congestion.  Indication:  Nasal congestion   sucralfate 1 g tablet Commonly known as:  CARAFATE Take 1 tablet (1 g total) by mouth 4 (four) times daily -  with meals and at bedtime. Chew before swallowing What changed:  when to take this  Indication:  Ulcer of the Duodenum   traZODone 50 MG tablet Commonly known as:  DESYREL Take 1 tablet (50 mg total) by mouth at bedtime as needed for sleep. What changed:    medication strength  how much to take  when to take this  reasons to take this  Indication:  Trouble Sleeping   verapamil 240 MG CR tablet Commonly known as:  CALAN-SR Take 240 mg by mouth at bedtime.  Indication:  High Blood Pressure of Unknown Cause      Follow-up Information    [facility]. Go on 07/03/2018.   Why:  Hospital follow up appointment with Dr.Sun is 1/29 at 10:00a.  Please bring current medications and discharge paperwork from this hospitalization.  Contact information: [place] [address] p: [phone] P: [phone]       [facility] Follow up.   Why:  Your [employer] will continue services after discharge.  Contact information: [place] [address] p: [place] [phone]          Follow-up recommendations:  Activity:  as tolerateed Diet:  as  toelrated  Comments:  See discharge instructions above.   Signed: Derrill Center, NP 06/29/2018, 9:52 AM   Patient seen, Suicide Assessment Completed.  Disposition Plan Reviewed

## 2018-06-29 NOTE — Progress Notes (Signed)
Pt awoke and complained of sweats and difficulty sleeping.   He asks "can't you ask for a Trazodone or something?"  On-site provider notified of pt's status and Ativan 0.5 mg POX1 was ordered and administered.  Pt stated "thank you."  Pt was provided with new scrubs "because I don't want to sleep in these sweaty ones."  Pt denies further needs and concerns.  1:1 staff remains with pt for safety.  Will continue to monitor and assess.

## 2018-06-29 NOTE — [facility]HH Suicide Risk Assessment (Addendum)
[facility] Discharge Suicide Risk Assessment   Principal Problem: Depression, Anxiety, BZD Abuse  Discharge Diagnoses: Active Problems:   Major depressive disorder, recurrent severe without psychotic features ([place])   Total Time spent with patient: 30 minutes  Musculoskeletal: Strength & Muscle Tone: within normal limits-no current psychomotor agitation, minimal distal tremors, no diaphoresis Gait & Station: normal Patient leans: N/A  Psychiatric Specialty Exam: Review of Systems  Genitourinary: Negative for flank pain.   denies headache, no chest pain, no shortness of breath at room air, no vomiting, no rash, no fever  Blood pressure (!) 178/97, pulse 81, temperature 98.2 F (36.8 C), temperature source Oral, resp. rate 16, height 5' 11"  (1.803 m), weight 79.5 kg.Body mass index is 24.44 kg/m.  General Appearance: Improving grooming  Eye Contact::  Good  Speech:  Normal Rate409  Volume:  Normal  Mood:  Acknowledges he is feeling better than he did on admission, describes chronic/lingering anxiety which is partially improved compared to admission  Affect:  More reactive, smiles at times appropriately, remains vaguely anxious  Thought Process:  Linear and Descriptions of Associations: Intact  Orientation:  Full (Time, Place, and Person)  Thought Content:  Denies hallucinations, no delusions, less severely somatically focused  Suicidal Thoughts:  No denies suicidal or self-injurious ideations, denies homicidal or violent ideations.  Homicidal Thoughts:  No  Memory:  Recent and remote grossly intact  Judgement:  Fair/improving  Insight:  Fair  Psychomotor Activity:  Normal-no current psychomotor restlessness or agitation, does not appear to be in any acute distress  Concentration:  Good  Recall:  Good  Fund of Knowledge:Good  Language: Good  Akathisia:  Negative  Handed:  Right  AIMS (if indicated):     Assets:  Communication Skills Desire for Improvement Resilience  Sleep:   Number of Hours: 3.75  Cognition: WNL  ADL's:  Intact   Mental Status Per Nursing Assessment::   On Admission:  NA  Demographic Factors:  Single, no children, lives alone, on disability  Loss Factors: Chronic anxiety, lives alone, concerns about his physical health  Historical Factors: No prior psychiatric admissions, denies prior history of suicide attempts, reports long history of anxiety. Collateral information from CST team staff is that patient has history of alcohol and benzodiazepine abuse/misuse  Risk Reduction Factors:   Positive coping skills or problem solving skills  Continued Clinical Symptoms:  At present patient is alert, attentive, better groomed, good eye contact, states he is feeling better, minimizes depression, acknowledges persistent anxiety although does state that it is better than on admission.  Overall affect presents more reactive.  No thought disorder, no suicidal ideations, no homicidal ideations, no hallucinations, no delusions, less somatically focused.  Future oriented, states he plans to follow-up with his gastroenterologist for further work-up/endoscopy, and with his PCP for further medical work up as needed. States he is motivated in maintaining sobriety/abstinence, and expresses increased insight regarding negative impact that benzodiazepine misuse can have on his level of functioning.   Gait has improved, currently slow but steady.  No disruptive or agitated behaviors on unit We have reviewed medication side effects. He is aware of sedation risk associated with Remeron  Cognitive Features That Contribute To Risk:  No gross cognitive deficits noted upon discharge. Is alert , attentive, and oriented x 3   Suicide Risk:  Mild:  Suicidal ideation of limited frequency, intensity, duration, and specificity.  There are no identifiable plans, no associated intent, mild dysphoria and related symptoms, good self-control (both objective and  subjective  assessment), few other risk factors, and identifiable protective factors, including available and accessible social support.  [facility]. Go on 07/03/2018.   Why:  Hospital follow up appointment with Dr.Sun is 1/29 at 10:00a.  Please bring current medications and discharge paperwork from this hospitalization.  Contact information: [place] [address] p: [phone] A: [phone]       [facility] Follow up.   Why:  Your [employer] will continue services after discharge.  Contact information: [place] [address] p: [place] [phone]          Plan Of Care/Follow-up recommendations:  Activity:  as tolerated Diet:  regular Tests:  NA Other:  See below  Patient is requesting discharge, I have offered further inpatient treatment in order to continue working on management of anxiety/ somatic preoccupations, but he states he is feeling a lot better and feels ready to discharge. At this time there are no current grounds for involuntary commitment. He plans to return home.  Plans to follow-up as above.  He is connected to outpatient services including CST/ [facility] He plans to follow-up with his primary care doctor, Dr. Nancy Fetter, for medical issues as needed and states he has an appointment 2/7 with his GI Specialist, Dr. Carlean Purl at [facility], to address his concerns of possibly having GI malignancy.  Jenne Campus, MD 06/29/2018, 10:39 AM

## 2018-06-29 NOTE — Progress Notes (Signed)
Pt is resting in bed with eyes closed.  Respirations are even and unlabored.  No distress noted.  1:1 staff remains with pt for safety.  Pt is safe.  Will continue to monitor and assess.

## 2018-06-29 NOTE — Progress Notes (Signed)
Patient is prepared for his discharge as this writer reviewed dc instructions with him, including giving pt cc of dc instructions ( SRA, AVS, SSP and transition record). Pt stated he understod dc instructions and will keep f/u appointments as planned. Pt given all belongings in his locker , as well as prescriptions for his meds and sample meds from the pharmacy per MD order. Pt answered questions on self inventory and on this he stated he was not having suicidal ideaitons today and he rated his feelings of depression, hopelessness and anxiety " 5/3/8", resepctively. HE stated he was " ready to get on with my life". Per instruction from LCSW, pt was given cab voucher for transportation home via [facility] cab and staff arranged BB to pick him up. Pt was escorted by this Probation officer to entrance of the building , Probation officer carried all of patient's belongings and patient was assisted by this Probation officer to get into cab. Pt dc'd home per MD order.

## 2018-06-29 NOTE — Progress Notes (Signed)
  [facility] Adult Case Management Discharge Plan :  Will you be returning to the same living situation after discharge:  Yes,  lives alone. At discharge, do you have transportation home?: Yes,  via self and or public transportation. Do you have the ability to pay for your medications: Yes,  patient denies any barriers.   Patient to Follow up at: [facility]. Go on 07/03/2018.   Why:  Hospital follow up appointment with Dr.Sun is 1/29 at 10:00a.  Please bring current medications and discharge paperwork from this hospitalization.  Contact information: [place] [address] p: [phone] O: [phone]       [facility] Follow up.   Why:  Your [employer] will continue services after discharge.  Contact information: [place] [address] p: [place] [phone]           Safety Planning and Suicide Prevention discussed: Yes,  completed with [place], Herbie [place] [phone])  Have you used any form of tobacco in the last 30 days? (Cigarettes, Smokeless Tobacco, Cigars, and/or Pipes): No  Has patient been referred to the Quitline?: Patient refused referral  Patient has been referred for addiction treatment: N/A  Tye Savoy, LCSW 06/29/2018, 9:48 AM

## 2018-06-29 NOTE — Progress Notes (Signed)
1:1 D Pt is observed OOB standing at the med window with 1:1 MHT at his side. HE is flat, depressed and nervous and anxious. HE interhjects " I need my medicines now" befroie this nurse has the chance to  Greet him this am. HE is somatic, complaining of dirrhea- he refuses diarrhea prn then wants it 5 min later, chnaging his mind frequently. HE does the same with his neurontin.  A He answered the questions on his daily assessment, denying having SI today. He is hoping to be dc'd home today. His gait remains unsteady, he is wobbly, weak and a high fall risk.  R 1:1 intact and pt safe.

## 2018-06-29 NOTE — BHH Group Notes (Signed)
LCSW Group Therapy Note  06/29/2018   10:00-11:00am   Type of Therapy and Topic:  Group Therapy: Anger Cues and Responses  Participation Level:  Did Not Attend   Anthony Lamb

## 2018-07-02 ENCOUNTER — Other Ambulatory Visit: Payer: Self-pay

## 2018-07-02 ENCOUNTER — Emergency Department (HOSPITAL_COMMUNITY)
Admission: EM | Admit: 2018-07-02 | Discharge: 2018-07-02 | Disposition: A | Discharge status: Home / Self Care | Payer: Medicaid Other | Attending: Emergency Medicine | Admitting: Emergency Medicine

## 2018-07-02 ENCOUNTER — Encounter (HOSPITAL_COMMUNITY): Payer: Self-pay

## 2018-07-02 DIAGNOSIS — Z87891 Personal history of nicotine dependence: Secondary | ICD-10-CM | POA: Diagnosis not present

## 2018-07-02 DIAGNOSIS — Z79899 Other long term (current) drug therapy: Secondary | ICD-10-CM | POA: Diagnosis not present

## 2018-07-02 DIAGNOSIS — I10 Essential (primary) hypertension: Secondary | ICD-10-CM | POA: Insufficient documentation

## 2018-07-02 DIAGNOSIS — R5383 Other fatigue: Secondary | ICD-10-CM | POA: Diagnosis present

## 2018-07-02 DIAGNOSIS — R109 Unspecified abdominal pain: Secondary | ICD-10-CM | POA: Insufficient documentation

## 2018-07-02 DIAGNOSIS — F419 Anxiety disorder, unspecified: Secondary | ICD-10-CM | POA: Diagnosis not present

## 2018-07-02 LAB — URINALYSIS, ROUTINE W REFLEX MICROSCOPIC
BACTERIA UA: NONE SEEN
Bilirubin Urine: NEGATIVE
Glucose, UA: NEGATIVE mg/dL
KETONES UR: NEGATIVE mg/dL
Nitrite: NEGATIVE
Protein, ur: NEGATIVE mg/dL
Specific Gravity, Urine: 1.012 (ref 1.005–1.030)
pH: 7 (ref 5.0–8.0)

## 2018-07-02 LAB — CBC
HEMATOCRIT: 43.4 % (ref 39.0–52.0)
HEMOGLOBIN: 14.2 g/dL (ref 13.0–17.0)
MCH: 29.9 pg (ref 26.0–34.0)
MCHC: 32.7 g/dL (ref 30.0–36.0)
MCV: 91.4 fL (ref 80.0–100.0)
Platelets: 306 10*3/uL (ref 150–400)
RBC: 4.75 MIL/uL (ref 4.22–5.81)
RDW: 13.6 % (ref 11.5–15.5)
WBC: 9.5 10*3/uL (ref 4.0–10.5)
nRBC: 0 % (ref 0.0–0.2)

## 2018-07-02 LAB — COMPREHENSIVE METABOLIC PANEL
ALT: 22 U/L (ref 0–44)
AST: 20 U/L (ref 15–41)
Albumin: 4.4 g/dL (ref 3.5–5.0)
Alkaline Phosphatase: 66 U/L (ref 38–126)
Anion gap: 11 (ref 5–15)
BUN: 7 mg/dL — ABNORMAL LOW (ref 8–23)
CHLORIDE: 96 mmol/L — AB (ref 98–111)
CO2: 24 mmol/L (ref 22–32)
Calcium: 8.9 mg/dL (ref 8.9–10.3)
Creatinine, Ser: 0.71 mg/dL (ref 0.61–1.24)
GFR calc Af Amer: >60 mL/min (ref >60)
GFR calc non Af Amer: >60 mL/min (ref >60)
Glucose, Bld: 99 mg/dL (ref 70–99)
Potassium: 4.1 mmol/L (ref 3.5–5.1)
Sodium: 131 mmol/L — ABNORMAL LOW (ref 135–145)
Total Bilirubin: 0.8 mg/dL (ref 0.3–1.2)
Total Protein: 7.4 g/dL (ref 6.5–8.1)

## 2018-07-02 LAB — LIPASE, BLOOD: Lipase: 36 U/L (ref 11–51)

## 2018-07-02 MED ORDER — SODIUM CHLORIDE 0.9% FLUSH: 3.0000 mL | Freq: Once | INTRAVENOUS | Status: DC

## 2018-07-02 MED ORDER — LORAZEPAM 1 MG PO TABS
1.0000 mg | ORAL_TABLET | Freq: Once | ORAL | Status: AC
  Administered 2018-07-02: 1 mg via ORAL
  Filled 2018-07-02: qty 1

## 2018-07-02 MED ORDER — ACETAMINOPHEN 325 MG PO TABS
650.0000 mg | ORAL_TABLET | Freq: Once | ORAL | Status: AC
  Administered 2018-07-02: 650 mg via ORAL
  Filled 2018-07-02: qty 2

## 2018-07-02 NOTE — Discharge Instructions (Addendum)
See your Physician for recheck as scheduled tomorrow

## 2018-07-02 NOTE — ED Provider Notes (Signed)
[facility] DEPT Provider Note   CSN: [id number] Arrival date & time: 07/02/18  4431     History   Chief Complaint Chief Complaint  Patient presents with  . medication withdrawal  . Fatigue  . Abdominal Pain    HPI Anthony Lamb is a 63 y.o. male.  The history is provided by the patient. No language interpreter was used.  Abdominal Pain  Pain location:  Generalized Pain severity:  Moderate Onset quality:  Gradual Timing:  Constant Progression:  Worsening Worsened by:  Nothing Ineffective treatments:  None tried Associated symptoms: nausea   Pt complains of decreased appetite.  Pt reports he has not taken lorazepam in 2 days.  Pt complains of feeling anxious.  Pt reports he does not have an appetite.  Pt refused zofran for nausea   Past Medical History:  Diagnosis Date  . Alcohol abuse, in remission   . Anemia   . Ankylosing spondylitis ([place])   . Anxiety   . Chronic diarrhea   . Chronic pain   . Colitis   . Depression   . Esophagitis   . Gastritis   . GERD (gastroesophageal reflux disease)   . Hypertension   . IBS (irritable bowel syndrome)   . Poor dentition   . SIADH (syndrome of inappropriate ADH production) [facility])     Patient Active Problem List   Diagnosis Date Noted  . Major depressive disorder, recurrent severe without psychotic features ([place]) 06/24/2018  . Alcohol withdrawal ([place]) 02/08/2018  . IBS (irritable bowel syndrome)   . Hypertension   . Colitis   . Adjustment disorder with mixed anxiety and depressed mood 05/11/2017  . Alcohol abuse   . Anxiety   . Gastroesophageal reflux disease   . Osteopenia determined by x-ray 08/06/2014  . Stress fracture of calcaneus 08/06/2014  . Vitamin D deficiency 12/18/2013  . Dementia ([place]) 12/18/2013  . Benign microscopic hematuria 07/06/2013  . SIADH (syndrome of inappropriate ADH production) ([place]) 06/22/2013  . Ankylosing spondylitis ([place]) 06/20/2013  . Malnutrition  of moderate degree ([place]) 06/20/2013  . BPH (benign prostatic hyperplasia) 08/22/2010  . History of colonic polyps 08/13/2008  . Essential hypertension 07/03/2007  . PUD (peptic ulcer disease) 07/03/2007    Past Surgical History:  Procedure Laterality Date  . COLONOSCOPY W/ BIOPSIES    . ESOPHAGOGASTRODUODENOSCOPY    . HERNIA REPAIR Bilateral   . OPEN REDUCTION INTERNAL FIXATION (ORIF) DISTAL RADIAL FRACTURE Right 02/11/2018   Procedure: OPEN REDUCTION INTERNAL FIXATION (ORIF) DISTAL RADIAL FRACTURE;  Surgeon: Milly Jakob, MD;  Location: [facility];  Service: Orthopedics;  Laterality: Right;  . TONSILLECTOMY          Home Medications    Prior to Admission medications   Medication Sig Start Date End Date Taking? Authorizing Provider  acetaminophen (TYLENOL) 325 MG tablet Take 2 tablets (650 mg total) by mouth every 6 (six) hours as needed for mild pain. (May buy over the counter) 06/27/18   Connye Burkitt, NP  gabapentin (NEURONTIN) 100 MG capsule Take 1 capsule (100 mg total) by mouth 2 (two) times daily. 06/29/18   Derrill Center, NP  hydrocortisone (ANUSOL-HC) 2.5 % rectal cream Place rectally 2 (two) times daily as needed for anal itching. 06/27/18   Connye Burkitt, NP  lisinopril (PRINIVIL,ZESTRIL) 10 MG tablet Take 1 tablet (10 mg total) by mouth daily. Patient taking differently: Take 10 mg by mouth at bedtime.  08/21/17 06/24/18  Melanee Spry, MD  LORazepam (ATIVAN) 1 MG tablet Take 1 tablet (1 mg total) by mouth every 6 (six) hours as needed (CIWA > 10). 06/27/18   Connye Burkitt, NP  mirtazapine (REMERON) 7.5 MG tablet Take 1 tablet (7.5 mg total) by mouth at bedtime. 06/29/18   Derrill Center, NP  Multiple Vitamin (MULTIVITAMIN WITH MINERALS) TABS tablet Take 1 tablet by mouth daily. (May buy over the counter) 06/28/18   Connye Burkitt, NP  pantoprazole (PROTONIX) 40 MG tablet Take 1 tablet (40 mg total) by mouth daily. 04/24/14   Janith Lima, MD  QUEtiapine (SEROQUEL) 25  MG tablet Take 1 tablet (25 mg total) by mouth at bedtime. For mood 06/27/18   Connye Burkitt, NP  sodium chloride (OCEAN) 0.65 % SOLN nasal spray Place 2 sprays into both nostrils as needed for congestion.    [provider]  sucralfate (CARAFATE) 1 G tablet Take 1 tablet (1 g total) by mouth 4 (four) times daily -  with meals and at bedtime. Chew before swallowing Patient taking differently: Take 1 g by mouth 2 (two) times daily. Chew before swallowing 12/17/13   Janith Lima, MD  traZODone (DESYREL) 50 MG tablet Take 1 tablet (50 mg total) by mouth at bedtime as needed for sleep. 06/27/18   Connye Burkitt, NP  verapamil (CALAN-SR) 240 MG CR tablet Take 240 mg by mouth at bedtime. 02/09/17   [provider]    Family History Family History  Problem Relation Age of Onset  . Anxiety disorder Mother   . Congestive Heart Failure Mother   . Crohn's disease Mother   . Colon cancer Mother 21  . Arthritis Father   . High blood pressure Father     Social History Social History   Tobacco Use  . Smoking status: Former Smoker    Types: Cigarettes    Last attempt to quit: 2014    Years since quitting: 6.0  . Smokeless tobacco: Never Used  Substance Use Topics  . Alcohol use: Not Currently    Comment: none x 1 week  . Drug use: No     Allergies   Diphenhydramine hcl; Flexeril [cyclobenzaprine]; Hctz [hydrochlorothiazide]; Nsaids; and Hydroxyzine   Review of Systems Review of Systems  Gastrointestinal: Positive for abdominal pain and nausea.  All other systems reviewed and are negative.    Physical Exam Updated Vital Signs BP (!) 169/84 (BP Location: Left Arm)   Pulse 76   Temp 98 F (36.7 C) (Oral)   Resp 18   Ht 5' 10"  (1.778 m)   Wt 79.4 kg   SpO2 97%   BMI 25.11 kg/m   Physical Exam Vitals signs and nursing note reviewed.  Constitutional:      Appearance: He is well-developed.  HENT:     Head: Normocephalic.     Mouth/Throat:     Mouth: Mucous  membranes are moist.  Eyes:     Extraocular Movements: Extraocular movements intact.  Neck:     Musculoskeletal: Normal range of motion.  Cardiovascular:     Rate and Rhythm: Normal rate.  Pulmonary:     Effort: Pulmonary effort is normal.  Abdominal:     General: Abdomen is flat. Bowel sounds are normal. There is no distension.     Palpations: Abdomen is soft.  Musculoskeletal: Normal range of motion.  Skin:    General: Skin is warm.  Neurological:     Mental Status: He is alert and oriented to person,  place, and time.      ED Treatments / Results  Labs (all labs ordered are listed, but only abnormal results are displayed) Labs Reviewed  COMPREHENSIVE METABOLIC PANEL - Abnormal; Notable for the following components:      Result Value   Sodium 131 (*)    Chloride 96 (*)    BUN 7 (*)    All other components within normal limits  LIPASE, BLOOD  CBC  URINALYSIS, ROUTINE W REFLEX MICROSCOPIC    EKG None  Radiology No results found.  Procedures Procedures (including critical care time)  Medications Ordered in ED Medications  sodium chloride flush (NS) 0.9 % injection 3 mL (has no administration in time range)  LORazepam (ATIVAN) tablet 1 mg (1 mg Oral Given 07/02/18 1439)     Initial Impression / Assessment and Plan / ED Course  I have reviewed the triage vital signs and the nursing notes.  Pertinent labs & imaging results that were available during my care of the patient were reviewed by me and considered in my medical decision making (see chart for details).     MDM  Pt has a normal heart rate, no sign of withdrawal.  No vomiting. Pt declined zofran.  Pt thinks he needs IV fluids.  Pt's labs reviewed.  Pt given oral dosage of ativan.  Pt needs to follow up with his MD.   Final Clinical Impressions(s) / ED Diagnoses   Final diagnoses:  Anxiety    ED Discharge Orders    None    An After Visit Summary was printed and given to the patient.    Sidney Ace 07/02/18 1504    Carmin Muskrat, MD 07/02/18 [phone]

## 2018-07-02 NOTE — ED Triage Notes (Signed)
Per EMS- patient states he has not had any lorazepam since being released from [facility] 2 days ago. Patient states he has a prescription,but has not  been able to get it filled.  patient c/o abdominal pain and nausea during triage. Patient refused zofran ODT when offered.

## 2018-07-07 ENCOUNTER — Emergency Department (HOSPITAL_COMMUNITY)
Admission: EM | Admit: 2018-07-07 | Discharge: 2018-07-07 | Disposition: A | Discharge status: Home / Self Care | Payer: Medicaid Other | Attending: Emergency Medicine | Admitting: Emergency Medicine

## 2018-07-07 ENCOUNTER — Other Ambulatory Visit: Payer: Self-pay

## 2018-07-07 ENCOUNTER — Emergency Department (HOSPITAL_COMMUNITY): Payer: Medicaid Other

## 2018-07-07 ENCOUNTER — Encounter (HOSPITAL_COMMUNITY): Payer: Self-pay | Admitting: Emergency Medicine

## 2018-07-07 DIAGNOSIS — Z79899 Other long term (current) drug therapy: Secondary | ICD-10-CM | POA: Insufficient documentation

## 2018-07-07 DIAGNOSIS — F419 Anxiety disorder, unspecified: Secondary | ICD-10-CM | POA: Diagnosis present

## 2018-07-07 DIAGNOSIS — Z87891 Personal history of nicotine dependence: Secondary | ICD-10-CM | POA: Insufficient documentation

## 2018-07-07 DIAGNOSIS — I10 Essential (primary) hypertension: Secondary | ICD-10-CM | POA: Insufficient documentation

## 2018-07-07 DIAGNOSIS — F0390 Unspecified dementia without behavioral disturbance: Secondary | ICD-10-CM | POA: Diagnosis not present

## 2018-07-07 LAB — BASIC METABOLIC PANEL
Anion gap: 10 (ref 5–15)
BUN: <5 mg/dL — ABNORMAL LOW (ref 8–23)
CO2: 23 mmol/L (ref 22–32)
Calcium: 8.6 mg/dL — ABNORMAL LOW (ref 8.9–10.3)
Chloride: 98 mmol/L (ref 98–111)
Creatinine, Ser: 0.71 mg/dL (ref 0.61–1.24)
GFR calc Af Amer: >60 mL/min (ref >60)
GFR calc non Af Amer: >60 mL/min (ref >60)
Glucose, Bld: 105 mg/dL — ABNORMAL HIGH (ref 70–99)
Potassium: 3.7 mmol/L (ref 3.5–5.1)
Sodium: 131 mmol/L — ABNORMAL LOW (ref 135–145)

## 2018-07-07 LAB — I-STAT TROPONIN, ED: TROPONIN I, POC: 0.01 ng/mL (ref 0.00–0.08)

## 2018-07-07 LAB — URINALYSIS, ROUTINE W REFLEX MICROSCOPIC
Bacteria, UA: NONE SEEN
Bilirubin Urine: NEGATIVE
Glucose, UA: NEGATIVE mg/dL
Ketones, ur: NEGATIVE mg/dL
Leukocytes, UA: NEGATIVE
Nitrite: NEGATIVE
Protein, ur: NEGATIVE mg/dL
Specific Gravity, Urine: 1.009 (ref 1.005–1.030)
pH: 8 (ref 5.0–8.0)

## 2018-07-07 LAB — CBC
HCT: 40.2 % (ref 39.0–52.0)
Hemoglobin: 13.5 g/dL (ref 13.0–17.0)
MCH: 30.3 pg (ref 26.0–34.0)
MCHC: 33.6 g/dL (ref 30.0–36.0)
MCV: 90.1 fL (ref 80.0–100.0)
Platelets: 289 10*3/uL (ref 150–400)
RBC: 4.46 MIL/uL (ref 4.22–5.81)
RDW: 13.2 % (ref 11.5–15.5)
WBC: 9.6 10*3/uL (ref 4.0–10.5)
nRBC: 0 % (ref 0.0–0.2)

## 2018-07-07 MED ORDER — HYDROXYZINE HCL 25 MG PO TABS
50.0000 mg | ORAL_TABLET | Freq: Once | ORAL | Status: DC
  Filled 2018-07-07: qty 2

## 2018-07-07 MED ORDER — LORAZEPAM 1 MG PO TABS
1.0000 mg | ORAL_TABLET | Freq: Once | ORAL | Status: AC
  Administered 2018-07-07: 1 mg via ORAL
  Filled 2018-07-07: qty 1

## 2018-07-07 NOTE — ED Triage Notes (Signed)
Per EMS: Pt from home with a multitude of complaints. Pt stresses CP and feels extremely anxious.  Pt off anxiety medications x7 days.  Pt normally takes ativan daily.  Pt also sensing impending doom, stating "today is my last day".

## 2018-07-07 NOTE — Discharge Instructions (Addendum)
It is important for you to obtain your medications from the pharmacy and take as prescribed.  Follow-up with your primary care provider.  Follow-up with your specialist this week as scheduled for further appointments.

## 2018-07-07 NOTE — ED Provider Notes (Signed)
[facility] EMERGENCY DEPARTMENT Provider Note   CSN: [id number] Arrival date & time: 07/07/18  1102     History   Chief Complaint No chief complaint on file.   HPI Anthony Lamb is a 63 y.o. male.  63 year old male brought in by EMS for complaint of anxiety with multiple other complaints.  Patient states that he has been out of his Ativan and Ambien for the past 5 days, it feels like his body is jerking and cannot stop moving or hold still.  She reports general body aches, nausea, vomiting (last episode of emesis was last night, nonbloody), states he has a history of colitis and reports his stools to be at baseline.  Patient reports intermittent chest discomfort for the last several days, not having any chest discomfort at this time.  Patient states he takes Ativan 3 times daily for the past several years, is awaiting his prescription to be delivered from the pharmacy.  Patient has run out of medications early despite being in the hospital several days last month with behavioral health admission, states that he occasionally does take more than he is prescribed.  Patient states he has not had any alcohol to drink in the past week either and is trying to take better care of himself.  Patient is scheduled to follow-up with GI next week as he is concerned he has throat cancer due to difficulty swallowing.  Patient denies suicidal or homicidal ideation.  No other complaints or concerns.     Past Medical History:  Diagnosis Date  . Alcohol abuse, in remission   . Anemia   . Ankylosing spondylitis ([place])   . Anxiety   . Chronic diarrhea   . Chronic pain   . Colitis   . Depression   . Esophagitis   . Gastritis   . GERD (gastroesophageal reflux disease)   . Hypertension   . IBS (irritable bowel syndrome)   . Poor dentition   . SIADH (syndrome of inappropriate ADH production) [facility])     Patient Active Problem List   Diagnosis Date Noted  . Major depressive  disorder, recurrent severe without psychotic features ([place]) 06/24/2018  . Alcohol withdrawal ([place]) 02/08/2018  . IBS (irritable bowel syndrome)   . Hypertension   . Colitis   . Adjustment disorder with mixed anxiety and depressed mood 05/11/2017  . Alcohol abuse   . Anxiety   . Gastroesophageal reflux disease   . Osteopenia determined by x-ray 08/06/2014  . Stress fracture of calcaneus 08/06/2014  . Vitamin D deficiency 12/18/2013  . Dementia ([place]) 12/18/2013  . Benign microscopic hematuria 07/06/2013  . SIADH (syndrome of inappropriate ADH production) ([place]) 06/22/2013  . Ankylosing spondylitis ([place]) 06/20/2013  . Malnutrition of moderate degree ([place]) 06/20/2013  . BPH (benign prostatic hyperplasia) 08/22/2010  . History of colonic polyps 08/13/2008  . Essential hypertension 07/03/2007  . PUD (peptic ulcer disease) 07/03/2007    Past Surgical History:  Procedure Laterality Date  . COLONOSCOPY W/ BIOPSIES    . ESOPHAGOGASTRODUODENOSCOPY    . HERNIA REPAIR Bilateral   . OPEN REDUCTION INTERNAL FIXATION (ORIF) DISTAL RADIAL FRACTURE Right 02/11/2018   Procedure: OPEN REDUCTION INTERNAL FIXATION (ORIF) DISTAL RADIAL FRACTURE;  Surgeon: Milly Jakob, MD;  Location: [facility];  Service: Orthopedics;  Laterality: Right;  . TONSILLECTOMY          Home Medications    Prior to Admission medications   Medication Sig Start Date End Date Taking? Authorizing Provider  acetaminophen (  TYLENOL) 325 MG tablet Take 2 tablets (650 mg total) by mouth every 6 (six) hours as needed for mild pain. (May buy over the counter) 06/27/18   Connye Burkitt, NP  gabapentin (NEURONTIN) 100 MG capsule Take 1 capsule (100 mg total) by mouth 2 (two) times daily. 06/29/18   Derrill Center, NP  hydrocortisone (ANUSOL-HC) 2.5 % rectal cream Place rectally 2 (two) times daily as needed for anal itching. 06/27/18   Connye Burkitt, NP  lisinopril (PRINIVIL,ZESTRIL) 10 MG tablet Take 1 tablet (10 mg total) by mouth  daily. Patient taking differently: Take 10 mg by mouth at bedtime.  08/21/17 06/24/18  Lacroce, Hulen Shouts, MD  LORazepam (ATIVAN) 1 MG tablet Take 1 tablet (1 mg total) by mouth every 6 (six) hours as needed (CIWA > 10). 06/27/18   Connye Burkitt, NP  mirtazapine (REMERON) 7.5 MG tablet Take 1 tablet (7.5 mg total) by mouth at bedtime. 06/29/18   Derrill Center, NP  Multiple Vitamin (MULTIVITAMIN WITH MINERALS) TABS tablet Take 1 tablet by mouth daily. (May buy over the counter) 06/28/18   Connye Burkitt, NP  pantoprazole (PROTONIX) 40 MG tablet Take 1 tablet (40 mg total) by mouth daily. 04/24/14   Janith Lima, MD  QUEtiapine (SEROQUEL) 25 MG tablet Take 1 tablet (25 mg total) by mouth at bedtime. For mood 06/27/18   Connye Burkitt, NP  sodium chloride (OCEAN) 0.65 % SOLN nasal spray Place 2 sprays into both nostrils as needed for congestion.    [provider]  sucralfate (CARAFATE) 1 G tablet Take 1 tablet (1 g total) by mouth 4 (four) times daily -  with meals and at bedtime. Chew before swallowing Patient taking differently: Take 1 g by mouth 2 (two) times daily. Chew before swallowing 12/17/13   Janith Lima, MD  traZODone (DESYREL) 50 MG tablet Take 1 tablet (50 mg total) by mouth at bedtime as needed for sleep. 06/27/18   Connye Burkitt, NP  verapamil (CALAN-SR) 240 MG CR tablet Take 240 mg by mouth at bedtime. 02/09/17   [provider]    Family History Family History  Problem Relation Age of Onset  . Anxiety disorder Mother   . Congestive Heart Failure Mother   . Crohn's disease Mother   . Colon cancer Mother 75  . Arthritis Father   . High blood pressure Father     Social History Social History   Tobacco Use  . Smoking status: Former Smoker    Types: Cigarettes    Last attempt to quit: 2014    Years since quitting: 6.0  . Smokeless tobacco: Never Used  Substance Use Topics  . Alcohol use: Not Currently    Comment: none x 1 week  . Drug use: No      Allergies   Diphenhydramine hcl; Flexeril [cyclobenzaprine]; Hctz [hydrochlorothiazide]; Nsaids; and Hydroxyzine   Review of Systems Review of Systems  Constitutional: Negative for chills, diaphoresis and fever.  Respiratory: Negative for shortness of breath.   Cardiovascular: Positive for chest pain.  Gastrointestinal: Positive for diarrhea, nausea and vomiting. Negative for abdominal pain, blood in stool and constipation.  Genitourinary: Negative for difficulty urinating.  Musculoskeletal: Positive for arthralgias and myalgias.  Skin: Negative for rash and wound.  Allergic/Immunologic: Negative for immunocompromised state.  Neurological: Negative for dizziness and weakness.  Hematological: Negative for adenopathy. Does not bruise/bleed easily.  Psychiatric/Behavioral: Positive for sleep disturbance. Negative for confusion and suicidal ideas. The  patient is nervous/anxious.   All other systems reviewed and are negative.    Physical Exam Updated Vital Signs BP (!) 167/83   Pulse 67   Temp 98.4 F (36.9 C) (Oral)   Resp 19   Ht 5' 10"  (1.778 m)   Wt 79.4 kg   SpO2 98%   BMI 25.12 kg/m   Physical Exam Vitals signs and nursing note reviewed.  Constitutional:      General: He is not in acute distress.    Appearance: Normal appearance. He is well-developed. He is not diaphoretic.  HENT:     Head: Normocephalic and atraumatic.     Mouth/Throat:     Mouth: Mucous membranes are moist.  Eyes:     Extraocular Movements: Extraocular movements intact.     Pupils: Pupils are equal, round, and reactive to light.  Cardiovascular:     Rate and Rhythm: Normal rate and regular rhythm.     Pulses: Normal pulses.     Heart sounds: Normal heart sounds. No murmur.  Pulmonary:     Effort: Pulmonary effort is normal.     Breath sounds: Normal breath sounds.  Abdominal:     Tenderness: There is no abdominal tenderness.  Skin:    General: Skin is warm and dry.     Findings: No  erythema or rash.  Neurological:     General: No focal deficit present.     Mental Status: He is alert and oriented to person, place, and time.  Psychiatric:        Attention and Perception: Attention normal.        Mood and Affect: Mood is not anxious or depressed.        Speech: Speech normal.        Behavior: Behavior normal.      ED Treatments / Results  Labs (all labs ordered are listed, but only abnormal results are displayed) Labs Reviewed  BASIC METABOLIC PANEL - Abnormal; Notable for the following components:      Result Value   Sodium 131 (*)    Glucose, Bld 105 (*)    BUN <5 (*)    Calcium 8.6 (*)    All other components within normal limits  URINALYSIS, ROUTINE W REFLEX MICROSCOPIC - Abnormal; Notable for the following components:   Hgb urine dipstick SMALL (*)    All other components within normal limits  CBC  RAPID URINE DRUG SCREEN, HOSP PERFORMED  I-STAT TROPONIN, ED    EKG EKG Interpretation  Date/Time:  Sunday July 07 2018 11:35:31 EST Ventricular Rate:  72 PR Interval:    QRS Duration: 107 QT Interval:  419 QTC Calculation: 459 R Axis:   65 Text Interpretation:  Sinus rhythm Low voltage, precordial leads Confirmed by Dene Gentry [phone]) on 07/07/2018 11:43:33 AM   Radiology Dg Chest Port 1 View  Result Date: 07/07/2018 CLINICAL DATA:  Pt stated the he has been having nasal and sinus drainage and that it has been moving down into his lungs.No feverHx pneumoniaHe stated that he has not been feeling well EXAM: PORTABLE CHEST 1 VIEW COMPARISON:  06/21/2018 FINDINGS: Cardiac silhouette is normal in size. No mediastinal or hilar masses. No evidence of adenopathy. Lungs are mildly hyperexpanded, but clear. No pleural effusion or pneumothorax. Skeletal structures are grossly intact. IMPRESSION: No active disease. Electronically Signed   By: Lajean Manes M.D.   On: 07/07/2018 12:06    Procedures Procedures (including critical care  time)  Medications Ordered in ED  Medications  hydrOXYzine (ATARAX/VISTARIL) tablet 50 mg (50 mg Oral Refused 07/07/18 1203)  LORazepam (ATIVAN) tablet 1 mg (1 mg Oral Given 07/07/18 1319)     Initial Impression / Assessment and Plan / ED Course  I have reviewed the triage vital signs and the nursing notes.  Pertinent labs & imaging results that were available during my care of the patient were reviewed by me and considered in my medical decision making (see chart for details).  Clinical Course as of Jul 08 1331  Sun Jul 07, 7858  2846 63 year old male with history of anxiety presents with complaint of feeling anxious today.  Patient states that he is not been sleeping well, he is unable to sit still and feels like his body is jerking at times.  Patient states he has not had his Ativan for the past 5 days as he has run out early.  Patient's prescription has been filled is awaiting delivery to his house.  Patient is resting comfortably on the bed.  Lab work including CBC, troponin, urinalysis, BMP.  Unchanged compared to baseline.  EKG is unchanged, chest x-ray unremarkable.  Patient is not suicidal or homicidal.  Patient became anxious awaiting his discharge, was given 1 dose of Ativan, he is unable to take Vistaril as this makes him feel restless.  She was advised additional prescription for benzos would not be given from the emergency room and he should pick up his prescriptions and take as prescribed and follow-up with his PCP.   [LM]    Clinical Course User Index [LM] Tacy Learn, PA-C   Final Clinical Impressions(s) / ED Diagnoses   Final diagnoses:  Anxiety    ED Discharge Orders    None       Roque Lias 07/07/18 1333    Valarie Merino, MD 07/08/18 [phone]

## 2018-07-12 ENCOUNTER — Ambulatory Visit (INDEPENDENT_AMBULATORY_CARE_PROVIDER_SITE_OTHER): Payer: Medicaid Other | Admitting: Internal Medicine

## 2018-07-12 ENCOUNTER — Emergency Department (HOSPITAL_COMMUNITY)
Admission: EM | Admit: 2018-07-12 | Discharge: 2018-07-13 | Disposition: A | Discharge status: Other Acute Inpatient Hospital | Payer: Medicaid Other | Attending: Emergency Medicine | Admitting: Emergency Medicine

## 2018-07-12 ENCOUNTER — Encounter

## 2018-07-12 ENCOUNTER — Encounter: Payer: Self-pay | Admitting: Internal Medicine

## 2018-07-12 ENCOUNTER — Encounter (HOSPITAL_COMMUNITY): Payer: Self-pay | Admitting: *Deleted

## 2018-07-12 VITALS — BP 122/70 | HR 72 | Ht 67.5 in | Wt 178.1 lb

## 2018-07-12 DIAGNOSIS — K51511 Left sided colitis with rectal bleeding: Secondary | ICD-10-CM

## 2018-07-12 DIAGNOSIS — Z86010 Personal history of colonic polyps: Secondary | ICD-10-CM | POA: Diagnosis not present

## 2018-07-12 DIAGNOSIS — R1310 Dysphagia, unspecified: Secondary | ICD-10-CM

## 2018-07-12 DIAGNOSIS — Z79899 Other long term (current) drug therapy: Secondary | ICD-10-CM | POA: Diagnosis not present

## 2018-07-12 DIAGNOSIS — Z789 Other specified health status: Secondary | ICD-10-CM

## 2018-07-12 DIAGNOSIS — Z860101 Personal history of adenomatous and serrated colon polyps: Secondary | ICD-10-CM

## 2018-07-12 DIAGNOSIS — F419 Anxiety disorder, unspecified: Secondary | ICD-10-CM | POA: Insufficient documentation

## 2018-07-12 DIAGNOSIS — F329 Major depressive disorder, single episode, unspecified: Secondary | ICD-10-CM | POA: Diagnosis not present

## 2018-07-12 DIAGNOSIS — Z008 Encounter for other general examination: Secondary | ICD-10-CM

## 2018-07-12 DIAGNOSIS — R45851 Suicidal ideations: Secondary | ICD-10-CM | POA: Insufficient documentation

## 2018-07-12 DIAGNOSIS — K6289 Other specified diseases of anus and rectum: Secondary | ICD-10-CM

## 2018-07-12 DIAGNOSIS — Z87891 Personal history of nicotine dependence: Secondary | ICD-10-CM | POA: Insufficient documentation

## 2018-07-12 DIAGNOSIS — Z7289 Other problems related to lifestyle: Secondary | ICD-10-CM | POA: Diagnosis not present

## 2018-07-12 LAB — ETHANOL: Alcohol, Ethyl (B): <10 mg/dL (ref <10)

## 2018-07-12 LAB — COMPREHENSIVE METABOLIC PANEL
ALT: 17 U/L (ref 0–44)
ANION GAP: 11 (ref 5–15)
AST: 18 U/L (ref 15–41)
Albumin: 3.6 g/dL (ref 3.5–5.0)
Alkaline Phosphatase: 68 U/L (ref 38–126)
BUN: <5 mg/dL — ABNORMAL LOW (ref 8–23)
CO2: 24 mmol/L (ref 22–32)
Calcium: 8.8 mg/dL — ABNORMAL LOW (ref 8.9–10.3)
Chloride: 96 mmol/L — ABNORMAL LOW (ref 98–111)
Creatinine, Ser: 0.79 mg/dL (ref 0.61–1.24)
GFR calc Af Amer: >60 mL/min (ref >60)
GFR calc non Af Amer: >60 mL/min (ref >60)
Glucose, Bld: 107 mg/dL — ABNORMAL HIGH (ref 70–99)
Potassium: 3.7 mmol/L (ref 3.5–5.1)
Sodium: 131 mmol/L — ABNORMAL LOW (ref 135–145)
TOTAL PROTEIN: 6.6 g/dL (ref 6.5–8.1)
Total Bilirubin: 0.6 mg/dL (ref 0.3–1.2)

## 2018-07-12 LAB — CBC WITH DIFFERENTIAL/PLATELET
Abs Immature Granulocytes: 0.03 10*3/uL (ref 0.00–0.07)
BASOS ABS: 0 10*3/uL (ref 0.0–0.1)
Basophils Relative: 0 %
Eosinophils Absolute: 0 10*3/uL (ref 0.0–0.5)
Eosinophils Relative: 0 %
HCT: 39.8 % (ref 39.0–52.0)
Hemoglobin: 13.1 g/dL (ref 13.0–17.0)
Immature Granulocytes: 0 %
Lymphocytes Relative: 15 %
Lymphs Abs: 1.5 10*3/uL (ref 0.7–4.0)
MCH: 29.5 pg (ref 26.0–34.0)
MCHC: 32.9 g/dL (ref 30.0–36.0)
MCV: 89.6 fL (ref 80.0–100.0)
Monocytes Absolute: 0.7 10*3/uL (ref 0.1–1.0)
Monocytes Relative: 7 %
NRBC: 0 % (ref 0.0–0.2)
Neutro Abs: 8.1 10*3/uL — ABNORMAL HIGH (ref 1.7–7.7)
Neutrophils Relative %: 78 %
Platelets: 338 10*3/uL (ref 150–400)
RBC: 4.44 MIL/uL (ref 4.22–5.81)
RDW: 13.2 % (ref 11.5–15.5)
WBC: 10.4 10*3/uL (ref 4.0–10.5)

## 2018-07-12 LAB — RAPID URINE DRUG SCREEN, HOSP PERFORMED
Amphetamines: NOT DETECTED
Barbiturates: NOT DETECTED
Benzodiazepines: NOT DETECTED
Cocaine: NOT DETECTED
Opiates: NOT DETECTED
Tetrahydrocannabinol: NOT DETECTED

## 2018-07-12 LAB — ACETAMINOPHEN LEVEL

## 2018-07-12 LAB — SALICYLATE LEVEL: Salicylate Lvl: <7 mg/dL (ref 2.8–30.0)

## 2018-07-12 MED ORDER — TRAZODONE HCL 50 MG PO TABS
50.0000 mg | ORAL_TABLET | Freq: Every evening | ORAL | Status: DC | PRN
  Administered 2018-07-12: 50 mg via ORAL
  Filled 2018-07-12: qty 1

## 2018-07-12 MED ORDER — VITAMIN B-1 100 MG PO TABS
100.0000 mg | ORAL_TABLET | Freq: Every day | ORAL | Status: DC
  Administered 2018-07-12 – 2018-07-13 (×2): 100 mg via ORAL
  Filled 2018-07-12 (×2): qty 1

## 2018-07-12 MED ORDER — LORAZEPAM 2 MG/ML IJ SOLN: 0.0000 mg | Freq: Two times a day (BID) | INTRAMUSCULAR | Status: DC

## 2018-07-12 MED ORDER — QUETIAPINE FUMARATE 25 MG PO TABS
25.0000 mg | ORAL_TABLET | Freq: Every day | ORAL | Status: DC
  Administered 2018-07-13: 25 mg via ORAL
  Filled 2018-07-12: qty 1

## 2018-07-12 MED ORDER — ACETAMINOPHEN 325 MG PO TABS
650.0000 mg | ORAL_TABLET | ORAL | Status: DC | PRN
  Administered 2018-07-12 – 2018-07-13 (×2): 650 mg via ORAL
  Filled 2018-07-12 (×2): qty 2

## 2018-07-12 MED ORDER — LORAZEPAM 2 MG/ML IJ SOLN: 0.0000 mg | Freq: Four times a day (QID) | INTRAMUSCULAR | Status: DC

## 2018-07-12 MED ORDER — SUCRALFATE 1 G PO TABS
1.0000 g | ORAL_TABLET | Freq: Three times a day (TID) | ORAL | Status: DC
  Administered 2018-07-12 – 2018-07-13 (×2): 1 g via ORAL
  Filled 2018-07-12 (×2): qty 1

## 2018-07-12 MED ORDER — LORAZEPAM 1 MG PO TABS
0.0000 mg | ORAL_TABLET | Freq: Four times a day (QID) | ORAL | Status: DC
  Administered 2018-07-12: 2 mg via ORAL
  Administered 2018-07-13: 1 mg via ORAL
  Administered 2018-07-13: 2 mg via ORAL
  Filled 2018-07-12: qty 1
  Filled 2018-07-12 (×2): qty 2

## 2018-07-12 MED ORDER — LISINOPRIL 10 MG PO TABS
10.0000 mg | ORAL_TABLET | Freq: Every day | ORAL | Status: DC
  Administered 2018-07-12: 10 mg via ORAL
  Filled 2018-07-12: qty 1

## 2018-07-12 MED ORDER — LORAZEPAM 1 MG PO TABS: 0.0000 mg | ORAL_TABLET | Freq: Two times a day (BID) | ORAL | Status: DC

## 2018-07-12 MED ORDER — ADULT MULTIVITAMIN W/MINERALS CH
1.0000 | ORAL_TABLET | Freq: Every day | ORAL | Status: DC
  Administered 2018-07-12 – 2018-07-13 (×2): 1 via ORAL
  Filled 2018-07-12 (×2): qty 1

## 2018-07-12 MED ORDER — ONDANSETRON HCL 4 MG PO TABS: 4.0000 mg | ORAL_TABLET | Freq: Three times a day (TID) | ORAL | Status: DC | PRN

## 2018-07-12 MED ORDER — VERAPAMIL HCL ER 240 MG PO TBCR
240.0000 mg | EXTENDED_RELEASE_TABLET | Freq: Every day | ORAL | Status: DC
  Administered 2018-07-12: 240 mg via ORAL
  Filled 2018-07-12: qty 1

## 2018-07-12 MED ORDER — THIAMINE HCL 100 MG/ML IJ SOLN: 100.0000 mg | Freq: Every day | INTRAMUSCULAR | Status: DC

## 2018-07-12 MED ORDER — LORAZEPAM 1 MG PO TABS
1.0000 mg | ORAL_TABLET | Freq: Once | ORAL | Status: AC
  Administered 2018-07-12: 1 mg via ORAL
  Filled 2018-07-12: qty 1

## 2018-07-12 MED ORDER — PANTOPRAZOLE SODIUM 40 MG PO TBEC
40.0000 mg | DELAYED_RELEASE_TABLET | Freq: Every day | ORAL | Status: DC
  Administered 2018-07-12 – 2018-07-13 (×2): 40 mg via ORAL
  Filled 2018-07-12 (×2): qty 1

## 2018-07-12 MED ORDER — ZOLPIDEM TARTRATE 5 MG PO TABS
5.0000 mg | ORAL_TABLET | Freq: Every evening | ORAL | Status: DC | PRN
  Administered 2018-07-12: 5 mg via ORAL
  Filled 2018-07-12: qty 1

## 2018-07-12 MED ORDER — ALUM & MAG HYDROXIDE-SIMETH 200-200-20 MG/5ML PO SUSP: 30.0000 mL | Freq: Four times a day (QID) | ORAL | Status: DC | PRN

## 2018-07-12 NOTE — BH Assessment (Addendum)
Call was made to CST worker [place] for collateral information, but there was not an answer.  A HIPPA compliant message was left on the voice mail.

## 2018-07-12 NOTE — Discharge Instructions (Signed)
See your regular doctor for ongoing management of your sleep and anxiety issues. Take your usual home medications as they are prescribed, do not take too many of them. Follow up with your regular doctor for ongoing management of your health and mental health. Return to the ER for emergent changes or worsening symptoms.

## 2018-07-12 NOTE — ED Notes (Signed)
Pt noted to be seated on the side of the bed, eating at this time.

## 2018-07-12 NOTE — ED Notes (Signed)
[facility] staff member [place] called and advised that Anthony Lamb will be accepted to [facility] at 10 am on 07/13/18.  Accepting physician: Dr. Kathlene Cote  RN report to [phone]

## 2018-07-12 NOTE — Patient Instructions (Signed)
  We will contact you and Kennyth Lose with information about setting you up for an EGD and colonoscopy.   I appreciate the opportunity to care for you. Silvano Rusk, MD, [facility]

## 2018-07-12 NOTE — Progress Notes (Signed)
Anthony Lamb 63 y.o. October 03, 1955 [id number]  Assessment & Plan:   Encounter Diagnoses  Name Primary?  . Odynophagia Yes  . Dysphagia, unspecified type   . Left sided ulcerative colitis with rectal bleeding ([place])   . Rectal pain   . Hx of adenomatous colonic polyps     He needs an EGD and a colonoscopy to evaluate these problems.  He is not going to be able to prep independently.  He will need an observation admission to the hospital and procedures the subsequent day.  I cannot arrange that today but will work on that.  We will contact his CST worker, Kennyth Lose.  She will make sure he gets to the hospital.  At the time of this dictation I see that he went to the emergency department after my visit with him today. It turns out he has been abusing Ativan and Lunesta.  He is used these up in a shorter period of time than should be.  He has been drinking to 25 ounce beers at night and he had a suicidal gesture today. He is not thought to meet inpatient psychiatric criteria per the psychiatric nurse practitioner note that I see from today.  His CBC was normal sodium 131 chloride 96 otherwise negative CMET, ethanol level is negative rapid urine drug screen was negative CC: Sandi Mariscal, MD   Subjective:   Chief Complaint: Multiple including odynophagia dysphagia rectal bleeding and rectal pain  HPI The patient is here with his caseworker, with multiple gastrointestinal complaints.  These include odynophagia and dysphagia to solids and liquids, weight loss though review of the chart shows that his weight is stable for at least a year, rectal pain and bleeding.  He has a history of left-sided ulcerative colitis adenomatous colon polyps and a family history of colon cancer in his father.  I have not seen him in 10 years.  He used to come with his mother, she has passed away and the patient is living on his own but given comorbidities that include anxiety and depression and a history of alcohol  abuse he requires support to try to maintain independent living.  Per his caseworker this is difficult and he is not compliant with his medications on a regular basis either.  In 2010 he had similar complaints of odynophagia and dysphagia, I thought that it was in part related to poor and missing dentition, he is completely edentulous now, but he had a normal EGD.  He had an colonoscopy in 2010 that showed mainly proctitis and otherwise normal with excellent prep except for external hemorrhoids, he does have a personal history of tubulovillous adenomas removed in 2006.  He was in the emergency room in January of this year and a CT of the abdomen and pelvis other than a fat-containing umbilical hernia, atherosclerosis, changes of ankylosing spondylitis, diverticulosis with the only abnormalities.  He has been in and out of the ER some with some hyponatremia, but otherwise unremarkable lab testing.  He says he feels weak and dehydrated.  His appetite is poor at times and he says he is not been eating well the last few days.  Wt Readings from Last 3 Encounters:  07/12/18 178 lb 2 oz (80.8 kg)  07/07/18 175 lb 0.7 oz (79.4 kg)  07/02/18 175 lb (79.4 kg)    Allergies  Allergen Reactions  . Diphenhydramine Hcl Other (See Comments)    Restlessness  . Flexeril [Cyclobenzaprine] Other (See Comments)    Restlessness  .  Hctz [Hydrochlorothiazide] Other (See Comments)    Caused to lose sodium when taking with Lisinopril  . Nsaids Other (See Comments)    GI upset  . Gabapentin Diarrhea  . Hydroxyzine Other (See Comments)    Restlessness   Current Meds  Medication Sig  . acetaminophen (TYLENOL) 325 MG tablet Take 2 tablets (650 mg total) by mouth every 6 (six) hours as needed for mild pain. (May buy over the counter)  . Eszopiclone (ESZOPICLONE) 3 MG TABS Take 3 mg by mouth at bedtime. Take immediately before bedtime  . lisinopril (PRINIVIL,ZESTRIL) 10 MG tablet Take 1 tablet (10 mg total) by mouth  daily. (Patient taking differently: Take 10 mg by mouth at bedtime. )  . LORazepam (ATIVAN) 1 MG tablet Take 1 tablet (1 mg total) by mouth every 6 (six) hours as needed (CIWA > 10).  . Multiple Vitamin (MULTIVITAMIN WITH MINERALS) TABS tablet Take 1 tablet by mouth daily. (May buy over the counter)  . pantoprazole (PROTONIX) 40 MG tablet Take 1 tablet (40 mg total) by mouth daily.  . QUEtiapine (SEROQUEL) 25 MG tablet Take 1 tablet (25 mg total) by mouth at bedtime. For mood  . sodium chloride (OCEAN) 0.65 % SOLN nasal spray Place 2 sprays into both nostrils as needed for congestion.  . sucralfate (CARAFATE) 1 G tablet Take 1 tablet (1 g total) by mouth 4 (four) times daily -  with meals and at bedtime. Chew before swallowing (Patient taking differently: Take 1 g by mouth 2 (two) times daily. Chew before swallowing)  . traZODone (DESYREL) 50 MG tablet Take 1 tablet (50 mg total) by mouth at bedtime as needed for sleep.  . verapamil (CALAN-SR) 240 MG CR tablet Take 240 mg by mouth at bedtime.   Past Medical History:  Diagnosis Date  . Alcohol abuse, in remission   . Anal fissure   . Anemia   . Ankylosing spondylitis ([place])   . Anxiety   . Arthritis   . Chronic diarrhea   . Chronic headaches   . Chronic pain   . Colitis   . Colon polyps   . Depression   . Esophagitis   . Gastritis   . GERD (gastroesophageal reflux disease)   . Hypertension   . IBS (irritable bowel syndrome)   . Poor dentition   . SIADH (syndrome of inappropriate ADH production) ([place])    Past Surgical History:  Procedure Laterality Date  . COLONOSCOPY W/ BIOPSIES    . ESOPHAGOGASTRODUODENOSCOPY    . HERNIA REPAIR Bilateral   . OPEN REDUCTION INTERNAL FIXATION (ORIF) DISTAL RADIAL FRACTURE Right 02/11/2018   Procedure: OPEN REDUCTION INTERNAL FIXATION (ORIF) DISTAL RADIAL FRACTURE;  Surgeon: Milly Jakob, MD;  Location: [facility];  Service: Orthopedics;  Laterality: Right;  . TONSILLECTOMY     Social History    Social History Narrative   HSG. Long - term disability - unable to work. Lived with his mother in her house - she died 2023-04-27 - he is bankrupt/destitute and soon will loose his home. He has not worked enough quarters to be eligible for [employer]. He is advised to seek assistance for health care either with Health Insurance exchange of [employer].   family history includes Anxiety disorder in his mother; Arthritis in his father; Colon cancer (age of onset: 29) in his mother; Congestive Heart Failure in his mother; Crohn's disease in his father and mother; High blood pressure in his father.   Review of Systems As per HPI.  Anxiety and depression noncompliance issues.  Insomnia.  Chronic spine pain.  All other review of systems negative.  Objective:   Physical Exam @BP  122/70 (BP Location: Left Arm, Patient Position: Sitting, Cuff Size: Normal)   Pulse 72   Ht 5' 7.5" (1.715 m) Comment: height easured without shoes  Wt 178 lb 2 oz (80.8 kg)   BMI 27.49 kg/m @  General:  Well-developed, well-nourished and in no acute distress Eyes:  anicteric. ENT:   Mouth and posterior pharynx free of lesions. EDENTULOUS Neck:   supple w/o thyromegaly or mass.  Lungs: Clear to auscultation bilaterally. Heart:  S1S2, no rubs, murmurs, gallops. Abdomen:  soft, non-tender, no hepatosplenomegaly, hernia, or mass and BS+.  Rectal: NL perianal exam, tender, tan stool, no mass Lymph:  no cervical or supraclavicular adenopathy. Extremities:   no edema, cyanosis or clubbing Skin   no rash. Neuro:  A&O x 3.  Psych:  Flat affect Spine is fixed kyphotic   Data Reviewed: See HPI

## 2018-07-12 NOTE — ED Provider Notes (Addendum)
[facility] EMERGENCY DEPARTMENT Provider Note   CSN: [id number] Arrival date & time: 07/12/18  1225     History   Chief Complaint Chief Complaint  Patient presents with  . Suicidal    HPI Anthony Lamb is a 63 y.o. male with a PMHx of depression, SIADH, ankylosing spondylitis, anemia, alcohol abuse, chronic diarrhea, headaches, chronic pain, colitis, GERD, HTN, IBS, and other conditions listed below, who presents to the ED with complaints of "I do not want to live anymore".  Patient states that he has a lot of anxiety and issues sleeping, and he is sick of it and having suicidal ideations with a plan to cut his wrists.  He states that his caretakers have not been giving him his anxiety medication and this is making his anxiety worse.  He states that he has a prescription for lorazepam in [place] but has not gotten it yet.  He reports that he has Lunesta 3 mg that he supposed to take at bedtime, but he has run out despite this prescription being filled on 07/07/2018.  He states that the step-by-step people have been taking some from him, but he is also taking more than he is prescribed, taking about 3 to 4 pills per night.  He took 4 pills last night and attempt to sleep, did not take them at the same time, he took them over the course of the night because he could not sleep.  He has also been drinking alcohol every night, having about 50 ounces of beer per night, last drink was last night.  He denies HI, AVH, illicit drug use, or tobacco use.  He is compliant with all of his other medications, which include verapamil, lisinopril, sucralfate, and Protonix.  He has no acute medical complaints at this time and is here voluntarily.  Of note, he recently had a [facility] admission at the end of January 2020.  He has been seen in the ED for anxiety twice since then, last visit was on 07/07/18.   The history is provided by the patient and medical records. No language interpreter was used.     Past Medical History:  Diagnosis Date  . Alcohol abuse, in remission   . Anal fissure   . Anemia   . Ankylosing spondylitis ([place])   . Anxiety   . Arthritis   . Chronic diarrhea   . Chronic headaches   . Chronic pain   . Colitis   . Colon polyps   . Depression   . Esophagitis   . Gastritis   . GERD (gastroesophageal reflux disease)   . Hypertension   . IBS (irritable bowel syndrome)   . Poor dentition   . SIADH (syndrome of inappropriate ADH production) [facility])     Patient Active Problem List   Diagnosis Date Noted  . Major depressive disorder, recurrent severe without psychotic features ([place]) 06/24/2018  . Alcohol withdrawal ([place]) 02/08/2018  . IBS (irritable bowel syndrome)   . Hypertension   . Colitis   . Adjustment disorder with mixed anxiety and depressed mood 05/11/2017  . Alcohol abuse   . Anxiety   . Gastroesophageal reflux disease   . Osteopenia determined by x-ray 08/06/2014  . Stress fracture of calcaneus 08/06/2014  . Vitamin D deficiency 12/18/2013  . Dementia ([place]) 12/18/2013  . Benign microscopic hematuria 07/06/2013  . SIADH (syndrome of inappropriate ADH production) ([place]) 06/22/2013  . Ankylosing spondylitis ([place]) 06/20/2013  . Malnutrition of moderate degree ([place]) 06/20/2013  .  BPH (benign prostatic hyperplasia) 08/22/2010  . History of colonic polyps 08/13/2008  . Essential hypertension 07/03/2007  . PUD (peptic ulcer disease) 07/03/2007    Past Surgical History:  Procedure Laterality Date  . COLONOSCOPY W/ BIOPSIES    . ESOPHAGOGASTRODUODENOSCOPY    . HERNIA REPAIR Bilateral   . OPEN REDUCTION INTERNAL FIXATION (ORIF) DISTAL RADIAL FRACTURE Right 02/11/2018   Procedure: OPEN REDUCTION INTERNAL FIXATION (ORIF) DISTAL RADIAL FRACTURE;  Surgeon: Milly Jakob, MD;  Location: [facility];  Service: Orthopedics;  Laterality: Right;  . TONSILLECTOMY          Home Medications    Prior to Admission medications   Medication Sig Start Date End  Date Taking? Authorizing Provider  acetaminophen (TYLENOL) 325 MG tablet Take 2 tablets (650 mg total) by mouth every 6 (six) hours as needed for mild pain. (May buy over the counter) 06/27/18   Connye Burkitt, NP  Eszopiclone (ESZOPICLONE) 3 MG TABS Take 3 mg by mouth at bedtime. Take immediately before bedtime    [provider]  lisinopril (PRINIVIL,ZESTRIL) 10 MG tablet Take 1 tablet (10 mg total) by mouth daily. Patient taking differently: Take 10 mg by mouth at bedtime.  08/21/17 07/12/18  Lacroce, Hulen Shouts, MD  LORazepam (ATIVAN) 1 MG tablet Take 1 tablet (1 mg total) by mouth every 6 (six) hours as needed (CIWA > 10). 06/27/18   Connye Burkitt, NP  mirtazapine (REMERON) 7.5 MG tablet Take 1 tablet (7.5 mg total) by mouth at bedtime. Patient not taking: Reported on 07/12/2018 06/29/18   Derrill Center, NP  Multiple Vitamin (MULTIVITAMIN WITH MINERALS) TABS tablet Take 1 tablet by mouth daily. (May buy over the counter) 06/28/18   Connye Burkitt, NP  pantoprazole (PROTONIX) 40 MG tablet Take 1 tablet (40 mg total) by mouth daily. 04/24/14   Janith Lima, MD  QUEtiapine (SEROQUEL) 25 MG tablet Take 1 tablet (25 mg total) by mouth at bedtime. For mood 06/27/18   Connye Burkitt, NP  sodium chloride (OCEAN) 0.65 % SOLN nasal spray Place 2 sprays into both nostrils as needed for congestion.    [provider]  sucralfate (CARAFATE) 1 G tablet Take 1 tablet (1 g total) by mouth 4 (four) times daily -  with meals and at bedtime. Chew before swallowing Patient taking differently: Take 1 g by mouth 2 (two) times daily. Chew before swallowing 12/17/13   Janith Lima, MD  traZODone (DESYREL) 50 MG tablet Take 1 tablet (50 mg total) by mouth at bedtime as needed for sleep. 06/27/18   Connye Burkitt, NP  verapamil (CALAN-SR) 240 MG CR tablet Take 240 mg by mouth at bedtime. 02/09/17   [provider]    Family History Family History  Problem Relation Age of Onset  . Anxiety  disorder Mother   . Congestive Heart Failure Mother   . Crohn's disease Mother   . Colon cancer Mother 22  . Arthritis Father   . High blood pressure Father   . Crohn's disease Father     Social History Social History   Tobacco Use  . Smoking status: Former Smoker    Types: Cigarettes    Last attempt to quit: 2014    Years since quitting: 6.1  . Smokeless tobacco: Never Used  Substance Use Topics  . Alcohol use: Yes    Comment: 1 25 oz of beer a night  . Drug use: No     Allergies  Diphenhydramine hcl; Flexeril [cyclobenzaprine]; Hctz [hydrochlorothiazide]; Nsaids; Gabapentin; and Hydroxyzine   Review of Systems Review of Systems  Constitutional: Negative for chills and fever.  Respiratory: Negative for shortness of breath.   Cardiovascular: Negative for chest pain.  Gastrointestinal: Negative for abdominal pain, constipation, diarrhea, nausea and vomiting.  Genitourinary: Negative for dysuria and hematuria.  Musculoskeletal: Negative for arthralgias and myalgias.  Skin: Negative for color change.  Allergic/Immunologic: Negative for immunocompromised state.  Neurological: Negative for weakness and numbness.  Psychiatric/Behavioral: Positive for sleep disturbance and suicidal ideas. Negative for confusion and hallucinations. The patient is nervous/anxious.    All other systems reviewed and are negative for acute change except as noted in the HPI.    Physical Exam Updated Vital Signs BP (!) 155/83   Pulse 66   Temp 97.9 F (36.6 C) (Oral)   Resp 13   SpO2 99%   Physical Exam Vitals signs and nursing note reviewed.  Constitutional:      General: He is not in acute distress.    Appearance: Normal appearance. He is well-developed. He is not toxic-appearing.     Comments: Afebrile, nontoxic, NAD  HENT:     Head: Normocephalic and atraumatic.  Eyes:     General:        Right eye: No discharge.        Left eye: No discharge.     Conjunctiva/sclera:  Conjunctivae normal.  Neck:     Musculoskeletal: Normal range of motion and neck supple.  Cardiovascular:     Rate and Rhythm: Normal rate and regular rhythm.     Pulses: Normal pulses.     Heart sounds: Normal heart sounds, S1 normal and S2 normal. No murmur. No friction rub. No gallop.   Pulmonary:     Effort: Pulmonary effort is normal. No respiratory distress.     Breath sounds: Normal breath sounds. No decreased breath sounds, wheezing, rhonchi or rales.  Abdominal:     General: Bowel sounds are normal. There is no distension.     Palpations: Abdomen is soft. Abdomen is not rigid.     Tenderness: There is no abdominal tenderness. There is no right CVA tenderness, left CVA tenderness, guarding or rebound. Negative signs include Murphy's sign and McBurney's sign.  Musculoskeletal: Normal range of motion.  Skin:    General: Skin is warm and dry.     Findings: No rash.  Neurological:     Mental Status: He is alert and oriented to person, place, and time.     Sensory: Sensation is intact. No sensory deficit.     Motor: Motor function is intact.  Psychiatric:        Attention and Perception: He does not perceive auditory or visual hallucinations.        Mood and Affect: Affect normal. Mood is anxious and depressed.        Behavior: Behavior normal.        Thought Content: Thought content includes suicidal ideation. Thought content does not include homicidal ideation. Thought content includes suicidal plan. Thought content does not include homicidal plan.     Comments: Depressed and anxious mood, but pleasant and cooperative. Endorsing SI with a plan, denies HI or AVH, doesn't seem to be responding to internal stimuli.       ED Treatments / Results  Labs (all labs ordered are listed, but only abnormal results are displayed) Labs Reviewed  CBC WITH DIFFERENTIAL/PLATELET - Abnormal; Notable for the following components:  Result Value   Neutro Abs 8.1 (*)    All other components  within normal limits  COMPREHENSIVE METABOLIC PANEL - Abnormal; Notable for the following components:   Sodium 131 (*)    Chloride 96 (*)    Glucose, Bld 107 (*)    BUN <5 (*)    Calcium 8.8 (*)    All other components within normal limits  ACETAMINOPHEN LEVEL - Abnormal; Notable for the following components:   Acetaminophen (Tylenol), Serum <10 (*)    All other components within normal limits  ETHANOL  SALICYLATE LEVEL  RAPID URINE DRUG SCREEN, HOSP PERFORMED    EKG None  Radiology No results found.  Procedures Procedures (including critical care time)  Medications Ordered in ED Medications  lisinopril (PRINIVIL,ZESTRIL) tablet 10 mg (has no administration in time range)  multivitamin with minerals tablet 1 tablet (has no administration in time range)  pantoprazole (PROTONIX) EC tablet 40 mg (has no administration in time range)  QUEtiapine (SEROQUEL) tablet 25 mg (has no administration in time range)  sucralfate (CARAFATE) tablet 1 g (has no administration in time range)  traZODone (DESYREL) tablet 50 mg (has no administration in time range)  verapamil (CALAN-SR) CR tablet 240 mg (has no administration in time range)  LORazepam (ATIVAN) injection 0-4 mg (has no administration in time range)    Or  LORazepam (ATIVAN) tablet 0-4 mg (has no administration in time range)  LORazepam (ATIVAN) injection 0-4 mg (has no administration in time range)    Or  LORazepam (ATIVAN) tablet 0-4 mg (has no administration in time range)  thiamine (VITAMIN B-1) tablet 100 mg (has no administration in time range)    Or  thiamine (B-1) injection 100 mg (has no administration in time range)  acetaminophen (TYLENOL) tablet 650 mg (has no administration in time range)  zolpidem (AMBIEN) tablet 5 mg (has no administration in time range)  ondansetron (ZOFRAN) tablet 4 mg (has no administration in time range)  alum & mag hydroxide-simeth (MAALOX/MYLANTA) 200-200-20 MG/5ML suspension 30 mL (has no  administration in time range)  LORazepam (ATIVAN) tablet 1 mg (1 mg Oral Given 07/12/18 1315)     Initial Impression / Assessment and Plan / ED Course  I have reviewed the triage vital signs and the nursing notes.  Pertinent labs & imaging results that were available during my care of the patient were reviewed by me and considered in my medical decision making (see chart for details).     63 y.o. male here with suicidal ideations with a plan to cut his wrists, as well as anxiety and issues with his insomnia.  He has been drinking alcohol recently because he cannot sleep.  He has been taking more Lunesta than he is prescribed, taking several doses per night.  He denies HI, AVH, illicit drug use, or tobacco use.  He is compliant with all of his other medications.  He states he has been out of his lorazepam but has a prescription in [place] that he needs to pick up.  On exam, patient very anxious, depressed mood/affect, but otherwise physical exam is benign.  We will get psych clearance labs and reassess shortly.  3:04 PM CBC w/diff WNL. CMP with stable hyponatremia 131 and otherwise fairly unremarkable. EtOH level undetectable. Salicylate and acetaminophen levels WNL.  UDS pending, but does not interfere with med clearance. Pt medically cleared at this time. Psych hold orders and home med orders placed. CIWA orders placed. Please see TTS notes for further documentation  of care/dispo. PLEASE NOTE THAT PT IS HERE VOLUNTARILY AT THIS TIME, IF PT TRIES TO LEAVE THEY WOULD NEED IVC PAPERWORK TAKEN OUT. Pt stable at time of med clearance.     Final Clinical Impressions(s) / ED Diagnoses   Final diagnoses:  Suicidal ideation  Anxiety  Alcohol use  Medical clearance for psychiatric admission    ED Discharge Orders    [address] [place], [place] 07/12/18 1504  ADDENDUM 3:28 PM: Marvia Pickles NP for psych calling, states pt told them he is no longer suicidal and is cleared by them,  however when I speak with the pt he tells me he is suicidal. Spoke with Marvia Pickles NP again and he states that then the pt will be placed inpatient. Please see his notes for further documentation of care/dispo. Pt stable.      [address] [place], [place] 07/12/18 1534    Lacretia Leigh, MD 07/13/18 1444

## 2018-07-12 NOTE — BH Assessment (Addendum)
Assessment Note  Anthony Lamb is an 63 y.o. male.  The pt came in with his CST worker due to SI and anxiety.  The pt stated he has a plan to kill himself by cutting himself with a knife.  He stated he is upset about not having any family and not getting any sleep..  The pt is abusing Ativan and Lunesta.  He received a 30 days supply on Lunesta Monday and he is currently out of the medication.  The pt has run out of his Ativan early also.  The pt admits to drinking 2 25 oz beers at night.  He stated he is getting his medication from Dr. Nancy Fetter.  He denies any suicide attempts in the past.  He made a suicidal gesture about 3 weeks ago, when he put a knife to his arm and his CST worker removed the knife.  The pt denies cutting himself then or in the past.  He is going to [facility] and is with CST.  The pt denies seeing a psychiatrist with [facility].  He was last inpatient January 2020.  The pt lives alone.  According to a previous note, the pt doesn't have any money and is in danger of losing his home.  The pt initially stated he is suicidal, but later told the [facility] NP that he isn't suicidal.  The pt then changed again and stated he is suicidal.   The pt denies HI, legal issues, and history of abuse.  The pt stated he sees spots.  He is sleeping about 2 hours a night and has a poor appetite.  He reports feeling hopeless, feeling bad about himself and increased crying spells.  The pt denies any other drug use.  His blood alcohol level is 0.  Pt is dressed in scrubs. He is alert and oriented x4. Pt speaks in a clear tone, at moderate volume and normal pace. Eye contact is good. Pt's mood is depressed. Thought process is coherent and relevant. There is no indication Pt is currently responding to internal stimuli or experiencing delusional thought content.?Pt was cooperative throughout assessment.      Diagnosis: F33.2 Major depressive disorder, Recurrent episode, Severe  Past Medical History:  Past  Medical History:  Diagnosis Date  . Alcohol abuse, in remission   . Anal fissure   . Anemia   . Ankylosing spondylitis ([place])   . Anxiety   . Arthritis   . Chronic diarrhea   . Chronic headaches   . Chronic pain   . Colitis   . Colon polyps   . Depression   . Esophagitis   . Gastritis   . GERD (gastroesophageal reflux disease)   . Hypertension   . IBS (irritable bowel syndrome)   . Poor dentition   . SIADH (syndrome of inappropriate ADH production) ([place])     Past Surgical History:  Procedure Laterality Date  . COLONOSCOPY W/ BIOPSIES    . ESOPHAGOGASTRODUODENOSCOPY    . HERNIA REPAIR Bilateral   . OPEN REDUCTION INTERNAL FIXATION (ORIF) DISTAL RADIAL FRACTURE Right 02/11/2018   Procedure: OPEN REDUCTION INTERNAL FIXATION (ORIF) DISTAL RADIAL FRACTURE;  Surgeon: Milly Jakob, MD;  Location: [place];  Service: Orthopedics;  Laterality: Right;  . TONSILLECTOMY      Family History:  Family History  Problem Relation Age of Onset  . Anxiety disorder Mother   . Congestive Heart Failure Mother   . Crohn's disease Mother   . Colon cancer Mother 37  .  Arthritis Father   . High blood pressure Father   . Crohn's disease Father     Social History:  reports that he quit smoking about 6 years ago. His smoking use included cigarettes. He has never used smokeless tobacco. He reports current alcohol use. He reports that he does not use drugs.  Additional Social History:  Alcohol / Drug Use Pain Medications: See MAR Prescriptions: See MAR Over the Counter: See MAR History of alcohol / drug use?: Yes Longest period of sobriety (when/how long): unknown Substance #1 Name of Substance 1: alcohol 1 - Age of First Use: 18 1 - Amount (size/oz): 2 25 oz beers 1 - Frequency: daily 1 - Last Use / Amount: 07/11/2018  CIWA: CIWA-Ar BP: (!) 155/83 Pulse Rate: 66 COWS:    Allergies:  Allergies  Allergen Reactions  . Diphenhydramine Hcl Other (See Comments)    Restlessness  . Flexeril  [Cyclobenzaprine] Other (See Comments)    Restlessness  . Hctz [Hydrochlorothiazide] Other (See Comments)    Caused to lose sodium when taking with Lisinopril  . Nsaids Other (See Comments)    GI upset  . Gabapentin Diarrhea  . Hydroxyzine Other (See Comments)    Restlessness    Home Medications: (Not in a hospital admission)   OB/GYN Status:  No LMP for male patient.  General Assessment Data Location of Assessment: [facility] ED TTS Assessment: In system Is this a Tele or Face-to-Face Assessment?: Face-to-Face Is this an Initial Assessment or a Re-assessment for this encounter?: Initial Assessment Patient Accompanied by:: N/A Language Other than English: No Living Arrangements: Other (Comment)(house) What gender do you identify as?: Male Marital status: Single Living Arrangements: Alone Can pt return to current living arrangement?: Yes Admission Status: Voluntary Is patient capable of signing voluntary admission?: Yes Referral Source: Self/Family/Friend Insurance type: Medicaid     Crisis Care Plan Living Arrangements: Alone Legal Guardian: Other:(self) Name of Psychiatrist: none per pt Name of Therapist: [facility] C[facility]  Education Status Is patient currently in school?: No Highest grade of school patient has completed: 12th grade  Is the patient employed, unemployed or receiving disability?: Receiving disability income  Risk to self with the past 6 months Suicidal Ideation: Yes-Currently Present Has patient been a risk to self within the past 6 months prior to admission? : No Suicidal Intent: Yes-Currently Present Has patient had any suicidal intent within the past 6 months prior to admission? : No Is patient at risk for suicide?: Yes Suicidal Plan?: Yes-Currently Present Has patient had any suicidal plan within the past 6 months prior to admission? : Yes Specify Current Suicidal Plan: cut self with a knife Access to Means: Yes Specify Access to Suicidal Means: has  a knife at home What has been your use of drugs/alcohol within the last 12 months?: daily alcohol use Previous Attempts/Gestures: Yes How many times?: 1 Other Self Harm Risks: none Triggers for Past Attempts: Unpredictable Intentional Self Injurious Behavior: None Family Suicide History: No Recent stressful life event(s): Recent negative physical changes, Other (Comment)(running out of medication) Persecutory voices/beliefs?: No Depression: Yes Depression Symptoms: Insomnia, Despondent, Guilt, Loss of interest in usual pleasures, Feeling worthless/self pity Substance abuse history and/or treatment for substance abuse?: Yes Suicide prevention information given to non-admitted patients: Yes  Risk to Others within the past 6 months Homicidal Ideation: No Does patient have any lifetime risk of violence toward others beyond the six months prior to admission? : No Thoughts of Harm to Others: No Current Homicidal Intent:  No Current Homicidal Plan: No Access to Homicidal Means: No Identified Victim: pt denies History of harm to others?: No Assessment of Violence: None Noted Violent Behavior Description: pt denies Does patient have access to weapons?: No Criminal Charges Pending?: No Does patient have a court date: No Is patient on probation?: No  Psychosis Hallucinations: Visual Delusions: None noted  Mental Status Report Appearance/Hygiene: Unremarkable, In scrubs Eye Contact: Fair Motor Activity: Unable to assess Speech: Logical/coherent Level of Consciousness: Alert Mood: Depressed Affect: Depressed Anxiety Level: None Thought Processes: Coherent, Relevant Judgement: Impaired Orientation: Person, Place, Time, Situation Obsessive Compulsive Thoughts/Behaviors: None  Cognitive Functioning Concentration: Normal Memory: Recent Intact, Remote Intact Is patient IDD: No Insight: Poor Impulse Control: Poor Appetite: Poor Have you had any weight changes? : No Change Sleep:  Decreased Total Hours of Sleep: 2 Vegetative Symptoms: None  ADLScreening [facility] Assessment Services) Patient's cognitive ability adequate to safely complete daily activities?: Yes Patient able to express need for assistance with ADLs?: Yes Independently performs ADLs?: Yes (appropriate for developmental age)  Prior Inpatient Therapy Prior Inpatient Therapy: Yes Prior Therapy Dates: 06/2018 Prior Therapy Facilty/Provider(s): [facility] Reason for Treatment: SI gestures  Prior Outpatient Therapy Prior Outpatient Therapy: Yes Prior Therapy Dates: Ongoing Prior Therapy Facilty/Provider(s): [facility] Reason for Treatment: depression Does patient have an ACCT team?: No Does patient have Intensive In-House Services?  : No Does patient have [facility] services? : No Does patient have P4CC services?: No  ADL Screening (condition at time of admission) Patient's cognitive ability adequate to safely complete daily activities?: Yes Patient able to express need for assistance with ADLs?: Yes Independently performs ADLs?: Yes (appropriate for developmental age)       Abuse/Neglect Assessment (Assessment to be complete while patient is alone) Abuse/Neglect Assessment Can Be Completed: Yes Physical Abuse: Denies Verbal Abuse: Denies Sexual Abuse: Denies Exploitation of patient/patient's resources: Denies Self-Neglect: Denies Values / Beliefs Cultural Requests During Hospitalization: None Spiritual Requests During Hospitalization: None Consults Spiritual Care Consult Needed: No Social Work Consult Needed: No            Disposition:  Disposition Initial Assessment Completed for this Encounter: Yes  NP [employer] recommends in patient treatment.  The pt's RN and MD were made aware of the recommendation.  On Site Evaluation by:   Reviewed with Physician:    Enzo Montgomery 07/12/2018 2:50 PM

## 2018-07-12 NOTE — Progress Notes (Signed)
Patient is seen by me via tele-psych and I consulted with Dr. Dwyane Dee.  At first patient reports that he is suicidal because he is ran out of his medications and he is not sleeping at home and states that he needs something to help him with sleep.  Reviewed [facility] controlled substance database and patient had his Lunesta filled on 07/08/2018 for #30 and he has used all of them up prior to coming to the hospital today patient also reports that he does not have any of his prescribed Ativan available either as he is used all of them up and he reports using approximately 2-25 ounce beers every night.  Patient is informed that if he comes to the inpatient unit due to his substance abuse along with his controlled substance overuse that he would be detoxed from these medications as these are unsafe practices and is not a safe for the patient to continue these medications.  Patient then states that he did have suicidal thoughts but he does not have any currently and states that he does not feel that he needs to be in the hospital.  Patient reports that his CST team does come out and help him.  Patient reports he would like something for sleep that is not controlled if it could be provided.  He was informed that it would be discussed with the EDP.  I have contacted [employer] and notified her of the recommendations.  At this time patient does not meet inpatient criteria and is psychiatrically cleared.  Per the interview with the patient it appears that patient may be using the hospital as a secondary gain for his overuse of medications so he can continue his Lunesta and Ativan when he runs out.

## 2018-07-12 NOTE — Progress Notes (Signed)
Pt meets inpatient criteria per Marvia Pickles, NP. Referral information has been sent to the following hospitals for review: [facility]  [facility]  [facility]   Disposition will continue to assist with inpatient placement needs.   Audree Camel, LCSW, [place] Disposition [facility] [phone]

## 2018-07-12 NOTE — ED Notes (Signed)
This RN was several doors down when I noted pt step out of door, slowly come down to knees and roll on to L thigh and L shoulder.  He then stated that he "fell".  However, it appeared that pt gently and purposefully put himself on the ground.

## 2018-07-12 NOTE — ED Triage Notes (Signed)
Pt in from home brought in by CST worker Herbie Baltimore, per  CST pt reporting that he feels suicidal that he reports daily and today was brought in because he continued with the SI report ue to not having sleep aids with dx of insomnia, pt told earlier this week at PCP that he does not have cancer, pt reported to have Lunesta prescription filled 07/08/18 with thirty pills and now has none, pt plan to use knife per CST worker, pt reports using ETOH for the last week drinking two 24 ounce beers daily, pt calm and cooperative at this time, pt c/o anxiety, pt reports not having family, pt can contact [facility] [phone], pt to have treatment team on Tuesday 07/16/18 for careplan development in the meanwhile the pt requesting help with SI

## 2018-07-12 NOTE — ED Notes (Signed)
Pt is becoming more restless and agitated, requesting medication, stating different parts of his body hurt "from the fall," despite it being witnessed that the pt carefully placed himself on the ground.   Pt continues to step out of the room to request medication, often multiple times within minutes of each other. This EMT explained that Patty, RN would be in to administer medication as soon as she finishes with another patient. Pt verbalizes understanding and returned to bed at this time.

## 2018-07-12 NOTE — Progress Notes (Signed)
Per Kennyth Lose, pt accepted to [facility] after 10am on 07/13/18, Call to report [phone], accepting physician is Dr. Kathlene Cote, MD. Chong Sicilian, RN has been notified of the acceptance.   Lind Covert, MSW, LCSW Therapeutic Triage Specialist  [phone]

## 2018-07-12 NOTE — ED Notes (Signed)
Herbie Baltimore called asking for update on pt [phone]

## 2018-07-12 NOTE — ED Notes (Signed)
Pt informed that he will be transferred to [facility] in the morning. Pt became verbally upset and stated that he did not want to be transported to [facility] stating that he has appointments next week and does not want to miss them.

## 2018-07-13 ENCOUNTER — Encounter (HOSPITAL_COMMUNITY): Payer: Self-pay | Admitting: Registered Nurse

## 2018-07-13 ENCOUNTER — Other Ambulatory Visit: Payer: Self-pay

## 2018-07-13 ENCOUNTER — Emergency Department (HOSPITAL_COMMUNITY): Payer: Medicaid Other

## 2018-07-13 NOTE — ED Notes (Signed)
Breakfast tray ordered 

## 2018-07-13 NOTE — ED Notes (Signed)
Pt voiced understanding and agreement w/tx plan - accepted to [facility] - Herbie Baltimore - [phone], CST Worker - aware. Pt initially refused - stating he wanted to be d/c'd to home but was fearful d/t he is not able to obtain his Seroquel and Trazodone as he did not follow up after d/c from [facility] to get them refilled. ALL belongings - 2 labeled belongings bags, 1 valuables envelope, and home meds - [facility] - Pt aware.

## 2018-07-13 NOTE — Consult Note (Signed)
  Tele Assessment   Anthony Lamb, 63 y.o., male patient presented to [facility] with complaints of suicidal ideation.  Patient has gone back and forth with complaints of suicidal ideation.  Patient has been accepted to [facility] for inpatient treatment.  If patient is unwilling to sign self in voluntary; patient is to be IVC       Recommendations:  Inpatient psychiatric treatment  Disposition: Recommend psychiatric Inpatient admission when medically cleared.   Belia Heman, Therapist, sports; informed of above recommendation and disposition; states she will inform EDP   Earleen Newport, NP

## 2018-07-13 NOTE — ED Notes (Signed)
Pt came to RN desk w/ a complaint of left shoulder pain. He stated he could not find a position of comfort and requested an X-ray. Dr. Ellender Hose informed of the pt's condition and put in a verbal order for a shoulder x-ray and 650 mg of Tylenol.

## 2018-07-13 NOTE — ED Provider Notes (Signed)
23: RN notified me pt does not want to be placed at [facility].  I met with pt and he tells me he is not "suicidal anymore".  Per chart review this is the second time he has changed his mind about SI (EDPA and psych NP notes), I asked him why and he stated he has a lot of medical issues and there has been a delay in his mail medication prescriptions and was "overwhelmed".  He states he is not suicidal, does not want to die and "I want to live".  He doesn't want to be placed at [facility] because he has upcoming medical appointments that he does not want to miss.  He denies SI, HI, AVH.  Ambulatory to bathroom. Physical Exam  BP 111/69 (BP Location: Right Arm)   Pulse 81   Temp 97.9 F (36.6 C) (Oral)   Resp 19   SpO2 98%   Physical Exam Constitutional:      Appearance: He is well-developed. He is not toxic-appearing.  HENT:     Head: Normocephalic.     Right Ear: External ear normal.     Left Ear: External ear normal.     Nose: Nose normal.  Eyes:     Conjunctiva/sclera: Conjunctivae normal.  Neck:     Musculoskeletal: Full passive range of motion without pain.  Cardiovascular:     Rate and Rhythm: Normal rate.  Pulmonary:     Effort: Pulmonary effort is normal. No tachypnea or respiratory distress.  Musculoskeletal: Normal range of motion.  Skin:    General: Skin is warm and dry.     Capillary Refill: Capillary refill takes less than 2 seconds.  Neurological:     Mental Status: He is alert and oriented to person, place, and time.     Comments: Oriented to self, place, time, events. Ambulatory with steady gait. Moves all extremities spontaneously without asymmetry.  Psychiatric:        Behavior: Behavior normal.        Thought Content: Thought content normal.     Comments: No SI, HI, AVH.      ED Course/Procedures     Procedures  MDM   [phone]: I spoke to [facility] (psych NP) to discuss patient.  Chart review shows pt reported SI to EDPA, then denied to psych NP, then  again reported SI to EDPA and psych was reconsulted ultimately recommending inpatient psych tx.  Pt again denying SI to me and not wanting to be placed at [facility].  [facility] aware of patient, pending re-evaluation by psych to determine disposition.  Labs reviewed and unremarkable. Pt has no medical complaints and I do not see need for further labs, imaging or medical admission.  Disposition per psych recommendations.        Kinnie Feil, PA-C 07/13/18 [id number]    Virgel Manifold, MD 07/14/18 1325

## 2018-07-13 NOTE — ED Notes (Signed)
Pt given warm blanket for transport to [facility] and [facility] escorting pt w/[facility].

## 2018-07-29 ENCOUNTER — Telehealth: Payer: Self-pay

## 2018-07-29 NOTE — Telephone Encounter (Signed)
-----   Message from Gatha Mayer, MD sent at 07/29/2018  1:07 PM EST ----- Regarding: follow-up - still in psych hospital? We saw him and the same or next day went to ED suicidal and went to psych hospital  Need to set up EGD and colonoscopy but I wanted to see if he was discharged before I worked on that  Can you find out if he is out of the psych hospital  May need to call his case worker  Thx

## 2018-07-29 NOTE — Telephone Encounter (Signed)
I called his case manager Kennyth Lose at # [phone] and left her a detailed voice mail to call me back.

## 2018-08-01 NOTE — Telephone Encounter (Signed)
I spoke with Anthony Lamb and she said that Anthony Lamb got out of the hospital Tuesday 07/30/2018 and that he is doing well.

## 2018-08-12 NOTE — Telephone Encounter (Signed)
Let's try to get him set up for EGD and colonoscopy at [facility] on Thursday April 23 and plan for an observation admission 4/22 (I am hospital D then)  Let me know  Look at my note for reasons why please

## 2018-08-13 ENCOUNTER — Other Ambulatory Visit: Payer: Self-pay | Admitting: Internal Medicine

## 2018-08-13 DIAGNOSIS — R1310 Dysphagia, unspecified: Secondary | ICD-10-CM

## 2018-08-13 DIAGNOSIS — Z86010 Personal history of colonic polyps: Secondary | ICD-10-CM

## 2018-08-13 DIAGNOSIS — K51511 Left sided colitis with rectal bleeding: Secondary | ICD-10-CM

## 2018-08-13 NOTE — Telephone Encounter (Signed)
I have left a message for his case worker Herbie Baltimore (phone # [phone]) to call me back. I also left a message for the care coordinator Cory Munch to call me back [phone]) so I can inform them of the date and times of the procedure.   I spoke with Robin in bed placement and we have him set up to be admitted on 09/25/2018 to get prepped and then he will have his ECL on 09/26/2018 at 9:30AM at [facility].

## 2018-08-14 NOTE — Telephone Encounter (Signed)
I have spoken to Herbie Baltimore his case worker and I gave her the date/time information about Qais's upcoming procedures to be done. She wrote this down. She said to give them her number to call when the bed is ready. Phone # [phone].  She requested I mail her out information about the clear diet he is to follow the day before. Her address is: [address]. [place] [address]. She will call me with any questions once she gets the information.

## 2018-09-17 ENCOUNTER — Telehealth: Payer: Self-pay

## 2018-09-17 NOTE — Telephone Encounter (Signed)
I left a message for Herbie Baltimore (phone # [phone]) Case worker and Cory Munch [phone])  Care coordinator for the patient that we need to cancel due to Covid-19. Case canceled with [facility] endo.

## 2018-09-17 NOTE — Telephone Encounter (Signed)
-----   Message from Gatha Mayer, MD sent at 09/16/2018  4:56 PM EDT ----- Regarding: Cancel hospital procedures Need to contact him and more importantly his case worker to say we are cancelling EGD/Colon at [facility] - and notify [facility] to take off schedule  Let them know we will arrange again when able in this setting of Covid-19 restrictions  He was going to be admitted for prep - need to hold off  PJ knows how to get case worker I think - if you cannot find it

## 2018-09-18 NOTE — Telephone Encounter (Signed)
Unable to reach the Case worker or care coordinator.  Patient has been notified.

## 2018-09-25 ENCOUNTER — Ambulatory Visit: Admission: AD | Admit: 2018-09-25 | Payer: Medicaid Other | Source: Ambulatory Visit | Admitting: Internal Medicine

## 2018-09-26 ENCOUNTER — Encounter: Admission: AD | Payer: Self-pay | Source: Ambulatory Visit

## 2018-09-26 SURGERY — ESOPHAGOGASTRODUODENOSCOPY (EGD) WITH PROPOFOL
Anesthesia: Monitor Anesthesia Care

## 2018-11-05 ENCOUNTER — Telehealth: Payer: Self-pay

## 2018-11-05 NOTE — Telephone Encounter (Signed)
Admission has been arranged for 11/25/18 and procedures on 11/26/18.  I spoke with the patient and explained that he will need to go for Covid testing on 11/21/18 then go home and quarantine until the admission on 11/25/18.  Patient reports that he has claustrophobia and anxiety and is not willing to be quartiened  In his home for that many days.  He wants to wait on scheduling the procedures until the Covid restrictions have been removed .  Dr Carlean Purl please review.  I did leave a message for his case worker Kennyth Lose and am awaiting a return call from her as well.

## 2018-11-05 NOTE — Telephone Encounter (Signed)
-----   Message from Gatha Mayer, MD sent at 11/05/2018  3:00 PM EDT ----- Regarding: EGD and colooscopy 6/23 w/ admit on 6/22 Please see if we can arrange for an obs  admission for prep 6/22 and EGD/dili and colonoscopy 6/23 when I have a block  He cannot prep at home - he has a case worker we need to contact also to help coordinate this  Thanks  Let me know so I can let the APP know about this

## 2018-11-13 NOTE — Telephone Encounter (Signed)
I spoke to the patient about his upcoming 6/23 EGD and colonoscopy - he still wants to do this  He is now ok with [place] calling his case worker [name]  Her # is [phone]  Note he needs EGD and colonoscopy scheduled (think its just colonoscopy now)  I have asked where he is to go for the rapid test on 6/22 - suggest he come 11-12 ish that day if acceptable  Should hear back by Email about this  He will need a reminder to go clear liquids on 6/22 AM - perhaps case worker may help with that  I have created a reminder so I let APP know about the obs admit also   [facility]

## 2018-11-13 NOTE — Telephone Encounter (Signed)
Left message for Anthony Lamb to return call Case is in as an endo/colon

## 2018-11-14 NOTE — Telephone Encounter (Signed)
I spoke with [name], his case worker.  She verbalized understanding of appt dates and times.  She knows to have him at [facility] on 11/25/18 11:00 for Covid screen with planned admission then D/C on 11/26/18 after the procedures.  She will instruct him to be on a clear liquid diet the am of 11/25/18.    Reminder sent to myself to call him on 6/22 and remind about the clear and Covid screen.

## 2018-11-25 ENCOUNTER — Other Ambulatory Visit: Payer: Self-pay

## 2018-11-25 ENCOUNTER — Observation Stay (HOSPITAL_COMMUNITY)
Admission: AD | Admit: 2018-11-25 | Discharge: 2018-11-26 | Disposition: A | Discharge status: Home / Self Care | Payer: Medicaid Other | Source: Ambulatory Visit | Attending: Internal Medicine | Admitting: Internal Medicine

## 2018-11-25 ENCOUNTER — Encounter (HOSPITAL_COMMUNITY): Payer: Self-pay

## 2018-11-25 ENCOUNTER — Other Ambulatory Visit (HOSPITAL_COMMUNITY)
Admission: RE | Admit: 2018-11-25 | Discharge: 2018-11-25 | Disposition: A | Discharge status: Home / Self Care | Payer: Medicaid Other | Source: Ambulatory Visit | Attending: Internal Medicine | Admitting: Internal Medicine

## 2018-11-25 ENCOUNTER — Telehealth: Payer: Self-pay

## 2018-11-25 DIAGNOSIS — F419 Anxiety disorder, unspecified: Secondary | ICD-10-CM | POA: Diagnosis not present

## 2018-11-25 DIAGNOSIS — K222 Esophageal obstruction: Secondary | ICD-10-CM

## 2018-11-25 DIAGNOSIS — Z800 Family history of malignant neoplasm of digestive organs: Secondary | ICD-10-CM | POA: Diagnosis not present

## 2018-11-25 DIAGNOSIS — M1990 Unspecified osteoarthritis, unspecified site: Secondary | ICD-10-CM | POA: Insufficient documentation

## 2018-11-25 DIAGNOSIS — M459 Ankylosing spondylitis of unspecified sites in spine: Secondary | ICD-10-CM | POA: Insufficient documentation

## 2018-11-25 DIAGNOSIS — K644 Residual hemorrhoidal skin tags: Secondary | ICD-10-CM | POA: Insufficient documentation

## 2018-11-25 DIAGNOSIS — Z79899 Other long term (current) drug therapy: Secondary | ICD-10-CM | POA: Insufficient documentation

## 2018-11-25 DIAGNOSIS — I10 Essential (primary) hypertension: Secondary | ICD-10-CM | POA: Diagnosis not present

## 2018-11-25 DIAGNOSIS — Z8249 Family history of ischemic heart disease and other diseases of the circulatory system: Secondary | ICD-10-CM | POA: Insufficient documentation

## 2018-11-25 DIAGNOSIS — K219 Gastro-esophageal reflux disease without esophagitis: Secondary | ICD-10-CM | POA: Insufficient documentation

## 2018-11-25 DIAGNOSIS — Z87891 Personal history of nicotine dependence: Secondary | ICD-10-CM | POA: Diagnosis not present

## 2018-11-25 DIAGNOSIS — K5150 Left sided colitis without complications: Principal | ICD-10-CM

## 2018-11-25 DIAGNOSIS — Z8379 Family history of other diseases of the digestive system: Secondary | ICD-10-CM | POA: Insufficient documentation

## 2018-11-25 DIAGNOSIS — F329 Major depressive disorder, single episode, unspecified: Secondary | ICD-10-CM | POA: Insufficient documentation

## 2018-11-25 DIAGNOSIS — Z8719 Personal history of other diseases of the digestive system: Secondary | ICD-10-CM | POA: Diagnosis not present

## 2018-11-25 DIAGNOSIS — G8929 Other chronic pain: Secondary | ICD-10-CM | POA: Insufficient documentation

## 2018-11-25 DIAGNOSIS — D124 Benign neoplasm of descending colon: Secondary | ICD-10-CM | POA: Diagnosis not present

## 2018-11-25 DIAGNOSIS — Z86010 Personal history of colonic polyps: Secondary | ICD-10-CM | POA: Diagnosis not present

## 2018-11-25 DIAGNOSIS — Z818 Family history of other mental and behavioral disorders: Secondary | ICD-10-CM | POA: Insufficient documentation

## 2018-11-25 DIAGNOSIS — R1310 Dysphagia, unspecified: Secondary | ICD-10-CM

## 2018-11-25 DIAGNOSIS — F1011 Alcohol abuse, in remission: Secondary | ICD-10-CM | POA: Insufficient documentation

## 2018-11-25 DIAGNOSIS — K317 Polyp of stomach and duodenum: Secondary | ICD-10-CM

## 2018-11-25 DIAGNOSIS — E222 Syndrome of inappropriate secretion of antidiuretic hormone: Secondary | ICD-10-CM | POA: Insufficient documentation

## 2018-11-25 DIAGNOSIS — Z888 Allergy status to other drugs, medicaments and biological substances status: Secondary | ICD-10-CM | POA: Insufficient documentation

## 2018-11-25 DIAGNOSIS — D125 Benign neoplasm of sigmoid colon: Secondary | ICD-10-CM | POA: Diagnosis not present

## 2018-11-25 DIAGNOSIS — Z1159 Encounter for screening for other viral diseases: Secondary | ICD-10-CM | POA: Insufficient documentation

## 2018-11-25 DIAGNOSIS — Z1211 Encounter for screening for malignant neoplasm of colon: Secondary | ICD-10-CM | POA: Diagnosis present

## 2018-11-25 LAB — SARS CORONAVIRUS 2 (TAT 6-24 HRS): SARS Coronavirus 2: NEGATIVE

## 2018-11-25 MED ORDER — BREXPIPRAZOLE 2 MG PO TABS
2.0000 mg | ORAL_TABLET | Freq: Every day | ORAL | Status: DC
  Administered 2018-11-26: 2 mg via ORAL
  Filled 2018-11-25: qty 1

## 2018-11-25 MED ORDER — POLYETHYLENE GLYCOL 3350 17 GM/SCOOP PO POWD
0.5000 | Freq: Once | ORAL | Status: AC
  Administered 2018-11-25: 127.5 g via ORAL
  Filled 2018-11-25: qty 255

## 2018-11-25 MED ORDER — ZOLPIDEM TARTRATE 5 MG PO TABS: 5.0000 mg | ORAL_TABLET | Freq: Every evening | ORAL | Status: DC | PRN

## 2018-11-25 MED ORDER — DEXTROSE IN LACTATED RINGERS 5 % IV SOLN
INTRAVENOUS | Status: DC
  Administered 2018-11-25: 15:00:00 via INTRAVENOUS

## 2018-11-25 MED ORDER — TRAMADOL HCL 50 MG PO TABS
50.0000 mg | ORAL_TABLET | Freq: Four times a day (QID) | ORAL | Status: DC | PRN
  Administered 2018-11-25: 50 mg via ORAL
  Filled 2018-11-25: qty 1

## 2018-11-25 MED ORDER — ACETAMINOPHEN 325 MG PO TABS
650.0000 mg | ORAL_TABLET | Freq: Four times a day (QID) | ORAL | Status: DC | PRN
  Administered 2018-11-25: 650 mg via ORAL
  Filled 2018-11-25: qty 2

## 2018-11-25 MED ORDER — METOCLOPRAMIDE HCL 5 MG/ML IJ SOLN
10.0000 mg | Freq: Once | INTRAMUSCULAR | Status: AC
  Administered 2018-11-25: 10 mg via INTRAVENOUS
  Filled 2018-11-25: qty 2

## 2018-11-25 MED ORDER — ZOLPIDEM TARTRATE 10 MG PO TABS
10.0000 mg | ORAL_TABLET | Freq: Every day | ORAL | Status: DC
  Administered 2018-11-25: 10 mg via ORAL
  Filled 2018-11-25: qty 1

## 2018-11-25 MED ORDER — VERAPAMIL HCL ER 240 MG PO TBCR
240.0000 mg | EXTENDED_RELEASE_TABLET | Freq: Every day | ORAL | Status: DC
  Administered 2018-11-25: 240 mg via ORAL
  Filled 2018-11-25: qty 1

## 2018-11-25 MED ORDER — LISINOPRIL 10 MG PO TABS
10.0000 mg | ORAL_TABLET | Freq: Every day | ORAL | Status: DC
  Administered 2018-11-25: 10 mg via ORAL
  Filled 2018-11-25: qty 1

## 2018-11-25 MED ORDER — ONDANSETRON HCL 4 MG/2ML IJ SOLN: 4.0000 mg | Freq: Four times a day (QID) | INTRAMUSCULAR | Status: DC | PRN

## 2018-11-25 MED ORDER — TRAZODONE HCL 50 MG PO TABS: 50.0000 mg | ORAL_TABLET | Freq: Once | ORAL | Status: DC

## 2018-11-25 MED ORDER — SUCRALFATE 1 G PO TABS
1.0000 g | ORAL_TABLET | Freq: Three times a day (TID) | ORAL | Status: DC
  Administered 2018-11-25 – 2018-11-26 (×4): 1 g via ORAL
  Filled 2018-11-25 (×4): qty 1

## 2018-11-25 MED ORDER — CLONAZEPAM 1 MG PO TABS
1.0000 mg | ORAL_TABLET | Freq: Three times a day (TID) | ORAL | Status: DC
  Administered 2018-11-25 – 2018-11-26 (×3): 1 mg via ORAL
  Filled 2018-11-25 (×3): qty 1

## 2018-11-25 MED ORDER — DULOXETINE HCL 30 MG PO CPEP
90.0000 mg | ORAL_CAPSULE | Freq: Every day | ORAL | Status: DC
  Administered 2018-11-26: 90 mg via ORAL
  Filled 2018-11-25: qty 3

## 2018-11-25 MED ORDER — TIZANIDINE HCL 4 MG PO TABS
2.0000 mg | ORAL_TABLET | Freq: Three times a day (TID) | ORAL | Status: DC
  Administered 2018-11-25 – 2018-11-26 (×3): 2 mg via ORAL
  Filled 2018-11-25 (×3): qty 1

## 2018-11-25 MED ORDER — POLYETHYLENE GLYCOL 3350 17 GM/SCOOP PO POWD
0.5000 | Freq: Once | ORAL | Status: AC
  Administered 2018-11-25: 127.5 g via ORAL

## 2018-11-25 MED ORDER — PANTOPRAZOLE SODIUM 40 MG PO TBEC
40.0000 mg | DELAYED_RELEASE_TABLET | Freq: Every day | ORAL | Status: DC
  Administered 2018-11-26: 40 mg via ORAL
  Filled 2018-11-25: qty 1

## 2018-11-25 MED ORDER — QUETIAPINE FUMARATE 25 MG PO TABS
150.0000 mg | ORAL_TABLET | Freq: Every day | ORAL | Status: DC
  Administered 2018-11-25: 150 mg via ORAL
  Filled 2018-11-25 (×2): qty 2

## 2018-11-25 MED ORDER — ACETAMINOPHEN 650 MG RE SUPP: 650.0000 mg | Freq: Four times a day (QID) | RECTAL | Status: DC | PRN

## 2018-11-25 MED ORDER — BISACODYL 5 MG PO TBEC
20.0000 mg | DELAYED_RELEASE_TABLET | Freq: Once | ORAL | Status: AC
  Administered 2018-11-25: 20 mg via ORAL
  Filled 2018-11-25: qty 4

## 2018-11-25 NOTE — Telephone Encounter (Signed)
Nurse from [facility] called to inform that pt has been admitted to room 1521.

## 2018-11-25 NOTE — Telephone Encounter (Signed)
I called and spoke to Anthony Lamb and he ask I call his driver [name] at [phone]. I did and had to leave her a voice mail.

## 2018-11-25 NOTE — Progress Notes (Signed)
[name] is admitted in room 1521-01, did not call for pre op history review

## 2018-11-25 NOTE — Progress Notes (Signed)
Patient is directed admission from home, arrived at [facility] at 1130. Alert and oriented x4. RN called admission nurse and [facility] office to leave a message for Dr. Carlean Purl. Room is set up. Call light is within patient's reach. Covid19 result is pending.

## 2018-11-25 NOTE — H&P (Signed)
[facility] Admission Note   Primary Care Physician:  Sandi Mariscal, MD at [facility]. Primary Gastroenterologist:  Dr. Carlean Purl  CHIEF COMPLAINT:  Admission to hospital for bowel prep   HPI: Anthony Lamb is a 63 y.o. male.  Ankylosing spondylitis.  DDD.  Severe anxiety with depression.  Htn.   2006 colonoscopy.  Tubulovillous adenomas removed. 2010 colonoscopy.  Proctitis, external hemorrhoids.  Seen in office early Feb 2020 by Dr Carlean Purl for odyno and dysphagia to solids and liquids as well as rectal pain and bleeding.   Plans for colonoscopy and endoscopy were delayed, first due to inpatient admission to behavioral health and then due to COVID/deferring nonurgent cases.  Procedures finally set up for tomorrow, 6/23.  Due to emotional and physical  limitations, Dr. Carlean Purl plans observation admission with inpt prep prior to tomorrow's procedures. Since Feb still having solid an liquid dysphagia, feels food sticking in region upper esophagus, sometimes coughs, chokes and regurgitates.  Po intake  variable but eats sandwiches.  Occasional headaches from "dehydration" but has good, brisk urinary output.  BM's daily, has not seen BPR lately.    He was inpatient at [facility] behavioral health in late January 2020 after cutting himself on the wrist.  He underwent psychiatric inpatient treatment for 2 weeks at [facility] starting 07/13/2018.  He had cut himself with a knife, was anxious and expressed suicidal ideation.  Lives independently at home.  Does not smoke cigarettes, marijuana or drink alcoholic beverages.  Does not have a lot of friends, no siblings and his mother and father are deceased.  His caseworker reports irregular compliance with medications but lately taking meds as prescribed.    Family history colon cancer in his father.   Past Medical History:  Diagnosis Date  . Alcohol abuse, in remission   . Anal fissure    . Anemia   . Ankylosing spondylitis ([place])   . Anxiety   . Arthritis   . Chronic diarrhea   . Chronic headaches   . Chronic pain   . Colitis   . Colon polyps   . Depression   . Esophagitis   . Gastritis   . GERD (gastroesophageal reflux disease)   . Hypertension   . IBS (irritable bowel syndrome)   . Poor dentition   . SIADH (syndrome of inappropriate ADH production) ([place])     Past Surgical History:  Procedure Laterality Date  . COLONOSCOPY W/ BIOPSIES    . ESOPHAGOGASTRODUODENOSCOPY    . HERNIA REPAIR Bilateral   . OPEN REDUCTION INTERNAL FIXATION (ORIF) DISTAL RADIAL FRACTURE Right 02/11/2018   Procedure: OPEN REDUCTION INTERNAL FIXATION (ORIF) DISTAL RADIAL FRACTURE;  Surgeon: Milly Jakob, MD;  Location: [facility];  Service: Orthopedics;  Laterality: Right;  . TONSILLECTOMY      Prior to Admission medications   Medication Sig Start Date End Date Taking? Authorizing Provider  acetaminophen (TYLENOL) 325 MG tablet Take 2 tablets (650 mg total) by mouth every 6 (six) hours as needed for mild pain. (May buy over the counter) 06/27/18  Yes Connye Burkitt, NP  Brexpiprazole (REXULTI) 2 MG TABS Take 2 mg by mouth daily.   Yes [provider]  Calcium Carb-Cholecalciferol (CALCIUM+D3 PO) Take 1 tablet by mouth daily.   Yes [provider]  clonazePAM (KLONOPIN) 1 MG tablet Take 1 mg by mouth 3 (three) times daily.   Yes [provider]  DULoxetine (CYMBALTA) 30 MG capsule Take 90 mg by mouth daily.  Yes [provider]  Eszopiclone (ESZOPICLONE) 3 MG TABS Take 3 mg by mouth at bedtime. Take immediately before bedtime   Yes [provider]  lisinopril (PRINIVIL,ZESTRIL) 10 MG tablet Take 1 tablet (10 mg total) by mouth daily. Patient taking differently: Take 10 mg by mouth at bedtime.  08/21/17 11/21/19 Yes Lacroce, Hulen Shouts, MD  pantoprazole (PROTONIX) 40 MG tablet Take 1 tablet (40 mg total) by mouth daily. 04/24/14  Yes Janith Lima,  MD  QUEtiapine (SEROQUEL) 300 MG tablet Take 150 mg by mouth at bedtime.   Yes [provider]  sodium chloride (OCEAN) 0.65 % SOLN nasal spray Place 2 sprays into both nostrils 4 (four) times daily as needed for congestion.    Yes [provider]  sucralfate (CARAFATE) 1 G tablet Take 1 tablet (1 g total) by mouth 4 (four) times daily -  with meals and at bedtime. Chew before swallowing Patient taking differently: Take 1 g by mouth 4 (four) times daily. Chew before swallowing 12/17/13  Yes Janith Lima, MD  tiZANidine (ZANAFLEX) 4 MG tablet Take 2 mg by mouth 3 (three) times daily.   Yes [provider]  verapamil (CALAN-SR) 240 MG CR tablet Take 240 mg by mouth at bedtime. 02/09/17  Yes [provider]  LORazepam (ATIVAN) 1 MG tablet Take 1 tablet (1 mg total) by mouth every 6 (six) hours as needed (CIWA > 10). Patient not taking: Reported on 09/13/2018 06/27/18   Connye Burkitt, NP  mirtazapine (REMERON) 7.5 MG tablet Take 1 tablet (7.5 mg total) by mouth at bedtime. Patient not taking: Reported on 07/12/2018 06/29/18   Derrill Center, NP  Multiple Vitamin (MULTIVITAMIN WITH MINERALS) TABS tablet Take 1 tablet by mouth daily. (May buy over the counter) 06/28/18   Connye Burkitt, NP  QUEtiapine (SEROQUEL) 25 MG tablet Take 1 tablet (25 mg total) by mouth at bedtime. For mood Patient not taking: Reported on 09/13/2018 06/27/18   Connye Burkitt, NP  traZODone (DESYREL) 50 MG tablet Take 1 tablet (50 mg total) by mouth at bedtime as needed for sleep. Patient not taking: Reported on 07/12/2018 06/27/18   Connye Burkitt, NP    Current Facility-Administered Medications  Medication Dose Route Frequency Provider Last Rate Last Dose  . acetaminophen (TYLENOL) tablet 650 mg  650 mg Oral Q6H PRN Gatha Mayer, MD       Or  . acetaminophen (TYLENOL) suppository 650 mg  650 mg Rectal Q6H PRN Gatha Mayer, MD      . bisacodyl (DULCOLAX) EC tablet 20 mg  20 mg Oral Once  Gatha Mayer, MD      . dextrose 5 % in lactated ringers infusion   Intravenous Continuous Gatha Mayer, MD      . metoCLOPramide (REGLAN) injection 10 mg  10 mg Intravenous Once Gatha Mayer, MD      . metoCLOPramide (REGLAN) injection 10 mg  10 mg Intravenous Once Gatha Mayer, MD      . ondansetron [facility]) injection 4 mg  4 mg Intravenous Q6H PRN Gatha Mayer, MD      . polyethylene glycol powder (GLYCOLAX/MIRALAX) container 127.5 g  0.5 Container Oral Once Gatha Mayer, MD      . polyethylene glycol powder (GLYCOLAX/MIRALAX) container 127.5 g  0.5 Container Oral Once Gatha Mayer, MD      . traMADol Veatrice Bourbon) tablet 50 mg  50 mg Oral Q6H PRN  Gatha Mayer, MD        Allergies as of 11/05/2018 - Review Complete 09/13/2018  Allergen Reaction Noted  . Diphenhydramine hcl Other (See Comments) 12/17/2008  . Flexeril [cyclobenzaprine] Other (See Comments) 06/20/2013  . Hctz [hydrochlorothiazide] Other (See Comments) 08/31/2017  . Nsaids Other (See Comments) 05/17/2010  . Gabapentin Diarrhea 07/12/2018  . Hydroxyzine Other (See Comments) 03/12/2018    Family History  Problem Relation Age of Onset  . Anxiety disorder Mother   . Congestive Heart Failure Mother   . Crohn's disease Mother   . Colon cancer Mother 61  . Arthritis Father   . High blood pressure Father   . Crohn's disease Father     Social History   Socioeconomic History  . Marital status: Single    Spouse name: Not on file  . Number of children: 0  . Years of education: Not on file  . Highest education level: Not on file  Occupational History  . Not on file  Social Needs  . Financial resource strain: Not on file  . Food insecurity    Worry: Not on file    Inability: Not on file  . Transportation needs    Medical: Not on file    Non-medical: Not on file  Tobacco Use  . Smoking status: Former Smoker    Types: Cigarettes    Quit date: 2014    Years since quitting: 6.4  . Smokeless  tobacco: Never Used  Substance and Sexual Activity  . Alcohol use: Yes    Comment: 1 25 oz of beer a night  . Drug use: No  . Sexual activity: Not Currently  Lifestyle  . Physical activity    Days per week: Not on file    Minutes per session: Not on file  . Stress: Not on file  Relationships  . Social Herbalist on phone: Not on file    Gets together: Not on file    Attends religious service: Not on file    Active member of club or organization: Not on file    Attends meetings of clubs or organizations: Not on file    Relationship status: Not on file  . Intimate partner violence    Fear of current or ex partner: Not on file    Emotionally abused: Not on file    Physically abused: Not on file    Forced sexual activity: Not on file  Other Topics Concern  . Not on file  Social History Narrative   HSG. Long - term disability - unable to work. Lived with his mother in her house - she died 05/04/23 - he is bankrupt/destitute and soon will loose his home. He has not worked enough quarters to be eligible for [employer]. He is advised to seek assistance for health care either with [place].    REVIEW OF SYSTEMS: Constitutional: Overall generally feels tired. ENT: No nosebleeds.  No nasal discharge.  Extremely poor dentition, either his teeth are missing or have eroded down to knobs which make mastication difficult. Pulm: No shortness of breath or cough. CV: No chest pain, no palpitations. GU: No hematuria, no oliguria, no dysuria. GI: See HPI. Heme: Denies unusual or excessive bleeding or bruising..    Transfusions: Has never been transfused with blood products. Neuro: Easily gets anxious and panics.  Has some shaking of his extremities, especially since starting on new psych meds in late February. Derm: No rashes  or sores.  No itching. Endocrine: No issues with elevated blood sugars, excessive thirst or excessive urination. Immunization:  Did not inquire as to recent or past vaccinations Travel: None.  Patient rarely leaves his house.   PHYSICAL EXAM:  Temp:  [98.6 F (37 C)] 98.6 F (37 C) (06/22 1132) Pulse Rate:  [76] 76 (06/22 1132) Resp:  [16] 16 (06/22 1132) BP: (152)/(82) 152/82 (06/22 1132) SpO2:  [97 %] 97 % (06/22 1132) Weight:  [80.8 kg] 80.8 kg (06/22 1132)    General: Pale, does not look ill.  Slightly anxious but pleasant with fluid speech. Ears: Not hard of hearing Mouth: Teeth are either nubs at the level of the gum or absent.  Mucosa is pink, moist, clear.  Tongue is midline. Neck: No mass, no thyromegaly, no JVD. Lungs: Clear bilaterally.  No labored breathing. Heart: RRR.  No MRG.  S1, S2 present. Abdomen: Soft.  Not tender or distended.  No HSM, masses, bruits, hernias.    Rectal: Deferred rectal exam. Msk: No joint redness or swelling.  Other than some mild spinal kyphosis, no gross joint contractures or deformities.  Pulses: 2 -3+ pedal pulses. Extremities: No CCE. Neurologic: Slight tremor in the hands but full upper and lower extremity strength.  Oriented x3.  No gross deficits. Skin: No rash, sores or suspicious lesions. Psych: Cooperative, pleasant, slightly anxious.  Fluid speech.  Intake/Output from previous day: No intake/output data recorded. Intake/Output this shift: No intake/output data recorded.  LAB RESULTS: No results for input(s): WBC, HGB, HCT, PLT in the last 72 hours. BMET No results for input(s): NA, K, CL, CO2, GLUCOSE, BUN, CREATININE, CALCIUM in the last 72 hours. LFT No results for input(s): PROT, ALBUMIN, AST, ALT, ALKPHOS, BILITOT, BILIDIR, IBILI in the last 72 hours. PT/INR No results for input(s): LABPROT, INR in the last 72 hours. Hepatitis Panel No results for input(s): HEPBSAG, HCVAB, HEPAIGM, HEPBIGM in the last 72 hours.   RADIOLOGY STUDIES: No results found.   IMPRESSION:    *   Solid, liquid food dysphagia.    *   Bleeding PR, improved.  Hx  adenomatous colon polyps 2006, none in 2010. Hemorrhoids on colonoscopy 2010.    *   Anxiety. Depression.  On multiple meds.     PLAN:  *  Admission today, EGD and colonoscopy set for 10:15 tmrw.  Bowel prep has been ordered.    LOS: 0 days   Azucena Freed  11/25/2018, 1:35 PM Phone [phone]

## 2018-11-25 NOTE — Telephone Encounter (Signed)
-----   Message from Marlon Pel, RN sent at 11/14/2018  2:21 PM EDT ----- Please call patient in early am and remind him to go to [facility] at 11:00 on 6/22 for Covid screen and planned admission and clear liquid diet.  Will go to drive up testing and wait in car for results.  Case worker is [name]   Thank you

## 2018-11-25 NOTE — Telephone Encounter (Signed)
Dr Carlean Purl is aware and he is going to let [place] PA-C know.

## 2018-11-26 ENCOUNTER — Encounter (HOSPITAL_COMMUNITY)
Admission: AD | Disposition: A | Discharge status: Home / Self Care | Payer: Self-pay | Source: Ambulatory Visit | Attending: Internal Medicine

## 2018-11-26 ENCOUNTER — Observation Stay (HOSPITAL_COMMUNITY): Payer: Medicaid Other | Admitting: Certified Registered Nurse Anesthetist

## 2018-11-26 ENCOUNTER — Encounter (HOSPITAL_COMMUNITY): Payer: Self-pay

## 2018-11-26 DIAGNOSIS — D125 Benign neoplasm of sigmoid colon: Secondary | ICD-10-CM | POA: Diagnosis not present

## 2018-11-26 DIAGNOSIS — D124 Benign neoplasm of descending colon: Secondary | ICD-10-CM | POA: Diagnosis not present

## 2018-11-26 DIAGNOSIS — K317 Polyp of stomach and duodenum: Secondary | ICD-10-CM | POA: Diagnosis not present

## 2018-11-26 DIAGNOSIS — K222 Esophageal obstruction: Secondary | ICD-10-CM

## 2018-11-26 DIAGNOSIS — K5150 Left sided colitis without complications: Secondary | ICD-10-CM

## 2018-11-26 DIAGNOSIS — R1310 Dysphagia, unspecified: Secondary | ICD-10-CM

## 2018-11-26 HISTORY — PX: ESOPHAGOGASTRODUODENOSCOPY (EGD) WITH PROPOFOL: SHX5813

## 2018-11-26 HISTORY — PX: BIOPSY: SHX5522

## 2018-11-26 HISTORY — PX: HEMOSTASIS CLIP PLACEMENT: SHX6857

## 2018-11-26 HISTORY — PX: POLYPECTOMY: SHX5525

## 2018-11-26 HISTORY — PX: HOT HEMOSTASIS: SHX5433

## 2018-11-26 HISTORY — PX: COLONOSCOPY WITH PROPOFOL: SHX5780

## 2018-11-26 SURGERY — COLONOSCOPY WITH PROPOFOL
Anesthesia: Monitor Anesthesia Care

## 2018-11-26 MED ORDER — PROPOFOL 500 MG/50ML IV EMUL
INTRAVENOUS | Status: DC | PRN
  Administered 2018-11-26: 150 ug/kg/min via INTRAVENOUS

## 2018-11-26 MED ORDER — PROPOFOL 10 MG/ML IV BOLUS
INTRAVENOUS | Status: AC
  Filled 2018-11-26: qty 40

## 2018-11-26 MED ORDER — LACTATED RINGERS IV SOLN
INTRAVENOUS | Status: DC
  Administered 2018-11-26: 09:00:00 via INTRAVENOUS

## 2018-11-26 MED ORDER — ONDANSETRON HCL 4 MG/2ML IJ SOLN
INTRAMUSCULAR | Status: DC | PRN
  Administered 2018-11-26: 4 mg via INTRAVENOUS

## 2018-11-26 MED ORDER — PROPOFOL 10 MG/ML IV BOLUS
INTRAVENOUS | Status: DC | PRN
  Administered 2018-11-26 (×2): 20 mg via INTRAVENOUS

## 2018-11-26 MED ORDER — LIDOCAINE 2% (20 MG/ML) 5 ML SYRINGE
INTRAMUSCULAR | Status: DC | PRN
  Administered 2018-11-26: 60 mg via INTRAVENOUS

## 2018-11-26 SURGICAL SUPPLY — 25 items

## 2018-11-26 NOTE — Anesthesia Procedure Notes (Signed)
Procedure Name: MAC Date/Time: 11/26/2018 9:16 AM Performed by: Maxwell Caul, CRNA Pre-anesthesia Checklist: Patient identified, Emergency Drugs available, Patient being monitored and Suction available Oxygen Delivery Method: Nasal cannula

## 2018-11-26 NOTE — Op Note (Signed)
[facility] Patient Name: Anthony Lamb Procedure Date: 11/26/2018 MRN: [id number] Attending MD: Gatha Mayer , MD Date of Birth: 10-Aug-1955 CSN: [id number] Age: 63 Admit Type: Inpatient Procedure:                Colonoscopy Indications:              High risk colon cancer surveillance: Ulcerative                            left sided colitis of 8 (or more) years duration                            Also personal hx colon polyps.TV adenomas x 2 2006.                            last colonoscopy was 2010 and no polyps Providers:                Gatha Mayer, MD, Grace Isaac, RN, Cherylynn Ridges, Technician, Virgia Land, CRNA Referring MD:             Sandi Mariscal Medicines:                Propofol per Anesthesia, Monitored Anesthesia Care Complications:            No immediate complications. Estimated Blood Loss:     Estimated blood loss was minimal. Procedure:                Pre-Anesthesia Assessment:                           - Prior to the procedure, a History and Physical                            was performed, and patient medications and                            allergies were reviewed. The patient's tolerance of                            previous anesthesia was also reviewed. The risks                            and benefits of the procedure and the sedation                            options and risks were discussed with the patient.                            All questions were answered, and informed consent                            was obtained. Prior Anticoagulants: The patient has  taken no previous anticoagulant or antiplatelet                            agents. ASA Grade Assessment: III - A patient with                            severe systemic disease. After reviewing the risks                            and benefits, the patient was deemed in                            satisfactory condition to  undergo the procedure.                           - Prior to the procedure, a History and Physical                            was performed, and patient medications and                            allergies were reviewed. The patient's tolerance of                            previous anesthesia was also reviewed. The risks                            and benefits of the procedure and the sedation                            options and risks were discussed with the patient.                            All questions were answered, and informed consent                            was obtained. Prior Anticoagulants: The patient has                            taken no previous anticoagulant or antiplatelet                            agents. ASA Grade Assessment: III - A patient with                            severe systemic disease. After reviewing the risks                            and benefits, the patient was deemed in                            satisfactory condition to undergo the procedure.  After obtaining informed consent, the colonoscope                            was passed under direct vision. Throughout the                            procedure, the patient's blood pressure, pulse, and                            oxygen saturations were monitored continuously. The                            CF-HQ190L ([id number]) Olympus colonoscope was                            introduced through the anus and advanced to the the                            terminal ileum, with identification of the                            appendiceal orifice and IC valve. The colonoscopy                            was performed without difficulty. The patient                            tolerated the procedure well. The quality of the                            bowel preparation was adequate to identify polyps.                            The terminal ileum, ileocecal valve, appendiceal                             orifice, and rectum were photographed. The bowel                            preparation used was Miralax via split dose                            instruction. Scope In: 9:50:41 AM Scope Out: 10:04:07 AM Scope Withdrawal Time: 0 hours 10 minutes 37 seconds  Total Procedure Duration: 0 hours 13 minutes 26 seconds  Findings:      The perianal and digital rectal examinations were normal. Pertinent       negatives include normal prostate (size, shape, and consistency).      The terminal ileum appeared normal.      The colon (entire examined portion) appeared normal. Biopsies were taken       with a cold forceps for histology. Verification of patient       identification for the specimen was done. Estimated blood loss was       minimal.      No  additional abnormalities were found on retroflexion. Impression:               - The examined portion of the ileum was normal.                           - The entire examined colon is normal. Biopsied. Moderate Sedation:      Not Applicable - Patient had care per Anesthesia. Recommendation:           - Patient has a contact number available for                            emergencies. The signs and symptoms of potential                            delayed complications were discussed with the                            patient. Return to normal activities tomorrow.                            Written discharge instructions were provided to the                            patient.                           - Resume previous diet.                           - Continue present medications.                           - Repeat colonoscopy is recommended. The                            colonoscopy date will be determined after pathology                            results from today's exam become available for                            review. 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 All rights reserved. The codes documented in this report are preliminary and upon coder review may  be revised to meet current compliance requirements. Gatha Mayer, MD  11/26/2018 10:24:05 AM This report has been signed electronically. Number of Addenda: 0

## 2018-11-26 NOTE — Discharge Instructions (Signed)
° ° °  I found and removed some gastric polyps today. Nothing looks like cancer.  I opened up a "ring" - narrowing in the esophagus. Hope you will swallow better after this.  The colon looks good - I took biopsies to follow-up on the colitis.  I will contact you with results and plans.  DO NOT TAKE ANY   YOU HAD AN ENDOSCOPIC PROCEDURE TODAY: Refer to the procedure report and other information in the discharge instructions given to you for any specific questions about what was found during the examination. If this information does not answer your questions, please call Dr. Celesta Aver office at [phone] to clarify.   YOU SHOULD EXPECT: Some feelings of bloating in the abdomen. Passage of more gas than usual. Walking can help get rid of the air that was put into your GI tract during the procedure and reduce the bloating. If you had a lower endoscopy (such as a colonoscopy or flexible sigmoidoscopy) you may notice spotting of blood in your stool or on the toilet paper. Some abdominal soreness may be present for a day or two, also.  DIET: Your first meal following the procedure should be a light meal and then it is ok to progress to your normal diet. A half-sandwich or bowl of soup is an example of a good first meal. Heavy or fried foods are harder to digest and may make you feel nauseous or bloated. Drink plenty of fluids but you should avoid alcoholic beverages for 24 hours.   ACTIVITY: Your care partner should take you home directly after the procedure. You should plan to take it easy, moving slowly for the rest of the day. You can resume normal activity the day after the procedure however YOU SHOULD NOT DRIVE, use power tools, machinery or perform tasks that involve climbing or major physical exertion for 24 hours (because of the sedation medicines used during the test).   SYMPTOMS TO REPORT IMMEDIATELY: A gastroenterologist can be reached at any hour. Please call [phone]  for any of  the following symptoms:  Following lower endoscopy (colonoscopy, flexible sigmoidoscopy) Excessive amounts of blood in the stool  Significant tenderness, worsening of abdominal pains  Swelling of the abdomen that is new, acute  Fever of 100 or higher  Following upper endoscopy (EGD, EUS, ERCP, esophageal dilation) Vomiting of blood or coffee ground material  New, significant abdominal pain  New, significant chest pain or pain under the shoulder blades  Painful or persistently difficult swallowing  New shortness of breath  Black, tarry-looking or red, bloody stools  FOLLOW UP:  If any biopsies were taken you will be contacted by phone or by letter within the next 1-3 weeks. Call [phone]  if you have not heard about the biopsies in 3 weeks.  Please also call with any specific questions about appointments or follow up tests.

## 2018-11-26 NOTE — Anesthesia Preprocedure Evaluation (Signed)
Anesthesia Evaluation  Patient identified by MRN, date of birth, ID band Patient awake    Reviewed: Allergy & Precautions, NPO status , Patient's Chart, lab work & pertinent test results  Airway Mallampati: II  TM Distance: >3 FB Neck ROM: Full    Dental no notable dental hx.    Pulmonary neg pulmonary ROS, former smoker,    Pulmonary exam normal breath sounds clear to auscultation       Cardiovascular hypertension, negative cardio ROS Normal cardiovascular exam Rhythm:Regular Rate:Normal     Neuro/Psych Anxiety Depression negative neurological ROS     GI/Hepatic Neg liver ROS, GERD  ,  Endo/Other  negative endocrine ROS  Renal/GU negative Renal ROS  negative genitourinary   Musculoskeletal  (+) Arthritis , Ankylosing spondylitis    Abdominal   Peds negative pediatric ROS (+)  Hematology negative hematology ROS (+)   Anesthesia Other Findings   Reproductive/Obstetrics negative OB ROS                             Anesthesia Physical Anesthesia Plan  ASA: III  Anesthesia Plan: MAC   Post-op Pain Management:    Induction: Intravenous  PONV Risk Score and Plan: 0  Airway Management Planned: Simple Face Mask  Additional Equipment:   Intra-op Plan:   Post-operative Plan:   Informed Consent: I have reviewed the patients History and Physical, chart, labs and discussed the procedure including the risks, benefits and alternatives for the proposed anesthesia with the patient or authorized representative who has indicated his/her understanding and acceptance.     Dental advisory given  Plan Discussed with: CRNA and Surgeon  Anesthesia Plan Comments:         Anesthesia Quick Evaluation

## 2018-11-26 NOTE — Op Note (Signed)
[facility] Patient Name: Anthony Lamb Procedure Date: 11/26/2018 MRN: [id number] Attending MD: Gatha Mayer , MD Date of Birth: 17-Sep-1955 CSN: [id number] Age: 63 Admit Type: Inpatient Procedure:                Upper GI endoscopy Indications:              Dysphagia, Odynophagia Providers:                Gatha Mayer, MD, Grace Isaac, RN, Cherylynn Ridges, Technician, Virgia Land, CRNA Referring MD:             Sandi Mariscal Medicines:                Propofol per Anesthesia, Monitored Anesthesia Care Complications:            No immediate complications. Estimated Blood Loss:     Estimated blood loss was minimal. Procedure:                Pre-Anesthesia Assessment:                           - Prior to the procedure, a History and Physical                            was performed, and patient medications and                            allergies were reviewed. The patient's tolerance of                            previous anesthesia was also reviewed. The risks                            and benefits of the procedure and the sedation                            options and risks were discussed with the patient.                            All questions were answered, and informed consent                            was obtained. Prior Anticoagulants: The patient has                            taken no previous anticoagulant or antiplatelet                            agents. ASA Grade Assessment: III - A patient with                            severe systemic disease. After reviewing the risks  and benefits, the patient was deemed in                            satisfactory condition to undergo the procedure.                           After obtaining informed consent, the endoscope was                            passed under direct vision. Throughout the                            procedure, the patient's blood pressure,  pulse, and                            oxygen saturations were monitored continuously. The                            GIF-H190 ([id number]) Olympus gastroscope was                            introduced through the mouth, and advanced to the                            second part of duodenum. The upper GI endoscopy was                            accomplished without difficulty. The patient                            tolerated the procedure well. Scope In: Scope Out: Findings:      A mild Schatzki ring was found at the gastroesophageal junction. This       was biopsied with a cold forceps for ring disruption - no specimens.       Estimated blood loss was minimal.      Three 6 to 8 mm sessile polyps with no bleeding and stigmata of recent       bleeding were found in the gastric body. The polyp was removed with a       hot snare. Resection and retrieval were complete. Verification of       patient identification for the specimen was done. Estimated blood loss       was minimal. To prevent bleeding after the polypectomy, four hemostatic       clips were successfully placed (MR conditional). There was no bleeding       at the end of the procedure. Estimated blood loss was minimal.      The exam was otherwise without abnormality.      The cardia and gastric fundus were normal on retroflexion. Impression:               - Mild Schatzki ring. Biopsied.                           - Three gastric polyps. Resected and retrieved.  Clips (MR conditional) were placed. Slight ooze                            from one site stopped w/ clips. Other clips                            prophylactic.                           - The examination was otherwise normal. Moderate Sedation:      Not Applicable - Patient had care per Anesthesia. Recommendation:           - Patient has a contact number available for                            emergencies. The signs and symptoms of potential                             delayed complications were discussed with the                            patient. Return to normal activities tomorrow.                            Written discharge instructions were provided to the                            patient.                           - Resume previous diet.                           - Continue present medications.                           - Await pathology results.                           - See the other procedure note for documentation of                            additional recommendations.                           - No aspirin, ibuprofen, naproxen, or other                            non-steroidal anti-inflammatory drugs for 2 weeks                            after polyp removal. Procedure Code(s):        --- Professional ---                           43251, Esophagogastroduodenoscopy, flexible,  transoral; with removal of tumor(s), polyp(s), or                            other lesion(s) by snare technique                           43239, 59, Esophagogastroduodenoscopy, flexible,                            transoral; with biopsy, single or multiple Diagnosis Code(s):        --- Professional ---                           K22.2, Esophageal obstruction                           K31.7, Polyp of stomach and duodenum                           R13.10, Dysphagia, unspecified CPT copyright 2019 American Medical Association. All rights reserved. The codes documented in this report are preliminary and upon coder review may  be revised to meet current compliance requirements. Gatha Mayer, MD 11/26/2018 10:20:14 AM This report has been signed electronically. Number of Addenda: 0

## 2018-11-26 NOTE — Discharge Summary (Signed)
[facility] Discharge Summary  Name: Anthony Lamb MRN: [id number] DOB: 05/28/1956 63 y.o. PCP:  Sandi Mariscal, MD  Date of Admission: 11/25/2018 11:03 AM Date of Discharge: 11/26/2018 Primary Gastroenterologist: Discharging Physician:  Discharge Diagnosis: Active Problems:   Odynophagia   Dysphagia   Gastric polyps   Schatzki's ring   Left sided ulcerative colitis without complication ([place])   GI Procedures: EGD, Colonoscopy   Admission HPI: Admitted to observation for colonoscopy prep - to have EGD and colonoscopy. See those reports. EGD Schatzki ring disrupted w/ bx forceps, 3 gastric polyps snared and sites clipped. Colonoscopy - looked normal - surveillance bxs for UC taken.  Hospital Course by problem list: Active Problems:   Odynophagia   Dysphagia   Gastric polyps   Schatzki's ring   Left sided ulcerative colitis without complication (HCC)   Discharge Vitals:  BP (!) 124/94   Pulse 80   Temp (!) 97 F (36.1 C) (Other (Comment))   Resp 14   Ht 5' 7.52" (1.715 m)   Wt 80.8 kg   SpO2 95%   BMI 27.47 kg/m    Discharge Labs:  Results for orders placed or performed during the hospital encounter of 11/25/18 (from the past 24 hour(s))  SARS Coronavirus 2 (Performed in [facility] hospital lab)     Status: None   Collection Time: 11/25/18 11:00 AM   Specimen: Nasal Swab  Result Value Ref Range   SARS Coronavirus 2 NEGATIVE NEGATIVE    Disposition and follow-up:   [name] was discharged from [facility] in stable condition.    Follow-up Appointments:  TBA after pathology review  Signed: Silvano Rusk 11/26/2018, 10:37 AM

## 2018-11-26 NOTE — Progress Notes (Signed)
Patient has discharged to home on 11/26/2018. Discharge instruction including medication and appointment was given to patient. Call his case worker for transportation but she is busy at training meeting so RN paged hospital CM to offer him a taxi voucher. Waiting for a reply.

## 2018-11-26 NOTE — Interval H&P Note (Signed)
History and Physical Interval Note:  11/26/2018 8:45 AM  Anthony Lamb  has presented today for surgery, with the diagnosis of odynophagia UC.  The various methods of treatment have been discussed with the patient and family. After consideration of risks, benefits and other options for treatment, the patient has consented to  Procedure(s): COLONOSCOPY WITH PROPOFOL (N/A) ESOPHAGOGASTRODUODENOSCOPY (EGD) WITH PROPOFOL (N/A) as a surgical intervention.  The patient's history has been reviewed, patient examined, no change in status, stable for surgery.  I have reviewed the patient's chart and labs.  Questions were answered to the patient's satisfaction.     Silvano Rusk

## 2018-11-26 NOTE — Anesthesia Postprocedure Evaluation (Signed)
Anesthesia Post Note  Patient: RYATT CORSINO  Procedure(s) Performed: COLONOSCOPY WITH PROPOFOL (N/A ) ESOPHAGOGASTRODUODENOSCOPY (EGD) WITH PROPOFOL (N/A ) BIOPSY HOT HEMOSTASIS (ARGON PLASMA COAGULATION/BICAP) (N/A ) HEMOSTASIS CLIP PLACEMENT POLYPECTOMY     Patient location during evaluation: PACU Anesthesia Type: MAC Level of consciousness: awake and alert Pain management: pain level controlled Vital Signs Assessment: post-procedure vital signs reviewed and stable Respiratory status: spontaneous breathing, nonlabored ventilation, respiratory function stable and patient connected to nasal cannula oxygen Cardiovascular status: stable and blood pressure returned to baseline Postop Assessment: no apparent nausea or vomiting Anesthetic complications: no    Last Vitals:  Vitals:   11/26/18 1021 11/26/18 1031  BP: (!) 149/76 (!) 158/75  Pulse: 79 79  Resp: (!) 23 (!) 21  Temp:    SpO2: 98% 97%    Last Pain:  Vitals:   11/26/18 1031  TempSrc:   PainSc: 0-No pain                 Lynzee Lindquist S

## 2018-11-26 NOTE — Transfer of Care (Signed)
Immediate Anesthesia Transfer of Care Note  Patient: Anthony Lamb  Procedure(s) Performed: COLONOSCOPY WITH PROPOFOL (N/A ) ESOPHAGOGASTRODUODENOSCOPY (EGD) WITH PROPOFOL (N/A ) BIOPSY HOT HEMOSTASIS (ARGON PLASMA COAGULATION/BICAP) (N/A ) HEMOSTASIS CLIP PLACEMENT  Patient Location: PACU  Anesthesia Type:MAC  Level of Consciousness: awake, alert  and oriented  Airway & Oxygen Therapy: Patient Spontanous Breathing and Patient connected to nasal cannula oxygen  Post-op Assessment: Report given to RN and Post -op Vital signs reviewed and stable  Post vital signs: Reviewed and stable  Last Vitals:  Vitals Value Taken Time  BP    Temp    Pulse    Resp 14 11/26/18 1010  SpO2    Vitals shown include unvalidated device data.  Last Pain:  Vitals:   11/26/18 0837  TempSrc: Oral  PainSc: 0-No pain         Complications: No apparent anesthesia complications

## 2018-11-28 ENCOUNTER — Encounter (HOSPITAL_COMMUNITY): Payer: Self-pay | Admitting: Internal Medicine

## 2018-12-02 ENCOUNTER — Encounter: Payer: Self-pay | Admitting: Internal Medicine

## 2018-12-02 DIAGNOSIS — Z800 Family history of malignant neoplasm of digestive organs: Secondary | ICD-10-CM | POA: Insufficient documentation

## 2018-12-02 NOTE — Progress Notes (Signed)
See prior note  Also needs EGD recall 2 years

## 2018-12-02 NOTE — Progress Notes (Signed)
Hyperplastic and fundic gland gastric polyps Colon biopsies no colitis  Recall colon 5 years hx polyps and FHx CRCA (he actually does not have hx of UC like I thought)  Letter to patient

## 2019-03-15 ENCOUNTER — Emergency Department (HOSPITAL_COMMUNITY)
Admission: EM | Admit: 2019-03-15 | Discharge: 2019-03-15 | Disposition: A | Discharge status: Home / Self Care | Payer: Medicaid Other | Attending: Emergency Medicine | Admitting: Emergency Medicine

## 2019-03-15 ENCOUNTER — Encounter (HOSPITAL_COMMUNITY): Payer: Self-pay | Admitting: Emergency Medicine

## 2019-03-15 ENCOUNTER — Other Ambulatory Visit: Payer: Self-pay

## 2019-03-15 DIAGNOSIS — R5383 Other fatigue: Secondary | ICD-10-CM | POA: Diagnosis present

## 2019-03-15 DIAGNOSIS — Z87891 Personal history of nicotine dependence: Secondary | ICD-10-CM | POA: Diagnosis not present

## 2019-03-15 DIAGNOSIS — I10 Essential (primary) hypertension: Secondary | ICD-10-CM | POA: Diagnosis not present

## 2019-03-15 DIAGNOSIS — Z79899 Other long term (current) drug therapy: Secondary | ICD-10-CM | POA: Diagnosis not present

## 2019-03-15 DIAGNOSIS — F0390 Unspecified dementia without behavioral disturbance: Secondary | ICD-10-CM | POA: Diagnosis not present

## 2019-03-15 LAB — CBC WITH DIFFERENTIAL/PLATELET
Abs Immature Granulocytes: 0.01 10*3/uL (ref 0.00–0.07)
Basophils Absolute: 0 10*3/uL (ref 0.0–0.1)
Basophils Relative: 1 %
Eosinophils Absolute: 0 10*3/uL (ref 0.0–0.5)
Eosinophils Relative: 1 %
HCT: 38.9 % — ABNORMAL LOW (ref 39.0–52.0)
Hemoglobin: 12.7 g/dL — ABNORMAL LOW (ref 13.0–17.0)
Immature Granulocytes: 0 %
Lymphocytes Relative: 14 %
Lymphs Abs: 0.9 10*3/uL (ref 0.7–4.0)
MCH: 29.7 pg (ref 26.0–34.0)
MCHC: 32.6 g/dL (ref 30.0–36.0)
MCV: 90.9 fL (ref 80.0–100.0)
Monocytes Absolute: 0.8 10*3/uL (ref 0.1–1.0)
Monocytes Relative: 13 %
Neutro Abs: 4.6 10*3/uL (ref 1.7–7.7)
Neutrophils Relative %: 71 %
Platelets: 326 10*3/uL (ref 150–400)
RBC: 4.28 MIL/uL (ref 4.22–5.81)
RDW: 13.8 % (ref 11.5–15.5)
WBC: 6.4 10*3/uL (ref 4.0–10.5)
nRBC: 0 % (ref 0.0–0.2)

## 2019-03-15 LAB — COMPREHENSIVE METABOLIC PANEL
ALT: 13 U/L (ref 0–44)
AST: 16 U/L (ref 15–41)
Albumin: 3.9 g/dL (ref 3.5–5.0)
Alkaline Phosphatase: 68 U/L (ref 38–126)
Anion gap: 10 (ref 5–15)
BUN: 6 mg/dL — ABNORMAL LOW (ref 8–23)
CO2: 24 mmol/L (ref 22–32)
Calcium: 8.9 mg/dL (ref 8.9–10.3)
Chloride: 98 mmol/L (ref 98–111)
Creatinine, Ser: 0.73 mg/dL (ref 0.61–1.24)
GFR calc Af Amer: >60 mL/min (ref >60)
GFR calc non Af Amer: >60 mL/min (ref >60)
Glucose, Bld: 123 mg/dL — ABNORMAL HIGH (ref 70–99)
Potassium: 4 mmol/L (ref 3.5–5.1)
Sodium: 132 mmol/L — ABNORMAL LOW (ref 135–145)
Total Bilirubin: 0.4 mg/dL (ref 0.3–1.2)
Total Protein: 7.1 g/dL (ref 6.5–8.1)

## 2019-03-15 LAB — URINALYSIS, ROUTINE W REFLEX MICROSCOPIC
Bilirubin Urine: NEGATIVE
Glucose, UA: NEGATIVE mg/dL
Hgb urine dipstick: NEGATIVE
Ketones, ur: NEGATIVE mg/dL
Leukocytes,Ua: NEGATIVE
Nitrite: NEGATIVE
Protein, ur: NEGATIVE mg/dL
Specific Gravity, Urine: 1.005 (ref 1.005–1.030)
pH: 8 (ref 5.0–8.0)

## 2019-03-15 LAB — LIPASE, BLOOD: Lipase: 23 U/L (ref 11–51)

## 2019-03-15 MED ORDER — LORAZEPAM 2 MG/ML IJ SOLN
1.0000 mg | Freq: Once | INTRAMUSCULAR | Status: AC
  Administered 2019-03-15: 1 mg via INTRAVENOUS
  Filled 2019-03-15: qty 1

## 2019-03-15 MED ORDER — SODIUM CHLORIDE 0.9 % IV BOLUS
1000.0000 mL | Freq: Once | INTRAVENOUS | Status: AC
  Administered 2019-03-15: 1000 mL via INTRAVENOUS

## 2019-03-15 MED ORDER — SODIUM CHLORIDE 0.9 % IV SOLN: INTRAVENOUS | Status: DC

## 2019-03-15 NOTE — ED Notes (Signed)
[facility] called for transport.  

## 2019-03-15 NOTE — ED Notes (Signed)
Patient given water

## 2019-03-15 NOTE — ED Notes (Signed)
Called patient a cab per patient request.

## 2019-03-15 NOTE — ED Provider Notes (Signed)
[facility] DEPT Provider Note   CSN: [id number] Arrival date & time: 03/15/19  1138     History   Chief Complaint Chief Complaint  Patient presents with  . Fatigue    HPI Anthony Lamb is a 63 y.o. male.     63 year old male well-known to me who presents with feeling that he is dehydrated.  States that he has had decreased oral intake after having all of his teeth pulled.  Notes he does not have an appetite.  Just feels weak all over.  Also notes increased anxiety for which I have seen him for multiple times.  Mild midepigastric abdominal pain.  Denies any nausea or vomiting.  No treatment use prior to arrival     Past Medical History:  Diagnosis Date  . Alcohol abuse, in remission   . Anal fissure   . Anemia   . Ankylosing spondylitis ([place])   . Anxiety   . Arthritis   . Chronic diarrhea   . Chronic headaches   . Chronic pain   . Colitis   . Colon polyps   . Depression   . Esophagitis   . Gastric polyps    hyperplastic and fundic gland  . Gastritis   . GERD (gastroesophageal reflux disease)   . Hypertension   . IBS (irritable bowel syndrome)   . Poor dentition   . SIADH (syndrome of inappropriate ADH production) [facility])     Patient Active Problem List   Diagnosis Date Noted  . Family history of colon cancer 12/02/2018  . Gastric polyps   . Schatzki's ring   . Left sided ulcerative colitis without complication ([place])   . Odynophagia 11/25/2018  . Dysphagia 11/25/2018  . Major depressive disorder, recurrent severe without psychotic features ([place]) 06/24/2018  . IBS (irritable bowel syndrome)   . Hypertension   . Colitis   . Adjustment disorder with mixed anxiety and depressed mood 05/11/2017  . Alcohol abuse   . Anxiety   . Gastroesophageal reflux disease   . Osteopenia determined by x-ray 08/06/2014  . Stress fracture of calcaneus 08/06/2014  . Vitamin D deficiency 12/18/2013  . Dementia ([place]) 12/18/2013  . Benign  microscopic hematuria 07/06/2013  . SIADH (syndrome of inappropriate ADH production) ([place]) 06/22/2013  . Ankylosing spondylitis ([place]) 06/20/2013  . Malnutrition of moderate degree ([place]) 06/20/2013  . BPH (benign prostatic hyperplasia) 08/22/2010  . History of colonic polyps 08/13/2008  . Essential hypertension 07/03/2007  . PUD (peptic ulcer disease) 07/03/2007    Past Surgical History:  Procedure Laterality Date  . BIOPSY  11/26/2018   Procedure: BIOPSY;  Surgeon: Gatha Mayer, MD;  Location: [facility];  Service: Endoscopy;;  . COLONOSCOPY W/ BIOPSIES    . COLONOSCOPY WITH PROPOFOL N/A 11/26/2018   Procedure: COLONOSCOPY WITH PROPOFOL;  Surgeon: Gatha Mayer, MD;  Location: [facility];  Service: Endoscopy;  Laterality: N/A;  . ESOPHAGOGASTRODUODENOSCOPY    . ESOPHAGOGASTRODUODENOSCOPY (EGD) WITH PROPOFOL N/A 11/26/2018   Procedure: ESOPHAGOGASTRODUODENOSCOPY (EGD) WITH PROPOFOL;  Surgeon: Gatha Mayer, MD;  Location: [facility];  Service: Endoscopy;  Laterality: N/A;  . HEMOSTASIS CLIP PLACEMENT  11/26/2018   Procedure: HEMOSTASIS CLIP PLACEMENT;  Surgeon: Gatha Mayer, MD;  Location: [facility];  Service: Endoscopy;;  . HERNIA REPAIR Bilateral   . HOT HEMOSTASIS N/A 11/26/2018   Procedure: HOT HEMOSTASIS (ARGON PLASMA COAGULATION/BICAP);  Surgeon: Gatha Mayer, MD;  Location: [facility];  Service: Endoscopy;  Laterality: N/A;  . OPEN REDUCTION  INTERNAL FIXATION (ORIF) DISTAL RADIAL FRACTURE Right 02/11/2018   Procedure: OPEN REDUCTION INTERNAL FIXATION (ORIF) DISTAL RADIAL FRACTURE;  Surgeon: Milly Jakob, MD;  Location: [facility];  Service: Orthopedics;  Laterality: Right;  . POLYPECTOMY  11/26/2018   Procedure: POLYPECTOMY;  Surgeon: Gatha Mayer, MD;  Location: [facility];  Service: Endoscopy;;  . TONSILLECTOMY          Home Medications    Prior to Admission medications   Medication Sig Start Date End Date Taking? Authorizing Provider  acetaminophen  (TYLENOL) 325 MG tablet Take 2 tablets (650 mg total) by mouth every 6 (six) hours as needed for mild pain. (May buy over the counter) 06/27/18  Yes Connye Burkitt, NP  Brexpiprazole (REXULTI) 2 MG TABS Take 2 mg by mouth daily.   Yes [provider]  clonazePAM (KLONOPIN) 1 MG tablet Take 1 mg by mouth 3 (three) times daily.   Yes [provider]  DULoxetine (CYMBALTA) 30 MG capsule Take 90 mg by mouth daily.   Yes [provider]  Eszopiclone (ESZOPICLONE) 3 MG TABS Take 3 mg by mouth at bedtime. Take immediately before bedtime   Yes [provider]  lisinopril (PRINIVIL,ZESTRIL) 10 MG tablet Take 1 tablet (10 mg total) by mouth daily. Patient taking differently: Take 10 mg by mouth at bedtime.  08/21/17 11/21/19 Yes Lacroce, Hulen Shouts, MD  Multiple Vitamin (MULTIVITAMIN WITH MINERALS) TABS tablet Take 1 tablet by mouth daily. (May buy over the counter) 06/28/18  Yes Connye Burkitt, NP  pantoprazole (PROTONIX) 40 MG tablet Take 1 tablet (40 mg total) by mouth daily. 04/24/14  Yes Janith Lima, MD  QUEtiapine (SEROQUEL) 300 MG tablet Take 150 mg by mouth at bedtime.   Yes [provider]  sodium chloride (OCEAN) 0.65 % SOLN nasal spray Place 2 sprays into both nostrils 4 (four) times daily as needed for congestion.    Yes [provider]  sucralfate (CARAFATE) 1 G tablet Take 1 tablet (1 g total) by mouth 4 (four) times daily -  with meals and at bedtime. Chew before swallowing Patient taking differently: Take 1 g by mouth 4 (four) times daily. Chew before swallowing 12/17/13  Yes Janith Lima, MD  tiZANidine (ZANAFLEX) 4 MG tablet Take 2 mg by mouth 3 (three) times daily.   Yes [provider]  verapamil (CALAN-SR) 240 MG CR tablet Take 240 mg by mouth at bedtime. 02/09/17  Yes [provider]    Family History Family History  Problem Relation Age of Onset  . Anxiety disorder Mother   . Congestive Heart Failure Mother   .  Crohn's disease Mother   . Colon cancer Mother 29  . Arthritis Father   . High blood pressure Father   . Crohn's disease Father     Social History Social History   Tobacco Use  . Smoking status: Former Smoker    Types: Cigarettes    Quit date: 2014    Years since quitting: 6.7  . Smokeless tobacco: Never Used  Substance Use Topics  . Alcohol use: Yes    Comment: 1 25 oz of beer a night  . Drug use: No     Allergies   Diphenhydramine hcl, Flexeril [cyclobenzaprine], Hctz [hydrochlorothiazide], Nsaids, Gabapentin, and Hydroxyzine   Review of Systems Review of Systems  All other systems reviewed and are negative.    Physical Exam Updated Vital Signs BP 139/87   Pulse 93   Temp 99.1 F (  37.3 C) (Oral)   Resp 18   SpO2 99%   Physical Exam Vitals signs and nursing note reviewed.  Constitutional:      General: He is not in acute distress.    Appearance: Normal appearance. He is well-developed. He is not toxic-appearing.  HENT:     Head: Normocephalic and atraumatic.  Eyes:     General: Lids are normal.     Conjunctiva/sclera: Conjunctivae normal.     Pupils: Pupils are equal, round, and reactive to light.  Neck:     Musculoskeletal: Normal range of motion and neck supple.     Thyroid: No thyroid mass.     Trachea: No tracheal deviation.  Cardiovascular:     Rate and Rhythm: Normal rate and regular rhythm.     Heart sounds: Normal heart sounds. No murmur. No gallop.   Pulmonary:     Effort: Pulmonary effort is normal. No respiratory distress.     Breath sounds: Normal breath sounds. No stridor. No decreased breath sounds, wheezing, rhonchi or rales.  Abdominal:     General: Bowel sounds are normal. There is no distension.     Palpations: Abdomen is soft.     Tenderness: There is no abdominal tenderness. There is no rebound.  Musculoskeletal: Normal range of motion.        General: No tenderness.  Skin:    General: Skin is warm and dry.     Findings: No  abrasion or rash.  Neurological:     Mental Status: He is alert and oriented to person, place, and time.     GCS: GCS eye subscore is 4. GCS verbal subscore is 5. GCS motor subscore is 6.     Cranial Nerves: No cranial nerve deficit.     Sensory: No sensory deficit.  Psychiatric:        Attention and Perception: Attention normal.        Mood and Affect: Mood is anxious.        Speech: Speech normal.        Behavior: Behavior normal.      ED Treatments / Results  Labs (all labs ordered are listed, but only abnormal results are displayed) Labs Reviewed  CBC WITH DIFFERENTIAL/PLATELET - Abnormal; Notable for the following components:      Result Value   Hemoglobin 12.7 (*)    HCT 38.9 (*)    All other components within normal limits  COMPREHENSIVE METABOLIC PANEL - Abnormal; Notable for the following components:   Sodium 132 (*)    Glucose, Bld 123 (*)    BUN 6 (*)    All other components within normal limits  LIPASE, BLOOD  URINALYSIS, ROUTINE W REFLEX MICROSCOPIC    EKG None  Radiology No results found.  Procedures Procedures (including critical care time)  Medications Ordered in ED Medications  0.9 %  sodium chloride infusion (has no administration in time range)  sodium chloride 0.9 % bolus 1,000 mL (1,000 mLs Intravenous New Bag/Given (Non-Interop) 03/15/19 1221)  LORazepam (ATIVAN) injection 1 mg (1 mg Intravenous Given 03/15/19 1252)     Initial Impression / Assessment and Plan / ED Course  I have reviewed the triage vital signs and the nursing notes.  Pertinent labs & imaging results that were available during my care of the patient were reviewed by me and considered in my medical decision making (see chart for details).        Patient given IV fluids as well as Ativan here  and feels better.  Vitals reviewed and are reassuring and patient stable for discharge  Final Clinical Impressions(s) / ED Diagnoses   Final diagnoses:  None    ED Discharge  Orders    None       Lacretia Leigh, MD 03/15/19 1358

## 2019-03-15 NOTE — ED Triage Notes (Addendum)
Per EMS, patient from home, c/o difficulty eating with poor PO intake since having all teeth pulled x3 weeks ago. C/o headache x3 days. Has ensure and oatmeal at home. States "I think I am dehydrated." Ambulatory.  CBG 173

## 2019-03-15 NOTE — ED Notes (Signed)
Patient given urinal and made aware urine sample is needed.

## 2019-10-13 ENCOUNTER — Telehealth: Payer: Self-pay | Admitting: Adult Health

## 2019-10-13 NOTE — Telephone Encounter (Signed)
I connected by phone with Anthony Lamb and/or patient's caregiver on 10/13/2019 at 9:55 AM to discuss the potential vaccination through our Homebound vaccination initiative.   He qualifies but wants to think about it for a while and call us back .   Anthony Dinunzio NP -C  10/13/2019 9:55 AM

## 2019-10-20 ENCOUNTER — Telehealth: Payer: Self-pay | Admitting: Physician Assistant

## 2019-10-20 NOTE — Telephone Encounter (Signed)
Called to discuss the homebound Covid-19 vaccination initiative with the patient and/or caregiver.   Pt still thinking about it. Gave him our number to call us back if he wants it.   Angelena Form PA-C  [facility]

## 2019-11-07 ENCOUNTER — Emergency Department (HOSPITAL_COMMUNITY): Payer: Medicaid Other

## 2019-11-07 ENCOUNTER — Emergency Department (HOSPITAL_COMMUNITY)
Admission: EM | Admit: 2019-11-07 | Discharge: 2019-11-07 | Disposition: A | Discharge status: Home / Self Care | Payer: Medicaid Other | Attending: Emergency Medicine | Admitting: Emergency Medicine

## 2019-11-07 ENCOUNTER — Encounter (HOSPITAL_COMMUNITY): Payer: Self-pay | Admitting: Emergency Medicine

## 2019-11-07 DIAGNOSIS — R5383 Other fatigue: Secondary | ICD-10-CM | POA: Diagnosis not present

## 2019-11-07 DIAGNOSIS — Z79899 Other long term (current) drug therapy: Secondary | ICD-10-CM | POA: Insufficient documentation

## 2019-11-07 DIAGNOSIS — I10 Essential (primary) hypertension: Secondary | ICD-10-CM | POA: Insufficient documentation

## 2019-11-07 DIAGNOSIS — R109 Unspecified abdominal pain: Secondary | ICD-10-CM | POA: Diagnosis not present

## 2019-11-07 DIAGNOSIS — Z87891 Personal history of nicotine dependence: Secondary | ICD-10-CM | POA: Insufficient documentation

## 2019-11-07 DIAGNOSIS — Z20822 Contact with and (suspected) exposure to covid-19: Secondary | ICD-10-CM | POA: Diagnosis not present

## 2019-11-07 DIAGNOSIS — R110 Nausea: Secondary | ICD-10-CM | POA: Insufficient documentation

## 2019-11-07 LAB — URINALYSIS, ROUTINE W REFLEX MICROSCOPIC
Bacteria, UA: NONE SEEN
Bilirubin Urine: NEGATIVE
Glucose, UA: NEGATIVE mg/dL
Ketones, ur: NEGATIVE mg/dL
Leukocytes,Ua: NEGATIVE
Nitrite: NEGATIVE
Protein, ur: NEGATIVE mg/dL
Specific Gravity, Urine: 1.003 — ABNORMAL LOW (ref 1.005–1.030)
pH: 7 (ref 5.0–8.0)

## 2019-11-07 LAB — COMPREHENSIVE METABOLIC PANEL
ALT: 26 U/L (ref 0–44)
AST: 55 U/L — ABNORMAL HIGH (ref 15–41)
Albumin: 4.5 g/dL (ref 3.5–5.0)
Alkaline Phosphatase: 83 U/L (ref 38–126)
Anion gap: 10 (ref 5–15)
BUN: 7 mg/dL — ABNORMAL LOW (ref 8–23)
CO2: 26 mmol/L (ref 22–32)
Calcium: 9.2 mg/dL (ref 8.9–10.3)
Chloride: 100 mmol/L (ref 98–111)
Creatinine, Ser: 0.76 mg/dL (ref 0.61–1.24)
GFR calc Af Amer: >60 mL/min (ref >60)
GFR calc non Af Amer: >60 mL/min (ref >60)
Glucose, Bld: 99 mg/dL (ref 70–99)
Potassium: 4.4 mmol/L (ref 3.5–5.1)
Sodium: 136 mmol/L (ref 135–145)
Total Bilirubin: 0.5 mg/dL (ref 0.3–1.2)
Total Protein: 8 g/dL (ref 6.5–8.1)

## 2019-11-07 LAB — CBC
HCT: 42.8 % (ref 39.0–52.0)
Hemoglobin: 14.2 g/dL (ref 13.0–17.0)
MCH: 30.3 pg (ref 26.0–34.0)
MCHC: 33.2 g/dL (ref 30.0–36.0)
MCV: 91.3 fL (ref 80.0–100.0)
Platelets: 310 10*3/uL (ref 150–400)
RBC: 4.69 MIL/uL (ref 4.22–5.81)
RDW: 14.1 % (ref 11.5–15.5)
WBC: 9.3 10*3/uL (ref 4.0–10.5)
nRBC: 0 % (ref 0.0–0.2)

## 2019-11-07 LAB — TROPONIN I (HIGH SENSITIVITY): Troponin I (High Sensitivity): 3 ng/L (ref <18)

## 2019-11-07 LAB — SARS CORONAVIRUS 2 BY RT PCR (HOSPITAL ORDER, PERFORMED IN ~~LOC~~ HOSPITAL LAB): SARS Coronavirus 2: NEGATIVE

## 2019-11-07 LAB — LIPASE, BLOOD: Lipase: 25 U/L (ref 11–51)

## 2019-11-07 MED ORDER — LORAZEPAM 2 MG/ML IJ SOLN
1.0000 mg | Freq: Once | INTRAMUSCULAR | Status: AC
  Administered 2019-11-07: 1 mg via INTRAVENOUS
  Filled 2019-11-07: qty 1

## 2019-11-07 MED ORDER — IOHEXOL 300 MG/ML  SOLN
100.0000 mL | Freq: Once | INTRAMUSCULAR | Status: AC | PRN
  Administered 2019-11-07: 100 mL via INTRAVENOUS

## 2019-11-07 MED ORDER — THIAMINE HCL 100 MG/ML IJ SOLN
100.0000 mg | Freq: Once | INTRAMUSCULAR | Status: AC
  Administered 2019-11-07: 100 mg via INTRAVENOUS
  Filled 2019-11-07: qty 2

## 2019-11-07 MED ORDER — LORAZEPAM 2 MG/ML IJ SOLN
0.5000 mg | Freq: Once | INTRAMUSCULAR | Status: AC
  Administered 2019-11-07: 0.5 mg via INTRAVENOUS
  Filled 2019-11-07: qty 1

## 2019-11-07 MED ORDER — SODIUM CHLORIDE (PF) 0.9 % IJ SOLN
INTRAMUSCULAR | Status: AC
  Filled 2019-11-07: qty 50

## 2019-11-07 MED ORDER — SODIUM CHLORIDE 0.9 % IV BOLUS
1000.0000 mL | Freq: Once | INTRAVENOUS | Status: AC
  Administered 2019-11-07: 1000 mL via INTRAVENOUS

## 2019-11-07 MED ORDER — PANTOPRAZOLE SODIUM 40 MG IV SOLR
40.0000 mg | Freq: Once | INTRAVENOUS | Status: AC
  Administered 2019-11-07: 40 mg via INTRAVENOUS
  Filled 2019-11-07: qty 40

## 2019-11-07 MED ORDER — FOLIC ACID 1 MG PO TABS
1.0000 mg | ORAL_TABLET | Freq: Once | ORAL | Status: AC
  Administered 2019-11-07: 1 mg via ORAL
  Filled 2019-11-07: qty 1

## 2019-11-07 NOTE — ED Triage Notes (Signed)
Per EMS-feeling nauseated and weak for 1 week-denies pain

## 2019-11-07 NOTE — ED Provider Notes (Signed)
[facility] DEPT Provider Note   CSN: [id number] Arrival date & time: 11/07/19  0844     History Chief Complaint  Patient presents with  . Nausea  . Fatigue    Anthony Lamb is a 64 y.o. male history of alcohol abuse, ankylosing spondylitis, chronic pain, GERD, gastric polyps, IBS, hypertension, SIADH, UC.  Patient presents today for nausea and generalized weakness x1 week diet describes upper abdominal pain as similar to his chronic abdominal pain described as and aching moderate intensity pain nonradiating worsened with palpation no alleviating factors or radiation of pain.  Reports associated nausea without vomiting pain has been constant for 1 week.  Reports that he feels dehydrated causing the feel generalized weakness, denies focal weakness.  Additionally patient reports he is feeling anxious and has not had his clonazepam today which he takes daily.  Additionally he reports continued alcohol use/drink last night.  He cannot quantify the amount that he drinks daily.  Denies fever, headache, chest pain/shortness of breath, vomiting, diarrhea, blood in the stool, fall/injury  or any additional concerns. HPI     Past Medical History:  Diagnosis Date  . Alcohol abuse, in remission   . Anal fissure   . Anemia   . Ankylosing spondylitis ([place])   . Anxiety   . Arthritis   . Chronic diarrhea   . Chronic headaches   . Chronic pain   . Colitis   . Colon polyps   . Depression   . Esophagitis   . Gastric polyps    hyperplastic and fundic gland  . Gastritis   . GERD (gastroesophageal reflux disease)   . Hypertension   . IBS (irritable bowel syndrome)   . Poor dentition   . SIADH (syndrome of inappropriate ADH production) [facility])     Patient Active Problem List   Diagnosis Date Noted  . Family history of colon cancer 12/02/2018  . Gastric polyps   . Schatzki's ring   . Left sided ulcerative colitis without complication ([place])   .  Odynophagia 11/25/2018  . Dysphagia 11/25/2018  . Major depressive disorder, recurrent severe without psychotic features ([place]) 06/24/2018  . IBS (irritable bowel syndrome)   . Hypertension   . Colitis   . Adjustment disorder with mixed anxiety and depressed mood 05/11/2017  . Alcohol abuse   . Anxiety   . Gastroesophageal reflux disease   . Osteopenia determined by x-ray 08/06/2014  . Stress fracture of calcaneus 08/06/2014  . Vitamin D deficiency 12/18/2013  . Dementia ([place]) 12/18/2013  . Benign microscopic hematuria 07/06/2013  . SIADH (syndrome of inappropriate ADH production) ([place]) 06/22/2013  . Ankylosing spondylitis ([place]) 06/20/2013  . Malnutrition of moderate degree ([place]) 06/20/2013  . BPH (benign prostatic hyperplasia) 08/22/2010  . History of colonic polyps 08/13/2008  . Essential hypertension 07/03/2007  . PUD (peptic ulcer disease) 07/03/2007    Past Surgical History:  Procedure Laterality Date  . BIOPSY  11/26/2018   Procedure: BIOPSY;  Surgeon: Gatha Mayer, MD;  Location: [facility];  Service: Endoscopy;;  . COLONOSCOPY W/ BIOPSIES    . COLONOSCOPY WITH PROPOFOL N/A 11/26/2018   Procedure: COLONOSCOPY WITH PROPOFOL;  Surgeon: Gatha Mayer, MD;  Location: [facility];  Service: Endoscopy;  Laterality: N/A;  . ESOPHAGOGASTRODUODENOSCOPY    . ESOPHAGOGASTRODUODENOSCOPY (EGD) WITH PROPOFOL N/A 11/26/2018   Procedure: ESOPHAGOGASTRODUODENOSCOPY (EGD) WITH PROPOFOL;  Surgeon: Gatha Mayer, MD;  Location: [facility];  Service: Endoscopy;  Laterality: N/A;  . HEMOSTASIS CLIP PLACEMENT  11/26/2018   Procedure: HEMOSTASIS CLIP PLACEMENT;  Surgeon: Gatha Mayer, MD;  Location: [facility];  Service: Endoscopy;;  . HERNIA REPAIR Bilateral   . HOT HEMOSTASIS N/A 11/26/2018   Procedure: HOT HEMOSTASIS (ARGON PLASMA COAGULATION/BICAP);  Surgeon: Gatha Mayer, MD;  Location: [facility];  Service: Endoscopy;  Laterality: N/A;  . OPEN REDUCTION INTERNAL FIXATION  (ORIF) DISTAL RADIAL FRACTURE Right 02/11/2018   Procedure: OPEN REDUCTION INTERNAL FIXATION (ORIF) DISTAL RADIAL FRACTURE;  Surgeon: Milly Jakob, MD;  Location: [facility];  Service: Orthopedics;  Laterality: Right;  . POLYPECTOMY  11/26/2018   Procedure: POLYPECTOMY;  Surgeon: Gatha Mayer, MD;  Location: [facility];  Service: Endoscopy;;  . TONSILLECTOMY         Family History  Problem Relation Age of Onset  . Anxiety disorder Mother   . Congestive Heart Failure Mother   . Crohn's disease Mother   . Colon cancer Mother 19  . Arthritis Father   . High blood pressure Father   . Crohn's disease Father     Social History   Tobacco Use  . Smoking status: Former Smoker    Types: Cigarettes    Quit date: 2014    Years since quitting: 7.4  . Smokeless tobacco: Never Used  Substance Use Topics  . Alcohol use: Yes    Comment: 1 25 oz of beer a night  . Drug use: No    Home Medications Prior to Admission medications   Medication Sig Start Date End Date Taking? Authorizing Provider  acetaminophen (TYLENOL) 325 MG tablet Take 2 tablets (650 mg total) by mouth every 6 (six) hours as needed for mild pain. (May buy over the counter) 06/27/18  Yes Connye Burkitt, NP  Brexpiprazole (REXULTI) 2 MG TABS Take 2 mg by mouth daily.   Yes [provider]  clonazePAM (KLONOPIN) 1 MG tablet Take 1 mg by mouth 3 (three) times daily.   Yes [provider]  DULoxetine (CYMBALTA) 30 MG capsule Take 60 mg by mouth daily.    Yes [provider]  lisinopril (PRINIVIL,ZESTRIL) 10 MG tablet Take 1 tablet (10 mg total) by mouth daily. Patient taking differently: Take 10 mg by mouth at bedtime.  08/21/17 11/21/19 Yes Lacroce, Hulen Shouts, MD  Multiple Vitamin (MULTIVITAMIN WITH MINERALS) TABS tablet Take 1 tablet by mouth daily. (May buy over the counter) 06/28/18  Yes Connye Burkitt, NP  pantoprazole (PROTONIX) 40 MG tablet Take 1 tablet (40 mg total) by mouth daily. 04/24/14  Yes  Janith Lima, MD  propranolol (INDERAL) 10 MG tablet Take 10 mg by mouth in the morning and at bedtime. For tremors 11/04/19  Yes [provider]  QUEtiapine (SEROQUEL) 300 MG tablet Take 300 mg by mouth at bedtime.    Yes [provider]  sodium chloride (OCEAN) 0.65 % SOLN nasal spray Place 2 sprays into both nostrils 4 (four) times daily as needed for congestion.    Yes [provider]  sucralfate (CARAFATE) 1 G tablet Take 1 tablet (1 g total) by mouth 4 (four) times daily -  with meals and at bedtime. Chew before swallowing Patient taking differently: Take 1 g by mouth 4 (four) times daily. Chew before swallowing 12/17/13  Yes Janith Lima, MD  tiZANidine (ZANAFLEX) 4 MG tablet Take 2 mg by mouth 3 (three) times daily.   Yes [provider]  verapamil (CALAN-SR) 240 MG CR tablet Take 240 mg by mouth at bedtime. 02/09/17  Yes [provider]    Allergies    Diphenhydramine hcl, Flexeril [cyclobenzaprine], Hctz [hydrochlorothiazide], Nsaids, Gabapentin, and Hydroxyzine  Review of Systems   Review of Systems Ten systems are reviewed and are negative for acute change except as noted in the HPI  Physical Exam Updated Vital Signs BP (!) 151/81   Pulse 77   Temp 98.4 F (36.9 C) (Oral)   Resp 18   SpO2 98%   Physical Exam Constitutional:      General: He is not in acute distress.    Appearance: Normal appearance. He is well-developed. He is not ill-appearing or diaphoretic.  HENT:     Head: Normocephalic and atraumatic.  Eyes:     General: Vision grossly intact. Gaze aligned appropriately.     Pupils: Pupils are equal, round, and reactive to light.  Neck:     Trachea: Trachea and phonation normal.  Cardiovascular:     Pulses:          Dorsalis pedis pulses are 2+ on the right side and 2+ on the left side.  Pulmonary:     Effort: Pulmonary effort is normal. No respiratory distress.  Abdominal:     General: There is no distension.      Palpations: Abdomen is soft.     Tenderness: There is generalized abdominal tenderness. There is no guarding or rebound.  Musculoskeletal:        General: No tenderness. Normal range of motion.     Cervical back: Normal range of motion.     Right lower leg: No edema.     Left lower leg: No edema.  Feet:     Right foot:     Protective Sensation: 3 sites tested. 3 sites sensed.     Left foot:     Protective Sensation: 3 sites tested. 3 sites sensed.  Skin:    General: Skin is warm and dry.  Neurological:     Mental Status: He is alert.     GCS: GCS eye subscore is 4. GCS verbal subscore is 5. GCS motor subscore is 6.     Comments: Speech is clear and goal oriented, follows commands Major Cranial nerves without deficit, no facial droop Moves extremities without ataxia, coordination intact  Psychiatric:        Behavior: Behavior normal.     ED Results / Procedures / Treatments   Labs (all labs ordered are listed, but only abnormal results are displayed) Labs Reviewed  COMPREHENSIVE METABOLIC PANEL - Abnormal; Notable for the following components:      Result Value   BUN 7 (*)    AST 55 (*)    All other components within normal limits  URINALYSIS, ROUTINE W REFLEX MICROSCOPIC - Abnormal; Notable for the following components:   Color, Urine STRAW (*)    Specific Gravity, Urine 1.003 (*)    Hgb urine dipstick SMALL (*)    All other components within normal limits  SARS CORONAVIRUS 2 BY RT PCR (HOSPITAL ORDER, [facility])  LIPASE, BLOOD  CBC  TROPONIN I (HIGH SENSITIVITY)    EKG EKG Interpretation  Date/Time:  Friday November 07 2019 13:12:44 EDT Ventricular Rate:  74 PR Interval:    QRS Duration: 115 QT Interval:  437 QTC Calculation: 485 R Axis:   25 Text Interpretation: Sinus rhythm Nonspecific intraventricular conduction delay Low voltage, precordial leads No STEMI Confirmed by Octaviano Glow [phone]) on 11/07/2019 1:26:34  PM   Radiology CT ABDOMEN PELVIS  W CONTRAST  Result Date: 11/07/2019 CLINICAL DATA:  Nausea and weakness EXAM: CT ABDOMEN AND PELVIS WITH CONTRAST TECHNIQUE: Multidetector CT imaging of the abdomen and pelvis was performed using the standard protocol following bolus administration of intravenous contrast. CONTRAST:  163m OMNIPAQUE IOHEXOL 300 MG/ML  SOLN COMPARISON:  06/21/2018 FINDINGS: Lower chest: No acute abnormality. Hepatobiliary: No focal liver abnormality is seen. No gallstones, gallbladder wall thickening, or biliary dilatation. Pancreas: Unremarkable. Spleen: Unremarkable. Adrenals/Urinary Tract: Adrenals, kidneys, and bladder are unremarkable. Stomach/Bowel: The stomach is within normal limits. Focal radiopaque density within the lumen presumably reflects ingested contents. Bowel is normal in caliber. Minimal diverticulosis. Normal appendix. Vascular/Lymphatic: Aortic atherosclerosis. No enlarged abdominal or pelvic lymph nodes. Reproductive: Prostate is unremarkable. Other: No free fluid.  Abdominal wall is unremarkable. Musculoskeletal: No acute osseous abnormality. Appearance of spine and sacroiliac joints suggests ankylosing spondylitis. IMPRESSION: No acute abnormality or findings to account for reported symptoms. Stable findings detailed above. Electronically Signed   By: PMacy MisM.D.   On: 11/07/2019 14:19    Procedures Procedures (including critical care time)  Medications Ordered in ED Medications  sodium chloride (PF) 0.9 % injection (has no administration in time range)  LORazepam (ATIVAN) injection 0.5 mg (0.5 mg Intravenous Given 11/07/19 1315)  sodium chloride 0.9 % bolus 1,000 mL (0 mLs Intravenous Stopped 11/07/19 1528)  pantoprazole (PROTONIX) injection 40 mg (40 mg Intravenous Given 11/07/19 1317)  iohexol (OMNIPAQUE) 300 MG/ML solution 100 mL (100 mLs Intravenous Contrast Given 63/8/4616599  folic acid (FOLVITE) tablet 1 mg (1 mg Oral Given 11/07/19 1528)  thiamine  (B-1) injection 100 mg (100 mg Intravenous Given 11/07/19 1529)  LORazepam (ATIVAN) injection 1 mg (1 mg Intravenous Given 11/07/19 1531)    ED Course  I have reviewed the triage vital signs and the nursing notes.  Pertinent labs & imaging results that were available during my care of the patient were reviewed by me and considered in my medical decision making (see chart for details).  Clinical Course as of Nov 06 1717  Fri Nov 07, 2019  1556 63yo male w/ daily etoh consumption presenting to ED with nausea and fatigue for 1 week.  He has a hx of UC and gastric ulcer and esophageal stricture, which cause chronic epigastric pain, but feels he has a different pain this week, along with nausea.  He feels it radiating into his neck.  He drinks 2 large beers daily (24 ounces?) and does not feel like he is withdrawing, but says he is unsure.  Tremulous on exam.  He is eating crackers.  His labs notable for normal UA, unremarkable BMP, lipase and CBC.  Covid test pending.  ECG per my interpretation with NSR, no ischemic changes.  We'll add on a troponin.  CT abdomen with no acute inflammatory or infectious findings. If troponin is negative, I suspect this may be withdrawal related to simply an exacerbation of his chronic GI issues, and he will be discharged.   [MT]  1600 SARS Coronavirus 2: NEGATIVE [MT]  13570Trop negative can dishcarge   [MT]    Clinical Course User Index [MT] Trifan, MCarola Rhine MD   MDM Rules/Calculators/A&P                      Additional History Obtained: 1. Nursing notes from this visit. 2. Prior ED visit on March 15, 2019, patient diagnosed with fatigue.  Felt better after IV fluids and Ativan.  Labs were reassuring  and he was discharged  I ordered, reviewed and interpreted labs which include: CBC within normal limits, no evidence of anemia and no leukocytosis to suggest infection Lipase within normal limits no evidence of pancreatitis Urinalysis shows no evidence of  infection, small hemoglobin similar to prior. CMP shows no emergent electrolyte derangement, evidence of acute kidney injury, emergent LFT elevation or gap. Covid test negative High sensitive troponin negative, no indication for delta troponin with ongoing symptoms x1 week  EKG: Sinus rhythm Nonspecific intraventricular conduction delay Low voltage, precordial leads No STEMI Confirmed by Octaviano Glow [phone]) on 11/07/2019 1:26:34 PM  CTAP:  IMPRESSION:  No acute abnormality or findings to account for reported symptoms.  Stable findings detailed above.  - Patient symptoms improved following IV Protonix, Ativan and fluids.  On reassessment he is well-appearing and in no acute distress.  Patient is a daily drinker which may be contributing to his symptoms likely reflux/gastritis.  He is on Protonix and Carafate at home which he is encouraged to continue in addition to slowly cutting back on alcohol use.  Patient was seen and evaluated by Dr. Langston Masker, as patient is on Klonopin daily will avoid prescription of Librium.  Additionally concern patient may continue drinking at home so will avoid additional benzos.  Patient is does not appear to be intoxicated, fully alert and oriented.  On reassessment patient is sitting up comfortably no acute distress and is requesting discharge.  He does not appear to be in acute withdrawal at this time he does not need any refills of his medications he appears stable for discharge.  No evidence perforation, ACS, dissection or other emergent pathologies at this time patient has normal vital signs well-appearing and in no acute distress.  He plans on calling his primary care doctor for a follow-up visit.  Patient was informed of incidental findings today and will discuss with PCP at follow-up visit.  At this time there does not appear to be any evidence of an acute emergency medical condition and the patient appears stable for discharge with appropriate outpatient follow  up. Diagnosis was discussed with patient who verbalizes understanding of care plan and is agreeable to discharge. I have discussed return precautions with patient who verbalizes understanding. Patient encouraged to follow-up with their PCP. All questions answered.  Patient seen and evaluated by Dr. Langston Masker who agrees with plan to discharge with follow-up.   Note: Portions of this report may have been transcribed using voice recognition software. Every effort was made to ensure accuracy; however, inadvertent computerized transcription errors may still be present. Final Clinical Impression(s) / ED Diagnoses Final diagnoses:  Nausea  Fatigue, unspecified type    Rx / DC Orders ED Discharge Orders    None       Gari Crown 11/07/19 1719    Wyvonnia Dusky, MD 11/08/19 [phone]

## 2019-11-07 NOTE — Discharge Instructions (Addendum)
At this time there does not appear to be the presence of an emergent medical condition, however there is always the potential for conditions to change. Please read and follow the below instructions.  Please return to the Emergency Department immediately for any new or worsening symptoms. Please be sure to follow up with your Primary Care Provider within one week regarding your visit today; please call their office to schedule an appointment even if you are feeling better for a follow-up visit. Please drink enough water, get plenty of rest and eat healthy foods. Your CT scan today showed atherosclerosis, diverticulosis and possible ankylosing spondylitis.  Discuss these incidental findings with your primary care provider at your follow-up visit. Continue taking your Carafate and Protonix as prescribed.  Get help right away if: You have thoughts about hurting yourself or others. You have serious withdrawal symptoms, including: Confusion. Racing heart. High blood pressure. Fever. You have pain in your chest, neck, arm, or jaw. You feel very weak or you pass out (faint). You have throw up that is bright red or looks like coffee grounds. You have bloody or black poop (stools) or poop that looks like tar. You have a very bad headache, a stiff neck, or both. You have very bad pain, cramping, or bloating in your belly (abdomen). You have trouble breathing or you are breathing very quickly. Your heart is beating very quickly. Your skin feels cold and clammy. You feel confused. You have signs of losing too much water in your body, such as: Dark pee, very little pee, or no pee. Cracked lips. Dry mouth. Sunken eyes. Sleepiness. Weakness. You have any new/concerning or worsening of symptoms  Please read the additional information packets attached to your discharge summary.  Do not take your medicine if  develop an itchy rash, swelling in your mouth or lips, or difficulty breathing; call 911  and seek immediate emergency medical attention if this occurs.  Note: Portions of this text may have been transcribed using voice recognition software. Every effort was made to ensure accuracy; however, inadvertent computerized transcription errors may still be present.

## 2019-11-18 ENCOUNTER — Other Ambulatory Visit: Payer: Self-pay

## 2019-11-18 ENCOUNTER — Emergency Department (HOSPITAL_COMMUNITY)
Admission: EM | Admit: 2019-11-18 | Discharge: 2019-11-18 | Discharge status: Against Medical Advice | Payer: Medicaid Other | Attending: Emergency Medicine | Admitting: Emergency Medicine

## 2019-11-18 ENCOUNTER — Encounter (HOSPITAL_COMMUNITY): Payer: Self-pay | Admitting: Emergency Medicine

## 2019-11-18 ENCOUNTER — Emergency Department (HOSPITAL_COMMUNITY): Payer: Medicaid Other

## 2019-11-18 DIAGNOSIS — Z5321 Procedure and treatment not carried out due to patient leaving prior to being seen by health care provider: Secondary | ICD-10-CM | POA: Diagnosis not present

## 2019-11-18 DIAGNOSIS — R531 Weakness: Secondary | ICD-10-CM | POA: Insufficient documentation

## 2019-11-18 LAB — CBC
HCT: 42.6 % (ref 39.0–52.0)
Hemoglobin: 13.9 g/dL (ref 13.0–17.0)
MCH: 29.3 pg (ref 26.0–34.0)
MCHC: 32.6 g/dL (ref 30.0–36.0)
MCV: 89.9 fL (ref 80.0–100.0)
Platelets: 346 10*3/uL (ref 150–400)
RBC: 4.74 MIL/uL (ref 4.22–5.81)
RDW: 13.7 % (ref 11.5–15.5)
WBC: 9.4 10*3/uL (ref 4.0–10.5)
nRBC: 0 % (ref 0.0–0.2)

## 2019-11-18 LAB — COMPREHENSIVE METABOLIC PANEL
ALT: 19 U/L (ref 0–44)
AST: 22 U/L (ref 15–41)
Albumin: 4.1 g/dL (ref 3.5–5.0)
Alkaline Phosphatase: 77 U/L (ref 38–126)
Anion gap: 10 (ref 5–15)
BUN: 5 mg/dL — ABNORMAL LOW (ref 8–23)
CO2: 25 mmol/L (ref 22–32)
Calcium: 9.3 mg/dL (ref 8.9–10.3)
Chloride: 100 mmol/L (ref 98–111)
Creatinine, Ser: 0.81 mg/dL (ref 0.61–1.24)
GFR calc Af Amer: >60 mL/min (ref >60)
GFR calc non Af Amer: >60 mL/min (ref >60)
Glucose, Bld: 107 mg/dL — ABNORMAL HIGH (ref 70–99)
Potassium: 4.1 mmol/L (ref 3.5–5.1)
Sodium: 135 mmol/L (ref 135–145)
Total Bilirubin: 0.5 mg/dL (ref 0.3–1.2)
Total Protein: 7.6 g/dL (ref 6.5–8.1)

## 2019-11-18 LAB — LIPASE, BLOOD: Lipase: 26 U/L (ref 11–51)

## 2019-11-18 MED ORDER — SODIUM CHLORIDE 0.9% FLUSH: 3.0000 mL | Freq: Once | INTRAVENOUS | Status: DC

## 2019-11-18 NOTE — ED Triage Notes (Signed)
EMS stated, coming from home, generalized weakness, lack of appetite and oral intake over 3-4 days.  No ulcer medicine due to lack of getting it refill. LUQ pain

## 2019-11-18 NOTE — ED Triage Notes (Signed)
20g left forearm.

## 2019-11-18 NOTE — ED Notes (Signed)
Pt name called for room, no response. Not in lobby, restroom or outside

## 2019-11-21 ENCOUNTER — Other Ambulatory Visit: Payer: Self-pay

## 2019-11-21 ENCOUNTER — Emergency Department (HOSPITAL_COMMUNITY)
Admission: EM | Admit: 2019-11-21 | Discharge: 2019-11-21 | Disposition: A | Discharge status: Home / Self Care | Payer: Medicaid Other | Attending: Emergency Medicine | Admitting: Emergency Medicine

## 2019-11-21 ENCOUNTER — Encounter (HOSPITAL_COMMUNITY): Payer: Self-pay | Admitting: *Deleted

## 2019-11-21 DIAGNOSIS — F419 Anxiety disorder, unspecified: Secondary | ICD-10-CM | POA: Insufficient documentation

## 2019-11-21 DIAGNOSIS — Z889 Allergy status to unspecified drugs, medicaments and biological substances status: Secondary | ICD-10-CM | POA: Diagnosis not present

## 2019-11-21 DIAGNOSIS — Z886 Allergy status to analgesic agent status: Secondary | ICD-10-CM | POA: Insufficient documentation

## 2019-11-21 DIAGNOSIS — R1013 Epigastric pain: Secondary | ICD-10-CM

## 2019-11-21 DIAGNOSIS — Z79899 Other long term (current) drug therapy: Secondary | ICD-10-CM | POA: Diagnosis not present

## 2019-11-21 DIAGNOSIS — Z882 Allergy status to sulfonamides status: Secondary | ICD-10-CM | POA: Insufficient documentation

## 2019-11-21 DIAGNOSIS — Z87891 Personal history of nicotine dependence: Secondary | ICD-10-CM | POA: Insufficient documentation

## 2019-11-21 DIAGNOSIS — I10 Essential (primary) hypertension: Secondary | ICD-10-CM | POA: Insufficient documentation

## 2019-11-21 LAB — COMPREHENSIVE METABOLIC PANEL
ALT: 22 U/L (ref 0–44)
AST: 25 U/L (ref 15–41)
Albumin: 4.7 g/dL (ref 3.5–5.0)
Alkaline Phosphatase: 82 U/L (ref 38–126)
Anion gap: 12 (ref 5–15)
BUN: 7 mg/dL — ABNORMAL LOW (ref 8–23)
CO2: 26 mmol/L (ref 22–32)
Calcium: 9.3 mg/dL (ref 8.9–10.3)
Chloride: 97 mmol/L — ABNORMAL LOW (ref 98–111)
Creatinine, Ser: 0.81 mg/dL (ref 0.61–1.24)
GFR calc Af Amer: >60 mL/min (ref >60)
GFR calc non Af Amer: >60 mL/min (ref >60)
Glucose, Bld: 96 mg/dL (ref 70–99)
Potassium: 4 mmol/L (ref 3.5–5.1)
Sodium: 135 mmol/L (ref 135–145)
Total Bilirubin: 0.8 mg/dL (ref 0.3–1.2)
Total Protein: 8.5 g/dL — ABNORMAL HIGH (ref 6.5–8.1)

## 2019-11-21 LAB — LIPASE, BLOOD: Lipase: 31 U/L (ref 11–51)

## 2019-11-21 LAB — CBC
HCT: 43.6 % (ref 39.0–52.0)
Hemoglobin: 14.2 g/dL (ref 13.0–17.0)
MCH: 29.8 pg (ref 26.0–34.0)
MCHC: 32.6 g/dL (ref 30.0–36.0)
MCV: 91.6 fL (ref 80.0–100.0)
Platelets: 341 10*3/uL (ref 150–400)
RBC: 4.76 MIL/uL (ref 4.22–5.81)
RDW: 13.7 % (ref 11.5–15.5)
WBC: 7.4 10*3/uL (ref 4.0–10.5)
nRBC: 0 % (ref 0.0–0.2)

## 2019-11-21 LAB — TYPE AND SCREEN
ABO/RH(D): A POS
Antibody Screen: NEGATIVE

## 2019-11-21 LAB — ABO/RH: ABO/RH(D): A POS

## 2019-11-21 MED ORDER — PROMETHAZINE HCL 25 MG/ML IJ SOLN: 25.0000 mg | Freq: Once | INTRAMUSCULAR | Status: DC

## 2019-11-21 MED ORDER — SODIUM CHLORIDE 0.9 % IV BOLUS
1000.0000 mL | Freq: Once | INTRAVENOUS | Status: AC
  Administered 2019-11-21: 1000 mL via INTRAVENOUS

## 2019-11-21 MED ORDER — LORAZEPAM 2 MG/ML IJ SOLN
1.0000 mg | Freq: Once | INTRAMUSCULAR | Status: AC
  Administered 2019-11-21: 1 mg via INTRAVENOUS
  Filled 2019-11-21: qty 1

## 2019-11-21 MED ORDER — ONDANSETRON HCL 4 MG/2ML IJ SOLN
4.0000 mg | Freq: Once | INTRAMUSCULAR | Status: AC
  Administered 2019-11-21: 4 mg via INTRAVENOUS
  Filled 2019-11-21: qty 2

## 2019-11-21 MED ORDER — SUCRALFATE 1 G PO TABS
1.0000 g | ORAL_TABLET | Freq: Once | ORAL | Status: AC
  Administered 2019-11-21: 1 g via ORAL
  Filled 2019-11-21: qty 1

## 2019-11-21 MED ORDER — ALUM & MAG HYDROXIDE-SIMETH 200-200-20 MG/5ML PO SUSP
30.0000 mL | Freq: Once | ORAL | Status: AC
  Administered 2019-11-21: 30 mL via ORAL
  Filled 2019-11-21: qty 30

## 2019-11-21 MED ORDER — FAMOTIDINE IN NACL 20-0.9 MG/50ML-% IV SOLN
20.0000 mg | Freq: Once | INTRAVENOUS | Status: AC
  Administered 2019-11-21: 20 mg via INTRAVENOUS
  Filled 2019-11-21: qty 50

## 2019-11-21 NOTE — ED Provider Notes (Signed)
[facility] DEPT Provider Note   CSN: [id number] Arrival date & time: 11/21/19  0300     History Chief Complaint  Patient presents with  . Abdominal Pain    Anthony Lamb is a 64 y.o. male with history of ETOH abuse, chronic GI issues who presents with abdominal pain.  Patient states that he has had a flare of his abdominal pain due to ulcers over the past week.  He states he takes Protonix and Carafate but ran out of his Carafate and hasn't been able to get to his mailbox which is up the street to get the refill.  He denies any alcohol use in the past several weeks.  He states that he feels dehydrated and shaky because he has not been able to orally hydrate due to increased pain and not having teeth.  He denies fever, chills, chest pain, shortness of breath, nausea, vomiting, blood in the stool.  States he has not had a bowel movement a couple of days.  He came to the ED a couple days ago but left without being seen due to prolonged wait times. His GI doctor is Dr. Carlean Purl  HPI   Past Medical History:  Diagnosis Date  . Alcohol abuse, in remission   . Anal fissure   . Anemia   . Ankylosing spondylitis ([place])   . Anxiety   . Arthritis   . Chronic diarrhea   . Chronic headaches   . Chronic pain   . Colitis   . Colon polyps   . Depression   . Esophagitis   . Gastric polyps    hyperplastic and fundic gland  . Gastritis   . GERD (gastroesophageal reflux disease)   . Hypertension   . IBS (irritable bowel syndrome)   . Poor dentition   . SIADH (syndrome of inappropriate ADH production) [facility])     Patient Active Problem List   Diagnosis Date Noted  . Family history of colon cancer 12/02/2018  . Gastric polyps   . Schatzki's ring   . Left sided ulcerative colitis without complication ([place])   . Odynophagia 11/25/2018  . Dysphagia 11/25/2018  . 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 IBS  (irritable bowel syndrome)   . Hypertension   . Colitis   . Adjustment disorder with mixed anxiety and depressed mood 05/11/2017  . Alcohol abuse   . Anxiety   . Gastroesophageal reflux disease   . Osteopenia determined by x-ray 08/06/2014  . Stress fracture of calcaneus 08/06/2014  . Vitamin D deficiency 12/18/2013  . Dementia ([place]) 12/18/2013  . Benign microscopic hematuria 07/06/2013  . SIADH (syndrome of inappropriate ADH production) ([place]) 06/22/2013  . Ankylosing spondylitis ([place]) 06/20/2013  . Malnutrition of moderate degree ([place]) 06/20/2013  . BPH (benign prostatic hyperplasia) 08/22/2010  . History of colonic polyps 08/13/2008  . Essential hypertension 07/03/2007  . PUD (peptic ulcer disease) 07/03/2007    Past Surgical History:  Procedure Laterality Date  . BIOPSY  11/26/2018   Procedure: BIOPSY;  Surgeon: Gatha Mayer, MD;  Location: [facility];  Service: Endoscopy;;  . COLONOSCOPY W/ BIOPSIES    . COLONOSCOPY WITH PROPOFOL N/A 11/26/2018   Procedure: COLONOSCOPY WITH PROPOFOL;  Surgeon: Gatha Mayer, MD;  Location: [facility];  Service: Endoscopy;  Laterality: N/A;  . ESOPHAGOGASTRODUODENOSCOPY    . ESOPHAGOGASTRODUODENOSCOPY (EGD) WITH PROPOFOL N/A 11/26/2018   Procedure: ESOPHAGOGASTRODUODENOSCOPY (EGD) WITH PROPOFOL;  Surgeon: Gatha Mayer, MD;  Location:  [facility];  Service: Endoscopy;  Laterality: N/A;  . HEMOSTASIS CLIP PLACEMENT  11/26/2018   Procedure: HEMOSTASIS CLIP PLACEMENT;  Surgeon: Gatha Mayer, MD;  Location: [facility];  Service: Endoscopy;;  . HERNIA REPAIR Bilateral   . HOT HEMOSTASIS N/A 11/26/2018   Procedure: HOT HEMOSTASIS (ARGON PLASMA COAGULATION/BICAP);  Surgeon: Gatha Mayer, MD;  Location: [facility];  Service: Endoscopy;  Laterality: N/A;  . OPEN REDUCTION INTERNAL FIXATION (ORIF) DISTAL RADIAL FRACTURE Right 02/11/2018   Procedure: OPEN REDUCTION INTERNAL FIXATION (ORIF) DISTAL RADIAL FRACTURE;  Surgeon: Milly Jakob,  MD;  Location: [facility];  Service: Orthopedics;  Laterality: Right;  . POLYPECTOMY  11/26/2018   Procedure: POLYPECTOMY;  Surgeon: Gatha Mayer, MD;  Location: [facility];  Service: Endoscopy;;  . TONSILLECTOMY         Family History  Problem Relation Age of Onset  . Anxiety disorder Mother   . Congestive Heart Failure Mother   . Crohn's disease Mother   . Colon cancer Mother 92  . Arthritis Father   . High blood pressure Father   . Crohn's disease Father     Social History   Tobacco Use  . Smoking status: Former Smoker    Types: Cigarettes    Quit date: 2014    Years since quitting: 7.4  . Smokeless tobacco: Never Used  Vaping Use  . Vaping Use: Never used  Substance Use Topics  . Alcohol use: Yes    Comment: 1 25 oz of beer a night  . Drug use: No    Home Medications Prior to Admission medications   Medication Sig Start Date End Date Taking? Authorizing Provider  acetaminophen (TYLENOL) 325 MG tablet Take 2 tablets (650 mg total) by mouth every 6 (six) hours as needed for mild pain. (May buy over the counter) 06/27/18   Connye Burkitt, NP  Brexpiprazole (REXULTI) 2 MG TABS Take 2 mg by mouth daily.    [provider]  clonazePAM (KLONOPIN) 1 MG tablet Take 1 mg by mouth 3 (three) times daily.    [provider]  DULoxetine (CYMBALTA) 30 MG capsule Take 60 mg by mouth daily.     [provider]  lisinopril (PRINIVIL,ZESTRIL) 10 MG tablet Take 1 tablet (10 mg total) by mouth daily. Patient taking differently: Take 10 mg by mouth at bedtime.  08/21/17 11/21/19  Melanee Spry, MD  Multiple Vitamin (MULTIVITAMIN WITH MINERALS) TABS tablet Take 1 tablet by mouth daily. (May buy over the counter) 06/28/18   Connye Burkitt, NP  pantoprazole (PROTONIX) 40 MG tablet Take 1 tablet (40 mg total) by mouth daily. 04/24/14   Janith Lima, MD  propranolol (INDERAL) 10 MG tablet Take 10 mg by mouth in the morning and at bedtime. For tremors 11/04/19    [provider]  QUEtiapine (SEROQUEL) 300 MG tablet Take 300 mg by mouth at bedtime.     [provider]  sodium chloride (OCEAN) 0.65 % SOLN nasal spray Place 2 sprays into both nostrils 4 (four) times daily as needed for congestion.     [provider]  sucralfate (CARAFATE) 1 G tablet Take 1 tablet (1 g total) by mouth 4 (four) times daily -  with meals and at bedtime. Chew before swallowing Patient taking differently: Take 1 g by mouth 4 (four) times daily. Chew before swallowing 12/17/13   Janith Lima, MD  tiZANidine (ZANAFLEX) 4 MG tablet Take 2 mg by mouth 3 (three) times  daily.    [provider]  verapamil (CALAN-SR) 240 MG CR tablet Take 240 mg by mouth at bedtime. 02/09/17   [provider]    Allergies    Diphenhydramine hcl, Flexeril [cyclobenzaprine], Hctz [hydrochlorothiazide], Nsaids, Gabapentin, and Hydroxyzine  Review of Systems   Review of Systems  Constitutional: Negative for chills and fever.  Respiratory: Negative for shortness of breath.   Cardiovascular: Negative for chest pain.  Gastrointestinal: Positive for abdominal pain. Negative for blood in stool, diarrhea, nausea and vomiting.  Genitourinary: Negative for dysuria.  All other systems reviewed and are negative.   Physical Exam Updated Vital Signs BP (!) 146/103 (BP Location: Right Arm)   Pulse 84   Temp 98.3 F (36.8 C) (Oral)   Resp 18   SpO2 100%   Physical Exam Vitals and nursing note reviewed.  Constitutional:      General: He is not in acute distress.    Appearance: Normal appearance. He is well-developed. He is not ill-appearing.  HENT:     Head: Normocephalic and atraumatic.  Eyes:     General: No scleral icterus.       Right eye: No discharge.        Left eye: No discharge.     Conjunctiva/sclera: Conjunctivae normal.     Pupils: Pupils are equal, round, and reactive to light.  Cardiovascular:     Rate and Rhythm: Normal rate and regular  rhythm.  Pulmonary:     Effort: Pulmonary effort is normal. No respiratory distress.     Breath sounds: Normal breath sounds.  Abdominal:     General: There is no distension.     Palpations: Abdomen is soft.     Tenderness: There is abdominal tenderness (Mild epigastric tenderness).  Musculoskeletal:     Cervical back: Normal range of motion.  Skin:    General: Skin is warm and dry.  Neurological:     Mental Status: He is alert and oriented to person, place, and time.     Comments: Mildly tremulous  Psychiatric:        Mood and Affect: Mood is anxious.        Behavior: Behavior normal.     ED Results / Procedures / Treatments   Labs (all labs ordered are listed, but only abnormal results are displayed) Labs Reviewed  COMPREHENSIVE METABOLIC PANEL - Abnormal; Notable for the following components:      Result Value   Chloride 97 (*)    BUN 7 (*)    Total Protein 8.5 (*)    All other components within normal limits  CBC  LIPASE, BLOOD  TYPE AND SCREEN  ABO/RH    EKG None  Radiology No results found.  Procedures Procedures (including critical care time)  Medications Ordered in ED Medications  sodium chloride 0.9 % bolus 1,000 mL (0 mLs Intravenous Stopped 11/21/19 1701)  ondansetron (ZOFRAN) injection 4 mg (4 mg Intravenous Given 11/21/19 1408)  LORazepam (ATIVAN) injection 1 mg (1 mg Intravenous Given 11/21/19 1409)  famotidine (PEPCID) IVPB 20 mg premix (0 mg Intravenous Stopped 11/21/19 1701)  alum & mag hydroxide-simeth (MAALOX/MYLANTA) 200-200-20 MG/5ML suspension 30 mL (30 mLs Oral Given 11/21/19 1408)  sucralfate (CARAFATE) tablet 1 g (1 g Oral Given 11/21/19 1702)  LORazepam (ATIVAN) injection 1 mg (1 mg Intravenous Given 11/21/19 1702)    ED Course  I have reviewed the triage vital signs and the nursing notes.  Pertinent labs & imaging results that were available during my  care of the patient were reviewed by me and considered in my medical decision making  (see chart for details).  64 year old male with anxiety, hx of ETOH abuse, and chronic abdominal pain presents with worsening epigastric abdominal pain after running out of carafate. His vitals are reassuring. Heart is regular rate and rhythm. Lungs are CTA. Abdomen is soft and non-tender. He is mildly tremulous. Will give fluids, anti-emetic, ativan, pepcid, GI cocktail.  3:53 PM Reassess pt. He states he is still nauseous and having pain although he is requesting something to eat. Will give another dose of ativan and carafate and PO challenge. Labs are reassuring however lipase was not added in triage. Will add this on.  Lipase is normal. Pt has numerous excuses as to why he can't get to his mailbox to pick up Carafate. Will d/c.   MDM Rules/Calculators/A&P                           Final Clinical Impression(s) / ED Diagnoses Final diagnoses:  Epigastric pain  Anxiety    Rx / DC Orders ED Discharge Orders    None       Recardo Evangelist, PA-C 11/22/19 [place], MD 11/25/19 [phone]

## 2019-11-21 NOTE — ED Triage Notes (Signed)
BIB EMS with abd pain r/t gastric ulcers x 5 days. 152/74-100-96% CBG 252

## 2019-11-21 NOTE — Discharge Instructions (Signed)
Please continue Protonix and restart your Carafate Avoid any spicy, fatty, acidic foods. Avoid any alcohol and NSAIDs (Ibuprofen, Aleve, etc) Please follow up with your doctor

## 2019-12-18 ENCOUNTER — Encounter (HOSPITAL_COMMUNITY): Payer: Self-pay | Admitting: Emergency Medicine

## 2019-12-18 ENCOUNTER — Emergency Department (HOSPITAL_COMMUNITY)
Admission: EM | Admit: 2019-12-18 | Discharge: 2019-12-18 | Disposition: A | Discharge status: Home / Self Care | Payer: Medicaid Other | Attending: Emergency Medicine | Admitting: Emergency Medicine

## 2019-12-18 DIAGNOSIS — R519 Headache, unspecified: Secondary | ICD-10-CM | POA: Diagnosis not present

## 2019-12-18 DIAGNOSIS — Z5321 Procedure and treatment not carried out due to patient leaving prior to being seen by health care provider: Secondary | ICD-10-CM | POA: Insufficient documentation

## 2019-12-18 NOTE — ED Triage Notes (Signed)
Per EMS-states he is complaining of a headache and is dehydrated-states he has not eaten or drank anything in a couple of days

## 2020-01-05 ENCOUNTER — Emergency Department (HOSPITAL_COMMUNITY)
Admission: EM | Admit: 2020-01-05 | Discharge: 2020-01-05 | Disposition: A | Discharge status: Home / Self Care | Payer: Medicaid Other | Attending: Emergency Medicine | Admitting: Emergency Medicine

## 2020-01-05 ENCOUNTER — Other Ambulatory Visit: Payer: Self-pay

## 2020-01-05 ENCOUNTER — Encounter (HOSPITAL_COMMUNITY): Payer: Self-pay

## 2020-01-05 DIAGNOSIS — Z5321 Procedure and treatment not carried out due to patient leaving prior to being seen by health care provider: Secondary | ICD-10-CM | POA: Diagnosis not present

## 2020-01-05 DIAGNOSIS — R519 Headache, unspecified: Secondary | ICD-10-CM | POA: Insufficient documentation

## 2020-01-05 DIAGNOSIS — R630 Anorexia: Secondary | ICD-10-CM | POA: Insufficient documentation

## 2020-01-05 NOTE — ED Notes (Signed)
At 1000 pt gave labels to screener and stated he was leaving.

## 2020-01-05 NOTE — ED Provider Notes (Signed)
This patient left before evaluation, prior to being placed in an examination room.   Daleen Bo, MD 01/05/20 1025

## 2020-01-05 NOTE — ED Triage Notes (Signed)
Patient brought from home by [facility] and complaining of headache and a lack of appetite for 4 days.

## 2020-01-19 ENCOUNTER — Emergency Department (HOSPITAL_COMMUNITY)
Admission: EM | Admit: 2020-01-19 | Discharge: 2020-01-19 | Disposition: A | Discharge status: Home / Self Care | Payer: Medicaid Other | Attending: Emergency Medicine | Admitting: Emergency Medicine

## 2020-01-19 ENCOUNTER — Other Ambulatory Visit: Payer: Self-pay

## 2020-01-19 ENCOUNTER — Encounter (HOSPITAL_COMMUNITY): Payer: Self-pay | Admitting: *Deleted

## 2020-01-19 DIAGNOSIS — R5383 Other fatigue: Secondary | ICD-10-CM | POA: Insufficient documentation

## 2020-01-19 DIAGNOSIS — Z5321 Procedure and treatment not carried out due to patient leaving prior to being seen by health care provider: Secondary | ICD-10-CM | POA: Diagnosis not present

## 2020-01-19 DIAGNOSIS — R197 Diarrhea, unspecified: Secondary | ICD-10-CM | POA: Insufficient documentation

## 2020-01-19 NOTE — ED Triage Notes (Signed)
BIB by EMS after 3 days to 2 weeks of fatigue.Diarrhea x 3 days.  Seem by PCP last week and started on Klonopin. 132/68-94-99% CBG 170

## 2020-01-19 NOTE — ED Notes (Signed)
Pt gave labels to screener stating he is not going to wait, left facility

## 2020-05-02 ENCOUNTER — Other Ambulatory Visit: Payer: Self-pay

## 2020-05-02 ENCOUNTER — Emergency Department (HOSPITAL_COMMUNITY): Payer: Medicaid Other

## 2020-05-02 ENCOUNTER — Emergency Department (HOSPITAL_COMMUNITY)
Admission: EM | Admit: 2020-05-02 | Discharge: 2020-05-02 | Disposition: A | Discharge status: Home / Self Care | Payer: Medicaid Other | Attending: Emergency Medicine | Admitting: Emergency Medicine

## 2020-05-02 DIAGNOSIS — Z87891 Personal history of nicotine dependence: Secondary | ICD-10-CM | POA: Insufficient documentation

## 2020-05-02 DIAGNOSIS — I10 Essential (primary) hypertension: Secondary | ICD-10-CM | POA: Insufficient documentation

## 2020-05-02 DIAGNOSIS — Z79899 Other long term (current) drug therapy: Secondary | ICD-10-CM | POA: Insufficient documentation

## 2020-05-02 DIAGNOSIS — R197 Diarrhea, unspecified: Secondary | ICD-10-CM | POA: Insufficient documentation

## 2020-05-02 DIAGNOSIS — R5383 Other fatigue: Secondary | ICD-10-CM | POA: Insufficient documentation

## 2020-05-02 DIAGNOSIS — R519 Headache, unspecified: Secondary | ICD-10-CM | POA: Diagnosis not present

## 2020-05-02 DIAGNOSIS — M542 Cervicalgia: Secondary | ICD-10-CM | POA: Insufficient documentation

## 2020-05-02 DIAGNOSIS — Z20822 Contact with and (suspected) exposure to covid-19: Secondary | ICD-10-CM | POA: Insufficient documentation

## 2020-05-02 LAB — CBC WITH DIFFERENTIAL/PLATELET
Abs Immature Granulocytes: 0.02 10*3/uL (ref 0.00–0.07)
Basophils Absolute: 0.1 10*3/uL (ref 0.0–0.1)
Basophils Relative: 1 %
Eosinophils Absolute: 0 10*3/uL (ref 0.0–0.5)
Eosinophils Relative: 0 %
HCT: 40.9 % (ref 39.0–52.0)
Hemoglobin: 14.1 g/dL (ref 13.0–17.0)
Immature Granulocytes: 0 %
Lymphocytes Relative: 17 %
Lymphs Abs: 1.4 10*3/uL (ref 0.7–4.0)
MCH: 31.9 pg (ref 26.0–34.0)
MCHC: 34.5 g/dL (ref 30.0–36.0)
MCV: 92.5 fL (ref 80.0–100.0)
Monocytes Absolute: 0.6 10*3/uL (ref 0.1–1.0)
Monocytes Relative: 8 %
Neutro Abs: 5.9 10*3/uL (ref 1.7–7.7)
Neutrophils Relative %: 74 %
Platelets: 309 10*3/uL (ref 150–400)
RBC: 4.42 MIL/uL (ref 4.22–5.81)
RDW: 12.2 % (ref 11.5–15.5)
WBC: 8 10*3/uL (ref 4.0–10.5)
nRBC: 0 % (ref 0.0–0.2)

## 2020-05-02 LAB — URINALYSIS, ROUTINE W REFLEX MICROSCOPIC
Bilirubin Urine: NEGATIVE
Glucose, UA: NEGATIVE mg/dL
Hgb urine dipstick: NEGATIVE
Ketones, ur: NEGATIVE mg/dL
Leukocytes,Ua: NEGATIVE
Nitrite: NEGATIVE
Protein, ur: NEGATIVE mg/dL
Specific Gravity, Urine: 1.005 (ref 1.005–1.030)
pH: 8 (ref 5.0–8.0)

## 2020-05-02 LAB — I-STAT CHEM 8, ED
BUN: 5 mg/dL — ABNORMAL LOW (ref 8–23)
Calcium, Ion: 1.08 mmol/L — ABNORMAL LOW (ref 1.15–1.40)
Chloride: 96 mmol/L — ABNORMAL LOW (ref 98–111)
Creatinine, Ser: 0.8 mg/dL (ref 0.61–1.24)
Glucose, Bld: 92 mg/dL (ref 70–99)
HCT: 42 % (ref 39.0–52.0)
Hemoglobin: 14.3 g/dL (ref 13.0–17.0)
Potassium: 4.2 mmol/L (ref 3.5–5.1)
Sodium: 133 mmol/L — ABNORMAL LOW (ref 135–145)
TCO2: 29 mmol/L (ref 22–32)

## 2020-05-02 LAB — COMPREHENSIVE METABOLIC PANEL
ALT: 12 U/L (ref 0–44)
AST: 32 U/L (ref 15–41)
Albumin: 4.5 g/dL (ref 3.5–5.0)
Alkaline Phosphatase: 72 U/L (ref 38–126)
Anion gap: 11 (ref 5–15)
BUN: 5 mg/dL — ABNORMAL LOW (ref 8–23)
CO2: 24 mmol/L (ref 22–32)
Calcium: 9.2 mg/dL (ref 8.9–10.3)
Chloride: 97 mmol/L — ABNORMAL LOW (ref 98–111)
Creatinine, Ser: 0.84 mg/dL (ref 0.61–1.24)
GFR, Estimated: >60 mL/min (ref >60)
Glucose, Bld: 89 mg/dL (ref 70–99)
Potassium: 5.2 mmol/L — ABNORMAL HIGH (ref 3.5–5.1)
Sodium: 132 mmol/L — ABNORMAL LOW (ref 135–145)
Total Bilirubin: 1.3 mg/dL — ABNORMAL HIGH (ref 0.3–1.2)
Total Protein: 7.8 g/dL (ref 6.5–8.1)

## 2020-05-02 LAB — RESP PANEL BY RT-PCR (FLU A&B, COVID) ARPGX2
Influenza A by PCR: NEGATIVE
Influenza B by PCR: NEGATIVE
SARS Coronavirus 2 by RT PCR: NEGATIVE

## 2020-05-02 LAB — OSMOLALITY, URINE: Osmolality, Ur: 251 mOsm/kg — ABNORMAL LOW (ref 300–900)

## 2020-05-02 LAB — SODIUM, URINE, RANDOM: Sodium, Ur: 68 mmol/L

## 2020-05-02 LAB — OSMOLALITY: Osmolality: 277 mOsm/kg (ref 275–295)

## 2020-05-02 MED ORDER — FENTANYL CITRATE (PF) 100 MCG/2ML IJ SOLN
50.0000 ug | Freq: Once | INTRAMUSCULAR | Status: AC
  Administered 2020-05-02: 50 ug via INTRAVENOUS
  Filled 2020-05-02: qty 2

## 2020-05-02 MED ORDER — LORAZEPAM 2 MG/ML IJ SOLN
1.0000 mg | Freq: Once | INTRAMUSCULAR | Status: AC
  Administered 2020-05-02: 1 mg via INTRAVENOUS
  Filled 2020-05-02: qty 1

## 2020-05-02 MED ORDER — SODIUM CHLORIDE 0.9 % IV BOLUS
500.0000 mL | Freq: Once | INTRAVENOUS | Status: AC
  Administered 2020-05-02: 500 mL via INTRAVENOUS

## 2020-05-02 NOTE — ED Notes (Signed)
Pt states he is dehydrated and needs IV fluids, complains of HA for past few days and not able to eat due to nausea. Respirations even and unlabored. Will continue to monitor.

## 2020-05-02 NOTE — Discharge Instructions (Signed)
Your work-up today did not show evidence of acute hyponatremia and the CT head did not show any cerebral edema or other acute intracranial abnormalities.  I suspect your headaches are more chronic in nature and after the medications her headache began to improve.  Please rest and stay hydrated.  We will give you some fluids today but please use oral rehydration.  If any symptoms change or worsen, return to the nearest emergency department.  Please follow-up with your primary doctor.

## 2020-05-02 NOTE — ED Triage Notes (Signed)
PT arrived via [facility], Pt is A&O X 4, complains of HA for a couple days. States he needs IV fluids. Vitals 169/90 87  16  98  Hx hypertension

## 2020-05-02 NOTE — ED Provider Notes (Signed)
[facility] DEPT Provider Note   CSN: [id number] Arrival date & time: 05/02/20  1052     History No chief complaint on file.   RENE SIZELOVE is a 64 y.o. male.  The history is provided by the patient and medical records. No language interpreter was used.  Headache Pain location:  L parietal Quality:  Dull Radiates to:  Does not radiate Severity currently:  10/10 Severity at highest:  10/10 Onset quality:  Gradual Duration:  4 days Timing:  Constant Progression:  Worsening Chronicity:  Recurrent Similar to prior headaches: yes   Context: not exposure to bright light   Relieved by:  Nothing Worsened by:  Nothing Ineffective treatments:  None tried Associated symptoms: diarrhea, fatigue and neck pain (chronic)   Associated symptoms: no abdominal pain, no back pain, no blurred vision, no congestion, no cough, no dizziness, no facial pain, no fever, no focal weakness, no loss of balance, no nausea, no near-syncope, no neck stiffness, no numbness, no paresthesias, no photophobia, no sore throat, no tingling, no URI, no visual change, no vomiting and no weakness        Past Medical History:  Diagnosis Date  . Alcohol abuse, in remission   . Anal fissure   . Anemia   . Ankylosing spondylitis ([place])   . Anxiety   . Arthritis   . Chronic diarrhea   . Chronic headaches   . Chronic pain   . Colitis   . Colon polyps   . Depression   . Esophagitis   . Gastric polyps    hyperplastic and fundic gland  . Gastritis   . GERD (gastroesophageal reflux disease)   . Hypertension   . IBS (irritable bowel syndrome)   . Poor dentition   . SIADH (syndrome of inappropriate ADH production) [facility])     Patient Active Problem List   Diagnosis Date Noted  . Family history of colon cancer 12/02/2018  . Gastric polyps   . Schatzki's ring   . Left sided ulcerative colitis without complication ([place])   . Odynophagia 11/25/2018  . Dysphagia 11/25/2018    . Major depressive disorder, recurrent severe without psychotic features ([place]) 06/24/2018  . IBS (irritable bowel syndrome)   . Hypertension   . Colitis   . Adjustment disorder with mixed anxiety and depressed mood 05/11/2017  . Alcohol abuse   . Anxiety   . Gastroesophageal reflux disease   . Osteopenia determined by x-ray 08/06/2014  . Stress fracture of calcaneus 08/06/2014  . Vitamin D deficiency 12/18/2013  . Dementia ([place]) 12/18/2013  . Benign microscopic hematuria 07/06/2013  . SIADH (syndrome of inappropriate ADH production) ([place]) 06/22/2013  . Ankylosing spondylitis ([place]) 06/20/2013  . Malnutrition of moderate degree ([place]) 06/20/2013  . BPH (benign prostatic hyperplasia) 08/22/2010  . History of colonic polyps 08/13/2008  . Essential hypertension 07/03/2007  . PUD (peptic ulcer disease) 07/03/2007    Past Surgical History:  Procedure Laterality Date  . BIOPSY  11/26/2018   Procedure: BIOPSY;  Surgeon: Gatha Mayer, MD;  Location: [facility];  Service: Endoscopy;;  . COLONOSCOPY W/ BIOPSIES    . COLONOSCOPY WITH PROPOFOL N/A 11/26/2018   Procedure: COLONOSCOPY WITH PROPOFOL;  Surgeon: Gatha Mayer, MD;  Location: [facility];  Service: Endoscopy;  Laterality: N/A;  . ESOPHAGOGASTRODUODENOSCOPY    . ESOPHAGOGASTRODUODENOSCOPY (EGD) WITH PROPOFOL N/A 11/26/2018   Procedure: ESOPHAGOGASTRODUODENOSCOPY (EGD) WITH PROPOFOL;  Surgeon: Gatha Mayer, MD;  Location: [facility];  Service: Endoscopy;  Laterality: N/A;  . HEMOSTASIS CLIP PLACEMENT  11/26/2018   Procedure: HEMOSTASIS CLIP PLACEMENT;  Surgeon: Gatha Mayer, MD;  Location: [facility];  Service: Endoscopy;;  . HERNIA REPAIR Bilateral   . HOT HEMOSTASIS N/A 11/26/2018   Procedure: HOT HEMOSTASIS (ARGON PLASMA COAGULATION/BICAP);  Surgeon: Gatha Mayer, MD;  Location: [facility];  Service: Endoscopy;  Laterality: N/A;  . OPEN REDUCTION INTERNAL FIXATION (ORIF) DISTAL RADIAL FRACTURE Right 02/11/2018    Procedure: OPEN REDUCTION INTERNAL FIXATION (ORIF) DISTAL RADIAL FRACTURE;  Surgeon: Milly Jakob, MD;  Location: [facility];  Service: Orthopedics;  Laterality: Right;  . POLYPECTOMY  11/26/2018   Procedure: POLYPECTOMY;  Surgeon: Gatha Mayer, MD;  Location: [facility];  Service: Endoscopy;;  . TONSILLECTOMY         Family History  Problem Relation Age of Onset  . Anxiety disorder Mother   . Congestive Heart Failure Mother   . Crohn's disease Mother   . Colon cancer Mother 35  . Arthritis Father   . High blood pressure Father   . Crohn's disease Father     Social History   Tobacco Use  . Smoking status: Former Smoker    Types: Cigarettes    Quit date: 2014    Years since quitting: 7.9  . Smokeless tobacco: Never Used  Vaping Use  . Vaping Use: Never used  Substance Use Topics  . Alcohol use: Yes    Comment: 1 25 oz of beer a night  . Drug use: No    Home Medications Prior to Admission medications   Medication Sig Start Date End Date Taking? Authorizing Provider  acetaminophen (TYLENOL) 325 MG tablet Take 2 tablets (650 mg total) by mouth every 6 (six) hours as needed for mild pain. (May buy over the counter) 06/27/18   Connye Burkitt, NP  Brexpiprazole (REXULTI) 2 MG TABS Take 2 mg by mouth daily.    [provider]  clonazePAM (KLONOPIN) 1 MG tablet Take 1 mg by mouth 3 (three) times daily.    [provider]  DULoxetine (CYMBALTA) 30 MG capsule Take 60 mg by mouth daily.     [provider]  lisinopril (PRINIVIL,ZESTRIL) 10 MG tablet Take 1 tablet (10 mg total) by mouth daily. Patient taking differently: Take 10 mg by mouth at bedtime.  08/21/17 11/21/19  Melanee Spry, MD  Multiple Vitamin (MULTIVITAMIN WITH MINERALS) TABS tablet Take 1 tablet by mouth daily. (May buy over the counter) 06/28/18   Connye Burkitt, NP  pantoprazole (PROTONIX) 40 MG tablet Take 1 tablet (40 mg total) by mouth daily. 04/24/14   Janith Lima, MD    QUEtiapine (SEROQUEL) 300 MG tablet Take 300 mg by mouth at bedtime.     [provider]  sodium chloride (OCEAN) 0.65 % SOLN nasal spray Place 2 sprays into both nostrils 4 (four) times daily as needed for congestion.     [provider]  sucralfate (CARAFATE) 1 G tablet Take 1 tablet (1 g total) by mouth 4 (four) times daily -  with meals and at bedtime. Chew before swallowing Patient taking differently: Take 1 g by mouth 4 (four) times daily. Chew before swallowing 12/17/13   Janith Lima, MD  tiZANidine (ZANAFLEX) 4 MG tablet Take 2 mg by mouth 3 (three) times daily.    [provider]  verapamil (CALAN-SR) 240 MG CR tablet Take 240 mg by mouth at bedtime. 02/09/17   [provider]  Allergies    Diphenhydramine hcl, Flexeril [cyclobenzaprine], Hctz [hydrochlorothiazide], Nsaids, Gabapentin, and Hydroxyzine  Review of Systems   Review of Systems  Constitutional: Positive for fatigue. Negative for chills, diaphoresis and fever.  HENT: Negative for congestion and sore throat.   Eyes: Negative for blurred vision, photophobia and visual disturbance.  Respiratory: Negative for cough, chest tightness, shortness of breath and wheezing.   Cardiovascular: Negative for chest pain, palpitations, leg swelling and near-syncope.  Gastrointestinal: Positive for diarrhea. Negative for abdominal pain, constipation, nausea and vomiting.  Genitourinary: Negative for dysuria, flank pain and frequency.  Musculoskeletal: Positive for neck pain (chronic). Negative for back pain and neck stiffness.  Neurological: Positive for light-headedness and headaches. Negative for dizziness, focal weakness, speech difficulty, weakness, numbness, paresthesias and loss of balance.  Psychiatric/Behavioral: Negative for agitation and confusion.  All other systems reviewed and are negative.   Physical Exam Updated Vital Signs BP (!) 149/67 (BP Location: Right Arm)   Pulse 78   Temp  98.2 F (36.8 C) (Oral)   Resp 16   Ht 5' 10"  (1.778 m)   Wt 77.1 kg   SpO2 99%   BMI 24.39 kg/m   Physical Exam Vitals and nursing note reviewed.  Constitutional:      General: He is not in acute distress.    Appearance: He is well-developed. He is not ill-appearing, toxic-appearing or diaphoretic.  HENT:     Head: Normocephalic and atraumatic.     Mouth/Throat:     Mouth: Mucous membranes are moist.     Pharynx: No oropharyngeal exudate or posterior oropharyngeal erythema.  Eyes:     Conjunctiva/sclera: Conjunctivae normal.     Pupils: Pupils are equal, round, and reactive to light.  Cardiovascular:     Rate and Rhythm: Normal rate and regular rhythm.     Pulses: Normal pulses.     Heart sounds: No murmur heard.   Pulmonary:     Effort: Pulmonary effort is normal. No respiratory distress.     Breath sounds: Normal breath sounds. No wheezing, rhonchi or rales.  Chest:     Chest wall: No tenderness.  Abdominal:     General: Abdomen is flat.     Palpations: Abdomen is soft.     Tenderness: There is no abdominal tenderness. There is no right CVA tenderness, left CVA tenderness, guarding or rebound.  Musculoskeletal:        General: No tenderness.     Cervical back: Neck supple. No tenderness.     Right lower leg: No edema.     Left lower leg: No edema.  Skin:    General: Skin is warm and dry.     Capillary Refill: Capillary refill takes less than 2 seconds.     Findings: No erythema or rash.  Neurological:     General: No focal deficit present.     Mental Status: He is alert and oriented to person, place, and time.     Sensory: No sensory deficit.     Motor: No weakness.  Psychiatric:        Mood and Affect: Mood normal.     ED Results / Procedures / Treatments   Labs (all labs ordered are listed, but only abnormal results are displayed) Labs Reviewed  OSMOLALITY, URINE - Abnormal; Notable for the following components:      Result Value   Osmolality, Ur 251  (*)    All other components within normal limits  COMPREHENSIVE METABOLIC PANEL - Abnormal; Notable  for the following components:   Sodium 132 (*)    Potassium 5.2 (*)    Chloride 97 (*)    BUN 5 (*)    Total Bilirubin 1.3 (*)    All other components within normal limits  URINALYSIS, ROUTINE W REFLEX MICROSCOPIC - Abnormal; Notable for the following components:   Color, Urine STRAW (*)    All other components within normal limits  I-STAT CHEM 8, ED - Abnormal; Notable for the following components:   Sodium 133 (*)    Chloride 96 (*)    BUN 5 (*)    Calcium, Ion 1.08 (*)    All other components within normal limits  RESP PANEL BY RT-PCR (FLU A&B, COVID) ARPGX2  URINE CULTURE  OSMOLALITY  CBC WITH DIFFERENTIAL/PLATELET  SODIUM, URINE, RANDOM    EKG None  Radiology CT Head Wo Contrast  Result Date: 05/02/2020 CLINICAL DATA:  Headache EXAM: CT HEAD WITHOUT CONTRAST TECHNIQUE: Contiguous axial images were obtained from the base of the skull through the vertex without intravenous contrast. COMPARISON:  06/21/2018 FINDINGS: Brain: No evidence of acute infarction, hemorrhage, hydrocephalus, extra-axial collection or mass lesion/mass effect. Vascular: No hyperdense vessel or unexpected calcification. Skull: Normal. Negative for fracture or focal lesion. Sinuses/Orbits: Small amount of debris within the left sphenoid sinus. The remaining visualized paranasal sinuses and mastoid air cells are otherwise clear. Orbital structures within normal limits. Other: None. IMPRESSION: 1. No acute intracranial findings. 2. Left sphenoid sinus disease. Electronically Signed   By: Davina Poke D.O.   On: 05/02/2020 12:23    Procedures Procedures (including critical care time)  Medications Ordered in ED Medications  LORazepam (ATIVAN) injection 1 mg (1 mg Intravenous Given 05/02/20 1323)  fentaNYL (SUBLIMAZE) injection 50 mcg (50 mcg Intravenous Given 05/02/20 1324)  sodium chloride 0.9 % bolus  500 mL (0 mLs Intravenous Stopped 05/02/20 1550)  fentaNYL (SUBLIMAZE) injection 50 mcg (50 mcg Intravenous Given 05/02/20 1556)    ED Course  I have reviewed the triage vital signs and the nursing notes.  Pertinent labs & imaging results that were available during my care of the patient were reviewed by me and considered in my medical decision making (see chart for details).    MDM Rules/Calculators/A&P                          GURKIRAT BASHER is a 64 y.o. male with a past medical history significant for hypertension, ankylosing spondylitis with chronic neck and back pain, SIADH, dementia, GERD, IBS, anxiety, and depression who presents with worsening headache, fatigue, lightheadedness, darkened urine, and diarrhea.  Patient reports that for the last few days he has been having headaches which feel like when he has had SIADH and "brain swelling" in the past.  He reports no neurologic deficits but does report feeling fatigued and tired.  He reports his urine has been darker and has had diarrhea recently.  He says that he eats fluids and despite drinking as much as he can at home he still feels dehydrated.  He reports his mouth feels dry.  He denies any fevers, chills, chest pain, shortness of breath.  He reports he always has some chronic neck pain due to the ankylosing spondylitis but his headache is worsened acutely in the last 24 hours and is now 10 out of 10 in his left posterior head.  This is where he normally hurts with it.  He reports no diplopia or vision changes.  Denies any speech difficulties.  Denies any numbness, tingling, or weakness in his extremities.  Denies any falls or trauma.  Denies any other complaints.  On exam, lungs are clear and chest is nontender abdomen is nontender.  Normal sensation and strength in extremities.  Normal finger-nose-finger testing bilaterally in the upper extremities.  Clear speech.  Symmetric smile.  Normal extraocular movements and pupils are  symmetric.  No nuchal rigidity and no carotid bruit appreciated.  Neck is nontender.  Given patient's reported history of SIADH with cerebral edema and this headache feeling similar, we will get a CT head as well as screening labs.  We will hold on initial fluids until we see what his sodium is.  We will also get urine sodium and check osmolality.  Due to the possible admission need, will get a Covid swab.    We discussed headache management and he says that he cannot tolerate Benadryl and he does not want to take Compazine.  We will give him some fentanyl and some Ativan to help with his anxiety and wait on the labs to return for rehydration.  Anticipate reassessment after work-up.  Patient's work-up returned showing minimal hyponatremia with a sodium of 133.  CT head reassuring.  Covid and flu test negative.  CBC shows no significant abnormalities.  Urinalysis not show infection.  Urine osmolality is the best is been 3 years.  Other work-up reassuring.  Unclear to of the patient's headache however he has chronic headaches and I suspect dehydration prompted this episode.  Patient was feeling better after medications.  He was given some fluids as he is not in acute SIADH.  He is feeling better.  He will follow-up with his PCP and understands return precautions.  He had no questions or concerns and was discharged in good condition.   Final Clinical Impression(s) / ED Diagnoses Final diagnoses:  Acute nonintractable headache, unspecified headache type    Rx / DC Orders ED Discharge Orders    None     Clinical Impression: 1. Acute nonintractable headache, unspecified headache type     Disposition: Discharge  Condition: Good  I have discussed the results, Dx and Tx plan with the pt(& family if present). He/she/they expressed understanding and agree(s) with the plan. Discharge instructions discussed at great length. Strict return precautions discussed and pt &/or family have verbalized  understanding of the instructions. No further questions at time of discharge.    New Prescriptions   No medications on file    Follow Up: Sandi Mariscal, MD [facility] [id number] [facility] [address] [id number] [place]       Zoeie Ritter, Gwenyth Allegra, MD 05/02/20 [phone]

## 2020-05-03 LAB — URINE CULTURE: Culture: NO GROWTH

## 2020-07-02 IMAGING — CT CT ABD-PELV W/ CM
2 of 5 series · 16 of 46 positions shown, 18 images · IV contrast (ISOVUE)
Comparison: CT abdomen and pelvis 04/17/2018 and 06/11/2017.

CLINICAL DATA: Abdominal pain, nausea, vomiting and diarrhea for 1
week.

EXAM:
CT ABDOMEN AND PELVIS WITH CONTRAST
TECHNIQUE: Multidetector CT imaging of the abdomen and pelvis was performed
using the standard protocol following bolus administration of
intravenous contrast.
CONTRAST:  100 mL YG7X4V-M00 IOPAMIDOL (YG7X4V-M00) INJECTION 61%

[Series 2: axial st · axial · 0.85mm/px · z∈[-875,-440]mm · 13 of 103 slices shown, 15 images]
[im 8/103  soft-tissue]
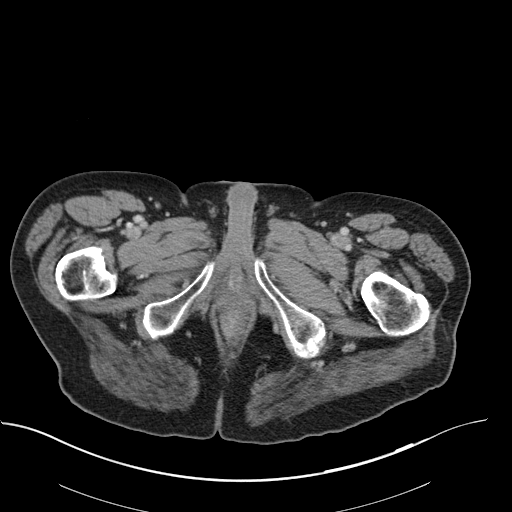
[im 8/103  bone]
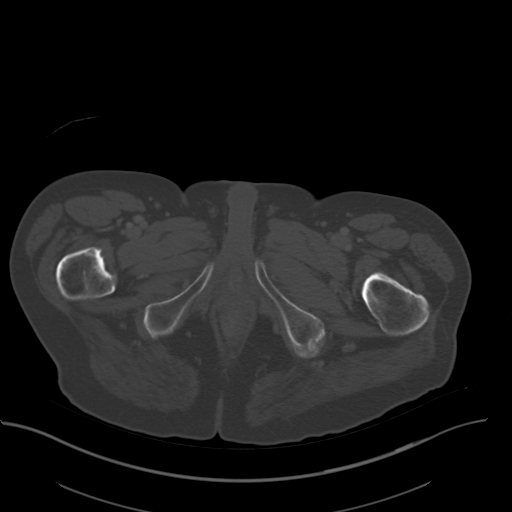
[im 15/103  soft-tissue]
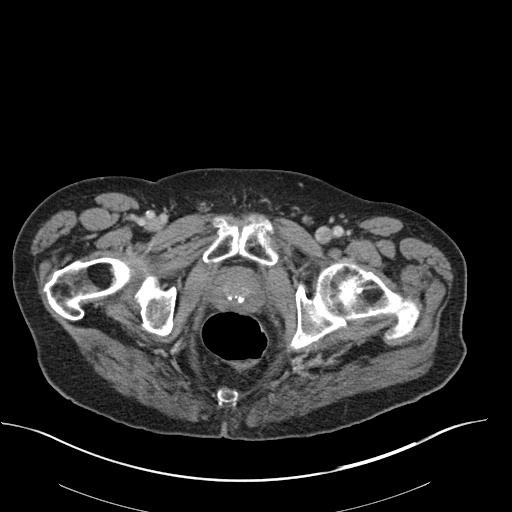
[im 22/103  soft-tissue]
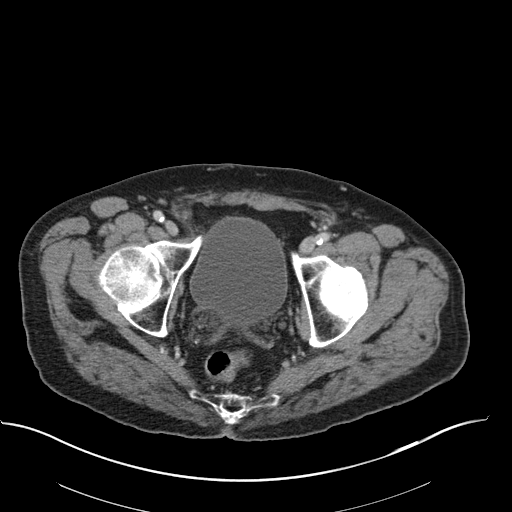
[im 30/103  soft-tissue]
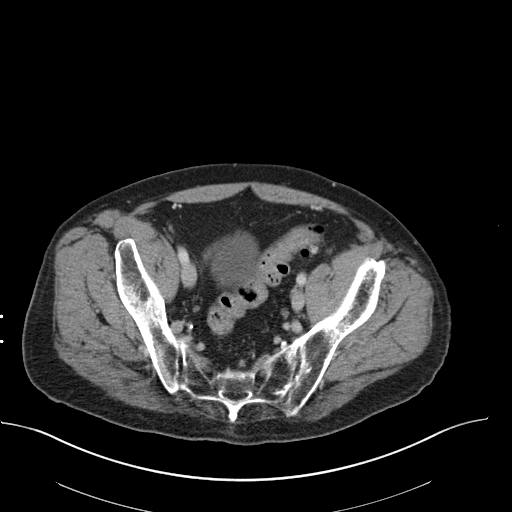
[im 37/103  soft-tissue]
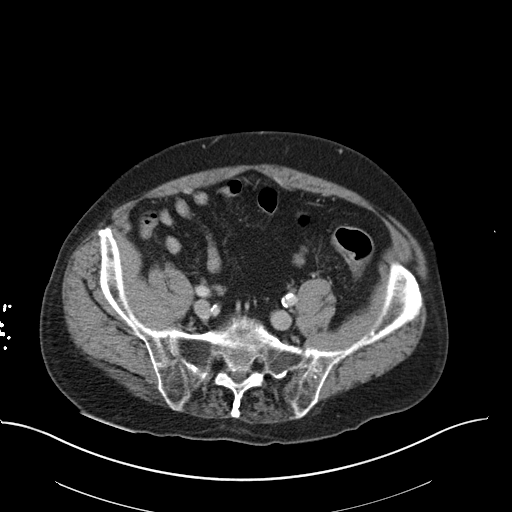
[im 44/103  soft-tissue]
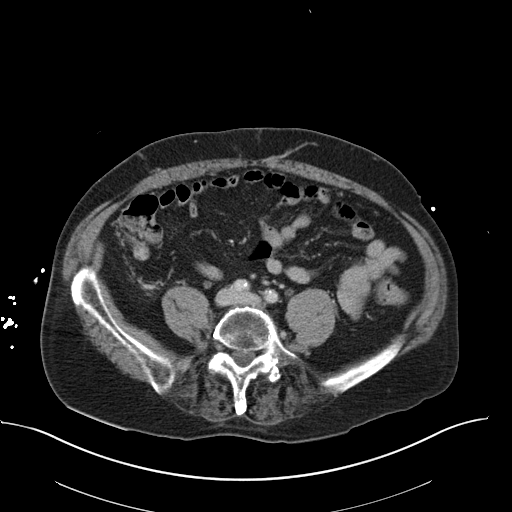
[im 52/103  soft-tissue]
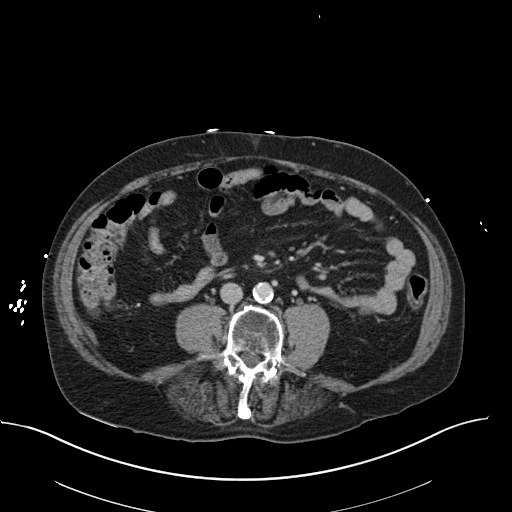
[im 59/103  soft-tissue]
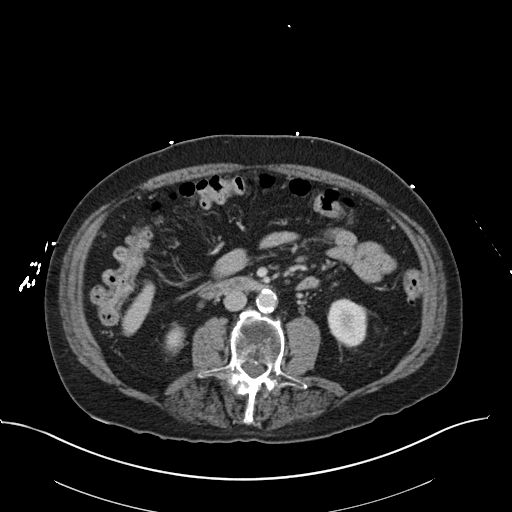
[im 66/103  soft-tissue]
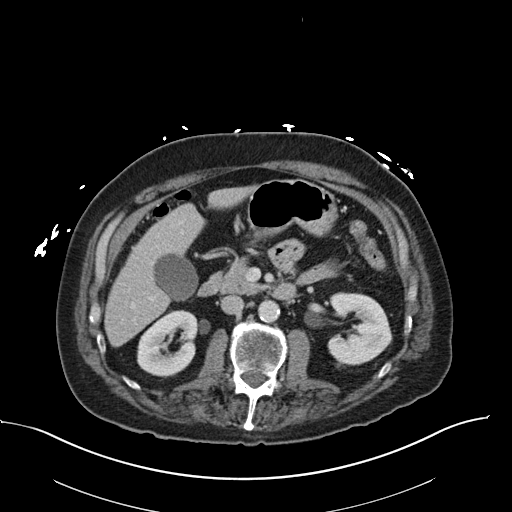
[im 66/103  bone]
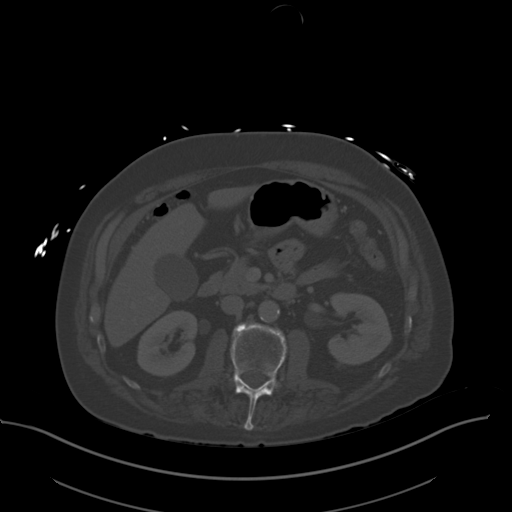
[im 73/103  soft-tissue]
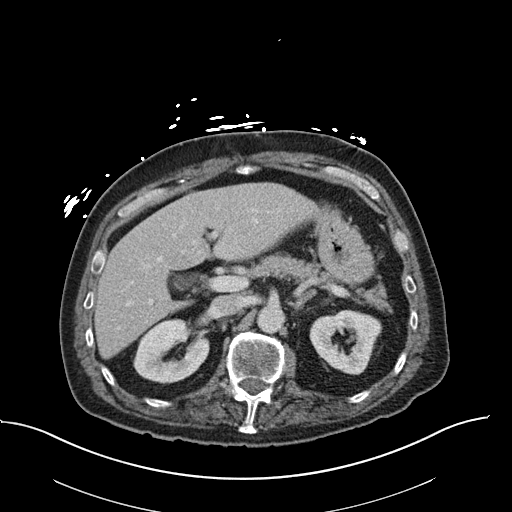
[im 81/103  soft-tissue]
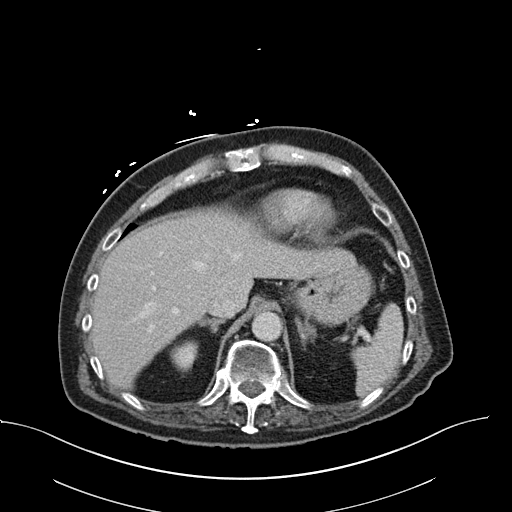
[im 88/103  soft-tissue]
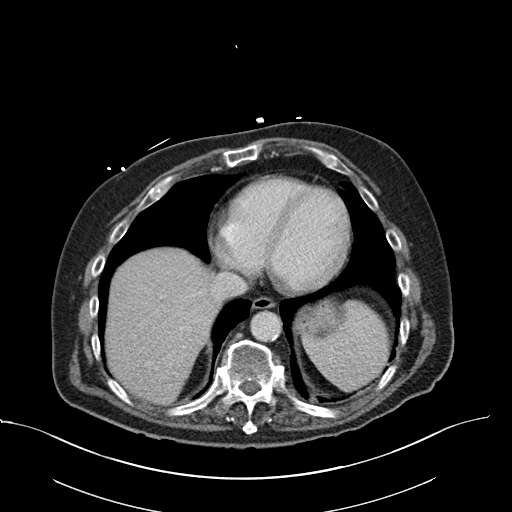
[im 95/103  soft-tissue]
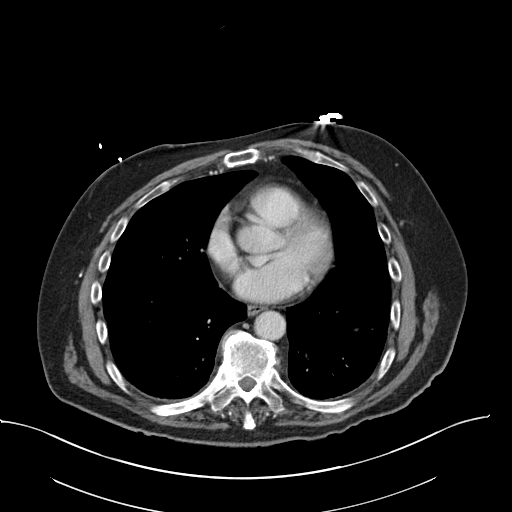

[Series 5: coronal st · coronal · 0.83mm/px · 3 of 100 slices shown]
[im 34/100  soft-tissue]
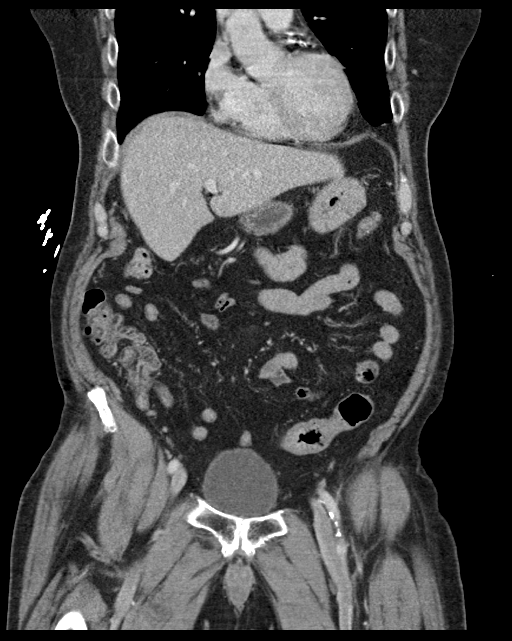
[im 45/100  soft-tissue]
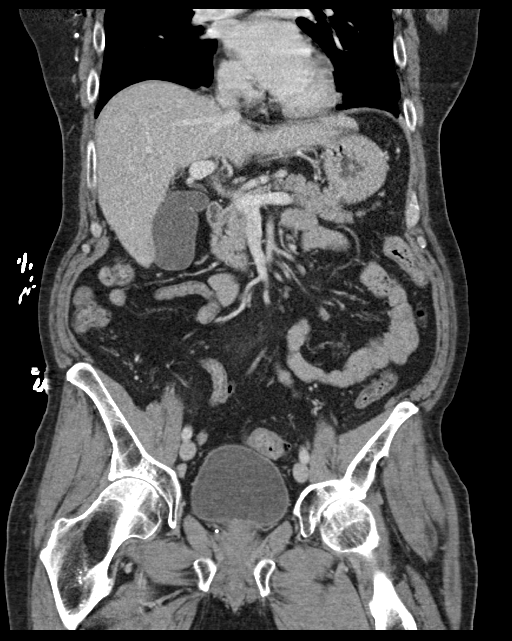
[im 56/100  soft-tissue]
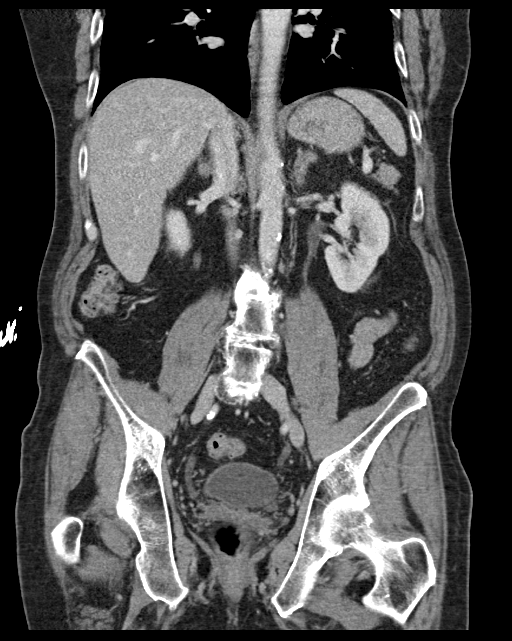

[16 of 46 positions shown; findings below may reference images not displayed]

FINDINGS: Lower chest: Calcific coronary artery disease is seen. Heart size is
normal. No pleural or pericardial effusion. Minimal dependent
atelectasis noted.

Hepatobiliary: No focal liver abnormality is seen. No gallstones,
gallbladder wall thickening, or biliary dilatation.

Pancreas: Unremarkable. No pancreatic ductal dilatation or
surrounding inflammatory changes.

Spleen: Normal in size without focal abnormality.

Adrenals/Urinary Tract: Mild thickening of the left adrenal gland
compatible with hyperplasia is stable in appearance. The right
adrenal gland appears normal.

Stomach/Bowel: Mild sigmoid diverticulosis without diverticulitis is
identified. The colon is otherwise unremarkable. The stomach, small
bowel and appendix appear normal.

Vascular/Lymphatic: Aortic atherosclerosis. No enlarged abdominal or
pelvic lymph nodes.

Reproductive: Prostate is unremarkable.

Other: Tiny fat containing umbilical hernia noted.

Musculoskeletal: No acute bony abnormality is identified. Flowing
syndesmophyte formation across all imaged vertebral bodies,
ankylosis of the facets and ankylosis of the SI joints are
consistent with ankylosing spondylitis.
IMPRESSION: No acute abnormality abdomen or pelvis.

Calcific aortic and coronary atherosclerosis.

Mild sigmoid diverticulosis without diverticulitis.

Ankylosing spondylitis.

## 2020-08-19 ENCOUNTER — Encounter (HOSPITAL_COMMUNITY): Payer: Self-pay | Admitting: Emergency Medicine

## 2020-08-19 ENCOUNTER — Emergency Department (HOSPITAL_COMMUNITY)
Admission: EM | Admit: 2020-08-19 | Discharge: 2020-08-19 | Disposition: A | Discharge status: Home / Self Care | Payer: Medicaid Other | Attending: Emergency Medicine | Admitting: Emergency Medicine

## 2020-08-19 DIAGNOSIS — E860 Dehydration: Secondary | ICD-10-CM

## 2020-08-19 DIAGNOSIS — Z79899 Other long term (current) drug therapy: Secondary | ICD-10-CM | POA: Insufficient documentation

## 2020-08-19 DIAGNOSIS — F0390 Unspecified dementia without behavioral disturbance: Secondary | ICD-10-CM | POA: Diagnosis not present

## 2020-08-19 DIAGNOSIS — Z87891 Personal history of nicotine dependence: Secondary | ICD-10-CM | POA: Insufficient documentation

## 2020-08-19 DIAGNOSIS — I10 Essential (primary) hypertension: Secondary | ICD-10-CM | POA: Diagnosis not present

## 2020-08-19 DIAGNOSIS — R519 Headache, unspecified: Secondary | ICD-10-CM | POA: Diagnosis present

## 2020-08-19 LAB — COMPREHENSIVE METABOLIC PANEL
ALT: 15 U/L (ref 0–44)
AST: 22 U/L (ref 15–41)
Albumin: 4.5 g/dL (ref 3.5–5.0)
Alkaline Phosphatase: 61 U/L (ref 38–126)
Anion gap: 10 (ref 5–15)
BUN: 9 mg/dL (ref 8–23)
CO2: 25 mmol/L (ref 22–32)
Calcium: 9.5 mg/dL (ref 8.9–10.3)
Chloride: 98 mmol/L (ref 98–111)
Creatinine, Ser: 0.66 mg/dL (ref 0.61–1.24)
GFR, Estimated: >60 mL/min (ref >60)
Glucose, Bld: 94 mg/dL (ref 70–99)
Potassium: 4.3 mmol/L (ref 3.5–5.1)
Sodium: 133 mmol/L — ABNORMAL LOW (ref 135–145)
Total Bilirubin: 0.6 mg/dL (ref 0.3–1.2)
Total Protein: 7.8 g/dL (ref 6.5–8.1)

## 2020-08-19 LAB — CBC WITH DIFFERENTIAL/PLATELET
Abs Immature Granulocytes: 0.03 10*3/uL (ref 0.00–0.07)
Basophils Absolute: 0.1 10*3/uL (ref 0.0–0.1)
Basophils Relative: 1 %
Eosinophils Absolute: 0 10*3/uL (ref 0.0–0.5)
Eosinophils Relative: 0 %
HCT: 42.3 % (ref 39.0–52.0)
Hemoglobin: 14.3 g/dL (ref 13.0–17.0)
Immature Granulocytes: 0 %
Lymphocytes Relative: 15 %
Lymphs Abs: 1.2 10*3/uL (ref 0.7–4.0)
MCH: 31.3 pg (ref 26.0–34.0)
MCHC: 33.8 g/dL (ref 30.0–36.0)
MCV: 92.6 fL (ref 80.0–100.0)
Monocytes Absolute: 0.7 10*3/uL (ref 0.1–1.0)
Monocytes Relative: 8 %
Neutro Abs: 6.1 10*3/uL (ref 1.7–7.7)
Neutrophils Relative %: 76 %
Platelets: 306 10*3/uL (ref 150–400)
RBC: 4.57 MIL/uL (ref 4.22–5.81)
RDW: 12.7 % (ref 11.5–15.5)
WBC: 8.1 10*3/uL (ref 4.0–10.5)
nRBC: 0 % (ref 0.0–0.2)

## 2020-08-19 MED ORDER — LORAZEPAM 2 MG/ML IJ SOLN
0.5000 mg | Freq: Once | INTRAMUSCULAR | Status: AC
  Administered 2020-08-19: 0.5 mg via INTRAVENOUS
  Filled 2020-08-19: qty 1

## 2020-08-19 MED ORDER — KETOROLAC TROMETHAMINE 15 MG/ML IJ SOLN
15.0000 mg | Freq: Once | INTRAMUSCULAR | Status: AC
  Administered 2020-08-19: 15 mg via INTRAVENOUS
  Filled 2020-08-19: qty 1

## 2020-08-19 MED ORDER — SODIUM CHLORIDE 0.9 % IV BOLUS
500.0000 mL | Freq: Once | INTRAVENOUS | Status: AC
  Administered 2020-08-19: 500 mL via INTRAVENOUS

## 2020-08-19 NOTE — ED Triage Notes (Signed)
Per EMS-states he is dehydrated because he has a headache-complaining of neck pain which is chronic

## 2020-08-19 NOTE — ED Provider Notes (Signed)
[facility] DEPT Provider Note   CSN: [id number] Arrival date & time: 08/19/20  0915     History Chief Complaint  Patient presents with  . Headache    Anthony Lamb is a 65 y.o. male.  65 year old male with prior medical history detailed below presents for evaluation.  Patient is concerned that he might be dehydrated.  Patient reports to me that he does not have teeth, therefore he does not eat well.  He cannot eat "meat or vegetables."   He denies associated symptoms such as fever, nausea, vomiting, chest pain, abdominal pain, other acute complaint.  The history is provided by the patient and medical records.  Illness Location:  Possible dehydration Severity:  Mild Onset quality:  Unable to specify Timing:  Unable to specify Progression:  Unable to specify      Past Medical History:  Diagnosis Date  . Alcohol abuse, in remission   . Anal fissure   . Anemia   . Ankylosing spondylitis ([place])   . Anxiety   . Arthritis   . Chronic diarrhea   . Chronic headaches   . Chronic pain   . Colitis   . Colon polyps   . Depression   . Esophagitis   . Gastric polyps    hyperplastic and fundic gland  . Gastritis   . GERD (gastroesophageal reflux disease)   . Hypertension   . IBS (irritable bowel syndrome)   . Poor dentition   . SIADH (syndrome of inappropriate ADH production) [facility])     Patient Active Problem List   Diagnosis Date Noted  . Family history of colon cancer 12/02/2018  . Gastric polyps   . Schatzki's ring   . Left sided ulcerative colitis without complication ([place])   . Odynophagia 11/25/2018  . Dysphagia 11/25/2018  . Major depressive disorder, recurrent severe without psychotic features ([place]) 06/24/2018  . IBS (irritable bowel syndrome)   . Hypertension   . Colitis   . Adjustment disorder with mixed anxiety and depressed mood 05/11/2017  . Alcohol abuse   . Anxiety   . Gastroesophageal reflux disease   . Osteopenia  determined by x-ray 08/06/2014  . Stress fracture of calcaneus 08/06/2014  . Vitamin D deficiency 12/18/2013  . Dementia ([place]) 12/18/2013  . Benign microscopic hematuria 07/06/2013  . SIADH (syndrome of inappropriate ADH production) ([place]) 06/22/2013  . Ankylosing spondylitis ([place]) 06/20/2013  . Malnutrition of moderate degree ([place]) 06/20/2013  . BPH (benign prostatic hyperplasia) 08/22/2010  . History of colonic polyps 08/13/2008  . Essential hypertension 07/03/2007  . PUD (peptic ulcer disease) 07/03/2007    Past Surgical History:  Procedure Laterality Date  . BIOPSY  11/26/2018   Procedure: BIOPSY;  Surgeon: Gatha Mayer, MD;  [facility];  Service: Endoscopy;;  . COLONOSCOPY W/ BIOPSIES    . COLONOSCOPY WITH PROPOFOL N/A 11/26/2018   Procedure: COLONOSCOPY WITH PROPOFOL;  Surgeon: Gatha Mayer, MD;  [facility];  Service: Endoscopy;  Laterality: N/A;  . ESOPHAGOGASTRODUODENOSCOPY    . ESOPHAGOGASTRODUODENOSCOPY (EGD) WITH PROPOFOL N/A 11/26/2018   Procedure: ESOPHAGOGASTRODUODENOSCOPY (EGD) WITH PROPOFOL;  Surgeon: Gatha Mayer, MD;  [facility];  Service: Endoscopy;  Laterality: N/A;  . HEMOSTASIS CLIP PLACEMENT  11/26/2018   Procedure: HEMOSTASIS CLIP PLACEMENT;  Surgeon: Gatha Mayer, MD;  [facility];  Service: Endoscopy;;  . HERNIA REPAIR Bilateral   . HOT HEMOSTASIS N/A 11/26/2018   Procedure: HOT HEMOSTASIS (ARGON PLASMA COAGULATION/BICAP);  Surgeon: Silvano Rusk  E, MD;  [facility];  Service: Endoscopy;  Laterality: N/A;  . OPEN REDUCTION INTERNAL FIXATION (ORIF) DISTAL RADIAL FRACTURE Right 02/11/2018   Procedure: OPEN REDUCTION INTERNAL FIXATION (ORIF) DISTAL RADIAL FRACTURE;  Surgeon: Milly Jakob, MD;  Location: [facility];  Service: Orthopedics;  Laterality: Right;  . POLYPECTOMY  11/26/2018   Procedure: POLYPECTOMY;  Surgeon: Gatha Mayer, MD;  [facility];  Service: Endoscopy;;  . TONSILLECTOMY          Family History  Problem Relation Age of Onset  . Anxiety disorder Mother   . Congestive Heart Failure Mother   . Crohn's disease Mother   . Colon cancer Mother 58  . Arthritis Father   . High blood pressure Father   . Crohn's disease Father     Social History   Tobacco Use  . Smoking status: Former Smoker    Types: Cigarettes    Quit date: 2014    Years since quitting: 8.2  . Smokeless tobacco: Never Used  Vaping Use  . Vaping Use: Never used  Substance Use Topics  . Alcohol use: Yes    Comment: 1 25 oz of beer a night  . Drug use: No    Home Medications Prior to Admission medications   Medication Sig Start Date End Date Taking? Authorizing Provider  acetaminophen (TYLENOL) 325 MG tablet Take 2 tablets (650 mg total) by mouth every 6 (six) hours as needed for mild pain. (May buy over the counter) 06/27/18   Connye Burkitt, NP  brexpiprazole (REXULTI) 2 MG TABS tablet Take 2 mg by mouth daily.     [provider]  clonazePAM (KLONOPIN) 1 MG tablet Take 1 mg by mouth 3 (three) times daily.    [provider]  DULoxetine (CYMBALTA) 30 MG capsule Take 60 mg by mouth daily.     [provider]  lisinopril (PRINIVIL,ZESTRIL) 10 MG tablet Take 1 tablet (10 mg total) by mouth daily. Patient taking differently: Take 10 mg by mouth at bedtime.  08/21/17 05/02/20  Melanee Spry, MD  Multiple Vitamin (MULTIVITAMIN WITH MINERALS) TABS tablet Take 1 tablet by mouth daily. (May buy over the counter) 06/28/18   Connye Burkitt, NP  pantoprazole (PROTONIX) 40 MG tablet Take 1 tablet (40 mg total) by mouth daily. 04/24/14   Janith Lima, MD  QUEtiapine (SEROQUEL) 300 MG tablet Take 300 mg by mouth at bedtime.     [provider]  sodium chloride (OCEAN) 0.65 % SOLN nasal spray Place 2 sprays into both nostrils 4 (four) times daily as needed for congestion.     [provider]  sucralfate (CARAFATE) 1 G tablet Take 1 tablet (1 g  total) by mouth 4 (four) times daily -  with meals and at bedtime. Chew before swallowing Patient taking differently: Take 1 g by mouth 4 (four) times daily. Chew before swallowing 12/17/13   Janith Lima, MD  tiZANidine (ZANAFLEX) 4 MG tablet Take 2 mg by mouth 3 (three) times daily.    [provider]  verapamil (CALAN-SR) 240 MG CR tablet Take 240 mg by mouth at bedtime. 02/09/17   [provider]    Allergies    Diphenhydramine hcl, Flexeril [cyclobenzaprine], Hctz [hydrochlorothiazide], Nsaids, Gabapentin, and Hydroxyzine  Review of Systems   Review of Systems  All other systems reviewed and are negative.   Physical Exam Updated Vital Signs BP (!) 150/83   Pulse 77   Temp 98.3 F (36.8  C) (Oral)   Resp 19   SpO2 100%   Physical Exam Vitals and nursing note reviewed.  Constitutional:      General: He is not in acute distress.    Appearance: He is well-developed.  HENT:     Head: Normocephalic and atraumatic.  Eyes:     Conjunctiva/sclera: Conjunctivae normal.     Pupils: Pupils are equal, round, and reactive to light.  Cardiovascular:     Rate and Rhythm: Normal rate and regular rhythm.     Heart sounds: Normal heart sounds.  Pulmonary:     Effort: Pulmonary effort is normal. No respiratory distress.     Breath sounds: Normal breath sounds.  Abdominal:     General: There is no distension.     Palpations: Abdomen is soft.     Tenderness: There is no abdominal tenderness.  Musculoskeletal:        General: No deformity. Normal range of motion.     Cervical back: Normal range of motion and neck supple.  Skin:    General: Skin is warm and dry.  Neurological:     Mental Status: He is alert and oriented to person, place, and time.     ED Results / Procedures / Treatments   Labs (all labs ordered are listed, but only abnormal results are displayed) Labs Reviewed  COMPREHENSIVE METABOLIC PANEL - Abnormal; Notable for the following components:       Result Value   Sodium 133 (*)    All other components within normal limits  CBC WITH DIFFERENTIAL/PLATELET    EKG None  Radiology No results found.  Procedures Procedures   Medications Ordered in ED Medications  ketorolac (TORADOL) 15 MG/ML injection 15 mg (has no administration in time range)  sodium chloride 0.9 % bolus 500 mL (500 mLs Intravenous New Bag/Given 08/19/20 1132)  LORazepam (ATIVAN) injection 0.5 mg (0.5 mg Intravenous Given 08/19/20 1130)    ED Course  I have reviewed the triage vital signs and the nursing notes.  Pertinent labs & imaging results that were available during my care of the patient were reviewed by me and considered in my medical decision making (see chart for details).    MDM Rules/Calculators/A&P                          MDM  Screen complete  Anthony Lamb was evaluated in Emergency Department on 08/19/2020 for the symptoms described in the history of present illness. He was evaluated in the context of the global COVID-19 pandemic, which necessitated consideration that the patient might be at risk for infection with the SARS-CoV-2 virus that causes COVID-19. Institutional protocols and algorithms that pertain to the evaluation of patients at risk for COVID-19 are in a state of rapid change based on information released by regulatory bodies including the CDC and federal and state organizations. These policies and algorithms were followed during the patient's care in the ED.    Patient presenting over concern for possible dehydration.  Patient is clinically without signs of significant dehydration.  Screening labs obtained are without significant abnormality.  Patient does feel improved after IV fluids.  Patient understands need for close follow-up.  Patient is advised to eat a better diet.   Final Clinical Impression(s) / ED Diagnoses Final diagnoses:  Dehydration    Rx / DC Orders ED Discharge Orders    None       Valarie Merino, MD 08/19/20  1234  

## 2020-08-19 NOTE — Discharge Instructions (Addendum)
Please return for any problem.  °

## 2020-08-19 NOTE — ED Notes (Signed)
[employer] called for patient and on the way.

## 2020-09-14 ENCOUNTER — Encounter (HOSPITAL_COMMUNITY): Payer: Self-pay

## 2020-09-14 ENCOUNTER — Emergency Department (HOSPITAL_COMMUNITY)
Admission: EM | Admit: 2020-09-14 | Discharge: 2020-09-14 | Disposition: A | Discharge status: Home / Self Care | Payer: Medicaid Other | Attending: Emergency Medicine | Admitting: Emergency Medicine

## 2020-09-14 ENCOUNTER — Other Ambulatory Visit: Payer: Self-pay

## 2020-09-14 DIAGNOSIS — Z20822 Contact with and (suspected) exposure to covid-19: Secondary | ICD-10-CM | POA: Insufficient documentation

## 2020-09-14 DIAGNOSIS — M542 Cervicalgia: Secondary | ICD-10-CM | POA: Insufficient documentation

## 2020-09-14 DIAGNOSIS — R112 Nausea with vomiting, unspecified: Secondary | ICD-10-CM

## 2020-09-14 DIAGNOSIS — Z79899 Other long term (current) drug therapy: Secondary | ICD-10-CM | POA: Insufficient documentation

## 2020-09-14 DIAGNOSIS — E871 Hypo-osmolality and hyponatremia: Secondary | ICD-10-CM | POA: Diagnosis not present

## 2020-09-14 DIAGNOSIS — G8929 Other chronic pain: Secondary | ICD-10-CM | POA: Diagnosis not present

## 2020-09-14 DIAGNOSIS — Z87891 Personal history of nicotine dependence: Secondary | ICD-10-CM | POA: Diagnosis not present

## 2020-09-14 DIAGNOSIS — I10 Essential (primary) hypertension: Secondary | ICD-10-CM | POA: Insufficient documentation

## 2020-09-14 DIAGNOSIS — J329 Chronic sinusitis, unspecified: Secondary | ICD-10-CM

## 2020-09-14 LAB — CBC WITH DIFFERENTIAL/PLATELET
Abs Immature Granulocytes: 0.06 10*3/uL (ref 0.00–0.07)
Basophils Absolute: 0 10*3/uL (ref 0.0–0.1)
Basophils Relative: 0 %
Eosinophils Absolute: 0 10*3/uL (ref 0.0–0.5)
Eosinophils Relative: 0 %
HCT: 39.6 % (ref 39.0–52.0)
Hemoglobin: 13.6 g/dL (ref 13.0–17.0)
Immature Granulocytes: 1 %
Lymphocytes Relative: 9 %
Lymphs Abs: 1.1 10*3/uL (ref 0.7–4.0)
MCH: 31.3 pg (ref 26.0–34.0)
MCHC: 34.3 g/dL (ref 30.0–36.0)
MCV: 91.2 fL (ref 80.0–100.0)
Monocytes Absolute: 1.1 10*3/uL — ABNORMAL HIGH (ref 0.1–1.0)
Monocytes Relative: 9 %
Neutro Abs: 10.8 10*3/uL — ABNORMAL HIGH (ref 1.7–7.7)
Neutrophils Relative %: 81 %
Platelets: 277 10*3/uL (ref 150–400)
RBC: 4.34 MIL/uL (ref 4.22–5.81)
RDW: 12.3 % (ref 11.5–15.5)
WBC: 13.1 10*3/uL — ABNORMAL HIGH (ref 4.0–10.5)
nRBC: 0 % (ref 0.0–0.2)

## 2020-09-14 LAB — COMPREHENSIVE METABOLIC PANEL
ALT: 13 U/L (ref 0–44)
AST: 24 U/L (ref 15–41)
Albumin: 4.3 g/dL (ref 3.5–5.0)
Alkaline Phosphatase: 68 U/L (ref 38–126)
Anion gap: 10 (ref 5–15)
BUN: 6 mg/dL — ABNORMAL LOW (ref 8–23)
CO2: 25 mmol/L (ref 22–32)
Calcium: 9.3 mg/dL (ref 8.9–10.3)
Chloride: 90 mmol/L — ABNORMAL LOW (ref 98–111)
Creatinine, Ser: 0.72 mg/dL (ref 0.61–1.24)
GFR, Estimated: >60 mL/min (ref >60)
Glucose, Bld: 139 mg/dL — ABNORMAL HIGH (ref 70–99)
Potassium: 3.9 mmol/L (ref 3.5–5.1)
Sodium: 125 mmol/L — ABNORMAL LOW (ref 135–145)
Total Bilirubin: 0.7 mg/dL (ref 0.3–1.2)
Total Protein: 7.7 g/dL (ref 6.5–8.1)

## 2020-09-14 LAB — RESP PANEL BY RT-PCR (FLU A&B, COVID) ARPGX2
Influenza A by PCR: NEGATIVE
Influenza B by PCR: NEGATIVE
SARS Coronavirus 2 by RT PCR: NEGATIVE

## 2020-09-14 LAB — URINALYSIS, ROUTINE W REFLEX MICROSCOPIC
Bacteria, UA: NONE SEEN
Bilirubin Urine: NEGATIVE
Glucose, UA: NEGATIVE mg/dL
Ketones, ur: NEGATIVE mg/dL
Leukocytes,Ua: NEGATIVE
Nitrite: NEGATIVE
Protein, ur: NEGATIVE mg/dL
Specific Gravity, Urine: 1.002 — ABNORMAL LOW (ref 1.005–1.030)
pH: 7 (ref 5.0–8.0)

## 2020-09-14 LAB — LIPASE, BLOOD: Lipase: 25 U/L (ref 11–51)

## 2020-09-14 MED ORDER — SODIUM CHLORIDE 0.9 % IV BOLUS (SEPSIS)
1000.0000 mL | Freq: Once | INTRAVENOUS | Status: AC
  Administered 2020-09-14: 1000 mL via INTRAVENOUS

## 2020-09-14 MED ORDER — FENTANYL CITRATE (PF) 100 MCG/2ML IJ SOLN
50.0000 ug | Freq: Once | INTRAMUSCULAR | Status: AC
  Administered 2020-09-14: 50 ug via INTRAVENOUS
  Filled 2020-09-14: qty 2

## 2020-09-14 MED ORDER — SODIUM CHLORIDE 0.9 % IV SOLN
1000.0000 mL | INTRAVENOUS | Status: DC
  Administered 2020-09-14: 1000 mL via INTRAVENOUS

## 2020-09-14 MED ORDER — LORAZEPAM 2 MG/ML IJ SOLN
1.0000 mg | Freq: Once | INTRAMUSCULAR | Status: AC
  Administered 2020-09-14: 1 mg via INTRAVENOUS
  Filled 2020-09-14: qty 1

## 2020-09-14 MED ORDER — ACETAMINOPHEN 325 MG PO TABS
650.0000 mg | ORAL_TABLET | Freq: Once | ORAL | Status: AC
  Administered 2020-09-14: 650 mg via ORAL
  Filled 2020-09-14: qty 2

## 2020-09-14 MED ORDER — AMOXICILLIN-POT CLAVULANATE 875-125 MG PO TABS: 1.0000 | ORAL_TABLET | Freq: Two times a day (BID) | ORAL | 0 refills | Status: DC

## 2020-09-14 MED ORDER — ONDANSETRON HCL 4 MG/2ML IJ SOLN
4.0000 mg | Freq: Once | INTRAMUSCULAR | Status: AC
  Administered 2020-09-14: 4 mg via INTRAVENOUS
  Filled 2020-09-14: qty 2

## 2020-09-14 MED ORDER — AMOXICILLIN-POT CLAVULANATE 875-125 MG PO TABS: 1.0000 | ORAL_TABLET | Freq: Two times a day (BID) | ORAL | 0 refills | Status: AC

## 2020-09-14 NOTE — ED Provider Notes (Addendum)
[facility] DEPT Provider Note   CSN: [id number] Arrival date & time: 09/14/20  1884     History Chief Complaint  Patient presents with  . Nausea  . Emesis    Anthony Lamb is a 65 y.o. male.  The history is provided by the patient. No language interpreter was used.  Emesis Severity:  Moderate Timing:  Constant Quality:  Unable to specify Progression:  Worsening Chronicity:  Recurrent Recent urination:  Normal Relieved by:  Nothing Ineffective treatments:  None tried Pt complains of nausea and vomiting.  Pt reports he has chronic pain in his neck and frequent headaches.  Pt reports not eating or drinking due to nausea and vomiting.  Pt has had multiple visit for vomiting      Past Medical History:  Diagnosis Date  . Alcohol abuse, in remission   . Anal fissure   . Anemia   . Ankylosing spondylitis ([place])   . Anxiety   . Arthritis   . Chronic diarrhea   . Chronic headaches   . Chronic pain   . Colitis   . Colon polyps   . Depression   . Esophagitis   . Gastric polyps    hyperplastic and fundic gland  . Gastritis   . GERD (gastroesophageal reflux disease)   . Hypertension   . IBS (irritable bowel syndrome)   . Poor dentition   . SIADH (syndrome of inappropriate ADH production) [facility])     Patient Active Problem List   Diagnosis Date Noted  . Family history of colon cancer 12/02/2018  . Gastric polyps   . Schatzki's ring   . Left sided ulcerative colitis without complication ([place])   . Odynophagia 11/25/2018  . Dysphagia 11/25/2018  . Major depressive disorder, recurrent severe without psychotic features ([place]) 06/24/2018  . IBS (irritable bowel syndrome)   . Hypertension   . Colitis   . Adjustment disorder with mixed anxiety and depressed mood 05/11/2017  . Alcohol abuse   . Anxiety   . Gastroesophageal reflux disease   . Osteopenia determined by x-ray 08/06/2014  . Stress fracture of calcaneus 08/06/2014  . Vitamin  D deficiency 12/18/2013  . Dementia ([place]) 12/18/2013  . Benign microscopic hematuria 07/06/2013  . SIADH (syndrome of inappropriate ADH production) ([place]) 06/22/2013  . Ankylosing spondylitis ([place]) 06/20/2013  . Malnutrition of moderate degree ([place]) 06/20/2013  . BPH (benign prostatic hyperplasia) 08/22/2010  . History of colonic polyps 08/13/2008  . Essential hypertension 07/03/2007  . PUD (peptic ulcer disease) 07/03/2007    Past Surgical History:  Procedure Laterality Date  . BIOPSY  11/26/2018   Procedure: BIOPSY;  Surgeon: Gatha Mayer, MD;  [facility];  Service: Endoscopy;;  . COLONOSCOPY W/ BIOPSIES    . COLONOSCOPY WITH PROPOFOL N/A 11/26/2018   Procedure: COLONOSCOPY WITH PROPOFOL;  Surgeon: Gatha Mayer, MD;  [facility];  Service: Endoscopy;  Laterality: N/A;  . ESOPHAGOGASTRODUODENOSCOPY    . ESOPHAGOGASTRODUODENOSCOPY (EGD) WITH PROPOFOL N/A 11/26/2018   Procedure: ESOPHAGOGASTRODUODENOSCOPY (EGD) WITH PROPOFOL;  Surgeon: Gatha Mayer, MD;  [facility];  Service: Endoscopy;  Laterality: N/A;  . HEMOSTASIS CLIP PLACEMENT  11/26/2018   Procedure: HEMOSTASIS CLIP PLACEMENT;  Surgeon: Gatha Mayer, MD;  [facility];  Service: Endoscopy;;  . HERNIA REPAIR Bilateral   . HOT HEMOSTASIS N/A 11/26/2018   Procedure: HOT HEMOSTASIS (ARGON PLASMA COAGULATION/BICAP);  Surgeon: Gatha Mayer, MD;  [facility];  Service: Endoscopy;  Laterality: N/A;  .  OPEN REDUCTION INTERNAL FIXATION (ORIF) DISTAL RADIAL FRACTURE Right 02/11/2018   Procedure: OPEN REDUCTION INTERNAL FIXATION (ORIF) DISTAL RADIAL FRACTURE;  Surgeon: Milly Jakob, MD;  Location: [facility];  Service: Orthopedics;  Laterality: Right;  . POLYPECTOMY  11/26/2018   Procedure: POLYPECTOMY;  Surgeon: Gatha Mayer, MD;  [facility];  Service: Endoscopy;;  . TONSILLECTOMY         Family History  Problem Relation Age of Onset  . Anxiety disorder Mother    . Congestive Heart Failure Mother   . Crohn's disease Mother   . Colon cancer Mother 46  . Arthritis Father   . High blood pressure Father   . Crohn's disease Father     Social History   Tobacco Use  . Smoking status: Former Smoker    Types: Cigarettes    Quit date: 2014    Years since quitting: 8.2  . Smokeless tobacco: Never Used  Vaping Use  . Vaping Use: Never used  Substance Use Topics  . Alcohol use: Yes    Comment: 1 25 oz of beer a night  . Drug use: No    Home Medications Prior to Admission medications   Medication Sig Start Date End Date Taking? Authorizing Provider  acetaminophen (TYLENOL) 325 MG tablet Take 2 tablets (650 mg total) by mouth every 6 (six) hours as needed for mild pain. (May buy over the counter) 06/27/18   Connye Burkitt, NP  brexpiprazole (REXULTI) 2 MG TABS tablet Take 2 mg by mouth daily.     [provider]  clonazePAM (KLONOPIN) 1 MG tablet Take 1 mg by mouth 3 (three) times daily.    [provider]  DULoxetine (CYMBALTA) 30 MG capsule Take 60 mg by mouth daily.     [provider]  lisinopril (PRINIVIL,ZESTRIL) 10 MG tablet Take 1 tablet (10 mg total) by mouth daily. Patient taking differently: Take 10 mg by mouth at bedtime.  08/21/17 05/02/20  Melanee Spry, MD  Multiple Vitamin (MULTIVITAMIN WITH MINERALS) TABS tablet Take 1 tablet by mouth daily. (May buy over the counter) 06/28/18   Connye Burkitt, NP  pantoprazole (PROTONIX) 40 MG tablet Take 1 tablet (40 mg total) by mouth daily. 04/24/14   Janith Lima, MD  QUEtiapine (SEROQUEL) 300 MG tablet Take 300 mg by mouth at bedtime.     [provider]  sodium chloride (OCEAN) 0.65 % SOLN nasal spray Place 2 sprays into both nostrils 4 (four) times daily as needed for congestion.     [provider]  sucralfate (CARAFATE) 1 G tablet Take 1 tablet (1 g total) by mouth 4 (four) times daily -  with meals and at bedtime. Chew before  swallowing Patient taking differently: Take 1 g by mouth 4 (four) times daily. Chew before swallowing 12/17/13   Janith Lima, MD  tiZANidine (ZANAFLEX) 4 MG tablet Take 2 mg by mouth 3 (three) times daily.    [provider]  verapamil (CALAN-SR) 240 MG CR tablet Take 240 mg by mouth at bedtime. 02/09/17   [provider]    Allergies    Diphenhydramine hcl, Flexeril [cyclobenzaprine], Hctz [hydrochlorothiazide], Nsaids, Gabapentin, and Hydroxyzine  Review of Systems   Review of Systems  Gastrointestinal: Positive for vomiting.  All other systems reviewed and are negative.   Physical Exam Updated Vital Signs BP 136/71   Pulse 73   Temp 97.6 F (36.4 C) (Oral)   Resp 18   Ht 5'  10" (1.778 m)   Wt 77.1 kg   SpO2 97%   BMI 24.39 kg/m   Physical Exam Vitals and nursing note reviewed.  Constitutional:      Appearance: He is well-developed.  HENT:     Head: Normocephalic.     Right Ear: Tympanic membrane normal.     Left Ear: Tympanic membrane normal.     Mouth/Throat:     Mouth: Mucous membranes are moist.  Eyes:     Pupils: Pupils are equal, round, and reactive to light.  Cardiovascular:     Rate and Rhythm: Normal rate.  Pulmonary:     Effort: Pulmonary effort is normal.  Abdominal:     General: There is no distension.  Musculoskeletal:        General: Normal range of motion.     Cervical back: Normal range of motion.  Skin:    General: Skin is warm.  Neurological:     General: No focal deficit present.     Mental Status: He is alert and oriented to person, place, and time.  Psychiatric:        Mood and Affect: Mood normal.     ED Results / Procedures / Treatments   Labs (all labs ordered are listed, but only abnormal results are displayed) Labs Reviewed  CBC WITH DIFFERENTIAL/PLATELET - Abnormal; Notable for the following components:      Result Value   WBC 13.1 (*)    Neutro Abs 10.8 (*)    Monocytes Absolute 1.1 (*)    All other  components within normal limits  COMPREHENSIVE METABOLIC PANEL - Abnormal; Notable for the following components:   Sodium 125 (*)    Chloride 90 (*)    Glucose, Bld 139 (*)    BUN 6 (*)    All other components within normal limits  URINALYSIS, ROUTINE W REFLEX MICROSCOPIC - Abnormal; Notable for the following components:   Color, Urine STRAW (*)    Specific Gravity, Urine 1.002 (*)    Hgb urine dipstick SMALL (*)    All other components within normal limits  LIPASE, BLOOD    EKG None  Radiology No results found.  Procedures Procedures   Medications Ordered in ED Medications  LORazepam (ATIVAN) injection 1 mg (1 mg Intravenous Given 09/14/20 0741)  ondansetron (ZOFRAN) injection 4 mg (4 mg Intravenous Given 09/14/20 0741)  fentaNYL (SUBLIMAZE) injection 50 mcg (50 mcg Intravenous Given 09/14/20 8453)    ED Course  I have reviewed the triage vital signs and the nursing notes.  Pertinent labs & imaging results that were available during my care of the patient were reviewed by me and considered in my medical decision making (see chart for details).    MDM Rules/Calculators/A&P                          MDM:  Pt given ativan for anxiety,  Pt given zofran for nausea.  Pt given fentanyl for pain.  Labs sodium low at 125. Pt given IV fluids x 1 liter,  Pt sitting up eating and drinking.   Pt advised to follow up with his MD for evaluation.    Final Clinical Impression(s) / ED Diagnoses Final diagnoses:  Hyponatremia  Nausea and vomiting, intractability of vomiting not specified, unspecified vomiting type    Rx / DC Orders ED Discharge Orders    None       Fransico Meadow, [place] 09/14/20 1123 An After  Visit Summary was printed and given to the patient.    Fransico Meadow, [place] 09/14/20 1446    Charlesetta Shanks, MD 09/15/20 1255

## 2020-09-14 NOTE — ED Notes (Signed)
Pt states he had 25 oz of beer last night and has been drinking every night 'for a while'. 2 25oz drinks per night. No drinks today. Denies seizures with prior withdrawals but endorses anxiety and tremors.

## 2020-09-14 NOTE — Discharge Instructions (Addendum)
Return if any problems. See your Physician for recheck next week

## 2020-09-14 NOTE — ED Notes (Signed)
Provided pt with graham crackers, peanut butter, and ginger ale

## 2020-09-14 NOTE — ED Triage Notes (Signed)
Patient BIB [facility] from home. Patient complaining of nausea and vomiting and chronic neck pain/headache.   EMS vitals 162/100 HR 80 18 RR 97% Room Air CBG 180

## 2020-09-20 ENCOUNTER — Emergency Department (HOSPITAL_COMMUNITY)
Admission: EM | Admit: 2020-09-20 | Discharge: 2020-09-20 | Disposition: A | Discharge status: Home / Self Care | Payer: Medicaid Other | Attending: Emergency Medicine | Admitting: Emergency Medicine

## 2020-09-20 ENCOUNTER — Other Ambulatory Visit: Payer: Self-pay

## 2020-09-20 ENCOUNTER — Encounter (HOSPITAL_COMMUNITY): Payer: Self-pay

## 2020-09-20 DIAGNOSIS — Z79899 Other long term (current) drug therapy: Secondary | ICD-10-CM | POA: Diagnosis not present

## 2020-09-20 DIAGNOSIS — R1110 Vomiting, unspecified: Secondary | ICD-10-CM | POA: Insufficient documentation

## 2020-09-20 DIAGNOSIS — F419 Anxiety disorder, unspecified: Secondary | ICD-10-CM | POA: Insufficient documentation

## 2020-09-20 DIAGNOSIS — E860 Dehydration: Secondary | ICD-10-CM

## 2020-09-20 DIAGNOSIS — Z87891 Personal history of nicotine dependence: Secondary | ICD-10-CM | POA: Insufficient documentation

## 2020-09-20 DIAGNOSIS — R630 Anorexia: Secondary | ICD-10-CM | POA: Insufficient documentation

## 2020-09-20 DIAGNOSIS — I10 Essential (primary) hypertension: Secondary | ICD-10-CM | POA: Insufficient documentation

## 2020-09-20 DIAGNOSIS — F0390 Unspecified dementia without behavioral disturbance: Secondary | ICD-10-CM | POA: Diagnosis not present

## 2020-09-20 LAB — BASIC METABOLIC PANEL
Anion gap: 9 (ref 5–15)
BUN: 7 mg/dL — ABNORMAL LOW (ref 8–23)
CO2: 28 mmol/L (ref 22–32)
Calcium: 9.2 mg/dL (ref 8.9–10.3)
Chloride: 99 mmol/L (ref 98–111)
Creatinine, Ser: 0.74 mg/dL (ref 0.61–1.24)
GFR, Estimated: >60 mL/min (ref >60)
Glucose, Bld: 96 mg/dL (ref 70–99)
Potassium: 4.1 mmol/L (ref 3.5–5.1)
Sodium: 136 mmol/L (ref 135–145)

## 2020-09-20 LAB — CBC WITH DIFFERENTIAL/PLATELET
Abs Immature Granulocytes: 0.02 10*3/uL (ref 0.00–0.07)
Basophils Absolute: 0.1 10*3/uL (ref 0.0–0.1)
Basophils Relative: 1 %
Eosinophils Absolute: 0.1 10*3/uL (ref 0.0–0.5)
Eosinophils Relative: 1 %
HCT: 41.1 % (ref 39.0–52.0)
Hemoglobin: 13.7 g/dL (ref 13.0–17.0)
Immature Granulocytes: 0 %
Lymphocytes Relative: 14 %
Lymphs Abs: 1 10*3/uL (ref 0.7–4.0)
MCH: 31.4 pg (ref 26.0–34.0)
MCHC: 33.3 g/dL (ref 30.0–36.0)
MCV: 94.1 fL (ref 80.0–100.0)
Monocytes Absolute: 0.7 10*3/uL (ref 0.1–1.0)
Monocytes Relative: 9 %
Neutro Abs: 5.4 10*3/uL (ref 1.7–7.7)
Neutrophils Relative %: 75 %
Platelets: 297 10*3/uL (ref 150–400)
RBC: 4.37 MIL/uL (ref 4.22–5.81)
RDW: 12.8 % (ref 11.5–15.5)
WBC: 7.2 10*3/uL (ref 4.0–10.5)
nRBC: 0 % (ref 0.0–0.2)

## 2020-09-20 MED ORDER — LORAZEPAM 2 MG/ML IJ SOLN
1.0000 mg | Freq: Once | INTRAMUSCULAR | Status: AC
  Administered 2020-09-20: 1 mg via INTRAVENOUS
  Filled 2020-09-20: qty 1

## 2020-09-20 MED ORDER — LACTATED RINGERS IV SOLN: INTRAVENOUS | Status: DC

## 2020-09-20 MED ORDER — LACTATED RINGERS IV BOLUS
1000.0000 mL | Freq: Once | INTRAVENOUS | Status: AC
  Administered 2020-09-20: 1000 mL via INTRAVENOUS

## 2020-09-20 MED ORDER — METOCLOPRAMIDE HCL 5 MG/ML IJ SOLN
5.0000 mg | Freq: Once | INTRAMUSCULAR | Status: AC
  Administered 2020-09-20: 5 mg via INTRAVENOUS
  Filled 2020-09-20: qty 2

## 2020-09-20 NOTE — ED Notes (Signed)
Brought pt snack and drink, pt stood and used urinal again without assistance

## 2020-09-20 NOTE — ED Notes (Signed)
Pt stood and used the urinal without assistance, steady on feet

## 2020-09-20 NOTE — ED Notes (Signed)
Pt ambulates to bathroom independently, steady gait

## 2020-09-20 NOTE — Progress Notes (Signed)
..   Transition of Care [facility]) - Emergency Department Mini Assessment   Patient Details  Name: Anthony Lamb MRN: [id number] Date of Birth: 1956/02/20  Transition of Care [facility]) CM/SW Contact:    Alauna Hayden C Tarpley-Carter, [place] Phone Number: 09/20/2020, 11:42 AM   Clinical Narrative: TOC CSW needs assistance with HH and food.  Henretter Piekarski Tarple[place]-Carter, MSW, LCSW-A Pronouns:  She, Her, Hers                  [facility] ED Transitions of CareClinical Social Worker [email or web address] [phone]   ED Mini Assessment: What brought you to the Emergency Department? : Headache  Barriers to Discharge: No Barriers Identified     Means of departure: Not know  Interventions which prevented an admission or readmission: [place] or Services    Patient Contact and Communications       Contact Date: 09/20/20,          Patient states their goals for this hospitalization and ongoing recovery are:: Pt needs assistance with food and HH.   Choice offered to / list presented to : NA  Admission diagnosis:  dehydration Patient Active Problem List   Diagnosis Date Noted  . Family history of colon cancer 12/02/2018  . Gastric polyps   . Schatzki's ring   . Left sided ulcerative colitis without complication ([place])   . Odynophagia 11/25/2018  . Dysphagia 11/25/2018  . Major depressive disorder, recurrent severe without psychotic features ([place]) 06/24/2018  . IBS (irritable bowel syndrome)   . Hypertension   . Colitis   . Adjustment disorder with mixed anxiety and depressed mood 05/11/2017  . Alcohol abuse   . Anxiety   . Gastroesophageal reflux disease   . Osteopenia determined by x-ray 08/06/2014  . Stress fracture of calcaneus 08/06/2014  . Vitamin D deficiency 12/18/2013  . Dementia ([place]) 12/18/2013  . Benign microscopic hematuria 07/06/2013  . SIADH (syndrome of inappropriate ADH production) ([place]) 06/22/2013  . Ankylosing spondylitis  ([place]) 06/20/2013  . Malnutrition of moderate degree ([place]) 06/20/2013  . BPH (benign prostatic hyperplasia) 08/22/2010  . History of colonic polyps 08/13/2008  . Essential hypertension 07/03/2007  . PUD (peptic ulcer disease) 07/03/2007   PCP:  Sandi Mariscal, MD Pharmacy:   [facility] [place], [place] - [address] [place] [address] Phone: [phone] Fax: [phone]  [place], [place] [address] Phone: [phone] Fax: [facility]son Wilson, [place] - [address] AT [facility] OF [address] [place] [address] Phone: [phone] Fax: [phone]

## 2020-09-20 NOTE — ED Triage Notes (Signed)
Pt c/o feeling dehydrated, was here on 4/12 for same. Pt states he has issues getting food brought to him at home so he "isn't eating or drinking well". Pt states he cannot drive to get his own groceries- possible social work issue. EMS stated pt was negative for orthostatics and bgl was [facility]

## 2020-09-20 NOTE — ED Notes (Signed)
Arranged safe transport for pt to go back to home, pt signed waiver, eta for vehicle is 1314, will give pt details of car and bring pt to [place] to wait for car

## 2020-09-20 NOTE — ED Provider Notes (Signed)
[place] DEPT Provider Note   CSN: [id number] Arrival date & time: 09/20/20  0736     History Chief Complaint  Patient presents with  . Dehydration    Anthony Lamb is a 65 y.o. male.  65 year old male presents with decreased oral intake along with emesis x2.  Patient has a history of chronic anxiety as well as chronic pain.  He is well-known to me.  Denies any fever or chills.  No SI or HI.  He is in pain management currently        Past Medical History:  Diagnosis Date  . Alcohol abuse, in remission   . Anal fissure   . Anemia   . Ankylosing spondylitis ([place])   . Anxiety   . Arthritis   . Chronic diarrhea   . Chronic headaches   . Chronic pain   . Colitis   . Colon polyps   . Depression   . Esophagitis   . Gastric polyps    hyperplastic and fundic gland  . Gastritis   . GERD (gastroesophageal reflux disease)   . Hypertension   . IBS (irritable bowel syndrome)   . Poor dentition   . SIADH (syndrome of inappropriate ADH production) [facility])     Patient Active Problem List   Diagnosis Date Noted  . Family history of colon cancer 12/02/2018  . Gastric polyps   . Schatzki's ring   . Left sided ulcerative colitis without complication ([place])   . Odynophagia 11/25/2018  . Dysphagia 11/25/2018  . 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 IBS (irritable bowel syndrome)   . Hypertension   . Colitis   . Adjustment disorder with mixed anxiety and depressed mood 05/11/2017  . Alcohol abuse   . Anxiety   . Gastroesophageal reflux disease   . Osteopenia determined by x-ray 08/06/2014  . Stress fracture of calcaneus 08/06/2014  . Vitamin D deficiency 12/18/2013  . Dementia ([place]) 12/18/2013  . Benign microscopic hematuria 07/06/2013  . SIADH (syndrome of inappropriate ADH production) ([place]) 06/22/2013  . Ankylosing spondylitis ([place]) 06/20/2013  . Malnutrition of moderate degree  ([place]) 06/20/2013  . BPH (benign prostatic hyperplasia) 08/22/2010  . History of colonic polyps 08/13/2008  . Essential hypertension 07/03/2007  . PUD (peptic ulcer disease) 07/03/2007    Past Surgical History:  Procedure Laterality Date  . BIOPSY  11/26/2018   Procedure: BIOPSY;  Surgeon: Gatha Mayer, MD;  Location: [facility];  Service: Endoscopy;;  . COLONOSCOPY W/ BIOPSIES    . COLONOSCOPY WITH PROPOFOL N/A 11/26/2018   Procedure: COLONOSCOPY WITH PROPOFOL;  Surgeon: Gatha Mayer, MD;  [facility];  Service: Endoscopy;  Laterality: N/A;  . ESOPHAGOGASTRODUODENOSCOPY    . ESOPHAGOGASTRODUODENOSCOPY (EGD) WITH PROPOFOL N/A 11/26/2018   Procedure: ESOPHAGOGASTRODUODENOSCOPY (EGD) WITH PROPOFOL;  Surgeon: Gatha Mayer, MD;  [facility];  Service: Endoscopy;  Laterality: N/A;  . HEMOSTASIS CLIP PLACEMENT  11/26/2018   Procedure: HEMOSTASIS CLIP PLACEMENT;  Surgeon: Gatha Mayer, MD;  [facility];  Service: Endoscopy;;  . HERNIA REPAIR Bilateral   . HOT HEMOSTASIS N/A 11/26/2018   Procedure: HOT HEMOSTASIS (ARGON PLASMA COAGULATION/BICAP);  Surgeon: Gatha Mayer, MD;  Location: [facility];  Service: Endoscopy;  Laterality: N/A;  . OPEN REDUCTION INTERNAL FIXATION (ORIF) DISTAL RADIAL FRACTURE Right 02/11/2018   Procedure: OPEN REDUCTION INTERNAL FIXATION (ORIF) DISTAL RADIAL FRACTURE;  Surgeon: Milly Jakob, MD;  Location: [place];  Service: Orthopedics;  Laterality: Right;  . POLYPECTOMY  11/26/2018   Procedure: POLYPECTOMY;  Surgeon: Gatha Mayer, MD;  Location: [facility];  Service: Endoscopy;;  . TONSILLECTOMY         Family History  Problem Relation Age of Onset  . Anxiety disorder Mother   . Congestive Heart Failure Mother   . Crohn's disease Mother   . Colon cancer Mother 64  . Arthritis Father   . High blood pressure Father   . Crohn's disease Father     Social History   Tobacco Use  . Smoking status: Former Smoker     Types: Cigarettes    Quit date: 2014    Years since quitting: 8.2  . Smokeless tobacco: Never Used  Vaping Use  . Vaping Use: Never used  Substance Use Topics  . Alcohol use: Yes    Comment: 1 25 oz of beer a night  . Drug use: No    Home Medications Prior to Admission medications   Medication Sig Start Date End Date Taking? Authorizing Provider  acetaminophen (TYLENOL) 325 MG tablet Take 2 tablets (650 mg total) by mouth every 6 (six) hours as needed for mild pain. (May buy over the counter) 06/27/18   Connye Burkitt, NP  amoxicillin-clavulanate (AUGMENTIN) 875-125 MG tablet Take 1 tablet by mouth 2 (two) times daily for 10 days. 09/14/20 09/24/20  Fransico Meadow, PA-C  brexpiprazole (REXULTI) 2 MG TABS tablet Take 2 mg by mouth daily.     [provider]  clonazePAM (KLONOPIN) 1 MG tablet Take 1 mg by mouth 3 (three) times daily.    [provider]  DULoxetine (CYMBALTA) 30 MG capsule Take 60 mg by mouth daily.     [provider]  Eszopiclone 3 MG TABS Take 3 mg by mouth at bedtime. 08/24/20   [provider]  lisinopril (PRINIVIL,ZESTRIL) 10 MG tablet Take 1 tablet (10 mg total) by mouth daily. Patient taking differently: Take 10 mg by mouth at bedtime.  08/21/17 05/02/20  Melanee Spry, MD  Multiple Vitamin (MULTIVITAMIN WITH MINERALS) TABS tablet Take 1 tablet by mouth daily. (May buy over the counter) 06/28/18   Connye Burkitt, NP  pantoprazole (PROTONIX) 40 MG tablet Take 1 tablet (40 mg total) by mouth daily. 04/24/14   Janith Lima, MD  propranolol (INDERAL) 10 MG tablet Take 10 mg by mouth 2 (two) times daily. 09/03/20   [provider]  QUEtiapine (SEROQUEL) 300 MG tablet Take 300 mg by mouth at bedtime.     [provider]  sodium chloride (OCEAN) 0.65 % SOLN nasal spray Place 2 sprays into both nostrils 4 (four) times daily as needed for congestion.     [provider]  sucralfate (CARAFATE) 1 G tablet Take  1 tablet (1 g total) by mouth 4 (four) times daily -  with meals and at bedtime. Chew before swallowing Patient taking differently: Take 1 g by mouth 4 (four) times daily. Chew before swallowing 12/17/13   Janith Lima, MD  tiZANidine (ZANAFLEX) 4 MG tablet Take 2 mg by mouth 3 (three) times daily.    [provider]  verapamil (CALAN-SR) 240 MG CR tablet Take 240 mg by mouth at bedtime. 02/09/17   [provider]    Allergies    Diphenhydramine hcl, Flexeril [cyclobenzaprine], Hctz [hydrochlorothiazide], Nsaids, Gabapentin, and Hydroxyzine  Review of Systems   Review of Systems  All other systems reviewed and are negative.   Physical Exam  Updated Vital Signs BP (!) 163/83 (BP Location: Left Arm)   Pulse 87   Temp 98.2 F (36.8 C) (Oral)   Resp 16   Ht 1.778 m (5' 10" )   Wt 77 kg   SpO2 99%   BMI 24.36 kg/m   Physical Exam Vitals and nursing note reviewed.  Constitutional:      General: He is not in acute distress.    Appearance: Normal appearance. He is well-developed. He is not toxic-appearing.  HENT:     Head: Normocephalic and atraumatic.  Eyes:     General: Lids are normal.     Conjunctiva/sclera: Conjunctivae normal.     Pupils: Pupils are equal, round, and reactive to light.  Neck:     Thyroid: No thyroid mass.     Trachea: No tracheal deviation.  Cardiovascular:     Rate and Rhythm: Normal rate and regular rhythm.     Heart sounds: Normal heart sounds. No murmur heard. No gallop.   Pulmonary:     Effort: Pulmonary effort is normal. No respiratory distress.     Breath sounds: Normal breath sounds. No stridor. No decreased breath sounds, wheezing, rhonchi or rales.  Abdominal:     General: Bowel sounds are normal. There is no distension.     Palpations: Abdomen is soft.     Tenderness: There is no abdominal tenderness. There is no rebound.  Musculoskeletal:        General: No tenderness. Normal range of motion.     Cervical back: Normal  range of motion and neck supple.  Skin:    General: Skin is warm and dry.     Findings: No abrasion or rash.  Neurological:     Mental Status: He is alert and oriented to person, place, and time.     GCS: GCS eye subscore is 4. GCS verbal subscore is 5. GCS motor subscore is 6.     Cranial Nerves: No cranial nerve deficit.     Sensory: No sensory deficit.  Psychiatric:        Attention and Perception: Attention normal.        Mood and Affect: Mood is anxious.        Speech: Speech normal.        Behavior: Behavior normal.     ED Results / Procedures / Treatments   Labs (all labs ordered are listed, but only abnormal results are displayed) Labs Reviewed  CBC WITH DIFFERENTIAL/PLATELET  BASIC METABOLIC PANEL    EKG None  Radiology No results found.  Procedures Procedures   Medications Ordered in ED Medications  lactated ringers infusion (has no administration in time range)  LORazepam (ATIVAN) injection 1 mg (has no administration in time range)  lactated ringers bolus 1,000 mL (1,000 mLs Intravenous New Bag/Given 09/20/20 0830)    ED Course  I have reviewed the triage vital signs and the nursing notes.  Pertinent labs & imaging results that were available during my care of the patient were reviewed by me and considered in my medical decision making (see chart for details).    MDM Rules/Calculators/A&P                          Patient given IV fluids and also medication for anxiety.  Has been seen by social work.  Will discharge Final Clinical Impression(s) / ED Diagnoses Final diagnoses:  None    Rx / DC Orders ED Discharge Orders  None       Lacretia Leigh, MD 09/20/20 1213

## 2020-09-20 NOTE — ED Notes (Signed)
SW spoke w pt

## 2020-10-06 ENCOUNTER — Encounter (HOSPITAL_COMMUNITY): Payer: Self-pay

## 2020-10-06 ENCOUNTER — Emergency Department (HOSPITAL_COMMUNITY)
Admission: EM | Admit: 2020-10-06 | Discharge: 2020-10-06 | Disposition: A | Discharge status: Home / Self Care | Payer: Medicaid Other | Attending: Emergency Medicine | Admitting: Emergency Medicine

## 2020-10-06 DIAGNOSIS — M542 Cervicalgia: Secondary | ICD-10-CM | POA: Diagnosis present

## 2020-10-06 DIAGNOSIS — M62838 Other muscle spasm: Secondary | ICD-10-CM | POA: Diagnosis not present

## 2020-10-06 DIAGNOSIS — Z79899 Other long term (current) drug therapy: Secondary | ICD-10-CM | POA: Insufficient documentation

## 2020-10-06 DIAGNOSIS — Z87891 Personal history of nicotine dependence: Secondary | ICD-10-CM | POA: Diagnosis not present

## 2020-10-06 DIAGNOSIS — F0390 Unspecified dementia without behavioral disturbance: Secondary | ICD-10-CM | POA: Diagnosis not present

## 2020-10-06 DIAGNOSIS — I10 Essential (primary) hypertension: Secondary | ICD-10-CM | POA: Diagnosis not present

## 2020-10-06 LAB — BASIC METABOLIC PANEL
Anion gap: 10 (ref 5–15)
BUN: 8 mg/dL (ref 8–23)
CO2: 29 mmol/L (ref 22–32)
Calcium: 9.8 mg/dL (ref 8.9–10.3)
Chloride: 101 mmol/L (ref 98–111)
Creatinine, Ser: 0.81 mg/dL (ref 0.61–1.24)
GFR, Estimated: >60 mL/min (ref >60)
Glucose, Bld: 97 mg/dL (ref 70–99)
Potassium: 4 mmol/L (ref 3.5–5.1)
Sodium: 140 mmol/L (ref 135–145)

## 2020-10-06 LAB — CBC
HCT: 41.2 % (ref 39.0–52.0)
Hemoglobin: 13.8 g/dL (ref 13.0–17.0)
MCH: 31.2 pg (ref 26.0–34.0)
MCHC: 33.5 g/dL (ref 30.0–36.0)
MCV: 93.2 fL (ref 80.0–100.0)
Platelets: 272 10*3/uL (ref 150–400)
RBC: 4.42 MIL/uL (ref 4.22–5.81)
RDW: 12.7 % (ref 11.5–15.5)
WBC: 8.7 10*3/uL (ref 4.0–10.5)
nRBC: 0 % (ref 0.0–0.2)

## 2020-10-06 MED ORDER — FENTANYL CITRATE (PF) 100 MCG/2ML IJ SOLN
25.0000 ug | Freq: Once | INTRAMUSCULAR | Status: AC
Start: 2020-10-06 — End: 2020-10-06
  Administered 2020-10-06: 25 ug via INTRAVENOUS
  Filled 2020-10-06: qty 2

## 2020-10-06 MED ORDER — METHOCARBAMOL 500 MG PO TABS: 500.0000 mg | ORAL_TABLET | Freq: Three times a day (TID) | ORAL | 0 refills | Status: DC | PRN

## 2020-10-06 MED ORDER — OXYCODONE-ACETAMINOPHEN 5-325 MG PO TABS
1.0000 | ORAL_TABLET | Freq: Once | ORAL | Status: AC
  Administered 2020-10-06: 1 via ORAL
  Filled 2020-10-06: qty 1

## 2020-10-06 MED ORDER — LORAZEPAM 2 MG/ML IJ SOLN
0.5000 mg | Freq: Once | INTRAMUSCULAR | Status: AC
  Administered 2020-10-06: 0.5 mg via INTRAVENOUS
  Filled 2020-10-06: qty 1

## 2020-10-06 MED ORDER — LORAZEPAM 2 MG/ML IJ SOLN
1.0000 mg | Freq: Once | INTRAMUSCULAR | Status: AC
  Administered 2020-10-06: 1 mg via INTRAMUSCULAR
  Filled 2020-10-06: qty 1

## 2020-10-06 NOTE — ED Notes (Signed)
Pt is cleared for d/c by ED provider. Pt states he does not have a ride home and his cell phone is dead. This nurse verified with [facility] that pt could either call himself a cab, wait in the lobby until social work arrives at 11:00am, or take a bus pass. The pt states he does not want to wait for social work, does not want to take a bus because it's too far from his apt, and states he cannot call a cab because his cell phone is dead. This nurse gave the pt the ED room phone to use and patient states he has the "cab number memorized." This nurse witnessed the pt call a cab in the ED exam room. Per pt, cab company to pick him up in the lobby.

## 2020-10-06 NOTE — Discharge Instructions (Addendum)
For your neck pain, would recommend Tylenol or Motrin as needed for pain control.  Recommend following up with a spine specialist as well as your primary care doctor.  If you develop uncontrolled pain, numbness, weakness, or other new concerning symptom, return to ER for reassessment.  Take muscle relaxer as needed.  Note this can make you slightly drowsy and should not be taken while driving or operating heavy machinery.

## 2020-10-06 NOTE — ED Triage Notes (Signed)
Pt presents from home via EMS with a 4-5 month hx of neck pain. Pt has not been evaluated for his symptoms. Pt has an appt with his PCP tomorrow for this issue.

## 2020-10-06 NOTE — ED Provider Notes (Signed)
[facility] DEPT Provider Note   CSN: [id number] Arrival date & time: 10/06/20  0711     History Chief Complaint  Patient presents with  . Torticollis    Neck pain for the last 4-5 months. Additionally c/o neck pain with ambulation.     Anthony Lamb is a 65 y.o. male.  Presents to ER with concern for acute on chronic neck pain.  Patient reports that he struggled with neck pain for many months.  Has been worsening over the past couple weeks.  Pain is worse with certain movements.  No alleviating factors.  Has not taken medicine for this today.  Denies any numbness, weakness, speech or vision changes.  Concerned he is dehydrated.  No nausea vomiting diarrhea or abdominal pain.  Per review of chart patient has a history of ankylosing spondylitis, alcohol abuse, GERD, hypertension, IBS  HPI     Past Medical History:  Diagnosis Date  . Alcohol abuse, in remission   . Anal fissure   . Anemia   . Ankylosing spondylitis ([place])   . Anxiety   . Arthritis   . Chronic diarrhea   . Chronic headaches   . Chronic pain   . Colitis   . Colon polyps   . Depression   . Esophagitis   . Gastric polyps    hyperplastic and fundic gland  . Gastritis   . GERD (gastroesophageal reflux disease)   . Hypertension   . IBS (irritable bowel syndrome)   . Poor dentition   . SIADH (syndrome of inappropriate ADH production) [facility])     Patient Active Problem List   Diagnosis Date Noted  . Family history of colon cancer 12/02/2018  . Gastric polyps   . Schatzki's ring   . Left sided ulcerative colitis without complication ([place])   . Odynophagia 11/25/2018  . Dysphagia 11/25/2018  . 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 IBS (irritable bowel syndrome)   . Hypertension   . Colitis   . Adjustment disorder with mixed anxiety and depressed mood 05/11/2017  . Alcohol abuse   . Anxiety   . Gastroesophageal reflux  disease   . Osteopenia determined by x-ray 08/06/2014  . Stress fracture of calcaneus 08/06/2014  . Vitamin D deficiency 12/18/2013  . Dementia ([place]) 12/18/2013  . Benign microscopic hematuria 07/06/2013  . SIADH (syndrome of inappropriate ADH production) ([place]) 06/22/2013  . Ankylosing spondylitis ([place]) 06/20/2013  . Malnutrition of moderate degree ([place]) 06/20/2013  . BPH (benign prostatic hyperplasia) 08/22/2010  . History of colonic polyps 08/13/2008  . Essential hypertension 07/03/2007  . PUD (peptic ulcer disease) 07/03/2007    Past Surgical History:  Procedure Laterality Date  . BIOPSY  11/26/2018   Procedure: BIOPSY;  Surgeon: Gatha Mayer, MD;  [facility];  Service: Endoscopy;;  . COLONOSCOPY W/ BIOPSIES    . COLONOSCOPY WITH PROPOFOL N/A 11/26/2018   Procedure: COLONOSCOPY WITH PROPOFOL;  Surgeon: Gatha Mayer, MD;  [facility];  Service: Endoscopy;  Laterality: N/A;  . ESOPHAGOGASTRODUODENOSCOPY    . ESOPHAGOGASTRODUODENOSCOPY (EGD) WITH PROPOFOL N/A 11/26/2018   Procedure: ESOPHAGOGASTRODUODENOSCOPY (EGD) WITH PROPOFOL;  Surgeon: Gatha Mayer, MD;  [facility];  Service: Endoscopy;  Laterality: N/A;  . HEMOSTASIS CLIP PLACEMENT  11/26/2018   Procedure: HEMOSTASIS CLIP PLACEMENT;  Surgeon: Gatha Mayer, MD;  [facility];  Service: Endoscopy;;  . HERNIA REPAIR Bilateral   . HOT HEMOSTASIS N/A 11/26/2018  Procedure: HOT HEMOSTASIS (ARGON PLASMA COAGULATION/BICAP);  Surgeon: Gatha Mayer, MD;  Location: [facility];  Service: Endoscopy;  Laterality: N/A;  . OPEN REDUCTION INTERNAL FIXATION (ORIF) DISTAL RADIAL FRACTURE Right 02/11/2018   Procedure: OPEN REDUCTION INTERNAL FIXATION (ORIF) DISTAL RADIAL FRACTURE;  Surgeon: Milly Jakob, MD;  Location: [facility];  Service: Orthopedics;  Laterality: Right;  . POLYPECTOMY  11/26/2018   Procedure: POLYPECTOMY;  Surgeon: Gatha Mayer, MD;  Location: [facility];  Service:  Endoscopy;;  . TONSILLECTOMY         Family History  Problem Relation Age of Onset  . Anxiety disorder Mother   . Congestive Heart Failure Mother   . Crohn's disease Mother   . Colon cancer Mother 25  . Arthritis Father   . High blood pressure Father   . Crohn's disease Father     Social History   Tobacco Use  . Smoking status: Former Smoker    Types: Cigarettes    Quit date: 2014    Years since quitting: 8.3  . Smokeless tobacco: Never Used  Vaping Use  . Vaping Use: Never used  Substance Use Topics  . Alcohol use: Yes    Comment: 1 25 oz of beer a night  . Drug use: No    Home Medications Prior to Admission medications   Medication Sig Start Date End Date Taking? Authorizing Provider  acetaminophen (TYLENOL) 325 MG tablet Take 2 tablets (650 mg total) by mouth every 6 (six) hours as needed for mild pain. (May buy over the counter) 06/27/18  Yes Connye Burkitt, NP  brexpiprazole (REXULTI) 2 MG TABS tablet Take 2 mg by mouth daily.    Yes [provider]  clonazePAM (KLONOPIN) 1 MG tablet Take 1 mg by mouth 3 (three) times daily.   Yes [provider]  DULoxetine (CYMBALTA) 30 MG capsule Take 60 mg by mouth daily.    Yes [provider]  Eszopiclone 3 MG TABS Take 3 mg by mouth at bedtime. 08/24/20  Yes [provider]  lisinopril (PRINIVIL,ZESTRIL) 10 MG tablet Take 1 tablet (10 mg total) by mouth daily. Patient taking differently: Take 10 mg by mouth at bedtime. 08/21/17 05/02/20 Yes Lacroce, Hulen Shouts, MD  methocarbamol (ROBAXIN) 500 MG tablet Take 1 tablet (500 mg total) by mouth every 8 (eight) hours as needed for muscle spasms. 10/06/20  Yes Lucrezia Starch, MD  Multiple Vitamin (MULTIVITAMIN WITH MINERALS) TABS tablet Take 1 tablet by mouth daily. (May buy over the counter) 06/28/18  Yes Connye Burkitt, NP  pantoprazole (PROTONIX) 40 MG tablet Take 1 tablet (40 mg total) by mouth daily. 04/24/14  Yes Janith Lima, MD   propranolol (INDERAL) 10 MG tablet Take 10 mg by mouth 2 (two) times daily. 09/03/20  Yes [provider]  QUEtiapine (SEROQUEL) 300 MG tablet Take 300 mg by mouth at bedtime.    Yes [provider]  sodium chloride (OCEAN) 0.65 % SOLN nasal spray Place 2 sprays into both nostrils 4 (four) times daily as needed for congestion.    Yes [provider]  sucralfate (CARAFATE) 1 G tablet Take 1 tablet (1 g total) by mouth 4 (four) times daily -  with meals and at bedtime. Chew before swallowing Patient taking differently: Take 1 g by mouth 4 (four) times daily. Chew before swallowing 12/17/13  Yes Janith Lima, MD  tiZANidine (ZANAFLEX) 4 MG tablet Take 2 mg by mouth 3 (three) times daily.  Yes [provider]  verapamil (CALAN-SR) 240 MG CR tablet Take 240 mg by mouth at bedtime. 02/09/17  Yes [provider]    Allergies    Diphenhydramine hcl, Flexeril [cyclobenzaprine], Hctz [hydrochlorothiazide], Nsaids, Gabapentin, and Hydroxyzine  Review of Systems   Review of Systems  Constitutional: Negative for chills and fever.  HENT: Negative for ear pain and sore throat.   Eyes: Negative for pain and visual disturbance.  Respiratory: Negative for cough and shortness of breath.   Cardiovascular: Negative for chest pain and palpitations.  Gastrointestinal: Negative for abdominal pain and vomiting.  Genitourinary: Negative for dysuria and hematuria.  Musculoskeletal: Positive for neck pain. Negative for arthralgias and back pain.  Skin: Negative for color change and rash.  Neurological: Negative for seizures and syncope.  All other systems reviewed and are negative.   Physical Exam Updated Vital Signs BP (!) 155/79   Pulse 73   Temp (!) 97.5 F (36.4 C)   Resp 17   Ht 5' 10"  (1.778 m)   Wt 72.6 kg   SpO2 98%   BMI 22.96 kg/m   Physical Exam Vitals and nursing note reviewed.  Constitutional:      Appearance: He is well-developed.  HENT:      Head: Normocephalic and atraumatic.  Eyes:     Conjunctiva/sclera: Conjunctivae normal.  Neck:     Comments: Left lateral neck muscle tenderness Cardiovascular:     Rate and Rhythm: Normal rate.     Pulses: Normal pulses.  Pulmonary:     Effort: Pulmonary effort is normal. No respiratory distress.  Abdominal:     Palpations: Abdomen is soft.     Tenderness: There is no abdominal tenderness.  Musculoskeletal:     Cervical back: Normal range of motion and neck supple. No rigidity.     Comments: Some tenderness to palpation noted over the left posterior lateral neck, no midline C, T or L-spine tenderness  Skin:    General: Skin is warm and dry.  Neurological:     Mental Status: He is alert.     ED Results / Procedures / Treatments   Labs (all labs ordered are listed, but only abnormal results are displayed) Labs Reviewed  CBC  BASIC METABOLIC PANEL    EKG None  Radiology No results found.  Procedures Procedures   Medications Ordered in ED Medications  LORazepam (ATIVAN) injection 0.5 mg (has no administration in time range)  fentaNYL (SUBLIMAZE) injection 25 mcg (has no administration in time range)  LORazepam (ATIVAN) injection 1 mg (1 mg Intramuscular Given 10/06/20 0756)  oxyCODONE-acetaminophen (PERCOCET/ROXICET) 5-325 MG per tablet 1 tablet (1 tablet Oral Given 10/06/20 0756)    ED Course  I have reviewed the triage vital signs and the nursing notes.  Pertinent labs & imaging results that were available during my care of the patient were reviewed by me and considered in my medical decision making (see chart for details).    MDM Rules/Calculators/A&P                         65 year old male presenting to ER with concern for neck pain, muscle spasm.  On physical exam, patient is somewhat disheveled but in no acute distress.  Tenderness noted to his posterior lateral left neck region.  Based on symptomatology, suspect muscle spasm is most likely.  Given his  reported chronicity of symptoms, recommend follow-up with neurosurgery.  Symptoms improved after dose of pain medicine and  benzodiazepine.  He is neurovascularly intact.  Denied trauma.  Basic labs stable.  Stable for outpatient management, discharged home.  Additionally recommended recheck with primary doctor.     After the discussed management above, the patient was determined to be safe for discharge.  The patient was in agreement with this plan and all questions regarding their care were answered.  ED return precautions were discussed and the patient will return to the ED with any significant worsening of condition.   Final Clinical Impression(s) / ED Diagnoses Final diagnoses:  Neck muscle spasm    Rx / DC Orders ED Discharge Orders         Ordered    methocarbamol (ROBAXIN) 500 MG tablet  Every 8 hours PRN        10/06/20 0933           Lucrezia Starch, MD 10/06/20 [phone]

## 2020-11-01 ENCOUNTER — Emergency Department (HOSPITAL_COMMUNITY): Payer: Medicaid Other

## 2020-11-01 ENCOUNTER — Other Ambulatory Visit: Payer: Self-pay

## 2020-11-01 ENCOUNTER — Encounter (HOSPITAL_COMMUNITY): Payer: Self-pay | Admitting: Emergency Medicine

## 2020-11-01 ENCOUNTER — Emergency Department (HOSPITAL_COMMUNITY)
Admission: EM | Admit: 2020-11-01 | Discharge: 2020-11-01 | Disposition: A | Discharge status: Home / Self Care | Payer: Medicaid Other | Attending: Emergency Medicine | Admitting: Emergency Medicine

## 2020-11-01 DIAGNOSIS — I10 Essential (primary) hypertension: Secondary | ICD-10-CM | POA: Diagnosis not present

## 2020-11-01 DIAGNOSIS — M542 Cervicalgia: Secondary | ICD-10-CM | POA: Diagnosis present

## 2020-11-01 DIAGNOSIS — Z87891 Personal history of nicotine dependence: Secondary | ICD-10-CM | POA: Insufficient documentation

## 2020-11-01 DIAGNOSIS — Z79899 Other long term (current) drug therapy: Secondary | ICD-10-CM | POA: Diagnosis not present

## 2020-11-01 LAB — I-STAT CHEM 8, ED
BUN: 7 mg/dL — ABNORMAL LOW (ref 8–23)
Calcium, Ion: 1.07 mmol/L — ABNORMAL LOW (ref 1.15–1.40)
Chloride: 98 mmol/L (ref 98–111)
Creatinine, Ser: 0.7 mg/dL (ref 0.61–1.24)
Glucose, Bld: 112 mg/dL — ABNORMAL HIGH (ref 70–99)
HCT: 40 % (ref 39.0–52.0)
Hemoglobin: 13.6 g/dL (ref 13.0–17.0)
Potassium: 4.5 mmol/L (ref 3.5–5.1)
Sodium: 133 mmol/L — ABNORMAL LOW (ref 135–145)
TCO2: 28 mmol/L (ref 22–32)

## 2020-11-01 MED ORDER — OXYCODONE-ACETAMINOPHEN 5-325 MG PO TABS
1.0000 | ORAL_TABLET | Freq: Once | ORAL | Status: AC
  Administered 2020-11-01: 1 via ORAL
  Filled 2020-11-01: qty 1

## 2020-11-01 MED ORDER — PREDNISONE 20 MG PO TABS: 40.0000 mg | ORAL_TABLET | Freq: Every day | ORAL | 0 refills | Status: DC

## 2020-11-01 MED ORDER — OXYCODONE-ACETAMINOPHEN 5-325 MG PO TABS
1.0000 | ORAL_TABLET | Freq: Once | ORAL | Status: AC
Start: 2020-11-01 — End: 2020-11-01
  Administered 2020-11-01: 1 via ORAL
  Filled 2020-11-01: qty 1

## 2020-11-01 MED ORDER — LORAZEPAM 1 MG PO TABS
1.0000 mg | ORAL_TABLET | Freq: Once | ORAL | Status: AC
  Administered 2020-11-01: 1 mg via ORAL
  Filled 2020-11-01: qty 1

## 2020-11-01 NOTE — ED Notes (Signed)
Notified Dr. Alvino Chapel the Chem 8 Results

## 2020-11-01 NOTE — Discharge Instructions (Signed)
It appears you have ankylosing spondylitis.  You may benefit from specialty care such as rheumatology.  Your primary care doctor should be able to help arrange this.

## 2020-11-01 NOTE — ED Provider Notes (Signed)
[facility] DEPT Provider Note   CSN: [id number] Arrival date & time: 11/01/20  6503     History Chief Complaint  Patient presents with  . Neck Pain    Anthony Lamb is a 65 y.o. male.  HPI Patient presents with neck pain.  Acute on chronic.  Worse with movements.  States he has ankylosing spondylitis.  States he was diagnosed in the ER.  Sees only his PCP.  Has not seen a specialist.  No relief with Tylenol at home.  States worse with movement.  No numbness or weakness. Also feels as if he is dehydrated.  States he has had less oral intake.  States he just does not have much appetite.  No fevers or chills.  Has a history of SIADH and hyponatremia. Patient also states he has a history of anxiety.  States he gets more anxious.  States he needs something for anxiety and something for the pain.    Past Medical History:  Diagnosis Date  . Alcohol abuse, in remission   . Anal fissure   . Anemia   . Ankylosing spondylitis ([place])   . Anxiety   . Arthritis   . Chronic diarrhea   . Chronic headaches   . Chronic pain   . Colitis   . Colon polyps   . Depression   . Esophagitis   . Gastric polyps    hyperplastic and fundic gland  . Gastritis   . GERD (gastroesophageal reflux disease)   . Hypertension   . IBS (irritable bowel syndrome)   . Poor dentition   . SIADH (syndrome of inappropriate ADH production) [facility])     Patient Active Problem List   Diagnosis Date Noted  . Family history of colon cancer 12/02/2018  . Gastric polyps   . Schatzki's ring   . Left sided ulcerative colitis without complication ([place])   . Odynophagia 11/25/2018  . Dysphagia 11/25/2018  . 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 IBS (irritable bowel syndrome)   . Hypertension   . Colitis   . Adjustment disorder with mixed anxiety and depressed mood 05/11/2017  . Alcohol abuse   . Anxiety   . Gastroesophageal reflux  disease   . Osteopenia determined by x-ray 08/06/2014  . Stress fracture of calcaneus 08/06/2014  . Vitamin D deficiency 12/18/2013  . Dementia ([place]) 12/18/2013  . Benign microscopic hematuria 07/06/2013  . SIADH (syndrome of inappropriate ADH production) ([place]) 06/22/2013  . Ankylosing spondylitis ([place]) 06/20/2013  . Malnutrition of moderate degree ([place]) 06/20/2013  . BPH (benign prostatic hyperplasia) 08/22/2010  . History of colonic polyps 08/13/2008  . Essential hypertension 07/03/2007  . PUD (peptic ulcer disease) 07/03/2007    Past Surgical History:  Procedure Laterality Date  . BIOPSY  11/26/2018   Procedure: BIOPSY;  Surgeon: Gatha Mayer, MD;  Location: [facility];  Service: Endoscopy;;  . COLONOSCOPY W/ BIOPSIES    . COLONOSCOPY WITH PROPOFOL N/A 11/26/2018   Procedure: COLONOSCOPY WITH PROPOFOL;  Surgeon: Gatha Mayer, MD;  [facility]: [facility];  Service: Endoscopy;  Laterality: N/A;  . ESOPHAGOGASTRODUODENOSCOPY    . ESOPHAGOGASTRODUODENOSCOPY (EGD) WITH PROPOFOL N/A 11/26/2018   Procedure: ESOPHAGOGASTRODUODENOSCOPY (EGD) WITH PROPOFOL;  Surgeon: Gatha Mayer, MD;  [facility];  Service: Endoscopy;  Laterality: N/A;  . HEMOSTASIS CLIP PLACEMENT  11/26/2018   Procedure: HEMOSTASIS CLIP PLACEMENT;  Surgeon: Gatha Mayer, MD;  [facility];  Service: Endoscopy;;  . HERNIA  REPAIR Bilateral   . HOT HEMOSTASIS N/A 11/26/2018   Procedure: HOT HEMOSTASIS (ARGON PLASMA COAGULATION/BICAP);  Surgeon: Gatha Mayer, MD;  Location: [facility];  Service: Endoscopy;  Laterality: N/A;  . OPEN REDUCTION INTERNAL FIXATION (ORIF) DISTAL RADIAL FRACTURE Right 02/11/2018   Procedure: OPEN REDUCTION INTERNAL FIXATION (ORIF) DISTAL RADIAL FRACTURE;  Surgeon: Milly Jakob, MD;  Location: [facility];  Service: Orthopedics;  Laterality: Right;  . POLYPECTOMY  11/26/2018   Procedure: POLYPECTOMY;  Surgeon: Gatha Mayer, MD;  Location: [facility];  Service:  Endoscopy;;  . TONSILLECTOMY         Family History  Problem Relation Age of Onset  . Anxiety disorder Mother   . Congestive Heart Failure Mother   . Crohn's disease Mother   . Colon cancer Mother 72  . Arthritis Father   . High blood pressure Father   . Crohn's disease Father     Social History   Tobacco Use  . Smoking status: Former Smoker    Types: Cigarettes    Quit date: 2014    Years since quitting: 8.4  . Smokeless tobacco: Never Used  Vaping Use  . Vaping Use: Never used  Substance Use Topics  . Alcohol use: Yes    Comment: 1 25 oz of beer a night  . Drug use: No    Home Medications Prior to Admission medications   Medication Sig Start Date End Date Taking? Authorizing Provider  predniSONE (DELTASONE) 20 MG tablet Take 2 tablets (40 mg total) by mouth daily. 11/01/20  Yes Davonna Belling, MD  acetaminophen (TYLENOL) 325 MG tablet Take 2 tablets (650 mg total) by mouth every 6 (six) hours as needed for mild pain. (May buy over the counter) 06/27/18   Connye Burkitt, NP  brexpiprazole (REXULTI) 2 MG TABS tablet Take 2 mg by mouth daily.     [provider]  clonazePAM (KLONOPIN) 1 MG tablet Take 1 mg by mouth 3 (three) times daily.    [provider]  DULoxetine (CYMBALTA) 30 MG capsule Take 60 mg by mouth daily.     [provider]  Eszopiclone 3 MG TABS Take 3 mg by mouth at bedtime. 08/24/20   [provider]  lisinopril (PRINIVIL,ZESTRIL) 10 MG tablet Take 1 tablet (10 mg total) by mouth daily. Patient taking differently: Take 10 mg by mouth at bedtime. 08/21/17 05/02/20  Lacroce, Hulen Shouts, MD  methocarbamol (ROBAXIN) 500 MG tablet Take 1 tablet (500 mg total) by mouth every 8 (eight) hours as needed for muscle spasms. 10/06/20   Lucrezia Starch, MD  Multiple Vitamin (MULTIVITAMIN WITH MINERALS) TABS tablet Take 1 tablet by mouth daily. (May buy over the counter) 06/28/18   Connye Burkitt, NP  pantoprazole (PROTONIX) 40 MG  tablet Take 1 tablet (40 mg total) by mouth daily. 04/24/14   Janith Lima, MD  propranolol (INDERAL) 10 MG tablet Take 10 mg by mouth 2 (two) times daily. 09/03/20   [provider]  QUEtiapine (SEROQUEL) 300 MG tablet Take 300 mg by mouth at bedtime.     [provider]  sodium chloride (OCEAN) 0.65 % SOLN nasal spray Place 2 sprays into both nostrils 4 (four) times daily as needed for congestion.     [provider]  sucralfate (CARAFATE) 1 G tablet Take 1 tablet (1 g total) by mouth 4 (four) times daily -  with meals and at bedtime. Chew before swallowing Patient taking differently: Take 1 g by  mouth 4 (four) times daily. Chew before swallowing 12/17/13   Janith Lima, MD  tiZANidine (ZANAFLEX) 4 MG tablet Take 2 mg by mouth 3 (three) times daily.    [provider]  verapamil (CALAN-SR) 240 MG CR tablet Take 240 mg by mouth at bedtime. 02/09/17   [provider]    Allergies    Diphenhydramine hcl, Flexeril [cyclobenzaprine], Hctz [hydrochlorothiazide], Nsaids, Gabapentin, and Hydroxyzine  Review of Systems   Review of Systems  Constitutional: Positive for appetite change. Negative for unexpected weight change.  HENT: Negative for congestion.   Respiratory: Negative for shortness of breath.   Cardiovascular: Negative for chest pain and leg swelling.  Gastrointestinal: Negative for abdominal pain.  Genitourinary: Negative for flank pain.  Musculoskeletal: Positive for back pain and neck pain.  Skin: Negative for rash.  Neurological: Negative for weakness and numbness.    Physical Exam Updated Vital Signs BP (!) 151/78   Pulse 81   Temp 98.2 F (36.8 C) (Oral)   Resp 18   SpO2 99%   Physical Exam Vitals and nursing note reviewed.  HENT:     Head: Atraumatic.     Mouth/Throat:     Mouth: Mucous membranes are moist.  Eyes:     Pupils: Pupils are equal, round, and reactive to light.  Cardiovascular:     Rate and Rhythm: Regular  rhythm.  Pulmonary:     Breath sounds: No wheezing or rhonchi.  Abdominal:     Tenderness: There is no abdominal tenderness.  Musculoskeletal:     Cervical back: Neck supple.     Comments: Tenderness over lumbar and cervical spine.  No deformity.  Skin:    General: Skin is warm.     Capillary Refill: Capillary refill takes less than 2 seconds.  Neurological:     Mental Status: He is alert and oriented to person, place, and time.     ED Results / Procedures / Treatments   Labs (all labs ordered are listed, but only abnormal results are displayed) Labs Reviewed  I-STAT CHEM 8, ED - Abnormal; Notable for the following components:      Result Value   Sodium 133 (*)    BUN 7 (*)    Glucose, Bld 112 (*)    Calcium, Ion 1.07 (*)    All other components within normal limits    EKG None  Radiology DG Cervical Spine Complete  Result Date: 11/01/2020 CLINICAL DATA:  Severe chronic neck pain EXAM: CERVICAL SPINE - COMPLETE 4+ VIEW COMPARISON:  None. FINDINGS: Evaluation is limited secondary to positioning. Osteopenia. The cervical spine is visualized from C1-C7. There is exaggerated cervical lordosis, possibly secondary to thoracic kyphotic positioning. No spondylolisthesis. Vertebral body heights are maintained: no evidence of acute fracture. Intervertebral disc spaces are relatively preserved with mild intervertebral disc space height loss at C4-5. There are flowing anterior osteophytes as well as posterior osteophytes. No prevertebral soft tissue swelling. Visualized thorax is unremarkable. IMPRESSION: Limited evaluation secondary to positioning. Relative preservation of the disc space with flowing anterior and posterior osteophytes. This is consistent with reported history of ankylosing spondylitis. As such, if there is concern for acute injury, recommend dedicated cross-sectional imaging. Electronically Signed   By: Valentino Saxon MD   On: 11/01/2020 11:15    Procedures Procedures    Medications Ordered in ED Medications  oxyCODONE-acetaminophen (PERCOCET/ROXICET) 5-325 MG per tablet 1 tablet (1 tablet Oral Given 11/01/20 1022)  LORazepam (ATIVAN) tablet 1 mg (1 mg Oral  Given 11/01/20 1023)  oxyCODONE-acetaminophen (PERCOCET/ROXICET) 5-325 MG per tablet 1 tablet (1 tablet Oral Given 11/01/20 1226)    ED Course  I have reviewed the triage vital signs and the nursing notes.  Pertinent labs & imaging results that were available during my care of the patient were reviewed by me and considered in my medical decision making (see chart for details).    MDM Rules/Calculators/A&P                          Patient with acute on chronic neck pain.  No relief with Tylenol at home.  Apparent history of ankylosing spondylitis.  However I do not see that he has had more of a work-up besides imaging that does show changes consistent with it.  Patient states he been referred to neurosurgery previously but they would not see him unless he had a "work-up".  X-ray done and showed likely changes of ankylosing spondylitis.  No acute injury so I do not think we need more imaging at this time.  No numbness or weakness.  Will treat with some steroids to see if that helps.  Some pain medicine and anxiety medicine given here.  Patient later states that he is been having some sinus pressure.  States it goes to the left side of his head.  No numbness or weakness.  Has had that for a couple weeks now.  I do not think any further imaging but the steroids potentially would help with that too.  Discharge home with outpatient follow-up as needed Had had a hyponatremia in the past.  Only mild hyponatremia at this time.  Discharge home. Final Clinical Impression(s) / ED Diagnoses Final diagnoses:  Neck pain    Rx / DC Orders ED Discharge Orders         Ordered    predniSONE (DELTASONE) 20 MG tablet  Daily        11/01/20 1210           Davonna Belling, MD 11/01/20 1241

## 2020-11-01 NOTE — ED Triage Notes (Signed)
BIBA Per EMS:  Pt coming from home with complaints of severe chronic neck pain.  142/70 98HR  16RR 98% RA

## 2020-11-23 ENCOUNTER — Other Ambulatory Visit: Payer: Self-pay

## 2020-11-23 ENCOUNTER — Encounter (HOSPITAL_BASED_OUTPATIENT_CLINIC_OR_DEPARTMENT_OTHER): Payer: Self-pay

## 2020-11-23 ENCOUNTER — Emergency Department (HOSPITAL_BASED_OUTPATIENT_CLINIC_OR_DEPARTMENT_OTHER)
Admission: EM | Admit: 2020-11-23 | Discharge: 2020-11-23 | Disposition: A | Discharge status: Home / Self Care | Payer: Medicaid Other | Attending: Emergency Medicine | Admitting: Emergency Medicine

## 2020-11-23 DIAGNOSIS — R110 Nausea: Secondary | ICD-10-CM | POA: Insufficient documentation

## 2020-11-23 DIAGNOSIS — Z87891 Personal history of nicotine dependence: Secondary | ICD-10-CM | POA: Insufficient documentation

## 2020-11-23 DIAGNOSIS — F0390 Unspecified dementia without behavioral disturbance: Secondary | ICD-10-CM | POA: Diagnosis not present

## 2020-11-23 DIAGNOSIS — R42 Dizziness and giddiness: Secondary | ICD-10-CM | POA: Insufficient documentation

## 2020-11-23 DIAGNOSIS — I10 Essential (primary) hypertension: Secondary | ICD-10-CM | POA: Diagnosis not present

## 2020-11-23 DIAGNOSIS — R519 Headache, unspecified: Secondary | ICD-10-CM | POA: Insufficient documentation

## 2020-11-23 DIAGNOSIS — M542 Cervicalgia: Secondary | ICD-10-CM | POA: Diagnosis not present

## 2020-11-23 DIAGNOSIS — G8929 Other chronic pain: Secondary | ICD-10-CM

## 2020-11-23 DIAGNOSIS — Z79899 Other long term (current) drug therapy: Secondary | ICD-10-CM | POA: Insufficient documentation

## 2020-11-23 DIAGNOSIS — G44219 Episodic tension-type headache, not intractable: Secondary | ICD-10-CM

## 2020-11-23 LAB — CBC WITH DIFFERENTIAL/PLATELET
Abs Immature Granulocytes: 0.01 10*3/uL (ref 0.00–0.07)
Basophils Absolute: 0.1 10*3/uL (ref 0.0–0.1)
Basophils Relative: 1 %
Eosinophils Absolute: 0.1 10*3/uL (ref 0.0–0.5)
Eosinophils Relative: 2 %
HCT: 42.3 % (ref 39.0–52.0)
Hemoglobin: 14.4 g/dL (ref 13.0–17.0)
Immature Granulocytes: 0 %
Lymphocytes Relative: 31 %
Lymphs Abs: 2.2 10*3/uL (ref 0.7–4.0)
MCH: 31.5 pg (ref 26.0–34.0)
MCHC: 34 g/dL (ref 30.0–36.0)
MCV: 92.6 fL (ref 80.0–100.0)
Monocytes Absolute: 0.5 10*3/uL (ref 0.1–1.0)
Monocytes Relative: 7 %
Neutro Abs: 4.2 10*3/uL (ref 1.7–7.7)
Neutrophils Relative %: 59 %
Platelets: 271 10*3/uL (ref 150–400)
RBC: 4.57 MIL/uL (ref 4.22–5.81)
RDW: 12.5 % (ref 11.5–15.5)
WBC: 7.1 10*3/uL (ref 4.0–10.5)
nRBC: 0 % (ref 0.0–0.2)

## 2020-11-23 LAB — URINALYSIS, ROUTINE W REFLEX MICROSCOPIC
Bilirubin Urine: NEGATIVE
Glucose, UA: NEGATIVE mg/dL
Ketones, ur: NEGATIVE mg/dL
Leukocytes,Ua: NEGATIVE
Nitrite: NEGATIVE
Protein, ur: NEGATIVE mg/dL
Specific Gravity, Urine: 1.007 (ref 1.005–1.030)
pH: 6.5 (ref 5.0–8.0)

## 2020-11-23 LAB — COMPREHENSIVE METABOLIC PANEL
ALT: 11 U/L (ref 0–44)
AST: 17 U/L (ref 15–41)
Albumin: 4.6 g/dL (ref 3.5–5.0)
Alkaline Phosphatase: 60 U/L (ref 38–126)
Anion gap: 10 (ref 5–15)
BUN: 5 mg/dL — ABNORMAL LOW (ref 8–23)
CO2: 28 mmol/L (ref 22–32)
Calcium: 9.5 mg/dL (ref 8.9–10.3)
Chloride: 96 mmol/L — ABNORMAL LOW (ref 98–111)
Creatinine, Ser: 0.71 mg/dL (ref 0.61–1.24)
GFR, Estimated: >60 mL/min (ref >60)
Glucose, Bld: 97 mg/dL (ref 70–99)
Potassium: 3.6 mmol/L (ref 3.5–5.1)
Sodium: 134 mmol/L — ABNORMAL LOW (ref 135–145)
Total Bilirubin: 0.6 mg/dL (ref 0.3–1.2)
Total Protein: 7.2 g/dL (ref 6.5–8.1)

## 2020-11-23 LAB — RAPID URINE DRUG SCREEN, HOSP PERFORMED
Amphetamines: NOT DETECTED
Barbiturates: NOT DETECTED
Benzodiazepines: NOT DETECTED
Cocaine: NOT DETECTED
Opiates: NOT DETECTED
Tetrahydrocannabinol: POSITIVE — AB

## 2020-11-23 LAB — ETHANOL: Alcohol, Ethyl (B): <10 mg/dL (ref <10)

## 2020-11-23 LAB — CBG MONITORING, ED: Glucose-Capillary: 89 mg/dL (ref 70–99)

## 2020-11-23 MED ORDER — KETOROLAC TROMETHAMINE 15 MG/ML IJ SOLN
15.0000 mg | Freq: Once | INTRAMUSCULAR | Status: AC
  Administered 2020-11-23: 15 mg via INTRAVENOUS
  Filled 2020-11-23: qty 1

## 2020-11-23 MED ORDER — SODIUM CHLORIDE 0.9 % IV BOLUS (SEPSIS)
500.0000 mL | Freq: Once | INTRAVENOUS | Status: AC
  Administered 2020-11-23: 500 mL via INTRAVENOUS

## 2020-11-23 MED ORDER — ACETAMINOPHEN 500 MG PO TABS
1000.0000 mg | ORAL_TABLET | Freq: Once | ORAL | Status: AC
  Administered 2020-11-23: 1000 mg via ORAL
  Filled 2020-11-23: qty 2

## 2020-11-23 MED ORDER — OXYCODONE HCL 5 MG PO TABS
5.0000 mg | ORAL_TABLET | Freq: Once | ORAL | Status: AC
Start: 2020-11-23 — End: 2020-11-23
  Administered 2020-11-23: 5 mg via ORAL
  Filled 2020-11-23: qty 1

## 2020-11-23 MED ORDER — ONDANSETRON HCL 4 MG/2ML IJ SOLN
4.0000 mg | Freq: Once | INTRAMUSCULAR | Status: AC
  Administered 2020-11-23: 4 mg via INTRAVENOUS
  Filled 2020-11-23: qty 2

## 2020-11-23 MED ORDER — LORAZEPAM 1 MG PO TABS
1.0000 mg | ORAL_TABLET | Freq: Once | ORAL | Status: AC
  Administered 2020-11-23: 1 mg via ORAL
  Filled 2020-11-23: qty 1

## 2020-11-23 MED ORDER — FENTANYL CITRATE (PF) 100 MCG/2ML IJ SOLN
50.0000 ug | Freq: Once | INTRAMUSCULAR | Status: AC
  Administered 2020-11-23: 50 ug via INTRAVENOUS
  Filled 2020-11-23: qty 2

## 2020-11-23 NOTE — ED Triage Notes (Addendum)
Patient BIB [facility] from Home with Nausea and Neck Pain.  Patient states he was at [facility] a few months ago and was given IVF for dehydration and was discharged. Patient states this feels similar.  Patient also complaining of Chronic Neck Pain.   Brought in via [facility], A&Ox4, GCS 15.

## 2020-11-23 NOTE — ED Provider Notes (Signed)
I received the patient in signout from Dr. Christy Gentles, briefly the patient is a 65 year old male with a significant past medical history of ankylosing spondylitis who comes in with a chief complaints of neck pain and headache.  Sounds typical to his prior.  Plan for laboratory evaluation and if able to eat and drink and feeling somewhat better to discharge home with outpatient follow-up.  Lab work is resulted with a insignificant hyponatremia.  EtOH is negative.  UA without signs of infection.  Patient feeling mildly better.  Tolerating by mouth without issue.  We will have him follow-up with his family doctor.  As the patient is now had multiple visits to the ED for neck pain and headache will give a referral to neurology.   Deno Etienne, DO 11/23/20 [phone]

## 2020-11-23 NOTE — ED Notes (Signed)
Pt discharged to home.  Discharge instructions have been discussed with pt and/or family members.  Pt verbally acknowledges understanding of discharge instructions and endorses comprehension to check out at registration prior to leaving. 

## 2020-11-23 NOTE — Discharge Instructions (Addendum)
Follow up with your family doctor.  I have referred you to neurology with your recurrent symptoms. They should give you a call to set up an appointment.   As this is a recurrent issue, your family doctor should be controlling your pain management for this.

## 2020-11-23 NOTE — ED Provider Notes (Signed)
[facility] EMERGENCY DEPT Provider Note   CSN: [id number] Arrival date & time: 11/23/20  2542     History Chief Complaint  Patient presents with   Nausea   Neck Pain    Anthony Lamb is a 65 y.o. male.  The history is provided by the patient.  Dizziness Quality:  Room spinning Severity:  Mild Onset quality:  Gradual Duration:  1 day Timing:  Intermittent Progression:  Unchanged Chronicity:  New Relieved by:  Being still Worsened by:  Movement Associated symptoms: headaches and nausea   Associated symptoms: no chest pain, no syncope and no vomiting      Patient with history of chronic headaches, chronic neck pain, ankylosing spondylitis, alcohol abuse presents with multiple complaints.  Patient reports he struggled with neck pain frequently and has been getting worse over the past several days.  He also reports associated mild headache.  He reports he has also been having dizziness over the past day.  No focal weakness.  He reports nausea without vomiting.  No visual changes. No Falls or injuries. Patient admits to drinking alcohol frequently and marijuana use Past Medical History:  Diagnosis Date   Alcohol abuse, in remission    Anal fissure    Anemia    Ankylosing spondylitis ([facility])    Anxiety    Arthritis    Chronic diarrhea    Chronic headaches    Chronic pain    Colitis    Colon polyps    Depression    Esophagitis    Gastric polyps    hyperplastic and fundic gland   Gastritis    GERD (gastroesophageal reflux disease)    Hypertension    IBS (irritable bowel syndrome)    Poor dentition    SIADH (syndrome of inappropriate ADH production) ([place])     Patient Active Problem List   Diagnosis Date Noted   Family history of colon cancer 12/02/2018   Gastric polyps    Schatzki's ring    Left sided ulcerative colitis without complication ([place])    Odynophagia 11/25/2018   Dysphagia 11/25/2018   Major depressive disorder, recurrent severe  without psychotic features ([place]) 06/24/2018   IBS (irritable bowel syndrome)    Hypertension    Colitis    Adjustment disorder with mixed anxiety and depressed mood 05/11/2017   Alcohol abuse    Anxiety    Gastroesophageal reflux disease    Osteopenia determined by x-ray 08/06/2014   Stress fracture of calcaneus 08/06/2014   Vitamin D deficiency 12/18/2013   Dementia ([place]) 12/18/2013   Benign microscopic hematuria 07/06/2013   SIADH (syndrome of inappropriate ADH production) ([place]) 06/22/2013   Ankylosing spondylitis ([place]) 06/20/2013   Malnutrition of moderate degree ([place]) 06/20/2013   BPH (benign prostatic hyperplasia) 08/22/2010   History of colonic polyps 08/13/2008   Essential hypertension 07/03/2007   PUD (peptic ulcer disease) 07/03/2007    Past Surgical History:  Procedure Laterality Date   BIOPSY  11/26/2018   Procedure: BIOPSY;  Surgeon: Gatha Mayer, MD;  [facility];  Service: Endoscopy;;   COLONOSCOPY W/ BIOPSIES     COLONOSCOPY WITH PROPOFOL N/A 11/26/2018   Procedure: COLONOSCOPY WITH PROPOFOL;  Surgeon: Gatha Mayer, MD;  [facility];  Service: Endoscopy;  Laterality: N/A;   ESOPHAGOGASTRODUODENOSCOPY     ESOPHAGOGASTRODUODENOSCOPY (EGD) WITH PROPOFOL N/A 11/26/2018   Procedure: ESOPHAGOGASTRODUODENOSCOPY (EGD) WITH PROPOFOL;  Surgeon: Gatha Mayer, MD;  [facility];  Service: Endoscopy;  Laterality: N/A;   HEMOSTASIS  CLIP PLACEMENT  11/26/2018   Procedure: HEMOSTASIS CLIP PLACEMENT;  Surgeon: Gatha Mayer, MD;  [facility];  Service: Endoscopy;;   HERNIA REPAIR Bilateral    HOT HEMOSTASIS N/A 11/26/2018   Procedure: HOT HEMOSTASIS (ARGON PLASMA COAGULATION/BICAP);  Surgeon: Gatha Mayer, MD;  Location: [facility];  Service: Endoscopy;  Laterality: N/A;   OPEN REDUCTION INTERNAL FIXATION (ORIF) DISTAL RADIAL FRACTURE Right 02/11/2018   Procedure: OPEN REDUCTION INTERNAL FIXATION (ORIF) DISTAL RADIAL FRACTURE;   Surgeon: Milly Jakob, MD;  Location: [facility];  Service: Orthopedics;  Laterality: Right;   POLYPECTOMY  11/26/2018   Procedure: POLYPECTOMY;  Surgeon: Gatha Mayer, MD;  [facility];  Service: Endoscopy;;   TONSILLECTOMY         Family History  Problem Relation Age of Onset   Anxiety disorder Mother    Congestive Heart Failure Mother    Crohn's disease Mother    Colon cancer Mother 25   Arthritis Father    High blood pressure Father    Crohn's disease Father     Social History   Tobacco Use   Smoking status: Former    Pack years: 0.00    Types: Cigarettes    Quit date: 2014    Years since quitting: 8.4   Smokeless tobacco: Never  Vaping Use   Vaping Use: Never used  Substance Use Topics   Alcohol use: Yes    Comment: 1 25 oz of beer a night   Drug use: Yes    Types: Marijuana    Home Medications Prior to Admission medications   Medication Sig Start Date End Date Taking? Authorizing Provider  acetaminophen (TYLENOL) 325 MG tablet Take 2 tablets (650 mg total) by mouth every 6 (six) hours as needed for mild pain. (May buy over the counter) 06/27/18   Connye Burkitt, NP  brexpiprazole (REXULTI) 2 MG TABS tablet Take 2 mg by mouth daily.     [provider]  clonazePAM (KLONOPIN) 1 MG tablet Take 1 mg by mouth 3 (three) times daily.    [provider]  DULoxetine (CYMBALTA) 30 MG capsule Take 60 mg by mouth daily.     [provider]  Eszopiclone 3 MG TABS Take 3 mg by mouth at bedtime. 08/24/20   [provider]  lisinopril (PRINIVIL,ZESTRIL) 10 MG tablet Take 1 tablet (10 mg total) by mouth daily. Patient taking differently: Take 10 mg by mouth at bedtime. 08/21/17 05/02/20  Melanee Spry, MD  Multiple Vitamin (MULTIVITAMIN WITH MINERALS) TABS tablet Take 1 tablet by mouth daily. (May buy over the counter) 06/28/18   Connye Burkitt, NP  pantoprazole (PROTONIX) 40 MG tablet Take 1 tablet (40 mg total) by mouth daily.  04/24/14   Janith Lima, MD  propranolol (INDERAL) 10 MG tablet Take 10 mg by mouth 2 (two) times daily. 09/03/20   [provider]  QUEtiapine (SEROQUEL) 300 MG tablet Take 300 mg by mouth at bedtime.     [provider]  sodium chloride (OCEAN) 0.65 % SOLN nasal spray Place 2 sprays into both nostrils 4 (four) times daily as needed for congestion.     [provider]  sucralfate (CARAFATE) 1 G tablet Take 1 tablet (1 g total) by mouth 4 (four) times daily -  with meals and at bedtime. Chew before swallowing Patient taking differently: Take 1 g by mouth 4 (four) times daily. Chew before swallowing 12/17/13   Janith Lima, MD  tiZANidine (ZANAFLEX) 4 MG tablet Take 2 mg by mouth 3 (three) times daily.    [provider]  verapamil (CALAN-SR) 240 MG CR tablet Take 240 mg by mouth at bedtime. 02/09/17   [provider]    Allergies    Diphenhydramine hcl, Flexeril [cyclobenzaprine], Hctz [hydrochlorothiazide], Nsaids, Gabapentin, and Hydroxyzine  Review of Systems   Review of Systems  Cardiovascular:  Negative for chest pain and syncope.  Gastrointestinal:  Positive for nausea. Negative for vomiting.  Musculoskeletal:  Positive for neck pain.  Neurological:  Positive for dizziness and headaches.  All other systems reviewed and are negative.  Physical Exam Updated Vital Signs BP (!) 153/71 (BP Location: Right Arm)   Pulse 63   Temp 97.6 F (36.4 C) (Oral)   Resp 16   Ht 1.778 m (5' 10" )   Wt 72.6 kg   SpO2 98%   BMI 22.96 kg/m   Physical Exam CONSTITUTIONAL: Elderly, disheveled HEAD: Normocephalic/atraumatic EYES: EOMI/PERRL, no nystagmus, no ptosis ENMT: Mucous membranes moist NECK: supple no meningeal signs, no bruits Cervical paraspinal tenderness CV: S1/S2 noted, no murmurs/rubs/gallops noted LUNGS: Lungs are clear to auscultation bilaterally, no apparent distress ABDOMEN: soft, nontender, no rebound or guarding GU:no cva  tenderness NEURO:Awake/alert, face symmetric, no arm or leg drift is noted Equal 5/5 strength with shoulder abduction, elbow flex/extension, wrist flex/extension in upper extremities and equal hand grips bilaterally Equal 5/5 strength with hip flexion,knee flex/extension, foot dorsi/plantar flexion Cranial nerves 3/4/5/6/12/11/08/11/12 tested and intact Slow steady gait noted No past pointing Sensation to light touch intact in all extremities EXTREMITIES: pulses normal, full ROM SKIN: warm, color normal PSYCH: no abnormalities of mood noted  ED Results / Procedures / Treatments   Labs (all labs ordered are listed, but only abnormal results are displayed) Labs Reviewed  CBC WITH DIFFERENTIAL/PLATELET  COMPREHENSIVE METABOLIC PANEL  RAPID URINE DRUG SCREEN, HOSP PERFORMED  ETHANOL  URINALYSIS, ROUTINE W REFLEX MICROSCOPIC    EKG EKG Interpretation  Date/Time:  Tuesday November 23 2020 06:41:37 EDT Ventricular Rate:  69 PR Interval:  185 QRS Duration: 120 QT Interval:  461 QTC Calculation: 494 R Axis:   59 Text Interpretation: Sinus rhythm Nonspecific intraventricular conduction delay Borderline T abnormalities, inferior leads Confirmed by Ripley Fraise [phone]) on 11/23/2020 6:45:01 AM  Radiology No results found.  Procedures Procedures   Medications Ordered in ED Medications  sodium chloride 0.9 % bolus 500 mL (500 mLs Intravenous New Bag/Given 11/23/20 0644)  fentaNYL (SUBLIMAZE) injection 50 mcg (50 mcg Intravenous Given 11/23/20 0643)  ondansetron (ZOFRAN) injection 4 mg (4 mg Intravenous Given 11/23/20 3419)    ED Course  I have reviewed the triage vital signs and the nursing notes.  Pertinent labs & imaging results that were available during my care of the patient were reviewed by me and considered in my medical decision making (see chart for details).    MDM Rules/Calculators/A&P                          6:57 AM Patient with history of chronic neck pain, chronic  headaches and frequent bouts of hyponatremia presents with nausea, dizziness and neck pain.  Patient has no focal neurodeficits.  Patient reports he feels thirsty and dehydrated.  Will Provide IV fluids, IV pain medicine and nausea medicine, and reassess.  Will defer any imaging for now. 6:57 AM Signed out to Dr. Tyrone Nine at shift change Final Clinical Impression(s) / ED Diagnoses Final diagnoses:  None    Rx / DC Orders ED Discharge Orders     None        Ripley Fraise, MD 11/23/20 [phone]

## 2020-11-24 ENCOUNTER — Other Ambulatory Visit: Payer: Self-pay

## 2020-11-24 ENCOUNTER — Emergency Department (HOSPITAL_BASED_OUTPATIENT_CLINIC_OR_DEPARTMENT_OTHER): Payer: Medicaid Other

## 2020-11-24 ENCOUNTER — Encounter (HOSPITAL_BASED_OUTPATIENT_CLINIC_OR_DEPARTMENT_OTHER): Payer: Self-pay | Admitting: *Deleted

## 2020-11-24 ENCOUNTER — Emergency Department (HOSPITAL_BASED_OUTPATIENT_CLINIC_OR_DEPARTMENT_OTHER)
Admission: EM | Admit: 2020-11-24 | Discharge: 2020-11-24 | Disposition: A | Discharge status: Home / Self Care | Payer: Medicaid Other | Attending: Emergency Medicine | Admitting: Emergency Medicine

## 2020-11-24 ENCOUNTER — Other Ambulatory Visit (HOSPITAL_BASED_OUTPATIENT_CLINIC_OR_DEPARTMENT_OTHER): Payer: Self-pay

## 2020-11-24 DIAGNOSIS — E860 Dehydration: Secondary | ICD-10-CM | POA: Diagnosis not present

## 2020-11-24 DIAGNOSIS — R519 Headache, unspecified: Secondary | ICD-10-CM | POA: Diagnosis present

## 2020-11-24 DIAGNOSIS — Z87891 Personal history of nicotine dependence: Secondary | ICD-10-CM | POA: Insufficient documentation

## 2020-11-24 DIAGNOSIS — I10 Essential (primary) hypertension: Secondary | ICD-10-CM | POA: Insufficient documentation

## 2020-11-24 DIAGNOSIS — R112 Nausea with vomiting, unspecified: Secondary | ICD-10-CM | POA: Insufficient documentation

## 2020-11-24 DIAGNOSIS — J0110 Acute frontal sinusitis, unspecified: Secondary | ICD-10-CM | POA: Insufficient documentation

## 2020-11-24 DIAGNOSIS — M542 Cervicalgia: Secondary | ICD-10-CM | POA: Insufficient documentation

## 2020-11-24 DIAGNOSIS — Z79899 Other long term (current) drug therapy: Secondary | ICD-10-CM | POA: Diagnosis not present

## 2020-11-24 DIAGNOSIS — Z20822 Contact with and (suspected) exposure to covid-19: Secondary | ICD-10-CM | POA: Insufficient documentation

## 2020-11-24 DIAGNOSIS — F0390 Unspecified dementia without behavioral disturbance: Secondary | ICD-10-CM | POA: Insufficient documentation

## 2020-11-24 LAB — CBC WITH DIFFERENTIAL/PLATELET
Abs Immature Granulocytes: 0.02 10*3/uL (ref 0.00–0.07)
Basophils Absolute: 0.1 10*3/uL (ref 0.0–0.1)
Basophils Relative: 1 %
Eosinophils Absolute: 0 10*3/uL (ref 0.0–0.5)
Eosinophils Relative: 1 %
HCT: 39.8 % (ref 39.0–52.0)
Hemoglobin: 13.6 g/dL (ref 13.0–17.0)
Immature Granulocytes: 0 %
Lymphocytes Relative: 13 %
Lymphs Abs: 1 10*3/uL (ref 0.7–4.0)
MCH: 31.6 pg (ref 26.0–34.0)
MCHC: 34.2 g/dL (ref 30.0–36.0)
MCV: 92.3 fL (ref 80.0–100.0)
Monocytes Absolute: 0.6 10*3/uL (ref 0.1–1.0)
Monocytes Relative: 7 %
Neutro Abs: 6.2 10*3/uL (ref 1.7–7.7)
Neutrophils Relative %: 78 %
Platelets: 282 10*3/uL (ref 150–400)
RBC: 4.31 MIL/uL (ref 4.22–5.81)
RDW: 12.5 % (ref 11.5–15.5)
WBC: 7.9 10*3/uL (ref 4.0–10.5)
nRBC: 0 % (ref 0.0–0.2)

## 2020-11-24 LAB — COMPREHENSIVE METABOLIC PANEL
ALT: 11 U/L (ref 0–44)
AST: 18 U/L (ref 15–41)
Albumin: 4.5 g/dL (ref 3.5–5.0)
Alkaline Phosphatase: 70 U/L (ref 38–126)
Anion gap: 10 (ref 5–15)
BUN: 5 mg/dL — ABNORMAL LOW (ref 8–23)
CO2: 28 mmol/L (ref 22–32)
Calcium: 9.4 mg/dL (ref 8.9–10.3)
Chloride: 96 mmol/L — ABNORMAL LOW (ref 98–111)
Creatinine, Ser: 0.7 mg/dL (ref 0.61–1.24)
GFR, Estimated: >60 mL/min (ref >60)
Glucose, Bld: 105 mg/dL — ABNORMAL HIGH (ref 70–99)
Potassium: 3.9 mmol/L (ref 3.5–5.1)
Sodium: 134 mmol/L — ABNORMAL LOW (ref 135–145)
Total Bilirubin: 0.5 mg/dL (ref 0.3–1.2)
Total Protein: 6.9 g/dL (ref 6.5–8.1)

## 2020-11-24 LAB — URINALYSIS, ROUTINE W REFLEX MICROSCOPIC
Bilirubin Urine: NEGATIVE
Glucose, UA: NEGATIVE mg/dL
Hgb urine dipstick: NEGATIVE
Leukocytes,Ua: NEGATIVE
Nitrite: NEGATIVE
Protein, ur: NEGATIVE mg/dL
Specific Gravity, Urine: 1.009 (ref 1.005–1.030)
pH: 7.5 (ref 5.0–8.0)

## 2020-11-24 LAB — PROTIME-INR
INR: 1 (ref 0.8–1.2)
Prothrombin Time: 12.9 seconds (ref 11.4–15.2)

## 2020-11-24 LAB — RESP PANEL BY RT-PCR (FLU A&B, COVID) ARPGX2
Influenza A by PCR: NEGATIVE
Influenza B by PCR: NEGATIVE
SARS Coronavirus 2 by RT PCR: NEGATIVE

## 2020-11-24 MED ORDER — ONDANSETRON HCL 4 MG/2ML IJ SOLN
4.0000 mg | Freq: Once | INTRAMUSCULAR | Status: AC
  Administered 2020-11-24: 4 mg via INTRAVENOUS
  Filled 2020-11-24: qty 2

## 2020-11-24 MED ORDER — LORAZEPAM 1 MG PO TABS
2.0000 mg | ORAL_TABLET | Freq: Once | ORAL | Status: AC
  Administered 2020-11-24: 2 mg via ORAL
  Filled 2020-11-24: qty 2

## 2020-11-24 MED ORDER — SODIUM CHLORIDE 0.9 % IV BOLUS
1000.0000 mL | Freq: Once | INTRAVENOUS | Status: AC
  Administered 2020-11-24: 1000 mL via INTRAVENOUS

## 2020-11-24 MED ORDER — FENTANYL CITRATE (PF) 100 MCG/2ML IJ SOLN
50.0000 ug | Freq: Once | INTRAMUSCULAR | Status: AC
  Administered 2020-11-24: 50 ug via INTRAVENOUS
  Filled 2020-11-24: qty 2

## 2020-11-24 MED ORDER — AMOXICILLIN-POT CLAVULANATE 875-125 MG PO TABS: 1.0000 | ORAL_TABLET | Freq: Two times a day (BID) | ORAL | 0 refills | Status: AC

## 2020-11-24 MED ORDER — ONDANSETRON 4 MG PO TBDP: 4.0000 mg | ORAL_TABLET | Freq: Three times a day (TID) | ORAL | 0 refills | Status: DC | PRN

## 2020-11-24 MED ORDER — FENTANYL CITRATE (PF) 100 MCG/2ML IJ SOLN
50.0000 ug | Freq: Once | INTRAMUSCULAR | Status: AC
Start: 2020-11-24 — End: 2020-11-24
  Administered 2020-11-24: 50 ug via INTRAVENOUS
  Filled 2020-11-24: qty 2

## 2020-11-24 MED ORDER — CLONAZEPAM 1 MG PO TABS: 1.0000 mg | ORAL_TABLET | Freq: Once | ORAL | Status: DC

## 2020-11-24 NOTE — ED Provider Notes (Signed)
[facility] EMERGENCY DEPT Provider Note   CSN: [id number] Arrival date & time: 11/24/20  0768     History Chief Complaint  Patient presents with   Dizziness   Emesis   Headache    Anthony Lamb is a 65 y.o. male.  The history is provided by the patient and medical records. No language interpreter was used.  Dizziness Quality:  Lightheadedness and head spinning Severity:  Severe Onset quality:  Gradual Duration:  1 day Timing:  Constant Progression:  Unchanged Chronicity:  Recurrent Context: standing up   Relieved by:  Nothing Worsened by:  Sitting upright and standing up Ineffective treatments:  None tried Associated symptoms: headaches, nausea and vomiting   Associated symptoms: no chest pain, no diarrhea, no palpitations, no shortness of breath, no syncope, no vision changes and no weakness   Emesis Severity:  Severe Duration:  1 day Quality:  Stomach contents Progression:  Unchanged Chronicity:  Recurrent Associated symptoms: headaches   Associated symptoms: no abdominal pain, no chills, no cough, no diarrhea and no fever   Headache Associated symptoms: dizziness, fatigue, nausea, neck pain and vomiting   Associated symptoms: no abdominal pain, no back pain, no congestion, no cough, no diarrhea, no fever, no numbness, no seizures, no syncope, no visual change and no weakness       Past Medical History:  Diagnosis Date   Alcohol abuse, in remission    Anal fissure    Anemia    Ankylosing spondylitis (HCC)    Anxiety    Arthritis    Chronic diarrhea    Chronic headaches    Chronic pain    Colitis    Colon polyps    Depression    Esophagitis    Gastric polyps    hyperplastic and fundic gland   Gastritis    GERD (gastroesophageal reflux disease)    Hypertension    IBS (irritable bowel syndrome)    Poor dentition    SIADH (syndrome of inappropriate ADH production) ([place])     Patient Active Problem List   Diagnosis Date Noted    Family history of colon cancer 12/02/2018   Gastric polyps    Schatzki's ring    Left sided ulcerative colitis without complication ([place])    Odynophagia 11/25/2018   Dysphagia 11/25/2018   Major depressive disorder, recurrent severe without psychotic features ([place]) 06/24/2018   IBS (irritable bowel syndrome)    Hypertension    Colitis    Adjustment disorder with mixed anxiety and depressed mood 05/11/2017   Alcohol abuse    Anxiety    Gastroesophageal reflux disease    Osteopenia determined by x-ray 08/06/2014   Stress fracture of calcaneus 08/06/2014   Vitamin D deficiency 12/18/2013   Dementia ([place]) 12/18/2013   Benign microscopic hematuria 07/06/2013   SIADH (syndrome of inappropriate ADH production) ([place]) 06/22/2013   Ankylosing spondylitis ([place]) 06/20/2013   Malnutrition of moderate degree (HCC) 06/20/2013   BPH (benign prostatic hyperplasia) 08/22/2010   History of colonic polyps 08/13/2008   Essential hypertension 07/03/2007   PUD (peptic ulcer disease) 07/03/2007    Past Surgical History:  Procedure Laterality Date   BIOPSY  11/26/2018   Procedure: BIOPSY;  Surgeon: Gatha Mayer, MD;  Location: [facility];  Service: Endoscopy;;   COLONOSCOPY W/ BIOPSIES     COLONOSCOPY WITH PROPOFOL N/A 11/26/2018   Procedure: COLONOSCOPY WITH PROPOFOL;  Surgeon: Gatha Mayer, MD;  Location: [facility];  Service: Endoscopy;  Laterality: N/A;   ESOPHAGOGASTRODUODENOSCOPY  ESOPHAGOGASTRODUODENOSCOPY (EGD) WITH PROPOFOL N/A 11/26/2018   Procedure: ESOPHAGOGASTRODUODENOSCOPY (EGD) WITH PROPOFOL;  Surgeon: Gatha Mayer, MD;  Location: [facility];  Service: Endoscopy;  Laterality: N/A;   HEMOSTASIS CLIP PLACEMENT  11/26/2018   Procedure: HEMOSTASIS CLIP PLACEMENT;  Surgeon: Gatha Mayer, MD;  Location: [facility];  Service: Endoscopy;;   HERNIA REPAIR Bilateral    HOT HEMOSTASIS N/A 11/26/2018   Procedure: HOT HEMOSTASIS (ARGON PLASMA COAGULATION/BICAP);  Surgeon:  Gatha Mayer, MD;  Location: [facility];  Service: Endoscopy;  Laterality: N/A;   OPEN REDUCTION INTERNAL FIXATION (ORIF) DISTAL RADIAL FRACTURE Right 02/11/2018   Procedure: OPEN REDUCTION INTERNAL FIXATION (ORIF) DISTAL RADIAL FRACTURE;  Surgeon: Milly Jakob, MD;  Location: [facility];  Service: Orthopedics;  Laterality: Right;   POLYPECTOMY  11/26/2018   Procedure: POLYPECTOMY;  Surgeon: Gatha Mayer, MD;  Location: [facility];  Service: Endoscopy;;   TONSILLECTOMY         Family History  Problem Relation Age of Onset   Anxiety disorder Mother    Congestive Heart Failure Mother    Crohn's disease Mother    Colon cancer Mother 69   Arthritis Father    High blood pressure Father    Crohn's disease Father     Social History   Tobacco Use   Smoking status: Former    Pack years: 0.00    Types: Cigarettes    Quit date: 2014    Years since quitting: 8.4   Smokeless tobacco: Never  Vaping Use   Vaping Use: Never used  Substance Use Topics   Alcohol use: Yes    Comment: 1 25 oz of beer a night   Drug use: Yes    Types: Marijuana    Comment: other night    Home Medications Prior to Admission medications   Medication Sig Start Date End Date Taking? Authorizing Provider  acetaminophen (TYLENOL) 325 MG tablet Take 2 tablets (650 mg total) by mouth every 6 (six) hours as needed for mild pain. (May buy over the counter) 06/27/18  Yes Connye Burkitt, NP  brexpiprazole (REXULTI) 2 MG TABS tablet Take 2 mg by mouth daily.    Yes [provider]  clonazePAM (KLONOPIN) 1 MG tablet Take 1 mg by mouth 3 (three) times daily.   Yes [provider]  DULoxetine (CYMBALTA) 30 MG capsule Take 60 mg by mouth daily.    Yes [provider]  Eszopiclone 3 MG TABS Take 3 mg by mouth at bedtime. 08/24/20  Yes [provider]  lisinopril (PRINIVIL,ZESTRIL) 10 MG tablet Take 1 tablet (10 mg total) by mouth daily. Patient taking differently: Take 10 mg by mouth  at bedtime. 08/21/17 11/24/20 Yes Lacroce, Hulen Shouts, MD  Multiple Vitamin (MULTIVITAMIN WITH MINERALS) TABS tablet Take 1 tablet by mouth daily. (May buy over the counter) 06/28/18  Yes Connye Burkitt, NP  pantoprazole (PROTONIX) 40 MG tablet Take 1 tablet (40 mg total) by mouth daily. 04/24/14  Yes Janith Lima, MD  QUEtiapine (SEROQUEL) 300 MG tablet Take 300 mg by mouth at bedtime.    Yes [provider]  sucralfate (CARAFATE) 1 G tablet Take 1 tablet (1 g total) by mouth 4 (four) times daily -  with meals and at bedtime. Chew before swallowing Patient taking differently: Take 1 g by mouth 4 (four) times daily. Chew before swallowing 12/17/13  Yes Janith Lima, MD  tiZANidine (ZANAFLEX) 4 MG tablet Take 2 mg by mouth 3 (three) times  daily.   Yes [provider]  verapamil (CALAN-SR) 240 MG CR tablet Take 240 mg by mouth at bedtime. 02/09/17  Yes [provider]  sodium chloride (OCEAN) 0.65 % SOLN nasal spray Place 2 sprays into both nostrils 4 (four) times daily as needed for congestion.     [provider]    Allergies    Diphenhydramine hcl, Flexeril [cyclobenzaprine], Hctz [hydrochlorothiazide], Nsaids, Gabapentin, and Hydroxyzine  Review of Systems   Review of Systems  Constitutional:  Positive for fatigue. Negative for chills, diaphoresis and fever.  HENT:  Negative for congestion.   Eyes:  Negative for visual disturbance.  Respiratory:  Negative for cough, chest tightness, shortness of breath and wheezing.   Cardiovascular:  Negative for chest pain, palpitations, leg swelling and syncope.  Gastrointestinal:  Positive for nausea and vomiting. Negative for abdominal pain, constipation and diarrhea.  Genitourinary:  Negative for frequency.  Musculoskeletal:  Positive for neck pain. Negative for back pain.  Skin:  Negative for rash and wound.  Neurological:  Positive for dizziness and headaches. Negative for seizures, facial asymmetry, speech  difficulty, weakness, light-headedness and numbness.  Psychiatric/Behavioral:  Negative for agitation.   All other systems reviewed and are negative.  Physical Exam Updated Vital Signs BP (!) 154/87 (BP Location: Right Arm)   Pulse 81   Temp 98.1 F (36.7 C) (Oral)   Resp 16   Ht 5' 10"  (1.778 m)   Wt 72.6 kg   SpO2 97%   BMI 22.96 kg/m   Physical Exam Vitals and nursing note reviewed.  Constitutional:      General: He is not in acute distress.    Appearance: He is well-developed. He is not ill-appearing, toxic-appearing or diaphoretic.  HENT:     Head: Normocephalic and atraumatic.     Mouth/Throat:     Mouth: Mucous membranes are dry.     Pharynx: No oropharyngeal exudate or posterior oropharyngeal erythema.  Eyes:     Extraocular Movements: Extraocular movements intact.     Conjunctiva/sclera: Conjunctivae normal.     Pupils: Pupils are equal, round, and reactive to light.  Neck:     Vascular: No carotid bruit.  Cardiovascular:     Rate and Rhythm: Normal rate and regular rhythm.     Pulses: Normal pulses.     Heart sounds: No murmur heard. Pulmonary:     Effort: Pulmonary effort is normal. No respiratory distress.     Breath sounds: Normal breath sounds. No wheezing, rhonchi or rales.  Chest:     Chest wall: No tenderness.  Abdominal:     General: Abdomen is flat.     Palpations: Abdomen is soft.     Tenderness: There is no abdominal tenderness. There is no right CVA tenderness, left CVA tenderness, guarding or rebound.  Musculoskeletal:        General: Tenderness (neck paraspinally) present.     Cervical back: Neck supple. Tenderness present.  Skin:    General: Skin is warm and dry.     Capillary Refill: Capillary refill takes less than 2 seconds.     Coloration: Skin is not pale.     Findings: No erythema or rash.  Neurological:     General: No focal deficit present.     Mental Status: He is alert.     Sensory: No sensory deficit.     Motor: No  weakness.  Psychiatric:        Mood and Affect: Mood normal.  ED Results / Procedures / Treatments   Labs (all labs ordered are listed, but only abnormal results are displayed) Labs Reviewed  COMPREHENSIVE METABOLIC PANEL - Abnormal; Notable for the following components:      Result Value   Sodium 134 (*)    Chloride 96 (*)    Glucose, Bld 105 (*)    BUN 5 (*)    All other components within normal limits  URINALYSIS, ROUTINE W REFLEX MICROSCOPIC - Abnormal; Notable for the following components:   Color, Urine COLORLESS (*)    Ketones, ur TRACE (*)    All other components within normal limits  RESP PANEL BY RT-PCR (FLU A&B, COVID) ARPGX2  URINE CULTURE  CBC WITH DIFFERENTIAL/PLATELET  PROTIME-INR    EKG None  Radiology CT Head Wo Contrast  Result Date: 11/24/2020 CLINICAL DATA:  Nausea/vomiting Headache, new or worsening (Age >= 50y) Headache, nausea, vomiting, dizziness, history of SIADH and ankylosing spondylitis. No change in his chronic neck pain but worsened headache today recurrent. EXAM: CT HEAD WITHOUT CONTRAST TECHNIQUE: Contiguous axial images were obtained from the base of the skull through the vertex without intravenous contrast. COMPARISON:  Head CT 05/02/2020 FINDINGS: Brain: No evidence of acute infarction, hemorrhage, hydrocephalus, extra-axial collection or mass lesion/mass effect. Vascular: No hyperdense vessel or unexpected calcification. Skull: Normal. Negative for fracture or focal lesion. Sinuses/Orbits: There is frothy material in the left sphenoid sinus. Other: None. IMPRESSION: No acute intracranial abnormality. Electronically Signed   By: Maurine Simmering   On: 11/24/2020 12:34   DG Chest Portable 1 View  Result Date: 11/24/2020 CLINICAL DATA:  Cough.  Dizziness, vomiting and headache for 1 day. EXAM: PORTABLE CHEST 1 VIEW COMPARISON:  Prior chest radiograph 11/18/2019. FINDINGS: Heart size within normal limits. Aortic atherosclerosis. No appreciable airspace  consolidation. No evidence of pleural effusion or pneumothorax. No acute bony abnormality identified. IMPRESSION: No evidence of acute cardiopulmonary abnormality. Aortic Atherosclerosis (ICD10-I70.0). Electronically Signed   By: Kellie Simmering DO   On: 11/24/2020 14:11    Procedures Procedures   Medications Ordered in ED Medications  sodium chloride 0.9 % bolus 1,000 mL (0 mLs Intravenous Stopped 11/24/20 1244)  fentaNYL (SUBLIMAZE) injection 50 mcg (50 mcg Intravenous Given 11/24/20 1035)  ondansetron (ZOFRAN) injection 4 mg (4 mg Intravenous Given 11/24/20 1036)  fentaNYL (SUBLIMAZE) injection 50 mcg (50 mcg Intravenous Given 11/24/20 1243)  ondansetron (ZOFRAN) injection 4 mg (4 mg Intravenous Given 11/24/20 1413)  LORazepam (ATIVAN) tablet 2 mg (2 mg Oral Given 11/24/20 1505)    ED Course  I have reviewed the triage vital signs and the nursing notes.  Pertinent labs & imaging results that were available during my care of the patient were reviewed by me and considered in my medical decision making (see chart for details).    MDM Rules/Calculators/A&P                          Anthony Lamb is a 65 y.o. male with a past medical history significant for prior SIADH, GERD, chronic headaches, ankylosing spondylosis with chronic neck pain, hypertension, anxiety, previous alcohol abuse, and dementia who presents with recurrent headache, nausea, vomiting, and lightheadedness/dizziness.  Patient reports that he was seen yesterday for similar and was able to feel better after some fluids, pain medicine, nausea medicine.  He reports that he did well for the rest the day but this morning woke up with severe headache and uncontrolled nausea and vomiting.  He  reports he feels lightheaded and dizzy especially when he sits up.  He feels very dehydrated.  He reports that they did not get any imaging of his head yesterday and he is concerned about that.  He reports no chest pain, abdominal pain, or back pain.   He does report his neck pain is unchanged from his baseline he does not feel is any different.  Denies other neurologic deficits.  He reports he does not have any nausea medicine at home to help with symptoms.  On exam, lungs clear and chest nontender.  Abdomen nontender.  Patient will extremities.  Normal sensation and strength in all extremities.  Normal finger-nose-finger testing in bilateral hands.  Pupils symmetric and reactive, extract movements.  Clear speech.  Symmetric smile.  No evidence of acute trauma.  Neck was tender paraspinally which he reports is at his baseline but no carotid bruit appreciated.  Clinically I do feel we need to get a CT of his head to look for acute abnormalities since he did not have imaging during his last visit.  We will get a CT head we will also give some fluids, pain medicine, nausea medicine again.  We will get some repeat labs as he has history of SIADH and hyponatremia.  We will make sure this does not worsen.  Anticipate reassessment after work-up to determine disposition.  Patient CT scan does show a frothy substance in his sinus likely sinus infection.  Other imaging reassuring.  Labs appear similar to prior.  Patient does appear slightly dehydrated and got some fluids.  Patient given prescription for Augmentin for antibiotics as well as Zofran ODT to help him tolerate his fluids.  Otherwise he is well-appearing and his work-up is reassuring.  We feel he is safe for discharge home as he has passed a p.o. challenge.  He will follow-up with the PCP and understands return precautions.  Patient discharged in good condition.    Final Clinical Impression(s) / ED Diagnoses Final diagnoses:  Acute frontal sinusitis, recurrence not specified  Nonintractable headache, unspecified chronicity pattern, unspecified headache type  Dehydration  Nausea and vomiting, intractability of vomiting not specified, unspecified vomiting type    Rx / DC Orders ED Discharge  Orders          Ordered    amoxicillin-clavulanate (AUGMENTIN) 875-125 MG tablet  2 times daily        11/24/20 1623    ondansetron (ZOFRAN ODT) 4 MG disintegrating tablet  Every 8 hours PRN        11/24/20 1623            Clinical Impression: 1. Acute frontal sinusitis, recurrence not specified   2. Nonintractable headache, unspecified chronicity pattern, unspecified headache type   3. Dehydration   4. Nausea and vomiting, intractability of vomiting not specified, unspecified vomiting type     Disposition: Discharge  Condition: Good  I have discussed the results, Dx and Tx plan with the pt(& family if present). He/she/they expressed understanding and agree(s) with the plan. Discharge instructions discussed at great length. Strict return precautions discussed and pt &/or family have verbalized understanding of the instructions. No further questions at time of discharge.    New Prescriptions   AMOXICILLIN-CLAVULANATE (AUGMENTIN) 875-125 MG TABLET    Take 1 tablet by mouth 2 (two) times daily for 14 days.   ONDANSETRON (ZOFRAN ODT) 4 MG DISINTEGRATING TABLET    Take 1 tablet (4 mg total) by mouth every 8 (eight) hours as needed for  nausea or vomiting.    Follow Up: Sandi Mariscal, MD [place] [address] [phone]     [place] [id number] [phone] Schedule an appointment as soon as possible for a visit    [facility] [place] [phone] [phone]       Asif Muchow, Gwenyth Allegra, MD 11/24/20 [phone]

## 2020-11-24 NOTE — Discharge Instructions (Addendum)
Your work-up today revealed you do have a likely sinus infection contributing to some of your headaches and symptoms.  After fluids, your vital signs remained reassuring and your work-up was otherwise reassuring as well.  No evidence of acute kidney injury or other significant organ dysfunction.  Please use the prescription for antibiotics to treat the sinus infection and nausea medicine to help maintain hydration.  Please rest and do not stand up too quickly.  Please follow-up with a primary doctor for further management.  If any symptoms change or worsen acutely, please return to the nearest emergency department.

## 2020-11-24 NOTE — ED Notes (Signed)
Patient transported to CT 

## 2020-11-24 NOTE — ED Triage Notes (Signed)
Pt stated that he is dizzy and could hardly walk, vomited x 1 and frontal headache since this weekend.

## 2020-11-26 LAB — URINE CULTURE: Culture: NO GROWTH

## 2020-12-18 ENCOUNTER — Encounter (HOSPITAL_BASED_OUTPATIENT_CLINIC_OR_DEPARTMENT_OTHER): Payer: Self-pay

## 2020-12-18 ENCOUNTER — Emergency Department (HOSPITAL_BASED_OUTPATIENT_CLINIC_OR_DEPARTMENT_OTHER)
Admission: EM | Admit: 2020-12-18 | Discharge: 2020-12-18 | Disposition: A | Discharge status: Home / Self Care | Payer: Medicaid Other | Attending: Emergency Medicine | Admitting: Emergency Medicine

## 2020-12-18 DIAGNOSIS — R519 Headache, unspecified: Secondary | ICD-10-CM | POA: Diagnosis present

## 2020-12-18 DIAGNOSIS — I10 Essential (primary) hypertension: Secondary | ICD-10-CM | POA: Diagnosis not present

## 2020-12-18 DIAGNOSIS — M542 Cervicalgia: Secondary | ICD-10-CM | POA: Diagnosis not present

## 2020-12-18 DIAGNOSIS — R1110 Vomiting, unspecified: Secondary | ICD-10-CM | POA: Insufficient documentation

## 2020-12-18 DIAGNOSIS — Z87891 Personal history of nicotine dependence: Secondary | ICD-10-CM | POA: Insufficient documentation

## 2020-12-18 DIAGNOSIS — Z79899 Other long term (current) drug therapy: Secondary | ICD-10-CM | POA: Diagnosis not present

## 2020-12-18 LAB — COMPREHENSIVE METABOLIC PANEL
ALT: 11 U/L (ref 0–44)
AST: 18 U/L (ref 15–41)
Albumin: 4.5 g/dL (ref 3.5–5.0)
Alkaline Phosphatase: 61 U/L (ref 38–126)
Anion gap: 10 (ref 5–15)
BUN: 5 mg/dL — ABNORMAL LOW (ref 8–23)
CO2: 27 mmol/L (ref 22–32)
Calcium: 9.2 mg/dL (ref 8.9–10.3)
Chloride: 94 mmol/L — ABNORMAL LOW (ref 98–111)
Creatinine, Ser: 0.73 mg/dL (ref 0.61–1.24)
GFR, Estimated: >60 mL/min (ref >60)
Glucose, Bld: 117 mg/dL — ABNORMAL HIGH (ref 70–99)
Potassium: 3.9 mmol/L (ref 3.5–5.1)
Sodium: 131 mmol/L — ABNORMAL LOW (ref 135–145)
Total Bilirubin: 0.4 mg/dL (ref 0.3–1.2)
Total Protein: 7.1 g/dL (ref 6.5–8.1)

## 2020-12-18 LAB — CBC
HCT: 38.6 % — ABNORMAL LOW (ref 39.0–52.0)
Hemoglobin: 13.2 g/dL (ref 13.0–17.0)
MCH: 31.4 pg (ref 26.0–34.0)
MCHC: 34.2 g/dL (ref 30.0–36.0)
MCV: 91.9 fL (ref 80.0–100.0)
Platelets: 271 10*3/uL (ref 150–400)
RBC: 4.2 MIL/uL — ABNORMAL LOW (ref 4.22–5.81)
RDW: 12.3 % (ref 11.5–15.5)
WBC: 13.5 10*3/uL — ABNORMAL HIGH (ref 4.0–10.5)
nRBC: 0 % (ref 0.0–0.2)

## 2020-12-18 MED ORDER — LORAZEPAM 2 MG/ML IJ SOLN
0.5000 mg | Freq: Once | INTRAMUSCULAR | Status: AC
  Administered 2020-12-18: 0.5 mg via INTRAVENOUS
  Filled 2020-12-18: qty 1

## 2020-12-18 MED ORDER — METOCLOPRAMIDE HCL 5 MG/ML IJ SOLN
10.0000 mg | Freq: Once | INTRAMUSCULAR | Status: AC
  Administered 2020-12-18: 10 mg via INTRAVENOUS
  Filled 2020-12-18: qty 2

## 2020-12-18 MED ORDER — SODIUM CHLORIDE 0.9 % IV BOLUS
500.0000 mL | Freq: Once | INTRAVENOUS | Status: AC
Start: 2020-12-18 — End: 2020-12-18
  Administered 2020-12-18: 500 mL via INTRAVENOUS

## 2020-12-18 MED ORDER — FENTANYL CITRATE (PF) 100 MCG/2ML IJ SOLN
50.0000 ug | Freq: Once | INTRAMUSCULAR | Status: AC
  Administered 2020-12-18: 50 ug via INTRAVENOUS
  Filled 2020-12-18: qty 2

## 2020-12-18 MED ORDER — HYDROMORPHONE HCL 1 MG/ML IJ SOLN
1.0000 mg | Freq: Once | INTRAMUSCULAR | Status: AC
  Administered 2020-12-18: 1 mg via INTRAVENOUS
  Filled 2020-12-18: qty 1

## 2020-12-18 NOTE — ED Provider Notes (Signed)
[facility] EMERGENCY DEPT Provider Note   CSN: [id number] Arrival date & time: 12/18/20  1739     History Chief Complaint  Patient presents with   Headache   Emesis    Anthony Lamb is a 65 y.o. male with history of ankylosing spondylitis, chronic neck pain, chronic headaches, present emergency department with neck pain and headache.  The patient reports that he has his symptoms on a near daily basis.  Today was worse.  He said he vomited cannot keep down his medications.  EMS arrived and reported patient was poking marijuana on their arrival.  Patient reports a headache in the back of his neck as well as in the front middle of his head.  He was seen in the emergency department approximately 2 to 3 weeks ago with similar symptoms, and he had CT scan of the head which was unremarkable aside from some frothy secretions in his sphenoid sinus.  He was treated with Augmentin for possible sinusitis.  He reports no improvement of his headaches with this medication.  HPI     Past Medical History:  Diagnosis Date   Alcohol abuse, in remission    Anal fissure    Anemia    Ankylosing spondylitis (HCC)    Anxiety    Arthritis    Chronic diarrhea    Chronic headaches    Chronic pain    Colitis    Colon polyps    Depression    Esophagitis    Gastric polyps    hyperplastic and fundic gland   Gastritis    GERD (gastroesophageal reflux disease)    Hypertension    IBS (irritable bowel syndrome)    Poor dentition    SIADH (syndrome of inappropriate ADH production) ([place])     Patient Active Problem List   Diagnosis Date Noted   Family history of colon cancer 12/02/2018   Gastric polyps    Schatzki's ring    Left sided ulcerative colitis without complication ([place])    Odynophagia 11/25/2018   Dysphagia 11/25/2018   Major depressive disorder, recurrent severe without psychotic features ([place]) 06/24/2018   IBS (irritable bowel syndrome)    Hypertension    Colitis     Adjustment disorder with mixed anxiety and depressed mood 05/11/2017   Alcohol abuse    Anxiety    Gastroesophageal reflux disease    Osteopenia determined by x-ray 08/06/2014   Stress fracture of calcaneus 08/06/2014   Vitamin D deficiency 12/18/2013   Dementia ([place]) 12/18/2013   Benign microscopic hematuria 07/06/2013   SIADH (syndrome of inappropriate ADH production) ([place]) 06/22/2013   Ankylosing spondylitis ([place]) 06/20/2013   Malnutrition of moderate degree ([place]) 06/20/2013   BPH (benign prostatic hyperplasia) 08/22/2010   History of colonic polyps 08/13/2008   Essential hypertension 07/03/2007   PUD (peptic ulcer disease) 07/03/2007    Past Surgical History:  Procedure Laterality Date   BIOPSY  11/26/2018   Procedure: BIOPSY;  Surgeon: Gatha Mayer, MD;  Location: [facility];  Service: Endoscopy;;   COLONOSCOPY W/ BIOPSIES     COLONOSCOPY WITH PROPOFOL N/A 11/26/2018   Procedure: COLONOSCOPY WITH PROPOFOL;  Surgeon: Gatha Mayer, MD;  Location: [facility];  Service: Endoscopy;  Laterality: N/A;   ESOPHAGOGASTRODUODENOSCOPY     ESOPHAGOGASTRODUODENOSCOPY (EGD) WITH PROPOFOL N/A 11/26/2018   Procedure: ESOPHAGOGASTRODUODENOSCOPY (EGD) WITH PROPOFOL;  Surgeon: Gatha Mayer, MD;  [facility]: [facility];  Service: Endoscopy;  Laterality: N/A;   HEMOSTASIS CLIP PLACEMENT  11/26/2018   Procedure:  HEMOSTASIS CLIP PLACEMENT;  Surgeon: Gatha Mayer, MD;  Location: [facility];  Service: Endoscopy;;   HERNIA REPAIR Bilateral    HOT HEMOSTASIS N/A 11/26/2018   Procedure: HOT HEMOSTASIS (ARGON PLASMA COAGULATION/BICAP);  Surgeon: Gatha Mayer, MD;  Location: [facility];  Service: Endoscopy;  Laterality: N/A;   OPEN REDUCTION INTERNAL FIXATION (ORIF) DISTAL RADIAL FRACTURE Right 02/11/2018   Procedure: OPEN REDUCTION INTERNAL FIXATION (ORIF) DISTAL RADIAL FRACTURE;  Surgeon: Milly Jakob, MD;  Location: [facility];  Service: Orthopedics;  Laterality: Right;   POLYPECTOMY   11/26/2018   Procedure: POLYPECTOMY;  Surgeon: Gatha Mayer, MD;  [facility];  Service: Endoscopy;;   TONSILLECTOMY         Family History  Problem Relation Age of Onset   Anxiety disorder Mother    Congestive Heart Failure Mother    Crohn's disease Mother    Colon cancer Mother 16   Arthritis Father    High blood pressure Father    Crohn's disease Father     Social History   Tobacco Use   Smoking status: Former    Types: Cigarettes    Quit date: 2014    Years since quitting: 8.5   Smokeless tobacco: Never  Vaping Use   Vaping Use: Never used  Substance Use Topics   Alcohol use: Yes    Comment: 1 25 oz of beer a night   Drug use: Yes    Types: Marijuana    Comment: other night    Home Medications Prior to Admission medications   Medication Sig Start Date End Date Taking? Authorizing Provider  acetaminophen (TYLENOL) 325 MG tablet Take 2 tablets (650 mg total) by mouth every 6 (six) hours as needed for mild pain. (May buy over the counter) 06/27/18   Connye Burkitt, NP  brexpiprazole (REXULTI) 2 MG TABS tablet Take 2 mg by mouth daily.     [provider]  clonazePAM (KLONOPIN) 1 MG tablet Take 1 mg by mouth 3 (three) times daily.    [provider]  DULoxetine (CYMBALTA) 30 MG capsule Take 60 mg by mouth daily.     [provider]  Eszopiclone 3 MG TABS Take 3 mg by mouth at bedtime. 08/24/20   [provider]  lisinopril (PRINIVIL,ZESTRIL) 10 MG tablet Take 1 tablet (10 mg total) by mouth daily. Patient taking differently: Take 10 mg by mouth at bedtime. 08/21/17 11/24/20  Melanee Spry, MD  Multiple Vitamin (MULTIVITAMIN WITH MINERALS) TABS tablet Take 1 tablet by mouth daily. (May buy over the counter) 06/28/18   Connye Burkitt, NP  ondansetron (ZOFRAN ODT) 4 MG disintegrating tablet Take 1 tablet (4 mg total) by mouth every 8 (eight) hours as needed for nausea or vomiting. 11/24/20   Tegeler, Gwenyth Allegra, MD   pantoprazole (PROTONIX) 40 MG tablet Take 1 tablet (40 mg total) by mouth daily. 04/24/14   Janith Lima, MD  QUEtiapine (SEROQUEL) 300 MG tablet Take 300 mg by mouth at bedtime.     [provider]  sodium chloride (OCEAN) 0.65 % SOLN nasal spray Place 2 sprays into both nostrils 4 (four) times daily as needed for congestion.     [provider]  sucralfate (CARAFATE) 1 G tablet Take 1 tablet (1 g total) by mouth 4 (four) times daily -  with meals and at bedtime. Chew before swallowing Patient taking differently: Take 1 g by mouth 4 (four) times daily. Chew before swallowing 12/17/13  Janith Lima, MD  tiZANidine (ZANAFLEX) 4 MG tablet Take 2 mg by mouth 3 (three) times daily.    [provider]  verapamil (CALAN-SR) 240 MG CR tablet Take 240 mg by mouth at bedtime. 02/09/17   [provider]    Allergies    Diphenhydramine hcl, Flexeril [cyclobenzaprine], Hctz [hydrochlorothiazide], Nsaids, Gabapentin, and Hydroxyzine  Review of Systems   Review of Systems  Constitutional:  Negative for chills and fever.  HENT:  Negative for sore throat.   Eyes:  Negative for pain and visual disturbance.  Respiratory:  Negative for cough and shortness of breath.   Cardiovascular:  Negative for chest pain and palpitations.  Gastrointestinal:  Positive for nausea and vomiting. Negative for abdominal pain.  Genitourinary:  Negative for dysuria and hematuria.  Musculoskeletal:  Positive for neck pain. Negative for arthralgias.  Skin:  Negative for color change and rash.  Neurological:  Positive for light-headedness and headaches. Negative for syncope.  All other systems reviewed and are negative.  Physical Exam Updated Vital Signs BP (!) 164/76 (BP Location: Left Arm)   Pulse 80   Temp 98 F (36.7 C) (Oral)   Resp 18   Ht 5' 10"  (1.778 m)   Wt 74.9 kg   SpO2 98%   BMI 23.69 kg/m   Physical Exam Constitutional:      General: He is not in acute  distress. HENT:     Head: Normocephalic and atraumatic.  Eyes:     Conjunctiva/sclera: Conjunctivae normal.     Pupils: Pupils are equal, round, and reactive to light.  Cardiovascular:     Rate and Rhythm: Normal rate and regular rhythm.  Pulmonary:     Effort: Pulmonary effort is normal. No respiratory distress.  Abdominal:     General: There is no distension.     Tenderness: There is no abdominal tenderness.  Skin:    General: Skin is warm and dry.  Neurological:     General: No focal deficit present.     Mental Status: He is alert. Mental status is at baseline.     GCS: GCS eye subscore is 4. GCS verbal subscore is 5. GCS motor subscore is 6.  Psychiatric:        Mood and Affect: Mood normal.        Behavior: Behavior normal.    ED Results / Procedures / Treatments   Labs (all labs ordered are listed, but only abnormal results are displayed) Labs Reviewed  COMPREHENSIVE METABOLIC PANEL - Abnormal; Notable for the following components:      Result Value   Sodium 131 (*)    Chloride 94 (*)    Glucose, Bld 117 (*)    BUN 5 (*)    All other components within normal limits  CBC - Abnormal; Notable for the following components:   WBC 13.5 (*)    RBC 4.20 (*)    HCT 38.6 (*)    All other components within normal limits    EKG None  Radiology No results found.  Procedures Procedures   Medications Ordered in ED Medications  LORazepam (ATIVAN) injection 0.5 mg (0.5 mg Intravenous Given 12/18/20 1940)  metoCLOPramide (REGLAN) injection 10 mg (10 mg Intravenous Given 12/18/20 1942)  sodium chloride 0.9 % bolus 500 mL (0 mLs Intravenous Stopped 12/18/20 2341)  fentaNYL (SUBLIMAZE) injection 50 mcg (50 mcg Intravenous Given 12/18/20 2116)  HYDROmorphone (DILAUDID) injection 1 mg (1 mg Intravenous Given 12/18/20 2250)    ED Course  I have reviewed the triage vital signs and the nursing notes.  Pertinent labs & imaging results that were available during my care of the  patient were reviewed by me and considered in my medical decision making (see chart for details).  Likely an occipital muscular type headache from his chronic ankylosing spondylitis and pain in his neck, he has significant tenderness of the trapezius muscle and trigger point tenderness around the cervical spine.  There are no falls or trauma to suspect a spinal fracture.  I doubt this is an acute vertebral or vascular dissection.  He has no neurological deficits to suggest this.  His frontal headache may be tension type.  As it is unchanged from a CT 3 weeks ago, I do not believe we need another emergent CT at this time.  Will give him some medications to help with pain and nausea and muscle spasms.  Reassess him afterwards.  *  Improvement of HA and patient tolerating PO after medications. Labs reviewed and largely unremarkable. I doubt sepsis, meningitis, CVA, vascular injury, or [place] records reviewed - the patient has had 8 ED visits in the past 6 months for recurring headache and neck pain, nausea.  His recurring labs and CT imaging have been reassuring.   He was advised again to contact a neurologist for follow up.     Final Clinical Impression(s) / ED Diagnoses Final diagnoses:  Nonintractable headache, unspecified chronicity pattern, unspecified headache type  Neck pain    Rx / DC Orders ED Discharge Orders     None        Wyvonnia Dusky, MD 12/19/20 1106

## 2020-12-18 NOTE — ED Notes (Signed)
Pt verbalizes understanding of discharge instructions. Opportunity for questioning and answers were provided. [place] removed by staff, pt discharged from ED to home with [facility]

## 2020-12-18 NOTE — ED Notes (Signed)
Present with complains of neck pain and stiffness.  States has neck problems for months but today worse and unable to move neck.  Having nausea and vomiting.  Denies abdominal pain.

## 2020-12-18 NOTE — Discharge Instructions (Addendum)
You need to follow-up for your headache and neck pain with your primary care doctor.

## 2020-12-18 NOTE — ED Notes (Signed)
Pt ambulated to side of bed to use urinal.

## 2020-12-18 NOTE — ED Triage Notes (Signed)
He c/o persistent (chronic) neck discomfort; generalized h/a and "I vomited a few times today". The paramedic states pt. Was smoking "marijuana" upon their arrival at pt.'s home. He is awake, alert and in no distress.

## 2020-12-24 ENCOUNTER — Encounter (HOSPITAL_BASED_OUTPATIENT_CLINIC_OR_DEPARTMENT_OTHER): Payer: Self-pay

## 2020-12-24 ENCOUNTER — Emergency Department (HOSPITAL_BASED_OUTPATIENT_CLINIC_OR_DEPARTMENT_OTHER)
Admission: EM | Admit: 2020-12-24 | Discharge: 2020-12-24 | Disposition: A | Discharge status: Home / Self Care | Payer: Medicaid Other | Attending: Emergency Medicine | Admitting: Emergency Medicine

## 2020-12-24 ENCOUNTER — Other Ambulatory Visit: Payer: Self-pay

## 2020-12-24 DIAGNOSIS — M542 Cervicalgia: Secondary | ICD-10-CM | POA: Insufficient documentation

## 2020-12-24 DIAGNOSIS — F419 Anxiety disorder, unspecified: Secondary | ICD-10-CM | POA: Insufficient documentation

## 2020-12-24 DIAGNOSIS — R110 Nausea: Secondary | ICD-10-CM | POA: Diagnosis not present

## 2020-12-24 DIAGNOSIS — Z87891 Personal history of nicotine dependence: Secondary | ICD-10-CM | POA: Diagnosis not present

## 2020-12-24 DIAGNOSIS — G8929 Other chronic pain: Secondary | ICD-10-CM | POA: Insufficient documentation

## 2020-12-24 DIAGNOSIS — R1013 Epigastric pain: Secondary | ICD-10-CM

## 2020-12-24 DIAGNOSIS — R519 Headache, unspecified: Secondary | ICD-10-CM | POA: Diagnosis not present

## 2020-12-24 DIAGNOSIS — Z79899 Other long term (current) drug therapy: Secondary | ICD-10-CM | POA: Insufficient documentation

## 2020-12-24 DIAGNOSIS — I10 Essential (primary) hypertension: Secondary | ICD-10-CM | POA: Diagnosis not present

## 2020-12-24 LAB — CBC
HCT: 37.8 % — ABNORMAL LOW (ref 39.0–52.0)
Hemoglobin: 12.8 g/dL — ABNORMAL LOW (ref 13.0–17.0)
MCH: 31.3 pg (ref 26.0–34.0)
MCHC: 33.9 g/dL (ref 30.0–36.0)
MCV: 92.4 fL (ref 80.0–100.0)
Platelets: 269 10*3/uL (ref 150–400)
RBC: 4.09 MIL/uL — ABNORMAL LOW (ref 4.22–5.81)
RDW: 12.5 % (ref 11.5–15.5)
WBC: 7.4 10*3/uL (ref 4.0–10.5)
nRBC: 0 % (ref 0.0–0.2)

## 2020-12-24 LAB — BASIC METABOLIC PANEL
Anion gap: 8 (ref 5–15)
BUN: 5 mg/dL — ABNORMAL LOW (ref 8–23)
CO2: 28 mmol/L (ref 22–32)
Calcium: 8.7 mg/dL — ABNORMAL LOW (ref 8.9–10.3)
Chloride: 101 mmol/L (ref 98–111)
Creatinine, Ser: 0.58 mg/dL — ABNORMAL LOW (ref 0.61–1.24)
GFR, Estimated: >60 mL/min (ref >60)
Glucose, Bld: 93 mg/dL (ref 70–99)
Potassium: 3.8 mmol/L (ref 3.5–5.1)
Sodium: 137 mmol/L (ref 135–145)

## 2020-12-24 MED ORDER — LORAZEPAM 2 MG/ML IJ SOLN
0.5000 mg | Freq: Once | INTRAMUSCULAR | Status: AC
  Administered 2020-12-24: 0.5 mg via INTRAVENOUS
  Filled 2020-12-24: qty 1

## 2020-12-24 MED ORDER — FAMOTIDINE IN NACL 20-0.9 MG/50ML-% IV SOLN
20.0000 mg | Freq: Once | INTRAVENOUS | Status: AC
  Administered 2020-12-24: 20 mg via INTRAVENOUS
  Filled 2020-12-24: qty 50

## 2020-12-24 MED ORDER — FENTANYL CITRATE (PF) 100 MCG/2ML IJ SOLN
25.0000 ug | Freq: Once | INTRAMUSCULAR | Status: AC
  Administered 2020-12-24: 25 ug via INTRAVENOUS
  Filled 2020-12-24: qty 2

## 2020-12-24 MED ORDER — OXYCODONE-ACETAMINOPHEN 5-325 MG PO TABS
1.0000 | ORAL_TABLET | Freq: Once | ORAL | Status: AC
  Administered 2020-12-24: 1 via ORAL
  Filled 2020-12-24: qty 1

## 2020-12-24 MED ORDER — SODIUM CHLORIDE 0.9 % IV BOLUS
500.0000 mL | Freq: Once | INTRAVENOUS | Status: AC
  Administered 2020-12-24: 500 mL via INTRAVENOUS

## 2020-12-24 NOTE — ED Notes (Signed)
Collected and resent lt green [place] to lab.

## 2020-12-24 NOTE — Discharge Instructions (Addendum)
1.  You have chronic problems with anxiety and pain.  It is very important that you work closely with your outpatient doctors to manage these conditions and give you treatment.  Have been given treatment in the emergency department for temporary relief of symptoms.  Your lab work is normal today.  No signs of problems with your sodium, potassium or blood counts.  Continue to take your Protonix daily.  You may add over-the-counter Pepcid twice daily for additional control of symptoms of reflux.  Follow instructions for reflux diet included in your discharge instructions. 2.  You were seen in the emergency department on a regular basis for anxiety symptoms.  This needs to be treated with your outpatient psychologist and doctor.  Call them today to get an appointment as soon as possible

## 2020-12-24 NOTE — ED Provider Notes (Signed)
[facility] EMERGENCY DEPT Provider Note   CSN: [id number] Arrival date & time: 12/24/20  1062     History Chief Complaint  Patient presents with   Nausea   Emesis   Neck Pain    Anthony Lamb is a 65 y.o. male.  HPI Patient reports that he has nausea and feel like he is dehydrated.  He reports has had a hard time eating.  He reports once the food hits his stomach it chest seems to come back up.  He reports he gets a lot of burning in his stomach and esophagus.  Patient reports he has an appoint with gastroenterology in a month.  He reports he is worried he has stomach cancer.  He reports he is taking daily Protonix.  He is concerned that he has had weight loss.  Patient reports his neck really hurts.  He does have chronic neck pain.  Reports it hurts slightly more to the side of the spine on the left.  Reports he has ankylosing spondylitis and he has a lot of pain with any movement.  No new weakness numbness or tingling to the extremities.  Patient reports he has bad sinus infection all the time.  He reports it gives him a headache in the front of his forehead into the back of his head.  No active drainage discharge or bleeding from the nares.  No fever no chills.  No confusion.  Chronic neck pain as outlined above.  Patient reports he has bad anxiety.  He reports he feels nervous, anxious and tremulous frequently.  He reports it is currently worse spite home medications.    Past Medical History:  Diagnosis Date   Alcohol abuse, in remission    Anal fissure    Anemia    Ankylosing spondylitis (HCC)    Anxiety    Arthritis    Chronic diarrhea    Chronic headaches    Chronic pain    Colitis    Colon polyps    Depression    Esophagitis    Gastric polyps    hyperplastic and fundic gland   Gastritis    GERD (gastroesophageal reflux disease)    Hypertension    IBS (irritable bowel syndrome)    Poor dentition    SIADH (syndrome of inappropriate ADH  production) ([place])     Patient Active Problem List   Diagnosis Date Noted   Family history of colon cancer 12/02/2018   Gastric polyps    Schatzki's ring    Left sided ulcerative colitis without complication ([place])    Odynophagia 11/25/2018   Dysphagia 11/25/2018   Major depressive disorder, 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/24/2018   IBS (irritable bowel syndrome)    Hypertension    Colitis    Adjustment disorder with mixed anxiety and depressed mood 05/11/2017   Alcohol abuse    Anxiety    Gastroesophageal reflux disease    Osteopenia determined by x-ray 08/06/2014   Stress fracture of calcaneus 08/06/2014   Vitamin D deficiency 12/18/2013   Dementia ([place]) 12/18/2013   Benign microscopic hematuria 07/06/2013   SIADH (syndrome of inappropriate ADH production) ([place]) 06/22/2013   Ankylosing spondylitis ([place]) 06/20/2013   Malnutrition of moderate degree ([place]) 06/20/2013   BPH (benign prostatic hyperplasia) 08/22/2010   History of colonic polyps 08/13/2008   Essential hypertension 07/03/2007   PUD (peptic ulcer disease) 07/03/2007    Past Surgical History:  Procedure Laterality Date   BIOPSY  11/26/2018   Procedure:  BIOPSY;  Surgeon: Gatha Mayer, MD;  Location: [facility];  Service: Endoscopy;;   COLONOSCOPY W/ BIOPSIES     COLONOSCOPY WITH PROPOFOL N/A 11/26/2018   Procedure: COLONOSCOPY WITH PROPOFOL;  Surgeon: Gatha Mayer, MD;  [facility];  Service: Endoscopy;  Laterality: N/A;   ESOPHAGOGASTRODUODENOSCOPY     ESOPHAGOGASTRODUODENOSCOPY (EGD) WITH PROPOFOL N/A 11/26/2018   Procedure: ESOPHAGOGASTRODUODENOSCOPY (EGD) WITH PROPOFOL;  Surgeon: Gatha Mayer, MD;  [facility];  Service: Endoscopy;  Laterality: N/A;   HEMOSTASIS CLIP PLACEMENT  11/26/2018   Procedure: HEMOSTASIS CLIP PLACEMENT;  Surgeon: Gatha Mayer, MD;  [facility];  Service: Endoscopy;;   HERNIA REPAIR Bilateral    HOT HEMOSTASIS N/A 11/26/2018    Procedure: HOT HEMOSTASIS (ARGON PLASMA COAGULATION/BICAP);  Surgeon: Gatha Mayer, MD;  Location: [facility];  Service: Endoscopy;  Laterality: N/A;   OPEN REDUCTION INTERNAL FIXATION (ORIF) DISTAL RADIAL FRACTURE Right 02/11/2018   Procedure: OPEN REDUCTION INTERNAL FIXATION (ORIF) DISTAL RADIAL FRACTURE;  Surgeon: Milly Jakob, MD;  Location: [facility];  Service: Orthopedics;  Laterality: Right;   POLYPECTOMY  11/26/2018   Procedure: POLYPECTOMY;  Surgeon: Gatha Mayer, MD;  [facility];  Service: Endoscopy;;   TONSILLECTOMY         Family History  Problem Relation Age of Onset   Anxiety disorder Mother    Congestive Heart Failure Mother    Crohn's disease Mother    Colon cancer Mother 76   Arthritis Father    High blood pressure Father    Crohn's disease Father     Social History   Tobacco Use   Smoking status: Former    Types: Cigarettes    Quit date: 2014    Years since quitting: 8.5   Smokeless tobacco: Never  Vaping Use   Vaping Use: Never used  Substance Use Topics   Alcohol use: Yes    Comment: 1 25 oz of beer a night   Drug use: Yes    Types: Marijuana    Comment: other night    Home Medications Prior to Admission medications   Medication Sig Start Date End Date Taking? Authorizing Provider  acetaminophen (TYLENOL) 325 MG tablet Take 2 tablets (650 mg total) by mouth every 6 (six) hours as needed for mild pain. (May buy over the counter) 06/27/18   Connye Burkitt, NP  brexpiprazole (REXULTI) 2 MG TABS tablet Take 2 mg by mouth daily.     [provider]  clonazePAM (KLONOPIN) 1 MG tablet Take 1 mg by mouth 3 (three) times daily.    [provider]  DULoxetine (CYMBALTA) 30 MG capsule Take 60 mg by mouth daily.     [provider]  Eszopiclone 3 MG TABS Take 3 mg by mouth at bedtime. 08/24/20   [provider]  lisinopril (PRINIVIL,ZESTRIL) 10 MG tablet Take 1 tablet (10 mg total) by mouth daily. Patient  taking differently: Take 10 mg by mouth at bedtime. 08/21/17 11/24/20  Melanee Spry, MD  Multiple Vitamin (MULTIVITAMIN WITH MINERALS) TABS tablet Take 1 tablet by mouth daily. (May buy over the counter) 06/28/18   Connye Burkitt, NP  ondansetron (ZOFRAN ODT) 4 MG disintegrating tablet Take 1 tablet (4 mg total) by mouth every 8 (eight) hours as needed for nausea or vomiting. 11/24/20   Tegeler, Gwenyth Allegra, MD  pantoprazole (PROTONIX) 40 MG tablet Take 1 tablet (40 mg total) by mouth daily. 04/24/14   Janith Lima, MD  QUEtiapine (SEROQUEL) 300 MG tablet Take 300 mg by mouth at bedtime.     [provider]  sodium chloride (OCEAN) 0.65 % SOLN nasal spray Place 2 sprays into both nostrils 4 (four) times daily as needed for congestion.     [provider]  sucralfate (CARAFATE) 1 G tablet Take 1 tablet (1 g total) by mouth 4 (four) times daily -  with meals and at bedtime. Chew before swallowing Patient taking differently: Take 1 g by mouth 4 (four) times daily. Chew before swallowing 12/17/13   Janith Lima, MD  tiZANidine (ZANAFLEX) 4 MG tablet Take 2 mg by mouth 3 (three) times daily.    [provider]  verapamil (CALAN-SR) 240 MG CR tablet Take 240 mg by mouth at bedtime. 02/09/17   [provider]    Allergies    Diphenhydramine hcl, Flexeril [cyclobenzaprine], Hctz [hydrochlorothiazide], Nsaids, Gabapentin, and Hydroxyzine  Review of Systems   Review of Systems 10 systems reviewed and negative except as per HPI Physical Exam Updated Vital Signs BP (!) 144/67   Pulse 69   Temp 98.2 F (36.8 C) (Oral)   Resp 19   Ht 5' 10"  (1.778 m)   Wt 72.6 kg   SpO2 100%   BMI 22.96 kg/m   Physical Exam Constitutional:      Comments: Alert nontoxic no respiratory distress.  Well-nourished well-developed.  HENT:     Head:     Comments: No facial swelling or redness.  No periorbital swelling.    Nose: Nose normal.     Mouth/Throat:     Comments:  Mucous membranes are clear with wide open posterior oropharynx.  Slight white plaque on the tongue posteriorly. Eyes:     Extraocular Movements: Extraocular movements intact.     Pupils: Pupils are equal, round, and reactive to light.  Neck:     Comments: No midline C-spine tenderness.  Tenderness left paraspinous muscle bodies. Cardiovascular:     Rate and Rhythm: Normal rate and regular rhythm.  Pulmonary:     Effort: Pulmonary effort is normal.     Breath sounds: Normal breath sounds.  Abdominal:     General: There is no distension.     Palpations: Abdomen is soft.     Tenderness: There is no abdominal tenderness. There is no guarding.  Musculoskeletal:        General: No swelling or tenderness. Normal range of motion.     Right lower leg: No edema.     Left lower leg: No edema.  Skin:    General: Skin is warm and dry.  Neurological:     General: No focal deficit present.     Mental Status: He is oriented to person, place, and time.     Motor: No weakness.     Coordination: Coordination normal.  Psychiatric:     Comments: Anxious with voice slightly tremulous.    ED Results / Procedures / Treatments   Labs (all labs ordered are listed, but only abnormal results are displayed) Labs Reviewed  CBC - Abnormal; Notable for the following components:      Result Value   RBC 4.09 (*)    Hemoglobin 12.8 (*)    HCT 37.8 (*)    All other components within normal limits  BASIC METABOLIC PANEL - Abnormal; Notable for the following components:   BUN 5 (*)    Creatinine, Ser 0.58 (*)    Calcium 8.7 (*)    All other components  within normal limits    EKG None  Radiology No results found.  Procedures Procedures   Medications Ordered in ED Medications  oxyCODONE-acetaminophen (PERCOCET/ROXICET) 5-325 MG per tablet 1 tablet (has no administration in time range)  sodium chloride 0.9 % bolus 500 mL (500 mLs Intravenous New Bag/Given 12/24/20 0856)  fentaNYL (SUBLIMAZE)  injection 25 mcg (25 mcg Intravenous Given 12/24/20 0855)  LORazepam (ATIVAN) injection 0.5 mg (0.5 mg Intravenous Given 12/24/20 0854)  famotidine (PEPCID) IVPB 20 mg premix (20 mg Intravenous New Bag/Given 12/24/20 8984)    ED Course  I have reviewed the triage vital signs and the nursing notes.  Pertinent labs & imaging results that were available during my care of the patient were reviewed by me and considered in my medical decision making (see chart for details).    MDM Rules/Calculators/A&P                           Patient presents with chronic problems that have had significant evaluation.  Clinically no signs of any deterioration or active progression.  Vital signs are stable.  Patient does not have electrolyte derangement.  He was treated for acute exacerbation of chronic problems.  Patient needs to work closely with his outpatient providers. Final Clinical Impression(s) / ED Diagnoses Final diagnoses:  Chronic neck pain  Dyspepsia  Chronic anxiety    Rx / DC Orders ED Discharge Orders     None        Charlesetta Shanks, MD 12/24/20 1008

## 2020-12-24 NOTE — ED Triage Notes (Signed)
Patient here for Neck Pain and N/V.   Patient has Chronic Neck Pain from Previous Injuries and states he has been nauseus and has vomited 3-4 times since 0100 this AM.   Patient also complaining of Congestion from Sinus Infection.  Ambulatory but Weak. GCS 15. A&Ox4.

## 2021-01-05 ENCOUNTER — Emergency Department (HOSPITAL_BASED_OUTPATIENT_CLINIC_OR_DEPARTMENT_OTHER)
Admission: EM | Admit: 2021-01-05 | Discharge: 2021-01-05 | Disposition: A | Discharge status: Home / Self Care | Payer: Medicaid Other | Attending: Emergency Medicine | Admitting: Emergency Medicine

## 2021-01-05 ENCOUNTER — Encounter (HOSPITAL_BASED_OUTPATIENT_CLINIC_OR_DEPARTMENT_OTHER): Payer: Self-pay

## 2021-01-05 ENCOUNTER — Other Ambulatory Visit: Payer: Self-pay

## 2021-01-05 DIAGNOSIS — Z79899 Other long term (current) drug therapy: Secondary | ICD-10-CM | POA: Diagnosis not present

## 2021-01-05 DIAGNOSIS — R42 Dizziness and giddiness: Secondary | ICD-10-CM | POA: Insufficient documentation

## 2021-01-05 DIAGNOSIS — Z86010 Personal history of colonic polyps: Secondary | ICD-10-CM | POA: Diagnosis not present

## 2021-01-05 DIAGNOSIS — M542 Cervicalgia: Secondary | ICD-10-CM | POA: Diagnosis present

## 2021-01-05 DIAGNOSIS — I10 Essential (primary) hypertension: Secondary | ICD-10-CM | POA: Insufficient documentation

## 2021-01-05 DIAGNOSIS — F0390 Unspecified dementia without behavioral disturbance: Secondary | ICD-10-CM | POA: Insufficient documentation

## 2021-01-05 DIAGNOSIS — G894 Chronic pain syndrome: Secondary | ICD-10-CM | POA: Diagnosis not present

## 2021-01-05 DIAGNOSIS — R531 Weakness: Secondary | ICD-10-CM | POA: Insufficient documentation

## 2021-01-05 DIAGNOSIS — R112 Nausea with vomiting, unspecified: Secondary | ICD-10-CM | POA: Insufficient documentation

## 2021-01-05 DIAGNOSIS — Z87891 Personal history of nicotine dependence: Secondary | ICD-10-CM | POA: Insufficient documentation

## 2021-01-05 DIAGNOSIS — R197 Diarrhea, unspecified: Secondary | ICD-10-CM | POA: Diagnosis not present

## 2021-01-05 LAB — URINALYSIS, ROUTINE W REFLEX MICROSCOPIC
Bilirubin Urine: NEGATIVE
Glucose, UA: NEGATIVE mg/dL
Ketones, ur: NEGATIVE mg/dL
Leukocytes,Ua: NEGATIVE
Nitrite: NEGATIVE
Protein, ur: NEGATIVE mg/dL
Specific Gravity, Urine: 1.009 (ref 1.005–1.030)
pH: 6.5 (ref 5.0–8.0)

## 2021-01-05 LAB — CBC WITH DIFFERENTIAL/PLATELET
Abs Immature Granulocytes: 0.01 10*3/uL (ref 0.00–0.07)
Basophils Absolute: 0.1 10*3/uL (ref 0.0–0.1)
Basophils Relative: 1 %
Eosinophils Absolute: 0.1 10*3/uL (ref 0.0–0.5)
Eosinophils Relative: 2 %
HCT: 38.5 % — ABNORMAL LOW (ref 39.0–52.0)
Hemoglobin: 13.1 g/dL (ref 13.0–17.0)
Immature Granulocytes: 0 %
Lymphocytes Relative: 44 %
Lymphs Abs: 2.7 10*3/uL (ref 0.7–4.0)
MCH: 31.6 pg (ref 26.0–34.0)
MCHC: 34 g/dL (ref 30.0–36.0)
MCV: 92.8 fL (ref 80.0–100.0)
Monocytes Absolute: 0.6 10*3/uL (ref 0.1–1.0)
Monocytes Relative: 10 %
Neutro Abs: 2.6 10*3/uL (ref 1.7–7.7)
Neutrophils Relative %: 43 %
Platelets: 267 10*3/uL (ref 150–400)
RBC: 4.15 MIL/uL — ABNORMAL LOW (ref 4.22–5.81)
RDW: 12.5 % (ref 11.5–15.5)
WBC: 6 10*3/uL (ref 4.0–10.5)
nRBC: 0 % (ref 0.0–0.2)

## 2021-01-05 LAB — COMPREHENSIVE METABOLIC PANEL
ALT: 8 U/L (ref 0–44)
AST: 16 U/L (ref 15–41)
Albumin: 4.2 g/dL (ref 3.5–5.0)
Alkaline Phosphatase: 58 U/L (ref 38–126)
Anion gap: 10 (ref 5–15)
BUN: 6 mg/dL — ABNORMAL LOW (ref 8–23)
CO2: 27 mmol/L (ref 22–32)
Calcium: 9.3 mg/dL (ref 8.9–10.3)
Chloride: 98 mmol/L (ref 98–111)
Creatinine, Ser: 0.73 mg/dL (ref 0.61–1.24)
GFR, Estimated: >60 mL/min (ref >60)
Glucose, Bld: 98 mg/dL (ref 70–99)
Potassium: 3.9 mmol/L (ref 3.5–5.1)
Sodium: 135 mmol/L (ref 135–145)
Total Bilirubin: 0.5 mg/dL (ref 0.3–1.2)
Total Protein: 7.1 g/dL (ref 6.5–8.1)

## 2021-01-05 MED ORDER — LIDOCAINE 5 % EX PTCH
1.0000 | MEDICATED_PATCH | CUTANEOUS | Status: DC
  Administered 2021-01-05: 1 via TRANSDERMAL
  Filled 2021-01-05: qty 1

## 2021-01-05 MED ORDER — MECLIZINE HCL 25 MG PO TABS
25.0000 mg | ORAL_TABLET | Freq: Once | ORAL | Status: AC
  Administered 2021-01-05: 25 mg via ORAL
  Filled 2021-01-05: qty 1

## 2021-01-05 MED ORDER — OXYCODONE-ACETAMINOPHEN 5-325 MG PO TABS
1.0000 | ORAL_TABLET | Freq: Once | ORAL | Status: AC
  Administered 2021-01-05: 1 via ORAL
  Filled 2021-01-05: qty 1

## 2021-01-05 MED ORDER — LORAZEPAM 1 MG PO TABS
1.0000 mg | ORAL_TABLET | Freq: Once | ORAL | Status: AC
  Administered 2021-01-05: 1 mg via ORAL
  Filled 2021-01-05: qty 1

## 2021-01-05 MED ORDER — METOCLOPRAMIDE HCL 5 MG/ML IJ SOLN
10.0000 mg | Freq: Once | INTRAMUSCULAR | Status: DC
  Filled 2021-01-05: qty 2

## 2021-01-05 MED ORDER — DROPERIDOL 2.5 MG/ML IJ SOLN
1.2500 mg | Freq: Once | INTRAMUSCULAR | Status: AC
  Administered 2021-01-05: 1.25 mg via INTRAMUSCULAR
  Filled 2021-01-05: qty 2

## 2021-01-05 MED ORDER — DICLOFENAC SODIUM 1 % EX GEL: 2.0000 g | Freq: Four times a day (QID) | CUTANEOUS | 0 refills | Status: DC | PRN

## 2021-01-05 MED ORDER — DICLOFENAC SODIUM 1 % EX GEL: 2.0000 g | Freq: Once | CUTANEOUS | Status: DC

## 2021-01-05 MED ORDER — ONDANSETRON 4 MG PO TBDP: 4.0000 mg | ORAL_TABLET | Freq: Three times a day (TID) | ORAL | 0 refills | Status: DC | PRN

## 2021-01-05 NOTE — ED Triage Notes (Signed)
Patient arrives from home with c/o  neck pain and dizziness.  Neck pain is chronic.  3 vomiting episodes in 24 hours.

## 2021-01-05 NOTE — ED Provider Notes (Signed)
[facility] DEPT Provider Note   CSN: [id number] Arrival date & time: 01/05/21  6314     History Chief Complaint  Patient presents with   Dizziness   Neck Pain    Anthony Lamb is a 65 y.o. male.  The history is provided by the patient, the EMS personnel and medical records.  Dizziness Neck Pain Anthony Lamb is a 65 y.o. male who presents to the Emergency Department complaining of dizziness. He presents the emergency department by EMS complaining of dizziness that started about 30 minutes prior to ED evaluation. He states he last went to bed last night around 10 PM. When he woke up he had dizziness described as spinning sensation when he attempts to get up and walk. Symptoms resolve when he is at rest. He has associated nausea and vomiting but his vomiting started yesterday morning. He also complains of diarrhea that started yesterday morning. He does have some body aches since yesterday. No fevers, chest pain, difficulty breathing, numbness. He complains of generalized weakness. He also complains of pain to the neck (left greater than right), which he remarks has been present for a "very long time". He also complains headaches, an additional ongoing issue for him. No reports of injuries.  No blood thinners.  He uses marijuana. No alcohol use. He lives alone. No known sick contacts. At home he uses tylenol tid for pain.      Past Medical History:  Diagnosis Date   Alcohol abuse, in remission    Anal fissure    Anemia    Ankylosing spondylitis (HCC)    Anxiety    Arthritis    Chronic diarrhea    Chronic headaches    Chronic pain    Colitis    Colon polyps    Depression    Esophagitis    Gastric polyps    hyperplastic and fundic gland   Gastritis    GERD (gastroesophageal reflux disease)    Hypertension    IBS (irritable bowel syndrome)    Poor dentition    SIADH (syndrome of inappropriate ADH production) ([place])     Patient Active Problem  List   Diagnosis Date Noted   Family history of colon cancer 12/02/2018   Gastric polyps    Schatzki's ring    Left sided ulcerative colitis without complication ([place])    Odynophagia 11/25/2018   Dysphagia 11/25/2018   Major depressive disorder, 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   IBS (irritable bowel syndrome)    Hypertension    Colitis    Adjustment disorder with mixed anxiety and depressed mood 05/11/2017   Alcohol abuse    Anxiety    Gastroesophageal reflux disease    Osteopenia determined by x-ray 08/06/2014   Stress fracture of calcaneus 08/06/2014   Vitamin D deficiency 12/18/2013   Dementia ([place]) 12/18/2013   Benign microscopic hematuria 07/06/2013   SIADH (syndrome of inappropriate ADH production) ([place]) 06/22/2013   Ankylosing spondylitis ([place]) 06/20/2013   Malnutrition of moderate degree ([place]) 06/20/2013   BPH (benign prostatic hyperplasia) 08/22/2010   History of colonic polyps 08/13/2008   Essential hypertension 07/03/2007   PUD (peptic ulcer disease) 07/03/2007    Past Surgical History:  Procedure Laterality Date   BIOPSY  11/26/2018   Procedure: BIOPSY;  Surgeon: Gatha Mayer, MD;  Location: [facility];  Service: Endoscopy;;   COLONOSCOPY W/ BIOPSIES     COLONOSCOPY WITH PROPOFOL N/A 11/26/2018   Procedure: COLONOSCOPY WITH PROPOFOL;  Surgeon:  Gatha Mayer, MD;  Location: [facility];  Service: Endoscopy;  Laterality: N/A;   ESOPHAGOGASTRODUODENOSCOPY     ESOPHAGOGASTRODUODENOSCOPY (EGD) WITH PROPOFOL N/A 11/26/2018   Procedure: ESOPHAGOGASTRODUODENOSCOPY (EGD) WITH PROPOFOL;  Surgeon: Gatha Mayer, MD;  Location: [facility];  Service: Endoscopy;  Laterality: N/A;   HEMOSTASIS CLIP PLACEMENT  11/26/2018   Procedure: HEMOSTASIS CLIP PLACEMENT;  Surgeon: Gatha Mayer, MD;  Location: [facility];  Service: Endoscopy;;   HERNIA REPAIR Bilateral    HOT HEMOSTASIS N/A 11/26/2018   Procedure: HOT HEMOSTASIS (ARGON PLASMA  COAGULATION/BICAP);  Surgeon: Gatha Mayer, MD;  Location: [facility];  Service: Endoscopy;  Laterality: N/A;   OPEN REDUCTION INTERNAL FIXATION (ORIF) DISTAL RADIAL FRACTURE Right 02/11/2018   Procedure: OPEN REDUCTION INTERNAL FIXATION (ORIF) DISTAL RADIAL FRACTURE;  Surgeon: Milly Jakob, MD;  Location: [facility];  Service: Orthopedics;  Laterality: Right;   POLYPECTOMY  11/26/2018   Procedure: POLYPECTOMY;  Surgeon: Gatha Mayer, MD;  Location: [facility];  Service: Endoscopy;;   TONSILLECTOMY         Family History  Problem Relation Age of Onset   Anxiety disorder Mother    Congestive Heart Failure Mother    Crohn's disease Mother    Colon cancer Mother 25   Arthritis Father    High blood pressure Father    Crohn's disease Father     Social History   Tobacco Use   Smoking status: Former    Types: Cigarettes    Quit date: 2014    Years since quitting: 8.5   Smokeless tobacco: Never  Vaping Use   Vaping Use: Never used  Substance Use Topics   Alcohol use: Yes    Comment: 1 25 oz of beer a night   Drug use: Yes    Types: Marijuana    Comment: other night    Home Medications Prior to Admission medications   Medication Sig Start Date End Date Taking? Authorizing Provider  diclofenac Sodium (VOLTAREN) 1 % GEL Apply 2 g topically 4 (four) times daily as needed. 01/05/21  Yes Quintella Reichert, MD  ondansetron (ZOFRAN ODT) 4 MG disintegrating tablet Take 1 tablet (4 mg total) by mouth every 8 (eight) hours as needed for nausea or vomiting. 01/05/21  Yes Quintella Reichert, MD  acetaminophen (TYLENOL) 325 MG tablet Take 2 tablets (650 mg total) by mouth every 6 (six) hours as needed for mild pain. (May buy over the counter) 06/27/18   Connye Burkitt, NP  brexpiprazole (REXULTI) 2 MG TABS tablet Take 2 mg by mouth daily.     [provider]  clonazePAM (KLONOPIN) 1 MG tablet Take 1 mg by mouth 3 (three) times daily.    [provider]  DULoxetine (CYMBALTA) 30  MG capsule Take 60 mg by mouth daily.     [provider]  Eszopiclone 3 MG TABS Take 3 mg by mouth at bedtime. 08/24/20   [provider]  lisinopril (PRINIVIL,ZESTRIL) 10 MG tablet Take 1 tablet (10 mg total) by mouth daily. Patient taking differently: Take 10 mg by mouth at bedtime. 08/21/17 11/24/20  Melanee Spry, MD  Multiple Vitamin (MULTIVITAMIN WITH MINERALS) TABS tablet Take 1 tablet by mouth daily. (May buy over the counter) 06/28/18   Connye Burkitt, NP  pantoprazole (PROTONIX) 40 MG tablet Take 1 tablet (40 mg total) by mouth daily. 04/24/14   Janith Lima, MD  QUEtiapine (SEROQUEL) 300 MG tablet Take 300 mg by mouth at bedtime.  [provider]  sodium chloride (OCEAN) 0.65 % SOLN nasal spray Place 2 sprays into both nostrils 4 (four) times daily as needed for congestion.     [provider]  sucralfate (CARAFATE) 1 G tablet Take 1 tablet (1 g total) by mouth 4 (four) times daily -  with meals and at bedtime. Chew before swallowing Patient taking differently: Take 1 g by mouth 4 (four) times daily. Chew before swallowing 12/17/13   Janith Lima, MD  tiZANidine (ZANAFLEX) 4 MG tablet Take 2 mg by mouth 3 (three) times daily.    [provider]  verapamil (CALAN-SR) 240 MG CR tablet Take 240 mg by mouth at bedtime. 02/09/17   [provider]    Allergies    Diphenhydramine hcl, Flexeril [cyclobenzaprine], Hctz [hydrochlorothiazide], Nsaids, Gabapentin, and Hydroxyzine  Review of Systems   Review of Systems  Musculoskeletal:  Positive for neck pain.  Neurological:  Positive for dizziness.  All other systems reviewed and are negative.  Physical Exam Updated Vital Signs BP (!) 144/82 (BP Location: Right Arm)   Pulse 74   Ht 5' 10"  (1.778 m)   Wt 72.6 kg   SpO2 97%   BMI 22.96 kg/m   Physical Exam Vitals and nursing note reviewed.  Constitutional:      Appearance: He is well-developed. He is not ill-appearing.   HENT:     Head: Normocephalic and atraumatic.  Cardiovascular:     Rate and Rhythm: Normal rate and regular rhythm.     Heart sounds: No murmur heard. Pulmonary:     Effort: Pulmonary effort is normal. No respiratory distress.     Breath sounds: Normal breath sounds.  Abdominal:     Palpations: Abdomen is soft.     Tenderness: There is no abdominal tenderness. There is no guarding or rebound.  Musculoskeletal:        General: No tenderness.  Skin:    General: Skin is warm and dry.  Neurological:     Mental Status: He is alert and oriented to person, place, and time.     Comments: Pupils equal round and reactive, EOM I. No asymmetry of facial movements. Five out of five strength in all four extremities with sensation to light touch intact in all four extremities  Psychiatric:        Behavior: Behavior normal.    ED Results / Procedures / Treatments   Labs (all labs ordered are listed, but only abnormal results are displayed) Labs Reviewed  COMPREHENSIVE METABOLIC PANEL - Abnormal; Notable for the following components:      Result Value   BUN 6 (*)    All other components within normal limits  CBC WITH DIFFERENTIAL/PLATELET - Abnormal; Notable for the following components:   RBC 4.15 (*)    HCT 38.5 (*)    All other components within normal limits  URINALYSIS, ROUTINE W REFLEX MICROSCOPIC - Abnormal; Notable for the following components:   Hgb urine dipstick TRACE (*)    All other components within normal limits    EKG EKG Interpretation  Date/Time:  Wednesday January 05 2021 05:48:42 EDT Ventricular Rate:  71 PR Interval:    QRS Duration: 127 QT Interval:  449 QTC Calculation: 488 R Axis:   65 Text Interpretation: Sinus rhythm Nonspecific intraventricular conduction delay Artifact Confirmed by Quintella Reichert [phone]) on 01/05/2021 6:04:07 AM  Radiology No results found.  Procedures Procedures   Medications Ordered in ED Medications  lidocaine (LIDODERM) 5 % 1  patch (1 patch Transdermal Patch Applied 01/05/21 0546)  oxyCODONE-acetaminophen (PERCOCET/ROXICET) 5-325 MG per tablet 1 tablet (has no administration in time range)  meclizine (ANTIVERT) tablet 25 mg (25 mg Oral Given 01/05/21 0544)  LORazepam (ATIVAN) tablet 1 mg (1 mg Oral Given 01/05/21 0600)  droperidol (INAPSINE) 2.5 MG/ML injection 1.25 mg (1.25 mg Intramuscular Given 01/05/21 2863)    ED Course  I have reviewed the triage vital signs and the nursing notes.  Pertinent labs & imaging results that were available during my care of the patient were reviewed by me and considered in my medical decision making (see chart for details).    MDM Rules/Calculators/A&P                          patient with history of ankylosing spondylitis, chronic headache and neck pain here for evaluation of nausea and vomiting for 24 hours as well as 30 minutes of vertigo symptoms. He also complains of his chronic head and neck pain. He is neurologically intact on evaluation. No reports of trauma. Presentation is not consistent with acute CVA, subarachnoid hemorrhage, meningitis, epidural abscess, dissection. Plan to treat symptoms and reassess. Nausea and vomiting resolved after medications and he is able to tolerate oral liquids. Plan to discharge home with outpatient follow-up and return precautions  Labs without significant anemia or electrolyte abnormality. Final Clinical Impression(s) / ED Diagnoses Final diagnoses:  Chronic pain syndrome  Non-intractable vomiting with nausea, unspecified vomiting type    Rx / DC Orders ED Discharge Orders          Ordered    ondansetron (ZOFRAN ODT) 4 MG disintegrating tablet  Every 8 hours PRN        01/05/21 0654    diclofenac Sodium (VOLTAREN) 1 % GEL  4 times daily PRN        01/05/21 0654             Quintella Reichert, MD 01/05/21 [phone]

## 2021-01-07 ENCOUNTER — Ambulatory Visit: Payer: Medicaid Other | Admitting: Nurse Practitioner

## 2021-01-11 ENCOUNTER — Other Ambulatory Visit: Payer: Self-pay

## 2021-01-11 ENCOUNTER — Encounter (HOSPITAL_BASED_OUTPATIENT_CLINIC_OR_DEPARTMENT_OTHER): Payer: Self-pay | Admitting: *Deleted

## 2021-01-11 ENCOUNTER — Emergency Department (HOSPITAL_BASED_OUTPATIENT_CLINIC_OR_DEPARTMENT_OTHER)
Admission: EM | Admit: 2021-01-11 | Discharge: 2021-01-11 | Disposition: A | Discharge status: Home / Self Care | Payer: Medicaid Other | Attending: Emergency Medicine | Admitting: Emergency Medicine

## 2021-01-11 ENCOUNTER — Emergency Department (HOSPITAL_BASED_OUTPATIENT_CLINIC_OR_DEPARTMENT_OTHER): Payer: Medicaid Other

## 2021-01-11 DIAGNOSIS — Y900 Blood alcohol level of less than 20 mg/100 ml: Secondary | ICD-10-CM | POA: Diagnosis not present

## 2021-01-11 DIAGNOSIS — Z87891 Personal history of nicotine dependence: Secondary | ICD-10-CM | POA: Diagnosis not present

## 2021-01-11 DIAGNOSIS — I10 Essential (primary) hypertension: Secondary | ICD-10-CM | POA: Diagnosis not present

## 2021-01-11 DIAGNOSIS — R519 Headache, unspecified: Secondary | ICD-10-CM | POA: Diagnosis present

## 2021-01-11 DIAGNOSIS — F0390 Unspecified dementia without behavioral disturbance: Secondary | ICD-10-CM | POA: Diagnosis not present

## 2021-01-11 DIAGNOSIS — G894 Chronic pain syndrome: Secondary | ICD-10-CM | POA: Diagnosis not present

## 2021-01-11 DIAGNOSIS — Z79899 Other long term (current) drug therapy: Secondary | ICD-10-CM | POA: Diagnosis not present

## 2021-01-11 LAB — COMPREHENSIVE METABOLIC PANEL
ALT: 9 U/L (ref 0–44)
AST: 15 U/L (ref 15–41)
Albumin: 4.4 g/dL (ref 3.5–5.0)
Alkaline Phosphatase: 61 U/L (ref 38–126)
Anion gap: 10 (ref 5–15)
BUN: 7 mg/dL — ABNORMAL LOW (ref 8–23)
CO2: 27 mmol/L (ref 22–32)
Calcium: 9 mg/dL (ref 8.9–10.3)
Chloride: 96 mmol/L — ABNORMAL LOW (ref 98–111)
Creatinine, Ser: 0.73 mg/dL (ref 0.61–1.24)
GFR, Estimated: >60 mL/min (ref >60)
Glucose, Bld: 118 mg/dL — ABNORMAL HIGH (ref 70–99)
Potassium: 3.9 mmol/L (ref 3.5–5.1)
Sodium: 133 mmol/L — ABNORMAL LOW (ref 135–145)
Total Bilirubin: 0.4 mg/dL (ref 0.3–1.2)
Total Protein: 7 g/dL (ref 6.5–8.1)

## 2021-01-11 LAB — CBC
HCT: 38.9 % — ABNORMAL LOW (ref 39.0–52.0)
Hemoglobin: 13.3 g/dL (ref 13.0–17.0)
MCH: 31.6 pg (ref 26.0–34.0)
MCHC: 34.2 g/dL (ref 30.0–36.0)
MCV: 92.4 fL (ref 80.0–100.0)
Platelets: 294 10*3/uL (ref 150–400)
RBC: 4.21 MIL/uL — ABNORMAL LOW (ref 4.22–5.81)
RDW: 12.3 % (ref 11.5–15.5)
WBC: 8.1 10*3/uL (ref 4.0–10.5)
nRBC: 0 % (ref 0.0–0.2)

## 2021-01-11 LAB — URINALYSIS, ROUTINE W REFLEX MICROSCOPIC
Bilirubin Urine: NEGATIVE
Glucose, UA: NEGATIVE mg/dL
Hgb urine dipstick: NEGATIVE
Ketones, ur: NEGATIVE mg/dL
Leukocytes,Ua: NEGATIVE
Nitrite: NEGATIVE
Protein, ur: NEGATIVE mg/dL
Specific Gravity, Urine: 1.009 (ref 1.005–1.030)
pH: 7 (ref 5.0–8.0)

## 2021-01-11 LAB — SEDIMENTATION RATE: Sed Rate: 10 mm/hr (ref 0–16)

## 2021-01-11 LAB — MAGNESIUM: Magnesium: 1.8 mg/dL (ref 1.7–2.4)

## 2021-01-11 LAB — ETHANOL: Alcohol, Ethyl (B): <10 mg/dL (ref <10)

## 2021-01-11 LAB — LIPASE, BLOOD: Lipase: 20 U/L (ref 11–51)

## 2021-01-11 MED ORDER — ONDANSETRON HCL 4 MG/2ML IJ SOLN
4.0000 mg | Freq: Once | INTRAMUSCULAR | Status: AC
  Administered 2021-01-11: 4 mg via INTRAVENOUS
  Filled 2021-01-11: qty 2

## 2021-01-11 MED ORDER — FENTANYL CITRATE (PF) 100 MCG/2ML IJ SOLN
50.0000 ug | Freq: Once | INTRAMUSCULAR | Status: AC
  Administered 2021-01-11: 50 ug via INTRAVENOUS
  Filled 2021-01-11: qty 2

## 2021-01-11 NOTE — ED Triage Notes (Signed)
Chronic headache, dizziness and nausea.  Pt was here 6 days ago with the same complaint.

## 2021-01-11 NOTE — ED Notes (Signed)
Pt transported to CT ?

## 2021-01-11 NOTE — ED Provider Notes (Signed)
[facility] DE[facility] Provider Note   CSN: [id number] Arrival date & time: 01/11/21  5631     History Chief Complaint  Patient presents with   Headache   Dizziness   Emesis    Anthony Lamb is a 65 y.o. male.  Anthony Lamb presents to the ED complaining of sudden onset of left-sided headache, chronic neck pain, anxiety, decreased oral intake from lack of ability to chew solid food due to having no teeth.  He has been to the ED multiple times recently for the same issues.  I asked him if there was something that we were missing or some support that he was not receiving.  He is able to obtain food.  He gets [employer], and there are people who help him pick up food.  He does have to eat a soft diet because he does not have teeth.  He is able to go to his appointments.  He does endorse some depression.  He also states that he thinks his Ativan is not working as well as Klonopin did for his anxiety.  He is frustrated that his pain doctor will not prescribe any narcotics due to his concomitant use of benzodiazepines.  Aside from these details, he was unable to report any unmet needs which are prompting his repeat visits.  He simply said that he gets to the point where he feels like he cannot take the pain anymore, and he has to come to the ER.  The history is provided by the patient.  Headache Pain location:  L temporal Quality:  Sharp Radiates to:  Does not radiate Severity currently:  10/10 Severity at highest:  10/10 Onset quality:  Sudden Duration:  3 hours Timing:  Constant Progression:  Unchanged Chronicity:  Recurrent Similar to prior headaches: yes   Context comment:  No known trigger Relieved by:  Nothing Worsened by:  Nothing Ineffective treatments:  None tried Associated symptoms: abdominal pain (LUQ), dizziness, nausea, neck pain (chronic, midline) and vomiting   Associated symptoms: no back pain, no cough, no diarrhea, no ear  pain, no eye pain, no fever, no focal weakness, no numbness, no seizures, no sore throat, no tingling and no visual change   Dizziness Associated symptoms: headaches, nausea and vomiting   Associated symptoms: no chest pain, no diarrhea, no palpitations, no shortness of breath and no vision changes   Emesis Associated symptoms: abdominal pain (LUQ) and headaches   Associated symptoms: no arthralgias, no chills, no cough, no diarrhea, no fever and no sore throat       Past Medical History:  Diagnosis Date   Alcohol abuse, in remission    Anal fissure    Anemia    Ankylosing spondylitis (HCC)    Anxiety    Arthritis    Chronic diarrhea    Chronic headaches    Chronic pain    Colitis    Colon polyps    Depression    Esophagitis    Gastric polyps    hyperplastic and fundic gland   Gastritis    GERD (gastroesophageal reflux disease)    Hypertension    IBS (irritable bowel syndrome)    Poor dentition    SIADH (syndrome of inappropriate ADH production) ([place])     Patient Active Problem List   Diagnosis Date Noted   Family history of colon cancer 12/02/2018   Gastric polyps    Schatzki's ring    Left sided ulcerative colitis without complication ([place])  Odynophagia 11/25/2018   Dysphagia 11/25/2018   Major depressive disorder, 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/24/2018   IBS (irritable bowel syndrome)    Hypertension    Colitis    Adjustment disorder with mixed anxiety and depressed mood 05/11/2017   Alcohol abuse    Anxiety    Gastroesophageal reflux disease    Osteopenia determined by x-ray 08/06/2014   Stress fracture of calcaneus 08/06/2014   Vitamin D deficiency 12/18/2013   Dementia ([place]) 12/18/2013   Benign microscopic hematuria 07/06/2013   SIADH (syndrome of inappropriate ADH production) ([place]) 06/22/2013   Ankylosing spondylitis ([place]) 06/20/2013   Malnutrition of moderate degree ([place]) 06/20/2013   BPH (benign prostatic hyperplasia)  08/22/2010   History of colonic polyps 08/13/2008   Essential hypertension 07/03/2007   PUD (peptic ulcer disease) 07/03/2007    Past Surgical History:  Procedure Laterality Date   BIOPSY  11/26/2018   Procedure: BIOPSY;  Surgeon: Gatha Mayer, MD;  Location: [facility];  Service: Endoscopy;;   COLONOSCOPY W/ BIOPSIES     COLONOSCOPY WITH PROPOFOL N/A 11/26/2018   Procedure: COLONOSCOPY WITH PROPOFOL;  Surgeon: Gatha Mayer, MD;  Location: [facility];  Service: Endoscopy;  Laterality: N/A;   ESOPHAGOGASTRODUODENOSCOPY     ESOPHAGOGASTRODUODENOSCOPY (EGD) WITH PROPOFOL N/A 11/26/2018   Procedure: ESOPHAGOGASTRODUODENOSCOPY (EGD) WITH PROPOFOL;  Surgeon: Gatha Mayer, MD;  Location: [facility];  Service: Endoscopy;  Laterality: N/A;   HEMOSTASIS CLIP PLACEMENT  11/26/2018   Procedure: HEMOSTASIS CLIP PLACEMENT;  Surgeon: Gatha Mayer, MD;  Location: [facility];  Service: Endoscopy;;   HERNIA REPAIR Bilateral    HOT HEMOSTASIS N/A 11/26/2018   Procedure: HOT HEMOSTASIS (ARGON PLASMA COAGULATION/BICAP);  Surgeon: Gatha Mayer, MD;  Location: [facility];  Service: Endoscopy;  Laterality: N/A;   OPEN REDUCTION INTERNAL FIXATION (ORIF) DISTAL RADIAL FRACTURE Right 02/11/2018   Procedure: OPEN REDUCTION INTERNAL FIXATION (ORIF) DISTAL RADIAL FRACTURE;  Surgeon: Milly Jakob, MD;  Location: [facility];  Service: Orthopedics;  Laterality: Right;   POLYPECTOMY  11/26/2018   Procedure: POLYPECTOMY;  Surgeon: Gatha Mayer, MD;  Location: [facility];  Service: Endoscopy;;   TONSILLECTOMY         Family History  Problem Relation Age of Onset   Anxiety disorder Mother    Congestive Heart Failure Mother    Crohn's disease Mother    Colon cancer Mother 47   Arthritis Father    High blood pressure Father    Crohn's disease Father     Social History   Tobacco Use   Smoking status: Former    Types: Cigarettes    Quit date: 2014    Years since quitting: 8.6   Smokeless  tobacco: Never  Vaping Use   Vaping Use: Never used  Substance Use Topics   Alcohol use: Yes    Comment: 1 25 oz of beer a night   Drug use: Yes    Types: Marijuana    Comment: other night    Home Medications Prior to Admission medications   Medication Sig Start Date End Date Taking? Authorizing Provider  acetaminophen (TYLENOL) 325 MG tablet Take 2 tablets (650 mg total) by mouth every 6 (six) hours as needed for mild pain. (May buy over the counter) 06/27/18  Yes Connye Burkitt, NP  lisinopril (PRINIVIL,ZESTRIL) 10 MG tablet Take 1 tablet (10 mg total) by mouth daily. Patient taking differently: Take 10 mg by mouth at bedtime. 08/21/17 01/11/21 Yes Lacroce, Hulen Shouts, MD  LORazepam (ATIVAN) 1 MG tablet Take 1 mg by mouth every 8 (eight) hours.   Yes [provider]  Multiple Vitamin (MULTIVITAMIN WITH MINERALS) TABS tablet Take 1 tablet by mouth daily. (May buy over the counter) 06/28/18  Yes Connye Burkitt, NP  pantoprazole (PROTONIX) 40 MG tablet Take 1 tablet (40 mg total) by mouth daily. 04/24/14  Yes Janith Lima, MD  QUEtiapine (SEROQUEL) 300 MG tablet Take 300 mg by mouth at bedtime.    Yes [provider]  sucralfate (CARAFATE) 1 G tablet Take 1 tablet (1 g total) by mouth 4 (four) times daily -  with meals and at bedtime. Chew before swallowing Patient taking differently: Take 1 g by mouth 4 (four) times daily. Chew before swallowing 12/17/13  Yes Janith Lima, MD  verapamil (CALAN-SR) 240 MG CR tablet Take 240 mg by mouth at bedtime. 02/09/17  Yes [provider]  diclofenac Sodium (VOLTAREN) 1 % GEL Apply 2 g topically 4 (four) times daily as needed. 01/05/21   Quintella Reichert, MD  Eszopiclone 3 MG TABS Take 3 mg by mouth at bedtime. 08/24/20   [provider]  ondansetron (ZOFRAN ODT) 4 MG disintegrating tablet Take 1 tablet (4 mg total) by mouth every 8 (eight) hours as needed for nausea or vomiting. 01/05/21   Quintella Reichert, MD  sodium  chloride (OCEAN) 0.65 % SOLN nasal spray Place 2 sprays into both nostrils 4 (four) times daily as needed for congestion.     [provider]    Allergies    Diphenhydramine hcl, Flexeril [cyclobenzaprine], Hctz [hydrochlorothiazide], Nsaids, Gabapentin, and Hydroxyzine  Review of Systems   Review of Systems  Constitutional:  Negative for chills and fever.  HENT:  Negative for ear pain and sore throat.   Eyes:  Negative for pain and visual disturbance.  Respiratory:  Negative for cough and shortness of breath.   Cardiovascular:  Negative for chest pain and palpitations.  Gastrointestinal:  Positive for abdominal pain (LUQ), nausea and vomiting. Negative for diarrhea.  Genitourinary:  Negative for dysuria and hematuria.  Musculoskeletal:  Positive for neck pain (chronic, midline). Negative for arthralgias and back pain.  Skin:  Negative for color change and rash.  Neurological:  Positive for dizziness and headaches. Negative for focal weakness, seizures, syncope and numbness.  All other systems reviewed and are negative.  Physical Exam Updated Vital Signs BP (!) 165/83 (BP Location: Left Arm)   Pulse 92   Temp 98.9 F (37.2 C) (Oral)   Resp 16   Ht 5' 10"  (1.778 m)   Wt 72.6 kg   SpO2 100%   BMI 22.96 kg/m   Physical Exam Vitals and nursing note reviewed.  Constitutional:      Appearance: Normal appearance.  HENT:     Head: Normocephalic and atraumatic.     Mouth/Throat:     Mouth: Mucous membranes are moist.     Comments: edentulous Neck:     Comments: Mild midline cervical tenderness Cardiovascular:     Rate and Rhythm: Normal rate and regular rhythm.     Heart sounds: Normal heart sounds.  Pulmonary:     Effort: Pulmonary effort is normal.     Breath sounds: Normal breath sounds.  Abdominal:     General: There is no distension.     Tenderness: There is no abdominal tenderness. There is no guarding.  Musculoskeletal:     Cervical back: Normal range of  motion.     Right  lower leg: No edema.     Left lower leg: No edema.  Skin:    General: Skin is warm and dry.  Neurological:     General: No focal deficit present.     Mental Status: He is alert and oriented to person, place, and time.     Cranial Nerves: No cranial nerve deficit or dysarthria.     Sensory: No sensory deficit.     Motor: No weakness.     Coordination: Coordination normal.  Psychiatric:        Mood and Affect: Mood normal.        Behavior: Behavior normal.    ED Results / Procedures / Treatments   Labs (all labs ordered are listed, but only abnormal results are displayed) Labs Reviewed  COMPREHENSIVE METABOLIC PANEL - Abnormal; Notable for the following components:      Result Value   Sodium 133 (*)    Chloride 96 (*)    Glucose, Bld 118 (*)    BUN 7 (*)    All other components within normal limits  CBC - Abnormal; Notable for the following components:   RBC 4.21 (*)    HCT 38.9 (*)    All other components within normal limits  URINALYSIS, ROUTINE W REFLEX MICROSCOPIC - Abnormal; Notable for the following components:   Color, Urine COLORLESS (*)    All other components within normal limits  LIPASE, BLOOD  ETHANOL  MAGNESIUM  SEDIMENTATION RATE    EKG EKG Interpretation  Date/Time:  Tuesday January 11 2021 11:01:10 EDT Ventricular Rate:  76 PR Interval:  191 QRS Duration: 111 QT Interval:  399 QTC Calculation: 449 R Axis:   70 Text Interpretation: Sinus rhythm normal axis nonspecific T wave changes throughout No acute ischemia Confirmed by Lorre Munroe ([id number]) on 01/11/2021 11:04:40 AM  Radiology CT Head Wo Contrast  Result Date: 01/11/2021 CLINICAL DATA:  Headache, intracranial hemorrhage suspected. Additional history provided: Chronic headache, dizziness, nausea. EXAM: CT HEAD WITHOUT CONTRAST TECHNIQUE: Contiguous axial images were obtained from the base of the skull through the vertex without intravenous contrast. COMPARISON:  Prior head CT  examinations 11/24/2020 and earlier. FINDINGS: Brain: Mildly motion degraded exam. Cerebral volume is normal for age. There is no acute intracranial hemorrhage. No demarcated cortical infarct. No extra-axial fluid collection. No evidence of an intracranial mass. No midline shift. Vascular: No hyperdense vessel. Skull: Normal. Negative for fracture or focal lesion. Sinuses/Orbits: Visualized orbits show no acute finding. Moderate volume frothy secretions within the left sphenoid sinus. IMPRESSION: Mildly motion degraded exam. No evidence of acute intracranial abnormality. Left sphenoid sinusitis. Electronically Signed   By: Kellie Simmering DO   On: 01/11/2021 11:07    Procedures Procedures   Medications Ordered in ED Medications  fentaNYL (SUBLIMAZE) injection 50 mcg (has no administration in time range)  ondansetron (ZOFRAN) injection 4 mg (has no administration in time range)    ED Course  I have reviewed the triage vital signs and the nursing notes.  Pertinent labs & imaging results that were available during my care of the patient were reviewed by me and considered in my medical decision making (see chart for details).    MDM Rules/Calculators/A&P                           SUDAIS BANGHART presents with headache, neck pain, and anxiety.  These are all chronic symptoms for which she has utilized the ED multiple times  over the past few months.  He was evaluated for evidence of acute, emergent condition such as subarachnoid hemorrhage, intracranial bleed, electrolyte abnormalities, temporal arteritis.  ED work-up was within normal limits.  He was advised to follow with his primary care physician.  TOC consult placed for evaluation of outpatient needs given his frequent ER utilization. Final Clinical Impression(s) / ED Diagnoses Final diagnoses:  Chronic pain syndrome    Rx / DC Orders ED Discharge Orders     None        Arnaldo Natal, MD 01/11/21 1236

## 2021-01-11 NOTE — ED Notes (Signed)
Patient verbalizes understanding of discharge instructions. Opportunity for questioning and answers were provided. Patient discharged from ED and ride was given to pt with [facility].

## 2021-01-12 ENCOUNTER — Emergency Department (HOSPITAL_BASED_OUTPATIENT_CLINIC_OR_DEPARTMENT_OTHER)
Admission: EM | Admit: 2021-01-12 | Discharge: 2021-01-12 | Disposition: A | Discharge status: Home / Self Care | Payer: Medicaid Other | Attending: Emergency Medicine | Admitting: Emergency Medicine

## 2021-01-12 ENCOUNTER — Other Ambulatory Visit: Payer: Self-pay

## 2021-01-12 ENCOUNTER — Encounter (HOSPITAL_BASED_OUTPATIENT_CLINIC_OR_DEPARTMENT_OTHER): Payer: Self-pay | Admitting: Obstetrics and Gynecology

## 2021-01-12 DIAGNOSIS — Z5321 Procedure and treatment not carried out due to patient leaving prior to being seen by health care provider: Secondary | ICD-10-CM | POA: Diagnosis not present

## 2021-01-12 DIAGNOSIS — R42 Dizziness and giddiness: Secondary | ICD-10-CM | POA: Diagnosis present

## 2021-01-12 MED ORDER — ONDANSETRON 4 MG PO TBDP
4.0000 mg | ORAL_TABLET | Freq: Once | ORAL | Status: AC | PRN
  Administered 2021-01-12: 4 mg via ORAL
  Filled 2021-01-12: qty 1

## 2021-01-12 NOTE — ED Triage Notes (Signed)
Patient presents to the ER via EMS with dizziness. Patient seen frequently for same.

## 2021-01-12 NOTE — ED Notes (Signed)
Patient reports he wants to go to a nursing home. Patient has a case worker Selinda Eon) [phone]

## 2021-01-13 ENCOUNTER — Other Ambulatory Visit: Payer: Self-pay

## 2021-01-13 ENCOUNTER — Emergency Department (HOSPITAL_BASED_OUTPATIENT_CLINIC_OR_DEPARTMENT_OTHER): Payer: Medicaid Other

## 2021-01-13 ENCOUNTER — Encounter (HOSPITAL_BASED_OUTPATIENT_CLINIC_OR_DEPARTMENT_OTHER): Payer: Self-pay

## 2021-01-13 ENCOUNTER — Emergency Department (HOSPITAL_BASED_OUTPATIENT_CLINIC_OR_DEPARTMENT_OTHER)
Admission: EM | Admit: 2021-01-13 | Discharge: 2021-01-13 | Disposition: A | Discharge status: Home / Self Care | Payer: Medicaid Other | Attending: Emergency Medicine | Admitting: Emergency Medicine

## 2021-01-13 DIAGNOSIS — M542 Cervicalgia: Secondary | ICD-10-CM | POA: Diagnosis not present

## 2021-01-13 DIAGNOSIS — G8929 Other chronic pain: Secondary | ICD-10-CM | POA: Insufficient documentation

## 2021-01-13 DIAGNOSIS — F0390 Unspecified dementia without behavioral disturbance: Secondary | ICD-10-CM | POA: Diagnosis not present

## 2021-01-13 DIAGNOSIS — R519 Headache, unspecified: Secondary | ICD-10-CM | POA: Diagnosis present

## 2021-01-13 DIAGNOSIS — R42 Dizziness and giddiness: Secondary | ICD-10-CM | POA: Insufficient documentation

## 2021-01-13 DIAGNOSIS — Z79899 Other long term (current) drug therapy: Secondary | ICD-10-CM | POA: Insufficient documentation

## 2021-01-13 DIAGNOSIS — R112 Nausea with vomiting, unspecified: Secondary | ICD-10-CM | POA: Diagnosis not present

## 2021-01-13 DIAGNOSIS — Z87891 Personal history of nicotine dependence: Secondary | ICD-10-CM | POA: Insufficient documentation

## 2021-01-13 DIAGNOSIS — I10 Essential (primary) hypertension: Secondary | ICD-10-CM | POA: Insufficient documentation

## 2021-01-13 LAB — CBC WITH DIFFERENTIAL/PLATELET
Abs Immature Granulocytes: 0.02 10*3/uL (ref 0.00–0.07)
Basophils Absolute: 0.1 10*3/uL (ref 0.0–0.1)
Basophils Relative: 1 %
Eosinophils Absolute: 0 10*3/uL (ref 0.0–0.5)
Eosinophils Relative: 1 %
HCT: 37.7 % — ABNORMAL LOW (ref 39.0–52.0)
Hemoglobin: 13.1 g/dL (ref 13.0–17.0)
Immature Granulocytes: 0 %
Lymphocytes Relative: 17 %
Lymphs Abs: 1.4 10*3/uL (ref 0.7–4.0)
MCH: 31.7 pg (ref 26.0–34.0)
MCHC: 34.7 g/dL (ref 30.0–36.0)
MCV: 91.3 fL (ref 80.0–100.0)
Monocytes Absolute: 0.8 10*3/uL (ref 0.1–1.0)
Monocytes Relative: 11 %
Neutro Abs: 5.6 10*3/uL (ref 1.7–7.7)
Neutrophils Relative %: 70 %
Platelets: 291 10*3/uL (ref 150–400)
RBC: 4.13 MIL/uL — ABNORMAL LOW (ref 4.22–5.81)
RDW: 12.1 % (ref 11.5–15.5)
WBC: 7.9 10*3/uL (ref 4.0–10.5)
nRBC: 0 % (ref 0.0–0.2)

## 2021-01-13 LAB — BASIC METABOLIC PANEL
Anion gap: 10 (ref 5–15)
BUN: <5 mg/dL — ABNORMAL LOW (ref 8–23)
CO2: 27 mmol/L (ref 22–32)
Calcium: 9 mg/dL (ref 8.9–10.3)
Chloride: 96 mmol/L — ABNORMAL LOW (ref 98–111)
Creatinine, Ser: 0.66 mg/dL (ref 0.61–1.24)
GFR, Estimated: >60 mL/min (ref >60)
Glucose, Bld: 89 mg/dL (ref 70–99)
Potassium: 3.6 mmol/L (ref 3.5–5.1)
Sodium: 133 mmol/L — ABNORMAL LOW (ref 135–145)

## 2021-01-13 MED ORDER — METOCLOPRAMIDE HCL 5 MG/ML IJ SOLN
10.0000 mg | Freq: Once | INTRAMUSCULAR | Status: AC
  Administered 2021-01-13: 10 mg via INTRAVENOUS
  Filled 2021-01-13: qty 2

## 2021-01-13 MED ORDER — SODIUM CHLORIDE 0.9 % IV BOLUS
1000.0000 mL | Freq: Once | INTRAVENOUS | Status: AC
  Administered 2021-01-13: 1000 mL via INTRAVENOUS

## 2021-01-13 MED ORDER — IOHEXOL 350 MG/ML SOLN
75.0000 mL | Freq: Once | INTRAVENOUS | Status: AC | PRN
  Administered 2021-01-13: 75 mL via INTRAVENOUS

## 2021-01-13 MED ORDER — KETOROLAC TROMETHAMINE 15 MG/ML IJ SOLN
15.0000 mg | Freq: Once | INTRAMUSCULAR | Status: AC
  Administered 2021-01-13: 15 mg via INTRAVENOUS
  Filled 2021-01-13: qty 1

## 2021-01-13 MED ORDER — LORAZEPAM 1 MG PO TABS
1.0000 mg | ORAL_TABLET | Freq: Once | ORAL | Status: AC
  Administered 2021-01-13: 1 mg via ORAL
  Filled 2021-01-13: qty 1

## 2021-01-13 NOTE — ED Triage Notes (Addendum)
Patient BIB [facility] from Home with Nausea, Vomiting, Neck Pain, and Headache.   Patient states he deals with these symptoms regularly but has been more symptomatic over the past 2 days. Burping during Triage. NAD Noted during Triage.  Appointment with GI next Month. BIB Wheelchair, A&Ox4. GCS 15. No CP, SOB, or Diarrhea. No recent Trauma or Fall.

## 2021-01-13 NOTE — ED Notes (Signed)
Patient walking in and out of room.  Redirected patient to stay in room for his safety and the safety of other patients.  Patient agreeable but continues to come out of room.  RN notified.

## 2021-01-13 NOTE — ED Provider Notes (Signed)
[facility] EMERGENCY DEPT Provider Note   CSN: [id number] Arrival date & time: 01/13/21  0544     History Chief Complaint  Patient presents with   Nausea   Emesis   Headache   Neck Pain    Anthony Lamb is a 65 y.o. male.  HPI 65 year old male presents with headache and neck pain.  He is also having vomiting.  This time it started about 3 days ago.  It has gradually worsened ever since.  He is also dizzy/having vertigo.  He has been having these issues on and off for a while.  He has chronic neck pain and stiffness that he states is from ankylosing spondylitis.  Typically he has had to come to the emergency department and this is his 10th visit in the last 3 months for the same.  He does endorse some occasional spots in his vision.  The headache is like a pressure that goes from the front to the back.  Feels like he has sinus pressure.  He is also having severe neck pain.  No focal weakness or numbness if he feels diffusely weak and dehydrated.  He has not had a fever.  He has been having some reflux.  Pain is rated as severe.  He also states that the Ativan he was recently prescribed seem to disintegrate and is wondering if he is going through withdrawals because occasionally he will have some jerks in his legs.  Past Medical History:  Diagnosis Date   Alcohol abuse, in remission    Anal fissure    Anemia    Ankylosing spondylitis (HCC)    Anxiety    Arthritis    Chronic diarrhea    Chronic headaches    Chronic pain    Colitis    Colon polyps    Depression    Esophagitis    Gastric polyps    hyperplastic and fundic gland   Gastritis    GERD (gastroesophageal reflux disease)    Hypertension    IBS (irritable bowel syndrome)    Poor dentition    SIADH (syndrome of inappropriate ADH production) ([place])     Patient Active Problem List   Diagnosis Date Noted   Family history of colon cancer 12/02/2018   Gastric polyps    Schatzki's ring    Left sided  ulcerative colitis without complication (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   IBS (irritable bowel syndrome)    Hypertension    Colitis    Adjustment disorder with mixed anxiety and depressed mood 05/11/2017   Alcohol abuse    Anxiety    Gastroesophageal reflux disease    Osteopenia determined by x-ray 08/06/2014   Stress fracture of calcaneus 08/06/2014   Vitamin D deficiency 12/18/2013   Dementia ([place]) 12/18/2013   Benign microscopic hematuria 07/06/2013   SIADH (syndrome of inappropriate ADH production) ([place]) 06/22/2013   Ankylosing spondylitis ([place]) 06/20/2013   Malnutrition of moderate degree ([place]) 06/20/2013   BPH (benign prostatic hyperplasia) 08/22/2010   History of colonic polyps 08/13/2008   Essential hypertension 07/03/2007   PUD (peptic ulcer disease) 07/03/2007    Past Surgical History:  Procedure Laterality Date   BIOPSY  11/26/2018   Procedure: BIOPSY;  Surgeon: Gatha Mayer, MD;  Location: [facility];  Service: Endoscopy;;   COLONOSCOPY W/ BIOPSIES     COLONOSCOPY WITH PROPOFOL N/A 11/26/2018   Procedure:  COLONOSCOPY WITH PROPOFOL;  Surgeon: Gatha Mayer, MD;  Location: [facility];  Service: Endoscopy;  Laterality: N/A;   ESOPHAGOGASTRODUODENOSCOPY     ESOPHAGOGASTRODUODENOSCOPY (EGD) WITH PROPOFOL N/A 11/26/2018   Procedure: ESOPHAGOGASTRODUODENOSCOPY (EGD) WITH PROPOFOL;  Surgeon: Gatha Mayer, MD;  Location: [facility];  Service: Endoscopy;  Laterality: N/A;   HEMOSTASIS CLIP PLACEMENT  11/26/2018   Procedure: HEMOSTASIS CLIP PLACEMENT;  Surgeon: Gatha Mayer, MD;  Location: [facility];  Service: Endoscopy;;   HERNIA REPAIR Bilateral    HOT HEMOSTASIS N/A 11/26/2018   Procedure: HOT HEMOSTASIS (ARGON PLASMA COAGULATION/BICAP);  Surgeon: Gatha Mayer, MD;  Location: [facility];  Service: Endoscopy;  Laterality: N/A;   OPEN REDUCTION  INTERNAL FIXATION (ORIF) DISTAL RADIAL FRACTURE Right 02/11/2018   Procedure: OPEN REDUCTION INTERNAL FIXATION (ORIF) DISTAL RADIAL FRACTURE;  Surgeon: Milly Jakob, MD;  Location: [facility];  Service: Orthopedics;  Laterality: Right;   POLYPECTOMY  11/26/2018   Procedure: POLYPECTOMY;  Surgeon: Gatha Mayer, MD;  Location: [facility];  Service: Endoscopy;;   TONSILLECTOMY         Family History  Problem Relation Age of Onset   Anxiety disorder Mother    Congestive Heart Failure Mother    Crohn's disease Mother    Colon cancer Mother 24   Arthritis Father    High blood pressure Father    Crohn's disease Father     Social History   Tobacco Use   Smoking status: Former    Types: Cigarettes    Quit date: 2014    Years since quitting: 8.6   Smokeless tobacco: Never  Vaping Use   Vaping Use: Never used  Substance Use Topics   Alcohol use: Yes    Comment: 1 25 oz of beer a night   Drug use: Yes    Types: Marijuana    Comment: other night    Home Medications Prior to Admission medications   Medication Sig Start Date End Date Taking? Authorizing Provider  acetaminophen (TYLENOL) 325 MG tablet Take 2 tablets (650 mg total) by mouth every 6 (six) hours as needed for mild pain. (May buy over the counter) 06/27/18   Connye Burkitt, NP  diclofenac Sodium (VOLTAREN) 1 % GEL Apply 2 g topically 4 (four) times daily as needed. 01/05/21   Quintella Reichert, MD  Eszopiclone 3 MG TABS Take 3 mg by mouth at bedtime. 08/24/20   [provider]  lisinopril (PRINIVIL,ZESTRIL) 10 MG tablet Take 1 tablet (10 mg total) by mouth daily. Patient taking differently: Take 10 mg by mouth at bedtime. 08/21/17 01/11/21  Lacroce, Hulen Shouts, MD  LORazepam (ATIVAN) 1 MG tablet Take 1 mg by mouth every 8 (eight) hours.    [provider]  Multiple Vitamin (MULTIVITAMIN WITH MINERALS) TABS tablet Take 1 tablet by mouth daily. (May buy over the counter) 06/28/18   Connye Burkitt, NP  ondansetron  (ZOFRAN ODT) 4 MG disintegrating tablet Take 1 tablet (4 mg total) by mouth every 8 (eight) hours as needed for nausea or vomiting. 01/05/21   Quintella Reichert, MD  pantoprazole (PROTONIX) 40 MG tablet Take 1 tablet (40 mg total) by mouth daily. 04/24/14   Janith Lima, MD  QUEtiapine (SEROQUEL) 300 MG tablet Take 300 mg by mouth at bedtime.     [provider]  sodium chloride (OCEAN) 0.65 % SOLN nasal spray Place 2 sprays into both nostrils 4 (four) times daily as needed for congestion.     [provider]  sucralfate (CARAFATE) 1 G tablet Take 1 tablet (1 g total) by mouth 4 (four) times daily -  with meals and at bedtime. Chew before swallowing Patient taking differently: Take 1 g by mouth 4 (four) times daily. Chew before swallowing 12/17/13   Janith Lima, MD  verapamil (CALAN-SR) 240 MG CR tablet Take 240 mg by mouth at bedtime. 02/09/17   [provider]    Allergies    Diphenhydramine hcl, Flexeril [cyclobenzaprine], Hctz [hydrochlorothiazide], Nsaids, Gabapentin, and Hydroxyzine  Review of Systems   Review of Systems  Constitutional:  Negative for fever.  Respiratory:  Negative for shortness of breath.   Musculoskeletal:  Positive for neck pain and neck stiffness.  Neurological:  Positive for dizziness, weakness and headaches. Negative for numbness.  All other systems reviewed and are negative.  Physical Exam Updated Vital Signs BP 127/68   Pulse 74   Temp 97.8 F (36.6 C) (Oral)   Resp 19   Ht 5' 10"  (1.778 m)   Wt 72.6 kg   SpO2 96%   BMI 22.97 kg/m   Physical Exam Vitals and nursing note reviewed.  Constitutional:      General: He is not in acute distress.    Appearance: He is well-developed. He is not ill-appearing or diaphoretic.  HENT:     Head: Normocephalic and atraumatic.     Right Ear: External ear normal.     Left Ear: External ear normal.     Nose: Nose normal.  Eyes:     General:        Right eye: No discharge.         Left eye: No discharge.     Extraocular Movements: Extraocular movements intact.     Pupils: Pupils are equal, round, and reactive to light.  Cardiovascular:     Rate and Rhythm: Normal rate and regular rhythm.     Heart sounds: Normal heart sounds.  Pulmonary:     Effort: Pulmonary effort is normal.     Breath sounds: Normal breath sounds.  Abdominal:     Palpations: Abdomen is soft.     Tenderness: There is no abdominal tenderness.  Musculoskeletal:     Cervical back: Neck supple.  Skin:    General: Skin is warm and dry.  Neurological:     Mental Status: He is alert.     Comments: CN 3-12 grossly intact. 5/5 strength in all 4 extremities. Grossly normal sensation. Normal finger to nose.   Psychiatric:        Mood and Affect: Mood is not anxious.    ED Results / Procedures / Treatments   Labs (all labs ordered are listed, but only abnormal results are displayed) Labs Reviewed  BASIC METABOLIC PANEL - Abnormal; Notable for the following components:      Result Value   Sodium 133 (*)    Chloride 96 (*)    BUN <5 (*)    All other components within normal limits  CBC WITH DIFFERENTIAL/PLATELET - Abnormal; Notable for the following components:   RBC 4.13 (*)    HCT 37.7 (*)    All other components within normal limits    EKG EKG Interpretation  Date/Time:  Thursday January 13 2021 07:42:44 EDT Ventricular Rate:  66 PR Interval:  193 QRS Duration: 112 QT Interval:  453 QTC Calculation: 475 R Axis:   50 Text Interpretation: Sinus rhythm Borderline intraventricular conduction delay Borderline T wave abnormalities no significant change since 2  days ago Confirmed by Sherwood Gambler [phone]) on 01/13/2021 7:50:25 AM  Radiology CT Angio Head W or Wo Contrast  Result Date: 01/13/2021 CLINICAL DATA:  Stroke/TIA, assess intracranial and extracranial arteries; headache, neck pain EXAM: CT ANGIOGRAPHY HEAD AND NECK TECHNIQUE: Multidetector CT imaging of the head and neck was  performed using the standard protocol during bolus administration of intravenous contrast. Multiplanar CT image reconstructions and MIPs were obtained to evaluate the vascular anatomy. Carotid stenosis measurements (when applicable) are obtained utilizing NASCET criteria, using the distal internal carotid diameter as the denominator. CONTRAST:  64m OMNIPAQUE IOHEXOL 350 MG/ML SOLN COMPARISON:  CT head 11/24/2020 FINDINGS: CT HEAD Brain: There is no acute intracranial hemorrhage, mass effect, or edema. Gray-white differentiation is preserved. There is no extra-axial fluid collection. Ventricles and sulci are stable in size and configuration. Minimal patchy low-attenuation in the supratentorial white matter is nonspecific but may reflect minor chronic microvascular ischemic changes. Vascular: No hyperdense vessel. Skull: Cough Sinuses/Orbits: No acute finding. Other: None. Review of the MIP images confirms the above findings CTA NECK Aortic arch: Minor calcified plaque along the arch. Great vessel origins are patent. Right carotid system: Patent. Mild calcified plaque at the ICA origin without stenosis. Subsequent mixed plaque causing approximately 70% stenosis. Left carotid system: Patent. Mild calcified plaque at the ICA origin causing less than 50% stenosis. Vertebral arteries: Patent. Calcified plaque at the right vertebral origin causing mild stenosis. Skeleton: Appearance of included spine is consistent with history of ankylosing spondylitis. Other neck: Unremarkable. Upper chest: Included lungs are clear. Review of the MIP images confirms the above findings CTA HEAD Anterior circulation: Intracranial internal carotid arteries are patent. Anterior and middle cerebral arteries are patent. Posterior circulation: Intracranial vertebral arteries are patent. Basilar artery is patent. Major cerebellar artery origins are patent. Posterior cerebral arteries are patent. Venous sinuses: Patent as allowed by contrast  bolus timing. Review of the MIP images confirms the above findings IMPRESSION: No acute intracranial abnormality. Plaque along the proximal right ICA causes approximately 70% stenosis. Plaque along the left ICA origin causes less than 50% stenosis. No proximal intracranial vessel occlusion or significant stenosis. Electronically Signed   By: PMacy MisM.D.   On: 01/13/2021 10:14   CT Head Wo Contrast  Result Date: 01/11/2021 CLINICAL DATA:  Headache, intracranial hemorrhage suspected. Additional history provided: Chronic headache, dizziness, nausea. EXAM: CT HEAD WITHOUT CONTRAST TECHNIQUE: Contiguous axial images were obtained from the base of the skull through the vertex without intravenous contrast. COMPARISON:  Prior head CT examinations 11/24/2020 and earlier. FINDINGS: Brain: Mildly motion degraded exam. Cerebral volume is normal for age. There is no acute intracranial hemorrhage. No demarcated cortical infarct. No extra-axial fluid collection. No evidence of an intracranial mass. No midline shift. Vascular: No hyperdense vessel. Skull: Normal. Negative for fracture or focal lesion. Sinuses/Orbits: Visualized orbits show no acute finding. Moderate volume frothy secretions within the left sphenoid sinus. IMPRESSION: Mildly motion degraded exam. No evidence of acute intracranial abnormality. Left sphenoid sinusitis. Electronically Signed   By: KKellie SimmeringDO   On: 01/11/2021 11:07   CT Angio Neck W and/or Wo Contrast  Result Date: 01/13/2021 CLINICAL DATA:  Stroke/TIA, assess intracranial and extracranial arteries; headache, neck pain EXAM: CT ANGIOGRAPHY HEAD AND NECK TECHNIQUE: Multidetector CT imaging of the head and neck was performed using the standard protocol during bolus administration of intravenous contrast. Multiplanar CT image reconstructions and MIPs were obtained to evaluate the vascular anatomy. Carotid stenosis measurements (when applicable) are obtained  utilizing NASCET criteria,  using the distal internal carotid diameter as the denominator. CONTRAST:  38m OMNIPAQUE IOHEXOL 350 MG/ML SOLN COMPARISON:  CT head 11/24/2020 FINDINGS: CT HEAD Brain: There is no acute intracranial hemorrhage, mass effect, or edema. Gray-white differentiation is preserved. There is no extra-axial fluid collection. Ventricles and sulci are stable in size and configuration. Minimal patchy low-attenuation in the supratentorial white matter is nonspecific but may reflect minor chronic microvascular ischemic changes. Vascular: No hyperdense vessel. Skull: Cough Sinuses/Orbits: No acute finding. Other: None. Review of the MIP images confirms the above findings CTA NECK Aortic arch: Minor calcified plaque along the arch. Great vessel origins are patent. Right carotid system: Patent. Mild calcified plaque at the ICA origin without stenosis. Subsequent mixed plaque causing approximately 70% stenosis. Left carotid system: Patent. Mild calcified plaque at the ICA origin causing less than 50% stenosis. Vertebral arteries: Patent. Calcified plaque at the right vertebral origin causing mild stenosis. Skeleton: Appearance of included spine is consistent with history of ankylosing spondylitis. Other neck: Unremarkable. Upper chest: Included lungs are clear. Review of the MIP images confirms the above findings CTA HEAD Anterior circulation: Intracranial internal carotid arteries are patent. Anterior and middle cerebral arteries are patent. Posterior circulation: Intracranial vertebral arteries are patent. Basilar artery is patent. Major cerebellar artery origins are patent. Posterior cerebral arteries are patent. Venous sinuses: Patent as allowed by contrast bolus timing. Review of the MIP images confirms the above findings IMPRESSION: No acute intracranial abnormality. Plaque along the proximal right ICA causes approximately 70% stenosis. Plaque along the left ICA origin causes less than 50% stenosis. No proximal intracranial  vessel occlusion or significant stenosis. Electronically Signed   By: PMacy MisM.D.   On: 01/13/2021 10:14    Procedures Procedures   Medications Ordered in ED Medications  sodium chloride 0.9 % bolus 1,000 mL (1,000 mLs Intravenous New Bag/Given 01/13/21 0758)  metoCLOPramide (REGLAN) injection 10 mg (10 mg Intravenous Given 01/13/21 0758)  ketorolac (TORADOL) 15 MG/ML injection 15 mg (15 mg Intravenous Given 01/13/21 0759)  LORazepam (ATIVAN) tablet 1 mg (1 mg Oral Given 01/13/21 0800)  iohexol (OMNIPAQUE) 350 MG/ML injection 75 mL (75 mLs Intravenous Contrast Given 01/13/21 0910)    ED Course  I have reviewed the triage vital signs and the nursing notes.  Pertinent labs & imaging results that were available during my care of the patient were reviewed by me and considered in my medical decision making (see chart for details).    MDM Rules/Calculators/A&P                           Labs are unremarkable compared to baseline.  Patient is asking for something like fentanyl for pain as this is the only thing that has helped him in the past.  Chart review shows he has been getting fentanyl quite often when he comes.  I think that from a headache perspective this is not a great idea as narcotics tend to help in the moment but then make the headache come back/rebound headache.  I tried to discuss this with him as well.  Given his vertigo-like sensation that he has been having for some time associated with headache and neck pain I did a CTA head and neck which shows some 50-70% plaque but no dissection or complete obstruction.  He is not having symptomatic strokelike symptoms at this point and so I do not think it is emergent that he get the  carotid arteries taken care of but will need an outpatient referral.  He needs to follow-up with his PCP, who he states he is seeing tomorrow.  Overall this is likely a chronic issue.  May need outpatient MRI but no indication for emergent MRI right now. Final  Clinical Impression(s) / ED Diagnoses Final diagnoses:  Chronic nonintractable headache, unspecified headache type  Chronic neck pain    Rx / DC Orders ED Discharge Orders     None        Sherwood Gambler, MD 01/13/21 1030

## 2021-01-13 NOTE — ED Notes (Signed)
Patient verbalizes understanding of discharge instructions. Opportunity for questioning and answers were provided. Patient discharged from ED with safe transport.

## 2021-01-13 NOTE — Discharge Instructions (Addendum)
If you develop continued, recurrent, or worsening headache, fever, neck stiffness, vomiting, blurry or double vision, weakness or numbness in your arms or legs, trouble speaking, or any other new/concerning symptoms then return to the ER for evaluation.   You will need to talk to your primary care physician about your chronic headaches and neck pain.  You will need to review the plaque seen in the arteries and see if you need further outpatient management.

## 2021-01-27 ENCOUNTER — Emergency Department (HOSPITAL_BASED_OUTPATIENT_CLINIC_OR_DEPARTMENT_OTHER)
Admission: EM | Admit: 2021-01-27 | Discharge: 2021-01-27 | Disposition: A | Discharge status: Home / Self Care | Payer: Medicaid Other | Attending: Emergency Medicine | Admitting: Emergency Medicine

## 2021-01-27 ENCOUNTER — Encounter (HOSPITAL_BASED_OUTPATIENT_CLINIC_OR_DEPARTMENT_OTHER): Payer: Self-pay

## 2021-01-27 ENCOUNTER — Other Ambulatory Visit: Payer: Self-pay

## 2021-01-27 ENCOUNTER — Emergency Department (HOSPITAL_BASED_OUTPATIENT_CLINIC_OR_DEPARTMENT_OTHER): Payer: Medicaid Other | Admitting: Radiology

## 2021-01-27 ENCOUNTER — Other Ambulatory Visit (HOSPITAL_BASED_OUTPATIENT_CLINIC_OR_DEPARTMENT_OTHER): Payer: Self-pay

## 2021-01-27 ENCOUNTER — Emergency Department (HOSPITAL_BASED_OUTPATIENT_CLINIC_OR_DEPARTMENT_OTHER): Payer: Medicaid Other

## 2021-01-27 DIAGNOSIS — Z79899 Other long term (current) drug therapy: Secondary | ICD-10-CM | POA: Insufficient documentation

## 2021-01-27 DIAGNOSIS — R112 Nausea with vomiting, unspecified: Secondary | ICD-10-CM | POA: Diagnosis not present

## 2021-01-27 DIAGNOSIS — M542 Cervicalgia: Secondary | ICD-10-CM | POA: Diagnosis not present

## 2021-01-27 DIAGNOSIS — Z87891 Personal history of nicotine dependence: Secondary | ICD-10-CM | POA: Insufficient documentation

## 2021-01-27 DIAGNOSIS — J323 Chronic sphenoidal sinusitis: Secondary | ICD-10-CM | POA: Insufficient documentation

## 2021-01-27 DIAGNOSIS — R519 Headache, unspecified: Secondary | ICD-10-CM | POA: Diagnosis present

## 2021-01-27 DIAGNOSIS — J3489 Other specified disorders of nose and nasal sinuses: Secondary | ICD-10-CM

## 2021-01-27 DIAGNOSIS — R1013 Epigastric pain: Secondary | ICD-10-CM | POA: Diagnosis not present

## 2021-01-27 DIAGNOSIS — I10 Essential (primary) hypertension: Secondary | ICD-10-CM | POA: Insufficient documentation

## 2021-01-27 DIAGNOSIS — Z20822 Contact with and (suspected) exposure to covid-19: Secondary | ICD-10-CM | POA: Diagnosis not present

## 2021-01-27 DIAGNOSIS — G8929 Other chronic pain: Secondary | ICD-10-CM | POA: Insufficient documentation

## 2021-01-27 DIAGNOSIS — R072 Precordial pain: Secondary | ICD-10-CM | POA: Diagnosis not present

## 2021-01-27 LAB — CBC WITH DIFFERENTIAL/PLATELET
Abs Immature Granulocytes: 0.01 10*3/uL (ref 0.00–0.07)
Basophils Absolute: 0 10*3/uL (ref 0.0–0.1)
Basophils Relative: 1 %
Eosinophils Absolute: 0.1 10*3/uL (ref 0.0–0.5)
Eosinophils Relative: 1 %
HCT: 39 % (ref 39.0–52.0)
Hemoglobin: 13.6 g/dL (ref 13.0–17.0)
Immature Granulocytes: 0 %
Lymphocytes Relative: 22 %
Lymphs Abs: 1.3 10*3/uL (ref 0.7–4.0)
MCH: 31.3 pg (ref 26.0–34.0)
MCHC: 34.9 g/dL (ref 30.0–36.0)
MCV: 89.9 fL (ref 80.0–100.0)
Monocytes Absolute: 0.5 10*3/uL (ref 0.1–1.0)
Monocytes Relative: 9 %
Neutro Abs: 3.8 10*3/uL (ref 1.7–7.7)
Neutrophils Relative %: 67 %
Platelets: 286 10*3/uL (ref 150–400)
RBC: 4.34 MIL/uL (ref 4.22–5.81)
RDW: 12 % (ref 11.5–15.5)
WBC: 5.7 10*3/uL (ref 4.0–10.5)
nRBC: 0 % (ref 0.0–0.2)

## 2021-01-27 LAB — RESP PANEL BY RT-PCR (FLU A&B, COVID) ARPGX2
Influenza A by PCR: NEGATIVE
Influenza B by PCR: NEGATIVE
SARS Coronavirus 2 by RT PCR: NEGATIVE

## 2021-01-27 LAB — COMPREHENSIVE METABOLIC PANEL
ALT: 8 U/L (ref 0–44)
AST: 14 U/L — ABNORMAL LOW (ref 15–41)
Albumin: 4.1 g/dL (ref 3.5–5.0)
Alkaline Phosphatase: 60 U/L (ref 38–126)
Anion gap: 10 (ref 5–15)
BUN: <5 mg/dL — ABNORMAL LOW (ref 8–23)
CO2: 26 mmol/L (ref 22–32)
Calcium: 9 mg/dL (ref 8.9–10.3)
Chloride: 97 mmol/L — ABNORMAL LOW (ref 98–111)
Creatinine, Ser: 0.57 mg/dL — ABNORMAL LOW (ref 0.61–1.24)
GFR, Estimated: >60 mL/min (ref >60)
Glucose, Bld: 103 mg/dL — ABNORMAL HIGH (ref 70–99)
Potassium: 3.6 mmol/L (ref 3.5–5.1)
Sodium: 133 mmol/L — ABNORMAL LOW (ref 135–145)
Total Bilirubin: 0.6 mg/dL (ref 0.3–1.2)
Total Protein: 7 g/dL (ref 6.5–8.1)

## 2021-01-27 LAB — TROPONIN I (HIGH SENSITIVITY): Troponin I (High Sensitivity): 3 ng/L (ref <18)

## 2021-01-27 LAB — LIPASE, BLOOD: Lipase: 22 U/L (ref 11–51)

## 2021-01-27 MED ORDER — ONDANSETRON HCL 4 MG/2ML IJ SOLN
4.0000 mg | Freq: Once | INTRAMUSCULAR | Status: AC
  Administered 2021-01-27: 4 mg via INTRAVENOUS
  Filled 2021-01-27: qty 2

## 2021-01-27 MED ORDER — AMOXICILLIN-POT CLAVULANATE 875-125 MG PO TABS
1.0000 | ORAL_TABLET | Freq: Two times a day (BID) | ORAL | 0 refills | Status: DC
  Filled 2021-01-27: qty 14, 7d supply, fill #0

## 2021-01-27 MED ORDER — LACTATED RINGERS IV BOLUS
1000.0000 mL | Freq: Once | INTRAVENOUS | Status: AC
  Administered 2021-01-27: 1000 mL via INTRAVENOUS

## 2021-01-27 MED ORDER — PROCHLORPERAZINE EDISYLATE 10 MG/2ML IJ SOLN
10.0000 mg | Freq: Once | INTRAMUSCULAR | Status: AC
  Administered 2021-01-27: 10 mg via INTRAVENOUS
  Filled 2021-01-27: qty 2

## 2021-01-27 NOTE — ED Triage Notes (Signed)
Patient arrives via EMS from home with c/o n/v/d, headache, dehydration.

## 2021-01-27 NOTE — ED Notes (Signed)
Patient transported to CT 

## 2021-01-27 NOTE — ED Notes (Signed)
Sprite and graham crackers given. Pt void x2 in urinal for total of 600 cc clear yellow urine.

## 2021-01-27 NOTE — ED Notes (Signed)
Pt 's prescription filled and given to patient. Pt dc home with dc instructions. Pt states he understands the dc instructions and prescriptions. Pt dc home via [facility].

## 2021-01-27 NOTE — ED Provider Notes (Addendum)
[facility] EMERGENCY DEPT Provider Note   CSN: [id number] Arrival date & time: 01/27/21  5456     History Chief Complaint  Patient presents with   Nausea   Abdominal Pain   Neck Pain    Anthony Lamb is a 65 y.o. male.   Headache Pain location:  Frontal Quality:  Sharp Radiates to:  Face Onset quality:  Gradual Duration:  3 days Timing:  Constant Progression:  Unchanged Chronicity:  Chronic Similar to prior headaches: yes   Relieved by:  Acetaminophen Ineffective treatments:  Acetaminophen Associated symptoms: abdominal pain, congestion, diarrhea, drainage, nausea, neck pain, neck stiffness, sinus pressure and vomiting   Associated symptoms: no blurred vision, no cough, no fever, no numbness and no visual change    65 year old male with a history of ankylosing spondylitis, chronic neck pain and headaches, presenting to the emergency department for headache, chronic neck pain, nausea and vomiting.  The patient states that he has had a headache for weeks.  This is his ninth visit since June 2022 for similar complaints.  He is concerned for dehydration because for the past 24 hours he has been able to keep anything down.  He was previously diagnosed with acute frontal sinusitis on 11/24/2020 and given Augmentin for antibiotics as well as Zofran ODT.  On a repeat visit on 12/18/2020, he was status post a course of Augmentin and reported no relief with those medications.  He had a CT scan of the head which was unremarkable aside for some frothy secretions in his sphenoid sinus in June 2022.   He continues to endorse mucus discharge from his sinuses in addition to a frontal headache that is described as located in a bandlike sensation along his forehead from both temples to the front of his forehead.  He denies any vision changes.  He endorses chronic neck pain which she has previously been worked up with a CTA of the head and neck which ultimately resulted on 01/13/2021  positive with 50 to 70% plaque but no dissection or complete obstruction.  He was not having strokelike symptoms at that time.  He was referred to outpatient for continued management.  Additionally, yesterday he endorsed some chest discomfort described as substernal, radiating to his left chest that was initially a dull sensation but subsequently became sharp.  He was unable to state if it was pleuritic.  It lasted for around 45 minutes and then resolved.  He currently denies any chest pain.  He denies any fevers or chills.  He reiterates that his primary problem is of headache, nasal congestion and sinus pressure with associated nausea and vomiting and a concern for being dehydrated in the setting of an inability to tolerate p.o. intake.  He requests IV fluids, his home IV Ativan and pain medication.  He states that he has a history of peptic ulcers and cannot take NSAIDs.  Given his history of ankylosing spondylitis, he likely has a history of chronic NSAID use.  Past Medical History:  Diagnosis Date   Alcohol abuse, in remission    Anal fissure    Anemia    Ankylosing spondylitis (HCC)    Anxiety    Arthritis    Chronic diarrhea    Chronic headaches    Chronic pain    Colitis    Colon polyps    Depression    Esophagitis    Gastric polyps    hyperplastic and fundic gland   Gastritis    GERD (gastroesophageal reflux disease)  Hypertension    IBS (irritable bowel syndrome)    Poor dentition    SIADH (syndrome of inappropriate ADH production) ([place])     Patient Active Problem List   Diagnosis Date Noted   Family history of colon cancer 12/02/2018   Gastric polyps    Schatzki's ring    Left sided ulcerative colitis without complication ([place])    Odynophagia 11/25/2018   Dysphagia 11/25/2018   Major depressive disorder, 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   IBS (irritable bowel syndrome)    Hypertension    Colitis    Adjustment disorder with mixed  anxiety and depressed mood 05/11/2017   Alcohol abuse    Anxiety    Gastroesophageal reflux disease    Osteopenia determined by x-ray 08/06/2014   Stress fracture of calcaneus 08/06/2014   Vitamin D deficiency 12/18/2013   Dementia ([place]) 12/18/2013   Benign microscopic hematuria 07/06/2013   SIADH (syndrome of inappropriate ADH production) ([place]) 06/22/2013   Ankylosing spondylitis ([place]) 06/20/2013   Malnutrition of moderate degree ([place]) 06/20/2013   BPH (benign prostatic hyperplasia) 08/22/2010   History of colonic polyps 08/13/2008   Essential hypertension 07/03/2007   PUD (peptic ulcer disease) 07/03/2007    Past Surgical History:  Procedure Laterality Date   BIOPSY  11/26/2018   Procedure: BIOPSY;  Surgeon: Gatha Mayer, MD;  Location: [facility];  Service: Endoscopy;;   COLONOSCOPY W/ BIOPSIES     COLONOSCOPY WITH PROPOFOL N/A 11/26/2018   Procedure: COLONOSCOPY WITH PROPOFOL;  Surgeon: Gatha Mayer, MD;  Location: [facility];  Service: Endoscopy;  Laterality: N/A;   ESOPHAGOGASTRODUODENOSCOPY     ESOPHAGOGASTRODUODENOSCOPY (EGD) WITH PROPOFOL N/A 11/26/2018   Procedure: ESOPHAGOGASTRODUODENOSCOPY (EGD) WITH PROPOFOL;  Surgeon: Gatha Mayer, MD;  Location: [facility];  Service: Endoscopy;  Laterality: N/A;   HEMOSTASIS CLIP PLACEMENT  11/26/2018   Procedure: HEMOSTASIS CLIP PLACEMENT;  Surgeon: Gatha Mayer, MD;  Location: [facility];  Service: Endoscopy;;   HERNIA REPAIR Bilateral    HOT HEMOSTASIS N/A 11/26/2018   Procedure: HOT HEMOSTASIS (ARGON PLASMA COAGULATION/BICAP);  Surgeon: Gatha Mayer, MD;  Location: [facility];  Service: Endoscopy;  Laterality: N/A;   OPEN REDUCTION INTERNAL FIXATION (ORIF) DISTAL RADIAL FRACTURE Right 02/11/2018   Procedure: OPEN REDUCTION INTERNAL FIXATION (ORIF) DISTAL RADIAL FRACTURE;  Surgeon: Milly Jakob, MD;  Location: [facility];  Service: Orthopedics;  Laterality: Right;   POLYPECTOMY  11/26/2018   Procedure: POLYPECTOMY;   Surgeon: Gatha Mayer, MD;  Location: [facility];  Service: Endoscopy;;   TONSILLECTOMY         Family History  Problem Relation Age of Onset   Anxiety disorder Mother    Congestive Heart Failure Mother    Crohn's disease Mother    Colon cancer Mother 83   Arthritis Father    High blood pressure Father    Crohn's disease Father     Social History   Tobacco Use   Smoking status: Former    Types: Cigarettes    Quit date: 2014    Years since quitting: 8.6   Smokeless tobacco: Never  Vaping Use   Vaping Use: Never used  Substance Use Topics   Alcohol use: Yes    Comment: 1 25 oz of beer a night   Drug use: Yes    Types: Marijuana    Comment: other night    Home Medications Prior to Admission medications   Medication Sig Start Date End Date Taking? Authorizing Provider  amoxicillin-clavulanate (  AUGMENTIN) 875-125 MG tablet Take 1 tablet by mouth every 12 (twelve) hours. 01/27/21  Yes Regan Lemming, MD  acetaminophen (TYLENOL) 325 MG tablet Take 2 tablets (650 mg total) by mouth every 6 (six) hours as needed for mild pain. (May buy over the counter) 06/27/18   Connye Burkitt, NP  diclofenac Sodium (VOLTAREN) 1 % GEL Apply 2 g topically 4 (four) times daily as needed. 01/05/21   Quintella Reichert, MD  Eszopiclone 3 MG TABS Take 3 mg by mouth at bedtime. 08/24/20   [provider]  lisinopril (PRINIVIL,ZESTRIL) 10 MG tablet Take 1 tablet (10 mg total) by mouth daily. Patient taking differently: Take 10 mg by mouth at bedtime. 08/21/17 01/11/21  Lacroce, Hulen Shouts, MD  LORazepam (ATIVAN) 1 MG tablet Take 1 mg by mouth every 8 (eight) hours.    [provider]  Multiple Vitamin (MULTIVITAMIN WITH MINERALS) TABS tablet Take 1 tablet by mouth daily. (May buy over the counter) 06/28/18   Connye Burkitt, NP  ondansetron (ZOFRAN ODT) 4 MG disintegrating tablet Take 1 tablet (4 mg total) by mouth every 8 (eight) hours as needed for nausea or vomiting. 01/05/21   Quintella Reichert, MD  pantoprazole (PROTONIX) 40 MG tablet Take 1 tablet (40 mg total) by mouth daily. 04/24/14   Janith Lima, MD  QUEtiapine (SEROQUEL) 300 MG tablet Take 300 mg by mouth at bedtime.     [provider]  sodium chloride (OCEAN) 0.65 % SOLN nasal spray Place 2 sprays into both nostrils 4 (four) times daily as needed for congestion.     [provider]  sucralfate (CARAFATE) 1 G tablet Take 1 tablet (1 g total) by mouth 4 (four) times daily -  with meals and at bedtime. Chew before swallowing Patient taking differently: Take 1 g by mouth 4 (four) times daily. Chew before swallowing 12/17/13   Janith Lima, MD  verapamil (CALAN-SR) 240 MG CR tablet Take 240 mg by mouth at bedtime. 02/09/17   [provider]    Allergies    Diphenhydramine hcl, Flexeril [cyclobenzaprine], Hctz [hydrochlorothiazide], Nsaids, Gabapentin, and Hydroxyzine  Review of Systems   Review of Systems  Constitutional:  Negative for chills and fever.  HENT:  Positive for congestion, postnasal drip and sinus pressure.   Eyes:  Negative for blurred vision.  Respiratory:  Negative for cough.   Cardiovascular:  Positive for chest pain.  Gastrointestinal:  Positive for abdominal pain, diarrhea, nausea and vomiting.  Musculoskeletal:  Positive for neck pain and neck stiffness.  Neurological:  Positive for headaches. Negative for numbness.  All other systems reviewed and are negative.  Physical Exam Updated Vital Signs BP (!) 166/91   Pulse 73   Temp 98.6 F (37 C) (Oral)   Resp 19   Ht 5' 10"  (1.778 m)   Wt 72.6 kg   SpO2 100%   BMI 22.97 kg/m   Physical Exam Vitals and nursing note reviewed.  Constitutional:      Appearance: He is well-developed.  HENT:     Head: Normocephalic and atraumatic.     Nose:     Right Sinus: Frontal sinus tenderness present. No maxillary sinus tenderness.     Left Sinus: Frontal sinus tenderness present. No maxillary sinus tenderness.  Eyes:      Conjunctiva/sclera: Conjunctivae normal.  Cardiovascular:     Rate and Rhythm: Normal rate and regular rhythm.     Heart sounds: No murmur heard. Pulmonary:  Effort: Pulmonary effort is normal. No respiratory distress.     Breath sounds: Normal breath sounds.  Abdominal:     Palpations: Abdomen is soft.     Tenderness: There is abdominal tenderness in the epigastric area. There is no guarding or rebound.     Comments: Mild epigastric tenderness palpation with no rebound or guarding.  Musculoskeletal:     Cervical back: Neck supple.  Skin:    General: Skin is warm and dry.  Neurological:     General: No focal deficit present.     Mental Status: He is alert and oriented to person, place, and time. Mental status is at baseline.     Cranial Nerves: No cranial nerve deficit.     Sensory: No sensory deficit.     Motor: No weakness.    ED Results / Procedures / Treatments   Labs (all labs ordered are listed, but only abnormal results are displayed) Labs Reviewed  COMPREHENSIVE METABOLIC PANEL - Abnormal; Notable for the following components:      Result Value   Sodium 133 (*)    Chloride 97 (*)    Glucose, Bld 103 (*)    BUN <5 (*)    Creatinine, Ser 0.57 (*)    AST 14 (*)    All other components within normal limits  RESP PANEL BY RT-PCR (FLU A&B, COVID) ARPGX2  CBC WITH DIFFERENTIAL/PLATELET  LIPASE, BLOOD  TROPONIN I (HIGH SENSITIVITY)    EKG EKG Interpretation  Date/Time:  Thursday January 27 2021 08:08:14 EDT Ventricular Rate:  68 PR Interval:  185 QRS Duration: 115 QT Interval:  435 QTC Calculation: 463 R Axis:   79 Text Interpretation: Sinus rhythm Nonspecific intraventricular conduction delay No STEMI No significant change since last tracing Confirmed by Regan Lemming ([id number]) on 01/27/2021 8:17:34 AM  Radiology DG Chest 2 View  Result Date: 01/27/2021 CLINICAL DATA:  65 year old male with chest pain. Abdominal, neck pain. Vomiting. EXAM: CHEST - 2 VIEW  COMPARISON:  Chest radiographs 11/24/2020 and earlier. FINDINGS: Lung volumes and mediastinal contours remain within normal limits. Both lungs appear clear. No pneumothorax or pleural effusion. Visualized tracheal air column is within normal limits. Negative visible bowel gas pattern. No pneumoperitoneum. Spinal ankylosis. IMPRESSION: No cardiopulmonary abnormality and negative visible bowel gas pattern. Ankylosing Spondylitis. Electronically Signed   By: Genevie Ann M.D.   On: 01/27/2021 07:58   CT Maxillofacial Wo Contrast  Result Date: 01/27/2021 CLINICAL DATA:  65 year old male with recent headache. Left sphenoid sinus disease on head CT earlier this month. EXAM: CT MAXILLOFACIAL WITHOUT CONTRAST TECHNIQUE: Multidetector CT imaging of the maxillofacial structures was performed. Multiplanar CT image reconstructions were also generated. COMPARISON:  CTA head and neck 01/13/2021 and earlier. FINDINGS: Osseous: Absent dentition. Chronic upper cervical spinal ankylosis. No acute or suspicious osseous lesion identified. Orbits: Intact orbital walls. Orbits soft tissues appears symmetric and negative. Sinuses: Bilateral frontal sinuses, ethmoid air cells and maxillary sinuses are clear. The right sphenoid sinus remains clear. Bubbly opacity and moderate opacification of the left sphenoid sinus is stable from head CT 11/24/2020, mildly progressed compared to November last year. No complicating features. Tympanic cavities and mastoids remain clear. Soft tissues: Negative visible noncontrast larynx, pharynx, parapharyngeal spaces, retropharyngeal space, sublingual space, submandibular glands, parotid glands, masticator spaces. Limited intracranial: Stable. IMPRESSION: 1. Isolated paranasal inflammation in the left sphenoid sinus, stable since June and only mildly progressed compared to November 2021. 2. Absent dentition. No other acute finding in the noncontrast Face. Electronically Signed  By: Genevie Ann M.D.   On:  01/27/2021 08:08    Procedures Procedures   Medications Ordered in ED Medications  ondansetron [facility]) injection 4 mg (4 mg Intravenous Given 01/27/21 0808)  lactated ringers bolus 1,000 mL (1,000 mLs Intravenous New Bag/Given 01/27/21 0736)  prochlorperazine (COMPAZINE) injection 10 mg (10 mg Intravenous Given 01/27/21 6283)    ED Course  I have reviewed the triage vital signs and the nursing notes.  Pertinent labs & imaging results that were available during my care of the patient were reviewed by me and considered in my medical decision making (see chart for details).    MDM Rules/Calculators/A&P                           65 year old male presenting to the emergency department with multiple chronic complaints.  He complains of chronic symptoms of sinusitis with sinus pain and pressure, frontal headache, some mucopurulent discharge.  He has previously been prescribed Augmentin for suspected bacterial sinusitis without improvement in his symptoms.  He has had CT imaging in the past that is demonstrated frothy secretions in the sphenoid sinuses.  He endorses persistent headache, nausea and vomiting.  He also endorses diarrhea.  He has not recently tested for COVID-19.  He has not recently been on any antibiotics.  He has a history of ankylosing spondylitis and complains of chronic neck pain.  He has previously been evaluated for his headache and neck pain with CTA imaging of the head and neck which was negative for acute abnormality, did show 50 to 70% stenosis of the left and right carotids, mild stenosis of the vertebral arteries.  Given the patient's symptoms consistent with possible chronic sinusitis, will evaluate further for bacterial sinusitis with CT face imaging given the chronicity of the patient's symptoms with associated nausea and vomiting.  I have low suspicion for acute CVA, carotid or vertebral artery dissection or worsening stenosis given his recent work-up.  Low suspicion for  meningitis/encephalitis.  The patient does not endorse symptoms consistent with migraine headache.  No temporal arterial tenderness.   The patient has chronic pain to include chronic neck pain, chronic headaches, chronic diarrhea.  He has a history of esophagitis and gastritis, PUD, GERD, IBS.  He is an alcohol user and last drink was 2 days ago.  No signs of alcohol withdrawal at this time.  His symptoms may be consistent with pancreatitis, gastritis, peptic ulcer disease.  Overall abdominal exam not concerning for perforated ulcer or other acute surgical emergency.Given his nausea, vomiting, diarrhea, considered viral gastroenteritis or COVID-19.  Will evaluate with COVID-19 PCR.  Patient has mild epigastric tenderness, will send screening labs and a lipase.  I do not think CT imaging of the patient's abdomen is warranted at this time given the patient's overall reassuring abdominal exam with no rebound or guarding, mild epigastric tenderness in a patient with a history of gastritis and peptic ulcers from chronic NSAID use.   Plan to treat the patient's symptoms and reassess with plan for discharge home with likely outpatient follow-up and return precautions.  CT of the face was performed with findings concerning for isolated paranasal inflammation in the left sphenoid sinus, stable since June and only mildly progressed compared to November 2021.  The bilateral frontal sinuses, ethmoid air cells and maxillary sinuses are clear.  The right sphenoid sinus remains clear.  Bubbly opacity and moderate opacification of left sphenoid sinus, stable from CT prior.  No complicating features.    CBC without a leukocytosis, anemia or platelet abnormality.  BMP grossly unremarkable, hyponatremia 133. EKG without ST segment changes.  Patient is mildly hypertensive BP 166/91 but at this time is overall asymptomatic.  No vision changes.  No chest pain or shortness of breath.  Headache has improved with symptomatic  management.  The patient has been appropriately medically screened and/or stabilized in the ED. I have low suspicion for any other emergent medical condition which would require further screening, evaluation or treatment in the ED or require inpatient management.  We will attempt a course of Augmentin and have the patient follow-up with his PCP.  Final Clinical Impression(s) / ED Diagnoses Final diagnoses:  Chronic nonintractable headache, unspecified headache type  Sinus pressure  Chronic sphenoidal sinusitis  Hypertension, unspecified type    Rx / DC Orders ED Discharge Orders          Ordered    amoxicillin-clavulanate (AUGMENTIN) 875-125 MG tablet  Every 12 hours        01/27/21 0917             Regan Lemming, MD 01/27/21 7106    Regan Lemming, MD 01/27/21 [phone]

## 2021-02-07 ENCOUNTER — Emergency Department (HOSPITAL_BASED_OUTPATIENT_CLINIC_OR_DEPARTMENT_OTHER)
Admission: EM | Admit: 2021-02-07 | Discharge: 2021-02-07 | Disposition: A | Discharge status: Home / Self Care | Payer: Medicaid Other | Attending: Emergency Medicine | Admitting: Emergency Medicine

## 2021-02-07 ENCOUNTER — Encounter (HOSPITAL_BASED_OUTPATIENT_CLINIC_OR_DEPARTMENT_OTHER): Payer: Self-pay | Admitting: Emergency Medicine

## 2021-02-07 ENCOUNTER — Other Ambulatory Visit: Payer: Self-pay

## 2021-02-07 DIAGNOSIS — F0390 Unspecified dementia without behavioral disturbance: Secondary | ICD-10-CM | POA: Insufficient documentation

## 2021-02-07 DIAGNOSIS — I10 Essential (primary) hypertension: Secondary | ICD-10-CM | POA: Insufficient documentation

## 2021-02-07 DIAGNOSIS — Z79899 Other long term (current) drug therapy: Secondary | ICD-10-CM | POA: Insufficient documentation

## 2021-02-07 DIAGNOSIS — K2950 Unspecified chronic gastritis without bleeding: Secondary | ICD-10-CM | POA: Insufficient documentation

## 2021-02-07 DIAGNOSIS — R251 Tremor, unspecified: Secondary | ICD-10-CM | POA: Diagnosis not present

## 2021-02-07 DIAGNOSIS — F419 Anxiety disorder, unspecified: Secondary | ICD-10-CM | POA: Diagnosis present

## 2021-02-07 DIAGNOSIS — M791 Myalgia, unspecified site: Secondary | ICD-10-CM | POA: Diagnosis not present

## 2021-02-07 DIAGNOSIS — Z87891 Personal history of nicotine dependence: Secondary | ICD-10-CM | POA: Insufficient documentation

## 2021-02-07 DIAGNOSIS — Z20822 Contact with and (suspected) exposure to covid-19: Secondary | ICD-10-CM | POA: Diagnosis not present

## 2021-02-07 LAB — COMPREHENSIVE METABOLIC PANEL
ALT: 10 U/L (ref 0–44)
AST: 14 U/L — ABNORMAL LOW (ref 15–41)
Albumin: 4.4 g/dL (ref 3.5–5.0)
Alkaline Phosphatase: 65 U/L (ref 38–126)
Anion gap: 11 (ref 5–15)
BUN: <5 mg/dL — ABNORMAL LOW (ref 8–23)
CO2: 25 mmol/L (ref 22–32)
Calcium: 9.3 mg/dL (ref 8.9–10.3)
Chloride: 99 mmol/L (ref 98–111)
Creatinine, Ser: 0.68 mg/dL (ref 0.61–1.24)
GFR, Estimated: >60 mL/min (ref >60)
Glucose, Bld: 99 mg/dL (ref 70–99)
Potassium: 3.7 mmol/L (ref 3.5–5.1)
Sodium: 135 mmol/L (ref 135–145)
Total Bilirubin: 0.5 mg/dL (ref 0.3–1.2)
Total Protein: 7 g/dL (ref 6.5–8.1)

## 2021-02-07 LAB — CBC WITH DIFFERENTIAL/PLATELET
Abs Immature Granulocytes: 0.02 10*3/uL (ref 0.00–0.07)
Basophils Absolute: 0.1 10*3/uL (ref 0.0–0.1)
Basophils Relative: 1 %
Eosinophils Absolute: 0 10*3/uL (ref 0.0–0.5)
Eosinophils Relative: 1 %
HCT: 40.6 % (ref 39.0–52.0)
Hemoglobin: 13.8 g/dL (ref 13.0–17.0)
Immature Granulocytes: 0 %
Lymphocytes Relative: 13 %
Lymphs Abs: 1.1 10*3/uL (ref 0.7–4.0)
MCH: 31.5 pg (ref 26.0–34.0)
MCHC: 34 g/dL (ref 30.0–36.0)
MCV: 92.7 fL (ref 80.0–100.0)
Monocytes Absolute: 0.8 10*3/uL (ref 0.1–1.0)
Monocytes Relative: 9 %
Neutro Abs: 6.6 10*3/uL (ref 1.7–7.7)
Neutrophils Relative %: 76 %
Platelets: 209 10*3/uL (ref 150–400)
RBC: 4.38 MIL/uL (ref 4.22–5.81)
RDW: 12.3 % (ref 11.5–15.5)
WBC: 8.6 10*3/uL (ref 4.0–10.5)
nRBC: 0 % (ref 0.0–0.2)

## 2021-02-07 LAB — RESP PANEL BY RT-PCR (FLU A&B, COVID) ARPGX2
Influenza A by PCR: NEGATIVE
Influenza B by PCR: NEGATIVE
SARS Coronavirus 2 by RT PCR: NEGATIVE

## 2021-02-07 LAB — URINALYSIS, ROUTINE W REFLEX MICROSCOPIC
Bilirubin Urine: NEGATIVE
Glucose, UA: NEGATIVE mg/dL
Hgb urine dipstick: NEGATIVE
Ketones, ur: NEGATIVE mg/dL
Leukocytes,Ua: NEGATIVE
Nitrite: NEGATIVE
Protein, ur: NEGATIVE mg/dL
Specific Gravity, Urine: <1.005 — ABNORMAL LOW (ref 1.005–1.030)
pH: 7.5 (ref 5.0–8.0)

## 2021-02-07 LAB — LIPASE, BLOOD: Lipase: 13 U/L (ref 11–51)

## 2021-02-07 MED ORDER — SODIUM CHLORIDE 0.9 % IV BOLUS
1000.0000 mL | Freq: Once | INTRAVENOUS | Status: AC
  Administered 2021-02-07: 1000 mL via INTRAVENOUS

## 2021-02-07 MED ORDER — ALUM & MAG HYDROXIDE-SIMETH 200-200-20 MG/5ML PO SUSP
15.0000 mL | Freq: Once | ORAL | Status: AC
  Administered 2021-02-07: 15 mL via ORAL
  Filled 2021-02-07: qty 30

## 2021-02-07 MED ORDER — KETOROLAC TROMETHAMINE 15 MG/ML IJ SOLN
15.0000 mg | Freq: Once | INTRAMUSCULAR | Status: AC
  Administered 2021-02-07: 15 mg via INTRAVENOUS
  Filled 2021-02-07: qty 1

## 2021-02-07 MED ORDER — LIDOCAINE 5 % EX PTCH
1.0000 | MEDICATED_PATCH | Freq: Once | CUTANEOUS | Status: DC
  Filled 2021-02-07: qty 1

## 2021-02-07 MED ORDER — SUCRALFATE 1 GM/10ML PO SUSP
1.0000 g | Freq: Once | ORAL | Status: AC
  Administered 2021-02-07: 1 g via ORAL
  Filled 2021-02-07: qty 10

## 2021-02-07 MED ORDER — FAMOTIDINE IN NACL 20-0.9 MG/50ML-% IV SOLN
20.0000 mg | Freq: Once | INTRAVENOUS | Status: AC
  Administered 2021-02-07: 20 mg via INTRAVENOUS
  Filled 2021-02-07: qty 50

## 2021-02-07 MED ORDER — DIAZEPAM 5 MG PO TABS
5.0000 mg | ORAL_TABLET | Freq: Once | ORAL | Status: AC
  Administered 2021-02-07: 5 mg via ORAL
  Filled 2021-02-07: qty 1

## 2021-02-07 MED ORDER — ACETAMINOPHEN 325 MG PO TABS
650.0000 mg | ORAL_TABLET | Freq: Once | ORAL | Status: AC
  Administered 2021-02-07: 650 mg via ORAL
  Filled 2021-02-07: qty 2

## 2021-02-07 NOTE — ED Provider Notes (Signed)
[facility] DEPT Provider Note   CSN: [id number] Arrival date & time: 02/07/21  0355     History Chief Complaint  Patient presents with   Generalized Body Aches    Anthony Lamb is a 65 y.o. male.  65 year old male with history as below presents to the ER secondary to anxiety, emesis, generalized body aches.  Patient reports history of ongoing anxiety, he takes Ativan as needed at home.  Last time he took his Ativan was yesterday evening.  Reports that he has a lot of activities coming up that are causing him anxiety.  No SI or HI, no hallucinations.  Follows with psychiatry, stopped taking his antidepressants approximately 2 months ago because he reports they "made him feel worse."  Ativan prescribed by PCP per patient.   Patient does report he feels he may be dehydrated, he has been having poor appetite the last few days.  Emesis last night.  No ongoing abdominal pain.  No emesis this morning.  He is tolerant p.o. intake this morning.  No change in bowel or bladder function from baseline.   Patient does have generalized arthralgias secondary to ankylosing spondylitis.  Feels this pain is at his baseline.  Pain is localized to his back.  No numbness, tingling, no urinary overflow or incontinence,  red flag symptoms associated with back pain.  The history is provided by the patient. No language interpreter was used.      Past Medical History:  Diagnosis Date   Alcohol abuse, in remission    Anal fissure    Anemia    Ankylosing spondylitis (HCC)    Anxiety    Arthritis    Chronic diarrhea    Chronic headaches    Chronic pain    Colitis    Colon polyps    Depression    Esophagitis    Gastric polyps    hyperplastic and fundic gland   Gastritis    GERD (gastroesophageal reflux disease)    Hypertension    IBS (irritable bowel syndrome)    Poor dentition    SIADH (syndrome of inappropriate ADH production) ([place])     Patient Active Problem List    Diagnosis Date Noted   Family history of colon cancer 12/02/2018   Gastric polyps    Schatzki's ring    Left sided ulcerative colitis without complication ([place])    Odynophagia 11/25/2018   Dysphagia 11/25/2018   Major depressive disorder, 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   IBS (irritable bowel syndrome)    Hypertension    Colitis    Adjustment disorder with mixed anxiety and depressed mood 05/11/2017   Alcohol abuse    Anxiety    Gastroesophageal reflux disease    Osteopenia determined by x-ray 08/06/2014   Stress fracture of calcaneus 08/06/2014   Vitamin D deficiency 12/18/2013   Dementia ([place]) 12/18/2013   Benign microscopic hematuria 07/06/2013   SIADH (syndrome of inappropriate ADH production) ([place]) 06/22/2013   Ankylosing spondylitis ([place]) 06/20/2013   Malnutrition of moderate degree ([place]) 06/20/2013   BPH (benign prostatic hyperplasia) 08/22/2010   History of colonic polyps 08/13/2008   Essential hypertension 07/03/2007   PUD (peptic ulcer disease) 07/03/2007    Past Surgical History:  Procedure Laterality Date   BIOPSY  11/26/2018   Procedure: BIOPSY;  Surgeon: Gatha Mayer, MD;  Location: [facility];  Service: Endoscopy;;   COLONOSCOPY W/ BIOPSIES     COLONOSCOPY WITH PROPOFOL N/A 11/26/2018   Procedure: COLONOSCOPY WITH  PROPOFOL;  Surgeon: Gatha Mayer, MD;  Location: [facility];  Service: Endoscopy;  Laterality: N/A;   ESOPHAGOGASTRODUODENOSCOPY     ESOPHAGOGASTRODUODENOSCOPY (EGD) WITH PROPOFOL N/A 11/26/2018   Procedure: ESOPHAGOGASTRODUODENOSCOPY (EGD) WITH PROPOFOL;  Surgeon: Gatha Mayer, MD;  Location: [facility];  Service: Endoscopy;  Laterality: N/A;   HEMOSTASIS CLIP PLACEMENT  11/26/2018   Procedure: HEMOSTASIS CLIP PLACEMENT;  Surgeon: Gatha Mayer, MD;  Location: [facility];  Service: Endoscopy;;   HERNIA REPAIR Bilateral    HOT HEMOSTASIS N/A 11/26/2018   Procedure: HOT HEMOSTASIS (ARGON PLASMA  COAGULATION/BICAP);  Surgeon: Gatha Mayer, MD;  Location: [facility];  Service: Endoscopy;  Laterality: N/A;   OPEN REDUCTION INTERNAL FIXATION (ORIF) DISTAL RADIAL FRACTURE Right 02/11/2018   Procedure: OPEN REDUCTION INTERNAL FIXATION (ORIF) DISTAL RADIAL FRACTURE;  Surgeon: Milly Jakob, MD;  Location: [facility];  Service: Orthopedics;  Laterality: Right;   POLYPECTOMY  11/26/2018   Procedure: POLYPECTOMY;  Surgeon: Gatha Mayer, MD;  Location: [facility];  Service: Endoscopy;;   TONSILLECTOMY         Family History  Problem Relation Age of Onset   Anxiety disorder Mother    Congestive Heart Failure Mother    Crohn's disease Mother    Colon cancer Mother 66   Arthritis Father    High blood pressure Father    Crohn's disease Father     Social History   Tobacco Use   Smoking status: Former    Types: Cigarettes    Quit date: 2014    Years since quitting: 8.6   Smokeless tobacco: Never  Vaping Use   Vaping Use: Never used  Substance Use Topics   Alcohol use: Yes    Comment: 1 25 oz of beer a night   Drug use: Yes    Types: Marijuana    Comment: other night    Home Medications Prior to Admission medications   Medication Sig Start Date End Date Taking? Authorizing Provider  acetaminophen (TYLENOL) 325 MG tablet Take 2 tablets (650 mg total) by mouth every 6 (six) hours as needed for mild pain. (May buy over the counter) 06/27/18   Connye Burkitt, NP  amoxicillin-clavulanate (AUGMENTIN) 875-125 MG tablet Take 1 tablet by mouth every 12 (twelve) hours. 01/27/21   Regan Lemming, MD  diclofenac Sodium (VOLTAREN) 1 % GEL Apply 2 g topically 4 (four) times daily as needed. 01/05/21   Quintella Reichert, MD  Eszopiclone 3 MG TABS Take 3 mg by mouth at bedtime. 08/24/20   [provider]  lisinopril (PRINIVIL,ZESTRIL) 10 MG tablet Take 1 tablet (10 mg total) by mouth daily. Patient taking differently: Take 10 mg by mouth at bedtime. 08/21/17 01/11/21  Lacroce, Hulen Shouts, MD   LORazepam (ATIVAN) 1 MG tablet Take 1 mg by mouth every 8 (eight) hours.    [provider]  Multiple Vitamin (MULTIVITAMIN WITH MINERALS) TABS tablet Take 1 tablet by mouth daily. (May buy over the counter) 06/28/18   Connye Burkitt, NP  ondansetron (ZOFRAN ODT) 4 MG disintegrating tablet Take 1 tablet (4 mg total) by mouth every 8 (eight) hours as needed for nausea or vomiting. 01/05/21   Quintella Reichert, MD  pantoprazole (PROTONIX) 40 MG tablet Take 1 tablet (40 mg total) by mouth daily. 04/24/14   Janith Lima, MD  QUEtiapine (SEROQUEL) 300 MG tablet Take 300 mg by mouth at bedtime.     [provider]  sodium chloride (OCEAN) 0.65 % SOLN nasal spray  Place 2 sprays into both nostrils 4 (four) times daily as needed for congestion.     [provider]  sucralfate (CARAFATE) 1 G tablet Take 1 tablet (1 g total) by mouth 4 (four) times daily -  with meals and at bedtime. Chew before swallowing Patient taking differently: Take 1 g by mouth 4 (four) times daily. Chew before swallowing 12/17/13   Janith Lima, MD  verapamil (CALAN-SR) 240 MG CR tablet Take 240 mg by mouth at bedtime. 02/09/17   [provider]    Allergies    Diphenhydramine hcl, Flexeril [cyclobenzaprine], Hctz [hydrochlorothiazide], Nsaids, Gabapentin, and Hydroxyzine  Review of Systems   Review of Systems  Constitutional:  Negative for chills and fever.  HENT:  Negative for facial swelling and trouble swallowing.   Eyes:  Negative for photophobia and visual disturbance.  Respiratory:  Negative for cough and shortness of breath.   Cardiovascular:  Negative for chest pain and palpitations.  Gastrointestinal:  Positive for nausea and vomiting. Negative for abdominal pain.  Endocrine: Negative for polydipsia and polyuria.  Genitourinary:  Negative for difficulty urinating and hematuria.  Musculoskeletal:  Positive for arthralgias. Negative for gait problem and joint swelling.  Skin:   Negative for pallor and rash.  Neurological:  Negative for syncope and headaches.  Psychiatric/Behavioral:  Negative for agitation and confusion. The patient is nervous/anxious.    Physical Exam Updated Vital Signs BP 140/67 (BP Location: Left Arm)   Pulse 87   Temp 98.8 F (37.1 C) (Oral)   Resp 16   Ht 5' 10"  (1.778 m)   Wt 72.6 kg   SpO2 98%   BMI 22.96 kg/m   Physical Exam Vitals and nursing note reviewed.  Constitutional:      General: He is not in acute distress.    Appearance: Normal appearance. He is well-developed. He is not toxic-appearing.  HENT:     Head: Normocephalic and atraumatic.     Right Ear: External ear normal.     Left Ear: External ear normal.     Mouth/Throat:     Mouth: Mucous membranes are moist.  Eyes:     General: No scleral icterus. Cardiovascular:     Rate and Rhythm: Normal rate and regular rhythm.     Pulses: Normal pulses.     Heart sounds: Normal heart sounds.  Pulmonary:     Effort: Pulmonary effort is normal. No respiratory distress.     Breath sounds: Normal breath sounds.  Abdominal:     General: Abdomen is flat. Bowel sounds are normal.     Palpations: Abdomen is soft.     Tenderness: There is no abdominal tenderness.  Musculoskeletal:        General: Normal range of motion.     Cervical back: Normal range of motion.     Right lower leg: No edema.     Left lower leg: No edema.  Skin:    General: Skin is warm and dry.     Capillary Refill: Capillary refill takes less than 2 seconds.  Neurological:     General: No focal deficit present.     Mental Status: He is alert and oriented to person, place, and time.     GCS: GCS eye subscore is 4. GCS verbal subscore is 5. GCS motor subscore is 6.     Cranial Nerves: Cranial nerves are intact.     Sensory: Sensation is intact.     Motor: Tremor present.  Coordination: Coordination is intact.     Gait: Gait is intact. Gait normal.  Psychiatric:        Attention and Perception:  Attention normal. He does not perceive auditory or visual hallucinations.        Mood and Affect: Mood is anxious. Affect is not tearful.        Speech: Speech normal.        Behavior: Behavior normal. Behavior is cooperative.        Thought Content: Thought content does not include homicidal or suicidal ideation. Thought content does not include homicidal or suicidal plan.    ED Results / Procedures / Treatments   Labs (all labs ordered are listed, but only abnormal results are displayed) Labs Reviewed  COMPREHENSIVE METABOLIC PANEL - Abnormal; Notable for the following components:      Result Value   BUN <5 (*)    AST 14 (*)    All other components within normal limits  URINALYSIS, ROUTINE W REFLEX MICROSCOPIC - Abnormal; Notable for the following components:   Color, Urine COLORLESS (*)    Specific Gravity, Urine <1.005 (*)    All other components within normal limits  RESP PANEL BY RT-PCR (FLU A&B, COVID) ARPGX2  CBC WITH DIFFERENTIAL/PLATELET  LIPASE, BLOOD    EKG EKG Interpretation  Date/Time:  Monday February 07 2021 08:44:04 EDT Ventricular Rate:  97 PR Interval:  174 QRS Duration: 107 QT Interval:  398 QTC Calculation: 506 R Axis:   46 Text Interpretation: Sinus rhythm Borderline T abnormalities, inferior leads Similar to prior tracing Confirmed by Wynona Dove ([id number]) on 02/07/2021 9:07:15 AM  Radiology No results found.  Procedures Procedures   Medications Ordered in ED Medications  lidocaine (LIDODERM) 5 % 1 patch (1 patch Transdermal Patient Refused/Not Given 02/07/21 1012)  sodium chloride 0.9 % bolus 1,000 mL (0 mLs Intravenous Stopped 02/07/21 1108)  sucralfate (CARAFATE) 1 GM/10ML suspension 1 g (1 g Oral Given 02/07/21 1006)  famotidine (PEPCID) IVPB 20 mg premix (0 mg Intravenous Stopped 02/07/21 1030)  alum & mag hydroxide-simeth (MAALOX/MYLANTA) 200-200-20 MG/5ML suspension 15 mL (15 mLs Oral Given 02/07/21 1006)  diazepam (VALIUM) tablet 5 mg (5 mg Oral  Given 02/07/21 1004)  ketorolac (TORADOL) 15 MG/ML injection 15 mg (15 mg Intravenous Given 02/07/21 1116)  acetaminophen (TYLENOL) tablet 650 mg (650 mg Oral Given 02/07/21 1115)    ED Course  I have reviewed the triage vital signs and the nursing notes.  Pertinent labs & imaging results that were available during my care of the patient were reviewed by me and considered in my medical decision making (see chart for details).    MDM Rules/Calculators/A&P                           This is a 65 year old male with history as above including anxiety, ankylosing spondylitis who presents to the ER secondary to anxiety, generalized body aches.  Patient reports has been taking some Ativan which has been improving his symptoms.  He had an episode emesis last night is concerned he might be dehydrated.  No ongoing abdominal pain.  Abdomen soft, nontender, nonperitoneal.  Patient does have some chronic arthralgias secondary to his underlying ankylosing spondylitis.  Neurologic exam is nonfocal.  He is not acutely psychotic.  He is ambulatory.  Vital signs reviewed and are stable.  Serious etiology considered.  Reviewed and are reassuring.  No evidence acute infection.  No Electra abnormalities.  COVID-19 and influenza testing are negative.  EKG without evidence of acute ischemia.  Patient given oral Valium, multimodal pain control.  As reported symptoms have improved significantly.  He feels comfort going home.  Patient does report that he has not been taking his antidepressant medications over the past 2 months because he did not like the way they made him feel.  Advised patient follow-up with psychiatrist regarding antidepressant medications and to take his medications as prescribed by PCP and psychiatry.   He has history of chronic gastritis, no abdominal pain currently.  Patient to continue taking his home Carafate.  Bland diet encouraged, avoiding caffeine, avoiding known triggers.  The patient improved  significantly and was discharged in stable condition. Detailed discussions were had with the patient regarding current findings, and need for close f/u with PCP or on call doctor. The patient has been instructed to return immediately if the symptoms worsen in any way for re-evaluation. Patient verbalized understanding and is in agreement with current care plan. All questions answered prior to discharge.    Final Clinical Impression(s) / ED Diagnoses Final diagnoses:  Anxiousness  Chronic gastritis, presence of bleeding unspecified, unspecified gastritis type    Rx / DC Orders ED Discharge Orders     None        Jeanell Sparrow, DO 02/07/21 1238

## 2021-02-07 NOTE — Discharge Instructions (Addendum)
Please follow up with your psychiatrist regarding your anxiety medications. Please take your medications as prescribed by your PCP and psychiatrist.

## 2021-02-07 NOTE — ED Triage Notes (Signed)
Pt arrived via EMS and complains of generalized weakness accompanied by 3 episodes of emesis. Pt feel he may be a little dehydrated but is more concerned with anxiety and is asking medication for same.

## 2021-02-07 NOTE — ED Notes (Signed)
[employer] has been called to transport pt. He is willing to self pay. They will be here in about 10 min.

## 2021-02-08 ENCOUNTER — Ambulatory Visit: Payer: Medicaid Other | Admitting: Physician Assistant

## 2021-03-07 ENCOUNTER — Emergency Department (HOSPITAL_BASED_OUTPATIENT_CLINIC_OR_DEPARTMENT_OTHER)
Admission: EM | Admit: 2021-03-07 | Discharge: 2021-03-07 | Disposition: A | Discharge status: Home / Self Care | Payer: Medicare Other | Attending: Emergency Medicine | Admitting: Emergency Medicine

## 2021-03-07 ENCOUNTER — Other Ambulatory Visit: Payer: Self-pay

## 2021-03-07 DIAGNOSIS — R110 Nausea: Secondary | ICD-10-CM | POA: Diagnosis not present

## 2021-03-07 DIAGNOSIS — Z87891 Personal history of nicotine dependence: Secondary | ICD-10-CM | POA: Insufficient documentation

## 2021-03-07 DIAGNOSIS — I10 Essential (primary) hypertension: Secondary | ICD-10-CM | POA: Insufficient documentation

## 2021-03-07 DIAGNOSIS — F0390 Unspecified dementia without behavioral disturbance: Secondary | ICD-10-CM | POA: Insufficient documentation

## 2021-03-07 DIAGNOSIS — Z79899 Other long term (current) drug therapy: Secondary | ICD-10-CM | POA: Insufficient documentation

## 2021-03-07 DIAGNOSIS — R1310 Dysphagia, unspecified: Secondary | ICD-10-CM | POA: Insufficient documentation

## 2021-03-07 DIAGNOSIS — M542 Cervicalgia: Secondary | ICD-10-CM | POA: Insufficient documentation

## 2021-03-07 LAB — COMPREHENSIVE METABOLIC PANEL
ALT: 7 U/L (ref 0–44)
AST: 12 U/L — ABNORMAL LOW (ref 15–41)
Albumin: 3.9 g/dL (ref 3.5–5.0)
Alkaline Phosphatase: 70 U/L (ref 38–126)
Anion gap: 9 (ref 5–15)
BUN: <5 mg/dL — ABNORMAL LOW (ref 8–23)
CO2: 26 mmol/L (ref 22–32)
Calcium: 9.1 mg/dL (ref 8.9–10.3)
Chloride: 99 mmol/L (ref 98–111)
Creatinine, Ser: 0.63 mg/dL (ref 0.61–1.24)
GFR, Estimated: >60 mL/min (ref >60)
Glucose, Bld: 94 mg/dL (ref 70–99)
Potassium: 3.7 mmol/L (ref 3.5–5.1)
Sodium: 134 mmol/L — ABNORMAL LOW (ref 135–145)
Total Bilirubin: 0.5 mg/dL (ref 0.3–1.2)
Total Protein: 6.3 g/dL — ABNORMAL LOW (ref 6.5–8.1)

## 2021-03-07 LAB — CBC WITH DIFFERENTIAL/PLATELET
Abs Immature Granulocytes: 0.01 10*3/uL (ref 0.00–0.07)
Basophils Absolute: 0.1 10*3/uL (ref 0.0–0.1)
Basophils Relative: 1 %
Eosinophils Absolute: 0.1 10*3/uL (ref 0.0–0.5)
Eosinophils Relative: 2 %
HCT: 40.2 % (ref 39.0–52.0)
Hemoglobin: 13.7 g/dL (ref 13.0–17.0)
Immature Granulocytes: 0 %
Lymphocytes Relative: 29 %
Lymphs Abs: 1.8 10*3/uL (ref 0.7–4.0)
MCH: 31.8 pg (ref 26.0–34.0)
MCHC: 34.1 g/dL (ref 30.0–36.0)
MCV: 93.3 fL (ref 80.0–100.0)
Monocytes Absolute: 0.9 10*3/uL (ref 0.1–1.0)
Monocytes Relative: 14 %
Neutro Abs: 3.4 10*3/uL (ref 1.7–7.7)
Neutrophils Relative %: 54 %
Platelets: 282 10*3/uL (ref 150–400)
RBC: 4.31 MIL/uL (ref 4.22–5.81)
RDW: 13 % (ref 11.5–15.5)
WBC: 6.3 10*3/uL (ref 4.0–10.5)
nRBC: 0 % (ref 0.0–0.2)

## 2021-03-07 MED ORDER — SODIUM CHLORIDE 0.9 % IV BOLUS
500.0000 mL | Freq: Once | INTRAVENOUS | Status: AC
  Administered 2021-03-07: 500 mL via INTRAVENOUS

## 2021-03-07 MED ORDER — ONDANSETRON 4 MG PO TBDP
4.0000 mg | ORAL_TABLET | Freq: Once | ORAL | Status: AC
  Administered 2021-03-07: 4 mg via ORAL
  Filled 2021-03-07: qty 1

## 2021-03-07 MED ORDER — SUCRALFATE 1 GM/10ML PO SUSP
1.0000 g | Freq: Once | ORAL | Status: AC
  Administered 2021-03-07: 1 g via ORAL
  Filled 2021-03-07: qty 10

## 2021-03-07 NOTE — ED Triage Notes (Signed)
Pt to ED with c/o Nausea that has been ongoing for quite sometime. Pt states his mouth is very dry and has had some difficulty swallowing due to dehydration from pt being unable to hold down PO fluids.

## 2021-03-07 NOTE — Discharge Instructions (Signed)
You were seen today for nausea and trouble swallowing.  You do not have any significant signs of dehydration.  Make sure that you are drinking plenty of fluids.  Follow-up with specialist on Tuesday as previously recommended.

## 2021-03-07 NOTE — ED Notes (Signed)
Pt verbalizes understanding of discharge instructions. Opportunity for questioning and answers were provided. Armand removed by staff, pt discharged from ED to home. Educated to f/u with PCP and to drink fluids. Waiting for taxi with voucher per Charge RN approval

## 2021-03-07 NOTE — ED Provider Notes (Signed)
[facility] EMERGENCY DEPT Provider Note   CSN: [id number] Arrival date & time: 03/07/21  1829     History Chief Complaint  Patient presents with   Nausea    Anthony Lamb is a 65 y.o. male.  HPI     This is a 65 year old male with a history of ankylosing spondylitis, if esophagitis, gastritis, hypertension, alcohol abuse who presents with concern for dehydration.  Patient reports he has persistent ongoing nausea and dry mouth.  He states he has difficulty drinking plenty of fluids and he has to drive a mouth to eat anything.  He is concerned he is dehydrated.  Denies painful swallowing but states it feels like "something is stuck."  This is not a new thing for him.  He has follow-up tomorrow "to see if I have throat cancer."  Does report a history of smoking.  Also reports daily drinking.  Of note, patient is also complaining of neck pain.  He has a history of chronic neck pain.  Rates his pain at 9 out of 10.  He has not taken anything at home for his symptoms.  Past Medical History:  Diagnosis Date   Alcohol abuse, in remission    Anal fissure    Anemia    Ankylosing spondylitis (HCC)    Anxiety    Arthritis    Chronic diarrhea    Chronic headaches    Chronic pain    Colitis    Colon polyps    Depression    Esophagitis    Gastric polyps    hyperplastic and fundic gland   Gastritis    GERD (gastroesophageal reflux disease)    Hypertension    IBS (irritable bowel syndrome)    Poor dentition    SIADH (syndrome of inappropriate ADH production) ([place])     Patient Active Problem List   Diagnosis Date Noted   Family history of colon cancer 12/02/2018   Gastric polyps    Schatzki's ring    Left sided ulcerative colitis without complication ([place])    Odynophagia 11/25/2018   Dysphagia 11/25/2018   Major depressive disorder, recurrent severe without psychotic features ([place]) 06/24/2018   IBS (irritable bowel syndrome)    Hypertension    Colitis     Adjustment disorder with mixed anxiety and depressed mood 05/11/2017   Alcohol abuse    Anxiety    Gastroesophageal reflux disease    Osteopenia determined by x-ray 08/06/2014   Stress fracture of calcaneus 08/06/2014   Vitamin D deficiency 12/18/2013   Dementia ([place]) 12/18/2013   Benign microscopic hematuria 07/06/2013   SIADH (syndrome of inappropriate ADH production) ([place]) 06/22/2013   Ankylosing spondylitis ([place]) 06/20/2013   Malnutrition of moderate degree ([place]) 06/20/2013   BPH (benign prostatic hyperplasia) 08/22/2010   History of colonic polyps 08/13/2008   Essential hypertension 07/03/2007   PUD (peptic ulcer disease) 07/03/2007    Past Surgical History:  Procedure Laterality Date   BIOPSY  11/26/2018   Procedure: BIOPSY;  Surgeon: Gatha Mayer, MD;  Location: [facility];  Service: Endoscopy;;   COLONOSCOPY W/ BIOPSIES     COLONOSCOPY WITH PROPOFOL N/A 11/26/2018   Procedure: COLONOSCOPY WITH PROPOFOL;  Surgeon: Gatha Mayer, MD;  Location: [facility];  Service: Endoscopy;  Laterality: N/A;   ESOPHAGOGASTRODUODENOSCOPY     ESOPHAGOGASTRODUODENOSCOPY (EGD) WITH PROPOFOL N/A 11/26/2018   Procedure: ESOPHAGOGASTRODUODENOSCOPY (EGD) WITH PROPOFOL;  Surgeon: Gatha Mayer, MD;  [facility];  Service: Endoscopy;  Laterality: N/A;   HEMOSTASIS  CLIP PLACEMENT  11/26/2018   Procedure: HEMOSTASIS CLIP PLACEMENT;  Surgeon: Gatha Mayer, MD;  [facility];  Service: Endoscopy;;   HERNIA REPAIR Bilateral    HOT HEMOSTASIS N/A 11/26/2018   Procedure: HOT HEMOSTASIS (ARGON PLASMA COAGULATION/BICAP);  Surgeon: Gatha Mayer, MD;  Location: [facility];  Service: Endoscopy;  Laterality: N/A;   OPEN REDUCTION INTERNAL FIXATION (ORIF) DISTAL RADIAL FRACTURE Right 02/11/2018   Procedure: OPEN REDUCTION INTERNAL FIXATION (ORIF) DISTAL RADIAL FRACTURE;  Surgeon: Milly Jakob, MD;  Location: [facility];  Service: Orthopedics;  Laterality: Right;   POLYPECTOMY   11/26/2018   Procedure: POLYPECTOMY;  Surgeon: Gatha Mayer, MD;  [facility];  Service: Endoscopy;;   TONSILLECTOMY         Family History  Problem Relation Age of Onset   Anxiety disorder Mother    Congestive Heart Failure Mother    Crohn's disease Mother    Colon cancer Mother 12   Arthritis Father    High blood pressure Father    Crohn's disease Father     Social History   Tobacco Use   Smoking status: Former    Types: Cigarettes    Quit date: 2014    Years since quitting: 8.7   Smokeless tobacco: Never  Vaping Use   Vaping Use: Never used  Substance Use Topics   Alcohol use: Yes    Comment: 1 25 oz of beer a night   Drug use: Yes    Types: Marijuana    Comment: other night    Home Medications Prior to Admission medications   Medication Sig Start Date End Date Taking? Authorizing Provider  acetaminophen (TYLENOL) 325 MG tablet Take 2 tablets (650 mg total) by mouth every 6 (six) hours as needed for mild pain. (May buy over the counter) 06/27/18   Connye Burkitt, NP  amoxicillin-clavulanate (AUGMENTIN) 875-125 MG tablet Take 1 tablet by mouth every 12 (twelve) hours. 01/27/21   Regan Lemming, MD  diclofenac Sodium (VOLTAREN) 1 % GEL Apply 2 g topically 4 (four) times daily as needed. 01/05/21   Quintella Reichert, MD  Eszopiclone 3 MG TABS Take 3 mg by mouth at bedtime. 08/24/20   [provider]  lisinopril (PRINIVIL,ZESTRIL) 10 MG tablet Take 1 tablet (10 mg total) by mouth daily. Patient taking differently: Take 10 mg by mouth at bedtime. 08/21/17 01/11/21  Lacroce, Hulen Shouts, MD  LORazepam (ATIVAN) 1 MG tablet Take 1 mg by mouth every 8 (eight) hours.    [provider]  Multiple Vitamin (MULTIVITAMIN WITH MINERALS) TABS tablet Take 1 tablet by mouth daily. (May buy over the counter) 06/28/18   Connye Burkitt, NP  ondansetron (ZOFRAN ODT) 4 MG disintegrating tablet Take 1 tablet (4 mg total) by mouth every 8 (eight) hours as needed for nausea  or vomiting. 01/05/21   Quintella Reichert, MD  pantoprazole (PROTONIX) 40 MG tablet Take 1 tablet (40 mg total) by mouth daily. 04/24/14   Janith Lima, MD  QUEtiapine (SEROQUEL) 300 MG tablet Take 300 mg by mouth at bedtime.     [provider]  sodium chloride (OCEAN) 0.65 % SOLN nasal spray Place 2 sprays into both nostrils 4 (four) times daily as needed for congestion.     [provider]  sucralfate (CARAFATE) 1 G tablet Take 1 tablet (1 g total) by mouth 4 (four) times daily -  with meals and at bedtime. Chew before swallowing Patient taking differently: Take 1 g by  mouth 4 (four) times daily. Chew before swallowing 12/17/13   Janith Lima, MD  verapamil (CALAN-SR) 240 MG CR tablet Take 240 mg by mouth at bedtime. 02/09/17   [provider]    Allergies    Diphenhydramine hcl, Flexeril [cyclobenzaprine], Hctz [hydrochlorothiazide], Nsaids, Gabapentin, and Hydroxyzine  Review of Systems   Review of Systems  Constitutional:  Negative for fever.  HENT:  Positive for trouble swallowing. Negative for drooling.   Respiratory:  Negative for shortness of breath.   Cardiovascular:  Negative for chest pain.  Gastrointestinal:  Positive for nausea. Negative for abdominal pain.  Musculoskeletal:  Positive for neck pain.  All other systems reviewed and are negative.  Physical Exam Updated Vital Signs BP (!) 150/81   Pulse 94   Temp 97.8 F (36.6 C)   Resp 20   Ht 1.778 m (5' 10" )   Wt 72.6 kg   SpO2 97%   BMI 22.97 kg/m   Physical Exam Vitals and nursing note reviewed.  Constitutional:      Appearance: He is well-developed. He is not ill-appearing.  HENT:     Head: Normocephalic and atraumatic.     Nose: Nose normal.     Mouth/Throat:     Mouth: Mucous membranes are moist.     Comments: Posterior oropharynx clear, no masses noted, no trismus Eyes:     Pupils: Pupils are equal, round, and reactive to light.  Cardiovascular:     Rate and Rhythm: Normal  rate and regular rhythm.     Heart sounds: Normal heart sounds. No murmur heard. Pulmonary:     Effort: Pulmonary effort is normal. No respiratory distress.     Breath sounds: Normal breath sounds. No wheezing.  Abdominal:     General: Bowel sounds are normal.     Palpations: Abdomen is soft.     Tenderness: There is no abdominal tenderness. There is no rebound.  Musculoskeletal:     Cervical back: Neck supple.     Right lower leg: No edema.     Left lower leg: No edema.  Lymphadenopathy:     Cervical: No cervical adenopathy.  Skin:    General: Skin is warm and dry.  Neurological:     Mental Status: He is alert and oriented to person, place, and time.  Psychiatric:        Mood and Affect: Mood normal.    ED Results / Procedures / Treatments   Labs (all labs ordered are listed, but only abnormal results are displayed) Labs Reviewed  COMPREHENSIVE METABOLIC PANEL - Abnormal; Notable for the following components:      Result Value   Sodium 134 (*)    BUN <5 (*)    Total Protein 6.3 (*)    AST 12 (*)    All other components within normal limits  CBC WITH DIFFERENTIAL/PLATELET    EKG None  Radiology No results found.  Procedures Procedures   Medications Ordered in ED Medications  sucralfate (CARAFATE) 1 GM/10ML suspension 1 g (1 g Oral Given 03/07/21 0554)  ondansetron (ZOFRAN-ODT) disintegrating tablet 4 mg (4 mg Oral Given 03/07/21 0554)  sodium chloride 0.9 % bolus 500 mL (500 mLs Intravenous New Bag/Given 03/07/21 0554)    ED Course  I have reviewed the triage vital signs and the nursing notes.  Pertinent labs & imaging results that were available during my care of the patient were reviewed by me and considered in my medical decision making (see chart for details).  MDM Rules/Calculators/A&P                           Patient presents with concerns of dysphagia and nausea.  He is nontoxic and vital signs are reassuring.  He is concerned he is dehydrated.   He does not have any objective signs of dehydration.  I reviewed his chart.  He has had multiple ED visits with similar complaints including nausea, neck pain, and abdominal pain.  He reports that he will follow-up with specialist tomorrow "to see if he has cancer."  He has had previous imaging including CT head neck which is not showed any large masses.  He is not drooling and there is no obvious obstructive process.  I did obtain some basic lab work to assess for hydration status.  Lab work is largely reassuring.  He was given Zofran and Carafate.  He does continue to drink which may be contributing.  Feel patient is stable for discharge and follow-up with specialist as planned.  After history, exam, and medical workup I feel the patient has been appropriately medically screened and is safe for discharge home. Pertinent diagnoses were discussed with the patient. Patient was given return precautions.  Final Clinical Impression(s) / ED Diagnoses Final diagnoses:  Nausea  Dysphagia, unspecified type    Rx / DC Orders ED Discharge Orders     None        Catlynn Grondahl, Barbette Hair, MD 03/07/21 0630

## 2021-03-09 ENCOUNTER — Ambulatory Visit: Payer: Medicaid Other | Admitting: Nurse Practitioner

## 2021-03-24 ENCOUNTER — Other Ambulatory Visit: Payer: Self-pay

## 2021-03-24 ENCOUNTER — Encounter (HOSPITAL_BASED_OUTPATIENT_CLINIC_OR_DEPARTMENT_OTHER): Payer: Self-pay | Admitting: Emergency Medicine

## 2021-03-24 ENCOUNTER — Emergency Department (HOSPITAL_BASED_OUTPATIENT_CLINIC_OR_DEPARTMENT_OTHER)
Admission: EM | Admit: 2021-03-24 | Discharge: 2021-03-24 | Disposition: A | Discharge status: Home / Self Care | Payer: Medicare Other | Attending: Emergency Medicine | Admitting: Emergency Medicine

## 2021-03-24 DIAGNOSIS — G8929 Other chronic pain: Secondary | ICD-10-CM | POA: Insufficient documentation

## 2021-03-24 DIAGNOSIS — Z87891 Personal history of nicotine dependence: Secondary | ICD-10-CM | POA: Diagnosis not present

## 2021-03-24 DIAGNOSIS — F419 Anxiety disorder, unspecified: Secondary | ICD-10-CM | POA: Insufficient documentation

## 2021-03-24 DIAGNOSIS — R42 Dizziness and giddiness: Secondary | ICD-10-CM | POA: Insufficient documentation

## 2021-03-24 DIAGNOSIS — I10 Essential (primary) hypertension: Secondary | ICD-10-CM | POA: Insufficient documentation

## 2021-03-24 LAB — CBC WITH DIFFERENTIAL/PLATELET
Abs Immature Granulocytes: 0.02 10*3/uL (ref 0.00–0.07)
Basophils Absolute: 0.1 10*3/uL (ref 0.0–0.1)
Basophils Relative: 1 %
Eosinophils Absolute: 0.1 10*3/uL (ref 0.0–0.5)
Eosinophils Relative: 2 %
HCT: 41.7 % (ref 39.0–52.0)
Hemoglobin: 14 g/dL (ref 13.0–17.0)
Immature Granulocytes: 0 %
Lymphocytes Relative: 20 %
Lymphs Abs: 1.7 10*3/uL (ref 0.7–4.0)
MCH: 31.5 pg (ref 26.0–34.0)
MCHC: 33.6 g/dL (ref 30.0–36.0)
MCV: 93.7 fL (ref 80.0–100.0)
Monocytes Absolute: 0.8 10*3/uL (ref 0.1–1.0)
Monocytes Relative: 9 %
Neutro Abs: 6.1 10*3/uL (ref 1.7–7.7)
Neutrophils Relative %: 68 %
Platelets: ADEQUATE 10*3/uL (ref 150–400)
RBC: 4.45 MIL/uL (ref 4.22–5.81)
RDW: 12.6 % (ref 11.5–15.5)
Smear Review: ADEQUATE
WBC: 8.8 10*3/uL (ref 4.0–10.5)
nRBC: 0 % (ref 0.0–0.2)

## 2021-03-24 LAB — COMPREHENSIVE METABOLIC PANEL
ALT: 7 U/L (ref 0–44)
AST: 13 U/L — ABNORMAL LOW (ref 15–41)
Albumin: 3.9 g/dL (ref 3.5–5.0)
Alkaline Phosphatase: 69 U/L (ref 38–126)
Anion gap: 8 (ref 5–15)
BUN: <5 mg/dL — ABNORMAL LOW (ref 8–23)
CO2: 29 mmol/L (ref 22–32)
Calcium: 9.1 mg/dL (ref 8.9–10.3)
Chloride: 100 mmol/L (ref 98–111)
Creatinine, Ser: 0.68 mg/dL (ref 0.61–1.24)
GFR, Estimated: >60 mL/min (ref >60)
Glucose, Bld: 86 mg/dL (ref 70–99)
Potassium: 3.6 mmol/L (ref 3.5–5.1)
Sodium: 137 mmol/L (ref 135–145)
Total Bilirubin: 0.5 mg/dL (ref 0.3–1.2)
Total Protein: 6.3 g/dL — ABNORMAL LOW (ref 6.5–8.1)

## 2021-03-24 LAB — LIPASE, BLOOD: Lipase: 14 U/L (ref 11–51)

## 2021-03-24 MED ORDER — SUCRALFATE 1 GM/10ML PO SUSP
1.0000 g | Freq: Once | ORAL | Status: AC
  Administered 2021-03-24: 1 g via ORAL
  Filled 2021-03-24: qty 10

## 2021-03-24 MED ORDER — ONDANSETRON 4 MG PO TBDP
4.0000 mg | ORAL_TABLET | Freq: Once | ORAL | Status: AC
  Administered 2021-03-24: 4 mg via ORAL
  Filled 2021-03-24: qty 1

## 2021-03-24 MED ORDER — SUCRALFATE 1 GM/10ML PO SUSP: 1.0000 g | Freq: Three times a day (TID) | ORAL | 0 refills | Status: DC

## 2021-03-24 MED ORDER — ONDANSETRON 4 MG PO TBDP: 4.0000 mg | ORAL_TABLET | ORAL | 0 refills | Status: DC | PRN

## 2021-03-24 NOTE — ED Provider Notes (Signed)
Emergency Medicine Provider Triage Evaluation Note  Anthony Lamb , a 65 y.o. male  was evaluated in triage.  Pt complains of abdominal pain.  Review of Systems  Positive: Nausea, anorexia, abdominal pain Negative: fever  Physical Exam  BP (!) 155/71 (BP Location: Right Arm)   Pulse 83   Temp 97.9 F (36.6 C) (Oral)   Resp 20   Ht 5' 10"  (1.778 m)   Wt 72.6 kg   SpO2 99%   BMI 22.96 kg/m  Gen:   Awake, no distress   Resp:  Normal effort  MSK:   Moves extremities without difficulty  Other:    Medical Decision Making  Medically screening exam initiated at 6:27 AM.  Appropriate orders placed.  Anthony Lamb was informed that the remainder of the evaluation will be completed by another provider, this initial triage assessment does not replace that evaluation, and the importance of remaining in the ED until their evaluation is complete.    Merrily Pew, MD 03/24/21 [phone]

## 2021-03-24 NOTE — ED Triage Notes (Signed)
  Patient BIB EMS for RUQ abdominal pain that has been going on for 3 days.  Patient states he has a history of stomach ulcers and takes protonix and carafate.  Patient states he has had N/V for the past few days and pain is 7/10.

## 2021-03-24 NOTE — ED Notes (Signed)
Pt called out to nurse's station. Upon entering room pt stating "I'm withdrawing, I need something for my anxiety. I think I'm going in to [facility]". Pt stated that he is out of his at home anxiety medicine and is feeling increasingly anxious about being discharged. MD Pfieffer notified.

## 2021-03-24 NOTE — Discharge Instructions (Signed)
1.  Your labs and vital signs are stable today.  It is very important however that you follow-up with your family doctor within the next 1 to 3 days.  You should have continued evaluation and monitoring if your symptoms are persisting. 2.  You describe problems with anxiety and alcohol use.  You must seek treatment through counseling, your family physician and treatment programs.  You are prescribed Klonopin and Ativan regularly and these cannot be represcribed through the emergency department due to running out ahead of schedule.  Call your doctor today to let them know you have run out of your medications early. 3.  Try to improve your diet.  If you need additional calories and nutrition, you can use products such as Ensure and boost.  Try to stay hydrated and eat small nutritious meals.

## 2021-03-24 NOTE — ED Provider Notes (Signed)
[facility] EMERGENCY DEPT Provider Note   CSN: [id number] Arrival date & time: 03/24/21  3559     History Chief Complaint  Patient presents with   Abdominal Pain    Anthony Lamb is a 65 y.o. male.  HPI Patient reports 3 days of lightheadedness with standing.  No associated chest pain, palpitation or syncope.  No headaches.  Patient denies vomiting or diarrhea.  He reports decreased eating.  No active abdominal pain but history of chronic abdominal pain.  Nothing is making symptoms better.  Patient believes he is dehydrated.  Patient reports he typically drinks about a 54 ounce beer daily but last night only drank half because he was not feeling well.  He also reports anxiety.  Patient reports he ran out of his Klonopin just recently.  He reports it is prescribed to him monthly.  He also reports that his back pain is bothering him and would like something for back pain as well.  Denies pain burning urgency with urination.    Past Medical History:  Diagnosis Date   Alcohol abuse, in remission    Anal fissure    Anemia    Ankylosing spondylitis (HCC)    Anxiety    Arthritis    Chronic diarrhea    Chronic headaches    Chronic pain    Colitis    Colon polyps    Depression    Esophagitis    Gastric polyps    hyperplastic and fundic gland   Gastritis    GERD (gastroesophageal reflux disease)    Hypertension    IBS (irritable bowel syndrome)    Poor dentition    SIADH (syndrome of inappropriate ADH production) ([place])     Patient Active Problem List   Diagnosis Date Noted   Family history of colon cancer 12/02/2018   Gastric polyps    Schatzki's ring    Left sided ulcerative colitis without complication ([place])    Odynophagia 11/25/2018   Dysphagia 11/25/2018   Major depressive disorder, recurrent severe without psychotic features ([place]) 06/24/2018   IBS (irritable bowel syndrome)    Hypertension    Colitis    Adjustment disorder with mixed anxiety and  depressed mood 05/11/2017   Alcohol abuse    Anxiety    Gastroesophageal reflux disease    Osteopenia determined by x-ray 08/06/2014   Stress fracture of calcaneus 08/06/2014   Vitamin D deficiency 12/18/2013   Dementia ([place]) 12/18/2013   Benign microscopic hematuria 07/06/2013   SIADH (syndrome of inappropriate ADH production) ([place]) 06/22/2013   Ankylosing spondylitis ([place]) 06/20/2013   Malnutrition of moderate degree ([place]) 06/20/2013   BPH (benign prostatic hyperplasia) 08/22/2010   History of colonic polyps 08/13/2008   Essential hypertension 07/03/2007   PUD (peptic ulcer disease) 07/03/2007    Past Surgical History:  Procedure Laterality Date   BIOPSY  11/26/2018   Procedure: BIOPSY;  Surgeon: Gatha Mayer, MD;  [facility];  Service: Endoscopy;;   COLONOSCOPY W/ BIOPSIES     COLONOSCOPY WITH PROPOFOL N/A 11/26/2018   Procedure: COLONOSCOPY WITH PROPOFOL;  Surgeon: Gatha Mayer, MD;  [facility];  Service: Endoscopy;  Laterality: N/A;   ESOPHAGOGASTRODUODENOSCOPY     ESOPHAGOGASTRODUODENOSCOPY (EGD) WITH PROPOFOL N/A 11/26/2018   Procedure: ESOPHAGOGASTRODUODENOSCOPY (EGD) WITH PROPOFOL;  Surgeon: Gatha Mayer, MD;  [facility];  Service: Endoscopy;  Laterality: N/A;   HEMOSTASIS CLIP PLACEMENT  11/26/2018   Procedure: HEMOSTASIS CLIP PLACEMENT;  Surgeon: Gatha Mayer, MD;  [facility];  Service: Endoscopy;;   HERNIA REPAIR Bilateral    HOT HEMOSTASIS N/A 11/26/2018   Procedure: HOT HEMOSTASIS (ARGON PLASMA COAGULATION/BICAP);  Surgeon: Gatha Mayer, MD;  Location: [facility];  Service: Endoscopy;  Laterality: N/A;   OPEN REDUCTION INTERNAL FIXATION (ORIF) DISTAL RADIAL FRACTURE Right 02/11/2018   Procedure: OPEN REDUCTION INTERNAL FIXATION (ORIF) DISTAL RADIAL FRACTURE;  Surgeon: Milly Jakob, MD;  Location: [facility];  Service: Orthopedics;  Laterality: Right;   POLYPECTOMY  11/26/2018   Procedure: POLYPECTOMY;  Surgeon:  Gatha Mayer, MD;  [facility];  Service: Endoscopy;;   TONSILLECTOMY         Family History  Problem Relation Age of Onset   Anxiety disorder Mother    Congestive Heart Failure Mother    Crohn's disease Mother    Colon cancer Mother 66   Arthritis Father    High blood pressure Father    Crohn's disease Father     Social History   Tobacco Use   Smoking status: Former    Types: Cigarettes    Quit date: 2014    Years since quitting: 8.8   Smokeless tobacco: Never  Vaping Use   Vaping Use: Never used  Substance Use Topics   Alcohol use: Yes    Comment: 1 25 oz of beer a night   Drug use: Yes    Types: Marijuana    Comment: other night    Home Medications Prior to Admission medications   Medication Sig Start Date End Date Taking? Authorizing Provider  acetaminophen (TYLENOL) 325 MG tablet Take 2 tablets (650 mg total) by mouth every 6 (six) hours as needed for mild pain. (May buy over the counter) 06/27/18   Connye Burkitt, NP  amoxicillin-clavulanate (AUGMENTIN) 875-125 MG tablet Take 1 tablet by mouth every 12 (twelve) hours. 01/27/21   Regan Lemming, MD  diclofenac Sodium (VOLTAREN) 1 % GEL Apply 2 g topically 4 (four) times daily as needed. 01/05/21   Quintella Reichert, MD  Eszopiclone 3 MG TABS Take 3 mg by mouth at bedtime. 08/24/20   [provider]  lisinopril (PRINIVIL,ZESTRIL) 10 MG tablet Take 1 tablet (10 mg total) by mouth daily. Patient taking differently: Take 10 mg by mouth at bedtime. 08/21/17 01/11/21  Lacroce, Hulen Shouts, MD  LORazepam (ATIVAN) 1 MG tablet Take 1 mg by mouth every 8 (eight) hours.    [provider]  Multiple Vitamin (MULTIVITAMIN WITH MINERALS) TABS tablet Take 1 tablet by mouth daily. (May buy over the counter) 06/28/18   Connye Burkitt, NP  ondansetron (ZOFRAN ODT) 4 MG disintegrating tablet Take 1 tablet (4 mg total) by mouth every 8 (eight) hours as needed for nausea or vomiting. 01/05/21   Quintella Reichert, MD   pantoprazole (PROTONIX) 40 MG tablet Take 1 tablet (40 mg total) by mouth daily. 04/24/14   Janith Lima, MD  QUEtiapine (SEROQUEL) 300 MG tablet Take 300 mg by mouth at bedtime.     [provider]  sodium chloride (OCEAN) 0.65 % SOLN nasal spray Place 2 sprays into both nostrils 4 (four) times daily as needed for congestion.     [provider]  sucralfate (CARAFATE) 1 G tablet Take 1 tablet (1 g total) by mouth 4 (four) times daily -  with meals and at bedtime. Chew before swallowing Patient taking differently: Take 1 g by mouth 4 (four) times daily. Chew before swallowing 12/17/13   Janith Lima, MD  verapamil (  CALAN-SR) 240 MG CR tablet Take 240 mg by mouth at bedtime. 02/09/17   [provider]    Allergies    Diphenhydramine hcl, Flexeril [cyclobenzaprine], Hctz [hydrochlorothiazide], Nsaids, Gabapentin, and Hydroxyzine  Review of Systems   Review of Systems 10 systems reviewed and negative except as per HPI Physical Exam Updated Vital Signs BP (!) 155/71 (BP Location: Right Arm)   Pulse 83   Temp 97.9 F (36.6 C) (Oral)   Resp 20   Ht 5' 10"  (1.778 m)   Wt 72.6 kg   SpO2 99%   BMI 22.96 kg/m   Physical Exam Constitutional:      Appearance: Normal appearance.  HENT:     Head: Normocephalic and atraumatic.     Mouth/Throat:     Mouth: Mucous membranes are moist.     Pharynx: Oropharynx is clear.  Eyes:     Extraocular Movements: Extraocular movements intact.     Conjunctiva/sclera: Conjunctivae normal.  Cardiovascular:     Rate and Rhythm: Normal rate and regular rhythm.  Pulmonary:     Effort: Pulmonary effort is normal.     Comments: Breath sounds clear.  Occasional expiratory wheeze.  Symmetric air flow. Abdominal:     Comments: Soft.  Nondistended.  No guarding.  Patient diffusely endorses discomfort to palpation.  Musculoskeletal:        General: No swelling. Normal range of motion.     Cervical back: Neck supple.     Right  lower leg: No edema.     Left lower leg: No edema.     Comments: No peripheral edema.  Calves are soft and nontender.  Skin:    General: Skin is warm and dry.  Neurological:     General: No focal deficit present.     Mental Status: He is alert and oriented to person, place, and time.     Motor: No weakness.    ED Results / Procedures / Treatments   Labs (all labs ordered are listed, but only abnormal results are displayed) Labs Reviewed  CBC WITH DIFFERENTIAL/PLATELET  COMPREHENSIVE METABOLIC PANEL  LIPASE, BLOOD    EKG None  Radiology No results found.  Procedures Procedures   Medications Ordered in ED Medications  sucralfate (CARAFATE) 1 GM/10ML suspension 1 g (1 g Oral Given 03/24/21 0701)    ED Course  I have reviewed the triage vital signs and the nursing notes.  Pertinent labs & imaging results that were available during my care of the patient were reviewed by me and considered in my medical decision making (see chart for details).    MDM Rules/Calculators/A&P                          Patient presents with complaint of lightheadedness with standing.  Initial vital signs are normotensive without tachycardia.  Clinically patient does not appear significantly dehydrated.  He denies vomiting but reports poor oral intake.  We will proceed with basic lab work to rule out dehydration.  Patient does not endorse any symptoms to suggest ACS or acute neurologic emergency.  Patient does request medications for anxiety and pain both which appear to be chronic.   Review of prescribing record shows patient has had Klonopin and lorazepam prescriptions prescribed regularly.  Evaluation does not suggest acute medical condition.  Labs are stable.  No sign of dehydration, anemia or electrolyte imbalance.  Vital signs are stable.  Patient expresses desire for multiple forms of social assistance.  He advises he lives alone and does not have anyone to help him, advises he does not have  transportation means, advises he does not have a way to contact his physicians regarding his prescriptions for Klonopin or lorazepam.  Patient reports that he did not get the lorazepam prescription showing in [place] for October 1.  At this time I have made suggestion that patient contact his providers today to let them know of social needs and have included a resource guide for him to start making contact for additional assistance at home.  Final Clinical Impression(s) / ED Diagnoses Final diagnoses:  Lightheadedness  Other chronic pain  Anxiety    Rx / DC Orders ED Discharge Orders     None        Charlesetta Shanks, MD 03/24/21 [phone]

## 2021-03-24 NOTE — ED Notes (Signed)
MD notified of pt's CIWA score

## 2021-03-30 ENCOUNTER — Encounter (HOSPITAL_COMMUNITY): Payer: Self-pay

## 2021-03-30 ENCOUNTER — Emergency Department (HOSPITAL_COMMUNITY): Payer: Medicaid Other

## 2021-03-30 ENCOUNTER — Emergency Department (HOSPITAL_COMMUNITY)
Admission: EM | Admit: 2021-03-30 | Discharge: 2021-03-31 | Disposition: A | Discharge status: Other Acute Inpatient Hospital | Payer: Medicaid Other | Attending: Emergency Medicine | Admitting: Emergency Medicine

## 2021-03-30 DIAGNOSIS — R1013 Epigastric pain: Secondary | ICD-10-CM | POA: Diagnosis not present

## 2021-03-30 DIAGNOSIS — F1020 Alcohol dependence, uncomplicated: Secondary | ICD-10-CM | POA: Diagnosis not present

## 2021-03-30 DIAGNOSIS — Z20822 Contact with and (suspected) exposure to covid-19: Secondary | ICD-10-CM | POA: Diagnosis not present

## 2021-03-30 DIAGNOSIS — F329 Major depressive disorder, single episode, unspecified: Secondary | ICD-10-CM | POA: Insufficient documentation

## 2021-03-30 DIAGNOSIS — K219 Gastro-esophageal reflux disease without esophagitis: Secondary | ICD-10-CM | POA: Diagnosis not present

## 2021-03-30 DIAGNOSIS — F1994 Other psychoactive substance use, unspecified with psychoactive substance-induced mood disorder: Secondary | ICD-10-CM | POA: Diagnosis not present

## 2021-03-30 DIAGNOSIS — I10 Essential (primary) hypertension: Secondary | ICD-10-CM | POA: Insufficient documentation

## 2021-03-30 DIAGNOSIS — R45851 Suicidal ideations: Secondary | ICD-10-CM | POA: Insufficient documentation

## 2021-03-30 DIAGNOSIS — Z79899 Other long term (current) drug therapy: Secondary | ICD-10-CM | POA: Insufficient documentation

## 2021-03-30 DIAGNOSIS — F0390 Unspecified dementia without behavioral disturbance: Secondary | ICD-10-CM | POA: Insufficient documentation

## 2021-03-30 DIAGNOSIS — Z87891 Personal history of nicotine dependence: Secondary | ICD-10-CM | POA: Diagnosis not present

## 2021-03-30 DIAGNOSIS — R251 Tremor, unspecified: Secondary | ICD-10-CM | POA: Diagnosis present

## 2021-03-30 LAB — CBC
HCT: 42.3 % (ref 39.0–52.0)
Hemoglobin: 14.3 g/dL (ref 13.0–17.0)
MCH: 31.8 pg (ref 26.0–34.0)
MCHC: 33.8 g/dL (ref 30.0–36.0)
MCV: 94.2 fL (ref 80.0–100.0)
Platelets: 314 10*3/uL (ref 150–400)
RBC: 4.49 MIL/uL (ref 4.22–5.81)
RDW: 12.6 % (ref 11.5–15.5)
WBC: 8.4 10*3/uL (ref 4.0–10.5)
nRBC: 0 % (ref 0.0–0.2)

## 2021-03-30 LAB — RESP PANEL BY RT-PCR (FLU A&B, COVID) ARPGX2
Influenza A by PCR: NEGATIVE
Influenza B by PCR: NEGATIVE
SARS Coronavirus 2 by RT PCR: NEGATIVE

## 2021-03-30 LAB — URINALYSIS, ROUTINE W REFLEX MICROSCOPIC
Bacteria, UA: NONE SEEN
Bilirubin Urine: NEGATIVE
Glucose, UA: NEGATIVE mg/dL
Ketones, ur: NEGATIVE mg/dL
Leukocytes,Ua: NEGATIVE
Nitrite: NEGATIVE
Protein, ur: NEGATIVE mg/dL
Specific Gravity, Urine: 1.025 (ref 1.005–1.030)
pH: 7 (ref 5.0–8.0)

## 2021-03-30 LAB — ETHANOL: Alcohol, Ethyl (B): <10 mg/dL (ref <10)

## 2021-03-30 LAB — COMPREHENSIVE METABOLIC PANEL
ALT: 11 U/L (ref 0–44)
AST: 22 U/L (ref 15–41)
Albumin: 4.3 g/dL (ref 3.5–5.0)
Alkaline Phosphatase: 75 U/L (ref 38–126)
Anion gap: 8 (ref 5–15)
BUN: <5 mg/dL — ABNORMAL LOW (ref 8–23)
CO2: 26 mmol/L (ref 22–32)
Calcium: 9.1 mg/dL (ref 8.9–10.3)
Chloride: 99 mmol/L (ref 98–111)
Creatinine, Ser: 0.69 mg/dL (ref 0.61–1.24)
GFR, Estimated: >60 mL/min (ref >60)
Glucose, Bld: 113 mg/dL — ABNORMAL HIGH (ref 70–99)
Potassium: 3.9 mmol/L (ref 3.5–5.1)
Sodium: 133 mmol/L — ABNORMAL LOW (ref 135–145)
Total Bilirubin: 0.6 mg/dL (ref 0.3–1.2)
Total Protein: 7.4 g/dL (ref 6.5–8.1)

## 2021-03-30 LAB — MAGNESIUM: Magnesium: 2 mg/dL (ref 1.7–2.4)

## 2021-03-30 LAB — LIPASE, BLOOD: Lipase: 23 U/L (ref 11–51)

## 2021-03-30 MED ORDER — PANTOPRAZOLE SODIUM 40 MG PO TBEC
40.0000 mg | DELAYED_RELEASE_TABLET | Freq: Every day | ORAL | Status: DC
  Administered 2021-03-30 – 2021-03-31 (×2): 40 mg via ORAL
  Filled 2021-03-30 (×2): qty 1

## 2021-03-30 MED ORDER — LISINOPRIL 10 MG PO TABS
10.0000 mg | ORAL_TABLET | Freq: Every day | ORAL | Status: DC
Start: 2021-03-31 — End: 2021-03-31

## 2021-03-30 MED ORDER — SALINE SPRAY 0.65 % NA SOLN: 2.0000 | Freq: Four times a day (QID) | NASAL | Status: DC | PRN

## 2021-03-30 MED ORDER — LISINOPRIL 10 MG PO TABS: 10.0000 mg | ORAL_TABLET | Freq: Every day | ORAL | Status: DC

## 2021-03-30 MED ORDER — THIAMINE HCL 100 MG/ML IJ SOLN
100.0000 mg | Freq: Every day | INTRAMUSCULAR | Status: DC
  Administered 2021-03-30: 100 mg via INTRAVENOUS
  Filled 2021-03-30: qty 2

## 2021-03-30 MED ORDER — THIAMINE HCL 100 MG PO TABS
100.0000 mg | ORAL_TABLET | Freq: Every day | ORAL | Status: DC
  Administered 2021-03-31: 100 mg via ORAL
  Filled 2021-03-30: qty 1

## 2021-03-30 MED ORDER — LORAZEPAM 2 MG/ML IJ SOLN
0.0000 mg | Freq: Four times a day (QID) | INTRAMUSCULAR | Status: DC
Start: 2021-03-30 — End: 2021-03-31
  Administered 2021-03-30: 2 mg via INTRAVENOUS
  Filled 2021-03-30: qty 1

## 2021-03-30 MED ORDER — ONDANSETRON HCL 4 MG/2ML IJ SOLN
4.0000 mg | Freq: Once | INTRAMUSCULAR | Status: AC
  Administered 2021-03-30: 4 mg via INTRAVENOUS
  Filled 2021-03-30: qty 2

## 2021-03-30 MED ORDER — IOHEXOL 350 MG/ML SOLN
80.0000 mL | Freq: Once | INTRAVENOUS | Status: AC | PRN
  Administered 2021-03-30: 80 mL via INTRAVENOUS

## 2021-03-30 MED ORDER — QUETIAPINE FUMARATE 300 MG PO TABS
300.0000 mg | ORAL_TABLET | Freq: Every day | ORAL | Status: DC
  Administered 2021-03-30: 300 mg via ORAL
  Filled 2021-03-30: qty 1

## 2021-03-30 MED ORDER — SUCRALFATE 1 G PO TABS
1.0000 g | ORAL_TABLET | Freq: Four times a day (QID) | ORAL | Status: DC
  Administered 2021-03-30 – 2021-03-31 (×4): 1 g via ORAL
  Filled 2021-03-30 (×6): qty 1

## 2021-03-30 MED ORDER — ACETAMINOPHEN 325 MG PO TABS
650.0000 mg | ORAL_TABLET | ORAL | Status: DC | PRN
  Administered 2021-03-30 – 2021-03-31 (×2): 650 mg via ORAL
  Filled 2021-03-30 (×2): qty 2

## 2021-03-30 MED ORDER — LORAZEPAM 1 MG PO TABS
0.0000 mg | ORAL_TABLET | Freq: Four times a day (QID) | ORAL | Status: DC
  Administered 2021-03-30 – 2021-03-31 (×3): 1 mg via ORAL
  Filled 2021-03-30 (×3): qty 1

## 2021-03-30 MED ORDER — ZOLPIDEM TARTRATE 5 MG PO TABS
5.0000 mg | ORAL_TABLET | Freq: Every evening | ORAL | Status: DC | PRN
Start: 2021-03-30 — End: 2021-03-30

## 2021-03-30 MED ORDER — LORAZEPAM 2 MG/ML IJ SOLN
0.0000 mg | Freq: Two times a day (BID) | INTRAMUSCULAR | Status: DC
Start: 2021-04-02 — End: 2021-03-31

## 2021-03-30 MED ORDER — LORAZEPAM 1 MG PO TABS: 0.0000 mg | ORAL_TABLET | Freq: Two times a day (BID) | ORAL | Status: DC

## 2021-03-30 NOTE — ED Notes (Signed)
2 white belonging bag includes clothing shoes and hat in locker 29

## 2021-03-30 NOTE — ED Provider Notes (Addendum)
Emergency Medicine Provider Triage Evaluation Note  Anthony Lamb , a 65 y.o. male  was evaluated in triage.  Pt complains of shaking and anxiety for the past three days. The patient has alcohol use disorder and has not had a drink in 3 days. Normal consumption is x2 24oz cans a night. Denies any hallucinations or known seizures. Additionally, he is endorsing abdominal pain for weeks.  Review of Systems  Positive: Tremor, abdominal pain Negative: Nausea, vomiting, diarrhea, constipation, fever, hallucinations  Physical Exam  BP 137/73 (BP Location: Left Arm)   Pulse 77   Temp 98.4 F (36.9 C) (Oral)   Resp 18   SpO2 98%  Gen:   Awake, no distress, tremulous with movement. Resp:  Normal effort  MSK:   Moves extremities without difficulty  Other:  Abdomen soft.   Medical Decision Making  Medically screening exam initiated at 10:48 AM.  Appropriate orders placed.  Anthony Lamb was informed that the remainder of the evaluation will be completed by another provider, this initial triage assessment does not replace that evaluation, and the importance of remaining in the ED until their evaluation is complete.  Labs ordered and imaging ordered.    Anthony Puller, PA-C 03/30/21 1051    Anthony Puller, PA-C 03/30/21 1053    Anthony Dessert, MD 05/22/21 1610

## 2021-03-30 NOTE — ED Provider Notes (Signed)
[facility] DEPT Provider Note   CSN: [id number] Arrival date & time: 03/30/21  3220     History Chief Complaint  Patient presents with   Alcohol Problem   Suicidal    GERHARDT GLEED is a 65 y.o. male.  Presents to the ED today with SI.  Pt also feels very shaky.  Pt said he drinks 2 24 oz cans of alcohol per night.  He has not had anything to drink in 3 days.   He feels suicidal because he can't pay rent for his apartment.  The pt has a plan to take an overdose of tylenol, but has not taken them.  Pt c/o abdominal pain.  He has a hx of gastritis and ulcers.      Past Medical History:  Diagnosis Date   Alcohol abuse, in remission    Anal fissure    Anemia    Ankylosing spondylitis (HCC)    Anxiety    Arthritis    Chronic diarrhea    Chronic headaches    Chronic pain    Colitis    Colon polyps    Depression    Esophagitis    Gastric polyps    hyperplastic and fundic gland   Gastritis    GERD (gastroesophageal reflux disease)    Hypertension    IBS (irritable bowel syndrome)    Poor dentition    SIADH (syndrome of inappropriate ADH production) ([place])     Patient Active Problem List   Diagnosis Date Noted   Family history of colon cancer 12/02/2018   Gastric polyps    Schatzki's ring    Left sided ulcerative colitis without complication ([place])    Odynophagia 11/25/2018   Dysphagia 11/25/2018   Major depressive disorder, 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   IBS (irritable bowel syndrome)    Hypertension    Colitis    Adjustment disorder with mixed anxiety and depressed mood 05/11/2017   Alcohol abuse    Anxiety    Gastroesophageal reflux disease    Osteopenia determined by x-ray 08/06/2014   Stress fracture of calcaneus 08/06/2014   Vitamin D deficiency 12/18/2013   Dementia ([place]) 12/18/2013   Benign microscopic hematuria 07/06/2013   SIADH (syndrome of inappropriate ADH production) ([place])  06/22/2013   Ankylosing spondylitis ([place]) 06/20/2013   Malnutrition of moderate degree ([place]) 06/20/2013   BPH (benign prostatic hyperplasia) 08/22/2010   History of colonic polyps 08/13/2008   Essential hypertension 07/03/2007   PUD (peptic ulcer disease) 07/03/2007    Past Surgical History:  Procedure Laterality Date   BIOPSY  11/26/2018   Procedure: BIOPSY;  Surgeon: Gatha Mayer, MD;  Location: [facility];  Service: Endoscopy;;   COLONOSCOPY W/ BIOPSIES     COLONOSCOPY WITH PROPOFOL N/A 11/26/2018   Procedure: COLONOSCOPY WITH PROPOFOL;  Surgeon: Gatha Mayer, MD;  Location: [facility];  Service: Endoscopy;  Laterality: N/A;   ESOPHAGOGASTRODUODENOSCOPY     ESOPHAGOGASTRODUODENOSCOPY (EGD) WITH PROPOFOL N/A 11/26/2018   Procedure: ESOPHAGOGASTRODUODENOSCOPY (EGD) WITH PROPOFOL;  Surgeon: Gatha Mayer, MD;  Location: [facility];  Service: Endoscopy;  Laterality: N/A;   HEMOSTASIS CLIP PLACEMENT  11/26/2018   Procedure: HEMOSTASIS CLIP PLACEMENT;  Surgeon: Gatha Mayer, MD;  Location: [facility];  Service: Endoscopy;;   HERNIA REPAIR Bilateral    HOT HEMOSTASIS N/A 11/26/2018   Procedure: HOT HEMOSTASIS (ARGON PLASMA COAGULATION/BICAP);  Surgeon: Gatha Mayer, MD;  Location: [facility];  Service: Endoscopy;  Laterality: N/A;  OPEN REDUCTION INTERNAL FIXATION (ORIF) DISTAL RADIAL FRACTURE Right 02/11/2018   Procedure: OPEN REDUCTION INTERNAL FIXATION (ORIF) DISTAL RADIAL FRACTURE;  Surgeon: Milly Jakob, MD;  Location: [facility];  Service: Orthopedics;  Laterality: Right;   POLYPECTOMY  11/26/2018   Procedure: POLYPECTOMY;  Surgeon: Gatha Mayer, MD;  Location: [facility];  Service: Endoscopy;;   TONSILLECTOMY         Family History  Problem Relation Age of Onset   Anxiety disorder Mother    Congestive Heart Failure Mother    Crohn's disease Mother    Colon cancer Mother 4   Arthritis Father    High blood pressure Father    Crohn's disease Father      Social History   Tobacco Use   Smoking status: Former    Types: Cigarettes    Quit date: 2014    Years since quitting: 8.8   Smokeless tobacco: Never  Vaping Use   Vaping Use: Never used  Substance Use Topics   Alcohol use: Yes    Comment: 1 25 oz of beer a night   Drug use: Yes    Types: Marijuana    Comment: other night    Home Medications Prior to Admission medications   Medication Sig Start Date End Date Taking? Authorizing Provider  acetaminophen (TYLENOL) 325 MG tablet Take 2 tablets (650 mg total) by mouth every 6 (six) hours as needed for mild pain. (May buy over the counter) 06/27/18   Connye Burkitt, NP  amoxicillin-clavulanate (AUGMENTIN) 875-125 MG tablet Take 1 tablet by mouth every 12 (twelve) hours. 01/27/21   Regan Lemming, MD  diclofenac Sodium (VOLTAREN) 1 % GEL Apply 2 g topically 4 (four) times daily as needed. 01/05/21   Quintella Reichert, MD  Eszopiclone 3 MG TABS Take 3 mg by mouth at bedtime. 08/24/20   [provider]  lisinopril (PRINIVIL,ZESTRIL) 10 MG tablet Take 1 tablet (10 mg total) by mouth daily. Patient taking differently: Take 10 mg by mouth at bedtime. 08/21/17 01/11/21  Lacroce, Hulen Shouts, MD  LORazepam (ATIVAN) 1 MG tablet Take 1 mg by mouth every 8 (eight) hours.    [provider]  Multiple Vitamin (MULTIVITAMIN WITH MINERALS) TABS tablet Take 1 tablet by mouth daily. (May buy over the counter) 06/28/18   Connye Burkitt, NP  ondansetron (ZOFRAN ODT) 4 MG disintegrating tablet Take 1 tablet (4 mg total) by mouth every 8 (eight) hours as needed for nausea or vomiting. 01/05/21   Quintella Reichert, MD  ondansetron (ZOFRAN ODT) 4 MG disintegrating tablet Take 1 tablet (4 mg total) by mouth every 4 (four) hours as needed for nausea or vomiting. 03/24/21   Charlesetta Shanks, MD  pantoprazole (PROTONIX) 40 MG tablet Take 1 tablet (40 mg total) by mouth daily. 04/24/14   Janith Lima, MD  QUEtiapine (SEROQUEL) 300 MG tablet Take 300 mg by  mouth at bedtime.     [provider]  sodium chloride (OCEAN) 0.65 % SOLN nasal spray Place 2 sprays into both nostrils 4 (four) times daily as needed for congestion.     [provider]  sucralfate (CARAFATE) 1 G tablet Take 1 tablet (1 g total) by mouth 4 (four) times daily -  with meals and at bedtime. Chew before swallowing Patient taking differently: Take 1 g by mouth 4 (four) times daily. Chew before swallowing 12/17/13   Janith Lima, MD  sucralfate (CARAFATE) 1 GM/10ML suspension Take 10 mLs (1 g total) by mouth  4 (four) times daily -  with meals and at bedtime. 03/24/21   Charlesetta Shanks, MD  verapamil (CALAN-SR) 240 MG CR tablet Take 240 mg by mouth at bedtime. 02/09/17   [provider]    Allergies    Diphenhydramine hcl, Flexeril [cyclobenzaprine], Hctz [hydrochlorothiazide], Nsaids, Gabapentin, and Hydroxyzine  Review of Systems   Review of Systems  Gastrointestinal:  Positive for abdominal pain.  Psychiatric/Behavioral:  Positive for suicidal ideas.   All other systems reviewed and are negative.  Physical Exam Updated Vital Signs BP 117/85 (BP Location: Left Arm)   Pulse 75   Temp 98 F (36.7 C) (Oral)   Resp (!) 22   SpO2 97%   Physical Exam Vitals and nursing note reviewed.  Constitutional:      Appearance: Normal appearance.  HENT:     Head: Normocephalic and atraumatic.     Right Ear: External ear normal.     Left Ear: External ear normal.     Nose: Nose normal.     Mouth/Throat:     Mouth: Mucous membranes are moist.     Pharynx: Oropharynx is clear.  Eyes:     Extraocular Movements: Extraocular movements intact.     Conjunctiva/sclera: Conjunctivae normal.     Pupils: Pupils are equal, round, and reactive to light.  Cardiovascular:     Rate and Rhythm: Normal rate and regular rhythm.     Pulses: Normal pulses.     Heart sounds: Normal heart sounds.  Pulmonary:     Effort: Pulmonary effort is normal.     Breath sounds:  Normal breath sounds.  Abdominal:     General: Abdomen is flat. Bowel sounds are normal.     Palpations: Abdomen is soft.  Musculoskeletal:        General: Normal range of motion.     Cervical back: Normal range of motion and neck supple.  Skin:    General: Skin is warm.     Capillary Refill: Capillary refill takes less than 2 seconds.  Neurological:     General: No focal deficit present.     Mental Status: He is alert and oriented to person, place, and time.  Psychiatric:        Mood and Affect: Mood is anxious.        Thought Content: Thought content includes suicidal ideation.    ED Results / Procedures / Treatments   Labs (all labs ordered are listed, but only abnormal results are displayed) Labs Reviewed  COMPREHENSIVE METABOLIC PANEL - Abnormal; Notable for the following components:      Result Value   Sodium 133 (*)    Glucose, Bld 113 (*)    BUN <5 (*)    All other components within normal limits  RESP PANEL BY RT-PCR (FLU A&B, COVID) ARPGX2  LIPASE, BLOOD  CBC  ETHANOL  MAGNESIUM  URINALYSIS, ROUTINE W REFLEX MICROSCOPIC    EKG None  Radiology CT ABDOMEN PELVIS W CONTRAST  Result Date: 03/30/2021 CLINICAL DATA:  Acute abdominal pain, RIGHT upper quadrant abdominal pain with nausea and vomiting, 35 pounds weight loss, burning in rectum in penis, question history of bladder tumor., tremors, daily alcohol use. History ankle spondylitis, GERD, hypertension, former smoker EXAM: CT ABDOMEN AND PELVIS WITH CONTRAST TECHNIQUE: Multidetector CT imaging of the abdomen and pelvis was performed using the standard protocol following bolus administration of intravenous contrast. Sagittal and coronal MPR images reconstructed from axial data set. CONTRAST:  58m OMNIPAQUE IOHEXOL 350 MG/ML SOLN  IV. No oral contrast. COMPARISON:  11/07/2019 FINDINGS: Lower chest: Lung bases clear Hepatobiliary: Gallbladder and liver normal appearance Pancreas: Normal appearance Spleen: Normal in  appearance Adrenals/Urinary Tract: Thickening of adrenal glands without mass. Tiny cyst at upper pole of RIGHT kidney. Kidneys, ureters, and bladder normal appearance Stomach/Bowel: Normal appendix. Stomach and bowel loops normal appearance for technique Vascular/Lymphatic: Atherosclerotic calcifications aorta, the echo arteries, minimally in coronary arteries. No adenopathy. Reproductive: Unremarkable prostate gland and seminal vesicles Other: No free air or free fluid. No hernia or inflammatory process. Musculoskeletal: Diffuse osseous demineralization. Ankylosis of the thoracolumbar spine and SI joints consistent with history of ankylosing spondylitis. IMPRESSION: No acute intra-abdominal or intrapelvic abnormalities. Osseous demineralization with ankylosis of the thoracolumbar spine and SI joints consistent with history of ankylosing spondylitis. Aortic Atherosclerosis (ICD10-I70.0). Electronically Signed   By: Lavonia Dana M.D.   On: 03/30/2021 14:02    Procedures Procedures   Medications Ordered in ED Medications  LORazepam (ATIVAN) injection 0-4 mg (has no administration in time range)    Or  LORazepam (ATIVAN) tablet 0-4 mg (has no administration in time range)  LORazepam (ATIVAN) injection 0-4 mg (has no administration in time range)    Or  LORazepam (ATIVAN) tablet 0-4 mg (has no administration in time range)  thiamine tablet 100 mg (has no administration in time range)    Or  thiamine (B-1) injection 100 mg (has no administration in time range)  zolpidem (AMBIEN) tablet 5 mg (has no administration in time range)  lisinopril (ZESTRIL) tablet 10 mg (has no administration in time range)  pantoprazole (PROTONIX) EC tablet 40 mg (has no administration in time range)  QUEtiapine (SEROQUEL) tablet 300 mg (has no administration in time range)  sodium chloride (OCEAN) 0.65 % nasal spray 2 spray (has no administration in time range)  sucralfate (CARAFATE) tablet 1 g (has no administration in  time range)  iohexol (OMNIPAQUE) 350 MG/ML injection 80 mL (80 mLs Intravenous Contrast Given 03/30/21 1324)    ED Course  I have reviewed the triage vital signs and the nursing notes.  Pertinent labs & imaging results that were available during my care of the patient were reviewed by me and considered in my medical decision making (see chart for details).    MDM Rules/Calculators/A&P                           Pt's abd pain is likely due to gastritis.  Labs and CT ok.  Pt is medically clear for TTS eval.  Pt placed on CIWA protocol due to hx of alcohol abuse and feeling shaky.  Disposition per psych. Final Clinical Impression(s) / ED Diagnoses Final diagnoses:  Suicidal ideation  Epigastric pain    Rx / DC Orders ED Discharge Orders     None        [name], MD 03/30/21 1712

## 2021-03-30 NOTE — ED Triage Notes (Addendum)
Pt arrived via EMS, from home, n/v abd pain, and tremors. Endorses daily alcohol use, states last drink was "a few days ago" Also endorses burning with urination. Tremulous in triage.

## 2021-03-30 NOTE — ED Notes (Signed)
Pt now endorsing SI.

## 2021-03-31 ENCOUNTER — Other Ambulatory Visit (HOSPITAL_COMMUNITY)
Admission: EM | Admit: 2021-03-31 | Discharge: 2021-04-01 | Disposition: A | Discharge status: Home / Self Care | Payer: Medicaid Other | Source: Home / Self Care | Attending: Psychiatry | Admitting: Psychiatry

## 2021-03-31 DIAGNOSIS — R45851 Suicidal ideations: Secondary | ICD-10-CM | POA: Insufficient documentation

## 2021-03-31 DIAGNOSIS — F332 Major depressive disorder, recurrent severe without psychotic features: Secondary | ICD-10-CM | POA: Diagnosis present

## 2021-03-31 DIAGNOSIS — F1994 Other psychoactive substance use, unspecified with psychoactive substance-induced mood disorder: Secondary | ICD-10-CM | POA: Insufficient documentation

## 2021-03-31 DIAGNOSIS — Z87891 Personal history of nicotine dependence: Secondary | ICD-10-CM | POA: Insufficient documentation

## 2021-03-31 DIAGNOSIS — Z818 Family history of other mental and behavioral disorders: Secondary | ICD-10-CM | POA: Insufficient documentation

## 2021-03-31 MED ORDER — TRAZODONE HCL 50 MG PO TABS
50.0000 mg | ORAL_TABLET | Freq: Every evening | ORAL | Status: DC | PRN
  Administered 2021-04-01: 50 mg via ORAL
  Filled 2021-03-31 (×3): qty 1

## 2021-03-31 MED ORDER — LORAZEPAM 1 MG PO TABS: 0.0000 mg | ORAL_TABLET | Freq: Two times a day (BID) | ORAL | Status: DC

## 2021-03-31 MED ORDER — ONDANSETRON 4 MG PO TBDP: 4.0000 mg | ORAL_TABLET | Freq: Four times a day (QID) | ORAL | Status: DC | PRN

## 2021-03-31 MED ORDER — LORAZEPAM 1 MG PO TABS
ORAL_TABLET | ORAL | Status: AC
  Filled 2021-03-31: qty 2

## 2021-03-31 MED ORDER — LOPERAMIDE HCL 2 MG PO CAPS: 2.0000 mg | ORAL_CAPSULE | ORAL | Status: DC | PRN

## 2021-03-31 MED ORDER — ADULT MULTIVITAMIN W/MINERALS CH
1.0000 | ORAL_TABLET | Freq: Every day | ORAL | Status: DC
  Administered 2021-03-31 – 2021-04-01 (×2): 1 via ORAL
  Filled 2021-03-31 (×2): qty 1

## 2021-03-31 MED ORDER — ACETAMINOPHEN 325 MG PO TABS
650.0000 mg | ORAL_TABLET | ORAL | Status: DC | PRN
  Administered 2021-03-31: 650 mg via ORAL
  Filled 2021-03-31: qty 2

## 2021-03-31 MED ORDER — ONDANSETRON 4 MG PO TBDP
4.0000 mg | ORAL_TABLET | Freq: Three times a day (TID) | ORAL | Status: DC | PRN
  Administered 2021-03-31: 4 mg via ORAL
  Filled 2021-03-31: qty 1

## 2021-03-31 MED ORDER — MAGNESIUM HYDROXIDE 400 MG/5ML PO SUSP: 30.0000 mL | Freq: Every day | ORAL | Status: DC | PRN

## 2021-03-31 MED ORDER — ENSURE ENLIVE PO LIQD
237.0000 mL | Freq: Two times a day (BID) | ORAL | Status: DC
  Filled 2021-03-31: qty 237

## 2021-03-31 MED ORDER — SUCRALFATE 1 G PO TABS
1.0000 g | ORAL_TABLET | Freq: Four times a day (QID) | ORAL | Status: DC
  Administered 2021-03-31 – 2021-04-01 (×3): 1 g via ORAL
  Filled 2021-03-31 (×3): qty 1

## 2021-03-31 MED ORDER — LORAZEPAM 1 MG PO TABS
1.0000 mg | ORAL_TABLET | Freq: Four times a day (QID) | ORAL | Status: DC | PRN
  Administered 2021-03-31 – 2021-04-01 (×2): 1 mg via ORAL
  Filled 2021-03-31 (×2): qty 1

## 2021-03-31 MED ORDER — THIAMINE HCL 100 MG/ML IJ SOLN: 100.0000 mg | Freq: Once | INTRAMUSCULAR | Status: DC

## 2021-03-31 MED ORDER — ALUM & MAG HYDROXIDE-SIMETH 200-200-20 MG/5ML PO SUSP
30.0000 mL | ORAL | Status: DC | PRN
  Filled 2021-03-31: qty 30

## 2021-03-31 MED ORDER — LORAZEPAM 2 MG/ML IJ SOLN: 0.0000 mg | Freq: Two times a day (BID) | INTRAMUSCULAR | Status: DC

## 2021-03-31 MED ORDER — LORAZEPAM 1 MG PO TABS
2.0000 mg | ORAL_TABLET | Freq: Once | ORAL | Status: AC
  Administered 2021-03-31: 2 mg via ORAL

## 2021-03-31 MED ORDER — SALINE SPRAY 0.65 % NA SOLN: 2.0000 | Freq: Four times a day (QID) | NASAL | Status: DC | PRN

## 2021-03-31 MED ORDER — PANTOPRAZOLE SODIUM 40 MG PO TBEC
40.0000 mg | DELAYED_RELEASE_TABLET | Freq: Every day | ORAL | Status: DC
Start: 2021-04-01 — End: 2021-04-01
  Administered 2021-04-01: 40 mg via ORAL
  Filled 2021-03-31: qty 1

## 2021-03-31 MED ORDER — ACETAMINOPHEN 325 MG PO TABS: 650.0000 mg | ORAL_TABLET | Freq: Four times a day (QID) | ORAL | Status: DC | PRN

## 2021-03-31 MED ORDER — LORAZEPAM 2 MG/ML IJ SOLN: 0.0000 mg | Freq: Four times a day (QID) | INTRAMUSCULAR | Status: DC

## 2021-03-31 MED ORDER — LISINOPRIL 10 MG PO TABS
10.0000 mg | ORAL_TABLET | Freq: Every day | ORAL | Status: DC
Start: 2021-03-31 — End: 2021-04-01
  Administered 2021-03-31: 10 mg via ORAL
  Filled 2021-03-31: qty 1

## 2021-03-31 MED ORDER — THIAMINE HCL 100 MG PO TABS
100.0000 mg | ORAL_TABLET | Freq: Every day | ORAL | Status: DC
  Administered 2021-04-01: 100 mg via ORAL
  Filled 2021-03-31: qty 1

## 2021-03-31 MED ORDER — THIAMINE HCL 100 MG PO TABS: 100.0000 mg | ORAL_TABLET | Freq: Every day | ORAL | Status: DC

## 2021-03-31 MED ORDER — QUETIAPINE FUMARATE 300 MG PO TABS
300.0000 mg | ORAL_TABLET | Freq: Every day | ORAL | Status: DC
  Administered 2021-03-31: 300 mg via ORAL
  Filled 2021-03-31: qty 1

## 2021-03-31 MED ORDER — THIAMINE HCL 100 MG/ML IJ SOLN: 100.0000 mg | Freq: Every day | INTRAMUSCULAR | Status: DC

## 2021-03-31 MED ORDER — LORAZEPAM 1 MG PO TABS: 0.0000 mg | ORAL_TABLET | Freq: Four times a day (QID) | ORAL | Status: DC

## 2021-03-31 NOTE — BH Assessment (Signed)
Comprehensive Clinical Assessment (CCA) Note  03/31/2021 Anthony Lamb [id number]  Disposition: Trinna Post, PA-C recommends pt to be observed and reassessed by psychiatry. Disposition with Nash Mantis, RN.   [place] ED from 03/30/2021 in [place] [facility] ED from 03/24/2021 in [facility] [facility] ED from 02/07/2021 in [place] [facility]  C-SSRS RISK CATEGORY Moderate Risk No Risk No Risk      The patient demonstrates the following risk factors for suicide: Chronic risk factors for suicide include: psychiatric disorder of Major Depressive Disorder and substance use disorder. Acute risk factors for suicide include:  Possible homelessness, financial . Protective factors for this patient include:  None . Considering these factors, the overall suicide risk at this point appears to be moderate. Patient is appropriate for outpatient follow up.  Anthony Lamb is a 65 year old male who presents voluntary and unaccompanied to [facility]. Clinician asked the pt, "what brought you to the hospital?" Pt reports, "I don't want to live anymore." Pt reports, he been suicidal for a few weeks. Per pt, he's going to loose his apartment because his isn't getting his disability check for November. Pt reports, he called the toll free number on his disability card, his check for next month is not coming through. Pt reports, he is to give $500 to his payee who then pays his bills. Per pt, he has throat cancer and stomach problems. Pt reports, he had a plan to overdose on Tylenol but since being in the hospital he fells but better. Pt reports, having Tylenol at home. Pt also reports, he does not have food at home. Pt denies, SI, HI, AVH, self-injurious behaviors and access to weapons.   Pt reports, he drank 2, 24oz tall beers three days ago. Pt's BAL/UDS is pending. Pt is liked to [facility], staff takes him to appointments. Pt reports, he  is prescribed Lorazepam ad Ambien by Dr. Nancy Fetter is PCP at [facility]. Pt reports, he ran out of his medications. Pt reports, previous inpatient admissions to [facility] and a facility in [place].   Pt presents quiet, awake in scrubs with normal speech. Pt's mood, affect was depressed. Pt's insight was fair. Pt's judgement was poor. Pt reports, if discharged he can contract for safety. Pt reports, he doesn't want to go home tonight.  Diagnosis: Major Depressive Disorder.                   Alcohol use Disorder.   Chief Complaint:  Chief Complaint  Patient presents with   Alcohol Problem   Suicidal   Visit Diagnosis:     CCA Screening, Triage and Referral (STR)  Patient Reported Information How did you hear about [place]? No data recorded What Is the Reason for Your Visit/Call Today? Per EDP note: "Presents to the ED today with SI. Pt also feels very shaky. Pt said he drinks 2 24 oz cans of alcohol per night. He has not had anything to drink in 3 days. He feels suicidal because he can't pay rent for his apartment. The pt has a plan to take an overdose of tylenol, but has not taken them. Pt c/o abdominal pain. He has a hx of gastritis and ulcers."  How Long Has This Been Causing You Problems? 1 wk - 1 month What Do You Feel Would Help You the Most Today? Treatment for Depression or other mood problem; Alcohol or Drug Use Treatment; Transportation Assistance  Have You Recently Had  Any Thoughts About Hurting Yourself? Yes Are You Planning to Commit Suicide/Harm Yourself At This time? No data recorded  Have you Recently Had Thoughts About [place]? No Are You Planning to Harm Someone at This Time? No Explanation: No data recorded  Have You Used Any Alcohol or Drugs in the Past 24 Hours? No How Long Ago Did You Use Drugs or Alcohol? No data recorded What Did You Use and How Much? No data recorded  Do You Currently Have a Therapist/Psychiatrist? No data recorded Name  of Therapist/Psychiatrist: No data recorded  Have You Been Recently Discharged From Any Office Practice or Programs? No data recorded Explanation of Discharge From Practice/Program: No data recorded    CCA Screening Triage Referral Assessment Type of Contact: Tele-Assessment  Telemedicine Service Delivery: Telemedicine service delivery: This service was provided via telemedicine using a 2-way, interactive audio and video technology  Is this Initial or Reassessment? Initial Assessment  Date Telepsych consult ordered in [facility]:  03/30/21  Time Telepsych consult ordered in [facility]:  1614  Location of Assessment: [facility]  Provider Location: [facility]   Collateral Involvement: Pt is an only child, has no relatives and doesn't know where his cousins are.   Does Patient Have a Stage manager Guardian? No data recorded Name and Contact of Legal Guardian: No data recorded If Minor and Not Living with Parent(s), Who has Custody? No data recorded Is CPS involved or ever been involved? No data recorded Is APS involved or ever been involved? No data recorded  Patient Determined To Be At Risk for Harm To Self or Others Based on Review of Patient Reported Information or Presenting Complaint? Yes, for Self-Harm  Method: No data recorded Availability of Means: No data recorded Intent: No data recorded Notification Required: No data recorded Additional Information for Danger to Others Potential: No data recorded Additional Comments for Danger to Others Potential: No data recorded Are There Guns or Other Weapons in Your Home? No data recorded Types of Guns/Weapons: No data recorded Are These Weapons Safely Secured?                            No data recorded Who Could Verify You Are Able To Have These Secured: No data recorded Do You Have any Outstanding Charges, Pending Court Dates, Parole/Probation? No data recorded Contacted To Inform of Risk of Harm To Self or Others: No  data recorded   Does Patient Present under Involuntary Commitment? No  IVC Papers Initial File Date: No data recorded  [place] of Residence: [place]   Patient Currently Receiving the Following Services: Not Receiving Services   Determination of Need: Urgent (48 hours)   Options For Referral: Other: Comment     CCA Biopsychosocial Patient Reported Schizophrenia/Schizoaffective Diagnosis in Past: No data recorded  Strengths: No data recorded  Mental Health Symptoms Depression:   Worthlessness; Hopelessness; Irritability; Difficulty Concentrating; Fatigue; Sleep (too much or little); Increase/decrease in appetite   Duration of Depressive symptoms:    Mania:  No data recorded  Anxiety:    Sleep; Tension; Worrying; Restlessness (Pt reports, having a panic attack this morning.)   Psychosis:   None   Duration of Psychotic symptoms:    Trauma:   None   Obsessions:  No data recorded  Compulsions:  No data recorded  Inattention:   Disorganized; Forgetful; Loses things   Hyperactivity/Impulsivity:   Feeling of restlessness; Fidgets with hands/feet   Oppositional/Defiant  Behaviors:   None   Emotional Irregularity:   Recurrent suicidal behaviors/gestures/threats   Other Mood/Personality Symptoms:  No data recorded   Mental Status Exam Appearance and self-care  Stature:   Average   Weight:   Average weight   Clothing:   -- (In scrubs.)   Grooming:   Normal   Cosmetic use:   None   Posture/gait:   Normal   Motor activity:   Not Remarkable   Sensorium  Attention:   Normal   Concentration:   Normal   Orientation:   X5   Recall/memory:   Normal   Affect and Mood  Affect:   Depressed   Mood:   Hopeless; Depressed   Relating  Eye contact:   Normal   Facial expression:   Depressed   Attitude toward examiner:   Cooperative   Thought and Language  Speech flow:  Normal   Thought content:   Appropriate to Mood and  Circumstances   Preoccupation:   None   Hallucinations:   None   Organization:  No data recorded  [employer] of Knowledge:   Fair   Intelligence:  No data recorded  Abstraction:  No data recorded  Judgement:   Poor   Reality Testing:  No data recorded  Insight:   Fair   Decision Making:  No data recorded  Social Functioning  Social Maturity:  No data recorded  Social Judgement:  No data recorded  Stress  Stressors:   Housing; Teacher, music Ability:   Deficient supports; Overwhelmed   Skill Deficits:   Communication; Decision making   Supports:   Support needed     Religion: Religion/Spirituality Are You A Religious Person?:  (Per per, "I was.")  Leisure/Recreation: Leisure / Recreation Do You Have Hobbies?: No  Exercise/Diet: Exercise/Diet Do You Have Any Trouble Sleeping?: Yes Explanation of Sleeping Difficulties: Pt reports, he has not slept in days.   CCA Employment/Education Employment/Work Situation: Employment / Work Technical sales engineer: On disability Has Patient ever Been in Passenger transport manager?: No  Education: Education Is Patient Currently Attending School?: No Last Grade Completed: 12 Did You Nutritional therapist?: No   CCA Family/Childhood History Family and Relationship History: Family history Marital status: Single Does patient have children?: No  Childhood History:  Childhood History Did patient suffer any verbal/emotional/physical/sexual abuse as a child?: No Has patient ever been sexually abused/assaulted/raped as an adolescent or adult?: No Witnessed domestic violence?: No Has patient been affected by domestic violence as an adult?:  (NA)  Child/Adolescent Assessment:     CCA Substance Use Alcohol/Drug Use: Alcohol / Drug Use Pain Medications: See MAR Prescriptions: See MAR Over the Counter: See MAR History of alcohol / drug use?: Yes Withdrawal Symptoms: Other (Comment), Cramps, Sweats,  Tingling (Shaking.) Substance #1 Name of Substance 1: Alcohol. 1 - Age of First Use: UTA 1 - Amount (size/oz): Pt reports, he drank 2, 24oz tall beers three days ago. 1 - Frequency: If he was money, every night. 1 - Duration: Ongoing. 1 - Last Use / Amount: Per pt, three days ago. 1 - Method of Aquiring: Purchase. 1- Route of Use: Oral.    ASAM's:  Six Dimensions of Multidimensional Assessment  Dimension 1:  Acute Intoxication and/or Withdrawal Potential:   Dimension 1:  Description of individual's past and current experiences of substance use and withdrawal: Pt reports, he's shaking, he has had cramps, sweats, and tingling.  Dimension 2:  Biomedical Conditions and Complications:  Dimension 2:  Description of patient's biomedical conditions and  complications: Pt reports, having a lot of medical concerns such as throat cancer, abdonminal pain.  Dimension 3:  Emotional, Behavioral, or Cognitive Conditions and Complications:  Dimension 3:  Description of emotional, behavioral, or cognitive conditions and complications: Pt reports, he was suicidal with a plan to overdose on Tylenol. Pt has a previous diagnosis of Depression and Anxiety.  Dimension 4:  Readiness to Change:  Dimension 4:  Description of Readiness to Change criteria: Pt reports, he doesn't want to kill himself anymore he feels better.  Dimension 5:  Relapse, Continued use, or Continued Problem Potential:  Dimension 5:  Relapse, continued use, or continued problem potential critiera description: Pt has continued alochol use.  Dimension 6:  Recovery/Living Environment:  Dimension 6:  Recovery/Iiving environment criteria description: Pt reports, he's about to be homeless due to not getting his check for November.  ASAM Severity Score: ASAM's Severity Rating Score: 10  ASAM Recommended Level of Treatment: ASAM Recommended Level of Treatment: Level II Intensive Outpatient Treatment   Substance use Disorder (SUD) Substance Use  Disorder (SUD)  Checklist Symptoms of Substance Use: Continued use despite having a persistent/recurrent physical/psychological problem caused/exacerbated by use, Continued use despite persistent or recurrent social, interpersonal problems, caused or exacerbated by use, Evidence of tolerance  Recommendations for Services/Supports/Treatments: Recommendations for Services/Supports/Treatments Recommendations For Services/Supports/Treatments: Other (Comment) (Pt to be observed and reassess by psychiatry.)  Discharge Disposition:    DSM5 Diagnoses: Patient Active Problem List   Diagnosis Date Noted   Family history of colon cancer 12/02/2018   Gastric polyps    Schatzki's ring    Left sided ulcerative colitis without complication ([place])    Odynophagia 11/25/2018   Dysphagia 11/25/2018   Major depressive disorder, recurrent severe without psychotic features ([place]) 06/24/2018   IBS (irritable bowel syndrome)    Hypertension    Colitis    Adjustment disorder with mixed anxiety and depressed mood 05/11/2017   Alcohol abuse    Anxiety    Gastroesophageal reflux disease    Osteopenia determined by x-ray 08/06/2014   Stress fracture of calcaneus 08/06/2014   Vitamin D deficiency 12/18/2013   Dementia ([place]) 12/18/2013   Benign microscopic hematuria 07/06/2013   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/2013   Ankylosing spondylitis ([place]) 06/20/2013   Malnutrition of moderate degree ([place]) 06/20/2013   BPH (benign prostatic hyperplasia) 08/22/2010   History of colonic polyps 08/13/2008   Essential hypertension 07/03/2007   PUD (peptic ulcer disease) 07/03/2007     Referrals to Alternative Service(s): Referred to Alternative Service(s):   Place:   Date:   Time:    Referred to Alternative Service(s):   Place:   Date:   Time:    Referred to Alternative Service(s):   Place:   Date:   Time:    Referred to Alternative Service(s):   Place:   Date:   Time:     Vertell Novak, [facility] Comprehensive Clinical Assessment (CCA) Screening, Triage and Referral Note  03/31/2021 TYLAND KLEMENS [id number]  Chief Complaint:  Chief Complaint  Patient presents with   Alcohol Problem   Suicidal   Visit Diagnosis:   Patient Reported Information How did you hear about [place]? No data recorded What Is the Reason for Your Visit/Call Today? Per EDP note: "Presents to the ED today with SI. Pt also feels very shaky. Pt said he drinks 2 24 oz cans of alcohol per night. He has not  had anything to drink in 3 days. He feels suicidal because he can't pay rent for his apartment. The pt has a plan to take an overdose of tylenol, but has not taken them. Pt c/o abdominal pain. He has a hx of gastritis and ulcers."  How Long Has This Been Causing You Problems? 1 wk - 1 month What Do You Feel Would Help You the Most Today? Treatment for Depression or other mood problem; Alcohol or Drug Use Treatment; Transportation Assistance  Have You Recently Had Any Thoughts About [place]? Yes Are You Planning to Commit Suicide/Harm Yourself At This time? No data recorded  Have you Recently Had Thoughts About [place]? No Are You Planning to Harm Someone at This Time? No Explanation: No data recorded  Have You Used Any Alcohol or Drugs in the Past 24 Hours? No How Long Ago Did You Use Drugs or Alcohol? No data recorded What Did You Use and How Much? No data recorded  Do You Currently Have a Therapist/Psychiatrist? No data recorded Name of Therapist/Psychiatrist: No data recorded  Have You Been Recently Discharged From Any Office Practice or Programs? No data recorded Explanation of Discharge From Practice/Program: No data recorded   CCA Screening Triage Referral Assessment Type of Contact: Tele-Assessment  Telemedicine Service Delivery: Telemedicine service delivery: This service was provided via telemedicine using a 2-way, interactive audio and video  technology  Is this Initial or Reassessment? Initial Assessment  Date Telepsych consult ordered in [facility]:  03/30/21  Time Telepsych consult ordered in [facility]:  1614  Location of Assessment: [facility]  Provider Location: [facility]   Collateral Involvement: Pt is an only child, has no relatives and doesn't know where his cousins are.   Does Patient Have a Stage manager Guardian? No data recorded Name and Contact of Legal Guardian: No data recorded If Minor and Not Living with Parent(s), Who has Custody? No data recorded Is CPS involved or ever been involved? No data recorded Is APS involved or ever been involved? No data recorded  Patient Determined To Be At Risk for Harm To Self or Others Based on Review of Patient Reported Information or Presenting Complaint? Yes, for Self-Harm  Method: No data recorded Availability of Means: No data recorded Intent: No data recorded Notification Required: No data recorded Additional Information for Danger to Others Potential: No data recorded Additional Comments for Danger to Others Potential: No data recorded Are There Guns or Other Weapons in Your Home? No data recorded Types of Guns/Weapons: No data recorded Are These Weapons Safely Secured?                            No data recorded Who Could Verify You Are Able To Have These Secured: No data recorded Do You Have any Outstanding Charges, Pending Court Dates, Parole/Probation? No data recorded Contacted To Inform of Risk of Harm To Self or Others: No data recorded  Does Patient Present under Involuntary Commitment? No  IVC Papers Initial File Date: No data recorded  [place] of Residence: [place]   Patient Currently Receiving the Following Services: Not Receiving Services   Determination of Need: Urgent (48 hours)   Options For Referral: Other: Comment   Discharge Disposition:     Vertell Novak, [place], [place], [facility], [facility] Triage  Specialist [phone]

## 2021-03-31 NOTE — Consult Note (Addendum)
Patient is seen and reassessed by this nurse practitioner. He is observed to be lying in bed wearing his mask. He is quite appropriate, alert and oriented, very pleasant at this time. He states his reason for coming to the hospital include " not being able to keep anything down, I have throat cancer down to my bladder. I cant pay my rent anymore and will be losing my apartment. Im just so weak I don't know what to do. " Patient endorses some depressive symptoms that include hopelessness, worthlessness, anhedonia, depression, insomnia, and weight loss. All in which could be related to his cancer and stomach problems, unfortunately patient denies having an oncologist and is unclear if he has a running diagnosis of cancer versus delusion. He is able to tell me that he "Im not going to hurt myself. I would never actually kill myself. Id rather go home and check on my disability. " He reports recently being started on depression medication, and endorses non compliance at this time. We discussed at length the importance of taking his medication, as this is on the factors for inpatient psychiatric admission. He verbalize understanding, and states he will start to take his medicine. He appears to be future oriented and optimistic about following up with his PCP and endoscopy scheduled next week. He is open to receiving outpatient psychiatric services at this time. He denies any active, current or suicidal thoughts, suicidal ideations.    Addendum: After reassessment patient endorse suicidal thoughts with a plan to cut his wrist immediately upon discharge home. Patient reports to his nurse and [facility] social worker that he is suicidal and unable to contract for safety. He is now requesting treatment at [facility] for stabilization of mental health and medication management. At this time will need to update current recommendations to [facility] and or observation.

## 2021-03-31 NOTE — Discharge Instructions (Signed)
For your behavioral health needs you are advised to follow up with [facility] at your earliest opportunity:      [facility]      [place], [place] [address]      [phone]      They offer psychiatry/medication management, therapy and substance abuse treatment.  New patients are seen in their walk-in clinic.  Walk-in hours are Monday - Thursday from 8:00 am - 11:00 am for psychiatry, and Friday from 1:00 pm - 4:00 pm for therapy.  Walk-in patients are seen on a first come, first served basis, so try to arrive as early as possible for the best chance of being seen the same day.  Please note that to be eligible for services you must bring an ID or a piece of mail with your name and a [facility] address.

## 2021-03-31 NOTE — ED Notes (Signed)
Provider at bedside to talk to patient

## 2021-03-31 NOTE — BH Assessment (Addendum)
[facility] Assessment Progress Note   Per Sheran Fava, NP, this voluntary pt does not require psychiatric hospitalization at this time.  Pt is psychiatrically cleared.  Discharge instructions include referral information for [facility].  EDP Regan Lemming, MD and pt's nurse, Eustaquio Maize, have been notified.  Jalene Mullet, MA Triage Specialist [phone]  After further consideration Farris Has has determined that pt would benefit from admission to [facility].  Ernie Hew, MD has agreed to accept.  Pt has signed Voluntary Admission and Consent for Treatment, as well as Consent to Release Information to his outpatient providers and [facility] and at [facility], and notification calls have been placed.  Signed forms have been faxed to [facility].  EDP Regan Lemming, MD and pt's nurse, Eustaquio Maize, have been notified, and Eustaquio Maize agrees to send original paperwork along with pt via [employer], and to call report to [phone].  Jalene Mullet, [place] Coordinator [phone]

## 2021-03-31 NOTE — ED Notes (Signed)
Patient wants to leave now - provider made aware - patient asked 5 times to use his walker and he is flat out refusing/

## 2021-03-31 NOTE — BH Assessment (Addendum)
Per Kerry Dory there are no available beds at [facility] psych unit. However, Kerry Dory said to send over pt information for him to review.

## 2021-03-31 NOTE — ED Notes (Signed)
Patient questioning why he is receiving PO Ativan instead of IV Ativan.  Patient informed that he is tolerating oral intake well therefore PO given.  Patient stated, "Well the other nurse gave it to me in my IV."  Writer informed patient that IV would be removed since PO intake was tolerated well.

## 2021-03-31 NOTE — ED Notes (Signed)
Asked patient again to use the walker - patient refiusing

## 2021-03-31 NOTE — Progress Notes (Addendum)
CSW was consulted to speak with pt about transportation needs. Pt reported concerns about paying his rent for next month, and not having food in the home. While speaking with pt' he voiced SI. Pt stated he will harm himself when he gets home. CSW asked pt about his plan, Pt stated " I will slit my wrist if I go home." "I need you to help me not to kill myself" . Pt stated "I change my mind I want to stay at the hospital" [place] notified bedside nurse and NP about pt's comments.Pt is recommended for inpatient TOC CSW sign off.   Arlie Solomons.Alecia Doi, MSW, [place]  Transitions of Care Clinical Social Worker I Direct Dial: [phone]  Fax: [phone] [email or web address]

## 2021-03-31 NOTE — Progress Notes (Signed)
Pt is a transfer from [facility] and admitted to [facility] due to passive SI and alcohol abuse. Pt is alert and oriented. Pt is unsteady on his feet and ambulates with a walker. Pt's CIWA was 11. PRN Ativan and scheduled meds administered with no incident. Pt complained of undiagnosed throat and bladder cancer.  Pt denies current SI/HI/AVH. Staff will monitor for pt's safety.

## 2021-03-31 NOTE — ED Notes (Signed)
Provider made aware of home meds patient is requesting - provider assessing meds

## 2021-03-31 NOTE — ED Notes (Signed)
Pt off unit to [facility] per provider. Pt alert and cooperative. DC information and belongings given to [facility] for transport. Patient ambulatory off unit , using a cane, escorted by RN. Patient transported by [facility].

## 2021-03-31 NOTE — ED Provider Notes (Addendum)
Emergency Medicine Observation Re-evaluation Note  Anthony Lamb is a 65 y.o. male, seen on rounds today.  Pt initially presented to the ED for complaints of Alcohol Problem and Suicidal Currently, the patient is alert, resting comfortably in bed, requesting Ativan.  Physical Exam  BP 130/77 (BP Location: Right Arm)   Pulse 84   Temp 98 F (36.7 C) (Oral)   Resp 20   SpO2 97%  Physical Exam General: NAD Cardiac: Well perfused Lungs: Respirations even and unlabored Psych: No agitation  ED Course / MDM  EKG:   I have reviewed the labs performed to date as well as medications administered while in observation.  Recent changes in the last 24 hours include patient presented with SI in the setting of EtOH use and financial difficulties. Patient medically cleared yesterday for TTS evaluation. Pt is on CIWA protocol.  Plan  Current plan is for Trinna Post, PA-C recommends pt to be observed and reassessed by psychiatry.    Anthony Lamb is not under involuntary commitment.  1027 Following re-assessment by psychiatry, "Addendum: After reassessment patient endorse suicidal thoughts with a plan to cut his wrist immediately upon discharge home. Patient reports to his nurse and [facility] social worker that he is suicidal and unable to contract for safety. He is now requesting treatment at [facility] for stabilization of mental health and medication management. At this time will need to update current recommendations to [facility] and or observation. " EMTALA part 1 completed.       Regan Lemming, MD 03/31/21 1443    Regan Lemming, MD 03/31/21 1444

## 2021-03-31 NOTE — ED Notes (Signed)
Patient arrived on unit unsteady gate given meal

## 2021-03-31 NOTE — ED Provider Notes (Signed)
Behavioral Health Admission H&P [facility] & OBS)  Date: 03/31/21 Patient Name: Anthony Lamb MRN: [id number] Chief Complaint: No chief complaint on file.     Diagnoses:  Final diagnoses:  Substance induced mood disorder (HCC)    HPI:  64 year old male with a history of alcohol use, MDD and benzodiazepine use who presented to [facility] ED for suicidal ideation.  Patient was evaluated in the ED by psychiatry and initially was able to contract for safety and was going to be discharged; however, on discharge patient reported he was suicidal with a plan to slit his wrists.  Patient was recommended for [facility] for further treatment.  Upon arrival patient was noted to ambulate with a walker.  Per chart review patient has a history of substance use which she admits to and it was determined it would be safest to admit patient to the [facility] for observation as he will be need to placed on alcohol withdrawal protocol and patient safety would be most easily maintained in the observation unit while  placement an geripsych facility is searched. On interview patient appears anxious.  He states that he presented to the hospital initially because "I got a lot of health problems" and that he was concerned about losing his housing as he was worried he is worried he would not receive his money on November 1.  Patient states that he called a phone number and that it usually says that the deposit is coming in; however, he did not receive that message so he is concerned that he may not receive it.  Patient states that this has never happened before so he is not sure if he will receive the money.  Patient continues to be unable to contract for safety.  He describes his mood is "not real good".  Patient states that he has not been able to sleep for several days.  He denies HI he denies AVH.  Patient reports that he has been drinking approximately 50 ounces of beer daily with his last drink about 5 days ago.  Per chart review  patient has received Ativan while in the ED he reports current withdrawal symptoms of anxiety and tremors.  Patient expresses concern about receiving his medications.  Per chart review patient had reported to psych NP in the ED that he had throat cancer; however, patient did not report this on interview today.  On chart review there is no evidence of any throat cancer that he has been diagnosed with.  Patient reports previous medication trials of duloxetine and Rexulti however he states that these medications have not been helpful.  Patient is focused on receiving his Ativan and Seroquel.  He reports 2 previous psychiatric hospitalizations when at [facility] and 1 in [place].  Patient states on both occasions reason for hospitalization with suicidal thoughts.  He denies previous suicide attempts.  Patient states that he sees a psychiatrist through [facility] and is seen within the last month.  Patient has not married he has no children he receives disability as a source of income.  He highest level of education is a high school diploma.  Patient states he lives in an apartment.  He denies access to a gun.  Patient reports a family history of anxiety in his mother.  PHQ 2-9:   [facility] ED from 03/30/2021 in [facility]PT ED from 03/24/2021 in [facility] ED from 02/07/2021 in [facility]  C-SSRS RISK CATEGORY Moderate Risk No  Risk No Risk        Total Time spent with patient: 30 minutes  Musculoskeletal  Strength & Muscle Tone: decreased and needs assistance with walker Gait & Station:  slow Patient leans: N/A  Psychiatric Specialty Exam  Presentation General Appearance: Casual; Disheveled Eye Contact:Fair Speech:Clear and Coherent; Normal Rate Speech Volume:Normal Handedness:No data recorded  Mood and Affect  Mood:Depressed; Dysphoric; Anxious Affect:Appropriate; Congruent  Thought Process   Thought Processes:Coherent; Goal Directed; Linear Descriptions of Associations:Intact Orientation:Full (Time, Place and Person) Thought Content:WDL; Logical   Hallucinations:Hallucinations: None Ideas of Reference:None Suicidal Thoughts:Suicidal Thoughts: Yes, Passive (passive currently, active when in the ED) SI Passive Intent and/or Plan: Without Plan Homicidal Thoughts:Homicidal Thoughts: No  Sensorium  Memory:Immediate Fair; Recent Good; Remote Fair Judgment:Fair Insight:Fair  Executive Functions  Concentration:Fair Attention Span:Fair [place]  Psychomotor Activity  Psychomotor Activity:Psychomotor Activity: Tremor  Assets  Assets:Communication Skills; Desire for Improvement; Resilience  Sleep  Sleep:Sleep: Poor  No data recorded  Physical Exam Constitutional:      Appearance: He is normal weight.  HENT:     Head: Normocephalic and atraumatic.  Eyes:     Extraocular Movements: Extraocular movements intact.  Pulmonary:     Effort: Pulmonary effort is normal.  Neurological:     General: No focal deficit present.     Mental Status: He is alert and oriented to person, place, and time.  Psychiatric:        Attention and Perception: Attention and perception normal.        Speech: Speech normal.        Behavior: Behavior normal. Behavior is cooperative.        Thought Content: Thought content normal.   Review of Systems  Constitutional:  Negative for chills and fever.  HENT:  Negative for hearing loss.   Eyes:  Negative for discharge and redness.  Respiratory:  Negative for cough.   Cardiovascular:  Negative for chest pain.  Gastrointestinal:  Positive for abdominal pain and heartburn.  Musculoskeletal:  Negative for myalgias.  Neurological:  Positive for tremors.  Psychiatric/Behavioral:  Positive for depression and suicidal ideas. Negative for hallucinations. The patient is nervous/anxious and has insomnia.     Blood pressure (!) 152/95, pulse 85, temperature 98.7 F (37.1 C), SpO2 98 %. There is no height or weight on file to calculate BMI.  Past Psychiatric History: depression, substance use   Is the patient at risk to self? Yes  Has the patient been a risk to self in the past 6 months? no Has the patient been a risk to self within the distant past? Yes   Is the patient a risk to others? No   Has the patient been a risk to others in the past 6 months? No   Has the patient been a risk to others within the distant past? No   Past Medical History:  Past Medical History:  Diagnosis Date   Alcohol abuse, in remission    Anal fissure    Anemia    Ankylosing spondylitis (HCC)    Anxiety    Arthritis    Chronic diarrhea    Chronic headaches    Chronic pain    Colitis    Colon polyps    Depression    Esophagitis    Gastric polyps    hyperplastic and fundic gland   Gastritis    GERD (gastroesophageal reflux disease)    Hypertension    IBS (irritable bowel syndrome)  Poor dentition    SIADH (syndrome of inappropriate ADH production) [facility])     Past Surgical History:  Procedure Laterality Date   BIOPSY  11/26/2018   Procedure: BIOPSY;  Surgeon: Gatha Mayer, MD;  Location: [facility];  Service: Endoscopy;;   COLONOSCOPY W/ BIOPSIES     COLONOSCOPY WITH PROPOFOL N/A 11/26/2018   Procedure: COLONOSCOPY WITH PROPOFOL;  Surgeon: Gatha Mayer, MD;  Location: [facility];  Service: Endoscopy;  Laterality: N/A;   ESOPHAGOGASTRODUODENOSCOPY     ESOPHAGOGASTRODUODENOSCOPY (EGD) WITH PROPOFOL N/A 11/26/2018   Procedure: ESOPHAGOGASTRODUODENOSCOPY (EGD) WITH PROPOFOL;  Surgeon: Gatha Mayer, MD;  Location: [facility];  Service: Endoscopy;  Laterality: N/A;   HEMOSTASIS CLIP PLACEMENT  11/26/2018   Procedure: HEMOSTASIS CLIP PLACEMENT;  Surgeon: Gatha Mayer, MD;  Location: [facility];  Service: Endoscopy;;   HERNIA REPAIR Bilateral    HOT HEMOSTASIS N/A 11/26/2018    Procedure: HOT HEMOSTASIS (ARGON PLASMA COAGULATION/BICAP);  Surgeon: Gatha Mayer, MD;  Location: [facility];  Service: Endoscopy;  Laterality: N/A;   OPEN REDUCTION INTERNAL FIXATION (ORIF) DISTAL RADIAL FRACTURE Right 02/11/2018   Procedure: OPEN REDUCTION INTERNAL FIXATION (ORIF) DISTAL RADIAL FRACTURE;  Surgeon: Milly Jakob, MD;  Location: [place];  Service: Orthopedics;  Laterality: Right;   POLYPECTOMY  11/26/2018   Procedure: POLYPECTOMY;  Surgeon: Gatha Mayer, MD;  Location: [facility];  Service: Endoscopy;;   TONSILLECTOMY      Family History:  Family History  Problem Relation Age of Onset   Anxiety disorder Mother    Congestive Heart Failure Mother    Crohn's disease Mother    Colon cancer Mother 42   Arthritis Father    High blood pressure Father    Crohn's disease Father     Social History:  Social History   Socioeconomic History   Marital status: Single    Spouse name: Not on file   Number of children: 0   Years of education: Not on file   Highest education level: Not on file  Occupational History   Not on file  Tobacco Use   Smoking status: Former    Types: Cigarettes    Quit date: 2014    Years since quitting: 8.8   Smokeless tobacco: Never  Vaping Use   Vaping Use: Never used  Substance and Sexual Activity   Alcohol use: Yes    Comment: 1 25 oz of beer a night   Drug use: Yes    Types: Marijuana    Comment: other night   Sexual activity: Not Currently  Other Topics Concern   Not on file  Social History Narrative   HSG. Long - term disability - unable to work. Lived with his mother in her house - she died 20-Apr-2023 -    Lives in apartment   Has case worker   Social Determinants of Radio broadcast assistant Strain: Not on file  Food Insecurity: Not on file  Transportation Needs: Not on file  Physical Activity: Not on file  Stress: Not on file  Social Connections: Not on file  Intimate Partner Violence: Not on file    SDOH:   SDOH Screenings   Alcohol Screen: Not on file  Depression (PHQ2-9): Not on file  Financial Resource Strain: Not on file  Food Insecurity: Not on file  Housing: Not on file  Physical Activity: Not on file  Social Connections: Not on file  Stress: Not on file  Tobacco Use: Medium Risk  Smoking Tobacco Use: Former   Smokeless Tobacco Use: Never   Passive Exposure: Not on file  Transportation Needs: Not on file    Last Labs:  Admission on 03/30/2021, Discharged on 03/31/2021  Component Date Value Ref Range Status   Lipase 03/30/2021 23  11 - 51 U/L Final   Performed at [facility], [place] [address], [place], [place] [address]   Sodium 03/30/2021 133 (A)  135 - 145 mmol/L Final   Potassium 03/30/2021 3.9  3.5 - 5.1 mmol/L Final   Chloride 03/30/2021 99  98 - 111 mmol/L Final   CO2 03/30/2021 26  22 - 32 mmol/L Final   Glucose, Bld 03/30/2021 113 (A)  70 - 99 mg/dL Final   Glucose reference range applies only to samples taken after fasting for at least 8 hours.   BUN 03/30/2021 <5 (A)  8 - 23 mg/dL Final   Creatinine, Ser 03/30/2021 0.69  0.61 - 1.24 mg/dL Final   Calcium 03/30/2021 9.1  8.9 - 10.3 mg/dL Final   Total Protein 03/30/2021 7.4  6.5 - 8.1 g/dL Final   Albumin 03/30/2021 4.3  3.5 - 5.0 g/dL Final   AST 03/30/2021 22  15 - 41 U/L Final   ALT 03/30/2021 11  0 - 44 U/L Final   Alkaline Phosphatase 03/30/2021 75  38 - 126 U/L Final   Total Bilirubin 03/30/2021 0.6  0.3 - 1.2 mg/dL Final   GFR, Estimated 03/30/2021 >60  >60 mL/min Final   Comment: (NOTE) Calculated using the CKD-EPI Creatinine Equation (2021)    Anion gap 03/30/2021 8  5 - 15 Final   Performed at [facility], [place] [address], [place], [place] [address]   WBC 03/30/2021 8.4  4.0 - 10.5 K/uL Final   RBC 03/30/2021 4.49  4.22 - 5.81 MIL/uL Final   Hemoglobin 03/30/2021 14.3  13.0 - 17.0 g/dL Final   HCT 03/30/2021 42.3  39.0 - 52.0 % Final   MCV 03/30/2021 94.2   80.0 - 100.0 fL Final   MCH 03/30/2021 31.8  26.0 - 34.0 pg Final   MCHC 03/30/2021 33.8  30.0 - 36.0 g/dL Final   RDW 03/30/2021 12.6  11.5 - 15.5 % Final   Platelets 03/30/2021 314  150 - 400 K/uL Final   nRBC 03/30/2021 0.0  0.0 - 0.2 % Final   Performed at [facility], [place] [address] [place], [place] [address]   Color, Urine 03/30/2021 YELLOW  YELLOW Final   APPearance 03/30/2021 CLEAR  CLEAR Final   Specific Gravity, Urine 03/30/2021 1.025  1.005 - 1.030 Final   pH 03/30/2021 7.0  5.0 - 8.0 Final   Glucose, UA 03/30/2021 NEGATIVE  NEGATIVE mg/dL Final   Hgb urine dipstick 03/30/2021 SMALL (A)  NEGATIVE Final   Bilirubin Urine 03/30/2021 NEGATIVE  NEGATIVE Final   Ketones, ur 03/30/2021 NEGATIVE  NEGATIVE mg/dL Final   Protein, ur 03/30/2021 NEGATIVE  NEGATIVE mg/dL Final   Nitrite 03/30/2021 NEGATIVE  NEGATIVE Final   Leukocytes,Ua 03/30/2021 NEGATIVE  NEGATIVE Final   RBC / HPF 03/30/2021 0-5  0 - 5 RBC/hpf Final   WBC, UA 03/30/2021 0-5  0 - 5 WBC/hpf Final   Bacteria, UA 03/30/2021 NONE SEEN  NONE SEEN Final   Performed at [facility], [place] [address] [place], [place] [address]   Alcohol, Ethyl (B) 03/30/2021 <10  <10 mg/dL Final   Comment: (NOTE) Lowest detectable limit for serum alcohol is 10  mg/dL.  For medical purposes only. Performed at [facility], [place] [address] [place] [address], [place], [place] [address]    Magnesium 03/30/2021 2.0  1.7 - 2.4 mg/dL Final   Performed at [place] [address], [place], [place] [address]   SARS Coronavirus 2 by RT PCR 03/30/2021 NEGATIVE  NEGATIVE Final   Comment: (NOTE) SARS-CoV-2 target nucleic acids are NOT DETECTED.  The SARS-CoV-2 RNA is generally detectable in upper respiratory specimens during the acute phase of infection. The lowest concentration of SARS-CoV-2 viral copies this assay can detect is 138 copies/mL. A negative result does not preclude  SARS-Cov-2 infection and should not be used as the sole basis for treatment or other patient management decisions. A negative result may occur with  improper specimen collection/handling, submission of specimen other than nasopharyngeal swab, presence of viral mutation(s) within the areas targeted by this assay, and inadequate number of viral copies(<138 copies/mL). A negative result must be combined with clinical observations, patient history, and epidemiological information. The expected result is Negative.  Fact Sheet for Patients:  EntrepreneurPulse.com.au  Fact Sheet for Healthcare Providers:  IncredibleEmployment.be  This test is no                          t yet approved or cleared by the [place] FDA and  has been authorized for detection and/or diagnosis of SARS-CoV-2 by FDA under an Emergency Use Authorization (EUA). This EUA will remain  in effect (meaning this test can be used) for the duration of the COVID-19 declaration under Section 564(b)(1) of the Act, 21 U.S.C.section 360bbb-3(b)(1), unless the authorization is terminated  or revoked sooner.       Influenza A by PCR 03/30/2021 NEGATIVE  NEGATIVE Final   Influenza B by PCR 03/30/2021 NEGATIVE  NEGATIVE Final   Comment: (NOTE) The Xpert Xpress SARS-CoV-2/FLU/RSV plus assay is intended as an aid in the diagnosis of influenza from Nasopharyngeal swab specimens and should not be used as a sole basis for treatment. Nasal washings and aspirates are unacceptable for Xpert Xpress SARS-CoV-2/FLU/RSV testing.  Fact Sheet for Patients: EntrepreneurPulse.com.au  Fact Sheet for Healthcare Providers: IncredibleEmployment.be  This test is not yet approved or cleared by the [place] FDA and has been authorized for detection and/or diagnosis of SARS-CoV-2 by FDA under an Emergency Use Authorization (EUA). This EUA will remain in effect  (meaning this test can be used) for the duration of the COVID-19 declaration under Section 564(b)(1) of the Act, 21 U.S.C. section 360bbb-3(b)(1), unless the authorization is terminated or revoked.  Performed at [facility], [place] [address] [place], [place] [address]   Admission on 03/24/2021, Discharged on 03/24/2021  Component Date Value Ref Range Status   WBC 03/24/2021 8.8  4.0 - 10.5 K/uL Final   RBC 03/24/2021 4.45  4.22 - 5.81 MIL/uL Final   Hemoglobin 03/24/2021 14.0  13.0 - 17.0 g/dL Final   HCT 03/24/2021 41.7  39.0 - 52.0 % Final   MCV 03/24/2021 93.7  80.0 - 100.0 fL Final   MCH 03/24/2021 31.5  26.0 - 34.0 pg Final   MCHC 03/24/2021 33.6  30.0 - 36.0 g/dL Final   RDW 03/24/2021 12.6  11.5 - 15.5 % Final   Platelets 03/24/2021 PLATELET CLUMPS NOTED ON SMEAR, COUNT APPEARS ADEQUATE  150 - 400 K/uL Final   SPECIMEN CHECKED FOR CLOTS   nRBC 03/24/2021 0.0  0.0 - 0.2 % Final  Neutrophils Relative % 03/24/2021 68  % Final   Neutro Abs 03/24/2021 6.1  1.7 - 7.7 K/uL Final   Lymphocytes Relative 03/24/2021 20  % Final   Lymphs Abs 03/24/2021 1.7  0.7 - 4.0 K/uL Final   Monocytes Relative 03/24/2021 9  % Final   Monocytes Absolute 03/24/2021 0.8  0.1 - 1.0 K/uL Final   Eosinophils Relative 03/24/2021 2  % Final   Eosinophils Absolute 03/24/2021 0.1  0.0 - 0.5 K/uL Final   Basophils Relative 03/24/2021 1  % Final   Basophils Absolute 03/24/2021 0.1  0.0 - 0.1 K/uL Final   WBC Morphology 03/24/2021 MORPHOLOGY UNREMARKABLE   Final   RBC Morphology 03/24/2021 MORPHOLOGY UNREMARKABLE   Final   Smear Review 03/24/2021 PLATELETS APPEAR ADEQUATE   Final   Immature Granulocytes 03/24/2021 0  % Final   Abs Immature Granulocytes 03/24/2021 0.02  0.00 - 0.07 K/uL Final   Performed at [facility], [address], [place], [place] [address]   Sodium 03/24/2021 137  135 - 145 mmol/L Final   Potassium 03/24/2021 3.6  3.5 - 5.1 mmol/L Final    Chloride 03/24/2021 100  98 - 111 mmol/L Final   CO2 03/24/2021 29  22 - 32 mmol/L Final   Glucose, Bld 03/24/2021 86  70 - 99 mg/dL Final   Glucose reference range applies only to samples taken after fasting for at least 8 hours.   BUN 03/24/2021 <5 (A)  8 - 23 mg/dL Final   Creatinine, Ser 03/24/2021 0.68  0.61 - 1.24 mg/dL Final   Calcium 03/24/2021 9.1  8.9 - 10.3 mg/dL Final   Total Protein 03/24/2021 6.3 (A)  6.5 - 8.1 g/dL Final   Albumin 03/24/2021 3.9  3.5 - 5.0 g/dL Final   AST 03/24/2021 13 (A)  15 - 41 U/L Final   ALT 03/24/2021 7  0 - 44 U/L Final   Alkaline Phosphatase 03/24/2021 69  38 - 126 U/L Final   Total Bilirubin 03/24/2021 0.5  0.3 - 1.2 mg/dL Final   GFR, Estimated 03/24/2021 >60  >60 mL/min Final   Comment: (NOTE) Calculated using the CKD-EPI Creatinine Equation (2021)    Anion gap 03/24/2021 8  5 - 15 Final   Performed at [facility], [address] [place], [place] [address]   Lipase 03/24/2021 14  11 - 51 U/L Final   Performed at [facility], [address], [place], [place] [address]  Admission on 03/07/2021, Discharged on 03/07/2021  Component Date Value Ref Range Status   WBC 03/07/2021 6.3  4.0 - 10.5 K/uL Final   RBC 03/07/2021 4.31  4.22 - 5.81 MIL/uL Final   Hemoglobin 03/07/2021 13.7  13.0 - 17.0 g/dL Final   HCT 03/07/2021 40.2  39.0 - 52.0 % Final   MCV 03/07/2021 93.3  80.0 - 100.0 fL Final   MCH 03/07/2021 31.8  26.0 - 34.0 pg Final   MCHC 03/07/2021 34.1  30.0 - 36.0 g/dL Final   RDW 03/07/2021 13.0  11.5 - 15.5 % Final   Platelets 03/07/2021 282  150 - 400 K/uL Final   nRBC 03/07/2021 0.0  0.0 - 0.2 % Final   Neutrophils Relative % 03/07/2021 54  % Final   Neutro Abs 03/07/2021 3.4  1.7 - 7.7 K/uL Final   Lymphocytes Relative 03/07/2021 29  % Final   Lymphs Abs 03/07/2021 1.8  0.7 - 4.0 K/uL Final   Monocytes Relative 03/07/2021 14  % Final  Monocytes Absolute 03/07/2021 0.9  0.1 - 1.0 K/uL  Final   Eosinophils Relative 03/07/2021 2  % Final   Eosinophils Absolute 03/07/2021 0.1  0.0 - 0.5 K/uL Final   Basophils Relative 03/07/2021 1  % Final   Basophils Absolute 03/07/2021 0.1  0.0 - 0.1 K/uL Final   Immature Granulocytes 03/07/2021 0  % Final   Abs Immature Granulocytes 03/07/2021 0.01  0.00 - 0.07 K/uL Final   Performed at [facility], [place], [place] [address]   Sodium 03/07/2021 134 (A)  135 - 145 mmol/L Final   Potassium 03/07/2021 3.7  3.5 - 5.1 mmol/L Final   Chloride 03/07/2021 99  98 - 111 mmol/L Final   CO2 03/07/2021 26  22 - 32 mmol/L Final   Glucose, Bld 03/07/2021 94  70 - 99 mg/dL Final   Glucose reference range applies only to samples taken after fasting for at least 8 hours.   BUN 03/07/2021 <5 (A)  8 - 23 mg/dL Final   Creatinine, Ser 03/07/2021 0.63  0.61 - 1.24 mg/dL Final   Calcium 03/07/2021 9.1  8.9 - 10.3 mg/dL Final   Total Protein 03/07/2021 6.3 (A)  6.5 - 8.1 g/dL Final   Albumin 03/07/2021 3.9  3.5 - 5.0 g/dL Final   AST 03/07/2021 12 (A)  15 - 41 U/L Final   ALT 03/07/2021 7  0 - 44 U/L Final   Alkaline Phosphatase 03/07/2021 70  38 - 126 U/L Final   Total Bilirubin 03/07/2021 0.5  0.3 - 1.2 mg/dL Final   GFR, Estimated 03/07/2021 >60  >60 mL/min Final   Comment: (NOTE) Calculated using the CKD-EPI Creatinine Equation (2021)    Anion gap 03/07/2021 9  5 - 15 Final   Performed at [facility], [address], [place], [place] [address]  Admission on 02/07/2021, Discharged on 02/07/2021  Component Date Value Ref Range Status   WBC 02/07/2021 8.6  4.0 - 10.5 K/uL Final   RBC 02/07/2021 4.38  4.22 - 5.81 MIL/uL Final   Hemoglobin 02/07/2021 13.8  13.0 - 17.0 g/dL Final   HCT 02/07/2021 40.6  39.0 - 52.0 % Final   MCV 02/07/2021 92.7  80.0 - 100.0 fL Final   MCH 02/07/2021 31.5  26.0 - 34.0 pg Final   MCHC 02/07/2021 34.0  30.0 - 36.0 g/dL Final   RDW 02/07/2021 12.3  11.5 - 15.5 %  Final   Platelets 02/07/2021 209  150 - 400 K/uL Final   nRBC 02/07/2021 0.0  0.0 - 0.2 % Final   Neutrophils Relative % 02/07/2021 76  % Final   Neutro Abs 02/07/2021 6.6  1.7 - 7.7 K/uL Final   Lymphocytes Relative 02/07/2021 13  % Final   Lymphs Abs 02/07/2021 1.1  0.7 - 4.0 K/uL Final   Monocytes Relative 02/07/2021 9  % Final   Monocytes Absolute 02/07/2021 0.8  0.1 - 1.0 K/uL Final   Eosinophils Relative 02/07/2021 1  % Final   Eosinophils Absolute 02/07/2021 0.0  0.0 - 0.5 K/uL Final   Basophils Relative 02/07/2021 1  % Final   Basophils Absolute 02/07/2021 0.1  0.0 - 0.1 K/uL Final   Immature Granulocytes 02/07/2021 0  % Final   Abs Immature Granulocytes 02/07/2021 0.02  0.00 - 0.07 K/uL Final   Performed at [facility], [place], [place] [address]   Sodium 02/07/2021 135  135 - 145 mmol/L Final   Potassium 02/07/2021 3.7  3.5 - 5.1 mmol/L Final   Chloride 02/07/2021 99  98 - 111 mmol/L Final   CO2 02/07/2021 25  22 - 32 mmol/L Final   Glucose, Bld 02/07/2021 99  70 - 99 mg/dL Final   Glucose reference range applies only to samples taken after fasting for at least 8 hours.   BUN 02/07/2021 <5 (A)  8 - 23 mg/dL Final   Creatinine, Ser 02/07/2021 0.68  0.61 - 1.24 mg/dL Final   Calcium 02/07/2021 9.3  8.9 - 10.3 mg/dL Final   Total Protein 02/07/2021 7.0  6.5 - 8.1 g/dL Final   Albumin 02/07/2021 4.4  3.5 - 5.0 g/dL Final   AST 02/07/2021 14 (A)  15 - 41 U/L Final   ALT 02/07/2021 10  0 - 44 U/L Final   Alkaline Phosphatase 02/07/2021 65  38 - 126 U/L Final   Total Bilirubin 02/07/2021 0.5  0.3 - 1.2 mg/dL Final   GFR, Estimated 02/07/2021 >60  >60 mL/min Final   Comment: (NOTE) Calculated using the CKD-EPI Creatinine Equation (2021)    Anion gap 02/07/2021 11  5 - 15 Final   Performed at [facility], [address] [place], [place] [address]   Lipase 02/07/2021 13  11 - 51 U/L Final   Performed at [facility], [address] [place], [place] [address]   Color, Urine 02/07/2021 COLORLESS (A)  YELLOW Final   APPearance 02/07/2021 CLEAR  CLEAR Final   Specific Gravity, Urine 02/07/2021 <1.005 (A)  1.005 - 1.030 Final   pH 02/07/2021 7.5  5.0 - 8.0 Final   Glucose, UA 02/07/2021 NEGATIVE  NEGATIVE mg/dL Final   Hgb urine dipstick 02/07/2021 NEGATIVE  NEGATIVE Final   Bilirubin Urine 02/07/2021 NEGATIVE  NEGATIVE Final   Ketones, ur 02/07/2021 NEGATIVE  NEGATIVE mg/dL Final   Protein, ur 02/07/2021 NEGATIVE  NEGATIVE mg/dL Final   Nitrite 02/07/2021 NEGATIVE  NEGATIVE Final   Leukocytes,Ua 02/07/2021 NEGATIVE  NEGATIVE Final   Performed at [facility], [address] [place], [place] [address]   SARS Coronavirus 2 by RT PCR 02/07/2021 NEGATIVE  NEGATIVE Final   Comment: (NOTE) SARS-CoV-2 target nucleic acids are NOT DETECTED.  The SARS-CoV-2 RNA is generally detectable in upper respiratory specimens during the acute phase of infection. The lowest concentration of SARS-CoV-2 viral copies this assay can detect is 138 copies/mL. A negative result does not preclude SARS-Cov-2 infection and should not be used as the sole basis for treatment or other patient management decisions. A negative result may occur with  improper specimen collection/handling, submission of specimen other than nasopharyngeal swab, presence of viral mutation(s) within the areas targeted by this assay, and inadequate number of viral copies(<138 copies/mL). A negative result must be combined with clinical observations, patient history, and epidemiological information. The expected result is Negative.  Fact Sheet for Patients:  EntrepreneurPulse.com.au  Fact Sheet for Healthcare Providers:  IncredibleEmployment.be  This test is no                          t yet approved or cleared by the [place] FDA and  has been authorized for  detection and/or diagnosis of SARS-CoV-2 by FDA under an Emergency Use Authorization (EUA). This EUA will remain  in effect (meaning this test can be used) for the duration of the COVID-19 declaration under Section 564(b)(1) of the Act, 21 U.S.C.section 360bbb-3(b)(1), unless the authorization is terminated  or revoked sooner.  Influenza A by PCR 02/07/2021 NEGATIVE  NEGATIVE Final   Influenza B by PCR 02/07/2021 NEGATIVE  NEGATIVE Final   Comment: (NOTE) The Xpert Xpress SARS-CoV-2/FLU/RSV plus assay is intended as an aid in the diagnosis of influenza from Nasopharyngeal swab specimens and should not be used as a sole basis for treatment. Nasal washings and aspirates are unacceptable for Xpert Xpress SARS-CoV-2/FLU/RSV testing.  Fact Sheet for Patients: EntrepreneurPulse.com.au  Fact Sheet for Healthcare Providers: IncredibleEmployment.be  This test is not yet approved or cleared by the [place] FDA and has been authorized for detection and/or diagnosis of SARS-CoV-2 by FDA under an Emergency Use Authorization (EUA). This EUA will remain in effect (meaning this test can be used) for the duration of the COVID-19 declaration under Section 564(b)(1) of the Act, 21 U.S.C. section 360bbb-3(b)(1), unless the authorization is terminated or revoked.  Performed at [facility], [address] [place],  [address]   Admission on 01/27/2021, Discharged on 01/27/2021  Component Date Value Ref Range Status   WBC 01/27/2021 5.7  4.0 - 10.5 K/uL Final   RBC 01/27/2021 4.34  4.22 - 5.81 MIL/uL Final   Hemoglobin 01/27/2021 13.6  13.0 - 17.0 g/dL Final   HCT 01/27/2021 39.0  39.0 - 52.0 % Final   MCV 01/27/2021 89.9  80.0 - 100.0 fL Final   MCH 01/27/2021 31.3  26.0 - 34.0 pg Final   MCHC 01/27/2021 34.9  30.0 - 36.0 g/dL Final   RDW 01/27/2021 12.0  11.5 - 15.5 % Final   Platelets 01/27/2021 286  150 - 400 K/uL  Final   nRBC 01/27/2021 0.0  0.0 - 0.2 % Final   Neutrophils Relative % 01/27/2021 67  % Final   Neutro Abs 01/27/2021 3.8  1.7 - 7.7 K/uL Final   Lymphocytes Relative 01/27/2021 22  % Final   Lymphs Abs 01/27/2021 1.3  0.7 - 4.0 K/uL Final   Monocytes Relative 01/27/2021 9  % Final   Monocytes Absolute 01/27/2021 0.5  0.1 - 1.0 K/uL Final   Eosinophils Relative 01/27/2021 1  % Final   Eosinophils Absolute 01/27/2021 0.1  0.0 - 0.5 K/uL Final   Basophils Relative 01/27/2021 1  % Final   Basophils Absolute 01/27/2021 0.0  0.0 - 0.1 K/uL Final   Immature Granulocytes 01/27/2021 0  % Final   Abs Immature Granulocytes 01/27/2021 0.01  0.00 - 0.07 K/uL Final   Performed at [facility], [address] [place], [place] [address]   Sodium 01/27/2021 133 (A)  135 - 145 mmol/L Final   Potassium 01/27/2021 3.6  3.5 - 5.1 mmol/L Final   Chloride 01/27/2021 97 (A)  98 - 111 mmol/L Final   CO2 01/27/2021 26  22 - 32 mmol/L Final   Glucose, Bld 01/27/2021 103 (A)  70 - 99 mg/dL Final   Glucose reference range applies only to samples taken after fasting for at least 8 hours.   BUN 01/27/2021 <5 (A)  8 - 23 mg/dL Final   Creatinine, Ser 01/27/2021 0.57 (A)  0.61 - 1.24 mg/dL Final   Calcium 01/27/2021 9.0  8.9 - 10.3 mg/dL Final   Total Protein 01/27/2021 7.0  6.5 - 8.1 g/dL Final   Albumin 01/27/2021 4.1  3.5 - 5.0 g/dL Final   AST 01/27/2021 14 (A)  15 - 41 U/L Final   ALT 01/27/2021 8  0 - 44 U/L Final   Alkaline Phosphatase 01/27/2021 60  38 - 126 U/L Final  Total Bilirubin 01/27/2021 0.6  0.3 - 1.2 mg/dL Final   GFR, Estimated 01/27/2021 >60  >60 mL/min Final   Comment: (NOTE) Calculated using the CKD-EPI Creatinine Equation (2021)    Anion gap 01/27/2021 10  5 - 15 Final   Performed at [facility], [address] [place], [place] [address]   Lipase 01/27/2021 22  11 - 51 U/L Final   Performed at [facility], [place], [place] [address]   Troponin I (High Sensitivity) 01/27/2021 3  <18 ng/L Final   Comment: (NOTE) Elevated high sensitivity troponin I (hsTnI) values and significant  changes across serial measurements may suggest ACS but many other  chronic and acute conditions are known to elevate hsTnI results.  Refer to the "Links" section for chest pain algorithms and additional  guidance. Performed at [facility], [address] [place], [place] [address]    SARS Coronavirus 2 by RT PCR 01/27/2021 NEGATIVE  NEGATIVE Final   Comment: (NOTE) SARS-CoV-2 target nucleic acids are NOT DETECTED.  The SARS-CoV-2 RNA is generally detectable in upper respiratory specimens during the acute phase of infection. The lowest concentration of SARS-CoV-2 viral copies this assay can detect is 138 copies/mL. A negative result does not preclude SARS-Cov-2 infection and should not be used as the sole basis for treatment or other patient management decisions. A negative result may occur with  improper specimen collection/handling, submission of specimen other than nasopharyngeal swab, presence of viral mutation(s) within the areas targeted by this assay, and inadequate number of viral copies(<138 copies/mL). A negative result must be combined with clinical observations, patient history, and epidemiological information. The expected result is Negative.  Fact Sheet for Patients:  EntrepreneurPulse.com.au  Fact Sheet for Healthcare Providers:  IncredibleEmployment.be  This test is no                          t yet approved or cleared by the [place] FDA and  has been authorized for detection and/or diagnosis of SARS-CoV-2 by FDA under an Emergency Use Authorization (EUA). This EUA will remain  in effect (meaning this test can be used) for the duration of the COVID-19 declaration under Section 564(b)(1) of the Act, 21 U.S.C.section  360bbb-3(b)(1), unless the authorization is terminated  or revoked sooner.       Influenza A by PCR 01/27/2021 NEGATIVE  NEGATIVE Final   Influenza B by PCR 01/27/2021 NEGATIVE  NEGATIVE Final   Comment: (NOTE) The Xpert Xpress SARS-CoV-2/FLU/RSV plus assay is intended as an aid in the diagnosis of influenza from Nasopharyngeal swab specimens and should not be used as a sole basis for treatment. Nasal washings and aspirates are unacceptable for Xpert Xpress SARS-CoV-2/FLU/RSV testing.  Fact Sheet for Patients: EntrepreneurPulse.com.au  Fact Sheet for Healthcare Providers: IncredibleEmployment.be  This test is not yet approved or cleared by the [place] FDA and has been authorized for detection and/or diagnosis of SARS-CoV-2 by FDA under an Emergency Use Authorization (EUA). This EUA will remain in effect (meaning this test can be used) for the duration of the COVID-19 declaration under Section 564(b)(1) of the Act, 21 U.S.C. section 360bbb-3(b)(1), unless the authorization is terminated or revoked.  Performed at [facility], [address], [place], [place] [address]   Admission on 01/13/2021, Discharged on 01/13/2021  Component Date Value Ref Range Status   Sodium 01/13/2021 133 (A)  135 - 145 mmol/L Final   Potassium 01/13/2021  3.6  3.5 - 5.1 mmol/L Final   Chloride 01/13/2021 96 (A)  98 - 111 mmol/L Final   CO2 01/13/2021 27  22 - 32 mmol/L Final   Glucose, Bld 01/13/2021 89  70 - 99 mg/dL Final   Glucose reference range applies only to samples taken after fasting for at least 8 hours.   BUN 01/13/2021 <5 (A)  8 - 23 mg/dL Final   Creatinine, Ser 01/13/2021 0.66  0.61 - 1.24 mg/dL Final   Calcium 01/13/2021 9.0  8.9 - 10.3 mg/dL Final   GFR, Estimated 01/13/2021 >60  >60 mL/min Final   Comment: (NOTE) Calculated using the CKD-EPI Creatinine Equation (2021)    Anion gap 01/13/2021 10  5 - 15 Final    Performed at [facility], [address] [place], [place] [address]   WBC 01/13/2021 7.9  4.0 - 10.5 K/uL Final   RBC 01/13/2021 4.13 (A)  4.22 - 5.81 MIL/uL Final   Hemoglobin 01/13/2021 13.1  13.0 - 17.0 g/dL Final   HCT 01/13/2021 37.7 (A)  39.0 - 52.0 % Final   MCV 01/13/2021 91.3  80.0 - 100.0 fL Final   MCH 01/13/2021 31.7  26.0 - 34.0 pg Final   MCHC 01/13/2021 34.7  30.0 - 36.0 g/dL Final   RDW 01/13/2021 12.1  11.5 - 15.5 % Final   Platelets 01/13/2021 291  150 - 400 K/uL Final   nRBC 01/13/2021 0.0  0.0 - 0.2 % Final   Neutrophils Relative % 01/13/2021 70  % Final   Neutro Abs 01/13/2021 5.6  1.7 - 7.7 K/uL Final   Lymphocytes Relative 01/13/2021 17  % Final   Lymphs Abs 01/13/2021 1.4  0.7 - 4.0 K/uL Final   Monocytes Relative 01/13/2021 11  % Final   Monocytes Absolute 01/13/2021 0.8  0.1 - 1.0 K/uL Final   Eosinophils Relative 01/13/2021 1  % Final   Eosinophils Absolute 01/13/2021 0.0  0.0 - 0.5 K/uL Final   Basophils Relative 01/13/2021 1  % Final   Basophils Absolute 01/13/2021 0.1  0.0 - 0.1 K/uL Final   Immature Granulocytes 01/13/2021 0  % Final   Abs Immature Granulocytes 01/13/2021 0.02  0.00 - 0.07 K/uL Final   Performed at [facility], [address], [place], [place] [address]  Admission on 01/11/2021, Discharged on 01/11/2021  Component Date Value Ref Range Status   Lipase 01/11/2021 20  11 - 51 U/L Final   Performed at [facility], [address], [place], [place] [address]   Sodium 01/11/2021 133 (A)  135 - 145 mmol/L Final   Potassium 01/11/2021 3.9  3.5 - 5.1 mmol/L Final   Chloride 01/11/2021 96 (A)  98 - 111 mmol/L Final   CO2 01/11/2021 27  22 - 32 mmol/L Final   Glucose, Bld 01/11/2021 118 (A)  70 - 99 mg/dL Final   Glucose reference range applies only to samples taken after fasting for at least 8 hours.   BUN 01/11/2021 7 (A)  8 - 23 mg/dL Final   Creatinine, Ser 01/11/2021 0.73  0.61  - 1.24 mg/dL Final   Calcium 01/11/2021 9.0  8.9 - 10.3 mg/dL Final   Total Protein 01/11/2021 7.0  6.5 - 8.1 g/dL Final   Albumin 01/11/2021 4.4  3.5 - 5.0 g/dL Final   AST 01/11/2021 15  15 - 41 U/L Final   ALT 01/11/2021 9  0 - 44 U/L Final   Alkaline Phosphatase 01/11/2021 61  38 -  126 U/L Final   Total Bilirubin 01/11/2021 0.4  0.3 - 1.2 mg/dL Final   GFR, Estimated 01/11/2021 >60  >60 mL/min Final   Comment: (NOTE) Calculated using the CKD-EPI Creatinine Equation (2021)    Anion gap 01/11/2021 10  5 - 15 Final   Performed at [facility], [address] [place], [place] [address]   WBC 01/11/2021 8.1  4.0 - 10.5 K/uL Final   RBC 01/11/2021 4.21 (A)  4.22 - 5.81 MIL/uL Final   Hemoglobin 01/11/2021 13.3  13.0 - 17.0 g/dL Final   HCT 01/11/2021 38.9 (A)  39.0 - 52.0 % Final   MCV 01/11/2021 92.4  80.0 - 100.0 fL Final   MCH 01/11/2021 31.6  26.0 - 34.0 pg Final   MCHC 01/11/2021 34.2  30.0 - 36.0 g/dL Final   RDW 01/11/2021 12.3  11.5 - 15.5 % Final   Platelets 01/11/2021 294  150 - 400 K/uL Final   nRBC 01/11/2021 0.0  0.0 - 0.2 % Final   Performed at [facility], [address] [place], [place] [address]   Color, Urine 01/11/2021 COLORLESS (A)  YELLOW Final   APPearance 01/11/2021 CLEAR  CLEAR Final   Specific Gravity, Urine 01/11/2021 1.009  1.005 - 1.030 Final   pH 01/11/2021 7.0  5.0 - 8.0 Final   Glucose, UA 01/11/2021 NEGATIVE  NEGATIVE mg/dL Final   Hgb urine dipstick 01/11/2021 NEGATIVE  NEGATIVE Final   Bilirubin Urine 01/11/2021 NEGATIVE  NEGATIVE Final   Ketones, ur 01/11/2021 NEGATIVE  NEGATIVE mg/dL Final   Protein, ur 01/11/2021 NEGATIVE  NEGATIVE mg/dL Final   Nitrite 01/11/2021 NEGATIVE  NEGATIVE Final   Leukocytes,Ua 01/11/2021 NEGATIVE  NEGATIVE Final   Performed at [facility], [address] [place], [place] [address]   Alcohol, Ethyl (B) 01/11/2021 <10  <10 mg/dL Final   Comment:  (NOTE) Lowest detectable limit for serum alcohol is 10 mg/dL.  For medical purposes only. Performed at [facility], [address] [place], [place] [address]    Magnesium 01/11/2021 1.8  1.7 - 2.4 mg/dL Final   Performed at [facility], [address] [place], [place] [address]   Sed Rate 01/11/2021 10  0 - 16 mm/hr Final   Performed at [facility], [address] [place],  [address]  Admission on 01/05/2021, Discharged on 01/05/2021  Component Date Value Ref Range Status   Sodium 01/05/2021 135  135 - 145 mmol/L Final   Potassium 01/05/2021 3.9  3.5 - 5.1 mmol/L Final   Chloride 01/05/2021 98  98 - 111 mmol/L Final   CO2 01/05/2021 27  22 - 32 mmol/L Final   Glucose, Bld 01/05/2021 98  70 - 99 mg/dL Final   Glucose reference range applies only to samples taken after fasting for at least 8 hours.   BUN 01/05/2021 6 (A)  8 - 23 mg/dL Final   Creatinine, Ser 01/05/2021 0.73  0.61 - 1.24 mg/dL Final   Calcium 01/05/2021 9.3  8.9 - 10.3 mg/dL Final   Total Protein 01/05/2021 7.1  6.5 - 8.1 g/dL Final   Albumin 01/05/2021 4.2  3.5 - 5.0 g/dL Final   AST 01/05/2021 16  15 - 41 U/L Final   ALT 01/05/2021 8  0 - 44 U/L Final   Alkaline Phosphatase 01/05/2021 58  38 - 126 U/L Final   Total Bilirubin 01/05/2021 0.5  0.3 - 1.2 mg/dL Final   GFR, Estimated 01/05/2021 >60  >60  mL/min Final   Comment: (NOTE) Calculated using the CKD-EPI Creatinine Equation (2021)    Anion gap 01/05/2021 10  5 - 15 Final   Performed at [facility], [address] [place], [place] [address]   WBC 01/05/2021 6.0  4.0 - 10.5 K/uL Final   RBC 01/05/2021 4.15 (A)  4.22 - 5.81 MIL/uL Final   Hemoglobin 01/05/2021 13.1  13.0 - 17.0 g/dL Final   HCT 01/05/2021 38.5 (A)  39.0 - 52.0 % Final   MCV 01/05/2021 92.8  80.0 - 100.0 fL Final   MCH 01/05/2021 31.6  26.0 - 34.0 pg Final   MCHC 01/05/2021 34.0  30.0 - 36.0 g/dL Final    RDW 01/05/2021 12.5  11.5 - 15.5 % Final   Platelets 01/05/2021 267  150 - 400 K/uL Final   nRBC 01/05/2021 0.0  0.0 - 0.2 % Final   Neutrophils Relative % 01/05/2021 43  % Final   Neutro Abs 01/05/2021 2.6  1.7 - 7.7 K/uL Final   Lymphocytes Relative 01/05/2021 44  % Final   Lymphs Abs 01/05/2021 2.7  0.7 - 4.0 K/uL Final   Monocytes Relative 01/05/2021 10  % Final   Monocytes Absolute 01/05/2021 0.6  0.1 - 1.0 K/uL Final   Eosinophils Relative 01/05/2021 2  % Final   Eosinophils Absolute 01/05/2021 0.1  0.0 - 0.5 K/uL Final   Basophils Relative 01/05/2021 1  % Final   Basophils Absolute 01/05/2021 0.1  0.0 - 0.1 K/uL Final   Immature Granulocytes 01/05/2021 0  % Final   Abs Immature Granulocytes 01/05/2021 0.01  0.00 - 0.07 K/uL Final   Performed at [facility], [address], [place], [place] [address]   Color, Urine 01/05/2021 YELLOW  YELLOW Final   APPearance 01/05/2021 CLEAR  CLEAR Final   Specific Gravity, Urine 01/05/2021 1.009  1.005 - 1.030 Final   pH 01/05/2021 6.5  5.0 - 8.0 Final   Glucose, UA 01/05/2021 NEGATIVE  NEGATIVE mg/dL Final   Hgb urine dipstick 01/05/2021 TRACE (A)  NEGATIVE Final   Bilirubin Urine 01/05/2021 NEGATIVE  NEGATIVE Final   Ketones, ur 01/05/2021 NEGATIVE  NEGATIVE mg/dL Final   Protein, ur 01/05/2021 NEGATIVE  NEGATIVE mg/dL Final   Nitrite 01/05/2021 NEGATIVE  NEGATIVE Final   Leukocytes,Ua 01/05/2021 NEGATIVE  NEGATIVE Final   RBC / HPF 01/05/2021 0-5  0 - 5 RBC/hpf Final   WBC, UA 01/05/2021 0-5  0 - 5 WBC/hpf Final   Mucus 01/05/2021 PRESENT   Final   Performed at [facility], [address] [place], [place] [address]  Admission on 12/24/2020, Discharged on 12/24/2020  Component Date Value Ref Range Status   WBC 12/24/2020 7.4  4.0 - 10.5 K/uL Final   RBC 12/24/2020 4.09 (A)  4.22 - 5.81 MIL/uL Final   Hemoglobin 12/24/2020 12.8 (A)  13.0 - 17.0 g/dL Final   HCT 12/24/2020 37.8 (A)  39.0 - 52.0  % Final   MCV 12/24/2020 92.4  80.0 - 100.0 fL Final   MCH 12/24/2020 31.3  26.0 - 34.0 pg Final   MCHC 12/24/2020 33.9  30.0 - 36.0 g/dL Final   RDW 12/24/2020 12.5  11.5 - 15.5 % Final   Platelets 12/24/2020 269  150 - 400 K/uL Final   nRBC 12/24/2020 0.0  0.0 - 0.2 % Final   Performed at [facility], [address] [place], [place] [address]   Sodium 12/24/2020 137  135 - 145 mmol/L Final  Potassium 12/24/2020 3.8  3.5 - 5.1 mmol/L Final   Chloride 12/24/2020 101  98 - 111 mmol/L Final   CO2 12/24/2020 28  22 - 32 mmol/L Final   Glucose, Bld 12/24/2020 93  70 - 99 mg/dL Final   Glucose reference range applies only to samples taken after fasting for at least 8 hours.   BUN 12/24/2020 5 (A)  8 - 23 mg/dL Final   Creatinine, Ser 12/24/2020 0.58 (A)  0.61 - 1.24 mg/dL Final   Calcium 12/24/2020 8.7 (A)  8.9 - 10.3 mg/dL Final   GFR, Estimated 12/24/2020 >60  >60 mL/min Final   Comment: (NOTE) Calculated using the CKD-EPI Creatinine Equation (2021)    Anion gap 12/24/2020 8  5 - 15 Final   Performed at [facility], [address], [place], [place] [address]  Admission on 12/18/2020, Discharged on 12/18/2020  Component Date Value Ref Range Status   Sodium 12/18/2020 131 (A)  135 - 145 mmol/L Final   Potassium 12/18/2020 3.9  3.5 - 5.1 mmol/L Final   Chloride 12/18/2020 94 (A)  98 - 111 mmol/L Final   CO2 12/18/2020 27  22 - 32 mmol/L Final   Glucose, Bld 12/18/2020 117 (A)  70 - 99 mg/dL Final   Glucose reference range applies only to samples taken after fasting for at least 8 hours.   BUN 12/18/2020 5 (A)  8 - 23 mg/dL Final   Creatinine, Ser 12/18/2020 0.73  0.61 - 1.24 mg/dL Final   Calcium 12/18/2020 9.2  8.9 - 10.3 mg/dL Final   Total Protein 12/18/2020 7.1  6.5 - 8.1 g/dL Final   Albumin 12/18/2020 4.5  3.5 - 5.0 g/dL Final   AST 12/18/2020 18  15 - 41 U/L Final   ALT 12/18/2020 11  0 - 44 U/L Final   Alkaline Phosphatase 12/18/2020  61  38 - 126 U/L Final   Total Bilirubin 12/18/2020 0.4  0.3 - 1.2 mg/dL Final   GFR, Estimated 12/18/2020 >60  >60 mL/min Final   Comment: (NOTE) Calculated using the CKD-EPI Creatinine Equation (2021)    Anion gap 12/18/2020 10  5 - 15 Final   Performed at [facility], [address] [place], [place] [address]   WBC 12/18/2020 13.5 (A)  4.0 - 10.5 K/uL Final   RBC 12/18/2020 4.20 (A)  4.22 - 5.81 MIL/uL Final   Hemoglobin 12/18/2020 13.2  13.0 - 17.0 g/dL Final   HCT 12/18/2020 38.6 (A)  39.0 - 52.0 % Final   MCV 12/18/2020 91.9  80.0 - 100.0 fL Final   MCH 12/18/2020 31.4  26.0 - 34.0 pg Final   MCHC 12/18/2020 34.2  30.0 - 36.0 g/dL Final   RDW 12/18/2020 12.3  11.5 - 15.5 % Final   Platelets 12/18/2020 271  150 - 400 K/uL Final   nRBC 12/18/2020 0.0  0.0 - 0.2 % Final   Performed at [facility], [address] [place], [place] [address]  There may be more visits with results that are not included.    Allergies: Diphenhydramine hcl, Flexeril [cyclobenzaprine], Hctz [hydrochlorothiazide], Nsaids, Gabapentin, and Hydroxyzine  PTA Medications: (Not in a hospital admission)   Medical Decision Making  65 year old male with history of depression and substance use who presented to [facility] ED with suicidal ideations.  Patient was medically cleared and initially psychiatrically cleared in the ED; however, upon discharge patient reported suicidal thoughts with a plan to slit his wrists as he is  concerned about losing housing. He is unable to contract for safety..  He had reported drinking and presented with alcohol withdrawal symptoms to the ED initially.  Patient was recommended to the [facility] for further treatment to assist with alcohol detox.  Upon arrival, patient was noted to ambulate with a walker and appeared to need more assistance than could be provided with ambulation at the [facility].  Additionally, patient would have to be placed on alcohol  withdrawal protocol. The decision was made to admit patient to the [facility] vs [facility] for safety.  With the set up of the [facility], it is easier to observe patient for safety-patient would be easily visible on the open unit (vs being in a room by himself ) which will decrease risk of falling given difficulty with ambulation and need to be on alcohol withdrawal protocol while search placement in geripsych facility occurs.  Restarted home medications apart from benzodiazepines and placed on detox protocol.  We will seek geri psych placement.    Recommendations  Patient medically cleared in the ED prior to transfer-does not appear to have emergency medical problems at this time  Ival Bible, MD 03/31/21  5:08 PM

## 2021-03-31 NOTE — ED Notes (Signed)
Breakfast tray given. °

## 2021-03-31 NOTE — ED Notes (Signed)
Patient requesting Ativan stating, "I go to sleep early."   Patient reminded that Ativan would be given at scheduled time if needed. Patient stated,  "I'm just going to wait for my Ativan then I'll go to sleep."

## 2021-03-31 NOTE — Progress Notes (Signed)
Pt was accepted to [facility] tomorrow 04/01/21 after 0800am. Pt will admit to [facility] and the phone number for report is [phone].  Pt meets inpatient criteria per Dr. Serafina Mitchell  Attending Physician will be Dr. Kathlene Cote  Report can be called to: - [phone]  Pt can arrive after 0800am on 04/01/21  Care Team notified via secure chat: Chatlott Knouse, RN. CSW requested that nursing coordinate transportation.    Nadara Mode, LCSWA 03/31/2021 @ 10:56 PM

## 2021-03-31 NOTE — ED Notes (Signed)
Patient requesting Ativan and reports he will "not be able to sleep without it."  Patient began shaking at nurse's station stating, "I'm shaking because I'm nervous."  Writer redirected patient back to room and informed patient Ativan would be given in 2 hours if order parameters met. 

## 2021-03-31 NOTE — Progress Notes (Signed)
Pt pleasant and cooperative with care.  He does seem to attention seek but mostly for conversation.  He denies suicidal ideation, homicidal ideation and A/V hallucinations.  He reports that he thinks he "just needed a little conversation and some guidance."  Pt compliant with mediations as ordered.  He requests a flu shot before he leaves in the morning.

## 2021-03-31 NOTE — ED Notes (Signed)
Patient requesting Ambien stating, "I will not be able to sleep without it."  Patient informed medication was discontinued by provider.

## 2021-03-31 NOTE — Progress Notes (Signed)
Pt assisted to restroom and then to bed with an extra blanket to address his request for a shirt from his locker.  Pt assisted to bed and covers pulled over him.  He was provided with a box of kleenex beside him per request.  Pt was educated on risk of fall and verbally agrees to ask for assistance out of his bed for the next few hours due to taking his scheduled medications.

## 2021-03-31 NOTE — Progress Notes (Signed)
Geriatric Inpatient Behavioral Health Placement  Meets inpatient criteria per Dr. Serafina Mitchell.  Referral was sent to the following facilities:  Destination Service Provider Address Phone [facility]  [address], [place] [address] [phone] [facility]  [address] [place] [address] [phone] [place], [place] [address] [place]  [facility]  [address] [place] [address] [phone] [facility]  [place], [place] [phone]  [facility]  [address] [place] [address] [phone]  [facility]  [address] [place] [address] [phone] [facility]  [address] [place] [address] [phone] [place]  [address] [place] [address] [phone] [facility]  [address] [place] [address] [phone]  [facility]         Situation ongoing,  CSW will follow up.   Benjaman Kindler, MSW, LCSWA 03/31/2021  @ 8:17 PM

## 2021-04-01 NOTE — Progress Notes (Signed)
Patient discharged home via safe transport.  Given all valuables and discharge instructions.  Denied avh shi or plan.

## 2021-04-01 NOTE — ED Notes (Signed)
Patient resting - no sxs of distress - will continue to monitor for safety

## 2021-04-01 NOTE — ED Notes (Signed)
Patient restless - asking about medication - provider made aware - also reminded patient to use walker many times and he refuses - will continue to monitor for safety

## 2021-04-01 NOTE — ED Notes (Signed)
Patient is awake resting in bed.  He is focused on discharge and going home to pay his bills.  He is also anxious about getting his prescriptions and a flu shot.  He denies avh shi or plan.  No current withdrawal symptoms and CIWA is 0.  Patient is utilizing walker and encouraged to call for assistance if he is dizzy or unsteady.  Patient is better organized this morning and able to make needs known.  Will monitor and provide safe supportive environment.

## 2021-04-01 NOTE — ED Provider Notes (Signed)
FBC/OBS ASAP Discharge Summary  Date and Time: 04/01/2021 8:33 AM  Name: Anthony Lamb  MRN:  [id number]   Discharge Diagnoses:  Final diagnoses:  Substance induced mood disorder ([place])    Subjective: Anthony Lamb reports readiness to discharge home.  He states "I am ready to go home, I have things I need to do including get my money at the first of the month so that I can make sure I do not lose my apartment."  He is alert and oriented, pleasant and cooperative during assessment.  He is seated in observation area, no obvious sign of distress.   He denies suicidal or homicidal ideations today.  He contracts verbally for safety with this Probation officer.  He denies both auditory and visual hallucinations, denies symptoms of paranoia.  There is no evidence of delusional thought content and no indication that patient is responding to internal stimuli.  He presents with euthymic mood and congruent affect today.  He is insightful regarding his alcohol use.  He states "I am not going to drink anymore, my last drink was 5 days ago, I will not buy beer anymore."  Anthony Lamb is not interested in residential substance use treatment at this time.  He states "I can do this on my own."  Anthony Lamb has been diagnosed with major depressive disorder adjustment disorder and dementia.  He reports compliance with current medications including duloxetine and Rexulti.  These medications are prescribed by primary care provider at [facility].  He sees outpatient counseling through [facility] however would prefer to seek counseling more frequently.  Patient offered support and encouragement.  He declines any person to call for collateral information at this time.  Verbalizes appreciation for care and request transportation home.  He uses walker for ambulation and is not comfortable attempting to navigate the public bus.  Stay Summary: H&P from 03/31/2021 at 1643pm: HPI:  65 year old male with a history of alcohol  use, MDD and benzodiazepine use who presented to [facility] ED for suicidal ideation.  Patient was evaluated in the ED by psychiatry and initially was able to contract for safety and was going to be discharged; however, on discharge patient reported he was suicidal with a plan to slit his wrists.  Patient was recommended for [facility] for further treatment.  Upon arrival patient was noted to ambulate with a walker.  Per chart review patient has a history of substance use which she admits to and it was determined it would be safest to admit patient to the [facility] for observation as he will be need to placed on alcohol withdrawal protocol and patient safety would be most easily maintained in the observation unit while  placement an geripsych facility is searched. On interview patient appears anxious.  He states that he presented to the hospital initially because "I got a lot of health problems" and that he was concerned about losing his housing as he was worried he is worried he would not receive his money on November 1.  Patient states that he called a phone number and that it usually says that the deposit is coming in; however, he did not receive that message so he is concerned that he may not receive it.  Patient states that this has never happened before so he is not sure if he will receive the money.  Patient continues to be unable to contract for safety.  He describes his mood is "not real good".  Patient states that he has not been able to  sleep for several days.  He denies HI he denies AVH.  Patient reports that he has been drinking approximately 50 ounces of beer daily with his last drink about 5 days ago.  Per chart review patient has received Ativan while in the ED he reports current withdrawal symptoms of anxiety and tremors.  Patient expresses concern about receiving his medications.  Per chart review patient had reported to psych NP in the ED that he had throat cancer; however, patient did not report this on  interview today.  On chart review there is no evidence of any throat cancer that he has been diagnosed with.  Patient reports previous medication trials of duloxetine and Rexulti however he states that these medications have not been helpful.  Patient is focused on receiving his Ativan and Seroquel.  He reports 2 previous psychiatric hospitalizations when at [facility] and 1 in [place].  Patient states on both occasions reason for hospitalization with suicidal thoughts.  He denies previous suicide attempts.  Patient states that he sees a psychiatrist through [facility] and is seen within the last month.  Patient has not married he has no children he receives disability as a source of income.  He highest level of education is a high school diploma.  Patient states he lives in an apartment.  He denies access to a gun.  Patient reports a family history of anxiety in his mother.     Total Time spent with patient: 20 minutes  Past Psychiatric History: Major depressive disorder, substance-induced mood disorder, adjustment disorder, alcohol abuse, dementia Past Medical History:  Past Medical History:  Diagnosis Date   Alcohol abuse, in remission    Anal fissure    Anemia    Ankylosing spondylitis (HCC)    Anxiety    Arthritis    Chronic diarrhea    Chronic headaches    Chronic pain    Colitis    Colon polyps    Depression    Esophagitis    Gastric polyps    hyperplastic and fundic gland   Gastritis    GERD (gastroesophageal reflux disease)    Hypertension    IBS (irritable bowel syndrome)    Poor dentition    SIADH (syndrome of inappropriate ADH production) ([place])     Past Surgical History:  Procedure Laterality Date   BIOPSY  11/26/2018   Procedure: BIOPSY;  Surgeon: Gatha Mayer, MD;  Location: [facility];  Service: Endoscopy;;   COLONOSCOPY W/ BIOPSIES     COLONOSCOPY WITH PROPOFOL N/A 11/26/2018   Procedure: COLONOSCOPY WITH PROPOFOL;  Surgeon: Gatha Mayer, MD;  Location:  [facility];  Service: Endoscopy;  Laterality: N/A;   ESOPHAGOGASTRODUODENOSCOPY     ESOPHAGOGASTRODUODENOSCOPY (EGD) WITH PROPOFOL N/A 11/26/2018   Procedure: ESOPHAGOGASTRODUODENOSCOPY (EGD) WITH PROPOFOL;  Surgeon: Gatha Mayer, MD;  [facility]: [facility];  Service: Endoscopy;  Laterality: N/A;   HEMOSTASIS CLIP PLACEMENT  11/26/2018   Procedure: HEMOSTASIS CLIP PLACEMENT;  Surgeon: Gatha Mayer, MD;  [facility]: [facility];  Service: Endoscopy;;   HERNIA REPAIR Bilateral    HOT HEMOSTASIS N/A 11/26/2018   Procedure: HOT HEMOSTASIS (ARGON PLASMA COAGULATION/BICAP);  Surgeon: Gatha Mayer, MD;  [facility];  Service: Endoscopy;  Laterality: N/A;   OPEN REDUCTION INTERNAL FIXATION (ORIF) DISTAL RADIAL FRACTURE Right 02/11/2018   Procedure: OPEN REDUCTION INTERNAL FIXATION (ORIF) DISTAL RADIAL FRACTURE;  Surgeon: Milly Jakob, MD;  Location: [place];  Service: Orthopedics;  Laterality: Right;   POLYPECTOMY  11/26/2018  Procedure: POLYPECTOMY;  Surgeon: Gatha Mayer, MD;  Location: [facility];  Service: Endoscopy;;   TONSILLECTOMY     Family History:  Family History  Problem Relation Age of Onset   Anxiety disorder Mother    Congestive Heart Failure Mother    Crohn's disease Mother    Colon cancer Mother 93   Arthritis Father    High blood pressure Father    Crohn's disease Father    Family Psychiatric History:  Social History:  Social History   Substance and Sexual Activity  Alcohol Use Yes   Comment: 1 25 oz of beer a night     Social History   Substance and Sexual Activity  Drug Use Yes   Types: Marijuana   Comment: other night    Social History   Socioeconomic History   Marital status: Single    Spouse name: Not on file   Number of children: 0   Years of education: Not on file   Highest education level: Not on file  Occupational History   Not on file  Tobacco Use   Smoking status: Former    Types: Cigarettes    Quit date: 2014    Years  since quitting: 8.8   Smokeless tobacco: Never  Vaping Use   Vaping Use: Never used  Substance and Sexual Activity   Alcohol use: Yes    Comment: 1 25 oz of beer a night   Drug use: Yes    Types: Marijuana    Comment: other night   Sexual activity: Not Currently  Other Topics Concern   Not on file  Social History Narrative   HSG. Long - term disability - unable to work. Lived with his mother in her house - she died 05-07-2023 -    Lives in apartment   Has case worker   Social Determinants of Radio broadcast assistant Strain: Not on file  Food Insecurity: Not on file  Transportation Needs: Not on file  Physical Activity: Not on file  Stress: Not on file  Social Connections: Not on file   SDOH:  SDOH Screenings   Alcohol Screen: Not on file  Depression (PHQ2-9): Not on file  Financial Resource Strain: Not on file  Food Insecurity: Not on file  Housing: Not on file  Physical Activity: Not on file  Social Connections: Not on file  Stress: Not on file  Tobacco Use: Medium Risk   Smoking Tobacco Use: Former   Smokeless Tobacco Use: Never   Passive Exposure: Not on file  Transportation Needs: Not on file    Tobacco Cessation:  A prescription for an FDA-approved tobacco cessation medication was offered at discharge and the patient refused  Current Medications:  Current Facility-Administered Medications  Medication Dose Route Frequency Provider Last Rate Last Admin   acetaminophen (TYLENOL) tablet 650 mg  650 mg Oral Q4H PRN Suella Broad, FNP   650 mg at 03/31/21 2008   alum & mag hydroxide-simeth (MAALOX/MYLANTA) 200-200-20 MG/5ML suspension 30 mL  30 mL Oral Q4H PRN Ival Bible, MD       feeding supplement (ENSURE ENLIVE / ENSURE PLUS) liquid 237 mL  237 mL Oral BID BM Ival Bible, MD       lisinopril (ZESTRIL) tablet 10 mg  10 mg Oral QHS Suella Broad, FNP   10 mg at 03/31/21 2132   loperamide (IMODIUM) capsule 2-4 mg  2-4 mg Oral  PRN Ival Bible, MD  LORazepam (ATIVAN) tablet 1 mg  1 mg Oral Q6H PRN Ival Bible, MD   1 mg at 04/01/21 0341   magnesium hydroxide (MILK OF MAGNESIA) suspension 30 mL  30 mL Oral Daily PRN Ival Bible, MD       multivitamin with minerals tablet 1 tablet  1 tablet Oral Daily Ival Bible, MD   1 tablet at 03/31/21 1728   ondansetron (ZOFRAN-ODT) disintegrating tablet 4 mg  4 mg Oral Q8H PRN Suella Broad, FNP   4 mg at 03/31/21 2153   pantoprazole (PROTONIX) EC tablet 40 mg  40 mg Oral Daily Starkes-Perry, Gayland Curry, FNP       QUEtiapine (SEROQUEL) tablet 300 mg  300 mg Oral QHS Suella Broad, FNP   300 mg at 03/31/21 2132   sodium chloride (OCEAN) 0.65 % nasal spray 2 spray  2 spray Each Nare QID PRN Suella Broad, FNP       sucralfate (CARAFATE) tablet 1 g  1 g Oral QID Suella Broad, FNP   1 g at 03/31/21 2201   thiamine tablet 100 mg  100 mg Oral Daily Ival Bible, MD       traZODone (DESYREL) tablet 50 mg  50 mg Oral QHS PRN Ival Bible, MD   50 mg at 04/01/21 3300   Current Outpatient Medications  Medication Sig Dispense Refill   clonazePAM (KLONOPIN) 1 MG tablet Take 1 mg by mouth 3 (three) times daily as needed.     DULoxetine (CYMBALTA) 30 MG capsule Take 60 mg by mouth daily.     lisinopril (PRINIVIL,ZESTRIL) 10 MG tablet Take 1 tablet (10 mg total) by mouth daily. (Patient taking differently: Take 10 mg by mouth at bedtime.) 30 tablet 0   Multiple Vitamin (MULTIVITAMIN WITH MINERALS) TABS tablet Take 1 tablet by mouth daily. (May buy over the counter) 1 tablet 0   ondansetron (ZOFRAN ODT) 4 MG disintegrating tablet Take 1 tablet (4 mg total) by mouth every 4 (four) hours as needed for nausea or vomiting. 20 tablet 0   pantoprazole (PROTONIX) 40 MG tablet Take 1 tablet (40 mg total) by mouth daily. 30 tablet 11   sucralfate (CARAFATE) 1 GM/10ML suspension Take 10 mLs (1 g total) by mouth 4  (four) times daily -  with meals and at bedtime. 420 mL 0   tiZANidine (ZANAFLEX) 4 MG tablet Take 2 mg by mouth 3 (three) times daily as needed for muscle spasms.     verapamil (CALAN-SR) 240 MG CR tablet Take 240 mg by mouth daily.  2    PTA Medications: (Not in a hospital admission)   Musculoskeletal  Strength & Muscle Tone: within normal limits Gait & Station:  Uses a walker for assistance with ambulation Patient leans: N/A  Psychiatric Specialty Exam  Presentation  General Appearance: Appropriate for Environment; Casual  Eye Contact:Good  Speech:Clear and Coherent; Normal Rate  Speech Volume:Normal  Handedness:Right   Mood and Affect  Mood:Euthymic  Affect:Appropriate; Congruent   Thought Process  Thought Processes:Coherent; Goal Directed; Linear  Descriptions of Associations:Intact  Orientation:Full (Time, Place and Person)  Thought Content:Logical; WDL     Hallucinations:Hallucinations: None  Ideas of Reference:None  Suicidal Thoughts:Suicidal Thoughts: No SI Passive Intent and/or Plan: Without Plan  Homicidal Thoughts:Homicidal Thoughts: No   Sensorium  Memory:Immediate Good; Recent Good; Remote Good  Judgment:Good  Insight:Fair   Executive Functions  Concentration:Good  Attention Span:Good  [place]  Psychomotor Activity  Psychomotor Activity:Psychomotor Activity: Normal   Assets  Assets:Communication Skills; Desire for Improvement; Financial Resources/Insurance; Intimacy; Housing; Leisure Time; Resilience   Sleep  Sleep:Sleep: Fair   No data recorded  Physical Exam  Physical Exam Vitals and nursing note reviewed.  Constitutional:      Appearance: Normal appearance. He is well-developed and normal weight.  HENT:     Head: Normocephalic and atraumatic.     Nose: Nose normal.  Cardiovascular:     Rate and Rhythm: Normal rate.  Pulmonary:     Effort: Pulmonary effort is  normal.  Musculoskeletal:     Cervical back: Normal range of motion.  Skin:    General: Skin is warm and dry.  Neurological:     Mental Status: He is alert and oriented to person, place, and time.  Psychiatric:        Attention and Perception: Attention and perception normal.        Mood and Affect: Mood and affect normal.        Speech: Speech normal.        Behavior: Behavior normal. Behavior is cooperative.        Thought Content: Thought content normal.        Cognition and Memory: Cognition and memory normal.        Judgment: Judgment normal.   Review of Systems  Constitutional: Negative.   HENT: Negative.    Eyes: Negative.   Respiratory: Negative.    Cardiovascular: Negative.   Gastrointestinal: Negative.   Genitourinary: Negative.   Musculoskeletal: Negative.   Skin: Negative.   Neurological: Negative.   Endo/Heme/Allergies: Negative.   Psychiatric/Behavioral:  Positive for substance abuse.   Blood pressure (!) 142/80, pulse 77, temperature 98.4 F (36.9 C), temperature source Oral, resp. rate 18, SpO2 96 %. There is no height or weight on file to calculate BMI.  Demographic Factors:  Male, Caucasian, and Living alone  Loss Factors: NA  Historical Factors: NA  Risk Reduction Factors:   Positive social support, Positive therapeutic relationship, and Positive coping skills or problem solving skills  Continued Clinical Symptoms:  Alcohol/Substance Abuse/Dependencies Previous Psychiatric Diagnoses and Treatments  Cognitive Features That Contribute To Risk:  None    Suicide Risk:  Minimal: No identifiable suicidal ideation.  Patients presenting with no risk factors but with morbid ruminations; may be classified as minimal risk based on the severity of the depressive symptoms  Plan Of Care/Follow-up recommendations:  Patient reviewed with Dr. Serafina Mitchell. Follow-up with established outpatient psychiatry. Follow-up with outpatient counseling at [facility]  behavioral health. Continue current medications. Follow-up with substance use treatment options provided.  Disposition: Discharge  Lucky Rathke, FNP 04/01/2021, 8:33 AM

## 2021-04-01 NOTE — Discharge Instructions (Addendum)
Patient is instructed prior to discharge to:  Take all medications as prescribed by his/her mental healthcare provider. Report any adverse effects and or reactions from the medicines to his/her outpatient provider promptly. Keep all scheduled appointments, to ensure that you are getting refills on time and to avoid any interruption in your medication.  If you are unable to keep an appointment call to reschedule.  Be sure to follow-up with resources and follow-up appointments provided.  Patient has been instructed & cautioned: To not engage in alcohol and or illegal drug use while on prescription medicines. In the event of worsening symptoms, patient is instructed to call the crisis hotline, 911 and or go to the nearest ED for appropriate evaluation and treatment of symptoms. To follow-up with his/her primary care provider for your other medical issues, concerns and or health care needs.    Substance Abuse Treatment Resources listed Below:  [facility] - Admissions are currently completed Monday through Friday at [place]; both appointments and walk-ins are accepted.  Any individual that is a [facility] resident may present for a substance abuse screening and assessment for admission.  A person may be referred by numerous sources or self-refer.   Potential clients will be screened for medical necessity and appropriateness for the program.  Clients must meet criteria for high-intensity residential treatment services.  If clinically appropriate, a client will continue with the comprehensive clinical assessment and intake process, as well as enrollment in the [place].  Address: [address] [place], [place] [address] Admin Hours: [place] to [place] Hours: 24/7 Phone: [phone] Fax: [place] Address: [place], [place], [place] [address] Behavioral Health Urgent Care [facility]) Hours: 24/7 Phone:  [phone] Fax: [phone]  [facility]): (offers outpatient therapy and intensive outpatient substance abuse therapy).  [address] [place],  [address] Phone: [phone]  [facility]: Offers FREE recovery skills classes, support groups, 1:1 Peer Support, and Compeer Classes. [address], [place], [place] [address] Phone: [phone] (Call to complete intake).   [facility] [place], [place] [address] Phone: [phone] ext [phone] The [facility] provides food, shelter and other programs and services to the homeless men of [place] through our [employer] program.  By offering safe shelter, three meals a day, clean clothing, Biblical counseling, financial planning, vocational training, GED/education and employment assistance, we've helped mend the shattered lives of many homeless men since opening in 1974.  We have approximately 267 beds available, with a max of 312 beds including mats for emergency situations and currently house an average of 270 men a night.  Prospective Client Check-In Information Photo ID Required (State/ Out of State/ [facility]) - if photo ID is not available, clients are required to have a printout of a police/sheriff's criminal history report. Help out with chores around the [place]. No sex offender of any type (pending, charged, registered and/or any other sex related offenses) will be permitted to check in. Must be willing to abide by all rules, regulations, and policies established by the [employer]. The following will be provided - shelter, food, clothing, and biblical counseling. If you or someone you know is in need of assistance at our [facility] shelter in [place], [place], please call [phone] ext. [phone].  [facility]-will provide timely access to mental health services for children and adolescents (4-17) and adults  presenting in a mental health crisis. The program is designed for those who need urgent Behavioral Health or Substance Use treatment and are not experiencing a medical crisis that would typically require an emergency room visit.    [place], [place] [address] Phone: [phone] [email or web address]: Phone#: [phone]  The [facility] SA Intensive Outpatient Program (SAIOP) means structured individual and group addiction activities and services that are provided at an outpatient program designed to assist adult and adolescent consumers to begin recovery and learn skills for recovery maintenance. The [place] program is offered at least 3 hours a day, 3 days a week.SAIOP services shall include a structured program consisting of, but not limited to, the following services: Individual counseling and support; Group counseling and support; Family counseling, training or support; Biochemical assays to identify recent drug use (e.g., urine drug screens); Strategies for relapse prevention to include community and social support systems in treatment; Life skills; Crisis contingency planning; Disease Management; and Treatment support activities that have been adapted or specifically designed for persons with physical disabilities, or persons with co-occurring disorders of mental illness and substance abuse/dependence or mental retardation/developmental disability and substance abuse/dependence. Phone: [phone]  Address:   The [facility] will also offer the following outpatient services: (Monday through Friday 8am-5pm)   Partial Hospitalization Program (PHP) Substance Abuse Intensive Outpatient Program (SA-IOP) Group Therapy Medication Management Peer Living Room We also provide (24/7):  Assessments: Our mental health clinician and providers will conduct a focused mental health evaluation, assessing for immediate  safety concerns and further mental health needs. Referral: Our team will provide resources and help connect to community based mental health treatment, when indicated, including psychotherapy, psychiatry, and other specialized behavioral health or substance use disorder services (for those not already in treatment). Transitional Care: Our team providers in person bridging and/or telephonic follow-up during the patient's transition to outpatient services.  The [facility] 24-Hour Call Center: [phone] Behavioral Health Crisis Line: [phone]

## 2021-04-01 NOTE — ED Notes (Signed)
No sxs of distress - will continue to monitor for safety

## 2021-04-04 ENCOUNTER — Emergency Department (HOSPITAL_COMMUNITY)
Admission: EM | Admit: 2021-04-04 | Discharge: 2021-04-05 | Disposition: A | Discharge status: Home / Self Care | Payer: Medicaid Other | Attending: Emergency Medicine | Admitting: Emergency Medicine

## 2021-04-04 ENCOUNTER — Other Ambulatory Visit: Payer: Self-pay

## 2021-04-04 ENCOUNTER — Encounter (HOSPITAL_COMMUNITY): Payer: Self-pay

## 2021-04-04 ENCOUNTER — Telehealth (HOSPITAL_COMMUNITY): Payer: Self-pay | Admitting: Internal Medicine

## 2021-04-04 DIAGNOSIS — Z20822 Contact with and (suspected) exposure to covid-19: Secondary | ICD-10-CM | POA: Insufficient documentation

## 2021-04-04 DIAGNOSIS — Z87891 Personal history of nicotine dependence: Secondary | ICD-10-CM | POA: Insufficient documentation

## 2021-04-04 DIAGNOSIS — Y900 Blood alcohol level of less than 20 mg/100 ml: Secondary | ICD-10-CM | POA: Insufficient documentation

## 2021-04-04 DIAGNOSIS — E871 Hypo-osmolality and hyponatremia: Secondary | ICD-10-CM | POA: Diagnosis not present

## 2021-04-04 DIAGNOSIS — I10 Essential (primary) hypertension: Secondary | ICD-10-CM | POA: Diagnosis not present

## 2021-04-04 DIAGNOSIS — F329 Major depressive disorder, single episode, unspecified: Secondary | ICD-10-CM | POA: Diagnosis not present

## 2021-04-04 DIAGNOSIS — F1020 Alcohol dependence, uncomplicated: Secondary | ICD-10-CM | POA: Insufficient documentation

## 2021-04-04 DIAGNOSIS — Z79899 Other long term (current) drug therapy: Secondary | ICD-10-CM | POA: Diagnosis not present

## 2021-04-04 DIAGNOSIS — R45851 Suicidal ideations: Secondary | ICD-10-CM | POA: Diagnosis not present

## 2021-04-04 DIAGNOSIS — E876 Hypokalemia: Secondary | ICD-10-CM | POA: Diagnosis not present

## 2021-04-04 LAB — CBC WITH DIFFERENTIAL/PLATELET
Abs Immature Granulocytes: 0.03 10*3/uL (ref 0.00–0.07)
Basophils Absolute: 0.1 10*3/uL (ref 0.0–0.1)
Basophils Relative: 1 %
Eosinophils Absolute: 0 10*3/uL (ref 0.0–0.5)
Eosinophils Relative: 0 %
HCT: 44.2 % (ref 39.0–52.0)
Hemoglobin: 14.8 g/dL (ref 13.0–17.0)
Immature Granulocytes: 0 %
Lymphocytes Relative: 20 %
Lymphs Abs: 1.9 10*3/uL (ref 0.7–4.0)
MCH: 31.6 pg (ref 26.0–34.0)
MCHC: 33.5 g/dL (ref 30.0–36.0)
MCV: 94.4 fL (ref 80.0–100.0)
Monocytes Absolute: 0.7 10*3/uL (ref 0.1–1.0)
Monocytes Relative: 7 %
Neutro Abs: 6.9 10*3/uL (ref 1.7–7.7)
Neutrophils Relative %: 72 %
Platelets: 349 10*3/uL (ref 150–400)
RBC: 4.68 MIL/uL (ref 4.22–5.81)
RDW: 12.2 % (ref 11.5–15.5)
WBC: 9.7 10*3/uL (ref 4.0–10.5)
nRBC: 0 % (ref 0.0–0.2)

## 2021-04-04 LAB — COMPREHENSIVE METABOLIC PANEL
ALT: 14 U/L (ref 0–44)
AST: 24 U/L (ref 15–41)
Albumin: 4.6 g/dL (ref 3.5–5.0)
Alkaline Phosphatase: 78 U/L (ref 38–126)
Anion gap: 10 (ref 5–15)
BUN: <5 mg/dL — ABNORMAL LOW (ref 8–23)
CO2: 25 mmol/L (ref 22–32)
Calcium: 9.1 mg/dL (ref 8.9–10.3)
Chloride: 95 mmol/L — ABNORMAL LOW (ref 98–111)
Creatinine, Ser: 0.75 mg/dL (ref 0.61–1.24)
GFR, Estimated: >60 mL/min (ref >60)
Glucose, Bld: 123 mg/dL — ABNORMAL HIGH (ref 70–99)
Potassium: 3 mmol/L — ABNORMAL LOW (ref 3.5–5.1)
Sodium: 130 mmol/L — ABNORMAL LOW (ref 135–145)
Total Bilirubin: 0.7 mg/dL (ref 0.3–1.2)
Total Protein: 7.6 g/dL (ref 6.5–8.1)

## 2021-04-04 LAB — RESP PANEL BY RT-PCR (FLU A&B, COVID) ARPGX2
Influenza A by PCR: NEGATIVE
Influenza B by PCR: NEGATIVE
SARS Coronavirus 2 by RT PCR: NEGATIVE

## 2021-04-04 LAB — SALICYLATE LEVEL: Salicylate Lvl: <7 mg/dL — ABNORMAL LOW (ref 7.0–30.0)

## 2021-04-04 LAB — RAPID URINE DRUG SCREEN, HOSP PERFORMED
Amphetamines: NOT DETECTED
Barbiturates: NOT DETECTED
Benzodiazepines: POSITIVE — AB
Cocaine: NOT DETECTED
Opiates: NOT DETECTED
Tetrahydrocannabinol: NOT DETECTED

## 2021-04-04 LAB — ACETAMINOPHEN LEVEL: Acetaminophen (Tylenol), Serum: <10 ug/mL — ABNORMAL LOW (ref 10–30)

## 2021-04-04 LAB — TSH: TSH: 0.951 u[IU]/mL (ref 0.350–4.500)

## 2021-04-04 LAB — ETHANOL: Alcohol, Ethyl (B): <10 mg/dL (ref <10)

## 2021-04-04 LAB — LIPASE, BLOOD: Lipase: 28 U/L (ref 11–51)

## 2021-04-04 MED ORDER — LORAZEPAM 1 MG PO TABS: 1.0000 mg | ORAL_TABLET | Freq: Once | ORAL | Status: DC

## 2021-04-04 MED ORDER — LISINOPRIL 10 MG PO TABS
10.0000 mg | ORAL_TABLET | Freq: Every day | ORAL | Status: DC
Start: 2021-04-04 — End: 2021-04-05
  Administered 2021-04-04 – 2021-04-05 (×2): 10 mg via ORAL
  Filled 2021-04-04 (×2): qty 1

## 2021-04-04 MED ORDER — ONDANSETRON 4 MG PO TBDP
4.0000 mg | ORAL_TABLET | ORAL | Status: DC | PRN
  Administered 2021-04-04: 4 mg via ORAL
  Filled 2021-04-04: qty 1

## 2021-04-04 MED ORDER — ACETAMINOPHEN 325 MG PO TABS
650.0000 mg | ORAL_TABLET | ORAL | Status: DC | PRN
  Administered 2021-04-04 – 2021-04-05 (×2): 650 mg via ORAL
  Filled 2021-04-04 (×2): qty 2

## 2021-04-04 MED ORDER — POTASSIUM CHLORIDE CRYS ER 20 MEQ PO TBCR
40.0000 meq | EXTENDED_RELEASE_TABLET | Freq: Once | ORAL | Status: AC
  Administered 2021-04-05: 40 meq via ORAL
  Filled 2021-04-04: qty 2

## 2021-04-04 MED ORDER — ACETAMINOPHEN 325 MG PO TABS
650.0000 mg | ORAL_TABLET | Freq: Once | ORAL | Status: AC
  Administered 2021-04-04: 650 mg via ORAL
  Filled 2021-04-04: qty 2

## 2021-04-04 MED ORDER — PANTOPRAZOLE SODIUM 40 MG PO TBEC
40.0000 mg | DELAYED_RELEASE_TABLET | Freq: Every day | ORAL | Status: DC
  Administered 2021-04-04 – 2021-04-05 (×2): 40 mg via ORAL
  Filled 2021-04-04 (×2): qty 1

## 2021-04-04 MED ORDER — POTASSIUM CHLORIDE CRYS ER 20 MEQ PO TBCR
40.0000 meq | EXTENDED_RELEASE_TABLET | Freq: Once | ORAL | Status: AC
  Administered 2021-04-04: 40 meq via ORAL
  Filled 2021-04-04: qty 2

## 2021-04-04 MED ORDER — DULOXETINE HCL 30 MG PO CPEP
60.0000 mg | ORAL_CAPSULE | Freq: Every day | ORAL | Status: DC
  Administered 2021-04-05: 60 mg via ORAL
  Filled 2021-04-04: qty 2

## 2021-04-04 MED ORDER — QUETIAPINE FUMARATE 300 MG PO TABS
300.0000 mg | ORAL_TABLET | Freq: Every day | ORAL | Status: DC
  Administered 2021-04-04: 300 mg via ORAL
  Filled 2021-04-04: qty 1

## 2021-04-04 MED ORDER — CLONAZEPAM 0.5 MG PO TABS
1.0000 mg | ORAL_TABLET | Freq: Three times a day (TID) | ORAL | Status: DC | PRN
  Administered 2021-04-04 – 2021-04-05 (×3): 1 mg via ORAL
  Filled 2021-04-04 (×3): qty 2

## 2021-04-04 MED ORDER — TIZANIDINE HCL 4 MG PO TABS
2.0000 mg | ORAL_TABLET | Freq: Three times a day (TID) | ORAL | Status: DC | PRN
  Administered 2021-04-04 – 2021-04-05 (×2): 2 mg via ORAL
  Filled 2021-04-04 (×2): qty 1

## 2021-04-04 MED ORDER — THIAMINE HCL 100 MG PO TABS
100.0000 mg | ORAL_TABLET | Freq: Every day | ORAL | Status: DC
  Administered 2021-04-04 – 2021-04-05 (×2): 100 mg via ORAL
  Filled 2021-04-04 (×2): qty 1

## 2021-04-04 MED ORDER — LORAZEPAM 1 MG PO TABS: 2.0000 mg | ORAL_TABLET | Freq: Once | ORAL | Status: DC

## 2021-04-04 MED ORDER — VERAPAMIL HCL ER 240 MG PO TBCR
240.0000 mg | EXTENDED_RELEASE_TABLET | Freq: Every day | ORAL | Status: DC
  Administered 2021-04-05: 240 mg via ORAL
  Filled 2021-04-04 (×2): qty 1

## 2021-04-04 MED ORDER — LORAZEPAM 1 MG PO TABS: 0.0000 mg | ORAL_TABLET | Freq: Two times a day (BID) | ORAL | Status: DC

## 2021-04-04 MED ORDER — THIAMINE HCL 100 MG/ML IJ SOLN: 100.0000 mg | Freq: Every day | INTRAMUSCULAR | Status: DC

## 2021-04-04 MED ORDER — SUCRALFATE 1 GM/10ML PO SUSP
1.0000 g | Freq: Three times a day (TID) | ORAL | Status: DC
  Administered 2021-04-04 – 2021-04-05 (×5): 1 g via ORAL
  Filled 2021-04-04 (×5): qty 10

## 2021-04-04 MED ORDER — LORAZEPAM 1 MG PO TABS
0.0000 mg | ORAL_TABLET | Freq: Four times a day (QID) | ORAL | Status: DC
  Administered 2021-04-04 – 2021-04-05 (×4): 2 mg via ORAL
  Filled 2021-04-04 (×4): qty 2

## 2021-04-04 MED ORDER — ZOLPIDEM TARTRATE 5 MG PO TABS
5.0000 mg | ORAL_TABLET | Freq: Every day | ORAL | Status: DC
  Administered 2021-04-04: 5 mg via ORAL
  Filled 2021-04-04: qty 1

## 2021-04-04 NOTE — BH Assessment (Signed)
Clinician requested patient's nurse Caryl Comes, RN), to set up the tele assessment machine, to complete patient's initial TTS assessment.

## 2021-04-04 NOTE — ED Notes (Signed)
Pt belongings locked in cabinet.

## 2021-04-04 NOTE — ED Provider Notes (Addendum)
[facility] DEPT Provider Note   CSN: [id number] Arrival date & time: 04/04/21  1016     History Chief Complaint  Patient presents with   Suicidal    VALE MOUSSEAU is a 65 y.o. male.  HPI Patient is a 65 year old male with past medical history significant for alcohol abuse, cane use, chronic diarrhea, chronic headaches, chronic pain, reflux, gastritis, depression, anxiety, ankylosing spondylitis  Patient is presented to the ER today with complaints of suicidal thoughts he states he wants to cut open his wrist because he feels hopeless.  States that he is depressed.  Says he was recently diagnosed with throat cancer. States it hurts to swallow but has not had to spit out saliva.   Also states that he has pain all over.  Denies any focal areas of pain and states that he hurts from his head to his toe.  Is not anymore forthcoming about his symptoms thinness.  Level 5 caveat due to psychosis/behavioral health issues     Past Medical History:  Diagnosis Date   Alcohol abuse, in remission    Anal fissure    Anemia    Ankylosing spondylitis (HCC)    Anxiety    Arthritis    Chronic diarrhea    Chronic headaches    Chronic pain    Colitis    Colon polyps    Depression    Esophagitis    Gastric polyps    hyperplastic and fundic gland   Gastritis    GERD (gastroesophageal reflux disease)    Hypertension    IBS (irritable bowel syndrome)    Poor dentition    SIADH (syndrome of inappropriate ADH production) ([place])     Patient Active Problem List   Diagnosis Date Noted   MDD (major depressive disorder), recurrent episode, severe ([place]) 03/31/2021   Substance induced mood disorder ([place]) 03/31/2021   Family history of colon cancer 12/02/2018   Gastric polyps    Schatzki's ring    Left sided ulcerative colitis without complication ([place])    Odynophagia 11/25/2018   Dysphagia 11/25/2018   Major depressive disorder, recurrent  severe without psychotic features ([place]) 06/24/2018   IBS (irritable bowel syndrome)    Hypertension    Colitis    Adjustment disorder with mixed anxiety and depressed mood 05/11/2017   Alcohol abuse    Anxiety    Gastroesophageal reflux disease    Osteopenia determined by x-ray 08/06/2014   Stress fracture of calcaneus 08/06/2014   Vitamin D deficiency 12/18/2013   Dementia ([place]) 12/18/2013   Benign microscopic hematuria 07/06/2013   SIADH (syndrome of inappropriate ADH production) ([place]) 06/22/2013   Ankylosing spondylitis ([place]) 06/20/2013   Malnutrition of moderate degree ([place]) 06/20/2013   BPH (benign prostatic hyperplasia) 08/22/2010   History of colonic polyps 08/13/2008   Essential hypertension 07/03/2007   PUD (peptic ulcer disease) 07/03/2007    Past Surgical History:  Procedure Laterality Date   BIOPSY  11/26/2018   Procedure: BIOPSY;  Surgeon: Gatha Mayer, MD;  Location: [facility];  Service: Endoscopy;;   COLONOSCOPY W/ BIOPSIES     COLONOSCOPY WITH PROPOFOL N/A 11/26/2018   Procedure: COLONOSCOPY WITH PROPOFOL;  Surgeon: Gatha Mayer, MD;  Location: [facility];  Service: Endoscopy;  Laterality: N/A;   ESOPHAGOGASTRODUODENOSCOPY     ESOPHAGOGASTRODUODENOSCOPY (EGD) WITH PROPOFOL N/A 11/26/2018   Procedure: ESOPHAGOGASTRODUODENOSCOPY (EGD) WITH PROPOFOL;  Surgeon: Gatha Mayer, MD;  Location: [facility];  Service: Endoscopy;  Laterality: N/A;  HEMOSTASIS CLIP PLACEMENT  11/26/2018   Procedure: HEMOSTASIS CLIP PLACEMENT;  Surgeon: Gatha Mayer, MD;  Location: [facility];  Service: Endoscopy;;   HERNIA REPAIR Bilateral    HOT HEMOSTASIS N/A 11/26/2018   Procedure: HOT HEMOSTASIS (ARGON PLASMA COAGULATION/BICAP);  Surgeon: Gatha Mayer, MD;  Location: [facility];  Service: Endoscopy;  Laterality: N/A;   OPEN REDUCTION INTERNAL FIXATION (ORIF) DISTAL RADIAL FRACTURE Right 02/11/2018   Procedure: OPEN REDUCTION INTERNAL FIXATION (ORIF) DISTAL RADIAL  FRACTURE;  Surgeon: Milly Jakob, MD;  Location: [facility];  Service: Orthopedics;  Laterality: Right;   POLYPECTOMY  11/26/2018   Procedure: POLYPECTOMY;  Surgeon: Gatha Mayer, MD;  Location: [facility];  Service: Endoscopy;;   TONSILLECTOMY         Family History  Problem Relation Age of Onset   Anxiety disorder Mother    Congestive Heart Failure Mother    Crohn's disease Mother    Colon cancer Mother 79   Arthritis Father    High blood pressure Father    Crohn's disease Father     Social History   Tobacco Use   Smoking status: Former    Types: Cigarettes    Quit date: 2014    Years since quitting: 8.8   Smokeless tobacco: Never  Vaping Use   Vaping Use: Never used  Substance Use Topics   Alcohol use: Yes    Comment: 1 25 oz of beer a night   Drug use: Yes    Types: Marijuana    Comment: other night    Home Medications Prior to Admission medications   Medication Sig Start Date End Date Taking? Authorizing Provider  verapamil (CALAN-SR) 240 MG CR tablet Take 240 mg by mouth daily. 02/09/17  Yes [provider]  clonazePAM (KLONOPIN) 1 MG tablet Take 1 mg by mouth 3 (three) times daily as needed. Patient not taking: Reported on 04/04/2021 02/11/21   [provider]  DULoxetine (CYMBALTA) 30 MG capsule Take 60 mg by mouth daily. 03/06/21   [provider]  lisinopril (PRINIVIL,ZESTRIL) 10 MG tablet Take 1 tablet (10 mg total) by mouth daily. 08/21/17 03/30/21  Melanee Spry, MD  Multiple Vitamin (MULTIVITAMIN WITH MINERALS) TABS tablet Take 1 tablet by mouth daily. (May buy over the counter) Patient not taking: Reported on 04/04/2021 06/28/18   Connye Burkitt, NP  ondansetron (ZOFRAN ODT) 4 MG disintegrating tablet Take 1 tablet (4 mg total) by mouth every 4 (four) hours as needed for nausea or vomiting. 03/24/21   Charlesetta Shanks, MD  pantoprazole (PROTONIX) 40 MG tablet Take 1 tablet (40 mg total) by mouth daily. 04/24/14   Janith Lima, MD  sucralfate (CARAFATE) 1 GM/10ML suspension Take 10 mLs (1 g total) by mouth 4 (four) times daily -  with meals and at bedtime. 03/24/21   Charlesetta Shanks, MD  tiZANidine (ZANAFLEX) 4 MG tablet Take 2 mg by mouth 3 (three) times daily as needed for muscle spasms. 03/05/21   [provider]    Allergies    Diphenhydramine hcl, Flexeril [cyclobenzaprine], Hctz [hydrochlorothiazide], Nsaids, Rexulti [brexpiprazole], Seroquel [quetiapine], Gabapentin, and Hydroxyzine  Review of Systems   Review of Systems  Constitutional:  Negative for chills and fever.  HENT:  Negative for congestion.   Eyes:  Negative for pain.  Respiratory:  Negative for cough and shortness of breath.   Cardiovascular:  Negative for chest pain and leg swelling.  Gastrointestinal:  Negative for abdominal pain and vomiting.  Genitourinary:  Negative for dysuria.  Musculoskeletal:  Positive for myalgias.  Skin:  Negative for rash.  Neurological:  Negative for dizziness and headaches.   Physical Exam Updated Vital Signs BP 137/71   Pulse 69   Temp 98.7 F (37.1 C) (Oral)   Resp 18   Ht 5' 10"  (1.778 m)   Wt 73 kg   SpO2 98%   BMI 23.09 kg/m   Physical Exam Vitals and nursing note reviewed.  Constitutional:      General: He is not in acute distress.    Comments: Disheveled chronically ill-appearing 65 year old male in no acute distress.  HENT:     Head: Normocephalic and atraumatic.     Nose: Nose normal.  Eyes:     General: No scleral icterus. Cardiovascular:     Rate and Rhythm: Normal rate and regular rhythm.     Pulses: Normal pulses.     Heart sounds: Normal heart sounds.  Pulmonary:     Effort: Pulmonary effort is normal. No respiratory distress.     Breath sounds: No wheezing.  Abdominal:     Palpations: Abdomen is soft.     Tenderness: There is no abdominal tenderness.  Musculoskeletal:     Cervical back: Normal range of motion.     Right lower leg: No edema.     Left lower  leg: No edema.  Skin:    General: Skin is warm and dry.     Capillary Refill: Capillary refill takes less than 2 seconds.  Neurological:     Mental Status: He is alert. Mental status is at baseline.     Comments: AO x3 moves all 4 extremities  Psychiatric:     Comments: Seems anxious.  Somewhat irritated and irritable.    ED Results / Procedures / Treatments   Labs (all labs ordered are listed, but only abnormal results are displayed) Labs Reviewed  COMPREHENSIVE METABOLIC PANEL - Abnormal; Notable for the following components:      Result Value   Sodium 130 (*)    Potassium 3.0 (*)    Chloride 95 (*)    Glucose, Bld 123 (*)    BUN <5 (*)    All other components within normal limits  RAPID URINE DRUG SCREEN, HOSP PERFORMED - Abnormal; Notable for the following components:   Benzodiazepines POSITIVE (*)    All other components within normal limits  SALICYLATE LEVEL - Abnormal; Notable for the following components:   Salicylate Lvl <8.3 (*)    All other components within normal limits  ACETAMINOPHEN LEVEL - Abnormal; Notable for the following components:   Acetaminophen (Tylenol), Serum <10 (*)    All other components within normal limits  RESP PANEL BY RT-PCR (FLU A&B, COVID) ARPGX2  ETHANOL  CBC WITH DIFFERENTIAL/PLATELET  LIPASE, BLOOD  TSH    EKG None  Radiology No results found.  Procedures Procedures   Medications Ordered in ED Medications  LORazepam (ATIVAN) tablet 0-4 mg (2 mg Oral Given 04/04/21 1146)  LORazepam (ATIVAN) tablet 0-4 mg (has no administration in time range)  thiamine tablet 100 mg (100 mg Oral Given 04/04/21 1148)    Or  thiamine (B-1) injection 100 mg ( Intravenous See Alternative 04/04/21 1148)  acetaminophen (TYLENOL) tablet 650 mg (650 mg Oral Given 04/04/21 1311)  DULoxetine (CYMBALTA) DR capsule 60 mg (60 mg Oral Patient Refused/Not Given 04/04/21 1147)  lisinopril (ZESTRIL) tablet 10 mg (10 mg Oral Given 04/04/21 1148)   ondansetron (ZOFRAN-ODT) disintegrating tablet 4 mg (4 mg  Oral Given 04/04/21 1311)  pantoprazole (PROTONIX) EC tablet 40 mg (40 mg Oral Given 04/04/21 1148)  sucralfate (CARAFATE) 1 GM/10ML suspension 1 g (1 g Oral Given 04/04/21 1148)  tiZANidine (ZANAFLEX) tablet 2 mg (has no administration in time range)  verapamil (CALAN-SR) CR tablet 240 mg (240 mg Oral Patient Refused/Not Given 04/04/21 1311)  clonazePAM (KLONOPIN) tablet 1 mg (has no administration in time range)  potassium chloride SA (KLOR-CON) CR tablet 40 mEq (has no administration in time range)    ED Course  I have reviewed the triage vital signs and the nursing notes.  Pertinent labs & imaging results that were available during my care of the patient were reviewed by me and considered in my medical decision making (see chart for details).    MDM Rules/Calculators/A&P                          Patient is a 65 year old male with past medical history detailed in HPI  Here for suicidal thoughts and plan to cut his wrist open.  He states that he has attempted in the past. Seems to live alone and be at risk for suicidality.  Also seems to have some cognitive issues with dementia which may have been recently diagnosed.  Also seems to not be taking his medications as prescribed I think this is very likely given his cognitive issues.  Physical exam relatively reassuring he does not appear acutely ill or toxic appearing.  CMP notable for hypokalemia and hyponatremia.  Patient seems that he is not eating or drinking very well.  Will encourage p.o. food and fluid.  We will provide patient with Zofran as needed for any nausea.  CBC without leukocytosis or anemia.  Lipase within normal limits.  Ethanol undetectable salicylate and Tylenol undetectable.  Urine drug screen positive only for benzos which patient is prescribed TSH pending.  COVID influenza negative. EKG nonischemic.  Patient's home medications have been ordered, TTS  recommends straight overnight observation and reevaluation by psychiatry in the morning.  I have also also ordered p.o. potassium x1 tomorrow.  He was also given 1 dose of potassium today.  Magnesium added on.  Patient not under IVC at this time.  He is here voluntarily states that he wishes to be here  Pt medically cleared at this time. Hypokalemia addressed. Will need recheck in the future w pcp.  Final Clinical Impression(s) / ED Diagnoses Final diagnoses:  Suicidal ideation    Rx / DC Orders ED Discharge Orders     None        Tedd Sias, [place] 04/04/21 1452    Tedd Sias, [place] 04/04/21 1453    Blanchie Dessert, MD 04/04/21 1535

## 2021-04-04 NOTE — ED Notes (Signed)
Pt doing TTS consult at this time.

## 2021-04-04 NOTE — BH Assessment (Addendum)
Care Management - Follow Up [facility] Discharges   Writer attempted to make contact with patient today and was unsuccessful.  Writer left a HIPPA compliant voice message.    Per chart review, patient receives medications management from his primary care provider at [facility].  He sees outpatient counseling through [facility] however would prefer to seek counseling more frequently.  Therefore, patient was provided outpatient resources to [facility].

## 2021-04-04 NOTE — ED Notes (Signed)
Pt received lunch tray at this time.

## 2021-04-04 NOTE — ED Triage Notes (Signed)
Pt BIB EMS. Pt coming from home c/o SI for about a week. Plan to cut his wrist. Pt states he has throat CA but has never been diagnosed. Pt states he cant swallow.
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
## 2021-04-04 NOTE — Progress Notes (Signed)
CSW was consulted due to issues with living situation. Pt is currently being observed by Psych. CSW continue to follow pt disposition throughout hospitalization.    Arlie Solomons.Tanazia Achee, MSW, [place]  Transitions of Care Clinical Social Worker I Direct Dial: [phone]  Fax: [phone] [email or web address]

## 2021-04-04 NOTE — BH Assessment (Signed)
[facility] Assessment Progress Note   Per Pecolia Ades, NP pt is to remain at [facility] overnight for further observation and stabilization.  She also recommends that pt be referred for ACT Team services while here.  At 15:46 I called [facility].  They report that their admissions person, Ezzard Flax, is currently not in the office, but they agreed to have her call me back at [phone].  Return call is pending as of this writing.  Reviewing pt's record I found that he currently receives outpatient psychiatry through [facility], and [employer] through [facility].  At 15:52 I called [facility], informing them that we are recommending pt for ACT Team.  They agree to contact area ACT Team providers to pursue referral.  Santiago Glad has been notified.  Jalene Mullet, [place] Coordinator [phone]

## 2021-04-05 ENCOUNTER — Encounter (HOSPITAL_COMMUNITY): Payer: Self-pay | Admitting: Emergency Medicine

## 2021-04-05 DIAGNOSIS — R45851 Suicidal ideations: Secondary | ICD-10-CM

## 2021-04-05 LAB — MAGNESIUM: Magnesium: 2 mg/dL (ref 1.7–2.4)

## 2021-04-05 MED ORDER — BISACODYL 5 MG PO TBEC
5.0000 mg | DELAYED_RELEASE_TABLET | Freq: Once | ORAL | Status: AC
  Administered 2021-04-05: 5 mg via ORAL
  Filled 2021-04-05: qty 1

## 2021-04-05 MED ORDER — LORAZEPAM 1 MG PO TABS: 1.0000 mg | ORAL_TABLET | Freq: Three times a day (TID) | ORAL | 0 refills | Status: AC | PRN

## 2021-04-05 NOTE — Progress Notes (Signed)
CSW spoke with pt, he denies needing any assistance from [place], he stated "I just need a ride home". CSW will contact [facility] [employer] to set up ride upon d/c.  Arlie Solomons.Delfina Schreurs, MSW, [place]  Transitions of Care Clinical Social Worker I Direct Dial: [phone]  Fax: [phone] [email or web address]

## 2021-04-05 NOTE — Discharge Instructions (Signed)
For your behavioral health needs you are advised to continue treatment with your current outpatient providers:       [facility].      [place], [place] [address]      [phone]       [facility]      [address] [place]      [place], [place] [address]      [phone]

## 2021-04-05 NOTE — ED Notes (Signed)
Pt making phone call. Pt states he is no longer SI and needs to leave to pay bills.

## 2021-04-05 NOTE — ED Provider Notes (Addendum)
Emergency Medicine Observation Re-evaluation Note  Anthony Lamb is a 65 y.o. male, seen on rounds today.  Pt initially presented to the ED for complaints of Suicidal Currently, the patient is resting.  Physical Exam  BP 125/67   Pulse 61   Temp 97.8 F (36.6 C) (Oral)   Resp 12   Ht 5' 10"  (1.778 m)   Wt 73 kg   SpO2 97%   BMI 23.09 kg/m  Physical Exam General: resting comfortably, NAD Lungs: normal WOB Psych: currently calm and resting  ED Course / MDM  EKG:   I have reviewed the labs performed to date as well as medications administered while in observation.  Recent changes in the last 24 hours include none.  Plan  Current plan is for psych reassesment.  Patient has been psych cleared.  Patient states that he cannot refill his Ativan prescription for the next week.  Confirmed with pharmacy that the patient's prescription for Ativan would have ran out today, there is been a lapse in his refill.  Patient has been sent 1 week of Ativan.   Anthony Lamb is not under involuntary commitment.     Anthony Gibbs, DO 04/05/21 2423    Anthony Gibbs, DO 04/05/21 1416

## 2021-04-05 NOTE — Consult Note (Signed)
[facility] Psych ED Discharge  04/05/2021 1:45 PM Anthony Lamb  MRN:  [id number]  Method of visit?: Virtual (Location of provider: [facility] Location of patient: [place] and roles of anyone participating in the consult/assessment: Amedeo Plenty, patient, Pecolia Ades, NP, Dr. Serafina Mitchell, psychiatrist.  This service was provided via telemedicine using a 2-way, interactive audio, and video technology. )  Principal Problem: Suicidal ideation Discharge Diagnoses: Principal Problem:   Suicidal ideation   Subjective:  per admit note:  Patient is a 65 year old male with past medical history significant for alcohol abuse, cane use, chronic diarrhea, chronic headaches, chronic pain, reflux, gastritis, depression, anxiety, ankylosing spondylitis   Patient is presented to the ER today with complaints of suicidal thoughts he states he wants to cut open his wrist because he feels hopeless.  States that he is depressed.   Says he was recently diagnosed with throat cancer. States it hurts to swallow but has not had to spit out saliva.    Also states that he has pain all over.  Denies any focal areas of pain and states that he hurts from his head to his toe.   Is not anymore forthcoming about his symptoms thinness.   Today, patient is seen by this provider via tele psych while at [facility]. Patient immediately told this provider, "I was suicidal, but I am not anymore". "I have business to take care of, my money came in and I need to pay bills".   Denies suicidal ideation, no intent, no plan, "I don't want to go to hell". Denies homicidal ideation, denies auditory and visual hallucinations. Does not appear to be responding to internal or external stimuli. Able to contract for safety.  Reports he has many health issues which has caused him to lose weight due to low appetite. His medical providers are with [facility] and he gets services with [facility], which have agreed to pursue an ACT Team for this  patient to ensure he is better served in the community per the behavioral health specialist request. This patient would benefit from more intensive outpatient services as he has frequent visits to the emergency department.   Total Time spent with patient: 30 minutes  Past Psychiatric History:   Past Medical History:  Past Medical History:  Diagnosis Date   Alcohol abuse, in remission    Anal fissure    Anemia    Ankylosing spondylitis (HCC)    Anxiety    Arthritis    Chronic diarrhea    Chronic headaches    Chronic pain    Colitis    Colon polyps    Depression    Esophagitis    Gastric polyps    hyperplastic and fundic gland   Gastritis    GERD (gastroesophageal reflux disease)    Hypertension    IBS (irritable bowel syndrome)    Poor dentition    SIADH (syndrome of inappropriate ADH production) ([place])     Past Surgical History:  Procedure Laterality Date   BIOPSY  11/26/2018   Procedure: BIOPSY;  Surgeon: Gatha Mayer, MD;  Location: [facility];  Service: Endoscopy;;   COLONOSCOPY W/ BIOPSIES     COLONOSCOPY WITH PROPOFOL N/A 11/26/2018   Procedure: COLONOSCOPY WITH PROPOFOL;  Surgeon: Gatha Mayer, MD;  Location: [facility];  Service: Endoscopy;  Laterality: N/A;   ESOPHAGOGASTRODUODENOSCOPY     ESOPHAGOGASTRODUODENOSCOPY (EGD) WITH PROPOFOL N/A 11/26/2018   Procedure: ESOPHAGOGASTRODUODENOSCOPY (EGD) WITH PROPOFOL;  Surgeon: Gatha Mayer, MD;  Location: [facility];  Service: Endoscopy;  Laterality: N/A;   HEMOSTASIS CLIP PLACEMENT  11/26/2018   Procedure: HEMOSTASIS CLIP PLACEMENT;  Surgeon: Gatha Mayer, MD;  Location: [facility];  Service: Endoscopy;;   HERNIA REPAIR Bilateral    HOT HEMOSTASIS N/A 11/26/2018   Procedure: HOT HEMOSTASIS (ARGON PLASMA COAGULATION/BICAP);  Surgeon: Gatha Mayer, MD;  Location: [facility];  Service: Endoscopy;  Laterality: N/A;   OPEN REDUCTION INTERNAL FIXATION (ORIF) DISTAL RADIAL FRACTURE Right 02/11/2018    Procedure: OPEN REDUCTION INTERNAL FIXATION (ORIF) DISTAL RADIAL FRACTURE;  Surgeon: Milly Jakob, MD;  Location: [place];  Service: Orthopedics;  Laterality: Right;   POLYPECTOMY  11/26/2018   Procedure: POLYPECTOMY;  Surgeon: Gatha Mayer, MD;  Location: [facility];  Service: Endoscopy;;   TONSILLECTOMY     Family History:  Family History  Problem Relation Age of Onset   Anxiety disorder Mother    Congestive Heart Failure Mother    Crohn's disease Mother    Colon cancer Mother 47   Arthritis Father    High blood pressure Father    Crohn's disease Father    Family Psychiatric  History:  Social History:  Social History   Substance and Sexual Activity  Alcohol Use Yes   Comment: 1 25 oz of beer a night     Social History   Substance and Sexual Activity  Drug Use Yes   Types: Marijuana   Comment: other night    Social History   Socioeconomic History   Marital status: Single    Spouse name: Not on file   Number of children: 0   Years of education: Not on file   Highest education level: Not on file  Occupational History   Not on file  Tobacco Use   Smoking status: Former    Types: Cigarettes    Quit date: 2014    Years since quitting: 8.8   Smokeless tobacco: Never  Vaping Use   Vaping Use: Never used  Substance and Sexual Activity   Alcohol use: Yes    Comment: 1 25 oz of beer a night   Drug use: Yes    Types: Marijuana    Comment: other night   Sexual activity: Not Currently  Other Topics Concern   Not on file  Social History Narrative   HSG. Long - term disability - unable to work. Lived with his mother in her house - she died 05/04/2023 -    Lives in apartment   Has case worker   Social Determinants of Radio broadcast assistant Strain: Not on file  Food Insecurity: Not on file  Transportation Needs: Not on file  Physical Activity: Not on file  Stress: Not on file  Social Connections: Not on file    Tobacco Cessation:  N/A, patient does not  currently use tobacco products  Current Medications: Current Facility-Administered Medications  Medication Dose Route Frequency Provider Last Rate Last Admin   acetaminophen (TYLENOL) tablet 650 mg  650 mg Oral Q4H PRN Tedd Sias, PA   650 mg at 04/05/21 1035   clonazePAM (KLONOPIN) tablet 1 mg  1 mg Oral TID PRN Pati Gallo S, PA   1 mg at 04/05/21 0756   DULoxetine (CYMBALTA) DR capsule 60 mg  60 mg Oral Daily Pati Gallo S, PA   60 mg at 04/05/21 0759   lisinopril (ZESTRIL) tablet 10 mg  10 mg Oral Daily Pati Gallo S, PA   10 mg at 04/05/21 [phone]  LORazepam (ATIVAN) tablet 0-4 mg  0-4 mg Oral Q6H F[place], Wylder S, PA   2 mg at 04/05/21 1036   [START ON 04/06/2021] LORazepam (ATIVAN) tablet 0-4 mg  0-4 mg Oral Q12H Fondaw, Wylder S, PA       ondansetron (ZOFRAN-ODT) disintegrating tablet 4 mg  4 mg Oral Q4H PRN Pati Gallo S, PA   4 mg at 04/04/21 1311   pantoprazole (PROTONIX) EC tablet 40 mg  40 mg Oral Daily Pati Gallo S, PA   40 mg at 04/05/21 0758   QUEtiapine (SEROQUEL) tablet 300 mg  300 mg Oral QHS Wynona Dove A, DO   300 mg at 04/04/21 2208   sucralfate (CARAFATE) 1 GM/10ML suspension 1 g  1 g Oral TID WC & HS Fondaw, Wylder S, PA   1 g at 04/05/21 1230   thiamine tablet 100 mg  100 mg Oral Daily Pati Gallo S, PA   100 mg at 04/05/21 0102   Or   thiamine (B-1) injection 100 mg  100 mg Intravenous Daily Fondaw, Wylder S, PA       tiZANidine (ZANAFLEX) tablet 2 mg  2 mg Oral TID PRN Pati Gallo S, PA   2 mg at 04/05/21 0755   verapamil (CALAN-SR) CR tablet 240 mg  240 mg Oral Daily Tedd Sias, [place]   240 mg at 04/05/21 0849   zolpidem (AMBIEN) tablet 5 mg  5 mg Oral QHS Wynona Dove A, DO   5 mg at 04/04/21 2208   Current Outpatient Medications  Medication Sig Dispense Refill   acetaminophen (TYLENOL) 500 MG tablet Take 500-1,000 mg by mouth every 6 (six) hours as needed (for pain).     clonazePAM (KLONOPIN) 0.5 MG tablet Take 0.5 mg by mouth 3  (three) times daily.     diclofenac Sodium (VOLTAREN) 1 % GEL Apply 2 g topically 4 (four) times daily as needed (to painful sites).     lisinopril (PRINIVIL,ZESTRIL) 10 MG tablet Take 1 tablet (10 mg total) by mouth daily. 30 tablet 0   LORazepam (ATIVAN) 1 MG tablet Take 1 mg by mouth every 8 (eight) hours as needed for anxiety.     meloxicam (MOBIC) 15 MG tablet Take 15 mg by mouth daily.     methocarbamol (ROBAXIN) 500 MG tablet Take 500 mg by mouth every 8 (eight) hours as needed for muscle spasms.     ondansetron (ZOFRAN ODT) 4 MG disintegrating tablet Take 1 tablet (4 mg total) by mouth every 4 (four) hours as needed for nausea or vomiting. (Patient taking differently: Take 4 mg by mouth every 4 (four) hours as needed for nausea or vomiting (DISSOLVE ORALLY).) 20 tablet 0   pantoprazole (PROTONIX) 40 MG tablet Take 1 tablet (40 mg total) by mouth daily. (Patient taking differently: Take 40 mg by mouth in the morning.) 30 tablet 11   QUEtiapine (SEROQUEL) 300 MG tablet Take 300 mg by mouth at bedtime.     sucralfate (CARAFATE) 1 GM/10ML suspension Take 10 mLs (1 g total) by mouth 4 (four) times daily -  with meals and at bedtime. 420 mL 0   tiZANidine (ZANAFLEX) 4 MG tablet Take 4 mg by mouth 3 (three) times daily as needed for muscle spasms.     verapamil (CALAN-SR) 240 MG CR tablet Take 240 mg by mouth daily.  2   zolpidem (AMBIEN) 5 MG tablet Take 5 mg by mouth at bedtime.     clonazePAM (KLONOPIN) 1 MG tablet  Take 1 mg by mouth 3 (three) times daily as needed. (Patient not taking: No sig reported)     DULoxetine (CYMBALTA) 30 MG capsule Take 60 mg by mouth daily. (Patient not taking: Reported on 04/04/2021)     Multiple Vitamin (MULTIVITAMIN WITH MINERALS) TABS tablet Take 1 tablet by mouth daily. (May buy over the counter) (Patient not taking: Reported on 04/04/2021) 1 tablet 0   PTA Medications: (Not in a hospital admission)   Musculoskeletal: Strength & Muscle Tone: within normal  limits Gait & Station: normal Patient leans: N/A  Psychiatric Specialty Exam:  Presentation  General Appearance: Casual  Eye Contact:Good  Speech:Clear and Coherent  Speech Volume:Normal  Handedness:Right   Mood and Affect  Mood:Euthymic  Affect:Congruent; Appropriate   Thought Process  Thought Processes:Coherent; Goal Directed  Descriptions of Associations:Intact  Orientation:Full (Time, Place and Person)  Thought Content:Logical  History of Schizophrenia/Schizoaffective disorder:No  Duration of Psychotic Symptoms:No data recorded Hallucinations:Hallucinations: None Ideas of Reference:None  Suicidal Thoughts:Suicidal Thoughts: No Homicidal Thoughts:Homicidal Thoughts: No  Sensorium  Memory:Immediate Good; Recent Good; Remote Good  Judgment:Good  Insight:Good   Executive Functions  Concentration:Good  Attention Span:Good  [place] of Knowledge:Good  Language:Good   Psychomotor Activity  Psychomotor Activity:Psychomotor Activity: Normal  Assets  Assets:Communication Skills; Desire for Improvement; Financial Resources/Insurance; Housing; Leisure Time; Resilience   Sleep  Sleep:Sleep: Good   Physical Exam: Physical Exam Vitals reviewed.  Cardiovascular:     Rate and Rhythm: Normal rate.  Pulmonary:     Effort: Pulmonary effort is normal.  Neurological:     Mental Status: He is alert and oriented to person, place, and time.  Psychiatric:        Attention and Perception: Attention normal. He does not perceive auditory or visual hallucinations.        Mood and Affect: Mood normal.        Speech: Speech normal.        Behavior: Behavior is cooperative.        Thought Content: Thought content is not paranoid or delusional. Thought content does not include homicidal or suicidal ideation. Thought content does not include homicidal or suicidal plan.   Review of Systems  Constitutional:  Negative for chills and fever.   Cardiovascular:  Negative for chest pain.  Gastrointestinal:  Negative for abdominal pain.  Neurological:  Negative for weakness and headaches.  Psychiatric/Behavioral:  Negative for depression, hallucinations, substance abuse and suicidal ideas. The patient is not nervous/anxious and does not have insomnia.   Blood pressure 119/69, pulse 66, temperature 97.8 F (36.6 C), temperature source Oral, resp. rate 16, height 5' 10"  (1.778 m), weight 73 kg, SpO2 99 %. Body mass index is 23.09 kg/m.   Demographic Factors:  Male, Age 62 or older, and Low socioeconomic status  Loss Factors: Decline in physical health  Historical Factors: NA  Risk Reduction Factors:   Religious beliefs about death  Continued Clinical Symptoms:  Chronic Pain  Cognitive Features That Contribute To Risk:  None    Suicide Risk:  Mild:  Suicidal ideation of limited frequency, intensity, duration, and specificity.  There are no identifiable plans, no associated intent, mild dysphoria and related symptoms, good self-control (both objective and subjective assessment), few other risk factors, and identifiable protective factors, including available and accessible social support.    Plan Of Care/Follow-up recommendations:  Other:  This patient does not meet criteria for inpatient psychiatric treatment. Safe for outpatient treatment with current providers. [employer] aware that this  patient would benefit from an ACT Team and will pursue this for him.   Disposition: Psychiatrically cleared for discharge home. Patient agrees with this plan. Safety planning reviewed in detail.    Chalmers Guest, NP 04/05/2021, 1:45 PM

## 2021-04-05 NOTE — BH Assessment (Signed)
[facility] Assessment Progress Note   Per Pecolia Ades, NP this voluntary pt is psychiatrically cleared.  Discharge instructions advise pt to continue treatment with [facility] and with the [facility].  At 13:30 I called the later, again informing them that we are recommending ACT Team services for him.  They agree to pursue this.  EDP Jory Sims, DO and pt's nurse, Meghan, have been notified.  Jalene Mullet, [place] Coordinator [phone]

## 2021-04-18 ENCOUNTER — Other Ambulatory Visit: Payer: Self-pay

## 2021-04-18 ENCOUNTER — Emergency Department (HOSPITAL_COMMUNITY)
Admission: EM | Admit: 2021-04-18 | Discharge: 2021-04-18 | Disposition: A | Discharge status: Home / Self Care | Payer: Medicaid Other | Attending: Emergency Medicine | Admitting: Emergency Medicine

## 2021-04-18 ENCOUNTER — Encounter (HOSPITAL_COMMUNITY): Payer: Self-pay | Admitting: Emergency Medicine

## 2021-04-18 DIAGNOSIS — I10 Essential (primary) hypertension: Secondary | ICD-10-CM | POA: Insufficient documentation

## 2021-04-18 DIAGNOSIS — Z79899 Other long term (current) drug therapy: Secondary | ICD-10-CM | POA: Insufficient documentation

## 2021-04-18 DIAGNOSIS — Z87891 Personal history of nicotine dependence: Secondary | ICD-10-CM | POA: Diagnosis not present

## 2021-04-18 DIAGNOSIS — R070 Pain in throat: Secondary | ICD-10-CM

## 2021-04-18 DIAGNOSIS — M542 Cervicalgia: Secondary | ICD-10-CM | POA: Diagnosis not present

## 2021-04-18 DIAGNOSIS — F419 Anxiety disorder, unspecified: Secondary | ICD-10-CM | POA: Diagnosis not present

## 2021-04-18 DIAGNOSIS — R519 Headache, unspecified: Secondary | ICD-10-CM | POA: Diagnosis not present

## 2021-04-18 DIAGNOSIS — F0390 Unspecified dementia without behavioral disturbance: Secondary | ICD-10-CM | POA: Insufficient documentation

## 2021-04-18 LAB — BASIC METABOLIC PANEL
Anion gap: 9 (ref 5–15)
BUN: 5 mg/dL — ABNORMAL LOW (ref 8–23)
CO2: 29 mmol/L (ref 22–32)
Calcium: 9.2 mg/dL (ref 8.9–10.3)
Chloride: 94 mmol/L — ABNORMAL LOW (ref 98–111)
Creatinine, Ser: 0.85 mg/dL (ref 0.61–1.24)
GFR, Estimated: >60 mL/min (ref >60)
Glucose, Bld: 93 mg/dL (ref 70–99)
Potassium: 3.8 mmol/L (ref 3.5–5.1)
Sodium: 132 mmol/L — ABNORMAL LOW (ref 135–145)

## 2021-04-18 LAB — URINALYSIS, ROUTINE W REFLEX MICROSCOPIC
Bacteria, UA: NONE SEEN
Bilirubin Urine: NEGATIVE
Glucose, UA: NEGATIVE mg/dL
Ketones, ur: NEGATIVE mg/dL
Leukocytes,Ua: NEGATIVE
Nitrite: NEGATIVE
Protein, ur: NEGATIVE mg/dL
Specific Gravity, Urine: 1.004 — ABNORMAL LOW (ref 1.005–1.030)
pH: 8 (ref 5.0–8.0)

## 2021-04-18 LAB — CBC
HCT: 42.7 % (ref 39.0–52.0)
Hemoglobin: 14.5 g/dL (ref 13.0–17.0)
MCH: 32.2 pg (ref 26.0–34.0)
MCHC: 34 g/dL (ref 30.0–36.0)
MCV: 94.9 fL (ref 80.0–100.0)
Platelets: 308 10*3/uL (ref 150–400)
RBC: 4.5 MIL/uL (ref 4.22–5.81)
RDW: 12.2 % (ref 11.5–15.5)
WBC: 8 10*3/uL (ref 4.0–10.5)
nRBC: 0 % (ref 0.0–0.2)

## 2021-04-18 LAB — CBG MONITORING, ED: Glucose-Capillary: 96 mg/dL (ref 70–99)

## 2021-04-18 MED ORDER — SODIUM CHLORIDE 0.9 % IV BOLUS
1000.0000 mL | Freq: Once | INTRAVENOUS | Status: AC
  Administered 2021-04-18: 1000 mL via INTRAVENOUS

## 2021-04-18 MED ORDER — ALUM & MAG HYDROXIDE-SIMETH 200-200-20 MG/5ML PO SUSP
30.0000 mL | Freq: Once | ORAL | Status: AC
  Administered 2021-04-18: 30 mL via ORAL
  Filled 2021-04-18: qty 30

## 2021-04-18 MED ORDER — ACETAMINOPHEN 325 MG PO TABS
650.0000 mg | ORAL_TABLET | Freq: Once | ORAL | Status: AC
  Administered 2021-04-18: 650 mg via ORAL
  Filled 2021-04-18: qty 2

## 2021-04-18 MED ORDER — LORAZEPAM 0.5 MG PO TABS
0.5000 mg | ORAL_TABLET | Freq: Once | ORAL | Status: AC
  Administered 2021-04-18: 0.5 mg via ORAL
  Filled 2021-04-18: qty 1

## 2021-04-18 MED ORDER — LIDOCAINE VISCOUS HCL 2 % MT SOLN
15.0000 mL | Freq: Once | OROMUCOSAL | Status: AC
  Administered 2021-04-18: 15 mL via ORAL
  Filled 2021-04-18: qty 15

## 2021-04-18 MED ORDER — FAMOTIDINE 20 MG PO TABS: 20.0000 mg | ORAL_TABLET | Freq: Two times a day (BID) | ORAL | 0 refills | Status: DC

## 2021-04-18 NOTE — ED Provider Notes (Signed)
[facility] DEPT Provider Note   CSN: [id number] Arrival date & time: 04/18/21  0554     History Chief Complaint  Patient presents with   throat pain    Anthony Lamb is a 65 y.o. male.  Patient with history of alcohol use, gastric ulcers, suicidal ideation --presents to the emergency department today for evaluation of throat and chest pain.  Patient states that he has had increasing "throbbing pain" in his throat over the past several days.  It got to the point this morning where he needed to come to the emergency department and called the ambulance.  He states that he has been eating and drinking less due to the throat pain.  It sounds like he does continue to drink alcohol.  Also complains of pain in his neck and posterior headache pain.  No vomiting or diarrhea.  Denies heavy NSAID use.  Denies fevers or chills.  Patient claims that he has throat cancer, but then states that this has not been diagnosed and he has a EGD coming up in about 2 weeks.  EGD in 2020 showed gastric polyps. The onset of this condition was acute. The course is constant. Aggravating factors: none. Alleviating factors: none.        Past Medical History:  Diagnosis Date   Alcohol abuse, in remission    Anal fissure    Anemia    Ankylosing spondylitis (HCC)    Anxiety    Arthritis    Chronic diarrhea    Chronic headaches    Chronic pain    Colitis    Colon polyps    Depression    Esophagitis    Gastric polyps    hyperplastic and fundic gland   Gastritis    GERD (gastroesophageal reflux disease)    Hypertension    IBS (irritable bowel syndrome)    Poor dentition    SIADH (syndrome of inappropriate ADH production) ([place])     Patient Active Problem List   Diagnosis Date Noted   Suicidal ideation 04/05/2021   MDD (major depressive disorder), recurrent episode, severe ([place]) 03/31/2021   Substance induced mood disorder ([place]) 03/31/2021   Family history of colon  cancer 12/02/2018   Gastric polyps    Schatzki's ring    Left sided ulcerative colitis without complication ([place])    Odynophagia 11/25/2018   Dysphagia 11/25/2018   Major depressive disorder, recurrent severe without psychotic features ([place]) 06/24/2018   IBS (irritable bowel syndrome)    Hypertension    Colitis    Adjustment disorder with mixed anxiety and depressed mood 05/11/2017   Alcohol abuse    Anxiety    Gastroesophageal reflux disease    Osteopenia determined by x-ray 08/06/2014   Stress fracture of calcaneus 08/06/2014   Vitamin D deficiency 12/18/2013   Dementia ([place]) 12/18/2013   Benign microscopic hematuria 07/06/2013   SIADH (syndrome of inappropriate ADH production) ([place]) 06/22/2013   Ankylosing spondylitis ([place]) 06/20/2013   Malnutrition of moderate degree ([place]) 06/20/2013   BPH (benign prostatic hyperplasia) 08/22/2010   History of colonic polyps 08/13/2008   Essential hypertension 07/03/2007   PUD (peptic ulcer disease) 07/03/2007    Past Surgical History:  Procedure Laterality Date   BIOPSY  11/26/2018   Procedure: BIOPSY;  Surgeon: Gatha Mayer, MD;  Location: [facility];  Service: Endoscopy;;   COLONOSCOPY W/ BIOPSIES     COLONOSCOPY WITH PROPOFOL N/A 11/26/2018   Procedure: COLONOSCOPY WITH PROPOFOL;  Surgeon: Gatha Mayer, MD;  Location: [facility];  Service: Endoscopy;  Laterality: N/A;   ESOPHAGOGASTRODUODENOSCOPY     ESOPHAGOGASTRODUODENOSCOPY (EGD) WITH PROPOFOL N/A 11/26/2018   Procedure: ESOPHAGOGASTRODUODENOSCOPY (EGD) WITH PROPOFOL;  Surgeon: Gatha Mayer, MD;  Location: [facility];  Service: Endoscopy;  Laterality: N/A;   HEMOSTASIS CLIP PLACEMENT  11/26/2018   Procedure: HEMOSTASIS CLIP PLACEMENT;  Surgeon: Gatha Mayer, MD;  Location: [facility];  Service: Endoscopy;;   HERNIA REPAIR Bilateral    HOT HEMOSTASIS N/A 11/26/2018   Procedure: HOT HEMOSTASIS (ARGON PLASMA COAGULATION/BICAP);  Surgeon: Gatha Mayer, MD;  Location:  [facility];  Service: Endoscopy;  Laterality: N/A;   OPEN REDUCTION INTERNAL FIXATION (ORIF) DISTAL RADIAL FRACTURE Right 02/11/2018   Procedure: OPEN REDUCTION INTERNAL FIXATION (ORIF) DISTAL RADIAL FRACTURE;  Surgeon: Milly Jakob, MD;  Location: [facility];  Service: Orthopedics;  Laterality: Right;   POLYPECTOMY  11/26/2018   Procedure: POLYPECTOMY;  Surgeon: Gatha Mayer, MD;  Location: [facility];  Service: Endoscopy;;   TONSILLECTOMY         Family History  Problem Relation Age of Onset   Anxiety disorder Mother    Congestive Heart Failure Mother    Crohn's disease Mother    Colon cancer Mother 69   Arthritis Father    High blood pressure Father    Crohn's disease Father     Social History   Tobacco Use   Smoking status: Former    Types: Cigarettes    Quit date: 2014    Years since quitting: 8.8   Smokeless tobacco: Never  Vaping Use   Vaping Use: Never used  Substance Use Topics   Alcohol use: Yes    Comment: 2 25 oz of beer a night   Drug use: Yes    Types: Marijuana    Comment: other night    Home Medications Prior to Admission medications   Medication Sig Start Date End Date Taking? Authorizing Provider  acetaminophen (TYLENOL) 500 MG tablet Take 500-1,000 mg by mouth every 6 (six) hours as needed (for pain).    [provider]  clonazePAM (KLONOPIN) 0.5 MG tablet Take 0.5 mg by mouth 3 (three) times daily.    [provider]  clonazePAM (KLONOPIN) 1 MG tablet Take 1 mg by mouth 3 (three) times daily as needed. Patient not taking: No sig reported 02/11/21   [provider]  diclofenac Sodium (VOLTAREN) 1 % GEL Apply 2 g topically 4 (four) times daily as needed (to painful sites).    [provider]  DULoxetine (CYMBALTA) 30 MG capsule Take 60 mg by mouth daily. Patient not taking: Reported on 04/04/2021 03/06/21   [provider]  lisinopril (PRINIVIL,ZESTRIL) 10 MG tablet Take 1 tablet (10 mg total) by mouth  daily. 08/21/17 06/04/21  Melanee Spry, MD  meloxicam (MOBIC) 15 MG tablet Take 15 mg by mouth daily.    [provider]  methocarbamol (ROBAXIN) 500 MG tablet Take 500 mg by mouth every 8 (eight) hours as needed for muscle spasms.    [provider]  Multiple Vitamin (MULTIVITAMIN WITH MINERALS) TABS tablet Take 1 tablet by mouth daily. (May buy over the counter) Patient not taking: Reported on 04/04/2021 06/28/18   Connye Burkitt, NP  ondansetron (ZOFRAN ODT) 4 MG disintegrating tablet Take 1 tablet (4 mg total) by mouth every 4 (four) hours as needed for nausea or vomiting. Patient taking differently: Take 4 mg by mouth every 4 (four) hours as needed for nausea or vomiting (  DISSOLVE ORALLY). 03/24/21   Charlesetta Shanks, MD  pantoprazole (PROTONIX) 40 MG tablet Take 1 tablet (40 mg total) by mouth daily. Patient taking differently: Take 40 mg by mouth in the morning. 04/24/14   Janith Lima, MD  QUEtiapine (SEROQUEL) 300 MG tablet Take 300 mg by mouth at bedtime.    [provider]  sucralfate (CARAFATE) 1 GM/10ML suspension Take 10 mLs (1 g total) by mouth 4 (four) times daily -  with meals and at bedtime. 03/24/21   Charlesetta Shanks, MD  tiZANidine (ZANAFLEX) 4 MG tablet Take 4 mg by mouth 3 (three) times daily as needed for muscle spasms. 03/05/21   [provider]  verapamil (CALAN-SR) 240 MG CR tablet Take 240 mg by mouth daily. 02/09/17   [provider]  zolpidem (AMBIEN) 5 MG tablet Take 5 mg by mouth at bedtime.    [provider]    Allergies    Nsaids, Diphenhydramine hcl, Flexeril [cyclobenzaprine], Hctz [hydrochlorothiazide], Rexulti [brexpiprazole], Seroquel [quetiapine], Gabapentin, and Hydroxyzine  Review of Systems   Review of Systems  Constitutional:  Positive for fatigue. Negative for fever.  HENT:  Positive for sore throat. Negative for rhinorrhea.   Eyes:  Negative for redness.  Respiratory:  Negative for cough.    Cardiovascular:  Negative for chest pain.  Gastrointestinal:  Negative for abdominal pain, diarrhea, nausea and vomiting.  Genitourinary:  Negative for dysuria and hematuria.  Musculoskeletal:  Positive for neck pain. Negative for myalgias and neck stiffness.  Skin:  Negative for rash.  Neurological:  Positive for headaches.  Psychiatric/Behavioral:  The patient is nervous/anxious.    Physical Exam Updated Vital Signs BP (!) 166/90   Pulse 75   Temp 98.3 F (36.8 C) (Oral)   Resp 18   Ht 5' 10"  (1.778 m)   Wt 69.4 kg   SpO2 98%   BMI 21.95 kg/m   Physical Exam Vitals and nursing note reviewed.  Constitutional:      General: He is not in acute distress.    Appearance: He is well-developed.  HENT:     Head: Normocephalic and atraumatic.     Mouth/Throat:     Mouth: Mucous membranes are moist.     Pharynx: No oropharyngeal exudate.     Comments: Normal posterior oropharynx Eyes:     General:        Right eye: No discharge.        Left eye: No discharge.     Conjunctiva/sclera: Conjunctivae normal.  Cardiovascular:     Rate and Rhythm: Normal rate and regular rhythm.     Heart sounds: Normal heart sounds.  Pulmonary:     Effort: Pulmonary effort is normal.     Breath sounds: Normal breath sounds.  Abdominal:     Palpations: Abdomen is soft.     Tenderness: There is no abdominal tenderness. There is no guarding or rebound.  Musculoskeletal:     Cervical back: Normal range of motion and neck supple.  Skin:    General: Skin is warm and dry.  Neurological:     Mental Status: He is alert.    ED Results / Procedures / Treatments   Labs (all labs ordered are listed, but only abnormal results are displayed) Labs Reviewed  BASIC METABOLIC PANEL - Abnormal; Notable for the following components:      Result Value   Sodium 132 (*)    Chloride 94 (*)    BUN 5 (*)    All  other components within normal limits  URINALYSIS, ROUTINE W REFLEX MICROSCOPIC - Abnormal; Notable  for the following components:   Color, Urine STRAW (*)    Specific Gravity, Urine 1.004 (*)    Hgb urine dipstick SMALL (*)    All other components within normal limits  CBC  CBG MONITORING, ED    EKG EKG Interpretation  Date/Time:  Monday April 18 2021 06:05:18 EST Ventricular Rate:  72 PR Interval:  186 QRS Duration: 117 QT Interval:  404 QTC Calculation: 443 R Axis:   36 Text Interpretation: Sinus rhythm Ventricular premature complex Consider left atrial enlargement Nonspecific intraventricular conduction delay PVC not seen previously Confirmed by Shanon Rosser [id number]) on 04/18/2021 6:29:43 AM  Radiology No results found.  Procedures Procedures   Medications Ordered in ED Medications  alum & mag hydroxide-simeth (MAALOX/MYLANTA) 200-200-20 MG/5ML suspension 30 mL (30 mLs Oral Given 04/18/21 0653)    And  lidocaine (XYLOCAINE) 2 % viscous mouth solution 15 mL (15 mLs Oral Given 04/18/21 0653)  LORazepam (ATIVAN) tablet 0.5 mg (0.5 mg Oral Given 04/18/21 0652)  sodium chloride 0.9 % bolus 1,000 mL (0 mLs Intravenous Stopped 04/18/21 0759)  acetaminophen (TYLENOL) tablet 650 mg (650 mg Oral Given 04/18/21 0751)  LORazepam (ATIVAN) tablet 0.5 mg (0.5 mg Oral Given 04/18/21 6010)    ED Course  I have reviewed the triage vital signs and the nursing notes.  Pertinent labs & imaging results that were available during my care of the patient were reviewed by me and considered in my medical decision making (see chart for details).  Patient seen and examined.  Labs ordered.  Will give IV fluid bolus.  Patient is handling his secretions.  Requesting medication for anxiety.  Vital signs reviewed and are as follows: BP 131/88   Pulse 74   Temp 98.3 F (36.8 C) (Oral)   Resp 20   Ht 5' 10"  (1.778 m)   Wt 69.4 kg   SpO2 98%   BMI 21.95 kg/m   Lab work reviewed.  Overall reassuring.  Patient was given a second dose of oral Ativan per his request for anxiety.  Discussed  that lab work is reassuring.  No indication for further imaging today.  Encouraged that he follow-up with GI as planned for EGD.  He is already on PPI and Carafate.  We will add H2 blocker.  The patient was urged to return to the Emergency Department immediately with worsening of current symptoms, worsening abdominal pain, persistent vomiting, blood noted in stools, fever, or any other concerns. The patient verbalized understanding.     MDM Rules/Calculators/A&P                           Patient with throat pain and reported difficulty swallowing although he is handling his secretions and is not having any regurgitation.  Lab work-up today with mild hyponatremia, treated with IV fluids.  Normal creatinine.  He does not appear to be dehydrated.  He is eating crackers and drinking in the room.  At this point, no emergent life-threatening problems identified and no indications for admission.  Strongly encouraged PCP/GI follow-up.  Patient states that he has had cancer but this does not appear to have been confirmed.  Would not be surprised if he had gastritis/PUD or esophagitis due to regular alcohol intake.  Final Clinical Impression(s) / ED Diagnoses Final diagnoses:  Throat pain  Anxiety    Rx / DC Orders ED Discharge  Orders     None        Carlisle Cater, PA-C 04/18/21 1954    Lennice Sites, DO 04/18/21 1004

## 2021-04-18 NOTE — ED Triage Notes (Signed)
Pt arrived via EMS from home. Pt states he has hx of throat cancer and bladder cancer. Pt has felt "run down" for a few months and for the past few days pt has not felt like eating or drinking. Pt reports neck pain, headache, beginning 3 days ago. Pt reports he has never had treatment for cancer. Pt reports extreme anxiety.

## 2021-04-18 NOTE — Discharge Instructions (Addendum)
Please read and follow all provided instructions.  Your diagnoses today include:  1. Throat pain   2. Anxiety     Tests performed today include: Blood cell counts and platelets Kidney tests Urine test to look for infection Vital signs. See below for your results today.   Medications prescribed:  Pepcid (famotidine) - antihistamine  You can find this medication over-the-counter.   DO NOT exceed:  44m Pepcid every 12 hours  Take any prescribed medications only as directed.  Home care instructions:  Follow any educational materials contained in this packet.  Follow-up instructions: Please follow-up with your primary care and gastroenterologist as planned.  Return instructions:  SEEK IMMEDIATE MEDICAL ATTENTION IF: The pain does not go away or becomes severe  A temperature above 101F develops  Repeated vomiting occurs (multiple episodes)  The pain becomes localized to portions of the abdomen. The right side could possibly be appendicitis. In an adult, the left lower portion of the abdomen could be colitis or diverticulitis.  Blood is being passed in stools or vomit (bright red or black tarry stools)  You develop chest pain, difficulty breathing, dizziness or fainting, or become confused, poorly responsive, or inconsolable (young children) If you have any other emergent concerns regarding your health  Additional Information: Abdominal (belly) pain can be caused by many things. Your caregiver performed an examination and possibly ordered blood/urine tests and imaging (CT scan, x-rays, ultrasound). Many cases can be observed and treated at home after initial evaluation in the emergency department. Even though you are being discharged home, abdominal pain can be unpredictable. Therefore, you need a repeated exam if your pain does not resolve, returns, or worsens. Most patients with abdominal pain don't have to be admitted to the hospital or have surgery, but serious problems like  appendicitis and gallbladder attacks can start out as nonspecific pain. Many abdominal conditions cannot be diagnosed in one visit, so follow-up evaluations are very important.  Your vital signs today were: BP 131/88   Pulse 74   Temp 98.3 F (36.8 C) (Oral)   Resp 20   Ht 5' 10"  (1.778 m)   Wt 69.4 kg   SpO2 98%   BMI 21.95 kg/m  If your blood pressure (bp) was elevated above 135/85 this visit, please have this repeated by your doctor within one month. --------------

## 2021-04-21 ENCOUNTER — Other Ambulatory Visit: Payer: Self-pay

## 2021-04-21 ENCOUNTER — Encounter (HOSPITAL_COMMUNITY): Payer: Self-pay | Admitting: Emergency Medicine

## 2021-04-21 ENCOUNTER — Emergency Department (HOSPITAL_COMMUNITY)
Admission: EM | Admit: 2021-04-21 | Discharge: 2021-04-21 | Disposition: A | Discharge status: Home / Self Care | Payer: Medicaid Other | Attending: Emergency Medicine | Admitting: Emergency Medicine

## 2021-04-21 DIAGNOSIS — F0390 Unspecified dementia without behavioral disturbance: Secondary | ICD-10-CM | POA: Insufficient documentation

## 2021-04-21 DIAGNOSIS — R45851 Suicidal ideations: Secondary | ICD-10-CM | POA: Insufficient documentation

## 2021-04-21 DIAGNOSIS — I10 Essential (primary) hypertension: Secondary | ICD-10-CM | POA: Insufficient documentation

## 2021-04-21 DIAGNOSIS — F29 Unspecified psychosis not due to a substance or known physiological condition: Secondary | ICD-10-CM | POA: Insufficient documentation

## 2021-04-21 DIAGNOSIS — Z87891 Personal history of nicotine dependence: Secondary | ICD-10-CM | POA: Diagnosis not present

## 2021-04-21 DIAGNOSIS — Z79899 Other long term (current) drug therapy: Secondary | ICD-10-CM | POA: Insufficient documentation

## 2021-04-21 DIAGNOSIS — F19939 Other psychoactive substance use, unspecified with withdrawal, unspecified: Secondary | ICD-10-CM | POA: Insufficient documentation

## 2021-04-21 DIAGNOSIS — F1910 Other psychoactive substance abuse, uncomplicated: Secondary | ICD-10-CM | POA: Diagnosis present

## 2021-04-21 DIAGNOSIS — Z20822 Contact with and (suspected) exposure to covid-19: Secondary | ICD-10-CM | POA: Insufficient documentation

## 2021-04-21 LAB — COMPREHENSIVE METABOLIC PANEL
ALT: 12 U/L (ref 0–44)
AST: 18 U/L (ref 15–41)
Albumin: 4.1 g/dL (ref 3.5–5.0)
Alkaline Phosphatase: 61 U/L (ref 38–126)
Anion gap: 10 (ref 5–15)
BUN: <5 mg/dL — ABNORMAL LOW (ref 8–23)
CO2: 24 mmol/L (ref 22–32)
Calcium: 8.9 mg/dL (ref 8.9–10.3)
Chloride: 97 mmol/L — ABNORMAL LOW (ref 98–111)
Creatinine, Ser: 0.71 mg/dL (ref 0.61–1.24)
GFR, Estimated: >60 mL/min (ref >60)
Glucose, Bld: 103 mg/dL — ABNORMAL HIGH (ref 70–99)
Potassium: 3.9 mmol/L (ref 3.5–5.1)
Sodium: 131 mmol/L — ABNORMAL LOW (ref 135–145)
Total Bilirubin: 0.4 mg/dL (ref 0.3–1.2)
Total Protein: 7 g/dL (ref 6.5–8.1)

## 2021-04-21 LAB — CBC
HCT: 42.9 % (ref 39.0–52.0)
Hemoglobin: 14.4 g/dL (ref 13.0–17.0)
MCH: 31.5 pg (ref 26.0–34.0)
MCHC: 33.6 g/dL (ref 30.0–36.0)
MCV: 93.9 fL (ref 80.0–100.0)
Platelets: 309 10*3/uL (ref 150–400)
RBC: 4.57 MIL/uL (ref 4.22–5.81)
RDW: 12.4 % (ref 11.5–15.5)
WBC: 7.3 10*3/uL (ref 4.0–10.5)
nRBC: 0 % (ref 0.0–0.2)

## 2021-04-21 LAB — RESP PANEL BY RT-PCR (FLU A&B, COVID) ARPGX2
Influenza A by PCR: NEGATIVE
Influenza B by PCR: NEGATIVE
SARS Coronavirus 2 by RT PCR: NEGATIVE

## 2021-04-21 LAB — ACETAMINOPHEN LEVEL: Acetaminophen (Tylenol), Serum: <10 ug/mL — ABNORMAL LOW (ref 10–30)

## 2021-04-21 LAB — ETHANOL: Alcohol, Ethyl (B): <10 mg/dL (ref <10)

## 2021-04-21 LAB — RAPID URINE DRUG SCREEN, HOSP PERFORMED
Amphetamines: NOT DETECTED
Barbiturates: NOT DETECTED
Benzodiazepines: POSITIVE — AB
Cocaine: NOT DETECTED
Opiates: NOT DETECTED
Tetrahydrocannabinol: POSITIVE — AB

## 2021-04-21 LAB — SALICYLATE LEVEL: Salicylate Lvl: <7 mg/dL — ABNORMAL LOW (ref 7.0–30.0)

## 2021-04-21 MED ORDER — FAMOTIDINE 20 MG PO TABS
20.0000 mg | ORAL_TABLET | Freq: Two times a day (BID) | ORAL | Status: DC
  Administered 2021-04-21: 09:00:00 20 mg via ORAL
  Filled 2021-04-21: qty 1

## 2021-04-21 MED ORDER — LORAZEPAM 1 MG PO TABS: 0.0000 mg | ORAL_TABLET | Freq: Two times a day (BID) | ORAL | Status: DC

## 2021-04-21 MED ORDER — LORAZEPAM 2 MG/ML IJ SOLN: 0.0000 mg | Freq: Two times a day (BID) | INTRAMUSCULAR | Status: DC

## 2021-04-21 MED ORDER — BREXPIPRAZOLE 2 MG PO TABS
2.0000 mg | ORAL_TABLET | Freq: Every day | ORAL | Status: DC
  Administered 2021-04-21: 11:00:00 2 mg via ORAL
  Filled 2021-04-21: qty 1

## 2021-04-21 MED ORDER — LORAZEPAM 2 MG/ML IJ SOLN: 0.0000 mg | Freq: Four times a day (QID) | INTRAMUSCULAR | Status: DC

## 2021-04-21 MED ORDER — ZOLPIDEM TARTRATE 5 MG PO TABS: 5.0000 mg | ORAL_TABLET | Freq: Every day | ORAL | Status: DC

## 2021-04-21 MED ORDER — METHOCARBAMOL 500 MG PO TABS
500.0000 mg | ORAL_TABLET | Freq: Three times a day (TID) | ORAL | Status: DC | PRN
  Administered 2021-04-21: 09:00:00 500 mg via ORAL
  Filled 2021-04-21: qty 1

## 2021-04-21 MED ORDER — THIAMINE HCL 100 MG PO TABS
100.0000 mg | ORAL_TABLET | Freq: Every day | ORAL | Status: DC
  Administered 2021-04-21: 09:00:00 100 mg via ORAL
  Filled 2021-04-21: qty 1

## 2021-04-21 MED ORDER — PANTOPRAZOLE SODIUM 40 MG PO TBEC
40.0000 mg | DELAYED_RELEASE_TABLET | Freq: Every day | ORAL | Status: DC
  Administered 2021-04-21: 09:00:00 40 mg via ORAL

## 2021-04-21 MED ORDER — THIAMINE HCL 100 MG/ML IJ SOLN: 100.0000 mg | Freq: Every day | INTRAMUSCULAR | Status: DC

## 2021-04-21 MED ORDER — MELOXICAM 15 MG PO TABS
15.0000 mg | ORAL_TABLET | Freq: Every day | ORAL | Status: DC
  Administered 2021-04-21: 11:00:00 15 mg via ORAL
  Filled 2021-04-21: qty 1

## 2021-04-21 MED ORDER — LORAZEPAM 1 MG PO TABS
0.0000 mg | ORAL_TABLET | Freq: Four times a day (QID) | ORAL | Status: DC
  Administered 2021-04-21: 08:00:00 1 mg via ORAL
  Filled 2021-04-21: qty 1

## 2021-04-21 MED ORDER — ACETAMINOPHEN 500 MG PO TABS
500.0000 mg | ORAL_TABLET | Freq: Four times a day (QID) | ORAL | Status: DC | PRN
Start: 2021-04-21 — End: 2021-04-21

## 2021-04-21 MED ORDER — DICLOFENAC SODIUM 1 % EX GEL: 2.0000 g | Freq: Four times a day (QID) | CUTANEOUS | Status: DC | PRN

## 2021-04-21 NOTE — ED Notes (Signed)
Pt has two belonging bags in cabinet under ice machine in triage

## 2021-04-21 NOTE — ED Provider Notes (Addendum)
[facility] DEPT Provider Note   CSN: [id number] Arrival date & time: 04/21/21  7824     History Chief Complaint  Patient presents with   Suicidal    Anthony Lamb is a 65 y.o. male with a past medical history of alcohol misuse disorder, ankylosing spondylitis, hypertension, gastritis, esophagitis, and a recent diagnosis of throat cancer.  Patient states that he has been out of his "sleeping medications" for the past 3 days.  He says that when he does not take his sleeping medication he gets the shakes and he has been drinking more which has worsened to that.  He states that he feels that he does not want a live anymore.  He is thought about slitting his wrists.  He has a history of suicidal ideation and substance induced mood disorder.  He denies audiovisual hallucinations or homicidal ideation.  HPI     Past Medical History:  Diagnosis Date   Alcohol abuse, in remission    Anal fissure    Anemia    Ankylosing spondylitis (HCC)    Anxiety    Arthritis    Chronic diarrhea    Chronic headaches    Chronic pain    Colitis    Colon polyps    Depression    Esophagitis    Gastric polyps    hyperplastic and fundic gland   Gastritis    GERD (gastroesophageal reflux disease)    Hypertension    IBS (irritable bowel syndrome)    Poor dentition    SIADH (syndrome of inappropriate ADH production) ([place])     Patient Active Problem List   Diagnosis Date Noted   Suicidal ideation 04/05/2021   MDD (major depressive disorder), recurrent episode, severe ([place]) 03/31/2021   Substance induced mood disorder ([place]) 03/31/2021   Family history of colon cancer 12/02/2018   Gastric polyps    Schatzki's ring    Left sided ulcerative colitis without complication ([place])    Odynophagia 11/25/2018   Dysphagia 11/25/2018   Major depressive disorder, recurrent severe without psychotic features ([place]) 06/24/2018   IBS (irritable bowel syndrome)    Hypertension     Colitis    Adjustment disorder with mixed anxiety and depressed mood 05/11/2017   Alcohol abuse    Anxiety    Gastroesophageal reflux disease    Osteopenia determined by x-ray 08/06/2014   Stress fracture of calcaneus 08/06/2014   Vitamin D deficiency 12/18/2013   Dementia ([place]) 12/18/2013   Benign microscopic hematuria 07/06/2013   SIADH (syndrome of inappropriate ADH production) ([place]) 06/22/2013   Ankylosing spondylitis ([place]) 06/20/2013   Malnutrition of moderate degree ([place]) 06/20/2013   BPH (benign prostatic hyperplasia) 08/22/2010   History of colonic polyps 08/13/2008   Essential hypertension 07/03/2007   PUD (peptic ulcer disease) 07/03/2007    Past Surgical History:  Procedure Laterality Date   BIOPSY  11/26/2018   Procedure: BIOPSY;  Surgeon: Gatha Mayer, MD;  Location: [facility];  Service: Endoscopy;;   COLONOSCOPY W/ BIOPSIES     COLONOSCOPY WITH PROPOFOL N/A 11/26/2018   Procedure: COLONOSCOPY WITH PROPOFOL;  Surgeon: Gatha Mayer, MD;  Location: [facility];  Service: Endoscopy;  Laterality: N/A;   ESOPHAGOGASTRODUODENOSCOPY     ESOPHAGOGASTRODUODENOSCOPY (EGD) WITH PROPOFOL N/A 11/26/2018   Procedure: ESOPHAGOGASTRODUODENOSCOPY (EGD) WITH PROPOFOL;  Surgeon: Gatha Mayer, MD;  Location: [facility];  Service: Endoscopy;  Laterality: N/A;   HEMOSTASIS CLIP PLACEMENT  11/26/2018   Procedure: HEMOSTASIS CLIP PLACEMENT;  Surgeon: Carlean Purl,  Ofilia Neas, MD;  Location: [facility];  Service: Endoscopy;;   HERNIA REPAIR Bilateral    HOT HEMOSTASIS N/A 11/26/2018   Procedure: HOT HEMOSTASIS (ARGON PLASMA COAGULATION/BICAP);  Surgeon: Gatha Mayer, MD;  Location: [facility];  Service: Endoscopy;  Laterality: N/A;   OPEN REDUCTION INTERNAL FIXATION (ORIF) DISTAL RADIAL FRACTURE Right 02/11/2018   Procedure: OPEN REDUCTION INTERNAL FIXATION (ORIF) DISTAL RADIAL FRACTURE;  Surgeon: Milly Jakob, MD;  Location: [facility];  Service: Orthopedics;  Laterality: Right;    POLYPECTOMY  11/26/2018   Procedure: POLYPECTOMY;  Surgeon: Gatha Mayer, MD;  Location: [facility];  Service: Endoscopy;;   TONSILLECTOMY         Family History  Problem Relation Age of Onset   Anxiety disorder Mother    Congestive Heart Failure Mother    Crohn's disease Mother    Colon cancer Mother 33   Arthritis Father    High blood pressure Father    Crohn's disease Father     Social History   Tobacco Use   Smoking status: Former    Types: Cigarettes    Quit date: 2014    Years since quitting: 8.8   Smokeless tobacco: Never  Vaping Use   Vaping Use: Never used  Substance Use Topics   Alcohol use: Yes    Comment: 2 25 oz of beer a night   Drug use: Yes    Types: Marijuana    Comment: other night    Home Medications Prior to Admission medications   Medication Sig Start Date End Date Taking? Authorizing Provider  acetaminophen (TYLENOL) 500 MG tablet Take 500-1,000 mg by mouth every 6 (six) hours as needed (for pain).    [provider]  clonazePAM (KLONOPIN) 0.5 MG tablet Take 0.5 mg by mouth 3 (three) times daily.    [provider]  clonazePAM (KLONOPIN) 1 MG tablet Take 1 mg by mouth 3 (three) times daily as needed. Patient not taking: No sig reported 02/11/21   [provider]  diclofenac Sodium (VOLTAREN) 1 % GEL Apply 2 g topically 4 (four) times daily as needed (to painful sites).    [provider]  DULoxetine (CYMBALTA) 30 MG capsule Take 60 mg by mouth daily. Patient not taking: Reported on 04/04/2021 03/06/21   [provider]  famotidine (PEPCID) 20 MG tablet Take 1 tablet (20 mg total) by mouth 2 (two) times daily. 04/18/21   Carlisle Cater, PA-C  lisinopril (PRINIVIL,ZESTRIL) 10 MG tablet Take 1 tablet (10 mg total) by mouth daily. 08/21/17 06/04/21  Melanee Spry, MD  meloxicam (MOBIC) 15 MG tablet Take 15 mg by mouth daily.    [provider]  methocarbamol (ROBAXIN) 500 MG tablet Take 500  mg by mouth every 8 (eight) hours as needed for muscle spasms.    [provider]  Multiple Vitamin (MULTIVITAMIN WITH MINERALS) TABS tablet Take 1 tablet by mouth daily. (May buy over the counter) Patient not taking: Reported on 04/04/2021 06/28/18   Connye Burkitt, NP  ondansetron (ZOFRAN ODT) 4 MG disintegrating tablet Take 1 tablet (4 mg total) by mouth every 4 (four) hours as needed for nausea or vomiting. Patient taking differently: Take 4 mg by mouth every 4 (four) hours as needed for nausea or vomiting (DISSOLVE ORALLY). 03/24/21   Charlesetta Shanks, MD  pantoprazole (PROTONIX) 40 MG tablet Take 1 tablet (40 mg total) by mouth daily. Patient taking differently: Take 40 mg by mouth in the morning. 04/24/14  Janith Lima, MD  propranolol (INDERAL) 10 MG tablet Take 10 mg by mouth 2 (two) times daily. 04/05/21   [provider]  QUEtiapine (SEROQUEL) 300 MG tablet Take 300 mg by mouth at bedtime.    [provider]  REXULTI 2 MG TABS tablet Take 2 mg by mouth daily. 04/05/21   [provider]  sucralfate (CARAFATE) 1 g tablet Take 1 g by mouth 4 (four) times daily. 04/05/21   [provider]  sucralfate (CARAFATE) 1 GM/10ML suspension Take 10 mLs (1 g total) by mouth 4 (four) times daily -  with meals and at bedtime. 03/24/21   Charlesetta Shanks, MD  tiZANidine (ZANAFLEX) 4 MG tablet Take 4 mg by mouth 3 (three) times daily as needed for muscle spasms. 03/05/21   [provider]  verapamil (CALAN-SR) 240 MG CR tablet Take 240 mg by mouth daily. 02/09/17   [provider]  zolpidem (AMBIEN) 5 MG tablet Take 5 mg by mouth at bedtime.    [provider]    Allergies    Nsaids, Diphenhydramine hcl, Flexeril [cyclobenzaprine], Hctz [hydrochlorothiazide], Rexulti [brexpiprazole], Seroquel [quetiapine], Gabapentin, and Hydroxyzine  Review of Systems   Review of Systems Ten systems reviewed and are negative for acute change, except  as noted in the HPI.   Physical Exam Updated Vital Signs BP 126/75   Pulse (!) 54   Temp 98.1 F (36.7 C) (Oral)   Resp 18   Ht 5' 10"  (1.778 m)   Wt 69.4 kg   SpO2 98%   BMI 21.95 kg/m   Physical Exam Vitals and nursing note reviewed.  Constitutional:      General: He is not in acute distress.    Appearance: He is well-developed. He is not diaphoretic.  HENT:     Head: Normocephalic and atraumatic.  Eyes:     General: No scleral icterus.    Conjunctiva/sclera: Conjunctivae normal.  Cardiovascular:     Rate and Rhythm: Normal rate and regular rhythm.     Heart sounds: Normal heart sounds.  Pulmonary:     Effort: Pulmonary effort is normal. No respiratory distress.     Breath sounds: Normal breath sounds.  Abdominal:     Palpations: Abdomen is soft.     Tenderness: There is no abdominal tenderness.  Musculoskeletal:     Cervical back: Normal range of motion and neck supple.  Skin:    General: Skin is warm and dry.  Neurological:     Mental Status: He is alert.  Psychiatric:        Mood and Affect: Mood is anxious.        Thought Content: Thought content includes suicidal ideation.    ED Results / Procedures / Treatments   Labs (all labs ordered are listed, but only abnormal results are displayed) Labs Reviewed  COMPREHENSIVE METABOLIC PANEL - Abnormal; Notable for the following components:      Result Value   Sodium 131 (*)    Chloride 97 (*)    Glucose, Bld 103 (*)    BUN <5 (*)    All other components within normal limits  SALICYLATE LEVEL - Abnormal; Notable for the following components:   Salicylate Lvl <6.2 (*)    All other components within normal limits  ACETAMINOPHEN LEVEL - Abnormal; Notable for the following components:   Acetaminophen (Tylenol), Serum <10 (*)    All other components within normal limits  RAPID URINE DRUG SCREEN, HOSP PERFORMED - Abnormal; Notable  for the following components:   Benzodiazepines POSITIVE (*)     Tetrahydrocannabinol POSITIVE (*)    All other components within normal limits  RESP PANEL BY RT-PCR (FLU A&B, COVID) ARPGX2  ETHANOL  CBC    EKG None  Radiology No results found.  Procedures Procedures   Medications Ordered in ED Medications  LORazepam (ATIVAN) injection 0-4 mg ( Intravenous See Alternative 04/21/21 0736)    Or  LORazepam (ATIVAN) tablet 0-4 mg (1 mg Oral Given 04/21/21 0736)  LORazepam (ATIVAN) injection 0-4 mg (has no administration in time range)    Or  LORazepam (ATIVAN) tablet 0-4 mg (has no administration in time range)  thiamine tablet 100 mg (has no administration in time range)    Or  thiamine (B-1) injection 100 mg (has no administration in time range)    ED Course  I have reviewed the triage vital signs and the nursing notes.  Pertinent labs & imaging results that were available during my care of the patient were reviewed by me and considered in my medical decision making (see chart for details).    MDM Rules/Calculators/A&P  I have reviewed the patient's labs including CMP, CBC, UDS, respiratory panel, ethanol salicylate and Tylenol.  There are no significant abnormalities.  UDS is positive for benzos and THC.  Patient placed on CIWA protocol.  Home meds ordered.  Vital signs have improved significantly.  TTS consult ordered.  He is medically clear.  10:01 AM Patient seen by psychiatry here in the emergency department. Is deemed low risk for completed suicide.  Patient just had his zolpidem filled 10 days ago.  He is advised to follow closely with his primary care physician and take his medications as prescribed.  Patient will be discharged at this time. Final Clinical Impression(s) / ED Diagnoses Final diagnoses:  Suicidal ideation  Withdrawal from other psychoactive substance [facility])    Rx / DC Orders ED Discharge Orders     None         Margarita Mail, PA-C 04/21/21 1002    [name], MD 04/21/21 1047

## 2021-04-21 NOTE — Consult Note (Addendum)
[facility] Psych ED Discharge  04/21/2021 10:17 AM Anthony Lamb  MRN:  [id number]  Method of visit?: Face to Face  Principal Problem: Suicidal ideation Discharge Diagnoses: Principal Problem:   Suicidal ideation   Subjective:  Today, patient is seen and assessed by this nurse practitioner, case discussed with Dr. Dwyane Dee. Patient states " I hadn't slept in 3 days and I was getting frustrated and wanted to commit suicide, but not anymore. I just need sleep. "  Patient currently denies any suicidal ideations, suicidal thoughts, and or denying suicidal self-injurious behavior.  Patient denies homicidal ideations, violent behavior, aggression.  He denies any auditory or visual hallucinations.  He does not appear to be responding to internal stimuli, external stimuli, and or exhibiting delusional thought disorder.  Clinical assessment he does not appear to be experiencing mania, psychosis, paranoia, or otherwise any acute psychiatric symptoms that will warrant further evaluation.  Furthermore patient is able to contract for safety and is requesting discharge home.  Chart review shows patient has recent visit 2 weeks ago with very similar presentation in which he endorsed suicidal ideations, staying overnight and denied suicidal ideations in the morning.  Patient does not appear to have any previous existing suicide attempts and or nonsuicidal self-injurious behaviors.  Patient will be psychiatrically cleared to discharge home with outpatient resources.  HPI: Anthony Lamb is a 65 y.o. male with a past medical history of alcohol misuse disorder, ankylosing spondylitis, hypertension, gastritis, esophagitis, and a recent diagnosis of throat cancer.  Patient states that he has been out of his "sleeping medications" for the past 3 days.  He says that when he does not take his sleeping medication he gets the shakes and he has been drinking more which has worsened to that.  He states that he feels that he does not  want a live anymore.  He is thought about slitting his wrists.  He has a history of suicidal ideation and substance induced mood disorder.    Addendum: patient seen this provider in the hallway and requested extended treatment. " I said I was ready to go but I am not ready to go. I don't have any prescriptions at home. He wont give me any. I filled it on 04/10/2021 and Im out. I think I need further treatment. If I go home Im going to commit suicide then. " Patient was completely dressed out. He is encouraged to contact his PCP or psychiatric provider, as he is clearly misusing his medications. He is reminded that we don't refill controlled substances in the emergency room and strongly discourage patients presenting for this reason.   Further review shows patient has been presenting to the ED for various medical complaints, and displaying drug seeking behaviors as evident by vague medical complaints and request for medications. He has had approximately 17 ED visits for complaints including neck pain, throat pain, headache, chronic pain, vomiting, anxiety, substance abuse, suicidal ideations.  The patient claims he is following up outpatient, but these repeated presentations to the emergency department prove otherwise. Given this extensive history, it appears that inpatient psychiatric hospitalization would not be very beneficial and the patient is coming to the emergency department for some type of secondary gain (ativan and pain medications).    As notes above, the patient's statement that he will attempt suicide if discharged appears to be an expression of unmet needs (pain management) that is representative of limited and often-maladaptive coping and skills, rather than an indicator of imminent risk of  death. The patient is unwilling to participate in any interventions. Patient's treatment goals of pain relief are unobtainable in the short-term inpatient setting. Any safety benefit of hospitalization is  mitigated by patient's lack of collaboration and engagement with the treatment team. Continued treatment in the hospital is delaying patient's engagement with outpatient services and the period during which patient must demonstrate outpatient stability to be eligible for ongoing prescriptions of controlled substances. Continued hospitalization is no longer benefiting the patient and may be unnecessarily delaying effective intervention. Patient is unable to identify social supports for the team to contact to assist in the short-term.  Patient continues to present for concern for malingering. Patient denied active SI yesterday and at the mention of discharge by his primary team he suddenly endorsed active SI w a plan but on reassessment by Psychiatry later in the AM patient denied SI. Patient then recanted his denial of suicidal ideations once he is discharged. He continues to ruminate about not having any prescriptions at home, and he needs to stay longer. Patient reported that his SI is contingent on him being in the hospital and getting his prescriptions. Provider has also noticed that patient's affect appears more positive now and patient speak more on assessment and is more interactive. Patient is determined to be stable for discharge as he appears to be malingering by reporting SI to his primary team which is in charge of his dispo, but denying the same SI w/ psychiatry.   Patient has not benefited from past hospitalizations under similar circumstances in terms of suicide risk modification or improvement in mental health or social conditions. Patient's repeated use of emergency department and inpatient services instead of recommended outpatient follow-up is ineffective in helping improvement. Inpatient hospitalization is not indicated, and the patient is refusing interventions that the clinical care team has offered in the emergency department to mitigate risk of self-harm, other than allowing [place] to observe  the patient to sobriety.   Every intervention has risks and benefits, including hospitalization. When there are no symptoms or risk factors modifiable by an inpatient admission, the potential negative effects of hospitalization (e.g. reinforcing dependency, removing autonomy, behavior contagion, causing trauma by creating a sense of being trapped) outweigh the benefits. While continual observation may reduce risk of harm in the moment, it does not decrease the likelihood of harm in the long term. Acute stabilization is essential to patient safety, but must be carried out in a way will not violate autonomy, justice, and non-maleficence. At the time of discharged it was determined that this patient had maximized the possible benefits of psychiatric observation/consultation. Will psych clear at this time.   Total Time spent with patient: 30 minutes  Past Psychiatric History:   Past Medical History:  Past Medical History:  Diagnosis Date   Alcohol abuse, in remission    Anal fissure    Anemia    Ankylosing spondylitis (HCC)    Anxiety    Arthritis    Chronic diarrhea    Chronic headaches    Chronic pain    Colitis    Colon polyps    Depression    Esophagitis    Gastric polyps    hyperplastic and fundic gland   Gastritis    GERD (gastroesophageal reflux disease)    Hypertension    IBS (irritable bowel syndrome)    Poor dentition    SIADH (syndrome of inappropriate ADH production) ([place])     Past Surgical History:  Procedure Laterality Date   BIOPSY  11/26/2018   Procedure: BIOPSY;  Surgeon: Gatha Mayer, MD;  Location: [facility];  Service: Endoscopy;;   COLONOSCOPY W/ BIOPSIES     COLONOSCOPY WITH PROPOFOL N/A 11/26/2018   Procedure: COLONOSCOPY WITH PROPOFOL;  Surgeon: Gatha Mayer, MD;  Location: [facility];  Service: Endoscopy;  Laterality: N/A;   ESOPHAGOGASTRODUODENOSCOPY     ESOPHAGOGASTRODUODENOSCOPY (EGD) WITH PROPOFOL N/A 11/26/2018   Procedure:  ESOPHAGOGASTRODUODENOSCOPY (EGD) WITH PROPOFOL;  Surgeon: Gatha Mayer, MD;  Location: [facility];  Service: Endoscopy;  Laterality: N/A;   HEMOSTASIS CLIP PLACEMENT  11/26/2018   Procedure: HEMOSTASIS CLIP PLACEMENT;  Surgeon: Gatha Mayer, MD;  Location: [facility];  Service: Endoscopy;;   HERNIA REPAIR Bilateral    HOT HEMOSTASIS N/A 11/26/2018   Procedure: HOT HEMOSTASIS (ARGON PLASMA COAGULATION/BICAP);  Surgeon: Gatha Mayer, MD;  Location: [facility];  Service: Endoscopy;  Laterality: N/A;   OPEN REDUCTION INTERNAL FIXATION (ORIF) DISTAL RADIAL FRACTURE Right 02/11/2018   Procedure: OPEN REDUCTION INTERNAL FIXATION (ORIF) DISTAL RADIAL FRACTURE;  Surgeon: Milly Jakob, MD;  Location: [facility];  Service: Orthopedics;  Laterality: Right;   POLYPECTOMY  11/26/2018   Procedure: POLYPECTOMY;  Surgeon: Gatha Mayer, MD;  Location: [facility];  Service: Endoscopy;;   TONSILLECTOMY     Family History:  Family History  Problem Relation Age of Onset   Anxiety disorder Mother    Congestive Heart Failure Mother    Crohn's disease Mother    Colon cancer Mother 37   Arthritis Father    High blood pressure Father    Crohn's disease Father    Family Psychiatric  History:  Social History:  Social History   Substance and Sexual Activity  Alcohol Use Yes   Comment: 2 25 oz of beer a night     Social History   Substance and Sexual Activity  Drug Use Yes   Types: Marijuana   Comment: other night    Social History   Socioeconomic History   Marital status: Single    Spouse name: Not on file   Number of children: 0   Years of education: Not on file   Highest education level: Not on file  Occupational History   Not on file  Tobacco Use   Smoking status: Former    Types: Cigarettes    Quit date: 2014    Years since quitting: 8.8   Smokeless tobacco: Never  Vaping Use   Vaping Use: Never used  Substance and Sexual Activity   Alcohol use: Yes    Comment: 2 25 oz of  beer a night   Drug use: Yes    Types: Marijuana    Comment: other night   Sexual activity: Not Currently  Other Topics Concern   Not on file  Social History Narrative   HSG. Long - term disability - unable to work. Lived with his mother in her house - she died April 21, 2023 -    Lives in apartment   Has case worker   Social Determinants of Radio broadcast assistant Strain: Not on file  Food Insecurity: Not on file  Transportation Needs: Not on file  Physical Activity: Not on file  Stress: Not on file  Social Connections: Not on file    Tobacco Cessation:  N/A, patient does not currently use tobacco products  Current Medications: Current Facility-Administered Medications  Medication Dose Route Frequency Provider Last Rate Last Admin   acetaminophen (TYLENOL) tablet 500-1,000 mg  500-1,000 mg Oral Q6H  PRN Margarita Mail, PA-C       brexpiprazole (REXULTI) tablet 2 mg  2 mg Oral Daily Harris, Abigail, PA-C       diclofenac Sodium (VOLTAREN) 1 % topical gel 2 g  2 g Topical QID PRN Harris, Abigail, PA-C       famotidine (PEPCID) tablet 20 mg  20 mg Oral BID Harris, Abigail, PA-C   20 mg at 04/21/21 0929   LORazepam (ATIVAN) injection 0-4 mg  0-4 mg Intravenous Q6H Harris, Abigail, PA-C       Or   LORazepam (ATIVAN) tablet 0-4 mg  0-4 mg Oral Q6H Harris, Abigail, PA-C   1 mg at 04/21/21 0736   [START ON 04/23/2021] LORazepam (ATIVAN) injection 0-4 mg  0-4 mg Intravenous Q12H Harris, Abigail, PA-C       Or   [START ON 04/23/2021] LORazepam (ATIVAN) tablet 0-4 mg  0-4 mg Oral Q12H Harris, Abigail, PA-C       meloxicam (MOBIC) tablet 15 mg  15 mg Oral Daily Harris, Abigail, PA-C       methocarbamol (ROBAXIN) tablet 500 mg  500 mg Oral Q8H PRN Margarita Mail, PA-C   500 mg at 04/21/21 0929   pantoprazole (PROTONIX) EC tablet 40 mg  40 mg Oral Daily Margarita Mail, PA-C   40 mg at 04/21/21 5883   thiamine tablet 100 mg  100 mg Oral Daily Margarita Mail, PA-C   100 mg at 04/21/21 2549    Or   thiamine (B-1) injection 100 mg  100 mg Intravenous Daily Harris, Abigail, PA-C       zolpidem (AMBIEN) tablet 5 mg  5 mg Oral QHS Margarita Mail, PA-C       Current Outpatient Medications  Medication Sig Dispense Refill   acetaminophen (TYLENOL) 500 MG tablet Take 500-1,000 mg by mouth every 6 (six) hours as needed (for pain).     clonazePAM (KLONOPIN) 0.5 MG tablet Take 0.5 mg by mouth 3 (three) times daily.     clonazePAM (KLONOPIN) 1 MG tablet Take 1 mg by mouth 3 (three) times daily as needed. (Patient not taking: No sig reported)     diclofenac Sodium (VOLTAREN) 1 % GEL Apply 2 g topically 4 (four) times daily as needed (to painful sites).     DULoxetine (CYMBALTA) 30 MG capsule Take 60 mg by mouth daily. (Patient not taking: Reported on 04/04/2021)     famotidine (PEPCID) 20 MG tablet Take 1 tablet (20 mg total) by mouth 2 (two) times daily. 30 tablet 0   lisinopril (PRINIVIL,ZESTRIL) 10 MG tablet Take 1 tablet (10 mg total) by mouth daily. 30 tablet 0   meloxicam (MOBIC) 15 MG tablet Take 15 mg by mouth daily.     methocarbamol (ROBAXIN) 500 MG tablet Take 500 mg by mouth every 8 (eight) hours as needed for muscle spasms.     Multiple Vitamin (MULTIVITAMIN WITH MINERALS) TABS tablet Take 1 tablet by mouth daily. (May buy over the counter) (Patient not taking: Reported on 04/04/2021) 1 tablet 0   ondansetron (ZOFRAN ODT) 4 MG disintegrating tablet Take 1 tablet (4 mg total) by mouth every 4 (four) hours as needed for nausea or vomiting. (Patient taking differently: Take 4 mg by mouth every 4 (four) hours as needed for nausea or vomiting (DISSOLVE ORALLY).) 20 tablet 0   pantoprazole (PROTONIX) 40 MG tablet Take 1 tablet (40 mg total) by mouth daily. (Patient taking differently: Take 40 mg by mouth in the morning.) 30 tablet 11  propranolol (INDERAL) 10 MG tablet Take 10 mg by mouth 2 (two) times daily.     QUEtiapine (SEROQUEL) 300 MG tablet Take 300 mg by mouth at bedtime.      REXULTI 2 MG TABS tablet Take 2 mg by mouth daily.     sucralfate (CARAFATE) 1 g tablet Take 1 g by mouth 4 (four) times daily.     sucralfate (CARAFATE) 1 GM/10ML suspension Take 10 mLs (1 g total) by mouth 4 (four) times daily -  with meals and at bedtime. 420 mL 0   tiZANidine (ZANAFLEX) 4 MG tablet Take 4 mg by mouth 3 (three) times daily as needed for muscle spasms.     verapamil (CALAN-SR) 240 MG CR tablet Take 240 mg by mouth daily.  2   zolpidem (AMBIEN) 5 MG tablet Take 5 mg by mouth at bedtime.     PTA Medications: (Not in a hospital admission)   Musculoskeletal: Strength & Muscle Tone: within normal limits Gait & Station: normal Patient leans: N/A  Psychiatric Specialty Exam:  Presentation  General Appearance: Casual  Eye Contact:Good  Speech:Clear and Coherent  Speech Volume:Normal  Handedness:Right   Mood and Affect  Mood:Euthymic  Affect:Congruent; Appropriate   Thought Process  Thought Processes:Coherent; Goal Directed  Descriptions of Associations:Intact  Orientation:Full (Time, Place and Person)  Thought Content:Logical  History of Schizophrenia/Schizoaffective disorder:No  Duration of Psychotic Symptoms:No data recorded Hallucinations:No data recorded Ideas of Reference:None  Suicidal Thoughts:No data recorded Homicidal Thoughts:No data recorded  Sensorium  Memory:Immediate Good; Recent Good; Remote Good  Judgment:Good  Insight:Good   Executive Functions  Concentration:Good  Attention Span:Good  [place]  Language:Good   Psychomotor Activity  Psychomotor Activity:No data recorded  Assets  Assets:Communication Skills; Desire for Improvement; Financial Resources/Insurance; Housing; Leisure Time; Resilience   Sleep  Sleep:No data recorded   Physical Exam: Physical Exam Vitals and nursing note reviewed.  Constitutional:      Appearance: Normal appearance. He is normal weight.  HENT:      Head: Normocephalic.  Cardiovascular:     Rate and Rhythm: Normal rate.  Pulmonary:     Effort: Pulmonary effort is normal.  Skin:    General: Skin is warm and dry.  Neurological:     General: No focal deficit present.     Mental Status: He is alert and oriented to person, place, and time. Mental status is at baseline.  Psychiatric:        Attention and Perception: Attention normal. He does not perceive auditory or visual hallucinations.        Mood and Affect: Mood normal.        Speech: Speech normal.        Behavior: Behavior normal. Behavior is cooperative.        Thought Content: Thought content normal. Thought content is not paranoid or delusional. Thought content does not include homicidal or suicidal ideation. Thought content does not include homicidal or suicidal plan.        Judgment: Judgment normal.   Review of Systems  Constitutional:  Negative for chills and fever.  Cardiovascular:  Negative for chest pain.  Gastrointestinal:  Negative for abdominal pain.  Neurological:  Negative for weakness and headaches.  Psychiatric/Behavioral:  Negative for depression, hallucinations, substance abuse and suicidal ideas. The patient is not nervous/anxious and does not have insomnia.   All other systems reviewed and are negative. Blood pressure 126/75, pulse (!) 54, temperature 98.1 F (36.7 C), temperature source Oral,  resp. rate 18, height 5' 10"  (1.778 m), weight 69.4 kg, SpO2 98 %. Body mass index is 21.95 kg/m.   Demographic Factors:  Male, Age 63 or older, and Low socioeconomic status  Loss Factors: Decline in physical health  Historical Factors: NA  Risk Reduction Factors:   Religious beliefs about death  Continued Clinical Symptoms:  Chronic Pain  Cognitive Features That Contribute To Risk:  None    Suicide Risk:  Mild:  Suicidal ideation of limited frequency, intensity, duration, and specificity.  There are no identifiable plans, no associated intent, mild  dysphoria and related symptoms, good self-control (both objective and subjective assessment), few other risk factors, and identifiable protective factors, including available and accessible social support.   Follow-up Information     Sandi Mariscal, MD.   Specialty: Internal Medicine Why: call your doctor to get refills on your medications immediately. ONLY use your medications as prescribed. Contact information: [place] [id number] [phone]                 Plan Of Care/Follow-up recommendations:  Other:  This patient does not meet criteria for inpatient psychiatric treatment. Safe for outpatient treatment with current providers. [employer] aware that this patient would benefit from an ACT Team and will pursue this for him.  Will place [facility] consult for assistance with transportation home.  Disposition: Psychiatrically cleared for discharge home. Patient agrees with this plan. Safety planning reviewed in detail.    Suella Broad, FNP 04/21/2021, 10:17 AM

## 2021-04-21 NOTE — BH Assessment (Addendum)
@  0814, requested nursing Jarrett Soho, RN) to place the TTS machine in patient's room. Nursing stated that they would have to find a room to put the patient in for his TTS assessment.   @0844 , requested nursing to notify TTS when patient is ready to be seen.

## 2021-04-21 NOTE — ED Notes (Signed)
Pt has urinal at bedside for urine specimen

## 2021-04-21 NOTE — ED Triage Notes (Signed)
Patient is complaining of wanting to kill himself. Patient states that he was thinking about cutting his wrist

## 2021-04-21 NOTE — Discharge Instructions (Signed)
If you have any of these thoughts or symptoms, get help right away: Go to your nearest emergency department or crisis center. Call emergency services (911 in the [place] Call or text a suicide crisis helpline.

## 2021-04-21 NOTE — ED Notes (Signed)
Pt given meal tray.

## 2021-04-22 ENCOUNTER — Emergency Department (HOSPITAL_BASED_OUTPATIENT_CLINIC_OR_DEPARTMENT_OTHER)
Admission: EM | Admit: 2021-04-22 | Discharge: 2021-04-22 | Disposition: A | Discharge status: Home / Self Care | Payer: Medicaid Other | Attending: Emergency Medicine | Admitting: Emergency Medicine

## 2021-04-22 ENCOUNTER — Other Ambulatory Visit: Payer: Self-pay

## 2021-04-22 DIAGNOSIS — F419 Anxiety disorder, unspecified: Secondary | ICD-10-CM | POA: Insufficient documentation

## 2021-04-22 DIAGNOSIS — R110 Nausea: Secondary | ICD-10-CM | POA: Insufficient documentation

## 2021-04-22 DIAGNOSIS — Z9114 Patient's other noncompliance with medication regimen: Secondary | ICD-10-CM | POA: Insufficient documentation

## 2021-04-22 DIAGNOSIS — Z79899 Other long term (current) drug therapy: Secondary | ICD-10-CM | POA: Insufficient documentation

## 2021-04-22 DIAGNOSIS — Z87891 Personal history of nicotine dependence: Secondary | ICD-10-CM | POA: Diagnosis not present

## 2021-04-22 DIAGNOSIS — I10 Essential (primary) hypertension: Secondary | ICD-10-CM | POA: Diagnosis not present

## 2021-04-22 LAB — CBC WITH DIFFERENTIAL/PLATELET
Abs Immature Granulocytes: 0.01 10*3/uL (ref 0.00–0.07)
Basophils Absolute: 0.1 10*3/uL (ref 0.0–0.1)
Basophils Relative: 1 %
Eosinophils Absolute: 0 10*3/uL (ref 0.0–0.5)
Eosinophils Relative: 0 %
HCT: 44.6 % (ref 39.0–52.0)
Hemoglobin: 15.3 g/dL (ref 13.0–17.0)
Immature Granulocytes: 0 %
Lymphocytes Relative: 16 %
Lymphs Abs: 1.2 10*3/uL (ref 0.7–4.0)
MCH: 31.5 pg (ref 26.0–34.0)
MCHC: 34.3 g/dL (ref 30.0–36.0)
MCV: 92 fL (ref 80.0–100.0)
Monocytes Absolute: 0.5 10*3/uL (ref 0.1–1.0)
Monocytes Relative: 7 %
Neutro Abs: 5.7 10*3/uL (ref 1.7–7.7)
Neutrophils Relative %: 76 %
Platelets: 272 10*3/uL (ref 150–400)
RBC: 4.85 MIL/uL (ref 4.22–5.81)
RDW: 12.3 % (ref 11.5–15.5)
WBC: 7.6 10*3/uL (ref 4.0–10.5)
nRBC: 0 % (ref 0.0–0.2)

## 2021-04-22 LAB — URINALYSIS, ROUTINE W REFLEX MICROSCOPIC
Bilirubin Urine: NEGATIVE
Glucose, UA: NEGATIVE mg/dL
Hgb urine dipstick: NEGATIVE
Ketones, ur: NEGATIVE mg/dL
Leukocytes,Ua: NEGATIVE
Nitrite: NEGATIVE
Protein, ur: NEGATIVE mg/dL
Specific Gravity, Urine: 1.009 (ref 1.005–1.030)
pH: 7.5 (ref 5.0–8.0)

## 2021-04-22 LAB — COMPREHENSIVE METABOLIC PANEL
ALT: 10 U/L (ref 0–44)
AST: 21 U/L (ref 15–41)
Albumin: 4.9 g/dL (ref 3.5–5.0)
Alkaline Phosphatase: 70 U/L (ref 38–126)
Anion gap: 11 (ref 5–15)
BUN: 5 mg/dL — ABNORMAL LOW (ref 8–23)
CO2: 26 mmol/L (ref 22–32)
Calcium: 9.9 mg/dL (ref 8.9–10.3)
Chloride: 94 mmol/L — ABNORMAL LOW (ref 98–111)
Creatinine, Ser: 0.72 mg/dL (ref 0.61–1.24)
GFR, Estimated: >60 mL/min (ref >60)
Glucose, Bld: 98 mg/dL (ref 70–99)
Potassium: 4.3 mmol/L (ref 3.5–5.1)
Sodium: 131 mmol/L — ABNORMAL LOW (ref 135–145)
Total Bilirubin: 0.8 mg/dL (ref 0.3–1.2)
Total Protein: 7.9 g/dL (ref 6.5–8.1)

## 2021-04-22 LAB — LIPASE, BLOOD: Lipase: 12 U/L (ref 11–51)

## 2021-04-22 MED ORDER — DIAZEPAM 5 MG PO TABS
5.0000 mg | ORAL_TABLET | Freq: Once | ORAL | Status: AC
  Administered 2021-04-22: 5 mg via ORAL
  Filled 2021-04-22: qty 1

## 2021-04-22 MED ORDER — SODIUM CHLORIDE 0.9 % IV BOLUS
1000.0000 mL | Freq: Once | INTRAVENOUS | Status: AC
  Administered 2021-04-22: 1000 mL via INTRAVENOUS

## 2021-04-22 MED ORDER — ACETAMINOPHEN 325 MG PO TABS
650.0000 mg | ORAL_TABLET | Freq: Once | ORAL | Status: AC
  Administered 2021-04-22: 650 mg via ORAL
  Filled 2021-04-22: qty 2

## 2021-04-22 NOTE — ED Provider Notes (Signed)
[facility] EMERGENCY DEPT Provider Note   CSN: [id number] Arrival date & time: 04/22/21  5956     History Chief Complaint  Patient presents with   Generalized Body Aches   Nausea    VEDDER BRITTIAN is a 65 y.o. male.  This is a 65 y.o. male with significant medical history as below, including ETOH abuse, gastritis, HTN, anxiety  who presents to the ED with complaint of anxiety. Pt has a myriad of complaints that are consistent with prior ED visits. Of note he was seen at [facility] with similar complaint, requesting refill on his zolpidem and ativan. Pt was seen by tele-psych at that time and was concern for drug seeking behavior, was discharged in stable condition. Pt to ED today with again a myriad of complaints ranging from sleeplessness, malaise, nausea, poor appetite, pain to feet, anxiety. He is not sure what happened to his lorazepam or zolpidem that was refilled earlier this month. He reports last ETOH use was 3 days ago but he feels as though he is ready to quit at this time because he has run out of money to buy alcohol. He has home health nurse that comes weekly otherwise he lives alone. He has no hallucinations, no acute SI or HI. He has approx 18 recent ED visits with similar complaint. He is concerned that he has cancer, reports he is scheduled for EGD later this month.   The history is provided by the patient. No language interpreter was used.      Past Medical History:  Diagnosis Date   Alcohol abuse, in remission    Anal fissure    Anemia    Ankylosing spondylitis (HCC)    Anxiety    Arthritis    Chronic diarrhea    Chronic headaches    Chronic pain    Colitis    Colon polyps    Depression    Esophagitis    Gastric polyps    hyperplastic and fundic gland   Gastritis    GERD (gastroesophageal reflux disease)    Hypertension    IBS (irritable bowel syndrome)    Poor dentition    SIADH (syndrome of inappropriate ADH production) ([place])      Patient Active Problem List   Diagnosis Date Noted   Suicidal ideation 04/05/2021   MDD (major depressive disorder), recurrent episode, severe ([place]) 03/31/2021   Substance induced mood disorder ([place]) 03/31/2021   Family history of colon cancer 12/02/2018   Gastric polyps    Schatzki's ring    Left sided ulcerative colitis without complication ([place])    Odynophagia 11/25/2018   Dysphagia 11/25/2018   Major depressive disorder, recurrent severe without psychotic features ([place]) 06/24/2018   IBS (irritable bowel syndrome)    Hypertension    Colitis    Adjustment disorder with mixed anxiety and depressed mood 05/11/2017   Alcohol abuse    Anxiety    Gastroesophageal reflux disease    Osteopenia determined by x-ray 08/06/2014   Stress fracture of calcaneus 08/06/2014   Vitamin D deficiency 12/18/2013   Dementia ([place]) 12/18/2013   Benign microscopic hematuria 07/06/2013   SIADH (syndrome of inappropriate ADH production) ([place]) 06/22/2013   Ankylosing spondylitis ([place]) 06/20/2013   Malnutrition of moderate degree ([place]) 06/20/2013   BPH (benign prostatic hyperplasia) 08/22/2010   History of colonic polyps 08/13/2008   Essential hypertension 07/03/2007   PUD (peptic ulcer disease) 07/03/2007    Past Surgical History:  Procedure Laterality Date   BIOPSY  11/26/2018  Procedure: BIOPSY;  Surgeon: Gatha Mayer, MD;  Location: [facility];  Service: Endoscopy;;   COLONOSCOPY W/ BIOPSIES     COLONOSCOPY WITH PROPOFOL N/A 11/26/2018   Procedure: COLONOSCOPY WITH PROPOFOL;  Surgeon: Gatha Mayer, MD;  Location: [facility];  Service: Endoscopy;  Laterality: N/A;   ESOPHAGOGASTRODUODENOSCOPY     ESOPHAGOGASTRODUODENOSCOPY (EGD) WITH PROPOFOL N/A 11/26/2018   Procedure: ESOPHAGOGASTRODUODENOSCOPY (EGD) WITH PROPOFOL;  Surgeon: Gatha Mayer, MD;  Location: [facility];  Service: Endoscopy;  Laterality: N/A;   HEMOSTASIS CLIP PLACEMENT  11/26/2018   Procedure: HEMOSTASIS CLIP  PLACEMENT;  Surgeon: Gatha Mayer, MD;  Location: [facility];  Service: Endoscopy;;   HERNIA REPAIR Bilateral    HOT HEMOSTASIS N/A 11/26/2018   Procedure: HOT HEMOSTASIS (ARGON PLASMA COAGULATION/BICAP);  Surgeon: Gatha Mayer, MD;  Location: [facility];  Service: Endoscopy;  Laterality: N/A;   OPEN REDUCTION INTERNAL FIXATION (ORIF) DISTAL RADIAL FRACTURE Right 02/11/2018   Procedure: OPEN REDUCTION INTERNAL FIXATION (ORIF) DISTAL RADIAL FRACTURE;  Surgeon: Milly Jakob, MD;  Location: [facility];  Service: Orthopedics;  Laterality: Right;   POLYPECTOMY  11/26/2018   Procedure: POLYPECTOMY;  Surgeon: Gatha Mayer, MD;  Location: [facility];  Service: Endoscopy;;   TONSILLECTOMY         Family History  Problem Relation Age of Onset   Anxiety disorder Mother    Congestive Heart Failure Mother    Crohn's disease Mother    Colon cancer Mother 31   Arthritis Father    High blood pressure Father    Crohn's disease Father     Social History   Tobacco Use   Smoking status: Former    Types: Cigarettes    Quit date: 2014    Years since quitting: 8.8   Smokeless tobacco: Never  Vaping Use   Vaping Use: Never used  Substance Use Topics   Alcohol use: Yes    Comment: 2 25 oz of beer a night   Drug use: Yes    Types: Marijuana    Comment: other night    Home Medications Prior to Admission medications   Medication Sig Start Date End Date Taking? Authorizing Provider  acetaminophen (TYLENOL) 500 MG tablet Take 500-1,000 mg by mouth every 6 (six) hours as needed (for pain).    [provider]  clonazePAM (KLONOPIN) 0.5 MG tablet Take 0.5 mg by mouth 3 (three) times daily.    [provider]  clonazePAM (KLONOPIN) 1 MG tablet Take 1 mg by mouth 3 (three) times daily as needed. Patient not taking: No sig reported 02/11/21   [provider]  diclofenac Sodium (VOLTAREN) 1 % GEL Apply 2 g topically 4 (four) times daily as needed (to painful sites).     [provider]  DULoxetine (CYMBALTA) 30 MG capsule Take 60 mg by mouth daily. Patient not taking: Reported on 04/04/2021 03/06/21   [provider]  famotidine (PEPCID) 20 MG tablet Take 1 tablet (20 mg total) by mouth 2 (two) times daily. 04/18/21   Carlisle Cater, PA-C  lisinopril (PRINIVIL,ZESTRIL) 10 MG tablet Take 1 tablet (10 mg total) by mouth daily. 08/21/17 06/04/21  Melanee Spry, MD  meloxicam (MOBIC) 15 MG tablet Take 15 mg by mouth daily.    [provider]  methocarbamol (ROBAXIN) 500 MG tablet Take 500 mg by mouth every 8 (eight) hours as needed for muscle spasms.    [provider]  Multiple Vitamin (MULTIVITAMIN WITH MINERALS) TABS tablet Take 1  tablet by mouth daily. (May buy over the counter) Patient not taking: Reported on 04/04/2021 06/28/18   Connye Burkitt, NP  ondansetron (ZOFRAN ODT) 4 MG disintegrating tablet Take 1 tablet (4 mg total) by mouth every 4 (four) hours as needed for nausea or vomiting. Patient taking differently: Take 4 mg by mouth every 4 (four) hours as needed for nausea or vomiting (DISSOLVE ORALLY). 03/24/21   Charlesetta Shanks, MD  pantoprazole (PROTONIX) 40 MG tablet Take 1 tablet (40 mg total) by mouth daily. Patient taking differently: Take 40 mg by mouth in the morning. 04/24/14   Janith Lima, MD  propranolol (INDERAL) 10 MG tablet Take 10 mg by mouth 2 (two) times daily. 04/05/21   [provider]  QUEtiapine (SEROQUEL) 300 MG tablet Take 300 mg by mouth at bedtime.    [provider]  REXULTI 2 MG TABS tablet Take 2 mg by mouth daily. 04/05/21   [provider]  sucralfate (CARAFATE) 1 g tablet Take 1 g by mouth 4 (four) times daily. 04/05/21   [provider]  sucralfate (CARAFATE) 1 GM/10ML suspension Take 10 mLs (1 g total) by mouth 4 (four) times daily -  with meals and at bedtime. 03/24/21   Charlesetta Shanks, MD  tiZANidine (ZANAFLEX) 4 MG tablet Take 4 mg by mouth 3  (three) times daily as needed for muscle spasms. 03/05/21   [provider]  verapamil (CALAN-SR) 240 MG CR tablet Take 240 mg by mouth daily. 02/09/17   [provider]  zolpidem (AMBIEN) 5 MG tablet Take 5 mg by mouth at bedtime.    [provider]    Allergies    Nsaids, Diphenhydramine hcl, Flexeril [cyclobenzaprine], Hctz [hydrochlorothiazide], Rexulti [brexpiprazole], Seroquel [quetiapine], Gabapentin, and Hydroxyzine  Review of Systems   Review of Systems  Constitutional:  Positive for appetite change and fatigue. Negative for chills and fever.  HENT:  Negative for ear pain and sore throat.   Eyes:  Negative for pain and visual disturbance.  Respiratory:  Negative for cough and shortness of breath.   Cardiovascular:  Negative for chest pain and palpitations.  Gastrointestinal:  Positive for nausea. Negative for abdominal pain and vomiting.  Genitourinary:  Negative for dysuria and hematuria.  Musculoskeletal:  Positive for arthralgias. Negative for back pain.  Skin:  Negative for color change and rash.  Neurological:  Negative for seizures and syncope.  Psychiatric/Behavioral:  Positive for sleep disturbance. The patient is nervous/anxious.   All other systems reviewed and are negative.  Physical Exam Updated Vital Signs BP (!) 152/80 (BP Location: Left Arm)   Pulse 67   Temp 99.5 F (37.5 C) (Oral) Comment: pt eating warm oatmeal  Resp 19   Ht 5' 10"  (1.778 m)   Wt 69.4 kg   SpO2 97%   BMI 21.95 kg/m   Physical Exam Vitals and nursing note reviewed.  Constitutional:      General: He is not in acute distress.    Appearance: Normal appearance. He is well-developed. He is not ill-appearing.  HENT:     Head: Normocephalic and atraumatic.     Right Ear: External ear normal.     Left Ear: External ear normal.     Mouth/Throat:     Mouth: Mucous membranes are moist.  Eyes:     General: No scleral icterus.    Extraocular Movements: Extraocular  movements intact.     Pupils: Pupils are equal, round, and reactive to light.  Cardiovascular:  Rate and Rhythm: Normal rate and regular rhythm.     Pulses: Normal pulses.     Heart sounds: Normal heart sounds.  Pulmonary:     Effort: Pulmonary effort is normal. No respiratory distress.     Breath sounds: Normal breath sounds.  Abdominal:     General: Abdomen is flat. There is no distension.     Palpations: Abdomen is soft.     Tenderness: There is no abdominal tenderness. There is no right CVA tenderness or left CVA tenderness.  Musculoskeletal:        General: Normal range of motion.     Cervical back: Normal range of motion.     Right lower leg: No edema.     Left lower leg: No edema.  Skin:    General: Skin is warm and dry.     Capillary Refill: Capillary refill takes less than 2 seconds.  Neurological:     Mental Status: He is alert and oriented to person, place, and time.     GCS: GCS eye subscore is 4. GCS verbal subscore is 5. GCS motor subscore is 6.     Cranial Nerves: Cranial nerves 2-12 are intact.     Sensory: Sensation is intact.     Motor: Motor function is intact.     Coordination: Coordination is intact.     Gait: Gait is intact. Gait normal.  Psychiatric:        Mood and Affect: Mood normal.        Behavior: Behavior normal.    ED Results / Procedures / Treatments   Labs (all labs ordered are listed, but only abnormal results are displayed) Labs Reviewed  COMPREHENSIVE METABOLIC PANEL - Abnormal; Notable for the following components:      Result Value   Sodium 131 (*)    Chloride 94 (*)    BUN 5 (*)    All other components within normal limits  URINALYSIS, ROUTINE W REFLEX MICROSCOPIC  CBC WITH DIFFERENTIAL/PLATELET  LIPASE, BLOOD    EKG None  Radiology No results found.  Procedures Procedures   Medications Ordered in ED Medications  diazepam (VALIUM) tablet 5 mg (5 mg Oral Given 04/22/21 0902)  sodium chloride 0.9 % bolus 1,000 mL (  Intravenous Stopped 04/22/21 1025)  acetaminophen (TYLENOL) tablet 650 mg (650 mg Oral Given 04/22/21 1106)    ED Course  I have reviewed the triage vital signs and the nursing notes.  Pertinent labs & imaging results that were available during my care of the patient were reviewed by me and considered in my medical decision making (see chart for details).    MDM Rules/Calculators/A&P                          CC: body aches, med refill   This patient complains of above; this involves an extensive number of treatment options and is a complaint that carries with it a high risk of complications and morbidity. Vital signs were reviewed. Serious etiologies considered.  Record review:  Previous records obtained and reviewed   Work up as above, notable for:  Lab results that were available during my care of the patient were reviewed by me and considered in my medical decision making.   Management: Pt given valium  Reassessment:  Pt with multiple ER visits over the last months with very similar complaints. He reports he has difficulty with his anxiety and with sleeplessness. Per PDMP pt with recent  refill on zolpidem and clonazepam, when asked what happened to his medications he reports that he is not sure- he does not know where they are. He admits to drinking ETOH around 3 days ago, he has no plan for suicide or homicide, he does not appear acutely psychotic. He does not appear to be responding to internal stimulus.   Discussed with patient at length regarding responsible use of his ambien/klonopin, he was recently prescribed a 1 month supply around 1.5 wks ago. When I advised the patient that I could not prescribe him more benzo's he reported that the "pharmacy wouldn't fill it anyways." He has multiple ED visits requesting refills for controlled substances. I gave him a one time dose of valium in the ED as he does appear to be anxious but this presentation does appear very similar to prior  presentations to the ED over the last few weeks. I discussed at length that patient should refrain from ETOH use and to take his medications as prescribed. Given outpatient resources for ongoing care to the patient, SW consult was placed to assist the patient with transportation issues to help him get to [employer].  Does not appear to be in acute ETOH withdrawal at this time. No acute psychosis.   The patient improved significantly and was discharged in stable condition. Detailed discussions were had with the patient regarding current findings, and need for close f/u with PCP or on call doctor. The patient has been instructed to return immediately if the symptoms worsen in any way for re-evaluation. Patient verbalized understanding and is in agreement with current care plan. All questions answered prior to discharge.             This chart was dictated using voice recognition software.  Despite best efforts to proofread,  errors can occur which can change the documentation meaning.  Final Clinical Impression(s) / ED Diagnoses Final diagnoses:  Anxiousness  Noncompliance with medications    Rx / DC Orders ED Discharge Orders     None        Jeanell Sparrow, DO 04/22/21 2007

## 2021-04-22 NOTE — ED Notes (Signed)
During pt's vitals being reassessed, pt stated that he "did not want to live anymore". Informed Shawnette - RN.

## 2021-04-22 NOTE — ED Triage Notes (Addendum)
Pt BIB ems from home; c/o generalized pain, sore throat, nausea "for months" ; pt endorses throat and stomach cancer, but has not been diagnosed with either; scheduled for endoscopy at the end of the month; seen at [facility] yesterday for same; states he has lost 30 lbs in 2 months without trying; per ems, pt out of his Ambien and clonazepam, has not been taking as prescribed; VS WNL, ekg unremarkable with ems; pt making statements like "I dont want to live anymore"

## 2021-04-22 NOTE — ED Notes (Signed)
Pt discharged to home. Discharge instructions have been discussed with patient and/or family members. Pt verbally acknowledges understanding d/c instructions, and endorses comprehension to checkout at registration before leaving.  °

## 2021-04-22 NOTE — ED Notes (Signed)
Covid swab not collected.

## 2021-04-22 NOTE — ED Notes (Signed)
Safe travel notified and will be here at 11:30 to p/u   Anthony Lamb

## 2021-04-23 ENCOUNTER — Other Ambulatory Visit: Payer: Self-pay

## 2021-04-23 ENCOUNTER — Emergency Department (HOSPITAL_BASED_OUTPATIENT_CLINIC_OR_DEPARTMENT_OTHER)
Admission: EM | Admit: 2021-04-23 | Discharge: 2021-04-23 | Disposition: A | Discharge status: Home / Self Care | Payer: Medicaid Other | Attending: Emergency Medicine | Admitting: Emergency Medicine

## 2021-04-23 ENCOUNTER — Encounter (HOSPITAL_BASED_OUTPATIENT_CLINIC_OR_DEPARTMENT_OTHER): Payer: Self-pay | Admitting: *Deleted

## 2021-04-23 DIAGNOSIS — Z79899 Other long term (current) drug therapy: Secondary | ICD-10-CM | POA: Insufficient documentation

## 2021-04-23 DIAGNOSIS — R079 Chest pain, unspecified: Secondary | ICD-10-CM | POA: Diagnosis not present

## 2021-04-23 DIAGNOSIS — I10 Essential (primary) hypertension: Secondary | ICD-10-CM | POA: Diagnosis not present

## 2021-04-23 DIAGNOSIS — Z87891 Personal history of nicotine dependence: Secondary | ICD-10-CM | POA: Diagnosis not present

## 2021-04-23 DIAGNOSIS — Z20822 Contact with and (suspected) exposure to covid-19: Secondary | ICD-10-CM | POA: Insufficient documentation

## 2021-04-23 DIAGNOSIS — R001 Bradycardia, unspecified: Secondary | ICD-10-CM | POA: Insufficient documentation

## 2021-04-23 DIAGNOSIS — F419 Anxiety disorder, unspecified: Secondary | ICD-10-CM | POA: Diagnosis not present

## 2021-04-23 DIAGNOSIS — R519 Headache, unspecified: Secondary | ICD-10-CM | POA: Insufficient documentation

## 2021-04-23 DIAGNOSIS — R112 Nausea with vomiting, unspecified: Secondary | ICD-10-CM | POA: Diagnosis not present

## 2021-04-23 LAB — CBC WITH DIFFERENTIAL/PLATELET
Abs Immature Granulocytes: 0.02 10*3/uL (ref 0.00–0.07)
Basophils Absolute: 0 10*3/uL (ref 0.0–0.1)
Basophils Relative: 1 %
Eosinophils Absolute: 0 10*3/uL (ref 0.0–0.5)
Eosinophils Relative: 1 %
HCT: 42.7 % (ref 39.0–52.0)
Hemoglobin: 14.6 g/dL (ref 13.0–17.0)
Immature Granulocytes: 0 %
Lymphocytes Relative: 16 %
Lymphs Abs: 1.3 10*3/uL (ref 0.7–4.0)
MCH: 31.5 pg (ref 26.0–34.0)
MCHC: 34.2 g/dL (ref 30.0–36.0)
MCV: 92 fL (ref 80.0–100.0)
Monocytes Absolute: 0.6 10*3/uL (ref 0.1–1.0)
Monocytes Relative: 7 %
Neutro Abs: 6.3 10*3/uL (ref 1.7–7.7)
Neutrophils Relative %: 75 %
Platelets: 261 10*3/uL (ref 150–400)
RBC: 4.64 MIL/uL (ref 4.22–5.81)
RDW: 12.5 % (ref 11.5–15.5)
WBC: 8.3 10*3/uL (ref 4.0–10.5)
nRBC: 0 % (ref 0.0–0.2)

## 2021-04-23 LAB — COMPREHENSIVE METABOLIC PANEL
ALT: 10 U/L (ref 0–44)
AST: 16 U/L (ref 15–41)
Albumin: 4.5 g/dL (ref 3.5–5.0)
Alkaline Phosphatase: 63 U/L (ref 38–126)
Anion gap: 10 (ref 5–15)
BUN: 5 mg/dL — ABNORMAL LOW (ref 8–23)
CO2: 26 mmol/L (ref 22–32)
Calcium: 9.7 mg/dL (ref 8.9–10.3)
Chloride: 96 mmol/L — ABNORMAL LOW (ref 98–111)
Creatinine, Ser: 0.72 mg/dL (ref 0.61–1.24)
GFR, Estimated: >60 mL/min (ref >60)
Glucose, Bld: 105 mg/dL — ABNORMAL HIGH (ref 70–99)
Potassium: 3.7 mmol/L (ref 3.5–5.1)
Sodium: 132 mmol/L — ABNORMAL LOW (ref 135–145)
Total Bilirubin: 0.8 mg/dL (ref 0.3–1.2)
Total Protein: 7.2 g/dL (ref 6.5–8.1)

## 2021-04-23 LAB — RESP PANEL BY RT-PCR (FLU A&B, COVID) ARPGX2
Influenza A by PCR: NEGATIVE
Influenza B by PCR: NEGATIVE
SARS Coronavirus 2 by RT PCR: NEGATIVE

## 2021-04-23 LAB — TROPONIN I (HIGH SENSITIVITY): Troponin I (High Sensitivity): 3 ng/L (ref <18)

## 2021-04-23 MED ORDER — ACETAMINOPHEN 500 MG PO TABS
1000.0000 mg | ORAL_TABLET | Freq: Once | ORAL | Status: DC
  Filled 2021-04-23: qty 2

## 2021-04-23 MED ORDER — LORAZEPAM 1 MG PO TABS
1.0000 mg | ORAL_TABLET | Freq: Once | ORAL | Status: AC
  Administered 2021-04-23: 1 mg via ORAL
  Filled 2021-04-23: qty 1

## 2021-04-23 NOTE — ED Notes (Signed)
Left via cab with taxi voucher

## 2021-04-23 NOTE — ED Provider Notes (Signed)
[facility] DEPT Provider Note   CSN: [id number] Arrival date & time: 04/23/21  0923     History Chief Complaint  Patient presents with   Back Pain    Anthony Lamb is a 65 y.o. male.  HPI 65 year old male presents with multiple complaints. He is complaining primarily of feeling pain all over.  This has been ongoing for a long time.  He states he feels like he might have a fever and he feels generally unwell.  He complains of nausea and vomiting.  He feels weak and has been losing weight for months.  He also complains of a severe headache.  He tells me he also has chest pain in addition to many other complaints.  Prior to me ending the interview he also tells me that he is anxious and is asking for something for anxiety.  He is prescribed Ativan which he most recently filled at the beginning of this month. Upon further questioning he indicates he started having chest pain in the middle of the night.  Past Medical History:  Diagnosis Date   Alcohol abuse, in remission    Anal fissure    Anemia    Ankylosing spondylitis (HCC)    Anxiety    Arthritis    Chronic diarrhea    Chronic headaches    Chronic pain    Colitis    Colon polyps    Depression    Esophagitis    Gastric polyps    hyperplastic and fundic gland   Gastritis    GERD (gastroesophageal reflux disease)    Hypertension    IBS (irritable bowel syndrome)    Poor dentition    SIADH (syndrome of inappropriate ADH production) ([place])     Patient Active Problem List   Diagnosis Date Noted   Suicidal ideation 04/05/2021   MDD (major depressive disorder), recurrent episode, severe ([place]) 03/31/2021   Substance induced mood disorder ([place]) 03/31/2021   Family history of colon cancer 12/02/2018   Gastric polyps    Schatzki's ring    Left sided ulcerative colitis without complication ([place])    Odynophagia 11/25/2018   Dysphagia 11/25/2018   Major depressive disorder, recurrent severe without  psychotic features ([place]) 06/24/2018   IBS (irritable bowel syndrome)    Hypertension    Colitis    Adjustment disorder with mixed anxiety and depressed mood 05/11/2017   Alcohol abuse    Anxiety    Gastroesophageal reflux disease    Osteopenia determined by x-ray 08/06/2014   Stress fracture of calcaneus 08/06/2014   Vitamin D deficiency 12/18/2013   Dementia ([place]) 12/18/2013   Benign microscopic hematuria 07/06/2013   SIADH (syndrome of inappropriate ADH production) ([place]) 06/22/2013   Ankylosing spondylitis ([place]) 06/20/2013   Malnutrition of moderate degree ([place]) 06/20/2013   BPH (benign prostatic hyperplasia) 08/22/2010   History of colonic polyps 08/13/2008   Essential hypertension 07/03/2007   PUD (peptic ulcer disease) 07/03/2007    Past Surgical History:  Procedure Laterality Date   BIOPSY  11/26/2018   Procedure: BIOPSY;  Surgeon: Gatha Mayer, MD;  Location: [facility];  Service: Endoscopy;;   COLONOSCOPY W/ BIOPSIES     COLONOSCOPY WITH PROPOFOL N/A 11/26/2018   Procedure: COLONOSCOPY WITH PROPOFOL;  Surgeon: Gatha Mayer, MD;  Location: [facility];  Service: Endoscopy;  Laterality: N/A;   ESOPHAGOGASTRODUODENOSCOPY     ESOPHAGOGASTRODUODENOSCOPY (EGD) WITH PROPOFOL N/A 11/26/2018   Procedure: ESOPHAGOGASTRODUODENOSCOPY (EGD) WITH PROPOFOL;  Surgeon: Gatha Mayer, MD;  Location: [facility];  Service: Endoscopy;  Laterality: N/A;   HEMOSTASIS CLIP PLACEMENT  11/26/2018   Procedure: HEMOSTASIS CLIP PLACEMENT;  Surgeon: Gatha Mayer, MD;  Location: [facility];  Service: Endoscopy;;   HERNIA REPAIR Bilateral    HOT HEMOSTASIS N/A 11/26/2018   Procedure: HOT HEMOSTASIS (ARGON PLASMA COAGULATION/BICAP);  Surgeon: Gatha Mayer, MD;  Location: [facility];  Service: Endoscopy;  Laterality: N/A;   OPEN REDUCTION INTERNAL FIXATION (ORIF) DISTAL RADIAL FRACTURE Right 02/11/2018   Procedure: OPEN REDUCTION INTERNAL FIXATION (ORIF) DISTAL RADIAL FRACTURE;  Surgeon:  Milly Jakob, MD;  Location: [facility];  Service: Orthopedics;  Laterality: Right;   POLYPECTOMY  11/26/2018   Procedure: POLYPECTOMY;  Surgeon: Gatha Mayer, MD;  Location: [facility];  Service: Endoscopy;;   TONSILLECTOMY         Family History  Problem Relation Age of Onset   Anxiety disorder Mother    Congestive Heart Failure Mother    Crohn's disease Mother    Colon cancer Mother 37   Arthritis Father    High blood pressure Father    Crohn's disease Father     Social History   Tobacco Use   Smoking status: Former    Types: Cigarettes    Quit date: 2014    Years since quitting: 8.8   Smokeless tobacco: Never  Vaping Use   Vaping Use: Never used  Substance Use Topics   Alcohol use: Yes    Comment: 2 25 oz of beer a night   Drug use: Yes    Types: Marijuana    Comment: other night    Home Medications Prior to Admission medications   Medication Sig Start Date End Date Taking? Authorizing Provider  acetaminophen (TYLENOL) 500 MG tablet Take 500-1,000 mg by mouth every 6 (six) hours as needed (for pain).    [provider]  clonazePAM (KLONOPIN) 0.5 MG tablet Take 0.5 mg by mouth 3 (three) times daily.    [provider]  clonazePAM (KLONOPIN) 1 MG tablet Take 1 mg by mouth 3 (three) times daily as needed. Patient not taking: No sig reported 02/11/21   [provider]  diclofenac Sodium (VOLTAREN) 1 % GEL Apply 2 g topically 4 (four) times daily as needed (to painful sites).    [provider]  DULoxetine (CYMBALTA) 30 MG capsule Take 60 mg by mouth daily. Patient not taking: Reported on 04/04/2021 03/06/21   [provider]  famotidine (PEPCID) 20 MG tablet Take 1 tablet (20 mg total) by mouth 2 (two) times daily. 04/18/21   Carlisle Cater, PA-C  lisinopril (PRINIVIL,ZESTRIL) 10 MG tablet Take 1 tablet (10 mg total) by mouth daily. 08/21/17 06/04/21  Melanee Spry, MD  meloxicam (MOBIC) 15 MG tablet Take 15 mg by mouth  daily.    [provider]  methocarbamol (ROBAXIN) 500 MG tablet Take 500 mg by mouth every 8 (eight) hours as needed for muscle spasms.    [provider]  Multiple Vitamin (MULTIVITAMIN WITH MINERALS) TABS tablet Take 1 tablet by mouth daily. (May buy over the counter) Patient not taking: Reported on 04/04/2021 06/28/18   Connye Burkitt, NP  ondansetron (ZOFRAN ODT) 4 MG disintegrating tablet Take 1 tablet (4 mg total) by mouth every 4 (four) hours as needed for nausea or vomiting. Patient taking differently: Take 4 mg by mouth every 4 (four) hours as needed for nausea or vomiting (DISSOLVE ORALLY). 03/24/21   Charlesetta Shanks, MD  pantoprazole ([place]) 40  MG tablet Take 1 tablet (40 mg total) by mouth daily. Patient taking differently: Take 40 mg by mouth in the morning. 04/24/14   Janith Lima, MD  propranolol (INDERAL) 10 MG tablet Take 10 mg by mouth 2 (two) times daily. 04/05/21   [provider]  QUEtiapine (SEROQUEL) 300 MG tablet Take 300 mg by mouth at bedtime.    [provider]  REXULTI 2 MG TABS tablet Take 2 mg by mouth daily. 04/05/21   [provider]  sucralfate (CARAFATE) 1 g tablet Take 1 g by mouth 4 (four) times daily. 04/05/21   [provider]  sucralfate (CARAFATE) 1 GM/10ML suspension Take 10 mLs (1 g total) by mouth 4 (four) times daily -  with meals and at bedtime. 03/24/21   Charlesetta Shanks, MD  tiZANidine (ZANAFLEX) 4 MG tablet Take 4 mg by mouth 3 (three) times daily as needed for muscle spasms. 03/05/21   [provider]  verapamil (CALAN-SR) 240 MG CR tablet Take 240 mg by mouth daily. 02/09/17   [provider]  zolpidem (AMBIEN) 5 MG tablet Take 5 mg by mouth at bedtime.    [provider]    Allergies    Nsaids, Diphenhydramine hcl, Flexeril [cyclobenzaprine], Hctz [hydrochlorothiazide], Rexulti [brexpiprazole], Seroquel [quetiapine], Gabapentin, and Hydroxyzine  Review of Systems    Review of Systems  Constitutional:  Positive for fever.  HENT:  Positive for sore throat.   Cardiovascular:  Positive for chest pain.  Gastrointestinal:  Positive for vomiting.  Musculoskeletal:  Positive for myalgias.  Neurological:  Positive for headaches.  Psychiatric/Behavioral:  The patient is nervous/anxious.   All other systems reviewed and are negative.  Physical Exam Updated Vital Signs BP 116/65 (BP Location: Right Arm)   Pulse (!) 49   Temp 97.8 F (36.6 C) (Oral)   Resp 18   Ht 5' 10"  (1.778 m)   Wt 69.4 kg   SpO2 99%   BMI 21.95 kg/m   Physical Exam Vitals and nursing note reviewed.  Constitutional:      Appearance: He is well-developed. He is not ill-appearing or diaphoretic.  HENT:     Head: Normocephalic and atraumatic.     Right Ear: External ear normal.     Left Ear: External ear normal.     Nose: Nose normal.     Mouth/Throat:     Pharynx: No oropharyngeal exudate or posterior oropharyngeal erythema.  Eyes:     General:        Right eye: No discharge.        Left eye: No discharge.     Extraocular Movements: Extraocular movements intact.     Pupils: Pupils are equal, round, and reactive to light.  Cardiovascular:     Rate and Rhythm: Regular rhythm. Bradycardia present.     Heart sounds: Normal heart sounds.  Pulmonary:     Effort: Pulmonary effort is normal.     Breath sounds: Normal breath sounds.  Abdominal:     Palpations: Abdomen is soft.     Tenderness: There is no abdominal tenderness.  Musculoskeletal:     Cervical back: Neck supple.  Skin:    General: Skin is warm and dry.  Neurological:     Mental Status: He is alert.     Comments: CN 3-12 grossly intact. 5/5 strength in all 4 extremities. Grossly normal sensation. Normal finger to nose.     ED Results / Procedures / Treatments   Labs (all labs ordered  are listed, but only abnormal results are displayed) Labs Reviewed  COMPREHENSIVE METABOLIC PANEL - Abnormal; Notable for  the following components:      Result Value   Sodium 132 (*)    Chloride 96 (*)    Glucose, Bld 105 (*)    BUN 5 (*)    All other components within normal limits  RESP PANEL BY RT-PCR (FLU A&B, COVID) ARPGX2  CBC WITH DIFFERENTIAL/PLATELET  TROPONIN I (HIGH SENSITIVITY)    EKG EKG Interpretation  Date/Time:  Saturday April 23 2021 07:16:16 EST Ventricular Rate:  48 PR Interval:  180 QRS Duration: 119 QT Interval:  513 QTC Calculation: 459 R Axis:   66 Text Interpretation: Sinus bradycardia Nonspecific intraventricular conduction delay Borderline T abnormalities, inferior leads nonspecific T waves are similar to many priors, including Oct 2022 Confirmed by Sherwood Gambler [phone]) on 04/23/2021 7:21:22 AM  Radiology No results found.  Procedures Procedures   Medications Ordered in ED Medications  acetaminophen (TYLENOL) tablet 1,000 mg (1,000 mg Oral Not Given 04/23/21 0725)  LORazepam (ATIVAN) tablet 1 mg (1 mg Oral Given 04/23/21 3382)    ED Course  I have reviewed the triage vital signs and the nursing notes.  Pertinent labs & imaging results that were available during my care of the patient were reviewed by me and considered in my medical decision making (see chart for details).    MDM Rules/Calculators/A&P                           Patient has multiple complaints.  He has been seen each of the last 2 days for similar.  He did at 1 time mention chest pain in the middle of the night several hours ago.  ECG is nonspecific and while his T wave changes have been present on previous EKGs that were not on the most recent one.  Thus labs and troponin were sent but these are all benign.  My suspicion for ACS is quite low.  At this point, will discharge home for his otherwise chronic symptoms and recommend following up with PCP. Final Clinical Impression(s) / ED Diagnoses Final diagnoses:  Anxiety    Rx / DC Orders ED Discharge Orders     None        Sherwood Gambler, MD 04/23/21 [phone]

## 2021-04-23 NOTE — ED Notes (Signed)
Given crackers and a drink

## 2021-04-23 NOTE — ED Triage Notes (Signed)
Pt bib ems from home, per their report the pt feels generally sick. Told to contact [facility], they are not open on the weekends, he does not know what to do. C/o sore throat, can't eat, feels hot and nauseated. 104/58, hr 61, rr20, 97 ra.

## 2021-04-24 ENCOUNTER — Emergency Department (HOSPITAL_BASED_OUTPATIENT_CLINIC_OR_DEPARTMENT_OTHER)
Admission: EM | Admit: 2021-04-24 | Discharge: 2021-04-24 | Disposition: A | Discharge status: Home / Self Care | Payer: Medicaid Other | Attending: Emergency Medicine | Admitting: Emergency Medicine

## 2021-04-24 ENCOUNTER — Other Ambulatory Visit: Payer: Self-pay

## 2021-04-24 ENCOUNTER — Emergency Department (HOSPITAL_BASED_OUTPATIENT_CLINIC_OR_DEPARTMENT_OTHER): Payer: Medicaid Other

## 2021-04-24 ENCOUNTER — Encounter (HOSPITAL_BASED_OUTPATIENT_CLINIC_OR_DEPARTMENT_OTHER): Payer: Self-pay

## 2021-04-24 DIAGNOSIS — F332 Major depressive disorder, recurrent severe without psychotic features: Secondary | ICD-10-CM | POA: Diagnosis not present

## 2021-04-24 DIAGNOSIS — K219 Gastro-esophageal reflux disease without esophagitis: Secondary | ICD-10-CM | POA: Diagnosis not present

## 2021-04-24 DIAGNOSIS — F1220 Cannabis dependence, uncomplicated: Secondary | ICD-10-CM | POA: Diagnosis not present

## 2021-04-24 DIAGNOSIS — R45851 Suicidal ideations: Secondary | ICD-10-CM | POA: Insufficient documentation

## 2021-04-24 DIAGNOSIS — F32A Depression, unspecified: Secondary | ICD-10-CM | POA: Diagnosis present

## 2021-04-24 DIAGNOSIS — R1031 Right lower quadrant pain: Secondary | ICD-10-CM | POA: Insufficient documentation

## 2021-04-24 DIAGNOSIS — K5900 Constipation, unspecified: Secondary | ICD-10-CM | POA: Diagnosis not present

## 2021-04-24 DIAGNOSIS — R109 Unspecified abdominal pain: Secondary | ICD-10-CM

## 2021-04-24 DIAGNOSIS — F1020 Alcohol dependence, uncomplicated: Secondary | ICD-10-CM | POA: Insufficient documentation

## 2021-04-24 DIAGNOSIS — I10 Essential (primary) hypertension: Secondary | ICD-10-CM | POA: Diagnosis not present

## 2021-04-24 DIAGNOSIS — Z87891 Personal history of nicotine dependence: Secondary | ICD-10-CM | POA: Diagnosis not present

## 2021-04-24 DIAGNOSIS — Z79899 Other long term (current) drug therapy: Secondary | ICD-10-CM | POA: Diagnosis not present

## 2021-04-24 DIAGNOSIS — R300 Dysuria: Secondary | ICD-10-CM | POA: Diagnosis not present

## 2021-04-24 DIAGNOSIS — F0390 Unspecified dementia without behavioral disturbance: Secondary | ICD-10-CM | POA: Diagnosis not present

## 2021-04-24 LAB — COMPREHENSIVE METABOLIC PANEL
ALT: 7 U/L (ref 0–44)
AST: 15 U/L (ref 15–41)
Albumin: 4.5 g/dL (ref 3.5–5.0)
Alkaline Phosphatase: 59 U/L (ref 38–126)
Anion gap: 12 (ref 5–15)
BUN: 6 mg/dL — ABNORMAL LOW (ref 8–23)
CO2: 24 mmol/L (ref 22–32)
Calcium: 9.3 mg/dL (ref 8.9–10.3)
Chloride: 96 mmol/L — ABNORMAL LOW (ref 98–111)
Creatinine, Ser: 0.67 mg/dL (ref 0.61–1.24)
GFR, Estimated: >60 mL/min (ref >60)
Glucose, Bld: 93 mg/dL (ref 70–99)
Potassium: 3.5 mmol/L (ref 3.5–5.1)
Sodium: 132 mmol/L — ABNORMAL LOW (ref 135–145)
Total Bilirubin: 0.7 mg/dL (ref 0.3–1.2)
Total Protein: 7.2 g/dL (ref 6.5–8.1)

## 2021-04-24 LAB — CBC WITH DIFFERENTIAL/PLATELET
Abs Immature Granulocytes: 0.01 10*3/uL (ref 0.00–0.07)
Basophils Absolute: 0 10*3/uL (ref 0.0–0.1)
Basophils Relative: 1 %
Eosinophils Absolute: 0 10*3/uL (ref 0.0–0.5)
Eosinophils Relative: 0 %
HCT: 42.6 % (ref 39.0–52.0)
Hemoglobin: 14.6 g/dL (ref 13.0–17.0)
Immature Granulocytes: 0 %
Lymphocytes Relative: 17 %
Lymphs Abs: 1.4 10*3/uL (ref 0.7–4.0)
MCH: 31.3 pg (ref 26.0–34.0)
MCHC: 34.3 g/dL (ref 30.0–36.0)
MCV: 91.4 fL (ref 80.0–100.0)
Monocytes Absolute: 0.7 10*3/uL (ref 0.1–1.0)
Monocytes Relative: 9 %
Neutro Abs: 6.2 10*3/uL (ref 1.7–7.7)
Neutrophils Relative %: 73 %
Platelets: 241 10*3/uL (ref 150–400)
RBC: 4.66 MIL/uL (ref 4.22–5.81)
RDW: 12.4 % (ref 11.5–15.5)
WBC: 8.4 10*3/uL (ref 4.0–10.5)
nRBC: 0 % (ref 0.0–0.2)

## 2021-04-24 LAB — URINALYSIS, ROUTINE W REFLEX MICROSCOPIC
Bilirubin Urine: NEGATIVE
Glucose, UA: NEGATIVE mg/dL
Hgb urine dipstick: NEGATIVE
Ketones, ur: 15 mg/dL — AB
Leukocytes,Ua: NEGATIVE
Nitrite: NEGATIVE
Specific Gravity, Urine: 1.016 (ref 1.005–1.030)
pH: 7.5 (ref 5.0–8.0)

## 2021-04-24 LAB — RAPID URINE DRUG SCREEN, HOSP PERFORMED
Amphetamines: NOT DETECTED
Barbiturates: NOT DETECTED
Benzodiazepines: POSITIVE — AB
Cocaine: NOT DETECTED
Opiates: NOT DETECTED
Tetrahydrocannabinol: POSITIVE — AB

## 2021-04-24 LAB — ACETAMINOPHEN LEVEL: Acetaminophen (Tylenol), Serum: <10 ug/mL — ABNORMAL LOW (ref 10–30)

## 2021-04-24 LAB — LIPASE, BLOOD: Lipase: 10 U/L — ABNORMAL LOW (ref 11–51)

## 2021-04-24 LAB — SALICYLATE LEVEL: Salicylate Lvl: <7 mg/dL — ABNORMAL LOW (ref 7.0–30.0)

## 2021-04-24 LAB — ETHANOL: Alcohol, Ethyl (B): <10 mg/dL (ref <10)

## 2021-04-24 MED ORDER — SODIUM CHLORIDE 0.9 % IV BOLUS
1000.0000 mL | Freq: Once | INTRAVENOUS | Status: AC
  Administered 2021-04-24: 1000 mL via INTRAVENOUS

## 2021-04-24 MED ORDER — FAMOTIDINE 20 MG PO TABS: 20.0000 mg | ORAL_TABLET | Freq: Two times a day (BID) | ORAL | Status: DC

## 2021-04-24 MED ORDER — LISINOPRIL 10 MG PO TABS
10.0000 mg | ORAL_TABLET | Freq: Every day | ORAL | Status: DC
  Administered 2021-04-24: 10 mg via ORAL
  Filled 2021-04-24: qty 1

## 2021-04-24 MED ORDER — LORAZEPAM 2 MG/ML IJ SOLN
1.0000 mg | Freq: Once | INTRAMUSCULAR | Status: AC
  Administered 2021-04-24: 1 mg via INTRAVENOUS
  Filled 2021-04-24: qty 1

## 2021-04-24 MED ORDER — LORAZEPAM 2 MG/ML IJ SOLN: 0.5000 mg | Freq: Once | INTRAMUSCULAR | Status: DC

## 2021-04-24 MED ORDER — CLONAZEPAM 0.5 MG PO TABS
0.5000 mg | ORAL_TABLET | Freq: Three times a day (TID) | ORAL | Status: DC
  Filled 2021-04-24: qty 1

## 2021-04-24 MED ORDER — LORAZEPAM 1 MG PO TABS: 1.0000 mg | ORAL_TABLET | Freq: Once | ORAL | Status: DC

## 2021-04-24 MED ORDER — CLONAZEPAM 1 MG PO TABS
1.0000 mg | ORAL_TABLET | Freq: Once | ORAL | Status: DC
  Filled 2021-04-24: qty 1

## 2021-04-24 MED ORDER — SUCRALFATE 1 G PO TABS: 1.0000 g | ORAL_TABLET | Freq: Four times a day (QID) | ORAL | Status: DC

## 2021-04-24 MED ORDER — POLYETHYLENE GLYCOL 3350 17 G PO PACK
17.0000 g | PACK | Freq: Every day | ORAL | Status: DC
  Administered 2021-04-24: 17 g via ORAL
  Filled 2021-04-24: qty 1

## 2021-04-24 MED ORDER — METHOCARBAMOL 500 MG PO TABS: 500.0000 mg | ORAL_TABLET | Freq: Three times a day (TID) | ORAL | Status: DC | PRN

## 2021-04-24 MED ORDER — PROPRANOLOL HCL 10 MG PO TABS: 10.0000 mg | ORAL_TABLET | Freq: Two times a day (BID) | ORAL | Status: DC

## 2021-04-24 MED ORDER — VERAPAMIL HCL ER 240 MG PO TBCR: 240.0000 mg | EXTENDED_RELEASE_TABLET | Freq: Every day | ORAL | Status: DC

## 2021-04-24 MED ORDER — ACETAMINOPHEN 500 MG PO TABS
500.0000 mg | ORAL_TABLET | Freq: Four times a day (QID) | ORAL | Status: DC | PRN
Start: 2021-04-24 — End: 2021-04-25
  Administered 2021-04-24: 1000 mg via ORAL
  Filled 2021-04-24: qty 2

## 2021-04-24 MED ORDER — LORAZEPAM 2 MG/ML IJ SOLN
INTRAMUSCULAR | Status: AC
  Filled 2021-04-24: qty 1

## 2021-04-24 MED ORDER — IOHEXOL 300 MG/ML  SOLN
100.0000 mL | Freq: Once | INTRAMUSCULAR | Status: AC | PRN
  Administered 2021-04-24: 100 mL via INTRAVENOUS

## 2021-04-24 MED ORDER — PANTOPRAZOLE SODIUM 40 MG PO TBEC
40.0000 mg | DELAYED_RELEASE_TABLET | Freq: Every day | ORAL | Status: DC
  Administered 2021-04-24: 40 mg via ORAL
  Filled 2021-04-24: qty 1

## 2021-04-24 MED ORDER — ONDANSETRON 4 MG PO TBDP: 4.0000 mg | ORAL_TABLET | ORAL | Status: DC | PRN

## 2021-04-24 MED ORDER — ADULT MULTIVITAMIN W/MINERALS CH
1.0000 | ORAL_TABLET | Freq: Every day | ORAL | Status: DC
  Administered 2021-04-24: 1 via ORAL
  Filled 2021-04-24: qty 1

## 2021-04-24 NOTE — ED Notes (Signed)
Upon his arrival per EMS pt. Gives c/o of "my throat still hurts". The paramedics state pt. Had "normal vital signs" en route to hospital.
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
## 2021-04-24 NOTE — ED Triage Notes (Signed)
Pt arrives to ED Triage room in wheelchair states that he was brought via [facility]. Pt reports hurting all over for moths states 'i got cancer and I am dying' when asked when he was diagnosed with cancer pt reports he has not been but states he knows he has it in his throat and in his rectum. Pt reports pain in rectum when sitting.

## 2021-04-24 NOTE — ED Notes (Signed)
TTS at bedside. 

## 2021-04-24 NOTE — ED Provider Notes (Addendum)
[place] EMERGENCY DEPT Provider Note   CSN: [id number] Arrival date & time: 04/24/21  1011     History Chief Complaint  Patient presents with   Generalized Body Aches   Suicidal    Anthony Lamb is a 65 y.o. male.  HPI   Pt is a 65 y/o male who presents to the ED today for eval of SI. States that he has too many medical problems, he hurts all over and he can't go on any longer. States that he wants to die and has a plan to harm himself by cutting his wrist.   He is c/o right lower abd pain that started last night. Pain is constant. Denies vomiting, diarrhea. Reports he is constipated and cannot remember his last BM. States he has had decreased PO intake due to his anxiety. He reports dysuria.   Past Medical History:  Diagnosis Date   Alcohol abuse, in remission    Anal fissure    Anemia    Ankylosing spondylitis (HCC)    Anxiety    Arthritis    Chronic diarrhea    Chronic headaches    Chronic pain    Colitis    Colon polyps    Depression    Esophagitis    Gastric polyps    hyperplastic and fundic gland   Gastritis    GERD (gastroesophageal reflux disease)    Hypertension    IBS (irritable bowel syndrome)    Poor dentition    SIADH (syndrome of inappropriate ADH production) ([place])     Patient Active Problem List   Diagnosis Date Noted   Suicidal ideation 04/05/2021   MDD (major depressive disorder), recurrent episode, severe ([place]) 03/31/2021   Substance induced mood disorder ([place]) 03/31/2021   Family history of colon cancer 12/02/2018   Gastric polyps    Schatzki's ring    Left sided ulcerative colitis without complication ([place])    Odynophagia 11/25/2018   Dysphagia 11/25/2018   Major depressive disorder, recurrent severe without psychotic features ([place]) 06/24/2018   IBS (irritable bowel syndrome)    Hypertension    Colitis    Adjustment disorder with mixed anxiety and depressed mood 05/11/2017   Alcohol abuse    Anxiety     Gastroesophageal reflux disease    Osteopenia determined by x-ray 08/06/2014   Stress fracture of calcaneus 08/06/2014   Vitamin D deficiency 12/18/2013   Dementia ([place]) 12/18/2013   Benign microscopic hematuria 07/06/2013   SIADH (syndrome of inappropriate ADH production) ([place]) 06/22/2013   Ankylosing spondylitis ([place]) 06/20/2013   Malnutrition of moderate degree ([place]) 06/20/2013   BPH (benign prostatic hyperplasia) 08/22/2010   History of colonic polyps 08/13/2008   Essential hypertension 07/03/2007   PUD (peptic ulcer disease) 07/03/2007    Past Surgical History:  Procedure Laterality Date   BIOPSY  11/26/2018   Procedure: BIOPSY;  Surgeon: Gatha Mayer, MD;  Location: [facility];  Service: Endoscopy;;   COLONOSCOPY W/ BIOPSIES     COLONOSCOPY WITH PROPOFOL N/A 11/26/2018   Procedure: COLONOSCOPY WITH PROPOFOL;  Surgeon: Gatha Mayer, MD;  Location: [facility];  Service: Endoscopy;  Laterality: N/A;   ESOPHAGOGASTRODUODENOSCOPY     ESOPHAGOGASTRODUODENOSCOPY (EGD) WITH PROPOFOL N/A 11/26/2018   Procedure: ESOPHAGOGASTRODUODENOSCOPY (EGD) WITH PROPOFOL;  Surgeon: Gatha Mayer, MD;  Location: [facility];  Service: Endoscopy;  Laterality: N/A;   HEMOSTASIS CLIP PLACEMENT  11/26/2018   Procedure: HEMOSTASIS CLIP PLACEMENT;  Surgeon: Gatha Mayer, MD;  Location: [facility];  Service:  Endoscopy;;   HERNIA REPAIR Bilateral    HOT HEMOSTASIS N/A 11/26/2018   Procedure: HOT HEMOSTASIS (ARGON PLASMA COAGULATION/BICAP);  Surgeon: Gatha Mayer, MD;  Location: [facility];  Service: Endoscopy;  Laterality: N/A;   OPEN REDUCTION INTERNAL FIXATION (ORIF) DISTAL RADIAL FRACTURE Right 02/11/2018   Procedure: OPEN REDUCTION INTERNAL FIXATION (ORIF) DISTAL RADIAL FRACTURE;  Surgeon: Milly Jakob, MD;  Location: [facility];  Service: Orthopedics;  Laterality: Right;   POLYPECTOMY  11/26/2018   Procedure: POLYPECTOMY;  Surgeon: Gatha Mayer, MD;  Location: [facility];  Service:  Endoscopy;;   TONSILLECTOMY         Family History  Problem Relation Age of Onset   Anxiety disorder Mother    Congestive Heart Failure Mother    Crohn's disease Mother    Colon cancer Mother 48   Arthritis Father    High blood pressure Father    Crohn's disease Father     Social History   Tobacco Use   Smoking status: Former    Types: Cigarettes    Quit date: 2014    Years since quitting: 8.8   Smokeless tobacco: Never  Vaping Use   Vaping Use: Never used  Substance Use Topics   Alcohol use: Yes    Comment: 2 25 oz of beer a night   Drug use: Yes    Types: Marijuana    Comment: other night    Home Medications Prior to Admission medications   Medication Sig Start Date End Date Taking? Authorizing Provider  acetaminophen (TYLENOL) 500 MG tablet Take 500-1,000 mg by mouth every 6 (six) hours as needed (for pain).    [provider]  clonazePAM (KLONOPIN) 0.5 MG tablet Take 0.5 mg by mouth 3 (three) times daily.    [provider]  clonazePAM (KLONOPIN) 1 MG tablet Take 1 mg by mouth 3 (three) times daily as needed. Patient not taking: No sig reported 02/11/21   [provider]  diclofenac Sodium (VOLTAREN) 1 % GEL Apply 2 g topically 4 (four) times daily as needed (to painful sites).    [provider]  DULoxetine (CYMBALTA) 30 MG capsule Take 60 mg by mouth daily. Patient not taking: Reported on 04/04/2021 03/06/21   [provider]  famotidine (PEPCID) 20 MG tablet Take 1 tablet (20 mg total) by mouth 2 (two) times daily. 04/18/21   Carlisle Cater, PA-C  lisinopril (PRINIVIL,ZESTRIL) 10 MG tablet Take 1 tablet (10 mg total) by mouth daily. 08/21/17 06/04/21  Melanee Spry, MD  meloxicam (MOBIC) 15 MG tablet Take 15 mg by mouth daily.    [provider]  methocarbamol (ROBAXIN) 500 MG tablet Take 500 mg by mouth every 8 (eight) hours as needed for muscle spasms.    [provider]  Multiple Vitamin  (MULTIVITAMIN WITH MINERALS) TABS tablet Take 1 tablet by mouth daily. (May buy over the counter) Patient not taking: Reported on 04/04/2021 06/28/18   Connye Burkitt, NP  ondansetron (ZOFRAN ODT) 4 MG disintegrating tablet Take 1 tablet (4 mg total) by mouth every 4 (four) hours as needed for nausea or vomiting. Patient taking differently: Take 4 mg by mouth every 4 (four) hours as needed for nausea or vomiting (DISSOLVE ORALLY). 03/24/21   Charlesetta Shanks, MD  pantoprazole (PROTONIX) 40 MG tablet Take 1 tablet (40 mg total) by mouth daily. Patient taking differently: Take 40 mg by mouth in the morning. 04/24/14   Janith Lima, MD  propranolol (INDERAL) 10 MG  tablet Take 10 mg by mouth 2 (two) times daily. 04/05/21   [provider]  QUEtiapine (SEROQUEL) 300 MG tablet Take 300 mg by mouth at bedtime.    [provider]  REXULTI 2 MG TABS tablet Take 2 mg by mouth daily. 04/05/21   [provider]  sucralfate (CARAFATE) 1 g tablet Take 1 g by mouth 4 (four) times daily. 04/05/21   [provider]  sucralfate (CARAFATE) 1 GM/10ML suspension Take 10 mLs (1 g total) by mouth 4 (four) times daily -  with meals and at bedtime. 03/24/21   Charlesetta Shanks, MD  tiZANidine (ZANAFLEX) 4 MG tablet Take 4 mg by mouth 3 (three) times daily as needed for muscle spasms. 03/05/21   [provider]  verapamil (CALAN-SR) 240 MG CR tablet Take 240 mg by mouth daily. 02/09/17   [provider]  zolpidem (AMBIEN) 5 MG tablet Take 5 mg by mouth at bedtime.    [provider]    Allergies    Nsaids, Diphenhydramine hcl, Flexeril [cyclobenzaprine], Hctz [hydrochlorothiazide], Rexulti [brexpiprazole], Seroquel [quetiapine], Gabapentin, and Hydroxyzine  Review of Systems   Review of Systems  Constitutional:  Negative for fever.  HENT:  Positive for congestion. Negative for ear pain and sore throat.   Eyes:  Negative for pain and visual disturbance.   Respiratory:  Negative for cough and shortness of breath.   Cardiovascular:  Negative for chest pain.  Gastrointestinal:  Positive for abdominal pain and constipation. Negative for diarrhea, nausea and vomiting.  Genitourinary:  Negative for dysuria and hematuria.  Musculoskeletal:  Negative for back pain.  Skin:  Negative for color change and rash.  Neurological:  Negative for headaches.  All other systems reviewed and are negative.  Physical Exam Updated Vital Signs BP (!) 154/75 (BP Location: Right Arm)   Pulse 78   Temp 98.6 F (37 C)   Resp 18   Ht 5' 10"  (1.778 m)   Wt 68 kg   SpO2 100%   BMI 21.52 kg/m   Physical Exam Vitals and nursing note reviewed.  Constitutional:      General: He is not in acute distress.    Appearance: He is well-developed.  HENT:     Head: Normocephalic and atraumatic.  Eyes:     Conjunctiva/sclera: Conjunctivae normal.  Cardiovascular:     Rate and Rhythm: Normal rate and regular rhythm.     Heart sounds: Normal heart sounds. No murmur heard. Pulmonary:     Effort: Pulmonary effort is normal. No respiratory distress.     Breath sounds: Normal breath sounds.  Abdominal:     General: Bowel sounds are normal.     Palpations: Abdomen is soft.     Tenderness: There is no abdominal tenderness. There is no rebound.     Comments: TTP to the right inguinal area. No hernia noted  Musculoskeletal:        General: No swelling.     Cervical back: Neck supple.  Skin:    General: Skin is warm and dry.     Capillary Refill: Capillary refill takes less than 2 seconds.  Neurological:     Mental Status: He is alert.  Psychiatric:        Mood and Affect: Mood normal.    ED Results / Procedures / Treatments   Labs (all labs ordered are listed, but only abnormal results are displayed) Labs Reviewed  COMPREHENSIVE METABOLIC PANEL - Abnormal; Notable for the following components:  Result Value   Sodium 132 (*)    Chloride 96 (*)    BUN 6 (*)     All other components within normal limits  LIPASE, BLOOD - Abnormal; Notable for the following components:   Lipase 10 (*)    All other components within normal limits  URINALYSIS, ROUTINE W REFLEX MICROSCOPIC - Abnormal; Notable for the following components:   Ketones, ur 15 (*)    Protein, ur TRACE (*)    All other components within normal limits  SALICYLATE LEVEL - Abnormal; Notable for the following components:   Salicylate Lvl <7.3 (*)    All other components within normal limits  ACETAMINOPHEN LEVEL - Abnormal; Notable for the following components:   Acetaminophen (Tylenol), Serum <10 (*)    All other components within normal limits  RAPID URINE DRUG SCREEN, HOSP PERFORMED - Abnormal; Notable for the following components:   Benzodiazepines POSITIVE (*)    Tetrahydrocannabinol POSITIVE (*)    All other components within normal limits  CBC WITH DIFFERENTIAL/PLATELET  ETHANOL    EKG None  Radiology CT ABDOMEN PELVIS W CONTRAST  Result Date: 04/24/2021 CLINICAL DATA:  Pain right lower quadrant EXAM: CT ABDOMEN AND PELVIS WITH CONTRAST TECHNIQUE: Multidetector CT imaging of the abdomen and pelvis was performed using the standard protocol following bolus administration of intravenous contrast. CONTRAST:  147m OMNIPAQUE IOHEXOL 300 MG/ML  SOLN COMPARISON:  03/30/2021 FINDINGS: Lower chest: Unremarkable. Hepatobiliary: No focal abnormality is seen in the liver. Gallbladder is distended. There is no wall thickening. There is no dilation of bile ducts. Pancreas: There is atrophy.  No focal abnormality is seen. Spleen: Unremarkable. Adrenals/Urinary Tract: There is hyperplasia of left adrenal with no significant interval change. There is no hydronephrosis. There are no renal or ureteral stones. Stomach/Bowel: Stomach is not distended. Small bowel loops are not dilated. Appendix is seen in the right pelvic cavity. There is no dilation of appendix. There is no significant wall thickening  in colon. Scattered diverticula are seen in the sigmoid. There is no evidence of acute diverticulitis. Vascular/Lymphatic: Significant atherosclerotic plaques and calcifications are seen in the aorta and its major branches. Reproductive: There are coarse calcifications in the prostate. Other: There is no ascites or pneumoperitoneum. Musculoskeletal: Extensive calcifications are seen in the anterior spinal ligament. There is partial fusion of SI joints. IMPRESSION: There is no evidence of intestinal obstruction or pneumoperitoneum. Appendix is not dilated. There is no hydronephrosis. Diverticulosis of colon without signs of focal diverticulitis. Possible ankylosing spondylitis. Diffuse atherosclerotic changes are noted. Other findings as described in the body of the report. Electronically Signed   By: PElmer PickerM.D.   On: 04/24/2021 15:20    Procedures Procedures   Medications Ordered in ED Medications  polyethylene glycol (MIRALAX / GLYCOLAX) packet 17 g (17 g Oral Given 04/24/21 1548)  clonazePAM (KLONOPIN) tablet 0.5 mg (has no administration in time range)  acetaminophen (TYLENOL) tablet 500-1,000 mg (has no administration in time range)  famotidine (PEPCID) tablet 20 mg (has no administration in time range)  lisinopril (ZESTRIL) tablet 10 mg (has no administration in time range)  methocarbamol (ROBAXIN) tablet 500 mg (has no administration in time range)  multivitamin with minerals tablet 1 tablet (has no administration in time range)  ondansetron (ZOFRAN-ODT) disintegrating tablet 4 mg (has no administration in time range)  pantoprazole (PROTONIX) EC tablet 40 mg (has no administration in time range)  propranolol (INDERAL) tablet 10 mg (has no administration in time range)  sucralfate (CARAFATE)  tablet 1 g (has no administration in time range)  verapamil (CALAN-SR) CR tablet 240 mg (has no administration in time range)  sodium chloride 0.9 % bolus 1,000 mL (0 mLs Intravenous Stopped  04/24/21 1503)  LORazepam (ATIVAN) injection 1 mg (1 mg Intravenous Given 04/24/21 1315)  iohexol (OMNIPAQUE) 300 MG/ML solution 100 mL (100 mLs Intravenous Contrast Given 04/24/21 1431)  LORazepam (ATIVAN) injection 1 mg (1 mg Intravenous Given 04/24/21 1501)    ED Course  I have reviewed the triage vital signs and the nursing notes.  Pertinent labs & imaging results that were available during my care of the patient were reviewed by me and considered in my medical decision making (see chart for details).    MDM Rules/Calculators/A&P                          65 y/o presenting for eval of si and abd pain   Labs reassuring. Ct abd/pelvis nonconcerning. This seems more chronic in nature and I doubt emergent cause of abd pain. He is medically cleared for tts eval  Home meds have been ordered.   He is currently awaiting TTS eval.   The patient has been placed in psychiatric observation due to the need to provide a safe environment for the patient while obtaining psychiatric consultation and evaluation, as well as ongoing medical and medication management to treat the patient's condition.  The patient has not been placed under full IVC at this time.  Update received from tts. Pt cleared from psychiatric standpoint and recommended for outpatient f/u. Pt has reported to staff multiple times since his arrival that he is no longer suicidal. I do feel he is appropriate for discharge.   Final Clinical Impression(s) / ED Diagnoses Final diagnoses:  Suicidal ideation  Abdominal pain, unspecified abdominal location    Rx / DC Orders ED Discharge Orders     None        Rodney Booze, PA-C 04/24/21 1656    Tanaysha Alkins S, PA-C 04/24/21 2036    Gareth Morgan, MD 04/25/21 0004

## 2021-04-24 NOTE — BH Assessment (Signed)
Oneida Alar, NP determined Pt is psychiatrically cleared for discharge. Recommendation is outpatient treatment with current providers, [facility]. Notified Dr Lennice Sites and Ranette Rodden, RN via secure message.   Evelena Peat, [facility], [facility] Triage Specialist [phone]

## 2021-04-24 NOTE — Discharge Instructions (Signed)
Please follow up with your primary care provider within 5-7 days for re-evaluation of your symptoms. If you do not have a primary care provider, information for a healthcare clinic has been provided for you to make arrangements for follow up care. Please return to the emergency department for any new or worsening symptoms. ° °

## 2021-04-24 NOTE — ED Notes (Signed)
Pt states he is ready to go home.  Explained to pt he will be re-evaluated in the AM.  States he has not slept in over a week because of alcohol withdraw and out of "nerve medicine".  Pt ate 30% of meal and is requesting nerve medicine and Seroquel

## 2021-04-26 ENCOUNTER — Emergency Department (HOSPITAL_COMMUNITY)
Admission: EM | Admit: 2021-04-26 | Discharge: 2021-04-26 | Disposition: A | Discharge status: Home / Self Care | Payer: Medicaid Other | Attending: Emergency Medicine | Admitting: Emergency Medicine

## 2021-04-26 DIAGNOSIS — Z87891 Personal history of nicotine dependence: Secondary | ICD-10-CM | POA: Diagnosis not present

## 2021-04-26 DIAGNOSIS — Z79899 Other long term (current) drug therapy: Secondary | ICD-10-CM | POA: Diagnosis not present

## 2021-04-26 DIAGNOSIS — R45851 Suicidal ideations: Secondary | ICD-10-CM | POA: Insufficient documentation

## 2021-04-26 DIAGNOSIS — F0390 Unspecified dementia without behavioral disturbance: Secondary | ICD-10-CM | POA: Insufficient documentation

## 2021-04-26 DIAGNOSIS — I10 Essential (primary) hypertension: Secondary | ICD-10-CM | POA: Diagnosis not present

## 2021-04-26 DIAGNOSIS — F1320 Sedative, hypnotic or anxiolytic dependence, uncomplicated: Secondary | ICD-10-CM | POA: Insufficient documentation

## 2021-04-26 DIAGNOSIS — F1020 Alcohol dependence, uncomplicated: Secondary | ICD-10-CM | POA: Insufficient documentation

## 2021-04-26 DIAGNOSIS — Z20822 Contact with and (suspected) exposure to covid-19: Secondary | ICD-10-CM | POA: Diagnosis not present

## 2021-04-26 DIAGNOSIS — F419 Anxiety disorder, unspecified: Secondary | ICD-10-CM | POA: Insufficient documentation

## 2021-04-26 DIAGNOSIS — L298 Other pruritus: Secondary | ICD-10-CM | POA: Insufficient documentation

## 2021-04-26 DIAGNOSIS — F332 Major depressive disorder, recurrent severe without psychotic features: Secondary | ICD-10-CM | POA: Diagnosis present

## 2021-04-26 DIAGNOSIS — F1310 Sedative, hypnotic or anxiolytic abuse, uncomplicated: Secondary | ICD-10-CM

## 2021-04-26 LAB — COMPREHENSIVE METABOLIC PANEL
ALT: 14 U/L (ref 0–44)
AST: 22 U/L (ref 15–41)
Albumin: 4.5 g/dL (ref 3.5–5.0)
Alkaline Phosphatase: 72 U/L (ref 38–126)
Anion gap: 10 (ref 5–15)
BUN: 6 mg/dL — ABNORMAL LOW (ref 8–23)
CO2: 25 mmol/L (ref 22–32)
Calcium: 8.9 mg/dL (ref 8.9–10.3)
Chloride: 96 mmol/L — ABNORMAL LOW (ref 98–111)
Creatinine, Ser: 0.64 mg/dL (ref 0.61–1.24)
GFR, Estimated: >60 mL/min (ref >60)
Glucose, Bld: 100 mg/dL — ABNORMAL HIGH (ref 70–99)
Potassium: 3.7 mmol/L (ref 3.5–5.1)
Sodium: 131 mmol/L — ABNORMAL LOW (ref 135–145)
Total Bilirubin: 0.7 mg/dL (ref 0.3–1.2)
Total Protein: 7.6 g/dL (ref 6.5–8.1)

## 2021-04-26 LAB — CBC
HCT: 43.1 % (ref 39.0–52.0)
Hemoglobin: 14.9 g/dL (ref 13.0–17.0)
MCH: 31.8 pg (ref 26.0–34.0)
MCHC: 34.6 g/dL (ref 30.0–36.0)
MCV: 91.9 fL (ref 80.0–100.0)
Platelets: 282 10*3/uL (ref 150–400)
RBC: 4.69 MIL/uL (ref 4.22–5.81)
RDW: 12.5 % (ref 11.5–15.5)
WBC: 7.8 10*3/uL (ref 4.0–10.5)
nRBC: 0 % (ref 0.0–0.2)

## 2021-04-26 LAB — RESP PANEL BY RT-PCR (FLU A&B, COVID) ARPGX2
Influenza A by PCR: NEGATIVE
Influenza B by PCR: NEGATIVE
SARS Coronavirus 2 by RT PCR: NEGATIVE

## 2021-04-26 LAB — RAPID URINE DRUG SCREEN, HOSP PERFORMED
Amphetamines: NOT DETECTED
Barbiturates: NOT DETECTED
Benzodiazepines: POSITIVE — AB
Cocaine: NOT DETECTED
Opiates: NOT DETECTED
Tetrahydrocannabinol: POSITIVE — AB

## 2021-04-26 LAB — URINALYSIS, ROUTINE W REFLEX MICROSCOPIC
Bilirubin Urine: NEGATIVE
Glucose, UA: 50 mg/dL — AB
Hgb urine dipstick: NEGATIVE
Ketones, ur: NEGATIVE mg/dL
Leukocytes,Ua: NEGATIVE
Nitrite: NEGATIVE
Protein, ur: NEGATIVE mg/dL
Specific Gravity, Urine: 1.01 (ref 1.005–1.030)
pH: 7 (ref 5.0–8.0)

## 2021-04-26 LAB — ACETAMINOPHEN LEVEL: Acetaminophen (Tylenol), Serum: <10 ug/mL — ABNORMAL LOW (ref 10–30)

## 2021-04-26 LAB — ETHANOL: Alcohol, Ethyl (B): <10 mg/dL (ref <10)

## 2021-04-26 LAB — SALICYLATE LEVEL: Salicylate Lvl: <7 mg/dL — ABNORMAL LOW (ref 7.0–30.0)

## 2021-04-26 MED ORDER — PROPRANOLOL HCL 20 MG PO TABS: 10.0000 mg | ORAL_TABLET | Freq: Two times a day (BID) | ORAL | Status: DC

## 2021-04-26 MED ORDER — LISINOPRIL 10 MG PO TABS: 10.0000 mg | ORAL_TABLET | Freq: Every day | ORAL | Status: DC

## 2021-04-26 MED ORDER — VERAPAMIL HCL ER 240 MG PO TBCR: 240.0000 mg | EXTENDED_RELEASE_TABLET | Freq: Every day | ORAL | Status: DC

## 2021-04-26 MED ORDER — SUCRALFATE 1 G PO TABS
1.0000 g | ORAL_TABLET | Freq: Four times a day (QID) | ORAL | Status: DC
  Filled 2021-04-26: qty 1

## 2021-04-26 MED ORDER — DICLOFENAC SODIUM 1 % EX GEL: 2.0000 g | Freq: Four times a day (QID) | CUTANEOUS | Status: DC | PRN

## 2021-04-26 MED ORDER — LORAZEPAM 1 MG PO TABS
1.0000 mg | ORAL_TABLET | Freq: Once | ORAL | Status: AC
  Administered 2021-04-26: 1 mg via ORAL
  Filled 2021-04-26: qty 1

## 2021-04-26 MED ORDER — PROPRANOLOL HCL 20 MG PO TABS
10.0000 mg | ORAL_TABLET | Freq: Two times a day (BID) | ORAL | Status: DC
  Filled 2021-04-26: qty 1

## 2021-04-26 MED ORDER — CLONAZEPAM 0.5 MG PO TABS
0.5000 mg | ORAL_TABLET | Freq: Three times a day (TID) | ORAL | Status: DC
  Filled 2021-04-26: qty 1

## 2021-04-26 MED ORDER — ACETAMINOPHEN 500 MG PO TABS
500.0000 mg | ORAL_TABLET | Freq: Four times a day (QID) | ORAL | Status: DC | PRN
Start: 2021-04-26 — End: 2021-04-26

## 2021-04-26 MED ORDER — FAMOTIDINE 20 MG PO TABS
20.0000 mg | ORAL_TABLET | Freq: Two times a day (BID) | ORAL | Status: DC
  Filled 2021-04-26: qty 1

## 2021-04-26 MED ORDER — METHOCARBAMOL 500 MG PO TABS
500.0000 mg | ORAL_TABLET | Freq: Three times a day (TID) | ORAL | Status: DC | PRN
  Filled 2021-04-26: qty 1

## 2021-04-26 MED ORDER — ONDANSETRON 4 MG PO TBDP: 4.0000 mg | ORAL_TABLET | ORAL | Status: DC | PRN

## 2021-04-26 MED ORDER — LISINOPRIL 10 MG PO TABS
10.0000 mg | ORAL_TABLET | Freq: Every day | ORAL | Status: DC
  Filled 2021-04-26: qty 1

## 2021-04-26 MED ORDER — ZOLPIDEM TARTRATE 5 MG PO TABS: 5.0000 mg | ORAL_TABLET | Freq: Every day | ORAL | Status: DC

## 2021-04-26 NOTE — ED Triage Notes (Signed)
BIB EMS from home, complains of SI by 'cutting wrist, overdose, or something' per patient.  Stopped taking his psych meds as well. Also reports penile itching/burning.
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
## 2021-04-26 NOTE — Progress Notes (Signed)
.  Transition of Care [facility]) - Emergency Department Mini Assessment   Patient Details  Name: Anthony Lamb MRN: [id number] Date of Birth: July 19, 1955  Transition of Care [facility]) CM/SW Contact:    Illene Regulus, LCSW Phone Number: 04/26/2021, 2:32 PM   Clinical Narrative: TOC CSW was consulted in regards to placement. CSW spoke with pt, he stated he ran out of medications. Pt stated he has trouble with sleeping so he takes more than prescribed. Pt stated concerns with not be able to eat due to "cancer in his throat". Pt stated he still has his apartment  and is in the ED due to SI and "medical concerns". Pt is currently waiting on Psych eval. Pt is not appropriate for SNF placement as he has no rehab need. TOC CSW sign off    ED Mini Assessment: What brought you to the Emergency Department? : SI  Barriers to Discharge: ED Psych evaluation        Interventions which prevented an admission or readmission: Homeless Screening, Medication Review, Patient counseling    Patient Contact and Communications        ,                 Admission diagnosis:  si Patient Active Problem List   Diagnosis Date Noted   Suicidal ideation 04/05/2021   MDD (major depressive disorder), recurrent episode, severe ([place]) 03/31/2021   Substance induced mood disorder ([place]) 03/31/2021   Family history of colon cancer 12/02/2018   Gastric polyps    Schatzki's ring    Left sided ulcerative colitis without complication ([place])    Odynophagia 11/25/2018   Dysphagia 11/25/2018   Major depressive disorder, recurrent severe without psychotic features ([place]) 06/24/2018   IBS (irritable bowel syndrome)    Hypertension    Colitis    Adjustment disorder with mixed anxiety and depressed mood 05/11/2017   Alcohol abuse    Anxiety    Gastroesophageal reflux disease    Osteopenia determined by x-ray 08/06/2014   Stress fracture of calcaneus 08/06/2014   Vitamin D deficiency 12/18/2013    Dementia ([place]) 12/18/2013   Benign microscopic hematuria 07/06/2013   SIADH (syndrome of inappropriate ADH production) ([place]) 06/22/2013   Ankylosing spondylitis ([place]) 06/20/2013   Malnutrition of moderate degree ([place]) 06/20/2013   BPH (benign prostatic hyperplasia) 08/22/2010   History of colonic polyps 08/13/2008   Essential hypertension 07/03/2007   PUD (peptic ulcer disease) 07/03/2007   PCP:  Sandi Mariscal, MD Pharmacy:   [facility] [place], [place] - [address] AT [address] [place] [address] Phone: [phone] Fax: [phone]  [facility] [place], [place] [address] AT [place] [address] Phone: [phone] Fax: [place], [place] [address] Phone: [phone] Fax: [phone]

## 2021-04-26 NOTE — ED Provider Notes (Addendum)
[facility] DEPT Provider Note   CSN: [id number] Arrival date & time: 04/26/21  1042     History Chief Complaint  Patient presents with   Suicidal   genital itching     Anthony Lamb is a 65 y.o. male.  Pt presents to the ED today with SI.  He is well known to the ED and has been here frequently for similar complaints.  He was last seen at [facility] on 11/19.  Pt said he is out of all his meds.  He feels very anxious and wants to kill himself by taking an overdose or by slitting his wrists.  Pt said he can't sleep.  Pt also c/o pain when he urinates.      Past Medical History:  Diagnosis Date   Alcohol abuse, in remission    Anal fissure    Anemia    Ankylosing spondylitis (HCC)    Anxiety    Arthritis    Chronic diarrhea    Chronic headaches    Chronic pain    Colitis    Colon polyps    Depression    Esophagitis    Gastric polyps    hyperplastic and fundic gland   Gastritis    GERD (gastroesophageal reflux disease)    Hypertension    IBS (irritable bowel syndrome)    Poor dentition    SIADH (syndrome of inappropriate ADH production) ([facility])     Patient Active Problem List   Diagnosis Date Noted   Suicidal ideation 04/05/2021   MDD (major depressive disorder), recurrent episode, severe ([place]) 03/31/2021   Substance induced mood disorder ([place]) 03/31/2021   Family history of colon cancer 12/02/2018   Gastric polyps    Schatzki's ring    Left sided ulcerative colitis without complication ([place])    Odynophagia 11/25/2018   Dysphagia 11/25/2018   Major depressive disorder, recurrent severe without psychotic features ([place]) 06/24/2018   IBS (irritable bowel syndrome)    Hypertension    Colitis    Adjustment disorder with mixed anxiety and depressed mood 05/11/2017   Alcohol abuse    Anxiety    Gastroesophageal reflux disease    Osteopenia determined by x-ray 08/06/2014   Stress fracture of calcaneus 08/06/2014   Vitamin D  deficiency 12/18/2013   Dementia ([place]) 12/18/2013   Benign microscopic hematuria 07/06/2013   SIADH (syndrome of inappropriate ADH production) ([place]) 06/22/2013   Ankylosing spondylitis ([place]) 06/20/2013   Malnutrition of moderate degree ([place]) 06/20/2013   BPH (benign prostatic hyperplasia) 08/22/2010   History of colonic polyps 08/13/2008   Essential hypertension 07/03/2007   PUD (peptic ulcer disease) 07/03/2007    Past Surgical History:  Procedure Laterality Date   BIOPSY  11/26/2018   Procedure: BIOPSY;  Surgeon: Gatha Mayer, MD;  Location: [facility];  Service: Endoscopy;;   COLONOSCOPY W/ BIOPSIES     COLONOSCOPY WITH PROPOFOL N/A 11/26/2018   Procedure: COLONOSCOPY WITH PROPOFOL;  Surgeon: Gatha Mayer, MD;  Location: [facility];  Service: Endoscopy;  Laterality: N/A;   ESOPHAGOGASTRODUODENOSCOPY     ESOPHAGOGASTRODUODENOSCOPY (EGD) WITH PROPOFOL N/A 11/26/2018   Procedure: ESOPHAGOGASTRODUODENOSCOPY (EGD) WITH PROPOFOL;  Surgeon: Gatha Mayer, MD;  Location: [facility];  Service: Endoscopy;  Laterality: N/A;   HEMOSTASIS CLIP PLACEMENT  11/26/2018   Procedure: HEMOSTASIS CLIP PLACEMENT;  Surgeon: Gatha Mayer, MD;  Location: [facility];  Service: Endoscopy;;   HERNIA REPAIR Bilateral    HOT HEMOSTASIS N/A 11/26/2018   Procedure: HOT HEMOSTASIS (  ARGON PLASMA COAGULATION/BICAP);  Surgeon: Gatha Mayer, MD;  Location: [facility];  Service: Endoscopy;  Laterality: N/A;   OPEN REDUCTION INTERNAL FIXATION (ORIF) DISTAL RADIAL FRACTURE Right 02/11/2018   Procedure: OPEN REDUCTION INTERNAL FIXATION (ORIF) DISTAL RADIAL FRACTURE;  Surgeon: Milly Jakob, MD;  Location: [facility];  Service: Orthopedics;  Laterality: Right;   POLYPECTOMY  11/26/2018   Procedure: POLYPECTOMY;  Surgeon: Gatha Mayer, MD;  Location: [facility];  Service: Endoscopy;;   TONSILLECTOMY         Family History  Problem Relation Age of Onset   Anxiety disorder Mother    Congestive Heart  Failure Mother    Crohn's disease Mother    Colon cancer Mother 52   Arthritis Father    High blood pressure Father    Crohn's disease Father     Social History   Tobacco Use   Smoking status: Former    Types: Cigarettes    Quit date: 2014    Years since quitting: 8.8   Smokeless tobacco: Never  Vaping Use   Vaping Use: Never used  Substance Use Topics   Alcohol use: Yes    Comment: 2 25 oz of beer a night   Drug use: Yes    Types: Marijuana    Comment: other night    Home Medications Prior to Admission medications   Medication Sig Start Date End Date Taking? Authorizing Provider  acetaminophen (TYLENOL) 500 MG tablet Take 500-1,000 mg by mouth every 6 (six) hours as needed (for pain).   Yes [provider]  diclofenac Sodium (VOLTAREN) 1 % GEL Apply 2 g topically 4 (four) times daily as needed (to painful sites).   Yes [provider]  lisinopril (PRINIVIL,ZESTRIL) 10 MG tablet Take 1 tablet (10 mg total) by mouth daily. 08/21/17 06/04/21 Yes Lacroce, Hulen Shouts, MD  Multiple Vitamin (MULTIVITAMIN WITH MINERALS) TABS tablet Take 1 tablet by mouth daily. (May buy over the counter) 06/28/18  Yes Connye Burkitt, NP  pantoprazole (PROTONIX) 40 MG tablet Take 1 tablet (40 mg total) by mouth daily. Patient taking differently: Take 40 mg by mouth in the morning. 04/24/14  Yes Janith Lima, MD  propranolol (INDERAL) 10 MG tablet Take 10 mg by mouth 2 (two) times daily. 04/05/21  Yes [provider]  sucralfate (CARAFATE) 1 g tablet Take 1 g by mouth 4 (four) times daily. 04/05/21  Yes [provider]  tiZANidine (ZANAFLEX) 4 MG tablet Take 4 mg by mouth 3 (three) times daily as needed for muscle spasms. 03/05/21  Yes [provider]  verapamil (CALAN-SR) 240 MG CR tablet Take 240 mg by mouth daily. 02/09/17  Yes [provider]  clonazePAM (KLONOPIN) 0.5 MG tablet Take 0.5 mg by mouth 3 (three) times daily.    [provider]   clonazePAM (KLONOPIN) 1 MG tablet Take 1 mg by mouth 3 (three) times daily as needed. Patient not taking: No sig reported 02/11/21   [provider]  DULoxetine (CYMBALTA) 30 MG capsule Take 60 mg by mouth daily. Patient not taking: Reported on 04/04/2021 03/06/21   [provider]  famotidine (PEPCID) 20 MG tablet Take 1 tablet (20 mg total) by mouth 2 (two) times daily. Patient not taking: Reported on 04/26/2021 04/18/21   Carlisle Cater, PA-C  methocarbamol (ROBAXIN) 500 MG tablet Take 500 mg by mouth every 8 (eight) hours as needed for muscle spasms.    [provider]  ondansetron (ZOFRAN ODT) 4 MG  disintegrating tablet Take 1 tablet (4 mg total) by mouth every 4 (four) hours as needed for nausea or vomiting. Patient not taking: Reported on 04/26/2021 03/24/21   Charlesetta Shanks, MD  QUEtiapine (SEROQUEL) 300 MG tablet Take 300 mg by mouth at bedtime.    [provider]  sucralfate (CARAFATE) 1 GM/10ML suspension Take 10 mLs (1 g total) by mouth 4 (four) times daily -  with meals and at bedtime. 03/24/21   Charlesetta Shanks, MD  zolpidem (AMBIEN) 5 MG tablet Take 5 mg by mouth at bedtime.    [provider]    Allergies    Nsaids, Diphenhydramine hcl, Flexeril [cyclobenzaprine], Hctz [hydrochlorothiazide], Rexulti [brexpiprazole], Seroquel [quetiapine], Gabapentin, and Hydroxyzine  Review of Systems   Review of Systems  Psychiatric/Behavioral:  Positive for sleep disturbance and suicidal ideas.   All other systems reviewed and are negative.  Physical Exam Updated Vital Signs BP (!) 143/77 (BP Location: Right Arm)   Pulse 71   Temp 98.4 F (36.9 C) (Oral)   Resp 18   SpO2 94%   Physical Exam Vitals and nursing note reviewed. Exam conducted with a chaperone present.  Constitutional:      Appearance: Normal appearance.  HENT:     Head: Normocephalic and atraumatic.     Right Ear: External ear normal.     Left Ear: External ear normal.      Nose: Nose normal.     Mouth/Throat:     Mouth: Mucous membranes are dry.  Eyes:     Extraocular Movements: Extraocular movements intact.     Conjunctiva/sclera: Conjunctivae normal.     Pupils: Pupils are equal, round, and reactive to light.  Cardiovascular:     Rate and Rhythm: Normal rate and regular rhythm.     Pulses: Normal pulses.     Heart sounds: Normal heart sounds.  Pulmonary:     Effort: Pulmonary effort is normal.     Breath sounds: Normal breath sounds.  Abdominal:     General: Abdomen is flat. Bowel sounds are normal.     Palpations: Abdomen is soft.  Genitourinary:    Penis: Normal.   Musculoskeletal:        General: Normal range of motion.     Cervical back: Normal range of motion and neck supple.  Skin:    General: Skin is warm.     Capillary Refill: Capillary refill takes less than 2 seconds.  Neurological:     General: No focal deficit present.     Mental Status: He is alert and oriented to person, place, and time.  Psychiatric:        Mood and Affect: Mood is anxious.        Thought Content: Thought content includes suicidal ideation. Thought content includes suicidal plan.    ED Results / Procedures / Treatments   Labs (all labs ordered are listed, but only abnormal results are displayed) Labs Reviewed  COMPREHENSIVE METABOLIC PANEL - Abnormal; Notable for the following components:      Result Value   Sodium 131 (*)    Chloride 96 (*)    Glucose, Bld 100 (*)    BUN 6 (*)    All other components within normal limits  SALICYLATE LEVEL - Abnormal; Notable for the following components:   Salicylate Lvl <8.8 (*)    All other components within normal limits  ACETAMINOPHEN LEVEL - Abnormal; Notable for the following components:   Acetaminophen (Tylenol), Serum <10 (*)  All other components within normal limits  RAPID URINE DRUG SCREEN, HOSP PERFORMED - Abnormal; Notable for the following components:   Benzodiazepines POSITIVE (*)     Tetrahydrocannabinol POSITIVE (*)    All other components within normal limits  URINALYSIS, ROUTINE W REFLEX MICROSCOPIC - Abnormal; Notable for the following components:   Glucose, UA 50 (*)    All other components within normal limits  RESP PANEL BY RT-PCR (FLU A&B, COVID) ARPGX2  ETHANOL  CBC    EKG None  Radiology No results found.  Procedures Procedures   Medications Ordered in ED Medications  acetaminophen (TYLENOL) tablet 500-1,000 mg (has no administration in time range)  diclofenac Sodium (VOLTAREN) 1 % topical gel 2 g (has no administration in time range)  methocarbamol (ROBAXIN) tablet 500 mg (has no administration in time range)  sucralfate (CARAFATE) tablet 1 g (1 g Oral Patient Refused/Not Given 04/26/21 1529)  verapamil (CALAN-SR) CR tablet 240 mg (has no administration in time range)  zolpidem (AMBIEN) tablet 5 mg (has no administration in time range)  lisinopril (ZESTRIL) tablet 10 mg (has no administration in time range)  propranolol (INDERAL) tablet 10 mg (has no administration in time range)  LORazepam (ATIVAN) tablet 1 mg (1 mg Oral Given 04/26/21 1153)    ED Course  I have reviewed the triage vital signs and the nursing notes.  Pertinent labs & imaging results that were available during my care of the patient were reviewed by me and considered in my medical decision making (see chart for details).    MDM Rules/Calculators/A&P                           Pt is medically clear for TTS eval.  I have also consulted SW as he is here nearly every day.  Penis nl.  UA nl.  Unclear etiology of dysuria.  Possibly due to low fluid intake.    Disposition per TTS.  TTS did see pt.  He is chronically suicidal and we don't think he is acutely suicidal.  His main issue is that he abuses his ativan and runs out and wants more.  I did ask SW to see him and they have nothing to offer him.  Pt is stable for d/c.  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

## 2021-04-26 NOTE — ED Notes (Signed)
Ativan 0.44m given IV, wasted amt 1.523mwitnessed by ChStark JockRN in wa[place]el[place] Corporationon 04/25/21 at [facility]

## 2021-04-26 NOTE — ED Notes (Addendum)
Pt has a large coat, long sleeve shirt, short sleeve shirt, pant, pair of shoes, wallet, phone and sunglasses. 2 bags have been placed behind triage area.

## 2021-04-26 NOTE — ED Notes (Signed)
Patient refused vital signs. Transportation arranged to take patient home.

## 2021-04-27 ENCOUNTER — Emergency Department (HOSPITAL_COMMUNITY): Payer: Medicare Other

## 2021-04-27 ENCOUNTER — Emergency Department (HOSPITAL_COMMUNITY)
Admission: EM | Admit: 2021-04-27 | Discharge: 2021-04-27 | Disposition: A | Discharge status: Home / Self Care | Payer: Medicare Other | Attending: Emergency Medicine | Admitting: Emergency Medicine

## 2021-04-27 ENCOUNTER — Encounter (HOSPITAL_COMMUNITY): Payer: Self-pay | Admitting: Emergency Medicine

## 2021-04-27 ENCOUNTER — Other Ambulatory Visit: Payer: Self-pay

## 2021-04-27 DIAGNOSIS — F332 Major depressive disorder, recurrent severe without psychotic features: Secondary | ICD-10-CM | POA: Diagnosis not present

## 2021-04-27 DIAGNOSIS — Z87891 Personal history of nicotine dependence: Secondary | ICD-10-CM | POA: Diagnosis not present

## 2021-04-27 DIAGNOSIS — Z765 Malingerer [conscious simulation]: Secondary | ICD-10-CM | POA: Diagnosis not present

## 2021-04-27 DIAGNOSIS — R45851 Suicidal ideations: Secondary | ICD-10-CM | POA: Diagnosis not present

## 2021-04-27 DIAGNOSIS — Z79899 Other long term (current) drug therapy: Secondary | ICD-10-CM | POA: Insufficient documentation

## 2021-04-27 DIAGNOSIS — F0390 Unspecified dementia without behavioral disturbance: Secondary | ICD-10-CM | POA: Diagnosis not present

## 2021-04-27 DIAGNOSIS — I10 Essential (primary) hypertension: Secondary | ICD-10-CM | POA: Diagnosis not present

## 2021-04-27 DIAGNOSIS — Z046 Encounter for general psychiatric examination, requested by authority: Secondary | ICD-10-CM | POA: Diagnosis present

## 2021-04-27 MED ORDER — LORAZEPAM 2 MG/ML IJ SOLN
1.0000 mg | Freq: Once | INTRAMUSCULAR | Status: AC
  Administered 2021-04-27: 1 mg via INTRAMUSCULAR
  Filled 2021-04-27: qty 1

## 2021-04-27 NOTE — ED Notes (Signed)
While sitting with pt he continues to state "I am going to die" "I am going to kill myself" "I'm going to fall and bust my head open to die" "Those other hospitals didn't believe me that I actually would try" Tried to redirect pt by talking about other subjects but eventually pt goes back to stating that he is going to kill himself or tries to get out of the reclining chair. Pt is currently in a reclining chair since pt stated they could not lay down on the mattress. Pt continues to try and get out of the reclining chair. Pt has been provided with apple sauce and a sprite. Meal tray has also been delivered, will give to pt when they return from [place].

## 2021-04-27 NOTE — ED Notes (Signed)
Patient brought from room 12, fully dressed assisted by RN with ambulating. Report is patient is trying to fall in an attempt to kill himself. Spoke with Camera operator as to whether the patient was appropriate for the environment. Told there would be a tech available to sit with the patient 1:1.

## 2021-04-27 NOTE — ED Provider Notes (Signed)
Emergency Medicine Provider Triage Evaluation Note  Anthony Lamb , a 65 y.o. male  was evaluated in triage.  Pt complains of si and fall. [facility] and reportedly had a fall. Ems states pt did not hit head. He did not report any pain from the fall  Review of Systems  Positive: Fall, si Negative: hi  Physical Exam  BP (!) 148/81 (BP Location: Right Arm)   Pulse 68   Temp 98.3 F (36.8 C) (Oral)   Resp 18   SpO2 97%  Gen:   Awake, no distress   Resp:  Normal effort  MSK:   Moves extremities without difficulty  Other:  si  Medical Decision Making  Medically screening exam initiated at 3:10 PM.  Appropriate orders placed.  CRUE OTERO was informed that the remainder of the evaluation will be completed by another provider, this initial triage assessment does not replace that evaluation, and the importance of remaining in the ED until their evaluation is complete.     Rodney Booze, PA-C 04/27/21 1512    Lorelle Gibbs, DO 04/27/21 1624

## 2021-04-27 NOTE — ED Triage Notes (Signed)
Per [facility] patient coming from [facility] for a fall. Patient does not remember fall but c/o suicidal thoughts.

## 2021-04-27 NOTE — ED Notes (Signed)
Patient given discharge instructions, all questions answered. Patient in possession of all belongings, directed to the discharge area. Patient was discharged by Social Work who arranged cab transport. Unable to go over discharge instructions or get last set of vital signs prior to patient leaving

## 2021-04-27 NOTE — [facility] Counselor (Signed)
Requested cart at 3:55

## 2021-04-27 NOTE — Progress Notes (Signed)
Pt is psych cleared per Molly Maduro, NP. This CSW will now remove pt from [facility] shift report.TOC will assist with pt and pt's discharge needs.   Benjaman Kindler, MSW, [facility] 04/27/2021 7:43 PM
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
## 2021-04-27 NOTE — ED Provider Notes (Addendum)
[facility] EMERGENCY DEPARTMENT Provider Note   CSN: [id number] Arrival date & time: 04/27/21  1502     History Chief Complaint  Patient presents with   Suicidal    Pt presented to the ED with complaint of fall and also continued suicidal ideation.    Anthony Lamb is a 65 y.o. male.  HPI  65 year old male presenting to the emergency department with suicidal ideation.  He has presented to the emergency department multiple times over the past few days with similar complaints.  He is well-known to the emergency department and has been diagnosed with malingering with attempts at presenting to the emergency department for secondary gain in order to obtain medications.  He states today that he is suicidal.  He was last seen in the emergency department yesterday with a similar complaint.  He was deemed to not meet criteria for inpatient admission with chronic suicidal ideation and malingering behavior with secondary gain of med seeking behavior.  No HI or AVH.  Past Medical History:  Diagnosis Date   Alcohol abuse, in remission    Anal fissure    Anemia    Ankylosing spondylitis (HCC)    Anxiety    Arthritis    Chronic diarrhea    Chronic headaches    Chronic pain    Colitis    Colon polyps    Depression    Esophagitis    Gastric polyps    hyperplastic and fundic gland   Gastritis    GERD (gastroesophageal reflux disease)    Hypertension    IBS (irritable bowel syndrome)    Poor dentition    SIADH (syndrome of inappropriate ADH production) ([place])     Patient Active Problem List   Diagnosis Date Noted   Suicidal ideation 04/05/2021   MDD (major depressive disorder), recurrent episode, severe ([place]) 03/31/2021   Substance induced mood disorder ([place]) 03/31/2021   Family history of colon cancer 12/02/2018   Gastric polyps    Schatzki's ring    Left sided ulcerative colitis without complication ([place])    Odynophagia 11/25/2018   Dysphagia 11/25/2018    Major depressive disorder, recurrent severe without psychotic features ([place]) 06/24/2018   IBS (irritable bowel syndrome)    Hypertension    Colitis    Adjustment disorder with mixed anxiety and depressed mood 05/11/2017   Alcohol abuse    Anxiety    Gastroesophageal reflux disease    Osteopenia determined by x-ray 08/06/2014   Stress fracture of calcaneus 08/06/2014   Vitamin D deficiency 12/18/2013   Dementia ([place]) 12/18/2013   Benign microscopic hematuria 07/06/2013   SIADH (syndrome of inappropriate ADH production) ([place]) 06/22/2013   Ankylosing spondylitis ([place]) 06/20/2013   Malnutrition of moderate degree ([place]) 06/20/2013   BPH (benign prostatic hyperplasia) 08/22/2010   History of colonic polyps 08/13/2008   Essential hypertension 07/03/2007   PUD (peptic ulcer disease) 07/03/2007    Past Surgical History:  Procedure Laterality Date   BIOPSY  11/26/2018   Procedure: BIOPSY;  Surgeon: Gatha Mayer, MD;  Location: [facility];  Service: Endoscopy;;   COLONOSCOPY W/ BIOPSIES     COLONOSCOPY WITH PROPOFOL N/A 11/26/2018   Procedure: COLONOSCOPY WITH PROPOFOL;  Surgeon: Gatha Mayer, MD;  [facility]: [facility];  Service: Endoscopy;  Laterality: N/A;   ESOPHAGOGASTRODUODENOSCOPY     ESOPHAGOGASTRODUODENOSCOPY (EGD) WITH PROPOFOL N/A 11/26/2018   Procedure: ESOPHAGOGASTRODUODENOSCOPY (EGD) WITH PROPOFOL;  Surgeon: Gatha Mayer, MD;  [facility];  Service: Endoscopy;  Laterality: N/A;   HEMOSTASIS CLIP PLACEMENT  11/26/2018   Procedure: HEMOSTASIS CLIP PLACEMENT;  Surgeon: Gatha Mayer, MD;  [facility];  Service: Endoscopy;;   HERNIA REPAIR Bilateral    HOT HEMOSTASIS N/A 11/26/2018   Procedure: HOT HEMOSTASIS (ARGON PLASMA COAGULATION/BICAP);  Surgeon: Gatha Mayer, MD;  Location: [facility];  Service: Endoscopy;  Laterality: N/A;   OPEN REDUCTION INTERNAL FIXATION (ORIF) DISTAL RADIAL FRACTURE Right 02/11/2018   Procedure: OPEN REDUCTION  INTERNAL FIXATION (ORIF) DISTAL RADIAL FRACTURE;  Surgeon: Milly Jakob, MD;  Location: [facility];  Service: Orthopedics;  Laterality: Right;   POLYPECTOMY  11/26/2018   Procedure: POLYPECTOMY;  Surgeon: Gatha Mayer, MD;  [facility];  Service: Endoscopy;;   TONSILLECTOMY         Family History  Problem Relation Age of Onset   Anxiety disorder Mother    Congestive Heart Failure Mother    Crohn's disease Mother    Colon cancer Mother 66   Arthritis Father    High blood pressure Father    Crohn's disease Father     Social History   Tobacco Use   Smoking status: Former    Types: Cigarettes    Quit date: 2014    Years since quitting: 8.8   Smokeless tobacco: Never  Vaping Use   Vaping Use: Never used  Substance Use Topics   Alcohol use: Yes    Comment: 2 25 oz of beer a night   Drug use: Yes    Types: Marijuana    Comment: other night    Home Medications Prior to Admission medications   Medication Sig Start Date End Date Taking? Authorizing Provider  acetaminophen (TYLENOL) 500 MG tablet Take 500-1,000 mg by mouth every 6 (six) hours as needed (for pain).   Yes [provider]  diclofenac Sodium (VOLTAREN) 1 % GEL Apply 2 g topically 4 (four) times daily as needed (to painful sites).   Yes [provider]  lisinopril (PRINIVIL,ZESTRIL) 10 MG tablet Take 1 tablet (10 mg total) by mouth daily. 08/21/17 06/04/21 Yes Lacroce, Hulen Shouts, MD  methocarbamol (ROBAXIN) 500 MG tablet Take 500 mg by mouth every 8 (eight) hours as needed for muscle spasms.   Yes [provider]  Multiple Vitamin (MULTIVITAMIN WITH MINERALS) TABS tablet Take 1 tablet by mouth daily. (May buy over the counter) Patient taking differently: Take 1 tablet by mouth daily. 06/28/18  Yes Connye Burkitt, NP  pantoprazole (PROTONIX) 40 MG tablet Take 1 tablet (40 mg total) by mouth daily. Patient taking differently: Take 40 mg by mouth in the morning. 04/24/14  Yes Janith Lima, MD  propranolol (INDERAL) 10 MG tablet Take 10 mg by mouth 2 (two) times daily. 04/05/21  Yes [provider]  QUEtiapine (SEROQUEL) 300 MG tablet Take 300 mg by mouth at bedtime.   Yes [provider]  sucralfate (CARAFATE) 1 g tablet Take 1 g by mouth 4 (four) times daily. 04/05/21  Yes [provider]  tiZANidine (ZANAFLEX) 4 MG tablet Take 4 mg by mouth 3 (three) times daily as needed for muscle spasms. 03/05/21  Yes [provider]  verapamil (CALAN-SR) 240 MG CR tablet Take 240 mg by mouth daily. 02/09/17  Yes [provider]  zolpidem (AMBIEN) 5 MG tablet Take 5 mg by mouth at bedtime.   Yes [provider]  famotidine (PEPCID) 20 MG tablet Take 1 tablet (20 mg total) by mouth 2 (two) times daily.  Patient not taking: Reported on 04/26/2021 04/18/21   Carlisle Cater, PA-C  ondansetron (ZOFRAN ODT) 4 MG disintegrating tablet Take 1 tablet (4 mg total) by mouth every 4 (four) hours as needed for nausea or vomiting. Patient not taking: Reported on 04/26/2021 03/24/21   Charlesetta Shanks, MD  sucralfate (CARAFATE) 1 GM/10ML suspension Take 10 mLs (1 g total) by mouth 4 (four) times daily -  with meals and at bedtime. Patient not taking: Reported on 04/27/2021 03/24/21   Charlesetta Shanks, MD    Allergies    Nsaids, Diphenhydramine hcl, Flexeril [cyclobenzaprine], Hctz [hydrochlorothiazide], Rexulti [brexpiprazole], Seroquel [quetiapine], Gabapentin, and Hydroxyzine  Review of Systems   Review of Systems  Psychiatric/Behavioral:  Positive for suicidal ideas.   All other systems reviewed and are negative.  Physical Exam Updated Vital Signs BP (!) 148/81 (BP Location: Right Arm)   Pulse 68   Temp 98.3 F (36.8 C) (Oral)   Resp 18   SpO2 97%   Physical Exam Vitals and nursing note reviewed.  Constitutional:      General: He is not in acute distress. HENT:     Head: Normocephalic and atraumatic.  Eyes:     Conjunctiva/sclera:  Conjunctivae normal.     Pupils: Pupils are equal, round, and reactive to light.  Cardiovascular:     Rate and Rhythm: Normal rate and regular rhythm.  Pulmonary:     Effort: Pulmonary effort is normal. No respiratory distress.  Abdominal:     General: There is no distension.     Tenderness: There is no guarding.  Musculoskeletal:        General: No deformity or signs of injury.     Cervical back: Neck supple.  Skin:    Findings: No lesion or rash.  Neurological:     General: No focal deficit present.     Mental Status: He is alert. Mental status is at baseline.     Cranial Nerves: No cranial nerve deficit.     Sensory: No sensory deficit.     Motor: No weakness.  Psychiatric:        Mood and Affect: Mood is anxious.        Thought Content: Thought content includes suicidal ideation.     Comments: Pt keeps stating "I'm gonna die"    ED Results / Procedures / Treatments   Labs (all labs ordered are listed, but only abnormal results are displayed) Labs Reviewed - No data to display  EKG None  Radiology No results found.  Procedures Procedures   Medications Ordered in ED Medications  LORazepam (ATIVAN) injection 1 mg (1 mg Intramuscular Given 04/27/21 1553)    ED Course  I have reviewed the triage vital signs and the nursing notes.  Pertinent labs & imaging results that were available during my care of the patient were reviewed by me and considered in my medical decision making (see chart for details).    MDM Rules/Calculators/A&P                           65 year old male presenting to the emergency department with suicidal ideation.  He has presented to the emergency department multiple times over the past few days with similar complaints.  He is well-known to the emergency department and has been diagnosed with malingering with attempts at presenting to the emergency department for secondary gain in order to obtain medications.  He states today that he is  suicidal.  He  was last seen in the emergency department yesterday with a similar complaint.  He was deemed to not meet criteria for inpatient admission with chronic suicidal ideation and malingering behavior with secondary gain of med seeking behavior.  No HI or AVH. Pt exam unremarkable, with soft, non-tender abdomen. Chronic SI. Did receive 6m IM Ativan for anxious behavior in triage. CT Head obtained in first look due to a reported fall today, reviewed by myself and negative. Medically cleared. TTS consult placed.    Final Clinical Impression(s) / ED Diagnoses Final diagnoses:  Suicidal ideation  Malingering    Rx / DC Orders ED Discharge Orders     None        LRegan Lemming MD 04/27/21 1722    LRegan Lemming MD 04/27/21 1818

## 2021-04-27 NOTE — ED Notes (Signed)
Kayla, NT is sitting with pt.

## 2021-04-27 NOTE — ED Notes (Signed)
Pt transported to CT ?

## 2021-04-27 NOTE — ED Notes (Signed)
PT repeatedly asking for anxiety medication at this time. RN made aware. PT redirectable at this time.

## 2021-04-27 NOTE — Social Work (Signed)
CSW was consulted for transportation needs. CSW met with Pt at bedside. Pt states that he is unable to walk and so cannot be discharged from ED. Pt has been seen by EDP as well as [place] and has been cleared medically and does not meet any BH criteria. In an effort to gather collateral information, CSW called Pt's listed contact number and spoke with Ms. Pervus who states she is Pt's peer support specialist. Per Ms. Pervus, Pt deals with anxiety and depression but has no issues with ambulation at baseline. Pt reported escalation in anxiety when informed that he was ready for discharge. CSW attempted to work with Pt on breathing exercises, but Pt declined. Pt reports that he is out of anxiety medication because he took more than the prescribed dosage and he is not able to get a new prescription filled. CSW explained that he must consult his PCP for med refills. CSW added [place] address and phone number to AVS as Pt recently turned 32 and has not signed up for Medicare. CSW provided sack lunch and taxi voucher for discharge. CSW accompanied Pt to taxi and taxi driver reported to [facility] [place] that driver would escort Pt to the door.

## 2021-04-27 NOTE — ED Notes (Signed)
PT belongings placed in locker 8. PT valuables with security.

## 2021-04-27 NOTE — Discharge Instructions (Addendum)
Call or go to: [facility]  [phone]  For assistance with applying for Medicare and Food Stamps

## 2021-04-29 ENCOUNTER — Other Ambulatory Visit: Payer: Self-pay

## 2021-04-29 ENCOUNTER — Encounter (HOSPITAL_BASED_OUTPATIENT_CLINIC_OR_DEPARTMENT_OTHER): Payer: Self-pay

## 2021-04-29 ENCOUNTER — Emergency Department (HOSPITAL_BASED_OUTPATIENT_CLINIC_OR_DEPARTMENT_OTHER)
Admission: EM | Admit: 2021-04-29 | Discharge: 2021-04-29 | Disposition: A | Discharge status: Home / Self Care | Payer: Medicare Other | Attending: Emergency Medicine | Admitting: Emergency Medicine

## 2021-04-29 DIAGNOSIS — F419 Anxiety disorder, unspecified: Secondary | ICD-10-CM | POA: Insufficient documentation

## 2021-04-29 DIAGNOSIS — R110 Nausea: Secondary | ICD-10-CM

## 2021-04-29 DIAGNOSIS — Z79899 Other long term (current) drug therapy: Secondary | ICD-10-CM | POA: Insufficient documentation

## 2021-04-29 DIAGNOSIS — Z87891 Personal history of nicotine dependence: Secondary | ICD-10-CM | POA: Insufficient documentation

## 2021-04-29 DIAGNOSIS — R112 Nausea with vomiting, unspecified: Secondary | ICD-10-CM | POA: Diagnosis not present

## 2021-04-29 DIAGNOSIS — I10 Essential (primary) hypertension: Secondary | ICD-10-CM | POA: Insufficient documentation

## 2021-04-29 MED ORDER — ONDANSETRON 4 MG PO TBDP
4.0000 mg | ORAL_TABLET | Freq: Once | ORAL | Status: AC
  Administered 2021-04-29: 4 mg via ORAL
  Filled 2021-04-29: qty 1

## 2021-04-29 MED ORDER — ONDANSETRON 4 MG PO TBDP: 4.0000 mg | ORAL_TABLET | ORAL | 0 refills | Status: DC | PRN

## 2021-04-29 NOTE — ED Notes (Signed)
Blood sent to lab for on hold

## 2021-04-29 NOTE — ED Triage Notes (Signed)
Nausea and vomiting with decreased appetite since yesterday.

## 2021-04-29 NOTE — ED Provider Notes (Signed)
[facility] EMERGENCY DEPT Provider Note   CSN: [id number] Arrival date & time: 04/29/21  6568     History Chief Complaint  Patient presents with   Nausea   Emesis    Anthony Lamb is a 65 y.o. male.  HPI Patient reports he is very anxious.  He reports he needs something for anxiety.  He reports is making him nauseated and he cannot eat.  He also reports he is concerned that he has cancer throughout his body.  He reports he has pain all over.  Patient reports he needs help managing both pain and anxiety.  Reports he does not know how to deal with these problems.    Past Medical History:  Diagnosis Date   Alcohol abuse, in remission    Anal fissure    Anemia    Ankylosing spondylitis (HCC)    Anxiety    Arthritis    Chronic diarrhea    Chronic headaches    Chronic pain    Colitis    Colon polyps    Depression    Esophagitis    Gastric polyps    hyperplastic and fundic gland   Gastritis    GERD (gastroesophageal reflux disease)    Hypertension    IBS (irritable bowel syndrome)    Poor dentition    SIADH (syndrome of inappropriate ADH production) ([place])     Patient Active Problem List   Diagnosis Date Noted   Suicidal ideation 04/05/2021   MDD (major depressive disorder), recurrent episode, severe ([place]) 03/31/2021   Substance induced mood disorder ([place]) 03/31/2021   Family history of colon cancer 12/02/2018   Gastric polyps    Schatzki's ring    Left sided ulcerative colitis without complication ([place])    Odynophagia 11/25/2018   Dysphagia 11/25/2018   Major depressive disorder, recurrent severe without psychotic features ([place]) 06/24/2018   IBS (irritable bowel syndrome)    Hypertension    Colitis    Adjustment disorder with mixed anxiety and depressed mood 05/11/2017   Alcohol abuse    Anxiety    Gastroesophageal reflux disease    Osteopenia determined by x-ray 08/06/2014   Stress fracture of calcaneus 08/06/2014   Vitamin D deficiency  12/18/2013   Dementia ([place]) 12/18/2013   Benign microscopic hematuria 07/06/2013   SIADH (syndrome of inappropriate ADH production) ([place]) 06/22/2013   Ankylosing spondylitis ([place]) 06/20/2013   Malnutrition of moderate degree ([place]) 06/20/2013   BPH (benign prostatic hyperplasia) 08/22/2010   History of colonic polyps 08/13/2008   Essential hypertension 07/03/2007   PUD (peptic ulcer disease) 07/03/2007    Past Surgical History:  Procedure Laterality Date   BIOPSY  11/26/2018   Procedure: BIOPSY;  Surgeon: Gatha Mayer, MD;  [facility];  Service: Endoscopy;;   COLONOSCOPY W/ BIOPSIES     COLONOSCOPY WITH PROPOFOL N/A 11/26/2018   Procedure: COLONOSCOPY WITH PROPOFOL;  Surgeon: Gatha Mayer, MD;  [facility];  Service: Endoscopy;  Laterality: N/A;   ESOPHAGOGASTRODUODENOSCOPY     ESOPHAGOGASTRODUODENOSCOPY (EGD) WITH PROPOFOL N/A 11/26/2018   Procedure: ESOPHAGOGASTRODUODENOSCOPY (EGD) WITH PROPOFOL;  Surgeon: Gatha Mayer, MD;  [facility];  Service: Endoscopy;  Laterality: N/A;   HEMOSTASIS CLIP PLACEMENT  11/26/2018   Procedure: HEMOSTASIS CLIP PLACEMENT;  Surgeon: Gatha Mayer, MD;  [facility];  Service: Endoscopy;;   HERNIA REPAIR Bilateral    HOT HEMOSTASIS N/A 11/26/2018   Procedure: HOT HEMOSTASIS (ARGON PLASMA COAGULATION/BICAP);  Surgeon: Gatha Mayer, MD;  [facility];  Service: Endoscopy;  Laterality: N/A;   OPEN REDUCTION INTERNAL FIXATION (ORIF) DISTAL RADIAL FRACTURE Right 02/11/2018   Procedure: OPEN REDUCTION INTERNAL FIXATION (ORIF) DISTAL RADIAL FRACTURE;  Surgeon: Milly Jakob, MD;  Location: [facility];  Service: Orthopedics;  Laterality: Right;   POLYPECTOMY  11/26/2018   Procedure: POLYPECTOMY;  Surgeon: Gatha Mayer, MD;  [facility];  Service: Endoscopy;;   TONSILLECTOMY         Family History  Problem Relation Age of Onset   Anxiety disorder Mother    Congestive Heart Failure  Mother    Crohn's disease Mother    Colon cancer Mother 54   Arthritis Father    High blood pressure Father    Crohn's disease Father     Social History   Tobacco Use   Smoking status: Former    Types: Cigarettes    Quit date: 2014    Years since quitting: 8.9   Smokeless tobacco: Never  Vaping Use   Vaping Use: Never used  Substance Use Topics   Alcohol use: Not Currently    Comment: 2 25 oz of beer a night   Drug use: Yes    Types: Marijuana    Home Medications Prior to Admission medications   Medication Sig Start Date End Date Taking? Authorizing Provider  ondansetron (ZOFRAN-ODT) 4 MG disintegrating tablet Take 1 tablet (4 mg total) by mouth every 4 (four) hours as needed for nausea or vomiting. 04/29/21  Yes Charlesetta Shanks, MD  acetaminophen (TYLENOL) 500 MG tablet Take 500-1,000 mg by mouth every 6 (six) hours as needed (for pain).    [provider]  diclofenac Sodium (VOLTAREN) 1 % GEL Apply 2 g topically 4 (four) times daily as needed (to painful sites).    [provider]  famotidine (PEPCID) 20 MG tablet Take 1 tablet (20 mg total) by mouth 2 (two) times daily. Patient not taking: Reported on 04/26/2021 04/18/21   Carlisle Cater, PA-C  lisinopril (PRINIVIL,ZESTRIL) 10 MG tablet Take 1 tablet (10 mg total) by mouth daily. 08/21/17 06/04/21  Lacroce, Hulen Shouts, MD  methocarbamol (ROBAXIN) 500 MG tablet Take 500 mg by mouth every 8 (eight) hours as needed for muscle spasms.    [provider]  Multiple Vitamin (MULTIVITAMIN WITH MINERALS) TABS tablet Take 1 tablet by mouth daily. (May buy over the counter) Patient taking differently: Take 1 tablet by mouth daily. 06/28/18   Connye Burkitt, NP  ondansetron (ZOFRAN ODT) 4 MG disintegrating tablet Take 1 tablet (4 mg total) by mouth every 4 (four) hours as needed for nausea or vomiting. Patient not taking: Reported on 04/26/2021 03/24/21   Charlesetta Shanks, MD  pantoprazole (PROTONIX) 40 MG tablet  Take 1 tablet (40 mg total) by mouth daily. Patient taking differently: Take 40 mg by mouth in the morning. 04/24/14   Janith Lima, MD  propranolol (INDERAL) 10 MG tablet Take 10 mg by mouth 2 (two) times daily. 04/05/21   [provider]  QUEtiapine (SEROQUEL) 300 MG tablet Take 300 mg by mouth at bedtime.    [provider]  sucralfate (CARAFATE) 1 g tablet Take 1 g by mouth 4 (four) times daily. 04/05/21   [provider]  sucralfate (CARAFATE) 1 GM/10ML suspension Take 10 mLs (1 g total) by mouth 4 (four) times daily -  with meals and at bedtime. Patient not taking: Reported on 04/27/2021 03/24/21   Charlesetta Shanks, MD  tiZANidine (ZANAFLEX) 4 MG tablet  Take 4 mg by mouth 3 (three) times daily as needed for muscle spasms. 03/05/21   [provider]  verapamil (CALAN-SR) 240 MG CR tablet Take 240 mg by mouth daily. 02/09/17   [provider]  zolpidem (AMBIEN) 5 MG tablet Take 5 mg by mouth at bedtime.    [provider]    Allergies    Nsaids, Diphenhydramine hcl, Flexeril [cyclobenzaprine], Hctz [hydrochlorothiazide], Rexulti [brexpiprazole], Seroquel [quetiapine], Gabapentin, and Hydroxyzine  Review of Systems   Review of Systems Pan positive review of systems Physical Exam Updated Vital Signs BP (!) 157/82 (BP Location: Left Arm)   Pulse 66   Temp 97.7 F (36.5 C) (Oral)   Resp 20   Ht 5' 10"  (1.778 m)   Wt 67.1 kg   SpO2 100%   BMI 21.24 kg/m   Physical Exam Constitutional:      Comments: Patient has thin but physically well in appearance.  No respiratory distress.  Anxious.  HENT:     Head: Normocephalic and atraumatic.     Mouth/Throat:     Mouth: Mucous membranes are moist.     Pharynx: Oropharynx is clear.  Eyes:     Extraocular Movements: Extraocular movements intact.  Cardiovascular:     Rate and Rhythm: Normal rate and regular rhythm.  Pulmonary:     Effort: Pulmonary effort is normal.     Breath sounds:  Normal breath sounds.  Abdominal:     General: There is no distension.     Palpations: Abdomen is soft.     Tenderness: There is no abdominal tenderness. There is no guarding.  Musculoskeletal:     Comments: No peripheral edema.  Calf soft and pliable.  Skin condition good.  Skin:    General: Skin is warm and dry.  Neurological:     Comments: Patient is alert.  No confusion.  All movements are coordinated purposeful symmetric.  Patient has gotten up and ambulated about the room with coordinated gait.  No sign of difficulty getting out of the stretcher standing or walking.  Psychiatric:     Comments: Very anxious.    ED Results / Procedures / Treatments   Labs (all labs ordered are listed, but only abnormal results are displayed) Labs Reviewed - No data to display  EKG None  Radiology CT Head Wo Contrast  Result Date: 04/27/2021 CLINICAL DATA:  Fall EXAM: CT HEAD WITHOUT CONTRAST TECHNIQUE: Contiguous axial images were obtained from the base of the skull through the vertex without intravenous contrast. COMPARISON:  CT brain 01/11/2021 FINDINGS: Brain: No acute territorial infarction, hemorrhage or intracranial mass. The ventricles are nonenlarged Vascular: No hyperdense vessel or unexpected calcification. Skull: Normal. Negative for fracture or focal lesion. Sinuses/Orbits: No acute finding. Other: None IMPRESSION: Negative.  No CT evidence for acute intracranial abnormality Electronically Signed   By: Donavan Foil M.D.   On: 04/27/2021 18:26    Procedures Procedures   Medications Ordered in ED Medications  ondansetron (ZOFRAN-ODT) disintegrating tablet 4 mg (has no administration in time range)    ED Course  I have reviewed the triage vital signs and the nursing notes.  Pertinent labs & imaging results that were available during my care of the patient were reviewed by me and considered in my medical decision making (see chart for details).    MDM Rules/Calculators/A&P  Patient presents with chronic and recurrent complaint of anxiety and generalized pain.  Patient is requesting pain medicine and anxiety medicine and desires admission or placement citing he does not know what to do with himself at home.  Patient has had multiple presentations for similar circumstances.  Multiple evaluations by psychiatry.  Patient is chronically prescribed lorazepam which he frequently runs out of ahead of schedule.  At this time no findings of acute medical illness.  His physical exam is normal.  Vital signs are normal.  Patient's mobility is not impaired at all.  Encouraged patient to continue working closely with his PCP for outpatient support for chronic anxiety and pain. Final Clinical Impression(s) / ED Diagnoses Final diagnoses:  Chronic anxiety  Nausea    Rx / DC Orders ED Discharge Orders          Ordered    ondansetron (ZOFRAN-ODT) 4 MG disintegrating tablet  Every 4 hours PRN        04/29/21 0758             Charlesetta Shanks, MD 04/29/21 [phone]

## 2021-04-29 NOTE — Discharge Instructions (Signed)
1.  Call your family doctor to help with management of chronic anxiety. 2.  You may take Zofran as prescribed for nausea.  Continue your other regularly prescribed medications.

## 2021-05-01 ENCOUNTER — Other Ambulatory Visit: Payer: Self-pay

## 2021-05-01 ENCOUNTER — Encounter (HOSPITAL_BASED_OUTPATIENT_CLINIC_OR_DEPARTMENT_OTHER): Payer: Self-pay | Admitting: Emergency Medicine

## 2021-05-01 ENCOUNTER — Emergency Department (HOSPITAL_BASED_OUTPATIENT_CLINIC_OR_DEPARTMENT_OTHER)
Admission: EM | Admit: 2021-05-01 | Discharge: 2021-05-01 | Disposition: A | Discharge status: Home / Self Care | Payer: Medicare Other | Attending: Emergency Medicine | Admitting: Emergency Medicine

## 2021-05-01 DIAGNOSIS — R1030 Lower abdominal pain, unspecified: Secondary | ICD-10-CM | POA: Diagnosis not present

## 2021-05-01 DIAGNOSIS — F0390 Unspecified dementia without behavioral disturbance: Secondary | ICD-10-CM | POA: Diagnosis not present

## 2021-05-01 DIAGNOSIS — Z87891 Personal history of nicotine dependence: Secondary | ICD-10-CM | POA: Diagnosis not present

## 2021-05-01 DIAGNOSIS — I10 Essential (primary) hypertension: Secondary | ICD-10-CM | POA: Diagnosis not present

## 2021-05-01 DIAGNOSIS — Z79899 Other long term (current) drug therapy: Secondary | ICD-10-CM | POA: Diagnosis not present

## 2021-05-01 DIAGNOSIS — R630 Anorexia: Secondary | ICD-10-CM | POA: Insufficient documentation

## 2021-05-01 DIAGNOSIS — R531 Weakness: Secondary | ICD-10-CM | POA: Insufficient documentation

## 2021-05-01 LAB — URINALYSIS, ROUTINE W REFLEX MICROSCOPIC
Bilirubin Urine: NEGATIVE
Glucose, UA: NEGATIVE mg/dL
Hgb urine dipstick: NEGATIVE
Ketones, ur: 15 mg/dL — AB
Leukocytes,Ua: NEGATIVE
Nitrite: NEGATIVE
Protein, ur: NEGATIVE mg/dL
Specific Gravity, Urine: 1.005 (ref 1.005–1.030)
pH: 7 (ref 5.0–8.0)

## 2021-05-01 LAB — CBC WITH DIFFERENTIAL/PLATELET
Abs Immature Granulocytes: 0.02 10*3/uL (ref 0.00–0.07)
Basophils Absolute: 0.1 10*3/uL (ref 0.0–0.1)
Basophils Relative: 1 %
Eosinophils Absolute: 0 10*3/uL (ref 0.0–0.5)
Eosinophils Relative: 1 %
HCT: 38.3 % — ABNORMAL LOW (ref 39.0–52.0)
Hemoglobin: 12.9 g/dL — ABNORMAL LOW (ref 13.0–17.0)
Immature Granulocytes: 0 %
Lymphocytes Relative: 23 %
Lymphs Abs: 1.5 10*3/uL (ref 0.7–4.0)
MCH: 30.9 pg (ref 26.0–34.0)
MCHC: 33.7 g/dL (ref 30.0–36.0)
MCV: 91.8 fL (ref 80.0–100.0)
Monocytes Absolute: 0.6 10*3/uL (ref 0.1–1.0)
Monocytes Relative: 9 %
Neutro Abs: 4.3 10*3/uL (ref 1.7–7.7)
Neutrophils Relative %: 66 %
Platelets: 253 10*3/uL (ref 150–400)
RBC: 4.17 MIL/uL — ABNORMAL LOW (ref 4.22–5.81)
RDW: 12.3 % (ref 11.5–15.5)
WBC: 6.4 10*3/uL (ref 4.0–10.5)
nRBC: 0 % (ref 0.0–0.2)

## 2021-05-01 LAB — COMPREHENSIVE METABOLIC PANEL
ALT: 11 U/L (ref 0–44)
AST: 16 U/L (ref 15–41)
Albumin: 4.1 g/dL (ref 3.5–5.0)
Alkaline Phosphatase: 58 U/L (ref 38–126)
Anion gap: 11 (ref 5–15)
BUN: 6 mg/dL — ABNORMAL LOW (ref 8–23)
CO2: 26 mmol/L (ref 22–32)
Calcium: 8.5 mg/dL — ABNORMAL LOW (ref 8.9–10.3)
Chloride: 98 mmol/L (ref 98–111)
Creatinine, Ser: 0.62 mg/dL (ref 0.61–1.24)
GFR, Estimated: >60 mL/min (ref >60)
Glucose, Bld: 87 mg/dL (ref 70–99)
Potassium: 3.4 mmol/L — ABNORMAL LOW (ref 3.5–5.1)
Sodium: 135 mmol/L (ref 135–145)
Total Bilirubin: 0.6 mg/dL (ref 0.3–1.2)
Total Protein: 6.3 g/dL — ABNORMAL LOW (ref 6.5–8.1)

## 2021-05-01 MED ORDER — LORAZEPAM 1 MG PO TABS
0.5000 mg | ORAL_TABLET | Freq: Once | ORAL | Status: DC
  Filled 2021-05-01: qty 1

## 2021-05-01 MED ORDER — ALUM & MAG HYDROXIDE-SIMETH 200-200-20 MG/5ML PO SUSP
30.0000 mL | Freq: Once | ORAL | Status: AC
  Administered 2021-05-01: 10:00:00 30 mL via ORAL
  Filled 2021-05-01: qty 30

## 2021-05-01 MED ORDER — SODIUM CHLORIDE 0.9 % IV BOLUS
1000.0000 mL | Freq: Once | INTRAVENOUS | Status: AC
  Administered 2021-05-01: 10:00:00 1000 mL via INTRAVENOUS

## 2021-05-01 MED ORDER — LORAZEPAM 1 MG PO TABS
0.5000 mg | ORAL_TABLET | Freq: Once | ORAL | Status: AC
  Administered 2021-05-01: 10:00:00 0.5 mg via ORAL
  Filled 2021-05-01: qty 1

## 2021-05-01 MED ORDER — CHLORDIAZEPOXIDE HCL 25 MG PO CAPS
50.0000 mg | ORAL_CAPSULE | Freq: Once | ORAL | Status: AC
  Administered 2021-05-01: 10:00:00 50 mg via ORAL
  Filled 2021-05-01: qty 2

## 2021-05-01 MED ORDER — KETOROLAC TROMETHAMINE 15 MG/ML IJ SOLN
15.0000 mg | Freq: Once | INTRAMUSCULAR | Status: AC
  Administered 2021-05-01: 10:00:00 15 mg via INTRAVENOUS
  Filled 2021-05-01: qty 1

## 2021-05-01 MED ORDER — CHLORDIAZEPOXIDE HCL 25 MG PO CAPS: ORAL_CAPSULE | ORAL | 0 refills | Status: DC

## 2021-05-01 NOTE — ED Notes (Signed)
Dc instructions reviewed with patient. Patient voiced understanding. Dc with belongings.  °

## 2021-05-01 NOTE — ED Notes (Signed)
Iv did  not give enough blood return for labs. Will hydrate first and attempt later.

## 2021-05-01 NOTE — Discharge Instructions (Signed)
Please eat and drink as well as you can.  Follow-up with your family doctor in the office.  Continue to work with social work to try and find you a optimal living situation.

## 2021-05-01 NOTE — ED Triage Notes (Signed)
Pt presents via EMS with complaints of generalized pain, can't eat, cant sleep.

## 2021-05-01 NOTE — ED Provider Notes (Signed)
[facility] DEPT Provider Note   CSN: [id number] Arrival date & time: 05/01/21  9983     History Chief Complaint  Patient presents with   Generalized Body Aches    Anthony Lamb is a 65 y.o. male.  65 yo M with a chief complaints of pain all over and difficulty urinating and feeling like he is dehydrated.  Tells me that this has been an ongoing issue for him but worsening last night.  Feels like he has had trouble fully emptying his bladder for about 24 to 48 hours.  He denies fevers or chills denies cough or congestion.  Tells me he has ankylosing spondylitis and has pain everywhere.  Is concerned about have to go into a nursing facility.  He is scheduled to have social work see him at his house soon.    The history is provided by the patient.  Illness Severity:  Moderate Onset quality:  Gradual Duration:  2 days Timing:  Constant Progression:  Worsening Chronicity:  Chronic Associated symptoms: abdominal pain and myalgias   Associated symptoms: no chest pain, no congestion, no diarrhea, no fever, no headaches, no rash, no shortness of breath and no vomiting       Past Medical History:  Diagnosis Date   Alcohol abuse, in remission    Anal fissure    Anemia    Ankylosing spondylitis (HCC)    Anxiety    Arthritis    Chronic diarrhea    Chronic headaches    Chronic pain    Colitis    Colon polyps    Depression    Esophagitis    Gastric polyps    hyperplastic and fundic gland   Gastritis    GERD (gastroesophageal reflux disease)    Hypertension    IBS (irritable bowel syndrome)    Poor dentition    SIADH (syndrome of inappropriate ADH production) ([place])     Patient Active Problem List   Diagnosis Date Noted   Suicidal ideation 04/05/2021   MDD (major depressive disorder), recurrent episode, severe ([place]) 03/31/2021   Substance induced mood disorder ([place]) 03/31/2021   Family history of colon cancer 12/02/2018   Gastric polyps     Schatzki's ring    Left sided ulcerative colitis without complication ([place])    Odynophagia 11/25/2018   Dysphagia 11/25/2018   Major depressive disorder, recurrent severe without psychotic features ([place]) 06/24/2018   IBS (irritable bowel syndrome)    Hypertension    Colitis    Adjustment disorder with mixed anxiety and depressed mood 05/11/2017   Alcohol abuse    Anxiety    Gastroesophageal reflux disease    Osteopenia determined by x-ray 08/06/2014   Stress fracture of calcaneus 08/06/2014   Vitamin D deficiency 12/18/2013   Dementia ([place]) 12/18/2013   Benign microscopic hematuria 07/06/2013   SIADH (syndrome of inappropriate ADH production) ([place]) 06/22/2013   Ankylosing spondylitis ([place]) 06/20/2013   Malnutrition of moderate degree ([place]) 06/20/2013   BPH (benign prostatic hyperplasia) 08/22/2010   History of colonic polyps 08/13/2008   Essential hypertension 07/03/2007   PUD (peptic ulcer disease) 07/03/2007    Past Surgical History:  Procedure Laterality Date   BIOPSY  11/26/2018   Procedure: BIOPSY;  Surgeon: Gatha Mayer, MD;  Location: [facility];  Service: Endoscopy;;   COLONOSCOPY W/ BIOPSIES     COLONOSCOPY WITH PROPOFOL N/A 11/26/2018   Procedure: COLONOSCOPY WITH PROPOFOL;  Surgeon: Gatha Mayer, MD;  Location: [facility];  Service: Endoscopy;  Laterality: N/A;   ESOPHAGOGASTRODUODENOSCOPY     ESOPHAGOGASTRODUODENOSCOPY (EGD) WITH PROPOFOL N/A 11/26/2018   Procedure: ESOPHAGOGASTRODUODENOSCOPY (EGD) WITH PROPOFOL;  Surgeon: Gatha Mayer, MD;  Location: [facility];  Service: Endoscopy;  Laterality: N/A;   HEMOSTASIS CLIP PLACEMENT  11/26/2018   Procedure: HEMOSTASIS CLIP PLACEMENT;  Surgeon: Gatha Mayer, MD;  Location: [facility];  Service: Endoscopy;;   HERNIA REPAIR Bilateral    HOT HEMOSTASIS N/A 11/26/2018   Procedure: HOT HEMOSTASIS (ARGON PLASMA COAGULATION/BICAP);  Surgeon: Gatha Mayer, MD;  Location: [facility];  Service: Endoscopy;   Laterality: N/A;   OPEN REDUCTION INTERNAL FIXATION (ORIF) DISTAL RADIAL FRACTURE Right 02/11/2018   Procedure: OPEN REDUCTION INTERNAL FIXATION (ORIF) DISTAL RADIAL FRACTURE;  Surgeon: Milly Jakob, MD;  Location: [facility];  Service: Orthopedics;  Laterality: Right;   POLYPECTOMY  11/26/2018   Procedure: POLYPECTOMY;  Surgeon: Gatha Mayer, MD;  Location: [facility];  Service: Endoscopy;;   TONSILLECTOMY         Family History  Problem Relation Age of Onset   Anxiety disorder Mother    Congestive Heart Failure Mother    Crohn's disease Mother    Colon cancer Mother 48   Arthritis Father    High blood pressure Father    Crohn's disease Father     Social History   Tobacco Use   Smoking status: Former    Types: Cigarettes    Quit date: 2014    Years since quitting: 8.9   Smokeless tobacco: Never  Vaping Use   Vaping Use: Never used  Substance Use Topics   Alcohol use: Not Currently    Comment: 2 25 oz of beer a night   Drug use: Yes    Types: Marijuana    Home Medications Prior to Admission medications   Medication Sig Start Date End Date Taking? Authorizing Provider  acetaminophen (TYLENOL) 500 MG tablet Take 500-1,000 mg by mouth every 6 (six) hours as needed (for pain).    [provider]  diclofenac Sodium (VOLTAREN) 1 % GEL Apply 2 g topically 4 (four) times daily as needed (to painful sites).    [provider]  famotidine (PEPCID) 20 MG tablet Take 1 tablet (20 mg total) by mouth 2 (two) times daily. Patient not taking: Reported on 04/26/2021 04/18/21   Carlisle Cater, PA-C  lisinopril (PRINIVIL,ZESTRIL) 10 MG tablet Take 1 tablet (10 mg total) by mouth daily. 08/21/17 06/04/21  Lacroce, Hulen Shouts, MD  methocarbamol (ROBAXIN) 500 MG tablet Take 500 mg by mouth every 8 (eight) hours as needed for muscle spasms.    [provider]  Multiple Vitamin (MULTIVITAMIN WITH MINERALS) TABS tablet Take 1 tablet by mouth daily. (May buy over the  counter) Patient taking differently: Take 1 tablet by mouth daily. 06/28/18   Connye Burkitt, NP  ondansetron (ZOFRAN ODT) 4 MG disintegrating tablet Take 1 tablet (4 mg total) by mouth every 4 (four) hours as needed for nausea or vomiting. Patient not taking: Reported on 04/26/2021 03/24/21   Charlesetta Shanks, MD  ondansetron (ZOFRAN-ODT) 4 MG disintegrating tablet Take 1 tablet (4 mg total) by mouth every 4 (four) hours as needed for nausea or vomiting. 04/29/21   Charlesetta Shanks, MD  pantoprazole (PROTONIX) 40 MG tablet Take 1 tablet (40 mg total) by mouth daily. Patient taking differently: Take 40 mg by mouth in the morning. 04/24/14   Janith Lima, MD  propranolol (INDERAL) 10 MG tablet Take 10 mg by mouth 2 (two)  times daily. 04/05/21   [provider]  QUEtiapine (SEROQUEL) 300 MG tablet Take 300 mg by mouth at bedtime.    [provider]  sucralfate (CARAFATE) 1 g tablet Take 1 g by mouth 4 (four) times daily. 04/05/21   [provider]  sucralfate (CARAFATE) 1 GM/10ML suspension Take 10 mLs (1 g total) by mouth 4 (four) times daily -  with meals and at bedtime. Patient not taking: Reported on 04/27/2021 03/24/21   Charlesetta Shanks, MD  tiZANidine (ZANAFLEX) 4 MG tablet Take 4 mg by mouth 3 (three) times daily as needed for muscle spasms. 03/05/21   [provider]  verapamil (CALAN-SR) 240 MG CR tablet Take 240 mg by mouth daily. 02/09/17   [provider]  zolpidem (AMBIEN) 5 MG tablet Take 5 mg by mouth at bedtime.    [provider]    Allergies    Nsaids, Diphenhydramine hcl, Flexeril [cyclobenzaprine], Hctz [hydrochlorothiazide], Rexulti [brexpiprazole], Seroquel [quetiapine], Gabapentin, and Hydroxyzine  Review of Systems   Review of Systems  Constitutional:  Negative for chills and fever.  HENT:  Negative for congestion and facial swelling.   Eyes:  Negative for discharge and visual disturbance.  Respiratory:  Negative for  shortness of breath.   Cardiovascular:  Negative for chest pain and palpitations.  Gastrointestinal:  Positive for abdominal pain. Negative for diarrhea and vomiting.  Musculoskeletal:  Positive for arthralgias and myalgias.  Skin:  Negative for color change and rash.  Neurological:  Negative for tremors, syncope and headaches.  Psychiatric/Behavioral:  Negative for confusion and dysphoric mood.    Physical Exam Updated Vital Signs BP (!) 147/78   Pulse 65   Temp 98.6 F (37 C) (Oral)   Resp (!) 22   SpO2 100%   Physical Exam Vitals and nursing note reviewed.  Constitutional:      Appearance: He is well-developed.  HENT:     Head: Normocephalic and atraumatic.  Eyes:     Pupils: Pupils are equal, round, and reactive to light.  Neck:     Vascular: No JVD.  Cardiovascular:     Rate and Rhythm: Normal rate and regular rhythm.     Heart sounds: No murmur heard.   No friction rub. No gallop.  Pulmonary:     Effort: No respiratory distress.     Breath sounds: No wheezing.  Abdominal:     General: There is no distension.     Tenderness: There is abdominal tenderness. There is no guarding or rebound.     Comments: Pain mostly in the suprapubic region.  Some fullness to the area.  Musculoskeletal:        General: Normal range of motion.     Cervical back: Normal range of motion and neck supple.  Skin:    Coloration: Skin is not pale.     Findings: No rash.  Neurological:     Mental Status: He is alert and oriented to person, place, and time.  Psychiatric:        Behavior: Behavior normal.    ED Results / Procedures / Treatments   Labs (all labs ordered are listed, but only abnormal results are displayed) Labs Reviewed  CBC WITH DIFFERENTIAL/PLATELET - Abnormal; Notable for the following components:      Result Value   RBC 4.17 (*)    Hemoglobin 12.9 (*)    HCT 38.3 (*)    All other components within normal limits  COMPREHENSIVE METABOLIC PANEL - Abnormal; Notable  for the following components:   Potassium 3.4 (*)    BUN 6 (*)    Calcium 8.5 (*)    Total Protein 6.3 (*)    All other components within normal limits  URINALYSIS, ROUTINE W REFLEX MICROSCOPIC - Abnormal; Notable for the following components:   Color, Urine COLORLESS (*)    Ketones, ur 15 (*)    All other components within normal limits    EKG EKG Interpretation  Date/Time:  Sunday May 01 2021 09:19:52 EST Ventricular Rate:  72 PR Interval:  179 QRS Duration: 121 QT Interval:  427 QTC Calculation: 468 R Axis:   69 Text Interpretation: Sinus rhythm Nonspecific intraventricular conduction delay No significant change since last tracing Confirmed by ,  ([id number]) on 05/01/2021 10:22:15 AM  Radiology No results found.  Procedures Procedures   Medications Ordered in ED Medications  sodium chloride 0.9 % bolus 1,000 mL (1,000 mLs Intravenous New Bag/Given 05/01/21 1003)  LORazepam (ATIVAN) tablet 0.5 mg (0.5 mg Oral Given 05/01/21 0944)  chlordiazePOXIDE (LIBRIUM) capsule 50 mg (50 mg Oral Given 05/01/21 0944)  alum & mag hydroxide-simeth (MAALOX/MYLANTA) 200-200-20 MG/5ML suspension 30 mL (30 mLs Oral Given 05/01/21 0944)  ketorolac (TORADOL) 15 MG/ML injection 15 mg (15 mg Intravenous Given 05/01/21 1001)    ED Course  I have reviewed the triage vital signs and the nursing notes.  Pertinent labs & imaging results that were available during my care of the patient were reviewed by me and considered in my medical decision making (see chart for details).    MDM Rules/Calculators/A&P                           65  yo M with a chief complaints of dehydration.  Patient tells me that he has not been able to eat and drink well for the past couple days.  Looking at his chart it looks like this is a chronic issue for him.  He feels like it is actually gotten worse.  I am worried that I can palpate his bladder on exam.  Possibly having urinary retention though not  particularly hypertensive or tachycardic.  Will obtain a bladder scan blood work bolus of IV fluids.  Patient able to urinate here without issue.  UA without obvious infection.  Blood work with no significant electrolyte abnormality no anemia.  Will discharge home.  12:17 PM:  I have discussed the diagnosis/risks/treatment options with the patient and believe the pt to be eligible for discharge home to follow-up with PCP. We also discussed returning to the ED immediately if new or worsening sx occur. We discussed the sx which are most concerning (e.g., sudden worsening pain, fever, inability to tolerate by mouth) that necessitate immediate return. Medications administered to the patient during their visit and any new prescriptions provided to the patient are listed below.  Medications given during this visit Medications  sodium chloride 0.9 % bolus 1,000 mL (1,000 mLs Intravenous New Bag/Given 05/01/21 1003)  LORazepam (ATIVAN) tablet 0.5 mg (0.5 mg Oral Given 05/01/21 0944)  chlordiazePOXIDE (LIBRIUM) capsule 50 mg (50 mg Oral Given 05/01/21 0944)  alum & mag hydroxide-simeth (MAALOX/MYLANTA) 200-200-20 MG/5ML suspension 30 mL (30 mLs Oral Given 05/01/21 0944)  ketorolac (TORADOL) 15 MG/ML injection 15 mg (15 mg Intravenous Given 05/01/21 1001)     The patient appears reasonably screen and/or stabilized for discharge and I doubt any other medical condition or other [facility] requiring further screening, evaluation, or treatment  in the ED at this time prior to discharge.   Final Clinical Impression(s) / ED Diagnoses Final diagnoses:  Weakness  Decreased appetite    Rx / DC Orders ED Discharge Orders     None        Deno Etienne, DO 05/01/21 1217

## 2021-05-03 ENCOUNTER — Emergency Department (HOSPITAL_COMMUNITY)
Admission: EM | Admit: 2021-05-03 | Discharge: 2021-05-07 | Disposition: A | Discharge status: Other Acute Inpatient Hospital | Payer: Medicaid Other | Attending: Student | Admitting: Student

## 2021-05-03 ENCOUNTER — Other Ambulatory Visit: Payer: Self-pay

## 2021-05-03 DIAGNOSIS — I10 Essential (primary) hypertension: Secondary | ICD-10-CM | POA: Insufficient documentation

## 2021-05-03 DIAGNOSIS — F0390 Unspecified dementia without behavioral disturbance: Secondary | ICD-10-CM | POA: Insufficient documentation

## 2021-05-03 DIAGNOSIS — F332 Major depressive disorder, recurrent severe without psychotic features: Secondary | ICD-10-CM | POA: Diagnosis not present

## 2021-05-03 DIAGNOSIS — Z79899 Other long term (current) drug therapy: Secondary | ICD-10-CM | POA: Insufficient documentation

## 2021-05-03 DIAGNOSIS — F32A Depression, unspecified: Secondary | ICD-10-CM | POA: Diagnosis present

## 2021-05-03 DIAGNOSIS — R45851 Suicidal ideations: Secondary | ICD-10-CM | POA: Diagnosis not present

## 2021-05-03 DIAGNOSIS — Z20822 Contact with and (suspected) exposure to covid-19: Secondary | ICD-10-CM | POA: Insufficient documentation

## 2021-05-03 DIAGNOSIS — Z87891 Personal history of nicotine dependence: Secondary | ICD-10-CM | POA: Diagnosis not present

## 2021-05-03 LAB — CBC WITH DIFFERENTIAL/PLATELET
Abs Immature Granulocytes: 0.01 10*3/uL (ref 0.00–0.07)
Basophils Absolute: 0.1 10*3/uL (ref 0.0–0.1)
Basophils Relative: 1 %
Eosinophils Absolute: 0.1 10*3/uL (ref 0.0–0.5)
Eosinophils Relative: 1 %
HCT: 39.3 % (ref 39.0–52.0)
Hemoglobin: 13.4 g/dL (ref 13.0–17.0)
Immature Granulocytes: 0 %
Lymphocytes Relative: 27 %
Lymphs Abs: 1.8 10*3/uL (ref 0.7–4.0)
MCH: 32.1 pg (ref 26.0–34.0)
MCHC: 34.1 g/dL (ref 30.0–36.0)
MCV: 94.2 fL (ref 80.0–100.0)
Monocytes Absolute: 0.7 10*3/uL (ref 0.1–1.0)
Monocytes Relative: 11 %
Neutro Abs: 3.8 10*3/uL (ref 1.7–7.7)
Neutrophils Relative %: 60 %
Platelets: 299 10*3/uL (ref 150–400)
RBC: 4.17 MIL/uL — ABNORMAL LOW (ref 4.22–5.81)
RDW: 12.4 % (ref 11.5–15.5)
WBC: 6.5 10*3/uL (ref 4.0–10.5)
nRBC: 0 % (ref 0.0–0.2)

## 2021-05-03 LAB — COMPREHENSIVE METABOLIC PANEL
ALT: 14 U/L (ref 0–44)
AST: 18 U/L (ref 15–41)
Albumin: 3.7 g/dL (ref 3.5–5.0)
Alkaline Phosphatase: 60 U/L (ref 38–126)
Anion gap: 13 (ref 5–15)
BUN: <5 mg/dL — ABNORMAL LOW (ref 8–23)
CO2: 22 mmol/L (ref 22–32)
Calcium: 9.1 mg/dL (ref 8.9–10.3)
Chloride: 97 mmol/L — ABNORMAL LOW (ref 98–111)
Creatinine, Ser: 0.67 mg/dL (ref 0.61–1.24)
GFR, Estimated: >60 mL/min (ref >60)
Glucose, Bld: 87 mg/dL (ref 70–99)
Potassium: 3.5 mmol/L (ref 3.5–5.1)
Sodium: 132 mmol/L — ABNORMAL LOW (ref 135–145)
Total Bilirubin: 0.9 mg/dL (ref 0.3–1.2)
Total Protein: 6.3 g/dL — ABNORMAL LOW (ref 6.5–8.1)

## 2021-05-03 LAB — ETHANOL: Alcohol, Ethyl (B): <10 mg/dL (ref <10)

## 2021-05-03 LAB — RAPID URINE DRUG SCREEN, HOSP PERFORMED
Amphetamines: NOT DETECTED
Barbiturates: NOT DETECTED
Benzodiazepines: POSITIVE — AB
Cocaine: NOT DETECTED
Opiates: NOT DETECTED
Tetrahydrocannabinol: NOT DETECTED

## 2021-05-03 LAB — RESP PANEL BY RT-PCR (FLU A&B, COVID) ARPGX2
Influenza A by PCR: NEGATIVE
Influenza B by PCR: NEGATIVE
SARS Coronavirus 2 by RT PCR: NEGATIVE

## 2021-05-03 MED ORDER — LORAZEPAM 1 MG PO TABS
1.0000 mg | ORAL_TABLET | Freq: Once | ORAL | Status: AC
  Administered 2021-05-03: 1 mg via ORAL

## 2021-05-03 MED ORDER — TRAZODONE HCL 50 MG PO TABS
ORAL_TABLET | ORAL | Status: AC
  Administered 2021-05-03: 50 mg
  Filled 2021-05-03: qty 1

## 2021-05-03 MED ORDER — SUCRALFATE 1 G PO TABS
1.0000 g | ORAL_TABLET | Freq: Four times a day (QID) | ORAL | Status: DC
  Administered 2021-05-04 – 2021-05-07 (×12): 1 g via ORAL
  Filled 2021-05-03 (×15): qty 1

## 2021-05-03 MED ORDER — TRAZODONE HCL 50 MG PO TABS
50.0000 mg | ORAL_TABLET | Freq: Every day | ORAL | Status: DC
  Administered 2021-05-04 – 2021-05-06 (×3): 50 mg via ORAL
  Filled 2021-05-03 (×4): qty 1

## 2021-05-03 MED ORDER — LORAZEPAM 1 MG PO TABS
1.0000 mg | ORAL_TABLET | ORAL | Status: DC | PRN
  Administered 2021-05-03 – 2021-05-07 (×9): 1 mg via ORAL
  Filled 2021-05-03 (×7): qty 1

## 2021-05-03 MED ORDER — ACETAMINOPHEN 325 MG PO TABS
650.0000 mg | ORAL_TABLET | Freq: Four times a day (QID) | ORAL | Status: DC | PRN
  Administered 2021-05-03 – 2021-05-05 (×3): 650 mg via ORAL
  Filled 2021-05-03 (×4): qty 2
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
## 2021-05-03 NOTE — ED Notes (Signed)
Pt's belongings placed in locker 14. Wanded by security. Valuables w security.

## 2021-05-03 NOTE — ED Provider Notes (Signed)
[facility] EMERGENCY DEPARTMENT Provider Note   CSN: [id number] Arrival date & time: 05/03/21  1231     History Chief Complaint  Patient presents with   Suicidal    Anthony Lamb is a 65 y.o. male.  65 year old male with history of MDD who presents to the emergency room with suicidal ideation and attempt today.  Patient was found with a knife to his wrist threatening to cut himself.  Patient does have history of ED visits with suicidal ideations.  He was last evaluated in the emergency room on 11/23.  He denies homicidal ideations, or auditory or visual hallucinations.  During the interview he refers to the fact that he has throat cancer and has been uncomfortable at home.  Patient states he lives alone in an apartment and is worried that he may lose his benefits soon.  Currently he is without other complaints.  The history is provided by the patient. No language interpreter was used.      Past Medical History:  Diagnosis Date   Alcohol abuse, in remission    Anal fissure    Anemia    Ankylosing spondylitis (HCC)    Anxiety    Arthritis    Chronic diarrhea    Chronic headaches    Chronic pain    Colitis    Colon polyps    Depression    Esophagitis    Gastric polyps    hyperplastic and fundic gland   Gastritis    GERD (gastroesophageal reflux disease)    Hypertension    IBS (irritable bowel syndrome)    Poor dentition    SIADH (syndrome of inappropriate ADH production) ([place])     Patient Active Problem List   Diagnosis Date Noted   Suicidal ideation 04/05/2021   MDD (major depressive disorder), recurrent episode, severe ([place]) 03/31/2021   Substance induced mood disorder ([place]) 03/31/2021   Family history of colon cancer 12/02/2018   Gastric polyps    Schatzki's ring    Left sided ulcerative colitis without complication ([place])    Odynophagia 11/25/2018   Dysphagia 11/25/2018   Major depressive disorder, recurrent severe without psychotic  features ([place]) 06/24/2018   IBS (irritable bowel syndrome)    Hypertension    Colitis    Adjustment disorder with mixed anxiety and depressed mood 05/11/2017   Alcohol abuse    Anxiety    Gastroesophageal reflux disease    Osteopenia determined by x-ray 08/06/2014   Stress fracture of calcaneus 08/06/2014   Vitamin D deficiency 12/18/2013   Dementia ([place]) 12/18/2013   Benign microscopic hematuria 07/06/2013   SIADH (syndrome of inappropriate ADH production) ([place]) 06/22/2013   Ankylosing spondylitis ([place]) 06/20/2013   Malnutrition of moderate degree ([place]) 06/20/2013   BPH (benign prostatic hyperplasia) 08/22/2010   History of colonic polyps 08/13/2008   Essential hypertension 07/03/2007   PUD (peptic ulcer disease) 07/03/2007    Past Surgical History:  Procedure Laterality Date   BIOPSY  11/26/2018   Procedure: BIOPSY;  Surgeon: Gatha Mayer, MD;  Location: [facility];  Service: Endoscopy;;   COLONOSCOPY W/ BIOPSIES     COLONOSCOPY WITH PROPOFOL N/A 11/26/2018   Procedure: COLONOSCOPY WITH PROPOFOL;  Surgeon: Gatha Mayer, MD;  Location: [facility];  Service: Endoscopy;  Laterality: N/A;   ESOPHAGOGASTRODUODENOSCOPY     ESOPHAGOGASTRODUODENOSCOPY (EGD) WITH PROPOFOL N/A 11/26/2018   Procedure: ESOPHAGOGASTRODUODENOSCOPY (EGD) WITH PROPOFOL;  Surgeon: Gatha Mayer, MD;  Location: [facility];  Service: Endoscopy;  Laterality: N/A;  HEMOSTASIS CLIP PLACEMENT  11/26/2018   Procedure: HEMOSTASIS CLIP PLACEMENT;  Surgeon: Gatha Mayer, MD;  Location: [facility];  Service: Endoscopy;;   HERNIA REPAIR Bilateral    HOT HEMOSTASIS N/A 11/26/2018   Procedure: HOT HEMOSTASIS (ARGON PLASMA COAGULATION/BICAP);  Surgeon: Gatha Mayer, MD;  [facility];  Service: Endoscopy;  Laterality: N/A;   OPEN REDUCTION INTERNAL FIXATION (ORIF) DISTAL RADIAL FRACTURE Right 02/11/2018   Procedure: OPEN REDUCTION INTERNAL FIXATION (ORIF) DISTAL RADIAL FRACTURE;  Surgeon: Milly Jakob, MD;  Location: [place];  Service: Orthopedics;  Laterality: Right;   POLYPECTOMY  11/26/2018   Procedure: POLYPECTOMY;  Surgeon: Gatha Mayer, MD;  [facility]: [facility];  Service: Endoscopy;;   TONSILLECTOMY         Family History  Problem Relation Age of Onset   Anxiety disorder Mother    Congestive Heart Failure Mother    Crohn's disease Mother    Colon cancer Mother 33   Arthritis Father    High blood pressure Father    Crohn's disease Father     Social History   Tobacco Use   Smoking status: Former    Types: Cigarettes    Quit date: 2014    Years since quitting: 8.9   Smokeless tobacco: Never  Vaping Use   Vaping Use: Never used  Substance Use Topics   Alcohol use: Not Currently    Comment: 2 25 oz of beer a night   Drug use: Yes    Types: Marijuana    Home Medications Prior to Admission medications   Medication Sig Start Date End Date Taking? Authorizing Provider  acetaminophen (TYLENOL) 500 MG tablet Take 500-1,000 mg by mouth every 6 (six) hours as needed (for pain).    [provider]  chlordiazePOXIDE (LIBRIUM) 25 MG capsule 68m PO TID x 1D, then 25-574mPO BID X 1D, then 25-509mO QD X 1D 05/01/21   FloDeno EtienneO  diclofenac Sodium (VOLTAREN) 1 % GEL Apply 2 g topically 4 (four) times daily as needed (to painful sites).    [provider]  famotidine (PEPCID) 20 MG tablet Take 1 tablet (20 mg total) by mouth 2 (two) times daily. Patient not taking: Reported on 04/26/2021 04/18/21   GeiCarlisle CaterA-C  lisinopril (PRINIVIL,ZESTRIL) 10 MG tablet Take 1 tablet (10 mg total) by mouth daily. 08/21/17 06/04/21  Lacroce, SamHulen ShoutsD  methocarbamol (ROBAXIN) 500 MG tablet Take 500 mg by mouth every 8 (eight) hours as needed for muscle spasms.    [provider]  Multiple Vitamin (MULTIVITAMIN WITH MINERALS) TABS tablet Take 1 tablet by mouth daily. (May buy over the counter) Patient taking differently: Take 1 tablet by mouth  daily. 06/28/18   SykConnye BurkittP  ondansetron (ZOFRAN ODT) 4 MG disintegrating tablet Take 1 tablet (4 mg total) by mouth every 4 (four) hours as needed for nausea or vomiting. Patient not taking: Reported on 04/26/2021 03/24/21   PfeCharlesetta ShanksD  ondansetron (ZOFRAN-ODT) 4 MG disintegrating tablet Take 1 tablet (4 mg total) by mouth every 4 (four) hours as needed for nausea or vomiting. 04/29/21   PfeCharlesetta ShanksD  pantoprazole (PROTONIX) 40 MG tablet Take 1 tablet (40 mg total) by mouth daily. Patient taking differently: Take 40 mg by mouth in the morning. 04/24/14   JonJanith LimaD  propranolol (INDERAL) 10 MG tablet Take 10 mg by mouth 2 (two) times daily. 04/05/21   [provider]  QUEtiapine (SEROQUEL)  300 MG tablet Take 300 mg by mouth at bedtime.    [provider]  sucralfate (CARAFATE) 1 g tablet Take 1 g by mouth 4 (four) times daily. 04/05/21   [provider]  sucralfate (CARAFATE) 1 GM/10ML suspension Take 10 mLs (1 g total) by mouth 4 (four) times daily -  with meals and at bedtime. Patient not taking: Reported on 04/27/2021 03/24/21   Charlesetta Shanks, MD  tiZANidine (ZANAFLEX) 4 MG tablet Take 4 mg by mouth 3 (three) times daily as needed for muscle spasms. 03/05/21   [provider]  verapamil (CALAN-SR) 240 MG CR tablet Take 240 mg by mouth daily. 02/09/17   [provider]  zolpidem (AMBIEN) 5 MG tablet Take 5 mg by mouth at bedtime.    [provider]    Allergies    Nsaids, Diphenhydramine hcl, Flexeril [cyclobenzaprine], Hctz [hydrochlorothiazide], Rexulti [brexpiprazole], Seroquel [quetiapine], Gabapentin, and Hydroxyzine  Review of Systems   Review of Systems  Neurological:  Negative for speech difficulty.  Psychiatric/Behavioral:  Positive for suicidal ideas. Negative for agitation, behavioral problems, confusion and hallucinations.    Physical Exam Updated Vital Signs BP 129/66 (BP Location: Left Arm)    Pulse 71   Temp 99.2 F (37.3 C) (Oral)   Resp 16   SpO2 99%   Physical Exam Vitals and nursing note reviewed.  Constitutional:      General: He is not in acute distress.    Appearance: Normal appearance. He is not ill-appearing.     Comments: Disheveled appearing  HENT:     Head: Normocephalic and atraumatic.     Nose: Nose normal.  Eyes:     General: No scleral icterus.    Extraocular Movements: Extraocular movements intact.     Conjunctiva/sclera: Conjunctivae normal.  Cardiovascular:     Rate and Rhythm: Normal rate and regular rhythm.     Pulses: Normal pulses.     Heart sounds: Normal heart sounds.  Pulmonary:     Effort: Pulmonary effort is normal. No respiratory distress.     Breath sounds: Normal breath sounds. No wheezing or rales.  Musculoskeletal:        General: Normal range of motion.     Cervical back: Normal range of motion.  Skin:    General: Skin is warm and dry.  Neurological:     General: No focal deficit present.     Mental Status: He is alert. Mental status is at baseline.    ED Results / Procedures / Treatments   Labs (all labs ordered are listed, but only abnormal results are displayed) Labs Reviewed  COMPREHENSIVE METABOLIC PANEL - Abnormal; Notable for the following components:      Result Value   Sodium 132 (*)    Chloride 97 (*)    BUN <5 (*)    Total Protein 6.3 (*)    All other components within normal limits  CBC WITH DIFFERENTIAL/PLATELET - Abnormal; Notable for the following components:   RBC 4.17 (*)    All other components within normal limits  RESP PANEL BY RT-PCR (FLU A&B, COVID) ARPGX2  ETHANOL  RAPID URINE DRUG SCREEN, HOSP PERFORMED    EKG None  Radiology No results found.  Procedures Procedures   Medications Ordered in ED Medications  LORazepam (ATIVAN) tablet 1 mg (1 mg Oral Given 05/03/21 1450)    ED Course  I have reviewed the triage vital signs and the nursing notes.  Pertinent labs & imaging  results that  were available during my care of the patient were reviewed by me and considered in my medical decision making (see chart for details).    MDM Rules/Calculators/A&P                           Mr. Wilmer is a 65 year old male presenting to the emergency room with suicidal ideation and found with a knife to his wrist earlier today.  He did not follow through with his suicide attempt.  He denies homicidal ideations, or auditory, or visual hallucinations.  Initial work-up with mild hyponatremia at 132, chloride of 97, CBC with RBC of 4.17 otherwise unremarkable, respiratory panel negative, ethanol level within normal limits.  EKG with normal sinus rhythm.  Patient is medically cleared and awaiting TTS consult.  Patient evaluated by psych and recommended inpatient treatment.  Patient will be a psych hold.  Psych team working on inpatient placement.  Final Clinical Impression(s) / ED Diagnoses Final diagnoses:  None    Rx / DC Orders ED Discharge Orders     None        Evlyn Courier, PA-C 05/03/21 2323    Teressa Lower, MD 05/05/21 [phone]

## 2021-05-03 NOTE — Progress Notes (Signed)
Inpatient Behavioral Health Placement  Pt meets inpatient criteria per [facility] . There is no appropriate beds at [facility] per [facility] Wynonia Hazard, RN. Referral was sent to the following facilities;    Destination Service Provider Address Phone Fax  [facility]  [address] [place] [address] [phone] [facility]  [address] [place] [address] [facility]  [place], [place] [address] [phone]  [facility]  [place], [place] [address] [phone]  [facility]  [place] [address] [place] [address] [phone] [facility]  [address] [place] [address] [phone]  [facility]  [address], [place] [address] [phone] [place]  [address] [place] [address] [phone] [facility]  [address], [place] [address] [phone] [place]  [address] [place] [address] [phone] [facility]  [address], [place] [address] [phone] [facility]  [address], [place] [address] [place]  [facility]  [address] [place] [address] [phone]  [facility]  [address] [place], [place] [address] [place]  [facility]  [address] [place] [address] [phone] [place]  [address] [place] [address] [phone]  [place],  [place] [address] [phone]    Situation ongoing,  CSW will follow up.   Benjaman Kindler, MSW, [facility] 05/03/2021  @ 10:49 PM

## 2021-05-03 NOTE — ED Provider Notes (Signed)
Emergency Medicine Provider Triage Evaluation Note  Anthony Lamb , a 65 y.o. male  was evaluated in triage.  Patient under IVC.  Report includes patient has severe anxiety, lives alone and is unable to take care of himself.  He was found by a friend earlier today in bed with a knife threatening to cut his wrist to kill himself.  Patient reports he has a history of throat cancer and is unable to eat, and he has not been able to have a bowel movement 2 weeks.  He reports daily alcohol use, 2-3 tall boys daily.  Last drink was 2 days ago.  No smoking history, no drug use.  States that he is intermittently having visual hallucinations of animals that he finds are threatening, and intermittently hears strange sounds.  Review of Systems  Positive: Suicidal ideation, anxiety, visual and auditory hallucinations Negative: Homicidal ideation  Physical Exam  BP 129/66 (BP Location: Left Arm)   Pulse 71   Temp 99.2 F (37.3 C) (Oral)   Resp 16   SpO2 99%  Gen:   Awake, no distress   Resp:  Normal effort  MSK:   Moves extremities without difficulty  Other:    Medical Decision Making  Medically screening exam initiated at 2:26 PM.  Appropriate orders placed.  GIORGI DEBRUIN was informed that the remainder of the evaluation will be completed by another provider, this initial triage assessment does not replace that evaluation, and the importance of remaining in the ED until their evaluation is complete.     Estill Cotta 05/03/21 1433    Carmin Muskrat, MD 05/07/21 1426

## 2021-05-03 NOTE — ED Notes (Addendum)
Pt is cooperative at this time. Safety measures maintained.

## 2021-05-03 NOTE — ED Notes (Signed)
Pt stated " He will kill himself if we let him go home." Also stated " I'm going to jump out the bed, so I can break my hip on purpose. "  MD made aware.

## 2021-05-03 NOTE — Progress Notes (Signed)
CSW sent secure chat to [place], RN to review for [facility]. Pt meets inpatient criteria per Trinna Post. PA. CSW will assist and follow with inpatient behavioral health placement.   Benjaman Kindler, MSW, [facility] 05/03/2021 8:35 PM

## 2021-05-04 ENCOUNTER — Encounter (HOSPITAL_COMMUNITY): Payer: Self-pay

## 2021-05-04 MED ORDER — VERAPAMIL HCL ER 240 MG PO TBCR
240.0000 mg | EXTENDED_RELEASE_TABLET | Freq: Every day | ORAL | Status: DC
  Administered 2021-05-04 – 2021-05-06 (×3): 240 mg via ORAL
  Filled 2021-05-04 (×4): qty 1

## 2021-05-04 MED ORDER — PANTOPRAZOLE SODIUM 40 MG PO TBEC
40.0000 mg | DELAYED_RELEASE_TABLET | Freq: Every morning | ORAL | Status: DC
  Administered 2021-05-04 – 2021-05-07 (×4): 40 mg via ORAL
  Filled 2021-05-04 (×4): qty 1

## 2021-05-04 MED ORDER — DOCUSATE SODIUM 100 MG PO CAPS
100.0000 mg | ORAL_CAPSULE | Freq: Once | ORAL | Status: AC
  Administered 2021-05-04: 100 mg via ORAL
  Filled 2021-05-04: qty 1

## 2021-05-04 MED ORDER — QUETIAPINE FUMARATE 200 MG PO TABS
300.0000 mg | ORAL_TABLET | Freq: Every day | ORAL | Status: DC
  Administered 2021-05-04 – 2021-05-06 (×4): 300 mg via ORAL
  Filled 2021-05-04 (×3): qty 1
  Filled 2021-05-04: qty 3
  Filled 2021-05-04: qty 1

## 2021-05-04 MED ORDER — LISINOPRIL 10 MG PO TABS
10.0000 mg | ORAL_TABLET | Freq: Every day | ORAL | Status: DC
  Administered 2021-05-04 – 2021-05-06 (×3): 10 mg via ORAL
  Filled 2021-05-04 (×3): qty 1

## 2021-05-04 MED ORDER — PROPRANOLOL HCL 10 MG PO TABS
10.0000 mg | ORAL_TABLET | Freq: Two times a day (BID) | ORAL | Status: DC
  Administered 2021-05-04 – 2021-05-06 (×7): 10 mg via ORAL
  Filled 2021-05-04 (×9): qty 1

## 2021-05-04 MED ORDER — TIZANIDINE HCL 4 MG PO TABS
4.0000 mg | ORAL_TABLET | Freq: Three times a day (TID) | ORAL | Status: DC | PRN
  Administered 2021-05-04 – 2021-05-06 (×3): 4 mg via ORAL
  Filled 2021-05-04 (×3): qty 1

## 2021-05-04 MED ORDER — ZOLPIDEM TARTRATE 5 MG PO TABS
5.0000 mg | ORAL_TABLET | Freq: Every day | ORAL | Status: DC
  Administered 2021-05-04 – 2021-05-06 (×4): 5 mg via ORAL
  Filled 2021-05-04 (×4): qty 1

## 2021-05-04 MED ORDER — LORAZEPAM 1 MG PO TABS
1.0000 mg | ORAL_TABLET | Freq: Three times a day (TID) | ORAL | Status: DC
  Administered 2021-05-04 – 2021-05-06 (×8): 1 mg via ORAL
  Filled 2021-05-04 (×9): qty 1

## 2021-05-04 NOTE — ED Notes (Signed)
Breakfast order placed ?

## 2021-05-04 NOTE — ED Notes (Signed)
Patient repeatedly attempted to get out of bed after being told he is a high fall risk and must lie down. Patient responded with "I'm going to get out and fall on my hip so I don't have to go home and they'll send me to a nursing facility. Patient was escorted to the bathroom by staff afterward and attempted to fall down twice on the way there. Patient is currently in bed and has been given his nightly medication by nursing staff. No requests from patient at this time.

## 2021-05-04 NOTE — ED Notes (Signed)
Pt ambulated self to bathroom w/o incident.

## 2021-05-04 NOTE — ED Notes (Signed)
Pt ambulatory to the bathroom and back to the stretcher. Pt covered with blankets. Pt covered his face so he can continue to rest. Pt denies any complaints. No acute changes noted. Will continue to monitor.

## 2021-05-04 NOTE — Consult Note (Signed)
Telepsych Consultation   Reason for Consult:  Suicidal ideation Referring Physician:  Estill Cotta Location of Patient: [facility] ED Location of Provider: Other: [facility]  Patient Identification: Anthony Lamb MRN:  [id number] Principal Diagnosis: Major depressive disorder, recurrent severe without psychotic features ([place]) Diagnosis:  Principal Problem:   Major depressive disorder, recurrent severe without psychotic features ([place])   Total Time spent with patient: 30 minutes  Subjective:   Anthony Lamb is a 65 y.o. male patient admitted [facility] ED after presenting via [facility] police under IVC petition by a friend who found patient in bed with a knife to his wrist and threatening to kill himself  HPI:  Anthony Lamb, 65 y.o., male patient seen via tele health by this provider, consulted with Dr. Hampton Abbot; and chart reviewed on 05/04/21.  On evaluation Anthony Lamb reports he was brought to the hospital because he was found with a knife and was going to cut his wrist.  Patient reports he continues to be suicidal.  Patient reports that he has counselor, pain, financial troubles, and he does not want to live anymore.  "I want to kill myself.  I got too many health problems, I'm feeling overwhelmed with everything else that is going on.  I am gonna kill myself.   "I'm just held here under a 72-hour IVC.  I got too many health problems and financial problems.  I don't want to live no more.  I know I have said it in the past but I mean at this time."  Patient denies homicidal ideation.  He reports he lives alone and has no support.  Patient also denies outpatient psychiatric services.   During evaluation Anthony Lamb is sitting up right in bed in no acute distress.  He is alert, oriented x 4, calm and cooperative.  His mood is depressed with congruent affect.  He does not appear to be responding to internal/external stimuli or delusional thoughts.  Patient denies homicidal  ideation, psychosis, and paranoia.  He continues to endorse suicidal ideation with plan and intent.  Will continue to recommend inpatient psychiatric treatment  Past Psychiatric History: See below  Risk to Self:  Yes Risk to Others:  Denies Prior Inpatient Therapy:  Yes Prior Outpatient Therapy:  Yes  Past Medical History:  Past Medical History:  Diagnosis Date   Alcohol abuse, in remission    Anal fissure    Anemia    Ankylosing spondylitis (HCC)    Anxiety    Arthritis    Chronic diarrhea    Chronic headaches    Chronic pain    Colitis    Colon polyps    Depression    Esophagitis    Gastric polyps    hyperplastic and fundic gland   Gastritis    GERD (gastroesophageal reflux disease)    Hypertension    IBS (irritable bowel syndrome)    Poor dentition    SIADH (syndrome of inappropriate ADH production) ([place])     Past Surgical History:  Procedure Laterality Date   BIOPSY  11/26/2018   Procedure: BIOPSY;  Surgeon: Gatha Mayer, MD;  Location: [facility];  Service: Endoscopy;;   COLONOSCOPY W/ BIOPSIES     COLONOSCOPY WITH PROPOFOL N/A 11/26/2018   Procedure: COLONOSCOPY WITH PROPOFOL;  Surgeon: Gatha Mayer, MD;  Location: [facility];  Service: Endoscopy;  Laterality: N/A;   ESOPHAGOGASTRODUODENOSCOPY     ESOPHAGOGASTRODUODENOSCOPY (EGD) WITH PROPOFOL N/A 11/26/2018   Procedure: ESOPHAGOGASTRODUODENOSCOPY (  EGD) WITH PROPOFOL;  Surgeon: Gatha Mayer, MD;  Location: [facility];  Service: Endoscopy;  Laterality: N/A;   HEMOSTASIS CLIP PLACEMENT  11/26/2018   Procedure: HEMOSTASIS CLIP PLACEMENT;  Surgeon: Gatha Mayer, MD;  Location: [facility];  Service: Endoscopy;;   HERNIA REPAIR Bilateral    HOT HEMOSTASIS N/A 11/26/2018   Procedure: HOT HEMOSTASIS (ARGON PLASMA COAGULATION/BICAP);  Surgeon: Gatha Mayer, MD;  [facility];  Service: Endoscopy;  Laterality: N/A;   OPEN REDUCTION INTERNAL FIXATION (ORIF) DISTAL RADIAL FRACTURE Right 02/11/2018    Procedure: OPEN REDUCTION INTERNAL FIXATION (ORIF) DISTAL RADIAL FRACTURE;  Surgeon: Milly Jakob, MD;  Location: [place];  Service: Orthopedics;  Laterality: Right;   POLYPECTOMY  11/26/2018   Procedure: POLYPECTOMY;  Surgeon: Gatha Mayer, MD;  Location[facility];  Service: Endoscopy;;   TONSILLECTOMY     Family History:  Family History  Problem Relation Age of Onset   Anxiety disorder Mother    Congestive Heart Failure Mother    Crohn's disease Mother    Colon cancer Mother 63   Arthritis Father    High blood pressure Father    Crohn's disease Father    Family Psychiatric  History: None noted Social History:  Social History   Substance and Sexual Activity  Alcohol Use Not Currently   Comment: 2 25 oz of beer a night     Social History   Substance and Sexual Activity  Drug Use Yes   Types: Marijuana    Social History   Socioeconomic History   Marital status: Single    Spouse name: Not on file   Number of children: 0   Years of education: Not on file   Highest education level: Not on file  Occupational History   Not on file  Tobacco Use   Smoking status: Former    Types: Cigarettes    Quit date: 2014    Years since quitting: 8.9   Smokeless tobacco: Never  Vaping Use   Vaping Use: Never used  Substance and Sexual Activity   Alcohol use: Not Currently    Comment: 2 25 oz of beer a night   Drug use: Yes    Types: Marijuana   Sexual activity: Not Currently  Other Topics Concern   Not on file  Social History Narrative   HSG. Long - term disability - unable to work. Lived with his mother in her house - she died 2023/05/14 -    Lives in apartment   Has case worker   Social Determinants of Radio broadcast assistant Strain: Not on file  Food Insecurity: Not on file  Transportation Needs: Not on file  Physical Activity: Not on file  Stress: Not on file  Social Connections: Not on file   Additional Social History:    Allergies:   Allergies   Allergen Reactions   Nsaids Other (See Comments)    GI upset- history of peptic ulcers!!   Diphenhydramine Hcl Other (See Comments)    Restlessness   Flexeril [Cyclobenzaprine] Other (See Comments)    Restlessness   Hctz [Hydrochlorothiazide] Other (See Comments)    Caused to lose sodium when taking with Lisinopril   Rexulti [Brexpiprazole] Other (See Comments)    "Made me not feel right- restless"   Seroquel [Quetiapine] Other (See Comments)    Restless legs and makes the patient sweat- also does not help patient's insomnia. Pt takes this at home   Gabapentin Diarrhea   Hydroxyzine  Other (See Comments)    Restlessness    Labs:  Results for orders placed or performed during the hospital encounter of 05/03/21 (from the past 48 hour(s))  Resp Panel by RT-PCR (Flu A&B, Covid) Nasopharyngeal Swab     Status: None   Collection Time: 05/03/21  2:14 PM   Specimen: Nasopharyngeal Swab; Nasopharyngeal(NP) swabs in vial transport medium  Result Value Ref Range   SARS Coronavirus 2 by RT PCR NEGATIVE NEGATIVE    Comment: (NOTE) SARS-CoV-2 target nucleic acids are NOT DETECTED.  The SARS-CoV-2 RNA is generally detectable in upper respiratory specimens during the acute phase of infection. The lowest concentration of SARS-CoV-2 viral copies this assay can detect is 138 copies/mL. A negative result does not preclude SARS-Cov-2 infection and should not be used as the sole basis for treatment or other patient management decisions. A negative result may occur with  improper specimen collection/handling, submission of specimen other than nasopharyngeal swab, presence of viral mutation(s) within the areas targeted by this assay, and inadequate number of viral copies(<138 copies/mL). A negative result must be combined with clinical observations, patient history, and epidemiological information. The expected result is Negative.  Fact Sheet for Patients:   EntrepreneurPulse.com.au  Fact Sheet for Healthcare Providers:  IncredibleEmployment.be  This test is no t yet approved or cleared by the [place] FDA and  has been authorized for detection and/or diagnosis of SARS-CoV-2 by FDA under an Emergency Use Authorization (EUA). This EUA will remain  in effect (meaning this test can be used) for the duration of the COVID-19 declaration under Section 564(b)(1) of the Act, 21 U.S.C.section 360bbb-3(b)(1), unless the authorization is terminated  or revoked sooner.       Influenza A by PCR NEGATIVE NEGATIVE   Influenza B by PCR NEGATIVE NEGATIVE    Comment: (NOTE) The Xpert Xpress SARS-CoV-2/FLU/RSV plus assay is intended as an aid in the diagnosis of influenza from Nasopharyngeal swab specimens and should not be used as a sole basis for treatment. Nasal washings and aspirates are unacceptable for Xpert Xpress SARS-CoV-2/FLU/RSV testing.  Fact Sheet for Patients: EntrepreneurPulse.com.au  Fact Sheet for Healthcare Providers: IncredibleEmployment.be  This test is not yet approved or cleared by the [place] FDA and has been authorized for detection and/or diagnosis of SARS-CoV-2 by FDA under an Emergency Use Authorization (EUA). This EUA will remain in effect (meaning this test can be used) for the duration of the COVID-19 declaration under Section 564(b)(1) of the Act, 21 U.S.C. section 360bbb-3(b)(1), unless the authorization is terminated or revoked.  Performed at [facility], [place] [address] [place], [place] [address]   Comprehensive metabolic panel     Status: Abnormal   Collection Time: 05/03/21  2:14 PM  Result Value Ref Range   Sodium 132 (L) 135 - 145 mmol/L   Potassium 3.5 3.5 - 5.1 mmol/L   Chloride 97 (L) 98 - 111 mmol/L   CO2 22 22 - 32 mmol/L   Glucose, Bld 87 70 - 99 mg/dL    Comment: Glucose reference range applies only to  samples taken after fasting for at least 8 hours.   BUN <5 (L) 8 - 23 mg/dL   Creatinine, Ser 0.67 0.61 - 1.24 mg/dL   Calcium 9.1 8.9 - 10.3 mg/dL   Total Protein 6.3 (L) 6.5 - 8.1 g/dL   Albumin 3.7 3.5 - 5.0 g/dL   AST 18 15 - 41 U/L   ALT 14 0 - 44 U/L   Alkaline  Phosphatase 60 38 - 126 U/L   Total Bilirubin 0.9 0.3 - 1.2 mg/dL   GFR, Estimated >60 >60 mL/min    Comment: (NOTE) Calculated using the CKD-EPI Creatinine Equation (2021)    Anion gap 13 5 - 15    Comment: Performed at [facility] [address], [place], [place] [address]  Ethanol     Status: None   Collection Time: 05/03/21  2:14 PM  Result Value Ref Range   Alcohol, Ethyl (B) <10 <10 mg/dL    Comment: (NOTE) Lowest detectable limit for serum alcohol is 10 mg/dL.  For medical purposes only. Performed at [facility], [place] [address], [place], [place] [address]   CBC with Diff     Status: Abnormal   Collection Time: 05/03/21  2:14 PM  Result Value Ref Range   WBC 6.5 4.0 - 10.5 K/uL   RBC 4.17 (L) 4.22 - 5.81 MIL/uL   Hemoglobin 13.4 13.0 - 17.0 g/dL   HCT 39.3 39.0 - 52.0 %   MCV 94.2 80.0 - 100.0 fL   MCH 32.1 26.0 - 34.0 pg   MCHC 34.1 30.0 - 36.0 g/dL   RDW 12.4 11.5 - 15.5 %   Platelets 299 150 - 400 K/uL   nRBC 0.0 0.0 - 0.2 %   Neutrophils Relative % 60 %   Neutro Abs 3.8 1.7 - 7.7 K/uL   Lymphocytes Relative 27 %   Lymphs Abs 1.8 0.7 - 4.0 K/uL   Monocytes Relative 11 %   Monocytes Absolute 0.7 0.1 - 1.0 K/uL   Eosinophils Relative 1 %   Eosinophils Absolute 0.1 0.0 - 0.5 K/uL   Basophils Relative 1 %   Basophils Absolute 0.1 0.0 - 0.1 K/uL   Immature Granulocytes 0 %   Abs Immature Granulocytes 0.01 0.00 - 0.07 K/uL    Comment: Performed at [facility] [address] [place], [place] [address]  Urine rapid drug screen (hosp performed)     Status: Abnormal   Collection Time: 05/03/21  7:40 PM  Result Value Ref Range   Opiates NONE DETECTED NONE DETECTED   Cocaine  NONE DETECTED NONE DETECTED   Benzodiazepines POSITIVE (A) NONE DETECTED   Amphetamines NONE DETECTED NONE DETECTED   Tetrahydrocannabinol NONE DETECTED NONE DETECTED   Barbiturates NONE DETECTED NONE DETECTED    Comment: (NOTE) DRUG SCREEN FOR MEDICAL PURPOSES ONLY.  IF CONFIRMATION IS NEEDED FOR ANY PURPOSE, NOTIFY LAB WITHIN 5 DAYS.  LOWEST DETECTABLE LIMITS FOR URINE DRUG SCREEN Drug Class                     Cutoff (ng/mL) Amphetamine and metabolites    1000 Barbiturate and metabolites    200 Benzodiazepine                 127 Tricyclics and metabolites     300 Opiates and metabolites        300 Cocaine and metabolites        300 THC                            50 Performed at [facility], [place] [address], [place], [place] [address]     Medications:  Current Facility-Administered Medications  Medication Dose Route Frequency Provider Last Rate Last Admin   acetaminophen (TYLENOL) tablet 650 mg  650 mg Oral Q6H PRN Evlyn Courier, PA-C  650 mg at 05/04/21 0926   lisinopril (ZESTRIL) tablet 10 mg  10 mg Oral Daily Quintella Reichert, MD   10 mg at 05/04/21 1610   LORazepam (ATIVAN) tablet 1 mg  1 mg Oral Q4H PRN Evlyn Courier, PA-C   1 mg at 05/04/21 0745   LORazepam (ATIVAN) tablet 1 mg  1 mg Oral TID Quintella Reichert, MD       pantoprazole (PROTONIX) EC tablet 40 mg  40 mg Oral q AM Quintella Reichert, MD   40 mg at 05/04/21 0704   propranolol (INDERAL) tablet 10 mg  10 mg Oral BID Quintella Reichert, MD   10 mg at 05/04/21 9604   QUEtiapine (SEROQUEL) tablet 300 mg  300 mg Oral Willene Hatchet, MD   300 mg at 05/04/21 0048   sucralfate (CARAFATE) tablet 1 g  1 g Oral Q6H Ali, Amjad, PA-C   1 g at 05/04/21 5409   tiZANidine (ZANAFLEX) tablet 4 mg  4 mg Oral TID PRN Quintella Reichert, MD   4 mg at 05/04/21 0048   traZODone (DESYREL) tablet 50 mg  50 mg Oral QHS Kommor, [place], MD   50 mg at 05/03/21 2117   verapamil (CALAN-SR) CR tablet 240 mg  240 mg Oral Daily Quintella Reichert, MD    240 mg at 05/04/21 8119   zolpidem (AMBIEN) tablet 5 mg  5 mg Oral Willene Hatchet, MD   5 mg at 05/04/21 1478   Current Outpatient Medications  Medication Sig Dispense Refill   acetaminophen (TYLENOL) 500 MG tablet Take 500-1,000 mg by mouth every 6 (six) hours as needed (for pain).     lisinopril (PRINIVIL,ZESTRIL) 10 MG tablet Take 1 tablet (10 mg total) by mouth daily. 30 tablet 0   LORazepam (ATIVAN) 1 MG tablet Take 1 mg by mouth 3 (three) times daily.     Multiple Vitamin (MULTIVITAMIN WITH MINERALS) TABS tablet Take 1 tablet by mouth daily. (May buy over the counter) (Patient taking differently: Take 1 tablet by mouth daily.) 1 tablet 0   pantoprazole (PROTONIX) 40 MG tablet Take 1 tablet (40 mg total) by mouth daily. (Patient taking differently: Take 40 mg by mouth in the morning.) 30 tablet 11   propranolol (INDERAL) 10 MG tablet Take 10 mg by mouth 2 (two) times daily.     QUEtiapine (SEROQUEL) 300 MG tablet Take 300 mg by mouth at bedtime.     sucralfate (CARAFATE) 1 g tablet Take 1 g by mouth 4 (four) times daily.     tiZANidine (ZANAFLEX) 4 MG tablet Take 4 mg by mouth 3 (three) times daily as needed for muscle spasms.     verapamil (CALAN-SR) 240 MG CR tablet Take 240 mg by mouth daily.  2   zolpidem (AMBIEN) 5 MG tablet Take 5 mg by mouth at bedtime.     chlordiazePOXIDE (LIBRIUM) 25 MG capsule 82m PO TID x 1D, then 25-568mPO BID X 1D, then 25-5027mO QD X 1D (Patient not taking: Reported on 05/03/2021) 10 capsule 0   diclofenac Sodium (VOLTAREN) 1 % GEL Apply 2 g topically 4 (four) times daily as needed (to painful sites). (Patient not taking: Reported on 05/03/2021)     famotidine (PEPCID) 20 MG tablet Take 1 tablet (20 mg total) by mouth 2 (two) times daily. (Patient not taking: Reported on 04/26/2021) 30 tablet 0   ondansetron (ZOFRAN ODT) 4 MG disintegrating tablet Take 1 tablet (4 mg total) by mouth every 4 (four) hours as  needed for nausea or vomiting. (Patient not  taking: Reported on 05/03/2021) 20 tablet 0   ondansetron (ZOFRAN-ODT) 4 MG disintegrating tablet Take 1 tablet (4 mg total) by mouth every 4 (four) hours as needed for nausea or vomiting. (Patient not taking: Reported on 05/03/2021) 20 tablet 0   sucralfate (CARAFATE) 1 GM/10ML suspension Take 10 mLs (1 g total) by mouth 4 (four) times daily -  with meals and at bedtime. (Patient not taking: Reported on 05/03/2021) 420 mL 0    Musculoskeletal: Strength & Muscle Tone:  Unable to assess via tele health [place]:  Did not see patient ambulate Patient leans: N/A  Psychiatric Specialty Exam:  Presentation  General Appearance: Casual  Eye Contact:Good  Speech:Clear and Coherent  Speech Volume:Normal  Handedness:Right   Mood and Affect  Mood:Euthymic  Affect:Congruent; Appropriate   Thought Process  Thought Processes:Coherent; Goal Directed  Descriptions of Associations:Intact  Orientation:Full (Time, Place and Person)  Thought Content:Logical  History of Schizophrenia/Schizoaffective disorder:No  Duration of Psychotic Symptoms:Less than six months  Hallucinations:No data recorded Ideas of Reference:None  Suicidal Thoughts:No data recorded Homicidal Thoughts:No data recorded  Sensorium  Memory:Immediate Good; Recent Good; Remote Good  Judgment:Good  Insight:Good   Executive Functions  Concentration:Good  Attention Span:Good  [place]  Language:Good   Psychomotor Activity  Psychomotor Activity:No data recorded  Assets  Assets:Communication Skills; Desire for Improvement; Financial Resources/Insurance; Housing; Leisure Time; Resilience   Sleep  Sleep:No data recorded   Physical Exam: Physical Exam Vitals and nursing note reviewed. Exam conducted with a chaperone present.  Constitutional:      Appearance: Normal appearance.  Cardiovascular:     Rate and Rhythm: Normal rate.  Pulmonary:     Effort:  Pulmonary effort is normal.  Neurological:     Mental Status: He is alert.  Psychiatric:        Attention and Perception: Attention and perception normal. He does not perceive visual hallucinations.        Mood and Affect: Affect normal. Mood is anxious and depressed.        Speech: Speech normal.        Behavior: Behavior normal. Behavior is cooperative.        Thought Content: Thought content is paranoid. Thought content includes suicidal ideation.        Cognition and Memory: Cognition and memory normal.        Judgment: Judgment is impulsive.   Review of Systems  Constitutional:  Negative for chills.  HENT:  Negative for congestion.   Eyes:  Negative for blurred vision.  Respiratory:  Negative for cough.   Cardiovascular:  Negative for chest pain and palpitations.  Gastrointestinal: Negative.   Genitourinary:        Reporting problems with urinating  Musculoskeletal:  Positive for joint pain and myalgias.  Neurological:  Negative for dizziness and headaches.  Psychiatric/Behavioral:  Positive for depression and suicidal ideas. Negative for hallucinations and substance abuse. The patient is nervous/anxious.   Blood pressure 118/84, pulse 71, temperature 98.7 F (37.1 C), temperature source Oral, resp. rate 16, SpO2 93 %. There is no height or weight on file to calculate BMI.  Treatment Plan Summary: Daily contact with patient to assess and evaluate symptoms and progress in treatment, Medication management, and Plan Inpatient psychiatric treatment  Disposition: Recommend psychiatric Inpatient admission when medically cleared.  This service was provided via telemedicine using a 2-way, interactive audio and video technology.  Names of all persons  participating in this telemedicine service and their role in this encounter. Name: Earleen [place] Role: NP  Name: Dr. Hampton Abbot Role: Psychiatrist  Name: Anthony Lamb Role: Patient  Name:  Role:    Secure message sent to  patients' nurse Elsie Lincoln, RN and social work informing:  Psychiatric consult complete.  Recommended for inpatient psychiatric treatment. Patient unable to contract for safety.  If no appropriate bed at [facility] fax to surrounding facilities for appropriate bed; may be appropriate for [facility].  Caroleen Stoermer, NP 05/04/2021 10:23 AM

## 2021-05-04 NOTE — Progress Notes (Signed)
Patient has been denied by [facility] due to medical reasons. CSW will continue to seek appropriate [facility] inpatient placement.    Mariea Clonts, MSW, LCSW-A  1:35 PM 05/04/2021

## 2021-05-04 NOTE — Progress Notes (Signed)
LCSW Note   05/04/2021    7:45 AM   Type of Contact and Topic: Bed Placement   Patient is under review at [facility], requested documents sent for review.    Signed:  Durenda Hurt, MSW, LCSWA, LCAS 05/04/2021 7:45 AM

## 2021-05-04 NOTE — ED Notes (Signed)
Pt had TTS and is now very anxious and stating he is having a panic attack. Attempted to talk pt through attack and pt was able to calm down some.

## 2021-05-04 NOTE — ED Notes (Signed)
Pt ambulatory with assistance to the bathroom and back to stretcher. Pt states he was asleep and now he can't sleep because of all the noise. Pt informed that we can't control the noise in the ER but he could cover his head to block the light and try to sleep again. Pt disagreed but got back into bed, side rails up x2. Pt covered with blankets. No acute changes noted. Will continue to monitor.

## 2021-05-04 NOTE — ED Notes (Deleted)
Breakfast order placed ?

## 2021-05-04 NOTE — Progress Notes (Signed)
Patient has been denied by [facility] due to no beds available. Patient meets [facility] inpatient criteria per Trinna Post, PA-C. Patient has been faxed out to the following facilities:    [facility]  [address], [place] [address] [phone] [facility]  [address] [place] [address] [facility]  [place], [place] [address] [phone] [place]  [place], [place] [address] [phone]  [facility]  [place] [address] [place] [address] [phone] [facility]  [address] [place] [address] [phone]  [facility]  [address] [place] [address] [phone] [facility]  [address] [place] [address] [phone]  [facility]  [address], [place] [address] [phone] [facility]  [address] [place] [address] [phone] [facility]  [address], [place] [address] [phone] [facility]  [address] [place] [address] [phone]  [facility]  [address] [place], [place] [address] [place]  [facility]  [address], [place] [address] [phone] [place]  [address] [place] [address] [phone] [place] [address] [facility] [place]  [address] [place] [address] [phone] [place], MSW, LCSW-A   2:48 PM 05/04/2021

## 2021-05-04 NOTE — ED Notes (Signed)
Pt asking for his lorazepam for his anxiety. Advised pt will look to see if it is due.

## 2021-05-04 NOTE — ED Notes (Signed)
Pt given ginger ale.

## 2021-05-05 MED ORDER — ALUM & MAG HYDROXIDE-SIMETH 200-200-20 MG/5ML PO SUSP
30.0000 mL | Freq: Three times a day (TID) | ORAL | Status: DC
  Administered 2021-05-05 – 2021-05-07 (×6): 30 mL via ORAL
  Filled 2021-05-05 (×6): qty 30

## 2021-05-05 MED ORDER — ACETAMINOPHEN 325 MG PO TABS
650.0000 mg | ORAL_TABLET | Freq: Four times a day (QID) | ORAL | Status: DC | PRN
  Administered 2021-05-05 – 2021-05-06 (×4): 650 mg via ORAL
  Filled 2021-05-05 (×3): qty 2

## 2021-05-05 NOTE — Progress Notes (Signed)
CSW made multiple attempts to follow-up with [facility] regarding possible acceptance, but was unsuccessful. Patient has been faxed out to secure [facility] inpatient placement. Patient meets [place] inpatient criteria per Trinna Post, PA-C and has been faxed out to the following facilities:    [facility]  [address], [place] [address] [phone] [facility]  [address] [place] [address] [facility]  [place], [place] [address] [phone] [place]  [place], [place] [address] [phone]  [facility]  [address] [place] [address] [phone] [facility]  [address] [place] [address] [phone]  [facility]  [address] [place] [address] [phone] [facility]  [address] [place] [address] [phone]  [facility]  [address], [place] [address] [phone] [facility]  [address] [place] [address] [phone] [facility]  [address], [place] [address] [phone] [facility]  [address], [place] [address] [phone]  [facility]  [address] [place], [place] [address] [place]  [facility]  [address] [place] [address] [phone] [place]  [address] [place] [address] [phone]  [place] [address] [place]  [facility]  [address] [place] [address] [phone] [place], MSW, LCSW-A  11:30 AM 05/05/2021

## 2021-05-05 NOTE — Progress Notes (Signed)
CSW made multiple attempts to contact [facility]: [phone], [phone], [phone], and [phone] at 6:12pm, 9:13pm, and 9:14pm but was unsuccessful. CSW will have 1st shift follow-up.   Benjaman Kindler, MSW, LCSWA 05/05/2021 12:21 AM

## 2021-05-05 NOTE — ED Notes (Signed)
Received verbal report from [place] at this time

## 2021-05-05 NOTE — ED Notes (Signed)
Pt ambulated back to room with assistance at this timje

## 2021-05-05 NOTE — ED Notes (Signed)
Patient reports he is no longer suicidal and needs to go home to "take care of things."  Patient educated on plan of care.  Encouraged to rest at this time

## 2021-05-05 NOTE — BH Assessment (Signed)
RE-ASSESSMENT: Contacted pt's RN Ma Katrina to re-assess pt. She advised that pt had been given Ativan and was asleep as a result.   RN advised that pt is excessively anxious still and told his sitter that he intended to fall to harm himself. RN stated he seemed to be fearful of going home/being discharged. He picked a time when the sitter went to the bathroom and nurses were at the station to fall at the end of the bed intentionally to hurt himself. Thoughts and plans to hurt himself still present and he is acting on them at this time.   Cherril Hett T. Mare Ferrari, [place], [facility], [facility] Triage Specialist [facility]

## 2021-05-05 NOTE — ED Notes (Signed)
Patient requesting Tylenol for generalized pain

## 2021-05-05 NOTE — ED Notes (Signed)
Patient continues to repeat "I am having a stroke.  I think I am having a stroke.  I need to be evaluated."  No weakness or slurred speech noted.  Patient able to stand up and walk to nursing desk.  Steady gait noted.  When patient was instructed to return to the bed he stood in middle of the hall and stated "I am passing out.  I am passing out."  Patient assisted bad to bed.  No weakness noted.  Patient remained alert

## 2021-05-05 NOTE — Progress Notes (Addendum)
Addendum  *Pt has been denied at [facility] due to high pt's acuity. CSW called [facility] [phone] and spoke with Kenney Houseman who advised to send referral. CSW sent referral to [phone]. Update- [facility] is reviewing and maybe able to offer but pt can not be IVC'd to be fully accepted.   CSW attempted to call all of [facility] [place] [facility] phone numbers:[phone], [phone], [phone], [phone].  CSW was able to speak with Intake at [facility] who advised that pt has been denied.  CSW called [facility] Intake [phone] and re-sent referral for review.    CSW called [place] and they do not accept Adult Medicaid.   CSW also sent referral to:  [place]   Situation is still on going and CSW will continue to follow and assist with placement needs for Anthony Lamb.  Benjaman Kindler, MSW, [facility] 05/05/2021 11:24 PM

## 2021-05-05 NOTE — ED Notes (Addendum)
Sherelle Lynch informed this RN that while the patient was ambulating to the bathroom, the patient started to lower himself to the floor. The patient stated he fell and "broke all of his bones." Patient helped off of the floor and escorted back to the bed. Since this RN was in the nutrition room during the event, an assessment was done on the patient. There were no injuries identified on the patient. The patient admitted that he didn't fall and that he laid on the floor on purpose. Instructed patient to remain in bed and not to get up without assistance. Patient informed he will only use the restroom for bowel movements moving forward.

## 2021-05-05 NOTE — ED Notes (Signed)
Per pt he has to go to the bathroom to have a bowel movement. Pt escorted to restroom with staff at this time

## 2021-05-05 NOTE — ED Notes (Signed)
Pt was found on the floor by the end of the bed. Pt stated he slid as he was feeling dizzy. Pt denies hitting head. Provider notified of the fall. Pt kept on telling his sitter previously that he was going to fall soon as he did not want to go home. Complains of right hip pain. Tylenol given.

## 2021-05-05 NOTE — ED Notes (Signed)
Pt is c/o kidney pain and states that he might have a kidney stone

## 2021-05-06 LAB — SARS CORONAVIRUS 2 (TAT 6-24 HRS): SARS Coronavirus 2: NEGATIVE

## 2021-05-06 MED ORDER — ADULT MULTIVITAMIN W/MINERALS CH
1.0000 | ORAL_TABLET | Freq: Every day | ORAL | Status: DC
  Administered 2021-05-06: 1 via ORAL
  Filled 2021-05-06: qty 1

## 2021-05-06 NOTE — ED Notes (Signed)
Lunch at bedside

## 2021-05-06 NOTE — Consult Note (Signed)
  Anthony Lamb, 65 y.o., male patient presented to emergency department with SI and plan.  Patient seen via telepsych by this provider; chart reviewed and consulted with Dr. Dwyane Dee on 05/06/21.  On re-assessment today Anthony Lamb reports feelings of hopelessness and despair secondary to chronic medication issues and substance abuse.  He has limited protective factors and cannot contract for safety today.    During evaluation Anthony Lamb is laying upright in hospital bed. He is alert/oriented x 4; calm/cooperative; depressed mood with congruent affect. Patient is speaking in a clear tone at moderate volume, and normal pace; with good eye contact.  His thought process is coherent, ruminates on his circumstances. There is no indication that he is currently responding to internal/external stimuli or experiencing delusional thought content.  Patient endorses suicidal/self-harm ideations but does not state plan today. He does not appear psychotic and is not paranoia.  Patient has remained calm throughout assessment and has answered questions appropriately.     Recommendations: He continues to meet criteria for inpatient admission; Is under review at [facility].   Disposition: Recommend psychiatric Inpatient admission when medically cleared.   Anthony Lamb, PMHNP

## 2021-05-06 NOTE — ED Provider Notes (Signed)
Emergency Medicine Observation Re-evaluation Note  Anthony Lamb is a 65 y.o. male, seen on rounds today.  Pt initially presented to the ED for complaints of Suicidal Currently, the patient is resting.  Physical Exam  BP 104/72 (BP Location: Left Arm)   Pulse 73   Temp 98.4 F (36.9 C) (Oral)   Resp 13   SpO2 96%  Physical Exam General: resting comfortably, NAD Lungs: normal WOB Psych: currently calm and resting  ED Course / MDM  EKG:EKG Interpretation  Date/Time:  Tuesday May 03 2021 17:58:41 EST Ventricular Rate:  62 PR Interval:  192 QRS Duration: 104 QT Interval:  456 QTC Calculation: 462 R Axis:   61 Text Interpretation: Normal sinus rhythm Normal ECG Confirmed by Merrily Pew [phone]) on 05/05/2021 4:37:30 AM  I have reviewed the labs performed to date as well as medications administered while in observation.  Recent changes in the last 24 hours include none.  Plan  Current plan is for inpatient psych placement, TOC arranging.  Anthony Lamb is not under involuntary commitment.     Anthony Lamb, [place] 05/06/21 [id number]

## 2021-05-06 NOTE — ED Notes (Signed)
Pt refuses to take a shower.

## 2021-05-06 NOTE — ED Notes (Signed)
On TTS with therapist

## 2021-05-06 NOTE — BH Assessment (Signed)
Patient is to be admitted to [facility] by Dr. Weber Cooks.  Attending Physician will be Dr.  Louis Meckel .   Patient has been assigned to room L29, by [facility] Charge Nurse Oleta Mouse.   Intake Paper Work has been signed and placed on patient chart.  ER staff is aware of the admission: Dr. Dayna Barker, ER MD  Joelene Millin, Patient's Nurse  Helene Kelp, Patient Access.

## 2021-05-07 ENCOUNTER — Other Ambulatory Visit: Payer: Self-pay

## 2021-05-07 ENCOUNTER — Inpatient Hospital Stay
Admission: AD | Admit: 2021-05-07 | Discharge: 2021-05-20 | DRG: 885 | Disposition: A | Discharge status: Home / Self Care | Payer: Medicaid Other | Source: Intra-hospital | Attending: Psychiatry | Admitting: Psychiatry

## 2021-05-07 ENCOUNTER — Encounter: Payer: Self-pay | Admitting: Psychiatry

## 2021-05-07 DIAGNOSIS — Z20822 Contact with and (suspected) exposure to covid-19: Secondary | ICD-10-CM | POA: Diagnosis present

## 2021-05-07 DIAGNOSIS — F1320 Sedative, hypnotic or anxiolytic dependence, uncomplicated: Secondary | ICD-10-CM | POA: Diagnosis present

## 2021-05-07 DIAGNOSIS — K219 Gastro-esophageal reflux disease without esophagitis: Secondary | ICD-10-CM | POA: Diagnosis present

## 2021-05-07 DIAGNOSIS — G2581 Restless legs syndrome: Secondary | ICD-10-CM | POA: Diagnosis present

## 2021-05-07 DIAGNOSIS — R45851 Suicidal ideations: Secondary | ICD-10-CM | POA: Diagnosis present

## 2021-05-07 DIAGNOSIS — F332 Major depressive disorder, recurrent severe without psychotic features: Secondary | ICD-10-CM | POA: Diagnosis not present

## 2021-05-07 DIAGNOSIS — E440 Moderate protein-calorie malnutrition: Secondary | ICD-10-CM | POA: Diagnosis not present

## 2021-05-07 DIAGNOSIS — Z87891 Personal history of nicotine dependence: Secondary | ICD-10-CM

## 2021-05-07 DIAGNOSIS — F1011 Alcohol abuse, in remission: Secondary | ICD-10-CM | POA: Diagnosis present

## 2021-05-07 DIAGNOSIS — R1310 Dysphagia, unspecified: Secondary | ICD-10-CM

## 2021-05-07 DIAGNOSIS — Z9151 Personal history of suicidal behavior: Secondary | ICD-10-CM

## 2021-05-07 DIAGNOSIS — Z800 Family history of malignant neoplasm of digestive organs: Secondary | ICD-10-CM

## 2021-05-07 DIAGNOSIS — Z818 Family history of other mental and behavioral disorders: Secondary | ICD-10-CM

## 2021-05-07 DIAGNOSIS — G4700 Insomnia, unspecified: Secondary | ICD-10-CM | POA: Diagnosis present

## 2021-05-07 DIAGNOSIS — F339 Major depressive disorder, recurrent, unspecified: Secondary | ICD-10-CM | POA: Diagnosis not present

## 2021-05-07 DIAGNOSIS — K580 Irritable bowel syndrome with diarrhea: Secondary | ICD-10-CM | POA: Diagnosis present

## 2021-05-07 DIAGNOSIS — F1010 Alcohol abuse, uncomplicated: Secondary | ICD-10-CM | POA: Diagnosis present

## 2021-05-07 DIAGNOSIS — F419 Anxiety disorder, unspecified: Secondary | ICD-10-CM | POA: Diagnosis present

## 2021-05-07 DIAGNOSIS — Z79899 Other long term (current) drug therapy: Secondary | ICD-10-CM

## 2021-05-07 DIAGNOSIS — I10 Essential (primary) hypertension: Secondary | ICD-10-CM | POA: Diagnosis not present

## 2021-05-07 DIAGNOSIS — Z8249 Family history of ischemic heart disease and other diseases of the circulatory system: Secondary | ICD-10-CM

## 2021-05-07 DIAGNOSIS — K279 Peptic ulcer, site unspecified, unspecified as acute or chronic, without hemorrhage or perforation: Secondary | ICD-10-CM | POA: Diagnosis not present

## 2021-05-07 DIAGNOSIS — F607 Dependent personality disorder: Secondary | ICD-10-CM | POA: Diagnosis present

## 2021-05-07 LAB — LIPID PANEL
Cholesterol: 183 mg/dL (ref 0–200)
HDL: 69 mg/dL (ref >40)
LDL Cholesterol: 98 mg/dL (ref 0–99)
Total CHOL/HDL Ratio: 2.7 RATIO
Triglycerides: 79 mg/dL (ref <150)
VLDL: 16 mg/dL (ref 0–40)

## 2021-05-07 LAB — RESP PANEL BY RT-PCR (FLU A&B, COVID) ARPGX2
Influenza A by PCR: NEGATIVE
Influenza B by PCR: NEGATIVE
SARS Coronavirus 2 by RT PCR: NEGATIVE

## 2021-05-07 LAB — TSH: TSH: 0.321 u[IU]/mL — ABNORMAL LOW (ref 0.350–4.500)

## 2021-05-07 MED ORDER — PANTOPRAZOLE SODIUM 40 MG PO TBEC: 40.0000 mg | DELAYED_RELEASE_TABLET | Freq: Every morning | ORAL | Status: DC

## 2021-05-07 MED ORDER — TRAZODONE HCL 50 MG PO TABS
50.0000 mg | ORAL_TABLET | Freq: Every day | ORAL | Status: DC
  Administered 2021-05-07 – 2021-05-19 (×13): 50 mg via ORAL
  Filled 2021-05-07 (×13): qty 1

## 2021-05-07 MED ORDER — FLUOXETINE HCL 10 MG PO CAPS
10.0000 mg | ORAL_CAPSULE | Freq: Every day | ORAL | Status: DC
  Administered 2021-05-07 – 2021-05-11 (×5): 10 mg via ORAL
  Filled 2021-05-07 (×5): qty 1

## 2021-05-07 MED ORDER — VERAPAMIL HCL ER 240 MG PO TBCR
240.0000 mg | EXTENDED_RELEASE_TABLET | Freq: Every day | ORAL | Status: DC
  Administered 2021-05-07 – 2021-05-20 (×14): 240 mg via ORAL
  Filled 2021-05-07 (×14): qty 1

## 2021-05-07 MED ORDER — SUCRALFATE 1 G PO TABS: 1.0000 g | ORAL_TABLET | Freq: Four times a day (QID) | ORAL | Status: DC

## 2021-05-07 MED ORDER — QUETIAPINE FUMARATE 100 MG PO TABS: 300.0000 mg | ORAL_TABLET | Freq: Every day | ORAL | Status: DC

## 2021-05-07 MED ORDER — SUCRALFATE 1 GM/10ML PO SUSP
1.0000 g | Freq: Three times a day (TID) | ORAL | Status: DC
  Administered 2021-05-07: 1 g via ORAL
  Filled 2021-05-07 (×3): qty 10

## 2021-05-07 MED ORDER — LORAZEPAM 1 MG PO TABS: 1.0000 mg | ORAL_TABLET | Freq: Three times a day (TID) | ORAL | Status: DC

## 2021-05-07 MED ORDER — ALUM & MAG HYDROXIDE-SIMETH 200-200-20 MG/5ML PO SUSP: 30.0000 mL | Freq: Three times a day (TID) | ORAL | Status: DC

## 2021-05-07 MED ORDER — ZOLPIDEM TARTRATE 5 MG PO TABS
5.0000 mg | ORAL_TABLET | Freq: Every day | ORAL | Status: DC
Start: 2021-05-07 — End: 2021-05-07

## 2021-05-07 MED ORDER — ADULT MULTIVITAMIN W/MINERALS CH
1.0000 | ORAL_TABLET | Freq: Every day | ORAL | Status: DC
  Administered 2021-05-07 – 2021-05-20 (×14): 1 via ORAL
  Filled 2021-05-07 (×14): qty 1

## 2021-05-07 MED ORDER — PROPRANOLOL HCL 10 MG PO TABS
10.0000 mg | ORAL_TABLET | Freq: Two times a day (BID) | ORAL | Status: DC
  Administered 2021-05-07 – 2021-05-20 (×25): 10 mg via ORAL
  Filled 2021-05-07 (×29): qty 1

## 2021-05-07 MED ORDER — ONDANSETRON 4 MG PO TBDP
4.0000 mg | ORAL_TABLET | ORAL | Status: DC | PRN
  Administered 2021-05-13 – 2021-05-19 (×6): 4 mg via ORAL
  Filled 2021-05-07 (×6): qty 1

## 2021-05-07 MED ORDER — VERAPAMIL HCL ER 240 MG PO TBCR: 240.0000 mg | EXTENDED_RELEASE_TABLET | Freq: Every day | ORAL | Status: DC

## 2021-05-07 MED ORDER — ZOLPIDEM TARTRATE 5 MG PO TABS
10.0000 mg | ORAL_TABLET | Freq: Every day | ORAL | Status: DC
  Administered 2021-05-07 – 2021-05-19 (×13): 10 mg via ORAL
  Filled 2021-05-07 (×13): qty 2

## 2021-05-07 MED ORDER — LORAZEPAM 1 MG PO TABS
1.0000 mg | ORAL_TABLET | Freq: Three times a day (TID) | ORAL | Status: DC
  Administered 2021-05-07 – 2021-05-20 (×40): 1 mg via ORAL
  Filled 2021-05-07 (×40): qty 1

## 2021-05-07 MED ORDER — QUETIAPINE FUMARATE 100 MG PO TABS
300.0000 mg | ORAL_TABLET | Freq: Every day | ORAL | Status: DC
  Administered 2021-05-07 – 2021-05-19 (×13): 300 mg via ORAL
  Filled 2021-05-07 (×13): qty 3

## 2021-05-07 MED ORDER — PANTOPRAZOLE SODIUM 40 MG PO TBEC
40.0000 mg | DELAYED_RELEASE_TABLET | Freq: Every day | ORAL | Status: DC
  Administered 2021-05-08 – 2021-05-20 (×13): 40 mg via ORAL
  Filled 2021-05-07 (×13): qty 1

## 2021-05-07 MED ORDER — LISINOPRIL 20 MG PO TABS: 10.0000 mg | ORAL_TABLET | Freq: Every day | ORAL | Status: DC

## 2021-05-07 MED ORDER — LORAZEPAM 1 MG PO TABS: 1.0000 mg | ORAL_TABLET | ORAL | Status: DC | PRN

## 2021-05-07 MED ORDER — ACETAMINOPHEN 500 MG PO TABS
500.0000 mg | ORAL_TABLET | Freq: Four times a day (QID) | ORAL | Status: DC | PRN
Start: 2021-05-07 — End: 2021-05-07

## 2021-05-07 MED ORDER — ZOLPIDEM TARTRATE 5 MG PO TABS: 5.0000 mg | ORAL_TABLET | Freq: Every day | ORAL | Status: DC

## 2021-05-07 MED ORDER — ACETAMINOPHEN 325 MG PO TABS
650.0000 mg | ORAL_TABLET | Freq: Four times a day (QID) | ORAL | Status: DC | PRN
  Administered 2021-05-07 – 2021-05-20 (×30): 650 mg via ORAL
  Filled 2021-05-07 (×30): qty 2

## 2021-05-07 MED ORDER — SUCRALFATE 1 G PO TABS
1.0000 g | ORAL_TABLET | Freq: Four times a day (QID) | ORAL | Status: DC
Start: 2021-05-07 — End: 2021-05-07

## 2021-05-07 MED ORDER — MAGNESIUM HYDROXIDE 400 MG/5ML PO SUSP: 30.0000 mL | Freq: Every day | ORAL | Status: DC | PRN

## 2021-05-07 MED ORDER — TIZANIDINE HCL 4 MG PO TABS
4.0000 mg | ORAL_TABLET | Freq: Three times a day (TID) | ORAL | Status: DC | PRN
  Filled 2021-05-07: qty 1

## 2021-05-07 MED ORDER — PROPRANOLOL HCL 10 MG PO TABS
10.0000 mg | ORAL_TABLET | Freq: Two times a day (BID) | ORAL | Status: DC
  Filled 2021-05-07 (×2): qty 1

## 2021-05-07 MED ORDER — LISINOPRIL 20 MG PO TABS
10.0000 mg | ORAL_TABLET | Freq: Every day | ORAL | Status: DC
  Administered 2021-05-07 – 2021-05-20 (×14): 10 mg via ORAL
  Filled 2021-05-07 (×14): qty 1

## 2021-05-07 MED ORDER — ALUM & MAG HYDROXIDE-SIMETH 200-200-20 MG/5ML PO SUSP
30.0000 mL | ORAL | Status: DC | PRN
  Administered 2021-05-09 – 2021-05-16 (×5): 30 mL via ORAL
  Filled 2021-05-07 (×5): qty 30

## 2021-05-07 MED ORDER — TIZANIDINE HCL 4 MG PO TABS: 4.0000 mg | ORAL_TABLET | Freq: Three times a day (TID) | ORAL | Status: DC | PRN

## 2021-05-07 MED ORDER — ENSURE ENLIVE PO LIQD
237.0000 mL | Freq: Two times a day (BID) | ORAL | Status: DC
  Administered 2021-05-07: 237 mL via ORAL

## 2021-05-07 MED ORDER — PROPRANOLOL HCL 10 MG PO TABS: 10.0000 mg | ORAL_TABLET | Freq: Two times a day (BID) | ORAL | Status: DC

## 2021-05-07 NOTE — H&P (Signed)
Psychiatric Admission Assessment Adult  Patient Identification: Anthony Lamb MRN:  [id number] Date of Evaluation:  05/07/2021 Chief Complaint:  MDD (major depressive disorder), recurrent episode, with atypical features ([place]) [F33.9] Principal Diagnosis: MDD (major depressive disorder), recurrent episode, with atypical features ([place]) Diagnosis:  Principal Problem:   MDD (major depressive disorder), recurrent episode, with atypical features ([place]) Active Problems:   Essential hypertension   PUD (peptic ulcer disease)   Malnutrition of moderate degree ([place])   Alcohol abuse  History of Present Illness: Patient seen and chart reviewed.  65 year old man transferred to [place] from [facility] where he presented to their emergency room with anxiety and depression but this time most definitely with threats that he was going to cut his wrist to kill himself.  Apparently he called 911 stating that he was going to kill himself and had himself brought to the hospital.  This is the most recent of quite a few visits to the emergency room the patient has had.  He has been seen by psychiatry a few times and previously had not been thought to need hospitalization.  On interview with me today the patient said he is extremely depressed.  He feels completely hopeless.  He has several things that he focuses on almost exclusively.  First is that he believes he is going to lose his apartment because he will not be able to pay his rent on time today.  Second is that he absolutely believes he has esophageal cancer or some kind of tumor in his throat even though this has never been assessed or diagnosed.  Says that he cannot swallow food at all.  Patient says he has been feeling terrible for months.  Has not been able to swallow.  Has been losing weight.  Denies that he is seeing a psychiatrist or therapist outside the hospital.  Admits to having threatened to cut his wrist but is not specifically threatening to harm himself in  the hospital.  Patient admits he was drinking regularly up until a couple weeks ago although he minimizes the amount that he was drinking.  He says that he was still taking Ambien 10 mg at night and Ativan during the day and Seroquel 300 mg at night.  He is extremely focused on wanting to continue all of those medicines.  Denies having any current hallucinations denies homicidal ideation.  Patient was able to remember 2 out of 3 objects at 3 minutes.  Seemed to be able to remember a lot of basic facts about his life.  Full cognitive assessment not performed Associated Signs/Symptoms: Depression Symptoms:  depressed mood, anhedonia, insomnia, fatigue, difficulty concentrating, hopelessness, suicidal thoughts with specific plan, anxiety, loss of energy/fatigue, Duration of Depression Symptoms: Greater than two weeks  (Hypo) Manic Symptoms:  Impulsivity, Anxiety Symptoms:  Excessive Worry, Psychotic Symptoms:  Delusions, PTSD Symptoms: Negative Total Time spent with patient: 1 hour  Past Psychiatric History: Patient could not remember for certain having seen a psychiatrist in the past but it looks like he had a previous hospitalization a couple years ago at behavioral [facility] with similar presentation.  It looks like the anxiety was more impressive than the depression was.  At that time however he had also gotten into the hospital by threatening to cut his wrist.  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  There is no  past history of a diagnosis of major depression or bipolar disorder or psychosis.  Is the patient at risk to self? Yes.    Has the patient been a risk to self in the past 6 months? Yes.    Has the patient been a risk to self within the distant past? Yes.    Is the patient a risk to others? No.  Has the  patient been a risk to others in the past 6 months? No.  Has the patient been a risk to others within the distant past? No.   Prior Inpatient Therapy:   Prior Outpatient Therapy:    Alcohol Screening: 1. How often do you have a drink containing alcohol?: 4 or more times a week 2. How many drinks containing alcohol do you have on a typical day when you are drinking?: 1 or 2 3. How often do you have six or more drinks on one occasion?: Never AUDIT-C Score: 4 4. How often during the last year have you found that you were not able to stop drinking once you had started?: Never 5. How often during the last year have you failed to do what was normally expected from you because of drinking?: Never 6. How often during the last year have you needed a first drink in the morning to get yourself going after a heavy drinking session?: Never 7. How often during the last year have you had a feeling of guilt of remorse after drinking?: Never 8. How often during the last year have you been unable to remember what happened the night before because you had been drinking?: Never 9. Have you or someone else been injured as a result of your drinking?: No 10. Has a relative or friend or a doctor or another health worker been concerned about your drinking or suggested you cut down?: No Alcohol Use Disorder Identification Test Final Score (AUDIT): 4 Substance Abuse History in the last 12 months:  Yes.   Consequences of Substance Abuse: Probably relate denies history of DTs but probably related to worsening mood Previous Psychotropic Medications: Yes  Psychological Evaluations: Yes  Past Medical History:  Past Medical History:  Diagnosis Date   Alcohol abuse, in remission    Anal fissure    Anemia    Ankylosing spondylitis (HCC)    Anxiety    Arthritis    Chronic diarrhea    Chronic headaches    Chronic pain    Colitis    Colon polyps    Depression    Esophagitis    Gastric polyps    hyperplastic and  fundic gland   Gastritis    GERD (gastroesophageal reflux disease)    Hypertension    IBS (irritable bowel syndrome)    Poor dentition    SIADH (syndrome of inappropriate ADH production) ([place])     Past Surgical History:  Procedure Laterality Date   BIOPSY  11/26/2018   Procedure: BIOPSY;  Surgeon: Gatha Mayer, MD;  Location: [facility];  Service: Endoscopy;;   COLONOSCOPY W/ BIOPSIES     COLONOSCOPY WITH PROPOFOL N/A 11/26/2018   Procedure: COLONOSCOPY WITH PROPOFOL;  Surgeon: Gatha Mayer, MD;  Location: [facility];  Service: Endoscopy;  Laterality: N/A;   ESOPHAGOGASTRODUODENOSCOPY     ESOPHAGOGASTRODUODENOSCOPY (EGD) WITH PROPOFOL N/A 11/26/2018   Procedure: ESOPHAGOGASTRODUODENOSCOPY (EGD) WITH PROPOFOL;  Surgeon: Gatha Mayer, MD;  Location: [facility];  Service: Endoscopy;  Laterality: N/A;   HEMOSTASIS CLIP PLACEMENT  11/26/2018   Procedure: HEMOSTASIS CLIP  PLACEMENT;  Surgeon: Gatha Mayer, MD;  Location: [facility];  Service: Endoscopy;;   HERNIA REPAIR Bilateral    HOT HEMOSTASIS N/A 11/26/2018   Procedure: HOT HEMOSTASIS (ARGON PLASMA COAGULATION/BICAP);  Surgeon: Gatha Mayer, MD;  Location: [facility];  Service: Endoscopy;  Laterality: N/A;   OPEN REDUCTION INTERNAL FIXATION (ORIF) DISTAL RADIAL FRACTURE Right 02/11/2018   Procedure: OPEN REDUCTION INTERNAL FIXATION (ORIF) DISTAL RADIAL FRACTURE;  Surgeon: Milly Jakob, MD;  Location: [place] [facility];  Service: Orthopedics;  Laterality: Right;   POLYPECTOMY  11/26/2018   Procedure: POLYPECTOMY;  Surgeon: Gatha Mayer, MD;  Location: [facility];  Service: Endoscopy;;   TONSILLECTOMY     Family History:  Family History  Problem Relation Age of Onset   Anxiety disorder Mother    Congestive Heart Failure Mother    Crohn's disease Mother    Colon cancer Mother 77   Arthritis Father    High blood pressure Father    Crohn's disease Father    Family Psychiatric  History: Denies knowing of any Tobacco  Screening:   Social History:  Social History   Substance and Sexual Activity  Alcohol Use Not Currently   Comment: 2 25 oz of beer a night     Social History   Substance and Sexual Activity  Drug Use Yes   Types: Marijuana    Additional Social History:                           Allergies:   Allergies  Allergen Reactions   Nsaids Other (See Comments)    GI upset- history of peptic ulcers!!   Diphenhydramine Hcl Other (See Comments)    Restlessness   Flexeril [Cyclobenzaprine] Other (See Comments)    Restlessness   Hctz [Hydrochlorothiazide] Other (See Comments)    Caused to lose sodium when taking with Lisinopril   Rexulti [Brexpiprazole] Other (See Comments)    "Made me not feel right- restless"   Seroquel [Quetiapine] Other (See Comments)    Restless legs and makes the patient sweat- also does not help patient's insomnia. Pt takes this at home   Gabapentin Diarrhea   Hydroxyzine Other (See Comments)    Restlessness   Lab Results:  Results for orders placed or performed during the hospital encounter of 05/03/21 (from the past 48 hour(s))  SARS CORONAVIRUS 2 (TAT 6-24 HRS) Nasopharyngeal Nasopharyngeal Swab     Status: None   Collection Time: 05/06/21 10:26 AM   Specimen: Nasopharyngeal Swab  Result Value Ref Range   SARS Coronavirus 2 NEGATIVE NEGATIVE    Comment: (NOTE) SARS-CoV-2 target nucleic acids are NOT DETECTED.  The SARS-CoV-2 RNA is generally detectable in upper and lower respiratory specimens during the acute phase of infection. Negative results do not preclude SARS-CoV-2 infection, do not rule out co-infections with other pathogens, and should not be used as the sole basis for treatment or other patient management decisions. Negative results must be combined with clinical observations, patient history, and epidemiological information. The expected result is Negative.  Fact Sheet for  Patients: SugarRoll.be  Fact Sheet for Healthcare Providers: https://www.woods-mathews.com/  This test is not yet approved or cleared by the [place] FDA and  has been authorized for detection and/or diagnosis of SARS-CoV-2 by FDA under an Emergency Use Authorization (EUA). This EUA will remain  in effect (meaning this test can be used) for the duration of the COVID-19 declaration under Se ction 564(b)(1) of the Act,  21 U.S.C. section 360bbb-3(b)(1), unless the authorization is terminated or revoked sooner.  Performed at [facility], [place] [address] [place], [place] [address]   Resp Panel by RT-PCR (Flu A&B, Covid) Nasopharyngeal Swab     Status: None   Collection Time: 05/06/21 11:48 PM   Specimen: Nasopharyngeal Swab; Nasopharyngeal(NP) swabs in vial transport medium  Result Value Ref Range   SARS Coronavirus 2 by RT PCR NEGATIVE NEGATIVE    Comment: (NOTE) SARS-CoV-2 target nucleic acids are NOT DETECTED.  The SARS-CoV-2 RNA is generally detectable in upper respiratory specimens during the acute phase of infection. The lowest concentration of SARS-CoV-2 viral copies this assay can detect is 138 copies/mL. A negative result does not preclude SARS-Cov-2 infection and should not be used as the sole basis for treatment or other patient management decisions. A negative result may occur with  improper specimen collection/handling, submission of specimen other than nasopharyngeal swab, presence of viral mutation(s) within the areas targeted by this assay, and inadequate number of viral copies(<138 copies/mL). A negative result must be combined with clinical observations, patient history, and epidemiological information. The expected result is Negative.  Fact Sheet for Patients:  EntrepreneurPulse.com.au  Fact Sheet for Healthcare Providers:  IncredibleEmployment.be  This test is no t yet  approved or cleared by the [place] FDA and  has been authorized for detection and/or diagnosis of SARS-CoV-2 by FDA under an Emergency Use Authorization (EUA). This EUA will remain  in effect (meaning this test can be used) for the duration of the COVID-19 declaration under Section 564(b)(1) of the Act, 21 U.S.C.section 360bbb-3(b)(1), unless the authorization is terminated  or revoked sooner.       Influenza A by PCR NEGATIVE NEGATIVE   Influenza B by PCR NEGATIVE NEGATIVE    Comment: (NOTE) The Xpert Xpress SARS-CoV-2/FLU/RSV plus assay is intended as an aid in the diagnosis of influenza from Nasopharyngeal swab specimens and should not be used as a sole basis for treatment. Nasal washings and aspirates are unacceptable for Xpert Xpress SARS-CoV-2/FLU/RSV testing.  Fact Sheet for Patients: EntrepreneurPulse.com.au  Fact Sheet for Healthcare Providers: IncredibleEmployment.be  This test is not yet approved or cleared by the [place] FDA and has been authorized for detection and/or diagnosis of SARS-CoV-2 by FDA under an Emergency Use Authorization (EUA). This EUA will remain in effect (meaning this test can be used) for the duration of the COVID-19 declaration under Section 564(b)(1) of the Act, 21 U.S.C. section 360bbb-3(b)(1), unless the authorization is terminated or revoked.  Performed at [facility], [place] [address] [place], [place] [address]     Blood Alcohol level:  Lab Results  Component Value Date   [facility] <10 05/03/2021   ETH <10 84/16/6063    Metabolic Disorder Labs:  Lab Results  Component Value Date   HGBA1C 5.3 11/12/2016   MPG 105 11/12/2016   No results found for: PROLACTIN Lab Results  Component Value Date   CHOL 177 11/12/2016   TRIG 106 11/12/2016   HDL 48 11/12/2016   CHOLHDL 3.7 11/12/2016   VLDL 21 11/12/2016   LDLCALC 108 (H) 11/12/2016   LDLCALC 126 (H) 12/17/2013    Current  Medications: Current Facility-Administered Medications  Medication Dose Route Frequency Provider Last Rate Last Admin   acetaminophen (TYLENOL) tablet 650 mg  650 mg Oral Q6H PRN Parks Ranger, DO   650 mg at 05/07/21 1117   alum & mag hydroxide-simeth (MAALOX/MYLANTA) 200-200-20 MG/5ML suspension 30 mL  30 mL Oral Q4H PRN Parks Ranger, DO       FLUoxetine (PROZAC) capsule 10 mg  10 mg Oral Daily Aaronjames Kelsay, Madie Reno, MD       lisinopril (ZESTRIL) tablet 10 mg  10 mg Oral Daily Parks Ranger, DO   10 mg at 05/07/21 1116   LORazepam (ATIVAN) tablet 1 mg  1 mg Oral TID Parks Ranger, DO   1 mg at 05/07/21 1119   magnesium hydroxide (MILK OF MAGNESIA) suspension 30 mL  30 mL Oral Daily PRN Parks Ranger, DO       multivitamin with minerals tablet 1 tablet  1 tablet Oral Daily Parks Ranger, DO   1 tablet at 05/07/21 1117   ondansetron (ZOFRAN-ODT) disintegrating tablet 4 mg  4 mg Oral Q4H PRN Parks Ranger, DO       [START ON 05/08/2021] pantoprazole (PROTONIX) EC tablet 40 mg  40 mg Oral Daily Zeppelin Commisso, Madie Reno, MD       propranolol (INDERAL) tablet 10 mg  10 mg Oral BID Biagio Snelson, Madie Reno, MD   10 mg at 05/07/21 1119   QUEtiapine (SEROQUEL) tablet 300 mg  300 mg Oral QHS Jusiah Aguayo, Madie Reno, MD       traZODone (DESYREL) tablet 50 mg  50 mg Oral QHS Herrick, Richard Edward, DO       verapamil (CALAN-SR) CR tablet 240 mg  240 mg Oral Daily Parks Ranger, DO   240 mg at 05/07/21 1117   zolpidem (AMBIEN) tablet 10 mg  10 mg Oral QHS Mitchel Delduca, Madie Reno, MD       PTA Medications: Medications Prior to Admission  Medication Sig Dispense Refill Last Dose   acetaminophen (TYLENOL) 500 MG tablet Take 500-1,000 mg by mouth every 6 (six) hours as needed (for pain).      chlordiazePOXIDE (LIBRIUM) 25 MG capsule 36m PO TID x 1D, then 25-522mPO BID X 1D, then 25-5065mO QD X 1D (Patient not taking: Reported on 05/03/2021) 10 capsule 0     diclofenac Sodium (VOLTAREN) 1 % GEL Apply 2 g topically 4 (four) times daily as needed (to painful sites). (Patient not taking: Reported on 05/03/2021)      famotidine (PEPCID) 20 MG tablet Take 1 tablet (20 mg total) by mouth 2 (two) times daily. (Patient not taking: Reported on 04/26/2021) 30 tablet 0    lisinopril (PRINIVIL,ZESTRIL) 10 MG tablet Take 1 tablet (10 mg total) by mouth daily. 30 tablet 0    LORazepam (ATIVAN) 1 MG tablet Take 1 mg by mouth 3 (three) times daily.      Multiple Vitamin (MULTIVITAMIN WITH MINERALS) TABS tablet Take 1 tablet by mouth daily. (May buy over the counter) (Patient taking differently: Take 1 tablet by mouth daily.) 1 tablet 0    ondansetron (ZOFRAN ODT) 4 MG disintegrating tablet Take 1 tablet (4 mg total) by mouth every 4 (four) hours as needed for nausea or vomiting. (Patient not taking: Reported on 05/03/2021) 20 tablet 0    ondansetron (ZOFRAN-ODT) 4 MG disintegrating tablet Take 1 tablet (4 mg total) by mouth every 4 (four) hours as needed for nausea or vomiting. (Patient not taking: Reported on 05/03/2021) 20 tablet 0    pantoprazole (PROTONIX) 40 MG tablet Take 1 tablet (40 mg total) by mouth daily. (Patient taking differently: Take 40 mg by mouth in the morning.) 30 tablet 11    propranolol (INDERAL) 10 MG tablet Take 10 mg by mouth  2 (two) times daily.      QUEtiapine (SEROQUEL) 300 MG tablet Take 300 mg by mouth at bedtime.      sucralfate (CARAFATE) 1 g tablet Take 1 g by mouth 4 (four) times daily.      sucralfate (CARAFATE) 1 GM/10ML suspension Take 10 mLs (1 g total) by mouth 4 (four) times daily -  with meals and at bedtime. (Patient not taking: Reported on 05/03/2021) 420 mL 0    tiZANidine (ZANAFLEX) 4 MG tablet Take 4 mg by mouth 3 (three) times daily as needed for muscle spasms.      verapamil (CALAN-SR) 240 MG CR tablet Take 240 mg by mouth daily.  2    zolpidem (AMBIEN) 5 MG tablet Take 5 mg by mouth at bedtime.        Musculoskeletal: Strength & Muscle Tone: decreased Gait & Station: unsteady Patient leans: Front            Psychiatric Specialty Exam:  Presentation  General Appearance: Casual  Eye Contact:Good  Speech:Clear and Coherent  Speech Volume:Normal  Handedness:Right   Mood and Affect  Mood:Euthymic  Affect:Congruent; Appropriate   Thought Process  Thought Processes:Coherent; Goal Directed  Duration of Psychotic Symptoms: Less than six months  Past Diagnosis of Schizophrenia or Psychoactive disorder: No  Descriptions of Associations:Intact  Orientation:Full (Time, Place and Person)  Thought Content:Logical  Hallucinations:No data recorded Ideas of Reference:None  Suicidal Thoughts:No data recorded Homicidal Thoughts:No data recorded  Sensorium  Memory:Immediate Good; Recent Good; Remote Good  Judgment:Good  Insight:Good   Executive Functions  Concentration:Good  Attention Span:Good  [place]  Language:Good   Psychomotor Activity  Psychomotor Activity:No data recorded  Assets  Assets:Communication Skills; Desire for Improvement; Financial Resources/Insurance; Housing; Leisure Time; Resilience   Sleep  Sleep:No data recorded   Physical Exam: Physical Exam Vitals and nursing note reviewed.  Constitutional:      Appearance: He is ill-appearing and toxic-appearing.  HENT:     Head: Normocephalic and atraumatic.     Mouth/Throat:     Pharynx: Oropharynx is clear.  Eyes:     Pupils: Pupils are equal, round, and reactive to light.  Cardiovascular:     Rate and Rhythm: Normal rate and regular rhythm.  Pulmonary:     Effort: Pulmonary effort is normal.     Breath sounds: Normal breath sounds.  Abdominal:     General: Abdomen is flat.     Palpations: Abdomen is soft.  Musculoskeletal:        General: Normal range of motion.  Skin:    General: Skin is warm and dry.  Neurological:     General:  No focal deficit present.     Mental Status: He is alert. Mental status is at baseline.  Psychiatric:        Attention and Perception: He is inattentive.        Mood and Affect: Mood is anxious. Affect is inappropriate.        Speech: Speech is tangential.        Behavior: Behavior is agitated.        Thought Content: Thought content is paranoid.        Cognition and Memory: Cognition is impaired. Memory is impaired.        Judgment: Judgment is inappropriate.   Review of Systems  Constitutional:  Positive for malaise/fatigue and weight loss.  HENT: Negative.    Eyes: Negative.   Respiratory: Negative.    Cardiovascular:  Negative.   Gastrointestinal: Negative.        Claims to feel that he cannot swallow and that he thinks there is a blockage in his throat  Musculoskeletal: Negative.   Skin: Negative.   Neurological: Negative.   Psychiatric/Behavioral:  Positive for depression, memory loss and suicidal ideas. The patient is nervous/anxious and has insomnia.   Blood pressure 125/87, pulse 100, temperature 98 F (36.7 C), temperature source Oral, resp. rate 18, height 5' 10"  (1.778 m), weight 65.8 kg, SpO2 100 %. Body mass index is 20.81 kg/m.  Treatment Plan Summary: Daily contact with patient to assess and evaluate symptoms and progress in treatment, Medication management, and Plan differential diagnosis includes severe major depression possibly with psychotic features versus some reflection of his severe chronic anxiety.  Possibly related to the alcohol use over benzodiazepines.  First of all he is completely focused on this belief that he cannot swallow and that therefore he has "throat cancer".  I have ordered a barium swallow and we will try and get him assessed for that.  Might need to get a speech evaluation as well.  Checking some basic labs.  Continue 15-minute checks.  Discontinue the as needed Ativan.  Continue the standing medicines for now and add low-dose fluoxetine for  depression and anxiety.  Supportive counseling and therapy.  Case reviewed with treatment team.  Observation Level/Precautions:  15 minute checks  Laboratory:  Chemistry Profile  Psychotherapy:    Medications:    Consultations:    Discharge Concerns:    Estimated LOS:  Other:     Physician Treatment Plan for Primary Diagnosis: MDD (major depressive disorder), recurrent episode, with atypical features ([place]) Long Term Goal(s): Improvement in symptoms so as ready for discharge  Short Term Goals: Ability to verbalize feelings will improve, Ability to disclose and discuss suicidal ideas, and Ability to demonstrate self-control will improve  Physician Treatment Plan for Secondary Diagnosis: Principal Problem:   MDD (major depressive disorder), recurrent episode, with atypical features ([place]) Active Problems:   Essential hypertension   PUD (peptic ulcer disease)   Malnutrition of moderate degree ([place])   Alcohol abuse  Long Term Goal(s): Improvement in symptoms so as ready for discharge  Short Term Goals: Ability to maintain clinical measurements within normal limits will improve and Compliance with prescribed medications will improve  I certify that inpatient services furnished can reasonably be expected to improve the patient's condition.    Alethia Berthold, MD 12/3/20222:53 PM

## 2021-05-07 NOTE — Progress Notes (Signed)
CSW informed by Amie Critchley, RN that TTS bough pt's IVC paperwork over to [facility] and advised once pt is medically cleared with negative COVID-19 and FLU results. Pt can admit to Room 29. Correctly pt is negative for COVID-19; (TAT 6-24HRS) but awaiting PCR Flu A&B, COVID-19. CSW will have 1st shift CSW assist and follow.     Benjaman Kindler, MSW, LCSWA 05/07/2021 12:25 AM

## 2021-05-07 NOTE — Plan of Care (Signed)
Pt is calm and cooperative. Pt has increased anxiety with many worries. Pt states he's worried that he has cancer d/t his weakness and weight loss. Pt complains about his throat hurting and having difficulty swallowing however is able to eat a granola bar, drink fluid from a cup with and without a straw. Pt states he has many stresses outside of here that stresses him out but gives no specific details. Pt states he hears voices but is unable to tell what they are saying to him. Pt also endorses SI w/o a plan while here. Pt reports he can be safe while he is here (contracts verbally for safety). Pt c/o hip pain and c/o urination issues yet was helped observed by this writer to use the bathroom with no observed issues, urine was clear and straw yellow. There did not appear to be in difficulty in urination. Pt was worries that his night medication were not going to work. Pt reassured to given them time and a chance to start working. Pt appears to slept well through the night once asleep.   Problem: Education: Goal: Emotional status will improve Outcome: Not Progressing Goal: Mental status will improve Outcome: Not Progressing

## 2021-05-07 NOTE — Plan of Care (Addendum)
Patient admitted IVC to [facility] from [facility] ED with diagnosis of worsening depression, threatening to kill himself by cutting his wrists.  Patient presents to unit via [facility].  Patient states, "I don't want to kill myself I just have so many problems I don't know what to do.Marland KitchenMarland KitchenMarland KitchenI need my Ativan." Patient currently denies suicidal ideations, homicidal ideations, audio or visual hallucinations.  Patient's affect is extremely anxious and worried, stating his main stressors are "losing my apartment if I can't pay my rent since I'm in here" and "I have throat cancer and can't eat" although no diagnosis of throat cancer found in chart.  Patient denies smoking or drug abuse but does endorse daily ETOH use "a couple beers a day."  Reports chronic neck and back pain 7/10 for which he takes Tylenol at home.  Pt reports generalized weakness and using cane at home.  Patient edentulous and has no dentures.  Pt reports poor appetite with approx 15lb weight loss the past 3 months. Denies incontinence and reports last BM yesterday.  Patient reports he lives alone in an apartment at [facility] with no support system, never married, no kids.  Lived with mother until August 13, 2012 when she died.  When asked what his strengths are or the goals he would like to work on while here pt states, "I don't have any."    Emotional support and reassurance provided throughout admission intake. Afterwards, oriented patient to unit, room and call light, reviewed POC with all questions answered and understanding verbalzied.  Ativan, Tylenol and daily meds given as ordered.  Pt compliant with all meds, swallows without difficulty and ate 50% of lunch.  Placed on soft diet due to edentulous and no dentures.  Placed on high risk fall precautions per policy and provided rolling walker for ambulation.  Denies any needs at this time.  Will continue to monitor with ongoing Q 15 minute safety checks per unit protocol.  Problem: Education: Goal:  Knowledge of [place] [facility] Education information/materials will improve 05/07/2021 1210 by Conard Novak, RN Outcome: Not Progressing 05/07/2021 08-14-07 by Conard Novak, RN Outcome: Not Progressing Goal: Emotional status will improve 05/07/2021 1210 by Conard Novak, RN Outcome: Not Progressing 05/07/2021 08/14/1207 by Conard Novak, RN Outcome: Not Progressing Goal: Mental status will improve 05/07/2021 1210 by Conard Novak, RN Outcome: Not Progressing 05/07/2021 1207/08/14 by Conard Novak, RN Outcome: Not Progressing Goal: Verbalization of understanding the information provided will improve 05/07/2021 1210 by Conard Novak, RN Outcome: Not Progressing 05/07/2021 1207/08/14 by Conard Novak, RN Outcome: Not Progressing   Problem: Activity: Goal: Interest or engagement in activities will improve 05/07/2021 1210 by Conard Novak, RN Outcome: Not Progressing 05/07/2021 08-14-1207 by Conard Novak, RN Outcome: Not Progressing Goal: Sleeping patterns will improve 05/07/2021 1210 by Conard Novak, RN Outcome: Not Progressing 05/07/2021 1207/08/14 by Conard Novak, RN Outcome: Not Progressing   Problem: Coping: Goal: Ability to verbalize frustrations and anger appropriately will improve 05/07/2021 1210 by Conard Novak, RN Outcome: Not Progressing 05/07/2021 08/14/1207 by Conard Novak, RN Outcome: Not Progressing Goal: Ability to demonstrate self-control will improve 05/07/2021 1210 by Conard Novak, RN Outcome: Not Progressing 05/07/2021 08-14-1207 by Conard Novak, RN Outcome: Not Progressing   Problem: Health Behavior/Discharge Planning: Goal: Identification of resources available to assist in meeting health care needs will improve 05/07/2021 1210 by Conard Novak, RN Outcome: Not Progressing 05/07/2021 08-14-07 by Conard Novak, RN  Outcome: Not Progressing Goal: Compliance with treatment plan for underlying cause of condition will improve 05/07/2021  1210 by Conard Novak, RN Outcome: Not Progressing 05/07/2021 1209 by Conard Novak, RN Outcome: Not Progressing   Problem: Physical Regulation: Goal: Ability to maintain clinical measurements within normal limits will improve 05/07/2021 1210 by Conard Novak, RN Outcome: Not Progressing 05/07/2021 1209 by Conard Novak, RN Outcome: Not Progressing   Problem: Safety: Goal: Periods of time without injury will increase 05/07/2021 1210 by Conard Novak, RN Outcome: Not Progressing 05/07/2021 1209 by Conard Novak, RN Outcome: Not Progressing

## 2021-05-07 NOTE — ED Notes (Signed)
Patient is resting comfortably. Sleeping and in NAD

## 2021-05-07 NOTE — ED Notes (Signed)
Report called to [facility] staff regarding pt transfer to [facility].

## 2021-05-07 NOTE — ED Notes (Signed)
Belongings obtained from locker 14.

## 2021-05-07 NOTE — ED Notes (Signed)
[name] [phone]) with [facility] called and voicemail left for arrangement of transport to [facility] .

## 2021-05-07 NOTE — Tx Team (Signed)
Initial Treatment Plan 05/07/2021 2:25 PM [name] TJL:[id number]    PATIENT STRESSORS: Fear of loss of apartment if unable to pay rent. Poor appetite, weight loss due to "throat cancer"   PATIENT STRENGTHS: Patient states he has none.   PATIENT IDENTIFIED PROBLEMS: Loss of apartment if unable to pay rent.  Throat cancer.                   DISCHARGE CRITERIA:  Ability to meet basic life and health needs Adequate post-discharge living arrangements Improved stabilization in mood, thinking, and/or behavior  PRELIMINARY DISCHARGE PLAN: Outpatient therapy Return to previous living arrangement  PATIENT/FAMILY INVOLVEMENT: This treatment plan has been presented to and reviewed with the patient, Anthony Lamb.  The patient and has been given the opportunity to ask questions and make suggestions.  Conard Novak, RN 05/07/2021, 2:25 PM

## 2021-05-07 NOTE — ED Notes (Signed)
Patient ambulatory with steady gait and leaving with [employer]. VSS upon discharge.

## 2021-05-07 NOTE — Progress Notes (Signed)
Patient states he has not voided since arriving to unit at 1040.  Last recorded void was 264m in ED this am at 0600.   Bladder scan done at this time = 828m  Patient encouraged to drink more fluids and given 24073mf water.

## 2021-05-07 NOTE — ED Notes (Signed)
Pocket knife and keys found in pt belongings and given to security at this time.

## 2021-05-07 NOTE — Progress Notes (Signed)
Patient total meal intake today 75% breakfast, 50% lunch and 25% dinner with fluid intake 230m at each meal.

## 2021-05-07 NOTE — ED Notes (Signed)
Valuables obtained from security. All belongings given to [employer] who is transporting pt.

## 2021-05-07 NOTE — [facility] Suicide Risk Assessment (Signed)
[facility] Admission Suicide Risk Assessment   Nursing information obtained from:  Patient Demographic factors:  Male, Age 65 or older, Living alone, Unemployed, Low socioeconomic status Current Mental Status:  NA Loss Factors:  Financial problems / change in socioeconomic status, Decline in physical health Historical Factors:  Impulsivity Risk Reduction Factors:  NA (None)  Total Time spent with patient: 1 hour Principal Problem: MDD (major depressive disorder), recurrent episode, with atypical features ([place]) Diagnosis:  Principal Problem:   MDD (major depressive disorder), recurrent episode, with atypical features ([place]) Active Problems:   Essential hypertension   PUD (peptic ulcer disease)   Malnutrition of moderate degree ([place])   Alcohol abuse  Subjective Data: Patient seen and chart reviewed.  65 year old man with a history of chronic anxiety came to the emergency room stating he was going to kill himself by cutting his wrist.  Patient is extremely anxious right now.  Not threatening to kill himself but feeling like he is going to die.  Currently cooperative with basic treatment plan.  Seems to potentially be psychotic about his medical problems.  Continued Clinical Symptoms:  Alcohol Use Disorder Identification Test Final Score (AUDIT): 4 The "Alcohol Use Disorders Identification Test", Guidelines for Use in Primary Care, Second Edition.  World Pharmacologist [facility]). Score between 0-7:  no or low risk or alcohol related problems. Score between 8-15:  moderate risk of alcohol related problems. Score between 16-19:  high risk of alcohol related problems. Score 20 or above:  warrants further diagnostic evaluation for alcohol dependence and treatment.   CLINICAL FACTORS:   Severe Anxiety and/or Agitation Depression:   Comorbid alcohol abuse/dependence Alcohol/Substance Abuse/Dependencies   Musculoskeletal: Strength & Muscle Tone: decreased and atrophy Gait & Station:  unsteady Patient leans: Front  Psychiatric Specialty Exam:  Presentation  General Appearance: Casual  Eye Contact:Good  Speech:Clear and Coherent  Speech Volume:Normal  Handedness:Right   Mood and Affect  Mood:Euthymic  Affect:Congruent; Appropriate   Thought Process  Thought Processes:Coherent; Goal Directed  Descriptions of Associations:Intact  Orientation:Full (Time, Place and Person)  Thought Content:Logical  History of Schizophrenia/Schizoaffective disorder:No  Duration of Psychotic Symptoms:Less than six months  Hallucinations:No data recorded Ideas of Reference:None  Suicidal Thoughts:No data recorded Homicidal Thoughts:No data recorded  Sensorium  Memory:Immediate Good; Recent Good; Remote Good  Judgment:Good  Insight:Good   Executive Functions  Concentration:Good  Attention Span:Good  [place]  Language:Good   Psychomotor Activity  Psychomotor Activity:No data recorded  Assets  Assets:Communication Skills; Desire for Improvement; Financial Resources/Insurance; Housing; Leisure Time; Resilience   Sleep  Sleep:No data recorded   Physical Exam: Physical Exam Vitals and nursing note reviewed.  Constitutional:      Appearance: He is ill-appearing.  HENT:     Head: Normocephalic and atraumatic.     Mouth/Throat:     Pharynx: Oropharynx is clear.  Eyes:     Pupils: Pupils are equal, round, and reactive to light.  Cardiovascular:     Rate and Rhythm: Normal rate and regular rhythm.  Pulmonary:     Effort: Pulmonary effort is normal.     Breath sounds: Normal breath sounds.  Abdominal:     General: Abdomen is flat.     Palpations: Abdomen is soft.  Musculoskeletal:        General: Normal range of motion.  Skin:    General: Skin is warm and dry.  Neurological:     General: No focal deficit present.     Mental Status: He is  alert. Mental status is at baseline.  Psychiatric:        Attention  and Perception: He is inattentive.        Mood and Affect: Mood is anxious and depressed.        Speech: Speech is tangential.        Behavior: Behavior is agitated. Behavior is not aggressive.        Thought Content: Thought content is paranoid.        Cognition and Memory: Cognition is impaired.        Judgment: Judgment is inappropriate.   Review of Systems  Constitutional:  Positive for malaise/fatigue and weight loss.  HENT: Negative.    Eyes: Negative.   Respiratory: Negative.    Cardiovascular: Negative.   Gastrointestinal: Negative.        Patient claims to be unable to swallow foods.  Musculoskeletal: Negative.   Skin: Negative.   Neurological: Negative.   Psychiatric/Behavioral:  Positive for depression and suicidal ideas. Negative for hallucinations and substance abuse. The patient is nervous/anxious and has insomnia.   Blood pressure 125/87, pulse 100, temperature 98 F (36.7 C), temperature source Oral, resp. rate 18, height 5' 10"  (1.778 m), weight 65.8 kg, SpO2 100 %. Body mass index is 20.81 kg/m.   COGNITIVE FEATURES THAT CONTRIBUTE TO RISK:  Closed-mindedness, Loss of executive function, and Thought constriction (tunnel vision)    SUICIDE RISK:   Mild:  Suicidal ideation of limited frequency, intensity, duration, and specificity.  There are no identifiable plans, no associated intent, mild dysphoria and related symptoms, good self-control (both objective and subjective assessment), few other risk factors, and identifiable protective factors, including available and accessible social support.  PLAN OF CARE: Continue 15-minute checks.  Continue current medicine.  Labs ordered.  Engage in individual and group therapy.  Full assessment by full treatment team.  Ongoing assessment Signs and Symptoms of COVID-19 (e.g., fever, dry cough, and/or SOB), no diagnosis made (assign the appropriate code(s)):    Marland Kitchen  Reassess dangerousness prior to discharge    [  Cough Salome.Blare  ]    [   Shortness of breath M78.67  ]   I certify that inpatient services furnished can reasonably be expected to improve the patient's condition.   Alethia Berthold, MD 05/07/2021, 2:49 PM

## 2021-05-08 MED ORDER — SUCRALFATE 1 GM/10ML PO SUSP
1.0000 g | Freq: Three times a day (TID) | ORAL | Status: DC
  Administered 2021-05-08 – 2021-05-20 (×48): 1 g via ORAL
  Filled 2021-05-08 (×51): qty 10

## 2021-05-08 MED ORDER — ENSURE ENLIVE PO LIQD
237.0000 mL | Freq: Three times a day (TID) | ORAL | Status: DC
  Administered 2021-05-08 – 2021-05-20 (×33): 237 mL via ORAL

## 2021-05-08 NOTE — Progress Notes (Signed)
[facility] MD Progress Note  05/08/2021 2:41 PM Anthony Lamb  MRN:  [id number] Subjective: Follow-up for this patient with depression and anxiety and a history of past alcohol abuse.  Patient looks better today slightly.  He was not quite as agitatedly anxious as yesterday but he still complains about the same thing.  Still complains that he thinks he is going to lose his apartment because he cannot pay his rent today.  Still thinks that he has something stuck in his throat that is keeping him from swallowing.  We were not able to do the barium swallow yesterday because no staff were available. Principal Problem: MDD (major depressive disorder), recurrent episode, with atypical features ([place]) Diagnosis: Principal Problem:   MDD (major depressive disorder), recurrent episode, with atypical features ([place]) Active Problems:   Essential hypertension   PUD (peptic ulcer disease)   Malnutrition of moderate degree ([place])   Alcohol abuse  Total Time spent with patient: 30 minutes  Past Psychiatric History: Past history of depression and anxiety mostly any alcohol abuse in the past.  Only recently stopped drinking really.  Past Medical History:  Past Medical History:  Diagnosis Date   Alcohol abuse, in remission    Anal fissure    Anemia    Ankylosing spondylitis (HCC)    Anxiety    Arthritis    Chronic diarrhea    Chronic headaches    Chronic pain    Colitis    Colon polyps    Depression    Esophagitis    Gastric polyps    hyperplastic and fundic gland   Gastritis    GERD (gastroesophageal reflux disease)    Hypertension    IBS (irritable bowel syndrome)    Poor dentition    SIADH (syndrome of inappropriate ADH production) ([place])     Past Surgical History:  Procedure Laterality Date   BIOPSY  11/26/2018   Procedure: BIOPSY;  Surgeon: Gatha Mayer, MD;  Location: [facility];  Service: Endoscopy;;   COLONOSCOPY W/ BIOPSIES     COLONOSCOPY WITH PROPOFOL N/A 11/26/2018   Procedure:  COLONOSCOPY WITH PROPOFOL;  Surgeon: Gatha Mayer, MD;  Location: [facility];  Service: Endoscopy;  Laterality: N/A;   ESOPHAGOGASTRODUODENOSCOPY     ESOPHAGOGASTRODUODENOSCOPY (EGD) WITH PROPOFOL N/A 11/26/2018   Procedure: ESOPHAGOGASTRODUODENOSCOPY (EGD) WITH PROPOFOL;  Surgeon: Gatha Mayer, MD;  Location: [facility];  Service: Endoscopy;  Laterality: N/A;   HEMOSTASIS CLIP PLACEMENT  11/26/2018   Procedure: HEMOSTASIS CLIP PLACEMENT;  Surgeon: Gatha Mayer, MD;  Location: [facility];  Service: Endoscopy;;   HERNIA REPAIR Bilateral    HOT HEMOSTASIS N/A 11/26/2018   Procedure: HOT HEMOSTASIS (ARGON PLASMA COAGULATION/BICAP);  Surgeon: Gatha Mayer, MD;  Location[facility];  Service: Endoscopy;  Laterality: N/A;   OPEN REDUCTION INTERNAL FIXATION (ORIF) DISTAL RADIAL FRACTURE Right 02/11/2018   Procedure: OPEN REDUCTION INTERNAL FIXATION (ORIF) DISTAL RADIAL FRACTURE;  Surgeon: Milly Jakob, MD;  Location: [place];  Service: Orthopedics;  Laterality: Right;   POLYPECTOMY  11/26/2018   Procedure: POLYPECTOMY;  Surgeon: Gatha Mayer, MD;  Location: [facility];  Service: Endoscopy;;   TONSILLECTOMY     Family History:  Family History  Problem Relation Age of Onset   Anxiety disorder Mother    Congestive Heart Failure Mother    Crohn's disease Mother    Colon cancer Mother 53   Arthritis Father    High blood pressure Father    Crohn's disease Father  Family Psychiatric  History: See previous Social History:  Social History   Substance and Sexual Activity  Alcohol Use Not Currently   Comment: 2 25 oz of beer a night     Social History   Substance and Sexual Activity  Drug Use Yes   Types: Marijuana    Social History   Socioeconomic History   Marital status: Single    Spouse name: Not on file   Number of children: 0   Years of education: Not on file   Highest education level: Not on file  Occupational History   Not on file  Tobacco Use   Smoking  status: Former    Types: Cigarettes    Quit date: 2014    Years since quitting: 8.9   Smokeless tobacco: Never  Vaping Use   Vaping Use: Never used  Substance and Sexual Activity   Alcohol use: Not Currently    Comment: 2 25 oz of beer a night   Drug use: Yes    Types: Marijuana   Sexual activity: Not Currently  Other Topics Concern   Not on file  Social History Narrative   HSG. Long - term disability - unable to work. Lived with his mother in her house - she died 05-13-2023 -    Lives in apartment   Has case worker   Social Determinants of Radio broadcast assistant Strain: Not on file  Food Insecurity: Not on file  Transportation Needs: Not on file  Physical Activity: Not on file  Stress: Not on file  Social Connections: Not on file   Additional Social History:                         Sleep: Fair  Appetite:  Fair  Current Medications: Current Facility-Administered Medications  Medication Dose Route Frequency Provider Last Rate Last Admin   acetaminophen (TYLENOL) tablet 650 mg  650 mg Oral Q6H PRN Parks Ranger, DO   650 mg at 05/08/21 1023   alum & mag hydroxide-simeth (MAALOX/MYLANTA) 200-200-20 MG/5ML suspension 30 mL  30 mL Oral Q4H PRN Parks Ranger, DO       feeding supplement (ENSURE ENLIVE / ENSURE PLUS) liquid 237 mL  237 mL Oral TID BM Giara Mcgaughey T, MD   237 mL at 05/08/21 1020   FLUoxetine (PROZAC) capsule 10 mg  10 mg Oral Daily Yelitza Reach T, MD   10 mg at 05/08/21 0938   lisinopril (ZESTRIL) tablet 10 mg  10 mg Oral Daily Parks Ranger, DO   10 mg at 05/08/21 0034   LORazepam (ATIVAN) tablet 1 mg  1 mg Oral TID Parks Ranger, DO   1 mg at 05/08/21 9179   magnesium hydroxide (MILK OF MAGNESIA) suspension 30 mL  30 mL Oral Daily PRN Parks Ranger, DO       multivitamin with minerals tablet 1 tablet  1 tablet Oral Daily Parks Ranger, DO   1 tablet at 05/08/21 1505   ondansetron  (ZOFRAN-ODT) disintegrating tablet 4 mg  4 mg Oral Q4H PRN Parks Ranger, DO       pantoprazole (PROTONIX) EC tablet 40 mg  40 mg Oral Daily Collier Monica, Madie Reno, MD   40 mg at 05/08/21 0938   propranolol (INDERAL) tablet 10 mg  10 mg Oral BID Alaze Garverick, Madie Reno, MD   10 mg at 05/08/21 0938   QUEtiapine (SEROQUEL) tablet 300 mg  300  mg Oral QHS Malaisha Silliman T, MD   300 mg at 05/07/21 2140   sucralfate (CARAFATE) 1 GM/10ML suspension 1 g  1 g Oral TID WC & HS Tanna Loeffler T, MD       traZODone (DESYREL) tablet 50 mg  50 mg Oral QHS Parks Ranger, DO   50 mg at 05/07/21 2139   verapamil (CALAN-SR) CR tablet 240 mg  240 mg Oral Daily Parks Ranger, DO   240 mg at 05/08/21 8416   zolpidem (AMBIEN) tablet 10 mg  10 mg Oral QHS Tyjon Bowen, Madie Reno, MD   10 mg at 05/07/21 2140    Lab Results:  Results for orders placed or performed during the hospital encounter of 05/07/21 (from the past 48 hour(s))  TSH     Status: Abnormal   Collection Time: 05/07/21  5:09 PM  Result Value Ref Range   TSH 0.321 (L) 0.350 - 4.500 uIU/mL    Comment: Performed by a 3rd Generation assay with a functional sensitivity of <=0.01 uIU/mL. Performed at [facility], [address] [place], [place] [address]   Lipid panel     Status: None   Collection Time: 05/07/21  5:09 PM  Result Value Ref Range   Cholesterol 183 0 - 200 mg/dL   Triglycerides 79 <150 mg/dL   HDL 69 >40 mg/dL   Total CHOL/HDL Ratio 2.7 RATIO   VLDL 16 0 - 40 mg/dL   LDL Cholesterol 98 0 - 99 mg/dL    Comment:        Total Cholesterol/HDL:CHD Risk Coronary Heart Disease Risk Table                     Men   Women  1/2 Average Risk   3.4   3.3  Average Risk       5.0   4.4  2 X Average Risk   9.6   7.1  3 X Average Risk  23.4   11.0        Use the calculated Patient Ratio above and the CHD Risk Table to determine the patient's CHD Risk.        ATP III CLASSIFICATION (LDL):  <100     mg/dL   Optimal  100-129   mg/dL   Near or Above                    Optimal  130-159  mg/dL   Borderline  160-189  mg/dL   High  >190     mg/dL   Very High Performed at [facility], [address] [place], [place] [address]     Blood Alcohol level:  Lab Results  Component Value Date   [facility] <10 05/03/2021   ETH <10 93/23/5573    Metabolic Disorder Labs: Lab Results  Component Value Date   HGBA1C 5.3 11/12/2016   MPG 105 11/12/2016   No results found for: PROLACTIN Lab Results  Component Value Date   CHOL 183 05/07/2021   TRIG 79 05/07/2021   HDL 69 05/07/2021   CHOLHDL 2.7 05/07/2021   VLDL 16 05/07/2021   LDLCALC 98 05/07/2021   LDLCALC 108 (H) 11/12/2016    Physical Findings: AIMS:  , ,  ,  ,    CIWA:    COWS:     Musculoskeletal: Strength & Muscle Tone: within normal limits Gait & Station: unsteady Patient leans: N/A  Psychiatric Specialty Exam:  Presentation  General  Appearance: Casual  Eye Contact:Good  Speech:Clear and Coherent  Speech Volume:Normal  Handedness:Right   Mood and Affect  Mood:Euthymic  Affect:Congruent; Appropriate   Thought Process  Thought Processes:Coherent; Goal Directed  Descriptions of Associations:Intact  Orientation:Full (Time, Place and Person)  Thought Content:Logical  History of Schizophrenia/Schizoaffective disorder:No  Duration of Psychotic Symptoms:Less than six months  Hallucinations:No data recorded Ideas of Reference:None  Suicidal Thoughts:No data recorded Homicidal Thoughts:No data recorded  Sensorium  Memory:Immediate Good; Recent Good; Remote Good  Judgment:Good  Insight:Good   Executive Functions  Concentration:Good  Attention Span:Good  Dana  Language:Good   Psychomotor Activity  Psychomotor Activity:No data recorded  Assets  Assets:Communication Skills; Desire for Improvement; Financial Resources/Insurance; Housing; Leisure Time; Resilience   Sleep   Sleep:No data recorded   Physical Exam: Physical Exam Vitals and nursing note reviewed.  Constitutional:      Appearance: He is ill-appearing.  HENT:     Head: Normocephalic and atraumatic.     Mouth/Throat:     Pharynx: Oropharynx is clear.  Eyes:     Pupils: Pupils are equal, round, and reactive to light.  Cardiovascular:     Rate and Rhythm: Normal rate and regular rhythm.  Pulmonary:     Effort: Pulmonary effort is normal.     Breath sounds: Normal breath sounds.  Abdominal:     General: Abdomen is flat.     Palpations: Abdomen is soft.  Musculoskeletal:        General: Normal range of motion.  Skin:    General: Skin is warm and dry.  Neurological:     General: No focal deficit present.     Mental Status: He is alert. Mental status is at baseline.  Psychiatric:        Attention and Perception: Attention normal.        Mood and Affect: Mood is anxious and depressed.        Speech: Speech normal.        Behavior: Behavior is cooperative.        Thought Content: Thought content normal.        Cognition and Memory: Memory is impaired.   Review of Systems  Constitutional: Negative.   HENT: Negative.    Eyes: Negative.   Respiratory: Negative.    Cardiovascular: Negative.   Gastrointestinal: Negative.        Continues to complain that he cannot eat because he feels like things get stuck in his throat  Musculoskeletal: Negative.   Skin: Negative.   Neurological: Negative.   Psychiatric/Behavioral:  Positive for depression. The patient is nervous/anxious.   Blood pressure 128/73, pulse 79, temperature 97.8 F (36.6 C), resp. rate 20, height 5' 10"  (1.778 m), weight 65.8 kg, SpO2 99 %. Body mass index is 20.81 kg/m.   Treatment Plan Summary: Plan no change to current medicine.  Encourage patient to be up out of bed.  Once again he is trying to ask for increased doses of controlled substances which I told him we would not be able to do for now.  Gave him some  feedback that he seemed a little less anxious than yesterday.  Labs reviewed.  Lipid panel pretty unremarkable.  TSH only slightly low.  Alethia Berthold, MD 05/08/2021, 2:41 PM

## 2021-05-08 NOTE — Plan of Care (Addendum)
Patient presents A&O x4 with high anxiety, worried about "losing my apartment if my rent is late" and having "throat cancer" although he states he has never been diagnosed with cancer "but I know I have it because I'm so weak and can't swallow."  Pt compliant with all meds, able to swallow pills whole without difficulty, eating 50-75% of meals and drinking fluids well.  Patient ruminates on "all my problems" and requires constant reassurance and emotional support with little to no improvement in anxiety level rating it 10/10 as well as depression, denies SI/HI or AVH.  Ativan, Propanolol and Prozac given as scheduled.  VSS. Reports chronic back and neck pain 8/10 with relief reported after Tylenol.  Will continue to monitor with Q 15 min safety checks.  Problem: Activity: Goal: Sleeping patterns will improve Outcome: Progressing   Problem: Health Behavior/Discharge Planning: Goal: Compliance with treatment plan for underlying cause of condition will improve Outcome: Progressing   Problem: Education: Goal: Emotional status will improve Outcome: Not Progressing Goal: Mental status will improve Outcome: Not Progressing Goal: Verbalization of understanding the information provided will improve Outcome: Not Progressing

## 2021-05-08 NOTE — Progress Notes (Signed)
Patient ID: Anthony Lamb, male   DOB: Aug 16, 1955, 65 y.o.   MRN: [id number] Now that I think of it, the TSH being a little bit low is definitely a change from his previous readings.  If he had some kind of nodule in his thyroid that could explain the bump in his throat keeping him from swallowing.  I will go ahead and order thyroid hormone levels as well.  Depending on other results might want to get a thyroid ultrasound.
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
## 2021-05-08 NOTE — Progress Notes (Signed)
Patient reports having 2 diarrhea stools.  MD notified.  No new orders received.  Will continue to monitor.

## 2021-05-09 ENCOUNTER — Inpatient Hospital Stay: Payer: Medicaid Other

## 2021-05-09 DIAGNOSIS — E440 Moderate protein-calorie malnutrition: Secondary | ICD-10-CM

## 2021-05-09 DIAGNOSIS — I10 Essential (primary) hypertension: Secondary | ICD-10-CM

## 2021-05-09 DIAGNOSIS — K279 Peptic ulcer, site unspecified, unspecified as acute or chronic, without hemorrhage or perforation: Secondary | ICD-10-CM

## 2021-05-09 LAB — HEMOGLOBIN A1C
Hgb A1c MFr Bld: 5.4 % (ref 4.8–5.6)
Mean Plasma Glucose: 108 mg/dL

## 2021-05-09 LAB — T4: T4, Total: 5.1 ug/dL (ref 4.5–12.0)

## 2021-05-09 LAB — T3 UPTAKE: T3 Uptake Ratio: 28 % (ref 24–39)

## 2021-05-09 MED ORDER — LOPERAMIDE HCL 2 MG PO CAPS
2.0000 mg | ORAL_CAPSULE | Freq: Once | ORAL | Status: AC
  Administered 2021-05-09: 2 mg via ORAL
  Filled 2021-05-09: qty 1

## 2021-05-09 MED ORDER — BACITRACIN-NEOMYCIN-POLYMYXIN OINTMENT TUBE
TOPICAL_OINTMENT | Freq: Two times a day (BID) | CUTANEOUS | Status: DC
  Administered 2021-05-11 – 2021-05-18 (×4): 1 via TOPICAL
  Filled 2021-05-09: qty 14.17

## 2021-05-09 NOTE — Progress Notes (Signed)
Recreation Therapy Notes  Date: 05/09/2021  Time: 1:00pm     Location: Craft room    Behavioral response: N/A   Intervention Topic: Time Management    Discussion/Intervention: Patient did not attend group.   Clinical Observations/Feedback:  Patient did not attend group.    Loraina Stauffer LRT/CTRS          Xayden Linsey 05/09/2021 2:39 PM

## 2021-05-09 NOTE — Group Note (Signed)
[facility] LCSW Group Therapy Note    Group Date: 05/09/2021 Start Time: 1330 End Time: 1345  Type of Therapy and Topic:  Group Therapy:  Overcoming Obstacles  Participation Level:  [facility] PARTICIPATION LEVEL: Did Not Attend    Description of Group:   In this group patients will be encouraged to explore what they see as obstacles to their own wellness and recovery. They will be guided to discuss their thoughts, feelings, and behaviors related to these obstacles. The group will process together ways to cope with barriers, with attention given to specific choices patients can make. Each patient will be challenged to identify changes they are motivated to make in order to overcome their obstacles. This group will be process-oriented, with patients participating in exploration of their own experiences as well as giving and receiving support and challenge from other group members.  Therapeutic Goals: 1. Patient will identify personal and current obstacles as they relate to admission. 2. Patient will identify barriers that currently interfere with their wellness or overcoming obstacles.  3. Patient will identify feelings, thought process and behaviors related to these barriers. 4. Patient will identify two changes they are willing to make to overcome these obstacles:    Summary of Patient Progress   X   Therapeutic Modalities:   Cognitive Behavioral Therapy Solution Focused Therapy Motivational Interviewing Relapse Prevention Therapy   Niala Stcharles A [place], LCSWA

## 2021-05-09 NOTE — Progress Notes (Addendum)
Patient presents to nurses station anxious, wringing hands stating, "I'm so worried I'm going to lose my apartment if I don't pay my rent since it's late." Consent obtained to contact patient's support worker, Selinda Eon @ [phone], and phone call placed. Informed her that patient is extremely anxious regarding his rent payment being late and fearful he will lose his apartment.  Sharyn Lull states she will contact his payee support person, Pamala Hurry, to pay his rent.  Informed patient of the above but patient remains anxious that he will now have late fees.  Reassured patient and emotional support given with no apparent decrease in level of anxiety.  Will administer scheduled Ativan at this time.

## 2021-05-09 NOTE — Plan of Care (Signed)
Patient presents A&O x4 with high anxious stating, "I'm so worried, I have cancer all through me.. I know it because I'm weak and can't swallow or eat.  I know it's cancer they just haven't diagnosed me yet." Pt compliant with all meds, able to swallow pills whole without difficulty, ate 65% of breakfast and drinking fluids well. Patient requires constant reassurance and emotional support with little to no improvement in anxiety level rating it 10/10 as well as depression.  Denies SI/HI or AVH.  Ativan, Propanolol and Prozac given as scheduled.  VSS. Reports chronic back and neck pain 8/10 with relief reported after Tylenol.  Will continue to monitor with Q 15 min safety checks.  Problem: Education: Goal: Mental status will improve Outcome: Progressing   Problem: Activity: Goal: Sleeping patterns will improve Outcome: Progressing   Problem: Health Behavior/Discharge Planning: Goal: Identification of resources available to assist in meeting health care needs will improve Outcome: Progressing Goal: Compliance with treatment plan for underlying cause of condition will improve Outcome: Progressing   Problem: Education: Goal: Emotional status will improve Outcome: Not Progressing Goal: Verbalization of understanding the information provided will improve Outcome: Not Progressing   Problem: Activity: Goal: Interest or engagement in activities will improve Outcome: Not Progressing

## 2021-05-09 NOTE — BH IP Treatment Plan (Signed)
Interdisciplinary Treatment and Diagnostic Plan Update  05/09/2021 Time of Session: 10:00AM Anthony Lamb MRN: [id number]  Principal Diagnosis: MDD (major depressive disorder), recurrent episode, with atypical features ([place])  Secondary Diagnoses: Principal Problem:   MDD (major depressive disorder), recurrent episode, with atypical features ([place]) Active Problems:   Essential hypertension   PUD (peptic ulcer disease)   Malnutrition of moderate degree ([place])   Alcohol abuse   Current Medications:  Current Facility-Administered Medications  Medication Dose Route Frequency Provider Last Rate Last Admin   acetaminophen (TYLENOL) tablet 650 mg  650 mg Oral Q6H PRN Parks Ranger, DO   650 mg at 05/09/21 0736   alum & mag hydroxide-simeth (MAALOX/MYLANTA) 200-200-20 MG/5ML suspension 30 mL  30 mL Oral Q4H PRN Parks Ranger, DO       feeding supplement (ENSURE ENLIVE / ENSURE PLUS) liquid 237 mL  237 mL Oral TID BM Clapacs, John T, MD   237 mL at 05/09/21 0951   FLUoxetine (PROZAC) capsule 10 mg  10 mg Oral Daily Clapacs, Madie Reno, MD   10 mg at 05/09/21 0950   lisinopril (ZESTRIL) tablet 10 mg  10 mg Oral Daily Parks Ranger, DO   10 mg at 05/09/21 0950   LORazepam (ATIVAN) tablet 1 mg  1 mg Oral TID Parks Ranger, DO   1 mg at 05/09/21 0950   magnesium hydroxide (MILK OF MAGNESIA) suspension 30 mL  30 mL Oral Daily PRN Parks Ranger, DO       multivitamin with minerals tablet 1 tablet  1 tablet Oral Daily Parks Ranger, DO   1 tablet at 05/09/21 0951   ondansetron (ZOFRAN-ODT) disintegrating tablet 4 mg  4 mg Oral Q4H PRN Parks Ranger, DO       pantoprazole (PROTONIX) EC tablet 40 mg  40 mg Oral Daily Clapacs, John T, MD   40 mg at 05/09/21 0951   propranolol (INDERAL) tablet 10 mg  10 mg Oral BID Clapacs, John T, MD   10 mg at 05/09/21 0950   QUEtiapine (SEROQUEL) tablet 300 mg  300 mg Oral QHS Clapacs, John T, MD   300  mg at 05/08/21 2101   sucralfate (CARAFATE) 1 GM/10ML suspension 1 g  1 g Oral TID WC & HS Clapacs, John T, MD   1 g at 05/09/21 0730   traZODone (DESYREL) tablet 50 mg  50 mg Oral QHS Parks Ranger, DO   50 mg at 05/08/21 2104   verapamil (CALAN-SR) CR tablet 240 mg  240 mg Oral Daily Parks Ranger, DO   240 mg at 05/09/21 0950   zolpidem (AMBIEN) tablet 10 mg  10 mg Oral QHS Clapacs, Madie Reno, MD   10 mg at 05/08/21 2100   PTA Medications: Medications Prior to Admission  Medication Sig Dispense Refill Last Dose   acetaminophen (TYLENOL) 500 MG tablet Take 500-1,000 mg by mouth every 6 (six) hours as needed (for pain).      chlordiazePOXIDE (LIBRIUM) 25 MG capsule 33m PO TID x 1D, then 25-526mPO BID X 1D, then 25-5059mO QD X 1D (Patient not taking: Reported on 05/03/2021) 10 capsule 0    diclofenac Sodium (VOLTAREN) 1 % GEL Apply 2 g topically 4 (four) times daily as needed (to painful sites). (Patient not taking: Reported on 05/03/2021)      famotidine (PEPCID) 20 MG tablet Take 1 tablet (20 mg total) by mouth 2 (two) times daily. (Patient not taking: Reported  on 04/26/2021) 30 tablet 0    lisinopril (PRINIVIL,ZESTRIL) 10 MG tablet Take 1 tablet (10 mg total) by mouth daily. 30 tablet 0    LORazepam (ATIVAN) 1 MG tablet Take 1 mg by mouth 3 (three) times daily.      Multiple Vitamin (MULTIVITAMIN WITH MINERALS) TABS tablet Take 1 tablet by mouth daily. (May buy over the counter) (Patient taking differently: Take 1 tablet by mouth daily.) 1 tablet 0    ondansetron (ZOFRAN ODT) 4 MG disintegrating tablet Take 1 tablet (4 mg total) by mouth every 4 (four) hours as needed for nausea or vomiting. (Patient not taking: Reported on 05/03/2021) 20 tablet 0    ondansetron (ZOFRAN-ODT) 4 MG disintegrating tablet Take 1 tablet (4 mg total) by mouth every 4 (four) hours as needed for nausea or vomiting. (Patient not taking: Reported on 05/03/2021) 20 tablet 0    pantoprazole (PROTONIX) 40  MG tablet Take 1 tablet (40 mg total) by mouth daily. (Patient taking differently: Take 40 mg by mouth in the morning.) 30 tablet 11    propranolol (INDERAL) 10 MG tablet Take 10 mg by mouth 2 (two) times daily.      QUEtiapine (SEROQUEL) 300 MG tablet Take 300 mg by mouth at bedtime.      sucralfate (CARAFATE) 1 g tablet Take 1 g by mouth 4 (four) times daily.      sucralfate (CARAFATE) 1 GM/10ML suspension Take 10 mLs (1 g total) by mouth 4 (four) times daily -  with meals and at bedtime. (Patient not taking: Reported on 05/03/2021) 420 mL 0    tiZANidine (ZANAFLEX) 4 MG tablet Take 4 mg by mouth 3 (three) times daily as needed for muscle spasms.      verapamil (CALAN-SR) 240 MG CR tablet Take 240 mg by mouth daily.  2    zolpidem (AMBIEN) 5 MG tablet Take 5 mg by mouth at bedtime.       Patient Stressors:    Patient Strengths:    Treatment Modalities: Medication Management, Group therapy, Case management,  1 to 1 session with clinician, Psychoeducation, Recreational therapy.   Physician Treatment Plan for Primary Diagnosis: MDD (major depressive disorder), recurrent episode, with atypical features ([place]) Long Term Goal(s): Improvement in symptoms so as ready for discharge   Short Term Goals: Ability to maintain clinical measurements within normal limits will improve Compliance with prescribed medications will improve Ability to verbalize feelings will improve Ability to disclose and discuss suicidal ideas Ability to demonstrate self-control will improve  Medication Management: Evaluate patient's response, side effects, and tolerance of medication regimen.  Therapeutic Interventions: 1 to 1 sessions, Unit Group sessions and Medication administration.  Evaluation of Outcomes: Not Met  Physician Treatment Plan for Secondary Diagnosis: Principal Problem:   MDD (major depressive disorder), recurrent episode, with atypical features ([place]) Active Problems:   Essential hypertension    PUD (peptic ulcer disease)   Malnutrition of moderate degree ([place])   Alcohol abuse  Long Term Goal(s): Improvement in symptoms so as ready for discharge   Short Term Goals: Ability to maintain clinical measurements within normal limits will improve Compliance with prescribed medications will improve Ability to verbalize feelings will improve Ability to disclose and discuss suicidal ideas Ability to demonstrate self-control will improve     Medication Management: Evaluate patient's response, side effects, and tolerance of medication regimen.  Therapeutic Interventions: 1 to 1 sessions, Unit Group sessions and Medication administration.  Evaluation of Outcomes: Not Met   RN Treatment  Plan for Primary Diagnosis: MDD (major depressive disorder), recurrent episode, with atypical features ([place]) Long Term Goal(s): Knowledge of disease and therapeutic regimen to maintain health will improve  Short Term Goals: Ability to remain free from injury will improve, Ability to verbalize frustration and anger appropriately will improve, Ability to demonstrate self-control, Ability to participate in decision making will improve, Ability to verbalize feelings will improve, Ability to disclose and discuss suicidal ideas, Ability to identify and develop effective coping behaviors will improve, and Compliance with prescribed medications will improve  Medication Management: RN will administer medications as ordered by provider, will assess and evaluate patient's response and provide education to patient for prescribed medication. RN will report any adverse and/or side effects to prescribing provider.  Therapeutic Interventions: 1 on 1 counseling sessions, Psychoeducation, Medication administration, Evaluate responses to treatment, Monitor vital signs and CBGs as ordered, Perform/monitor CIWA, COWS, AIMS and Fall Risk screenings as ordered, Perform wound care treatments as ordered.  Evaluation of Outcomes: Not  Met   LCSW Treatment Plan for Primary Diagnosis: MDD (major depressive disorder), recurrent episode, with atypical features ([place]) Long Term Goal(s): Safe transition to appropriate next level of care at discharge, Engage patient in therapeutic group addressing interpersonal concerns.  Short Term Goals: Engage patient in aftercare planning with referrals and resources, Increase social support, Increase ability to appropriately verbalize feelings, Increase emotional regulation, Facilitate acceptance of mental health diagnosis and concerns, and Identify triggers associated with mental health/substance abuse issues  Therapeutic Interventions: Assess for all discharge needs, 1 to 1 time with Social worker, Explore available resources and support systems, Assess for adequacy in community support network, Educate family and significant other(s) on suicide prevention, Complete Psychosocial Assessment, Interpersonal group therapy.  Evaluation of Outcomes: Not Met   Progress in Treatment: Attending groups: No. Participating in groups: No. Taking medication as prescribed: Yes. Toleration medication: Yes. Family/Significant other contact made: Yes, individual(s) contacted:  pt's therapist Patient understands diagnosis: No. Discussing patient identified problems/goals with staff: Yes. Medical problems stabilized or resolved: Yes. Denies suicidal/homicidal ideation: No. Issues/concerns per patient self-inventory: No. Other: None  New problem(s) identified: No, Describe:  None  New Short Term/Long Term Goal(s): medication management for mood stabilization; elimination of SI thoughts; development of comprehensive mental wellness plan.   Patient Goals:  "To get better...to get everything straightened out."   Discharge Plan or Barriers: CSW will assist pt with development of appropriate discharge/aftercare plan.   Reason for Continuation of Hospitalization: Anxiety Depression Medication  stabilization Suicidal ideation  Estimated Length of Stay: TBD   Scribe for Treatment Team: Sri Clegg A [place], Latanya Presser 05/09/2021 10:55 AM

## 2021-05-09 NOTE — Progress Notes (Signed)
Patient alert and oriented. Patient rates pain 8/10 stating that the pain is at his spine, neck, and hip. Patient requested more pain medicine. When the writer told the patient that it wasn't time for tylenol the patient stated "tylenol dont work". Patient rated anxiety and depression 10/10. Patient stated that he has "so many problems at home". When asked what type of problems he had patient said " I know I have cancer and Im worried about rent and where Im going to live". Patient endorses SI, when asked if he had a plan patient stated "I dont know how to do it here". Patient verbally contracted for safety. Patient denies HI and AVH. Patient compliant with medication administration. Q15 minute safety checks maintained. Patient remains safe on the unit at this time.

## 2021-05-09 NOTE — Progress Notes (Signed)
NUTRITION ASSESSMENT  Pt identified as at risk on the Malnutrition Screen Tool  INTERVENTION:  -Continue with dysphagia 3 diet for ease of intake -MVI with minerals daily -Ensure Enlive po TID, each supplement provides 350 kcal and 20 grams of protein   NUTRITION DIAGNOSIS: Unintentional weight loss related to sub-optimal intake as evidenced by pt report.   Goal: Pt to meet >/= 90% of their estimated nutrition needs.  Monitor:  PO intake  Assessment:  65 year old male who presented with anxiety and depression; threats of suicidal ideations.   Pt admitted with MDD.   Reviewed I/O's: +240 ml x 24 hours and +720 ml since admission  Per H&P, pt endorses depression and difficulty swallowing. He reports inability to swallow and weight loss; he believes he has esophageal cancer, however, this has never been assessed of diagnosed.   Per chart review, pt is currently tolerating meals and fluids well. Noted meal completion 25-75%. He is currently on a dysphagia 3 diet (mechanical soft), which will aide in ease of intake.   Per MD notes, TSH is low; thyroid labs have been ordered and possible plan for thyroid ultrasound pending results (possible thyroid nodule could explain swallowing issues). Also plan for MBSS.   Reviewed wt hx; pt has experienced a 9.4% wt loss over the past 2 months, which is significant for time frame.   Labs reviewed: Na: 132.    65 y.o. male  Height: Ht Readings from Last 1 Encounters:  05/07/21 5' 10"  (1.778 m)    Weight: Wt Readings from Last 1 Encounters:  05/07/21 65.8 kg    Weight Hx: Wt Readings from Last 10 Encounters:  05/07/21 65.8 kg  04/29/21 67.1 kg  04/24/21 68 kg  04/23/21 69.4 kg  04/22/21 69.4 kg  04/21/21 69.4 kg  04/18/21 69.4 kg  04/04/21 73 kg  03/24/21 72.6 kg  03/07/21 72.6 kg    BMI:  Body mass index is 20.81 kg/m. BMI WDL.   Estimated Nutritional Needs: Kcal: 25-30 kcal/kg Protein: > 1 gram protein/kg Fluid: 1  ml/kcal  Diet Order:  Diet Order             DIET DYS 3 Room service appropriate? Yes; Fluid consistency: Thin  Diet effective now                  Pt is also offered choice of unit snacks mid-morning and mid-afternoon.  Pt is eating as desired.   Lab results and medications reviewed.   Loistine Chance, RD, LDN, [place] Registered Dietitian II Certified Diabetes Care and Education Specialist Please refer to [facility] for RD and/or RD on-call/weekend/after hours pager

## 2021-05-09 NOTE — Progress Notes (Signed)
Patient reports 3 episodes of diarrhea past 24 hours, last one this am.  MD notified.  One time Imodium ordered and given.  Will continue to monitor.

## 2021-05-09 NOTE — Progress Notes (Signed)
[facility] MD Progress Note  05/09/2021 12:20 PM Anthony Lamb  MRN:  [id number] Subjective: Anthony Lamb was seen and examined today.  He has a long history of depression.  He has frequent and the emergency room in [place] numerous times.  He currently lives alone and has a lot of somatic complaints including thinking that he has throat cancer.  He is scheduled for a swallowing study today.  We are awaiting his T4.  He has a payee and his support is Arts development officer from [employer], in [place].  He states that he sees a psychiatrist, Dr. Edsel Petrin at [facility].  He presented to the emergency room after superficially cutting his wrist.  He states that he is depressed.  He is on Seroquel, Ambien, and trazodone at bedtime.  He was started on Prozac and Ativan.  He is very somatically focused and ruminates with negative thoughts.  He has allergies listed to Seroquel for restless legs and states that it does not help him sleep but he insists today that it does.  Principal Problem: MDD (major depressive disorder), recurrent episode, with atypical features ([place]) Diagnosis: Principal Problem:   MDD (major depressive disorder), recurrent episode, with atypical features ([place]) Active Problems:   Essential hypertension   PUD (peptic ulcer disease)   Malnutrition of moderate degree ([place])   Alcohol abuse  Total Time spent with patient: 15 minutes  Past Psychiatric History: 65 year old male with history of MDD who presents to the emergency room with suicidal ideation and attempt today.  Patient was found with a knife to his wrist threatening to cut himself.  Patient does have history of ED visits with suicidal ideations.  He was last evaluated in the emergency room on 11/23.  He denies homicidal ideations, or auditory or visual hallucinations.  During the interview he refers to the fact that he has throat cancer and has been uncomfortable at home.  Patient states he lives alone in an apartment and is worried  that he may lose his benefits soon.  Currently he is without other complaints.  Past Medical History:  Past Medical History:  Diagnosis Date   Alcohol abuse, in remission    Anal fissure    Anemia    Ankylosing spondylitis (HCC)    Anxiety    Arthritis    Chronic diarrhea    Chronic headaches    Chronic pain    Colitis    Colon polyps    Depression    Esophagitis    Gastric polyps    hyperplastic and fundic gland   Gastritis    GERD (gastroesophageal reflux disease)    Hypertension    IBS (irritable bowel syndrome)    Poor dentition    SIADH (syndrome of inappropriate ADH production) ([place])     Past Surgical History:  Procedure Laterality Date   BIOPSY  11/26/2018   Procedure: BIOPSY;  Surgeon: Gatha Mayer, MD;  Location: [facility];  Service: Endoscopy;;   COLONOSCOPY W/ BIOPSIES     COLONOSCOPY WITH PROPOFOL N/A 11/26/2018   Procedure: COLONOSCOPY WITH PROPOFOL;  Surgeon: Gatha Mayer, MD;  Location: [facility];  Service: Endoscopy;  Laterality: N/A;   ESOPHAGOGASTRODUODENOSCOPY     ESOPHAGOGASTRODUODENOSCOPY (EGD) WITH PROPOFOL N/A 11/26/2018   Procedure: ESOPHAGOGASTRODUODENOSCOPY (EGD) WITH PROPOFOL;  Surgeon: Gatha Mayer, MD;  Location: [facility];  Service: Endoscopy;  Laterality: N/A;   HEMOSTASIS CLIP PLACEMENT  11/26/2018   Procedure: HEMOSTASIS CLIP PLACEMENT;  Surgeon: Gatha Mayer, MD;  Location: [facility];  Service: Endoscopy;;   HERNIA REPAIR Bilateral    HOT HEMOSTASIS N/A 11/26/2018   Procedure: HOT HEMOSTASIS (ARGON PLASMA COAGULATION/BICAP);  Surgeon: Gatha Mayer, MD;  Location: [facility];  Service: Endoscopy;  Laterality: N/A;   OPEN REDUCTION INTERNAL FIXATION (ORIF) DISTAL RADIAL FRACTURE Right 02/11/2018   Procedure: OPEN REDUCTION INTERNAL FIXATION (ORIF) DISTAL RADIAL FRACTURE;  Surgeon: Milly Jakob, MD;  Location: [facility];  Service: Orthopedics;  Laterality: Right;   POLYPECTOMY  11/26/2018   Procedure: POLYPECTOMY;   Surgeon: Gatha Mayer, MD;  Location: [facility];  Service: Endoscopy;;   TONSILLECTOMY     Family History:  Family History  Problem Relation Age of Onset   Anxiety disorder Mother    Congestive Heart Failure Mother    Crohn's disease Mother    Colon cancer Mother 65   Arthritis Father    High blood pressure Father    Crohn's disease Father    Family Psychiatric  History: Unremarkable Social History:  Social History   Substance and Sexual Activity  Alcohol Use Not Currently   Comment: 2 25 oz of beer a night     Social History   Substance and Sexual Activity  Drug Use Yes   Types: Marijuana    Social History   Socioeconomic History   Marital status: Single    Spouse name: Not on file   Number of children: 0   Years of education: Not on file   Highest education level: Not on file  Occupational History   Not on file  Tobacco Use   Smoking status: Former    Types: Cigarettes    Quit date: 2014    Years since quitting: 8.9   Smokeless tobacco: Never  Vaping Use   Vaping Use: Never used  Substance and Sexual Activity   Alcohol use: Not Currently    Comment: 2 25 oz of beer a night   Drug use: Yes    Types: Marijuana   Sexual activity: Not Currently  Other Topics Concern   Not on file  Social History Narrative   HSG. Long - term disability - unable to work. Lived with his mother in her house - she died 2023/05/03 -    Lives in apartment   Has case worker   Social Determinants of Radio broadcast assistant Strain: Not on file  Food Insecurity: Not on file  Transportation Needs: Not on file  Physical Activity: Not on file  Stress: Not on file  Social Connections: Not on file   Additional Social History:                         Sleep: Good  Appetite:  Good  Current Medications: Current Facility-Administered Medications  Medication Dose Route Frequency Provider Last Rate Last Admin   acetaminophen (TYLENOL) tablet 650 mg  650 mg Oral  Q6H PRN Parks Ranger, DO   650 mg at 05/09/21 0736   alum & mag hydroxide-simeth (MAALOX/MYLANTA) 200-200-20 MG/5ML suspension 30 mL  30 mL Oral Q4H PRN Parks Ranger, DO   30 mL at 05/09/21 1119   feeding supplement (ENSURE ENLIVE / ENSURE PLUS) liquid 237 mL  237 mL Oral TID BM Clapacs, John T, MD   237 mL at 05/09/21 0951   FLUoxetine (PROZAC) capsule 10 mg  10 mg Oral Daily Clapacs, Madie Reno, MD   10 mg at 05/09/21 0950   lisinopril (ZESTRIL) tablet 10 mg  10 mg Oral Daily Parks Ranger, DO   10 mg at 05/09/21 9528   LORazepam (ATIVAN) tablet 1 mg  1 mg Oral TID Parks Ranger, DO   1 mg at 05/09/21 4132   magnesium hydroxide (MILK OF MAGNESIA) suspension 30 mL  30 mL Oral Daily PRN Parks Ranger, DO       multivitamin with minerals tablet 1 tablet  1 tablet Oral Daily Parks Ranger, DO   1 tablet at 05/09/21 0951   ondansetron (ZOFRAN-ODT) disintegrating tablet 4 mg  4 mg Oral Q4H PRN Parks Ranger, DO       pantoprazole (PROTONIX) EC tablet 40 mg  40 mg Oral Daily Clapacs, Madie Reno, MD   40 mg at 05/09/21 0951   propranolol (INDERAL) tablet 10 mg  10 mg Oral BID Clapacs, John T, MD   10 mg at 05/09/21 0950   QUEtiapine (SEROQUEL) tablet 300 mg  300 mg Oral QHS Clapacs, John T, MD   300 mg at 05/08/21 2101   sucralfate (CARAFATE) 1 GM/10ML suspension 1 g  1 g Oral TID WC & HS Clapacs, John T, MD   1 g at 05/09/21 0730   traZODone (DESYREL) tablet 50 mg  50 mg Oral QHS Parks Ranger, DO   50 mg at 05/08/21 2104   verapamil (CALAN-SR) CR tablet 240 mg  240 mg Oral Daily Parks Ranger, DO   240 mg at 05/09/21 0950   zolpidem (AMBIEN) tablet 10 mg  10 mg Oral QHS Clapacs, Madie Reno, MD   10 mg at 05/08/21 2100    Lab Results:  Results for orders placed or performed during the hospital encounter of 05/07/21 (from the past 48 hour(s))  TSH     Status: Abnormal   Collection Time: 05/07/21  5:09 PM  Result Value Ref  Range   TSH 0.321 (L) 0.350 - 4.500 uIU/mL    Comment: Performed by a 3rd Generation assay with a functional sensitivity of <=0.01 uIU/mL. Performed at [facility], [address] [place], [place] [address]   Lipid panel     Status: None   Collection Time: 05/07/21  5:09 PM  Result Value Ref Range   Cholesterol 183 0 - 200 mg/dL   Triglycerides 79 <150 mg/dL   HDL 69 >40 mg/dL   Total CHOL/HDL Ratio 2.7 RATIO   VLDL 16 0 - 40 mg/dL   LDL Cholesterol 98 0 - 99 mg/dL    Comment:        Total Cholesterol/HDL:CHD Risk Coronary Heart Disease Risk Table                     Men   Women  1/2 Average Risk   3.4   3.3  Average Risk       5.0   4.4  2 X Average Risk   9.6   7.1  3 X Average Risk  23.4   11.0        Use the calculated Patient Ratio above and the CHD Risk Table to determine the patient's CHD Risk.        ATP III CLASSIFICATION (LDL):  <100     mg/dL   Optimal  100-129  mg/dL   Near or Above                    Optimal  130-159  mg/dL   Borderline  160-189  mg/dL  High  >190     mg/dL   Very High Performed at [facility], [address] [place], [place] [address]   Hemoglobin A1c     Status: None   Collection Time: 05/07/21  5:09 PM  Result Value Ref Range   Hgb A1c MFr Bld 5.4 4.8 - 5.6 %    Comment: (NOTE)         Prediabetes: 5.7 - 6.4         Diabetes: >6.4         Glycemic control for adults with diabetes: <7.0    Mean Plasma Glucose 108 mg/dL    Comment: (NOTE) Performed At: [facility] [address] [place], [place] [id number] Rush Farmer MD VF:[id number]     Blood Alcohol level:  Lab Results  Component Value Date   [facility] <10 05/03/2021   ETH <10 41/66/0630    Metabolic Disorder Labs: Lab Results  Component Value Date   HGBA1C 5.4 05/07/2021   MPG 108 05/07/2021   MPG 105 11/12/2016   No results found for: PROLACTIN Lab Results  Component Value Date   CHOL 183 05/07/2021   TRIG 79 05/07/2021   HDL 69  05/07/2021   CHOLHDL 2.7 05/07/2021   VLDL 16 05/07/2021   LDLCALC 98 05/07/2021   LDLCALC 108 (H) 11/12/2016    Physical Findings: AIMS:  , ,  ,  ,    CIWA:    COWS:     Musculoskeletal: Strength & Muscle Tone: within normal limits Gait & Station: normal Patient leans: N/A  Psychiatric Specialty Exam:  Presentation  General Appearance: Casual  Eye Contact:Good  Speech:Clear and Coherent  Speech Volume:Normal  Handedness:Right   Mood and Affect  Mood:Euthymic  Affect:Congruent; Appropriate   Thought Process  Thought Processes:Coherent; Goal Directed  Descriptions of Associations:Intact  Orientation:Full (Time, Place and Person)  Thought Content:Logical  History of Schizophrenia/Schizoaffective disorder:No  Duration of Psychotic Symptoms:Less than six months  Hallucinations:No data recorded Ideas of Reference:None  Suicidal Thoughts:No data recorded Homicidal Thoughts:No data recorded  Sensorium  Memory:Immediate Good; Recent Good; Remote Good  Judgment:Good  Insight:Good   Executive Functions  Concentration:Good  Attention Span:Good  Seba Dalkai  Language:Good   Psychomotor Activity  Psychomotor Activity:No data recorded  Assets  Assets:Communication Skills; Desire for Improvement; Financial Resources/Insurance; Housing; Leisure Time; Resilience   Sleep  Sleep:No data recorded    Physical Exam Constitutional:      Appearance: Normal appearance. He is normal weight.  Neurological:     General: No focal deficit present.     Mental Status: He is alert and oriented to person, place, and time.  Psychiatric:        Attention and Perception: Attention and perception normal.        Mood and Affect: Mood is anxious and depressed. Affect is blunt.        Speech: Speech normal.        Behavior: Behavior normal. Behavior is cooperative.        Thought Content: Thought content includes suicidal ideation.         Cognition and Memory: Cognition and memory normal.        Judgment: Judgment normal.   Review of Systems  Constitutional: Negative.   HENT: Negative.    Eyes: Negative.   Respiratory: Negative.    Cardiovascular: Negative.   Gastrointestinal: Negative.   Genitourinary: Negative.   Musculoskeletal: Negative.   Skin: Negative.   Neurological: Negative.  Endo/Heme/Allergies: Negative.   Psychiatric/Behavioral:  Positive for depression and suicidal ideas.   Blood pressure 129/71, pulse 79, temperature (!) 97.4 F (36.3 C), temperature source Oral, resp. rate 18, height 5' 10"  (1.778 m), weight 65.8 kg, SpO2 97 %. Body mass index is 20.81 kg/m.   Treatment Plan Summary: Daily contact with patient to assess and evaluate symptoms and progress in treatment, Medication management, and Plan continue current medications.  Consider increasing his Prozac and/or adding some low-dose Risperdal.  Parks Ranger, DO 05/09/2021, 12:20 PM

## 2021-05-09 NOTE — Progress Notes (Signed)
Patient complains of indigestion.  Mylanta prn given as ordered.  Will continue to monitor.

## 2021-05-09 NOTE — Progress Notes (Signed)
Patient's right great  toenail tore off when removing sock leaving the corner of nailbed exposed with mild erythema, no bleeding.  MD informed.  Neosporin ordered and applied.  Covered with band aid. Patient denies pain or discomfort.  Will continue to monitor.

## 2021-05-10 NOTE — Progress Notes (Signed)
Recreation Therapy Notes   Date: 05/10/2021  Time: 1:00 pm     Location:  Craft room    Behavioral response: N/A   Intervention Topic: Teamwork   Discussion/Intervention: Patient unable to attend group.  Clinical Observations/Feedback:  Patient unable to attend group.   Adisen Bennion LRT/CTRS        Avion Patella 05/10/2021 2:30 PM

## 2021-05-10 NOTE — Progress Notes (Signed)
   05/10/21 1550  Clinical Encounter Type  Visited With Patient  Visit Type Initial;Spiritual support;Social support  Referral From Other (Comment) (rounding)  [place] introduced herself to Pt. Chaplain provided a compassionate, non-anxious presence as well as active and reflective listening. Chaplain invited Pt to stay focused on the present and not to fixated on anticipated future events. At the same time, Chaplain Burris invited Pt to take small steps toward considering change, including compromises (independent living) in order to havev a better quality of life overall. Pt did share SI. Chaplain Burris established a relationship of care and support. Made plan with Pt to f/u on 12/7.

## 2021-05-10 NOTE — Progress Notes (Signed)
Patient rates his anxiety a 8/10 and depression 9/10. Patient reports sharp pain in his neck, spine and bladder 8/10. Tylenol PRN given as per MAR orders at [facility] by day shift nurse. Pt reports no relief. He stated that he did "not sleep real good" last night. Pt is anxious about his "home problems" having to do with rent and affordability. Pt stated he does not want to go to a nursing home and is apprehensive about his living situation. Pt denies SI, HI and AVH. Pt uses front wheel walker as directed and is steady on his feet. No other complaints were voiced to this Probation officer. Will continue to monitor with q 15 minute safety checks.

## 2021-05-10 NOTE — Progress Notes (Signed)
Recreation Therapy Notes  INPATIENT RECREATION THERAPY ASSESSMENT  Patient Details Name: Anthony Lamb MRN: [id number] DOB: 1956-03-20 Today's Date: 05/10/2021       Information Obtained From: Patient  Able to Participate in Assessment/Interview: Yes  Patient Presentation: Responsive, Withdrawn  Reason for Admission (Per Patient): Active Symptoms  Patient Stressors:    Coping Skills:   Building control surveyor, Avoidance  Leisure Interests (2+):   (Nothing)  Frequency of Recreation/Participation:    Awareness of Community Resources:  No  Community Resources:     Current Use:    If no, Barriers?:    Expressed Interest in [employer] Information: No  County of Residence:  [place]urance under[place]  Patient Main Form of Transportation:    Patient Strengths:  Nothing  Patient Identified Areas of Improvement:  Alot of things I need help with  Patient Goal for Hospitalization:  Getting things settled  Current SI (including self-harm):  No  Current HI:  No  Current AVH: No  Staff Intervention Plan: Group Attendance, Collaborate with Interdisciplinary Treatment Team  Consent to Intern Participation: N/A  Anthony Lamb 05/10/2021, 4:28 PM

## 2021-05-10 NOTE — Progress Notes (Signed)
Patient is alert and oriented x4 very anxious. Patient rate his anxiety a 10/10 and depression 7/10.Patient denies SI, HI, and AVH. Report chronic neck pain, rating pain an 8/10. Medicated with PRN Tylenol with good effect. Patient stated " I have a bladder tumor". Patient continue to inquire about receiving seroquel, trazodone and Ambien tonight, writer answer patient question and inform patient that he will be receiving them tonight. Patient walk away and came back with the same question. Patient if fixated on night med. Ate meals in the day room among staff and peers with good appetite. Remain safe on the unit with q15 safety checks.

## 2021-05-10 NOTE — Progress Notes (Signed)
Recreation Therapy Notes  INPATIENT RECREATION TR PLAN  Patient Details Name: MAGUIRE SIME MRN: [id number] DOB: 1956/04/16 Today's Date: 05/10/2021  Rec Therapy Plan Is patient appropriate for Therapeutic Recreation?: Yes Treatment times per week: at least 3 Estimated Length of Stay: 5-7 days TR Treatment/Interventions: Group participation (Comment)  Discharge Criteria Pt will be discharged from therapy if:: Discharged Treatment plan/goals/alternatives discussed and agreed upon by:: Patient/family  Discharge Summary     Logen Fowle 05/10/2021, 4:28 PM

## 2021-05-10 NOTE — Progress Notes (Signed)
[facility] MD Progress Note  05/10/2021 12:38 PM DATRON BRAKEBILL  MRN:  [id number] Subjective: Anthony Lamb is seen and examined today.  He is very fixated on his nighttime meds which include Seroquel, trazodone, and Ambien.  He states that he slept well last night.  He has a lot of somatic complaints in today's it is his bladder.  He has a history of cutting his wrist and going to the emergency room.  He is currently on Prozac 10 mg/day Ativan 1 mg 3 times a day Seroquel 300 mg at bedtime trazodone 50 mg at bedtime and Ambien 10 mg at bedtime.  He asked about something for his anxiety and I told him he was already on a good dose of Ativan.  He is able to contract for safety while in the hospital.  Principal Problem: MDD (major depressive disorder), recurrent episode, with atypical features ([place]) Diagnosis: Principal Problem:   MDD (major depressive disorder), recurrent episode, with atypical features ([place]) Active Problems:   Essential hypertension   PUD (peptic ulcer disease)   Malnutrition of moderate degree ([place])   Alcohol abuse  Total Time spent with patient: 15 minutes  Past Psychiatric History: He has been to [facility] [employer] [facility] in the past.  Past Medical History:  Past Medical History:  Diagnosis Date   Alcohol abuse, in remission    Anal fissure    Anemia    Ankylosing spondylitis (HCC)    Anxiety    Arthritis    Chronic diarrhea    Chronic headaches    Chronic pain    Colitis    Colon polyps    Depression    Esophagitis    Gastric polyps    hyperplastic and fundic gland   Gastritis    GERD (gastroesophageal reflux disease)    Hypertension    IBS (irritable bowel syndrome)    Poor dentition    SIADH (syndrome of inappropriate ADH production) ([place])     Past Surgical History:  Procedure Laterality Date   BIOPSY  11/26/2018   Procedure: BIOPSY;  Surgeon: Gatha Mayer, MD;  Location: [facility];  Service: Endoscopy;;   COLONOSCOPY W/ BIOPSIES     COLONOSCOPY WITH  PROPOFOL N/A 11/26/2018   Procedure: COLONOSCOPY WITH PROPOFOL;  Surgeon: Gatha Mayer, MD;  [facility];  Service: Endoscopy;  Laterality: N/A;   ESOPHAGOGASTRODUODENOSCOPY     ESOPHAGOGASTRODUODENOSCOPY (EGD) WITH PROPOFOL N/A 11/26/2018   Procedure: ESOPHAGOGASTRODUODENOSCOPY (EGD) WITH PROPOFOL;  Surgeon: Gatha Mayer, MD;  [facility];  Service: Endoscopy;  Laterality: N/A;   HEMOSTASIS CLIP PLACEMENT  11/26/2018   Procedure: HEMOSTASIS CLIP PLACEMENT;  Surgeon: Gatha Mayer, MD;  [facility];  Service: Endoscopy;;   HERNIA REPAIR Bilateral    HOT HEMOSTASIS N/A 11/26/2018   Procedure: HOT HEMOSTASIS (ARGON PLASMA COAGULATION/BICAP);  Surgeon: Gatha Mayer, MD;  [facility];  Service: Endoscopy;  Laterality: N/A;   OPEN REDUCTION INTERNAL FIXATION (ORIF) DISTAL RADIAL FRACTURE Right 02/11/2018   Procedure: OPEN REDUCTION INTERNAL FIXATION (ORIF) DISTAL RADIAL FRACTURE;  Surgeon: Milly Jakob, MD;  Location: [facility];  Service: Orthopedics;  Laterality: Right;   POLYPECTOMY  11/26/2018   Procedure: POLYPECTOMY;  Surgeon: Gatha Mayer, MD;  Location: [facility];  Service: Endoscopy;;   TONSILLECTOMY     Family History:  Family History  Problem Relation Age of Onset   Anxiety disorder Mother    Congestive Heart Failure Mother    Crohn's disease Mother  Colon cancer Mother 33   Arthritis Father    High blood pressure Father    Crohn's disease Father    Family Psychiatric  History: Unremarkable Social History:  Social History   Substance and Sexual Activity  Alcohol Use Not Currently   Comment: 2 25 oz of beer a night     Social History   Substance and Sexual Activity  Drug Use Yes   Types: Marijuana    Social History   Socioeconomic History   Marital status: Single    Spouse name: Not on file   Number of children: 0   Years of education: Not on file   Highest education level: Not on file  Occupational History    Not on file  Tobacco Use   Smoking status: Former    Types: Cigarettes    Quit date: 2014    Years since quitting: 8.9   Smokeless tobacco: Never  Vaping Use   Vaping Use: Never used  Substance and Sexual Activity   Alcohol use: Not Currently    Comment: 2 25 oz of beer a night   Drug use: Yes    Types: Marijuana   Sexual activity: Not Currently  Other Topics Concern   Not on file  Social History Narrative   HSG. Long - term disability - unable to work. Lived with his mother in her house - she died 05-12-2023 -    Lives in apartment   Has case worker   Social Determinants of Radio broadcast assistant Strain: Not on file  Food Insecurity: Not on file  Transportation Needs: Not on file  Physical Activity: Not on file  Stress: Not on file  Social Connections: Not on file   Additional Social History:     Lives independently at home.  Does not smoke cigarettes, marijuana or drink alcoholic beverages.  Does not have a lot of friends, no siblings and his mother and father are deceased.  His caseworker reports irregular compliance with medications but lately taking meds as prescribed.                        Sleep: Good  Appetite:  Fair  Current Medications: Current Facility-Administered Medications  Medication Dose Route Frequency Provider Last Rate Last Admin   acetaminophen (TYLENOL) tablet 650 mg  650 mg Oral Q6H PRN Parks Ranger, DO   650 mg at 05/10/21 0902   alum & mag hydroxide-simeth (MAALOX/MYLANTA) 200-200-20 MG/5ML suspension 30 mL  30 mL Oral Q4H PRN Parks Ranger, DO   30 mL at 05/09/21 1119   feeding supplement (ENSURE ENLIVE / ENSURE PLUS) liquid 237 mL  237 mL Oral TID BM Clapacs, John T, MD   237 mL at 05/10/21 0907   FLUoxetine (PROZAC) capsule 10 mg  10 mg Oral Daily Clapacs, Madie Reno, MD   10 mg at 05/10/21 0903   lisinopril (ZESTRIL) tablet 10 mg  10 mg Oral Daily Parks Ranger, DO   10 mg at 05/10/21 9622   LORazepam  (ATIVAN) tablet 1 mg  1 mg Oral TID Parks Ranger, DO   1 mg at 05/10/21 2979   magnesium hydroxide (MILK OF MAGNESIA) suspension 30 mL  30 mL Oral Daily PRN Parks Ranger, DO       multivitamin with minerals tablet 1 tablet  1 tablet Oral Daily Parks Ranger, DO   1 tablet at 05/10/21 8921   neomycin-bacitracin-polymyxin (NEOSPORIN)  ointment   Topical BID Parks Ranger, DO   Given at 05/10/21 1610   ondansetron (ZOFRAN-ODT) disintegrating tablet 4 mg  4 mg Oral Q4H PRN Parks Ranger, DO       pantoprazole (PROTONIX) EC tablet 40 mg  40 mg Oral Daily Clapacs, Madie Reno, MD   40 mg at 05/10/21 9604   propranolol (INDERAL) tablet 10 mg  10 mg Oral BID Clapacs, Madie Reno, MD   10 mg at 05/10/21 5409   QUEtiapine (SEROQUEL) tablet 300 mg  300 mg Oral QHS Clapacs, John T, MD   300 mg at 05/09/21 2126   sucralfate (CARAFATE) 1 GM/10ML suspension 1 g  1 g Oral TID WC & HS Clapacs, John T, MD   1 g at 05/10/21 1159   traZODone (DESYREL) tablet 50 mg  50 mg Oral QHS Parks Ranger, DO   50 mg at 05/09/21 2125   verapamil (CALAN-SR) CR tablet 240 mg  240 mg Oral Daily Parks Ranger, DO   240 mg at 05/10/21 8119   zolpidem (AMBIEN) tablet 10 mg  10 mg Oral QHS Clapacs, Madie Reno, MD   10 mg at 05/09/21 2125    Lab Results:  Results for orders placed or performed during the hospital encounter of 05/07/21 (from the past 48 hour(s))  T4     Status: None   Collection Time: 05/08/21  4:10 PM  Result Value Ref Range   T4, Total 5.1 4.5 - 12.0 ug/dL    Comment: (NOTE) Performed At: [facility] [place], [place] [id number] Rush Farmer MD [id number]   T3 uptake     Status: None   Collection Time: 05/08/21  4:10 PM  Result Value Ref Range   T3 Uptake Ratio 28 24 - 39 %    Blood Alcohol level:  Lab Results  Component Value Date   ETH <10 05/03/2021   ETH <10 29/52/8413    Metabolic Disorder Labs: Lab Results   Component Value Date   HGBA1C 5.4 05/07/2021   MPG 108 05/07/2021   MPG 105 11/12/2016   No results found for: PROLACTIN Lab Results  Component Value Date   CHOL 183 05/07/2021   TRIG 79 05/07/2021   HDL 69 05/07/2021   CHOLHDL 2.7 05/07/2021   VLDL 16 05/07/2021   LDLCALC 98 05/07/2021   LDLCALC 108 (H) 11/12/2016    Physical Findings: AIMS:  , ,  ,  ,    CIWA:    COWS:     Musculoskeletal: Strength & Muscle Tone: within normal limits Gait & Station: unsteady Patient leans: N/A  Psychiatric Specialty Exam:  Presentation  General Appearance: Casual  Eye Contact:Good  Speech:Clear and Coherent  Speech Volume:Normal  Handedness:Right   Mood and Affect  Mood:Euthymic  Affect:Congruent; Appropriate   Thought Process  Thought Processes:Coherent; Goal Directed  Descriptions of Associations:Intact  Orientation:Full (Time, Place and Person)  Thought Content:Logical  History of Schizophrenia/Schizoaffective disorder:No  Duration of Psychotic Symptoms:Less than six months  Hallucinations:No data recorded Ideas of Reference:None  Suicidal Thoughts:No data recorded Homicidal Thoughts:No data recorded  Sensorium  Memory:Immediate Good; Recent Good; Remote Good  Judgment:Good  Insight:Good   Executive Functions  Concentration:Good  Attention Span:Good  [place]  Language:Good   Psychomotor Activity  Psychomotor Activity:No data recorded  Assets  Assets:Communication Skills; Desire for Improvement; Financial Resources/Insurance; Housing; Leisure Time; Resilience   Sleep  Sleep:No data recorded   Physical Exam: Physical Exam  Vitals and nursing note reviewed.  Constitutional:      Appearance: Normal appearance. He is normal weight.  Neurological:     General: No focal deficit present.     Mental Status: He is alert and oriented to person, place, and time.  Psychiatric:        Attention and  Perception: Attention and perception normal.        Mood and Affect: Mood is anxious and depressed. Affect is flat.        Speech: Speech normal.        Behavior: Behavior normal. Behavior is cooperative.        Thought Content: Thought content normal.        Cognition and Memory: Cognition and memory normal.        Judgment: Judgment is impulsive.   Review of Systems  Constitutional: Negative.   HENT: Negative.    Eyes: Negative.   Respiratory: Negative.    Cardiovascular: Negative.   Gastrointestinal: Negative.   Genitourinary: Negative.   Musculoskeletal: Negative.   Skin: Negative.   Neurological: Negative.   Endo/Heme/Allergies: Negative.   Psychiatric/Behavioral:  Positive for depression and suicidal ideas.   Blood pressure 111/64, pulse 99, temperature 97.8 F (36.6 C), temperature source Oral, resp. rate 20, height 5' 10"  (1.778 m), weight 65.8 kg, SpO2 100 %. Body mass index is 20.81 kg/m.   Treatment Plan Summary: Daily contact with patient to assess and evaluate symptoms and progress in treatment, Medication management, and Plan continue current medications.  Parks Ranger, DO 05/10/2021, 12:38 PM

## 2021-05-10 NOTE — Progress Notes (Signed)
Patient currently denies SI/HI/AVH. Patient rates pain 10/10. 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 Patient frequently referring to tumor becoming worried. Patient stated "I know I have a tumor because there was blood in my pee". When writer asked patient if he was currently seeing blood in urine since admission patient stated "no not while I've been here, I mean when I saw the urologist". Patient rates anxiety and depression 10/10 stating "Ive had it for years, I  have so many problems at home I dont know what to do".  Patient compliant with medication administration. Q15 minute safety checks maintained. Patient remains safe on the unit at this time.

## 2021-05-11 MED ORDER — FLUOXETINE HCL 20 MG PO CAPS
20.0000 mg | ORAL_CAPSULE | Freq: Every day | ORAL | Status: DC
  Administered 2021-05-12 – 2021-05-20 (×9): 20 mg via ORAL
  Filled 2021-05-11 (×9): qty 1

## 2021-05-11 MED ORDER — HYDROXYZINE HCL 25 MG PO TABS
25.0000 mg | ORAL_TABLET | Freq: Three times a day (TID) | ORAL | Status: DC | PRN
  Administered 2021-05-11 – 2021-05-20 (×12): 25 mg via ORAL
  Filled 2021-05-11 (×12): qty 1

## 2021-05-11 NOTE — Plan of Care (Signed)
Patient presents Flat and Isolative at times. PT did endorse SI denies HI/Avh. Patient contracts for safety at hospital "I don't have anything here to do it with"  Patient verbalized 8/10 Anxiety/Depression.  Patient did c/o 8/10 Neck pain. PRN med given to manage pain.  Patient did verbalize he slept well with scheduled medications prescribed.  Patient did have participate in therapeutic milieu.  Informed pt to notify nursing staff if any further needs arise. Q 15 min safety rounding in place.  Plan of care will continue.   Problem: Education: Goal: Knowledge of [facility] Education information/materials will improve Outcome: Progressing Goal: Verbalization of understanding the information provided will improve Outcome: Progressing   Problem: Activity: Goal: Interest or engagement in activities will improve Outcome: Progressing   Problem: Education: Goal: Emotional status will improve Outcome: Not Progressing Goal: Mental status will improve Outcome: Not Progressing

## 2021-05-11 NOTE — Progress Notes (Signed)
[facility] MD Progress Note  05/11/2021 12:02 PM Anthony Lamb  MRN:  [id number] Subjective: [place] is seen and examined today.  He continues to complain of depression and anxiety.  He is very fixated on his medications at night.  He continues to tell me that he is going to kill himself because he has so many problems.  When I asked him what problems he had they seemed pretty minor.  1 was that he had not paid his rent but he has a payee that is taking care of it.  Another is that he does not have any food in his apartment.  He does have support staff at [employer] in [place].Sharyn Lull ).  On admission Sharyn Lull passed on that this is very much like him.  He has had numerous admissions and visits to the emergency room.  He asks about taking something else for anxiety even though he was started on Ativan 1 mg 3 times a day.  I talked to him about Vistaril and he says that his restless legs I support about in the daytime and he said that he would try it.  Principal Problem: MDD (major depressive disorder), recurrent episode, with atypical features ([place]) Diagnosis: Principal Problem:   MDD (major depressive disorder), recurrent episode, with atypical features ([place]) Active Problems:   Essential hypertension   PUD (peptic ulcer disease)   Malnutrition of moderate degree ([place])   Alcohol abuse  Total Time spent with patient: 15 minutes  Past Psychiatric History: Extensive  Past Medical History:  Past Medical History:  Diagnosis Date   Alcohol abuse, in remission    Anal fissure    Anemia    Ankylosing spondylitis (HCC)    Anxiety    Arthritis    Chronic diarrhea    Chronic headaches    Chronic pain    Colitis    Colon polyps    Depression    Esophagitis    Gastric polyps    hyperplastic and fundic gland   Gastritis    GERD (gastroesophageal reflux disease)    Hypertension    IBS (irritable bowel syndrome)    Poor dentition    SIADH (syndrome of inappropriate ADH production)  ([place])     Past Surgical History:  Procedure Laterality Date   BIOPSY  11/26/2018   Procedure: BIOPSY;  Surgeon: Gatha Mayer, MD;  [facility]: [facility];  Service: Endoscopy;;   COLONOSCOPY W/ BIOPSIES     COLONOSCOPY WITH PROPOFOL N/A 11/26/2018   Procedure: COLONOSCOPY WITH PROPOFOL;  Surgeon: Gatha Mayer, MD;  [facility];  Service: Endoscopy;  Laterality: N/A;   ESOPHAGOGASTRODUODENOSCOPY     ESOPHAGOGASTRODUODENOSCOPY (EGD) WITH PROPOFOL N/A 11/26/2018   Procedure: ESOPHAGOGASTRODUODENOSCOPY (EGD) WITH PROPOFOL;  Surgeon: Gatha Mayer, MD;  [facility];  Service: Endoscopy;  Laterality: N/A;   HEMOSTASIS CLIP PLACEMENT  11/26/2018   Procedure: HEMOSTASIS CLIP PLACEMENT;  Surgeon: Gatha Mayer, MD;  [facility];  Service: Endoscopy;;   HERNIA REPAIR Bilateral    HOT HEMOSTASIS N/A 11/26/2018   Procedure: HOT HEMOSTASIS (ARGON PLASMA COAGULATION/BICAP);  Surgeon: Gatha Mayer, MD;  [facility];  Service: Endoscopy;  Laterality: N/A;   OPEN REDUCTION INTERNAL FIXATION (ORIF) DISTAL RADIAL FRACTURE Right 02/11/2018   Procedure: OPEN REDUCTION INTERNAL FIXATION (ORIF) DISTAL RADIAL FRACTURE;  Surgeon: Milly Jakob, MD;  Location: [facility];  Service: Orthopedics;  Laterality: Right;   POLYPECTOMY  11/26/2018   Procedure: POLYPECTOMY;  Surgeon: Gatha Mayer, MD;  [facility];  Service: Endoscopy;;   TONSILLECTOMY     Family History:  Family History  Problem Relation Age of Onset   Anxiety disorder Mother    Congestive Heart Failure Mother    Crohn's disease Mother    Colon cancer Mother 93   Arthritis Father    High blood pressure Father    Crohn's disease Father    Family Psychiatric  History: Unremarkable Social History:  Social History   Substance and Sexual Activity  Alcohol Use Not Currently   Comment: 2 25 oz of beer a night     Social History   Substance and Sexual Activity  Drug Use Yes   Types:  Marijuana    Social History   Socioeconomic History   Marital status: Single    Spouse name: Not on file   Number of children: 0   Years of education: Not on file   Highest education level: Not on file  Occupational History   Not on file  Tobacco Use   Smoking status: Former    Types: Cigarettes    Quit date: 2014    Years since quitting: 8.9   Smokeless tobacco: Never  Vaping Use   Vaping Use: Never used  Substance and Sexual Activity   Alcohol use: Not Currently    Comment: 2 25 oz of beer a night   Drug use: Yes    Types: Marijuana   Sexual activity: Not Currently  Other Topics Concern   Not on file  Social History Narrative   HSG. Long - term disability - unable to work. Lived with his mother in her house - she died 27-Apr-2023 -    Lives in apartment   Has case worker   Social Determinants of Radio broadcast assistant Strain: Not on file  Food Insecurity: Not on file  Transportation Needs: Not on file  Physical Activity: Not on file  Stress: Not on file  Social Connections: Not on file   Additional Social History:                         Sleep: Good  Appetite:  Fair  Current Medications: Current Facility-Administered Medications  Medication Dose Route Frequency Provider Last Rate Last Admin   acetaminophen (TYLENOL) tablet 650 mg  650 mg Oral Q6H PRN Parks Ranger, DO   650 mg at 05/11/21 0812   alum & mag hydroxide-simeth (MAALOX/MYLANTA) 200-200-20 MG/5ML suspension 30 mL  30 mL Oral Q4H PRN Parks Ranger, DO   30 mL at 05/09/21 1119   feeding supplement (ENSURE ENLIVE / ENSURE PLUS) liquid 237 mL  237 mL Oral TID BM Clapacs, John T, MD   237 mL at 05/11/21 0942   [START ON 05/12/2021] FLUoxetine (PROZAC) capsule 20 mg  20 mg Oral Daily Parks Ranger, DO       hydrOXYzine (ATARAX) tablet 25 mg  25 mg Oral TID PRN Parks Ranger, DO       lisinopril (ZESTRIL) tablet 10 mg  10 mg Oral Daily Parks Ranger, DO   10 mg at 05/11/21 6160   LORazepam (ATIVAN) tablet 1 mg  1 mg Oral TID Parks Ranger, DO   1 mg at 05/11/21 7371   magnesium hydroxide (MILK OF MAGNESIA) suspension 30 mL  30 mL Oral Daily PRN Parks Ranger, DO       multivitamin with minerals tablet 1 tablet  1  tablet Oral Daily Parks Ranger, DO   1 tablet at 05/11/21 0998   neomycin-bacitracin-polymyxin (NEOSPORIN) ointment   Topical BID Parks Ranger, DO   1 application at 33/82/50 5397   ondansetron (ZOFRAN-ODT) disintegrating tablet 4 mg  4 mg Oral Q4H PRN Parks Ranger, DO       pantoprazole (PROTONIX) EC tablet 40 mg  40 mg Oral Daily Clapacs, Madie Reno, MD   40 mg at 05/11/21 6734   propranolol (INDERAL) tablet 10 mg  10 mg Oral BID Clapacs, Madie Reno, MD   10 mg at 05/11/21 1937   QUEtiapine (SEROQUEL) tablet 300 mg  300 mg Oral QHS Clapacs, John T, MD   300 mg at 05/10/21 2104   sucralfate (CARAFATE) 1 GM/10ML suspension 1 g  1 g Oral TID WC & HS Clapacs, Madie Reno, MD   1 g at 05/11/21 0749   traZODone (DESYREL) tablet 50 mg  50 mg Oral QHS Parks Ranger, DO   50 mg at 05/10/21 2104   verapamil (CALAN-SR) CR tablet 240 mg  240 mg Oral Daily Parks Ranger, DO   240 mg at 05/11/21 9024   zolpidem (AMBIEN) tablet 10 mg  10 mg Oral QHS Clapacs, Madie Reno, MD   10 mg at 05/10/21 2104    Lab Results: No results found for this or any previous visit (from the past 48 hour(s)).  Blood Alcohol level:  Lab Results  Component Value Date   ETH <10 05/03/2021   ETH <10 09/73/5329    Metabolic Disorder Labs: Lab Results  Component Value Date   HGBA1C 5.4 05/07/2021   MPG 108 05/07/2021   MPG 105 11/12/2016   No results found for: PROLACTIN Lab Results  Component Value Date   CHOL 183 05/07/2021   TRIG 79 05/07/2021   HDL 69 05/07/2021   CHOLHDL 2.7 05/07/2021   VLDL 16 05/07/2021   LDLCALC 98 05/07/2021   LDLCALC 108 (H) 11/12/2016    Physical  Findings: AIMS:  , ,  ,  ,    CIWA:    COWS:     Musculoskeletal: Strength & Muscle Tone: within normal limits Gait & Station: normal Patient leans: N/A  Psychiatric Specialty Exam:  Presentation  General Appearance: Casual  Eye Contact:Good  Speech:Clear and Coherent  Speech Volume:Normal  Handedness:Right   Mood and Affect  Mood:Euthymic  Affect:Congruent; Appropriate   Thought Process  Thought Processes:Coherent; Goal Directed  Descriptions of Associations:Intact  Orientation:Full (Time, Place and Person)  Thought Content:Logical  History of Schizophrenia/Schizoaffective disorder:No  Duration of Psychotic Symptoms:Less than six months  Hallucinations:No data recorded Ideas of Reference:None  Suicidal Thoughts:No data recorded Homicidal Thoughts:No data recorded  Sensorium  Memory:Immediate Good; Recent Good; Remote Good  Judgment:Good  Insight:Good   Executive Functions  Concentration:Good  Attention Span:Good  [place]  Language:Good   Psychomotor Activity  Psychomotor Activity:No data recorded  Assets  Assets:Communication Skills; Desire for Improvement; Financial Resources/Insurance; Housing; Leisure Time; Resilience   Sleep  Sleep:No data recorded   Physical Exam: Physical Exam Vitals and nursing note reviewed.  Constitutional:      Appearance: Normal appearance. He is normal weight.  Neurological:     General: No focal deficit present.     Mental Status: He is alert and oriented to person, place, and time.  Psychiatric:        Attention and Perception: Attention and perception normal.        Mood  and Affect: Mood is anxious and depressed. Affect is blunt.        Speech: Speech normal.        Behavior: Behavior normal. Behavior is cooperative.        Thought Content: Thought content is paranoid. Thought content includes suicidal ideation.        Cognition and Memory: Cognition and memory  normal.        Judgment: Judgment is impulsive.   Review of Systems  Constitutional: Negative.   HENT: Negative.    Eyes: Negative.   Respiratory: Negative.    Cardiovascular: Negative.   Gastrointestinal: Negative.   Genitourinary: Negative.   Musculoskeletal: Negative.   Skin: Negative.   Neurological: Negative.   Endo/Heme/Allergies: Negative.   Psychiatric/Behavioral:  Positive for depression and suicidal ideas.   Blood pressure 119/73, pulse 85, temperature 98.2 F (36.8 C), temperature source Oral, resp. rate 20, height 5' 10"  (1.778 m), weight 65.8 kg, SpO2 99 %. Body mass index is 20.81 kg/m.   Treatment Plan Summary: Daily contact with patient to assess and evaluate symptoms and progress in treatment, Medication management, and Plan Start Vistaril 25 mg TID prn.  Continue current meds.  Parks Ranger, DO 05/11/2021, 12:02 PM

## 2021-05-11 NOTE — Progress Notes (Signed)
Pt continues to frequent the nurses station requesting night medications "I have bad bad problems".  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  Nursing staff continue to actively listen, offer emotional support and redirect patient.  Writer did administer prn meds to available to manage symptoms.

## 2021-05-11 NOTE — Group Note (Signed)
[facility] LCSW Group Therapy Note   Group Date: 05/11/2021 Start Time: 1683 End Time: 1100   Type of Therapy/Topic:  Group Therapy:  Emotion Regulation  Participation Level:  Did Not Attend    Description of Group:    The purpose of this group is to assist patients in learning to regulate negative emotions and experience positive emotions. Patients will be guided to discuss ways in which they have been vulnerable to their negative emotions. These vulnerabilities will be juxtaposed with experiences of positive emotions or situations, and patients challenged to use positive emotions to combat negative ones. Special emphasis will be placed on coping with negative emotions in conflict situations, and patients will process healthy conflict resolution skills.  Therapeutic Goals: Patient will identify two positive emotions or experiences to reflect on in order to balance out negative emotions:  Patient will label two or more emotions that they find the most difficult to experience:  Patient will be able to demonstrate positive conflict resolution skills through discussion or role plays:   Summary of Patient Progress:   X    Therapeutic Modalities:   Cognitive Behavioral Therapy Feelings Identification Dialectical Behavioral Therapy   Kina Shiffman A [place], LCSWA

## 2021-05-11 NOTE — Progress Notes (Signed)
Recreation Therapy Notes   Date: 05/11/2021  Time: 1:00 pm     Location:  [facility]    Behavioral response: N/A   Intervention Topic: Honesty  Discussion/Intervention: Patient did not attend group.  Clinical Observations/Feedback:  Patient did not attend group.   Yatzari Jonsson LRT/CTRS        Barbara Ahart 05/11/2021 3:55 PM

## 2021-05-12 NOTE — Plan of Care (Signed)
Patient Anxious and Hyper Focused on current living arrangements, somatic complaints and medications.  Patient endorsed SI without a current plan. Patient verbalized Anxiety/Depression as 10/10. Staff continue to redirect and offer emotional support. Writer did encourage patient to participate in therapeutic milieu and attend group therapy in efforts to develop healthy coping skills. Patient educated on importance of compliance with treatment plan. Patient did comply with medications as prescribed by Provider. PRNs given to manage symptoms.  Q 15 minute safety rounding in place.  Patient in no apparent distress.  Plan of care will continue.   Problem: Education: Goal: Emotional status will improve Outcome: Not Progressing Goal: Mental status will improve Outcome: Not Progressing Goal: Verbalization of understanding the information provided will improve Outcome: Not Progressing

## 2021-05-12 NOTE — Progress Notes (Signed)
   05/12/21 0945  Clinical Encounter Type  Visited With Patient  Visit Type Follow-up;Spiritual support;Social support  Referral From Other (Comment) (rounding)  [facility] f/u with Pt who continues to express anxious feelings about discharge, state of affairs. Pt did express both desire and intent to self-harm. Pt stated "if I go back home I have sharp scissors that I will use to cut my wrist." Chaplain engaged care team to help offer Pt affirmation that there are no plans for discharge at this time and encouraged Pt to remain grounded and focused on the day ahead and taking one day at a time. Pt expressed that he felt he was not being taken seriously about SI but also admitted that he had denied SI when asked last night.  Pt expressed desire for chaplain to continue to visit and that pastoral presence is comforting. Also continuing to invite Pt to become open-minded to transition to SNF. Will continue to follow.

## 2021-05-12 NOTE — Progress Notes (Signed)
Recreation Therapy Notes  Date: 05/12/2021   Time: 1:00 pm     Location:  Day room      Behavioral response: N/A   Intervention Topic: Strengths    Discussion/Intervention: Patient did not attend group.   Clinical Observations/Feedback:  Patient did not attend group.   Olon Russ LRT/CTRS        Mattilynn Forrer 05/12/2021 1:16 PM

## 2021-05-12 NOTE — Progress Notes (Signed)
   05/12/21 1605  Clinical Encounter Type  Visited With Patient  Visit Type Follow-up;Spiritual support;Social support  Spiritual Encounters  Spiritual Needs Emotional  Brief check-in with Pt. Outlook seems somewhat better but has complaint about possible fever. Encouraged Pt to eat, to request soup if available. Chaplain offered general support, encouragement, and non-anxious presence. Will continue to follow.

## 2021-05-12 NOTE — Progress Notes (Signed)
Patient currently denies SI/HI/AVH. Patient rates pain 8/10 stating "I have pain in different places". Patient rates anxiety and depression 10/10. Patient stated that he "dont have a place to stay when I leave" also stated he didn't  have any family and that he was worried about how he was going to pay rent while he was here. 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  Patient compliant with medication administration.  Q15 minute safety checks maintained. Patient remains safe on the unit at this time.

## 2021-05-12 NOTE — Progress Notes (Signed)
[facility] MD Progress Note  05/12/2021 11:57 AM Anthony Lamb  MRN:  [id number] Subjective: Anthony Lamb is seen and examined today.  He continues to be very somatically focused.  He is taking his medications as prescribed and denies any side effects.  He perseverates on his nighttime medications.  He is sleeping well and eating.  He is chronically suicidal and the most he has done in the past to superficially cut himself and go to the emergency room numerous times.  PER NURSES NOTES: Pt continues to frequent the nurses station requesting night medications "I have bad bad problems".  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  Nursing staff continue to actively listen, offer emotional support and redirect patient.  Writer did administer prn meds to available to manage symptoms.     Principal Problem: MDD (major depressive disorder), recurrent episode, with atypical features ([place]) Diagnosis: Principal Problem:   MDD (major depressive disorder), recurrent episode, with atypical features ([place]) Active Problems:   Essential hypertension   PUD (peptic ulcer disease)   Malnutrition of moderate degree ([place])   Alcohol abuse  Total Time spent with patient: 15 minutes  Past Psychiatric History: Extensive  Past Medical History:  Past Medical History:  Diagnosis Date   Alcohol abuse, in remission    Anal fissure    Anemia    Ankylosing spondylitis (HCC)    Anxiety    Arthritis    Chronic diarrhea    Chronic headaches    Chronic pain    Colitis    Colon polyps    Depression    Esophagitis    Gastric polyps    hyperplastic and fundic gland   Gastritis    GERD (gastroesophageal reflux disease)    Hypertension    IBS (irritable bowel syndrome)    Poor dentition    SIADH (syndrome of inappropriate ADH production) ([place])     Past Surgical History:  Procedure Laterality Date   BIOPSY  11/26/2018   Procedure: BIOPSY;  Surgeon: Gatha Mayer, MD;   Location: [facility];  Service: Endoscopy;;   COLONOSCOPY W/ BIOPSIES     COLONOSCOPY WITH PROPOFOL N/A 11/26/2018   Procedure: COLONOSCOPY WITH PROPOFOL;  Surgeon: Gatha Mayer, MD;  Location: [facility];  Service: Endoscopy;  Laterality: N/A;   ESOPHAGOGASTRODUODENOSCOPY     ESOPHAGOGASTRODUODENOSCOPY (EGD) WITH PROPOFOL N/A 11/26/2018   Procedure: ESOPHAGOGASTRODUODENOSCOPY (EGD) WITH PROPOFOL;  Surgeon: Gatha Mayer, MD;  Location: [facility];  Service: Endoscopy;  Laterality: N/A;   HEMOSTASIS CLIP PLACEMENT  11/26/2018   Procedure: HEMOSTASIS CLIP PLACEMENT;  Surgeon: Gatha Mayer, MD;  Location: [facility];  Service: Endoscopy;;   HERNIA REPAIR Bilateral    HOT HEMOSTASIS N/A 11/26/2018   Procedure: HOT HEMOSTASIS (ARGON PLASMA COAGULATION/BICAP);  Surgeon: Gatha Mayer, MD;  Location: [facility];  Service: Endoscopy;  Laterality: N/A;   OPEN REDUCTION INTERNAL FIXATION (ORIF) DISTAL RADIAL FRACTURE Right 02/11/2018   Procedure: OPEN REDUCTION INTERNAL FIXATION (ORIF) DISTAL RADIAL FRACTURE;  Surgeon: Milly Jakob, MD;  Location: [facility];  Service: Orthopedics;  Laterality: Right;   POLYPECTOMY  11/26/2018   Procedure: POLYPECTOMY;  Surgeon: Gatha Mayer, MD;  Location: [facility];  Service: Endoscopy;;   TONSILLECTOMY     Family History:  Family History  Problem Relation Age of Onset   Anxiety disorder Mother    Congestive Heart Failure Mother    Crohn's disease Mother    Colon  cancer Mother 28   Arthritis Father    High blood pressure Father    Crohn's disease Father    Family Psychiatric  History: Unremarkable Social History:  Social History   Substance and Sexual Activity  Alcohol Use Not Currently   Comment: 2 25 oz of beer a night     Social History   Substance and Sexual Activity  Drug Use Yes   Types: Marijuana    Social History   Socioeconomic History   Marital status: Single    Spouse name: Not on file   Number of children: 0    Years of education: Not on file   Highest education level: Not on file  Occupational History   Not on file  Tobacco Use   Smoking status: Former    Types: Cigarettes    Quit date: 2014    Years since quitting: 8.9   Smokeless tobacco: Never  Vaping Use   Vaping Use: Never used  Substance and Sexual Activity   Alcohol use: Not Currently    Comment: 2 25 oz of beer a night   Drug use: Yes    Types: Marijuana   Sexual activity: Not Currently  Other Topics Concern   Not on file  Social History Narrative   HSG. Long - term disability - unable to work. Lived with his mother in her house - she died 05-11-2023 -    Lives in apartment   Has case worker   Social Determinants of Radio broadcast assistant Strain: Not on file  Food Insecurity: Not on file  Transportation Needs: Not on file  Physical Activity: Not on file  Stress: Not on file  Social Connections: Not on file   Additional Social History:    He went to [employer] in [place].                    Sleep: Good  Appetite:  Good  Current Medications: Current Facility-Administered Medications  Medication Dose Route Frequency Provider Last Rate Last Admin   acetaminophen (TYLENOL) tablet 650 mg  650 mg Oral Q6H PRN Parks Ranger, DO   650 mg at 05/12/21 1054   alum & mag hydroxide-simeth (MAALOX/MYLANTA) 200-200-20 MG/5ML suspension 30 mL  30 mL Oral Q4H PRN Parks Ranger, DO   30 mL at 05/12/21 1054   feeding supplement (ENSURE ENLIVE / ENSURE PLUS) liquid 237 mL  237 mL Oral TID BM Clapacs, John T, MD   237 mL at 05/12/21 0926   FLUoxetine (PROZAC) capsule 20 mg  20 mg Oral Daily Parks Ranger, DO   20 mg at 05/12/21 1157   hydrOXYzine (ATARAX) tablet 25 mg  25 mg Oral TID PRN Parks Ranger, DO   25 mg at 05/12/21 0743   lisinopril (ZESTRIL) tablet 10 mg  10 mg Oral Daily Parks Ranger, DO   10 mg at 05/12/21 2620   LORazepam (ATIVAN) tablet 1 mg  1 mg  Oral TID Parks Ranger, DO   1 mg at 05/12/21 3559   magnesium hydroxide (MILK OF MAGNESIA) suspension 30 mL  30 mL Oral Daily PRN Parks Ranger, DO       multivitamin with minerals tablet 1 tablet  1 tablet Oral Daily Parks Ranger, DO   1 tablet at 05/12/21 7416   neomycin-bacitracin-polymyxin (NEOSPORIN) ointment   Topical BID Parks Ranger, DO   Given at 05/12/21 0923   ondansetron (ZOFRAN-ODT)  disintegrating tablet 4 mg  4 mg Oral Q4H PRN Parks Ranger, DO       pantoprazole (PROTONIX) EC tablet 40 mg  40 mg Oral Daily Clapacs, Madie Reno, MD   40 mg at 05/12/21 6553   propranolol (INDERAL) tablet 10 mg  10 mg Oral BID Clapacs, Madie Reno, MD   10 mg at 05/12/21 7482   QUEtiapine (SEROQUEL) tablet 300 mg  300 mg Oral QHS Clapacs, John T, MD   300 mg at 05/11/21 2126   sucralfate (CARAFATE) 1 GM/10ML suspension 1 g  1 g Oral TID WC & HS Clapacs, John T, MD   1 g at 05/12/21 1150   traZODone (DESYREL) tablet 50 mg  50 mg Oral QHS Parks Ranger, DO   50 mg at 05/11/21 2126   verapamil (CALAN-SR) CR tablet 240 mg  240 mg Oral Daily Parks Ranger, DO   240 mg at 05/12/21 7078   zolpidem (AMBIEN) tablet 10 mg  10 mg Oral QHS Clapacs, Madie Reno, MD   10 mg at 05/11/21 2126    Lab Results: No results found for this or any previous visit (from the past 48 hour(s)).  Blood Alcohol level:  Lab Results  Component Value Date   ETH <10 05/03/2021   ETH <10 67/54/4920    Metabolic Disorder Labs: Lab Results  Component Value Date   HGBA1C 5.4 05/07/2021   MPG 108 05/07/2021   MPG 105 11/12/2016   No results found for: PROLACTIN Lab Results  Component Value Date   CHOL 183 05/07/2021   TRIG 79 05/07/2021   HDL 69 05/07/2021   CHOLHDL 2.7 05/07/2021   VLDL 16 05/07/2021   LDLCALC 98 05/07/2021   LDLCALC 108 (H) 11/12/2016    Physical Findings: AIMS:  , ,  ,  ,    CIWA:    COWS:     Musculoskeletal: Strength & Muscle Tone:  within normal limits Gait & Station: normal Patient leans: N/A  Psychiatric Specialty Exam:  Presentation  General Appearance: Casual  Eye Contact:Good  Speech:Clear and Coherent  Speech Volume:Normal  Handedness:Right   Mood and Affect  Mood:Euthymic  Affect:Congruent; Appropriate   Thought Process  Thought Processes:Coherent; Goal Directed  Descriptions of Associations:Intact  Orientation:Full (Time, Place and Person)  Thought Content:Logical  History of Schizophrenia/Schizoaffective disorder:No  Duration of Psychotic Symptoms:Less than six months  Hallucinations:No data recorded Ideas of Reference:None  Suicidal Thoughts:No data recorded Homicidal Thoughts:No data recorded  Sensorium  Memory:Immediate Good; Recent Good; Remote Good  Judgment:Good  Insight:Good   Executive Functions  Concentration:Good  Attention Span:Good  [place]  Language:Good   Psychomotor Activity  Psychomotor Activity:No data recorded  Assets  Assets:Communication Skills; Desire for Improvement; Financial Resources/Insurance; Housing; Leisure Time; Resilience   Sleep  Sleep:No data recorded    Physical Exam Vitals and nursing note reviewed.  Constitutional:      Appearance: Normal appearance. He is normal weight.  Neurological:     General: No focal deficit present.     Mental Status: He is alert and oriented to person, place, and time.  Psychiatric:        Attention and Perception: Attention and perception normal.        Mood and Affect: Mood is anxious and depressed. Affect is flat.        Speech: Speech normal.        Behavior: Behavior normal. Behavior is cooperative.  Thought Content: Thought content is paranoid and delusional. Thought content includes suicidal ideation.        Cognition and Memory: Cognition and memory normal.        Judgment: Judgment is impulsive.   Review of Systems  Constitutional: Negative.    HENT: Negative.    Eyes: Negative.   Respiratory: Negative.    Cardiovascular: Negative.   Gastrointestinal: Negative.   Genitourinary: Negative.   Musculoskeletal: Negative.   Skin: Negative.   Neurological: Negative.   Endo/Heme/Allergies: Negative.   Psychiatric/Behavioral:  Positive for depression and suicidal ideas.   Blood pressure 114/74, pulse 76, temperature 98 F (36.7 C), temperature source Oral, resp. rate 18, height 5' 10"  (1.778 m), weight 65.8 kg, SpO2 97 %. Body mass index is 20.81 kg/m.   Treatment Plan Summary: Daily contact with patient to assess and evaluate symptoms and progress in treatment, Medication management, and Plan continue current medications.  Parks Ranger, DO 05/12/2021, 11:57 AM

## 2021-05-13 MED ORDER — RISPERIDONE 1 MG PO TABS
0.5000 mg | ORAL_TABLET | Freq: Two times a day (BID) | ORAL | Status: DC
  Administered 2021-05-13 – 2021-05-20 (×15): 0.5 mg via ORAL
  Filled 2021-05-13 (×15): qty 1

## 2021-05-13 NOTE — Progress Notes (Signed)
Recreation Therapy Notes    Date: 05/13/2021   Time: 1:00 pm     Location:  Day room      Behavioral response: N/A   Intervention Topic: Communication     Discussion/Intervention: Patient did not attend group.   Clinical Observations/Feedback:  Patient did not attend group.   Jerusha Reising LRT/CTRS        Holle Sprick 05/13/2021 1:58 PM

## 2021-05-13 NOTE — Plan of Care (Addendum)
Patient presents A&Ox4.  Affect is anxious stating, "I'm just so worried I have so many problems."  Main stressors are fear of losing his apartment.  Denies SI, HI or AVH.  Rates anxiety and depression 10/10.  Requested Ativan. Explained to patient it is scheduled and not due for 1.5 hours.  Patient responded, "I lied when I told you I don't want to hurt myself.  I do.  Can I get my Ativan now?"  Informed patient I could not give it earlier than scheduled time.  Offered Atarax, patient consented and given as ordered.  Asked patient if he had a plan to kill himself, states he would cut his wrists.  Patient verbally contracted for safety while on unit.    Patient somatic & preoccupied with multiple physical complaints.  Reports loose stool this am and "head all congested up" MD notified.  Complains of nausea.  Zofran given.  Reports back pain 8/10.  Tylenol given.  Reports "I just don't have an appetite."  Ate 50% of breakfast and 100% of nutritional supplements.  No difficulty swallowing meds whole with water.  Ambulates with walker.  Compliant with all meds.  Will continue to monitor with Q 15 min safety rounds.  Problem: Education: Goal: Knowledge of [facility] Education information/materials will improve Outcome: Progressing Goal: Emotional status will improve 05/13/2021 1001 by Conard Novak, RN Outcome: Progressing 05/13/2021 1000 by Conard Novak, RN Outcome: Progressing Goal: Mental status will improve 05/13/2021 1001 by Conard Novak, RN Outcome: Progressing 05/13/2021 1000 by Conard Novak, RN Outcome: Progressing Goal: Verbalization of understanding the information provided will improve 05/13/2021 1001 by Conard Novak, RN Outcome: Progressing 05/13/2021 1000 by Conard Novak, RN Outcome: Progressing   Problem: Activity: Goal: Interest or engagement in activities will improve Outcome: Progressing Goal: Sleeping patterns will improve 05/13/2021 1001 by  Conard Novak, RN Outcome: Progressing 05/13/2021 1000 by Conard Novak, RN Outcome: Progressing   Problem: Health Behavior/Discharge Planning: Goal: Identification of resources available to assist in meeting health care needs will improve Outcome: Progressing Goal: Compliance with treatment plan for underlying cause of condition will improve Outcome: Progressing   Problem: Physical Regulation: Goal: Ability to maintain clinical measurements within normal limits will improve Outcome: Progressing   Problem: Safety: Goal: Periods of time without injury will increase Outcome: Progressing

## 2021-05-13 NOTE — Progress Notes (Signed)
[facility] MD Progress Note  05/13/2021 12:29 PM Anthony Lamb  MRN:  [id number] Subjective:  He continues to ruminate on being depressed. He worries that he can't get into his apartment. Makes statements that he needs Ativan but the nurses tell him it's not time and then he states he's suicidal.   Principal Problem: MDD (major depressive disorder), recurrent episode, with atypical features ([place]) Diagnosis: Principal Problem:   MDD (major depressive disorder), recurrent episode, with atypical features ([place]) Active Problems:   Essential hypertension   PUD (peptic ulcer disease)   Malnutrition of moderate degree ([place])   Alcohol abuse  Total Time spent with patient: 15 minutes  Past Psychiatric History: YES  Past Medical History:  Past Medical History:  Diagnosis Date   Alcohol abuse, in remission    Anal fissure    Anemia    Ankylosing spondylitis (HCC)    Anxiety    Arthritis    Chronic diarrhea    Chronic headaches    Chronic pain    Colitis    Colon polyps    Depression    Esophagitis    Gastric polyps    hyperplastic and fundic gland   Gastritis    GERD (gastroesophageal reflux disease)    Hypertension    IBS (irritable bowel syndrome)    Poor dentition    SIADH (syndrome of inappropriate ADH production) ([place])     Past Surgical History:  Procedure Laterality Date   BIOPSY  11/26/2018   Procedure: BIOPSY;  Surgeon: Gatha Mayer, MD;  Location: [facility];  Service: Endoscopy;;   COLONOSCOPY W/ BIOPSIES     COLONOSCOPY WITH PROPOFOL N/A 11/26/2018   Procedure: COLONOSCOPY WITH PROPOFOL;  Surgeon: Gatha Mayer, MD;  Location: [facility];  Service: Endoscopy;  Laterality: N/A;   ESOPHAGOGASTRODUODENOSCOPY     ESOPHAGOGASTRODUODENOSCOPY (EGD) WITH PROPOFOL N/A 11/26/2018   Procedure: ESOPHAGOGASTRODUODENOSCOPY (EGD) WITH PROPOFOL;  Surgeon: Gatha Mayer, MD;  Location: [facility];  Service: Endoscopy;  Laterality: N/A;   HEMOSTASIS CLIP PLACEMENT  11/26/2018    Procedure: HEMOSTASIS CLIP PLACEMENT;  Surgeon: Gatha Mayer, MD;  Location: [facility];  Service: Endoscopy;;   HERNIA REPAIR Bilateral    HOT HEMOSTASIS N/A 11/26/2018   Procedure: HOT HEMOSTASIS (ARGON PLASMA COAGULATION/BICAP);  Surgeon: Gatha Mayer, MD;  Location: [facility];  Service: Endoscopy;  Laterality: N/A;   OPEN REDUCTION INTERNAL FIXATION (ORIF) DISTAL RADIAL FRACTURE Right 02/11/2018   Procedure: OPEN REDUCTION INTERNAL FIXATION (ORIF) DISTAL RADIAL FRACTURE;  Surgeon: Milly Jakob, MD;  Location: [facility];  Service: Orthopedics;  Laterality: Right;   POLYPECTOMY  11/26/2018   Procedure: POLYPECTOMY;  Surgeon: Gatha Mayer, MD;  Location: [facility];  Service: Endoscopy;;   TONSILLECTOMY     Family History:  Family History  Problem Relation Age of Onset   Anxiety disorder Mother    Congestive Heart Failure Mother    Crohn's disease Mother    Colon cancer Mother 84   Arthritis Father    High blood pressure Father    Crohn's disease Father    Family Psychiatric  History: Unremarkable Social History:  Social History   Substance and Sexual Activity  Alcohol Use Not Currently   Comment: 2 25 oz of beer a night     Social History   Substance and Sexual Activity  Drug Use Yes   Types: Marijuana    Social History   Socioeconomic History   Marital status: Single    Spouse name: Not  on file   Number of children: 0   Years of education: Not on file   Highest education level: Not on file  Occupational History   Not on file  Tobacco Use   Smoking status: Former    Types: Cigarettes    Quit date: 2014    Years since quitting: 8.9   Smokeless tobacco: Never  Vaping Use   Vaping Use: Never used  Substance and Sexual Activity   Alcohol use: Not Currently    Comment: 2 25 oz of beer a night   Drug use: Yes    Types: Marijuana   Sexual activity: Not Currently  Other Topics Concern   Not on file  Social History Narrative   HSG. Long - term  disability - unable to work. Lived with his mother in her house - she died April 26, 2023 -    Lives in apartment   Has case worker   Social Determinants of Radio broadcast assistant Strain: Not on file  Food Insecurity: Not on file  Transportation Needs: Not on file  Physical Activity: Not on file  Stress: Not on file  Social Connections: Not on file   Additional Social History:                         Sleep: Good  Appetite:  Good  Current Medications: Current Facility-Administered Medications  Medication Dose Route Frequency Provider Last Rate Last Admin   acetaminophen (TYLENOL) tablet 650 mg  650 mg Oral Q6H PRN Parks Ranger, DO   650 mg at 05/13/21 0753   alum & mag hydroxide-simeth (MAALOX/MYLANTA) 200-200-20 MG/5ML suspension 30 mL  30 mL Oral Q4H PRN Parks Ranger, DO   30 mL at 05/12/21 1836   feeding supplement (ENSURE ENLIVE / ENSURE PLUS) liquid 237 mL  237 mL Oral TID BM Clapacs, John T, MD   237 mL at 05/13/21 0922   FLUoxetine (PROZAC) capsule 20 mg  20 mg Oral Daily Parks Ranger, DO   20 mg at 05/13/21 9381   hydrOXYzine (ATARAX) tablet 25 mg  25 mg Oral TID PRN Parks Ranger, DO   25 mg at 05/13/21 0753   lisinopril (ZESTRIL) tablet 10 mg  10 mg Oral Daily Parks Ranger, DO   10 mg at 05/13/21 0175   LORazepam (ATIVAN) tablet 1 mg  1 mg Oral TID Parks Ranger, DO   1 mg at 05/13/21 1025   magnesium hydroxide (MILK OF MAGNESIA) suspension 30 mL  30 mL Oral Daily PRN Parks Ranger, DO       multivitamin with minerals tablet 1 tablet  1 tablet Oral Daily Parks Ranger, DO   1 tablet at 05/13/21 8527   neomycin-bacitracin-polymyxin (NEOSPORIN) ointment   Topical BID Parks Ranger, DO   Given at 05/12/21 7824   ondansetron (ZOFRAN-ODT) disintegrating tablet 4 mg  4 mg Oral Q4H PRN Parks Ranger, DO   4 mg at 05/13/21 1036   pantoprazole (PROTONIX) EC tablet 40 mg   40 mg Oral Daily Clapacs, Madie Reno, MD   40 mg at 05/13/21 0921   propranolol (INDERAL) tablet 10 mg  10 mg Oral BID Clapacs, Madie Reno, MD   10 mg at 05/13/21 2353   QUEtiapine (SEROQUEL) tablet 300 mg  300 mg Oral QHS Clapacs, John T, MD   300 mg at 05/12/21 2100   risperiDONE (RISPERDAL) tablet 0.5 mg  0.5  mg Oral BID Parks Ranger, DO       sucralfate (CARAFATE) 1 GM/10ML suspension 1 g  1 g Oral TID WC & HS Clapacs, Madie Reno, MD   1 g at 05/13/21 0753   traZODone (DESYREL) tablet 50 mg  50 mg Oral QHS Parks Ranger, DO   50 mg at 05/12/21 2101   verapamil (CALAN-SR) CR tablet 240 mg  240 mg Oral Daily Parks Ranger, DO   240 mg at 05/13/21 0254   zolpidem (AMBIEN) tablet 10 mg  10 mg Oral QHS Clapacs, Madie Reno, MD   10 mg at 05/12/21 2101    Lab Results: No results found for this or any previous visit (from the past 48 hour(s)).  Blood Alcohol level:  Lab Results  Component Value Date   ETH <10 05/03/2021   ETH <10 27/11/2374    Metabolic Disorder Labs: Lab Results  Component Value Date   HGBA1C 5.4 05/07/2021   MPG 108 05/07/2021   MPG 105 11/12/2016   No results found for: PROLACTIN Lab Results  Component Value Date   CHOL 183 05/07/2021   TRIG 79 05/07/2021   HDL 69 05/07/2021   CHOLHDL 2.7 05/07/2021   VLDL 16 05/07/2021   LDLCALC 98 05/07/2021   LDLCALC 108 (H) 11/12/2016    Physical Findings: AIMS:  , ,  ,  ,    CIWA:    COWS:     Musculoskeletal: Strength & Muscle Tone: within normal limits Gait & Station: unsteady Patient leans: N/A  Psychiatric Specialty Exam:  Presentation  General Appearance: Casual  Eye Contact:Good  Speech:Clear and Coherent  Speech Volume:Normal  Handedness:Right   Mood and Affect  Mood:Euthymic  Affect:Congruent; Appropriate   Thought Process  Thought Processes:Coherent; Goal Directed  Descriptions of Associations:Intact  Orientation:Full (Time, Place and Person)  Thought  Content:Logical  History of Schizophrenia/Schizoaffective disorder:No  Duration of Psychotic Symptoms:Less than six months  Hallucinations:No data recorded Ideas of Reference:None  Suicidal Thoughts:No data recorded Homicidal Thoughts:No data recorded  Sensorium  Memory:Immediate Good; Recent Good; Remote Good  Judgment:Good  Insight:Good   Executive Functions  Concentration:Good  Attention Span:Good  [place]  Language:Good   Psychomotor Activity  Psychomotor Activity:No data recorded  Assets  Assets:Communication Skills; Desire for Improvement; Financial Resources/Insurance; Housing; Leisure Time; Resilience   Sleep  Sleep:No data recorded   Physical Exam: Physical Exam Vitals and nursing note reviewed.  Constitutional:      Appearance: Normal appearance. He is normal weight.  Neurological:     General: No focal deficit present.     Mental Status: He is alert and oriented to person, place, and time.  Psychiatric:        Attention and Perception: Attention and perception normal.        Mood and Affect: Mood normal.        Speech: Speech normal.        Behavior: Behavior is agitated.        Thought Content: Thought content includes suicidal ideation.        Cognition and Memory: Cognition normal.        Judgment: Judgment normal.   Review of Systems  Constitutional: Negative.   HENT: Negative.    Eyes: Negative.   Respiratory: Negative.    Cardiovascular: Negative.   Gastrointestinal: Negative.   Genitourinary: Negative.   Musculoskeletal: Negative.   Skin: Negative.   Neurological: Negative.   Endo/Heme/Allergies: Negative.   Psychiatric/Behavioral:  Positive for depression and suicidal ideas.   Blood pressure 127/64, pulse 78, temperature 98 F (36.7 C), temperature source Oral, resp. rate 18, height 5' 10"  (1.778 m), weight 65.8 kg, SpO2 98 %. Body mass index is 20.81 kg/m.   Treatment Plan Summary: Daily  contact with patient to assess and evaluate symptoms and progress in treatment, Medication management, and Plan Start Risperdal 0.5 mg BID for negative thoughts.  Parks Ranger, DO 05/13/2021, 12:29 PM

## 2021-05-13 NOTE — Progress Notes (Signed)
Pt lying in bed with eyes open; calm, cooperative. Pt states that he feels "not real good". Pt c/o neck and back pain due to "spine condition", which he rates 8/10 on 0-10 pain scale. Pt endorses SI with plan to cut his wrist because he has "too many problems". Pt states that he has never attempted suicide; he has just had thoughts. Pt denies HI/AVH at this time. Pt reports that he has trouble falling asleep and staying asleep. Pt describes his appetite as poor because he has no teeth and no dentures. Pt states that he has been receiving soft foods at meals and snack time. Pt reports "severe" anxiety, which he rates 10/10 because he is "worried about every thing" including his physical health and losing his apartment. Pt reports that he needs to pay his rent, which was due on the 1st and now he will be charged late fee. He also states that he has feelings of depression, which he rates 10/10 for the same reasons as his anxiety. He states that he is not going to a nursing home if he loses his apartment. Pt informed that there are resources available through social services that may be able to assist him with paying rent and that he may speak with a hospital social worker, who may be able to assist him. No acute distress noted.

## 2021-05-14 NOTE — Progress Notes (Signed)
Patient is endorsing SI and continues to say, "I have too many problems". This Probation officer provided support and encouragement. However, patient repeats that he has too many problems and that they make him want to kill himself. Patient also appears to have somatic complaints. He comes to the nurses station multiple times to ask for his Ativan. Patient was educated on what time he can receive Ativan. Patient remains safe on the unit at this time.

## 2021-05-14 NOTE — BH IP Treatment Plan (Signed)
Interdisciplinary Treatment and Diagnostic Plan Update  05/14/2021 Time of Session: 12:00PM Anthony Lamb MRN: [id number]  Principal Diagnosis: MDD (major depressive disorder), recurrent episode, with atypical features ([place])  Secondary Diagnoses: Principal Problem:   MDD (major depressive disorder), recurrent episode, with atypical features ([place]) Active Problems:   Essential hypertension   PUD (peptic ulcer disease)   Malnutrition of moderate degree ([place])   Alcohol abuse   Current Medications:  Current Facility-Administered Medications  Medication Dose Route Frequency Provider Last Rate Last Admin   acetaminophen (TYLENOL) tablet 650 mg  650 mg Oral Q6H PRN Parks Ranger, DO   650 mg at 05/14/21 1603   alum & mag hydroxide-simeth (MAALOX/MYLANTA) 200-200-20 MG/5ML suspension 30 mL  30 mL Oral Q4H PRN Parks Ranger, DO   30 mL at 05/13/21 1909   feeding supplement (ENSURE ENLIVE / ENSURE PLUS) liquid 237 mL  237 mL Oral TID BM Clapacs, John T, MD   237 mL at 05/14/21 1318   FLUoxetine (PROZAC) capsule 20 mg  20 mg Oral Daily Parks Ranger, DO   20 mg at 05/14/21 6644   hydrOXYzine (ATARAX) tablet 25 mg  25 mg Oral TID PRN Parks Ranger, DO   25 mg at 05/14/21 1318   lisinopril (ZESTRIL) tablet 10 mg  10 mg Oral Daily Parks Ranger, DO   10 mg at 05/14/21 0902   LORazepam (ATIVAN) tablet 1 mg  1 mg Oral TID Parks Ranger, DO   1 mg at 05/14/21 1602   magnesium hydroxide (MILK OF MAGNESIA) suspension 30 mL  30 mL Oral Daily PRN Parks Ranger, DO       multivitamin with minerals tablet 1 tablet  1 tablet Oral Daily Parks Ranger, DO   1 tablet at 05/14/21 0347   neomycin-bacitracin-polymyxin (NEOSPORIN) ointment   Topical BID Parks Ranger, DO   1 application at 42/59/56 0910   ondansetron (ZOFRAN-ODT) disintegrating tablet 4 mg  4 mg Oral Q4H PRN Parks Ranger, DO   4 mg at 05/14/21 0910    pantoprazole (PROTONIX) EC tablet 40 mg  40 mg Oral Daily Clapacs, John T, MD   40 mg at 05/14/21 0902   propranolol (INDERAL) tablet 10 mg  10 mg Oral BID Clapacs, John T, MD   10 mg at 05/14/21 3875   QUEtiapine (SEROQUEL) tablet 300 mg  300 mg Oral QHS Clapacs, John T, MD   300 mg at 05/13/21 2104   risperiDONE (RISPERDAL) tablet 0.5 mg  0.5 mg Oral BID Parks Ranger, DO   0.5 mg at 05/14/21 0902   sucralfate (CARAFATE) 1 GM/10ML suspension 1 g  1 g Oral TID WC & HS Clapacs, John T, MD   1 g at 05/14/21 1602   traZODone (DESYREL) tablet 50 mg  50 mg Oral QHS Parks Ranger, DO   50 mg at 05/13/21 2104   verapamil (CALAN-SR) CR tablet 240 mg  240 mg Oral Daily Parks Ranger, DO   240 mg at 05/14/21 0902   zolpidem (AMBIEN) tablet 10 mg  10 mg Oral QHS Clapacs, John T, MD   10 mg at 05/13/21 2104   PTA Medications: Medications Prior to Admission  Medication Sig Dispense Refill Last Dose   acetaminophen (TYLENOL) 500 MG tablet Take 500-1,000 mg by mouth every 6 (six) hours as needed (for pain).      chlordiazePOXIDE (LIBRIUM) 25 MG capsule 45m PO TID x 1D, then 25-516m  PO BID X 1D, then 25-42m PO QD X 1D (Patient not taking: Reported on 05/03/2021) 10 capsule 0    diclofenac Sodium (VOLTAREN) 1 % GEL Apply 2 g topically 4 (four) times daily as needed (to painful sites). (Patient not taking: Reported on 05/03/2021)      famotidine (PEPCID) 20 MG tablet Take 1 tablet (20 mg total) by mouth 2 (two) times daily. (Patient not taking: Reported on 04/26/2021) 30 tablet 0    lisinopril (PRINIVIL,ZESTRIL) 10 MG tablet Take 1 tablet (10 mg total) by mouth daily. 30 tablet 0    LORazepam (ATIVAN) 1 MG tablet Take 1 mg by mouth 3 (three) times daily.      Multiple Vitamin (MULTIVITAMIN WITH MINERALS) TABS tablet Take 1 tablet by mouth daily. (May buy over the counter) (Patient taking differently: Take 1 tablet by mouth daily.) 1 tablet 0    ondansetron (ZOFRAN ODT) 4 MG  disintegrating tablet Take 1 tablet (4 mg total) by mouth every 4 (four) hours as needed for nausea or vomiting. (Patient not taking: Reported on 05/03/2021) 20 tablet 0    ondansetron (ZOFRAN-ODT) 4 MG disintegrating tablet Take 1 tablet (4 mg total) by mouth every 4 (four) hours as needed for nausea or vomiting. (Patient not taking: Reported on 05/03/2021) 20 tablet 0    pantoprazole (PROTONIX) 40 MG tablet Take 1 tablet (40 mg total) by mouth daily. (Patient taking differently: Take 40 mg by mouth in the morning.) 30 tablet 11    propranolol (INDERAL) 10 MG tablet Take 10 mg by mouth 2 (two) times daily.      QUEtiapine (SEROQUEL) 300 MG tablet Take 300 mg by mouth at bedtime.      sucralfate (CARAFATE) 1 g tablet Take 1 g by mouth 4 (four) times daily.      sucralfate (CARAFATE) 1 GM/10ML suspension Take 10 mLs (1 g total) by mouth 4 (four) times daily -  with meals and at bedtime. (Patient not taking: Reported on 05/03/2021) 420 mL 0    tiZANidine (ZANAFLEX) 4 MG tablet Take 4 mg by mouth 3 (three) times daily as needed for muscle spasms.      verapamil (CALAN-SR) 240 MG CR tablet Take 240 mg by mouth daily.  2    zolpidem (AMBIEN) 5 MG tablet Take 5 mg by mouth at bedtime.       Patient Stressors:    Patient Strengths:    Treatment Modalities: Medication Management, Group therapy, Case management,  1 to 1 session with clinician, Psychoeducation, Recreational therapy.   Physician Treatment Plan for Primary Diagnosis: MDD (major depressive disorder), recurrent episode, with atypical features ([place] Long Term Goal(s): Improvement in symptoms so as ready for discharge   Short Term Goals: Ability to maintain clinical measurements within normal limits will improve Compliance with prescribed medications will improve Ability to verbalize feelings will improve Ability to disclose and discuss suicidal ideas Ability to demonstrate self-control will improve  Medication Management: Evaluate  patient's response, side effects, and tolerance of medication regimen.  Therapeutic Interventions: 1 to 1 sessions, Unit Group sessions and Medication administration.  Evaluation of Outcomes: Not Progressing  Physician Treatment Plan for Secondary Diagnosis: Principal Problem:   MDD (major depressive disorder), recurrent episode, with atypical features ([place] Active Problems:   Essential hypertension   PUD (peptic ulcer disease)   Malnutrition of moderate degree ([facility])   Alcohol abuse  Long Term Goal(s): Improvement in symptoms so as ready for discharge   Short Term Goals: Ability  to maintain clinical measurements within normal limits will improve Compliance with prescribed medications will improve Ability to verbalize feelings will improve Ability to disclose and discuss suicidal ideas Ability to demonstrate self-control will improve     Medication Management: Evaluate patient's response, side effects, and tolerance of medication regimen.  Therapeutic Interventions: 1 to 1 sessions, Unit Group sessions and Medication administration.  Evaluation of Outcomes: Not Progressing   RN Treatment Plan for Primary Diagnosis: MDD (major depressive disorder), recurrent episode, with atypical features ([place]) Long Term Goal(s): Knowledge of disease and therapeutic regimen to maintain health will improve  Short Term Goals: Ability to remain free from injury will improve, Ability to verbalize frustration and anger appropriately will improve, Ability to demonstrate self-control, Ability to participate in decision making will improve, Ability to verbalize feelings will improve, Ability to disclose and discuss suicidal ideas, Ability to identify and develop effective coping behaviors will improve, and Compliance with prescribed medications will improve  Medication Management: RN will administer medications as ordered by provider, will assess and evaluate patient's response and provide education to  patient for prescribed medication. RN will report any adverse and/or side effects to prescribing provider.  Therapeutic Interventions: 1 on 1 counseling sessions, Psychoeducation, Medication administration, Evaluate responses to treatment, Monitor vital signs and CBGs as ordered, Perform/monitor CIWA, COWS, AIMS and Fall Risk screenings as ordered, Perform wound care treatments as ordered.  Evaluation of Outcomes: Not Progressing   LCSW Treatment Plan for Primary Diagnosis: MDD (major depressive disorder), recurrent episode, with atypical features ([place]) Long Term Goal(s): Safe transition to appropriate next level of care at discharge, Engage patient in therapeutic group addressing interpersonal concerns.  Short Term Goals: Engage patient in aftercare planning with referrals and resources, Increase social support, Increase ability to appropriately verbalize feelings, Increase emotional regulation, Facilitate acceptance of mental health diagnosis and concerns, Identify triggers associated with mental health/substance abuse issues, and Increase skills for wellness and recovery  Therapeutic Interventions: Assess for all discharge needs, 1 to 1 time with Social worker, Explore available resources and support systems, Assess for adequacy in community support network, Educate family and significant other(s) on suicide prevention, Complete Psychosocial Assessment, Interpersonal group therapy.  Evaluation of Outcomes: Not Progressing   Progress in Treatment: Attending groups: No. Participating in groups: No. Taking medication as prescribed: Yes. Toleration medication: Yes. Family/Significant other contact made: No, will contact:  patient's therapist Patient understands diagnosis: Yes. Discussing patient identified problems/goals with staff: Yes. Medical problems stabilized or resolved: Yes. Denies suicidal/homicidal ideation: No. Issues/concerns per patient self-inventory: No. Other: None.  New  problem(s) identified: No, Describe:  None.   New Short Term/Long Term Goal(s): medication management for mood stabilization; elimination of SI thoughts; development of comprehensive mental wellness plan. Update 05/14/2021: No changes at this time.   Patient Goals:  "To get better...to get everything straightened out." Update 05/14/2021: No changes at this time.   Discharge Plan or Barriers: CSW will assist pt with development of appropriate discharge/aftercare plan. Update 05/14/2021: No changes at this time.   Reason for Continuation of Hospitalization: Anxiety Depression Medication stabilization Suicidal ideation  Estimated Length of Stay:   Scribe for Treatment Team: Sherilyn Dacosta 05/14/2021 5:57 PM

## 2021-05-14 NOTE — Progress Notes (Signed)
Pt approached nurses' station to report that he endorsed SI during nursing assessment but he did not want to say anything, but he thought about it and wanted this nurse to know that he is currently endorsing SI with plan to cut his wrist. Pt states that he does not want to feel that way because he does not want to risk not getting into heaven. Pt denies prior suicidal attempts. No acute distress noted.

## 2021-05-14 NOTE — Group Note (Signed)
LCSW Group Therapy Note  Group Date: 05/14/2021 Start Time: 1504 End Time: 1545   Type of Therapy and Topic:  Group Therapy - Healthy vs Unhealthy Coping Skills  Participation Level:  Minimal   Description of Group The focus of this group was to determine what unhealthy coping techniques typically are used by group members and what healthy coping techniques would be helpful in coping with various problems. Patients were guided in becoming aware of the differences between healthy and unhealthy coping techniques. Patients were asked to identify 2-3 healthy coping skills they would like to learn to use more effectively.  Therapeutic Goals Patients learned that coping is what human beings do all day long to deal with various situations in their lives Patients defined and discussed healthy vs unhealthy coping techniques Patients identified their preferred coping techniques and identified whether these were healthy or unhealthy Patients determined 2-3 healthy coping skills they would like to become more familiar with and use more often. Patients provided support and ideas to each other   Summary of Patient Progress:  Patient presented a few minutes after the start of group. Patient remained fixated on ruminating worries, sharing about his concern that he is going to be placed in a nursing home by adult protective services and stated, "I've got a lot of problems right now. Physical and mental." When asked to identify a coping skill, patient was not able to identify any.    Therapeutic Modalities Cognitive Behavioral Therapy Motivational Interviewing  Berniece Salines, Latanya Presser 05/14/2021  4:53 PM

## 2021-05-14 NOTE — Progress Notes (Signed)
[facility] MD Progress Note  05/14/2021 3:16 PM Anthony Lamb  MRN:  [id number] Subjective: Anthony Lamb is seen and examined today.  He seems a little calmer today after starting Risperdal yesterday.  He still complains of anxiety and depression and makes statements that he is going to kill himself but that seems to be chronic and his caregiver says that is who he is.  He continues to perseverate on his nighttime medications.  He is tolerating his medicines and taking them as prescribed.  He denies any side effects. Principal Problem: MDD (major depressive disorder), recurrent episode, with atypical features ([place]) Diagnosis: Principal Problem:   MDD (major depressive disorder), recurrent episode, with atypical features ([place]) Active Problems:   Essential hypertension   PUD (peptic ulcer disease)   Malnutrition of moderate degree ([place])   Alcohol abuse  Total Time spent with patient: 15 minutes  Past Psychiatric History: Unremarkable  Past Medical History:  Past Medical History:  Diagnosis Date   Alcohol abuse, in remission    Anal fissure    Anemia    Ankylosing spondylitis (HCC)    Anxiety    Arthritis    Chronic diarrhea    Chronic headaches    Chronic pain    Colitis    Colon polyps    Depression    Esophagitis    Gastric polyps    hyperplastic and fundic gland   Gastritis    GERD (gastroesophageal reflux disease)    Hypertension    IBS (irritable bowel syndrome)    Poor dentition    SIADH (syndrome of inappropriate ADH production) ([place])     Past Surgical History:  Procedure Laterality Date   BIOPSY  11/26/2018   Procedure: BIOPSY;  Surgeon: Gatha Mayer, MD;  Location: [facility];  Service: Endoscopy;;   COLONOSCOPY W/ BIOPSIES     COLONOSCOPY WITH PROPOFOL N/A 11/26/2018   Procedure: COLONOSCOPY WITH PROPOFOL;  Surgeon: Gatha Mayer, MD;  [facility];  Service: Endoscopy;  Laterality: N/A;   ESOPHAGOGASTRODUODENOSCOPY     ESOPHAGOGASTRODUODENOSCOPY  (EGD) WITH PROPOFOL N/A 11/26/2018   Procedure: ESOPHAGOGASTRODUODENOSCOPY (EGD) WITH PROPOFOL;  Surgeon: Gatha Mayer, MD;  [facility];  Service: Endoscopy;  Laterality: N/A;   HEMOSTASIS CLIP PLACEMENT  11/26/2018   Procedure: HEMOSTASIS CLIP PLACEMENT;  Surgeon: Gatha Mayer, MD;  [facility];  Service: Endoscopy;;   HERNIA REPAIR Bilateral    HOT HEMOSTASIS N/A 11/26/2018   Procedure: HOT HEMOSTASIS (ARGON PLASMA COAGULATION/BICAP);  Surgeon: Gatha Mayer, MD;  Location: [facility];  Service: Endoscopy;  Laterality: N/A;   OPEN REDUCTION INTERNAL FIXATION (ORIF) DISTAL RADIAL FRACTURE Right 02/11/2018   Procedure: OPEN REDUCTION INTERNAL FIXATION (ORIF) DISTAL RADIAL FRACTURE;  Surgeon: Milly Jakob, MD;  Location: [place];  Service: Orthopedics;  Laterality: Right;   POLYPECTOMY  11/26/2018   Procedure: POLYPECTOMY;  Surgeon: Gatha Mayer, MD;  Location: [facility];  Service: Endoscopy;;   TONSILLECTOMY     Family History:  Family History  Problem Relation Age of Onset   Anxiety disorder Mother    Congestive Heart Failure Mother    Crohn's disease Mother    Colon cancer Mother 19   Arthritis Father    High blood pressure Father    Crohn's disease Father    Family Psychiatric  History: Unremarkable Social History:  Social History   Substance and Sexual Activity  Alcohol Use Not Currently   Comment: 2 25 oz of beer a night  Social History   Substance and Sexual Activity  Drug Use Yes   Types: Marijuana    Social History   Socioeconomic History   Marital status: Single    Spouse name: Not on file   Number of children: 0   Years of education: Not on file   Highest education level: Not on file  Occupational History   Not on file  Tobacco Use   Smoking status: Former    Types: Cigarettes    Quit date: 2014    Years since quitting: 8.9   Smokeless tobacco: Never  Vaping Use   Vaping Use: Never used  Substance and Sexual  Activity   Alcohol use: Not Currently    Comment: 2 25 oz of beer a night   Drug use: Yes    Types: Marijuana   Sexual activity: Not Currently  Other Topics Concern   Not on file  Social History Narrative   HSG. Long - term disability - unable to work. Lived with his mother in her house - she died May 01, 2023 -    Lives in apartment   Has case worker   Social Determinants of Radio broadcast assistant Strain: Not on file  Food Insecurity: Not on file  Transportation Needs: Not on file  Physical Activity: Not on file  Stress: Not on file  Social Connections: Not on file   Additional Social History:                         Sleep: Good  Appetite:  Good  Current Medications: Current Facility-Administered Medications  Medication Dose Route Frequency Provider Last Rate Last Admin   acetaminophen (TYLENOL) tablet 650 mg  650 mg Oral Q6H PRN Parks Ranger, DO   650 mg at 05/14/21 0902   alum & mag hydroxide-simeth (MAALOX/MYLANTA) 200-200-20 MG/5ML suspension 30 mL  30 mL Oral Q4H PRN Parks Ranger, DO   30 mL at 05/13/21 1909   feeding supplement (ENSURE ENLIVE / ENSURE PLUS) liquid 237 mL  237 mL Oral TID BM Clapacs, John T, MD   237 mL at 05/14/21 1318   FLUoxetine (PROZAC) capsule 20 mg  20 mg Oral Daily Parks Ranger, DO   20 mg at 05/14/21 2725   hydrOXYzine (ATARAX) tablet 25 mg  25 mg Oral TID PRN Parks Ranger, DO   25 mg at 05/14/21 1318   lisinopril (ZESTRIL) tablet 10 mg  10 mg Oral Daily Parks Ranger, DO   10 mg at 05/14/21 0902   LORazepam (ATIVAN) tablet 1 mg  1 mg Oral TID Parks Ranger, DO   1 mg at 05/14/21 0902   magnesium hydroxide (MILK OF MAGNESIA) suspension 30 mL  30 mL Oral Daily PRN Parks Ranger, DO       multivitamin with minerals tablet 1 tablet  1 tablet Oral Daily Parks Ranger, DO   1 tablet at 05/14/21 3664   neomycin-bacitracin-polymyxin (NEOSPORIN) ointment    Topical BID Parks Ranger, DO   1 application at 40/34/74 0910   ondansetron (ZOFRAN-ODT) disintegrating tablet 4 mg  4 mg Oral Q4H PRN Parks Ranger, DO   4 mg at 05/14/21 0910   pantoprazole (PROTONIX) EC tablet 40 mg  40 mg Oral Daily Clapacs, Madie Reno, MD   40 mg at 05/14/21 0902   propranolol (INDERAL) tablet 10 mg  10 mg Oral BID Clapacs, Madie Reno, MD  10 mg at 05/14/21 5681   QUEtiapine (SEROQUEL) tablet 300 mg  300 mg Oral QHS Clapacs, John T, MD   300 mg at 05/13/21 2104   risperiDONE (RISPERDAL) tablet 0.5 mg  0.5 mg Oral BID Parks Ranger, DO   0.5 mg at 05/14/21 0902   sucralfate (CARAFATE) 1 GM/10ML suspension 1 g  1 g Oral TID WC & HS Clapacs, John T, MD   1 g at 05/14/21 1152   traZODone (DESYREL) tablet 50 mg  50 mg Oral QHS Parks Ranger, DO   50 mg at 05/13/21 2104   verapamil (CALAN-SR) CR tablet 240 mg  240 mg Oral Daily Parks Ranger, DO   240 mg at 05/14/21 0902   zolpidem (AMBIEN) tablet 10 mg  10 mg Oral QHS Clapacs, Madie Reno, MD   10 mg at 05/13/21 2104    Lab Results: No results found for this or any previous visit (from the past 48 hour(s)).  Blood Alcohol level:  Lab Results  Component Value Date   ETH <10 05/03/2021   ETH <10 27/51/7001    Metabolic Disorder Labs: Lab Results  Component Value Date   HGBA1C 5.4 05/07/2021   MPG 108 05/07/2021   MPG 105 11/12/2016   No results found for: PROLACTIN Lab Results  Component Value Date   CHOL 183 05/07/2021   TRIG 79 05/07/2021   HDL 69 05/07/2021   CHOLHDL 2.7 05/07/2021   VLDL 16 05/07/2021   LDLCALC 98 05/07/2021   LDLCALC 108 (H) 11/12/2016    Physical Findings: AIMS:  , ,  ,  ,    CIWA:    COWS:     Musculoskeletal: Strength & Muscle Tone: within normal limits Gait & Station: normal Patient leans: N/A  Psychiatric Specialty Exam:  Presentation  General Appearance: Casual  Eye Contact:Good  Speech:Clear and Coherent  Speech  Volume:Normal  Handedness:Right   Mood and Affect  Mood:Euthymic  Affect:Congruent; Appropriate   Thought Process  Thought Processes:Coherent; Goal Directed  Descriptions of Associations:Intact  Orientation:Full (Time, Place and Person)  Thought Content:Logical  History of Schizophrenia/Schizoaffective disorder:No  Duration of Psychotic Symptoms:Less than six months  Hallucinations:No data recorded Ideas of Reference:None  Suicidal Thoughts:No data recorded Homicidal Thoughts:No data recorded  Sensorium  Memory:Immediate Good; Recent Good; Remote Good  Judgment:Good  Insight:Good   Executive Functions  Concentration:Good  Attention Span:Good  [place]  Language:Good   Psychomotor Activity  Psychomotor Activity:No data recorded  Assets  Assets:Communication Skills; Desire for Improvement; Financial Resources/Insurance; Housing; Leisure Time; Resilience   Sleep  Sleep:No data recorded   Physical Exam: Physical Exam Vitals and nursing note reviewed.  Constitutional:      Appearance: Normal appearance. He is normal weight.  Neurological:     General: No focal deficit present.     Mental Status: He is alert and oriented to person, place, and time.  Psychiatric:        Attention and Perception: Attention and perception normal.        Mood and Affect: Mood is anxious and depressed. Affect is flat.        Speech: Speech normal.        Behavior: Behavior is uncooperative.        Thought Content: Thought content is paranoid. Thought content includes suicidal ideation.        Cognition and Memory: Cognition is impaired. He exhibits impaired recent memory.        Judgment:  Judgment is impulsive.   Review of Systems  Constitutional: Negative.   HENT: Negative.    Eyes: Negative.   Respiratory: Negative.    Cardiovascular: Negative.   Gastrointestinal: Negative.   Genitourinary: Negative.   Musculoskeletal: Negative.    Skin: Negative.   Neurological: Negative.   Endo/Heme/Allergies: Negative.   Psychiatric/Behavioral:  Positive for depression and suicidal ideas.   Blood pressure 130/81, pulse 91, temperature 98.3 F (36.8 C), temperature source Oral, resp. rate 18, height 5' 10"  (1.778 m), weight 65.8 kg, SpO2 97 %. Body mass index is 20.81 kg/m.   Treatment Plan Summary: Daily contact with patient to assess and evaluate symptoms and progress in treatment, Medication management, and Plan continue current medications.  Parks Ranger, DO 05/14/2021, 3:16 PM

## 2021-05-14 NOTE — Progress Notes (Signed)
Pt standing in room; calm, cooperative. Pt states "I feel bad; very bad." Pt c/o of "real bad" heartburn and requested maalox. Pt also c/o chronic neck pain, which he rates 7/10 on 0-10 pain scale. Pt denies SI/HI/AVH at this time. Pt states that he slept "bad; not very well at all" last night. Pt describes his appetite as "poor" and states "I think I have throat cancer." Pt asked if he has visited an ENT doctor. He states that he did "but they only did a swallow test and you need a scope to see if it's cancer". Pt expresses feelings of "very bad" anxiety, which he rates 10/10 and states "I've had it all my life." Pt also expresses feelings of depression, which he rates 8/10 and states "I've got a lot of problems." Pt states that his home is locked and he called about a spare key and was told that he would have to pay $25. Pt encouraged to engage hospital social worker for assistance with finding resources related to his current worries. Pt pessimistic stating "they can't help me". No acute distress noted.

## 2021-05-15 MED ORDER — LOPERAMIDE HCL 2 MG PO CAPS
2.0000 mg | ORAL_CAPSULE | Freq: Every day | ORAL | Status: DC
  Administered 2021-05-15 – 2021-05-20 (×6): 2 mg via ORAL
  Filled 2021-05-15 (×7): qty 1

## 2021-05-15 NOTE — Progress Notes (Signed)
[facility] MD Progress Note  05/15/2021 12:16 PM Anthony Lamb  MRN:  [id number] Subjective: Anthony Lamb continues to perseverate on bodily functions.  He is complaining of diarrhea.  He is taking his medications as prescribed and denies any side effects.  Seems to be doing better but perceptually he does not think so.  He states that he is not ready to go home.  Principal Problem: MDD (major depressive disorder), recurrent episode, with atypical features ([place]) Diagnosis: Principal Problem:   MDD (major depressive disorder), recurrent episode, with atypical features ([place]) Active Problems:   Essential hypertension   PUD (peptic ulcer disease)   Malnutrition of moderate degree ([place])   Alcohol abuse  Total Time spent with patient: 15 minutes  Past Psychiatric History: Yes  Past Medical History:  Past Medical History:  Diagnosis Date   Alcohol abuse, in remission    Anal fissure    Anemia    Ankylosing spondylitis (HCC)    Anxiety    Arthritis    Chronic diarrhea    Chronic headaches    Chronic pain    Colitis    Colon polyps    Depression    Esophagitis    Gastric polyps    hyperplastic and fundic gland   Gastritis    GERD (gastroesophageal reflux disease)    Hypertension    IBS (irritable bowel syndrome)    Poor dentition    SIADH (syndrome of inappropriate ADH production) ([place])     Past Surgical History:  Procedure Laterality Date   BIOPSY  11/26/2018   Procedure: BIOPSY;  Surgeon: Gatha Mayer, MD;  [facility];  Service: Endoscopy;;   COLONOSCOPY W/ BIOPSIES     COLONOSCOPY WITH PROPOFOL N/A 11/26/2018   Procedure: COLONOSCOPY WITH PROPOFOL;  Surgeon: Gatha Mayer, MD;  [facility];  Service: Endoscopy;  Laterality: N/A;   ESOPHAGOGASTRODUODENOSCOPY     ESOPHAGOGASTRODUODENOSCOPY (EGD) WITH PROPOFOL N/A 11/26/2018   Procedure: ESOPHAGOGASTRODUODENOSCOPY (EGD) WITH PROPOFOL;  Surgeon: Gatha Mayer, MD;  [facility];  Service:  Endoscopy;  Laterality: N/A;   HEMOSTASIS CLIP PLACEMENT  11/26/2018   Procedure: HEMOSTASIS CLIP PLACEMENT;  Surgeon: Gatha Mayer, MD;  [facility];  Service: Endoscopy;;   HERNIA REPAIR Bilateral    HOT HEMOSTASIS N/A 11/26/2018   Procedure: HOT HEMOSTASIS (ARGON PLASMA COAGULATION/BICAP);  Surgeon: Gatha Mayer, MD;  Location: [facility];  Service: Endoscopy;  Laterality: N/A;   OPEN REDUCTION INTERNAL FIXATION (ORIF) DISTAL RADIAL FRACTURE Right 02/11/2018   Procedure: OPEN REDUCTION INTERNAL FIXATION (ORIF) DISTAL RADIAL FRACTURE;  Surgeon: Milly Jakob, MD;  Location: [place];  Service: Orthopedics;  Laterality: Right;   POLYPECTOMY  11/26/2018   Procedure: POLYPECTOMY;  Surgeon: Gatha Mayer, MD;  Location: [facility];  Service: Endoscopy;;   TONSILLECTOMY     Family History:  Family History  Problem Relation Age of Onset   Anxiety disorder Mother    Congestive Heart Failure Mother    Crohn's disease Mother    Colon cancer Mother 61   Arthritis Father    High blood pressure Father    Crohn's disease Father    Family Psychiatric  History: Unremarkable Social History:  Social History   Substance and Sexual Activity  Alcohol Use Not Currently   Comment: 2 25 oz of beer a night     Social History   Substance and Sexual Activity  Drug Use Yes   Types: Marijuana    Social History   Socioeconomic  History   Marital status: Single    Spouse name: Not on file   Number of children: 0   Years of education: Not on file   Highest education level: Not on file  Occupational History   Not on file  Tobacco Use   Smoking status: Former    Types: Cigarettes    Quit date: 2014    Years since quitting: 8.9   Smokeless tobacco: Never  Vaping Use   Vaping Use: Never used  Substance and Sexual Activity   Alcohol use: Not Currently    Comment: 2 25 oz of beer a night   Drug use: Yes    Types: Marijuana   Sexual activity: Not Currently  Other Topics  Concern   Not on file  Social History Narrative   HSG. Long - term disability - unable to work. Lived with his mother in her house - she died 19-May-2023 -    Lives in apartment   Has case worker   Social Determinants of Radio broadcast assistant Strain: Not on file  Food Insecurity: Not on file  Transportation Needs: Not on file  Physical Activity: Not on file  Stress: Not on file  Social Connections: Not on file   Additional Social History:                         Sleep: Negative  Appetite:  Fair  Current Medications: Current Facility-Administered Medications  Medication Dose Route Frequency Provider Last Rate Last Admin   acetaminophen (TYLENOL) tablet 650 mg  650 mg Oral Q6H PRN Parks Ranger, DO   650 mg at 05/15/21 0959   alum & mag hydroxide-simeth (MAALOX/MYLANTA) 200-200-20 MG/5ML suspension 30 mL  30 mL Oral Q4H PRN Parks Ranger, DO   30 mL at 05/13/21 1909   feeding supplement (ENSURE ENLIVE / ENSURE PLUS) liquid 237 mL  237 mL Oral TID BM Clapacs, John T, MD   237 mL at 05/15/21 0928   FLUoxetine (PROZAC) capsule 20 mg  20 mg Oral Daily Parks Ranger, DO   20 mg at 05/15/21 9563   hydrOXYzine (ATARAX) tablet 25 mg  25 mg Oral TID PRN Parks Ranger, DO   25 mg at 05/14/21 1318   lisinopril (ZESTRIL) tablet 10 mg  10 mg Oral Daily Parks Ranger, DO   10 mg at 05/15/21 8756   loperamide (IMODIUM) capsule 2 mg  2 mg Oral QPC breakfast Parks Ranger, DO       LORazepam (ATIVAN) tablet 1 mg  1 mg Oral TID Parks Ranger, DO   1 mg at 05/15/21 4332   magnesium hydroxide (MILK OF MAGNESIA) suspension 30 mL  30 mL Oral Daily PRN Parks Ranger, DO       multivitamin with minerals tablet 1 tablet  1 tablet Oral Daily Parks Ranger, DO   1 tablet at 05/15/21 9518   neomycin-bacitracin-polymyxin (NEOSPORIN) ointment   Topical BID Parks Ranger, DO   Given at 05/15/21 8416    ondansetron (ZOFRAN-ODT) disintegrating tablet 4 mg  4 mg Oral Q4H PRN Parks Ranger, DO   4 mg at 05/14/21 0910   pantoprazole (PROTONIX) EC tablet 40 mg  40 mg Oral Daily Clapacs, Madie Reno, MD   40 mg at 05/15/21 0924   propranolol (INDERAL) tablet 10 mg  10 mg Oral BID Clapacs, Madie Reno, MD   10 mg at 05/15/21  8657   QUEtiapine (SEROQUEL) tablet 300 mg  300 mg Oral QHS Clapacs, John T, MD   300 mg at 05/14/21 2119   risperiDONE (RISPERDAL) tablet 0.5 mg  0.5 mg Oral BID Parks Ranger, DO   0.5 mg at 05/15/21 0924   sucralfate (CARAFATE) 1 GM/10ML suspension 1 g  1 g Oral TID WC & HS Clapacs, John T, MD   1 g at 05/15/21 1144   traZODone (DESYREL) tablet 50 mg  50 mg Oral QHS Parks Ranger, DO   50 mg at 05/14/21 2120   verapamil (CALAN-SR) CR tablet 240 mg  240 mg Oral Daily Parks Ranger, DO   240 mg at 05/15/21 8469   zolpidem (AMBIEN) tablet 10 mg  10 mg Oral QHS Clapacs, Madie Reno, MD   10 mg at 05/14/21 2120    Lab Results: No results found for this or any previous visit (from the past 48 hour(s)).  Blood Alcohol level:  Lab Results  Component Value Date   ETH <10 05/03/2021   ETH <10 62/95/2841    Metabolic Disorder Labs: Lab Results  Component Value Date   HGBA1C 5.4 05/07/2021   MPG 108 05/07/2021   MPG 105 11/12/2016   No results found for: PROLACTIN Lab Results  Component Value Date   CHOL 183 05/07/2021   TRIG 79 05/07/2021   HDL 69 05/07/2021   CHOLHDL 2.7 05/07/2021   VLDL 16 05/07/2021   LDLCALC 98 05/07/2021   LDLCALC 108 (H) 11/12/2016    Physical Findings: AIMS:  , ,  ,  ,    CIWA:    COWS:     Musculoskeletal: Strength & Muscle Tone: within normal limits Gait & Station: normal Patient leans: N/A  Psychiatric Specialty Exam:  Presentation  General Appearance: Casual  Eye Contact:Good  Speech:Clear and Coherent  Speech Volume:Normal  Handedness:Right   Mood and Affect   Mood:Euthymic  Affect:Congruent; Appropriate   Thought Process  Thought Processes:Coherent; Goal Directed  Descriptions of Associations:Intact  Orientation:Full (Time, Place and Person)  Thought Content:Logical  History of Schizophrenia/Schizoaffective disorder:No  Duration of Psychotic Symptoms:Less than six months  Hallucinations:No data recorded Ideas of Reference:None  Suicidal Thoughts:No data recorded Homicidal Thoughts:No data recorded  Sensorium  Memory:Immediate Good; Recent Good; Remote Good  Judgment:Good  Insight:Good   Executive Functions  Concentration:Good  Attention Span:Good  [place]  Language:Good   Psychomotor Activity  Psychomotor Activity:No data recorded  Assets  Assets:Communication Skills; Desire for Improvement; Financial Resources/Insurance; Housing; Leisure Time; Resilience   Sleep  Sleep:No data recorded   Physical Exam: Physical Exam Vitals and nursing note reviewed.  Constitutional:      Appearance: Normal appearance. He is normal weight.  HENT:     Head: Normocephalic and atraumatic.     Nose: Nose normal.     Mouth/Throat:     Pharynx: Oropharynx is clear.  Eyes:     Extraocular Movements: Extraocular movements intact.     Pupils: Pupils are equal, round, and reactive to light.  Cardiovascular:     Rate and Rhythm: Normal rate and regular rhythm.     Pulses: Normal pulses.     Heart sounds: Normal heart sounds.  Pulmonary:     Effort: Pulmonary effort is normal.     Breath sounds: Normal breath sounds.  Abdominal:     General: Abdomen is flat. Bowel sounds are normal.     Palpations: Abdomen is soft.  Musculoskeletal:  General: Normal range of motion.     Cervical back: Normal range of motion and neck supple.  Skin:    General: Skin is warm and dry.  Neurological:     General: No focal deficit present.     Mental Status: He is alert and oriented to person, place,  and time.  Psychiatric:        Attention and Perception: Attention and perception normal.        Mood and Affect: Mood is anxious and depressed.        Speech: Speech normal.        Behavior: Behavior normal.        Thought Content: Thought content is paranoid. Thought content includes suicidal ideation.        Cognition and Memory: Cognition normal. He exhibits impaired recent memory.        Judgment: Judgment is impulsive.   Review of Systems  Constitutional: Negative.   HENT: Negative.    Eyes: Negative.   Respiratory: Negative.    Cardiovascular: Negative.   Gastrointestinal: Negative.   Genitourinary: Negative.   Musculoskeletal: Negative.   Skin: Negative.   Neurological: Negative.   Endo/Heme/Allergies: Negative.   Psychiatric/Behavioral:  Positive for depression and suicidal ideas.   Blood pressure 137/76, pulse 80, temperature 98.2 F (36.8 C), temperature source Oral, resp. rate 18, height 5' 10"  (1.778 m), weight 65.8 kg, SpO2 98 %. Body mass index is 20.81 kg/m.   Treatment Plan Summary: Daily contact with patient to assess and evaluate symptoms and progress in treatment, Medication management, and Plan start Imodium 2 mg daily.  Parks Ranger, DO 05/15/2021, 12:16 PM

## 2021-05-15 NOTE — Progress Notes (Signed)
Pt c/o having 3 bouts of diarrhea within less than 30 mins; will inform oncoming nurse so that MD can be notified. and states that his blood pressure is low; pt asymptomatic (no dizziness or lightheadedness). Pt encourage to drink water throughout day.

## 2021-05-15 NOTE — Progress Notes (Signed)
Pt sitting in day room waiting for medications to be administered; calm, cooperative. 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 Pt endorsed SI with plan to cut wrists at this time. Pt states that he does not want to kill himself; he just has thoughts. Pt verbally contracts for safety. Pt currently denies HI/AVH. Pt reports that he does not sleep well and states "I sleep about an hour a night, even with my meds. I might look like I'm sleep but I'm not." Pt describes his appetite as "poor; real poor". Pt expresses feelings of anxiety, which he rates 10/10 and describes it as "out the roof". He also expresses feelings of depression which he rates 8/10. Pt states "the social worker at [facility] said that APS is gonna go to my house and put me in a home. I got too many problems." Pt states "It's cold in my room; my sinuses hurt. I'm very distraught right now." No observable behaviors noted. Patient's mood is incongruent with his affect. No acute distress noted.

## 2021-05-15 NOTE — Progress Notes (Signed)
Patient is alert oriented. Continue to endorse SI with plan to cut his wrist because he has "too many problem". "I'm worried about losing my apartment". Patient also stated " [facility] was going to send APS to my house, I don't want to go to a nursing home". Active listening and encouragement provided. Denies HI, and AVH. Report anxiety and depression 10/10. Complain of back and neck pain, medicated with PRN Tylenol. His meals in the day room among staff and peers. Compliant with medications. Q 15 minute checks continue. Patient remain safe on the unit.

## 2021-05-15 NOTE — Progress Notes (Signed)
Patient alert and oriented. Patient rates pain 10/10 stating that the pain is in his back, neck and spine. Patient rates anxiety and depression 10/10 stating its "out the roof". Patient currently endorses SI. When asked if patient has a plan patient stated "find something to cut my wrists". Patient verbally contracts for safety. Patient denies HI and AVH.   Patient compliant with medication administration.   During medication administration patient stated he remembered that he was brought to [facility] hospital without his belongings. Patient visibly worried and stated being worried about not having his belongings. Patient stated "they took everything", " sheriffs lost all my stuff, my wallet, keys, clothes, credit card" " I wont be able to get into my apartment". When the writer told the patient that they would check the locker the patient requested that the locker be checked now. Patient still stating worry about belongings requested that security be called and requesting to speak to the head of the hospital. Patient stating "Im going to have a stroke, this will be the end of me", " I need a doctor to come give me a shot to knock me out".  Writer reassured patient about belongings by showing and reading patient the belongings sheet telling patient that every item that he is worried about is accounted for and in his locker.   Q15 minute safety checks maintained. Patient remains safe on the unit at this time.

## 2021-05-16 MED ORDER — HYDROCORTISONE 1 % EX CREA
TOPICAL_CREAM | Freq: Three times a day (TID) | CUTANEOUS | Status: DC
  Administered 2021-05-17 – 2021-05-18 (×3): 1 via TOPICAL
  Filled 2021-05-16: qty 28

## 2021-05-16 NOTE — Plan of Care (Signed)
Patient presents A&Ox4.  Affect is anxious stating, "I'm just so worried I have so many problems."  Endorses SI stating, "if they send me home I'll cut my wrists."  Pt verbally contracts for safety.  Denies HI or AVH.  Rates anxiety and depression 10/10.  Offered Atarax, patient consented and given as ordered.   Compliant with all meds.  Will continue to monitor with Q 15 min safety rounds.  Problem: Education: Goal: Knowledge of [place] [facility] Education information/materials will improve Outcome: Progressing Goal: Emotional status will improve Outcome: Progressing Goal: Mental status will improve Outcome: Progressing Goal: Verbalization of understanding the information provided will improve Outcome: Progressing   Problem: Activity: Goal: Interest or engagement in activities will improve Outcome: Progressing Goal: Sleeping patterns will improve Outcome: Progressing   Problem: Coping: Goal: Ability to verbalize frustrations and anger appropriately will improve Outcome: Progressing Goal: Ability to demonstrate self-control will improve Outcome: Progressing   Problem: Health Behavior/Discharge Planning: Goal: Compliance with treatment plan for underlying cause of condition will improve Outcome: Progressing

## 2021-05-16 NOTE — BHH Counselor (Signed)
CSW contacted pt's therapist, Olegario Messier, [phone] regarding pt's follow up care. No answer. CSW left vm with call back information. CSW will follow up at more opportune time.   Caelynn Marshman [place], MSW, LCSW-A 12/12/202211:53 AM

## 2021-05-16 NOTE — Progress Notes (Signed)
Patient alert and oriented. Patient rates pain 8/10 in neck and spine. Patient stating that pain is from being told he had "ankylosing spondylitis". Patient rates anxiety and depression 10/10. Patient verbalizes SI with plan to "cut wrists". Patient currently denies HI and AVH.  Patient compliant with medication administration. After medication administration patient stated "I dont really want to kill myself, I just have a lot of problems", "I just have a lot of problems", "severe problems". Q15 minute safety checks maintained. Patient remains safe on the unit at this time.

## 2021-05-16 NOTE — Progress Notes (Signed)
[facility] MD Progress Note  05/16/2021 11:40 AM Anthony Lamb  MRN:  [id number] Subjective: Levan continues to state that if we discharge him Anthony Lamb is going to kill himself.  That seems to be his modus operandi.  Anthony Lamb has had numerous visits to the emergency room.  Anthony Lamb has been admitted before to [facility] behavioral health in the past for cutting himself.  Anthony Lamb has been living alone for the last 5 years in his mother's house after she passed away.  Anthony Lamb does have a Printmaker, Arts development officer.  Social work is trying to get a hold of her.  Anthony Lamb definitely needs a plan.  I think he do best in a group home setting and not know how feasible that is. Principal Problem: MDD (major depressive disorder), recurrent episode, with atypical features ([place]) Diagnosis: Principal Problem:   MDD (major depressive disorder), recurrent episode, with atypical features ([place]) Active Problems:   Essential hypertension   PUD (peptic ulcer disease)   Malnutrition of moderate degree ([place])   Alcohol abuse  Total Time spent with patient: 15 minutes  Past Psychiatric History:  Patient could not remember for certain having seen a psychiatrist in the past but it looks like Anthony Lamb had a previous hospitalization a couple years ago at [facility] with similar presentation.  It looks like the anxiety was more impressive than the depression was.  At that time however Anthony Lamb had also gotten into the hospital by threatening to cut his wrist.  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  There is no past history of a diagnosis of major depression or bipolar disorder or psychosis.  Past Medical History:  Past Medical History:  Diagnosis Date   Alcohol abuse, in remission    Anal fissure    Anemia    Ankylosing spondylitis (HCC)    Anxiety    Arthritis    Chronic  diarrhea    Chronic headaches    Chronic pain    Colitis    Colon polyps    Depression    Esophagitis    Gastric polyps    hyperplastic and fundic gland   Gastritis    GERD (gastroesophageal reflux disease)    Hypertension    IBS (irritable bowel syndrome)    Poor dentition    SIADH (syndrome of inappropriate ADH production) ([place])     Past Surgical History:  Procedure Laterality Date   BIOPSY  11/26/2018   Procedure: BIOPSY;  Surgeon: Gatha Mayer, MD;  Location: [facility];  Service: Endoscopy;;   COLONOSCOPY W/ BIOPSIES     COLONOSCOPY WITH PROPOFOL N/A 11/26/2018   Procedure: COLONOSCOPY WITH PROPOFOL;  Surgeon: Gatha Mayer, MD;  Location: [facility];  Service: Endoscopy;  Laterality: N/A;   ESOPHAGOGASTRODUODENOSCOPY     ESOPHAGOGASTRODUODENOSCOPY (EGD) WITH PROPOFOL N/A 11/26/2018   Procedure: ESOPHAGOGASTRODUODENOSCOPY (EGD) WITH PROPOFOL;  Surgeon: Gatha Mayer, MD;  Location: [facility];  Service: Endoscopy;  Laterality: N/A;   HEMOSTASIS CLIP PLACEMENT  11/26/2018   Procedure: HEMOSTASIS CLIP PLACEMENT;  Surgeon: Gatha Mayer, MD;  Location: [facility];  Service: Endoscopy;;   HERNIA REPAIR Bilateral    HOT HEMOSTASIS N/A 11/26/2018   Procedure: HOT HEMOSTASIS (ARGON PLASMA COAGULATION/BICAP);  Surgeon: Gatha Mayer, MD;  Location: [facility];  Service: Endoscopy;  Laterality: N/A;   OPEN REDUCTION INTERNAL FIXATION (  ORIF) DISTAL RADIAL FRACTURE Right 02/11/2018   Procedure: OPEN REDUCTION INTERNAL FIXATION (ORIF) DISTAL RADIAL FRACTURE;  Surgeon: Milly Jakob, MD;  Location: [place];  Service: Orthopedics;  Laterality: Right;   POLYPECTOMY  11/26/2018   Procedure: POLYPECTOMY;  Surgeon: Gatha Mayer, MD;  Location: [facility];  Service: Endoscopy;;   TONSILLECTOMY     Family History:  Family History  Problem Relation Age of Onset   Anxiety disorder Mother    Congestive Heart Failure Mother    Crohn's disease Mother    Colon cancer Mother 29    Arthritis Father    High blood pressure Father    Crohn's disease Father    Family Psychiatric  History: Unremarkable Social History:  Social History   Substance and Sexual Activity  Alcohol Use Not Currently   Comment: 2 25 oz of beer a night     Social History   Substance and Sexual Activity  Drug Use Yes   Types: Marijuana    Social History   Socioeconomic History   Marital status: Single    Spouse name: Not on file   Number of children: 0   Years of education: Not on file   Highest education level: Not on file  Occupational History   Not on file  Tobacco Use   Smoking status: Former    Types: Cigarettes    Quit date: 2014    Years since quitting: 8.9   Smokeless tobacco: Never  Vaping Use   Vaping Use: Never used  Substance and Sexual Activity   Alcohol use: Not Currently    Comment: 2 25 oz of beer a night   Drug use: Yes    Types: Marijuana   Sexual activity: Not Currently  Other Topics Concern   Not on file  Social History Narrative   HSG. Long - term disability - unable to work. Lived with his mother in her house - she died 13-May-2023 -    Lives in apartment   Has case worker   Social Determinants of Radio broadcast assistant Strain: Not on file  Food Insecurity: Not on file  Transportation Needs: Not on file  Physical Activity: Not on file  Stress: Not on file  Social Connections: Not on file   Additional Social History:                         Sleep: Good  Appetite:  Fair  Current Medications: Current Facility-Administered Medications  Medication Dose Route Frequency Provider Last Rate Last Admin   acetaminophen (TYLENOL) tablet 650 mg  650 mg Oral Q6H PRN Parks Ranger, DO   650 mg at 05/16/21 0715   alum & mag hydroxide-simeth (MAALOX/MYLANTA) 200-200-20 MG/5ML suspension 30 mL  30 mL Oral Q4H PRN Parks Ranger, DO   30 mL at 05/16/21 0909   feeding supplement (ENSURE ENLIVE / ENSURE PLUS) liquid 237 mL   237 mL Oral TID BM Clapacs, John T, MD   237 mL at 05/16/21 0907   FLUoxetine (PROZAC) capsule 20 mg  20 mg Oral Daily Parks Ranger, DO   20 mg at 05/16/21 6387   hydrOXYzine (ATARAX) tablet 25 mg  25 mg Oral TID PRN Parks Ranger, DO   25 mg at 05/16/21 0715   lisinopril (ZESTRIL) tablet 10 mg  10 mg Oral Daily Parks Ranger, DO   10 mg at 05/16/21 5643   loperamide (IMODIUM) capsule 2  mg  2 mg Oral QPC breakfast Parks Ranger, DO   2 mg at 05/16/21 4196   LORazepam (ATIVAN) tablet 1 mg  1 mg Oral TID Parks Ranger, DO   1 mg at 05/16/21 2229   magnesium hydroxide (MILK OF MAGNESIA) suspension 30 mL  30 mL Oral Daily PRN Parks Ranger, DO       multivitamin with minerals tablet 1 tablet  1 tablet Oral Daily Parks Ranger, DO   1 tablet at 05/16/21 7989   neomycin-bacitracin-polymyxin (NEOSPORIN) ointment   Topical BID Parks Ranger, DO   Given at 05/15/21 2119   ondansetron (ZOFRAN-ODT) disintegrating tablet 4 mg  4 mg Oral Q4H PRN Parks Ranger, DO   4 mg at 05/14/21 0910   pantoprazole (PROTONIX) EC tablet 40 mg  40 mg Oral Daily Clapacs, Madie Reno, MD   40 mg at 05/16/21 4174   propranolol (INDERAL) tablet 10 mg  10 mg Oral BID Clapacs, Madie Reno, MD   10 mg at 05/16/21 0814   QUEtiapine (SEROQUEL) tablet 300 mg  300 mg Oral QHS Clapacs, John T, MD   300 mg at 05/15/21 2133   risperiDONE (RISPERDAL) tablet 0.5 mg  0.5 mg Oral BID Parks Ranger, DO   0.5 mg at 05/16/21 0905   sucralfate (CARAFATE) 1 GM/10ML suspension 1 g  1 g Oral TID WC & HS Clapacs, John T, MD   1 g at 05/16/21 1134   traZODone (DESYREL) tablet 50 mg  50 mg Oral QHS Parks Ranger, DO   50 mg at 05/15/21 2135   verapamil (CALAN-SR) CR tablet 240 mg  240 mg Oral Daily Parks Ranger, DO   240 mg at 05/16/21 4818   zolpidem (AMBIEN) tablet 10 mg  10 mg Oral QHS Clapacs, Madie Reno, MD   10 mg at 05/15/21 2134    Lab  Results: No results found for this or any previous visit (from the past 48 hour(s)).  Blood Alcohol level:  Lab Results  Component Value Date   ETH <10 05/03/2021   ETH <10 56/31/4970    Metabolic Disorder Labs: Lab Results  Component Value Date   HGBA1C 5.4 05/07/2021   MPG 108 05/07/2021   MPG 105 11/12/2016   No results found for: PROLACTIN Lab Results  Component Value Date   CHOL 183 05/07/2021   TRIG 79 05/07/2021   HDL 69 05/07/2021   CHOLHDL 2.7 05/07/2021   VLDL 16 05/07/2021   LDLCALC 98 05/07/2021   LDLCALC 108 (H) 11/12/2016    Physical Findings: AIMS:  , ,  ,  ,    CIWA:    COWS:     Musculoskeletal: Strength & Muscle Tone: within normal limits Gait & Station: normal Patient leans: N/A  Psychiatric Specialty Exam:  Presentation  General Appearance: Casual  Eye Contact:Good  Speech:Clear and Coherent  Speech Volume:Normal  Handedness:Right   Mood and Affect  Mood:Euthymic  Affect:Congruent; Appropriate   Thought Process  Thought Processes:Coherent; Goal Directed  Descriptions of Associations:Intact  Orientation:Full (Time, Place and Person)  Thought Content:Logical  History of Schizophrenia/Schizoaffective disorder:No  Duration of Psychotic Symptoms:Less than six months  Hallucinations:No data recorded Ideas of Reference:None  Suicidal Thoughts:No data recorded Homicidal Thoughts:No data recorded  Sensorium  Memory:Immediate Good; Recent Good; Remote Good  Judgment:Good  Insight:Good   Executive Functions  Concentration:Good  Attention Span:Good  [place] of Knowledge:Good  Language:Good   Psychomotor Activity  Psychomotor  Activity:No data recorded  Assets  Assets:Communication Skills; Desire for Improvement; Financial Resources/Insurance; Housing; Leisure Time; Resilience   Sleep  Sleep:No data recorded   Physical Exam: Physical Exam Vitals and nursing note reviewed.  Constitutional:       Appearance: Normal appearance. Anthony Lamb is normal weight.  Neurological:     General: No focal deficit present.     Mental Status: Anthony Lamb is alert and oriented to person, place, and time.  Psychiatric:        Attention and Perception: Attention and perception normal.        Mood and Affect: Mood is anxious and depressed.        Speech: Speech normal.        Behavior: Behavior is withdrawn.        Thought Content: Thought content includes suicidal ideation.        Cognition and Memory: Cognition and memory normal.        Judgment: Judgment is impulsive and inappropriate.   ROS Blood pressure 122/72, pulse 85, temperature 97.9 F (36.6 C), temperature source Oral, resp. rate 18, height 5' 10"  (1.778 m), weight 65.8 kg, SpO2 99 %. Body mass index is 20.81 kg/m.   Treatment Plan Summary: Daily contact with patient to assess and evaluate symptoms and progress in treatment, Medication management, and Plan social work to get [employer] of Sharyn Lull his Printmaker and start working on discharge plans.  Parks Ranger, DO 05/16/2021, 11:40 AM

## 2021-05-16 NOTE — Progress Notes (Signed)
Recreation Therapy Notes   Date: 05/16/2021  Time: 1:15 pm   Location:  Craft room    Behavioral response: N/A   Intervention Topic: Coping skills     Discussion/Intervention: Patient did not attend group.  Clinical Observations/Feedback:  Patient did not attend group.   Jalesha Plotz LRT/CTRS        Cidney Kirkwood 05/16/2021 2:57 PM

## 2021-05-17 NOTE — Progress Notes (Signed)
[facility] MD Progress Note  05/17/2021 10:28 AM [facility]  MRN:  [id number] Subjective: [facility] Lamb continues to complain of problems with bodily functions.  The nurse did notice that he had a rash on his stomach and we gave him some cortisone.  He has been living on his own in an apartment in [place] since his mom passed away 5 years ago.  Seems to be functioning in the most part but he does go to the emergency room a lot.  He complains of being suicidal all the time.  He has superficially cut his wrist in the past but no serious suicide attempts and I believe that this is more of a personality disorder than a true suicide attempt.  I told him that he is going to be leaving by Friday and he seemed to be okay with that.  He is taking his medications as prescribed and denies any side effects.  I think the Risperdal helped with his anxiety and negative thinking.  We may end up going up on that before he leaves.  Principal Problem: MDD (major depressive disorder), recurrent episode, with atypical features ([place]) Diagnosis: Principal Problem:   MDD (major depressive disorder), recurrent episode, with atypical features ([place]) Active Problems:   Essential hypertension   PUD (peptic ulcer disease)   Malnutrition of moderate degree ([place])   Alcohol abuse  Total Time spent with patient: 15 minutes  Past Psychiatric History: Yes  Past Medical History:  Past Medical History:  Diagnosis Date   Alcohol abuse, in remission    Anal fissure    Anemia    Ankylosing spondylitis (HCC)    Anxiety    Arthritis    Chronic diarrhea    Chronic headaches    Chronic pain    Colitis    Colon polyps    Depression    Esophagitis    Gastric polyps    hyperplastic and fundic gland   Gastritis    GERD (gastroesophageal reflux disease)    Hypertension    IBS (irritable bowel syndrome)    Poor dentition    SIADH (syndrome of inappropriate ADH production) ([place])     Past Surgical History:  Procedure  Laterality Date   BIOPSY  11/26/2018   Procedure: BIOPSY;  Surgeon: Gatha Mayer, MD;  Location: [facility];  Service: Endoscopy;;   COLONOSCOPY W/ BIOPSIES     COLONOSCOPY WITH PROPOFOL N/A 11/26/2018   Procedure: COLONOSCOPY WITH PROPOFOL;  Surgeon: Gatha Mayer, MD;  Location: [facility];  Service: Endoscopy;  Laterality: N/A;   ESOPHAGOGASTRODUODENOSCOPY     ESOPHAGOGASTRODUODENOSCOPY (EGD) WITH PROPOFOL N/A 11/26/2018   Procedure: ESOPHAGOGASTRODUODENOSCOPY (EGD) WITH PROPOFOL;  Surgeon: Gatha Mayer, MD;  Location: [facility];  Service: Endoscopy;  Laterality: N/A;   HEMOSTASIS CLIP PLACEMENT  11/26/2018   Procedure: HEMOSTASIS CLIP PLACEMENT;  Surgeon: Gatha Mayer, MD;  Location: [facility];  Service: Endoscopy;;   HERNIA REPAIR Bilateral    HOT HEMOSTASIS N/A 11/26/2018   Procedure: HOT HEMOSTASIS (ARGON PLASMA COAGULATION/BICAP);  Surgeon: Gatha Mayer, MD;  Location: [facility];  Service: Endoscopy;  Laterality: N/A;   OPEN REDUCTION INTERNAL FIXATION (ORIF) DISTAL RADIAL FRACTURE Right 02/11/2018   Procedure: OPEN REDUCTION INTERNAL FIXATION (ORIF) DISTAL RADIAL FRACTURE;  Surgeon: Milly Jakob, MD;  Location: [facility];  Service: Orthopedics;  Laterality: Right;   POLYPECTOMY  11/26/2018   Procedure: POLYPECTOMY;  Surgeon: Gatha Mayer, MD;  Location: [facility];  Service: Endoscopy;;   TONSILLECTOMY  Family History:  Family History  Problem Relation Age of Onset   Anxiety disorder Mother    Congestive Heart Failure Mother    Crohn's disease Mother    Colon cancer Mother 43   Arthritis Father    High blood pressure Father    Crohn's disease Father    Family Psychiatric  History: Unremarkable Social History:  Social History   Substance and Sexual Activity  Alcohol Use Not Currently   Comment: 2 25 oz of beer a night     Social History   Substance and Sexual Activity  Drug Use Yes   Types: Marijuana    Social History   Socioeconomic  History   Marital status: Single    Spouse name: Not on file   Number of children: 0   Years of education: Not on file   Highest education level: Not on file  Occupational History   Not on file  Tobacco Use   Smoking status: Former    Types: Cigarettes    Quit date: 2014    Years since quitting: 8.9   Smokeless tobacco: Never  Vaping Use   Vaping Use: Never used  Substance and Sexual Activity   Alcohol use: Not Currently    Comment: 2 25 oz of beer a night   Drug use: Yes    Types: Marijuana   Sexual activity: Not Currently  Other Topics Concern   Not on file  Social History Narrative   HSG. Long - term disability - unable to work. Lived with his mother in her house - she died 05-03-23 -    Lives in apartment   Has case worker   Social Determinants of Radio broadcast assistant Strain: Not on file  Food Insecurity: Not on file  Transportation Needs: Not on file  Physical Activity: Not on file  Stress: Not on file  Social Connections: Not on file   Additional Social History:                         Sleep: Negative  Appetite:  Negative  Current Medications: Current Facility-Administered Medications  Medication Dose Route Frequency Provider Last Rate Last Admin   acetaminophen (TYLENOL) tablet 650 mg  650 mg Oral Q6H PRN Parks Ranger, DO   650 mg at 05/17/21 0736   alum & mag hydroxide-simeth (MAALOX/MYLANTA) 200-200-20 MG/5ML suspension 30 mL  30 mL Oral Q4H PRN Parks Ranger, DO   30 mL at 05/16/21 0909   feeding supplement (ENSURE ENLIVE / ENSURE PLUS) liquid 237 mL  237 mL Oral TID BM Clapacs, John T, MD   237 mL at 05/17/21 0933   FLUoxetine (PROZAC) capsule 20 mg  20 mg Oral Daily Parks Ranger, DO   20 mg at 05/17/21 8242   hydrocortisone cream 1 %   Topical TID Parks Ranger, DO   1 application at 35/36/14 0932   hydrOXYzine (ATARAX) tablet 25 mg  25 mg Oral TID PRN Parks Ranger, DO   25 mg at  05/17/21 0736   lisinopril (ZESTRIL) tablet 10 mg  10 mg Oral Daily Parks Ranger, DO   10 mg at 05/17/21 4315   loperamide (IMODIUM) capsule 2 mg  2 mg Oral QPC breakfast Parks Ranger, DO   2 mg at 05/17/21 4008   LORazepam (ATIVAN) tablet 1 mg  1 mg Oral TID Parks Ranger, DO   1 mg at 05/17/21  0930   magnesium hydroxide (MILK OF MAGNESIA) suspension 30 mL  30 mL Oral Daily PRN Parks Ranger, DO       multivitamin with minerals tablet 1 tablet  1 tablet Oral Daily Parks Ranger, DO   1 tablet at 05/17/21 6761   neomycin-bacitracin-polymyxin (NEOSPORIN) ointment   Topical BID Parks Ranger, DO   1 application at 95/09/32 6712   ondansetron (ZOFRAN-ODT) disintegrating tablet 4 mg  4 mg Oral Q4H PRN Parks Ranger, DO   4 mg at 05/16/21 1349   pantoprazole (PROTONIX) EC tablet 40 mg  40 mg Oral Daily Clapacs, Madie Reno, MD   40 mg at 05/17/21 0931   propranolol (INDERAL) tablet 10 mg  10 mg Oral BID Clapacs, John T, MD   10 mg at 05/17/21 0931   QUEtiapine (SEROQUEL) tablet 300 mg  300 mg Oral QHS Clapacs, John T, MD   300 mg at 05/16/21 2132   risperiDONE (RISPERDAL) tablet 0.5 mg  0.5 mg Oral BID Parks Ranger, DO   0.5 mg at 05/17/21 0930   sucralfate (CARAFATE) 1 GM/10ML suspension 1 g  1 g Oral TID WC & HS Clapacs, Madie Reno, MD   1 g at 05/17/21 0726   traZODone (DESYREL) tablet 50 mg  50 mg Oral QHS Parks Ranger, DO   50 mg at 05/16/21 2132   verapamil (CALAN-SR) CR tablet 240 mg  240 mg Oral Daily Parks Ranger, DO   240 mg at 05/17/21 0931   zolpidem (AMBIEN) tablet 10 mg  10 mg Oral QHS Clapacs, Madie Reno, MD   10 mg at 05/16/21 2129    Lab Results: No results found for this or any previous visit (from the past 48 hour(s)).  Blood Alcohol level:  Lab Results  Component Value Date   ETH <10 05/03/2021   ETH <10 45/80/9983    Metabolic Disorder Labs: Lab Results  Component Value Date    HGBA1C 5.4 05/07/2021   MPG 108 05/07/2021   MPG 105 11/12/2016   No results found for: PROLACTIN Lab Results  Component Value Date   CHOL 183 05/07/2021   TRIG 79 05/07/2021   HDL 69 05/07/2021   CHOLHDL 2.7 05/07/2021   VLDL 16 05/07/2021   LDLCALC 98 05/07/2021   LDLCALC 108 (H) 11/12/2016    Physical Findings: AIMS:  , ,  ,  ,    CIWA:    COWS:     Musculoskeletal: Strength & Muscle Tone: within normal limits Gait & Station: normal Patient leans: N/A  Psychiatric Specialty Exam:  Presentation  General Appearance: Casual  Eye Contact:Good  Speech:Clear and Coherent  Speech Volume:Normal  Handedness:Right   Mood and Affect  Mood:Euthymic  Affect:Congruent; Appropriate   Thought Process  Thought Processes:Coherent; Goal Directed  Descriptions of Associations:Intact  Orientation:Full (Time, Place and Person)  Thought Content:Logical  History of Schizophrenia/Schizoaffective disorder:No  Duration of Psychotic Symptoms:Less than six months  Hallucinations:No data recorded Ideas of Reference:None  Suicidal Thoughts:No data recorded Homicidal Thoughts:No data recorded  Sensorium  Memory:Immediate Good; Recent Good; Remote Good  Judgment:Good  Insight:Good   Executive Functions  Concentration:Good  Attention Span:Good  [place]  Language:Good   Psychomotor Activity  Psychomotor Activity:No data recorded  Assets  Assets:Communication Skills; Desire for Improvement; Financial Resources/Insurance; Housing; Leisure Time; Resilience   Sleep  Sleep:No data recorded   Physical Exam: Physical Exam Vitals and nursing note reviewed.  Constitutional:  Appearance: Normal appearance. He is normal weight.  Neurological:     General: No focal deficit present.     Mental Status: He is alert and oriented to person, place, and time.  Psychiatric:        Attention and Perception: Attention and perception  normal.        Mood and Affect: Mood is anxious and depressed. Affect is flat.        Speech: Speech normal.        Behavior: Behavior normal. Behavior is cooperative.        Thought Content: Thought content includes suicidal ideation.        Cognition and Memory: Cognition and memory normal.        Judgment: Judgment is impulsive.   Review of Systems  Constitutional: Negative.   HENT: Negative.    Eyes: Negative.   Respiratory: Negative.    Cardiovascular: Negative.   Gastrointestinal: Negative.   Genitourinary: Negative.   Musculoskeletal: Negative.   Skin: Negative.   Neurological: Negative.   Endo/Heme/Allergies: Negative.   Psychiatric/Behavioral:  Positive for depression.   Blood pressure 140/78, pulse 79, temperature 97.9 F (36.6 C), temperature source Oral, resp. rate 18, height 5' 10"  (1.778 m), weight 65.8 kg, SpO2 100 %. Body mass index is 20.81 kg/m.   Treatment Plan Summary: Daily contact with patient to assess and evaluate symptoms and progress in treatment, Medication management, and Plan continue current medications.  Consider increasing the Risperdal before discharge.  Social work is working on Dentist.  Parks Ranger, DO 05/17/2021, 10:28 AM

## 2021-05-17 NOTE — Plan of Care (Signed)
Patient presents A&Ox4.  Affect is anxious stating, "I'm just so worried I have so many problems."  Endorses SI stating, "if they send me home I'll cut my wrists."  Pt verbally contracts for safety.  Denies HI or AVH.  Rates anxiety and depression 10/10.  Offered Atarax, patient consented and given as ordered. Compliant with all meds.  Attended group therapy.  Will continue to monitor with Q 15 min safety rounds.  Problem: Education: Goal: Emotional status will improve Outcome: Progressing Goal: Mental status will improve Outcome: Progressing Goal: Verbalization of understanding the information provided will improve Outcome: Progressing   Problem: Coping: Goal: Ability to verbalize frustrations and anger appropriately will improve Outcome: Progressing   Problem: Health Behavior/Discharge Planning: Goal: Identification of resources available to assist in meeting health care needs will improve Outcome: Progressing

## 2021-05-17 NOTE — Progress Notes (Signed)
Recreation Therapy Notes  Date: 05/17/2021  Time: 1:15pm    Location: Craft room    Behavioral response: Appropriate  Intervention Topic: Goals   Discussion/Intervention:  Group content on today was focused on goals. Patients described what goals are and how they define goals. Individuals expressed how they go about setting goals and reaching them. The group identified how important goals are and if they make short term goals to reach long term goals. Patients described how many goals they work on at a time and what affects them not reaching their goal. Individuals described how much time they put into planning and obtaining their goals. The group participated in the intervention My Goal Board and made personal goal boards to help them achieve their goal. Clinical Observations/Feedback: Patient came to group and expressed that his goals is to get a solution for his problems. Individual was social with staff and peers while participating in the intervention. Participant left group early due to ear pains.  Brynden Thune LRT/CTRS          Donnamaria Shands 05/17/2021 2:30 PM

## 2021-05-17 NOTE — Group Note (Signed)
[facility] LCSW Group Therapy Note   Group Date: 05/17/2021 Start Time: 5501 End Time: 1415  Type of Therapy/Topic:  Group Therapy:  Feelings about Diagnosis  Participation Level:  Did Not Attend      Description of Group:    This group will allow patients to explore their thoughts and feelings about diagnoses they have received. Patients will be guided to explore their level of understanding and acceptance of these diagnoses. Facilitator will encourage patients to process their thoughts and feelings about the reactions of others to their diagnosis, and will guide patients in identifying ways to discuss their diagnosis with significant others in their lives. This group will be process-oriented, with patients participating in exploration of their own experiences as well as giving and receiving support and challenge from other group members.   Therapeutic Goals: 1. Patient will demonstrate understanding of diagnosis as evidence by identifying two or more symptoms of the disorder:  2. Patient will be able to express two feelings regarding the diagnosis 3. Patient will demonstrate ability to communicate their needs through discussion and/or role plays  Summary of Patient Progress:    X    Therapeutic Modalities:   Cognitive Behavioral Therapy Brief Therapy Feelings Identification    [place], LCSWA

## 2021-05-18 NOTE — Progress Notes (Signed)
Patient alert and oriented. Patient rates pain 9/10, PRN tylenol was given. Patient rates anxiety and depression 10/10. At the time of the assessment patient denies SI, HI, and AVH. Patient compliant with medication administration.  30 minutes after medication administration patient returns to the nurses station stating "you know how I said I had no thoughts of wanting to kill myself earlier, I lied. I do, not here but when I get out of here". Patient verbally contracted to safety. Q15 minute safety checks maintained. Patient remains safe on the unit at this time.

## 2021-05-18 NOTE — Progress Notes (Signed)
[facility] MD Progress Note  05/18/2021 11:09 AM Anthony Lamb  MRN:  [id number] Subjective: [name] continues to be very needy and has a lot of somatic complaints.  He currently denies any suicidal ideation.  He is taking his medications as prescribed and denies any side effects.  There is no evidence of EPS or TD.  Overall, I think he is improved.  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 Principal Problem: MDD (major depressive disorder), recurrent episode, with atypical features ([place]) Diagnosis: Principal Problem:   MDD (major depressive disorder), recurrent episode, with atypical features ([place]) Active Problems:   Essential hypertension   PUD (peptic ulcer disease)   Malnutrition of moderate degree ([place])   Alcohol abuse  Total Time spent with patient: 15 minutes  Past Psychiatric History: Patient could not remember for certain having seen a psychiatrist in the past but it looks like he had a previous hospitalization a couple years ago at [facility] with similar presentation.  It looks like the anxiety was more impressive than the depression was.  At that time however he had also gotten into the hospital by threatening to cut his wrist.  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  There is no past history of a diagnosis of major depression or bipolar disorder or psychosis  Past Medical History:  Past Medical History:  Diagnosis Date   Alcohol abuse, in remission    Anal fissure    Anemia    Ankylosing spondylitis (HCC)    Anxiety    Arthritis    Chronic diarrhea    Chronic headaches    Chronic pain    Colitis    Colon polyps    Depression    Esophagitis    Gastric polyps    hyperplastic and fundic gland   Gastritis    GERD  (gastroesophageal reflux disease)    Hypertension    IBS (irritable bowel syndrome)    Poor dentition    SIADH (syndrome of inappropriate ADH production) ([place])     Past Surgical History:  Procedure Laterality Date   BIOPSY  11/26/2018   Procedure: BIOPSY;  Surgeon: Gatha Mayer, MD;  Location: [facility];  Service: Endoscopy;;   COLONOSCOPY W/ BIOPSIES     COLONOSCOPY WITH PROPOFOL N/A 11/26/2018   Procedure: COLONOSCOPY WITH PROPOFOL;  Surgeon: Gatha Mayer, MD;  Location: [facility];  Service: Endoscopy;  Laterality: N/A;   ESOPHAGOGASTRODUODENOSCOPY     ESOPHAGOGASTRODUODENOSCOPY (EGD) WITH PROPOFOL N/A 11/26/2018   Procedure: ESOPHAGOGASTRODUODENOSCOPY (EGD) WITH PROPOFOL;  Surgeon: Gatha Mayer, MD;  Location: [facility];  Service: Endoscopy;  Laterality: N/A;   HEMOSTASIS CLIP PLACEMENT  11/26/2018   Procedure: HEMOSTASIS CLIP PLACEMENT;  Surgeon: Gatha Mayer, MD;  Location: [facility];  Service: Endoscopy;;   HERNIA REPAIR Bilateral    HOT HEMOSTASIS N/A 11/26/2018   Procedure: HOT HEMOSTASIS (ARGON PLASMA COAGULATION/BICAP);  Surgeon: Gatha Mayer, MD;  Location: [facility];  Service: Endoscopy;  Laterality: N/A;   OPEN REDUCTION INTERNAL FIXATION (ORIF) DISTAL RADIAL FRACTURE Right 02/11/2018   Procedure: OPEN REDUCTION INTERNAL FIXATION (ORIF) DISTAL RADIAL FRACTURE;  Surgeon: Milly Jakob, MD;  Location: [place];  Service: Orthopedics;  Laterality: Right;   POLYPECTOMY  11/26/2018   Procedure: POLYPECTOMY;  Surgeon: Gatha Mayer, MD;  Location: [facility];  Service: Endoscopy;;   TONSILLECTOMY     Family History:  Family History  Problem Relation Age of Onset   Anxiety disorder Mother    Congestive Heart Failure Mother    Crohn's disease Mother    Colon cancer Mother 53   Arthritis Father    High blood pressure Father    Crohn's disease Father     Social History:  Social History   Substance and Sexual Activity  Alcohol Use Not Currently    Comment: 2 25 oz of beer a night     Social History   Substance and Sexual Activity  Drug Use Yes   Types: Marijuana    Social History   Socioeconomic History   Marital status: Single    Spouse name: Not on file   Number of children: 0   Years of education: Not on file   Highest education level: Not on file  Occupational History   Not on file  Tobacco Use   Smoking status: Former    Types: Cigarettes    Quit date: 2014    Years since quitting: 8.9   Smokeless tobacco: Never  Vaping Use   Vaping Use: Never used  Substance and Sexual Activity   Alcohol use: Not Currently    Comment: 2 25 oz of beer a night   Drug use: Yes    Types: Marijuana   Sexual activity: Not Currently  Other Topics Concern   Not on file  Social History Narrative   HSG. Long - term disability - unable to work. Lived with his mother in her house - she died May 19, 2023 -    Lives in apartment   Has case worker   Social Determinants of Radio broadcast assistant Strain: Not on file  Food Insecurity: Not on file  Transportation Needs: Not on file  Physical Activity: Not on file  Stress: Not on file  Social Connections: Not on file                          Sleep: Good  Appetite:  Good  Current Medications: Current Facility-Administered Medications  Medication Dose Route Frequency Provider Last Rate Last Admin   acetaminophen (TYLENOL) tablet 650 mg  650 mg Oral Q6H PRN Parks Ranger, DO   650 mg at 05/18/21 0549   alum & mag hydroxide-simeth (MAALOX/MYLANTA) 200-200-20 MG/5ML suspension 30 mL  30 mL Oral Q4H PRN Parks Ranger, DO   30 mL at 05/16/21 0909   feeding supplement (ENSURE ENLIVE / ENSURE PLUS) liquid 237 mL  237 mL Oral TID BM Clapacs, John T, MD   237 mL at 05/18/21 0905   FLUoxetine (PROZAC) capsule 20 mg  20 mg Oral Daily Parks Ranger, DO   20 mg at 05/18/21 0901   hydrocortisone cream 1 %   Topical TID Parks Ranger, DO   1  application at 01/17/47 1856   hydrOXYzine (ATARAX) tablet 25 mg  25 mg Oral TID PRN Parks Ranger, DO   25 mg at 05/17/21 2115   lisinopril (ZESTRIL) tablet 10 mg  10 mg Oral Daily Parks Ranger, DO   10 mg at 05/18/21 3149   loperamide (IMODIUM) capsule 2 mg  2 mg Oral QPC breakfast Parks Ranger, DO   2 mg at 05/18/21 0902   LORazepam (ATIVAN) tablet 1 mg  1 mg Oral  TID Parks Ranger, DO   1 mg at 05/18/21 0902   magnesium hydroxide (MILK OF MAGNESIA) suspension 30 mL  30 mL Oral Daily PRN Parks Ranger, DO       multivitamin with minerals tablet 1 tablet  1 tablet Oral Daily Parks Ranger, DO   1 tablet at 05/18/21 0901   neomycin-bacitracin-polymyxin (NEOSPORIN) ointment   Topical BID Parks Ranger, DO   1 application at 26/71/24 5809   ondansetron (ZOFRAN-ODT) disintegrating tablet 4 mg  4 mg Oral Q4H PRN Parks Ranger, DO   4 mg at 05/18/21 9833   pantoprazole (PROTONIX) EC tablet 40 mg  40 mg Oral Daily Clapacs, Madie Reno, MD   40 mg at 05/18/21 0902   propranolol (INDERAL) tablet 10 mg  10 mg Oral BID Clapacs, John T, MD   10 mg at 05/18/21 0902   QUEtiapine (SEROQUEL) tablet 300 mg  300 mg Oral QHS Clapacs, John T, MD   300 mg at 05/17/21 2113   risperiDONE (RISPERDAL) tablet 0.5 mg  0.5 mg Oral BID Parks Ranger, DO   0.5 mg at 05/18/21 0901   sucralfate (CARAFATE) 1 GM/10ML suspension 1 g  1 g Oral TID WC & HS Clapacs, Madie Reno, MD   1 g at 05/18/21 0733   traZODone (DESYREL) tablet 50 mg  50 mg Oral QHS Parks Ranger, DO   50 mg at 05/17/21 2115   verapamil (CALAN-SR) CR tablet 240 mg  240 mg Oral Daily Parks Ranger, DO   240 mg at 05/18/21 0902   zolpidem (AMBIEN) tablet 10 mg  10 mg Oral QHS Clapacs, Madie Reno, MD   10 mg at 05/17/21 2114    Lab Results: No results found for this or any previous visit (from the past 48 hour(s)).  Blood Alcohol level:  Lab Results  Component  Value Date   ETH <10 05/03/2021   ETH <10 82/50/5397    Metabolic Disorder Labs: Lab Results  Component Value Date   HGBA1C 5.4 05/07/2021   MPG 108 05/07/2021   MPG 105 11/12/2016   No results found for: PROLACTIN Lab Results  Component Value Date   CHOL 183 05/07/2021   TRIG 79 05/07/2021   HDL 69 05/07/2021   CHOLHDL 2.7 05/07/2021   VLDL 16 05/07/2021   LDLCALC 98 05/07/2021   LDLCALC 108 (H) 11/12/2016    Physical Findings: AIMS:  , ,  ,  ,    CIWA:    COWS:     Musculoskeletal: Strength & Muscle Tone: within normal limits Gait & Station: normal Patient leans: N/A  Psychiatric Specialty Exam:  Presentation  General Appearance: Casual  Eye Contact:Good  Speech:Clear and Coherent  Speech Volume:Normal  Handedness:Right   Mood and Affect  Mood:Euthymic  Affect:Congruent; Appropriate   Thought Process  Thought Processes:Coherent; Goal Directed  Descriptions of Associations:Intact  Orientation:Full (Time, Place and Person)  Thought Content:Logical  History of Schizophrenia/Schizoaffective disorder:No  Duration of Psychotic Symptoms:Less than six months  Hallucinations:No data recorded Ideas of Reference:None  Suicidal Thoughts:No data recorded Homicidal Thoughts:No data recorded  Sensorium  Memory:Immediate Good; Recent Good; Remote Good  Judgment:Good  Insight:Good   Executive Functions  Concentration:Good  Attention Span:Good  [place]  Language:Good   Psychomotor Activity  Psychomotor Activity:No data recorded  Assets  Assets:Communication Skills; Desire for Improvement; Financial Resources/Insurance; Housing; Leisure Time; Resilience   Sleep  Sleep:No data recorded   Physical Exam:  Physical Exam Vitals and nursing note reviewed.  Constitutional:      Appearance: Normal appearance. He is normal weight.  Neurological:     General: No focal deficit present.     Mental Status: He  is alert and oriented to person, place, and time.  Psychiatric:        Attention and Perception: Attention and perception normal.        Mood and Affect: Mood is anxious and depressed. Affect is flat.        Speech: Speech normal.        Behavior: Behavior is withdrawn. Behavior is cooperative.        Thought Content: Thought content normal.        Cognition and Memory: Cognition and memory normal.        Judgment: Judgment normal.   Review of Systems  Constitutional: Negative.   HENT: Negative.    Eyes: Negative.   Respiratory: Negative.    Cardiovascular: Negative.   Gastrointestinal: Negative.   Genitourinary: Negative.   Musculoskeletal: Negative.   Skin: Negative.   Neurological: Negative.   Endo/Heme/Allergies: Negative.   Psychiatric/Behavioral:  Positive for depression. The patient is nervous/anxious.   Blood pressure (!) 143/83, pulse 88, temperature 97.7 F (36.5 C), temperature source Oral, resp. rate 20, height 5' 10"  (1.778 m), weight 65.8 kg, SpO2 98 %. Body mass index is 20.81 kg/m.   Treatment Plan Summary: Daily contact with patient to assess and evaluate symptoms and progress in treatment, Medication management, and Plan continue current medications.  Discharge tomorrow or Friday.  [place], DO 05/18/2021, 11:09 AM

## 2021-05-18 NOTE — Progress Notes (Signed)
Patient appears as anxious. Pt rated anxiety and depression 10/10. Pt stated that he's "got a whole lotta problems.' Pt stated he feels overwhelmed about his living arrangements. Pt reports having SI with plan to cut wrists but verbally contracts to safety. Pt denies HI and AVH. PRN Atarax administered. Will continue to monitor with q 15 minute safety checks.

## 2021-05-18 NOTE — Plan of Care (Signed)
°  Problem: Group Participation Goal: STG - Patient will engage in groups without prompting or encouragement from LRT x3 group sessions within 5 recreation therapy group sessions Description: STG - Patient will engage in groups without prompting or encouragement from LRT x3 group sessions within 5 recreation therapy group sessions Outcome: Progressing

## 2021-05-18 NOTE — [facility] Counselor (Signed)
CSW contacted pt's care team at [facility], [phone]. No answer. CSW left voicemail regarding pt's discharge plan, with contact information to call back CSW.   CSW will follow up regarding pt's discharge tomorrow.   Landry Lookingbill [place], MSW, LCSW-A 12/14/20223:57 PM

## 2021-05-18 NOTE — Progress Notes (Signed)
Recreation Therapy Notes   Date: 05/18/2021  Time: 1:15pm    Location: [facility] [place]     Behavioral response: Appropriate  Intervention Topic: Social-Skills    Discussion/Intervention:  Group content on today was focused on social skills. The group defined social skills and identified ways they use social skills. Patients expressed what obstacles they face when trying to be social. Participants described the importance of social skills. The group listed ways to improve social skills and reasons to improve social skills. Individuals had an opportunity to learn new and improve social skills as well as identify their weaknesses. Clinical Observations/Feedback: Patient came to group and stated he was passing out while talking to Probation officer. Participant explained that he needed an IV drip and began to complain of all things related to his health. Individual left group to talk to his nurse.  Jrake Rodriquez LRT/CTRS         Janellie Tennison 05/18/2021 3:26 PM

## 2021-05-18 NOTE — Progress Notes (Signed)
Patient comes to the nursing station multiple times this morning. He states that he has too many problems and needs to stay at the hospital to work on his living arrangements. He reports SI with plan to cut his wrists. Patient rates anxiety and depression as a 10/10. Patient interacts minimally with other patients on the unit. Patient remains safe on the unit at this time.

## 2021-05-19 MED ORDER — SALINE SPRAY 0.65 % NA SOLN
1.0000 | NASAL | Status: DC | PRN
  Administered 2021-05-19: 1 via NASAL
  Filled 2021-05-19 (×2): qty 44

## 2021-05-19 NOTE — Plan of Care (Signed)
Patient presents A&Ox4.  Affect is anxious stating, "I can't go home today, I got too many problems."  Endorses SI with plan to cut his wrists.  Pt verbally contracts for safety.  Denies HI or AVH.  Rates anxiety and depression 10/10.  Atarax given prn as well as scheduled Ativan.  Tyelnol given prn for neck and back pain with relief reported.  Compliant with all meds.  Will continue to monitor with Q 15 min safety rounds.  Problem: Education: Goal: Knowledge of [facility] Education information/materials will improve Outcome: Progressing Goal: Emotional status will improve Outcome: Progressing Goal: Mental status will improve Outcome: Progressing Goal: Verbalization of understanding the information provided will improve Outcome: Progressing   Problem: Activity: Goal: Sleeping patterns will improve Outcome: Progressing   Problem: Health Behavior/Discharge Planning: Goal: Identification of resources available to assist in meeting health care needs will improve Outcome: Progressing Goal: Compliance with treatment plan for underlying cause of condition will improve Outcome: Progressing   Problem: Safety: Goal: Periods of time without injury will increase Outcome: Progressing

## 2021-05-19 NOTE — Group Note (Signed)
[facility] LCSW Group Therapy Note   Group Date: 05/19/2021 Start Time: 5809 End Time: 1400   Type of Therapy/Topic:  Group Therapy:  Balance in Life  Participation Level:  Did Not Attend   Description of Group:    This group will address the concept of balance and how it feels and looks when one is unbalanced. Patients will be encouraged to process areas in their lives that are out of balance, and identify reasons for remaining unbalanced. Facilitators will guide patients utilizing problem- solving interventions to address and correct the stressor making their life unbalanced. Understanding and applying boundaries will be explored and addressed for obtaining  and maintaining a balanced life. Patients will be encouraged to explore ways to assertively make their unbalanced needs known to significant others in their lives, using other group members and facilitator for support and feedback.  Therapeutic Goals: Patient will identify two or more emotions or situations they have that consume much of in their lives. Patient will identify signs/triggers that life has become out of balance:  Patient will identify two ways to set boundaries in order to achieve balance in their lives:  Patient will demonstrate ability to communicate their needs through discussion and/or role plays  Summary of Patient Progress:    X    Therapeutic Modalities:   Cognitive Behavioral Therapy Solution-Focused Therapy Assertiveness Training   [place], LCSWA

## 2021-05-19 NOTE — Progress Notes (Signed)
Recreation Therapy Notes   Date: 05/19/2021  Time: 1:00 pm     Location: Craft room    Behavioral response: N/A   Intervention Topic: Leisure   Discussion/Intervention: Patient did not attend group.   Clinical Observations/Feedback:  Patient did not attend group.    Atlantis Delong LRT/CTRS          Ashwini Jago 05/19/2021 3:11 PM

## 2021-05-19 NOTE — BH IP Treatment Plan (Signed)
Interdisciplinary Treatment and Diagnostic Plan Update  05/19/2021 Time of Session: 9:30AM Anthony Lamb MRN: [id number]  Principal Diagnosis: MDD (major depressive disorder), recurrent episode, with atypical features ([place])  Secondary Diagnoses: Principal Problem:   MDD (major depressive disorder), recurrent episode, with atypical features ([place]) Active Problems:   Essential hypertension   PUD (peptic ulcer disease)   Malnutrition of moderate degree ([facility] [place] [facility])   Alcohol abuse   Current Medications:  Current Facility-Administered Medications  Medication Dose Route Frequency Provider Last Rate Last Admin   acetaminophen (TYLENOL) tablet 650 mg  650 mg Oral Q6H PRN Parks Ranger, DO   650 mg at 05/19/21 1331   alum & mag hydroxide-simeth (MAALOX/MYLANTA) 200-200-20 MG/5ML suspension 30 mL  30 mL Oral Q4H PRN Parks Ranger, DO   30 mL at 05/16/21 0909   feeding supplement (ENSURE ENLIVE / ENSURE PLUS) liquid 237 mL  237 mL Oral TID BM Clapacs, John T, MD   237 mL at 05/19/21 1345   FLUoxetine (PROZAC) capsule 20 mg  20 mg Oral Daily Parks Ranger, DO   20 mg at 05/19/21 0930   hydrocortisone cream 1 %   Topical TID Parks Ranger, DO   Given at 05/19/21 1526   hydrOXYzine (ATARAX) tablet 25 mg  25 mg Oral TID PRN Parks Ranger, DO   25 mg at 05/19/21 0645   lisinopril (ZESTRIL) tablet 10 mg  10 mg Oral Daily Parks Ranger, DO   10 mg at 05/19/21 0930   loperamide (IMODIUM) capsule 2 mg  2 mg Oral QPC breakfast Parks Ranger, DO   2 mg at 05/19/21 0940   LORazepam (ATIVAN) tablet 1 mg  1 mg Oral TID Parks Ranger, DO   1 mg at 05/19/21 1526   magnesium hydroxide (MILK OF MAGNESIA) suspension 30 mL  30 mL Oral Daily PRN Parks Ranger, DO       multivitamin with minerals tablet 1 tablet  1 tablet Oral Daily Parks Ranger, DO   1 tablet at 05/19/21 0930   neomycin-bacitracin-polymyxin (NEOSPORIN)  ointment   Topical BID Parks Ranger, DO   Given at 05/19/21 0940   ondansetron (ZOFRAN-ODT) disintegrating tablet 4 mg  4 mg Oral Q4H PRN Parks Ranger, DO   4 mg at 05/19/21 0940   pantoprazole (PROTONIX) EC tablet 40 mg  40 mg Oral Daily Clapacs, John T, MD   40 mg at 05/19/21 0930   propranolol (INDERAL) tablet 10 mg  10 mg Oral BID Clapacs, John T, MD   10 mg at 05/19/21 0931   QUEtiapine (SEROQUEL) tablet 300 mg  300 mg Oral QHS Clapacs, John T, MD   300 mg at 05/18/21 2133   risperiDONE (RISPERDAL) tablet 0.5 mg  0.5 mg Oral BID Parks Ranger, DO   0.5 mg at 05/19/21 0930   sodium chloride (OCEAN) 0.65 % nasal spray 1 spray  1 spray Each Nare PRN Parks Ranger, DO   1 spray at 05/19/21 1556   sucralfate (CARAFATE) 1 GM/10ML suspension 1 g  1 g Oral TID WC & HS Clapacs, John T, MD   1 g at 05/19/21 1600   traZODone (DESYREL) tablet 50 mg  50 mg Oral QHS Parks Ranger, DO   50 mg at 05/18/21 2135   verapamil (CALAN-SR) CR tablet 240 mg  240 mg Oral Daily Parks Ranger, DO   240 mg at 05/19/21 0931   zolpidem (AMBIEN)  tablet 10 mg  10 mg Oral QHS Clapacs, John T, MD   10 mg at 05/18/21 2134   PTA Medications: Medications Prior to Admission  Medication Sig Dispense Refill Last Dose   acetaminophen (TYLENOL) 500 MG tablet Take 500-1,000 mg by mouth every 6 (six) hours as needed (for pain).      chlordiazePOXIDE (LIBRIUM) 25 MG capsule 20m PO TID x 1D, then 25-535mPO BID X 1D, then 25-508mO QD X 1D (Patient not taking: Reported on 05/03/2021) 10 capsule 0    diclofenac Sodium (VOLTAREN) 1 % GEL Apply 2 g topically 4 (four) times daily as needed (to painful sites). (Patient not taking: Reported on 05/03/2021)      famotidine (PEPCID) 20 MG tablet Take 1 tablet (20 mg total) by mouth 2 (two) times daily. (Patient not taking: Reported on 04/26/2021) 30 tablet 0    lisinopril (PRINIVIL,ZESTRIL) 10 MG tablet Take 1 tablet (10 mg total) by  mouth daily. 30 tablet 0    LORazepam (ATIVAN) 1 MG tablet Take 1 mg by mouth 3 (three) times daily.      Multiple Vitamin (MULTIVITAMIN WITH MINERALS) TABS tablet Take 1 tablet by mouth daily. (May buy over the counter) (Patient taking differently: Take 1 tablet by mouth daily.) 1 tablet 0    ondansetron (ZOFRAN ODT) 4 MG disintegrating tablet Take 1 tablet (4 mg total) by mouth every 4 (four) hours as needed for nausea or vomiting. (Patient not taking: Reported on 05/03/2021) 20 tablet 0    ondansetron (ZOFRAN-ODT) 4 MG disintegrating tablet Take 1 tablet (4 mg total) by mouth every 4 (four) hours as needed for nausea or vomiting. (Patient not taking: Reported on 05/03/2021) 20 tablet 0    pantoprazole (PROTONIX) 40 MG tablet Take 1 tablet (40 mg total) by mouth daily. (Patient taking differently: Take 40 mg by mouth in the morning.) 30 tablet 11    propranolol (INDERAL) 10 MG tablet Take 10 mg by mouth 2 (two) times daily.      QUEtiapine (SEROQUEL) 300 MG tablet Take 300 mg by mouth at bedtime.      sucralfate (CARAFATE) 1 g tablet Take 1 g by mouth 4 (four) times daily.      sucralfate (CARAFATE) 1 GM/10ML suspension Take 10 mLs (1 g total) by mouth 4 (four) times daily -  with meals and at bedtime. (Patient not taking: Reported on 05/03/2021) 420 mL 0    tiZANidine (ZANAFLEX) 4 MG tablet Take 4 mg by mouth 3 (three) times daily as needed for muscle spasms.      verapamil (CALAN-SR) 240 MG CR tablet Take 240 mg by mouth daily.  2    zolpidem (AMBIEN) 5 MG tablet Take 5 mg by mouth at bedtime.       Patient Stressors:    Patient Strengths:    Treatment Modalities: Medication Management, Group therapy, Case management,  1 to 1 session with clinician, Psychoeducation, Recreational therapy.   Physician Treatment Plan for Primary Diagnosis: MDD (major depressive disorder), recurrent episode, with atypical features ([place] Term Goal(s): Improvement in symptoms so as ready for discharge    Short Term Goals: Ability to maintain clinical measurements within normal limits will improve Compliance with prescribed medications will improve Ability to verbalize feelings will improve Ability to disclose and discuss suicidal ideas Ability to demonstrate self-control will improve  Medication Management: Evaluate patient's response, side effects, and tolerance of medication regimen.  Therapeutic Interventions: 1 to 1 sessions, Unit Group sessions  and Medication administration.  Evaluation of Outcomes: Adequate for Discharge  Physician Treatment Plan for Secondary Diagnosis: Principal Problem:   MDD (major depressive disorder), recurrent episode, with atypical features ([place]) Active Problems:   Essential hypertension   PUD (peptic ulcer disease)   Malnutrition of moderate degree (HCC)   Alcohol abuse  Long Term Goal(s): Improvement in symptoms so as ready for discharge   Short Term Goals: Ability to maintain clinical measurements within normal limits will improve Compliance with prescribed medications will improve Ability to verbalize feelings will improve Ability to disclose and discuss suicidal ideas Ability to demonstrate self-control will improve     Medication Management: Evaluate patient's response, side effects, and tolerance of medication regimen.  Therapeutic Interventions: 1 to 1 sessions, Unit Group sessions and Medication administration.  Evaluation of Outcomes: Adequate for Discharge   RN Treatment Plan for Primary Diagnosis: MDD (major depressive disorder), recurrent episode, with atypical features ([place]) Long Term Goal(s): Knowledge of disease and therapeutic regimen to maintain health will improve  Short Term Goals: Ability to remain free from injury will improve, Ability to verbalize frustration and anger appropriately will improve, Ability to demonstrate self-control, Ability to participate in decision making will improve, Ability to verbalize feelings will  improve, Ability to disclose and discuss suicidal ideas, Ability to identify and develop effective coping behaviors will improve, and Compliance with prescribed medications will improve  Medication Management: RN will administer medications as ordered by provider, will assess and evaluate patient's response and provide education to patient for prescribed medication. RN will report any adverse and/or side effects to prescribing provider.  Therapeutic Interventions: 1 on 1 counseling sessions, Psychoeducation, Medication administration, Evaluate responses to treatment, Monitor vital signs and CBGs as ordered, Perform/monitor CIWA, COWS, AIMS and Fall Risk screenings as ordered, Perform wound care treatments as ordered.  Evaluation of Outcomes: Adequate for Discharge   LCSW Treatment Plan for Primary Diagnosis: MDD (major depressive disorder), recurrent episode, with atypical features ([place]) Long Term Goal(s): Safe transition to appropriate next level of care at discharge, Engage patient in therapeutic group addressing interpersonal concerns.  Short Term Goals: Engage patient in aftercare planning with referrals and resources, Increase social support, Increase ability to appropriately verbalize feelings, Increase emotional regulation, Facilitate acceptance of mental health diagnosis and concerns, Identify triggers associated with mental health/substance abuse issues, and Increase skills for wellness and recovery  Therapeutic Interventions: Assess for all discharge needs, 1 to 1 time with Social worker, Explore available resources and support systems, Assess for adequacy in community support network, Educate family and significant other(s) on suicide prevention, Complete Psychosocial Assessment, Interpersonal group therapy.  Evaluation of Outcomes: Adequate for Discharge   Progress in Treatment: Attending groups: No. Participating in groups: No. Taking medication as prescribed: Yes. Toleration  medication: Yes. Family/Significant other contact made: Yes, individual(s) contacted:  pt's care team lead provider Patient understands diagnosis: No. Discussing patient identified problems/goals with staff: Yes. Medical problems stabilized or resolved: Yes. Denies suicidal/homicidal ideation: No. Issues/concerns per patient self-inventory: No. Other: None  New problem(s) identified: No, Describe:  None  New Short Term/Long Term Goal(s): medication management for mood stabilization; elimination of SI thoughts; development of comprehensive mental wellness plan. Update 05/14/2021: No changes at this time. Update 05/19/21: no changes at this time.    Patient Goals:  "To get better...to get everything straightened out." Update 05/14/2021: No changes at this time. Update 05/19/21: no changes at this time.    Discharge Plan or Barriers: CSW will assist pt with development  of appropriate discharge/aftercare plan. Update 05/14/2021: No changes at this time. Update 05/19/21: no changes at this time.    Reason for Continuation of Hospitalization: Anxiety Depression Medication stabilization Suicidal ideation   Estimated Length of Stay: TBD  Scribe for Treatment Team: Mari Battaglia A [place], Latanya Presser 05/19/2021 4:06 PM

## 2021-05-19 NOTE — Progress Notes (Signed)
Patient alert and oriented. Patient rated pain 8/10, PRN tylenol was given. Patient later rated pain 7/10 when pain was reassessed. Patient rates anxiety and depression 10/10. Patient expresses worries about discharge. Patient denies SI,HI, and AVH. Patient compliant with medication administration.  Q15 minute safety checks maintained. Patient remains safe on the unit at this time.

## 2021-05-19 NOTE — Progress Notes (Signed)
[facility] MD Progress Note  05/19/2021 11:52 AM Anthony Lamb  MRN:  [id number] Subjective: Kieth Brightly continues to be withdrawn and depressed.  He complains of a lot of anxiety and has a lot of somatic complaints.  He denies any suicidal ideation.  He does not want to go home because he is being taken care of here.  He does live alone in an apartment in [place].  He lived with his mom up until 5 years ago when she passed away.  He is taking his medications as prescribed and denies any side effects.  Social work is working on getting him transportation and follow-up through his outpatient provider and support.  Principal Problem: MDD (major depressive disorder), recurrent episode, with atypical features ([place]) Diagnosis: Principal Problem:   MDD (major depressive disorder), recurrent episode, with atypical features ([place]) Active Problems:   Essential hypertension   PUD (peptic ulcer disease)   Malnutrition of moderate degree ([place])   Alcohol abuse  Total Time spent with patient: 15 minutes  Past Psychiatric History: Yes  Past Medical History:  Past Medical History:  Diagnosis Date   Alcohol abuse, in remission    Anal fissure    Anemia    Ankylosing spondylitis (HCC)    Anxiety    Arthritis    Chronic diarrhea    Chronic headaches    Chronic pain    Colitis    Colon polyps    Depression    Esophagitis    Gastric polyps    hyperplastic and fundic gland   Gastritis    GERD (gastroesophageal reflux disease)    Hypertension    IBS (irritable bowel syndrome)    Poor dentition    SIADH (syndrome of inappropriate ADH production) ([place])     Past Surgical History:  Procedure Laterality Date   BIOPSY  11/26/2018   Procedure: BIOPSY;  Surgeon: Gatha Mayer, MD;  Location: [facility];  Service: Endoscopy;;   COLONOSCOPY W/ BIOPSIES     COLONOSCOPY WITH PROPOFOL N/A 11/26/2018   Procedure: COLONOSCOPY WITH PROPOFOL;  Surgeon: Gatha Mayer, MD;  Location: [facility];  Service:  Endoscopy;  Laterality: N/A;   ESOPHAGOGASTRODUODENOSCOPY     ESOPHAGOGASTRODUODENOSCOPY (EGD) WITH PROPOFOL N/A 11/26/2018   Procedure: ESOPHAGOGASTRODUODENOSCOPY (EGD) WITH PROPOFOL;  Surgeon: Gatha Mayer, MD;  Location: [facility];  Service: Endoscopy;  Laterality: N/A;   HEMOSTASIS CLIP PLACEMENT  11/26/2018   Procedure: HEMOSTASIS CLIP PLACEMENT;  Surgeon: Gatha Mayer, MD;  Location: [facility];  Service: Endoscopy;;   HERNIA REPAIR Bilateral    HOT HEMOSTASIS N/A 11/26/2018   Procedure: HOT HEMOSTASIS (ARGON PLASMA COAGULATION/BICAP);  Surgeon: Gatha Mayer, MD;  Location: [facility];  Service: Endoscopy;  Laterality: N/A;   OPEN REDUCTION INTERNAL FIXATION (ORIF) DISTAL RADIAL FRACTURE Right 02/11/2018   Procedure: OPEN REDUCTION INTERNAL FIXATION (ORIF) DISTAL RADIAL FRACTURE;  Surgeon: Milly Jakob, MD;  Location: [facility];  Service: Orthopedics;  Laterality: Right;   POLYPECTOMY  11/26/2018   Procedure: POLYPECTOMY;  Surgeon: Gatha Mayer, MD;  Location: [facility];  Service: Endoscopy;;   TONSILLECTOMY     Family History:  Family History  Problem Relation Age of Onset   Anxiety disorder Mother    Congestive Heart Failure Mother    Crohn's disease Mother    Colon cancer Mother 33   Arthritis Father    High blood pressure Father    Crohn's disease Father     Social History:  Social History   Substance and  Sexual Activity  Alcohol Use Not Currently   Comment: 2 25 oz of beer a night     Social History   Substance and Sexual Activity  Drug Use Yes   Types: Marijuana    Social History   Socioeconomic History   Marital status: Single    Spouse name: Not on file   Number of children: 0   Years of education: Not on file   Highest education level: Not on file  Occupational History   Not on file  Tobacco Use   Smoking status: Former    Types: Cigarettes    Quit date: 2014    Years since quitting: 8.9   Smokeless tobacco: Never  Vaping Use    Vaping Use: Never used  Substance and Sexual Activity   Alcohol use: Not Currently    Comment: 2 25 oz of beer a night   Drug use: Yes    Types: Marijuana   Sexual activity: Not Currently  Other Topics Concern   Not on file  Social History Narrative   HSG. Long - term disability - unable to work. Lived with his mother in her house - she died 2023-05-18 -    Lives in apartment   Has case worker   Social Determinants of Radio broadcast assistant Strain: Not on file  Food Insecurity: Not on file  Transportation Needs: Not on file  Physical Activity: Not on file  Stress: Not on file  Social Connections: Not on file   Additional Social History:                         Sleep: Good  Appetite:  Good  Current Medications: Current Facility-Administered Medications  Medication Dose Route Frequency Provider Last Rate Last Admin   acetaminophen (TYLENOL) tablet 650 mg  650 mg Oral Q6H PRN Parks Ranger, DO   650 mg at 05/19/21 0554   alum & mag hydroxide-simeth (MAALOX/MYLANTA) 200-200-20 MG/5ML suspension 30 mL  30 mL Oral Q4H PRN Parks Ranger, DO   30 mL at 05/16/21 0909   feeding supplement (ENSURE ENLIVE / ENSURE PLUS) liquid 237 mL  237 mL Oral TID BM Clapacs, John T, MD   237 mL at 05/19/21 0932   FLUoxetine (PROZAC) capsule 20 mg  20 mg Oral Daily Parks Ranger, DO   20 mg at 05/19/21 0930   hydrocortisone cream 1 %   Topical TID Parks Ranger, DO   Given at 05/19/21 0940   hydrOXYzine (ATARAX) tablet 25 mg  25 mg Oral TID PRN Parks Ranger, DO   25 mg at 05/19/21 0645   lisinopril (ZESTRIL) tablet 10 mg  10 mg Oral Daily Parks Ranger, DO   10 mg at 05/19/21 0930   loperamide (IMODIUM) capsule 2 mg  2 mg Oral QPC breakfast Parks Ranger, DO   2 mg at 05/19/21 0940   LORazepam (ATIVAN) tablet 1 mg  1 mg Oral TID Parks Ranger, DO   1 mg at 05/19/21 0930   magnesium hydroxide (MILK OF  MAGNESIA) suspension 30 mL  30 mL Oral Daily PRN Parks Ranger, DO       multivitamin with minerals tablet 1 tablet  1 tablet Oral Daily Parks Ranger, DO   1 tablet at 05/19/21 0930   neomycin-bacitracin-polymyxin (NEOSPORIN) ointment   Topical BID Parks Ranger, DO   Given at 05/19/21 0940   ondansetron (  ZOFRAN-ODT) disintegrating tablet 4 mg  4 mg Oral Q4H PRN Parks Ranger, DO   4 mg at 05/19/21 0940   pantoprazole (PROTONIX) EC tablet 40 mg  40 mg Oral Daily Clapacs, John T, MD   40 mg at 05/19/21 0930   propranolol (INDERAL) tablet 10 mg  10 mg Oral BID Clapacs, John T, MD   10 mg at 05/19/21 0931   QUEtiapine (SEROQUEL) tablet 300 mg  300 mg Oral QHS Clapacs, John T, MD   300 mg at 05/18/21 2133   risperiDONE (RISPERDAL) tablet 0.5 mg  0.5 mg Oral BID Parks Ranger, DO   0.5 mg at 05/19/21 0930   sucralfate (CARAFATE) 1 GM/10ML suspension 1 g  1 g Oral TID WC & HS Clapacs, John T, MD   1 g at 05/19/21 1134   traZODone (DESYREL) tablet 50 mg  50 mg Oral QHS Parks Ranger, DO   50 mg at 05/18/21 2135   verapamil (CALAN-SR) CR tablet 240 mg  240 mg Oral Daily Parks Ranger, DO   240 mg at 05/19/21 0931   zolpidem (AMBIEN) tablet 10 mg  10 mg Oral QHS Clapacs, Madie Reno, MD   10 mg at 05/18/21 2134    Lab Results: No results found for this or any previous visit (from the past 48 hour(s)).  Blood Alcohol level:  Lab Results  Component Value Date   ETH <10 05/03/2021   ETH <10 35/45/6256    Metabolic Disorder Labs: Lab Results  Component Value Date   HGBA1C 5.4 05/07/2021   MPG 108 05/07/2021   MPG 105 11/12/2016   No results found for: PROLACTIN Lab Results  Component Value Date   CHOL 183 05/07/2021   TRIG 79 05/07/2021   HDL 69 05/07/2021   CHOLHDL 2.7 05/07/2021   VLDL 16 05/07/2021   LDLCALC 98 05/07/2021   LDLCALC 108 (H) 11/12/2016    Physical Findings: AIMS:  , ,  ,  ,    CIWA:    COWS:      Musculoskeletal: Strength & Muscle Tone: within normal limits Gait & Station: normal Patient leans: N/A  Psychiatric Specialty Exam:  Presentation  General Appearance: Casual  Eye Contact:Good  Speech:Clear and Coherent  Speech Volume:Normal  Handedness:Right   Mood and Affect  Mood:Euthymic  Affect:Congruent; Appropriate   Thought Process  Thought Processes:Coherent; Goal Directed  Descriptions of Associations:Intact  Orientation:Full (Time, Place and Person)  Thought Content:Logical  History of Schizophrenia/Schizoaffective disorder:No  Duration of Psychotic Symptoms:Less than six months  Hallucinations:No data recorded Ideas of Reference:None  Suicidal Thoughts:No data recorded Homicidal Thoughts:No data recorded  Sensorium  Memory:Immediate Good; Recent Good; Remote Good  Judgment:Good  Insight:Good   Executive Functions  Concentration:Good  Attention Span:Good  [place]  Language:Good   Psychomotor Activity  Psychomotor Activity:No data recorded  Assets  Assets:Communication Skills; Desire for Improvement; Financial Resources/Insurance; Housing; Leisure Time; Resilience   Sleep  Sleep:No data recorded   Physical Exam: Physical Exam Vitals and nursing note reviewed.  Constitutional:      Appearance: Normal appearance. He is normal weight.  Neurological:     General: No focal deficit present.     Mental Status: He is alert and oriented to person, place, and time.  Psychiatric:        Attention and Perception: Attention and perception normal.        Mood and Affect: Mood is anxious and depressed.  Speech: Speech normal.        Behavior: Behavior is withdrawn. Behavior is cooperative.        Thought Content: Thought content normal.        Cognition and Memory: Cognition and memory normal.        Judgment: Judgment normal.   Review of Systems  Constitutional: Negative.   HENT: Negative.     Eyes: Negative.   Respiratory: Negative.    Cardiovascular: Negative.   Gastrointestinal: Negative.   Genitourinary: Negative.   Musculoskeletal: Negative.   Skin: Negative.   Neurological: Negative.   Endo/Heme/Allergies: Negative.   Psychiatric/Behavioral:  Positive for depression. The patient is nervous/anxious.   Blood pressure (!) 145/73, pulse 85, temperature (!) 97.5 F (36.4 C), temperature source Oral, resp. rate 20, height 5' 10"  (1.778 m), weight 65.8 kg, SpO2 98 %. Body mass index is 20.81 kg/m.   Treatment Plan Summary: Daily contact with patient to assess and evaluate symptoms and progress in treatment, Medication management, and Plan continue current medications.  Hopefully discharge today or tomorrow.  Parks Ranger, DO 05/19/2021, 11:52 AM

## 2021-05-19 NOTE — Progress Notes (Signed)
Pt expressing suicidal  tendency  upon release " Im going to kill myself when I get home, I have too many problems to deal with.".  *intervention: positive affirmation, assist to room to rest.

## 2021-05-19 NOTE — [facility] Counselor (Signed)
CSW spoke with pt's care team lead, Lyndee Leo, [phone], who stated that she would speak with other team members to ensure that they will have everything in place for pt's discharge tomorrow. She stated she would call me back to ensure everything is in place or follow up with any concerns.   Shaira Sova [place], MSW, LCSW-A 12/15/202211:38 AM

## 2021-05-20 MED ORDER — FLUOXETINE HCL 20 MG PO CAPS: 20.0000 mg | ORAL_CAPSULE | Freq: Every day | ORAL | 3 refills | Status: DC

## 2021-05-20 MED ORDER — PROPRANOLOL HCL 10 MG PO TABS: 10.0000 mg | ORAL_TABLET | Freq: Two times a day (BID) | ORAL | 3 refills | Status: DC

## 2021-05-20 MED ORDER — RISPERIDONE 0.5 MG PO TABS: 0.5000 mg | ORAL_TABLET | Freq: Two times a day (BID) | ORAL | 3 refills | Status: DC

## 2021-05-20 MED ORDER — LORAZEPAM 1 MG PO TABS: 1.0000 mg | ORAL_TABLET | Freq: Three times a day (TID) | ORAL | 0 refills | Status: DC

## 2021-05-20 MED ORDER — TRAZODONE HCL 50 MG PO TABS: 50.0000 mg | ORAL_TABLET | Freq: Every day | ORAL | 3 refills | Status: DC

## 2021-05-20 MED ORDER — ZOLPIDEM TARTRATE 10 MG PO TABS: 10.0000 mg | ORAL_TABLET | Freq: Every day | ORAL | 0 refills | Status: DC

## 2021-05-20 MED ORDER — QUETIAPINE FUMARATE 300 MG PO TABS
300.0000 mg | ORAL_TABLET | Freq: Every day | ORAL | 3 refills | Status: DC
Start: 2021-05-20 — End: 2021-08-22

## 2021-05-20 NOTE — Progress Notes (Signed)
Discharge instruction provided to patient. Patient verbalize understanding. Patient discharge from the unit with all due belongings. Patient left the unit accompanied by staff in no apparent distress to the medical mall where safe transport is waiting.

## 2021-05-20 NOTE — Progress Notes (Signed)
Recreation Therapy Notes  INPATIENT RECREATION TR PLAN  Patient Details Name: Anthony Lamb MRN: [id number] DOB: Apr 04, 1956 Today's Date: 05/20/2021  Rec Therapy Plan Is patient appropriate for Therapeutic Recreation?: Yes Treatment times per week: at least 3 Estimated Length of Stay: 5-7 days TR Treatment/Interventions: Group participation (Comment)  Discharge Criteria Pt will be discharged from therapy if:: Discharged Treatment plan/goals/alternatives discussed and agreed upon by:: Patient/family  Discharge Summary Short term goals set: Patient will engage in groups without prompting or encouragement from LRT x3 group sessions within 5 recreation therapy group sessions Short term goals met: Adequate for discharge Progress toward goals comments: Groups attended Which groups?: Social skills, Goal setting Reason goals not met: N/A Therapeutic equipment acquired: N/A Reason patient discharged from therapy: Discharge from hospital Pt/family agrees with progress & goals achieved: Yes Date patient discharged from therapy: 05/20/21   [name] 05/20/2021, 2:54 PM

## 2021-05-20 NOTE — Progress Notes (Signed)
Pt threatening to throw himself on the floor to break his hip so that he is not d/c today.  Intervention: positive affirmation  escorted pt to room/bed.

## 2021-05-20 NOTE — [facility] Counselor (Signed)
CSW spoke with pt's care team, Lyndee Leo, [phone]  at [place] this morning at 9:30 am. She stated that she was good with pt returning home and the care team would meet pt at his apartment when he arrived through safe transportation post discharge.   Zvi Duplantis [place], MSW, LCSW-A 12/16/20223:49 PM

## 2021-05-20 NOTE — Progress Notes (Signed)
Patient is alert and oriented x4. Patient approached the nurse's station and stated " I have a lot of problem, I can't leave" patient also stated "I don't have no food at home, I have no money". Support and encouragement provided. Patient verbalize anxiety and depression 10/10. Reports pain 810. Patient verbalize seeing animals and hearing voices telling him to hurt himself. Verbal contract for safety. Patient approaches nurse's station again and stated, I'm going to fall and break my hip, I'm going to throw myself on the floor". Patient observed by staff attempting to throw self on the floor redirected and assisted to his room. Patient stated "I can't leave I'm coughing" writer has not heard patient cough. Ate breakfast in day room among staff and peers with good appetite. Consume 100% of ensure supplement. Patient continue with somatic complaints. Remain safe on the unit with q15 minute safety checks.

## 2021-05-20 NOTE — Discharge Summary (Signed)
Physician Discharge Summary Note  Patient:  Anthony Lamb is an 65 y.o., male MRN:  [id number] DOB:  [birth year] Patient phone:  [phone] (home)  Patient address:   [address] [address],  Total Time spent with patient: 1 hour  Date of Admission:  05/07/2021 Date of Discharge: 05/20/2021  Reason for Admission:   65 year old man transferred to [place] from [facility] where he presented to their emergency room with anxiety and depression but this time most definitely with threats that he was going to cut his wrist to kill himself.  Apparently he called 911 stating that he was going to kill himself and had himself brought to the hospital.  This is the most recent of quite a few visits to the emergency room the patient has had.  He has been seen by psychiatry a few times and previously had not been thought to need hospitalization.  On interview with me today the patient said he is extremely depressed.  He feels completely hopeless.  He has several things that he focuses on almost exclusively.  First is that he believes he is going to lose his apartment because he will not be able to pay his rent on time today.  Second is that he absolutely believes he has esophageal cancer or some kind of tumor in his throat even though this has never been assessed or diagnosed.  Says that he cannot swallow food at all.  Patient says he has been feeling terrible for months.  Has not been able to swallow.  Has been losing weight.  Denies that he is seeing a psychiatrist or therapist outside the hospital.  Admits to having threatened to cut his wrist but is not specifically threatening to harm himself in the hospital.  Patient admits he was drinking regularly up until a couple weeks ago although he minimizes the amount that he was drinking.  He says that he was still taking Ambien 10 mg at night and Ativan during the day and Seroquel 300 mg at night.  He is extremely focused on wanting to continue all of  those medicines.  Denies having any current hallucinations denies homicidal ideation.  Patient was able to remember 2 out of 3 objects at 3 minutes.  Seemed to be able to remember a lot of basic facts about his life.  Full cognitive assessment not performed  Principal Problem: MDD (major depressive disorder), recurrent episode, with atypical features [facility]) Discharge Diagnoses: Principal Problem:   MDD (major depressive disorder), recurrent episode, with atypical features ([place]) Active Problems:   Essential hypertension   PUD (peptic ulcer disease)   Malnutrition of moderate degree ([place])   Alcohol abuse   Past Psychiatric History: Waymon goes to [employer] in [place].  He has a long history of depression and constantly goes to the emergency room with suicidal ideation.  He has cut on his wrist superficially in the past.  He has a dependent personality disorder.  He lived with his mother up until 5 years ago when she passed away.  Past Medical History:  Past Medical History:  Diagnosis Date   Alcohol abuse, in remission    Anal fissure    Anemia    Ankylosing spondylitis (HCC)    Anxiety    Arthritis    Chronic diarrhea    Chronic headaches    Chronic pain    Colitis    Colon polyps    Depression    Esophagitis    Gastric polyps  hyperplastic and fundic gland   Gastritis    GERD (gastroesophageal reflux disease)    Hypertension    IBS (irritable bowel syndrome)    Poor dentition    SIADH (syndrome of inappropriate ADH production) ([place])     Past Surgical History:  Procedure Laterality Date   BIOPSY  11/26/2018   Procedure: BIOPSY;  Surgeon: Gatha Mayer, MD;  Location: [facility];  Service: Endoscopy;;   COLONOSCOPY W/ BIOPSIES     COLONOSCOPY WITH PROPOFOL N/A 11/26/2018   Procedure: COLONOSCOPY WITH PROPOFOL;  Surgeon: Gatha Mayer, MD;  Location: [facility];  Service: Endoscopy;  Laterality: N/A;   ESOPHAGOGASTRODUODENOSCOPY      ESOPHAGOGASTRODUODENOSCOPY (EGD) WITH PROPOFOL N/A 11/26/2018   Procedure: ESOPHAGOGASTRODUODENOSCOPY (EGD) WITH PROPOFOL;  Surgeon: Gatha Mayer, MD;  Location: [facility];  Service: Endoscopy;  Laterality: N/A;   HEMOSTASIS CLIP PLACEMENT  11/26/2018   Procedure: HEMOSTASIS CLIP PLACEMENT;  Surgeon: Gatha Mayer, MD;  Location: [facility];  Service: Endoscopy;;   HERNIA REPAIR Bilateral    HOT HEMOSTASIS N/A 11/26/2018   Procedure: HOT HEMOSTASIS (ARGON PLASMA COAGULATION/BICAP);  Surgeon: Gatha Mayer, MD;  Location: [facility];  Service: Endoscopy;  Laterality: N/A;   OPEN REDUCTION INTERNAL FIXATION (ORIF) DISTAL RADIAL FRACTURE Right 02/11/2018   Procedure: OPEN REDUCTION INTERNAL FIXATION (ORIF) DISTAL RADIAL FRACTURE;  Surgeon: Milly Jakob, MD;  Location: [place];  Service: Orthopedics;  Laterality: Right;   POLYPECTOMY  11/26/2018   Procedure: POLYPECTOMY;  Surgeon: Gatha Mayer, MD;  Location: [facility];  Service: Endoscopy;;   TONSILLECTOMY     Family History:  Family History  Problem Relation Age of Onset   Anxiety disorder Mother    Congestive Heart Failure Mother    Crohn's disease Mother    Colon cancer Mother 39   Arthritis Father    High blood pressure Father    Crohn's disease Father    Family Psychiatric  History: Unremarkable Social History:  Social History   Substance and Sexual Activity  Alcohol Use Not Currently   Comment: 2 25 oz of beer a night     Social History   Substance and Sexual Activity  Drug Use Yes   Types: Marijuana    Social History   Socioeconomic History   Marital status: Single    Spouse name: Not on file   Number of children: 0   Years of education: Not on file   Highest education level: Not on file  Occupational History   Not on file  Tobacco Use   Smoking status: Former    Types: Cigarettes    Quit date: 2014    Years since quitting: 8.9   Smokeless tobacco: Never  Vaping Use   Vaping Use: Never used   Substance and Sexual Activity   Alcohol use: Not Currently    Comment: 2 25 oz of beer a night   Drug use: Yes    Types: Marijuana   Sexual activity: Not Currently  Other Topics Concern   Not on file  Social History Narrative   HSG. Long - term disability - unable to work. Lived with his mother in her house - she died April 29, 2023 -    Lives in apartment   Has case worker   Social Determinants of Radio broadcast assistant Strain: Not on file  Food Insecurity: Not on file  Transportation Needs: Not on file  Physical Activity: Not on file  Stress: Not on file  Social  Connections: Not on file    Hospital Course: Mus[place] was admitted under routine orders and precautions.  He has many somatic complaints.  He presented on numerous medications.  He continued on Seroquel, Ambien at nighttime along with trazodone being added.  He complained of extreme anxiety throughout his admission.  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  He was continuing to ask for an increase in his dose.  He was started on Risperdal 0.5 mg twice a day and propranolol.  He did well on his medications but continually complained of problems with bodily functions including constipation and diarrhea stomach pain and ear pain headache eye problems nose problems etc. he was pleasant and cooperative while on the unit.  He continued to state that he was going to kill himself if he was discharged but this appeared to be malingering so that he could stay on the unit and be taken care of.  He does have a support with his outpatient services.  On the day of discharge he denied suicidal ideation, homicidal ideation, auditory or visual hallucinations.  His mood and affect had improved and he was discharged.  Physical Findings: AIMS:  , ,  ,  ,    CIWA:    COWS:     Musculoskeletal: Strength & Muscle Tone: within normal limits Gait & Station: normal Patient leans: N/A   Psychiatric Specialty  Exam:  Presentation  General Appearance: Casual  Eye Contact:Good  Speech:Clear and Coherent  Speech Volume:Normal  Handedness:Right   Mood and Affect  Mood:Euthymic  Affect:Congruent; Appropriate   Thought Process  Thought Processes:Coherent; Goal Directed  Descriptions of Associations:Intact  Orientation:Full (Time, Place and Person)  Thought Content:Logical  History of Schizophrenia/Schizoaffective disorder:No  Duration of Psychotic Symptoms:Less than six months  Hallucinations:No data recorded Ideas of Reference:None  Suicidal Thoughts:No data recorded Homicidal Thoughts:No data recorded  Sensorium  Memory:Immediate Good; Recent Good; Remote Good  Judgment:Good  Insight:Good   Executive Functions  Concentration:Good  Attention Span:Good  [place]  Language:Good   Psychomotor Activity  Psychomotor Activity:No data recorded  Assets  Assets:Communication Skills; Desire for Improvement; Financial Resources/Insurance; Housing; Leisure Time; Resilience   Sleep  Sleep:No data recorded   Physical Exam: Physical Exam ROS Blood pressure 126/76, pulse 80, temperature 98.1 F (36.7 C), temperature source Oral, resp. rate 18, height 5' 10"  (1.778 m), weight 65.8 kg, SpO2 96 %. Body mass index is 20.81 kg/m.   Social History   Tobacco Use  Smoking Status Former   Types: Cigarettes   Quit date: 2014   Years since quitting: 8.9  Smokeless Tobacco Never   Tobacco Cessation:  N/A, patient does not currently use tobacco products   Blood Alcohol level:  Lab Results  Component Value Date   ETH <10 05/03/2021   ETH <10 02/72/5366    Metabolic Disorder Labs:  Lab Results  Component Value Date   HGBA1C 5.4 05/07/2021   MPG 108 05/07/2021   MPG 105 11/12/2016   No results found for: PROLACTIN Lab Results  Component Value Date   CHOL 183 05/07/2021   TRIG 79 05/07/2021   HDL 69 05/07/2021   CHOLHDL 2.7  05/07/2021   VLDL 16 05/07/2021   LDLCALC 98 05/07/2021   LDLCALC 108 (H) 11/12/2016    See Psychiatric Specialty Exam and Suicide Risk Assessment completed by Attending Physician prior to discharge.  Discharge destination:  Home  Is patient on multiple antipsychotic therapies at discharge:  No  Has Patient had three or more failed trials of antipsychotic monotherapy by history:  No  Recommended Plan for Multiple Antipsychotic Therapies: Additional reason(s) for multiple antispychotic treatment:  Depression, Anxiety, and Personality Disorder   Allergies as of 05/20/2021       Reactions   Nsaids Other (See Comments)   GI upset- history of peptic ulcers!!   Diphenhydramine Hcl Other (See Comments)   Restlessness   Flexeril [cyclobenzaprine] Other (See Comments)   Restlessness   Hctz [hydrochlorothiazide] Other (See Comments)   Caused to lose sodium when taking with Lisinopril   Rexulti [brexpiprazole] Other (See Comments)   "Made me not feel right- restless"   Seroquel [quetiapine] Other (See Comments)   Restless legs and makes the patient sweat- also does not help patient's insomnia. Pt takes this at home   Gabapentin Diarrhea   Hydroxyzine Other (See Comments)   Restlessness        Medication List     STOP taking these medications    acetaminophen 500 MG tablet Commonly known as: TYLENOL   chlordiazePOXIDE 25 MG capsule Commonly known as: LIBRIUM   famotidine 20 MG tablet Commonly known as: PEPCID   lisinopril 10 MG tablet Commonly known as: ZESTRIL   ondansetron 4 MG disintegrating tablet Commonly known as: ZOFRAN-ODT   sucralfate 1 g tablet Commonly known as: CARAFATE   sucralfate 1 GM/10ML suspension Commonly known as: Carafate   tiZANidine 4 MG tablet Commonly known as: ZANAFLEX   verapamil 240 MG CR tablet Commonly known as: CALAN-SR       TAKE these medications      Indication  diclofenac Sodium 1 % Gel Commonly known as:  VOLTAREN Apply 2 g topically 4 (four) times daily as needed (to painful sites).    FLUoxetine 20 MG capsule Commonly known as: PROZAC Take 1 capsule (20 mg total) by mouth daily. Start taking on: May 21, 2021    LORazepam 1 MG tablet Commonly known as: ATIVAN Take 1 tablet (1 mg total) by mouth 3 (three) times daily.    multivitamin with minerals Tabs tablet Take 1 tablet by mouth daily. (May buy over the counter) What changed: additional instructions  Indication: Supplementation   pantoprazole 40 MG tablet Commonly known as: PROTONIX Take 1 tablet (40 mg total) by mouth daily. What changed: when to take this  Indication: Gastroesophageal Reflux Disease   propranolol 10 MG tablet Commonly known as: INDERAL Take 1 tablet (10 mg total) by mouth 2 (two) times daily.    QUEtiapine 300 MG tablet Commonly known as: SEROQUEL Take 1 tablet (300 mg total) by mouth at bedtime.    risperiDONE 0.5 MG tablet Commonly known as: RISPERDAL Take 1 tablet (0.5 mg total) by mouth 2 (two) times daily.    traZODone 50 MG tablet Commonly known as: DESYREL Take 1 tablet (50 mg total) by mouth at bedtime.    zolpidem 10 MG tablet Commonly known as: AMBIEN Take 1 tablet (10 mg total) by mouth at bedtime. What changed:  medication strength how much to take         Follow-up Information     [facility] Follow up.   Why: You have an appointment scheduled with your therapist Lyndee Leo within the next two weeks. Your team member will contact you regarding the exact date they will visit your home. Thanks! Contact information: [address], [place] [address]         Phone: [phone]   Fax:  Follow-up recommendations:  [facility]   Signed: [place], DO 05/20/2021, 11:20 AM

## 2021-05-20 NOTE — BHH Suicide Risk Assessment (Signed)
[facility] Discharge Suicide Risk Assessment   Principal Problem: MDD (major depressive disorder), recurrent episode, with atypical features [facility]) Discharge Diagnoses: Principal Problem:   MDD (major depressive disorder), recurrent episode, with atypical features ([place]) Active Problems:   Essential hypertension   PUD (peptic ulcer disease)   Malnutrition of moderate degree ([place])   Alcohol abuse  Ascenc[place] continues to be withdrawn and depressed.  He complains of a lot of anxiety and has a lot of somatic complaints.  He denies any suicidal ideation.  He does not want to go home because he is being taken care of here.  He does live alone in an apartment in [place].  He lived with his mom up until 5 years ago when she passed away.  He is taking his medications as prescribed and denies any side effects.  Social work is working on getting him transportation and follow-up through his outpatient provider and support.  Total Time spent with patient: 1 hour  Musculoskeletal: Strength & Muscle Tone: within normal limits Gait & Station: normal Patient leans: N/A  Psychiatric Specialty Exam  Presentation  General Appearance: Casual  Eye Contact:Good  Speech:Clear and Coherent  Speech Volume:Normal  Handedness:Right   Mood and Affect  Mood:Euthymic  Duration of Depression Symptoms: Greater than two weeks  Affect:Congruent; Appropriate   Thought Process  Thought Processes:Coherent; Goal Directed  Descriptions of Associations:Intact  Orientation:Full (Time, Place and Person)  Thought Content:Logical  History of Schizophrenia/Schizoaffective disorder:No  Duration of Psychotic Symptoms:Less than six months  Hallucinations:No data recorded Ideas of Reference:None  Suicidal Thoughts:No data recorded Homicidal Thoughts:No data recorded  Sensorium  Memory:Immediate Good; Recent Good; Remote Good  Judgment:Good  Insight:Good   Executive Functions   Concentration:Good  Attention Span:Good  [place]  Language:Good   Psychomotor Activity  Psychomotor Activity:No data recorded  Assets  Assets:Communication Skills; Desire for Improvement; Financial Resources/Insurance; Housing; Leisure Time; Resilience   Sleep  Sleep:No data recorded  Physical Exam: Physical Exam Vitals and nursing note reviewed.  Constitutional:      Appearance: Normal appearance. He is normal weight.  Neurological:     General: No focal deficit present.     Mental Status: He is alert and oriented to person, place, and time.  Psychiatric:        Attention and Perception: Attention and perception normal.        Mood and Affect: Mood is anxious. Affect is flat.        Behavior: Behavior normal. Behavior is cooperative.        Thought Content: Thought content normal.        Cognition and Memory: Cognition and memory normal.        Judgment: Judgment normal.   Review of Systems  Constitutional: Negative.   HENT: Negative.    Eyes: Negative.   Respiratory: Negative.    Cardiovascular: Negative.   Gastrointestinal: Negative.   Genitourinary: Negative.   Musculoskeletal: Negative.   Skin: Negative.   Neurological: Negative.   Endo/Heme/Allergies: Negative.   Psychiatric/Behavioral: Negative.    Blood pressure 126/76, pulse 80, temperature 98.1 F (36.7 C), temperature source Oral, resp. rate 18, height 5' 10"  (1.778 m), weight 65.8 kg, SpO2 96 %. Body mass index is 20.81 kg/m.  Mental Status Per Nursing Assessment::   On Admission:  NA  Demographic Factors:  Male, Age 65 or older, Caucasian, Low socioeconomic status, and Living alone  Loss Factors: Loss of significant relationship and Financial problems/change in socioeconomic status  Historical  Factors: Impulsivity  Risk Reduction Factors:   NA  Continued Clinical Symptoms:  Severe Anxiety and/or Agitation Dysthymia Previous Psychiatric Diagnoses and  Treatments  Cognitive Features That Contribute To Risk:  Polarized thinking    Suicide Risk:  Moderate:  Frequent suicidal ideation with limited intensity, and duration, some specificity in terms of plans, no associated intent, good self-control, limited dysphoria/symptomatology, some risk factors present, and identifiable protective factors, including available and accessible social support.   Follow-up Information     [facility] Follow up.   Why: You have an appointment scheduled with your therapist Lyndee Leo within the next two weeks. Your team member will contact you regarding the exact date they will visit your home. Thanks! Contact information: [address], [place] [address]         Phone: [phone]   Fax:                Plan Of Care/Follow-up recommendations: [place], DO 05/20/2021, 10:44 AM

## 2021-05-20 NOTE — Plan of Care (Signed)
°  Problem: Group Participation Goal: STG - Patient will engage in groups without prompting or encouragement from LRT x3 group sessions within 5 recreation therapy group sessions Description: STG - Patient will engage in groups without prompting or encouragement from LRT x3 group sessions within 5 recreation therapy group sessions 05/20/2021 1452 by Ernest Haber, LRT Outcome: Adequate for Discharge 05/20/2021 1452 by Ernest Haber, LRT Outcome: Adequate for Discharge 05/20/2021 1452 by Ernest Haber, LRT Outcome: Adequate for Discharge

## 2021-05-20 NOTE — Progress Notes (Signed)
°  [facility] Adult Case Management Discharge Plan :  Will you be returning to the same living situation after discharge:  Yes,  pt returning to his apartment At discharge, do you have transportation home?: Yes,  CSW will assist pt with arranging transportation Do you have the ability to pay for your medications: Yes,  pt has Medicaid  Release of information consent forms completed and in the chart;  Patient's signature needed at discharge.  Patient to Follow up at:  Follow-up Information     [facility] Follow up.   Why: You have an appointment scheduled with your therapist Lyndee Leo within the next two weeks. Your team member will contact you regarding the exact date they will visit your home. Thanks! Contact information: [address], [place] [address]         Phone: [phone]   Fax:                Next level of care provider has access to [place] and Suicide Prevention discussed: Yes,  completed with pt's care provider     Has patient been referred to the Quitline?: Patient refused referral  Patient has been referred for addiction treatment: Pt. refused referral  Drayk Humbarger A [place], [place] 05/20/2021, 10:37 AM

## 2021-05-20 NOTE — Progress Notes (Signed)
Recreation Therapy Notes  Date: 05/20/2021  Time: 1:05pm    Location: Day room   Behavioral response: N/A   Intervention Topic: Social Skills   Discussion/Intervention: Patient did not attend group.   Clinical Observations/Feedback:  Patient did not attend group.    Komal Stangelo LRT/CTRS        Lux Meaders 05/20/2021 2:22 PM

## 2021-05-21 ENCOUNTER — Emergency Department (HOSPITAL_COMMUNITY)
Admission: EM | Admit: 2021-05-21 | Discharge: 2021-05-21 | Disposition: A | Discharge status: Home / Self Care | Payer: Medicare Other | Attending: Emergency Medicine | Admitting: Emergency Medicine

## 2021-05-21 ENCOUNTER — Encounter (HOSPITAL_COMMUNITY): Payer: Self-pay

## 2021-05-21 DIAGNOSIS — I10 Essential (primary) hypertension: Secondary | ICD-10-CM | POA: Insufficient documentation

## 2021-05-21 DIAGNOSIS — R110 Nausea: Secondary | ICD-10-CM | POA: Diagnosis not present

## 2021-05-21 DIAGNOSIS — Z87891 Personal history of nicotine dependence: Secondary | ICD-10-CM | POA: Insufficient documentation

## 2021-05-21 DIAGNOSIS — Z20822 Contact with and (suspected) exposure to covid-19: Secondary | ICD-10-CM | POA: Diagnosis not present

## 2021-05-21 DIAGNOSIS — R519 Headache, unspecified: Secondary | ICD-10-CM | POA: Diagnosis not present

## 2021-05-21 DIAGNOSIS — F0390 Unspecified dementia without behavioral disturbance: Secondary | ICD-10-CM | POA: Diagnosis not present

## 2021-05-21 DIAGNOSIS — M542 Cervicalgia: Secondary | ICD-10-CM | POA: Diagnosis not present

## 2021-05-21 DIAGNOSIS — R42 Dizziness and giddiness: Secondary | ICD-10-CM | POA: Insufficient documentation

## 2021-05-21 DIAGNOSIS — R630 Anorexia: Secondary | ICD-10-CM | POA: Insufficient documentation

## 2021-05-21 LAB — LIPASE, BLOOD: Lipase: 28 U/L (ref 11–51)

## 2021-05-21 LAB — CBC WITH DIFFERENTIAL/PLATELET
Abs Immature Granulocytes: 0.05 10*3/uL (ref 0.00–0.07)
Basophils Absolute: 0 10*3/uL (ref 0.0–0.1)
Basophils Relative: 0 %
Eosinophils Absolute: 0 10*3/uL (ref 0.0–0.5)
Eosinophils Relative: 0 %
HCT: 32.9 % — ABNORMAL LOW (ref 39.0–52.0)
Hemoglobin: 11.2 g/dL — ABNORMAL LOW (ref 13.0–17.0)
Immature Granulocytes: 0 %
Lymphocytes Relative: 7 %
Lymphs Abs: 0.8 10*3/uL (ref 0.7–4.0)
MCH: 31.3 pg (ref 26.0–34.0)
MCHC: 34 g/dL (ref 30.0–36.0)
MCV: 91.9 fL (ref 80.0–100.0)
Monocytes Absolute: 0.9 10*3/uL (ref 0.1–1.0)
Monocytes Relative: 8 %
Neutro Abs: 10.2 10*3/uL — ABNORMAL HIGH (ref 1.7–7.7)
Neutrophils Relative %: 85 %
Platelets: 240 10*3/uL (ref 150–400)
RBC: 3.58 MIL/uL — ABNORMAL LOW (ref 4.22–5.81)
RDW: 12.7 % (ref 11.5–15.5)
WBC: 12 10*3/uL — ABNORMAL HIGH (ref 4.0–10.5)
nRBC: 0 % (ref 0.0–0.2)

## 2021-05-21 LAB — RESP PANEL BY RT-PCR (FLU A&B, COVID) ARPGX2
Influenza A by PCR: NEGATIVE
Influenza B by PCR: NEGATIVE
SARS Coronavirus 2 by RT PCR: NEGATIVE

## 2021-05-21 LAB — COMPREHENSIVE METABOLIC PANEL
ALT: 16 U/L (ref 0–44)
AST: 17 U/L (ref 15–41)
Albumin: 4 g/dL (ref 3.5–5.0)
Alkaline Phosphatase: 66 U/L (ref 38–126)
Anion gap: 12 (ref 5–15)
BUN: 12 mg/dL (ref 8–23)
CO2: 24 mmol/L (ref 22–32)
Calcium: 8.7 mg/dL — ABNORMAL LOW (ref 8.9–10.3)
Chloride: 88 mmol/L — ABNORMAL LOW (ref 98–111)
Creatinine, Ser: 0.66 mg/dL (ref 0.61–1.24)
GFR, Estimated: >60 mL/min (ref >60)
Glucose, Bld: 110 mg/dL — ABNORMAL HIGH (ref 70–99)
Potassium: 3.5 mmol/L (ref 3.5–5.1)
Sodium: 124 mmol/L — ABNORMAL LOW (ref 135–145)
Total Bilirubin: 0.6 mg/dL (ref 0.3–1.2)
Total Protein: 6.8 g/dL (ref 6.5–8.1)

## 2021-05-21 LAB — URINALYSIS, ROUTINE W REFLEX MICROSCOPIC
Bilirubin Urine: NEGATIVE
Glucose, UA: NEGATIVE mg/dL
Hgb urine dipstick: NEGATIVE
Ketones, ur: NEGATIVE mg/dL
Leukocytes,Ua: NEGATIVE
Nitrite: NEGATIVE
Protein, ur: NEGATIVE mg/dL
Specific Gravity, Urine: 1.005 (ref 1.005–1.030)
pH: 7 (ref 5.0–8.0)

## 2021-05-21 LAB — TROPONIN I (HIGH SENSITIVITY): Troponin I (High Sensitivity): <2 ng/L (ref <18)

## 2021-05-21 MED ORDER — SODIUM CHLORIDE 0.9 % IV BOLUS
1000.0000 mL | Freq: Once | INTRAVENOUS | Status: AC
  Administered 2021-05-21: 1000 mL via INTRAVENOUS

## 2021-05-21 MED ORDER — ACETAMINOPHEN 500 MG PO TABS
1000.0000 mg | ORAL_TABLET | Freq: Once | ORAL | Status: AC
  Administered 2021-05-21: 1000 mg via ORAL
  Filled 2021-05-21: qty 2

## 2021-05-21 MED ORDER — LORAZEPAM 2 MG/ML IJ SOLN
1.0000 mg | Freq: Once | INTRAMUSCULAR | Status: AC
  Administered 2021-05-21: 1 mg via INTRAVENOUS
  Filled 2021-05-21: qty 1

## 2021-05-21 MED ORDER — ONDANSETRON HCL 4 MG/2ML IJ SOLN
4.0000 mg | Freq: Once | INTRAMUSCULAR | Status: AC
  Administered 2021-05-21: 4 mg via INTRAVENOUS
  Filled 2021-05-21: qty 2

## 2021-05-21 NOTE — ED Notes (Signed)
Pt keeps complaining of pain, anxiety and keeps getting up from his wheelchair. Pt not very steady on gait.

## 2021-05-21 NOTE — ED Notes (Signed)
Pt c/o pain.

## 2021-05-21 NOTE — ED Triage Notes (Addendum)
Pt arrives via EMS. Pt states since being released from a psychiatric hospital, he has been dizzy, lightheaded, and experiencing nausea,vomiting, severe headache, and neck pain.

## 2021-05-21 NOTE — ED Provider Notes (Signed)
Anthony Lamb Provider Note   CSN: [id number] Arrival date & time: 05/21/21  1548     History Chief Complaint  Patient presents with   Nausea   Dizziness   Neck Pain   Headache    Anthony Lamb is a 65 y.o. male.  HPI Patient with multiple medical issues including alcohol abuse, depression, multiple presentations for suicidal ideation and both now malnutrition and SIADH now presents with concern for nausea, headache.  Patient states that since being discharged yesterday from our affiliated behavioral health facility he has had generalized discomfort, nausea, anorexia, headache.  No clear description for characteristics of the headache.  No fall, no weakness.  Patient does not specify whether he has had a recurrence of suicidal ideation but on chart review is clear he has had this is a presenting complaint multiple prior visits.  He has been seen and evaluated 25 times in the past 6 months in our facilities.    Past Medical History:  Diagnosis Date   Alcohol abuse, in remission    Anal fissure    Anemia    Ankylosing spondylitis (HCC)    Anxiety    Arthritis    Chronic diarrhea    Chronic headaches    Chronic pain    Colitis    Colon polyps    Depression    Esophagitis    Gastric polyps    hyperplastic and fundic gland   Gastritis    GERD (gastroesophageal reflux disease)    Hypertension    IBS (irritable bowel syndrome)    Poor dentition    SIADH (syndrome of inappropriate ADH production) ([place])     Patient Active Problem List   Diagnosis Date Noted   MDD (major depressive disorder), recurrent episode, with atypical features ([place]) 05/07/2021   Suicidal ideation 04/05/2021   MDD (major depressive disorder), recurrent episode, severe ([place]) 03/31/2021   Substance induced mood disorder ([place]) 03/31/2021   Family history of colon cancer 12/02/2018   Gastric polyps    Schatzki's ring    Left sided ulcerative colitis without  complication ([place])    Odynophagia 11/25/2018   Dysphagia 11/25/2018   Major depressive disorder, recurrent severe without psychotic features ([place]) 06/24/2018   IBS (irritable bowel syndrome)    Hypertension    Colitis    Adjustment disorder with mixed anxiety and depressed mood 05/11/2017   Alcohol abuse    Anxiety    Gastroesophageal reflux disease    Osteopenia determined by x-ray 08/06/2014   Stress fracture of calcaneus 08/06/2014   Vitamin D deficiency 12/18/2013   Dementia ([place]) 12/18/2013   Benign microscopic hematuria 07/06/2013   SIADH (syndrome of inappropriate ADH production) ([place]) 06/22/2013   Ankylosing spondylitis ([place]) 06/20/2013   Malnutrition of moderate degree ([place]) 06/20/2013   BPH (benign prostatic hyperplasia) 08/22/2010   History of colonic polyps 08/13/2008   Essential hypertension 07/03/2007   PUD (peptic ulcer disease) 07/03/2007    Past Surgical History:  Procedure Laterality Date   BIOPSY  11/26/2018   Procedure: BIOPSY;  Surgeon: Gatha Mayer, MD;  Location: [facility];  Service: Endoscopy;;   COLONOSCOPY W/ BIOPSIES     COLONOSCOPY WITH PROPOFOL N/A 11/26/2018   Procedure: COLONOSCOPY WITH PROPOFOL;  Surgeon: Gatha Mayer, MD;  Location: [facility];  Service: Endoscopy;  Laterality: N/A;   ESOPHAGOGASTRODUODENOSCOPY     ESOPHAGOGASTRODUODENOSCOPY (EGD) WITH PROPOFOL N/A 11/26/2018   Procedure: ESOPHAGOGASTRODUODENOSCOPY (EGD) WITH PROPOFOL;  Surgeon: Gatha Mayer, MD;  [facility];  Service: Endoscopy;  Laterality: N/A;   HEMOSTASIS CLIP PLACEMENT  11/26/2018   Procedure: HEMOSTASIS CLIP PLACEMENT;  Surgeon: Gatha Mayer, MD;  [facility];  Service: Endoscopy;;   HERNIA REPAIR Bilateral    HOT HEMOSTASIS N/A 11/26/2018   Procedure: HOT HEMOSTASIS (ARGON PLASMA COAGULATION/BICAP);  Surgeon: Gatha Mayer, MD;  Location: [facility];  Service: Endoscopy;  Laterality: N/A;   OPEN REDUCTION INTERNAL FIXATION (ORIF)  DISTAL RADIAL FRACTURE Right 02/11/2018   Procedure: OPEN REDUCTION INTERNAL FIXATION (ORIF) DISTAL RADIAL FRACTURE;  Surgeon: Milly Jakob, MD;  Location: [facility];  Service: Orthopedics;  Laterality: Right;   POLYPECTOMY  11/26/2018   Procedure: POLYPECTOMY;  Surgeon: Gatha Mayer, MD;  [facility];  Service: Endoscopy;;   TONSILLECTOMY         Family History  Problem Relation Age of Onset   Anxiety disorder Mother    Congestive Heart Failure Mother    Crohn's disease Mother    Colon cancer Mother 17   Arthritis Father    High blood pressure Father    Crohn's disease Father     Social History   Tobacco Use   Smoking status: Former    Types: Cigarettes    Quit date: 2014    Years since quitting: 8.9   Smokeless tobacco: Never  Vaping Use   Vaping Use: Never used  Substance Use Topics   Alcohol use: Not Currently   Drug use: Yes    Types: Marijuana    Home Medications Prior to Admission medications   Medication Sig Start Date End Date Taking? Authorizing Provider  diclofenac Sodium (VOLTAREN) 1 % GEL Apply 2 g topically 4 (four) times daily as needed (to painful sites). Patient not taking: Reported on 05/03/2021    [provider]  FLUoxetine (PROZAC) 20 MG capsule Take 1 capsule (20 mg total) by mouth daily. 05/21/21   Parks Ranger, DO  LORazepam (ATIVAN) 1 MG tablet Take 1 tablet (1 mg total) by mouth 3 (three) times daily. 05/20/21   Parks Ranger, DO  meloxicam (MOBIC) 15 MG tablet Take 15 mg by mouth daily. 05/05/21   [provider]  Multiple Vitamin (MULTIVITAMIN WITH MINERALS) TABS tablet Take 1 tablet by mouth daily. (May buy over the counter) Patient taking differently: Take 1 tablet by mouth daily. 06/28/18   Connye Burkitt, NP  pantoprazole (PROTONIX) 40 MG tablet Take 1 tablet (40 mg total) by mouth daily. Patient taking differently: Take 40 mg by mouth in the morning. 04/24/14   Janith Lima, MD   propranolol (INDERAL) 10 MG tablet Take 1 tablet (10 mg total) by mouth 2 (two) times daily. 05/20/21   Parks Ranger, DO  QUEtiapine (SEROQUEL) 300 MG tablet Take 1 tablet (300 mg total) by mouth at bedtime. 05/20/21   Parks Ranger, DO  REXULTI 2 MG TABS tablet Take 2 mg by mouth daily. 05/05/21   [provider]  risperiDONE (RISPERDAL) 0.5 MG tablet Take 1 tablet (0.5 mg total) by mouth 2 (two) times daily. 05/20/21   Parks Ranger, DO  traZODone (DESYREL) 50 MG tablet Take 1 tablet (50 mg total) by mouth at bedtime. 05/20/21   Parks Ranger, DO  zolpidem (AMBIEN) 10 MG tablet Take 1 tablet (10 mg total) by mouth at bedtime. 05/20/21   Parks Ranger, DO    Allergies    Nsaids, Diphenhydramine hcl, Flexeril [cyclobenzaprine], Hctz [hydrochlorothiazide], Rexulti [brexpiprazole], Seroquel [  quetiapine], Gabapentin, and Hydroxyzine  Review of Systems   Review of Systems  Constitutional:        Per HPI, otherwise negative  HENT:         Per HPI, otherwise negative  Respiratory:         Per HPI, otherwise negative  Cardiovascular:        Per HPI, otherwise negative  Gastrointestinal:  Negative for vomiting.  Endocrine:       Negative aside from HPI  Genitourinary:        Neg aside from HPI   Musculoskeletal:        Per HPI, otherwise negative  Skin: Negative.   Neurological:  Positive for headaches. Negative for syncope.   Physical Exam Updated Vital Signs BP (!) 142/68    Pulse 81    Temp 98.3 F (36.8 C) (Oral)    Resp 18    SpO2 100%   Physical Exam Vitals and nursing note reviewed.  Constitutional:      General: He is not in acute distress.    Appearance: He is well-developed.  HENT:     Head: Normocephalic and atraumatic.  Eyes:     Conjunctiva/sclera: Conjunctivae normal.  Cardiovascular:     Rate and Rhythm: Normal rate and regular rhythm.  Pulmonary:     Effort: Pulmonary effort is normal. No respiratory  distress.     Breath sounds: No stridor.  Abdominal:     General: There is no distension.  Skin:    General: Skin is warm and dry.  Neurological:     Mental Status: He is alert and oriented to person, place, and time.  Psychiatric:        Mood and Affect: Mood is anxious.    ED Results / Procedures / Treatments   Labs (all labs ordered are listed, but only abnormal results are displayed) Labs Reviewed  CBC WITH DIFFERENTIAL/PLATELET - Abnormal; Notable for the following components:      Result Value   WBC 12.0 (*)    RBC 3.58 (*)    Hemoglobin 11.2 (*)    HCT 32.9 (*)    Neutro Abs 10.2 (*)    All other components within normal limits  COMPREHENSIVE METABOLIC PANEL - Abnormal; Notable for the following components:   Sodium 124 (*)    Chloride 88 (*)    Glucose, Bld 110 (*)    Calcium 8.7 (*)    All other components within normal limits  LIPASE, BLOOD  URINALYSIS, ROUTINE W REFLEX MICROSCOPIC  TROPONIN I (HIGH SENSITIVITY)  TROPONIN I (HIGH SENSITIVITY)    EKG EKG Interpretation  Date/Time:  Saturday May 21 2021 16:05:02 EST Ventricular Rate:  65 PR Interval:  171 QRS Duration: 113 QT Interval:  459 QTC Calculation: 478 R Axis:   60 Text Interpretation: Sinus rhythm Borderline intraventricular conduction delay Borderline prolonged QT interval Confirmed by Sherwood Gambler [phone]) on 05/21/2021 4:07:58 PM  Radiology No results found.  Procedures Procedures   Medications Ordered in ED Medications  sodium chloride 0.9 % bolus 1,000 mL (has no administration in time range)  ondansetron (ZOFRAN) injection 4 mg (has no administration in time range)  acetaminophen (TYLENOL) tablet 1,000 mg (has no administration in time range)    ED Course  I have reviewed the triage vital signs and the nursing notes.  Pertinent labs & imaging results that were available during my care of the patient were reviewed by me and considered in my medical decision making (  see  chart for details).  Chart review, as above notable for multiple prior ED evaluations for suicidal ideation and hospitalization ending yesterday for depression with instructions to follow-up with his outpatient psychiatry team.  10:33 PM Hemodynamically unremarkable, awake, alert, he and I discussed his history, he notes that his neck hurts, but it has hurt for a very long time with history of ankylosing spondylitis.  He has no abdominal pain, no fever, no vomiting, no syncope.  We discussed his chronic issues, the importance of following up with discharge plan provided yesterday on discharge from [facility] hospital.  Here labs notable for mild hyponatremia, mild leukocytosis, but no fever, no increased work of breathing, no cough, no evidence for concurrent infection some suspicion for medication effect versus stress reaction.  Patient received fluid resuscitation with normal saline 1 L, had Ativan for his reported anxiety and nausea, had no episode for decompensation, is appropriate for close outpatient follow-up. MDM Rules/Calculators/A&P MDM Number of Diagnoses or Management Options Nausea: established, worsening   Amount and/or Complexity of Data Reviewed Clinical lab tests: reviewed and ordered Tests in the medicine section of CPT: ordered and reviewed Decide to obtain previous medical records or to obtain history from someone other than the patient: yes Review and summarize past medical records: yes Discuss the patient with other providers: yes Independent visualization of images, tracings, or specimens: yes  Risk of Complications, Morbidity, and/or Mortality Presenting problems: high Diagnostic procedures: high  Critical Care Total time providing critical care: < 30 minutes  Patient Progress Patient progress: stable   Final Clinical Impression(s) / ED Diagnoses Final diagnoses:  Nausea     Carmin Muskrat, MD 05/21/21 2235

## 2021-05-21 NOTE — Discharge Instructions (Signed)
Today's evaluation has been generally reassuring aside from mild electrolyte abnormalities.  It is very important that you obtain and take all of your medication as directed and follow through on the discharge plan provided yesterday when you are discharged from [facility].  This includes following up with your psychiatrist and possibly your primary care physician, take all medication as directed, and monitoring your condition.  Return here if you develop new, or concerning changes in your condition.

## 2021-05-21 NOTE — ED Provider Notes (Signed)
Emergency Medicine Provider Triage Evaluation Note  Anthony Lamb , a 65 y.o. male  was evaluated in triage.  Pt complains of abd pain, nv, neck pain, unsteadiness.  Recently released from behavioral health facility  Review of Systems  Positive: Nv, abd pain, neck pain, unsteadiness Negative: fever  Physical Exam  There were no vitals taken for this visit. Gen:   Awake, no distress   Resp:  Normal effort  MSK:   Moves extremities without difficulty  Other:  Abd soft and nontender, clear speech  Medical Decision Making  Medically screening exam initiated at 3:56 PM.  Appropriate orders placed.  KRISHAV MAMONE was informed that the remainder of the evaluation will be completed by another provider, this initial triage assessment does not replace that evaluation, and the importance of remaining in the ED until their evaluation is complete.     Bishop Dublin 05/21/21 1557    Carmin Muskrat, MD 05/21/21 [phone]

## 2021-05-26 ENCOUNTER — Encounter: Payer: Self-pay | Admitting: Internal Medicine

## 2021-06-02 ENCOUNTER — Other Ambulatory Visit: Payer: Self-pay

## 2021-06-02 ENCOUNTER — Emergency Department (HOSPITAL_BASED_OUTPATIENT_CLINIC_OR_DEPARTMENT_OTHER)
Admission: EM | Admit: 2021-06-02 | Discharge: 2021-06-02 | Disposition: A | Discharge status: Home / Self Care | Payer: Medicare Other | Attending: Emergency Medicine | Admitting: Emergency Medicine

## 2021-06-02 ENCOUNTER — Emergency Department (HOSPITAL_BASED_OUTPATIENT_CLINIC_OR_DEPARTMENT_OTHER): Payer: Medicare Other

## 2021-06-02 ENCOUNTER — Encounter (HOSPITAL_BASED_OUTPATIENT_CLINIC_OR_DEPARTMENT_OTHER): Payer: Self-pay | Admitting: Emergency Medicine

## 2021-06-02 DIAGNOSIS — F0390 Unspecified dementia without behavioral disturbance: Secondary | ICD-10-CM | POA: Diagnosis not present

## 2021-06-02 DIAGNOSIS — Z79899 Other long term (current) drug therapy: Secondary | ICD-10-CM | POA: Diagnosis not present

## 2021-06-02 DIAGNOSIS — I10 Essential (primary) hypertension: Secondary | ICD-10-CM | POA: Insufficient documentation

## 2021-06-02 DIAGNOSIS — E860 Dehydration: Secondary | ICD-10-CM | POA: Insufficient documentation

## 2021-06-02 DIAGNOSIS — F419 Anxiety disorder, unspecified: Secondary | ICD-10-CM | POA: Diagnosis not present

## 2021-06-02 DIAGNOSIS — Z87891 Personal history of nicotine dependence: Secondary | ICD-10-CM | POA: Diagnosis not present

## 2021-06-02 LAB — CBC WITH DIFFERENTIAL/PLATELET
Abs Immature Granulocytes: 0.02 10*3/uL (ref 0.00–0.07)
Basophils Absolute: 0.1 10*3/uL (ref 0.0–0.1)
Basophils Relative: 1 %
Eosinophils Absolute: 0.1 10*3/uL (ref 0.0–0.5)
Eosinophils Relative: 1 %
HCT: 37.8 % — ABNORMAL LOW (ref 39.0–52.0)
Hemoglobin: 13 g/dL (ref 13.0–17.0)
Immature Granulocytes: 0 %
Lymphocytes Relative: 12 %
Lymphs Abs: 1.4 10*3/uL (ref 0.7–4.0)
MCH: 31.6 pg (ref 26.0–34.0)
MCHC: 34.4 g/dL (ref 30.0–36.0)
MCV: 91.7 fL (ref 80.0–100.0)
Monocytes Absolute: 0.6 10*3/uL (ref 0.1–1.0)
Monocytes Relative: 5 %
Neutro Abs: 9.4 10*3/uL — ABNORMAL HIGH (ref 1.7–7.7)
Neutrophils Relative %: 81 %
Platelets: 335 10*3/uL (ref 150–400)
RBC: 4.12 MIL/uL — ABNORMAL LOW (ref 4.22–5.81)
RDW: 12.7 % (ref 11.5–15.5)
WBC: 11.6 10*3/uL — ABNORMAL HIGH (ref 4.0–10.5)
nRBC: 0 % (ref 0.0–0.2)

## 2021-06-02 LAB — COMPREHENSIVE METABOLIC PANEL
ALT: 8 U/L (ref 0–44)
AST: 12 U/L — ABNORMAL LOW (ref 15–41)
Albumin: 4 g/dL (ref 3.5–5.0)
Alkaline Phosphatase: 64 U/L (ref 38–126)
Anion gap: 8 (ref 5–15)
BUN: 5 mg/dL — ABNORMAL LOW (ref 8–23)
CO2: 27 mmol/L (ref 22–32)
Calcium: 8.9 mg/dL (ref 8.9–10.3)
Chloride: 99 mmol/L (ref 98–111)
Creatinine, Ser: 0.68 mg/dL (ref 0.61–1.24)
GFR, Estimated: >60 mL/min (ref >60)
Glucose, Bld: 103 mg/dL — ABNORMAL HIGH (ref 70–99)
Potassium: 3.9 mmol/L (ref 3.5–5.1)
Sodium: 134 mmol/L — ABNORMAL LOW (ref 135–145)
Total Bilirubin: 0.5 mg/dL (ref 0.3–1.2)
Total Protein: 6.7 g/dL (ref 6.5–8.1)

## 2021-06-02 LAB — URINALYSIS, ROUTINE W REFLEX MICROSCOPIC
Bilirubin Urine: NEGATIVE
Glucose, UA: NEGATIVE mg/dL
Hgb urine dipstick: NEGATIVE
Ketones, ur: NEGATIVE mg/dL
Leukocytes,Ua: NEGATIVE
Nitrite: NEGATIVE
Protein, ur: NEGATIVE mg/dL
Specific Gravity, Urine: 1.006 (ref 1.005–1.030)
pH: 7.5 (ref 5.0–8.0)

## 2021-06-02 LAB — RAPID URINE DRUG SCREEN, HOSP PERFORMED
Amphetamines: NOT DETECTED
Barbiturates: NOT DETECTED
Benzodiazepines: POSITIVE — AB
Cocaine: NOT DETECTED
Opiates: NOT DETECTED
Tetrahydrocannabinol: NOT DETECTED

## 2021-06-02 MED ORDER — LACTATED RINGERS IV BOLUS
1000.0000 mL | Freq: Once | INTRAVENOUS | Status: AC
  Administered 2021-06-02: 07:00:00 1000 mL via INTRAVENOUS

## 2021-06-02 MED ORDER — LORAZEPAM 1 MG PO TABS
1.0000 mg | ORAL_TABLET | Freq: Once | ORAL | Status: AC
  Administered 2021-06-02: 09:00:00 1 mg via ORAL
  Filled 2021-06-02: qty 1

## 2021-06-02 MED ORDER — ONDANSETRON HCL 4 MG/2ML IJ SOLN
4.0000 mg | Freq: Once | INTRAMUSCULAR | Status: AC
  Administered 2021-06-02: 07:00:00 4 mg via INTRAVENOUS
  Filled 2021-06-02: qty 2

## 2021-06-02 MED ORDER — LORAZEPAM 1 MG PO TABS
1.0000 mg | ORAL_TABLET | Freq: Once | ORAL | Status: AC
  Administered 2021-06-02: 07:00:00 1 mg via ORAL
  Filled 2021-06-02: qty 1

## 2021-06-02 NOTE — ED Notes (Signed)
Patient discharged.  Transport home with safe transport.

## 2021-06-02 NOTE — ED Provider Notes (Signed)
[facility] EMERGENCY DEPT Provider Note   CSN: [id number] Arrival date & time: 06/02/21  0600     History Chief Complaint  Patient presents with   Insomnia   Dehydration    Anthony Lamb is a 65 y.o. male.  The history is provided by the patient.  Insomnia He has history of depression, alcohol abuse, peptic ulcer disease and comes in stating that he has had no desire to eat or drink for the last 3 days and feels like he is dehydrated.  He does endorse an estimated 30 pound weight loss over the last month.  He is complaining of some pain in his left rib cage.  He has had some intermittent nausea and vomiting.  He denies fever, chills, sweats.  Denies any diarrhea.  He states that he is a non-smoker and is not drinking alcohol.  He does state that he has severe anxiety for which she takes lorazepam and severe insomnia for which he takes zolpidem.  He ran out of both of those 3 days ago stating that somebody must of taken the prescription medication from his apartment.  He is requesting supplies of both medications.   Past Medical History:  Diagnosis Date   Alcohol abuse, in remission    Anal fissure    Anemia    Ankylosing spondylitis (HCC)    Anxiety    Arthritis    Chronic diarrhea    Chronic headaches    Chronic pain    Colitis    Colon polyps    Depression    Esophagitis    Gastric polyps    hyperplastic and fundic gland   Gastritis    GERD (gastroesophageal reflux disease)    Hypertension    IBS (irritable bowel syndrome)    Poor dentition    SIADH (syndrome of inappropriate ADH production) ([place])     Patient Active Problem List   Diagnosis Date Noted   MDD (major depressive disorder), recurrent episode, with atypical features ([place]) 05/07/2021   Suicidal ideation 04/05/2021   MDD (major depressive disorder), recurrent episode, severe ([place]) 03/31/2021   Substance induced mood disorder ([place]) 03/31/2021   Family history of colon cancer 12/02/2018    Gastric polyps    Schatzki's ring    Left sided ulcerative colitis without complication ([place])    Odynophagia 11/25/2018   Dysphagia 11/25/2018   Major depressive disorder, recurrent severe without psychotic features ([place]) 06/24/2018   IBS (irritable bowel syndrome)    Hypertension    Colitis    Adjustment disorder with mixed anxiety and depressed mood 05/11/2017   Alcohol abuse    Anxiety    Gastroesophageal reflux disease    Osteopenia determined by x-ray 08/06/2014   Stress fracture of calcaneus 08/06/2014   Vitamin D deficiency 12/18/2013   Dementia ([place]) 12/18/2013   Benign microscopic hematuria 07/06/2013   SIADH (syndrome of inappropriate ADH production) ([place]) 06/22/2013   Ankylosing spondylitis ([place]) 06/20/2013   Malnutrition of moderate degree ([place]) 06/20/2013   BPH (benign prostatic hyperplasia) 08/22/2010   History of colonic polyps 08/13/2008   Essential hypertension 07/03/2007   PUD (peptic ulcer disease) 07/03/2007    Past Surgical History:  Procedure Laterality Date   BIOPSY  11/26/2018   Procedure: BIOPSY;  Surgeon: Gatha Mayer, MD;  [facility];  Service: Endoscopy;;   COLONOSCOPY W/ BIOPSIES     COLONOSCOPY WITH PROPOFOL N/A 11/26/2018   Procedure: COLONOSCOPY WITH PROPOFOL;  Surgeon: Gatha Mayer, MD;  Location: [facility];  Service: Endoscopy;  Laterality: N/A;   ESOPHAGOGASTRODUODENOSCOPY     ESOPHAGOGASTRODUODENOSCOPY (EGD) WITH PROPOFOL N/A 11/26/2018   Procedure: ESOPHAGOGASTRODUODENOSCOPY (EGD) WITH PROPOFOL;  Surgeon: Gatha Mayer, MD;  [facility];  Service: Endoscopy;  Laterality: N/A;   HEMOSTASIS CLIP PLACEMENT  11/26/2018   Procedure: HEMOSTASIS CLIP PLACEMENT;  Surgeon: Gatha Mayer, MD;  [facility];  Service: Endoscopy;;   HERNIA REPAIR Bilateral    HOT HEMOSTASIS N/A 11/26/2018   Procedure: HOT HEMOSTASIS (ARGON PLASMA COAGULATION/BICAP);  Surgeon: Gatha Mayer, MD;  Location[facility];   Service: Endoscopy;  Laterality: N/A;   OPEN REDUCTION INTERNAL FIXATION (ORIF) DISTAL RADIAL FRACTURE Right 02/11/2018   Procedure: OPEN REDUCTION INTERNAL FIXATION (ORIF) DISTAL RADIAL FRACTURE;  Surgeon: Milly Jakob, MD;  Location: [facility];  Service: Orthopedics;  Laterality: Right;   POLYPECTOMY  11/26/2018   Procedure: POLYPECTOMY;  Surgeon: Gatha Mayer, MD;  [facility];  Service: Endoscopy;;   TONSILLECTOMY         Family History  Problem Relation Age of Onset   Anxiety disorder Mother    Congestive Heart Failure Mother    Crohn's disease Mother    Colon cancer Mother 34   Arthritis Father    High blood pressure Father    Crohn's disease Father     Social History   Tobacco Use   Smoking status: Former    Types: Cigarettes    Quit date: 2014    Years since quitting: 8.9   Smokeless tobacco: Never  Vaping Use   Vaping Use: Never used  Substance Use Topics   Alcohol use: Not Currently   Drug use: Yes    Types: Marijuana    Home Medications Prior to Admission medications   Medication Sig Start Date End Date Taking? Authorizing Provider  diclofenac Sodium (VOLTAREN) 1 % GEL Apply 2 g topically 4 (four) times daily as needed (to painful sites). Patient not taking: Reported on 05/03/2021    [provider]  FLUoxetine (PROZAC) 20 MG capsule Take 1 capsule (20 mg total) by mouth daily. 05/21/21   Parks Ranger, DO  LORazepam (ATIVAN) 1 MG tablet Take 1 tablet (1 mg total) by mouth 3 (three) times daily. 05/20/21   Parks Ranger, DO  meloxicam (MOBIC) 15 MG tablet Take 15 mg by mouth daily. 05/05/21   [provider]  Multiple Vitamin (MULTIVITAMIN WITH MINERALS) TABS tablet Take 1 tablet by mouth daily. (May buy over the counter) Patient taking differently: Take 1 tablet by mouth daily. 06/28/18   Connye Burkitt, NP  pantoprazole (PROTONIX) 40 MG tablet Take 1 tablet (40 mg total) by mouth daily. Patient taking  differently: Take 40 mg by mouth in the morning. 04/24/14   Janith Lima, MD  propranolol (INDERAL) 10 MG tablet Take 1 tablet (10 mg total) by mouth 2 (two) times daily. 05/20/21   Parks Ranger, DO  QUEtiapine (SEROQUEL) 300 MG tablet Take 1 tablet (300 mg total) by mouth at bedtime. 05/20/21   Parks Ranger, DO  REXULTI 2 MG TABS tablet Take 2 mg by mouth daily. 05/05/21   [provider]  risperiDONE (RISPERDAL) 0.5 MG tablet Take 1 tablet (0.5 mg total) by mouth 2 (two) times daily. 05/20/21   Parks Ranger, DO  traZODone (DESYREL) 50 MG tablet Take 1 tablet (50 mg total) by mouth at bedtime. 05/20/21   Parks Ranger, DO  zolpidem (AMBIEN) 10 MG tablet  Take 1 tablet (10 mg total) by mouth at bedtime. 05/20/21   Parks Ranger, DO    Allergies    Nsaids, Diphenhydramine hcl, Flexeril [cyclobenzaprine], Hctz [hydrochlorothiazide], Rexulti [brexpiprazole], Seroquel [quetiapine], Gabapentin, and Hydroxyzine  Review of Systems   Review of Systems  Psychiatric/Behavioral:  The patient has insomnia.   All other systems reviewed and are negative.  Physical Exam Updated Vital Signs BP 110/63 (BP Location: Right Arm)    Pulse (!) 55    Temp 98.1 F (36.7 C) (Oral)    Resp 18    Ht 5' 10"  (1.778 m)    Wt 65.8 kg    SpO2 100%    BMI 20.81 kg/m   Physical Exam Vitals and nursing note reviewed.  65 year old male, resting comfortably and in no acute distress. Vital signs are significant for slightly slow heart rate. Oxygen saturation is 100%, which is normal. Head is normocephalic and atraumatic. PERRLA, EOMI. Oropharynx is clear.  Mucous membranes are dry. Neck is nontender and supple without adenopathy or JVD. Back is nontender and there is no CVA tenderness. Lungs are clear without rales, wheezes, or rhonchi. Chest is nontender. Heart has regular rate and rhythm without murmur. Abdomen is soft, flat, nontender without masses or  hepatosplenomegaly and peristalsis is hypoactive. Extremities have no cyanosis or edema, full range of motion is present. Skin is warm and dry without rash. Neurologic: Mental status is normal, cranial nerves are intact, moves all extremities equally.  ED Results / Procedures / Treatments   Labs (all labs ordered are listed, but only abnormal results are displayed) Labs Reviewed  COMPREHENSIVE METABOLIC PANEL  CBC WITH DIFFERENTIAL/PLATELET  URINALYSIS, ROUTINE W REFLEX MICROSCOPIC  RAPID URINE DRUG SCREEN, HOSP PERFORMED   Radiology No results found.  Procedures Procedures   Medications Ordered in ED Medications  lactated ringers bolus 1,000 mL (has no administration in time range)  ondansetron (ZOFRAN) injection 4 mg (has no administration in time range)  LORazepam (ATIVAN) tablet 1 mg (has no administration in time range)    ED Course  I have reviewed the triage vital signs and the nursing notes.  Pertinent labs & imaging results that were available during my care of the patient were reviewed by me and considered in my medical decision making (see chart for details).    MDM Rules/Calculators/A&P                         Probable dehydration.  He will be given IV fluids and will check screening labs.  He is given a dose of lorazepam here.  His record on the [place] controlled substance reporting website shows 1 month supply of lorazepam and zolpidem filled on December 16.  Old records are reviewed showing multiple ED visits for suicidal ideation.  He has had significant hyponatremia, will need to check sodium today.  Case is signed out to Dr. Tomi Bamberger.  Final Clinical Impression(s) / ED Diagnoses Final diagnoses:  Anxiety  Dehydration    Rx / DC Orders ED Discharge Orders     None        Delora Fuel, MD 49/67/59 [phone]

## 2021-06-02 NOTE — ED Notes (Signed)
Pt started to complain of CP, in the center of his chest. Doesn't radiate, rates it 7/10, started about 5-10 mins before EKG taken. Denied current N/V/D after the CP started. MD aware. EKG performed.

## 2021-06-02 NOTE — ED Provider Notes (Signed)
Clinical Course as of 06/02/21 0920  Thu Jun 02, 2021  0736 CBC shows slight elevation white blood cell count.  Chest x-ray negative for acute findings [JK]    Clinical Course User Index [JK] Dorie Rank, MD   Patient was initially seen by Dr. [name].  Please see his note.  ED work-up is reassuring.  EKG was unremarkable.  Metabolic panel does not show any significant hyponatremia.  No anemia.  No signs of pneumonia or pneumothorax on x-ray.  Patient does have history of anxiety and states he ran out of his medications.  He was given a dose of Ativan and requested 1 more dose prior to discharge.  He appears more comfortable now.  He is eating and drinking.  Suspect his symptoms are more likely related to some of his anxiety issues.  Recommended he contact his doctor's office today regarding his prescriptions.   Dorie Rank, MD 06/02/21 [phone]

## 2021-06-02 NOTE — Discharge Instructions (Signed)
Continue your current medications.  Contact your doctor's office about your prescriptions.

## 2021-06-02 NOTE — ED Triage Notes (Signed)
°  Patient comes in with insomnia and dehydration.  Patient states he is out of his ambien, ativan, and has not been able to sleep for several days.  Patient states his prescription was filled on 12/15 and thinks someone stole his medication.  Patient endorses headache and is A&O x4.  Pain 8/10.  Emesis on arrival.

## 2021-06-04 ENCOUNTER — Emergency Department (HOSPITAL_BASED_OUTPATIENT_CLINIC_OR_DEPARTMENT_OTHER): Payer: Medicare Other

## 2021-06-04 ENCOUNTER — Other Ambulatory Visit: Payer: Self-pay

## 2021-06-04 ENCOUNTER — Emergency Department (HOSPITAL_BASED_OUTPATIENT_CLINIC_OR_DEPARTMENT_OTHER)
Admission: EM | Admit: 2021-06-04 | Discharge: 2021-06-04 | Disposition: A | Discharge status: Home / Self Care | Payer: Medicare Other | Attending: Emergency Medicine | Admitting: Emergency Medicine

## 2021-06-04 DIAGNOSIS — Z87891 Personal history of nicotine dependence: Secondary | ICD-10-CM | POA: Insufficient documentation

## 2021-06-04 DIAGNOSIS — Z20822 Contact with and (suspected) exposure to covid-19: Secondary | ICD-10-CM | POA: Diagnosis not present

## 2021-06-04 DIAGNOSIS — Z79899 Other long term (current) drug therapy: Secondary | ICD-10-CM | POA: Diagnosis not present

## 2021-06-04 DIAGNOSIS — I10 Essential (primary) hypertension: Secondary | ICD-10-CM | POA: Diagnosis not present

## 2021-06-04 DIAGNOSIS — J029 Acute pharyngitis, unspecified: Secondary | ICD-10-CM | POA: Diagnosis not present

## 2021-06-04 DIAGNOSIS — R1011 Right upper quadrant pain: Secondary | ICD-10-CM | POA: Insufficient documentation

## 2021-06-04 DIAGNOSIS — F419 Anxiety disorder, unspecified: Secondary | ICD-10-CM | POA: Insufficient documentation

## 2021-06-04 LAB — URINALYSIS, ROUTINE W REFLEX MICROSCOPIC
Bilirubin Urine: NEGATIVE
Glucose, UA: NEGATIVE mg/dL
Hgb urine dipstick: NEGATIVE
Ketones, ur: NEGATIVE mg/dL
Leukocytes,Ua: NEGATIVE
Nitrite: NEGATIVE
Protein, ur: NEGATIVE mg/dL
Specific Gravity, Urine: 1.008 (ref 1.005–1.030)
pH: 7 (ref 5.0–8.0)

## 2021-06-04 LAB — COMPREHENSIVE METABOLIC PANEL
ALT: 11 U/L (ref 0–44)
AST: 16 U/L (ref 15–41)
Albumin: 4.7 g/dL (ref 3.5–5.0)
Alkaline Phosphatase: 81 U/L (ref 38–126)
Anion gap: 7 (ref 5–15)
BUN: <5 mg/dL — ABNORMAL LOW (ref 8–23)
CO2: 32 mmol/L (ref 22–32)
Calcium: 9.6 mg/dL (ref 8.9–10.3)
Chloride: 95 mmol/L — ABNORMAL LOW (ref 98–111)
Creatinine, Ser: 0.74 mg/dL (ref 0.61–1.24)
GFR, Estimated: >60 mL/min (ref >60)
Glucose, Bld: 104 mg/dL — ABNORMAL HIGH (ref 70–99)
Potassium: 3.9 mmol/L (ref 3.5–5.1)
Sodium: 134 mmol/L — ABNORMAL LOW (ref 135–145)
Total Bilirubin: 0.6 mg/dL (ref 0.3–1.2)
Total Protein: 7.7 g/dL (ref 6.5–8.1)

## 2021-06-04 LAB — CBC WITH DIFFERENTIAL/PLATELET
Abs Immature Granulocytes: 0.03 10*3/uL (ref 0.00–0.07)
Basophils Absolute: 0 10*3/uL (ref 0.0–0.1)
Basophils Relative: 0 %
Eosinophils Absolute: 0 10*3/uL (ref 0.0–0.5)
Eosinophils Relative: 0 %
HCT: 41.7 % (ref 39.0–52.0)
Hemoglobin: 14 g/dL (ref 13.0–17.0)
Immature Granulocytes: 0 %
Lymphocytes Relative: 13 %
Lymphs Abs: 1.3 10*3/uL (ref 0.7–4.0)
MCH: 31.4 pg (ref 26.0–34.0)
MCHC: 33.6 g/dL (ref 30.0–36.0)
MCV: 93.5 fL (ref 80.0–100.0)
Monocytes Absolute: 0.4 10*3/uL (ref 0.1–1.0)
Monocytes Relative: 5 %
Neutro Abs: 8 10*3/uL — ABNORMAL HIGH (ref 1.7–7.7)
Neutrophils Relative %: 82 %
Platelets: 347 10*3/uL (ref 150–400)
RBC: 4.46 MIL/uL (ref 4.22–5.81)
RDW: 12.9 % (ref 11.5–15.5)
WBC: 9.8 10*3/uL (ref 4.0–10.5)
nRBC: 0 % (ref 0.0–0.2)

## 2021-06-04 LAB — RESP PANEL BY RT-PCR (FLU A&B, COVID) ARPGX2
Influenza A by PCR: NEGATIVE
Influenza B by PCR: NEGATIVE
SARS Coronavirus 2 by RT PCR: NEGATIVE

## 2021-06-04 LAB — LACTIC ACID, PLASMA: Lactic Acid, Venous: 1.7 mmol/L (ref 0.5–1.9)

## 2021-06-04 LAB — LIPASE, BLOOD: Lipase: 14 U/L (ref 11–51)

## 2021-06-04 MED ORDER — LORAZEPAM 1 MG PO TABS
2.0000 mg | ORAL_TABLET | Freq: Once | ORAL | Status: AC
  Administered 2021-06-04: 2 mg via ORAL
  Filled 2021-06-04: qty 2

## 2021-06-04 MED ORDER — MORPHINE SULFATE (PF) 4 MG/ML IV SOLN
4.0000 mg | Freq: Once | INTRAVENOUS | Status: AC
  Administered 2021-06-04: 4 mg via INTRAVENOUS
  Filled 2021-06-04: qty 1

## 2021-06-04 MED ORDER — IOHEXOL 300 MG/ML  SOLN
100.0000 mL | Freq: Once | INTRAMUSCULAR | Status: AC | PRN
  Administered 2021-06-04: 100 mL via INTRAVENOUS

## 2021-06-04 MED ORDER — LORAZEPAM 2 MG/ML IJ SOLN
1.0000 mg | Freq: Once | INTRAMUSCULAR | Status: AC
  Administered 2021-06-04: 1 mg via INTRAVENOUS
  Filled 2021-06-04: qty 1

## 2021-06-04 MED ORDER — ONDANSETRON HCL 4 MG/2ML IJ SOLN
4.0000 mg | Freq: Once | INTRAMUSCULAR | Status: AC
  Administered 2021-06-04: 4 mg via INTRAVENOUS
  Filled 2021-06-04: qty 2

## 2021-06-04 MED ORDER — SODIUM CHLORIDE 0.9 % IV BOLUS
1000.0000 mL | Freq: Once | INTRAVENOUS | Status: AC
  Administered 2021-06-04: 1000 mL via INTRAVENOUS

## 2021-06-04 NOTE — ED Notes (Addendum)
First contact with patient. Patient arrived via triage from home with complaints of sore throat - n/v and RUQ pain x 4 days and states he is unable to sleep because of it. Patient states "I just feel like crap." Patient is A&OX 4. Respirations even/unlabored. Patient changed into gown and placed on cardiac monitor and call light within reach. Patient updated on plan of care. Will continue to monitor patient.   1059: noted heart rate drop at this time -EKG obtained and given to MD. Patient does not feel any different - no complaints of chest pain.  1128: Patient states he is feeling much better - awaiting results at this time.   1330: Patient requesting more ativan for nerves. MD made aware and states will be in shortly to assess patient. Report given to [employer]
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
## 2021-06-04 NOTE — Discharge Instructions (Signed)
Your history, exam, work-up today were overall reassuring.  We did not see any evidence of acute infection in your urine and your ultrasound not show acute cholecystitis.  Your labs are otherwise reassuring and similar to prior.  I suspect your mildly dehydrated for which we gave you fluids.  I suspect as you agreed, your anxiety was driving many of your symptoms.  Please follow-up with your primary doctor in the next few days to refill your home medications and follow-up with them.  Also recommend you following up with your GI team for your throat difficulties chronically which you said you are planning on getting a scope soon as well as with your urology team for your other GU troubles.  If any symptoms change or worsen acutely, please return to the nearest emergency department.

## 2021-06-04 NOTE — ED Triage Notes (Signed)
N/V, sore throat x 3 days. States that he has not been able to eat or drink x2 days.

## 2021-06-05 LAB — URINE CULTURE: Culture: NO GROWTH

## 2021-06-07 ENCOUNTER — Emergency Department (HOSPITAL_BASED_OUTPATIENT_CLINIC_OR_DEPARTMENT_OTHER)
Admission: EM | Admit: 2021-06-07 | Discharge: 2021-06-07 | Disposition: A | Discharge status: Home / Self Care | Payer: Medicaid Other | Attending: Emergency Medicine | Admitting: Emergency Medicine

## 2021-06-07 ENCOUNTER — Encounter (HOSPITAL_BASED_OUTPATIENT_CLINIC_OR_DEPARTMENT_OTHER): Payer: Self-pay

## 2021-06-07 ENCOUNTER — Other Ambulatory Visit: Payer: Self-pay

## 2021-06-07 DIAGNOSIS — Z8521 Personal history of malignant neoplasm of larynx: Secondary | ICD-10-CM | POA: Insufficient documentation

## 2021-06-07 DIAGNOSIS — F419 Anxiety disorder, unspecified: Secondary | ICD-10-CM | POA: Diagnosis present

## 2021-06-07 LAB — BASIC METABOLIC PANEL WITH GFR
Anion gap: 8 (ref 5–15)
BUN: 6 mg/dL — ABNORMAL LOW (ref 8–23)
CO2: 30 mmol/L (ref 22–32)
Calcium: 9.4 mg/dL (ref 8.9–10.3)
Chloride: 96 mmol/L — ABNORMAL LOW (ref 98–111)
Creatinine, Ser: 0.65 mg/dL (ref 0.61–1.24)
GFR, Estimated: >60 mL/min
Glucose, Bld: 89 mg/dL (ref 70–99)
Potassium: 3.7 mmol/L (ref 3.5–5.1)
Sodium: 134 mmol/L — ABNORMAL LOW (ref 135–145)

## 2021-06-07 LAB — CBC WITH DIFFERENTIAL/PLATELET
Abs Immature Granulocytes: 0.08 K/uL — ABNORMAL HIGH (ref 0.00–0.07)
Basophils Absolute: 0.1 K/uL (ref 0.0–0.1)
Basophils Relative: 1 %
Eosinophils Absolute: 0 K/uL (ref 0.0–0.5)
Eosinophils Relative: 1 %
HCT: 39.1 % (ref 39.0–52.0)
Hemoglobin: 13.4 g/dL (ref 13.0–17.0)
Immature Granulocytes: 1 %
Lymphocytes Relative: 25 %
Lymphs Abs: 2.1 K/uL (ref 0.7–4.0)
MCH: 31.5 pg (ref 26.0–34.0)
MCHC: 34.3 g/dL (ref 30.0–36.0)
MCV: 91.8 fL (ref 80.0–100.0)
Monocytes Absolute: 0.9 K/uL (ref 0.1–1.0)
Monocytes Relative: 11 %
Neutro Abs: 5 K/uL (ref 1.7–7.7)
Neutrophils Relative %: 61 %
Platelets: 306 K/uL (ref 150–400)
RBC: 4.26 MIL/uL (ref 4.22–5.81)
RDW: 12.9 % (ref 11.5–15.5)
WBC: 8.1 K/uL (ref 4.0–10.5)
nRBC: 0 % (ref 0.0–0.2)

## 2021-06-07 MED ORDER — ONDANSETRON HCL 4 MG/2ML IJ SOLN
4.0000 mg | Freq: Once | INTRAMUSCULAR | Status: AC
Start: 2021-06-07 — End: 2021-06-07
  Administered 2021-06-07: 4 mg via INTRAVENOUS
  Filled 2021-06-07: qty 2

## 2021-06-07 MED ORDER — LORAZEPAM 1 MG PO TABS
1.0000 mg | ORAL_TABLET | Freq: Once | ORAL | Status: AC
  Administered 2021-06-07: 1 mg via ORAL
  Filled 2021-06-07: qty 1

## 2021-06-07 MED ORDER — HYDROXYZINE HCL 25 MG PO TABS
25.0000 mg | ORAL_TABLET | Freq: Once | ORAL | Status: AC
  Administered 2021-06-07: 25 mg via ORAL
  Filled 2021-06-07: qty 1

## 2021-06-07 MED ORDER — KETOROLAC TROMETHAMINE 30 MG/ML IJ SOLN
30.0000 mg | Freq: Once | INTRAMUSCULAR | Status: AC
Start: 2021-06-07 — End: 2021-06-07
  Administered 2021-06-07: 30 mg via INTRAVENOUS
  Filled 2021-06-07: qty 1

## 2021-06-07 MED ORDER — SODIUM CHLORIDE 0.9 % IV BOLUS
1000.0000 mL | Freq: Once | INTRAVENOUS | Status: AC
Start: 2021-06-07 — End: 2021-06-07
  Administered 2021-06-07: 1000 mL via INTRAVENOUS

## 2021-06-07 NOTE — ED Triage Notes (Signed)
Upon further questioning pt states he has not been dx with CA, but states he thinks he has CA because he has been losing weight.
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
## 2021-06-07 NOTE — ED Notes (Signed)
EMT-P provided AVS using Teachback Method. Patient verbalizes understanding of Discharge Instructions. Opportunity for Questioning and Answers were provided by EMT-P. Patient Discharged from ED.  ? ?

## 2021-06-07 NOTE — Discharge Instructions (Signed)
Please follow-up with your primary care provider as well as a provider able to formally diagnose your reported throat cancer.
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
## 2021-06-09 ENCOUNTER — Encounter (HOSPITAL_BASED_OUTPATIENT_CLINIC_OR_DEPARTMENT_OTHER): Payer: Self-pay | Admitting: Emergency Medicine

## 2021-06-09 ENCOUNTER — Emergency Department (HOSPITAL_BASED_OUTPATIENT_CLINIC_OR_DEPARTMENT_OTHER)
Admission: EM | Admit: 2021-06-09 | Discharge: 2021-06-09 | Disposition: A | Discharge status: Home / Self Care | Payer: Medicaid Other | Attending: Emergency Medicine | Admitting: Emergency Medicine

## 2021-06-09 ENCOUNTER — Other Ambulatory Visit: Payer: Self-pay

## 2021-06-09 DIAGNOSIS — Z765 Malingerer [conscious simulation]: Secondary | ICD-10-CM | POA: Insufficient documentation

## 2021-06-09 DIAGNOSIS — Z85818 Personal history of malignant neoplasm of other sites of lip, oral cavity, and pharynx: Secondary | ICD-10-CM | POA: Diagnosis not present

## 2021-06-09 DIAGNOSIS — F419 Anxiety disorder, unspecified: Secondary | ICD-10-CM | POA: Insufficient documentation

## 2021-06-09 NOTE — ED Notes (Signed)
Patient verbalizes understanding of discharge instructions. Opportunity for questioning and answers were provided. Patient discharged from ED.  °

## 2021-06-09 NOTE — ED Triage Notes (Signed)
Pt arrives to ED with c/o anxiety and neck pain.

## 2021-06-09 NOTE — ED Notes (Addendum)
S1 S2 heard, Lungs sound slightly diminished but clear. Pulses and cap refill good. Pt's main complaint is aniexty along with sinus pressure, and some dizzness. Pt states he feel his problems stem from his anxiety.
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
## 2021-06-10 ENCOUNTER — Other Ambulatory Visit: Payer: Self-pay

## 2021-06-10 ENCOUNTER — Emergency Department (HOSPITAL_COMMUNITY)
Admission: EM | Admit: 2021-06-10 | Discharge: 2021-06-10 | Disposition: A | Discharge status: Home / Self Care | Payer: Medicare Other | Attending: Emergency Medicine | Admitting: Emergency Medicine

## 2021-06-10 ENCOUNTER — Encounter (HOSPITAL_COMMUNITY): Payer: Self-pay

## 2021-06-10 DIAGNOSIS — F419 Anxiety disorder, unspecified: Secondary | ICD-10-CM | POA: Insufficient documentation

## 2021-06-10 DIAGNOSIS — R1013 Epigastric pain: Secondary | ICD-10-CM | POA: Insufficient documentation

## 2021-06-10 LAB — I-STAT CHEM 8, ED
BUN: 4 mg/dL — ABNORMAL LOW (ref 8–23)
Calcium, Ion: 1.13 mmol/L — ABNORMAL LOW (ref 1.15–1.40)
Chloride: 95 mmol/L — ABNORMAL LOW (ref 98–111)
Creatinine, Ser: 0.6 mg/dL — ABNORMAL LOW (ref 0.61–1.24)
Glucose, Bld: 99 mg/dL (ref 70–99)
HCT: 43 % (ref 39.0–52.0)
Hemoglobin: 14.6 g/dL (ref 13.0–17.0)
Potassium: 3.4 mmol/L — ABNORMAL LOW (ref 3.5–5.1)
Sodium: 133 mmol/L — ABNORMAL LOW (ref 135–145)
TCO2: 26 mmol/L (ref 22–32)

## 2021-06-10 MED ORDER — LORAZEPAM 1 MG PO TABS: 1.0000 mg | ORAL_TABLET | Freq: Three times a day (TID) | ORAL | 0 refills | Status: DC | PRN

## 2021-06-10 MED ORDER — LORAZEPAM 2 MG/ML IJ SOLN
1.0000 mg | Freq: Once | INTRAMUSCULAR | Status: AC
  Administered 2021-06-10: 1 mg via INTRAVENOUS
  Filled 2021-06-10: qty 1

## 2021-06-10 MED ORDER — SODIUM CHLORIDE 0.9 % IV BOLUS
1000.0000 mL | Freq: Once | INTRAVENOUS | Status: AC
  Administered 2021-06-10: 1000 mL via INTRAVENOUS

## 2021-06-10 MED ORDER — DIAZEPAM 5 MG PO TABS: 5.0000 mg | ORAL_TABLET | Freq: Two times a day (BID) | ORAL | 0 refills | Status: DC

## 2021-06-10 NOTE — ED Triage Notes (Addendum)
Per EMS- Patient c/o nausea and anxiety. EMS states that the patient told them he knows he has stomach cancer, but has not been diagnosed.  Patient added that he is having upper abdominal pain that started yesterday. Patient states, "I can't eat any more."  All during triage, the patient kept saying he had multiple pains in his knees, neck, etc and kept coming up with a different complaint every minute including insomnia

## 2021-06-10 NOTE — ED Provider Notes (Signed)
[facility] DEPT Provider Note   CSN: [id number] Arrival date & time: 06/10/21  6010     History  Chief Complaint  Patient presents with   Nausea   Anxiety   Abdominal Pain    CADEN FUKUSHIMA is a 66 y.o. male.  HPI Patient presents for the 29th time in 6 months, about 1 week after his most recent visit.  He presents with concern for ongoing dyspepsia like symptoms, perseveration on malignancy.  He notes that since his evaluations he was scheduled for endoscopy, did not have the procedure done for unclear reasons.  He notes that he is no longer taking his Ativan as he ran out of this medication as well.  He has no new other pain, including chest pain or abdominal pain, no new syncope, he does note ongoing anorexia, though no postprandial vomiting, no diarrhea, no other abdominal pain.  He perseverates on being dehydrated, being anxious about his lack of endoscopy thus far, possibility of malignancy as above.  Additional details on chart review including CT scan, ultrasound from 1 week ago both essentially unremarkable    Home Medications Prior to Admission medications   Medication Sig Start Date End Date Taking? Authorizing Provider  diclofenac Sodium (VOLTAREN) 1 % GEL Apply 2 g topically 4 (four) times daily as needed (to painful sites). Patient not taking: Reported on 05/03/2021    [provider]  FLUoxetine (PROZAC) 20 MG capsule Take 1 capsule (20 mg total) by mouth daily. 05/21/21   Parks Ranger, DO  LORazepam (ATIVAN) 1 MG tablet Take 1 tablet (1 mg total) by mouth 3 (three) times daily. 05/20/21   Parks Ranger, DO  meloxicam (MOBIC) 15 MG tablet Take 15 mg by mouth daily. 05/05/21   [provider]  Multiple Vitamin (MULTIVITAMIN WITH MINERALS) TABS tablet Take 1 tablet by mouth daily. (May buy over the counter) Patient taking differently: Take 1 tablet by mouth daily. 06/28/18   Connye Burkitt, NP   pantoprazole (PROTONIX) 40 MG tablet Take 1 tablet (40 mg total) by mouth daily. Patient taking differently: Take 40 mg by mouth in the morning. 04/24/14   Janith Lima, MD  propranolol (INDERAL) 10 MG tablet Take 1 tablet (10 mg total) by mouth 2 (two) times daily. 05/20/21   Parks Ranger, DO  QUEtiapine (SEROQUEL) 300 MG tablet Take 1 tablet (300 mg total) by mouth at bedtime. 05/20/21   Parks Ranger, DO  REXULTI 2 MG TABS tablet Take 2 mg by mouth daily. 05/05/21   [provider]  risperiDONE (RISPERDAL) 0.5 MG tablet Take 1 tablet (0.5 mg total) by mouth 2 (two) times daily. 05/20/21   Parks Ranger, DO  traZODone (DESYREL) 50 MG tablet Take 1 tablet (50 mg total) by mouth at bedtime. 05/20/21   Parks Ranger, DO  zolpidem (AMBIEN) 10 MG tablet Take 1 tablet (10 mg total) by mouth at bedtime. 05/20/21   Parks Ranger, DO      Allergies    Nsaids, Diphenhydramine hcl, Flexeril [cyclobenzaprine], Hctz [hydrochlorothiazide], Rexulti [brexpiprazole], Seroquel [quetiapine], Gabapentin, and Hydroxyzine    Review of Systems   Review of Systems  Constitutional:        Per HPI, otherwise negative  HENT:         Per HPI, otherwise negative  Respiratory:         Per HPI, otherwise negative  Cardiovascular:        Per  HPI, otherwise negative  Gastrointestinal:  Negative for vomiting.  Endocrine:       Negative aside from HPI  Genitourinary:        Neg aside from HPI   Musculoskeletal:        Per HPI, otherwise negative  Skin: Negative.   Neurological:  Negative for syncope.  Psychiatric/Behavioral:  The patient is nervous/anxious.    Physical Exam Updated Vital Signs BP 138/72    Pulse 73    Temp 97.8 F (36.6 C) (Oral)    Resp 16    Ht 5' 10"  (1.778 m)    Wt 65.8 kg    SpO2 98%    BMI 20.81 kg/m  Physical Exam Vitals and nursing note reviewed.  Constitutional:      Appearance: He is not ill-appearing or diaphoretic.   HENT:     Head: Normocephalic and atraumatic.  Eyes:     Conjunctiva/sclera: Conjunctivae normal.  Cardiovascular:     Rate and Rhythm: Normal rate and regular rhythm.  Pulmonary:     Effort: Pulmonary effort is normal. No respiratory distress.     Breath sounds: No stridor.  Abdominal:     General: There is no distension.     Tenderness: There is no abdominal tenderness. There is no guarding.  Skin:    General: Skin is warm and dry.  Neurological:     Mental Status: He is alert and oriented to person, place, and time.  Psychiatric:        Mood and Affect: Mood is anxious.    ED Results / Procedures / Treatments   Labs (all labs ordered are listed, but only abnormal results are displayed) Labs Reviewed  I-STAT CHEM 8, ED - Abnormal; Notable for the following components:      Result Value   Sodium 133 (*)    Potassium 3.4 (*)    Chloride 95 (*)    BUN 4 (*)    Creatinine, Ser 0.60 (*)    Calcium, Ion 1.13 (*)    All other components within normal limits    EKG None  Radiology No results found.  Procedures Procedures    Medications Ordered in ED Medications  sodium chloride 0.9 % bolus 1,000 mL (has no administration in time range)  LORazepam (ATIVAN) injection 1 mg (1 mg Intravenous Given 06/10/21 1029)    ED Course/ Medical Decision Making/ A&P                           Medical Decision Making Broad differential including new abdominal infection, obstruction, electrolyte abnormalities, given his description of dehydration all considered.  Reviewed patient's chart including imaging results from last week.  CT scan, ultrasound both reviewed.  No evidence for acute processes on those, and initial vital signs are reassuring.  Patient received fluid resuscitation, though his electrolyte panel came back generally reassuring, BUN less than 4, normal creatinine, unremarkable mild abnormalities in potassium and sodium.  With generally reassuring recent radiographic  findings, indication is for endoscopy as an outpatient, as was previously.  Patient encouraged to pursue this.  Notably, the patient had appreciable anxiety, and given his 4 mg Ativan he was provided doses here and a short prescription to last until he sees his physician.  No indication for admission.  Final Clinical Impression(s) / ED Diagnoses Final diagnoses:  Epigastric pain    Rx / DC Orders ED Discharge Orders  Ordered    LORazepam (ATIVAN) 1 MG tablet  Every 8 hours PRN        06/10/21 1116              Carmin Muskrat, MD 06/10/21 1254

## 2021-06-10 NOTE — Discharge Instructions (Addendum)
It is very important that he follow-up with your GI doctor and have your endoscopy.  Equally important that you follow-up with your primary care physician to discuss your medications and the importance of ongoing appropriate outpatient management.  Return here for concerning changes in your condition.

## 2021-06-13 ENCOUNTER — Encounter (HOSPITAL_COMMUNITY): Payer: Self-pay

## 2021-06-13 ENCOUNTER — Other Ambulatory Visit: Payer: Self-pay

## 2021-06-13 ENCOUNTER — Emergency Department (HOSPITAL_COMMUNITY)
Admission: EM | Admit: 2021-06-13 | Discharge: 2021-06-13 | Disposition: A | Discharge status: Home / Self Care | Payer: Medicaid Other | Attending: Emergency Medicine | Admitting: Emergency Medicine

## 2021-06-13 DIAGNOSIS — F418 Other specified anxiety disorders: Secondary | ICD-10-CM | POA: Insufficient documentation

## 2021-06-13 DIAGNOSIS — Z79899 Other long term (current) drug therapy: Secondary | ICD-10-CM | POA: Insufficient documentation

## 2021-06-13 DIAGNOSIS — R825 Elevated urine levels of drugs, medicaments and biological substances: Secondary | ICD-10-CM | POA: Insufficient documentation

## 2021-06-13 DIAGNOSIS — R45851 Suicidal ideations: Secondary | ICD-10-CM | POA: Diagnosis not present

## 2021-06-13 LAB — COMPREHENSIVE METABOLIC PANEL
ALT: 10 U/L (ref 0–44)
AST: 15 U/L (ref 15–41)
Albumin: 4.3 g/dL (ref 3.5–5.0)
Alkaline Phosphatase: 65 U/L (ref 38–126)
Anion gap: 10 (ref 5–15)
BUN: 5 mg/dL — ABNORMAL LOW (ref 8–23)
CO2: 25 mmol/L (ref 22–32)
Calcium: 8.9 mg/dL (ref 8.9–10.3)
Chloride: 100 mmol/L (ref 98–111)
Creatinine, Ser: 0.55 mg/dL — ABNORMAL LOW (ref 0.61–1.24)
GFR, Estimated: >60 mL/min (ref >60)
Glucose, Bld: 98 mg/dL (ref 70–99)
Potassium: 3.7 mmol/L (ref 3.5–5.1)
Sodium: 135 mmol/L (ref 135–145)
Total Bilirubin: 0.6 mg/dL (ref 0.3–1.2)
Total Protein: 7 g/dL (ref 6.5–8.1)

## 2021-06-13 LAB — CBC WITH DIFFERENTIAL/PLATELET
Abs Immature Granulocytes: 0.02 10*3/uL (ref 0.00–0.07)
Basophils Absolute: 0.1 10*3/uL (ref 0.0–0.1)
Basophils Relative: 1 %
Eosinophils Absolute: 0 10*3/uL (ref 0.0–0.5)
Eosinophils Relative: 0 %
HCT: 40.7 % (ref 39.0–52.0)
Hemoglobin: 13.7 g/dL (ref 13.0–17.0)
Immature Granulocytes: 0 %
Lymphocytes Relative: 18 %
Lymphs Abs: 1.5 10*3/uL (ref 0.7–4.0)
MCH: 31.9 pg (ref 26.0–34.0)
MCHC: 33.7 g/dL (ref 30.0–36.0)
MCV: 94.9 fL (ref 80.0–100.0)
Monocytes Absolute: 0.7 10*3/uL (ref 0.1–1.0)
Monocytes Relative: 8 %
Neutro Abs: 6.2 10*3/uL (ref 1.7–7.7)
Neutrophils Relative %: 73 %
Platelets: 273 10*3/uL (ref 150–400)
RBC: 4.29 MIL/uL (ref 4.22–5.81)
RDW: 13.2 % (ref 11.5–15.5)
WBC: 8.5 10*3/uL (ref 4.0–10.5)
nRBC: 0 % (ref 0.0–0.2)

## 2021-06-13 LAB — ETHANOL: Alcohol, Ethyl (B): <10 mg/dL (ref <10)

## 2021-06-13 LAB — RAPID URINE DRUG SCREEN, HOSP PERFORMED
Amphetamines: NOT DETECTED
Barbiturates: NOT DETECTED
Benzodiazepines: POSITIVE — AB
Cocaine: NOT DETECTED
Opiates: NOT DETECTED
Tetrahydrocannabinol: NOT DETECTED

## 2021-06-13 MED ORDER — LORAZEPAM 1 MG PO TABS
1.0000 mg | ORAL_TABLET | Freq: Once | ORAL | Status: AC
  Administered 2021-06-13: 1 mg via ORAL
  Filled 2021-06-13: qty 1

## 2021-06-13 MED ORDER — ACETAMINOPHEN 325 MG PO TABS
650.0000 mg | ORAL_TABLET | Freq: Once | ORAL | Status: AC
  Administered 2021-06-13: 650 mg via ORAL
  Filled 2021-06-13: qty 2

## 2021-06-13 NOTE — ED Provider Notes (Signed)
[facility] DEPT Provider Note   CSN: [id number] Arrival date & time: 06/13/21  0940     History  Chief Complaint  Patient presents with   Suicidal    Anthony Lamb is a 66 y.o. male.  HPI  66 year old male presents the emergency department with concern for suicidal ideation and plan to kill himself.  Per EMS when they got to the patient's house he grabbed a knife in the kitchen and attempted to cut his wrist stating that he wanted to kill himself.  Patient states he has been having suicidal ideation wants to kill himself because he is so many health problems.  He plans to slit his wrists and has access to multiple knives.  Denies any homicidal ideation.  Denies any recent illness, fever.  Home Medications Prior to Admission medications   Medication Sig Start Date End Date Taking? Authorizing Provider  diazepam (VALIUM) 5 MG tablet Take 1 tablet (5 mg total) by mouth 2 (two) times daily. 06/10/21   Carmin Muskrat, MD  diclofenac Sodium (VOLTAREN) 1 % GEL Apply 2 g topically 4 (four) times daily as needed (to painful sites). Patient not taking: Reported on 05/03/2021    [provider]  FLUoxetine (PROZAC) 20 MG capsule Take 1 capsule (20 mg total) by mouth daily. 05/21/21   Parks Ranger, DO  lisinopril (ZESTRIL) 10 MG tablet Take 10 mg by mouth daily. 06/05/21   [provider]  meloxicam (MOBIC) 15 MG tablet Take 15 mg by mouth daily. 05/05/21   [provider]  Multiple Vitamin (MULTIVITAMIN WITH MINERALS) TABS tablet Take 1 tablet by mouth daily. (May buy over the counter) Patient taking differently: Take 1 tablet by mouth daily. 06/28/18   Connye Burkitt, NP  pantoprazole (PROTONIX) 40 MG tablet Take 1 tablet (40 mg total) by mouth daily. Patient taking differently: Take 40 mg by mouth in the morning. 04/24/14   Janith Lima, MD  propranolol (INDERAL) 10 MG tablet Take 1 tablet (10 mg total) by mouth 2 (two)  times daily. 05/20/21   Parks Ranger, DO  QUEtiapine (SEROQUEL) 300 MG tablet Take 1 tablet (300 mg total) by mouth at bedtime. 05/20/21   Parks Ranger, DO  REXULTI 2 MG TABS tablet Take 2 mg by mouth daily. 05/05/21   [provider]  risperiDONE (RISPERDAL) 0.5 MG tablet Take 1 tablet (0.5 mg total) by mouth 2 (two) times daily. 05/20/21   Parks Ranger, DO  sucralfate (CARAFATE) 1 g tablet Take 1 g by mouth 4 (four) times daily. 06/05/21   [provider]  tiZANidine (ZANAFLEX) 4 MG tablet Take 2 mg by mouth 3 (three) times daily as needed for pain. 06/05/21   [provider]  traZODone (DESYREL) 50 MG tablet Take 1 tablet (50 mg total) by mouth at bedtime. 05/20/21   Parks Ranger, DO  verapamil (CALAN-SR) 240 MG CR tablet Take 240 mg by mouth at bedtime. 06/07/21   [provider]  zolpidem (AMBIEN) 10 MG tablet Take 1 tablet (10 mg total) by mouth at bedtime. 05/20/21   Parks Ranger, DO      Allergies    Nsaids, Diphenhydramine hcl, Flexeril [cyclobenzaprine], Hctz [hydrochlorothiazide], Rexulti [brexpiprazole], Seroquel [quetiapine], Gabapentin, and Hydroxyzine    Review of Systems   Review of Systems  Constitutional:  Negative for fever.  Respiratory:  Negative for shortness of breath.   Cardiovascular:  Negative for chest pain.  Gastrointestinal:  Negative for abdominal distention.  Psychiatric/Behavioral:  Positive for agitation, self-injury, sleep disturbance and suicidal ideas. Negative for hallucinations. The patient is nervous/anxious.    Physical Exam Updated Vital Signs BP (!) 127/56 (BP Location: Left Arm)    Pulse 73    Temp 98.5 F (36.9 C) (Oral)    Resp 16    Ht 5' 10"  (1.778 m)    Wt 65.8 kg    SpO2 100%    BMI 20.81 kg/m  Physical Exam Vitals and nursing note reviewed.  Constitutional:      General: He is not in acute distress.    Appearance: Normal appearance. He is not  diaphoretic.  HENT:     Head: Normocephalic.     Mouth/Throat:     Mouth: Mucous membranes are moist.  Cardiovascular:     Rate and Rhythm: Normal rate.  Pulmonary:     Effort: Pulmonary effort is normal. No respiratory distress.  Musculoskeletal:        General: No deformity.  Skin:    General: Skin is warm.  Neurological:     Mental Status: He is alert and oriented to person, place, and time. Mental status is at baseline.  Psychiatric:        Mood and Affect: Mood normal.    ED Results / Procedures / Treatments   Labs (all labs ordered are listed, but only abnormal results are displayed) Labs Reviewed  COMPREHENSIVE METABOLIC PANEL - Abnormal; Notable for the following components:      Result Value   BUN 5 (*)    Creatinine, Ser 0.55 (*)    All other components within normal limits  RAPID URINE DRUG SCREEN, HOSP PERFORMED - Abnormal; Notable for the following components:   Benzodiazepines POSITIVE (*)    All other components within normal limits  CBC WITH DIFFERENTIAL/PLATELET  ETHANOL    EKG None  Radiology No results found.  Procedures Procedures    Medications Ordered in ED Medications  LORazepam (ATIVAN) tablet 1 mg (has no administration in time range)  LORazepam (ATIVAN) tablet 1 mg (1 mg Oral Given 06/13/21 1107)    ED Course/ Medical Decision Making/ A&P                           Medical Decision Making  66 year old male presents emergency department concern for SI.  While EMS was at the house the patient grabbed a knife and made a gesture to his wrist that he wanted to slit them.  He states that this is his plan to kill himself is to cut his wrists and bleed out.  Patient states he is depressed over his multiple medical conditions.  He denies any acute health complaint including fever, chest pain, shortness of breath.  Screening labs were done which are baseline.  Patient is requiring Ativan for anxiety.  Plan for TTS evaluation and  recommendations.  Patient has been medically cleared and placed under IVC.        Final Clinical Impression(s) / ED Diagnoses Final diagnoses:  None    Rx / DC Orders ED Discharge Orders     None         Lorelle Gibbs, DO 06/13/21 1418

## 2021-06-13 NOTE — Discharge Instructions (Signed)
Cleared by behavioral health for discharge home.  Follow-up as per behavioral health.  Involuntary commitment will be rescinded.

## 2021-06-13 NOTE — ED Triage Notes (Signed)
Per EMS- EMS states they were there to set up transportation for a GI consult. EMS states that the patient went to the kitchen and tried to grab a knife stating that he was going to kill himself and stated that when they got that knife from him he tried to grab a knife from the EMS person.

## 2021-06-13 NOTE — Progress Notes (Signed)
Transition of Care [facility]) - Emergency Department Mini Assessment   Patient Details  Name: Anthony Lamb MRN: [id number] Date of Birth: [birth year]  [facility] CM/SW Contact:    Illene Regulus, LCSW Phone Number: 06/13/2021, 1:40 PM   Clinical Narrative: TOC CSW spoke with pt's at bedside, pt is from home. Pt stated " I want to kill myself" pt stated " I plan to slit my wrist, with a knife" CSW inquired why pt may feel this way pt stated he thinks he will lose his apartment due to an inspection coming up. CSW inquired about the conditions of his apartment, pt stated his refrigerator is leaking. Pt stated he is also sick and that makes him not want to live, pt stated " I have not eaten anything because I have throat cancer". Pt stated he canceled his GI appointment stating " I didn't feel like going because I'm sick. " CSW explained the importance of following up with his outpatient providers, to feel better. CSW inquired about pt's visit today and tried to get pt to express his need, however, pt continues express SI. CSW made RN aware.     ED Mini Assessment: What brought you to the Emergency Department? : SI  Barriers to Discharge: Continued Medical Work up        Interventions which prevented an admission or readmission: Other (must enter comment) (unsafe home conditions)    Patient Contact and Communications        ,                 Admission diagnosis:  si Patient Active Problem List   Diagnosis Date Noted   MDD (major depressive disorder), recurrent episode, with atypical features ([place]) 05/07/2021   Suicidal ideation 04/05/2021   MDD (major depressive disorder), recurrent episode, severe ([place]) 03/31/2021   Substance induced mood disorder ([place]) 03/31/2021   Family history of colon cancer 12/02/2018   Gastric polyps    Schatzki's ring    Left sided ulcerative colitis without complication ([place])    Odynophagia 11/25/2018   Dysphagia  11/25/2018   Major depressive disorder, recurrent severe without psychotic features ([place]) 06/24/2018   IBS (irritable bowel syndrome)    Hypertension    Colitis    Adjustment disorder with mixed anxiety and depressed mood 05/11/2017   Alcohol abuse    Anxiety    Gastroesophageal reflux disease    Osteopenia determined by x-ray 08/06/2014   Stress fracture of calcaneus 08/06/2014   Vitamin D deficiency 12/18/2013   Dementia ([place]) 12/18/2013   Benign microscopic hematuria 07/06/2013   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/2013   Ankylosing spondylitis ([place]) 06/20/2013   Malnutrition of moderate degree ([place]) 06/20/2013   BPH (benign prostatic hyperplasia) 08/22/2010   History of colonic polyps 08/13/2008   Essential hypertension 07/03/2007   PUD (peptic ulcer disease) 07/03/2007   PCP:  Sandi Mariscal, MD Pharmacy:   [facility] [place], [place] - [address] [place] [address] Phone: [phone] Fax: [phone]  [facility] [place], [place] [place] [address] AT [address] [place] [address] Phone: [phone] Fax: [place], [place] [address] Phone: [phone] Fax: [phone]

## 2021-06-13 NOTE — ED Notes (Signed)
Patient denies plans to hurt himself-wants to go home

## 2021-06-13 NOTE — ED Provider Triage Note (Signed)
Emergency Medicine Provider Triage Evaluation Note  Anthony Lamb , a 66 y.o. male  was evaluated in triage.  Pt complains of suicidal thoughts, plans to cut himself.  Pt had a knife that EMS took away from him.  Pt reports he has cancer   Review of Systems  Positive: Weakness  Negative: fever  Physical Exam  BP (!) 127/56 (BP Location: Left Arm)    Pulse 73    Temp 98.5 F (36.9 C) (Oral)    Resp 16    SpO2 100%  Gen:   Awake, no distress . Resp:  Normal effort  MSK:   Moves extremities without difficulty  Other:    Medical Decision Making  Medically screening exam initiated at 10:27 AM.  Appropriate orders placed.  ANDREZ LIEURANCE was informed that the remainder of the evaluation will be completed by another provider, this initial triage assessment does not replace that evaluation, and the importance of remaining in the ED until their evaluation is complete.     Fransico Meadow, [place] 06/13/21 1028

## 2021-06-13 NOTE — Consult Note (Signed)
[facility] Psych ED Discharge  06/13/2021 4:56 PM Anthony Lamb  MRN:  [id number]  Method of visit?: Face to Face  Principal Problem: Suicidal ideation Discharge Diagnoses: Principal Problem:   Suicidal ideation    Subjective:  Today, patient is seen and assessed by this nurse practitioner, case discussed with Dr. Dwyane Dee. Patient states "I am not having a good day.  But I am feeling better now.  I am ready to go home.  I just need to call a cab and then I can go?"  Patient currently denies any suicidal ideations, suicidal thoughts, and or denying suicidal self-injurious behavior.  Patient denies homicidal ideations, violent behavior, aggression.  He denies any auditory or visual hallucinations.  He does not appear to be responding to internal stimuli, external stimuli, and or exhibiting delusional thought disorder.  Clinical assessment he does not appear to be experiencing mania, psychosis, paranoia, or otherwise any acute psychiatric symptoms that will warrant further evaluation.  Furthermore patient is able to contract for safety and is requesting discharge home. Chart review shows patient has recent visit 2 days ago with very similar presentation in which he endorsed suicidal ideations, staying overnight and denied suicidal ideations in the morning.  Patient does not appear to have any previous existing suicide attempts and or nonsuicidal self-injurious behaviors.  Patient will be psychiatrically cleared to discharge home with outpatient resources.  HPI: Anthony Lamb is a 66 y.o. male with a past medical history of alcohol misuse disorder, ankylosing spondylitis, hypertension, gastritis, esophagitis, and a recent diagnosis of throat cancer.  Patient states that he has been out of his "sleeping medications" for the past 3 days.  He says that when he does not take his sleeping medication he gets the shakes and he has been drinking more which has worsened to that.  He states that he feels that he does  not want a live anymore.  He is thought about slitting his wrists.  He has a history of suicidal ideation and substance induced mood disorder.    Total Time spent with patient: 30 minutes  Past Psychiatric History:   Past Medical History:  Past Medical History:  Diagnosis Date   Alcohol abuse, in remission    Anal fissure    Anemia    Ankylosing spondylitis (HCC)    Anxiety    Arthritis    Chronic diarrhea    Chronic headaches    Chronic pain    Colitis    Colon polyps    Depression    Esophagitis    Gastric polyps    hyperplastic and fundic gland   Gastritis    GERD (gastroesophageal reflux disease)    Hypertension    IBS (irritable bowel syndrome)    Poor dentition    SIADH (syndrome of inappropriate ADH production) ([place])     Past Surgical History:  Procedure Laterality Date   BIOPSY  11/26/2018   Procedure: BIOPSY;  Surgeon: Gatha Mayer, MD;  Location: [facility];  Service: Endoscopy;;   COLONOSCOPY W/ BIOPSIES     COLONOSCOPY WITH PROPOFOL N/A 11/26/2018   Procedure: COLONOSCOPY WITH PROPOFOL;  Surgeon: Gatha Mayer, MD;  Location: [facility];  Service: Endoscopy;  Laterality: N/A;   ESOPHAGOGASTRODUODENOSCOPY     ESOPHAGOGASTRODUODENOSCOPY (EGD) WITH PROPOFOL N/A 11/26/2018   Procedure: ESOPHAGOGASTRODUODENOSCOPY (EGD) WITH PROPOFOL;  Surgeon: Gatha Mayer, MD;  Location: [facility];  Service: Endoscopy;  Laterality: N/A;   HEMOSTASIS CLIP PLACEMENT  11/26/2018   Procedure: HEMOSTASIS CLIP  PLACEMENT;  Surgeon: Gatha Mayer, MD;  Location: [facility];  Service: Endoscopy;;   HERNIA REPAIR Bilateral    HOT HEMOSTASIS N/A 11/26/2018   Procedure: HOT HEMOSTASIS (ARGON PLASMA COAGULATION/BICAP);  Surgeon: Gatha Mayer, MD;  Location: [facility];  Service: Endoscopy;  Laterality: N/A;   OPEN REDUCTION INTERNAL FIXATION (ORIF) DISTAL RADIAL FRACTURE Right 02/11/2018   Procedure: OPEN REDUCTION INTERNAL FIXATION (ORIF) DISTAL RADIAL FRACTURE;  Surgeon:  Milly Jakob, MD;  Location: [place];  Service: Orthopedics;  Laterality: Right;   POLYPECTOMY  11/26/2018   Procedure: POLYPECTOMY;  Surgeon: Gatha Mayer, MD;  Location: [facility];  Service: Endoscopy;;   TONSILLECTOMY     Family History:  Family History  Problem Relation Age of Onset   Anxiety disorder Mother    Congestive Heart Failure Mother    Crohn's disease Mother    Colon cancer Mother 34   Arthritis Father    High blood pressure Father    Crohn's disease Father    Family Psychiatric  History:  Social History:  Social History   Substance and Sexual Activity  Alcohol Use Not Currently     Social History   Substance and Sexual Activity  Drug Use Not Currently   Types: Marijuana    Social History   Socioeconomic History   Marital status: Single    Spouse name: Not on file   Number of children: 0   Years of education: Not on file   Highest education level: Not on file  Occupational History   Not on file  Tobacco Use   Smoking status: Former    Types: Cigarettes    Quit date: 2014    Years since quitting: 9.0   Smokeless tobacco: Never  Vaping Use   Vaping Use: Never used  Substance and Sexual Activity   Alcohol use: Not Currently   Drug use: Not Currently    Types: Marijuana   Sexual activity: Not Currently  Other Topics Concern   Not on file  Social History Narrative   HSG. Long - term disability - unable to work. Lived with his mother in her house - she died 04-30-2023 -    Lives in apartment   Has case worker   Social Determinants of Radio broadcast assistant Strain: Not on file  Food Insecurity: Not on file  Transportation Needs: Not on file  Physical Activity: Not on file  Stress: Not on file  Social Connections: Not on file    Tobacco Cessation:  N/A, patient does not currently use tobacco products  Current Medications: No current facility-administered medications for this encounter.   Current Outpatient Medications   Medication Sig Dispense Refill   diazepam (VALIUM) 5 MG tablet Take 1 tablet (5 mg total) by mouth 2 (two) times daily. 10 tablet 0   diclofenac Sodium (VOLTAREN) 1 % GEL Apply 2 g topically 4 (four) times daily as needed (to painful sites).     FLUoxetine (PROZAC) 20 MG capsule Take 1 capsule (20 mg total) by mouth daily. 30 capsule 3   lisinopril (ZESTRIL) 10 MG tablet Take 10 mg by mouth daily.     meloxicam (MOBIC) 15 MG tablet Take 15 mg by mouth daily.     Multiple Vitamin (MULTIVITAMIN WITH MINERALS) TABS tablet Take 1 tablet by mouth daily. (May buy over the counter) (Patient taking differently: Take 1 tablet by mouth daily.) 1 tablet 0   pantoprazole (PROTONIX) 40 MG tablet Take 1 tablet (40 mg  total) by mouth daily. (Patient taking differently: Take 40 mg by mouth in the morning.) 30 tablet 11   propranolol (INDERAL) 10 MG tablet Take 1 tablet (10 mg total) by mouth 2 (two) times daily. 60 tablet 3   QUEtiapine (SEROQUEL) 300 MG tablet Take 1 tablet (300 mg total) by mouth at bedtime. 30 tablet 3   REXULTI 2 MG TABS tablet Take 2 mg by mouth daily.     risperiDONE (RISPERDAL) 0.5 MG tablet Take 1 tablet (0.5 mg total) by mouth 2 (two) times daily. 60 tablet 3   sucralfate (CARAFATE) 1 g tablet Take 1 g by mouth 4 (four) times daily.     tiZANidine (ZANAFLEX) 4 MG tablet Take 2 mg by mouth 3 (three) times daily as needed for pain.     traZODone (DESYREL) 50 MG tablet Take 1 tablet (50 mg total) by mouth at bedtime. 30 tablet 3   verapamil (CALAN-SR) 240 MG CR tablet Take 240 mg by mouth at bedtime.     zolpidem (AMBIEN) 10 MG tablet Take 1 tablet (10 mg total) by mouth at bedtime. 30 tablet 0   PTA Medications: (Not in a hospital admission)   Musculoskeletal: Strength & Muscle Tone: within normal limits Gait & Station: normal Patient leans: N/A  Psychiatric Specialty Exam:  Presentation  General Appearance: Casual  Eye Contact:Good  Speech:Clear and Coherent  Speech  Volume:Normal  Handedness:Right   Mood and Affect  Mood:Euthymic  Affect:Congruent; Appropriate   Thought Process  Thought Processes:Coherent; Goal Directed  Descriptions of Associations:Intact  Orientation:Full (Time, Place and Person)  Thought Content:Logical  History of Schizophrenia/Schizoaffective disorder:No  Duration of Psychotic Symptoms:Less than six months Hallucinations:No data recorded Ideas of Reference:None  Suicidal Thoughts:No data recorded Homicidal Thoughts:No data recorded  Sensorium  Memory:Immediate Good; Recent Good; Remote Good  Judgment:Good  Insight:Good   Executive Functions  Concentration:Good  Attention Span:Good  [place]  Language:Good   Psychomotor Activity  Psychomotor Activity:No data recorded  Assets  Assets:Communication Skills; Desire for Improvement; Financial Resources/Insurance; Housing; Leisure Time; Resilience   Sleep  Sleep:No data recorded   Physical Exam: Physical Exam Vitals and nursing note reviewed.  Constitutional:      Appearance: Normal appearance. He is normal weight.  HENT:     Head: Normocephalic.  Cardiovascular:     Rate and Rhythm: Normal rate.  Pulmonary:     Effort: Pulmonary effort is normal.  Skin:    General: Skin is warm and dry.  Neurological:     General: No focal deficit present.     Mental Status: He is alert and oriented to person, place, and time. Mental status is at baseline.  Psychiatric:        Attention and Perception: Attention normal. He does not perceive auditory or visual hallucinations.        Mood and Affect: Mood normal.        Speech: Speech normal.        Behavior: Behavior normal. Behavior is cooperative.        Thought Content: Thought content normal. Thought content is not paranoid or delusional. Thought content does not include homicidal or suicidal ideation. Thought content does not include homicidal or suicidal plan.         Judgment: Judgment normal.   Review of Systems  Constitutional:  Negative for chills and fever.  Cardiovascular:  Negative for chest pain.  Gastrointestinal:  Negative for abdominal pain.  Neurological:  Negative for weakness and  headaches.  Psychiatric/Behavioral:  Negative for depression, hallucinations, substance abuse and suicidal ideas. The patient is not nervous/anxious and does not have insomnia.   All other systems reviewed and are negative. Blood pressure (!) 127/56, pulse 73, temperature 98.5 F (36.9 C), temperature source Oral, resp. rate 16, height 5' 10"  (1.778 m), weight 65.8 kg, SpO2 100 %. Body mass index is 20.81 kg/m.   Demographic Factors:  Male, Age 53 or older, and Low socioeconomic status  Loss Factors: Decline in physical health  Historical Factors: NA  Risk Reduction Factors:   Religious beliefs about death  Continued Clinical Symptoms:  Chronic Pain  Cognitive Features That Contribute To Risk:  None    Suicide Risk:  Mild:  Suicidal ideation of limited frequency, intensity, duration, and specificity.  There are no identifiable plans, no associated intent, mild dysphoria and related symptoms, good self-control (both objective and subjective assessment), few other risk factors, and identifiable protective factors, including available and accessible social support.   Plan Of Care/Follow-up recommendations:  Other:  This patient does not meet criteria for inpatient psychiatric treatment. Safe for outpatient treatment with current providers. [employer] aware that this patient would benefit from an ACT Team and will pursue this for him.  Will place [facility] consult for assistance with transportation home.  Disposition: Psychiatrically cleared for discharge home. Patient agrees with this plan. Safety planning reviewed in detail.    Suella Broad, FNP 06/13/2021, 4:56 PM

## 2021-06-14 ENCOUNTER — Encounter (HOSPITAL_COMMUNITY): Payer: Self-pay | Admitting: Emergency Medicine

## 2021-06-14 ENCOUNTER — Emergency Department (HOSPITAL_COMMUNITY)
Admission: EM | Admit: 2021-06-14 | Discharge: 2021-06-15 | Disposition: A | Discharge status: Home / Self Care | Payer: Medicaid Other | Attending: Emergency Medicine | Admitting: Emergency Medicine

## 2021-06-14 DIAGNOSIS — R109 Unspecified abdominal pain: Secondary | ICD-10-CM | POA: Diagnosis not present

## 2021-06-14 DIAGNOSIS — F1310 Sedative, hypnotic or anxiolytic abuse, uncomplicated: Secondary | ICD-10-CM | POA: Insufficient documentation

## 2021-06-14 DIAGNOSIS — R45851 Suicidal ideations: Secondary | ICD-10-CM | POA: Insufficient documentation

## 2021-06-14 DIAGNOSIS — K219 Gastro-esophageal reflux disease without esophagitis: Secondary | ICD-10-CM | POA: Diagnosis not present

## 2021-06-14 DIAGNOSIS — F419 Anxiety disorder, unspecified: Secondary | ICD-10-CM | POA: Diagnosis present

## 2021-06-14 MED ORDER — LISINOPRIL 10 MG PO TABS
10.0000 mg | ORAL_TABLET | Freq: Every day | ORAL | Status: DC
  Administered 2021-06-14: 10 mg via ORAL
  Filled 2021-06-14: qty 1

## 2021-06-14 MED ORDER — ACETAMINOPHEN 325 MG PO TABS
650.0000 mg | ORAL_TABLET | Freq: Four times a day (QID) | ORAL | Status: DC | PRN
  Administered 2021-06-14: 650 mg via ORAL
  Filled 2021-06-14: qty 2

## 2021-06-14 MED ORDER — TRAZODONE HCL 100 MG PO TABS
50.0000 mg | ORAL_TABLET | Freq: Every day | ORAL | Status: DC
  Administered 2021-06-14: 50 mg via ORAL
  Filled 2021-06-14: qty 1

## 2021-06-14 MED ORDER — QUETIAPINE FUMARATE 300 MG PO TABS
300.0000 mg | ORAL_TABLET | Freq: Every day | ORAL | Status: DC
  Administered 2021-06-14: 300 mg via ORAL
  Filled 2021-06-14: qty 1

## 2021-06-14 MED ORDER — BREXPIPRAZOLE 2 MG PO TABS
2.0000 mg | ORAL_TABLET | Freq: Every day | ORAL | Status: DC
  Administered 2021-06-14: 2 mg via ORAL
  Filled 2021-06-14 (×2): qty 1

## 2021-06-14 MED ORDER — VERAPAMIL HCL ER 240 MG PO TBCR
240.0000 mg | EXTENDED_RELEASE_TABLET | Freq: Every day | ORAL | Status: DC
  Administered 2021-06-14: 240 mg via ORAL
  Filled 2021-06-14 (×2): qty 1

## 2021-06-14 MED ORDER — ADULT MULTIVITAMIN W/MINERALS CH
1.0000 | ORAL_TABLET | Freq: Every day | ORAL | Status: DC
  Administered 2021-06-14: 1 via ORAL
  Filled 2021-06-14: qty 1

## 2021-06-14 MED ORDER — PANTOPRAZOLE SODIUM 40 MG PO TBEC
40.0000 mg | DELAYED_RELEASE_TABLET | Freq: Every day | ORAL | Status: DC
  Administered 2021-06-14: 40 mg via ORAL
  Filled 2021-06-14: qty 1

## 2021-06-14 MED ORDER — MELOXICAM 15 MG PO TABS
15.0000 mg | ORAL_TABLET | Freq: Every day | ORAL | Status: DC
  Administered 2021-06-14: 15 mg via ORAL
  Filled 2021-06-14 (×2): qty 1

## 2021-06-14 MED ORDER — PROPRANOLOL HCL 20 MG PO TABS
10.0000 mg | ORAL_TABLET | Freq: Two times a day (BID) | ORAL | Status: DC
  Administered 2021-06-14 (×2): 10 mg via ORAL
  Filled 2021-06-14 (×2): qty 1

## 2021-06-14 NOTE — ED Notes (Signed)
Patient is med seeking-frequently asking for Ativan

## 2021-06-14 NOTE — ED Notes (Signed)
Patient provided with a snack per request

## 2021-06-14 NOTE — Progress Notes (Signed)
..  Transition of Care [facility]) - Emergency Department Mini Assessment   Patient Details  Name: Anthony Lamb MRN: [id number] Date of Birth: 1956-01-25  [facility] of [facility]) CM/SW Contact:    Illene Regulus, LCSW Phone Number: 06/14/2021, 2:08 PM   Clinical Narrative: Pt was brought to ED via [employer], pt was taken to his CST staff and voiced SI, and the staff member called the police. [employer] brought pt to ED for evaluation.  CSW spoke with pt at bedside, pt was here in ED on 06/13/21 with the same concerns. Pt stated, "I want to kill myself". [employer] is requesting pt to be evaluated.  CSW spoke with pt CST team member Kenn File with [facility] [phone]) [phone], he reported this is his first time speaking with pt. CSW inquired about enhance services that were recommended from prior psych evaluations. Mr. Tasia Catchings stated he just started in the position, he stated when he meets with the team to staff pt's case he will suggest pt to receive ACT services. CSW informed Mr. Tasia Catchings of pt's last admission.   06/15/2021 8:20 am CSW arranged for Melburn Popper pt to be transported home upon d/c. CSW encouraged pt to speak with his CST team when he feels he is in a crisis.   ED Mini Assessment:    Barriers to Discharge: ED Psych evaluation        Interventions which prevented an admission or readmission: Other (must enter comment) (spoke with [employer])    Patient Contact and Communications        ,                 Admission diagnosis:  SI Patient Active Problem List   Diagnosis Date Noted   MDD (major depressive disorder), recurrent episode, with atypical features ([place]) 05/07/2021   Suicidal ideation 04/05/2021   MDD (major depressive disorder), recurrent episode, severe ([place]) 03/31/2021   Substance induced mood disorder ([place]) 03/31/2021   Family history of colon cancer 12/02/2018   Gastric polyps    Schatzki's ring    Left sided ulcerative colitis without complication  ([place])    Odynophagia 11/25/2018   Dysphagia 11/25/2018   Major depressive disorder, recurrent severe without psychotic features ([place]) 06/24/2018   IBS (irritable bowel syndrome)    Hypertension    Colitis    Adjustment disorder with mixed anxiety and depressed mood 05/11/2017   Alcohol abuse    Anxiety    Gastroesophageal reflux disease    Osteopenia determined by x-ray 08/06/2014   Stress fracture of calcaneus 08/06/2014   Vitamin D deficiency 12/18/2013   Dementia ([place]) 12/18/2013   Benign microscopic hematuria 07/06/2013   SIADH (syndrome of inappropriate ADH production) ([place]) 06/22/2013   Ankylosing spondylitis ([place]) 06/20/2013   Malnutrition of moderate degree ([place]) 06/20/2013   BPH (benign prostatic hyperplasia) 08/22/2010   History of colonic polyps 08/13/2008   Essential hypertension 07/03/2007   PUD (peptic ulcer disease) 07/03/2007   PCP:  Sandi Mariscal, MD Pharmacy:   [facility] [place], [place] - [address] [place] [address] Phone: [phone] Fax: [phone]  [place], [place] [address] Phone: [phone] Fax: [phone]

## 2021-06-14 NOTE — ED Provider Notes (Signed)
[facility] DEPT Provider Note   CSN: [id number] Arrival date & time: 06/14/21  1137     History  Chief Complaint  Patient presents with   Abdominal Pain   Suicidal    Anthony Lamb is a 66 y.o. male.  Pt is a 66 yo wm with a hx of chronic suicidal ideation, anxiety, gerd, ibs, and chronic pain presents to the ED today with SI and anxiety.  Pt is well known to the ED for multiple visits to the ED for the same.  Pt was just here yesterday and was d/c home.  The police bring him in today because he called them.  Pt said he is out of his anxiety meds.      Home Medications Prior to Admission medications   Medication Sig Start Date End Date Taking? Authorizing Provider  diazepam (VALIUM) 5 MG tablet Take 1 tablet (5 mg total) by mouth 2 (two) times daily. 06/10/21  Yes Carmin Muskrat, MD  diclofenac Sodium (VOLTAREN) 1 % GEL Apply 2 g topically 4 (four) times daily as needed (to painful sites).   Yes [provider]  lisinopril (ZESTRIL) 10 MG tablet Take 10 mg by mouth at bedtime. 06/05/21  Yes [provider]  meloxicam (MOBIC) 15 MG tablet Take 15 mg by mouth daily. 05/05/21  Yes [provider]  pantoprazole (PROTONIX) 40 MG tablet Take 1 tablet (40 mg total) by mouth daily. Patient taking differently: Take 40 mg by mouth at bedtime. 04/24/14  Yes Janith Lima, MD  propranolol (INDERAL) 10 MG tablet Take 1 tablet (10 mg total) by mouth 2 (two) times daily. 05/20/21  Yes Parks Ranger, DO  REXULTI 2 MG TABS tablet Take 2 mg by mouth daily. 05/05/21  Yes [provider]  sucralfate (CARAFATE) 1 g tablet Take 1 g by mouth 4 (four) times daily. 06/05/21  Yes [provider]  tiZANidine (ZANAFLEX) 4 MG tablet Take 2 mg by mouth 3 (three) times daily as needed for pain. 06/05/21  Yes [provider]  verapamil (CALAN-SR) 240 MG CR tablet Take 240 mg by mouth at bedtime. 06/07/21  Yes [provider]  FLUoxetine (PROZAC) 20 MG capsule Take 1 capsule (20 mg total) by mouth daily. Patient not taking: Reported on 06/14/2021 05/21/21   Parks Ranger, DO  Multiple Vitamin (MULTIVITAMIN WITH MINERALS) TABS tablet Take 1 tablet by mouth daily. (May buy over the counter) Patient taking differently: Take 1 tablet by mouth daily. 06/28/18   Connye Burkitt, NP  QUEtiapine (SEROQUEL) 300 MG tablet Take 1 tablet (300 mg total) by mouth at bedtime. 05/20/21   Parks Ranger, DO  risperiDONE (RISPERDAL) 0.5 MG tablet Take 1 tablet (0.5 mg total) by mouth 2 (two) times daily. 05/20/21   Parks Ranger, DO  traZODone (DESYREL) 50 MG tablet Take 1 tablet (50 mg total) by mouth at bedtime. 05/20/21   Parks Ranger, DO  zolpidem (AMBIEN) 10 MG tablet Take 1 tablet (10 mg total) by mouth at bedtime. 05/20/21   Parks Ranger, DO      Allergies    Nsaids, Diphenhydramine hcl, Flexeril [cyclobenzaprine], Hctz [hydrochlorothiazide], Rexulti [brexpiprazole], Seroquel [quetiapine], Gabapentin, and Hydroxyzine    Review of Systems   Review of Systems  Psychiatric/Behavioral:  Positive for suicidal ideas. The patient is nervous/anxious.   All other systems reviewed and are negative.  Physical Exam Updated Vital Signs BP 131/72 (BP Location: Left Arm)  Pulse 98    Temp 98 F (36.7 C) (Oral)    Resp 18    SpO2 100%  Physical Exam Vitals and nursing note reviewed.  Constitutional:      Appearance: He is well-developed.  HENT:     Head: Normocephalic and atraumatic.     Mouth/Throat:     Mouth: Mucous membranes are dry.  Eyes:     Extraocular Movements: Extraocular movements intact.     Pupils: Pupils are equal, round, and reactive to light.  Cardiovascular:     Rate and Rhythm: Normal rate and regular rhythm.     Heart sounds: Normal heart sounds.  Pulmonary:     Effort: Pulmonary effort is normal.     Breath sounds: Normal breath sounds.   Abdominal:     General: Abdomen is flat. Bowel sounds are normal.     Palpations: Abdomen is soft.  Skin:    General: Skin is warm.     Capillary Refill: Capillary refill takes less than 2 seconds.  Neurological:     Mental Status: He is alert and oriented to person, place, and time.  Psychiatric:        Mood and Affect: Mood is anxious.    ED Results / Procedures / Treatments   Labs (all labs ordered are listed, but only abnormal results are displayed) Labs Reviewed - No data to display  EKG None  Radiology No results found.  Procedures Procedures    Medications Ordered in ED Medications - No data to display  ED Course/ Medical Decision Making/ A&P                           Medical Decision Making  Pt is chronically suicidal.  The police are requesting another TTS consult and SW consult.  SW can't do anything until he is "cleared" by psych.  I don't think we need labs today as he just had them yesterday.  He is medically clear for psych.  Psych recommends changing care plan to avoid Xanax.  Disposition pending psych eval.        Final Clinical Impression(s) / ED Diagnoses Final diagnoses:  Benzodiazepine abuse ([place])    Rx / DC Orders ED Discharge Orders     None         [name], MD 06/14/21 1530

## 2021-06-14 NOTE — ED Triage Notes (Signed)
Per EMS-coming from home-multiple complaints-states he wants treatment for his mental health

## 2021-06-14 NOTE — Discharge Instructions (Signed)
Patient needs to call [employer] to enroll in [place] [phone])

## 2021-06-14 NOTE — ED Notes (Signed)
Patient reports "my jaws wont work.  I can't eat."  Patient encouraged to wait for a short period of time and gives jaws a rest.  Patient currently eating a sandwich without difficulty

## 2021-06-15 NOTE — Progress Notes (Addendum)
Pt has been psych cleared per Priscille Loveless, NP. [facility] consult will be entered to assist with pt's discharge needs. CSW will remove from [facility] shift report.   Benjaman Kindler, MSW, LCSWA 06/15/2021 1:08 AM

## 2021-06-15 NOTE — ED Notes (Signed)
Patient resting quietly in bed at this time

## 2021-06-15 NOTE — ED Notes (Signed)
Patient resting with eyes closed. Respirations even and unlabored.

## 2021-06-15 NOTE — ED Notes (Signed)
Patient sleeping on stretcher in hallway at this time.  Respirations even and unlabored. Will continue to monitor

## 2021-06-18 ENCOUNTER — Encounter (HOSPITAL_BASED_OUTPATIENT_CLINIC_OR_DEPARTMENT_OTHER): Payer: Self-pay | Admitting: Emergency Medicine

## 2021-06-18 ENCOUNTER — Emergency Department (HOSPITAL_BASED_OUTPATIENT_CLINIC_OR_DEPARTMENT_OTHER)
Admission: EM | Admit: 2021-06-18 | Discharge: 2021-06-18 | Disposition: A | Discharge status: Home / Self Care | Payer: Medicare Other | Attending: Emergency Medicine | Admitting: Emergency Medicine

## 2021-06-18 ENCOUNTER — Emergency Department (HOSPITAL_BASED_OUTPATIENT_CLINIC_OR_DEPARTMENT_OTHER): Payer: Medicare Other

## 2021-06-18 ENCOUNTER — Other Ambulatory Visit: Payer: Self-pay

## 2021-06-18 DIAGNOSIS — R1111 Vomiting without nausea: Secondary | ICD-10-CM | POA: Diagnosis present

## 2021-06-18 DIAGNOSIS — Z79899 Other long term (current) drug therapy: Secondary | ICD-10-CM | POA: Diagnosis not present

## 2021-06-18 DIAGNOSIS — F419 Anxiety disorder, unspecified: Secondary | ICD-10-CM | POA: Diagnosis not present

## 2021-06-18 DIAGNOSIS — R1013 Epigastric pain: Secondary | ICD-10-CM | POA: Diagnosis not present

## 2021-06-18 LAB — COMPREHENSIVE METABOLIC PANEL
ALT: 9 U/L (ref 0–44)
AST: 15 U/L (ref 15–41)
Albumin: 4.2 g/dL (ref 3.5–5.0)
Alkaline Phosphatase: 69 U/L (ref 38–126)
Anion gap: 10 (ref 5–15)
BUN: <5 mg/dL — ABNORMAL LOW (ref 8–23)
CO2: 27 mmol/L (ref 22–32)
Calcium: 9.3 mg/dL (ref 8.9–10.3)
Chloride: 97 mmol/L — ABNORMAL LOW (ref 98–111)
Creatinine, Ser: 0.68 mg/dL (ref 0.61–1.24)
GFR, Estimated: >60 mL/min (ref >60)
Glucose, Bld: 91 mg/dL (ref 70–99)
Potassium: 3.8 mmol/L (ref 3.5–5.1)
Sodium: 134 mmol/L — ABNORMAL LOW (ref 135–145)
Total Bilirubin: 0.5 mg/dL (ref 0.3–1.2)
Total Protein: 7 g/dL (ref 6.5–8.1)

## 2021-06-18 LAB — CBC WITH DIFFERENTIAL/PLATELET
Abs Immature Granulocytes: 0 10*3/uL (ref 0.00–0.07)
Basophils Absolute: 0.1 10*3/uL (ref 0.0–0.1)
Basophils Relative: 1 %
Eosinophils Absolute: 0.1 10*3/uL (ref 0.0–0.5)
Eosinophils Relative: 1 %
HCT: 41 % (ref 39.0–52.0)
Hemoglobin: 13.8 g/dL (ref 13.0–17.0)
Immature Granulocytes: 0 %
Lymphocytes Relative: 22 %
Lymphs Abs: 1.1 10*3/uL (ref 0.7–4.0)
MCH: 30.9 pg (ref 26.0–34.0)
MCHC: 33.7 g/dL (ref 30.0–36.0)
MCV: 91.9 fL (ref 80.0–100.0)
Monocytes Absolute: 0.4 10*3/uL (ref 0.1–1.0)
Monocytes Relative: 9 %
Neutro Abs: 3.3 10*3/uL (ref 1.7–7.7)
Neutrophils Relative %: 67 %
Platelets: 267 10*3/uL (ref 150–400)
RBC: 4.46 MIL/uL (ref 4.22–5.81)
RDW: 12.9 % (ref 11.5–15.5)
WBC: 4.9 10*3/uL (ref 4.0–10.5)
nRBC: 0 % (ref 0.0–0.2)

## 2021-06-18 LAB — URINALYSIS, ROUTINE W REFLEX MICROSCOPIC
Bilirubin Urine: NEGATIVE
Glucose, UA: NEGATIVE mg/dL
Hgb urine dipstick: NEGATIVE
Ketones, ur: NEGATIVE mg/dL
Leukocytes,Ua: NEGATIVE
Nitrite: NEGATIVE
Protein, ur: NEGATIVE mg/dL
Specific Gravity, Urine: 1.005 (ref 1.005–1.030)
pH: 7.5 (ref 5.0–8.0)

## 2021-06-18 LAB — LIPASE, BLOOD: Lipase: 13 U/L (ref 11–51)

## 2021-06-18 MED ORDER — PANTOPRAZOLE SODIUM 40 MG IV SOLR
40.0000 mg | Freq: Once | INTRAVENOUS | Status: AC
  Administered 2021-06-18: 40 mg via INTRAVENOUS
  Filled 2021-06-18: qty 40

## 2021-06-18 MED ORDER — HYDROXYZINE HCL 25 MG PO TABS
25.0000 mg | ORAL_TABLET | Freq: Once | ORAL | Status: AC
  Administered 2021-06-18: 25 mg via ORAL
  Filled 2021-06-18: qty 1

## 2021-06-18 MED ORDER — LIDOCAINE VISCOUS HCL 2 % MT SOLN
15.0000 mL | Freq: Once | OROMUCOSAL | Status: AC
  Administered 2021-06-18: 15 mL via ORAL
  Filled 2021-06-18: qty 15

## 2021-06-18 MED ORDER — ACETAMINOPHEN 500 MG PO TABS
1000.0000 mg | ORAL_TABLET | Freq: Once | ORAL | Status: AC
Start: 2021-06-18 — End: 2021-06-18
  Administered 2021-06-18: 1000 mg via ORAL
  Filled 2021-06-18: qty 2

## 2021-06-18 MED ORDER — ALUM & MAG HYDROXIDE-SIMETH 200-200-20 MG/5ML PO SUSP
30.0000 mL | Freq: Once | ORAL | Status: AC
  Administered 2021-06-18: 30 mL via ORAL
  Filled 2021-06-18: qty 30

## 2021-06-18 MED ORDER — LACTATED RINGERS IV BOLUS
1000.0000 mL | Freq: Once | INTRAVENOUS | Status: AC
  Administered 2021-06-18: 1000 mL via INTRAVENOUS

## 2021-06-18 MED ORDER — ONDANSETRON HCL 4 MG/2ML IJ SOLN
4.0000 mg | Freq: Once | INTRAMUSCULAR | Status: AC
Start: 2021-06-18 — End: 2021-06-18
  Administered 2021-06-18: 4 mg via INTRAVENOUS
  Filled 2021-06-18: qty 2

## 2021-06-18 NOTE — ED Notes (Signed)
Dc instructions reviewed with patient. Patient voiced understanding. Dc with belongings.  °

## 2021-06-18 NOTE — ED Notes (Signed)
RT Note: Pt. given PB Crackers/Ginger ale per MD request.
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
## 2021-06-18 NOTE — Discharge Instructions (Signed)
All your blood work today is normal.  There is no sign of internal bleeding or urinary tract infections.  Your kidneys are good.  It will be important for you to follow-up with your regular doctor for the endoscopy and ongoing care.  You should call your mental health provider as well so they can help you with your anxiety.  Continue taking your sucralfate and antacid medication at home.

## 2021-06-20 ENCOUNTER — Encounter (HOSPITAL_BASED_OUTPATIENT_CLINIC_OR_DEPARTMENT_OTHER): Payer: Self-pay | Admitting: Emergency Medicine

## 2021-06-20 ENCOUNTER — Emergency Department (HOSPITAL_BASED_OUTPATIENT_CLINIC_OR_DEPARTMENT_OTHER)
Admission: EM | Admit: 2021-06-20 | Discharge: 2021-06-20 | Disposition: A | Discharge status: Home / Self Care | Payer: Medicare Other | Attending: Emergency Medicine | Admitting: Emergency Medicine

## 2021-06-20 DIAGNOSIS — R110 Nausea: Secondary | ICD-10-CM | POA: Insufficient documentation

## 2021-06-20 DIAGNOSIS — R519 Headache, unspecified: Secondary | ICD-10-CM | POA: Diagnosis present

## 2021-06-20 DIAGNOSIS — F419 Anxiety disorder, unspecified: Secondary | ICD-10-CM | POA: Insufficient documentation

## 2021-06-20 MED ORDER — HYDROXYZINE HCL 25 MG PO TABS
50.0000 mg | ORAL_TABLET | Freq: Once | ORAL | Status: AC
Start: 2021-06-20 — End: 2021-06-20
  Administered 2021-06-20: 50 mg via ORAL
  Filled 2021-06-20: qty 2

## 2021-06-20 MED ORDER — ONDANSETRON 4 MG PO TBDP
8.0000 mg | ORAL_TABLET | Freq: Once | ORAL | Status: AC
  Administered 2021-06-20: 8 mg via ORAL
  Filled 2021-06-20: qty 2

## 2021-06-20 NOTE — ED Provider Notes (Signed)
[facility] EMERGENCY DEPT Provider Note   CSN: [id number] Arrival date & time: 06/20/21  0759     History  Chief Complaint  Patient presents with   Headache    Anthony Lamb is a 66 y.o. male.  66 year old male well-known to me presents with worse anxiety.  States that he has a trouble sleeping and has had some nausea with possible vomiting.  Seen 2 days ago for similar symptoms.  Work-up there was negative.  States he needs to have Ativan.  Patient has a history of benzodiazepine addiction.  Denies SI or HI.  No abdominal or chest complaints.  Patient scheduled to see his physician tomorrow      Home Medications Prior to Admission medications   Medication Sig Start Date End Date Taking? Authorizing Provider  diazepam (VALIUM) 5 MG tablet Take 1 tablet (5 mg total) by mouth 2 (two) times daily. 06/10/21   Carmin Muskrat, MD  diclofenac Sodium (VOLTAREN) 1 % GEL Apply 2 g topically 4 (four) times daily as needed (to painful sites).    [provider]  FLUoxetine (PROZAC) 20 MG capsule Take 1 capsule (20 mg total) by mouth daily. Patient not taking: Reported on 06/14/2021 05/21/21   Parks Ranger, DO  lisinopril (ZESTRIL) 10 MG tablet Take 10 mg by mouth at bedtime. 06/05/21   [provider]  meloxicam (MOBIC) 15 MG tablet Take 15 mg by mouth daily. 05/05/21   [provider]  Multiple Vitamin (MULTIVITAMIN WITH MINERALS) TABS tablet Take 1 tablet by mouth daily. (May buy over the counter) Patient taking differently: Take 1 tablet by mouth daily. 06/28/18   Connye Burkitt, NP  pantoprazole (PROTONIX) 40 MG tablet Take 1 tablet (40 mg total) by mouth daily. Patient taking differently: Take 40 mg by mouth at bedtime. 04/24/14   Janith Lima, MD  propranolol (INDERAL) 10 MG tablet Take 1 tablet (10 mg total) by mouth 2 (two) times daily. 05/20/21   Parks Ranger, DO  QUEtiapine (SEROQUEL) 300 MG tablet Take 1 tablet (300  mg total) by mouth at bedtime. 05/20/21   Parks Ranger, DO  REXULTI 2 MG TABS tablet Take 2 mg by mouth daily. 05/05/21   [provider]  risperiDONE (RISPERDAL) 0.5 MG tablet Take 1 tablet (0.5 mg total) by mouth 2 (two) times daily. 05/20/21   Parks Ranger, DO  sucralfate (CARAFATE) 1 g tablet Take 1 g by mouth 4 (four) times daily. 06/05/21   [provider]  tiZANidine (ZANAFLEX) 4 MG tablet Take 2 mg by mouth 3 (three) times daily as needed for pain. 06/05/21   [provider]  traZODone (DESYREL) 50 MG tablet Take 1 tablet (50 mg total) by mouth at bedtime. 05/20/21   Parks Ranger, DO  verapamil (CALAN-SR) 240 MG CR tablet Take 240 mg by mouth at bedtime. 06/07/21   [provider]  zolpidem (AMBIEN) 10 MG tablet Take 1 tablet (10 mg total) by mouth at bedtime. 05/20/21   Parks Ranger, DO      Allergies    Nsaids, Diphenhydramine hcl, Flexeril [cyclobenzaprine], Hctz [hydrochlorothiazide], Rexulti [brexpiprazole], Seroquel [quetiapine], Gabapentin, and Hydroxyzine    Review of Systems   Review of Systems  All other systems reviewed and are negative.  Physical Exam Updated Vital Signs There were no vitals taken for this visit. Physical Exam Vitals and nursing note reviewed.  Constitutional:      General: He is not in acute  distress.    Appearance: Normal appearance. He is well-developed. He is not toxic-appearing.  HENT:     Head: Normocephalic and atraumatic.  Eyes:     General: Lids are normal.     Conjunctiva/sclera: Conjunctivae normal.     Pupils: Pupils are equal, round, and reactive to light.  Neck:     Thyroid: No thyroid mass.     Trachea: No tracheal deviation.  Cardiovascular:     Rate and Rhythm: Normal rate and regular rhythm.     Heart sounds: Normal heart sounds. No murmur heard.   No gallop.  Pulmonary:     Effort: Pulmonary effort is normal. No respiratory distress.     Breath  sounds: Normal breath sounds. No stridor. No decreased breath sounds, wheezing, rhonchi or rales.  Abdominal:     General: There is no distension.     Palpations: Abdomen is soft.     Tenderness: There is no abdominal tenderness. There is no rebound.  Musculoskeletal:        General: No tenderness. Normal range of motion.     Cervical back: Normal range of motion and neck supple.  Skin:    General: Skin is warm and dry.     Findings: No abrasion or rash.  Neurological:     Mental Status: He is alert and oriented to person, place, and time. Mental status is at baseline.     GCS: GCS eye subscore is 4. GCS verbal subscore is 5. GCS motor subscore is 6.     Cranial Nerves: No cranial nerve deficit.     Sensory: No sensory deficit.     Motor: Motor function is intact.  Psychiatric:        Attention and Perception: Attention normal.        Mood and Affect: Mood is anxious.        Speech: Speech is rapid and pressured.        Behavior: Behavior is hyperactive.    ED Results / Procedures / Treatments   Labs (all labs ordered are listed, but only abnormal results are displayed) Labs Reviewed - No data to display  EKG None  Radiology DG Chest [facility] 1 View  Result Date: 06/18/2021 CLINICAL DATA:  Generalized chest pain with nausea EXAM: PORTABLE CHEST 1 VIEW COMPARISON:  06/02/2021 FINDINGS: Artifact from EKG leads. Normal heart size and mediastinal contours. No acute infiltrate or edema. No effusion or pneumothorax. No acute osseous findings. IMPRESSION: No active disease. Electronically Signed   By: Jorje Guild M.D.   On: 06/18/2021 08:58    Procedures Procedures    Medications Ordered in ED Medications  ondansetron (ZOFRAN-ODT) disintegrating tablet 8 mg (has no administration in time range)  hydrOXYzine (ATARAX) tablet 50 mg (has no administration in time range)    ED Course/ Medical Decision Making/ A&P                           Medical Decision Making Is old records  reviewed extensively. Patient treated for his anxiety and feels better here.  Patient able to eat peanut butter and crackers and drink liquids without any issues.  Patient has appointment to see her physician tomorrow strongly encouraged to keep this appointment.  No indication for admission at this time        Final Clinical Impression(s) / ED Diagnoses Final diagnoses:  None    Rx / DC Orders ED Discharge Orders  None         Lacretia Leigh, MD 06/20/21 [phone]

## 2021-06-20 NOTE — ED Notes (Signed)
Taxi arrived. Pt D/C with a bag of soup and snacks and drinks. Provided details about doctors appointment tomorrow.

## 2021-06-20 NOTE — ED Triage Notes (Signed)
Pt arrives [facility] w/ c/o of h/A  since last night , feels dehydrated , ,spot of top of head that hurts , pt AAox4 ,

## 2021-06-20 NOTE — ED Notes (Signed)
Pt steady on feet while standing to use urinal at bedside.

## 2021-06-20 NOTE — ED Notes (Signed)
Pt provided snacks and soda. Tolerating well

## 2021-06-20 NOTE — ED Notes (Signed)
Pt tolerating taking pills and PO intake. During assessment pt states he is so anxious he cant stay still. Pt requesting more water and food. Nausea reported, No vomiting noted.

## 2021-06-20 NOTE — Discharge Instructions (Signed)
Keep your appointment with your doctor tomorrow

## 2021-06-22 ENCOUNTER — Encounter (HOSPITAL_COMMUNITY): Payer: Self-pay | Admitting: Emergency Medicine

## 2021-06-22 ENCOUNTER — Other Ambulatory Visit: Payer: Self-pay

## 2021-06-22 ENCOUNTER — Emergency Department (HOSPITAL_COMMUNITY)
Admission: EM | Admit: 2021-06-22 | Discharge: 2021-06-23 | Disposition: A | Discharge status: Home / Self Care | Payer: Medicaid Other | Attending: Emergency Medicine | Admitting: Emergency Medicine

## 2021-06-22 DIAGNOSIS — R45851 Suicidal ideations: Secondary | ICD-10-CM | POA: Insufficient documentation

## 2021-06-22 DIAGNOSIS — F419 Anxiety disorder, unspecified: Secondary | ICD-10-CM | POA: Diagnosis not present

## 2021-06-22 MED ORDER — ACETAMINOPHEN 325 MG PO TABS
650.0000 mg | ORAL_TABLET | Freq: Once | ORAL | Status: AC
  Administered 2021-06-22: 650 mg via ORAL
  Filled 2021-06-22: qty 2

## 2021-06-22 MED ORDER — MELATONIN 3 MG PO TABS
3.0000 mg | ORAL_TABLET | Freq: Every day | ORAL | Status: DC
  Administered 2021-06-22: 3 mg via ORAL
  Filled 2021-06-22: qty 1

## 2021-06-22 MED ORDER — LISINOPRIL 10 MG PO TABS
10.0000 mg | ORAL_TABLET | Freq: Every day | ORAL | Status: DC
  Administered 2021-06-22: 10 mg via ORAL
  Filled 2021-06-22: qty 1

## 2021-06-22 MED ORDER — PANTOPRAZOLE SODIUM 40 MG PO TBEC
40.0000 mg | DELAYED_RELEASE_TABLET | Freq: Every day | ORAL | Status: DC
  Administered 2021-06-22: 40 mg via ORAL
  Filled 2021-06-22: qty 1

## 2021-06-22 MED ORDER — ADULT MULTIVITAMIN W/MINERALS CH
1.0000 | ORAL_TABLET | Freq: Every day | ORAL | Status: DC
  Administered 2021-06-22 – 2021-06-23 (×2): 1 via ORAL
  Filled 2021-06-22 (×2): qty 1

## 2021-06-22 MED ORDER — VERAPAMIL HCL ER 240 MG PO TBCR
240.0000 mg | EXTENDED_RELEASE_TABLET | Freq: Every day | ORAL | Status: DC
  Administered 2021-06-22: 240 mg via ORAL
  Filled 2021-06-22: qty 1

## 2021-06-22 NOTE — ED Triage Notes (Signed)
Pt BIB EMS from home, c/o pain in multiple areas. He is reportedly "about to lose his home." Says he wants to kill himself because he cant take it anymore. Pt is very anxious, alert and oriented x4

## 2021-06-22 NOTE — ED Notes (Signed)
MD at bedside. 

## 2021-06-22 NOTE — ED Notes (Signed)
Writer approached pt to discharge him back home. He said "Im going to kill myself when I get home, yall better not let me go, I have no place to go."

## 2021-06-22 NOTE — Progress Notes (Signed)
.  Transition of Care [facility]) - Emergency Department Mini Assessment   Patient Details  Name: Anthony Lamb MRN: [id number] Date of Birth: January 29, 1956  Transition of Care [facility]) CM/SW Contact:    Illene Regulus, LCSW Phone Number: 06/22/2021, 9:14 AM   Clinical Narrative:  CSW received consult for pt, pt is here every week with the same concerns, pt is very anxious at times with SI. Pt comes to ED seeking Ativan at times. Pt has prescription however pt does not take as prescribed. Pt has an CST team that follows him in the community. Pt has no SW needs at this time. TOC CSW sign off.  ED Mini Assessment: What brought you to the Emergency Department? : SI  Barriers to Discharge: No Barriers Identified             Patient Contact and Communications        ,                 Admission diagnosis:  Suicidal Ideation Patient Active Problem List   Diagnosis Date Noted   MDD (major depressive disorder), recurrent episode, with atypical features ([place]) 05/07/2021   Suicidal ideation 04/05/2021   MDD (major depressive disorder), recurrent episode, severe ([place]) 03/31/2021   Substance induced mood disorder ([employer]) 03/31/2021   Family history of colon cancer 12/02/2018   Gastric polyps    Schatzki's ring    Left sided ulcerative colitis without complication ([place])    Odynophagia 11/25/2018   Dysphagia 11/25/2018   Major depressive disorder, recurrent severe without psychotic features ([place]) 06/24/2018   IBS (irritable bowel syndrome)    Hypertension    Colitis    Adjustment disorder with mixed anxiety and depressed mood 05/11/2017   Alcohol abuse    Anxiety    Gastroesophageal reflux disease    Osteopenia determined by x-ray 08/06/2014   Stress fracture of calcaneus 08/06/2014   Vitamin D deficiency 12/18/2013   Dementia ([place]) 12/18/2013   Benign microscopic hematuria 07/06/2013   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

## 2021-06-22 NOTE — Progress Notes (Signed)
TOC CSW spoke with pt who reported he may be losing his apartment. Pt was seen to be very anxious expressing SI.   CSW reached out to DSS supervisor Dorthy Cooler to inquire about pt's special assistance funds. She reported pt is not a ward of the state and he would need to reach out to his Insurance account manager for more information. CSW provided contact information and requested someone from medicaid to reach out to this Probation officer.   CSW received e-mail concerns pt's special assistance:     *Caution - External email - see footer for warnings* Hi Margreta Journey,  See email from [employer] below.  Mr. Bonnette needs to reach out to Ms. Hamilton at [facility] to complete the necessary paperwork to reapply.  Thanks.      [place]       [employer]  [address] [place], [place] [address]  [phone]     f: [phone]    [email or web address]   [email or web address]                           From: Carlyle Basques <[email or web address]>  Sent: Wednesday, June 22, 2021 1:22 PM To: Dorthy Cooler <[email or web address]> Cc: Yolanda Headen <[email or web address]> Subject: RE: Customer Call  Good Afternoon, I am sorry to hear about Tildon Silveria anxiety issue. I spoke with him twice yesterday. The Request for Information ([place]) was mailed to him residence on 05/06/2021. And the Supplement I Transmittal was email to Case Coordinator Cory Munch from [facility] on the same date. The Notice of Modification, Termination, or Continuation of Public Assistance Form (DSS [phone]) was mailed to Mr. Joubert on 05/19/2021 and emailed to [place] from [place]. His recertification case due date was 06/04/2021. The DSS 8110 was mailed to inform Mr Quam about termination of SA benefits on 06/04/2021. The [place] and DSS 8110 states the documents required to complete the  recert. Mr. Bonser was informed yesterday that his SA case was closed on 06/04/2021. Because the re-enrollment form and Supplement II/ Appendix E transmittal was not received the complete the review. Mr. Sweeden was informed to reapply for SA benefits in person at local DSS. I  gave him contact information of Care Coordinator Pamala Hurry [place] (office and mobile numbers). Also, I have an email from [place] dated 05/19/2021. It states that his provider will get him to sign re-enrollment form and she will get the Supplement II form to me. Documents were not received by the case due date.  Sincerely,   Carlyle Basques Adult [facility] / [address] [place], [place] [address] FAX# [phone] Phone # [phone] Email: [email or web address]    Carlyle Basques      Eligibility Caseworker     Social Services  [phone]    [email or web address]        From: Dorthy Cooler <[email or web address]>  Sent: Wednesday, June 22, 2021 12:44 PM To: Carlyle Basques <[email or web address]> Cc: Yolanda Headen <[email or web address]> Subject: RE: Customer Call    Hello Mateo Flow,  I received a call from Ms. Kirk Ruths, Social Worker at [facility] (Psychiatric Unit).  She has a patient that was admitted due to anxiety because he received a notification from [place] about his Special Assistance being terminated.  Mr. Seoane stated he spoke to you but is not sure what is needed to keep him remain on the program.  Ms. Leafy Ro will assist him prior to him  leaving the hospital.  She has patient's consent to speak to DSS.  He is also confused, so if you are not the person that can provide assistance, please refer me to the caseworker handling his case.  His information: Darold Miley; DOB: 11-12-2055.  Ms. Orland Dec info: [phone] or email: [email or web address] .   Thank you, Karle Plumber [place]  [phone]     f: [phone]    [email or web address]            E-mail correspondence to and from this address may be subject to the [employer] and may be disclosed to third parties by an authorized [employer]. If you have received this communication in error , please do not distribute it. Please notify the sender by E-mail at the address shown and delete the original message.  WARNING: This email originated outside of [facility]. Even if this looks like a [employer], it is not. Do not provide your username, password, or any other personal information in response to this or any other email. [facility] will never ask you for your username or password via email. DO NOT CLICK links or attachments unless you are positive the content is safe. If in doubt about the safety of this message, select the [employer] button, which forwards to IT Security.   Arlie Solomons.Johanne Mcglade, MSW, [place]   Transitions of Care Clinical Social Worker I Direct Dial: [phone]   Fax: [phone] [email or web address]

## 2021-06-22 NOTE — ED Provider Notes (Signed)
[facility] DEPT Provider Note   CSN: [id number] Arrival date & time: 06/22/21  8938     History  Chief Complaint  Patient presents with   Anxiety    Anthony Lamb is a 66 y.o. male.  Patient presents to ER again with a complaint of anxiety and concern for losing his house.  He has reportedly been concerned about losing his house for the past year.  He told the nursing staff that he has thoughts of killing himself, because he "cannot take it anymore."  However when I speak with him about any thoughts of self-harm he just repeats that he is worried about losing his home.  Denies any active suicidal thoughts or plan at this time.  Patient also complains of feeling dehydrated.  No reports of any vomiting or diarrhea.  No fever no cough reported.      Home Medications Prior to Admission medications   Medication Sig Start Date End Date Taking? Authorizing Provider  diazepam (VALIUM) 5 MG tablet Take 1 tablet (5 mg total) by mouth 2 (two) times daily. 06/10/21   Carmin Muskrat, MD  diclofenac Sodium (VOLTAREN) 1 % GEL Apply 2 g topically 4 (four) times daily as needed (to painful sites).    [provider]  FLUoxetine (PROZAC) 20 MG capsule Take 1 capsule (20 mg total) by mouth daily. Patient not taking: Reported on 06/14/2021 05/21/21   Parks Ranger, DO  lisinopril (ZESTRIL) 10 MG tablet Take 10 mg by mouth at bedtime. 06/05/21   [provider]  meloxicam (MOBIC) 15 MG tablet Take 15 mg by mouth daily. 05/05/21   [provider]  Multiple Vitamin (MULTIVITAMIN WITH MINERALS) TABS tablet Take 1 tablet by mouth daily. (May buy over the counter) Patient taking differently: Take 1 tablet by mouth daily. 06/28/18   Connye Burkitt, NP  pantoprazole (PROTONIX) 40 MG tablet Take 1 tablet (40 mg total) by mouth daily. Patient taking differently: Take 40 mg by mouth at bedtime. 04/24/14   Janith Lima, MD  propranolol  (INDERAL) 10 MG tablet Take 1 tablet (10 mg total) by mouth 2 (two) times daily. 05/20/21   Parks Ranger, DO  QUEtiapine (SEROQUEL) 300 MG tablet Take 1 tablet (300 mg total) by mouth at bedtime. 05/20/21   Parks Ranger, DO  REXULTI 2 MG TABS tablet Take 2 mg by mouth daily. 05/05/21   [provider]  risperiDONE (RISPERDAL) 0.5 MG tablet Take 1 tablet (0.5 mg total) by mouth 2 (two) times daily. 05/20/21   Parks Ranger, DO  sucralfate (CARAFATE) 1 g tablet Take 1 g by mouth 4 (four) times daily. 06/05/21   [provider]  tiZANidine (ZANAFLEX) 4 MG tablet Take 2 mg by mouth 3 (three) times daily as needed for pain. 06/05/21   [provider]  traZODone (DESYREL) 50 MG tablet Take 1 tablet (50 mg total) by mouth at bedtime. 05/20/21   Parks Ranger, DO  verapamil (CALAN-SR) 240 MG CR tablet Take 240 mg by mouth at bedtime. 06/07/21   [provider]  zolpidem (AMBIEN) 10 MG tablet Take 1 tablet (10 mg total) by mouth at bedtime. 05/20/21   Parks Ranger, DO      Allergies    Nsaids, Diphenhydramine hcl, Flexeril [cyclobenzaprine], Hctz [hydrochlorothiazide], Rexulti [brexpiprazole], Seroquel [quetiapine], Gabapentin, and Hydroxyzine    Review of Systems   Review of Systems  Constitutional:  Negative for fever.  HENT:  Negative for ear pain and sore throat.   Eyes:  Negative for pain.  Respiratory:  Negative for cough.   Cardiovascular:  Negative for chest pain.  Gastrointestinal:  Negative for abdominal pain.  Genitourinary:  Negative for flank pain.  Musculoskeletal:  Negative for back pain.  Skin:  Negative for color change and rash.  Neurological:  Negative for syncope.  All other systems reviewed and are negative.  Physical Exam Updated Vital Signs BP 115/68 (BP Location: Left Arm)    Pulse 69    Temp 98.1 F (36.7 C) (Oral)    Resp 19    Ht 5' 10"  (1.778 m)    Wt 65.8 kg    SpO2 99%    BMI 20.80  kg/m  Physical Exam Constitutional:      Appearance: He is well-developed.  HENT:     Head: Normocephalic.     Nose: Nose normal.  Eyes:     Extraocular Movements: Extraocular movements intact.  Cardiovascular:     Rate and Rhythm: Normal rate.  Pulmonary:     Effort: Pulmonary effort is normal.  Skin:    Coloration: Skin is not jaundiced.  Neurological:     Mental Status: He is alert. Mental status is at baseline.    ED Results / Procedures / Treatments   Labs (all labs ordered are listed, but only abnormal results are displayed) Labs Reviewed - No data to display  EKG None  Radiology No results found.  Procedures Procedures    Medications Ordered in ED Medications - No data to display  ED Course/ Medical Decision Making/ A&P                           Medical Decision Making  Patient presents with recurrent anxiety and complaints of feeling dehydrated.  He did mention thoughts of self-harm to the nurse, however has not reported any such plan to me.  Review of records show multiple prior visits for similar complaints of feeling dehydrated, concern for losing his home, and thoughts of self-harm.  Patient is tolerating oral intake here drinking water and eating crackers, no vomiting noted.  Vital signs are within normal limits.  Given his multiple prior negative work-ups, stable vital signs and continued unchanged complaints, I feel he is appropriate for continued outpatient management.  Advising continued outpatient follow-up with his psychiatrist and primary care doctor this week.        Final Clinical Impression(s) / ED Diagnoses Final diagnoses:  Anxiety    Rx / DC Orders ED Discharge Orders     None         Luna Fuse, MD 06/22/21 1013

## 2021-06-22 NOTE — Discharge Instructions (Signed)
Call your primary care doctor or specialist as discussed in the next 2-3 days.   Return immediately back to the ER if:  Your symptoms worsen within the next 12-24 hours. You develop new symptoms such as new fevers, persistent vomiting, new pain, shortness of breath, or new weakness or numbness, or if you have any other concerns.  

## 2021-06-23 NOTE — Progress Notes (Signed)
TOC CSW spoke with pt's [employer] CST team staff Kenn File , he stated he has tried to see pt however pt tell him no he is going to the hospital when he asked to see him. CSW informed Mr Tasia Catchings pt is needing assistance with contacting his [place] and he is being discharged from the hospital today. He stated understanding and said he will follow up with pt at home.   Arlie Solomons.Narayan Scull, MSW, [place]   Transitions of Care Clinical Social Worker I Direct Dial: [phone]   Fax: [phone] [email or web address]

## 2021-06-23 NOTE — BH Assessment (Signed)
Clinician messaged Caren Griffins, RN: "Hey. It's Trey with TTS. Is the pt able to engage in the assessment, if so the pt will need to be placed in a private room. Also is the pt under IVC?"   Clinician awaiting response.    Vertell Novak, [place], [facility], [facility] Triage Specialist [phone]

## 2021-06-23 NOTE — ED Notes (Signed)
Pt out of room and states "I have bad anxiety and I can't sleep. And my neck hurts." Pt is cooperative, however he is difficult to redirect and does not follow instructions to return to his room. TCU RN aware.

## 2021-06-23 NOTE — BH Assessment (Signed)
Comprehensive Clinical Assessment (CCA) Note  06/23/2021 KIAM BRANSFIELD [id number]  Disposition: Lindon Romp, PMHNP recommends pt to be discharge, follow up with OPT resources. Disposition discussed with Caren Griffins, RN.  Flowsheet Row ED from 06/22/2021 in [facility] ED from 06/20/2021 in [facility] ED from 06/18/2021 in [facility]  C-SSRS RISK CATEGORY Error: Q3, 4, or 5 should not be populated when Q2 is No Error: Q3, 4, or 5 should not be populated when Q2 is No Error: Q3, 4, or 5 should not be populated when Q2 is No       The patient demonstrates the following risk factors for suicide: Chronic risk factors for suicide include: psychiatric disorder of Major Depressive Disorder, recurrent episode, with atypical features ([place]), GAD . Acute risk factors for suicide include: N/A. Protective factors for this patient include:  None . Considering these factors, the overall suicide risk at this point appears to be . Patient is appropriate for outpatient follow up.  Rishikesh Khachatryan is a 66 year old male who presents voluntary and unaccompanied to [facility] Clinician asked the pt, "what brought you to the hospital?" Pt reports, "I want to go home." Clinician asked the pt about his suicidal thoughts. Pt reports, "I said it earlier but didn't mean it." Pt denies, previous suicide attempts. Pt reports, he's very anxious and is having a panic attack because he missed the deadline (06/03/2021) to receive assistance with his rent; he did not sign paperwork. Pt reports, he has to contact a lady at [facility]. Pt reports, he thinks he will have to pay the full amount of rent in February. Pt reports, he is able to go home. Pt continued to asked for something for anxiety during the assessment. Pt denies, SI, HI, AVH, self-injurious behaviors and access to weapons.   Pt reports, he drank a big can of beer two nights ago.  Pt reports, he drinks everyday. Pt's BAL and UDS is pending. Pt reports, he's linked to a psychiatrist but has not seen them in a while. Per chart, pt is linked to [employer] [phone]. Pt reports, previous inpatient admission at [facility].   Pt presents alert in scrubs with normal speech. Pt's mood was depressed, anxious. Pt's affect was anxious. Pt's insight, judgement was fair. Pt asked if he would be able to be discharged. Clinician discussed the three possible dispositions (discharged with OPT resources, observe/reassess by psychiatry or inpatient treatment) in detail. Pt then asked if he would be discharged tonight, clinician expressed if the recommendation is discharged he will be discharged tonight. Pt reports, he can't see at night and has difficultly walking up stairs. Pt reports, if discharged he can contract for safety.   Diagnosis: Major Depressive Disorder, recurrent episode, with atypical features ([place]).                   GAD.  *Pt denies, having family, friend supports*  Chief Complaint:  Chief Complaint  Patient presents with   Anxiety   Suicidal   Visit Diagnosis:     CCA Screening, Triage and Referral (STR)  Patient Reported Information How did you hear about [place]? Self  What Is the Reason for Your Visit/Call Today? Per EDP note: "Patient presents to ER again with a complaint of anxiety and concern for losing his house. He has reportedly been concerned about losing his house for the past year. He told the nursing staff that he has thoughts of  killing himself, because he "cannot take it anymore." However when I speak with him about any thoughts of self-harm he just repeats that he is worried about losing his home. Denies any active suicidal thoughts or plan at this time. Patient also complains of feeling dehydrated. No reports of any vomiting or diarrhea.  No fever no cough reported."  How Long Has This Been Causing You Problems? 1-6 months  What Do You Feel  Would Help You the Most Today? Housing Assistance; Alcohol or Drug Use Treatment (Anxiety.)   Have You Recently Had Any Thoughts About Hurting Yourself? No (Pt currently denies.)  Are You Planning to Commit Suicide/Harm Yourself At This time? No   Have you Recently Had Thoughts About [place]? No  Are You Planning to Harm Someone at This Time? No  Explanation: No data recorded  Have You Used Any Alcohol or Drugs in the Past 24 Hours? Yes  How Long Ago Did You Use Drugs or Alcohol? No data recorded What Did You Use and How Much? Pt reports, two nights ago he drank a big can of beer. Pt reports, he drinks every night.   Do You Currently Have a Therapist/Psychiatrist? Yes  Name of Therapist/Psychiatrist: Pt reports, she has a psychiatrist but has not seen them in a while. [facility] [phone].)   Have You Been Recently Discharged From Any Office Practice or Programs? No  Explanation of Discharge From Practice/Program: No data recorded    CCA Screening Triage Referral Assessment Type of Contact: Tele-Assessment  Telemedicine Service Delivery: Telemedicine service delivery: This service was provided via telemedicine using a 2-way, interactive audio and video technology  Is this Initial or Reassessment? Initial Assessment  Date Telepsych consult ordered in [facility]:  06/22/21  Time Telepsych consult ordered in CHL:  1026  Location of Assessment: [facility] ED  Provider Location: [facility] Assessment Services   Collateral Involvement: Pt denies, having family, friend supports.   Does Patient Have a Stage manager Guardian? No data recorded Name and Contact of Legal Guardian: No data recorded If Minor and Not Living with Parent(s), Who has Custody? n/a  Is CPS involved or ever been involved? Never  Is APS involved or ever been involved? Never   Patient Determined To Be At Risk for Harm To Self or Others Based on Review of Patient Reported Information or  Presenting Complaint? No  Method: No data recorded Availability of Means: No data recorded Intent: No data recorded Notification Required: No data recorded Additional Information for Danger to Others Potential: No data recorded Additional Comments for Danger to Others Potential: No data recorded Are There Guns or Other Weapons in Your Home? No data recorded Types of Guns/Weapons: No data recorded Are These Weapons Safely Secured?                            No data recorded Who Could Verify You Are Able To Have These Secured: No data recorded Do You Have any Outstanding Charges, Pending Court Dates, Parole/Probation? No data recorded Contacted To Inform of Risk of Harm To Self or Others: -- (no collateral contact)    Does Patient Present under Involuntary Commitment? No  IVC Papers Initial File Date: No data recorded  [place] of Residence: [place]   Patient Currently Receiving the Following Services: Medication Management   Determination of Need: Routine (7 days)   Options For Referral: Medication Management; Outpatient Therapy     CCA Biopsychosocial  Patient Reported Schizophrenia/Schizoaffective Diagnosis in Past: No   Strengths: Pt advocated for himself.   Mental Health Symptoms Depression:   Worthlessness; Hopelessness; Irritability; Difficulty Concentrating; Fatigue; Sleep (too much or little); Increase/decrease in appetite; Tearfulness (Isolation, guilt.)   Duration of Depressive symptoms:    Mania:   None   Anxiety:    Sleep; Tension; Worrying; Restlessness (Panic attacks.)   Psychosis:   None (Pt denies.)   Duration of Psychotic symptoms:    Trauma:   None   Obsessions:   None   Compulsions:   None   Inattention:   Forgetful; Loses things   Hyperactivity/Impulsivity:   Feeling of restlessness; Fidgets with hands/feet   Oppositional/Defiant Behaviors:   None   Emotional Irregularity:   Recurrent suicidal behaviors/gestures/threats;  Mood lability   Other Mood/Personality Symptoms:   depressed    Mental Status Exam Appearance and self-care  Stature:   Average   Weight:   Average weight   Clothing:   -- (Pt in scrubs.)   Grooming:   Normal   Cosmetic use:   None   Posture/gait:   Normal   Motor activity:   Not Remarkable   Sensorium  Attention:   Normal   Concentration:   Normal   Orientation:   X5   Recall/memory:   Normal   Affect and Mood  Affect:   Anxious   Mood:   Anxious; Depressed   Relating  Eye contact:   Normal   Facial expression:   Anxious   Attitude toward examiner:   Cooperative   Thought and Language  Speech flow:  Normal   Thought content:   Appropriate to Mood and Circumstances   Preoccupation:   None   Hallucinations:   None   Organization:  No data recorded  [employer] of Knowledge:   Average   Intelligence:   Average   Abstraction:   Normal   Judgement:   Fair   Reality Testing:   Adequate   Insight:   Fair   Decision Making:   Normal   Social Functioning  Social Maturity:   Isolates   Social Judgement:   Victimized   Stress  Stressors:   Housing   Coping Ability:   Deficient supports; Overwhelmed   Skill Deficits:   Communication; Decision making   Supports:   Support needed     Religion: Religion/Spirituality Are You A Religious Person?: Yes What is Your Religious Affiliation?: Christian  Leisure/Recreation: Leisure / Recreation Do You Have Hobbies?: No (Pt reports, he can't do anything because of his spine condition.)  Exercise/Diet: Exercise/Diet Do You Follow a Special Diet?: Yes Type of Diet: Appetite is poor, he thinks he has throat cancer. Do You Have Any Trouble Sleeping?: Yes Explanation of Sleeping Difficulties: Pt reports, he hasn't been to sleeping in nights.   CCA Employment/Education Employment/Work Situation: Employment / Work Situation Employment Situation: On  disability Why is Patient on Disability: Spine Condition, other medical conditions. How Long has Patient Been on Disability: UTA Has Patient ever Been in the [place]?: No  Education: Education Is Patient Currently Attending School?: No Last Grade Completed: 12 Did You Attend College?: No   CCA Family/Childhood History Family and Relationship History: Family history Marital status: Single Does patient have children?: No  Childhood History:  Childhood History By whom was/is the patient raised?: Mother (Per chart.) Did patient suffer any verbal/emotional/physical/sexual abuse as a child?: No Did patient suffer from severe childhood neglect?: No Has patient ever  been sexually abused/assaulted/raped as an adolescent or adult?: No Was the patient ever a victim of a crime or a disaster?: No Witnessed domestic violence?: No Has patient been affected by domestic violence as an adult?:  (NA)  Child/Adolescent Assessment:     CCA Substance Use Alcohol/Drug Use: Alcohol / Drug Use Pain Medications: See MAR Prescriptions: See MAR Over the Counter: See MAR History of alcohol / drug use?: Yes Withdrawal Symptoms: Tingling, Nausea / Vomiting, Sweats Substance #1 Name of Substance 1: Alcohol. 1 - Age of First Use: UTA 1 - Amount (size/oz): Pt reports, he drank a big can of beer two nights ago. 1 - Frequency: Per pt, everyday. 1 - Duration: Ongoing. 1 - Last Use / Amount: Two nights ago. 1 - Method of Aquiring: Purchase. 1- Route of Use: Oral.     ASAM's:  Six Dimensions of Multidimensional Assessment  Dimension 1:  Acute Intoxication and/or Withdrawal Potential:      Dimension 2:  Biomedical Conditions and Complications:      Dimension 3:  Emotional, Behavioral, or Cognitive Conditions and Complications:     Dimension 4:  Readiness to Change:     Dimension 5:  Relapse, Continued use, or Continued Problem Potential:     Dimension 6:  Recovery/Living Environment:     ASAM  Severity Score:    ASAM Recommended Level of Treatment:     Substance use Disorder (SUD)    Recommendations for Services/Supports/Treatments: Recommendations for Services/Supports/Treatments Recommendations For Services/Supports/Treatments: Medication Management, Individual Therapy  Discharge Disposition:    DSM5 Diagnoses: Patient Active Problem List   Diagnosis Date Noted   MDD (major depressive disorder), recurrent episode, with atypical features ([place]) 05/07/2021   Suicidal ideation 04/05/2021   MDD (major depressive disorder), recurrent episode, severe ([place]) 03/31/2021   Substance induced mood disorder ([place]) 03/31/2021   Family history of colon cancer 12/02/2018   Gastric polyps    Schatzki's ring    Left sided ulcerative colitis without complication ([place])    Odynophagia 11/25/2018   Dysphagia 11/25/2018   Major depressive disorder, recurrent severe without psychotic features ([place]) 06/24/2018   IBS (irritable bowel syndrome)    Hypertension    Colitis    Adjustment disorder with mixed anxiety and depressed mood 05/11/2017   Alcohol abuse    Anxiety    Gastroesophageal reflux disease    Osteopenia determined by x-ray 08/06/2014   Stress fracture of calcaneus 08/06/2014   Vitamin D deficiency 12/18/2013   Dementia ([place]) 12/18/2013   Benign microscopic hematuria 07/06/2013   SIADH (syndrome of inappropriate ADH production) ([place]) 06/22/2013   Ankylosing spondylitis ([place]) 06/20/2013   Malnutrition of moderate degree ([place]) 06/20/2013   BPH (benign prostatic hyperplasia) 08/22/2010   History of colonic polyps 08/13/2008   Essential hypertension 07/03/2007   PUD (peptic ulcer disease) 07/03/2007     Referrals to Alternative Service(s): Referred to Alternative Service(s):   Place:   Date:   Time:    Referred to Alternative Service(s):   Place:   Date:   Time:    Referred to Alternative Service(s):   Place:   Date:   Time:    Referred to Alternative Service(s):   Place:    Date:   Time:     Vertell Novak, [facility] Comprehensive Clinical Assessment (CCA) Screening, Triage and Referral Note  06/23/2021 GADGE HERMIZ [id number]  Chief Complaint:  Chief Complaint  Patient presents with   Anxiety   Suicidal   Visit Diagnosis:   Patient  Reported Information How did you hear about [place]? Self  What Is the Reason for Your Visit/Call Today? Per EDP note: "Patient presents to ER again with a complaint of anxiety and concern for losing his house. He has reportedly been concerned about losing his house for the past year. He told the nursing staff that he has thoughts of killing himself, because he "cannot take it anymore." However when I speak with him about any thoughts of self-harm he just repeats that he is worried about losing his home. Denies any active suicidal thoughts or plan at this time. Patient also complains of feeling dehydrated. No reports of any vomiting or diarrhea.  No fever no cough reported."  How Long Has This Been Causing You Problems? 1-6 months  What Do You Feel Would Help You the Most Today? Housing Assistance; Alcohol or Drug Use Treatment (Anxiety.)   Have You Recently Had Any Thoughts About Hurting Yourself? No (Pt currently denies.)  Are You Planning to Commit Suicide/Harm Yourself At This time? No   Have you Recently Had Thoughts About [place]? No  Are You Planning to Harm Someone at This Time? No  Explanation: No data recorded  Have You Used Any Alcohol or Drugs in the Past 24 Hours? Yes  How Long Ago Did You Use Drugs or Alcohol? No data recorded What Did You Use and How Much? Pt reports, two nights ago he drank a big can of beer. Pt reports, he drinks every night.   Do You Currently Have a Therapist/Psychiatrist? Yes  Name of Therapist/Psychiatrist: Pt reports, she has a psychiatrist but has not seen them in a while. [facility] [phone].)   Have You Been Recently Discharged From Any Office  Practice or Programs? No  Explanation of Discharge From Practice/Program: No data recorded   CCA Screening Triage Referral Assessment Type of Contact: Tele-Assessment  Telemedicine Service Delivery: Telemedicine service delivery: This service was provided via telemedicine using a 2-way, interactive audio and video technology  Is this Initial or Reassessment? Initial Assessment  Date Telepsych consult ordered in [facility]:  06/22/21  Time Telepsych consult ordered in [facility]L:  1026  Location of Assessment: [facility]  Provider Location: [facility] Assessment Services   Collateral Involvement: Pt denies, having family, friend supports.   Does Patient Have a Stage manager Guardian? No data recorded Name and Contact of Legal Guardian: No data recorded If Minor and Not Living with Parent(s), Who has Custody? n/a  Is CPS involved or ever been involved? Never  Is APS involved or ever been involved? Never   Patient Determined To Be At Risk for Harm To Self or Others Based on Review of Patient Reported Information or Presenting Complaint? No  Method: No data recorded Availability of Means: No data recorded Intent: No data recorded Notification Required: No data recorded Additional Information for Danger to Others Potential: No data recorded Additional Comments for Danger to Others Potential: No data recorded Are There Guns or Other Weapons in Your Home? No data recorded Types of Guns/Weapons: No data recorded Are These Weapons Safely Secured?                            No data recorded Who Could Verify You Are Able To Have These Secured: No data recorded Do You Have any Outstanding Charges, Pending Court Dates, Parole/Probation? No data recorded Contacted To Inform of Risk of Harm To Self or Others: -- (no collateral  contact)   Does Patient Present under Involuntary Commitment? No  IVC Papers Initial File Date: No data recorded  [place] of Residence: [place]   Patient Currently  Receiving the Following Services: Medication Management   Determination of Need: Routine (7 days)   Options For Referral: Medication Management; Outpatient Therapy   Discharge Disposition:     Vertell Novak, [place], [place], [facility], [facility] Triage Specialist [phone]

## 2021-06-23 NOTE — ED Notes (Signed)
TTS consult in progress.

## 2021-06-23 NOTE — ED Provider Notes (Signed)
Emergency Medicine Observation Re-evaluation Note  Anthony Lamb is a 66 y.o. male, seen on rounds today.  Pt initially presented to the ED for complaints of Anxiety and Suicidal Currently, the patient is stable for discharge.  Physical Exam  BP 121/78 (BP Location: Right Arm)    Pulse 80    Temp 98 F (36.7 C) (Oral)    Resp 20    Ht 5' 10"  (1.778 m)    Wt 65.8 kg    SpO2 97%    BMI 20.80 kg/m  Physical Exam General: Calm Cardiac: well perfused  Lungs: even, unlabored respirations  Psych: calm  ED Course / MDM  EKG:   I have reviewed the labs performed to date as well as medications administered while in observation.  Recent changes in the last 24 hours include TTS evaluation and psych clear. Plan for discharge.  Plan  Current plan is for discharge.  DASHIEL BERGQUIST is not under involuntary commitment.     Margette Fast, MD 06/23/21 [phone]

## 2021-06-23 NOTE — ED Notes (Signed)
Pt up and at door to his room. Pt continues to tell staff that he will not be able to make it inside his apartment if he is discharged tonight because it's dark and he can't see, he is dizzy and he needs help getting up the stairs. This Probation officer has repeatedly advised pt that he is not yet up for discharge and we will worry about how he will gain entry to his apartment when the time arises. Pt repeats his concerns/complaints several times despite my attempts to redirect pt. Pt then states he cannot walk because he is dizzy. I asked pt to go back to his room and lay down and pt states he cannot walk from the door of his room to the bed.  However, seconds later, pt walked to his bed on his own and w/out assistance. Pt seated on bed without incident. TCU RN aware.

## 2021-06-23 NOTE — ED Notes (Signed)
Patient DC d off unit to home per provider. Patient alert and no s/s of distress. DC information and belongings given to patient. Patient ambulatory with cane. Patient off unit in w/c, escorted by NT. Patient transported by set up transport by SW.

## 2021-06-23 NOTE — ED Notes (Signed)
Patient constantly coming out of room reporting dizziness, difficulty walking although ambulating with no assist. Reports fear of being discharged due to not being able to walk upstairs. EDP informed of discharge per counselor Taye but has yet to discharge patient or update status. Patient advised of pending discharge.

## 2021-06-24 ENCOUNTER — Other Ambulatory Visit: Payer: Self-pay

## 2021-06-24 ENCOUNTER — Encounter (HOSPITAL_BASED_OUTPATIENT_CLINIC_OR_DEPARTMENT_OTHER): Payer: Self-pay

## 2021-06-24 ENCOUNTER — Emergency Department (HOSPITAL_BASED_OUTPATIENT_CLINIC_OR_DEPARTMENT_OTHER)
Admission: EM | Admit: 2021-06-24 | Discharge: 2021-06-24 | Disposition: A | Discharge status: Home / Self Care | Payer: Medicaid Other | Attending: Emergency Medicine | Admitting: Emergency Medicine

## 2021-06-24 DIAGNOSIS — J3489 Other specified disorders of nose and nasal sinuses: Secondary | ICD-10-CM | POA: Insufficient documentation

## 2021-06-24 DIAGNOSIS — R197 Diarrhea, unspecified: Secondary | ICD-10-CM | POA: Insufficient documentation

## 2021-06-24 DIAGNOSIS — F419 Anxiety disorder, unspecified: Secondary | ICD-10-CM | POA: Insufficient documentation

## 2021-06-24 DIAGNOSIS — Z20822 Contact with and (suspected) exposure to covid-19: Secondary | ICD-10-CM | POA: Diagnosis not present

## 2021-06-24 DIAGNOSIS — R0981 Nasal congestion: Secondary | ICD-10-CM | POA: Insufficient documentation

## 2021-06-24 LAB — RESP PANEL BY RT-PCR (FLU A&B, COVID) ARPGX2
Influenza A by PCR: NEGATIVE
Influenza B by PCR: NEGATIVE
SARS Coronavirus 2 by RT PCR: NEGATIVE

## 2021-06-24 LAB — CBG MONITORING, ED: Glucose-Capillary: 123 mg/dL — ABNORMAL HIGH (ref 70–99)

## 2021-06-24 MED ORDER — FLUTICASONE PROPIONATE 50 MCG/ACT NA SUSP: 1.0000 | Freq: Every day | NASAL | 0 refills | Status: DC

## 2021-06-24 NOTE — Discharge Instructions (Addendum)
It was a pleasure caring for you today in the emergency department. ° °Please return to the emergency department for any worsening or worrisome symptoms. ° ° °

## 2021-06-24 NOTE — ED Notes (Signed)
Pt 's Lung clear s1 s2 heard

## 2021-06-24 NOTE — ED Notes (Signed)
Pt is ambulatory and steady Ptcontinues to get out of his seat and wander around room

## 2021-06-24 NOTE — ED Provider Notes (Signed)
[facility] EMERGENCY DEPT Provider Note   CSN: [id number] Arrival date & time: 06/24/21  0865     History  Chief Complaint  Patient presents with   Weakness    Anthony Lamb is a 66 y.o. male.  This is a 66 y.o. male  with significant medical history as below, including  IBS, etoh abuse, benzo abuse who presents to the ED with complaint of concern that he may have the flu.  Patient is very well-known to this emergency department and other departments in this system.  He was recently discharged from [facility].  Patient is concerned that he might of contracted the flu at [facility].  Patient reports he has been having intermittent diarrhea which is chronic for him.  He feels tired.  Is requesting something to drink because he thinks he might be dehydrated because he has not had breakfast this morning.  Patient denies suicidal or homicidal ideation.  No chest pain, dyspnea, nausea or vomiting.  Patient multiple times requesting narcotic pain medications for his ankylosing spondylitis.  Reports he has been taking Tylenol, last dose was yesterday.  Feels the Tylenol is not helping his pain.  Denies alcohol use this morning.  Does not appear intoxicated.  He has some mild sinus congestion, clear rhinorrhea.  No fevers or chills.  No cough.  No dyspnea.  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 No language interpreter was used.  Weakness Associated symptoms: diarrhea   Associated symptoms: no abdominal pain, no arthralgias, no chest pain, no cough, no dysuria, no fever, no seizures, no shortness of breath and no vomiting       Home Medications Prior to Admission medications   Medication Sig Start Date End Date Taking? Authorizing Provider  fluticasone (FLONASE) 50 MCG/ACT nasal spray Place 1 spray into both nostrils daily for 7 days. 06/24/21 07/01/21 Yes Wynona Dove A, DO  diazepam (VALIUM) 5 MG tablet Take 1 tablet (5 mg total) by mouth 2 (two) times daily. 06/10/21   Carmin Muskrat, MD  diclofenac Sodium (VOLTAREN) 1 % GEL Apply 2 g topically 4 (four) times daily as needed (to painful sites).    [provider]  FLUoxetine (PROZAC) 20 MG capsule Take 1 capsule (20 mg total) by mouth daily. Patient not taking: Reported on 06/14/2021 05/21/21   Parks Ranger, DO  lisinopril (ZESTRIL) 10 MG tablet Take 10 mg by mouth at bedtime. 06/05/21   [provider]  meloxicam (MOBIC) 15 MG tablet Take 15 mg by mouth daily. 05/05/21   [provider]  Multiple Vitamin (MULTIVITAMIN WITH MINERALS) TABS tablet Take 1 tablet by mouth daily. (May buy over the counter) Patient taking differently: Take 1 tablet by mouth daily. 06/28/18   Connye Burkitt, NP  pantoprazole (PROTONIX) 40 MG tablet Take 1 tablet (40 mg total) by mouth daily. Patient taking differently: Take 40 mg by mouth  at bedtime. 04/24/14   Janith Lima, MD  propranolol (INDERAL) 10 MG tablet Take 1 tablet (10 mg total) by mouth 2 (two) times daily. 05/20/21   Parks Ranger, DO  QUEtiapine (SEROQUEL) 300 MG tablet Take 1 tablet (300 mg total) by mouth at bedtime. 05/20/21   Parks Ranger, DO  REXULTI 2 MG TABS tablet Take 2 mg by mouth daily. 05/05/21   [provider]  risperiDONE (RISPERDAL) 0.5 MG tablet Take 1  tablet (0.5 mg total) by mouth 2 (two) times daily. 05/20/21   Parks Ranger, DO  sucralfate (CARAFATE) 1 g tablet Take 1 g by mouth 4 (four) times daily. 06/05/21   [provider]  tiZANidine (ZANAFLEX) 4 MG tablet Take 2 mg by mouth 3 (three) times daily as needed for pain. 06/05/21   [provider]  traZODone (DESYREL) 50 MG tablet Take 1 tablet (50 mg total) by mouth at bedtime. 05/20/21   Parks Ranger, DO  verapamil (CALAN-SR) 240 MG CR tablet Take 240 mg by mouth at bedtime. 06/07/21   [provider]  zolpidem (AMBIEN) 10 MG tablet Take 1 tablet (10 mg total) by mouth at bedtime. 05/20/21   Parks Ranger, DO      Allergies    Nsaids, Diphenhydramine hcl, Flexeril [cyclobenzaprine], Hctz [hydrochlorothiazide], Rexulti [brexpiprazole], Seroquel [quetiapine], Gabapentin, and Hydroxyzine    Review of Systems   Review of Systems  Constitutional:  Negative for chills and fever.  HENT:  Positive for congestion. Negative for ear pain and sore throat.   Eyes:  Negative for pain and visual disturbance.  Respiratory:  Negative for cough and shortness of breath.   Cardiovascular:  Negative for chest pain and palpitations.  Gastrointestinal:  Positive for diarrhea. Negative for abdominal pain and vomiting.  Genitourinary:  Negative for dysuria and hematuria.  Musculoskeletal:  Negative for arthralgias and back pain.  Skin:  Negative for color change and rash.  Neurological:  Negative for seizures and syncope.  All other systems reviewed and are negative.  Physical Exam Updated Vital Signs BP 110/74 (BP Location: Left Arm)    Pulse 80    Temp 98.1 F (36.7 C)    Resp 16    SpO2 99%  Physical Exam Vitals and nursing note reviewed.  Constitutional:      General: He is not in acute distress.    Appearance: He is well-developed. He is not ill-appearing, toxic-appearing or diaphoretic.     Comments: Disheveled  HENT:     Head:  Normocephalic and atraumatic.     Right Ear: External ear normal.     Left Ear: External ear normal.     Mouth/Throat:     Mouth: Mucous membranes are moist.  Eyes:     General: No scleral icterus.    Extraocular Movements: Extraocular movements intact.     Pupils: Pupils are equal, round, and reactive to light.  Cardiovascular:     Rate and Rhythm: Normal rate and regular rhythm.     Pulses: Normal pulses.     Heart sounds: Normal heart sounds.  Pulmonary:     Effort: Pulmonary effort is normal. No respiratory distress.     Breath sounds: Normal breath sounds.  Abdominal:     General: Abdomen is flat.     Palpations: Abdomen is soft.     Tenderness: There is no abdominal tenderness.  Musculoskeletal:        General: Normal range of motion.  Cervical back: Normal range of motion.     Right lower leg: No edema.     Left lower leg: No edema.  Skin:    General: Skin is warm and dry.     Capillary Refill: Capillary refill takes less than 2 seconds.  Neurological:     Mental Status: He is alert and oriented to person, place, and time.     GCS: GCS eye subscore is 4. GCS verbal subscore is 5. GCS motor subscore is 6.     Cranial Nerves: Cranial nerves 2-12 are intact. No dysarthria.     Sensory: Sensation is intact.     Motor: Motor function is intact. No tremor.     Coordination: Coordination is intact. Coordination normal.     Gait: Gait is intact. Gait normal.  Psychiatric:        Mood and Affect: Mood normal.        Behavior: Behavior normal.    ED Results / Procedures / Treatments   Labs (all labs ordered are listed, but only abnormal results are displayed) Labs Reviewed  CBG MONITORING, ED - Abnormal; Notable for the following components:      Result Value   Glucose-Capillary 123 (*)    All other components within normal limits  RESP PANEL BY RT-PCR (FLU A&B, COVID) [id number]    EKG EKG Interpretation  Date/Time:  Friday June 24 2021 08:21:13 EST Ventricular  Rate:  78 PR Interval:  164 QRS Duration: 98 QT Interval:  410 QTC Calculation: 467 R Axis:   63 Text Interpretation: Normal sinus rhythm Nonspecific ST abnormality Abnormal ECG When compared with ECG of 02-Jun-2021 08:25, PREVIOUS ECG IS PRESENT no stemi Confirmed by Wynona Dove ([id number]) on 06/24/2021 8:31:38 AM  Radiology No results found.  Procedures Procedures    Medications Ordered in ED Medications - No data to display  ED Course/ Medical Decision Making/ A&P                           Medical Decision Making  Patient presents with recurrent anxiety, concern of dehydration viral syndrome.  No thoughts of self-harm or verbalization of self-harm.  Does not appear intoxicated.  Does not appear psychotic.  Does not appear to be clinically dehydrated.  Mucous membranes are moist.  Vital signs are stable.  Patient with multiple ER visits with similar complaints.  Patient is tolerating oral intake without difficulty.  He reports he thinks he is dizzy but patient is ambulatory with a steady gait in the emergency department.  Walking to the nurses station multiple times asking for something for anxiety or some stronger pain medications.  He has no focal deficits on his neurologic exam.  No external signs of trauma on his head.  Patient is upset that he does not have a way to get to the grocery store or does not know how to pay his water bill.  EKG reviewed, normal sinus rhythm.  Fingerstick glucose is stable.  COVID/flu testing negative.  Patient with multiple negative work-ups recently, vital signs are stable.  Is able tolerate intake without difficulty.  Unchanged chronic complaints.  Feel patient is stable for discharge.  Patient is able to eat multiple food items and drink liquids without any difficulty.  He has been ambulatory throughout the emergency department coming to the nurses station multiple times asking for narcotics, benzos, help with his rent payment.  Social work consult  placed. Given information regarding o/p resources.  Financial assistance.  Not suicidal, not homicidal.  Advised patient follow-up with his PCP.  The patient improved significantly and was discharged in stable condition. Detailed discussions were had with the patient regarding current findings, and need for close f/u with PCP or on call doctor. The patient has been instructed to return immediately if the symptoms worsen in any way for re-evaluation. Patient verbalized understanding and is in agreement with current care plan. All questions answered prior to discharge.         Final Clinical Impression(s) / ED Diagnoses Final diagnoses:  Anxiousness    Rx / DC Orders ED Discharge Orders          Ordered    fluticasone (FLONASE) 50 MCG/ACT nasal spray  Daily        06/24/21 0830              Jeanell Sparrow, DO 06/24/21 [phone]

## 2021-06-24 NOTE — ED Triage Notes (Signed)
Pt BIB [facility] from home, recently seen at [facility] ED 2 days ago. Today pt heels he "picked up the flu" while at [facility] ED. Pt also c/o diarrhea.   BP 134/76 HR 80 RR 20 97% RA  CBG 103

## 2021-06-29 ENCOUNTER — Encounter (HOSPITAL_COMMUNITY): Payer: Self-pay | Admitting: Emergency Medicine

## 2021-06-29 ENCOUNTER — Other Ambulatory Visit: Payer: Self-pay

## 2021-06-29 ENCOUNTER — Emergency Department (HOSPITAL_COMMUNITY)
Admission: EM | Admit: 2021-06-29 | Discharge: 2021-06-30 | Disposition: A | Discharge status: Home / Self Care | Payer: Medicaid Other | Attending: Emergency Medicine | Admitting: Emergency Medicine

## 2021-06-29 DIAGNOSIS — Z765 Malingerer [conscious simulation]: Secondary | ICD-10-CM

## 2021-06-29 DIAGNOSIS — Z20822 Contact with and (suspected) exposure to covid-19: Secondary | ICD-10-CM | POA: Diagnosis not present

## 2021-06-29 DIAGNOSIS — F1020 Alcohol dependence, uncomplicated: Secondary | ICD-10-CM | POA: Diagnosis not present

## 2021-06-29 DIAGNOSIS — F333 Major depressive disorder, recurrent, severe with psychotic symptoms: Secondary | ICD-10-CM | POA: Insufficient documentation

## 2021-06-29 DIAGNOSIS — R45851 Suicidal ideations: Secondary | ICD-10-CM | POA: Insufficient documentation

## 2021-06-29 DIAGNOSIS — F32A Depression, unspecified: Secondary | ICD-10-CM | POA: Diagnosis present

## 2021-06-29 DIAGNOSIS — T1491XA Suicide attempt, initial encounter: Secondary | ICD-10-CM

## 2021-06-29 DIAGNOSIS — F419 Anxiety disorder, unspecified: Secondary | ICD-10-CM

## 2021-06-29 LAB — RAPID URINE DRUG SCREEN, HOSP PERFORMED
Amphetamines: NOT DETECTED
Barbiturates: NOT DETECTED
Benzodiazepines: POSITIVE — AB
Cocaine: NOT DETECTED
Opiates: NOT DETECTED
Tetrahydrocannabinol: NOT DETECTED

## 2021-06-29 LAB — COMPREHENSIVE METABOLIC PANEL
ALT: 13 U/L (ref 0–44)
AST: 18 U/L (ref 15–41)
Albumin: 3.8 g/dL (ref 3.5–5.0)
Alkaline Phosphatase: 65 U/L (ref 38–126)
Anion gap: 12 (ref 5–15)
BUN: <5 mg/dL — ABNORMAL LOW (ref 8–23)
CO2: 24 mmol/L (ref 22–32)
Calcium: 9 mg/dL (ref 8.9–10.3)
Chloride: 93 mmol/L — ABNORMAL LOW (ref 98–111)
Creatinine, Ser: 0.77 mg/dL (ref 0.61–1.24)
GFR, Estimated: >60 mL/min (ref >60)
Glucose, Bld: 99 mg/dL (ref 70–99)
Potassium: 3.9 mmol/L (ref 3.5–5.1)
Sodium: 129 mmol/L — ABNORMAL LOW (ref 135–145)
Total Bilirubin: 0.5 mg/dL (ref 0.3–1.2)
Total Protein: 6.2 g/dL — ABNORMAL LOW (ref 6.5–8.1)

## 2021-06-29 LAB — CBC WITH DIFFERENTIAL/PLATELET
Abs Immature Granulocytes: 0.02 10*3/uL (ref 0.00–0.07)
Basophils Absolute: 0 10*3/uL (ref 0.0–0.1)
Basophils Relative: 0 %
Eosinophils Absolute: 0 10*3/uL (ref 0.0–0.5)
Eosinophils Relative: 0 %
HCT: 38.2 % — ABNORMAL LOW (ref 39.0–52.0)
Hemoglobin: 13.1 g/dL (ref 13.0–17.0)
Immature Granulocytes: 0 %
Lymphocytes Relative: 11 %
Lymphs Abs: 0.8 10*3/uL (ref 0.7–4.0)
MCH: 31.3 pg (ref 26.0–34.0)
MCHC: 34.3 g/dL (ref 30.0–36.0)
MCV: 91.2 fL (ref 80.0–100.0)
Monocytes Absolute: 0.8 10*3/uL (ref 0.1–1.0)
Monocytes Relative: 12 %
Neutro Abs: 5.2 10*3/uL (ref 1.7–7.7)
Neutrophils Relative %: 77 %
Platelets: 267 10*3/uL (ref 150–400)
RBC: 4.19 MIL/uL — ABNORMAL LOW (ref 4.22–5.81)
RDW: 12.8 % (ref 11.5–15.5)
WBC: 6.8 10*3/uL (ref 4.0–10.5)
nRBC: 0 % (ref 0.0–0.2)

## 2021-06-29 LAB — RESP PANEL BY RT-PCR (FLU A&B, COVID) ARPGX2
Influenza A by PCR: NEGATIVE
Influenza B by PCR: NEGATIVE
SARS Coronavirus 2 by RT PCR: NEGATIVE

## 2021-06-29 LAB — ETHANOL: Alcohol, Ethyl (B): <10 mg/dL (ref <10)

## 2021-06-29 MED ORDER — TIZANIDINE HCL 4 MG PO TABS
2.0000 mg | ORAL_TABLET | Freq: Three times a day (TID) | ORAL | Status: DC | PRN
  Administered 2021-06-30: 2 mg via ORAL
  Filled 2021-06-29: qty 1

## 2021-06-29 MED ORDER — PROPRANOLOL HCL 10 MG PO TABS
10.0000 mg | ORAL_TABLET | Freq: Two times a day (BID) | ORAL | Status: DC
  Administered 2021-06-30 (×2): 10 mg via ORAL
  Filled 2021-06-29 (×3): qty 1

## 2021-06-29 MED ORDER — QUETIAPINE FUMARATE 200 MG PO TABS
300.0000 mg | ORAL_TABLET | Freq: Every day | ORAL | Status: DC
  Administered 2021-06-29: 22:00:00 300 mg via ORAL
  Filled 2021-06-29: qty 1

## 2021-06-29 MED ORDER — TRAZODONE HCL 50 MG PO TABS
50.0000 mg | ORAL_TABLET | Freq: Every day | ORAL | Status: DC
  Administered 2021-06-29: 22:00:00 50 mg via ORAL
  Filled 2021-06-29: qty 1

## 2021-06-29 MED ORDER — PANTOPRAZOLE SODIUM 40 MG PO TBEC
40.0000 mg | DELAYED_RELEASE_TABLET | Freq: Every day | ORAL | Status: DC
  Administered 2021-06-29 – 2021-06-30 (×2): 40 mg via ORAL
  Filled 2021-06-29 (×2): qty 1

## 2021-06-29 MED ORDER — SUCRALFATE 1 G PO TABS
1.0000 g | ORAL_TABLET | Freq: Four times a day (QID) | ORAL | Status: DC
  Administered 2021-06-29 – 2021-06-30 (×3): 1 g via ORAL
  Filled 2021-06-29 (×6): qty 1

## 2021-06-29 MED ORDER — ZOLPIDEM TARTRATE 5 MG PO TABS
10.0000 mg | ORAL_TABLET | Freq: Every day | ORAL | Status: DC
  Administered 2021-06-29: 22:00:00 10 mg via ORAL
  Filled 2021-06-29: qty 2

## 2021-06-29 MED ORDER — ACETAMINOPHEN 325 MG PO TABS
650.0000 mg | ORAL_TABLET | Freq: Once | ORAL | Status: AC
  Administered 2021-06-29: 16:00:00 650 mg via ORAL
  Filled 2021-06-29: qty 2

## 2021-06-29 MED ORDER — RISPERIDONE 0.5 MG PO TABS
0.5000 mg | ORAL_TABLET | Freq: Two times a day (BID) | ORAL | Status: DC
  Administered 2021-06-29 – 2021-06-30 (×2): 0.5 mg via ORAL
  Filled 2021-06-29 (×2): qty 1

## 2021-06-29 MED ORDER — LISINOPRIL 10 MG PO TABS
10.0000 mg | ORAL_TABLET | Freq: Every day | ORAL | Status: DC
  Administered 2021-06-29: 22:00:00 10 mg via ORAL
  Filled 2021-06-29: qty 1

## 2021-06-29 NOTE — ED Notes (Signed)
Pt [facility] to [facility]. Settled into bed. Provided with pillow and warm blankets. Continues to request something for anxiety, reiterated that he will not be getting anything at this time for anxiety.

## 2021-06-29 NOTE — ED Triage Notes (Addendum)
Pt BIB [facility] for medical clearance, supposed to have a caregiver that comes, lives alone in an apartment. Pt called 911 and hung up, stating he didn't need anything. Pt states he has undiagnosed cancer he is dying from. Pt stated he didn't want to go to the hospital then grabbed a handful of pills (duloxetine) and tried to swallow them, EMS removed pills before pt was able to do so. Per EMS, pt has multiple various complaints.  EMS VS- 160/102, HR 94, SpO2 97%, RR 18.

## 2021-06-29 NOTE — ED Notes (Signed)
RN and NT at bedside, pt found to be walking around in room. Pt had previously stated that he was unable to walk. Pt redirected and resting comfortably in bed at this time.

## 2021-06-29 NOTE — ED Notes (Signed)
Pt out of room, stating "I don't know what to do. I can't go back home. I don't have nowhere to go. I won't be able to pay my bills. I can't breathe." Pt encouraged to go back to his room and lie down. Pt encouraged to take deep breaths and try to focus on right now instead of what happens later.

## 2021-06-29 NOTE — ED Notes (Signed)
Pt ambulated out of room and asked this RN for medication for his anxiety. When told that this RN would look to see if he had anything ordered, pt started to ramble about the other medications he needed. This RN educated him that if he had any medications scheduled they would be given at that time. Pt then stated he was feeling dizzy, this RN told him if he was feeling dizzy to go and lie down in bed. With the help of an NT, pt was redirected back into his room and is sitting in bed.

## 2021-06-29 NOTE — ED Notes (Signed)
IVC orders completed by [facility] at this time. Pt made aware. Pt verbalizes understanding.

## 2021-06-29 NOTE — ED Notes (Signed)
IVC paper work is done and handed to the nurse

## 2021-06-29 NOTE — Progress Notes (Signed)
Patient has been denied by [facility] and [facility] due to no appropriate beds available. Patient meets [facility] inpatient criteria per Earleen Newport, NP. Patient has been faxed out to the following facilities:   [facility]  [address] [place] [address] [phone] --  [facility]  [address] [place] [address] [phone] --  [facility]  [address] [place] [address] [phone] --  [facility]  [address] [place] [address] [phone] --  [facility]  [address] [place], [place] [address] [phone] --  [facility]  [address], [place] [address] [phone] --  [facility]  [address], [place] [address] [phone] --  [facility]  [address] [place] [address] [phone] --  [facility]  [address], [place] [address] [phone] --  [facility]  [address] [place] [address] [phone] --  [facility]  [place], [place] [address] [phone] --  [facility]  [address], [place] [address] [phone] --  [facility]  [address], [place] [address] [phone] --   Mariea Clonts, MSW, LCSW-A  1:23 PM 06/29/2021

## 2021-06-29 NOTE — ED Notes (Signed)
Pt requesting to go home. Pt also requesting Tylenol, and stating, "I'm so dizzy, I can't eat, I don't think I can go home. I'm too dizzy, I don't know how I will get back in my apartment." Pt standing independently in front of nursing station. Pt repeating, "I guess I better go." This RN informed patient that he is voluntary and he may leave at any time. Pt ambulated back to room with steady gait.

## 2021-06-29 NOTE — ED Notes (Signed)
MD at bedside discussing treatment options with pt. MD has made it clear to patient that he may not have anything for sleeping.

## 2021-06-29 NOTE — ED Notes (Signed)
Pt out of room requesting snack. Pt educated on meal and snack times, and instructed to go back to room. Pt then requested to use BR. Pt ambulated to BR independently. Pt provided with urine specimen cup to provide urine sample.

## 2021-06-29 NOTE — ED Notes (Signed)
Pt coming out of room, repeating, "Oh, god. Oh, god. Oh, god. Everything's over. It's all over." Pt encouraged to go back to room, lay down and watch TV.

## 2021-06-29 NOTE — ED Provider Notes (Signed)
[facility] EMERGENCY DEPARTMENT Provider Note    CSN: [id number] Arrival date & time: 06/29/21 7829  History Chief Complaint  Patient presents with   Medical Clearance    Anthony Lamb is a 66 y.o. male with history of chronic anxiety called 911 this morning then hung up. EMS was dispatched and initially he declined any help but then per EMS he took a handful of pills and tried to swallow them. The pills were knocked out of his hand before he could get them into his mouth. As with numerous previous ED visits for similar presentation he is perseverating on 'being severely dehydrated'. He is convinced he has some sort of cancer although he has no actual diagnosis of any malignancy. He is frequently anxious in the ED, numerous Rx for benzos from a variety of ED and outpatient providers.    Home Medications Prior to Admission medications   Medication Sig Start Date End Date Taking? Authorizing Provider  diazepam (VALIUM) 5 MG tablet Take 1 tablet (5 mg total) by mouth 2 (two) times daily. 06/10/21   Carmin Muskrat, MD  diclofenac Sodium (VOLTAREN) 1 % GEL Apply 2 g topically 4 (four) times daily as needed (to painful sites).    [provider]  FLUoxetine (PROZAC) 20 MG capsule Take 1 capsule (20 mg total) by mouth daily. Patient not taking: Reported on 06/14/2021 05/21/21   Parks Ranger, DO  fluticasone [facility]) 50 MCG/ACT nasal spray Place 1 spray into both nostrils daily for 7 days. 06/24/21 07/01/21  Wynona Dove A, DO  lisinopril (ZESTRIL) 10 MG tablet Take 10 mg by mouth at bedtime. 06/05/21   [provider]  meloxicam (MOBIC) 15 MG tablet Take 15 mg by mouth daily. 05/05/21   [provider]  Multiple Vitamin (MULTIVITAMIN WITH MINERALS) TABS tablet Take 1 tablet by mouth daily. (May buy over the counter) Patient taking differently: Take 1 tablet by mouth daily. 06/28/18   Connye Burkitt, NP  pantoprazole (PROTONIX) 40 MG tablet Take 1 tablet (40  mg total) by mouth daily. Patient taking differently: Take 40 mg by mouth at bedtime. 04/24/14   Janith Lima, MD  propranolol (INDERAL) 10 MG tablet Take 1 tablet (10 mg total) by mouth 2 (two) times daily. 05/20/21   Parks Ranger, DO  QUEtiapine (SEROQUEL) 300 MG tablet Take 1 tablet (300 mg total) by mouth at bedtime. 05/20/21   Parks Ranger, DO  REXULTI 2 MG TABS tablet Take 2 mg by mouth daily. 05/05/21   [provider]  risperiDONE (RISPERDAL) 0.5 MG tablet Take 1 tablet (0.5 mg total) by mouth 2 (two) times daily. 05/20/21   Parks Ranger, DO  sucralfate (CARAFATE) 1 g tablet Take 1 g by mouth 4 (four) times daily. 06/05/21   [provider]  tiZANidine (ZANAFLEX) 4 MG tablet Take 2 mg by mouth 3 (three) times daily as needed for pain. 06/05/21   [provider]  traZODone (DESYREL) 50 MG tablet Take 1 tablet (50 mg total) by mouth at bedtime. 05/20/21   Parks Ranger, DO  verapamil (CALAN-SR) 240 MG CR tablet Take 240 mg by mouth at bedtime. 06/07/21   [provider]  zolpidem (AMBIEN) 10 MG tablet Take 1 tablet (10 mg total) by mouth at bedtime. 05/20/21   Parks Ranger, DO     Allergies    Nsaids, Diphenhydramine hcl, Flexeril [cyclobenzaprine], Hctz [hydrochlorothiazide], Rexulti [brexpiprazole], Seroquel [quetiapine], Gabapentin, and Hydroxyzine  Review of Systems   Review of Systems Please see HPI for pertinent positives and negatives  Physical Exam BP (!) 159/91    Pulse 94    Temp 98.5 F (36.9 C)    Resp 20    Ht 5' 10"  (1.778 m)    Wt 63.5 kg    SpO2 97%    BMI 20.09 kg/m   Physical Exam Vitals and nursing note reviewed.  Constitutional:      Appearance: Normal appearance.  HENT:     Head: Normocephalic and atraumatic.     Nose: Nose normal.     Mouth/Throat:     Mouth: Mucous membranes are moist.  Eyes:     Extraocular Movements: Extraocular movements intact.      Conjunctiva/sclera: Conjunctivae normal.  Cardiovascular:     Rate and Rhythm: Normal rate.  Pulmonary:     Effort: Pulmonary effort is normal.     Breath sounds: Normal breath sounds.  Abdominal:     General: Abdomen is flat.     Palpations: Abdomen is soft.     Tenderness: There is no abdominal tenderness.  Musculoskeletal:        General: No swelling. Normal range of motion.     Cervical back: Neck supple.  Skin:    General: Skin is warm and dry.  Neurological:     General: No focal deficit present.     Mental Status: He is alert.  Psychiatric:     Comments: Fixated on being 'dehydrated', repeatedly requesting labs be checked.     ED Results / Procedures / Treatments   EKG None  Procedures Procedures  Medications Ordered in the ED Medications - No data to display  Initial Impression and Plan  Patient with frequent ED visits for similar presentation however per EMS he had an actual attempt at self harm by swallowing a handful of pills today. Will check medical screening labs, he has had mild hyponatremia in the past but none in recent visits.   ED Course   Clinical Course as of 06/29/21 1246  Wed Jun 29, 2021  0927 CBC is unremarkable.  [CS]  1572 Sodium is borderline low, similar to previous. His Cr and GFR are normal. He is eating and drinking in the room without difficulty. States he cannot walk but is walking without difficulty as well.  [CS]  6203 Patient still reporting he is going to 'kill himself if he goes home'. Will consult TTS. Patient provided food and water. He is otherwise medically clear for Psych evaluation.  [CS]  5597 Patient seen by TTS, recommends [facility] admission.  [CS]    Clinical Course User Index [CS] Truddie Hidden, MD     MDM Rules/Calculators/A&P Medical Decision Making Amount and/or Complexity of Data Reviewed External Data Reviewed: labs and radiology. Labs: ordered. Decision-making details documented in ED Course. Radiology:  ordered and independent interpretation performed. Decision-making details documented in ED Course.  Risk Decision regarding hospitalization.    Final Clinical Impression(s) / ED Diagnoses Final diagnoses:  Anxiety  Suicidal ideation    Rx / DC Orders ED Discharge Orders     None        Truddie Hidden, MD 06/29/21 1246

## 2021-06-29 NOTE — ED Notes (Signed)
Pt requesting Tylenol, stating he has fever. Temp 99.0. Now pt states he needs Tylenol for pain. MD made aware.

## 2021-06-29 NOTE — ED Notes (Signed)
Dinner tray delivered to pt at this time.  °
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
## 2021-06-29 NOTE — BH Assessment (Signed)
SecureChatted pt's RN Camryn Cogan to initiate TTS assessment but did not get any response.  Ivoree Felmlee T. Mare Ferrari, [place], [facility], [facility] Triage Specialist [facility]

## 2021-06-29 NOTE — ED Notes (Signed)
Pt again ambulating out of room, stating that he has cancer, and that this is the end for me. Pt stating, "I don't know what to do." Pt provided with word search and offered puzzle to do. Pt encouraged to do an activity to get his mind off of whatever is going on at home. Pt states, "I can't." Word search left at bedside for patient.

## 2021-06-29 NOTE — ED Notes (Signed)
Pt changed into hospital provided scrubs, all personal belongings removed except for underwear. Belongings inventoried and placed in purple zone locker #1. Valuables provided with security. Security at bedside to want pt.

## 2021-06-29 NOTE — ED Notes (Signed)
Pt given [place] sandwich and water.

## 2021-06-29 NOTE — ED Notes (Signed)
Pt now requesting to leave, walking out of purple zone. EDP in green zone notified, will take out IVC papers now. Pt pacing in purple zone, but is currently redirectable.

## 2021-06-29 NOTE — ED Notes (Signed)
Pt called out stated he didn't want to live anymore becuase he is ready to loose his apartment and can no longer eat food.

## 2021-06-29 NOTE — ED Notes (Signed)
Pt again ambulating out of room, repeating, "Oh, oh, oh." Pt encouraged to go back to room.

## 2021-06-29 NOTE — ED Notes (Signed)
Pt given urinal, aware of need for urine sample. Pt states he is unable to provide one at this time.

## 2021-06-30 DIAGNOSIS — Z765 Malingerer [conscious simulation]: Secondary | ICD-10-CM

## 2021-06-30 DIAGNOSIS — T1491XA Suicide attempt, initial encounter: Secondary | ICD-10-CM

## 2021-06-30 DIAGNOSIS — R45851 Suicidal ideations: Secondary | ICD-10-CM

## 2021-06-30 LAB — BASIC METABOLIC PANEL
Anion gap: 8 (ref 5–15)
BUN: <5 mg/dL — ABNORMAL LOW (ref 8–23)
CO2: 27 mmol/L (ref 22–32)
Calcium: 8.6 mg/dL — ABNORMAL LOW (ref 8.9–10.3)
Chloride: 92 mmol/L — ABNORMAL LOW (ref 98–111)
Creatinine, Ser: 0.81 mg/dL (ref 0.61–1.24)
GFR, Estimated: >60 mL/min (ref >60)
Glucose, Bld: 106 mg/dL — ABNORMAL HIGH (ref 70–99)
Potassium: 3.9 mmol/L (ref 3.5–5.1)
Sodium: 127 mmol/L — ABNORMAL LOW (ref 135–145)

## 2021-06-30 MED ORDER — MENTHOL 3 MG MT LOZG
1.0000 | LOZENGE | OROMUCOSAL | Status: DC | PRN
  Administered 2021-06-30: 3 mg via ORAL
  Filled 2021-06-30: qty 9

## 2021-06-30 MED ORDER — HYDROXYZINE HCL 25 MG PO TABS
25.0000 mg | ORAL_TABLET | Freq: Four times a day (QID) | ORAL | Status: DC | PRN
  Administered 2021-06-30: 25 mg via ORAL
  Filled 2021-06-30: qty 1

## 2021-06-30 MED ORDER — ACETAMINOPHEN 325 MG PO TABS
650.0000 mg | ORAL_TABLET | Freq: Once | ORAL | Status: AC
  Administered 2021-06-30: 650 mg via ORAL
  Filled 2021-06-30: qty 2

## 2021-06-30 NOTE — Consult Note (Signed)
Telepsych Consultation   Reason for Consult:  SI Referring Physician:  Truddie Hidden, MD Location of Patient: [facility] Location of Provider: [facility]  Patient Identification: Anthony Lamb MRN:  [id number] Principal Diagnosis: Suicidal ideations Diagnosis:  Principal Problem:   Suicidal ideations Active Problems:   Malingering   Total Time spent with patient: 20 minutes  Subjective:   Anthony Lamb is a 66 y.o. male patient admitted to the ED via EMS after he made a call and hung up. Per pt when EMS arrived he was trying to cut himself to kill himself and then, tried to swallow a handful of pills but EMTs stopped him.  HPI:  Patient seen and reevaluated via tele health by this provider; chart reviewed and consulted with Dr. Dwyane Dee on 06/30/21. On evaluation JOSHVA LABRECK is sitting upright in bed facing the camera with good eye contact. His thought process is logical, relevant, and future oriented. His speech is clear and coherent. His mood is euthymic and affect is congruent.  Patient immediately states that he wants to go home tomorrow so he can get his money and go to social services to renew his benefits. He states that he gets his money on the first of every month.   When asked if he reported having suicidal thoughts and attempted suicide, he states "I do not want die." When asked why did he reports having suicidal ideations, he states he was worried about losing things.  He states that he will lose his apartment at the end of this month. Today, he denies having suicidal ideations and contracts for safety to return home. He repeatedly asked if he could be discharged tomorrow and not today. When asked why not today and why tomorrow, he states that he needs medications for alcohol withdrawal. He then states that he wants to go home tomorrow because he may hurt himself. When asked how much alcohol does he drink, he states that he drinks a couple cans of beer once in a while. He  reports drinking alcohol since he was younger. BAL neg on arrival. He denies using illicit drugs.  Patient denies HI. Patient denies AVH. There is no objective evidence that the patient is currently responding to internal or external stimuli.   Patient states that he lives alone in his apartment. He reports that he gets support from [employer]. He states that he receives medication management from [facility] and is prescribed Seroquel, Ambien, and Ativan.  Patient has presented to [facility] emergency departments several times with similar presentations that includes seeking benzodiazepines, reporting SI to obtain benzos, and stating he is going to lose his apartment.   Past Psychiatric History: Hx of MDD, GAD and malingering   Risk to Self:  denies  Risk to Others:  denies  Prior Inpatient Therapy:  yes Prior Outpatient Therapy:  yes   Past Medical History:  Past Medical History:  Diagnosis Date   Alcohol abuse, in remission    Anal fissure    Anemia    Ankylosing spondylitis (HCC)    Anxiety    Arthritis    Chronic diarrhea    Chronic headaches    Chronic pain    Colitis    Colon polyps    Depression    Esophagitis    Gastric polyps    hyperplastic and fundic gland   Gastritis    GERD (gastroesophageal reflux disease)    Hypertension    IBS (irritable bowel syndrome)    Poor  dentition    SIADH (syndrome of inappropriate ADH production) [facility])     Past Surgical History:  Procedure Laterality Date   BIOPSY  11/26/2018   Procedure: BIOPSY;  Surgeon: Gatha Mayer, MD;  Location: [facility];  Service: Endoscopy;;   COLONOSCOPY W/ BIOPSIES     COLONOSCOPY WITH PROPOFOL N/A 11/26/2018   Procedure: COLONOSCOPY WITH PROPOFOL;  Surgeon: Gatha Mayer, MD;  Location: [facility];  Service: Endoscopy;  Laterality: N/A;   ESOPHAGOGASTRODUODENOSCOPY     ESOPHAGOGASTRODUODENOSCOPY (EGD) WITH PROPOFOL N/A 11/26/2018   Procedure: ESOPHAGOGASTRODUODENOSCOPY (EGD) WITH  PROPOFOL;  Surgeon: Gatha Mayer, MD;  [facility];  Service: Endoscopy;  Laterality: N/A;   HEMOSTASIS CLIP PLACEMENT  11/26/2018   Procedure: HEMOSTASIS CLIP PLACEMENT;  Surgeon: Gatha Mayer, MD;  [facility];  Service: Endoscopy;;   HERNIA REPAIR Bilateral    HOT HEMOSTASIS N/A 11/26/2018   Procedure: HOT HEMOSTASIS (ARGON PLASMA COAGULATION/BICAP);  Surgeon: Gatha Mayer, MD;  Location: [facility];  Service: Endoscopy;  Laterality: N/A;   OPEN REDUCTION INTERNAL FIXATION (ORIF) DISTAL RADIAL FRACTURE Right 02/11/2018   Procedure: OPEN REDUCTION INTERNAL FIXATION (ORIF) DISTAL RADIAL FRACTURE;  Surgeon: Milly Jakob, MD;  Location: [facility];  Service: Orthopedics;  Laterality: Right;   POLYPECTOMY  11/26/2018   Procedure: POLYPECTOMY;  Surgeon: Gatha Mayer, MD;  Location: [facility];  Service: Endoscopy;;   TONSILLECTOMY     Family History:  Family History  Problem Relation Age of Onset   Anxiety disorder Mother    Congestive Heart Failure Mother    Crohn's disease Mother    Colon cancer Mother 14   Arthritis Father    High blood pressure Father    Crohn's disease Father    Family Psychiatric  History: No hx reported Social History:  Social History   Substance and Sexual Activity  Alcohol Use Not Currently     Social History   Substance and Sexual Activity  Drug Use Not Currently   Types: Marijuana    Social History   Socioeconomic History   Marital status: Single    Spouse name: Not on file   Number of children: 0   Years of education: Not on file   Highest education level: Not on file  Occupational History   Not on file  Tobacco Use   Smoking status: Former    Types: Cigarettes    Quit date: 2014    Years since quitting: 9.0   Smokeless tobacco: Never  Vaping Use   Vaping Use: Never used  Substance and Sexual Activity   Alcohol use: Not Currently   Drug use: Not Currently    Types: Marijuana   Sexual activity: Not  Currently  Other Topics Concern   Not on file  Social History Narrative   HSG. Long - term disability - unable to work. Lived with his mother in her house - she died May 16, 2023 -    Lives in apartment   Has case worker   Social Determinants of Radio broadcast assistant Strain: Not on file  Food Insecurity: Not on file  Transportation Needs: Not on file  Physical Activity: Not on file  Stress: Not on file  Social Connections: Not on file   Additional Social History:    Allergies:   Allergies  Allergen Reactions   Nsaids Other (See Comments)    GI upset- history of peptic ulcers!!   Diphenhydramine Hcl Other (See Comments)    Restlessness  Flexeril [Cyclobenzaprine] Other (See Comments)    Restlessness   Hctz [Hydrochlorothiazide] Other (See Comments)    Caused to lose sodium when taking with Lisinopril   Rexulti [Brexpiprazole] Other (See Comments)    "Made me not feel right- restless"   Seroquel [Quetiapine] Other (See Comments)    Restless legs and makes the patient sweat- also does not help patient's insomnia. Pt takes this at home   Gabapentin Diarrhea   Hydroxyzine Other (See Comments)    Restlessness    Labs:  Results for orders placed or performed during the hospital encounter of 06/29/21 (from the past 48 hour(s))  CBC with Differential     Status: Abnormal   Collection Time: 06/29/21  8:53 AM  Result Value Ref Range   WBC 6.8 4.0 - 10.5 K/uL   RBC 4.19 (L) 4.22 - 5.81 MIL/uL   Hemoglobin 13.1 13.0 - 17.0 g/dL   HCT 38.2 (L) 39.0 - 52.0 %   MCV 91.2 80.0 - 100.0 fL   MCH 31.3 26.0 - 34.0 pg   MCHC 34.3 30.0 - 36.0 g/dL   RDW 12.8 11.5 - 15.5 %   Platelets 267 150 - 400 K/uL   nRBC 0.0 0.0 - 0.2 %   Neutrophils Relative % 77 %   Neutro Abs 5.2 1.7 - 7.7 K/uL   Lymphocytes Relative 11 %   Lymphs Abs 0.8 0.7 - 4.0 K/uL   Monocytes Relative 12 %   Monocytes Absolute 0.8 0.1 - 1.0 K/uL   Eosinophils Relative 0 %   Eosinophils Absolute 0.0 0.0 - 0.5 K/uL    Basophils Relative 0 %   Basophils Absolute 0.0 0.0 - 0.1 K/uL   Immature Granulocytes 0 %   Abs Immature Granulocytes 0.02 0.00 - 0.07 K/uL    Comment: Performed at [facility], [address], [place], [place] [address]  Ethanol     Status: None   Collection Time: 06/29/21  8:53 AM  Result Value Ref Range   Alcohol, Ethyl (B) <10 <10 mg/dL    Comment: (NOTE) Lowest detectable limit for serum alcohol is 10 mg/dL.  For medical purposes only. Performed at [facility], [place] [address], [place], [place] [address]   Comprehensive metabolic panel     Status: Abnormal   Collection Time: 06/29/21  8:53 AM  Result Value Ref Range   Sodium 129 (L) 135 - 145 mmol/L   Potassium 3.9 3.5 - 5.1 mmol/L   Chloride 93 (L) 98 - 111 mmol/L   CO2 24 22 - 32 mmol/L   Glucose, Bld 99 70 - 99 mg/dL    Comment: Glucose reference range applies only to samples taken after fasting for at least 8 hours.   BUN <5 (L) 8 - 23 mg/dL   Creatinine, Ser 0.77 0.61 - 1.24 mg/dL   Calcium 9.0 8.9 - 10.3 mg/dL   Total Protein 6.2 (L) 6.5 - 8.1 g/dL   Albumin 3.8 3.5 - 5.0 g/dL   AST 18 15 - 41 U/L   ALT 13 0 - 44 U/L   Alkaline Phosphatase 65 38 - 126 U/L   Total Bilirubin 0.5 0.3 - 1.2 mg/dL   GFR, Estimated >60 >60 mL/min    Comment: (NOTE) Calculated using the CKD-EPI Creatinine Equation (2021)    Anion gap 12 5 - 15    Comment: Performed at [facility] [address] [place], [place] [address]  Resp Panel by RT-PCR (Flu A&B, Covid)  Nasopharyngeal Swab     Status: None   Collection Time: 06/29/21  8:55 AM   Specimen: Nasopharyngeal Swab; Nasopharyngeal(NP) swabs in vial transport medium  Result Value Ref Range   SARS Coronavirus 2 by RT PCR NEGATIVE NEGATIVE    Comment: (NOTE) SARS-CoV-2 target nucleic acids are NOT DETECTED.  The SARS-CoV-2 RNA is generally detectable in upper respiratory specimens during the acute phase of infection. The lowest concentration of SARS-CoV-2  viral copies this assay can detect is 138 copies/mL. A negative result does not preclude SARS-Cov-2 infection and should not be used as the sole basis for treatment or other patient management decisions. A negative result may occur with  improper specimen collection/handling, submission of specimen other than nasopharyngeal swab, presence of viral mutation(s) within the areas targeted by this assay, and inadequate number of viral copies(<138 copies/mL). A negative result must be combined with clinical observations, patient history, and epidemiological information. The expected result is Negative.  Fact Sheet for Patients:  EntrepreneurPulse.com.au  Fact Sheet for Healthcare Providers:  IncredibleEmployment.be  This test is no t yet approved or cleared by the [place] FDA and  has been authorized for detection and/or diagnosis of SARS-CoV-2 by FDA under an Emergency Use Authorization (EUA). This EUA will remain  in effect (meaning this test can be used) for the duration of the COVID-19 declaration under Section 564(b)(1) of the Act, 21 U.S.C.section 360bbb-3(b)(1), unless the authorization is terminated  or revoked sooner.       Influenza A by PCR NEGATIVE NEGATIVE   Influenza B by PCR NEGATIVE NEGATIVE    Comment: (NOTE) The Xpert Xpress SARS-CoV-2/FLU/RSV plus assay is intended as an aid in the diagnosis of influenza from Nasopharyngeal swab specimens and should not be used as a sole basis for treatment. Nasal washings and aspirates are unacceptable for Xpert Xpress SARS-CoV-2/FLU/RSV testing.  Fact Sheet for Patients: EntrepreneurPulse.com.au  Fact Sheet for Healthcare Providers: IncredibleEmployment.be  This test is not yet approved or cleared by the [place] FDA and has been authorized for detection and/or diagnosis of SARS-CoV-2 by FDA under an Emergency Use Authorization (EUA). This EUA  will remain in effect (meaning this test can be used) for the duration of the COVID-19 declaration under Section 564(b)(1) of the Act, 21 U.S.C. section 360bbb-3(b)(1), unless the authorization is terminated or revoked.  Performed at [facility], [place] [address], [place], [place] [address]   Urine rapid drug screen (hosp performed)     Status: Abnormal   Collection Time: 06/29/21  4:02 PM  Result Value Ref Range   Opiates NONE DETECTED NONE DETECTED   Cocaine NONE DETECTED NONE DETECTED   Benzodiazepines POSITIVE (A) NONE DETECTED   Amphetamines NONE DETECTED NONE DETECTED   Tetrahydrocannabinol NONE DETECTED NONE DETECTED   Barbiturates NONE DETECTED NONE DETECTED    Comment: (NOTE) DRUG SCREEN FOR MEDICAL PURPOSES ONLY.  IF CONFIRMATION IS NEEDED FOR ANY PURPOSE, NOTIFY LAB WITHIN 5 DAYS.  LOWEST DETECTABLE LIMITS FOR URINE DRUG SCREEN Drug Class                     Cutoff (ng/mL) Amphetamine and metabolites    1000 Barbiturate and metabolites    200 Benzodiazepine                 834 Tricyclics and metabolites     300 Opiates and metabolites        300 Cocaine and metabolites  300 THC                            50 Performed at [facility], [place] [address], [place], [place] [address]     Medications:  Current Facility-Administered Medications  Medication Dose Route Frequency Provider Last Rate Last Admin   hydrOXYzine (ATARAX) tablet 25 mg  25 mg Oral Q6H PRN Daleen Bo, MD   25 mg at 06/30/21 0951   lisinopril (ZESTRIL) tablet 10 mg  10 mg Oral QHS Drenda Freeze, MD   10 mg at 06/29/21 2156   menthol-cetylpyridinium (CEPACOL) lozenge 3 mg  1 lozenge Oral PRN Delora Fuel, MD   3 mg at 06/30/21 0855   pantoprazole (PROTONIX) EC tablet 40 mg  40 mg Oral Daily Drenda Freeze, MD   40 mg at 06/30/21 0855   propranolol (INDERAL) tablet 10 mg  10 mg Oral BID Drenda Freeze, MD   10 mg at 06/30/21 3734   QUEtiapine (SEROQUEL) tablet 300 mg   300 mg Oral QHS Drenda Freeze, MD   300 mg at 06/29/21 2156   risperiDONE (RISPERDAL) tablet 0.5 mg  0.5 mg Oral BID Drenda Freeze, MD   0.5 mg at 06/30/21 2876   sucralfate (CARAFATE) tablet 1 g  1 g Oral QID Drenda Freeze, MD   1 g at 06/30/21 8115   tiZANidine (ZANAFLEX) tablet 2 mg  2 mg Oral TID PRN Drenda Freeze, MD   2 mg at 06/30/21 0244   traZODone (DESYREL) tablet 50 mg  50 mg Oral QHS Drenda Freeze, MD   50 mg at 06/29/21 2156   zolpidem (AMBIEN) tablet 10 mg  10 mg Oral QHS Drenda Freeze, MD   10 mg at 06/29/21 2156   Current Outpatient Medications  Medication Sig Dispense Refill   diazepam (VALIUM) 5 MG tablet Take 1 tablet (5 mg total) by mouth 2 (two) times daily. 10 tablet 0   diclofenac Sodium (VOLTAREN) 1 % GEL Apply 2 g topically 4 (four) times daily as needed (to painful sites).     FLUoxetine (PROZAC) 20 MG capsule Take 1 capsule (20 mg total) by mouth daily. (Patient not taking: Reported on 06/14/2021) 30 capsule 3   fluticasone (FLONASE) 50 MCG/ACT nasal spray Place 1 spray into both nostrils daily for 7 days. 15.8 mL 0   lisinopril (ZESTRIL) 10 MG tablet Take 10 mg by mouth at bedtime.     meloxicam (MOBIC) 15 MG tablet Take 15 mg by mouth daily.     Multiple Vitamin (MULTIVITAMIN WITH MINERALS) TABS tablet Take 1 tablet by mouth daily. (May buy over the counter) (Patient taking differently: Take 1 tablet by mouth daily.) 1 tablet 0   pantoprazole (PROTONIX) 40 MG tablet Take 1 tablet (40 mg total) by mouth daily. (Patient taking differently: Take 40 mg by mouth at bedtime.) 30 tablet 11   propranolol (INDERAL) 10 MG tablet Take 1 tablet (10 mg total) by mouth 2 (two) times daily. 60 tablet 3   QUEtiapine (SEROQUEL) 300 MG tablet Take 1 tablet (300 mg total) by mouth at bedtime. 30 tablet 3   REXULTI 2 MG TABS tablet Take 2 mg by mouth daily.     risperiDONE (RISPERDAL) 0.5 MG tablet Take 1 tablet (0.5 mg total) by mouth 2 (two) times daily.  60 tablet 3   sucralfate (CARAFATE) 1 g tablet Take 1 g  by mouth 4 (four) times daily.     tiZANidine (ZANAFLEX) 4 MG tablet Take 2 mg by mouth 3 (three) times daily as needed for pain.     traZODone (DESYREL) 50 MG tablet Take 1 tablet (50 mg total) by mouth at bedtime. 30 tablet 3   verapamil (CALAN-SR) 240 MG CR tablet Take 240 mg by mouth at bedtime.     zolpidem (AMBIEN) 10 MG tablet Take 1 tablet (10 mg total) by mouth at bedtime. 30 tablet 0    Psychiatric Specialty Exam:  Presentation  General Appearance: Appropriate for Environment  Eye Contact:Fair  Speech:Clear and Coherent  Speech Volume:Normal  Handedness:Right   Mood and Affect  Mood:Euthymic  Affect:Appropriate   Thought Process  Thought Processes:Coherent; Goal Directed  Descriptions of Associations:Intact  Orientation:Full (Time, Place and Person)  Thought Content:Logical  History of Schizophrenia/Schizoaffective disorder:No  Duration of Psychotic Symptoms:Less than six months  Hallucinations:Hallucinations: None  Ideas of Reference:None  Suicidal Thoughts:Suicidal Thoughts: No  Homicidal Thoughts:Homicidal Thoughts: No   Sensorium  Memory:Immediate Good; Recent Good; Remote Good  Judgment:Intact  Insight:Lacking; Present   Executive Functions  Concentration:Fair  Attention Span:Fair  [place]   Psychomotor Activity  Psychomotor Activity:Psychomotor Activity: Normal   Assets  Assets:Communication Skills; Desire for Improvement; Financial Resources/Insurance; Housing; Social Support; Leisure Time   Sleep  Sleep:Sleep: Fair    Physical Exam: Physical Exam Constitutional:      Appearance: Normal appearance.  Cardiovascular:     Rate and Rhythm: Normal rate.  Pulmonary:     Effort: Pulmonary effort is normal.  Neurological:     Mental Status: He is alert and oriented to person, place, and time.   Review of Systems   Constitutional: Negative.   HENT: Negative.    Eyes: Negative.   Respiratory: Negative.    Cardiovascular: Negative.   Gastrointestinal: Negative.   Genitourinary: Negative.   Musculoskeletal: Negative.   Skin: Negative.   Neurological: Negative.   Endo/Heme/Allergies: Negative.   Blood pressure 117/72, pulse 74, temperature 98.1 F (36.7 C), temperature source Oral, resp. rate 16, height 5' 10"  (1.778 m), weight 63.5 kg, SpO2 97 %. Body mass index is 20.09 kg/m.  Treatment Plan Summary: For your behavioral health needs you are advised to continue treatment with your current outpatient providers:       [facility].      [place], [place] [address]      [phone]       [facility]      [address], [place], [place] [address]      [phone]   Disposition: No evidence of imminent risk to self or others at present.   Patient does not meet criteria for psychiatric inpatient admission. Supportive therapy provided about ongoing stressors. Discussed crisis plan, support from social network, calling 911, coming to the Emergency Department, and calling Suicide Hotline.  This service was provided via telemedicine using a 2-way, interactive audio and video technology.  Names of all persons participating in this telemedicine service and their role in this encounter. Name: Amedeo Plenty  Role: Patient   Name: Darrol Angel Role: NP  Name:  Role:   Name:  Role:    A secure chat sent to Dr. Eulis Foster and Lysbeth Galas, RN., with the stated treatment plan and disposition.   Marissa Calamity, NP 06/30/2021 12:49 PM

## 2021-06-30 NOTE — Discharge Instructions (Signed)
For your behavioral health needs you are advised to continue treatment with your current outpatient providers:       [facility]      [place], [place] [address]      [phone]       [facility]      [address] [place]      [place], [place] [address]      [phone]

## 2021-06-30 NOTE — BH Assessment (Signed)
[facility] Assessment Progress Note   Per Darrol Angel, NP, this pt does not require psychiatric hospitalization at this time.  Pt is psychiatrically cleared.  Discharge instructions advise pt to continue treatment with his current providers: [facility] for psychiatry and [facility] [employer].  At Patrice's request, this writer called [facility] at 12:56 to notify them of disposition and to ask if they can pick him up.  Call rolled to voice mail and I left a message; return call is pending as of this writing.  Patrice and pt's nurse, Lysbeth Galas, have been notified.  Jalene Mullet, [facility] Triage Specialist [phone]

## 2021-06-30 NOTE — ED Notes (Signed)
Pt has been up to the desk multiple times today, with a steady gait, complaining of feeling dizzy and saying that he is going to fall once he goes home. Pt has been told multiple times to go back into his room to sit down.

## 2021-06-30 NOTE — ED Notes (Signed)
Pt ambulating out of room asking for some cough medication. He states, "My throat has been hurting and I have been coughing." This RN notified an MD and asked for an order.

## 2021-06-30 NOTE — ED Notes (Signed)
Pt given clothes and all belongings from locker and security. Pt given discharge paperwork and notified to speak with ACT team and doctors office tomorrow am Pt given 2 [place] sandwiches and 2 sprites with crackers and cheese. Called [employer] for pt to get home Pt seen getting in to taxi with no problem

## 2021-06-30 NOTE — ED Provider Notes (Signed)
Emergency Medicine Observation Re-evaluation Note  Anthony Lamb is a 66 y.o. male, seen on rounds today.  Pt initially presented to the ED for complaints of Medical Clearance and Suicidal Currently, the patient is resting comfortably.  Physical Exam  BP 117/72 (BP Location: Right Arm)    Pulse 74    Temp 98.1 F (36.7 C) (Oral)    Resp 16    Ht 5' 10"  (1.778 m)    Wt 63.5 kg    SpO2 97%    BMI 20.09 kg/m  Physical Exam General: Nontoxic appearing Cardiac: Normal heart rate Lungs: Normal respiratory rate Psych: Not responding to internal stimuli  ED Course / MDM  EKG:   I have reviewed the labs performed to date as well as medications administered while in observation.  Recent changes in the last 24 hours include he remains cooperative.  Pharmacy technician reconciled medications and he is refilling his prescriptions regularly at his pharmacy.  Plan  Current plan is for discharge home with outpatient management by his care team, and routine follow-up care.  Anthony Lamb is not under involuntary commitment.     Daleen Bo, MD 06/30/21 [phone]

## 2021-07-01 ENCOUNTER — Inpatient Hospital Stay (HOSPITAL_COMMUNITY)
Admission: EM | Admit: 2021-07-01 | Discharge: 2021-08-22 | DRG: 917 | Disposition: A | Discharge status: Home / Self Care | Payer: Medicare Other | Attending: Internal Medicine | Admitting: Internal Medicine

## 2021-07-01 ENCOUNTER — Encounter (HOSPITAL_COMMUNITY): Payer: Self-pay | Admitting: *Deleted

## 2021-07-01 ENCOUNTER — Other Ambulatory Visit: Payer: Self-pay

## 2021-07-01 ENCOUNTER — Observation Stay (HOSPITAL_COMMUNITY): Payer: Medicare Other

## 2021-07-01 DIAGNOSIS — M542 Cervicalgia: Secondary | ICD-10-CM | POA: Diagnosis present

## 2021-07-01 DIAGNOSIS — Z87891 Personal history of nicotine dependence: Secondary | ICD-10-CM

## 2021-07-01 DIAGNOSIS — R079 Chest pain, unspecified: Secondary | ICD-10-CM | POA: Diagnosis not present

## 2021-07-01 DIAGNOSIS — F418 Other specified anxiety disorders: Secondary | ICD-10-CM | POA: Diagnosis present

## 2021-07-01 DIAGNOSIS — Z8249 Family history of ischemic heart disease and other diseases of the circulatory system: Secondary | ICD-10-CM

## 2021-07-01 DIAGNOSIS — K219 Gastro-esophageal reflux disease without esophagitis: Secondary | ICD-10-CM | POA: Diagnosis present

## 2021-07-01 DIAGNOSIS — L299 Pruritus, unspecified: Secondary | ICD-10-CM | POA: Diagnosis not present

## 2021-07-01 DIAGNOSIS — T7840XA Allergy, unspecified, initial encounter: Secondary | ICD-10-CM | POA: Diagnosis not present

## 2021-07-01 DIAGNOSIS — T43202A Poisoning by unspecified antidepressants, intentional self-harm, initial encounter: Secondary | ICD-10-CM | POA: Diagnosis not present

## 2021-07-01 DIAGNOSIS — T50902A Poisoning by unspecified drugs, medicaments and biological substances, intentional self-harm, initial encounter: Secondary | ICD-10-CM | POA: Insufficient documentation

## 2021-07-01 DIAGNOSIS — T1491XA Suicide attempt, initial encounter: Secondary | ICD-10-CM | POA: Diagnosis not present

## 2021-07-01 DIAGNOSIS — F13280 Sedative, hypnotic or anxiolytic dependence with sedative, hypnotic or anxiolytic-induced anxiety disorder: Secondary | ICD-10-CM | POA: Diagnosis present

## 2021-07-01 DIAGNOSIS — E876 Hypokalemia: Secondary | ICD-10-CM | POA: Diagnosis not present

## 2021-07-01 DIAGNOSIS — E871 Hypo-osmolality and hyponatremia: Secondary | ICD-10-CM | POA: Diagnosis present

## 2021-07-01 DIAGNOSIS — F1011 Alcohol abuse, in remission: Secondary | ICD-10-CM | POA: Diagnosis present

## 2021-07-01 DIAGNOSIS — E222 Syndrome of inappropriate secretion of antidiuretic hormone: Secondary | ICD-10-CM | POA: Diagnosis present

## 2021-07-01 DIAGNOSIS — J181 Lobar pneumonia, unspecified organism: Secondary | ICD-10-CM | POA: Diagnosis not present

## 2021-07-01 DIAGNOSIS — M1990 Unspecified osteoarthritis, unspecified site: Secondary | ICD-10-CM | POA: Diagnosis present

## 2021-07-01 DIAGNOSIS — Z79891 Long term (current) use of opiate analgesic: Secondary | ICD-10-CM

## 2021-07-01 DIAGNOSIS — I10 Essential (primary) hypertension: Secondary | ICD-10-CM | POA: Diagnosis present

## 2021-07-01 DIAGNOSIS — Z888 Allergy status to other drugs, medicaments and biological substances status: Secondary | ICD-10-CM

## 2021-07-01 DIAGNOSIS — R45851 Suicidal ideations: Secondary | ICD-10-CM

## 2021-07-01 DIAGNOSIS — G894 Chronic pain syndrome: Secondary | ICD-10-CM | POA: Diagnosis present

## 2021-07-01 DIAGNOSIS — U071 COVID-19: Secondary | ICD-10-CM | POA: Diagnosis present

## 2021-07-01 DIAGNOSIS — K589 Irritable bowel syndrome without diarrhea: Secondary | ICD-10-CM | POA: Diagnosis present

## 2021-07-01 DIAGNOSIS — G4700 Insomnia, unspecified: Secondary | ICD-10-CM | POA: Diagnosis not present

## 2021-07-01 DIAGNOSIS — Z79899 Other long term (current) drug therapy: Secondary | ICD-10-CM

## 2021-07-01 DIAGNOSIS — R251 Tremor, unspecified: Secondary | ICD-10-CM | POA: Diagnosis present

## 2021-07-01 DIAGNOSIS — T43201A Poisoning by unspecified antidepressants, accidental (unintentional), initial encounter: Secondary | ICD-10-CM

## 2021-07-01 DIAGNOSIS — Z818 Family history of other mental and behavioral disorders: Secondary | ICD-10-CM

## 2021-07-01 DIAGNOSIS — R109 Unspecified abdominal pain: Secondary | ICD-10-CM

## 2021-07-01 DIAGNOSIS — T50901A Poisoning by unspecified drugs, medicaments and biological substances, accidental (unintentional), initial encounter: Secondary | ICD-10-CM | POA: Diagnosis present

## 2021-07-01 DIAGNOSIS — T43212A Poisoning by selective serotonin and norepinephrine reuptake inhibitors, intentional self-harm, initial encounter: Secondary | ICD-10-CM | POA: Diagnosis not present

## 2021-07-01 DIAGNOSIS — E860 Dehydration: Secondary | ICD-10-CM | POA: Diagnosis present

## 2021-07-01 DIAGNOSIS — F411 Generalized anxiety disorder: Secondary | ICD-10-CM | POA: Diagnosis present

## 2021-07-01 DIAGNOSIS — M459 Ankylosing spondylitis of unspecified sites in spine: Secondary | ICD-10-CM | POA: Diagnosis present

## 2021-07-01 DIAGNOSIS — R042 Hemoptysis: Secondary | ICD-10-CM | POA: Diagnosis not present

## 2021-07-01 DIAGNOSIS — R21 Rash and other nonspecific skin eruption: Secondary | ICD-10-CM | POA: Diagnosis not present

## 2021-07-01 DIAGNOSIS — Z23 Encounter for immunization: Secondary | ICD-10-CM

## 2021-07-01 DIAGNOSIS — J189 Pneumonia, unspecified organism: Secondary | ICD-10-CM

## 2021-07-01 DIAGNOSIS — I951 Orthostatic hypotension: Secondary | ICD-10-CM | POA: Diagnosis present

## 2021-07-01 DIAGNOSIS — R0789 Other chest pain: Secondary | ICD-10-CM | POA: Diagnosis not present

## 2021-07-01 DIAGNOSIS — F339 Major depressive disorder, recurrent, unspecified: Secondary | ICD-10-CM | POA: Diagnosis present

## 2021-07-01 DIAGNOSIS — R509 Fever, unspecified: Secondary | ICD-10-CM

## 2021-07-01 DIAGNOSIS — F1010 Alcohol abuse, uncomplicated: Secondary | ICD-10-CM | POA: Diagnosis present

## 2021-07-01 DIAGNOSIS — F1320 Sedative, hypnotic or anxiolytic dependence, uncomplicated: Secondary | ICD-10-CM | POA: Diagnosis present

## 2021-07-01 LAB — CBC
HCT: 43.2 % (ref 39.0–52.0)
Hemoglobin: 14.6 g/dL (ref 13.0–17.0)
MCH: 31.5 pg (ref 26.0–34.0)
MCHC: 33.8 g/dL (ref 30.0–36.0)
MCV: 93.1 fL (ref 80.0–100.0)
Platelets: 290 10*3/uL (ref 150–400)
RBC: 4.64 MIL/uL (ref 4.22–5.81)
RDW: 12.9 % (ref 11.5–15.5)
WBC: 8.1 10*3/uL (ref 4.0–10.5)
nRBC: 0 % (ref 0.0–0.2)

## 2021-07-01 LAB — BASIC METABOLIC PANEL
Anion gap: 10 (ref 5–15)
BUN: 7 mg/dL — ABNORMAL LOW (ref 8–23)
CO2: 23 mmol/L (ref 22–32)
Calcium: 8 mg/dL — ABNORMAL LOW (ref 8.9–10.3)
Chloride: 88 mmol/L — ABNORMAL LOW (ref 98–111)
Creatinine, Ser: 0.61 mg/dL (ref 0.61–1.24)
GFR, Estimated: >60 mL/min (ref >60)
Glucose, Bld: 97 mg/dL (ref 70–99)
Potassium: 3.6 mmol/L (ref 3.5–5.1)
Sodium: 121 mmol/L — ABNORMAL LOW (ref 135–145)

## 2021-07-01 LAB — RAPID URINE DRUG SCREEN, HOSP PERFORMED
Amphetamines: NOT DETECTED
Barbiturates: NOT DETECTED
Benzodiazepines: POSITIVE — AB
Cocaine: NOT DETECTED
Opiates: NOT DETECTED
Tetrahydrocannabinol: NOT DETECTED

## 2021-07-01 LAB — COMPREHENSIVE METABOLIC PANEL
ALT: 14 U/L (ref 0–44)
AST: 19 U/L (ref 15–41)
Albumin: 4.6 g/dL (ref 3.5–5.0)
Alkaline Phosphatase: 76 U/L (ref 38–126)
Anion gap: 9 (ref 5–15)
BUN: 6 mg/dL — ABNORMAL LOW (ref 8–23)
CO2: 27 mmol/L (ref 22–32)
Calcium: 9.3 mg/dL (ref 8.9–10.3)
Chloride: 91 mmol/L — ABNORMAL LOW (ref 98–111)
Creatinine, Ser: 0.69 mg/dL (ref 0.61–1.24)
GFR, Estimated: >60 mL/min (ref >60)
Glucose, Bld: 101 mg/dL — ABNORMAL HIGH (ref 70–99)
Potassium: 3.6 mmol/L (ref 3.5–5.1)
Sodium: 127 mmol/L — ABNORMAL LOW (ref 135–145)
Total Bilirubin: 0.8 mg/dL (ref 0.3–1.2)
Total Protein: 7.6 g/dL (ref 6.5–8.1)

## 2021-07-01 LAB — ETHANOL: Alcohol, Ethyl (B): <10 mg/dL (ref <10)

## 2021-07-01 LAB — RESP PANEL BY RT-PCR (FLU A&B, COVID) ARPGX2
Influenza A by PCR: NEGATIVE
Influenza B by PCR: NEGATIVE
SARS Coronavirus 2 by RT PCR: POSITIVE — AB

## 2021-07-01 LAB — TSH: TSH: 0.498 u[IU]/mL (ref 0.350–4.500)

## 2021-07-01 LAB — ACETAMINOPHEN LEVEL
Acetaminophen (Tylenol), Serum: <10 ug/mL — ABNORMAL LOW (ref 10–30)
Acetaminophen (Tylenol), Serum: <10 ug/mL — ABNORMAL LOW (ref 10–30)

## 2021-07-01 LAB — MAGNESIUM: Magnesium: 2.2 mg/dL (ref 1.7–2.4)

## 2021-07-01 LAB — SALICYLATE LEVEL: Salicylate Lvl: <7 mg/dL — ABNORMAL LOW (ref 7.0–30.0)

## 2021-07-01 MED ORDER — NIRMATRELVIR/RITONAVIR (PAXLOVID)TABLET: 3.0000 | ORAL_TABLET | Freq: Two times a day (BID) | ORAL | Status: DC

## 2021-07-01 MED ORDER — QUETIAPINE FUMARATE 100 MG PO TABS
300.0000 mg | ORAL_TABLET | Freq: Every day | ORAL | Status: DC
  Administered 2021-07-01 – 2021-07-02 (×2): 300 mg via ORAL
  Filled 2021-07-01 (×3): qty 1

## 2021-07-01 MED ORDER — ZOLPIDEM TARTRATE 5 MG PO TABS
5.0000 mg | ORAL_TABLET | Freq: Every day | ORAL | Status: DC
  Administered 2021-07-01 – 2021-08-21 (×52): 5 mg via ORAL
  Filled 2021-07-01 (×52): qty 1

## 2021-07-01 MED ORDER — PROCHLORPERAZINE EDISYLATE 10 MG/2ML IJ SOLN
5.0000 mg | Freq: Four times a day (QID) | INTRAMUSCULAR | Status: DC | PRN
  Administered 2021-07-01 – 2021-07-15 (×11): 5 mg via INTRAVENOUS
  Filled 2021-07-01 (×11): qty 2

## 2021-07-01 MED ORDER — ENOXAPARIN SODIUM 40 MG/0.4ML IJ SOSY
40.0000 mg | PREFILLED_SYRINGE | INTRAMUSCULAR | Status: DC
  Administered 2021-07-01 – 2021-08-21 (×52): 40 mg via SUBCUTANEOUS
  Filled 2021-07-01 (×52): qty 0.4

## 2021-07-01 MED ORDER — MOLNUPIRAVIR EUA 200MG CAPSULE
4.0000 | ORAL_CAPSULE | Freq: Two times a day (BID) | ORAL | Status: AC
  Administered 2021-07-01 – 2021-07-06 (×9): 800 mg via ORAL
  Filled 2021-07-01: qty 4

## 2021-07-01 MED ORDER — PANTOPRAZOLE SODIUM 40 MG PO TBEC
40.0000 mg | DELAYED_RELEASE_TABLET | Freq: Every day | ORAL | Status: DC
  Administered 2021-07-02 – 2021-07-31 (×30): 40 mg via ORAL
  Filled 2021-07-01 (×30): qty 1

## 2021-07-01 MED ORDER — LORAZEPAM 2 MG/ML IJ SOLN: 1.0000 mg | Freq: Once | INTRAMUSCULAR | Status: DC

## 2021-07-01 MED ORDER — SUCRALFATE 1 G PO TABS
1.0000 g | ORAL_TABLET | Freq: Four times a day (QID) | ORAL | Status: DC
  Administered 2021-07-01 – 2021-08-22 (×201): 1 g via ORAL
  Filled 2021-07-01 (×205): qty 1

## 2021-07-01 MED ORDER — LORAZEPAM 2 MG/ML IJ SOLN
2.0000 mg | Freq: Once | INTRAMUSCULAR | Status: AC
Start: 2021-07-01 — End: 2021-07-01
  Administered 2021-07-01: 2 mg via INTRAVENOUS
  Filled 2021-07-01: qty 1

## 2021-07-01 MED ORDER — SODIUM CHLORIDE 0.9 % IV BOLUS
1000.0000 mL | Freq: Once | INTRAVENOUS | Status: AC
Start: 2021-07-01 — End: 2021-07-01
  Administered 2021-07-01: 1000 mL via INTRAVENOUS

## 2021-07-01 MED ORDER — MIDODRINE HCL 5 MG PO TABS: 10.0000 mg | ORAL_TABLET | Freq: Three times a day (TID) | ORAL | Status: DC

## 2021-07-01 MED ORDER — FENTANYL CITRATE PF 50 MCG/ML IJ SOSY
25.0000 ug | PREFILLED_SYRINGE | Freq: Once | INTRAMUSCULAR | Status: AC
  Administered 2021-07-01: 25 ug via INTRAVENOUS
  Filled 2021-07-01: qty 1

## 2021-07-01 MED ORDER — LORAZEPAM 2 MG/ML IJ SOLN
2.0000 mg | Freq: Once | INTRAMUSCULAR | Status: AC
  Administered 2021-07-01: 2 mg via INTRAVENOUS
  Filled 2021-07-01: qty 1

## 2021-07-01 MED ORDER — ACETAMINOPHEN 325 MG PO TABS
650.0000 mg | ORAL_TABLET | Freq: Four times a day (QID) | ORAL | Status: DC | PRN
  Administered 2021-07-01 – 2021-07-05 (×5): 650 mg via ORAL
  Filled 2021-07-01 (×5): qty 2

## 2021-07-01 MED ORDER — LORAZEPAM 1 MG PO TABS
1.0000 mg | ORAL_TABLET | Freq: Three times a day (TID) | ORAL | Status: DC
  Administered 2021-07-01 – 2021-07-10 (×26): 1 mg via ORAL
  Filled 2021-07-01 (×26): qty 1

## 2021-07-01 MED ORDER — POTASSIUM CHLORIDE IN NACL 20-0.9 MEQ/L-% IV SOLN
INTRAVENOUS | Status: AC
  Filled 2021-07-01 (×3): qty 1000

## 2021-07-01 MED ORDER — ACETAMINOPHEN 650 MG RE SUPP: 650.0000 mg | Freq: Four times a day (QID) | RECTAL | Status: DC | PRN

## 2021-07-01 MED ORDER — ZOLPIDEM TARTRATE 5 MG PO TABS: 10.0000 mg | ORAL_TABLET | Freq: Every day | ORAL | Status: DC

## 2021-07-01 NOTE — ED Notes (Signed)
Pt keeps repeating he is going to pass out; pt states he needs to have a BM; pt assisted to [facility] and back to bed without success of BM;

## 2021-07-01 NOTE — ED Notes (Signed)
Pt was at the food of the bed this morning say he got up to see if he was going to pass out.  He said he shouldn't of taken those pills and thinks he is going to never walk again and die.  This writer put pt back in bed and hooked up back up to the monitor.

## 2021-07-01 NOTE — ED Triage Notes (Signed)
Pt BIB by [facility] for c/o of taking approximately 60 duloxetine; pt admits to taking them at 0545; pt states he wants to die cause he has a lot of problems coming up and states he is going to be homeless  EMS vitals 134/70, respirations 20, SPO2 99 Pulse 84 Cbg 119

## 2021-07-01 NOTE — ED Provider Notes (Signed)
[facility] DEPT Provider Note   CSN: [id number] Arrival date & time: 07/01/21  4098     History  Chief Complaint  Patient presents with   Drug Overdose    Anthony Lamb is a 66 y.o. male.  The history is provided by the patient and medical records. No language interpreter was used.  Drug Overdose This is a new problem. The current episode started 1 to 2 hours ago. The problem occurs constantly. The problem has not changed since onset.Pertinent negatives include no chest pain, no abdominal pain, no headaches and no shortness of breath. Nothing aggravates the symptoms. Nothing relieves the symptoms. He has tried nothing for the symptoms. The treatment provided no relief.      Home Medications Prior to Admission medications   Medication Sig Start Date End Date Taking? Authorizing Provider  acetaminophen (TYLENOL) 500 MG tablet Take 500 mg by mouth every 6 (six) hours as needed for mild pain.    [provider]  diazepam (VALIUM) 5 MG tablet Take 1 tablet (5 mg total) by mouth 2 (two) times daily. Patient not taking: Reported on 06/30/2021 06/10/21   Carmin Muskrat, MD  FLUoxetine (PROZAC) 20 MG capsule Take 1 capsule (20 mg total) by mouth daily. Patient not taking: Reported on 06/30/2021 05/21/21   Parks Ranger, DO  fluticasone [facility]) 50 MCG/ACT nasal spray Place 1 spray into both nostrils daily for 7 days. Patient taking differently: Place 1-2 sprays into both nostrils daily as needed for allergies or rhinitis. 06/24/21 07/01/21  Wynona Dove A, DO  lisinopril (ZESTRIL) 10 MG tablet Take 10 mg by mouth at bedtime. 06/05/21   [provider]  LORazepam (ATIVAN) 1 MG tablet Take 1 mg by mouth every 8 (eight) hours.    [provider]  Multiple Vitamin (MULTIVITAMIN WITH MINERALS) TABS tablet Take 1 tablet by mouth daily. (May buy over the counter) Patient taking differently: Take 1 tablet by mouth daily. 06/28/18    Connye Burkitt, NP  ondansetron (ZOFRAN-ODT) 4 MG disintegrating tablet Take 4 mg by mouth every 8 (eight) hours as needed for nausea or vomiting (DISSOLVE ORALLY).    [provider]  pantoprazole (PROTONIX) 40 MG tablet Take 1 tablet (40 mg total) by mouth daily. Patient taking differently: Take 40 mg by mouth daily before breakfast. 04/24/14   Janith Lima, MD  propranolol (INDERAL) 10 MG tablet Take 1 tablet (10 mg total) by mouth 2 (two) times daily. 05/20/21   Parks Ranger, DO  QUEtiapine (SEROQUEL) 300 MG tablet Take 1 tablet (300 mg total) by mouth at bedtime. 05/20/21   Parks Ranger, DO  risperiDONE (RISPERDAL) 0.5 MG tablet Take 1 tablet (0.5 mg total) by mouth 2 (two) times daily. Patient not taking: Reported on 06/30/2021 05/20/21   Parks Ranger, DO  sucralfate (CARAFATE) 1 g tablet Take 1 g by mouth 4 (four) times daily. 06/05/21   [provider]  tiZANidine (ZANAFLEX) 4 MG tablet Take 2 mg by mouth every 8 (eight) hours as needed for muscle spasms. 06/05/21   [provider]  traZODone (DESYREL) 50 MG tablet Take 1 tablet (50 mg total) by mouth at bedtime. Patient taking differently: Take 50 mg by mouth at bedtime as needed for sleep. 05/20/21   Parks Ranger, DO  verapamil (CALAN-SR) 240 MG CR tablet Take 240 mg by mouth at bedtime. 06/07/21   [provider]  zolpidem (AMBIEN) 10 MG tablet Take 1  tablet (10 mg total) by mouth at bedtime. 05/20/21   Parks Ranger, DO      Allergies    Nsaids, Diphenhydramine hcl, Flexeril [cyclobenzaprine], Fluoxetine, Hctz [hydrochlorothiazide], Rexulti [brexpiprazole], Seroquel [quetiapine], Gabapentin, and Hydroxyzine    Review of Systems   Review of Systems  Constitutional:  Positive for fatigue. Negative for chills, diaphoresis and fever.  HENT:  Negative for congestion.   Eyes:  Negative for visual disturbance.  Respiratory:  Negative for cough, chest  tightness, shortness of breath and wheezing.   Cardiovascular:  Negative for chest pain and palpitations.  Gastrointestinal:  Positive for constipation and nausea. Negative for abdominal pain, diarrhea and vomiting.  Genitourinary:  Negative for dysuria and flank pain.  Musculoskeletal:  Negative for back pain, neck pain and neck stiffness.  Skin:  Negative for rash and wound.  Neurological:  Positive for light-headedness. Negative for dizziness, syncope, weakness and headaches.  Psychiatric/Behavioral:  Positive for agitation. Negative for confusion.   All other systems reviewed and are negative.  Physical Exam Updated Vital Signs BP 121/75    Pulse 88    Temp 98.3 F (36.8 C) (Oral)    Resp (!) 21    Ht 5' 10"  (1.778 m)    Wt 63.5 kg    SpO2 99%    BMI 20.09 kg/m  Physical Exam Vitals and nursing note reviewed.  Constitutional:      General: He is not in acute distress.    Appearance: He is well-developed. He is not ill-appearing, toxic-appearing or diaphoretic.  HENT:     Head: Normocephalic and atraumatic.     Nose: Nose normal. No congestion.     Mouth/Throat:     Mouth: Mucous membranes are moist.     Pharynx: No oropharyngeal exudate or posterior oropharyngeal erythema.  Eyes:     Extraocular Movements: Extraocular movements intact.     Conjunctiva/sclera: Conjunctivae normal.     Pupils: Pupils are equal, round, and reactive to light.  Cardiovascular:     Rate and Rhythm: Normal rate and regular rhythm.     Heart sounds: No murmur heard. Pulmonary:     Effort: Pulmonary effort is normal. No respiratory distress.     Breath sounds: Normal breath sounds. No stridor. No wheezing, rhonchi or rales.  Chest:     Chest wall: No tenderness.  Abdominal:     General: Abdomen is flat.     Palpations: Abdomen is soft.     Tenderness: There is no abdominal tenderness. There is no right CVA tenderness, left CVA tenderness, guarding or rebound.  Musculoskeletal:        General: No  swelling or tenderness.     Cervical back: Neck supple. No tenderness.  Skin:    General: Skin is warm and dry.     Capillary Refill: Capillary refill takes less than 2 seconds.     Findings: No erythema.  Neurological:     General: No focal deficit present.     Mental Status: He is alert.     Sensory: No sensory deficit.     Motor: No weakness.  Psychiatric:        Attention and Perception: Perception normal.        Mood and Affect: Mood is anxious and depressed.        Behavior: Behavior is agitated.        Thought Content: Thought content includes suicidal ideation.    ED Results / Procedures / Treatments   Labs (all  labs ordered are listed, but only abnormal results are displayed) Labs Reviewed  COMPREHENSIVE METABOLIC PANEL - Abnormal; Notable for the following components:      Result Value   Sodium 127 (*)    Chloride 91 (*)    Glucose, Bld 101 (*)    BUN 6 (*)    All other components within normal limits  SALICYLATE LEVEL - Abnormal; Notable for the following components:   Salicylate Lvl <4.1 (*)    All other components within normal limits  ACETAMINOPHEN LEVEL - Abnormal; Notable for the following components:   Acetaminophen (Tylenol), Serum <10 (*)    All other components within normal limits  RAPID URINE DRUG SCREEN, HOSP PERFORMED - Abnormal; Notable for the following components:   Benzodiazepines POSITIVE (*)    All other components within normal limits  ACETAMINOPHEN LEVEL - Abnormal; Notable for the following components:   Acetaminophen (Tylenol), Serum <10 (*)    All other components within normal limits  ETHANOL  CBC    EKG EKG Interpretation  Date/Time:  Friday July 01 2021 07:28:33 EST Ventricular Rate:  80 PR Interval:  180 QRS Duration: 114 QT Interval:  390 QTC Calculation: 450 R Axis:   72 Text Interpretation: Sinus rhythm Borderline intraventricular conduction delay When compared to prior, similar appearance. NO STEMI Confirmed by  Antony Blackbird [phone]) on 07/01/2021 8:34:36 AM  Radiology No results found.  Procedures Procedures    CRITICAL CARE Performed by: Gwenyth Allegra Patric Vanpelt Total critical care time: 35 minutes Critical care time was exclusive of separately billable procedures and treating other patients. Critical care was necessary to treat or prevent imminent or life-threatening deterioration. Critical care was time spent personally by me on the following activities: development of treatment plan with patient and/or surrogate as well as nursing, discussions with consultants, evaluation of patient's response to treatment, examination of patient, obtaining history from patient or surrogate, ordering and performing treatments and interventions, ordering and review of laboratory studies, ordering and review of radiographic studies, pulse oximetry and re-evaluation of patient's condition.   Medications Ordered in ED Medications  LORazepam (ATIVAN) injection 2 mg (has no administration in time range)  sodium chloride 0.9 % bolus 1,000 mL (1,000 mLs Intravenous Bolus 07/01/21 0758)  LORazepam (ATIVAN) injection 2 mg (2 mg Intravenous Given 07/01/21 0801)    ED Course/ Medical Decision Making/ A&P                           Medical Decision Making Problems Addressed: Intentional overdose, initial encounter [facility]): acute illness or injury that poses a threat to life or bodily functions    Details: Overdosed taking 60 tablets of 60 mg duloxetine tablets per EMS. Suicide attempt [facility]): acute illness or injury that poses a threat to life or bodily functions  Amount and/or Complexity of Data Reviewed Labs: ordered. ECG/medicine tests: ordered and independent interpretation performed.  Risk Prescription drug management. Parenteral controlled substances. Decision regarding hospitalization. Diagnosis or treatment significantly limited by social determinants of health. Risk Details: Patient told triage that he is  going to be homeless and that will affect his care.  Ativan given IV for agitation and anxiety.     Anthony Lamb is a 66 y.o. male with a past medical history significant for hypertension, GERD, peptic ulcer disease, ankylosing spondylitis, irritable bowel syndrome, chronic pain, anxiety, and depression who presents for suicide attempt by overdose.  According to EMS and patient, at 545 this  morning, patient tried to overdose by taking 60 duloxetine tablets.  EMS thought that they were 60 mg tablets by report.  Patient reports she is feeling some waxing waning nausea, lightheadedness, and feels like he has no bowel movement.  He feels very dehydrated and tired.  He also is feeling very anxious and agitated.  He still says he wants to kill himself.  He reports that he was discharged from the emergency department yesterday at [facility] and he still says he is going to try to kill himself he leaves again today.  On my exam, lungs clear and chest nontender.  Abdomen was nontender.  He is moving all extremities.  He has dry mucous membranes and dry appearing skin.  He is very anxious.  Patient reportedly tried to stand up to go a bowel movement but then got very lightheaded, near syncopal, and had to lay back down.  Blood pressure was in the 02R systolic.  I spoke to poison control and they feel that the patient took a potentially lethal amount of medication.  They do say that we will need to be watched for CNS depression, agitation, and seizures.  There is also some possible QT prolongation effects.  They say that his symptoms could begin anywhere from 1 to 12 hours from ingestion and he is currently approximately 2 hours into his overdose.  They say that if he develops worsening symptoms throughout his observation time, that we will delay his observation time.  If he starts having more symptoms, patient may end up needing admission for more prolonged monitoring and rehydration.  Patient will be  placed under IVC for this significant suicide attempt.  The patient remains asymptomatic, was usual reports by around 4 PM he could be stable for medical clearance however I am concerned about his agitation mixed with fatigue and his hypotension with near syncope.  Will get EKG to monitor and will get other screening labs.  They requested a Tylenol level at 9:45 AM which we will order.  He had one drawn on arrival but they feel we need to redraw it at 9:45 AM to make sure the 4-hour time is appropriate.  Anticipate reassessment and will have low threshold to admit for overdose.  10:16 AM Patient reports he is intermittently feel like he needs to pass out from getting lightheaded but also intermittently shaking and agitated.  He is very concerned how he is feeling.  Given that he is now demonstrating some worsening symptoms, we will go ahead and pursue admission for further close monitoring, rehydration, and symptomatic management.        Final Clinical Impression(s) / ED Diagnoses Final diagnoses:  Suicide attempt [facility])  Intentional overdose, initial encounter ([place])    Clinical Impression: 1. Suicide attempt ([place])   2. Intentional overdose, initial encounter [facility])     Disposition: Admit  This note was prepared with assistance of Dragon voice recognition software. Occasional wrong-word or sound-a-like substitutions may have occurred due to the inherent limitations of voice recognition software.     Marlet Korte, Gwenyth Allegra, MD 07/01/21 1058

## 2021-07-01 NOTE — ED Notes (Signed)
Pt is on a bed alarm

## 2021-07-01 NOTE — Consult Note (Addendum)
[facility] Consult   Reason for Consult:  Suicide attempt by overdose on cymbalta Referring Physician:  Dr. Olevia Bowens Patient Identification: Anthony Lamb MRN:  [id number] Principal Diagnosis: Antidepressant overdose Diagnosis:  Principal Problem:   Antidepressant overdose Active Problems:   Essential hypertension   Hyponatremia   Gastroesophageal reflux disease   Suicidal ideations   Total Time spent with patient: 45 minutes  Subjective:   Anthony Lamb is a 66 y.o. male patient admitted with suicide attempt by overdose.  Patient presented after overdosing on(60) 60 mg tablets of Cymbalta.  Patient is seen and assessed, Case discussed with Dr. Dwyane Dee.  Patient is alert and oriented x4, however presents with some confusion.  His speech is tangential, thought process is also tangential, scattered, and ruminating about his admission to the medical floor.  Patient does endorse suicide attempt, after being discharged from [facility] last night.  Patient does placed emphasis on" at her job is going to do at this time.  As stated a lot of times before but admitted this time."  Patient with history of multiple suicide gestures, suicidal threats, and suicidal ideations with plans however has never attempted.  He is well known to our psychiatric service, as he presents to the emergency room several times a week requesting assistance with his medication particularly his benzodiazepines.   On evaluation today patient does endorse feeling depressed, and continues to endorse suicidal ideations if discharged.  Patient appears to be interested in resuming his medication.  He expresses much discomfort secondary to side effects from SNRI overdose, patient is advised the symptoms are normal and he is receiving appropriate medication for the treatment of such.  Patient endorses depressive symptoms that include hopelessness, worthlessness, guilty, suicidal attempt, sadness, and weight loss.   It remains unclear if patient is delusional or grandiose, as he reports losing his apartment and not being able to pay his rent.  However he also states" he cannot be admitted to the hospital because he has to get special assistance from social services to help pay with his apartment.  He is able to recall that previously these visits were over the phone, however he states they are now in person due to [place] restrictions being lifted.  Patient also endorses that he believes he has some type of esophageal cancer, multiple studies have been ordered which have determined to be no foreign object and or foreign body.  Throughout the evaluation as noted above patient remain hyper focused on wanting his specific medications to include Ativan through his IV and Ambien for sleep.  This nurse practitioner discussed with patient his severe benzodiazepine use disorder, has now resulted in his safety being compromised by suicide attempt.  Patient is also had approximately 36 emergency room visits in the past 6 months for suicidal ideation, medication refills, anxiety, and esophageal pain.  Patient was receiving monthly prescriptions from Dr. Nancy Fetter at [facility], last field day was April 10, 2021.  After this visit he was subsequently admitted to [facility] where he received another 30-day prescription for lorazepam 1 mg p.o. 3 times daily by Dr. Louis Meckel and 30-day prescription for zolpidem 10 mg.  Patient continues to remain a danger to herself, unable to contract for safety will recommend inpatient psychiatric admission once medically stable.  HPI:  Anthony Lamb is a 66 y.o. male with a past medical history significant for hypertension, GERD, peptic ulcer disease, ankylosing spondylitis, irritable bowel syndrome, chronic pain, anxiety, and  depression who presents for suicide attempt by overdose.  According to EMS and patient, at 545 this morning, patient tried to overdose by taking 60  duloxetine tablets.  EMS thought that they were 60 mg tablets by report.  Patient reports she is feeling some waxing waning nausea, lightheadedness, and feels like he has no bowel movement.  He feels very dehydrated and tired.  He also is feeling very anxious and agitated.  He still says he wants to kill himself.  He reports that he was discharged from the emergency department yesterday at [facility] and he still says he is going to try to kill himself he leaves again today.  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 Nancy Fetter at [facility].  Past Medical History:  Past Medical History:  Diagnosis Date   Alcohol abuse, in remission    Anal fissure    Anemia    Ankylosing spondylitis (HCC)    Anxiety    Arthritis    Chronic diarrhea    Chronic headaches    Chronic pain    Colitis    Colon polyps    Depression    Esophagitis    Gastric polyps    hyperplastic and fundic gland   Gastritis    GERD (gastroesophageal reflux disease)    Hypertension    IBS (irritable bowel syndrome)    Poor dentition    SIADH (syndrome of inappropriate ADH production) ([place])     Past Surgical History:  Procedure Laterality Date   BIOPSY  11/26/2018   Procedure: BIOPSY;  Surgeon: Gatha Mayer, MD;  Location: [facility];  Service: Endoscopy;;   COLONOSCOPY W/ BIOPSIES     COLONOSCOPY WITH PROPOFOL N/A 11/26/2018   Procedure: COLONOSCOPY WITH PROPOFOL;  Surgeon: Gatha Mayer, MD;  Location: [facility];  Service: Endoscopy;  Laterality: N/A;   ESOPHAGOGASTRODUODENOSCOPY     ESOPHAGOGASTRODUODENOSCOPY (EGD) WITH PROPOFOL N/A 11/26/2018   Procedure: ESOPHAGOGASTRODUODENOSCOPY (EGD) WITH PROPOFOL;   Surgeon: Gatha Mayer, MD;  Location: [facility];  Service: Endoscopy;  Laterality: N/A;   HEMOSTASIS CLIP PLACEMENT  11/26/2018   Procedure: HEMOSTASIS CLIP PLACEMENT;  Surgeon: Gatha Mayer, MD;  Location: [facility];  Service: Endoscopy;;   HERNIA REPAIR Bilateral    HOT HEMOSTASIS N/A 11/26/2018   Procedure: HOT HEMOSTASIS (ARGON PLASMA COAGULATION/BICAP);  Surgeon: Gatha Mayer, MD;  Location: [facility];  Service: Endoscopy;  Laterality: N/A;   OPEN REDUCTION INTERNAL FIXATION (ORIF) DISTAL RADIAL FRACTURE Right 02/11/2018   Procedure: OPEN REDUCTION INTERNAL FIXATION (ORIF) DISTAL RADIAL FRACTURE;  Surgeon: Milly Jakob, MD;  Location: [facility];  Service: Orthopedics;  Laterality: Right;   POLYPECTOMY  11/26/2018   Procedure: POLYPECTOMY;  Surgeon: Gatha Mayer, MD;  Location: [facility];  Service: Endoscopy;;   TONSILLECTOMY     Family History:  Family History  Problem Relation Age of Onset   Anxiety disorder Mother    Congestive Heart Failure Mother    Crohn's disease Mother    Colon cancer Mother 67   Arthritis Father    High blood pressure Father    Crohn's disease Father    Family Psychiatric  History: Denies Social History:  Social History   Substance and Sexual Activity  Alcohol Use Not Currently  Social History   Substance and Sexual Activity  Drug Use Not Currently   Types: Marijuana    Social History   Socioeconomic History   Marital status: Single    Spouse name: Not on file   Number of children: 0   Years of education: Not on file   Highest education level: Not on file  Occupational History   Not on file  Tobacco Use   Smoking status: Former    Types: Cigarettes    Quit date: 2014    Years since quitting: 9.0   Smokeless tobacco: Never  Vaping Use   Vaping Use: Never used  Substance and Sexual Activity   Alcohol use: Not Currently   Drug use: Not Currently    Types: Marijuana   Sexual activity: Not Currently  Other Topics  Concern   Not on file  Social History Narrative   HSG. Long - term disability - unable to work. Lived with his mother in her house - she died April 22, 2023 -    Lives in apartment   Has case worker   Social Determinants of Radio broadcast assistant Strain: Not on file  Food Insecurity: Not on file  Transportation Needs: Not on file  Physical Activity: Not on file  Stress: Not on file  Social Connections: Not on file   Additional Social History:    Allergies:   Allergies  Allergen Reactions   Nsaids Other (See Comments)    GI upset- history of peptic ulcers!!   Diphenhydramine Hcl Other (See Comments)    Restlessness   Flexeril [Cyclobenzaprine] Other (See Comments)    Restlessness   Fluoxetine Other (See Comments)    Made the patient feel "worse than before" (treatment)   Hctz [Hydrochlorothiazide] Other (See Comments)    Caused to lose sodium when taking with Lisinopril   Rexulti [Brexpiprazole] Other (See Comments)    "Made me not feel right- restless"   Seroquel [Quetiapine] Other (See Comments)    Restless legs and makes the patient sweat- also does not help patient's insomnia. Pt takes this at home in 2023.   Gabapentin Diarrhea   Hydroxyzine Other (See Comments)    Restlessness    Labs:  Results for orders placed or performed during the hospital encounter of 07/01/21 (from the past 48 hour(s))  Comprehensive metabolic panel     Status: Abnormal   Collection Time: 07/01/21  7:11 AM  Result Value Ref Range   Sodium 127 (L) 135 - 145 mmol/L   Potassium 3.6 3.5 - 5.1 mmol/L   Chloride 91 (L) 98 - 111 mmol/L   CO2 27 22 - 32 mmol/L   Glucose, Bld 101 (H) 70 - 99 mg/dL    Comment: Glucose reference range applies only to samples taken after fasting for at least 8 hours.   BUN 6 (L) 8 - 23 mg/dL   Creatinine, Ser 0.69 0.61 - 1.24 mg/dL   Calcium 9.3 8.9 - 10.3 mg/dL   Total Protein 7.6 6.5 - 8.1 g/dL   Albumin 4.6 3.5 - 5.0 g/dL   AST 19 15 - 41 U/L   ALT 14 0 - 44  U/L   Alkaline Phosphatase 76 38 - 126 U/L   Total Bilirubin 0.8 0.3 - 1.2 mg/dL   GFR, Estimated >60 >60 mL/min    Comment: (NOTE) Calculated using the CKD-EPI Creatinine Equation (2021)    Anion gap 9 5 - 15    Comment: Performed at [facility],  [place] [address], [place], [place] [address]  cbc     Status: None   Collection Time: 07/01/21  7:11 AM  Result Value Ref Range   WBC 8.1 4.0 - 10.5 K/uL   RBC 4.64 4.22 - 5.81 MIL/uL   Hemoglobin 14.6 13.0 - 17.0 g/dL   HCT 43.2 39.0 - 52.0 %   MCV 93.1 80.0 - 100.0 fL   MCH 31.5 26.0 - 34.0 pg   MCHC 33.8 30.0 - 36.0 g/dL   RDW 12.9 11.5 - 15.5 %   Platelets 290 150 - 400 K/uL   nRBC 0.0 0.0 - 0.2 %    Comment: Performed at [facility], [place] [address], [place], [place] [address]  Magnesium     Status: None   Collection Time: 07/01/21  7:11 AM  Result Value Ref Range   Magnesium 2.2 1.7 - 2.4 mg/dL    Comment: Performed at [facility], [place] [address] [place], [place] [address]  Ethanol     Status: None   Collection Time: 07/01/21  7:12 AM  Result Value Ref Range   Alcohol, Ethyl (B) <10 <10 mg/dL    Comment: (NOTE) Lowest detectable limit for serum alcohol is 10 mg/dL.  For medical purposes only. Performed at [facility], [place] [address], [place], [place] [address]   Salicylate level     Status: Abnormal   Collection Time: 07/01/21  7:12 AM  Result Value Ref Range   Salicylate Lvl <0.6 (L) 7.0 - 30.0 mg/dL    Comment: Performed at [facility], [place] [address], [place], [place] [address]  Acetaminophen level     Status: Abnormal   Collection Time: 07/01/21  7:12 AM  Result Value Ref Range   Acetaminophen (Tylenol), Serum <10 (L) 10 - 30 ug/mL    Comment: (NOTE) Therapeutic concentrations vary significantly. A range of 10-30 ug/mL  may be an effective concentration for many patients. However, some  are best treated at  concentrations outside of this range. Acetaminophen concentrations >150 ug/mL at 4 hours after ingestion  and >50 ug/mL at 12 hours after ingestion are often associated with  toxic reactions.  Performed at [facility], [place] [address] [place], [place] [address]   Rapid urine drug screen (hospital performed)     Status: Abnormal   Collection Time: 07/01/21  9:49 AM  Result Value Ref Range   Opiates NONE DETECTED NONE DETECTED   Cocaine NONE DETECTED NONE DETECTED   Benzodiazepines POSITIVE (A) NONE DETECTED   Amphetamines NONE DETECTED NONE DETECTED   Tetrahydrocannabinol NONE DETECTED NONE DETECTED   Barbiturates NONE DETECTED NONE DETECTED    Comment: (NOTE) DRUG SCREEN FOR MEDICAL PURPOSES ONLY.  IF CONFIRMATION IS NEEDED FOR ANY PURPOSE, NOTIFY LAB WITHIN 5 DAYS.  LOWEST DETECTABLE LIMITS FOR URINE DRUG SCREEN Drug Class                     Cutoff (ng/mL) Amphetamine and metabolites    1000 Barbiturate and metabolites    200 Benzodiazepine                 355 Tricyclics and metabolites     300 Opiates and metabolites        300 Cocaine and metabolites        300 THC  50 Performed at [facility], [place] [address] [place], [place] [address]   Acetaminophen level     Status: Abnormal   Collection Time: 07/01/21  9:49 AM  Result Value Ref Range   Acetaminophen (Tylenol), Serum <10 (L) 10 - 30 ug/mL    Comment: (NOTE) Therapeutic concentrations vary significantly. A range of 10-30 ug/mL  may be an effective concentration for many patients. However, some  are best treated at concentrations outside of this range. Acetaminophen concentrations >150 ug/mL at 4 hours after ingestion  and >50 ug/mL at 12 hours after ingestion are often associated with  toxic reactions.  Performed at [facility], [place] [address] [place], [place] [address]     Current Facility-Administered Medications   Medication Dose Route Frequency Provider Last Rate Last Admin   0.9 % NaCl with KCl 20 mEq/ L  infusion   Intravenous Continuous Reubin Milan, MD       acetaminophen (TYLENOL) tablet 650 mg  650 mg Oral Q6H PRN Reubin Milan, MD       Or   acetaminophen (TYLENOL) suppository 650 mg  650 mg Rectal Q6H PRN Reubin Milan, MD       enoxaparin (LOVENOX) injection 40 mg  40 mg Subcutaneous Q24H Reubin Milan, MD       fentaNYL (SUBLIMAZE) injection 25 mcg  25 mcg Intravenous Once Reubin Milan, MD       LORazepam (ATIVAN) tablet 1 mg  1 mg Oral Q8H Reubin Milan, MD       midodrine (PROAMATINE) tablet 10 mg  10 mg Oral TID WC Reubin Milan, MD       pantoprazole (PROTONIX) EC tablet 40 mg  40 mg Oral QAC breakfast Reubin Milan, MD       prochlorperazine (COMPAZINE) injection 5 mg  5 mg Intravenous Q6H PRN Reubin Milan, MD       QUEtiapine (SEROQUEL) tablet 300 mg  300 mg Oral QHS Reubin Milan, MD       sucralfate (CARAFATE) tablet 1 g  1 g Oral QID Reubin Milan, MD       zolpidem [facility]) tablet 10 mg  10 mg Oral QHS Reubin Milan, MD       Current Outpatient Medications  Medication Sig Dispense Refill   acetaminophen (TYLENOL) 500 MG tablet Take 500 mg by mouth every 6 (six) hours as needed for mild pain.     diazepam (VALIUM) 5 MG tablet Take 1 tablet (5 mg total) by mouth 2 (two) times daily. (Patient not taking: Reported on 06/30/2021) 10 tablet 0   FLUoxetine (PROZAC) 20 MG capsule Take 1 capsule (20 mg total) by mouth daily. (Patient not taking: Reported on 06/30/2021) 30 capsule 3   fluticasone (FLONASE) 50 MCG/ACT nasal spray Place 1 spray into both nostrils daily for 7 days. (Patient taking differently: Place 1-2 sprays into both nostrils daily as needed for allergies or rhinitis.) 15.8 mL 0   lisinopril (ZESTRIL) 10 MG tablet Take 10 mg by mouth at bedtime.     LORazepam (ATIVAN) 1 MG tablet Take 1 mg by mouth  every 8 (eight) hours.     Multiple Vitamin (MULTIVITAMIN WITH MINERALS) TABS tablet Take 1 tablet by mouth daily. (May buy over the counter) (Patient taking differently: Take 1 tablet by mouth daily.) 1 tablet 0   ondansetron (ZOFRAN-ODT) 4 MG disintegrating tablet Take 4 mg by mouth every 8 (eight) hours  as needed for nausea or vomiting (DISSOLVE ORALLY).     pantoprazole (PROTONIX) 40 MG tablet Take 1 tablet (40 mg total) by mouth daily. (Patient taking differently: Take 40 mg by mouth daily before breakfast.) 30 tablet 11   propranolol (INDERAL) 10 MG tablet Take 1 tablet (10 mg total) by mouth 2 (two) times daily. 60 tablet 3   QUEtiapine (SEROQUEL) 300 MG tablet Take 1 tablet (300 mg total) by mouth at bedtime. 30 tablet 3   risperiDONE (RISPERDAL) 0.5 MG tablet Take 1 tablet (0.5 mg total) by mouth 2 (two) times daily. (Patient not taking: Reported on 06/30/2021) 60 tablet 3   sucralfate (CARAFATE) 1 g tablet Take 1 g by mouth 4 (four) times daily.     tiZANidine (ZANAFLEX) 4 MG tablet Take 2 mg by mouth every 8 (eight) hours as needed for muscle spasms.     traZODone (DESYREL) 50 MG tablet Take 1 tablet (50 mg total) by mouth at bedtime. (Patient taking differently: Take 50 mg by mouth at bedtime as needed for sleep.) 30 tablet 3   verapamil (CALAN-SR) 240 MG CR tablet Take 240 mg by mouth at bedtime.     zolpidem (AMBIEN) 10 MG tablet Take 1 tablet (10 mg total) by mouth at bedtime. 30 tablet 0    Musculoskeletal: Strength & Muscle Tone: within normal limits Gait & Station: normal Patient leans: N/A     Psychiatric Specialty Exam:  Presentation  General Appearance: Appropriate for Environment; Casual  Eye Contact:Fair  Speech:Clear and Coherent; Normal Rate  Speech Volume:Normal  Handedness:Right   Mood and Affect  Mood:Anxious  Affect:Appropriate; Congruent   Thought Process  Thought Processes:Irrevelant  Descriptions of  Associations:Tangential  Orientation:Full (Time, Place and Person)  Thought Content:Scattered; Rumination; Perseveration  History of Schizophrenia/Schizoaffective disorder:No  Duration of Psychotic Symptoms:Less than six months  Hallucinations:Hallucinations: Other (comment)  Ideas of Reference:None  Suicidal Thoughts:Suicidal Thoughts: Yes, Active SI Active Intent and/or Plan: With Intent; With Plan; With Means to [place]; With Access to Means  Homicidal Thoughts:Homicidal Thoughts: No   Sensorium  Memory:Immediate Fair; Recent Fair; Remote Fair  Judgment:Intact  Insight:Lacking; Present   Executive Functions  Concentration:Fair  Attention Span:Poor  [place]   Psychomotor Activity  Psychomotor Activity:Psychomotor Activity: Normal   Assets  Assets:Communication Skills; Desire for Improvement; Physical Health; Financial Resources/Insurance   Sleep  Sleep:Sleep: Fair   Physical Exam: Physical Exam Vitals and nursing note reviewed.  Constitutional:      Appearance: Normal appearance. He is normal weight.  HENT:     Head: Normocephalic.  Eyes:     General: Lids are normal.  Neurological:     Mental Status: He is alert.  Psychiatric:        Attention and Perception: He is inattentive.        Mood and Affect: Mood is anxious.        Speech: Speech is tangential.        Behavior: Behavior is hyperactive. Behavior is cooperative.        Thought Content: Thought content includes suicidal ideation. Thought content includes suicidal plan.        Cognition and Memory: Cognition and memory normal.        Judgment: Judgment is impulsive and inappropriate.   ROS Blood pressure (!) 147/80, pulse 80, temperature 98.3 F (36.8 C), temperature source Oral, resp. rate 20, height 5' 10"  (1.778 m), weight 63.5 kg, SpO2 100 %. Body mass index  is 20.09 kg/m. Patient has a longstanding history of chronic benzodiazepine  use likely secondary to alcohol use disorder, as noted above his behaviors on now extreme and is unable to cope without this medication.  The decision has been made to start patient on a benzodiazepine taper.  This nurse practitioner has reached out to Dr. Nancy Fetter at [facility] to advise of the current plan, however unsuccessful at the time of this evaluation.  Treatment Plan Summary: Daily contact with patient to assess and evaluate symptoms and progress in treatment, Medication management, and Plan   Psychiatric consult complete.  Recommended for inpatient psychiatric treatment. Patient unable to contract for safety.  If no appropriate bed at [facility] unit;  fax to surrounding facilities for appropriate bed. -Patient will be placed on involuntary commitment at this time, as he is a danger to himself and unable to contract for safety. -Once medically stable will begin benzodiazepine taper to include Klonopin 1 mg p.o. twice daily.  Will attempt to communicate this with all parties involved, spoke to Marmaduke at [facility] who states she will inform Dr. Nancy Fetter.   Disposition: Recommend psychiatric Inpatient admission when medically cleared. IVC  Suella Broad, FNP 07/01/2021 3:04 PM

## 2021-07-01 NOTE — ED Notes (Signed)
Per Poison Control if pt is still at baseline by 1600 he can be medically cleared.

## 2021-07-01 NOTE — H&P (Signed)
History and Physical    TARVARIS PUGLIA SAY:[id number] DOB: December 28, 1955 DOA: 07/01/2021  PCP: Sandi Mariscal, MD   Patient coming from: Home.  I have personally briefly reviewed patient's old medical records in [place]  Chief Complaint: Duloxetine overdose.  HPI: Anthony Lamb is a 66 y.o. male with medical history significant of alcohol abuse in remission, anal fissure, normocytic anemia, ankylosing spondylitis, anxiety, depression, osteoarthritis, history of colitis and chronic diarrhea, history of chronic headaches, history of chronic pain, esophagitis, gastric polyps, gastritis, GERD, hypertension, SIADH who is coming to the emergency department after apparently taking 60 tablets of duloxetine 60 mg.  He is tremulous at this time.  He has had multiple ER visits since September for either anxiety, suicidal ideation, epigastric pain and other issues.  He stated that he was concerned because of the possibility of homelessness and wants to die because of his.  He is anxious and tremulous.  He is not a very good historian due to the anxiety and getting distracted with other things while being interviewed.  He is currently nauseous and having some abdominal pain.  He was asking for more lorazepam just minutes after being administered a second 2 mg dose by Dr. Sherry Ruffing.  He denies headache, fever, but complains of chills, but no night sweats, dyspnea, wheezing or hemoptysis.  No chest pain, palpitations, diaphoresis, PND, orthopnea, lower extremity edema but has felt lightheaded particularly positionally.  No diarrhea, melena or hematochezia.  No dysuria, flank pain, frequency or hematuria.  No polyuria, polydipsia, polyphagia or blurred vision.  ED Course: Initial vital signs were temperature 98.3 F, pulse 88, respiration 18, BP 121/75 mmHg O2 sat 100% on room air.  The patient received lorazepam 2 mg IVP x2 3 hours apart and 1000 mL of normal saline bolus.  Poison control was called and  recommended close monitoring.  Lab work: His CBC was normal.  CMP showed a sodium 127 and chloride of 91 mmol/L.  Glucose 101 and BUN 6 mg/dL.  The rest of the CMP values were normal.  Normal acetaminophen, salicylate and alcohol level.  UDS was positive for benzodiazepines.  Review of Systems: As per HPI otherwise all other systems reviewed and are negative.  Past Medical History:  Diagnosis Date   Alcohol abuse, in remission    Anal fissure    Anemia    Ankylosing spondylitis (HCC)    Anxiety    Arthritis    Chronic diarrhea    Chronic headaches    Chronic pain    Colitis    Colon polyps    Depression    Esophagitis    Gastric polyps    hyperplastic and fundic gland   Gastritis    GERD (gastroesophageal reflux disease)    Hypertension    IBS (irritable bowel syndrome)    Poor dentition    SIADH (syndrome of inappropriate ADH production) ([place])     Past Surgical History:  Procedure Laterality Date   BIOPSY  11/26/2018   Procedure: BIOPSY;  Surgeon: Gatha Mayer, MD;  Location: [facility];  Service: Endoscopy;;   COLONOSCOPY W/ BIOPSIES     COLONOSCOPY WITH PROPOFOL N/A 11/26/2018   Procedure: COLONOSCOPY WITH PROPOFOL;  Surgeon: Gatha Mayer, MD;  Location: [facility];  Service: Endoscopy;  Laterality: N/A;   ESOPHAGOGASTRODUODENOSCOPY     ESOPHAGOGASTRODUODENOSCOPY (EGD) WITH PROPOFOL N/A 11/26/2018   Procedure: ESOPHAGOGASTRODUODENOSCOPY (EGD) WITH PROPOFOL;  Surgeon: Gatha Mayer, MD;  Location: [facility];  Service: Endoscopy;  Laterality: N/A;   HEMOSTASIS CLIP PLACEMENT  11/26/2018   Procedure: HEMOSTASIS CLIP PLACEMENT;  Surgeon: Gatha Mayer, MD;  Location: [facility];  Service: Endoscopy;;   HERNIA REPAIR Bilateral    HOT HEMOSTASIS N/A 11/26/2018   Procedure: HOT HEMOSTASIS (ARGON PLASMA COAGULATION/BICAP);  Surgeon: Gatha Mayer, MD;  Location: [facility];  Service: Endoscopy;  Laterality: N/A;   OPEN REDUCTION INTERNAL FIXATION (ORIF)  DISTAL RADIAL FRACTURE Right 02/11/2018   Procedure: OPEN REDUCTION INTERNAL FIXATION (ORIF) DISTAL RADIAL FRACTURE;  Surgeon: Milly Jakob, MD;  Location: [facility];  Service: Orthopedics;  Laterality: Right;   POLYPECTOMY  11/26/2018   Procedure: POLYPECTOMY;  Surgeon: Gatha Mayer, MD;  Location: [facility];  Service: Endoscopy;;   TONSILLECTOMY      Social History  reports that he quit smoking about 9 years ago. His smoking use included cigarettes. He has never used smokeless tobacco. He reports that he does not currently use alcohol. He reports that he does not currently use drugs after having used the following drugs: Marijuana.  Allergies  Allergen Reactions   Nsaids Other (See Comments)    GI upset- history of peptic ulcers!!   Diphenhydramine Hcl Other (See Comments)    Restlessness   Flexeril [Cyclobenzaprine] Other (See Comments)    Restlessness   Fluoxetine Other (See Comments)    Made the patient feel "worse than before" (treatment)   Hctz [Hydrochlorothiazide] Other (See Comments)    Caused to lose sodium when taking with Lisinopril   Rexulti [Brexpiprazole] Other (See Comments)    "Made me not feel right- restless"   Seroquel [Quetiapine] Other (See Comments)    Restless legs and makes the patient sweat- also does not help patient's insomnia. Pt takes this at home in 2023.   Gabapentin Diarrhea   Hydroxyzine Other (See Comments)    Restlessness    Family History  Problem Relation Age of Onset   Anxiety disorder Mother    Congestive Heart Failure Mother    Crohn's disease Mother    Colon cancer Mother 71   Arthritis Father    High blood pressure Father    Crohn's disease Father    Prior to Admission medications   Medication Sig Start Date End Date Taking? Authorizing Provider  acetaminophen (TYLENOL) 500 MG tablet Take 500 mg by mouth every 6 (six) hours as needed for mild pain.    [provider]  diazepam (VALIUM) 5 MG tablet Take 1 tablet (5 mg  total) by mouth 2 (two) times daily. Patient not taking: Reported on 06/30/2021 06/10/21   Carmin Muskrat, MD  FLUoxetine (PROZAC) 20 MG capsule Take 1 capsule (20 mg total) by mouth daily. Patient not taking: Reported on 06/30/2021 05/21/21   Parks Ranger, DO  fluticasone [facility]) 50 MCG/ACT nasal spray Place 1 spray into both nostrils daily for 7 days. Patient taking differently: Place 1-2 sprays into both nostrils daily as needed for allergies or rhinitis. 06/24/21 07/01/21  Wynona Dove A, DO  lisinopril (ZESTRIL) 10 MG tablet Take 10 mg by mouth at bedtime. 06/05/21   [provider]  LORazepam (ATIVAN) 1 MG tablet Take 1 mg by mouth every 8 (eight) hours.    [provider]  Multiple Vitamin (MULTIVITAMIN WITH MINERALS) TABS tablet Take 1 tablet by mouth daily. (May buy over the counter) Patient taking differently: Take 1 tablet by mouth daily. 06/28/18   Connye Burkitt, NP  ondansetron (ZOFRAN-ODT) 4 MG disintegrating tablet Take 4 mg by  mouth every 8 (eight) hours as needed for nausea or vomiting (DISSOLVE ORALLY).    [provider]  pantoprazole (PROTONIX) 40 MG tablet Take 1 tablet (40 mg total) by mouth daily. Patient taking differently: Take 40 mg by mouth daily before breakfast. 04/24/14   Janith Lima, MD  propranolol (INDERAL) 10 MG tablet Take 1 tablet (10 mg total) by mouth 2 (two) times daily. 05/20/21   Parks Ranger, DO  QUEtiapine (SEROQUEL) 300 MG tablet Take 1 tablet (300 mg total) by mouth at bedtime. 05/20/21   Parks Ranger, DO  risperiDONE (RISPERDAL) 0.5 MG tablet Take 1 tablet (0.5 mg total) by mouth 2 (two) times daily. Patient not taking: Reported on 06/30/2021 05/20/21   Parks Ranger, DO  sucralfate (CARAFATE) 1 g tablet Take 1 g by mouth 4 (four) times daily. 06/05/21   [provider]  tiZANidine (ZANAFLEX) 4 MG tablet Take 2 mg by mouth every 8 (eight) hours as needed for muscle spasms.  06/05/21   [provider]  traZODone (DESYREL) 50 MG tablet Take 1 tablet (50 mg total) by mouth at bedtime. Patient taking differently: Take 50 mg by mouth at bedtime as needed for sleep. 05/20/21   Parks Ranger, DO  verapamil (CALAN-SR) 240 MG CR tablet Take 240 mg by mouth at bedtime. 06/07/21   [provider]  zolpidem (AMBIEN) 10 MG tablet Take 1 tablet (10 mg total) by mouth at bedtime. 05/20/21   Parks Ranger, DO    Physical Exam: Vitals:   07/01/21 0851 07/01/21 0900 07/01/21 1010 07/01/21 1040  BP: (!) 114/97 112/69 117/80 117/71  Pulse: 66 70 69 68  Resp: (!) 29 (!) 26 (!) 22 (!) 23  Temp:      TempSrc:      SpO2: 100% 100% 100% 100%  Weight:      Height:        Constitutional: Mildly anxious, but otherwise NAD, calm, comfortable Eyes: PERRL, lids and conjunctivae normal ENMT: Mucous membranes are dry.  Posterior pharynx clear of any exudate or lesions. Neck: normal, supple, no masses, no thyromegaly Respiratory: Mildly tachypneic.  Clear to auscultation bilaterally, no wheezing, no crackles.  No accessory muscle use.  Cardiovascular: Regular rate and rhythm, no murmurs / rubs / gallops. No extremity edema. 2+ pedal pulses. No carotid bruits.  Abdomen: no distention.  Mild epigastric tenderness, no masses palpated. No hepatosplenomegaly. Bowel sounds positive.  Musculoskeletal: no clubbing / cyanosis.  Moderate generalized weakness.  Good ROM, no contractures. Normal muscle tone.  Skin: There are some areas of ecchymosis on upper extremities. Neurologic: Mild to moderate basal tremors.  CN 2-12 grossly intact. Sensation intact, DTR normal. Strength 5/5 in all 4.  Psychiatric: Alert and oriented x 3.  Anxious mood.   Labs on Admission: I have personally reviewed following labs and imaging studies  CBC: Recent Labs  Lab 06/29/21 0853 07/01/21 0711  WBC 6.8 8.1  NEUTROABS 5.2  --   HGB 13.1 14.6  HCT 38.2* 43.2  MCV 91.2 93.1   PLT 267 277    Basic Metabolic Panel: Recent Labs  Lab 06/29/21 0853 06/30/21 1315 07/01/21 0711  NA 129* 127* 127*  K 3.9 3.9 3.6  CL 93* 92* 91*  CO2 24 27 27   GLUCOSE 99 106* 101*  BUN <5* <5* 6*  CREATININE 0.77 0.81 0.69  CALCIUM 9.0 8.6* 9.3    GFR: Estimated Creatinine Clearance: 82.7 mL/min (by C-G formula based on  SCr of 0.69 mg/dL).  Liver Function Tests: Recent Labs  Lab 06/29/21 0853 07/01/21 0711  AST 18 19  ALT 13 14  ALKPHOS 65 76  BILITOT 0.5 0.8  PROT 6.2* 7.6  ALBUMIN 3.8 4.6   Radiological Exams on Admission: No results found.  EKG: Independently reviewed.  Vent. rate 80 BPM PR interval 180 ms QRS duration 114 ms QT/QTcB 390/450 ms P-R-T axes 74 72 49 Sinus rhythm Borderline intraventricular conduction delay  Assessment/Plan Principal Problem:   Antidepressant overdose In the setting of   Suicidal ideations Observation/PCU. One-to-one observation. Continue IV fluids. Close cardiac monitoring. Monitor and replace lites optimize electrolytes. Consult behavioral health once cleared from the medical standpoint.  Active Problems:   Essential hypertension Has Postural hypotension. Hold antihypertensives. Monitor blood pressure. Continue IV fluids.    Hyponatremia Fluid restriction. Continue normal saline infusion.    Gastroesophageal reflux disease Pantoprazole 40 mg p.o. daily.      DVT prophylaxis: SCDs. Code Status:   Full code. Family Communication:   Disposition Plan:   Patient is from:  Home.  Anticipated DC to:  Behavioral health unit.  Anticipated DC date:  07/02/2021 or 07/03/2021.  Anticipated DC barriers: Clinical status.  Consults called:   Admission status:  Observation/PCU.  Severity of Illness: High severity in the setting of duloxetine overdose.  Reubin Milan MD [facility]  How to contact the [facility] Attending or Consulting provider [place] or covering provider during after hours [place],  for this patient?   Check the care team in [facility] and look for a) attending/consulting [facility] provider listed and b) the [facility] team listed Log into [email or web address] and use [facility] universal password to access. If you do not have the password, please contact the hospital operator. Locate the [facility] provider you are looking for under [facility] and page to a number that you can be directly reached. If you still have difficulty reaching the provider, please page the [facility] (Director on Call) for the Hospitalists listed on amion for assistance.  07/01/2021, 11:15 AM   This document was prepared using [employer] and may contain some unintended transcription errors.

## 2021-07-02 DIAGNOSIS — E876 Hypokalemia: Secondary | ICD-10-CM | POA: Diagnosis not present

## 2021-07-02 DIAGNOSIS — F1320 Sedative, hypnotic or anxiolytic dependence, uncomplicated: Secondary | ICD-10-CM | POA: Diagnosis present

## 2021-07-02 DIAGNOSIS — T43212A Poisoning by selective serotonin and norepinephrine reuptake inhibitors, intentional self-harm, initial encounter: Secondary | ICD-10-CM | POA: Diagnosis present

## 2021-07-02 DIAGNOSIS — E871 Hypo-osmolality and hyponatremia: Secondary | ICD-10-CM | POA: Diagnosis not present

## 2021-07-02 DIAGNOSIS — T43204A Poisoning by unspecified antidepressants, undetermined, initial encounter: Secondary | ICD-10-CM | POA: Diagnosis not present

## 2021-07-02 DIAGNOSIS — E222 Syndrome of inappropriate secretion of antidiuretic hormone: Secondary | ICD-10-CM | POA: Diagnosis present

## 2021-07-02 DIAGNOSIS — K589 Irritable bowel syndrome without diarrhea: Secondary | ICD-10-CM | POA: Diagnosis present

## 2021-07-02 DIAGNOSIS — R21 Rash and other nonspecific skin eruption: Secondary | ICD-10-CM | POA: Diagnosis not present

## 2021-07-02 DIAGNOSIS — E860 Dehydration: Secondary | ICD-10-CM | POA: Diagnosis present

## 2021-07-02 DIAGNOSIS — I10 Essential (primary) hypertension: Secondary | ICD-10-CM | POA: Diagnosis present

## 2021-07-02 DIAGNOSIS — F339 Major depressive disorder, recurrent, unspecified: Secondary | ICD-10-CM | POA: Diagnosis present

## 2021-07-02 DIAGNOSIS — T7840XA Allergy, unspecified, initial encounter: Secondary | ICD-10-CM | POA: Diagnosis not present

## 2021-07-02 DIAGNOSIS — T43202A Poisoning by unspecified antidepressants, intentional self-harm, initial encounter: Secondary | ICD-10-CM | POA: Diagnosis not present

## 2021-07-02 DIAGNOSIS — J181 Lobar pneumonia, unspecified organism: Secondary | ICD-10-CM | POA: Diagnosis not present

## 2021-07-02 DIAGNOSIS — F1011 Alcohol abuse, in remission: Secondary | ICD-10-CM | POA: Diagnosis present

## 2021-07-02 DIAGNOSIS — F13280 Sedative, hypnotic or anxiolytic dependence with sedative, hypnotic or anxiolytic-induced anxiety disorder: Secondary | ICD-10-CM | POA: Diagnosis not present

## 2021-07-02 DIAGNOSIS — T43204S Poisoning by unspecified antidepressants, undetermined, sequela: Secondary | ICD-10-CM | POA: Diagnosis not present

## 2021-07-02 DIAGNOSIS — F411 Generalized anxiety disorder: Secondary | ICD-10-CM | POA: Diagnosis present

## 2021-07-02 DIAGNOSIS — R251 Tremor, unspecified: Secondary | ICD-10-CM | POA: Diagnosis present

## 2021-07-02 DIAGNOSIS — M459 Ankylosing spondylitis of unspecified sites in spine: Secondary | ICD-10-CM | POA: Diagnosis present

## 2021-07-02 DIAGNOSIS — T43202D Poisoning by unspecified antidepressants, intentional self-harm, subsequent encounter: Secondary | ICD-10-CM | POA: Diagnosis not present

## 2021-07-02 DIAGNOSIS — U071 COVID-19: Secondary | ICD-10-CM | POA: Diagnosis present

## 2021-07-02 DIAGNOSIS — F1010 Alcohol abuse, uncomplicated: Secondary | ICD-10-CM | POA: Diagnosis not present

## 2021-07-02 DIAGNOSIS — L299 Pruritus, unspecified: Secondary | ICD-10-CM | POA: Diagnosis not present

## 2021-07-02 DIAGNOSIS — R042 Hemoptysis: Secondary | ICD-10-CM | POA: Diagnosis not present

## 2021-07-02 DIAGNOSIS — G4700 Insomnia, unspecified: Secondary | ICD-10-CM | POA: Diagnosis not present

## 2021-07-02 DIAGNOSIS — T50901A Poisoning by unspecified drugs, medicaments and biological substances, accidental (unintentional), initial encounter: Secondary | ICD-10-CM | POA: Insufficient documentation

## 2021-07-02 DIAGNOSIS — F332 Major depressive disorder, recurrent severe without psychotic features: Secondary | ICD-10-CM | POA: Diagnosis not present

## 2021-07-02 DIAGNOSIS — K219 Gastro-esophageal reflux disease without esophagitis: Secondary | ICD-10-CM | POA: Diagnosis present

## 2021-07-02 DIAGNOSIS — G894 Chronic pain syndrome: Secondary | ICD-10-CM | POA: Diagnosis present

## 2021-07-02 DIAGNOSIS — I951 Orthostatic hypotension: Secondary | ICD-10-CM | POA: Diagnosis present

## 2021-07-02 DIAGNOSIS — F418 Other specified anxiety disorders: Secondary | ICD-10-CM | POA: Diagnosis not present

## 2021-07-02 DIAGNOSIS — Z23 Encounter for immunization: Secondary | ICD-10-CM | POA: Diagnosis present

## 2021-07-02 DIAGNOSIS — T43202S Poisoning by unspecified antidepressants, intentional self-harm, sequela: Secondary | ICD-10-CM | POA: Diagnosis not present

## 2021-07-02 DIAGNOSIS — M1990 Unspecified osteoarthritis, unspecified site: Secondary | ICD-10-CM | POA: Diagnosis present

## 2021-07-02 DIAGNOSIS — T50902A Poisoning by unspecified drugs, medicaments and biological substances, intentional self-harm, initial encounter: Secondary | ICD-10-CM | POA: Diagnosis not present

## 2021-07-02 DIAGNOSIS — T43204D Poisoning by unspecified antidepressants, undetermined, subsequent encounter: Secondary | ICD-10-CM | POA: Diagnosis not present

## 2021-07-02 DIAGNOSIS — M542 Cervicalgia: Secondary | ICD-10-CM | POA: Diagnosis present

## 2021-07-02 DIAGNOSIS — T1491XA Suicide attempt, initial encounter: Secondary | ICD-10-CM | POA: Diagnosis present

## 2021-07-02 LAB — BASIC METABOLIC PANEL
Anion gap: 8 (ref 5–15)
Anion gap: 10 (ref 5–15)
BUN: <5 mg/dL — ABNORMAL LOW (ref 8–23)
BUN: <5 mg/dL — ABNORMAL LOW (ref 8–23)
CO2: 24 mmol/L (ref 22–32)
CO2: 24 mmol/L (ref 22–32)
Calcium: 8.4 mg/dL — ABNORMAL LOW (ref 8.9–10.3)
Calcium: 8.1 mg/dL — ABNORMAL LOW (ref 8.9–10.3)
Chloride: 89 mmol/L — ABNORMAL LOW (ref 98–111)
Chloride: 85 mmol/L — ABNORMAL LOW (ref 98–111)
Creatinine, Ser: 0.52 mg/dL — ABNORMAL LOW (ref 0.61–1.24)
Creatinine, Ser: 0.52 mg/dL — ABNORMAL LOW (ref 0.61–1.24)
GFR, Estimated: >60 mL/min (ref >60)
GFR, Estimated: >60 mL/min (ref >60)
Glucose, Bld: 80 mg/dL (ref 70–99)
Glucose, Bld: 80 mg/dL (ref 70–99)
Potassium: 3.8 mmol/L (ref 3.5–5.1)
Potassium: 3.5 mmol/L (ref 3.5–5.1)
Sodium: 121 mmol/L — ABNORMAL LOW (ref 135–145)
Sodium: 119 mmol/L — CL (ref 135–145)

## 2021-07-02 LAB — HIV ANTIBODY (ROUTINE TESTING W REFLEX): HIV Screen 4th Generation wRfx: NONREACTIVE

## 2021-07-02 MED ORDER — METHOCARBAMOL 500 MG PO TABS
750.0000 mg | ORAL_TABLET | Freq: Once | ORAL | Status: AC
  Administered 2021-07-02: 750 mg via ORAL
  Filled 2021-07-02: qty 2

## 2021-07-02 MED ORDER — ORAL CARE MOUTH RINSE
15.0000 mL | Freq: Two times a day (BID) | OROMUCOSAL | Status: DC
  Administered 2021-07-02 – 2021-07-04 (×5): 15 mL via OROMUCOSAL

## 2021-07-02 MED ORDER — MELATONIN 5 MG PO TABS
5.0000 mg | ORAL_TABLET | Freq: Once | ORAL | Status: AC
  Administered 2021-07-02: 5 mg via ORAL
  Filled 2021-07-02: qty 1

## 2021-07-02 MED ORDER — MIDODRINE HCL 5 MG PO TABS
10.0000 mg | ORAL_TABLET | Freq: Three times a day (TID) | ORAL | Status: DC
  Administered 2021-07-03: 10 mg via ORAL
  Filled 2021-07-02 (×2): qty 2

## 2021-07-02 MED ORDER — SODIUM CHLORIDE 0.9 % IV SOLN: INTRAVENOUS | Status: DC

## 2021-07-02 NOTE — Plan of Care (Signed)
°  Problem: Education: Goal: Knowledge of risk factors and measures for prevention of condition will improve Outcome: Progressing   Problem: Coping: Goal: Psychosocial and spiritual needs will be supported Outcome: Progressing   Problem: Coping: Goal: Ability to identify and develop effective coping behavior will improve Outcome: Progressing   Problem: Coping: Goal: Ability to interact with others will improve Outcome: Progressing

## 2021-07-02 NOTE — Progress Notes (Signed)
PROGRESS NOTE  Anthony Lamb XIP:[id number] DOB: 24-Mar-1956 DOA: 07/01/2021 PCP: Sandi Mariscal, MD   LOS: 0 days   Brief Narrative / Interim history: 66 year old male with history of EtOH abuse, ankylosing spondylitis, anxiety, depression, history of colitis and chronic diarrhea, chronic headaches, chronic pain, hypertension, comes into the hospital after taking 60 tablets of duloxetine.  He has had multiple ER visits since September for anxiety, suicidal ideation, pain and other issues.  He was concerned about becoming homeless and wanted to die because of this.  He was admitted to the hospital, poison control consulted as well as psychiatry.  He was also found to be COVID-positive  Subjective / 24h Interval events: No significant complaints this morning.  Denies any shortness of breath, denies any chest pain.  Assessment & Plan: Principal problem Antidepressant overdose, suicidal ideation-continue suicide precautions, sitter.  Psychiatry consulted, recommending inpatient psychiatric admission when medically cleared.  Active problems Hyponatremia-he looks dry, limited IV fluids, monitor sodium and keep on fluid restriction  History of hypotension-on chronic midodrine, hold, currently hypertensive  GERD-continue PPI  EtOH use-tremulous, continue CIWA, continue Ativan  COVID-19-incidental, no significant symptoms.  Chest x-ray unremarkable.  On room air.  Has risk factors, he is 66 years old, started on Molnupiravir, continue  Scheduled Meds:  enoxaparin (LOVENOX) injection  40 mg Subcutaneous Q24H   LORazepam  1 mg Intravenous Once   LORazepam  1 mg Oral Q8H   mouth rinse  15 mL Mouth Rinse BID   midodrine  10 mg Oral TID WC   molnupiravir EUA  4 capsule Oral BID   pantoprazole  40 mg Oral QAC breakfast   QUEtiapine  300 mg Oral QHS   sucralfate  1 g Oral QID   zolpidem  5 mg Oral QHS   Continuous Infusions: PRN Meds:.acetaminophen **OR** acetaminophen,  prochlorperazine  Diet Orders (From admission, onward)     Start     Ordered   07/01/21 1129  Diet Heart Room service appropriate? Yes; Fluid consistency: Thin; Fluid restriction: 1200 mL Fluid  Diet effective now       Question Answer Comment  Room service appropriate? Yes   Fluid consistency: Thin   Fluid restriction: 1200 mL Fluid      07/01/21 1128            DVT prophylaxis: enoxaparin (LOVENOX) injection 40 mg Start: 07/01/21 2200 SCDs Start: 07/01/21 1108   Lab Results  Component Value Date   PLT 290 07/01/2021      Code Status: Full Code  Family Communication: No family at bedside  Status is: Observation  The patient will require care spanning > 2 midnights and should be moved to inpatient because: Significant persistent withdrawals, needs inpatient psychiatric placement but currently on Covid precautions  Level of care: Progressive  Consultants:  Psychiatry  Procedures:  none  Microbiology  none  Antimicrobials: none    Objective: Vitals:   07/01/21 2147 07/01/21 2347 07/02/21 0756 07/02/21 0800  BP: (!) 164/94 (!) 154/84 (!) 151/82   Pulse: 79 89 76   Resp: 20 (!) 28 16 19   Temp: 98.2 F (36.8 C) 98.7 F (37.1 C) 97.7 F (36.5 C)   TempSrc: Oral Oral Oral   SpO2: 98%  97%   Weight:      Height:        Intake/Output Summary (Last 24 hours) at 07/02/2021 1033 Last data filed at 07/02/2021 0900 Gross per 24 hour  Intake 1321.42 ml  Output 650 ml  Net 671.42 ml   Wt Readings from Last 3 Encounters:  07/01/21 63.5 kg  06/29/21 63.5 kg  06/22/21 65.8 kg    Examination:  Constitutional: NAD Eyes: no scleral icterus ENMT: Mucous membranes are dry.  Neck: normal, supple Respiratory: clear to auscultation bilaterally, no wheezing, no crackles. Normal respiratory effort. No accessory muscle use.  Cardiovascular: Regular rate and rhythm, no murmurs / rubs / gallops. No LE edema. Abdomen: non distended, no tenderness. Bowel sounds  positive.  Musculoskeletal: no clubbing / cyanosis.  Skin: no rashes Neurologic: No focal deficits  Data Reviewed: I have independently reviewed following labs and imaging studies   CBC Recent Labs  Lab 06/29/21 0853 07/01/21 0711  WBC 6.8 8.1  HGB 13.1 14.6  HCT 38.2* 43.2  PLT 267 290  MCV 91.2 93.1  MCH 31.3 31.5  MCHC 34.3 33.8  RDW 12.8 12.9  LYMPHSABS 0.8  --   MONOABS 0.8  --   EOSABS 0.0  --   BASOSABS 0.0  --     Recent Labs  Lab 06/29/21 0853 06/30/21 1315 07/01/21 0711 07/01/21 1946 07/02/21 0343  NA 129* 127* 127* 121* 121*  K 3.9 3.9 3.6 3.6 3.8  CL 93* 92* 91* 88* 89*  CO2 24 27 27 23 24   GLUCOSE 99 106* 101* 97 80  BUN <5* <5* 6* 7* <5*  CREATININE 0.77 0.81 0.69 0.61 0.52*  CALCIUM 9.0 8.6* 9.3 8.0* 8.4*  AST 18  --  19  --   --   ALT 13  --  14  --   --   ALKPHOS 65  --  76  --   --   BILITOT 0.5  --  0.8  --   --   ALBUMIN 3.8  --  4.6  --   --   MG  --   --  2.2  --   --   TSH  --   --   --  0.498  --     ------------------------------------------------------------------------------------------------------------------ No results for input(s): CHOL, HDL, LDLCALC, TRIG, CHOLHDL, LDLDIRECT in the last 72 hours.  Lab Results  Component Value Date   HGBA1C 5.4 05/07/2021   ------------------------------------------------------------------------------------------------------------------ Recent Labs    07/01/21 1946  TSH 0.498    Cardiac Enzymes No results for input(s): CKMB, TROPONINI, MYOGLOBIN in the last 168 hours.  Invalid input(s): CK ------------------------------------------------------------------------------------------------------------------ No results found for: BNP  CBG: No results for input(s): GLUCAP in the last 168 hours.  Recent Results (from the past 240 hour(s))  Resp Panel by RT-PCR (Flu A&B, Covid) Nasopharyngeal Swab     Status: None   Collection Time: 06/24/21  7:35 AM   Specimen: Nasopharyngeal Swab;  Nasopharyngeal(NP) swabs in vial transport medium  Result Value Ref Range Status   SARS Coronavirus 2 by RT PCR NEGATIVE NEGATIVE Final    Comment: (NOTE) SARS-CoV-2 target nucleic acids are NOT DETECTED.  The SARS-CoV-2 RNA is generally detectable in upper respiratory specimens during the acute phase of infection. The lowest concentration of SARS-CoV-2 viral copies this assay can detect is 138 copies/mL. A negative result does not preclude SARS-Cov-2 infection and should not be used as the sole basis for treatment or other patient management decisions. A negative result may occur with  improper specimen collection/handling, submission of specimen other than nasopharyngeal swab, presence of viral mutation(s) within the areas targeted by this assay, and inadequate number of viral copies(<138 copies/mL). A negative result must be combined with clinical observations, patient  history, and epidemiological information. The expected result is Negative.  Fact Sheet for Patients:  EntrepreneurPulse.com.au  Fact Sheet for Healthcare Providers:  IncredibleEmployment.be  This test is no t yet approved or cleared by the [place] FDA and  has been authorized for detection and/or diagnosis of SARS-CoV-2 by FDA under an Emergency Use Authorization (EUA). This EUA will remain  in effect (meaning this test can be used) for the duration of the COVID-19 declaration under Section 564(b)(1) of the Act, 21 U.S.C.section 360bbb-3(b)(1), unless the authorization is terminated  or revoked sooner.       Influenza A by PCR NEGATIVE NEGATIVE Final   Influenza B by PCR NEGATIVE NEGATIVE Final    Comment: (NOTE) The Xpert Xpress SARS-CoV-2/FLU/RSV plus assay is intended as an aid in the diagnosis of influenza from Nasopharyngeal swab specimens and should not be used as a sole basis for treatment. Nasal washings and aspirates are unacceptable for Xpert Xpress  SARS-CoV-2/FLU/RSV testing.  Fact Sheet for Patients: EntrepreneurPulse.com.au  Fact Sheet for Healthcare Providers: IncredibleEmployment.be  This test is not yet approved or cleared by the [place] FDA and has been authorized for detection and/or diagnosis of SARS-CoV-2 by FDA under an Emergency Use Authorization (EUA). This EUA will remain in effect (meaning this test can be used) for the duration of the COVID-19 declaration under Section 564(b)(1) of the Act, 21 U.S.C. section 360bbb-3(b)(1), unless the authorization is terminated or revoked.  Performed at [facility], [address] [place], [place] [address]   Resp Panel by RT-PCR (Flu A&B, Covid) Nasopharyngeal Swab     Status: None   Collection Time: 06/29/21  8:55 AM   Specimen: Nasopharyngeal Swab; Nasopharyngeal(NP) swabs in vial transport medium  Result Value Ref Range Status   SARS Coronavirus 2 by RT PCR NEGATIVE NEGATIVE Final    Comment: (NOTE) SARS-CoV-2 target nucleic acids are NOT DETECTED.  The SARS-CoV-2 RNA is generally detectable in upper respiratory specimens during the acute phase of infection. The lowest concentration of SARS-CoV-2 viral copies this assay can detect is 138 copies/mL. A negative result does not preclude SARS-Cov-2 infection and should not be used as the sole basis for treatment or other patient management decisions. A negative result may occur with  improper specimen collection/handling, submission of specimen other than nasopharyngeal swab, presence of viral mutation(s) within the areas targeted by this assay, and inadequate number of viral copies(<138 copies/mL). A negative result must be combined with clinical observations, patient history, and epidemiological information. The expected result is Negative.  Fact Sheet for Patients:  EntrepreneurPulse.com.au  Fact Sheet for Healthcare Providers:   IncredibleEmployment.be  This test is no t yet approved or cleared by the [place] FDA and  has been authorized for detection and/or diagnosis of SARS-CoV-2 by FDA under an Emergency Use Authorization (EUA). This EUA will remain  in effect (meaning this test can be used) for the duration of the COVID-19 declaration under Section 564(b)(1) of the Act, 21 U.S.C.section 360bbb-3(b)(1), unless the authorization is terminated  or revoked sooner.       Influenza A by PCR NEGATIVE NEGATIVE Final   Influenza B by PCR NEGATIVE NEGATIVE Final    Comment: (NOTE) The Xpert Xpress SARS-CoV-2/FLU/RSV plus assay is intended as an aid in the diagnosis of influenza from Nasopharyngeal swab specimens and should not be used as a sole basis for treatment. Nasal washings and aspirates are unacceptable for Xpert Xpress SARS-CoV-2/FLU/RSV testing.  Fact Sheet for Patients: EntrepreneurPulse.com.au  Fact Sheet  for Healthcare Providers: IncredibleEmployment.be  This test is not yet approved or cleared by the [place] and has been authorized for detection and/or diagnosis of SARS-CoV-2 by FDA under an Emergency Use Authorization (EUA). This EUA will remain in effect (meaning this test can be used) for the duration of the COVID-19 declaration under Section 564(b)(1) of the Act, 21 U.S.C. section 360bbb-3(b)(1), unless the authorization is terminated or revoked.  Performed at [facility], [place] [address] [place], [place] [address]   Resp Panel by RT-PCR (Flu A&B, Covid) Nasopharyngeal Swab     Status: Abnormal   Collection Time: 07/01/21  4:41 PM   Specimen: Nasopharyngeal Swab; Nasopharyngeal(NP) swabs in vial transport medium  Result Value Ref Range Status   SARS Coronavirus 2 by RT PCR POSITIVE (A) NEGATIVE Final    Comment: (NOTE) SARS-CoV-2 target nucleic acids are DETECTED.  The SARS-CoV-2 RNA is generally detectable  in upper respiratory specimens during the acute phase of infection. Positive results are indicative of the presence of the identified virus, but do not rule out bacterial infection or co-infection with other pathogens not detected by the test. Clinical correlation with patient history and other diagnostic information is necessary to determine patient infection status. The expected result is Negative.  Fact Sheet for Patients: EntrepreneurPulse.com.au  Fact Sheet for Healthcare Providers: IncredibleEmployment.be  This test is not yet approved or cleared by the [place] FDA and  has been authorized for detection and/or diagnosis of SARS-CoV-2 by FDA under an Emergency Use Authorization (EUA).  This EUA will remain in effect (meaning this test can be used) for the duration of  the COVID-19 declaration under Section 564(b)(1) of the A ct, 21 U.S.C. section 360bbb-3(b)(1), unless the authorization is terminated or revoked sooner.     Influenza A by PCR NEGATIVE NEGATIVE Final   Influenza B by PCR NEGATIVE NEGATIVE Final    Comment: (NOTE) The Xpert Xpress SARS-CoV-2/FLU/RSV plus assay is intended as an aid in the diagnosis of influenza from Nasopharyngeal swab specimens and should not be used as a sole basis for treatment. Nasal washings and aspirates are unacceptable for Xpert Xpress SARS-CoV-2/FLU/RSV testing.  Fact Sheet for Patients: EntrepreneurPulse.com.au  Fact Sheet for Healthcare Providers: IncredibleEmployment.be  This test is not yet approved or cleared by the [place] FDA and has been authorized for detection and/or diagnosis of SARS-CoV-2 by FDA under an Emergency Use Authorization (EUA). This EUA will remain in effect (meaning this test can be used) for the duration of the COVID-19 declaration under Section 564(b)(1) of the Act, 21 U.S.C. section 360bbb-3(b)(1), unless the authorization  is terminated or revoked.  Performed at [facility], [place] [address], [place], [place] [address]      Radiology Studies: DG CHEST PORT 1 VIEW  Result Date: 07/01/2021 CLINICAL DATA:  COVID-19 positivity with shortness of breath EXAM: PORTABLE CHEST 1 VIEW COMPARISON:  06/18/2021 FINDINGS: Cardiac shadow is within limits. Mild aortic calcifications are again seen. Lungs are clear bilaterally. No bony abnormality is seen. IMPRESSION: No acute abnormality noted. Electronically Signed   By: Inez Catalina M.D.   On: 07/01/2021 19:48     Marzetta Board, MD, PhD [facility]  Between 7 am - 7 pm I am available, please contact me via Amion (for emergencies) or Securechat (non urgent messages)  Between 7 pm - 7 am I am not available, please contact night coverage MD/APP via Amion

## 2021-07-02 NOTE — Plan of Care (Signed)
°  Problem: Education: Goal: Knowledge of risk factors and measures for prevention of condition will improve Outcome: Progressing   Problem: Coping: Goal: Psychosocial and spiritual needs will be supported Outcome: Progressing   Problem: Respiratory: Goal: Will maintain a patent airway Outcome: Progressing Goal: Complications related to the disease process, condition or treatment will be avoided or minimized Outcome: Progressing   Problem: Activity: Goal: Will identify at least one activity in which they can participate Outcome: Progressing   Problem: Coping: Goal: Ability to identify and develop effective coping behavior will improve Outcome: Progressing Goal: Ability to interact with others will improve Outcome: Progressing Goal: Demonstration of participation in decision-making regarding own care will improve Outcome: Progressing Goal: Ability to use eye contact when communicating with others will improve Outcome: Progressing   Problem: Health Behavior/Discharge Planning: Goal: Identification of resources available to assist in meeting health care needs will improve Outcome: Progressing   Problem: Self-Concept: Goal: Will verbalize positive feelings about self Outcome: Progressing

## 2021-07-03 ENCOUNTER — Encounter (HOSPITAL_COMMUNITY): Payer: Self-pay | Admitting: Internal Medicine

## 2021-07-03 DIAGNOSIS — E871 Hypo-osmolality and hyponatremia: Secondary | ICD-10-CM

## 2021-07-03 DIAGNOSIS — T43202A Poisoning by unspecified antidepressants, intentional self-harm, initial encounter: Secondary | ICD-10-CM | POA: Diagnosis not present

## 2021-07-03 LAB — OSMOLALITY: Osmolality: 226 mOsm/kg — CL (ref 275–295)

## 2021-07-03 LAB — SODIUM, URINE, RANDOM: Sodium, Ur: 98 mmol/L

## 2021-07-03 LAB — BASIC METABOLIC PANEL
Anion gap: 13 (ref 5–15)
Anion gap: 10 (ref 5–15)
BUN: <5 mg/dL — ABNORMAL LOW (ref 8–23)
BUN: 5 mg/dL — ABNORMAL LOW (ref 8–23)
CO2: 20 mmol/L — ABNORMAL LOW (ref 22–32)
CO2: 23 mmol/L (ref 22–32)
Calcium: 8.6 mg/dL — ABNORMAL LOW (ref 8.9–10.3)
Calcium: 8 mg/dL — ABNORMAL LOW (ref 8.9–10.3)
Chloride: 76 mmol/L — ABNORMAL LOW (ref 98–111)
Chloride: 79 mmol/L — ABNORMAL LOW (ref 98–111)
Creatinine, Ser: 0.43 mg/dL — ABNORMAL LOW (ref 0.61–1.24)
Creatinine, Ser: 0.48 mg/dL — ABNORMAL LOW (ref 0.61–1.24)
GFR, Estimated: >60 mL/min (ref >60)
GFR, Estimated: >60 mL/min (ref >60)
Glucose, Bld: 76 mg/dL (ref 70–99)
Glucose, Bld: 78 mg/dL (ref 70–99)
Potassium: 3.6 mmol/L (ref 3.5–5.1)
Potassium: 3.3 mmol/L — ABNORMAL LOW (ref 3.5–5.1)
Sodium: 109 mmol/L — CL (ref 135–145)
Sodium: 112 mmol/L — CL (ref 135–145)

## 2021-07-03 LAB — SODIUM
Sodium: 108 mmol/L — CL (ref 135–145)
Sodium: 110 mmol/L — CL (ref 135–145)
Sodium: 111 mmol/L — CL (ref 135–145)
Sodium: 115 mmol/L — CL (ref 135–145)

## 2021-07-03 LAB — MRSA NEXT GEN BY PCR, NASAL: MRSA by PCR Next Gen: NOT DETECTED

## 2021-07-03 LAB — COMPREHENSIVE METABOLIC PANEL
ALT: 14 U/L (ref 0–44)
AST: 21 U/L (ref 15–41)
Albumin: 3.8 g/dL (ref 3.5–5.0)
Alkaline Phosphatase: 64 U/L (ref 38–126)
Anion gap: 11 (ref 5–15)
BUN: <5 mg/dL — ABNORMAL LOW (ref 8–23)
CO2: 25 mmol/L (ref 22–32)
Calcium: 8.5 mg/dL — ABNORMAL LOW (ref 8.9–10.3)
Chloride: 76 mmol/L — ABNORMAL LOW (ref 98–111)
Creatinine, Ser: 0.4 mg/dL — ABNORMAL LOW (ref 0.61–1.24)
GFR, Estimated: >60 mL/min (ref >60)
Glucose, Bld: 74 mg/dL (ref 70–99)
Potassium: 3.6 mmol/L (ref 3.5–5.1)
Sodium: 112 mmol/L — CL (ref 135–145)
Total Bilirubin: 1.2 mg/dL (ref 0.3–1.2)
Total Protein: 6.5 g/dL (ref 6.5–8.1)

## 2021-07-03 LAB — OSMOLALITY, URINE: Osmolality, Ur: 652 mOsm/kg (ref 300–900)

## 2021-07-03 MED ORDER — POTASSIUM CHLORIDE CRYS ER 20 MEQ PO TBCR
40.0000 meq | EXTENDED_RELEASE_TABLET | Freq: Once | ORAL | Status: AC
  Administered 2021-07-03: 40 meq via ORAL
  Filled 2021-07-03: qty 2

## 2021-07-03 MED ORDER — CHLORHEXIDINE GLUCONATE CLOTH 2 % EX PADS
6.0000 | MEDICATED_PAD | Freq: Every day | CUTANEOUS | Status: DC
  Administered 2021-07-03 – 2021-07-30 (×26): 6 via TOPICAL

## 2021-07-03 MED ORDER — OXYCODONE HCL 5 MG PO TABS
5.0000 mg | ORAL_TABLET | Freq: Four times a day (QID) | ORAL | Status: DC | PRN
Start: 2021-07-03 — End: 2021-07-04
  Administered 2021-07-03 – 2021-07-04 (×4): 5 mg via ORAL
  Filled 2021-07-03 (×4): qty 1

## 2021-07-03 MED ORDER — SODIUM CHLORIDE 3 % IV SOLN
INTRAVENOUS | Status: DC
  Filled 2021-07-03 (×3): qty 500

## 2021-07-03 NOTE — Progress Notes (Signed)
PROGRESS NOTE  Anthony Lamb KGM:[id number] DOB: 02/13/1956 DOA: 07/01/2021 PCP: Sandi Mariscal, MD   LOS: 1 day   Brief Narrative / Interim history: 66 year old male with history of EtOH abuse, ankylosing spondylitis, anxiety, depression, history of colitis and chronic diarrhea, chronic headaches, chronic pain, hypertension, comes into the hospital after taking 60 tablets of duloxetine.  He has had multiple ER visits since September for anxiety, suicidal ideation, pain and other issues.  He was concerned about becoming homeless and wanted to die because of this.  He was admitted to the hospital, poison control consulted as well as psychiatry.  He was also found to be COVID-positive  Subjective / 24h Interval events: Complains of a headache, visibly tremulous this morning  Assessment & Plan: Principal problem Antidepressant overdose, suicidal ideation-continue suicide precautions, sitter.  Psychiatry consulted, recommending inpatient psychiatric admission when medically cleared.  Active problems Severe hyponatremia-has a history of SIADH, getting significantly worse today.  Nephrology consulted, recommending hypertonic saline, to stepdown.  Continue to closely monitor sodium levels  History of hypotension-on chronic midodrine, hold, currently hypotensive  GERD-continue PPI  EtOH use-he tells me he has not had anything to drink in the past month however he is quite tremulous, continue CIWA, continue Ativan  COVID-19-incidental, no significant symptoms.  Chest x-ray unremarkable.  On room air.  Has risk factors, he is 66 years old, started on Molnupiravir, continue  Scheduled Meds:  Chlorhexidine Gluconate Cloth  6 each Topical Daily   enoxaparin (LOVENOX) injection  40 mg Subcutaneous Q24H   LORazepam  1 mg Oral Q8H   mouth rinse  15 mL Mouth Rinse BID   midodrine  10 mg Oral TID WC   molnupiravir EUA  4 capsule Oral BID   pantoprazole  40 mg Oral QAC breakfast   QUEtiapine  300 mg  Oral QHS   sucralfate  1 g Oral QID   zolpidem  5 mg Oral QHS   Continuous Infusions:  sodium chloride (hypertonic)     PRN Meds:.acetaminophen **OR** acetaminophen, oxyCODONE, prochlorperazine  Diet Orders (From admission, onward)     Start     Ordered   07/01/21 1129  Diet Heart Room service appropriate? Yes; Fluid consistency: Thin; Fluid restriction: 1200 mL Fluid  Diet effective now       Question Answer Comment  Room service appropriate? Yes   Fluid consistency: Thin   Fluid restriction: 1200 mL Fluid      07/01/21 1128            DVT prophylaxis: enoxaparin (LOVENOX) injection 40 mg Start: 07/01/21 2200 SCDs Start: 07/01/21 1108   Lab Results  Component Value Date   PLT 290 07/01/2021      Code Status: Full Code  Family Communication: No family at bedside  Status is: Inpatient   Level of care: Stepdown  Consultants:  Psychiatry  Procedures:  none  Microbiology  none  Antimicrobials: none    Objective: Vitals:   07/03/21 0847 07/03/21 0848 07/03/21 0849 07/03/21 0850  BP:      Pulse: 97 98 96 100  Resp: 16 20 19  (!) 27  Temp:      TempSrc:      SpO2: 98% 98% 97% 98%  Weight:      Height:        Intake/Output Summary (Last 24 hours) at 07/03/2021 1038 Last data filed at 07/03/2021 0900 Gross per 24 hour  Intake 1058 ml  Output 2300 ml  Net -1242 ml  Wt Readings from Last 3 Encounters:  07/03/21 63.9 kg  06/29/21 63.5 kg  06/22/21 65.8 kg    Examination:  Constitutional: tremulous Eyes: lids and conjunctivae normal, no scleral icterus ENMT: mmm Neck: normal, supple Respiratory: clear to auscultation bilaterally, no wheezing, no crackles. Normal respiratory effort.  Cardiovascular: Regular rate and rhythm, no murmurs / rubs / gallops. No LE edema. Abdomen: soft, no distention, no tenderness. Bowel sounds positive.  Skin: no rashes Neurologic: no focal deficits, equal strength  Data Reviewed: I have independently  reviewed following labs and imaging studies   CBC Recent Labs  Lab 06/29/21 0853 07/01/21 0711  WBC 6.8 8.1  HGB 13.1 14.6  HCT 38.2* 43.2  PLT 267 290  MCV 91.2 93.1  MCH 31.3 31.5  MCHC 34.3 33.8  RDW 12.8 12.9  LYMPHSABS 0.8  --   MONOABS 0.8  --   EOSABS 0.0  --   BASOSABS 0.0  --      Recent Labs  Lab 06/29/21 0853 06/30/21 1315 07/01/21 0711 07/01/21 1946 07/02/21 0343 07/02/21 1500 07/03/21 0341 07/03/21 0622 07/03/21 0741  NA 129*   < > 127* 121* 121* 119* 112* 109* 108*  K 3.9   < > 3.6 3.6 3.8 3.5 3.6 3.6  --   CL 93*   < > 91* 88* 89* 85* 76* 76*  --   CO2 24   < > 27 23 24 24 25  20*  --   GLUCOSE 99   < > 101* 97 80 80 74 76  --   BUN <5*   < > 6* 7* <5* <5* <5* <5*  --   CREATININE 0.77   < > 0.69 0.61 0.52* 0.52* 0.40* 0.43*  --   CALCIUM 9.0   < > 9.3 8.0* 8.4* 8.1* 8.5* 8.6*  --   AST 18  --  19  --   --   --  21  --   --   ALT 13  --  14  --   --   --  14  --   --   ALKPHOS 65  --  76  --   --   --  64  --   --   BILITOT 0.5  --  0.8  --   --   --  1.2  --   --   ALBUMIN 3.8  --  4.6  --   --   --  3.8  --   --   MG  --   --  2.2  --   --   --   --   --   --   TSH  --   --   --  0.498  --   --   --   --   --    < > = values in this interval not displayed.     ------------------------------------------------------------------------------------------------------------------ No results for input(s): CHOL, HDL, LDLCALC, TRIG, CHOLHDL, LDLDIRECT in the last 72 hours.  Lab Results  Component Value Date   HGBA1C 5.4 05/07/2021   ------------------------------------------------------------------------------------------------------------------ Recent Labs    07/01/21 1946  TSH 0.498     Cardiac Enzymes No results for input(s): CKMB, TROPONINI, MYOGLOBIN in the last 168 hours.  Invalid input(s): CK ------------------------------------------------------------------------------------------------------------------ No results found for:  BNP  CBG: No results for input(s): GLUCAP in the last 168 hours.  Recent Results (from the past 240 hour(s))  Resp Panel by RT-PCR (Flu A&B, Covid) Nasopharyngeal Swab  Status: None   Collection Time: 06/24/21  7:35 AM   Specimen: Nasopharyngeal Swab; Nasopharyngeal(NP) swabs in vial transport medium  Result Value Ref Range Status   SARS Coronavirus 2 by RT PCR NEGATIVE NEGATIVE Final    Comment: (NOTE) SARS-CoV-2 target nucleic acids are NOT DETECTED.  The SARS-CoV-2 RNA is generally detectable in upper respiratory specimens during the acute phase of infection. The lowest concentration of SARS-CoV-2 viral copies this assay can detect is 138 copies/mL. A negative result does not preclude SARS-Cov-2 infection and should not be used as the sole basis for treatment or other patient management decisions. A negative result may occur with  improper specimen collection/handling, submission of specimen other than nasopharyngeal swab, presence of viral mutation(s) within the areas targeted by this assay, and inadequate number of viral copies(<138 copies/mL). A negative result must be combined with clinical observations, patient history, and epidemiological information. The expected result is Negative.  Fact Sheet for Patients:  EntrepreneurPulse.com.au  Fact Sheet for Healthcare Providers:  IncredibleEmployment.be  This test is no t yet approved or cleared by the [place] FDA and  has been authorized for detection and/or diagnosis of SARS-CoV-2 by FDA under an Emergency Use Authorization (EUA). This EUA will remain  in effect (meaning this test can be used) for the duration of the COVID-19 declaration under Section 564(b)(1) of the Act, 21 U.S.C.section 360bbb-3(b)(1), unless the authorization is terminated  or revoked sooner.       Influenza A by PCR NEGATIVE NEGATIVE Final   Influenza B by PCR NEGATIVE NEGATIVE Final    Comment:  (NOTE) The Xpert Xpress SARS-CoV-2/FLU/RSV plus assay is intended as an aid in the diagnosis of influenza from Nasopharyngeal swab specimens and should not be used as a sole basis for treatment. Nasal washings and aspirates are unacceptable for Xpert Xpress SARS-CoV-2/FLU/RSV testing.  Fact Sheet for Patients: EntrepreneurPulse.com.au  Fact Sheet for Healthcare Providers: IncredibleEmployment.be  This test is not yet approved or cleared by the [place] FDA and has been authorized for detection and/or diagnosis of SARS-CoV-2 by FDA under an Emergency Use Authorization (EUA). This EUA will remain in effect (meaning this test can be used) for the duration of the COVID-19 declaration under Section 564(b)(1) of the Act, 21 U.S.C. section 360bbb-3(b)(1), unless the authorization is terminated or revoked.  Performed at [facility], [address] [place], [place] [address]   Resp Panel by RT-PCR (Flu A&B, Covid) Nasopharyngeal Swab     Status: None   Collection Time: 06/29/21  8:55 AM   Specimen: Nasopharyngeal Swab; Nasopharyngeal(NP) swabs in vial transport medium  Result Value Ref Range Status   SARS Coronavirus 2 by RT PCR NEGATIVE NEGATIVE Final    Comment: (NOTE) SARS-CoV-2 target nucleic acids are NOT DETECTED.  The SARS-CoV-2 RNA is generally detectable in upper respiratory specimens during the acute phase of infection. The lowest concentration of SARS-CoV-2 viral copies this assay can detect is 138 copies/mL. A negative result does not preclude SARS-Cov-2 infection and should not be used as the sole basis for treatment or other patient management decisions. A negative result may occur with  improper specimen collection/handling, submission of specimen other than nasopharyngeal swab, presence of viral mutation(s) within the areas targeted by this assay, and inadequate number of viral copies(<138 copies/mL). A  negative result must be combined with clinical observations, patient history, and epidemiological information. The expected result is Negative.  Fact Sheet for Patients:  EntrepreneurPulse.com.au  Fact Sheet for Healthcare Providers:  IncredibleEmployment.be  This test is no t yet approved or cleared by the [place] and  has been authorized for detection and/or diagnosis of SARS-CoV-2 by FDA under an Emergency Use Authorization (EUA). This EUA will remain  in effect (meaning this test can be used) for the duration of the COVID-19 declaration under Section 564(b)(1) of the Act, 21 U.S.C.section 360bbb-3(b)(1), unless the authorization is terminated  or revoked sooner.       Influenza A by PCR NEGATIVE NEGATIVE Final   Influenza B by PCR NEGATIVE NEGATIVE Final    Comment: (NOTE) The Xpert Xpress SARS-CoV-2/FLU/RSV plus assay is intended as an aid in the diagnosis of influenza from Nasopharyngeal swab specimens and should not be used as a sole basis for treatment. Nasal washings and aspirates are unacceptable for Xpert Xpress SARS-CoV-2/FLU/RSV testing.  Fact Sheet for Patients: EntrepreneurPulse.com.au  Fact Sheet for Healthcare Providers: IncredibleEmployment.be  This test is not yet approved or cleared by the [place] FDA and has been authorized for detection and/or diagnosis of SARS-CoV-2 by FDA under an Emergency Use Authorization (EUA). This EUA will remain in effect (meaning this test can be used) for the duration of the COVID-19 declaration under Section 564(b)(1) of the Act, 21 U.S.C. section 360bbb-3(b)(1), unless the authorization is terminated or revoked.  Performed at [facility], [place] [address] [place], [place] [address]   Resp Panel by RT-PCR (Flu A&B, Covid) Nasopharyngeal Swab     Status: Abnormal   Collection Time: 07/01/21  4:41 PM   Specimen: Nasopharyngeal  Swab; Nasopharyngeal(NP) swabs in vial transport medium  Result Value Ref Range Status   SARS Coronavirus 2 by RT PCR POSITIVE (A) NEGATIVE Final    Comment: (NOTE) SARS-CoV-2 target nucleic acids are DETECTED.  The SARS-CoV-2 RNA is generally detectable in upper respiratory specimens during the acute phase of infection. Positive results are indicative of the presence of the identified virus, but do not rule out bacterial infection or co-infection with other pathogens not detected by the test. Clinical correlation with patient history and other diagnostic information is necessary to determine patient infection status. The expected result is Negative.  Fact Sheet for Patients: EntrepreneurPulse.com.au  Fact Sheet for Healthcare Providers: IncredibleEmployment.be  This test is not yet approved or cleared by the [place] FDA and  has been authorized for detection and/or diagnosis of SARS-CoV-2 by FDA under an Emergency Use Authorization (EUA).  This EUA will remain in effect (meaning this test can be used) for the duration of  the COVID-19 declaration under Section 564(b)(1) of the [place] ct, 21 U.S.C. section 360bbb-3(b)(1), unless the authorization is terminated or revoked sooner.     Influenza A by PCR NEGATIVE NEGATIVE Final   Influenza B by PCR NEGATIVE NEGATIVE Final    Comment: (NOTE) The Xpert Xpress SARS-CoV-2/FLU/RSV plus assay is intended as an aid in the diagnosis of influenza from Nasopharyngeal swab specimens and should not be used as a sole basis for treatment. Nasal washings and aspirates are unacceptable for Xpert Xpress SARS-CoV-2/FLU/RSV testing.  Fact Sheet for Patients: EntrepreneurPulse.com.au  Fact Sheet for Healthcare Providers: IncredibleEmployment.be  This test is not yet approved or cleared by the [place] FDA and has been authorized for detection and/or diagnosis of  SARS-CoV-2 by FDA under an Emergency Use Authorization (EUA). This EUA will remain in effect (meaning this test can be used) for the duration of the COVID-19 declaration under Section 564(b)(1) of the Act, 21 U.S.C. section 360bbb-3(b)(1), unless the authorization is terminated  or revoked.  Performed at [facility], [place] [address] [place], [place] [address]   MRSA Next Gen by PCR, Nasal     Status: None   Collection Time: 07/03/21  8:54 AM   Specimen: Nasal Mucosa; Nasal Swab  Result Value Ref Range Status   MRSA by PCR Next Gen NOT DETECTED NOT DETECTED Final    Comment: (NOTE) The GeneXpert MRSA Assay (FDA approved for NASAL specimens only), is one component of a comprehensive MRSA colonization surveillance program. It is not intended to diagnose MRSA infection nor to guide or monitor treatment for MRSA infections. Test performance is not FDA approved in patients less than 58 years old. Performed at [facility], [place] [address] [place], [place] [address]       Radiology Studies: No results found.   Marzetta Board, MD, PhD [facility]  Between 7 am - 7 pm I am available, please contact me via Amion (for emergencies) or Securechat (non urgent messages)  Between 7 pm - 7 am I am not available, please contact night coverage MD/APP via Amion

## 2021-07-03 NOTE — Progress Notes (Signed)
Patient transferred to 1241 in [place], report given to [facility], Therapist, sports.

## 2021-07-03 NOTE — Plan of Care (Signed)
  Problem: Safety: Goal: Ability to remain free from injury will improve Outcome: Progressing   Problem: Skin Integrity: Goal: Risk for impaired skin integrity will decrease Outcome: Progressing   

## 2021-07-03 NOTE — Consult Note (Addendum)
Renal Service Consult Note [facility]  Anthony Lamb 07/03/2021 Sol Blazing, MD Requesting Physician: Dr. Cruzita Lederer  Reason for Consult: Hyponatremia HPI: The patient is a 66 y.o. year-old w/ hx of etoh abuse, anemia, DJD, chronic diarrhea, chronic pain, gastritis, colitis, depression, HTN, IBS, SIADH who presented to ED on 1/27 after reportedly taking about 60 tablets of duloxetine 51m.  He was not a great historian in the ED but did say he was concerned about the possibility of homelessness and that he wanted to die because of this. No CP, SOB or fevers. In ED temp was 98, R 88 , R 18 and BP 125/ 75. Pt got IV ativan 2 mg x 2, 1 L NS bolus. Poison control was called. CBC was okay, Na was 127, glucose 101 and BUN 6. UDS was +only for BZD's. Pt was admitted for antidepressant OD and suicidal ideations. Na 129 dropped to 127 on 1/27 and down to 121 early am on 1/28. Last night Na dropped to 119 and this am is 112. Asked to see for hyponatremia.  We are asked to see for hyponatremia.    UNa on 1/29 was 98. UA from prior admit was wnl.    Pt is a vague historian. Says he quit etoh about 1 month ago.  No SOB or CP.   Pt was admitted in March 2019 w/ hyponatremia.  His hctz and ACEi were dc'd and Na+ levels improved w/ a fluid restriction from 115 >> 128.    ROS - denies CP, no joint pain, no HA, no blurry vision, no rash, no diarrhea, no nausea/ vomiting, no dysuria, no difficulty voiding   Past Medical History  Past Medical History:  Diagnosis Date   Alcohol abuse, in remission    Anal fissure    Anemia    Ankylosing spondylitis (HCC)    Anxiety    Arthritis    Chronic diarrhea    Chronic headaches    Chronic pain    Colitis    Colon polyps    Depression    Esophagitis    Gastric polyps    hyperplastic and fundic gland   Gastritis    GERD (gastroesophageal reflux disease)    Hypertension    IBS (irritable bowel syndrome)    Poor dentition    SIADH  (syndrome of inappropriate ADH production) [place]    Past Surgical History  Past Surgical History:  Procedure Laterality Date   BIOPSY  11/26/2018   Procedure: BIOPSY;  Surgeon: GGatha Mayer MD;  Location: [facility];  Service: Endoscopy;;   COLONOSCOPY W/ BIOPSIES     COLONOSCOPY WITH PROPOFOL N/A 11/26/2018   Procedure: COLONOSCOPY WITH PROPOFOL;  Surgeon: GGatha Mayer MD;  Location: [facility];  Service: Endoscopy;  Laterality: N/A;   ESOPHAGOGASTRODUODENOSCOPY     ESOPHAGOGASTRODUODENOSCOPY (EGD) WITH PROPOFOL N/A 11/26/2018   Procedure: ESOPHAGOGASTRODUODENOSCOPY (EGD) WITH PROPOFOL;  Surgeon: GGatha Mayer MD;  Location: [facility];  Service: Endoscopy;  Laterality: N/A;   HEMOSTASIS CLIP PLACEMENT  11/26/2018   Procedure: HEMOSTASIS CLIP PLACEMENT;  Surgeon: GGatha Mayer MD;  Location: [facility];  Service: Endoscopy;;   HERNIA REPAIR Bilateral    HOT HEMOSTASIS N/A 11/26/2018   Procedure: HOT HEMOSTASIS (ARGON PLASMA COAGULATION/BICAP);  Surgeon: GGatha Mayer MD;  [facility];  Service: Endoscopy;  Laterality: N/A;   OPEN REDUCTION INTERNAL FIXATION (ORIF) DISTAL RADIAL FRACTURE Right 02/11/2018   Procedure: OPEN REDUCTION INTERNAL FIXATION (ORIF) DISTAL RADIAL FRACTURE;  Surgeon: Milly Jakob, MD;  Location: [place];  Service: Orthopedics;  Laterality: Right;   POLYPECTOMY  11/26/2018   Procedure: POLYPECTOMY;  Surgeon: Gatha Mayer, MD;  [facility];  Service: Endoscopy;;   TONSILLECTOMY     Family History  Family History  Problem Relation Age of Onset   Anxiety disorder Mother    Congestive Heart Failure Mother    Crohn's disease Mother    Colon cancer Mother 46   Arthritis Father    High blood pressure Father    Crohn's disease Father    Social History  reports that he quit smoking about 9 years ago. His smoking use included cigarettes. He has never used smokeless tobacco. He reports that he does not currently use alcohol. He  reports that he does not currently use drugs after having used the following drugs: Marijuana. Allergies  Allergies  Allergen Reactions   Nsaids Other (See Comments)    GI upset- history of peptic ulcers!!   Diphenhydramine Hcl Other (See Comments)    Restlessness   Flexeril [Cyclobenzaprine] Other (See Comments)    Restlessness   Fluoxetine Other (See Comments)    Made the patient feel "worse than before" (treatment)   Hctz [Hydrochlorothiazide] Other (See Comments)    Caused to lose sodium when taking with Lisinopril   Rexulti [Brexpiprazole] Other (See Comments)    "Made me not feel right- restless"   Seroquel [Quetiapine] Other (See Comments)    Restless legs and makes the patient sweat- also does not help patient's insomnia. Pt takes this at home in 2023.   Gabapentin Diarrhea   Hydroxyzine Other (See Comments)    Restlessness   Home medications Prior to Admission medications   Medication Sig Start Date End Date Taking? Authorizing Provider  acetaminophen (TYLENOL) 500 MG tablet Take 500 mg by mouth every 6 (six) hours as needed for mild pain.   Yes [provider]  DULoxetine (CYMBALTA) 30 MG capsule Take 30 mg by mouth daily.   Yes [provider]  lisinopril (ZESTRIL) 10 MG tablet Take 10 mg by mouth at bedtime. 06/05/21  Yes [provider]  LORazepam (ATIVAN) 1 MG tablet Take 1 mg by mouth every 8 (eight) hours.   Yes [provider]  Multiple Vitamin (MULTIVITAMIN WITH MINERALS) TABS tablet Take 1 tablet by mouth daily. (May buy over the counter) Patient taking differently: Take 1 tablet by mouth daily. 06/28/18  Yes Connye Burkitt, NP  pantoprazole (PROTONIX) 40 MG tablet Take 1 tablet (40 mg total) by mouth daily. Patient taking differently: Take 40 mg by mouth daily before breakfast. 04/24/14  Yes Janith Lima, MD  propranolol (INDERAL) 10 MG tablet Take 1 tablet (10 mg total) by mouth 2 (two) times daily. 05/20/21  Yes Parks Ranger, DO  QUEtiapine (SEROQUEL) 300 MG tablet Take 1 tablet (300 mg total) by mouth at bedtime. 05/20/21  Yes Parks Ranger, DO  SALINE NASAL SPRAY NA Place 1 spray into both nostrils daily.   Yes [provider]  sucralfate (CARAFATE) 1 g tablet Take 1 g by mouth 4 (four) times daily. 06/05/21  Yes [provider]  tiZANidine (ZANAFLEX) 4 MG tablet Take 2 mg by mouth every 8 (eight) hours as needed for muscle spasms. 06/05/21  Yes [provider]  traZODone (DESYREL) 50 MG tablet Take 1 tablet (50 mg total) by mouth at bedtime. Patient taking differently: Take 50 mg by mouth at bedtime as needed for  sleep. 05/20/21  Yes Parks Ranger, DO  verapamil (CALAN-SR) 240 MG CR tablet Take 240 mg by mouth at bedtime. 06/07/21  Yes [provider]  zolpidem (AMBIEN) 10 MG tablet Take 1 tablet (10 mg total) by mouth at bedtime. 05/20/21  Yes Parks Ranger, DO  diazepam (VALIUM) 5 MG tablet Take 1 tablet (5 mg total) by mouth 2 (two) times daily. Patient not taking: Reported on 06/30/2021 06/10/21   Carmin Muskrat, MD  FLUoxetine (PROZAC) 20 MG capsule Take 1 capsule (20 mg total) by mouth daily. Patient not taking: Reported on 06/30/2021 05/21/21   Parks Ranger, DO  fluticasone [facility]) 50 MCG/ACT nasal spray Place 1 spray into both nostrils daily for 7 days. Patient not taking: Reported on 07/01/2021 06/24/21 07/01/21  Wynona Dove A, DO  risperiDONE (RISPERDAL) 0.5 MG tablet Take 1 tablet (0.5 mg total) by mouth 2 (two) times daily. Patient not taking: Reported on 06/30/2021 05/20/21   Parks Ranger, DO     Vitals:   07/03/21 0850 07/03/21 1000 07/03/21 1100 07/03/21 1200  BP:   (!) 163/88 (!) 161/88  Pulse: 100 89 91 91  Resp: (!) 27 17 20  (!) 23  Temp:    98.2 F (36.8 C)  TempSrc:    Oral  SpO2: 98% 97% 98% 98%  Weight:      Height:       Exam Gen alert, no distress No rash, cyanosis or gangrene Sclera  anicteric, throat clear  No jvd or bruits Chest clear bilat to bases, no rales/ wheezing RRR no MRG Abd soft ntnd no mass or ascites +bs GU normal MS no joint effusions or deformity Ext no LE or UE edema, no wounds or ulcers Neuro is alert, Ox 3 , nf      Home meds include - lisinopril 10 hs, ativan 1 tid, mVI, protonix, inderal 10 bid, seroquel 300 hs, carafate, zanaflex, desyrel , verapamil 240 LR, ambien, valium prn, prozac, risperdal 0.5 bid       UOP 2775 yesterday, 75 cc yest and 200 cc today    UNa 98 - on 06/2921     UOsm - pending    UA pending       Na 111  K 3.6  Cl 76  CO2 20  Creat 0.43      Plasma osm = 226 (275- 295 mOsm/kg)   TSH wnl  Assessment/ Plan: Hyponatremia - in patient w/ etoh history and depression who took overdose of duloxetine 34m due to suicidal ideation.  In hospital the highest sodium was 129.  Then it fell duwn to 121 yesterday and down to 112 today.  Pt poor historian. TSH wnl, cortisol not ordered. UNa is high, UOsm is pending. This suggests SIADH. If a drinker low solute is another possibility.  Recommend for now to start 3% saline at 40 cc/hr and f/u Na+ levels every 4 hrs. Also would do 1200 cc fluid restriction and avoid any IVF"s, acei/ ARB/ hctz for now. Will follow.  COVID infection ETOH abuse Drug overdose - per pmd, pt is alert for the most part HTN - BP's are high, consider lower or dc midodrine H/o orthostatic hypotension      RKelly Splinter MD 07/03/2021, 1:54 PM  Recent Labs  Lab 06/29/21 0853 07/01/21 0711  WBC 6.8 8.1  HGB 13.1 14.6   Recent Labs  Lab 07/01/21 0711 07/01/21 1946 07/03/21 0341 07/03/21 0622  K 3.6   < >  3.6 3.6  BUN 6*   < > <5* <5*  CREATININE 0.69   < > 0.40* 0.43*  ALBUMIN 4.6  --  3.8  --   CALCIUM 9.3   < > 8.5* 8.6*   < > = values in this interval not displayed.

## 2021-07-04 ENCOUNTER — Encounter (HOSPITAL_COMMUNITY): Payer: Self-pay

## 2021-07-04 DIAGNOSIS — I10 Essential (primary) hypertension: Secondary | ICD-10-CM

## 2021-07-04 DIAGNOSIS — E871 Hypo-osmolality and hyponatremia: Secondary | ICD-10-CM | POA: Diagnosis not present

## 2021-07-04 DIAGNOSIS — T43202A Poisoning by unspecified antidepressants, intentional self-harm, initial encounter: Secondary | ICD-10-CM

## 2021-07-04 DIAGNOSIS — T50902A Poisoning by unspecified drugs, medicaments and biological substances, intentional self-harm, initial encounter: Secondary | ICD-10-CM | POA: Diagnosis not present

## 2021-07-04 LAB — COMPREHENSIVE METABOLIC PANEL
ALT: 15 U/L (ref 0–44)
AST: 22 U/L (ref 15–41)
Albumin: 3.4 g/dL — ABNORMAL LOW (ref 3.5–5.0)
Alkaline Phosphatase: 55 U/L (ref 38–126)
Anion gap: 8 (ref 5–15)
BUN: 7 mg/dL — ABNORMAL LOW (ref 8–23)
CO2: 24 mmol/L (ref 22–32)
Calcium: 8.2 mg/dL — ABNORMAL LOW (ref 8.9–10.3)
Chloride: 88 mmol/L — ABNORMAL LOW (ref 98–111)
Creatinine, Ser: 0.57 mg/dL — ABNORMAL LOW (ref 0.61–1.24)
GFR, Estimated: >60 mL/min (ref >60)
Glucose, Bld: 79 mg/dL (ref 70–99)
Potassium: 3.5 mmol/L (ref 3.5–5.1)
Sodium: 120 mmol/L — ABNORMAL LOW (ref 135–145)
Total Bilirubin: 0.7 mg/dL (ref 0.3–1.2)
Total Protein: 5.9 g/dL — ABNORMAL LOW (ref 6.5–8.1)

## 2021-07-04 LAB — BASIC METABOLIC PANEL
Anion gap: 8 (ref 5–15)
BUN: 8 mg/dL (ref 8–23)
CO2: 27 mmol/L (ref 22–32)
Calcium: 8.4 mg/dL — ABNORMAL LOW (ref 8.9–10.3)
Chloride: 85 mmol/L — ABNORMAL LOW (ref 98–111)
Creatinine, Ser: 0.5 mg/dL — ABNORMAL LOW (ref 0.61–1.24)
GFR, Estimated: >60 mL/min (ref >60)
Glucose, Bld: 106 mg/dL — ABNORMAL HIGH (ref 70–99)
Potassium: 3.5 mmol/L (ref 3.5–5.1)
Sodium: 120 mmol/L — ABNORMAL LOW (ref 135–145)

## 2021-07-04 LAB — CBC
HCT: 33.1 % — ABNORMAL LOW (ref 39.0–52.0)
Hemoglobin: 12.1 g/dL — ABNORMAL LOW (ref 13.0–17.0)
MCH: 31.7 pg (ref 26.0–34.0)
MCHC: 36.6 g/dL — ABNORMAL HIGH (ref 30.0–36.0)
MCV: 86.6 fL (ref 80.0–100.0)
Platelets: 249 10*3/uL (ref 150–400)
RBC: 3.82 MIL/uL — ABNORMAL LOW (ref 4.22–5.81)
RDW: 11.9 % (ref 11.5–15.5)
WBC: 5.2 10*3/uL (ref 4.0–10.5)
nRBC: 0 % (ref 0.0–0.2)

## 2021-07-04 MED ORDER — OXYCODONE HCL 5 MG PO TABS
5.0000 mg | ORAL_TABLET | ORAL | Status: DC | PRN
  Administered 2021-07-04 – 2021-07-05 (×3): 5 mg via ORAL
  Filled 2021-07-04 (×3): qty 1

## 2021-07-04 MED ORDER — ORAL CARE MOUTH RINSE
15.0000 mL | Freq: Two times a day (BID) | OROMUCOSAL | Status: DC
  Administered 2021-07-04 – 2021-08-22 (×77): 15 mL via OROMUCOSAL

## 2021-07-04 MED ORDER — PHENOL 1.4 % MT LIQD
1.0000 | OROMUCOSAL | Status: DC | PRN
  Administered 2021-07-04 – 2021-07-11 (×5): 1 via OROMUCOSAL
  Filled 2021-07-04: qty 177

## 2021-07-04 MED ORDER — POLYETHYLENE GLYCOL 3350 17 G PO PACK
17.0000 g | PACK | Freq: Every day | ORAL | Status: DC | PRN
  Administered 2021-07-04 – 2021-07-06 (×2): 17 g via ORAL
  Filled 2021-07-04 (×2): qty 1

## 2021-07-04 NOTE — Progress Notes (Signed)
PROGRESS NOTE    COURTNEY FENLON  HER:[id number] DOB: 09-27-1955 DOA: 07/01/2021 PCP: Sandi Mariscal, MD    Brief Narrative:  66 year old male with history of EtOH abuse, ankylosing spondylitis, anxiety, depression, history of colitis and chronic diarrhea, chronic headaches, chronic pain, hypertension, comes into the hospital after taking 60 tablets of duloxetine.  He has had multiple ER visits since September for anxiety, suicidal ideation, pain and other issues.  He was concerned about becoming homeless and wanted to die because of this.  He was admitted to the hospital, poison control consulted as well as psychiatry.  He was also found to be COVID-positive   Assessment & Plan:   Principal Problem:   Antidepressant overdose Active Problems:   Essential hypertension   Hyponatremia   Gastroesophageal reflux disease   Suicidal ideations   Overdose  Principal problem Antidepressant overdose, suicidal ideation-continue suicide precautions, sitter.  Psychiatry consulted, recommending inpatient psychiatric admission when medically cleared.   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  Chest x-ray unremarkable.  On room air.  Has risk factors, he is 66 years old, continued on Molnupiravir   DVT prophylaxis: Lovenox subq Code Status: Full Family Communication: Pt in room, family not at bedside  Status is: Inpatient  Remains inpatient appropriate because: Severity of illness    Consultants:  Nephrology  Procedures:    Antimicrobials: Anti-infectives  (From admission, onward)    Start     Dose/Rate Route Frequency Ordered Stop   07/01/21 2200  nirmatrelvir/ritonavir EUA (PAXLOVID) 3 tablet  Status:  Discontinued        3 tablet Oral 2 times daily 07/01/21 1900 07/01/21 1935   07/01/21 2200  molnupiravir EUA (LAGEVRIO) capsule 800 mg        4 capsule Oral 2 times daily 07/01/21 1935 07/06/21 2159       Subjective: Complaining of feeling weak, has tremors  Objective: Vitals:   07/04/21 1000 07/04/21 1100 07/04/21 1200 07/04/21 1300  BP: (!) 168/84 (!) 172/79 (!) 163/82 (!) 157/81  Pulse: 84 90 82 93  Resp: (!) 37 17 18 (!) 25  Temp:   98.2 F (36.8 C)   TempSrc:   Oral   SpO2: 98% 99% 99% 98%  Weight:      Height:        Intake/Output Summary (Last 24 hours) at 07/04/2021 1348 Last data filed at 07/04/2021 1104 Gross per 24 hour  Intake 1636.98 ml  Output 2020 ml  Net -383.02 ml   Filed Weights   07/01/21 0649 07/03/21 0845  Weight: 63.5 kg 63.9 kg    Examination: General exam: Awake, laying in bed, in nad Respiratory system: Normal respiratory effort, no wheezing Cardiovascular system: perfused, no notable jvd Gastrointestinal system: Soft, nondistended, nontender Central nervous system: CN2-12 grossly intact, strength intact Extremities: Perfused, no clubbing, no LE edema Skin: no notable skin lesions seen, no rashes Psychiatry: appears anxious // no visual hallucinations   Data Reviewed: I have personally reviewed following labs and imaging studies  CBC: Recent Labs  Lab 06/29/21 0853 07/01/21  7342 07/04/21 0305  WBC 6.8 8.1 5.2  NEUTROABS 5.2  --   --   HGB 13.1 14.6 12.1*  HCT 38.2* 43.2 33.1*  MCV 91.2 93.1 86.6  PLT 267 290 876   Basic Metabolic Panel: Recent Labs  Lab 07/01/21 0711 07/01/21 1946 07/02/21 1500 07/03/21 0341 07/03/21 0622 07/03/21 0741 07/03/21 1031 07/03/21 1324 07/03/21 1619 07/03/21 2211 07/04/21 0305  NA 127*   < > 119* 112* 109*   < > 110* 111* 112* 115* 120*   K 3.6   < > 3.5 3.6 3.6  --   --   --  3.3*  --  3.5  CL 91*   < > 85* 76* 76*  --   --   --  79*  --  88*  CO2 27   < > 24 25 20*  --   --   --  23  --  24  GLUCOSE 101*   < > 80 74 76  --   --   --  78  --  79  BUN 6*   < > <5* <5* <5*  --   --   --  5*  --  7*  CREATININE 0.69   < > 0.52* 0.40* 0.43*  --   --   --  0.48*  --  0.57*  CALCIUM 9.3   < > 8.1* 8.5* 8.6*  --   --   --  8.0*  --  8.2*  MG 2.2  --   --   --   --   --   --   --   --   --   --    < > = values in this interval not displayed.   GFR: Estimated Creatinine Clearance: 83.2 mL/min (A) (by C-G formula based on SCr of 0.57 mg/dL (L)). Liver Function Tests: Recent Labs  Lab 06/29/21 0853 07/01/21 0711 07/03/21 0341 07/04/21 0305  AST 18 19 21 22   ALT 13 14 14 15   ALKPHOS 65 76 64 55  BILITOT 0.5 0.8 1.2 0.7  PROT 6.2* 7.6 6.5 5.9*  ALBUMIN 3.8 4.6 3.8 3.4*   No results for input(s): LIPASE, AMYLASE in the last 168 hours. No results for input(s): AMMONIA in the last 168 hours. Coagulation Profile: No results for input(s): INR, PROTIME in the last 168 hours. Cardiac Enzymes: No results for input(s): CKTOTAL, CKMB, CKMBINDEX, TROPONINI in the last 168 hours. BNP (last 3 results) No results for input(s): PROBNP in the last 8760 hours. HbA1C: No results for input(s): HGBA1C in the last 72 hours. CBG: No results for input(s): GLUCAP in the last 168 hours. Lipid Profile: No results for input(s): CHOL, HDL, LDLCALC, TRIG, CHOLHDL, LDLDIRECT in the last 72 hours. Thyroid Function Tests: Recent Labs    07/01/21 1946  TSH 0.498   Anemia Panel: No results for input(s): VITAMINB12, FOLATE, FERRITIN, TIBC, IRON, RETICCTPCT in the last 72 hours. Sepsis Labs: No results for input(s): PROCALCITON, LATICACIDVEN in the last 168 hours.  Recent Results (from the past 240 hour(s))  Resp Panel by RT-PCR (Flu A&B, Covid) Nasopharyngeal Swab     Status: None   Collection Time: 06/29/21  8:55 AM   Specimen:  Nasopharyngeal Swab; Nasopharyngeal(NP) swabs in vial transport medium  Result Value Ref Range Status   SARS Coronavirus 2 by RT PCR NEGATIVE NEGATIVE Final    Comment: (NOTE) SARS-CoV-2 target nucleic acids are NOT DETECTED.  The SARS-CoV-2 RNA is generally detectable in  upper respiratory specimens during the acute phase of infection. The lowest concentration of SARS-CoV-2 viral copies this assay can detect is 138 copies/mL. A negative result does not preclude SARS-Cov-2 infection and should not be used as the sole basis for treatment or other patient management decisions. A negative result may occur with  improper specimen collection/handling, submission of specimen other than nasopharyngeal swab, presence of viral mutation(s) within the areas targeted by this assay, and inadequate number of viral copies(<138 copies/mL). A negative result must be combined with clinical observations, patient history, and epidemiological information. The expected result is Negative.  Fact Sheet for Patients:  EntrepreneurPulse.com.au  Fact Sheet for Healthcare Providers:  IncredibleEmployment.be  This test is no t yet approved or cleared by the [place] FDA and  has been authorized for detection and/or diagnosis of SARS-CoV-2 by FDA under an Emergency Use Authorization (EUA). This EUA will remain  in effect (meaning this test can be used) for the duration of the COVID-19 declaration under Section 564(b)(1) of the Act, 21 U.S.C.section 360bbb-3(b)(1), unless the authorization is terminated  or revoked sooner.       Influenza A by PCR NEGATIVE NEGATIVE Final   Influenza B by PCR NEGATIVE NEGATIVE Final    Comment: (NOTE) The Xpert Xpress SARS-CoV-2/FLU/RSV plus assay is intended as an aid in the diagnosis of influenza from Nasopharyngeal swab specimens and should not be used as a sole basis for treatment. Nasal washings and aspirates are unacceptable for  Xpert Xpress SARS-CoV-2/FLU/RSV testing.  Fact Sheet for Patients: EntrepreneurPulse.com.au  Fact Sheet for Healthcare Providers: IncredibleEmployment.be  This test is not yet approved or cleared by the [place] FDA and has been authorized for detection and/or diagnosis of SARS-CoV-2 by FDA under an Emergency Use Authorization (EUA). This EUA will remain in effect (meaning this test can be used) for the duration of the COVID-19 declaration under Section 564(b)(1) of the Act, 21 U.S.C. section 360bbb-3(b)(1), unless the authorization is terminated or revoked.  Performed at [facility], [place] [address], [place], [place] [address]   Resp Panel by RT-PCR (Flu A&B, Covid) Nasopharyngeal Swab     Status: Abnormal   Collection Time: 07/01/21  4:41 PM   Specimen: Nasopharyngeal Swab; Nasopharyngeal(NP) swabs in vial transport medium  Result Value Ref Range Status   SARS Coronavirus 2 by RT PCR POSITIVE (A) NEGATIVE Final    Comment: (NOTE) SARS-CoV-2 target nucleic acids are DETECTED.  The SARS-CoV-2 RNA is generally detectable in upper respiratory specimens during the acute phase of infection. Positive results are indicative of the presence of the identified virus, but do not rule out bacterial infection or co-infection with other pathogens not detected by the test. Clinical correlation with patient history and other diagnostic information is necessary to determine patient infection status. The expected result is Negative.  Fact Sheet for Patients: EntrepreneurPulse.com.au  Fact Sheet for Healthcare Providers: IncredibleEmployment.be  This test is not yet approved or cleared by the [place] FDA and  has been authorized for detection and/or diagnosis of SARS-CoV-2 by FDA under an Emergency Use Authorization (EUA).  This EUA will remain in effect (meaning this test can be used) for the duration  of  the COVID-19 declaration under Section 564(b)(1) of the A ct, 21 U.S.C. section 360bbb-3(b)(1), unless the authorization is terminated or revoked sooner.     Influenza A by PCR NEGATIVE NEGATIVE Final   Influenza B by PCR NEGATIVE NEGATIVE Final    Comment: (NOTE) The Xpert Xpress SARS-CoV-2/FLU/RSV  plus assay is intended as an aid in the diagnosis of influenza from Nasopharyngeal swab specimens and should not be used as a sole basis for treatment. Nasal washings and aspirates are unacceptable for Xpert Xpress SARS-CoV-2/FLU/RSV testing.  Fact Sheet for Patients: EntrepreneurPulse.com.au  Fact Sheet for Healthcare Providers: IncredibleEmployment.be  This test is not yet approved or cleared by the [place] FDA and has been authorized for detection and/or diagnosis of SARS-CoV-2 by FDA under an Emergency Use Authorization (EUA). This EUA will remain in effect (meaning this test can be used) for the duration of the COVID-19 declaration under Section 564(b)(1) of the Act, 21 U.S.C. section 360bbb-3(b)(1), unless the authorization is terminated or revoked.  Performed at [facility], [place] [address], [place], [place] [address]   MRSA Next Gen by PCR, Nasal     Status: None   Collection Time: 07/03/21  8:54 AM   Specimen: Nasal Mucosa; Nasal Swab  Result Value Ref Range Status   MRSA by PCR Next Gen NOT DETECTED NOT DETECTED Final    Comment: (NOTE) The GeneXpert MRSA Assay (FDA approved for NASAL specimens only), is one component of a comprehensive MRSA colonization surveillance program. It is not intended to diagnose MRSA infection nor to guide or monitor treatment for MRSA infections. Test performance is not FDA approved in patients less than 15 years old. Performed at [facility], [place] [address], [place], [place] [address]      Radiology Studies: No results found.  Scheduled Meds:   Chlorhexidine Gluconate Cloth  6 each Topical Daily   enoxaparin (LOVENOX) injection  40 mg Subcutaneous Q24H   LORazepam  1 mg Oral Q8H   mouth rinse  15 mL Mouth Rinse BID   molnupiravir EUA  4 capsule Oral BID   pantoprazole  40 mg Oral QAC breakfast   sucralfate  1 g Oral QID   zolpidem  5 mg Oral QHS   Continuous Infusions:   LOS: 2 days   Marylu Lund, MD [facility] Pager On Amion  If 7PM-7AM, please contact night-coverage 07/04/2021, 1:48 PM

## 2021-07-04 NOTE — Progress Notes (Signed)
Patient ID: Anthony Lamb, male   DOB: Apr 24, 1956, 66 y.o.   MRN: [id number] [facility] Progress Note   Assessment/ Plan:   1.  Hyponatremia: Likely from significant inappropriate ADH activation in the setting of duloxetine overdose likely in a patient with underlying mild hyponatremia from EtOH use.  It appears that 3% saline was not started overnight with sodium levels improving spontaneously-up to 120 today.  We will continue fluid restriction and continue to hold thiazide/RAAS blockers while following labs. 2.  COVID-19 infection: Appears an incidental finding without significant respiratory symptoms.  Ongoing management per primary service. 3.  Hypertension: Continue to follow blood pressures closely to decide on need to initiate antihypertensive therapy to replace lisinopril and verapamil that are currently on hold. 4.  Duloxetine overdose: Continue suicidal precautions and management per poison control.  Subjective:   Reports to be anxious and concerned about his electrolytes.   Objective:   BP (!) 157/81    Pulse 93    Temp 98.2 F (36.8 C) (Oral)    Resp (!) 25    Ht 5' 10"  (1.778 m)    Wt 63.9 kg    SpO2 98%    BMI 20.21 kg/m   Intake/Output Summary (Last 24 hours) at 07/04/2021 1411 Last data filed at 07/04/2021 1104 Gross per 24 hour  Intake 1276.98 ml  Output 2020 ml  Net -743.02 ml   Weight change:   Physical Exam: Gen: Appears comfortable resting in bed, with global tremor CVS: Pulse regular rhythm, normal rate, S1 and S2 normal Resp: Clear to auscultation bilaterally, no rales/rhonchi Abd: Soft, flat, nontender, bowel sounds normal Ext: No lower extremity edema  Imaging: No results found.  Labs: BMET Recent Labs  Lab 07/01/21 1946 07/02/21 0343 07/02/21 1500 07/03/21 0341 07/03/21 0622 07/03/21 0741 07/03/21 1031 07/03/21 1324 07/03/21 1619 07/03/21 2211 07/04/21 0305  NA 121* 121* 119* 112* 109* 108* 110* 111* 112* 115* 120*  K  3.6 3.8 3.5 3.6 3.6  --   --   --  3.3*  --  3.5  CL 88* 89* 85* 76* 76*  --   --   --  79*  --  88*  CO2 23 24 24 25  20*  --   --   --  23  --  24  GLUCOSE 97 80 80 74 76  --   --   --  78  --  79  BUN 7* <5* <5* <5* <5*  --   --   --  5*  --  7*  CREATININE 0.61 0.52* 0.52* 0.40* 0.43*  --   --   --  0.48*  --  0.57*  CALCIUM 8.0* 8.4* 8.1* 8.5* 8.6*  --   --   --  8.0*  --  8.2*   CBC Recent Labs  Lab 06/29/21 0853 07/01/21 0711 07/04/21 0305  WBC 6.8 8.1 5.2  NEUTROABS 5.2  --   --   HGB 13.1 14.6 12.1*  HCT 38.2* 43.2 33.1*  MCV 91.2 93.1 86.6  PLT 267 290 249    Medications:     Chlorhexidine Gluconate Cloth  6 each Topical Daily   enoxaparin (LOVENOX) injection  40 mg Subcutaneous Q24H   LORazepam  1 mg Oral Q8H   mouth rinse  15 mL Mouth Rinse BID   molnupiravir EUA  4 capsule Oral BID   pantoprazole  40 mg Oral QAC breakfast   sucralfate  1 g Oral QID  zolpidem  5 mg Oral QHS   Elmarie Shiley, MD 07/04/2021, 2:11 PM

## 2021-07-04 NOTE — Consult Note (Signed)
[facility] Consult   Reason for Consult:  Suicide attempt by overdose on cymbalta Referring Physician:  Dr. Olevia Bowens Patient Identification: Anthony Lamb MRN:  [id number] Principal Diagnosis: Antidepressant overdose Diagnosis:  Principal Problem:   Antidepressant overdose Active Problems:   Essential hypertension   Hyponatremia   Gastroesophageal reflux disease   Suicidal ideations   Overdose   Total Time spent with patient: 20 minutes  Subjective:   Anthony Lamb is a 66 y.o. male patient admitted with suicide attempt by overdose.  Patient presented after overdosing on (60) 60 mg tablets of Cymbalta.  Patient is seen and assessed by this nurse practitioner. Patient is observed lying in bed, with nursing staff performing his ADLs. Patient states " I feel bad. I feel real bad physically. " He is unable to describe why or what is making him feel bad? We reviewed his sodium levels, and holding of his certain medications. He seems to comprehend this, as he follows up with request to not discontinue his Ambien. He states he needs this medication so that he can sleep. He then proceeds to tell me that he is too weak to walk and cant go to a psychiatric facility. His thought process remains tangential and scattered. He continues to ruminate about not wanting to go into inpatient psych at this time.   He does present with improvement in his mental state as he denies suicidal ideation at this time. He continues to require routine Ativan 28m po TID, for treatment and prevention of serotonin syndrome. Once medically stable he will benefit from inpatient psychiatric admission.   HPI:  Anthony HAIRis a 66y.o. male with a past medical history significant for hypertension, GERD, peptic ulcer disease, ankylosing spondylitis, irritable bowel syndrome, chronic pain, anxiety, and depression who presents for suicide attempt by overdose.  According to EMS and patient, at 545 this  morning, patient tried to overdose by taking 60 duloxetine tablets.  EMS thought that they were 60 mg tablets by report.  Patient reports she is feeling some waxing waning nausea, lightheadedness, and feels like he has no bowel movement.  He feels very dehydrated and tired.  He also is feeling very anxious and agitated.  He still says he wants to kill himself.  [facility] he is going to try to kill himself he leaves again today.  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  Past Medical History:  Past Medical History:  Diagnosis Date   Alcohol abuse, in remission    Anal fissure    Anemia    Ankylosing spondylitis (HCC)    Anxiety    Arthritis    Chronic diarrhea    Chronic headaches    Chronic pain    Colitis    Colon polyps    Depression    Esophagitis    Gastric polyps    hyperplastic and fundic gland   Gastritis    GERD (gastroesophageal reflux disease)    Hypertension    IBS (irritable bowel syndrome)    Poor dentition    SIADH (syndrome of inappropriate ADH production) ([facility]     Past Surgical History:  Procedure Laterality Date   BIOPSY  11/26/2018   Procedure: BIOPSY;  Surgeon: Gatha Mayer, MD;  Location: [facility];  Service: Endoscopy;;   COLONOSCOPY W/ BIOPSIES     COLONOSCOPY WITH PROPOFOL N/A 11/26/2018   Procedure: COLONOSCOPY WITH PROPOFOL;  Surgeon: Gatha Mayer, MD;  Location: [facility];  Service: Endoscopy;  Laterality: N/A;   ESOPHAGOGASTRODUODENOSCOPY     ESOPHAGOGASTRODUODENOSCOPY (EGD) WITH PROPOFOL N/A 11/26/2018   Procedure:  ESOPHAGOGASTRODUODENOSCOPY (EGD) WITH PROPOFOL;  Surgeon: Gatha Mayer, MD;  Location[facility];  Service: Endoscopy;  Laterality: N/A;   HEMOSTASIS CLIP PLACEMENT  11/26/2018   Procedure: HEMOSTASIS CLIP PLACEMENT;  Surgeon: Gatha Mayer, MD;  Location: [facility];  Service: Endoscopy;;   HERNIA REPAIR Bilateral    HOT HEMOSTASIS N/A 11/26/2018   Procedure: HOT HEMOSTASIS (ARGON PLASMA COAGULATION/BICAP);  Surgeon: Gatha Mayer, MD;  [facility];  Service: Endoscopy;  Laterality: N/A;   OPEN REDUCTION INTERNAL FIXATION (ORIF) DISTAL RADIAL FRACTURE Right 02/11/2018   Procedure: OPEN REDUCTION INTERNAL FIXATION (ORIF) DISTAL RADIAL FRACTURE;  Surgeon: Milly Jakob, MD;  Location: [place];  Service: Orthopedics;  Laterality: Right;   POLYPECTOMY  11/26/2018   Procedure: POLYPECTOMY;  Surgeon: Gatha Mayer, MD;  Location: [facility];  Service: Endoscopy;;   TONSILLECTOMY     Family History:  Family History  Problem Relation Age of Onset   Anxiety disorder Mother    Congestive Heart Failure Mother    Crohn's disease Mother    Colon cancer Mother 60   Arthritis Father    High blood pressure Father    Crohn's disease Father    Family Psychiatric  History: Denies Social History:  Social History   Substance and Sexual Activity  Alcohol Use Not Currently     Social History   Substance and Sexual Activity  Drug Use Not Currently   Types: Marijuana    Social History   Socioeconomic History   Marital status: Single    Spouse name: Not on file   Number of children: 0   Years of education: Not on file   Highest education level: Not on file  Occupational History   Not on file  Tobacco Use   Smoking status: Former    Types: Cigarettes    Quit date: 2014    Years since quitting: 9.0   Smokeless tobacco: Never  Vaping Use   Vaping Use: Never used  Substance and Sexual Activity   Alcohol use: Not Currently   Drug use: Not Currently    Types: Marijuana    Sexual activity: Not Currently  Other Topics Concern   Not on file  Social History Narrative   HSG. Long - term disability - unable to work. Lived with his mother in her house - she died May 13, 2023 -    Lives in apartment   Has case worker   Social Determinants of Radio broadcast assistant Strain: Not on file  Food Insecurity: Not on file  Transportation Needs: Not on file  Physical Activity: Not on file  Stress: Not on file  Social Connections: Not on file   Additional Social History:    Allergies:   Allergies  Allergen Reactions   Nsaids Other (See Comments)    GI upset- history of peptic ulcers!!   Diphenhydramine Hcl Other (See Comments)    Restlessness   Flexeril [Cyclobenzaprine] Other (See Comments)    Restlessness   Fluoxetine Other (See Comments)    Made the  patient feel "worse than before" (treatment)   Hctz [Hydrochlorothiazide] Other (See Comments)    Caused to lose sodium when taking with Lisinopril   Rexulti [Brexpiprazole] Other (See Comments)    "Made me not feel right- restless"   Seroquel [Quetiapine] Other (See Comments)    Restless legs and makes the patient sweat- also does not help patient's insomnia. Pt takes this at home in 2023.   Gabapentin Diarrhea   Hydroxyzine Other (See Comments)    Restlessness    Labs:  Results for orders placed or performed during the hospital encounter of 07/01/21 (from the past 48 hour(s))  Comprehensive metabolic panel     Status: Abnormal   Collection Time: 07/03/21  3:41 AM  Result Value Ref Range   Sodium 112 (LL) 135 - 145 mmol/L    Comment: CRITICAL RESULT CALLED TO, READ BACK BY AND VERIFIED WITH: TAMEKA, RN @ [phone] ON 07/03/2021 BY LBROOKS, MLT    Potassium 3.6 3.5 - 5.1 mmol/L   Chloride 76 (L) 98 - 111 mmol/L   CO2 25 22 - 32 mmol/L   Glucose, Bld 74 70 - 99 mg/dL    Comment: Glucose reference range applies only to samples taken after fasting for at least 8 hours.   BUN <5 (L) 8 - 23 mg/dL    Creatinine, Ser 0.40 (L) 0.61 - 1.24 mg/dL   Calcium 8.5 (L) 8.9 - 10.3 mg/dL   Total Protein 6.5 6.5 - 8.1 g/dL   Albumin 3.8 3.5 - 5.0 g/dL   AST 21 15 - 41 U/L   ALT 14 0 - 44 U/L   Alkaline Phosphatase 64 38 - 126 U/L   Total Bilirubin 1.2 0.3 - 1.2 mg/dL   GFR, Estimated >60 >60 mL/min    Comment: (NOTE) Calculated using the CKD-EPI Creatinine Equation (2021)    Anion gap 11 5 - 15    Comment: Performed at [facility], [place] [address] [place], [place] [address]  Basic metabolic panel     Status: Abnormal   Collection Time: 07/03/21  6:22 AM  Result Value Ref Range   Sodium 109 (LL) 135 - 145 mmol/L    Comment: CRITICAL RESULT CALLED TO, READ BACK BY AND VERIFIED WITH: BULLIN,M. RN AT 6283 07/03/21 MULLINS,T     Potassium 3.6 3.5 - 5.1 mmol/L   Chloride 76 (L) 98 - 111 mmol/L   CO2 20 (L) 22 - 32 mmol/L   Glucose, Bld 76 70 - 99 mg/dL    Comment: Glucose reference range applies only to samples taken after fasting for at least 8 hours.   BUN <5 (L) 8 - 23 mg/dL   Creatinine, Ser 0.43 (L) 0.61 - 1.24 mg/dL   Calcium 8.6 (L) 8.9 - 10.3 mg/dL   GFR, Estimated >60 >60 mL/min    Comment: (NOTE) Calculated using the CKD-EPI Creatinine Equation (2021)    Anion gap 13 5 - 15    Comment: Performed at [facility], [place] [address] [place], [place] [address]  Sodium     Status: Abnormal   Collection Time: 07/03/21  7:41 AM  Result Value Ref Range   Sodium 108 (LL) 135 - 145 mmol/L    Comment: CRITICAL RESULT CALLED TO, READ BACK BY AND VERIFIED WITH: Jerrye Noble RN ON 07/03/21 @ 1607 BY GOLSONM Performed at [place] [address] [place], [place] [address]   Osmolality     Status: Abnormal   Collection Time:  07/03/21  7:41 AM  Result Value Ref Range   Osmolality 226 (LL) 275 - 295 mOsm/kg    Comment: REPEATED TO VERIFY CRITICAL RESULT CALLED TO, READ BACK BY AND VERIFIED WITH: Rod Holler RN.@1225  ON 1.29.23 BY  TCALDWELL MT. Performed at [facility], [place] [address], [place], [place] [address]   MRSA Next Gen by PCR, Nasal     Status: None   Collection Time: 07/03/21  8:54 AM   Specimen: Nasal Mucosa; Nasal Swab  Result Value Ref Range   MRSA by PCR Next Gen NOT DETECTED NOT DETECTED    Comment: (NOTE) The GeneXpert MRSA Assay (FDA approved for NASAL specimens only), is one component of a comprehensive MRSA colonization surveillance program. It is not intended to diagnose MRSA infection nor to guide or monitor treatment for MRSA infections. Test performance is not FDA approved in patients less than 4 years old. Performed at [facility], [place] [address] [place], [place] [address]   Sodium     Status: Abnormal   Collection Time: 07/03/21 10:31 AM  Result Value Ref Range   Sodium 110 (LL) 135 - 145 mmol/L    Comment: CRITICAL RESULT CALLED TO, READ BACK BY AND VERIFIED WITH: Tyson Babinski RN AT 1115 07/03/21 MULLINS,T Performed at [facility], [place] [address] [place], [place] [address]   Osmolality, urine     Status: None   Collection Time: 07/03/21 11:40 AM  Result Value Ref Range   Osmolality, Ur 652 300 - 900 mOsm/kg    Comment: Performed at [place] [address] [place], [place] [address]  Sodium, urine, random     Status: None   Collection Time: 07/03/21 11:40 AM  Result Value Ref Range   Sodium, Ur 98 mmol/L    Comment: Performed at [facility], [place] [address], [place], [place] [address]  Sodium     Status: Abnormal   Collection Time: 07/03/21  1:24 PM  Result Value Ref Range   Sodium 111 (LL) 135 - 145 mmol/L    Comment: CRITICAL RESULT CALLED TO, READ BACK BY AND VERIFIED WITH: Tyson Babinski RN AT 6384 07/03/21 MULLINS,T  Performed at [facility], [place] [address] [place], [place] [address]   Basic metabolic panel     Status: Abnormal   Collection Time: 07/03/21  4:19 PM  Result  Value Ref Range   Sodium 112 (LL) 135 - 145 mmol/L    Comment: CRITICAL RESULT CALLED TO, READ BACK BY AND VERIFIED WITH: GARCIA,T. RN AT 3570 07/03/21 MULLINS,T    Potassium 3.3 (L) 3.5 - 5.1 mmol/L   Chloride 79 (L) 98 - 111 mmol/L   CO2 23 22 - 32 mmol/L   Glucose, Bld 78 70 - 99 mg/dL    Comment: Glucose reference range applies only to samples taken after fasting for at least 8 hours.   BUN 5 (L) 8 - 23 mg/dL   Creatinine, Ser 0.48 (L) 0.61 - 1.24 mg/dL   Calcium 8.0 (L) 8.9 - 10.3 mg/dL   GFR, Estimated >60 >60 mL/min    Comment: (NOTE) Calculated using the CKD-EPI Creatinine Equation (2021)    Anion gap 10 5 - 15    Comment: Performed at [facility], [place] [address], [place],  [address]  Sodium     Status: Abnormal   Collection Time: 07/03/21 10:11 PM  Result Value Ref Range   Sodium 115 (LL) 135 - 145  mmol/L    Comment: CRITICAL RESULT CALLED TO, READ BACK BY AND VERIFIED WITH: CAMI, RN @ 2308 ON 07/03/2021 BY Ruffin Frederick, MLT Performed at [facility], [place] [address], [place], [place] [address]   CBC     Status: Abnormal   Collection Time: 07/04/21  3:05 AM  Result Value Ref Range   WBC 5.2 4.0 - 10.5 K/uL   RBC 3.82 (L) 4.22 - 5.81 MIL/uL   Hemoglobin 12.1 (L) 13.0 - 17.0 g/dL   HCT 33.1 (L) 39.0 - 52.0 %   MCV 86.6 80.0 - 100.0 fL   MCH 31.7 26.0 - 34.0 pg   MCHC 36.6 (H) 30.0 - 36.0 g/dL   RDW 11.9 11.5 - 15.5 %   Platelets 249 150 - 400 K/uL   nRBC 0.0 0.0 - 0.2 %    Comment: Performed at [facility], [place] [address] [place], [place] [address]  Comprehensive metabolic panel     Status: Abnormal   Collection Time: 07/04/21  3:05 AM  Result Value Ref Range   Sodium 120 (L) 135 - 145 mmol/L   Potassium 3.5 3.5 - 5.1 mmol/L   Chloride 88 (L) 98 - 111 mmol/L   CO2 24 22 - 32 mmol/L   Glucose, Bld 79 70 - 99 mg/dL    Comment: Glucose reference range applies only to samples taken after fasting for at  least 8 hours.   BUN 7 (L) 8 - 23 mg/dL   Creatinine, Ser 0.57 (L) 0.61 - 1.24 mg/dL   Calcium 8.2 (L) 8.9 - 10.3 mg/dL   Total Protein 5.9 (L) 6.5 - 8.1 g/dL   Albumin 3.4 (L) 3.5 - 5.0 g/dL   AST 22 15 - 41 U/L   ALT 15 0 - 44 U/L   Alkaline Phosphatase 55 38 - 126 U/L   Total Bilirubin 0.7 0.3 - 1.2 mg/dL   GFR, Estimated >60 >60 mL/min    Comment: (NOTE) Calculated using the CKD-EPI Creatinine Equation (2021)    Anion gap 8 5 - 15    Comment: Performed at [facility], [place] [address], [place], [place] [address]    Current Facility-Administered Medications  Medication Dose Route Frequency Provider Last Rate Last Admin   acetaminophen (TYLENOL) tablet 650 mg  650 mg Oral Q6H PRN Reubin Milan, MD   650 mg at 07/03/21 1017   Or   acetaminophen (TYLENOL) suppository 650 mg  650 mg Rectal Q6H PRN Reubin Milan, MD       Chlorhexidine Gluconate Cloth 2 % PADS 6 each  6 each Topical Daily Caren Griffins, MD   6 each at 07/04/21 1554   enoxaparin (LOVENOX) injection 40 mg  40 mg Subcutaneous Q24H Reubin Milan, MD   40 mg at 07/03/21 2135   LORazepam (ATIVAN) tablet 1 mg  1 mg Oral Q8H Reubin Milan, MD   1 mg at 07/04/21 1338   MEDLINE mouth rinse  15 mL Mouth Rinse BID Donne Hazel, MD       molnupiravir EUA (LAGEVRIO) capsule 800 mg  4 capsule Oral BID Tu, Ching T, DO   800 mg at 07/04/21 5102   oxyCODONE (Oxy IR/ROXICODONE) immediate release tablet 5 mg  5 mg Oral Q6H PRN Caren Griffins, MD   5 mg at 07/04/21 1212   pantoprazole (PROTONIX) EC tablet 40 mg  40 mg Oral QAC breakfast Reubin Milan, MD   40 mg  at 07/04/21 0845   phenol (CHLORASEPTIC) mouth spray 1 spray  1 spray Mouth/Throat PRN Donne Hazel, MD   1 spray at 07/04/21 1213   polyethylene glycol (MIRALAX / GLYCOLAX) packet 17 g  17 g Oral Daily PRN Donne Hazel, MD       prochlorperazine (COMPAZINE) injection 5 mg  5 mg Intravenous Q6H PRN Reubin Milan,  MD   5 mg at 07/03/21 9233   sucralfate (CARAFATE) tablet 1 g  1 g Oral QID Reubin Milan, MD   1 g at 07/04/21 1338   zolpidem (AMBIEN) tablet 5 mg  5 mg Oral QHS Reubin Milan, MD   5 mg at 07/03/21 2133    Musculoskeletal: Strength & Muscle Tone: within normal limits Gait & Station: normal Patient leans: N/A     Psychiatric Specialty Exam:  Presentation  General Appearance: Appropriate for Environment; Disheveled  Eye Contact:Fair  Speech:Clear and Coherent; Normal Rate  Speech Volume:Normal  Handedness:Right   Mood and Affect  Mood:Anxious  Affect:Appropriate; Congruent   Thought Process  Thought Processes:Linear  Descriptions of Associations:Tangential  Orientation:Full (Time, Place and Person)  Thought Content:Scattered; Logical  History of Schizophrenia/Schizoaffective disorder:No  Duration of Psychotic Symptoms:Less than six months  Hallucinations:Hallucinations: None  Ideas of Reference:None  Suicidal Thoughts:Suicidal Thoughts: No  Homicidal Thoughts:Homicidal Thoughts: No   Sensorium  Memory:Immediate Fair; Recent Fair; Remote Fair  Judgment:Intact  Insight:Poor   Executive Functions  Concentration:Poor  Attention Span:Fair  [place]   Psychomotor Activity  Psychomotor Activity:Psychomotor Activity: Normal   Assets  Assets:Communication Skills; Desire for Improvement; Physical Health; Financial Resources/Insurance   Sleep  Sleep:Sleep: Fair   Physical Exam: Physical Exam Vitals and nursing note reviewed.  Constitutional:      Appearance: Normal appearance. He is normal weight.  HENT:     Head: Normocephalic.  Eyes:     General: Lids are normal.  Neurological:     General: No focal deficit present.     Mental Status: He is alert and oriented to person, place, and time.     Motor: Weakness and tremor present. No atrophy.  Psychiatric:        Attention and  Perception: Attention and perception normal.        Mood and Affect: Mood is anxious.        Speech: Speech normal.        Behavior: Behavior normal. Behavior is cooperative.        Thought Content: Thought content normal.        Cognition and Memory: Cognition and memory normal.        Judgment: Judgment normal.   Review of Systems  Neurological:  Positive for tremors, focal weakness and weakness.  Psychiatric/Behavioral:  Positive for depression, substance abuse (alcohol use, last drank 1 month ago) and suicidal ideas (suicide attempt). The patient is nervous/anxious and has insomnia (takes [place]).   All other systems reviewed and are negative. Blood pressure (!) 168/83, pulse 99, temperature 98.2 F (36.8 C), temperature source Oral, resp. rate 20, height 5' 10"  (1.778 m), weight 63.9 kg, SpO2 99 %. Body mass index is 20.21 kg/m.    Treatment Plan Summary: Daily contact with patient to assess and evaluate symptoms and progress in treatment, Medication management, and Plan     Recommended for inpatient psychiatric treatment. Patient unable to contract for safety.  If no appropriate bed at [facility] unit;  fax to surrounding facilities for  appropriate bed. -Patient will be placed on involuntary commitment at this time, as he is a danger to himself and unable to contract for safety. -Once medically stable will begin benzodiazepine taper to include Klonopin 1 mg p.o. twice daily.   -continue to hold seroquel 340m po qhs, not actively psychotic and has no known history of such.  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 Continue 1:1 sAir cabin crew Repeat BMP is pending at this time.   Disposition: Recommend psychiatric Inpatient admission when medically cleared. IVC  TSuella Broad FNP 07/04/2021 4:02 PM

## 2021-07-05 ENCOUNTER — Inpatient Hospital Stay (HOSPITAL_COMMUNITY): Payer: Medicare Other

## 2021-07-05 DIAGNOSIS — E871 Hypo-osmolality and hyponatremia: Secondary | ICD-10-CM | POA: Diagnosis not present

## 2021-07-05 DIAGNOSIS — I10 Essential (primary) hypertension: Secondary | ICD-10-CM | POA: Diagnosis not present

## 2021-07-05 LAB — COMPREHENSIVE METABOLIC PANEL
ALT: 16 U/L (ref 0–44)
AST: 21 U/L (ref 15–41)
Albumin: 3.6 g/dL (ref 3.5–5.0)
Alkaline Phosphatase: 56 U/L (ref 38–126)
Anion gap: 10 (ref 5–15)
BUN: 7 mg/dL — ABNORMAL LOW (ref 8–23)
CO2: 26 mmol/L (ref 22–32)
Calcium: 8.7 mg/dL — ABNORMAL LOW (ref 8.9–10.3)
Chloride: 82 mmol/L — ABNORMAL LOW (ref 98–111)
Creatinine, Ser: 0.51 mg/dL — ABNORMAL LOW (ref 0.61–1.24)
GFR, Estimated: >60 mL/min (ref >60)
Glucose, Bld: 98 mg/dL (ref 70–99)
Potassium: 3.3 mmol/L — ABNORMAL LOW (ref 3.5–5.1)
Sodium: 118 mmol/L — CL (ref 135–145)
Total Bilirubin: 0.5 mg/dL (ref 0.3–1.2)
Total Protein: 6.4 g/dL — ABNORMAL LOW (ref 6.5–8.1)

## 2021-07-05 LAB — SODIUM
Sodium: 118 mmol/L — CL (ref 135–145)
Sodium: 118 mmol/L — CL (ref 135–145)
Sodium: 119 mmol/L — CL (ref 135–145)

## 2021-07-05 MED ORDER — POTASSIUM CHLORIDE CRYS ER 20 MEQ PO TBCR
20.0000 meq | EXTENDED_RELEASE_TABLET | Freq: Two times a day (BID) | ORAL | Status: AC
  Administered 2021-07-05 – 2021-07-06 (×3): 20 meq via ORAL
  Filled 2021-07-05 (×3): qty 1

## 2021-07-05 MED ORDER — LISINOPRIL 10 MG PO TABS
10.0000 mg | ORAL_TABLET | Freq: Every day | ORAL | Status: DC
  Administered 2021-07-05 – 2021-07-07 (×3): 10 mg via ORAL
  Filled 2021-07-05 (×3): qty 1

## 2021-07-05 MED ORDER — OXYCODONE HCL 5 MG PO TABS
5.0000 mg | ORAL_TABLET | Freq: Four times a day (QID) | ORAL | Status: DC | PRN
  Administered 2021-07-05 – 2021-07-06 (×2): 5 mg via ORAL
  Filled 2021-07-05 (×2): qty 1

## 2021-07-05 NOTE — Progress Notes (Signed)
MD notified of pt's c/o neck pain after standing at bedside during therapy, pt also states that it is not a new pain and he has had it before, tylenol given for pain, nausea med given as well. VS wnl as charted. X ray ordered. Sitter at bedside noted that pt only stood for few seconds and no contact was made to neck or upper body by PT. We will monitor closely for worsening pain.

## 2021-07-05 NOTE — Progress Notes (Signed)
PROGRESS NOTE    Anthony Lamb  WTU:[id number] DOB: 1956-04-13 DOA: 07/01/2021 PCP: Sandi Mariscal, MD    Brief Narrative:  66 year old male with history of EtOH abuse, ankylosing spondylitis, anxiety, depression, history of colitis and chronic diarrhea, chronic headaches, chronic pain, hypertension, comes into the hospital after taking 60 tablets of duloxetine.  He has had multiple ER visits since September for anxiety, suicidal ideation, pain and other issues.  He was concerned about becoming homeless and wanted to die because of this.  He was admitted to the hospital, poison control consulted as well as psychiatry.  He was also found to be COVID-positive   Assessment & Plan:   Principal Problem:   Antidepressant overdose Active Problems:   Essential hypertension   Hyponatremia   Gastroesophageal reflux disease   Suicidal ideations   Overdose  Principal problem Antidepressant overdose, suicidal ideation-continue suicide precautions, sitter.  Psychiatry consulted, recommending inpatient psychiatric admission when medically cleared.   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  Chest x-ray unremarkable.  On room air.  Has risk factors, he is 66 years old, continued on Molnupiravir   DVT prophylaxis: Lovenox subq Code Status: Full Family Communication: Pt in room, family not at bedside  Status is: Inpatient  Remains inpatient appropriate because:  Severity of illness    Consultants:  Nephrology  Procedures:    Antimicrobials: Anti-infectives (From admission, onward)    Start     Dose/Rate Route Frequency Ordered Stop   07/01/21 2200  nirmatrelvir/ritonavir EUA (PAXLOVID) 3 tablet  Status:  Discontinued        3 tablet Oral 2 times daily 07/01/21 1900 07/01/21 1935   07/01/21 2200  molnupiravir EUA (LAGEVRIO) capsule 800 mg        4 capsule Oral 2 times daily 07/01/21 1935 07/06/21 2159       Subjective: Reports feeling very weak  Objective: Vitals:   07/05/21 1000 07/05/21 1100 07/05/21 1219 07/05/21 1316  BP: (!) 117/92   (!) 152/71  Pulse: 96 94  85  Resp: 19 (!) 24  18  Temp:   98.3 F (36.8 C)   TempSrc:   Axillary   SpO2: 98% 98%  98%  Weight:      Height:        Intake/Output Summary (Last 24 hours) at 07/05/2021 1341 Last data filed at 07/05/2021 1000 Gross per 24 hour  Intake 600 ml  Output 650 ml  Net -50 ml    Filed Weights   07/01/21 0649 07/03/21 0845  Weight: 63.5 kg 63.9 kg    Examination: General exam: Conversant, in no acute distress Respiratory system: normal chest rise, clear, no audible wheezing Cardiovascular system: regular rhythm, s1-s2 Gastrointestinal system: Nondistended, nontender, pos BS Central nervous system: No seizures, no tremors Extremities: No cyanosis, no joint deformities Skin: No rashes, no pallor Psychiatry: Affect normal // no auditory hallucinations   Data Reviewed: I have personally reviewed following labs and imaging studies  CBC: Recent  Labs  Lab 06/29/21 0853 07/01/21 0711 07/04/21 0305  WBC 6.8 8.1 5.2  NEUTROABS 5.2  --   --   HGB 13.1 14.6 12.1*  HCT 38.2* 43.2 33.1*  MCV 91.2 93.1 86.6  PLT 267 290 263    Basic Metabolic Panel: Recent Labs  Lab 07/01/21 0711 07/01/21 1946 07/03/21 0622 07/03/21 0741 07/03/21 1619 07/03/21 2211 07/04/21 0305 07/04/21 1614 07/04/21 2358 07/05/21 0353 07/05/21 0756 07/05/21 1211  NA 127*   < >  109*   < > 112*   < > 120* 120* 118* 118* 118* 119*  K 3.6   < > 3.6  --  3.3*  --  3.5 3.5  --  3.3*  --   --   CL 91*   < > 76*  --  79*  --  88* 85*  --  82*  --   --   CO2 27   < > 20*  --  23  --  24 27  --  26  --   --   GLUCOSE 101*   < > 76  --  78  --  79 106*  --  98  --   --   BUN 6*   < > <5*  --  5*  --  7* 8  --  7*  --   --   CREATININE 0.69   < > 0.43*  --  0.48*  --  0.57* 0.50*  --  0.51*  --   --   CALCIUM 9.3   < > 8.6*  --  8.0*  --  8.2* 8.4*  --  8.7*  --   --   MG 2.2  --   --   --   --   --   --   --   --   --   --   --    < > = values in this interval not displayed.    GFR: Estimated Creatinine Clearance: 83.2 mL/min (A) (by C-G formula based on SCr of 0.51 mg/dL (L)). Liver Function Tests: Recent Labs  Lab 06/29/21 0853 07/01/21 0711 07/03/21 0341 07/04/21 0305 07/05/21 0353  AST 18 19 21 22 21   ALT 13 14 14 15 16   ALKPHOS 65 76 64 55 56  BILITOT 0.5 0.8 1.2 0.7 0.5  PROT 6.2* 7.6 6.5 5.9* 6.4*  ALBUMIN 3.8 4.6 3.8 3.4* 3.6    No results for input(s): LIPASE, AMYLASE in the last 168 hours. No results for input(s): AMMONIA in the last 168 hours. Coagulation Profile: No results for input(s): INR, PROTIME in the last 168 hours. Cardiac Enzymes: No results for input(s): CKTOTAL, CKMB, CKMBINDEX, TROPONINI in the last 168 hours. BNP (last 3 results) No results for input(s): PROBNP in the last 8760 hours. HbA1C: No results for input(s): HGBA1C in the last 72 hours. CBG: No results for input(s): GLUCAP in the last 168 hours. Lipid Profile: No results for input(s): CHOL, HDL, LDLCALC, TRIG, CHOLHDL, LDLDIRECT in the last 72 hours. Thyroid Function Tests: No results for input(s): TSH, T4TOTAL, FREET4, T3FREE, THYROIDAB in the last 72 hours.  Anemia Panel: No results for input(s): VITAMINB12, FOLATE, FERRITIN, TIBC, IRON, RETICCTPCT in the last 72 hours. Sepsis Labs: No results for input(s): PROCALCITON, LATICACIDVEN in the last 168 hours.  Recent  Results (from the past 240 hour(s))  Resp Panel by RT-PCR (Flu A&B, Covid) Nasopharyngeal Swab     Status: None   Collection Time: 06/29/21  8:55 AM   Specimen: Nasopharyngeal Swab; Nasopharyngeal(NP) swabs in vial transport medium  Result Value Ref Range Status   SARS Coronavirus 2 by RT PCR NEGATIVE NEGATIVE Final    Comment: (NOTE) SARS-CoV-2 target nucleic acids are NOT DETECTED.  The SARS-CoV-2 RNA is generally detectable in upper respiratory specimens during the acute phase of infection. The lowest concentration of SARS-CoV-2 viral copies this assay can detect is 138 copies/mL. A negative result does not preclude SARS-Cov-2 infection and should not be used as the sole basis for treatment or other patient management decisions. A negative result may occur with  improper specimen collection/handling, submission of specimen other than nasopharyngeal swab, presence of viral mutation(s) within the areas targeted by this assay, and inadequate number of viral copies(<138 copies/mL). A negative result must be combined with clinical observations, patient history, and epidemiological information. The expected result is Negative.  Fact Sheet for Patients:  EntrepreneurPulse.com.au  Fact Sheet for Healthcare Providers:  IncredibleEmployment.be  This test is no t yet approved or cleared by the [place] FDA and  has been authorized for detection and/or diagnosis of SARS-CoV-2 by FDA under an Emergency Use Authorization (EUA). This EUA will remain  in effect (meaning this test can be used) for the duration of the COVID-19 declaration under Section 564(b)(1) of the Act, 21 U.S.C.section 360bbb-3(b)(1), unless the authorization is terminated  or revoked sooner.       Influenza A by PCR NEGATIVE NEGATIVE Final   Influenza B by PCR NEGATIVE NEGATIVE Final    Comment: (NOTE) The Xpert Xpress SARS-CoV-2/FLU/RSV plus assay is intended as an aid in the  diagnosis of influenza from Nasopharyngeal swab specimens and should not be used as a sole basis for treatment. Nasal washings and aspirates are unacceptable for Xpert Xpress SARS-CoV-2/FLU/RSV testing.  Fact Sheet for Patients: EntrepreneurPulse.com.au  Fact Sheet for Healthcare Providers: IncredibleEmployment.be  This test is not yet approved or cleared by the [place] FDA and has been authorized for detection and/or diagnosis of SARS-CoV-2 by FDA under an Emergency Use Authorization (EUA). This EUA will remain in effect (meaning this test can be used) for the duration of the COVID-19 declaration under Section 564(b)(1) of the Act, 21 U.S.C. section 360bbb-3(b)(1), unless the authorization is terminated or revoked.  Performed at [facility], [place] [address], [place], [place] [address]   Resp Panel by RT-PCR (Flu A&B, Covid) Nasopharyngeal Swab     Status: Abnormal   Collection Time: 07/01/21  4:41 PM   Specimen: Nasopharyngeal Swab; Nasopharyngeal(NP) swabs in vial transport medium  Result Value Ref Range Status   SARS Coronavirus 2 by RT PCR POSITIVE (A) NEGATIVE Final    Comment: (NOTE) SARS-CoV-2 target nucleic acids are DETECTED.  The SARS-CoV-2 RNA is generally detectable in upper respiratory specimens during the acute phase of infection. Positive results are indicative of the presence of the identified virus, but do not rule out bacterial infection or co-infection with other pathogens not detected by the test. Clinical correlation with patient history and other diagnostic information is necessary to determine patient infection status. The expected result is Negative.  Fact Sheet for Patients: EntrepreneurPulse.com.au  Fact Sheet for Healthcare Providers: IncredibleEmployment.be  This test is not yet approved or cleared by the [place] FDA and  has been authorized for detection  and/or diagnosis of SARS-CoV-2 by FDA under an Emergency Use Authorization (EUA).  This EUA will remain in effect (meaning this test can be used) for the duration of  the  COVID-19 declaration under Section 564(b)(1) of the A ct, 21 U.S.C. section 360bbb-3(b)(1), unless the authorization is terminated or revoked sooner.     Influenza A by PCR NEGATIVE NEGATIVE Final   Influenza B by PCR NEGATIVE NEGATIVE Final    Comment: (NOTE) The Xpert Xpress SARS-CoV-2/FLU/RSV plus assay is intended as an aid in the diagnosis of influenza from Nasopharyngeal swab specimens and should not be used as a sole basis for treatment. Nasal washings and aspirates are unacceptable for Xpert Xpress SARS-CoV-2/FLU/RSV testing.  Fact Sheet for Patients: EntrepreneurPulse.com.au  Fact Sheet for Healthcare Providers: IncredibleEmployment.be  This test is not yet approved or cleared by the [place] FDA and has been authorized for detection and/or diagnosis of SARS-CoV-2 by FDA under an Emergency Use Authorization (EUA). This EUA will remain in effect (meaning this test can be used) for the duration of the COVID-19 declaration under Section 564(b)(1) of the Act, 21 U.S.C. section 360bbb-3(b)(1), unless the authorization is terminated or revoked.  Performed at [facility], [place] [address], [place], [place] [address]   MRSA Next Gen by PCR, Nasal     Status: None   Collection Time: 07/03/21  8:54 AM   Specimen: Nasal Mucosa; Nasal Swab  Result Value Ref Range Status   MRSA by PCR Next Gen NOT DETECTED NOT DETECTED Final    Comment: (NOTE) The GeneXpert MRSA Assay (FDA approved for NASAL specimens only), is one component of a comprehensive MRSA colonization surveillance program. It is not intended to diagnose MRSA infection nor to guide or monitor treatment for MRSA infections. Test performance is not FDA approved in patients less than 59  years old. Performed at [facility], [place] [address] [place], [place] [address]       Radiology Studies: No results found.  Scheduled Meds:  Chlorhexidine Gluconate Cloth  6 each Topical Daily   enoxaparin (LOVENOX) injection  40 mg Subcutaneous Q24H   LORazepam  1 mg Oral Q8H   mouth rinse  15 mL Mouth Rinse BID   molnupiravir EUA  4 capsule Oral BID   pantoprazole  40 mg Oral QAC breakfast   potassium chloride  20 mEq Oral BID   sucralfate  1 g Oral QID   zolpidem  5 mg Oral QHS   Continuous Infusions:   LOS: 3 days   Marylu Lund, MD [facility] Pager On Amion  If 7PM-7AM, please contact night-coverage 07/05/2021, 1:41 PM

## 2021-07-05 NOTE — Progress Notes (Signed)
Patient ID: Anthony Lamb, male   DOB: 14-May-1956, 66 y.o.   MRN: [id number] [facility] Progress Note   Assessment/ Plan:   1.  Hyponatremia: Likely from significant inappropriate ADH activation in the setting of duloxetine overdose likely in a patient with underlying mild hyponatremia from EtOH use.  Unfortunately, because he is in isolation and was unaware-he was not restricting his daily fluid intake which I have asked him to limit to 40 ounces (1.2 L a day).  I will start him on 3% saline if sodium level deteriorates further or neurological symptoms/tremor worsens. 2.  COVID-19 infection: Appears an incidental finding without significant respiratory symptoms.  Ongoing management per primary service. 3.  Hypertension: Continue to follow blood pressures closely to decide on need to initiate antihypertensive therapy to replace lisinopril and verapamil that are currently on hold. 4.  Duloxetine overdose: Continue suicidal precautions and management per poison control.  Subjective:   Reports to be without any physical complaints at this time but is bothered by tremor that apparently increased this morning.   Objective:   BP (!) 117/92    Pulse 94    Temp 98.3 F (36.8 C) (Axillary)    Resp (!) 24    Ht 5' 10"  (1.778 m)    Wt 63.9 kg    SpO2 98%    BMI 20.21 kg/m   Intake/Output Summary (Last 24 hours) at 07/05/2021 1259 Last data filed at 07/05/2021 1000 Gross per 24 hour  Intake 720 ml  Output 850 ml  Net -130 ml   Weight change:   Physical Exam: Gen: Appears comfortable resting in bed, with global tremor.  Sitter at bedside CVS: Pulse regular rhythm, normal rate, S1 and S2 normal Resp: Clear to auscultation bilaterally, no rales/rhonchi Abd: Soft, flat, nontender, bowel sounds normal Ext: No lower extremity edema  Imaging: No results found.  Labs: BMET Recent Labs  Lab 07/02/21 1500 07/03/21 0341 07/03/21 0622 07/03/21 0741 07/03/21 1619 07/03/21 2211  07/04/21 0305 07/04/21 1614 07/04/21 2358 07/05/21 0353 07/05/21 0756 07/05/21 1211  NA 119* 112* 109*   < > 112* 115* 120* 120* 118* 118* 118* 119*  K 3.5 3.6 3.6  --  3.3*  --  3.5 3.5  --  3.3*  --   --   CL 85* 76* 76*  --  79*  --  88* 85*  --  82*  --   --   CO2 24 25 20*  --  23  --  24 27  --  26  --   --   GLUCOSE 80 74 76  --  78  --  79 106*  --  98  --   --   BUN <5* <5* <5*  --  5*  --  7* 8  --  7*  --   --   CREATININE 0.52* 0.40* 0.43*  --  0.48*  --  0.57* 0.50*  --  0.51*  --   --   CALCIUM 8.1* 8.5* 8.6*  --  8.0*  --  8.2* 8.4*  --  8.7*  --   --    < > = values in this interval not displayed.   CBC Recent Labs  Lab 06/29/21 0853 07/01/21 0711 07/04/21 0305  WBC 6.8 8.1 5.2  NEUTROABS 5.2  --   --   HGB 13.1 14.6 12.1*  HCT 38.2* 43.2 33.1*  MCV 91.2 93.1 86.6  PLT 267 290 249  Medications:     Chlorhexidine Gluconate Cloth  6 each Topical Daily   enoxaparin (LOVENOX) injection  40 mg Subcutaneous Q24H   LORazepam  1 mg Oral Q8H   mouth rinse  15 mL Mouth Rinse BID   molnupiravir EUA  4 capsule Oral BID   pantoprazole  40 mg Oral QAC breakfast   potassium chloride  20 mEq Oral BID   sucralfate  1 g Oral QID   zolpidem  5 mg Oral QHS   Elmarie Shiley, MD 07/05/2021, 12:59 PM

## 2021-07-05 NOTE — Evaluation (Addendum)
Physical Therapy Evaluation Patient Details Name: Anthony Lamb MRN: [id number] DOB: 1956-01-23 Today's Date: 07/05/2021  History of Present Illness  66 year old male with history of EtOH abuse, ankylosing spondylitis, anxiety, depression, history of colitis and chronic diarrhea, chronic headaches, chronic pain, hypertension, comes into the hospital after taking 60 tablets of duloxetine.  He has had multiple ER visits since September for anxiety, suicidal ideation, pain and other issues. Incidental positive for covid.  Clinical Impression  The patient is very restless, frequently stating " I can't walk." Patient impulsive. Patient did stand with 2 persons at Rw, posterior bias, Attempts to take steps were limited by patient's inability to participate, stating that he was dizzy and getting sick. Patient complained of posterior neck pain when sitting..  Patient currently is unable to safely ambulate without support. Pt admitted with above diagnosis. Pt currently with functional limitations due to the deficits listed below (see PT Problem List). Pt will benefit from skilled PT to increase their independence and safety with mobility to allow discharge to the venue listed below.          Recommendations for follow up therapy are one component of a multi-disciplinary discharge planning process, led by the attending physician.  Recommendations may be updated based on patient status, additional functional criteria and insurance authorization.  Follow Up Recommendations Skilled nursing-short term rehab (<3 hours/day)    Assistance Recommended at Discharge Frequent or constant Supervision/Assistance  Patient can return home with the following  A lot of help with bathing/dressing/bathroom;A lot of help with walking and/or transfers;Assistance with cooking/housework;Direct supervision/assist for medications management;Assist for transportation;Help with stairs or ramp for entrance    Equipment  Recommendations None recommended by PT  Recommendations for Other Services       Functional Status Assessment Patient has had a recent decline in their functional status and demonstrates the ability to make significant improvements in function in a reasonable and predictable amount of time.     Precautions / Restrictions Precautions Precautions: Fall      Mobility  Bed Mobility Overal bed mobility: Needs Assistance Bed Mobility: Supine to Sit, Sit to Supine     Supine to sit: Min guard Sit to supine: Min guard   General bed mobility comments: patient impulsive, starting to sit up with rails up, lines tangled, then returned to supine quickly" I am going to be sick. I can't walk".    Transfers Overall transfer level: Needs assistance Equipment used: Rolling walker (2 wheels) Transfers: Sit to/from Stand Sit to Stand: +2 physical assistance, +2 safety/equipment, Mod assist, Max assist           General transfer comment: Patient stood from bed x 2, posterior bias. Attemppted 2 steps forward and back with max support.    Ambulation/Gait                  Stairs            Wheelchair Mobility    Modified Rankin (Stroke Patients Only)       Balance Overall balance assessment: Needs assistance Sitting-balance support: Bilateral upper extremity supported, Feet supported Sitting balance-Leahy Scale: Poor   Postural control: Posterior lean Standing balance support: During functional activity, Reliant on assistive device for balance, Bilateral upper extremity supported Standing balance-Leahy Scale: Poor                               Pertinent Vitals/Pain Pain Assessment Pain Assessment:  Faces Faces Pain Scale: Hurts whole lot Pain Location: back of head Pain Descriptors / Indicators: Aching, Discomfort, Moaning, Nagging Pain Intervention(s): Limited activity within patient's tolerance, Monitored during session    Home Living  Family/patient expects to be discharged to:: Shelter/Homeless Living Arrangements: Alone                 Additional Comments: states that he will lose his apt and everything today-end of month    Prior Function Prior Level of Function : Independent/Modified Independent                     Hand Dominance        Extremity/Trunk Assessment   Upper Extremity Assessment Upper Extremity Assessment: Generalized weakness (tremulous)    Lower Extremity Assessment Lower Extremity Assessment: Generalized weakness;RLE deficits/detail;LLE deficits/detail RLE Deficits / Details: more tremors on right foot.  leg tremors, ataxic LLE Deficits / Details: similar bur less trmors    Cervical / Trunk Assessment Cervical / Trunk Assessment: Kyphotic  Communication   Communication: No difficulties  Cognition Arousal/Alertness: Awake/alert Behavior During Therapy: Impulsive, Anxious, Restless Overall Cognitive Status: No family/caregiver present to determine baseline cognitive functioning Area of Impairment: Orientation, Safety/judgement                 Orientation Level: Time, Situation       Safety/Judgement: Decreased awareness of deficits, Decreased awareness of safety     General Comments: constantly states" I can't walk".        General Comments      Exercises     Assessment/Plan    PT Assessment Patient needs continued PT services  PT Problem List Decreased strength;Decreased balance;Decreased cognition;Decreased knowledge of precautions;Decreased mobility;Decreased activity tolerance;Decreased safety awareness       PT Treatment Interventions DME instruction;Therapeutic activities;Cognitive remediation;Patient/family education;Gait training;Balance training;Functional mobility training    PT Goals (Current goals can be found in the Care Plan section)  Acute Rehab PT Goals PT Goal Formulation: Patient unable to participate in goal setting Time For  Goal Achievement: 07/19/21 Potential to Achieve Goals: Fair    Frequency Min 2X/week     Co-evaluation               AM-PAC PT "6 Clicks" Mobility  Outcome Measure Help needed turning from your back to your side while in a flat bed without using bedrails?: A Little Help needed moving from lying on your back to sitting on the side of a flat bed without using bedrails?: A Little Help needed moving to and from a bed to a chair (including a wheelchair)?: A Lot Help needed standing up from a chair using your arms (e.g., wheelchair or bedside chair)?: A Lot Help needed to walk in hospital room?: Total Help needed climbing 3-5 steps with a railing? : Total 6 Click Score: 12    End of Session Equipment Utilized During Treatment: Gait belt Activity Tolerance: Treatment limited secondary to agitation Patient left: in bed;with call bell/phone within reach;with nursing/sitter in room;with bed alarm set Nurse Communication: Mobility status PT Visit Diagnosis: Unsteadiness on feet (R26.81);Other symptoms and signs involving the nervous system (R29.898)    Time: 9201-0071 PT Time Calculation (min) (ACUTE ONLY): 22 min   Charges:   PT Evaluation $PT Eval Low Complexity: [facility] Pager [phone] Office [phone]   Claretha Cooper 07/05/2021, 2:59 PM

## 2021-07-05 NOTE — Progress Notes (Signed)
Pt states his neck was hurt and is causing him a lot of dizziness during his physical therapy evaluation. He states that he knew he shouldn't have gotten up and that he ruined his neck by doing so. There was no injury or touching of his neck when getting him up out of bed. However he was unsteady and leaning backwards.

## 2021-07-06 DIAGNOSIS — U071 COVID-19: Secondary | ICD-10-CM

## 2021-07-06 DIAGNOSIS — T43202A Poisoning by unspecified antidepressants, intentional self-harm, initial encounter: Secondary | ICD-10-CM | POA: Diagnosis not present

## 2021-07-06 DIAGNOSIS — T50902A Poisoning by unspecified drugs, medicaments and biological substances, intentional self-harm, initial encounter: Secondary | ICD-10-CM | POA: Diagnosis not present

## 2021-07-06 DIAGNOSIS — F1010 Alcohol abuse, uncomplicated: Secondary | ICD-10-CM

## 2021-07-06 DIAGNOSIS — E871 Hypo-osmolality and hyponatremia: Secondary | ICD-10-CM | POA: Diagnosis not present

## 2021-07-06 LAB — CBC
HCT: 34.5 % — ABNORMAL LOW (ref 39.0–52.0)
Hemoglobin: 12.6 g/dL — ABNORMAL LOW (ref 13.0–17.0)
MCH: 31.8 pg (ref 26.0–34.0)
MCHC: 36.5 g/dL — ABNORMAL HIGH (ref 30.0–36.0)
MCV: 87.1 fL (ref 80.0–100.0)
Platelets: 278 10*3/uL (ref 150–400)
RBC: 3.96 MIL/uL — ABNORMAL LOW (ref 4.22–5.81)
RDW: 11.7 % (ref 11.5–15.5)
WBC: 6.9 10*3/uL (ref 4.0–10.5)
nRBC: 0 % (ref 0.0–0.2)

## 2021-07-06 LAB — COMPREHENSIVE METABOLIC PANEL
ALT: 15 U/L (ref 0–44)
AST: 20 U/L (ref 15–41)
Albumin: 3.7 g/dL (ref 3.5–5.0)
Alkaline Phosphatase: 59 U/L (ref 38–126)
Anion gap: 9 (ref 5–15)
BUN: 8 mg/dL (ref 8–23)
CO2: 28 mmol/L (ref 22–32)
Calcium: 8.6 mg/dL — ABNORMAL LOW (ref 8.9–10.3)
Chloride: 80 mmol/L — ABNORMAL LOW (ref 98–111)
Creatinine, Ser: 0.43 mg/dL — ABNORMAL LOW (ref 0.61–1.24)
GFR, Estimated: >60 mL/min (ref >60)
Glucose, Bld: 96 mg/dL (ref 70–99)
Potassium: 3.8 mmol/L (ref 3.5–5.1)
Sodium: 117 mmol/L — CL (ref 135–145)
Total Bilirubin: 0.8 mg/dL (ref 0.3–1.2)
Total Protein: 6.5 g/dL (ref 6.5–8.1)

## 2021-07-06 LAB — SODIUM
Sodium: 116 mmol/L — CL (ref 135–145)
Sodium: 117 mmol/L — CL (ref 135–145)
Sodium: 121 mmol/L — ABNORMAL LOW (ref 135–145)

## 2021-07-06 MED ORDER — SODIUM CHLORIDE 3 % IV SOLN
INTRAVENOUS | Status: DC
Start: 2021-07-06 — End: 2021-07-08
  Filled 2021-07-06 (×7): qty 500

## 2021-07-06 MED ORDER — ACETAMINOPHEN 325 MG PO TABS
650.0000 mg | ORAL_TABLET | ORAL | Status: DC | PRN
  Administered 2021-07-06 – 2021-08-22 (×133): 650 mg via ORAL
  Filled 2021-07-06 (×136): qty 2

## 2021-07-06 MED ORDER — LABETALOL HCL 5 MG/ML IV SOLN
10.0000 mg | INTRAVENOUS | Status: DC | PRN
  Administered 2021-07-06 – 2021-07-13 (×11): 10 mg via INTRAVENOUS
  Filled 2021-07-06 (×9): qty 4

## 2021-07-06 MED ORDER — SALINE SPRAY 0.65 % NA SOLN
1.0000 | NASAL | Status: DC | PRN
  Administered 2021-07-06 – 2021-07-25 (×6): 1 via NASAL
  Filled 2021-07-06: qty 44

## 2021-07-06 MED ORDER — HYDRALAZINE HCL 25 MG PO TABS
25.0000 mg | ORAL_TABLET | ORAL | Status: DC | PRN
  Administered 2021-07-08 – 2021-07-13 (×6): 25 mg via ORAL
  Filled 2021-07-06 (×6): qty 1

## 2021-07-06 MED ORDER — OXYCODONE HCL 5 MG PO TABS
5.0000 mg | ORAL_TABLET | Freq: Three times a day (TID) | ORAL | Status: DC | PRN
  Administered 2021-07-06 – 2021-07-19 (×34): 5 mg via ORAL
  Filled 2021-07-06 (×34): qty 1

## 2021-07-06 NOTE — Assessment & Plan Note (Addendum)
Continue PPI,carafete, simethicone, probiotics.

## 2021-07-06 NOTE — Assessment & Plan Note (Addendum)
BP well controlled, on lisinopril 30 mg and also getting Inderal for tremors

## 2021-07-06 NOTE — Progress Notes (Signed)
Progress Note    [name]   KCM:[id number]  DOB: Mar 04, 1956  DOA: 07/01/2021     4 PCP: Sandi Mariscal, MD  Initial CC: intentional overdose  Hospital Course: [name] is a 66 year old male with history of EtOH abuse, ankylosing spondylitis, anxiety, depression, history of colitis and chronic diarrhea, chronic headaches, chronic pain, HTN who presented to the hospital after intentional overdose of approximately 60 tablets of duloxetine.  There were multiple ER visits since September 2022 for anxiety, suicidal ideation, pain, and other issues.  He had been concerned with becoming homeless and endorsed wanting to "die because of this".  He was admitted to the hospital for further work-up as well as poison control evaluation and psychiatry assessment. He was incidentally found to be positive for COVID-19 as well.  Interval History:  No events overnight.  Sitter and nurse bedside this morning getting patient cleaned up.  Patient endorsed having headache and ongoing tremulousness.  Reviewed ongoing plan of care including ongoing monitoring of sodium levels.  Assessment and Plan: * Antidepressant overdose- (present on admission) - s/p evaluation by poison control on admission  Hyponatremia- (present on admission) - severe hypoNa with hx of SIADH. Now s/p ~60 tablets duloxetine with ensuing hypoNA and decreased intake.  - etiology is suspected due to SIADH from duloxetine OD - nephrology also following, appreciate assistance - plan is for continuing 3% NaCl at this time and trending Na. Continue fluid restriction as well - Na down to 116 this am but had been off 3% saline   Suicide attempt  [facility]) - attempt with duloxetine prior to admission - psychiatry following as well; recommendation is for inpatient psychiatric hospitalization - Continue IVC and one-to-one safety sitter -Continue scheduled ativan per psychiatry  COVID-19 virus infection - Incidental finding on admission.   Asymptomatic with unremarkable CXR - Remains on room air - Treated with molnupiravir   Alcohol abuse- (present on admission) - continue CIWA - also on scheduled ativan   Gastroesophageal reflux disease- (present on admission) - continue protonix   Essential hypertension- (present on admission) - continue lisinopril; will adjust as needed - can also use labetalol or hydralazine PRN     Old records reviewed in assessment of this patient  Antimicrobials: Molnupiravir 1/27 >> 2/1  DVT prophylaxis: Lovenox  Code Status:   Code Status: Full Code  Disposition Plan:  Inpt psych placement Status is: Inpt  Objective: Blood pressure (!) 158/80, pulse 80, temperature 98.7 F (37.1 C), temperature source Oral, resp. rate 18, height 5' 10"  (1.778 m), weight 63.9 kg, SpO2 96 %.  Examination:  Physical Exam Constitutional:      General: He is not in acute distress.    Appearance: Normal appearance.  HENT:     Head: Normocephalic and atraumatic.     Mouth/Throat:     Mouth: Mucous membranes are moist.  Eyes:     Extraocular Movements: Extraocular movements intact.  Cardiovascular:     Rate and Rhythm: Normal rate and regular rhythm.     Heart sounds: Normal heart sounds.  Pulmonary:     Effort: Pulmonary effort is normal. No respiratory distress.     Breath sounds: Normal breath sounds. No wheezing.  Abdominal:     General: Bowel sounds are normal. There is no distension.     Palpations: Abdomen is soft.     Tenderness: There is no abdominal tenderness.  Musculoskeletal:        General: Normal range of motion.  Cervical back: Normal range of motion and neck supple.  Skin:    General: Skin is warm and dry.  Neurological:     General: No focal deficit present.     Mental Status: He is alert.  Psychiatric:        Mood and Affect: Mood normal.        Behavior: Behavior normal.     Consultants:  Nephrology Psychiatry  Procedures:    Data Reviewed: I have  Reviewed nursing notes, Vitals, and Lab results since pt's last encounter. Pertinent lab results Na 116    LOS: 4 days   Dwyane Dee, MD [facility] 07/06/2021, 4:13 PM

## 2021-07-06 NOTE — Assessment & Plan Note (Addendum)
Noted.  Has been here over a month no risk of withdrawal

## 2021-07-06 NOTE — Consult Note (Signed)
[facility] Psychiatry Consult   Reason for Consult:  Suicide attempt by overdose on cymbalta Referring Physician:  Dr. Olevia Bowens Patient Identification: Anthony Lamb MRN:  [id number] Principal Diagnosis: Antidepressant overdose Diagnosis:  Principal Problem:   Antidepressant overdose Active Problems:   Essential hypertension   Hyponatremia   Gastroesophageal reflux disease   Suicidal ideations   Overdose   Total Time spent with patient: 20 minutes  Subjective:   Anthony Lamb is a 66 y.o. male patient admitted with suicide attempt by overdose.  Patient presented after overdosing on (60) 60 mg tablets of Cymbalta.  Patient is seen and assessed by this nurse practitioner.  Patient is observed lying in bed, watching television.  He recently completed physical therapy session this morning, and states he was tired.  When asking patient to describe his mood he states " pretty bad I feel pretty bad.  He says I am anxious and worried about losing my apartment."  At which point he begins to start shaking and becoming very tremulous, prior to entering the room patient was not visibly tremulous.  Vitamax best attempt to ease patient fears and anxiety, although he remains anxious about losing his apartment and going to a skilled nursing facility.  Patient again refutes the idea of going to an inpatient psychiatric hospital, and continues to decline the idea.  Patient continues to lack poor insight and judgment, as well as minimize his overall suicide attempt of high lethality.  He does endorse passive suicidal ideations, although he is able to contract for safety at this time.  Will continue one-to-one Air cabin crew.  Will recommend inpatient psychiatric admission once patient is medically stable.   HPI:  Anthony Lamb is a 66 y.o. male with a past medical history significant for hypertension, GERD, peptic ulcer disease, ankylosing spondylitis, irritable bowel syndrome, chronic pain,  anxiety, and depression who presents for suicide attempt by overdose.  According to EMS and patient, at 545 this morning, patient tried to overdose by taking 60 duloxetine tablets.  EMS thought that they were 60 mg tablets by report.  Patient reports she is feeling some waxing waning nausea, lightheadedness, and feels like he has no bowel movement.  He feels very dehydrated and tired.  He also is feeling very anxious and agitated.  He still says he wants to kill himself.  He reports that he was discharged from the emergency department yesterday at [facility] and he still says he is going to try to kill himself he leaves again today.  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 Nancy Fetter at [facility].  Past Medical History:  Past Medical History:  Diagnosis Date   Alcohol abuse, in remission    Anal fissure    Anemia    Ankylosing spondylitis (HCC)    Anxiety    Arthritis    Chronic diarrhea    Chronic headaches    Chronic pain    Colitis    Colon polyps    Depression    Esophagitis    Gastric polyps    hyperplastic and fundic gland   Gastritis    GERD (gastroesophageal reflux disease)    Hypertension    IBS (irritable bowel syndrome)    Poor dentition    SIADH (syndrome  of inappropriate ADH production) [facility])     Past Surgical History:  Procedure Laterality Date   BIOPSY  11/26/2018   Procedure: BIOPSY;  Surgeon: Gatha Mayer, MD;  Location: [facility];  Service: Endoscopy;;   COLONOSCOPY W/ BIOPSIES     COLONOSCOPY WITH PROPOFOL N/A 11/26/2018   Procedure: COLONOSCOPY WITH PROPOFOL;  Surgeon: Gatha Mayer, MD;  Location: [facility];  Service: Endoscopy;  Laterality: N/A;    ESOPHAGOGASTRODUODENOSCOPY     ESOPHAGOGASTRODUODENOSCOPY (EGD) WITH PROPOFOL N/A 11/26/2018   Procedure: ESOPHAGOGASTRODUODENOSCOPY (EGD) WITH PROPOFOL;  Surgeon: Gatha Mayer, MD;  Location: [facility];  Service: Endoscopy;  Laterality: N/A;   HEMOSTASIS CLIP PLACEMENT  11/26/2018   Procedure: HEMOSTASIS CLIP PLACEMENT;  Surgeon: Gatha Mayer, MD;  Location: [facility];  Service: Endoscopy;;   HERNIA REPAIR Bilateral    HOT HEMOSTASIS N/A 11/26/2018   Procedure: HOT HEMOSTASIS (ARGON PLASMA COAGULATION/BICAP);  Surgeon: Gatha Mayer, MD;  Location: [facility];  Service: Endoscopy;  Laterality: N/A;   OPEN REDUCTION INTERNAL FIXATION (ORIF) DISTAL RADIAL FRACTURE Right 02/11/2018   Procedure: OPEN REDUCTION INTERNAL FIXATION (ORIF) DISTAL RADIAL FRACTURE;  Surgeon: Milly Jakob, MD;  Location: [facility];  Service: Orthopedics;  Laterality: Right;   POLYPECTOMY  11/26/2018   Procedure: POLYPECTOMY;  Surgeon: Gatha Mayer, MD;  Location: [facility];  Service: Endoscopy;;   TONSILLECTOMY     Family History:  Family History  Problem Relation Age of Onset   Anxiety disorder Mother    Congestive Heart Failure Mother    Crohn's disease Mother    Colon cancer Mother 62   Arthritis Father    High blood pressure Father    Crohn's disease Father    Family Psychiatric  History: Denies Social History:  Social History   Substance and Sexual Activity  Alcohol Use Not Currently     Social History   Substance and Sexual Activity  Drug Use Not Currently   Types: Marijuana    Social History   Socioeconomic History   Marital status: Single    Spouse name: Not on file   Number of children: 0   Years of education: Not on file   Highest education level: Not on file  Occupational History   Not on file  Tobacco Use   Smoking status: Former    Types: Cigarettes    Quit date: 2014    Years since quitting: 9.0   Smokeless tobacco: Never  Vaping Use   Vaping Use: Never used   Substance and Sexual Activity   Alcohol use: Not Currently   Drug use: Not Currently    Types: Marijuana   Sexual activity: Not Currently  Other Topics Concern   Not on file  Social History Narrative   HSG. Long - term disability - unable to work. Lived with his mother in her house - she died 05/16/2023 -    Lives in apartment   Has case worker   Social Determinants of Radio broadcast assistant Strain: Not on file  Food Insecurity: Not on file  Transportation Needs: Not on file  Physical Activity: Not on file  Stress: Not on file  Social Connections: Not on file   Additional Social History:    Allergies:   Allergies  Allergen Reactions   Nsaids Other (See Comments)    GI upset- history of peptic ulcers!!   Diphenhydramine Hcl Other (See Comments)    Restlessness   Flexeril [Cyclobenzaprine] Other (See Comments)  Restlessness   Fluoxetine Other (See Comments)    Made the patient feel "worse than before" (treatment)   Hctz [Hydrochlorothiazide] Other (See Comments)    Caused to lose sodium when taking with Lisinopril   Rexulti [Brexpiprazole] Other (See Comments)    "Made me not feel right- restless"   Seroquel [Quetiapine] Other (See Comments)    Restless legs and makes the patient sweat- also does not help patient's insomnia. Pt takes this at home in 2023.   Gabapentin Diarrhea   Hydroxyzine Other (See Comments)    Restlessness    Labs:  Results for orders placed or performed during the hospital encounter of 07/01/21 (from the past 48 hour(s))  Basic metabolic panel     Status: Abnormal   Collection Time: 07/04/21  4:14 PM  Result Value Ref Range   Sodium 120 (L) 135 - 145 mmol/L   Potassium 3.5 3.5 - 5.1 mmol/L   Chloride 85 (L) 98 - 111 mmol/L   CO2 27 22 - 32 mmol/L   Glucose, Bld 106 (H) 70 - 99 mg/dL    Comment: Glucose reference range applies only to samples taken after fasting for at least 8 hours.   BUN 8 8 - 23 mg/dL   Creatinine, Ser 0.50 (L)  0.61 - 1.24 mg/dL   Calcium 8.4 (L) 8.9 - 10.3 mg/dL   GFR, Estimated >60 >60 mL/min    Comment: (NOTE) Calculated using the CKD-EPI Creatinine Equation (2021)    Anion gap 8 5 - 15    Comment: Performed at [facility], [place] [address] [place], [place] [address]  Sodium     Status: Abnormal   Collection Time: 07/04/21 11:58 PM  Result Value Ref Range   Sodium 118 (LL) 135 - 145 mmol/L    Comment: CRITICAL RESULT CALLED TO, READ BACK BY AND VERIFIED WITH:  Ernestene Mention RN 07/05/21 @ 1962 VS Performed at [facility], [place] [address] [place], [place] [address]   Comprehensive metabolic panel     Status: Abnormal   Collection Time: 07/05/21  3:53 AM  Result Value Ref Range   Sodium 118 (LL) 135 - 145 mmol/L    Comment: CRITICAL RESULT CALLED TO, READ BACK BY AND VERIFIED WITH:  COURTNEY GRIFFIN RN 07/05/21 @ 0441 VS    Potassium 3.3 (L) 3.5 - 5.1 mmol/L   Chloride 82 (L) 98 - 111 mmol/L   CO2 26 22 - 32 mmol/L   Glucose, Bld 98 70 - 99 mg/dL    Comment: Glucose reference range applies only to samples taken after fasting for at least 8 hours.   BUN 7 (L) 8 - 23 mg/dL   Creatinine, Ser 0.51 (L) 0.61 - 1.24 mg/dL   Calcium 8.7 (L) 8.9 - 10.3 mg/dL   Total Protein 6.4 (L) 6.5 - 8.1 g/dL   Albumin 3.6 3.5 - 5.0 g/dL   AST 21 15 - 41 U/L   ALT 16 0 - 44 U/L   Alkaline Phosphatase 56 38 - 126 U/L   Total Bilirubin 0.5 0.3 - 1.2 mg/dL   GFR, Estimated >60 >60 mL/min    Comment: (NOTE) Calculated using the CKD-EPI Creatinine Equation (2021)    Anion gap 10 5 - 15    Comment: Performed at [facility], [place] [address] [place], [place] [address]  Sodium     Status: Abnormal   Collection Time: 07/05/21  7:56 AM  Result Value Ref Range   Sodium  118 (LL) 135 - 145 mmol/L    Comment: CRITICAL RESULT CALLED TO, READ BACK BY AND VERIFIED WITHShelia Media RN AT [phone] 07/05/21 MULLINS,T  Performed at [facility],  [place] [address], [place], [place] [address]   Sodium     Status: Abnormal   Collection Time: 07/05/21 12:11 PM  Result Value Ref Range   Sodium 119 (LL) 135 - 145 mmol/L    Comment: CRITICAL RESULT CALLED TO, READ BACK BY AND VERIFIED WITHShelia Media RN AT [phone] 07/05/21 MULLINS,T Performed at [facility], [place] [address], [place], [place] [address]   Comprehensive metabolic panel     Status: Abnormal   Collection Time: 07/06/21  2:49 AM  Result Value Ref Range   Sodium 117 (LL) 135 - 145 mmol/L    Comment: CRITICAL RESULT CALLED TO, READ BACK BY AND VERIFIED WITH:  MANDY WATSON RN 07/06/21 @ 0325 VS    Potassium 3.8 3.5 - 5.1 mmol/L   Chloride 80 (L) 98 - 111 mmol/L   CO2 28 22 - 32 mmol/L   Glucose, Bld 96 70 - 99 mg/dL    Comment: Glucose reference range applies only to samples taken after fasting for at least 8 hours.   BUN 8 8 - 23 mg/dL   Creatinine, Ser 0.43 (L) 0.61 - 1.24 mg/dL   Calcium 8.6 (L) 8.9 - 10.3 mg/dL   Total Protein 6.5 6.5 - 8.1 g/dL   Albumin 3.7 3.5 - 5.0 g/dL   AST 20 15 - 41 U/L   ALT 15 0 - 44 U/L   Alkaline Phosphatase 59 38 - 126 U/L   Total Bilirubin 0.8 0.3 - 1.2 mg/dL   GFR, Estimated >60 >60 mL/min    Comment: (NOTE) Calculated using the CKD-EPI Creatinine Equation (2021)    Anion gap 9 5 - 15    Comment: Performed at [facility], [place] [address], [place], [place] [address]  CBC     Status: Abnormal   Collection Time: 07/06/21  2:49 AM  Result Value Ref Range   WBC 6.9 4.0 - 10.5 K/uL   RBC 3.96 (L) 4.22 - 5.81 MIL/uL   Hemoglobin 12.6 (L) 13.0 - 17.0 g/dL   HCT 34.5 (L) 39.0 - 52.0 %   MCV 87.1 80.0 - 100.0 fL   MCH 31.8 26.0 - 34.0 pg   MCHC 36.5 (H) 30.0 - 36.0 g/dL   RDW 11.7 11.5 - 15.5 %   Platelets 278 150 - 400 K/uL   nRBC 0.0 0.0 - 0.2 %    Comment: Performed at [facility], [place] [address], [place], [place]  [address]  Sodium     Status: Abnormal   Collection Time:  07/06/21  9:35 AM  Result Value Ref Range   Sodium 116 (LL) 135 - 145 mmol/L    Comment: CRITICAL RESULT CALLED TO, READ BACK BY AND VERIFIED WITHShelia Media RN AT [phone] 07/06/21 MULLINS,T Performed at [facility], [place] [address], [place], [place] [address]     Current Facility-Administered Medications  Medication Dose Route Frequency Provider Last Rate Last Admin   acetaminophen (TYLENOL) tablet 650 mg  650 mg Oral Q4H PRN Dwyane Dee, MD   650 mg at 07/06/21 2952   Chlorhexidine Gluconate Cloth 2 % PADS 6 each  6 each Topical Daily Caren Griffins, MD   6 each at 07/06/21 0829   enoxaparin (LOVENOX) injection 40 mg  40 mg Subcutaneous Q24H  Reubin Milan, MD   40 mg at 07/05/21 2243   lisinopril (ZESTRIL) tablet 10 mg  10 mg Oral QHS Donne Hazel, MD   10 mg at 07/05/21 2244   LORazepam (ATIVAN) tablet 1 mg  1 mg Oral Q8H Reubin Milan, MD   1 mg at 07/06/21 0617   MEDLINE mouth rinse  15 mL Mouth Rinse BID Donne Hazel, MD   15 mL at 07/06/21 3710   molnupiravir EUA (LAGEVRIO) capsule 800 mg  4 capsule Oral BID Tu, Ching T, DO   800 mg at 07/06/21 0830   oxyCODONE (Oxy IR/ROXICODONE) immediate release tablet 5 mg  5 mg Oral Q8H PRN Dwyane Dee, MD       pantoprazole (PROTONIX) EC tablet 40 mg  40 mg Oral QAC breakfast Reubin Milan, MD   40 mg at 07/06/21 0828   phenol (CHLORASEPTIC) mouth spray 1 spray  1 spray Mouth/Throat PRN Donne Hazel, MD   1 spray at 07/04/21 1213   polyethylene glycol (MIRALAX / GLYCOLAX) packet 17 g  17 g Oral Daily PRN Donne Hazel, MD   17 g at 07/06/21 6269   prochlorperazine (COMPAZINE) injection 5 mg  5 mg Intravenous Q6H PRN Reubin Milan, MD   5 mg at 07/06/21 4854   sodium chloride (hypertonic) 3 % solution   Intravenous Continuous Elmarie Shiley, MD 40 mL/hr at 07/06/21 1043 New Bag at 07/06/21 1043   sucralfate (CARAFATE) tablet 1 g  1 g Oral QID Reubin Milan, MD   1 g at 07/06/21 6270    zolpidem (AMBIEN) tablet 5 mg  5 mg Oral QHS Reubin Milan, MD   5 mg at 07/05/21 2244    Musculoskeletal: Strength & Muscle Tone: within normal limits Gait & Station: normal Patient leans: N/A     Psychiatric Specialty Exam:  Presentation  General Appearance: Appropriate for Environment; Disheveled  Eye Contact:Fair  Speech:Clear and Coherent; Normal Rate  Speech Volume:Normal  Handedness:Right   Mood and Affect  Mood:Anxious  Affect:Appropriate; Congruent   Thought Process  Thought Processes:Coherent; Linear  Descriptions of Associations:Tangential  Orientation:Full (Time, Place and Person)  Thought Content:Logical  History of Schizophrenia/Schizoaffective disorder:No  Duration of Psychotic Symptoms:Greater than six months  Hallucinations:Hallucinations: None  Ideas of Reference:None  Suicidal Thoughts:Suicidal Thoughts: Yes, Passive SI Passive Intent and/or Plan: With Intent; With Plan  Homicidal Thoughts:Homicidal Thoughts: No   Sensorium  Memory:Immediate Fair; Recent Poor; Remote Fair  Judgment:Fair  Insight:Poor   Executive Functions  Concentration:Fair  Attention Span:Fair  [place]   Psychomotor Activity  Psychomotor Activity:Psychomotor Activity: Restlessness; Tremor; Increased   Assets  Assets:Communication Skills; Physical Health; Financial Resources/Insurance   Sleep  Sleep:Sleep: Poor   Physical Exam: Physical Exam Vitals and nursing note reviewed.  Constitutional:      Appearance: Normal appearance. He is normal weight.  HENT:     Head: Normocephalic.  Eyes:     General: Lids are normal.  Neurological:     General: No focal deficit present.     Mental Status: He is alert and oriented to person, place, and time.     Motor: Weakness and tremor present. No atrophy.  Psychiatric:        Attention and Perception: Attention and perception normal.        Mood  and Affect: Mood is anxious.        Speech: Speech normal.  Behavior: Behavior normal. Behavior is cooperative.        Thought Content: Thought content normal.        Cognition and Memory: Cognition and memory normal.        Judgment: Judgment normal.   Review of Systems  Neurological:  Positive for tremors, focal weakness and weakness.  Psychiatric/Behavioral:  Positive for depression, substance abuse (alcohol use, last drank 1 month ago) and suicidal ideas (suicide attempt). The patient is nervous/anxious and has insomnia (takes [place]).   All other systems reviewed and are negative. Blood pressure (!) 163/84, pulse 85, temperature 98.7 F (37.1 C), temperature source Oral, resp. rate 10, height 5' 10"  (1.778 m), weight 63.9 kg, SpO2 98 %. Body mass index is 20.21 kg/m.    Treatment Plan Summary: Daily contact with patient to assess and evaluate symptoms and progress in treatment, Medication management, and Plan     Recommended for inpatient psychiatric treatment. Patient unable to contract for safety.  -Patient will be placed on involuntary commitment at this time, as he is a danger to himself and unable to contract for safety. -Once medically stable will begin benzodiazepine taper to include Klonopin 1 mg p.o. twice daily.   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 Continue 1:1 Air cabin crew.    Disposition: Recommend psychiatric Inpatient admission when medically cleared. IVC  Suella Broad, FNP 07/06/2021 1:25 PM

## 2021-07-06 NOTE — Assessment & Plan Note (Deleted)
Prior to admission. Patient took Cymbalta. Psychiatry consulted and have recommended inpatient behavioral health admission. Medically stable for discharge.

## 2021-07-06 NOTE — Hospital Course (Addendum)
66 year old man presented after an intentional overdose of duloxetine and admitted on 1/273 for suicidal drug overdose incidental Covid, treate,severe hyponatremia w/ history of SIADH, seen by nephrology and treated with hypertonic saline, it was felt to be from significant inappropriate ADH activation in the setting of duloxetine overdose likely in a patient with underlying mild hyponatremia from EtOH use. Hosp course listed as- admitted 127. On 1/28 started on Molnupiravir for incidental COVID- 1/29 transferred to stepdown unit for 3% hypertonic saline for severe hyponatremia, 2/4 nephrology signed off and 07/12/21 Deemed medically stable to transfer to inpatient psychiatric facility, 07/30/21  PICC line removed.Patient has been medically stable for discharge pending for inpatient psych. With further follow-up by psychiatry they have deemed IVC can be rescinded 08/12/21 and referred patient for assisted living.

## 2021-07-06 NOTE — Assessment & Plan Note (Addendum)
Diagnosed on 07/01/21. Incidental. Treated with molnupiravir. Isolation discontinued.

## 2021-07-06 NOTE — Assessment & Plan Note (Addendum)
Due to duloxetine overdose, managed with hypertonic saline now sodium is stable and improved fluctuating 128-130.  BMP in 1 week.  Nephrology signed off. Recent Labs  Lab 08/09/21 0532 08/13/21 1047  NA 130* 128*

## 2021-07-06 NOTE — Progress Notes (Signed)
Patient ID: Anthony Lamb, male   DOB: 1955/11/08, 66 y.o.   MRN: [id number] [facility] Progress Note   Assessment/ Plan:   1.  Hyponatremia: Likely from significant inappropriate ADH activation in the setting of duloxetine overdose likely in a patient with underlying mild hyponatremia from EtOH use.  Continue current fluid restriction at 1.2 L daily and begin 3% saline with target sodium of 128 over the next 36 hours. 2.  COVID-19 infection: Appears an incidental finding without significant respiratory symptoms.  Ongoing management per primary service. 3.  Hypertension: Continue to follow blood pressures closely to decide on need to initiate antihypertensive therapy to replace lisinopril and verapamil that are currently on hold. 4.  Duloxetine overdose: Continue suicidal precautions and management per poison control.  Subjective:   Had an episode of neck pain yesterday-acute on chronic.  Reports to be feeling down and sad today after losing his apartment.  Generalized body aches/pains with tremors persist.   Objective:   BP (!) 142/77    Pulse 77    Temp 98.2 F (36.8 C) (Oral)    Resp 18    Ht 5' 10"  (1.778 m)    Wt 63.9 kg    SpO2 96%    BMI 20.21 kg/m   Intake/Output Summary (Last 24 hours) at 07/06/2021 1218 Last data filed at 07/06/2021 1041 Gross per 24 hour  Intake 340 ml  Output 625 ml  Net -285 ml   Weight change:   Physical Exam: Gen: Appears uncomfortable/flat affect resting in bed.  Sitter at bedside CVS: Pulse regular rhythm, normal rate, S1 and S2 normal Resp: Clear to auscultation bilaterally, no rales/rhonchi Abd: Soft, flat, nontender, bowel sounds normal Ext: No lower extremity edema, generalized tremor persists  Imaging: DG Cervical Spine 2 or 3 views  Result Date: 07/05/2021 CLINICAL DATA:  History of ankylosing spondylitis. EXAM: CERVICAL SPINE - 2-3 VIEW COMPARISON:  CT neck 01/13/2021, CT angio chest 02/14/2018 FINDINGS: Cervical spine is  visualized to the level of C7. Vertebral body heights are maintained. Alignment is anatomic. Generalized osteopenia. Prevertebral soft tissues are normal. No acute fracture. Disc spaces are maintained. Syndesmophytes of the cervical spine. Discontinuity of the anterior bridging osteophyte at C4-5 unchanged compared with 02/14/2018. 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 IMPRESSION: 1. Findings consistent with ankylosing spondylitis. No acute osseous injury of the cervical spine. If there is persistent concern regarding an acute cervical spine injury, recommend a CT or MRI of the cervical spine. Electronically Signed   By: Kathreen Devoid M.D.   On: 07/05/2021 16:38    Labs: [employer] Recent Labs  Lab 07/03/21 0341 07/03/21 0622 07/03/21 0741 07/03/21 1619 07/03/21 2211 07/04/21 0305 07/04/21 1614 07/04/21 2358 07/05/21 0353 07/05/21 0756 07/05/21 1211 07/06/21 0249 07/06/21 0935  NA 112* 109*   < > 112*   < > 120* 120* 118* 118* 118* 119* 117* 116*  K 3.6 3.6  --  3.3*  --  3.5 3.5  --  3.3*  --   --  3.8  --   CL 76* 76*  --  79*  --  88* 85*  --  82*  --   --  80*  --   CO2 25 20*  --  23  --  24 27  --  26  --   --  28  --   GLUCOSE 74 76  --  78  --  79 106*  --  98  --   --  96  --   BUN <5* <5*  --  5*  --  7* 8  --  7*  --   --  8  --   CREATININE 0.40* 0.43*  --  0.48*  --  0.57* 0.50*  --  0.51*  --   --  0.43*  --   CALCIUM 8.5* 8.6*  --  8.0*  --  8.2* 8.4*  --  8.7*  --   --  8.6*  --    < > = values in this interval not displayed.   CBC Recent Labs  Lab 07/01/21 0711 07/04/21 0305 07/06/21 0249  WBC 8.1 5.2 6.9  HGB 14.6 12.1* 12.6*  HCT 43.2 33.1* 34.5*  MCV 93.1 86.6 87.1  PLT 290 249 278    Medications:     Chlorhexidine Gluconate Cloth  6 each Topical Daily   enoxaparin (LOVENOX) injection  40 mg Subcutaneous Q24H   lisinopril  10 mg Oral QHS   LORazepam  1 mg Oral Q8H   mouth rinse  15 mL Mouth Rinse BID   molnupiravir EUA  4 capsule Oral  BID   pantoprazole  40 mg Oral QAC breakfast   sucralfate  1 g Oral QID   zolpidem  5 mg Oral QHS   Elmarie Shiley, MD 07/06/2021, 12:18 PM

## 2021-07-06 NOTE — Assessment & Plan Note (Addendum)
Suicidal attempt with overdose of antidepressant Major depression recurrent Sedative,hypnotic or anxiolytic induced anxiety disorder: Presented with intentional overdose of duloxetine-now is stable cleared by psych.On chronic benzo and multiple suicide attempts of high lethality in the past.Initially on IVC.Psych recommended to start patient on a benzodiazepine taper and are managing it-they have notified all parties involved in prescribing the med to pt.Cont with current Abilify 2 mg qhs,Klonopin 0.5 daily,1 mg qhs,Remeron 7.5 mg qhs and inderal 20 mg Tid-for tremors,on  Ambien PRN.Defer to psych regarding IVC rescind.Cont PT/OT-will need ALF,unable to do self care It seems.

## 2021-07-06 NOTE — Evaluation (Signed)
Occupational Therapy Evaluation Patient Details Name: Anthony Lamb MRN: [id number] DOB: Mar 19, 1956 Today's Date: 07/06/2021   History of Present Illness 66 year old male with history of EtOH abuse, ankylosing spondylitis, anxiety, depression, history of colitis and chronic diarrhea, chronic headaches, chronic pain, hypertension, comes into the hospital after taking 60 tablets of duloxetine.  He has had multiple ER visits since September for anxiety, suicidal ideation, pain and other issues   Clinical Impression   [name] is a 66 year old man admitted to hospital with above medical history. Patient was grossly independent prior to admission and living alone in an apartment. He now reports he lost his apartment. On evaluation he presents with decreased activity tolerance, impaired balance, tremors and mild myoclonus in arms and legs  Patient reporting significant dizziness and intermittent double vision. Evaluation limited to sitting edge of bed due to patient's reports of dizziness with sitting and eventually laying himself down. His ADLs are limited to bed level or edge of bed. Patient tends to belabor on the negative - his dizziness, vision, losing his apartment, inability to walk. Therapist had to remind him that this is his current state due to electrolyte imbalances and he should improve and should focus on improving his functional abilities. Patient will benefit from skilled OT services while in hospital to improve deficits and learn compensatory strategies as needed in order to return to to PLOF.  Currently patient is unable to care for himself and therapist would recommend short term rehab. Therapist aware that the recommendation is for patient to go to inpatient psych. Will update POC when patient demonstrates ability to perform ADLs.     Recommendations for follow up therapy are one component of a multi-disciplinary discharge planning process, led by the attending physician.   Recommendations may be updated based on patient status, additional functional criteria and insurance authorization.   Follow Up Recommendations  Skilled nursing-short term rehab (<3 hours/day)    Assistance Recommended at Discharge Frequent or constant Supervision/Assistance  Patient can return home with the following A lot of help with bathing/dressing/bathroom;A lot of help with walking and/or transfers;Direct supervision/assist for financial management    Functional Status Assessment  Patient has had a recent decline in their functional status and demonstrates the ability to make significant improvements in function in a reasonable and predictable amount of time.  Equipment Recommendations  Other (comment) (TBD)    Recommendations for Other Services       Precautions / Restrictions Precautions Precautions: Fall Precaution Comments: LE tremors, reports dizziness and double vision Restrictions Weight Bearing Restrictions: No      Mobility Bed Mobility                    Transfers                          Balance Overall balance assessment: Needs assistance Sitting-balance support: No upper extremity supported, Feet supported Sitting balance-Leahy Scale: Fair                                     ADL either performed or assessed with clinical judgement   ADL Overall ADL's : Needs assistance/impaired Eating/Feeding: Set up   Grooming: Set up   Upper Body Bathing: Set up;Sitting   Lower Body Bathing: Moderate assistance;Sitting/lateral leans   Upper Body Dressing : Minimal assistance;Sitting   Lower Body  Dressing: Maximal assistance;Sitting/lateral leans       Toileting- Clothing Manipulation and Hygiene: Total assistance;Bed level Toileting - Clothing Manipulation Details (indicate cue type and reason): external catheter     Functional mobility during ADLs: Supervision/safety General ADL Comments: Patient was supervision to  transfer to side of bed. patient reports dizziness with sitting up and did not want to attempt to stand. ADLs limited to seated position or bed. Tremors noted in hands once sitting up and mild tremors in lower extremities. Patient inevitably laid himseld down due to complaints of dizziness.     Vision Patient Visual Report: Diplopia (Reports intermittent diplopia.) Additional Comments: Grossly able to track but at times loses target with eyes. Noticed a jump in the right eye but could not get it to replicate. Reports dizziness as well so could be effecting visual assessment. Will continue to assess     Perception     Praxis      Pertinent Vitals/Pain Pain Assessment Pain Assessment: Faces Faces Pain Scale: Hurts a little bit Pain Location: neck and legs Pain Intervention(s): Monitored during session     Hand Dominance Right   Extremity/Trunk Assessment Upper Extremity Assessment Upper Extremity Assessment: RUE deficits/detail;LUE deficits/detail RUE Deficits / Details: WFL ROM, 4+/5 strength RUE Sensation: WNL RUE Coordination: WNL (tremors noted in seated position) LUE Deficits / Details: WFL ROM, 4+/5 strength LUE Sensation: WNL LUE Coordination: WNL (tremors noted in hands)   Lower Extremity Assessment Lower Extremity Assessment: Defer to PT evaluation   Cervical / Trunk Assessment Cervical / Trunk Assessment: Kyphotic   Communication Communication Communication: No difficulties   Cognition Arousal/Alertness: Awake/alert Behavior During Therapy: WFL for tasks assessed/performed Overall Cognitive Status: Within Functional Limits for tasks assessed                                 General Comments: Tends to be negative reporting constant impairments/pain/problems.     General Comments       Exercises     Shoulder Instructions      Home Living Family/patient expects to be discharged to:: Other (Comment) Living Arrangements: Alone                                Additional Comments: Reports he had an apartment but lost it.      Prior Functioning/Environment Prior Level of Function : Independent/Modified Independent             Mobility Comments: reports using a walker ADLs Comments: reports independence        OT Problem List: Decreased activity tolerance;Impaired balance (sitting and/or standing);Decreased knowledge of use of DME or AE;Pain      OT Treatment/Interventions: Self-care/ADL training;Therapeutic exercise;DME and/or AE instruction;Therapeutic activities;Balance training;Patient/family education    OT Goals(Current goals can be found in the care plan section) Acute Rehab OT Goals Patient Stated Goal: to take care of himself OT Goal Formulation: With patient Time For Goal Achievement: 07/20/21 Potential to Achieve Goals: Good  OT Frequency: Min 2X/week    Co-evaluation              AM-PAC OT "6 Clicks" Daily Activity     Outcome Measure Help from another person eating meals?: A Little Help from another person taking care of personal grooming?: A Little Help from another person toileting, which includes using toliet, bedpan, or urinal?: Total Help from  another person bathing (including washing, rinsing, drying)?: A Lot Help from another person to put on and taking off regular upper body clothing?: A Little Help from another person to put on and taking off regular lower body clothing?: A Lot 6 Click Score: 14   End of Session    Activity Tolerance: Other (comment) (dizziness) Patient left:    OT Visit Diagnosis: Unsteadiness on feet (R26.81);Dizziness and giddiness (R42)                Time: 0164-2903 OT Time Calculation (min): 14 min Charges:  OT General Charges $OT Visit: 1 Visit OT Evaluation $OT Eval Low Complexity: 1 Low  Sigrid Schwebach, OTR/L [place]  Office [phone] Pager: [place] 07/06/2021, 1:36 PM

## 2021-07-07 ENCOUNTER — Inpatient Hospital Stay: Payer: Self-pay

## 2021-07-07 DIAGNOSIS — T43202A Poisoning by unspecified antidepressants, intentional self-harm, initial encounter: Secondary | ICD-10-CM | POA: Diagnosis not present

## 2021-07-07 DIAGNOSIS — T1491XA Suicide attempt, initial encounter: Secondary | ICD-10-CM

## 2021-07-07 DIAGNOSIS — F418 Other specified anxiety disorders: Secondary | ICD-10-CM | POA: Diagnosis not present

## 2021-07-07 DIAGNOSIS — U071 COVID-19: Secondary | ICD-10-CM | POA: Diagnosis not present

## 2021-07-07 LAB — BASIC METABOLIC PANEL
Anion gap: 6 (ref 5–15)
BUN: 7 mg/dL — ABNORMAL LOW (ref 8–23)
CO2: 28 mmol/L (ref 22–32)
Calcium: 8.1 mg/dL — ABNORMAL LOW (ref 8.9–10.3)
Chloride: 86 mmol/L — ABNORMAL LOW (ref 98–111)
Creatinine, Ser: 0.52 mg/dL — ABNORMAL LOW (ref 0.61–1.24)
GFR, Estimated: >60 mL/min (ref >60)
Glucose, Bld: 102 mg/dL — ABNORMAL HIGH (ref 70–99)
Potassium: 3.5 mmol/L (ref 3.5–5.1)
Sodium: 120 mmol/L — ABNORMAL LOW (ref 135–145)

## 2021-07-07 LAB — MAGNESIUM: Magnesium: 1.5 mg/dL — ABNORMAL LOW (ref 1.7–2.4)

## 2021-07-07 LAB — SODIUM
Sodium: 121 mmol/L — ABNORMAL LOW (ref 135–145)
Sodium: 121 mmol/L — ABNORMAL LOW (ref 135–145)
Sodium: 122 mmol/L — ABNORMAL LOW (ref 135–145)
Sodium: 123 mmol/L — ABNORMAL LOW (ref 135–145)

## 2021-07-07 MED ORDER — SODIUM CHLORIDE 0.9% FLUSH: 10.0000 mL | INTRAVENOUS | Status: DC | PRN

## 2021-07-07 MED ORDER — HALOPERIDOL 2 MG PO TABS
2.0000 mg | ORAL_TABLET | Freq: Four times a day (QID) | ORAL | Status: DC | PRN
  Administered 2021-07-07 – 2021-07-18 (×19): 2 mg via ORAL
  Filled 2021-07-07: qty 1
  Filled 2021-07-07 (×6): qty 2
  Filled 2021-07-07: qty 1
  Filled 2021-07-07 (×4): qty 2
  Filled 2021-07-07: qty 1
  Filled 2021-07-07: qty 2
  Filled 2021-07-07 (×3): qty 1
  Filled 2021-07-07 (×5): qty 2

## 2021-07-07 MED ORDER — MAGNESIUM SULFATE 4 GM/100ML IV SOLN
4.0000 g | Freq: Once | INTRAVENOUS | Status: AC
  Administered 2021-07-07: 4 g via INTRAVENOUS
  Filled 2021-07-07: qty 100

## 2021-07-07 MED ORDER — HALOPERIDOL LACTATE 5 MG/ML IJ SOLN
2.0000 mg | Freq: Four times a day (QID) | INTRAMUSCULAR | Status: DC | PRN
  Administered 2021-07-07 – 2021-07-16 (×4): 2 mg via INTRAMUSCULAR
  Filled 2021-07-07 (×5): qty 1

## 2021-07-07 MED ORDER — SODIUM CHLORIDE 0.9% FLUSH
10.0000 mL | Freq: Two times a day (BID) | INTRAVENOUS | Status: DC
  Administered 2021-07-07 (×2): 10 mL
  Administered 2021-07-08: 20 mL
  Administered 2021-07-08: 10 mL
  Administered 2021-07-09: 20 mL
  Administered 2021-07-09: 10 mL
  Administered 2021-07-10: 20 mL
  Administered 2021-07-10 – 2021-07-11 (×2): 10 mL
  Administered 2021-07-11: 20 mL
  Administered 2021-07-12 – 2021-07-29 (×23): 10 mL

## 2021-07-07 NOTE — Progress Notes (Signed)
Rash on Right arm around IV site where 3%NS is infusing. Site flushes easily and large amount of blood return. Moved the 3% to another IV site. Attempted IV unsuccessful unable to advance the catheter. Contacted [facility] and order placed for IV start. VS WNL. Will continue to monitor site.

## 2021-07-07 NOTE — Progress Notes (Signed)
Peripherally Inserted Central Catheter Placement  The IV Nurse has discussed with the patient and/or persons authorized to consent for the patient, the purpose of this procedure and the potential benefits and risks involved with this procedure.  The benefits include less needle sticks, lab draws from the catheter, and the patient may be discharged home with the catheter. Risks include, but not limited to, infection, bleeding, blood clot (thrombus formation), and puncture of an artery; nerve damage and irregular heartbeat and possibility to perform a PICC exchange if needed/ordered by physician.  Alternatives to this procedure were also discussed.  Bard Power PICC patient education guide, fact sheet on infection prevention and patient information card has been provided to patient /or left at bedside.    PICC Placement Documentation  PICC Double Lumen 07/07/21 PICC Right Brachial 40 cm 0 cm (Active)  Indication for Insertion or Continuance of Line Vasoactive infusions 07/07/21 1047  Exposed Catheter (cm) 0 cm 07/07/21 1047  Site Assessment Clean;Dry;Intact 07/07/21 1047  Lumen #1 Status Flushed;Saline locked;Blood return noted 07/07/21 1047  Lumen #2 Status Flushed;Saline locked;Blood return noted 07/07/21 1047  Dressing Type Transparent;Securing device 07/07/21 1047  Dressing Status Clean;Dry;Intact 07/07/21 1047  Antimicrobial disc in place? Yes 07/07/21 1047  Safety Lock Not Applicable 72/89/79 1504  Line Adjustment (NICU/IV Team Only) No 07/07/21 1047  Dressing Intervention New dressing;Other (Comment) 07/07/21 1047  Dressing Change Due 07/14/21 07/07/21 1047    Consent signed by MD under medical necessity. Consent in chart.    Enos Fling 07/07/2021, 10:52 AM

## 2021-07-07 NOTE — Progress Notes (Signed)
Patient ID: Anthony Lamb, male   DOB: 08/09/1955, 66 y.o.   MRN: [id number] [facility] Progress Note   Assessment/ Plan:   1.  Hyponatremia: Likely from significant inappropriate ADH activation in the setting of duloxetine overdose likely in a patient with underlying mild hyponatremia from EtOH use.  Continue current fluid restriction at 1.2 L daily and will increase infusion rate of 3% saline from 40 cc/h to 50 cc/h as we aim for sodium of 128. 2.  COVID-19 infection: Appears an incidental finding without significant respiratory symptoms.  Treated with molnupiravir and remains relatively asymptomatic from a respiratory standpoint without need for supplemental oxygenation. 3.  Hypertension: Continue to follow blood pressures closely to decide on need to initiate antihypertensive therapy to replace lisinopril and verapamil that are currently on hold. 4.  Duloxetine overdose: Continue suicidal precautions and management per poison control.  Subjective:   Still feeling sad/disappointment from losing his apartment.  Having diffuse myalgias with ? psychosomatic tremor.   Objective:   BP (!) 176/66    Pulse 78    Temp 98.5 F (36.9 C) (Oral)    Resp (!) 26    Ht 5' 10"  (1.778 m)    Wt 63.9 kg    SpO2 96%    BMI 20.21 kg/m   Intake/Output Summary (Last 24 hours) at 07/07/2021 1105 Last data filed at 07/07/2021 1601 Gross per 24 hour  Intake 1769.24 ml  Output 776 ml  Net 993.24 ml   Weight change:   Physical Exam: Gen: Appears uncomfortable/flat affect resting in bed.  Sitter at bedside CVS: Pulse regular rhythm, normal rate, S1 and S2 normal Resp: Clear to auscultation bilaterally, no rales/rhonchi Abd: Soft, flat, nontender, bowel sounds normal Ext: No lower extremity edema, generalized tremor persists  Imaging: DG Cervical Spine 2 or 3 views  Result Date: 07/05/2021 CLINICAL DATA:  History of ankylosing spondylitis. EXAM: CERVICAL SPINE - 2-3 VIEW COMPARISON:  CT neck  01/13/2021, CT angio chest 02/14/2018 FINDINGS: Cervical spine is visualized to the level of C7. Vertebral body heights are maintained. Alignment is anatomic. Generalized osteopenia. Prevertebral soft tissues are normal. No acute fracture. Disc spaces are maintained. Syndesmophytes of the cervical spine. Discontinuity of the anterior bridging osteophyte at C4-5 unchanged compared with 02/14/2018. 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 IMPRESSION: 1. Findings consistent with ankylosing spondylitis. No acute osseous injury of the cervical spine. If there is persistent concern regarding an acute cervical spine injury, recommend a CT or MRI of the cervical spine. Electronically Signed   By: Kathreen Devoid M.D.   On: 07/05/2021 16:38   [place] EKG SITE RITE  Result Date: 07/07/2021 If [facility] image not attached, placement could not be confirmed due to current cardiac rhythm.   Labs: [facility] Recent Labs  Lab 07/03/21 0622 07/03/21 0741 07/03/21 1619 07/03/21 2211 07/04/21 0305 07/04/21 1614 07/04/21 2358 07/05/21 0353 07/05/21 0756 07/05/21 1211 07/06/21 0249 07/06/21 0935 07/06/21 1449 07/06/21 2055 07/07/21 0232 07/07/21 0623  NA 109*   < > 112*   < > 120* 120*   < > 118*   < > 119* 117* 116* 117* 121* 120* 121*  K 3.6  --  3.3*  --  3.5 3.5  --  3.3*  --   --  3.8  --   --   --  3.5  --   CL 76*  --  79*  --  88* 85*  --  82*  --   --  80*  --   --   --  86*  --   CO2 20*  --  23  --  24 27  --  26  --   --  28  --   --   --  28  --   GLUCOSE 76  --  78  --  79 106*  --  98  --   --  96  --   --   --  102*  --   BUN <5*  --  5*  --  7* 8  --  7*  --   --  8  --   --   --  7*  --   CREATININE 0.43*  --  0.48*  --  0.57* 0.50*  --  0.51*  --   --  0.43*  --   --   --  0.52*  --   CALCIUM 8.6*  --  8.0*  --  8.2* 8.4*  --  8.7*  --   --  8.6*  --   --   --  8.1*  --    < > = values in this interval not displayed.   CBC Recent Labs  Lab 07/01/21 0711 07/04/21 0305  07/06/21 0249  WBC 8.1 5.2 6.9  HGB 14.6 12.1* 12.6*  HCT 43.2 33.1* 34.5*  MCV 93.1 86.6 87.1  PLT 290 249 278    Medications:     Chlorhexidine Gluconate Cloth  6 each Topical Daily   enoxaparin (LOVENOX) injection  40 mg Subcutaneous Q24H   lisinopril  10 mg Oral QHS   LORazepam  1 mg Oral Q8H   mouth rinse  15 mL Mouth Rinse BID   pantoprazole  40 mg Oral QAC breakfast   sodium chloride flush  10-40 mL Intracatheter Q12H   sucralfate  1 g Oral QID   zolpidem  5 mg Oral QHS   Elmarie Shiley, MD 07/07/2021, 11:05 AM

## 2021-07-07 NOTE — Plan of Care (Signed)
°  Problem: Coping: Goal: Psychosocial and spiritual needs will be supported Outcome: Progressing   Problem: Respiratory: Goal: Will maintain a patent airway Outcome: Progressing Goal: Complications related to the disease process, condition or treatment will be avoided or minimized Outcome: Progressing

## 2021-07-07 NOTE — Assessment & Plan Note (Addendum)
See above continue meds as above.

## 2021-07-07 NOTE — Consult Note (Addendum)
°  Anthony Lamb is a 66 y.o. male patient admitted with suicide attempt by overdose.  Patient presented after overdosing on (60) 60 mg tablets of Cymbalta.  Patient is seen and attempted to re-assess by this nurse practitioner.  He is observed to be sleeping. Spoke with nursing staff and sitter, who reports he just went to sleep and has been restless and anxious most of the day. As per nursing staff patient remains paranoid that he is dying, despite being able to provide any indicators or reasons as to why he thinks he is dying. He continue to meet inpatient criteria at this time.    -Patient continues to meet inpatient criteria at this time.  Goal will be transfer to inpatient geropsych psychiatric facility once medically stable. Patient is on day (6)/10 COVID quarantine.  He is at high risk for developing delirium.  -Recommend initiating delirium precautions.  -Due to concerns for debility also recommend physical therapy daily.  -Continue with prn Haldol at this time, as he continues to have hyponatremia(121).  -Due to his attempt and ongoing suicidal ideations and paranoia he continues to meet criteria for involuntary commitment.   Psychiatry will continue to follow.

## 2021-07-07 NOTE — Progress Notes (Signed)
Progress Note   Patient: Anthony Lamb VVO:[id number] DOB: 03-13-1956 DOA: 07/01/2021     5 DOS: the patient was seen and examined on 07/07/2021   Brief hospital course: Anthony Lamb is a 66 year old male with history of EtOH abuse, ankylosing spondylitis, anxiety, depression, history of colitis and chronic diarrhea, chronic headaches, chronic pain, HTN who presented to the hospital after intentional overdose of approximately 60 tablets of duloxetine.  There were multiple ER visits since September 2022 for anxiety, suicidal ideation, pain, and other issues.  He had been concerned with becoming homeless and endorsed wanting to "die because of this".  He was admitted to the hospital for further work-up as well as poison control evaluation and psychiatry assessment. He was incidentally found to be positive for COVID-19 as well.  Assessment and Plan: * Antidepressant overdose- (present on admission) - s/p evaluation by poison control on admission  Hyponatremia- (present on admission) - severe hypoNa with hx of SIADH. Now s/p ~60 tablets duloxetine with ensuing hypoNA and decreased intake.  - etiology is suspected due to SIADH from duloxetine OD - nephrology also following, appreciate assistance - plan is for continuing 3% NaCl at this time and trending Na. Continue fluid restriction as well - Na improving some after restart of hypertonic saline - difficulty with IV access; will order for PICC Placement   Suicide attempt [facility]) - attempt with duloxetine prior to admission - psychiatry following as well; recommendation is for inpatient psychiatric hospitalization - Continue IVC and one-to-one safety sitter -Continue scheduled ativan per psychiatry  Depression with anxiety- (present on admission) - still complaining of ongoing anxiety despite scheduled ativan - adding PRN haldol for now until further evaluation with psych   COVID-19 virus infection - Incidental finding on admission.   Asymptomatic with unremarkable CXR - Remains on room air - Treated with molnupiravir   Alcohol abuse- (present on admission) - continue CIWA although not likely to be withdrawing anymore  - also on scheduled ativan   Gastroesophageal reflux disease- (present on admission) - continue protonix   Essential hypertension- (present on admission) - continue lisinopril; will adjust as needed - can also use labetalol or hydralazine PRN       Subjective: No events overnight. Still very anxious this morning; dose of haldol given with mild improvement. Tried to reassure patient. Otherwise he's doing okay.   Physical Exam: Vitals:   07/07/21 0900 07/07/21 1000 07/07/21 1100 07/07/21 1447  BP: (!) 176/66 (!) 154/73 (!) 153/78   Pulse: 78 92 70   Resp: (!) 26 14 10    Temp:    98.3 F (36.8 C)  TempSrc:    Oral  SpO2: 96% 96% 95%   Weight:      Height:      Physical Exam Constitutional:      General: He is not in acute distress.    Appearance: Normal appearance.  HENT:     Head: Normocephalic and atraumatic.     Mouth/Throat:     Mouth: Mucous membranes are moist.  Eyes:     Extraocular Movements: Extraocular movements intact.  Cardiovascular:     Rate and Rhythm: Normal rate and regular rhythm.     Heart sounds: Normal heart sounds.  Pulmonary:     Effort: Pulmonary effort is normal. No respiratory distress.     Breath sounds: Normal breath sounds. No wheezing.  Abdominal:     General: Bowel sounds are normal. There is no distension.     Palpations: Abdomen is  soft.     Tenderness: There is no abdominal tenderness.  Musculoskeletal:        General: Normal range of motion.     Cervical back: Normal range of motion and neck supple.  Skin:    General: Skin is warm and dry.  Neurological:     General: No focal deficit present.     Mental Status: He is alert.  Psychiatric:        Mood and Affect: Mood normal.        Behavior: Behavior normal.     Data Reviewed:  I  have Reviewed nursing notes, Vitals, and Lab results since pt's last encounter. Pertinent lab results Na 121 I have ordered test including BMP, CBC, Mg I have reviewed the last note from all staff,  I have discussed pt's care plan and test results with nursing staff.   Family Communication:   Disposition: Status is: Inpatient Remains inpatient appropriate because: ongoing hypoNa treatment    Planned Discharge Destination:  Psych facility      Author: Dwyane Dee, MD 07/07/2021 3:24 PM  For on call review [email or web address].

## 2021-07-08 DIAGNOSIS — I10 Essential (primary) hypertension: Secondary | ICD-10-CM | POA: Diagnosis not present

## 2021-07-08 DIAGNOSIS — U071 COVID-19: Secondary | ICD-10-CM | POA: Diagnosis not present

## 2021-07-08 DIAGNOSIS — F418 Other specified anxiety disorders: Secondary | ICD-10-CM | POA: Diagnosis not present

## 2021-07-08 DIAGNOSIS — T43202A Poisoning by unspecified antidepressants, intentional self-harm, initial encounter: Secondary | ICD-10-CM | POA: Diagnosis not present

## 2021-07-08 LAB — SODIUM
Sodium: 124 mmol/L — ABNORMAL LOW (ref 135–145)
Sodium: 145 mmol/L (ref 135–145)
Sodium: 129 mmol/L — ABNORMAL LOW (ref 135–145)
Sodium: 128 mmol/L — ABNORMAL LOW (ref 135–145)
Sodium: 129 mmol/L — ABNORMAL LOW (ref 135–145)
Sodium: 127 mmol/L — ABNORMAL LOW (ref 135–145)

## 2021-07-08 LAB — BASIC METABOLIC PANEL
Anion gap: 5 (ref 5–15)
BUN: <5 mg/dL — ABNORMAL LOW (ref 8–23)
CO2: 28 mmol/L (ref 22–32)
Calcium: 8 mg/dL — ABNORMAL LOW (ref 8.9–10.3)
Chloride: 93 mmol/L — ABNORMAL LOW (ref 98–111)
Creatinine, Ser: 0.43 mg/dL — ABNORMAL LOW (ref 0.61–1.24)
GFR, Estimated: >60 mL/min (ref >60)
Glucose, Bld: 99 mg/dL (ref 70–99)
Potassium: 3.1 mmol/L — ABNORMAL LOW (ref 3.5–5.1)
Sodium: 126 mmol/L — ABNORMAL LOW (ref 135–145)

## 2021-07-08 LAB — MAGNESIUM: Magnesium: 2.3 mg/dL (ref 1.7–2.4)

## 2021-07-08 MED ORDER — LISINOPRIL 10 MG PO TABS
20.0000 mg | ORAL_TABLET | Freq: Every day | ORAL | Status: DC
  Administered 2021-07-08 – 2021-07-11 (×4): 20 mg via ORAL
  Filled 2021-07-08 (×4): qty 2

## 2021-07-08 MED ORDER — FAMOTIDINE IN NACL 20-0.9 MG/50ML-% IV SOLN
20.0000 mg | Freq: Once | INTRAVENOUS | Status: AC
  Administered 2021-07-08: 20 mg via INTRAVENOUS
  Filled 2021-07-08: qty 50

## 2021-07-08 MED ORDER — HYDROXYZINE HCL 25 MG PO TABS
25.0000 mg | ORAL_TABLET | ORAL | Status: DC | PRN
  Administered 2021-07-09 – 2021-07-22 (×25): 25 mg via ORAL
  Filled 2021-07-08 (×27): qty 1

## 2021-07-08 MED ORDER — DIPHENHYDRAMINE HCL 50 MG/ML IJ SOLN
50.0000 mg | Freq: Once | INTRAMUSCULAR | Status: AC
  Administered 2021-07-08: 50 mg via INTRAVENOUS
  Filled 2021-07-08: qty 1

## 2021-07-08 MED ORDER — INFLUENZA VAC A&B SA ADJ QUAD 0.5 ML IM PRSY
0.5000 mL | PREFILLED_SYRINGE | INTRAMUSCULAR | Status: AC
Start: 2021-07-09 — End: 2021-07-09
  Administered 2021-07-09: 0.5 mL via INTRAMUSCULAR
  Filled 2021-07-08: qty 0.5

## 2021-07-08 MED ORDER — POTASSIUM CHLORIDE CRYS ER 20 MEQ PO TBCR
30.0000 meq | EXTENDED_RELEASE_TABLET | Freq: Two times a day (BID) | ORAL | Status: AC
  Administered 2021-07-08 (×2): 30 meq via ORAL
  Filled 2021-07-08 (×2): qty 1

## 2021-07-08 MED ORDER — METHYLPREDNISOLONE SODIUM SUCC 125 MG IJ SOLR
125.0000 mg | Freq: Once | INTRAMUSCULAR | Status: AC
  Administered 2021-07-08: 125 mg via INTRAVENOUS
  Filled 2021-07-08: qty 2

## 2021-07-08 NOTE — Progress Notes (Signed)
Patient ID: Anthony Lamb, male   DOB: 1955/08/21, 66 y.o.   MRN: [id number] [facility] Progress Note   Assessment/ Plan:   1.  Hyponatremia: Likely from significant inappropriate ADH activation in the setting of duloxetine overdose likely in a patient with underlying mild hyponatremia from EtOH use.  Sodium level appears to be improving on 3% saline drip however, lab earlier today shows an abrupt/spuriously rise for which I have asked the nurse to recheck a sodium level and hold 3% saline at this time. 2.  COVID-19 infection: Appears an incidental finding without significant respiratory symptoms.  Treated with molnupiravir and remains relatively asymptomatic from a respiratory standpoint without need for supplemental oxygenation. 3.  Hypertension: Continue to follow blood pressures closely to decide on need to initiate antihypertensive therapy to replace lisinopril and verapamil that are currently on hold. 4.  Duloxetine overdose: Continue suicidal precautions and management per poison control.  Subjective:   Continues to cope with the sadness of losing his apartment, tremor somewhat better   Objective:   BP (!) 159/70    Pulse 64    Temp 98.3 F (36.8 C) (Oral)    Resp 11    Ht 5' 10"  (1.778 m)    Wt 63.9 kg    SpO2 96%    BMI 20.21 kg/m   Intake/Output Summary (Last 24 hours) at 07/08/2021 1213 Last data filed at 07/08/2021 1034 Gross per 24 hour  Intake 2383.45 ml  Output 2100 ml  Net 283.45 ml   Weight change:   Physical Exam: Gen: Appears uncomfortable/flat affect resting in bed.  Sitter at bedside CVS: Pulse regular rhythm, normal rate, S1 and S2 normal Resp: Clear to auscultation bilaterally, no rales/rhonchi Abd: Soft, flat, nontender, bowel sounds normal Ext: No lower extremity edema, generalized tremor persists  Imaging: [place] EKG SITE RITE  Result Date: 07/07/2021 If Site Rite image not attached, placement could not be confirmed due to current cardiac  rhythm.   Labs: BMET Recent Labs  Lab 07/03/21 1619 07/03/21 2211 07/04/21 0305 07/04/21 1614 07/04/21 2358 07/05/21 0353 07/05/21 0756 07/06/21 0249 07/06/21 0935 07/07/21 0232 07/07/21 0623 07/07/21 1255 07/07/21 1705 07/07/21 2035 07/08/21 0100 07/08/21 0500 07/08/21 1024  NA 112*   < > 120* 120*   < > 118*   < > 117*   < > 120* 121* 121* 122* 123* 124* 126* 145  K 3.3*  --  3.5 3.5  --  3.3*  --  3.8  --  3.5  --   --   --   --   --  3.1*  --   CL 79*  --  88* 85*  --  82*  --  80*  --  86*  --   --   --   --   --  93*  --   CO2 23  --  24 27  --  26  --  28  --  28  --   --   --   --   --  28  --   GLUCOSE 78  --  79 106*  --  98  --  96  --  102*  --   --   --   --   --  99  --   BUN 5*  --  7* 8  --  7*  --  8  --  7*  --   --   --   --   --  <  5*  --   CREATININE 0.48*  --  0.57* 0.50*  --  0.51*  --  0.43*  --  0.52*  --   --   --   --   --  0.43*  --   CALCIUM 8.0*  --  8.2* 8.4*  --  8.7*  --  8.6*  --  8.1*  --   --   --   --   --  8.0*  --    < > = values in this interval not displayed.   CBC Recent Labs  Lab 07/04/21 0305 07/06/21 0249  WBC 5.2 6.9  HGB 12.1* 12.6*  HCT 33.1* 34.5*  MCV 86.6 87.1  PLT 249 278    Medications:     Chlorhexidine Gluconate Cloth  6 each Topical Daily   enoxaparin (LOVENOX) injection  40 mg Subcutaneous Q24H   lisinopril  20 mg Oral Daily   LORazepam  1 mg Oral Q8H   mouth rinse  15 mL Mouth Rinse BID   pantoprazole  40 mg Oral QAC breakfast   potassium chloride  30 mEq Oral BID   sodium chloride flush  10-40 mL Intracatheter Q12H   sucralfate  1 g Oral QID   zolpidem  5 mg Oral QHS   Elmarie Shiley, MD 07/08/2021, 12:13 PM

## 2021-07-08 NOTE — Progress Notes (Signed)
Progress Note   Patient: Anthony Lamb VEH:[id number] DOB: 1956-04-23 DOA: 07/01/2021     6 DOS: the patient was seen and examined on 07/08/2021   Brief hospital course: Mr. Agerton is a 66 year old male with history of EtOH abuse, ankylosing spondylitis, anxiety, depression, history of colitis and chronic diarrhea, chronic headaches, chronic pain, HTN who presented to the hospital after intentional overdose of approximately 60 tablets of duloxetine.  There were multiple ER visits since September 2022 for anxiety, suicidal ideation, pain, and other issues.  He had been concerned with becoming homeless and endorsed wanting to "die because of this".  He was admitted to the hospital for further work-up as well as poison control evaluation and psychiatry assessment. He was incidentally found to be positive for COVID-19 as well.  Assessment and Plan: * Antidepressant overdose- (present on admission) - s/p evaluation by poison control on admission  Hyponatremia- (present on admission) - severe hypoNa with hx of SIADH. Now s/p ~60 tablets duloxetine with ensuing hypoNA and decreased intake.  - etiology is suspected due to SIADH from duloxetine OD - nephrology also following, appreciate assistance -Responding well to 3% saline.  Nephrology managing, appreciate assistance.  Continue trending sodium - Na improving some after restart of hypertonic saline -PICC line placed on 07/07/2021  Suicide attempt [facility]) - attempt with duloxetine prior to admission - psychiatry following as well; recommendation is for inpatient psychiatric hospitalization - Continue IVC and one-to-one safety sitter; IVC renewed on 07/08/2021 -Continue scheduled ativan per psychiatry  Depression with anxiety- (present on admission) - still complaining of ongoing anxiety despite scheduled ativan - adding PRN haldol for now  COVID-19 virus infection - Incidental finding on admission.  Asymptomatic with unremarkable CXR -  Remains on room air - Treated with molnupiravir   Alcohol abuse- (present on admission) - continue CIWA although not likely to be withdrawing anymore  - also on scheduled ativan   Gastroesophageal reflux disease- (present on admission) - continue protonix   Essential hypertension- (present on admission) - continue lisinopril; will adjust as needed - can also use labetalol or hydralazine PRN       Subjective: No events overnight.  He continues to perseverate over losing his apartment.  Not entirely sure how accurate his worries are or what his social/home life is.  Regardless, tried reassuring him further say.  His shakes are overall improved.  He just continues to have a very anxious appearing demeanor.  Physical Exam: Vitals:   07/08/21 1100 07/08/21 1200 07/08/21 1203 07/08/21 1300  BP: (!) 149/67 (!) 149/72  113/67  Pulse: 78 77  85  Resp: 14 18  14   Temp:   98.3 F (36.8 C)   TempSrc:   Oral   SpO2: 97% 97%  97%  Weight:      Height:      Physical Exam Constitutional:      General: He is not in acute distress.    Appearance: Normal appearance.     Comments: Anxious appearing  HENT:     Head: Normocephalic and atraumatic.     Mouth/Throat:     Mouth: Mucous membranes are moist.  Eyes:     Extraocular Movements: Extraocular movements intact.  Cardiovascular:     Rate and Rhythm: Normal rate and regular rhythm.     Heart sounds: Normal heart sounds.  Pulmonary:     Effort: Pulmonary effort is normal. No respiratory distress.     Breath sounds: Normal breath sounds. No wheezing.  Abdominal:  General: Bowel sounds are normal. There is no distension.     Palpations: Abdomen is soft.     Tenderness: There is no abdominal tenderness.  Musculoskeletal:        General: Normal range of motion.     Cervical back: Normal range of motion and neck supple.  Skin:    General: Skin is warm and dry.  Neurological:     General: No focal deficit present.     Mental  Status: He is alert.  Psychiatric:        Mood and Affect: Mood normal.        Behavior: Behavior normal.     Data Reviewed:  I have Reviewed nursing notes, Vitals, and Lab results since pt's last encounter. Pertinent lab results Na 128 I have ordered test including Na level  I have reviewed the last note from staff over past 24 hours,  I have discussed pt's care plan and test results with nursing staff, CM.   Family Communication:   Disposition: Status is: Inpatient Remains inpatient appropriate because: ongoing hypoNa treatment    Planned Discharge Destination:  Psych facility  DVT ppx: Lovenox    Author: Dwyane Dee, MD 07/08/2021 2:37 PM  For on call review [email or web address].

## 2021-07-08 NOTE — Progress Notes (Signed)
Occupational Therapy Treatment  Patient initially stating he can't walk however with persistent encouragement participates with therapy. Patient able to sit up at edge of bed reports dizziness. With min A +2 patient able to power up to standing with initial posterior lean. With cues to correct posture patient able to ambulate in room ~49f with encouragement. Poor eccentric control with sitting, does not reach back for chair when cued. BP taken and remained stable post mobility despite patient reporting dizziness throughout. Provide max encouragement to stay up to chair for ~1hr to maximize out of bed activity tolerance.    07/08/21 1400  OT Visit Information  Last OT Received On 07/08/21  Assistance Needed +2  PT/OT/SLP Co-Evaluation/Treatment Yes  Reason for Co-Treatment For patient/therapist safety;To address functional/ADL transfers  PT goals addressed during session Mobility/safety with mobility  OT goals addressed during session ADL's and self-care  History of Present Illness 66year old male with history of EtOH abuse, ankylosing spondylitis, anxiety, depression, history of colitis and chronic diarrhea, chronic headaches, chronic pain, hypertension, comes into the hospital after taking 60 tablets of duloxetine.  He has had multiple ER visits since September for anxiety, suicidal ideation, pain and other issues  Precautions  Precautions Fall  Precaution Comments reports dizziness in all positions, not orthostatic with position changes as of 2/3  Restrictions  Weight Bearing Restrictions No  Pain Assessment  Pain Assessment Faces  Faces Pain Scale 0  Cognition  Arousal/Alertness Awake/alert  Behavior During Therapy WFL for tasks assessed/performed  Overall Cognitive Status Within Functional Limits for tasks assessed  General Comments Tends to be negative reporting constant impairments/pain/problems.  Upper Extremity Assessment  Upper Extremity Assessment Overall WFL for tasks  assessed  ADL  Overall ADL's  Needs assistance/impaired  Toilet Transfer Minimal assistance;+2 for physical assistance;+2 for safety/equipment;Cueing for safety;Cueing for sequencing;Ambulation;Rolling walker (2 wheels)  Toilet Transfer Details (indicate cue type and reason) Patient initially with posterior lean in standing needing cues to correct/weight shift. With encouragement patient able to ambulate in room ~226fbefore sitting into recliner. Cues to reach back for chair but poor carry over needing assist with eccentric control  Balance  Overall balance assessment Needs assistance  Sitting-balance support Feet supported;Bilateral upper extremity supported  Sitting balance-Leahy Scale Poor  Postural control Posterior lean  Standing balance support During functional activity;Reliant on assistive device for balance;Bilateral upper extremity supported  Standing balance-Leahy Scale Poor  General Comments  General comments (skin integrity, edema, etc.) VSS on room air  OT - End of Session  Equipment Utilized During Treatment Rolling walker (2 wheels)  Activity Tolerance Other (comment) (Patient self limiting)  Patient left in chair;with call bell/phone within reach;with nursing/sitter in room  Nurse Communication Mobility status  OT Assessment/Plan  OT Plan Discharge plan remains appropriate  OT Visit Diagnosis Unsteadiness on feet (R26.81);Dizziness and giddiness (R42)  OT Frequency (ACUTE ONLY) Min 2X/week  Follow Up Recommendations Skilled nursing-short term rehab (<3 hours/day)  Assistance recommended at discharge Frequent or constant Supervision/Assistance  Patient can return home with the following A lot of help with bathing/dressing/bathroom;A lot of help with walking and/or transfers;Direct supervision/assist for financial management  OT Equipment Other (comment) (TBD)  AM-PAC OT "6 Clicks" Daily Activity Outcome Measure (Version 2)  Help from another person eating meals? 3   Help from another person taking care of personal grooming? 3  Help from another person toileting, which includes using toliet, bedpan, or urinal? 2  Help from another person bathing (including washing, rinsing, drying)? 2  Help from another person to put on and taking off regular upper body clothing? 3  Help from another person to put on and taking off regular lower body clothing? 2  6 Click Score 15  Progressive Mobility  What is the highest level of mobility based on the progressive mobility assessment? Level 4 (Walks with assist in room) - Balance while marching in place and cannot step forward and back - Complete  Activity Ambulated with assistance in room;Transferred from bed to chair  OT Goal Progression  Progress towards OT goals Progressing toward goals  Acute Rehab OT Goals  Patient Stated Goal did not state  OT Goal Formulation With patient  Time For Goal Achievement 07/20/21  Potential to Achieve Goals Good  ADL Goals  Pt Will Perform Lower Body Dressing with supervision;sit to/from stand  Pt Will Transfer to Toilet with supervision;ambulating;regular height toilet;grab bars  Pt Will Perform Toileting - Clothing Manipulation and hygiene with supervision;sit to/from stand  Additional ADL Goal #1 Patient will stand at sink to perform grooming task as evidence of improving activity tolerance  Additional ADL Goal #2 Patient will perform 10 min functional activity or exercise activity as evidence of improving activity tolerance  OT Time Calculation  OT Start Time (ACUTE ONLY) 1138  OT Stop Time (ACUTE ONLY) 1157  OT Time Calculation (min) 19 min  OT General Charges  $OT Visit 1 Visit   Delbert Phenix OT OT pager: [phone]

## 2021-07-08 NOTE — TOC Initial Note (Addendum)
Transition of Care [facility]) - Initial/Assessment Note    Patient Details  Name: Anthony Lamb MRN: [id number] Date of Birth: 07/16/1955  Transition of Care [facility]) CM/SW Contact:    Lynnell Catalan, RN Phone Number: 07/08/2021, 12:03 PM  Clinical Narrative:                 IVC renewal paperwork completed and faxed to [facility] office. Awaiting response from [place] office and TOC  will alert [employer] to serve paperwork.  Addendum: Custody order received from Magistrate's office and Non-emergent EMS number called to have law enforcement come serve paperwork. Papers left in orange chart within patient's shadow chart.    Activities of Daily Living Home Assistive Devices/Equipment: None ADL Screening (condition at time of admission) Patient's cognitive ability adequate to safely complete daily activities?: Yes Is the patient deaf or have difficulty hearing?: No Does the patient have difficulty seeing, even when wearing glasses/contacts?: Yes Does the patient have difficulty concentrating, remembering, or making decisions?: Yes Patient able to express need for assistance with ADLs?: Yes Does the patient have difficulty dressing or bathing?: Yes Independently performs ADLs?: No Communication: Dependent Dressing (OT): Dependent Is this a change from baseline?: Pre-admission baseline Grooming: Needs assistance Feeding: Needs assistance Is this a change from baseline?: Pre-admission baseline Bathing: Needs assistance Toileting: Needs assistance In/Out Bed: Needs assistance Walks in Home: Independent Does the patient have difficulty walking or climbing stairs?: No Weakness of Legs: Both Weakness of Arms/Hands: Both  Admission diagnosis:  Suicide attempt ([place]) [T14.91XA] Antidepressant overdose [T43.201A] Intentional overdose, initial encounter ([place]) [T50.902A] Overdose [T50.901A] Patient Active Problem List   Diagnosis Date Noted   COVID-19 virus infection 07/06/2021    Overdose 07/02/2021   Antidepressant overdose 07/01/2021   Intentional overdose ([place])    Suicide attempt ([place]) 06/30/2021   Malingering 06/30/2021   MDD (major depressive disorder), recurrent episode, with atypical features ([place]) 05/07/2021   Suicidal ideation 04/05/2021   MDD (major depressive disorder), recurrent episode, severe ([place]) 03/31/2021   Substance induced mood disorder ([place]) 03/31/2021   Family history of colon cancer 12/02/2018   Gastric polyps    Schatzki's ring    Left sided ulcerative colitis without complication ([place])    Odynophagia 11/25/2018   Dysphagia 11/25/2018   Major depressive disorder, recurrent severe without psychotic features ([place]) 06/24/2018   IBS (irritable bowel syndrome)    Hypertension    Colitis    Adjustment disorder with mixed anxiety and depressed mood 05/11/2017   Alcohol abuse    Anxiety    Gastroesophageal reflux disease    Osteopenia determined by x-ray 08/06/2014   Stress fracture of calcaneus 08/06/2014   Vitamin D deficiency 12/18/2013   Dementia ([place]) 12/18/2013   Benign microscopic hematuria 07/06/2013   SIADH (syndrome of inappropriate ADH production) ([place]) 06/22/2013   Hyponatremia 06/20/2013   Ankylosing spondylitis ([place]) 06/20/2013   Malnutrition of moderate degree ([place]) 06/20/2013   Depression with anxiety 05/31/2013   BPH (benign prostatic hyperplasia) 08/22/2010   History of colonic polyps 08/13/2008   Essential hypertension 07/03/2007   PUD (peptic ulcer disease) 07/03/2007   PCP:  Sandi Mariscal, MD Pharmacy:   [facility] [place], [place] - [address] AT [address] [place] [address] Phone: [phone] Fax: [phone]  [place], [address] [place] [address] Phone: [phone] Fax: [phone]     Social Determinants of  Health (SDOH) Interventions    Readmission Risk  Interventions No flowsheet data found.

## 2021-07-08 NOTE — Progress Notes (Signed)
RN notified by NT that patient is having new itching with raised red rash on all four limbs, and trunk. Patient's vital signs stable, airway without compromise, lung sounds clear. Patient denies itching, swelling, or change of sensation in mouth/throat. MD notified of these changes. Medications ordered and given (see eMAR). RN remains bedside with NT and patient until handoff to next shift.

## 2021-07-08 NOTE — Progress Notes (Signed)
Physical Therapy Treatment Patient Details Name: Anthony Lamb MRN: [id number] DOB: [birth year] Today's Date: 07/08/2021   History of Present Illness 66 year old male with history of EtOH abuse, ankylosing spondylitis, anxiety, depression, history of colitis and chronic diarrhea, chronic headaches, chronic pain, hypertension, comes into the hospital after taking 60 tablets of duloxetine.  He has had multiple ER visits since September for anxiety, suicidal ideation, pain and other issues    PT Comments    Pt ambulated 24' with RW with min/guard assist, no loss of balance. Pt reported, "I can't walk" while ambulating. Pt also performed BUE/LE strengthening exercises. Improved activity tolerance today.    Recommendations for follow up therapy are one component of a multi-disciplinary discharge planning process, led by the attending physician.  Recommendations may be updated based on patient status, additional functional criteria and insurance authorization.  Follow Up Recommendations  Skilled nursing-short term rehab (<3 hours/day)     Assistance Recommended at Discharge Frequent or constant Supervision/Assistance  Patient can return home with the following Assistance with cooking/housework;Direct supervision/assist for medications management;Assist for transportation;Help with stairs or ramp for entrance;A little help with walking and/or transfers;A little help with bathing/dressing/bathroom   Equipment Recommendations  None recommended by PT    Recommendations for Other Services       Precautions / Restrictions Precautions Precautions: Fall Precaution Comments: reports dizziness in all positions Restrictions Weight Bearing Restrictions: No     Mobility  Bed Mobility Overal bed mobility: Modified Independent Bed Mobility: Supine to Sit     Supine to sit: Modified independent (Device/Increase time), HOB elevated     General bed mobility comments: HOB up, used rail     Transfers Overall transfer level: Needs assistance Equipment used: Rolling walker (2 wheels) Transfers: Sit to/from Stand Sit to Stand: +2 physical assistance, +2 safety/equipment, Max assist, Min assist           General transfer comment: mild posterior lean intially upon standing    Ambulation/Gait Ambulation/Gait assistance: Min guard, +2 safety/equipment Gait Distance (Feet): 24 Feet Assistive device: Rolling walker (2 wheels) Gait Pattern/deviations: Step-through pattern, Decreased stride length Gait velocity: decr     General Gait Details: steady with RW, no loss of balance, stated "I can't walk" while walking   Stairs             Wheelchair Mobility    Modified Rankin (Stroke Patients Only)       Balance Overall balance assessment: Needs assistance Sitting-balance support: Bilateral upper extremity supported, Feet supported Sitting balance-Leahy Scale: Poor   Postural control: Posterior lean Standing balance support: During functional activity, Reliant on assistive device for balance, Bilateral upper extremity supported Standing balance-Leahy Scale: Poor                              Cognition Arousal/Alertness: Awake/alert Behavior During Therapy: WFL for tasks assessed/performed Overall Cognitive Status: Within Functional Limits for tasks assessed                                 General Comments: Tends to be negative reporting constant impairments/pain/problems.        Exercises General Exercises - Upper Extremity Shoulder Flexion: AROM, Both, 10 reps, Seated General Exercises - Lower Extremity Ankle Circles/Pumps: AROM, Both, 10 reps, Seated Long Arc Quad: AROM, Both, 10 reps, Seated Hip Flexion/Marching: AROM, Both, 10 reps, Standing  General Comments        Pertinent Vitals/Pain Pain Assessment Pain Score: 0-No pain    Home Living                          Prior Function             PT Goals (current goals can now be found in the care plan section) Acute Rehab PT Goals PT Goal Formulation: Patient unable to participate in goal setting Time For Goal Achievement: 07/19/21 Potential to Achieve Goals: Fair Progress towards PT goals: Progressing toward goals    Frequency    Min 2X/week      PT Plan Current plan remains appropriate    Co-evaluation PT/OT/SLP Co-Evaluation/Treatment: Yes Reason for Co-Treatment: Necessary to address cognition/behavior during functional activity;For patient/therapist safety;To address functional/ADL transfers PT goals addressed during session: Mobility/safety with mobility;Balance;Proper use of DME;Strengthening/ROM        AM-PAC PT "6 Clicks" Mobility   Outcome Measure  Help needed turning from your back to your side while in a flat bed without using bedrails?: A Little Help needed moving from lying on your back to sitting on the side of a flat bed without using bedrails?: A Little Help needed moving to and from a bed to a chair (including a wheelchair)?: A Little Help needed standing up from a chair using your arms (e.g., wheelchair or bedside chair)?: A Little Help needed to walk in hospital room?: A Little Help needed climbing 3-5 steps with a railing? : A Lot 6 Click Score: 17    End of Session Equipment Utilized During Treatment: Gait belt Activity Tolerance: Patient tolerated treatment well Patient left: in chair;with call bell/phone within reach;with nursing/sitter in room (pt has full time sitter) Nurse Communication: Mobility status PT Visit Diagnosis: Unsteadiness on feet (R26.81);Other symptoms and signs involving the nervous system (R29.898)     Time: 7510-2585 PT Time Calculation (min) (ACUTE ONLY): 20 min  Charges:  $Gait Training: 8-22 mins                     Blondell Reveal Kistler PT 07/08/2021  Acute Rehabilitation Services Pager [phone] Office [phone]

## 2021-07-09 DIAGNOSIS — T7840XA Allergy, unspecified, initial encounter: Secondary | ICD-10-CM

## 2021-07-09 DIAGNOSIS — T43202A Poisoning by unspecified antidepressants, intentional self-harm, initial encounter: Secondary | ICD-10-CM | POA: Diagnosis not present

## 2021-07-09 DIAGNOSIS — U071 COVID-19: Secondary | ICD-10-CM | POA: Diagnosis not present

## 2021-07-09 DIAGNOSIS — F418 Other specified anxiety disorders: Secondary | ICD-10-CM | POA: Diagnosis not present

## 2021-07-09 LAB — BASIC METABOLIC PANEL
Anion gap: 8 (ref 5–15)
BUN: 5 mg/dL — ABNORMAL LOW (ref 8–23)
CO2: 26 mmol/L (ref 22–32)
Calcium: 8.9 mg/dL (ref 8.9–10.3)
Chloride: 93 mmol/L — ABNORMAL LOW (ref 98–111)
Creatinine, Ser: 0.51 mg/dL — ABNORMAL LOW (ref 0.61–1.24)
GFR, Estimated: >60 mL/min (ref >60)
Glucose, Bld: 124 mg/dL — ABNORMAL HIGH (ref 70–99)
Potassium: 3.7 mmol/L (ref 3.5–5.1)
Sodium: 127 mmol/L — ABNORMAL LOW (ref 135–145)

## 2021-07-09 LAB — SODIUM
Sodium: 128 mmol/L — ABNORMAL LOW (ref 135–145)
Sodium: 127 mmol/L — ABNORMAL LOW (ref 135–145)

## 2021-07-09 LAB — MAGNESIUM: Magnesium: 2.1 mg/dL (ref 1.7–2.4)

## 2021-07-09 MED ORDER — FAMOTIDINE 20 MG PO TABS
20.0000 mg | ORAL_TABLET | Freq: Every day | ORAL | Status: AC
  Administered 2021-07-09 – 2021-07-11 (×3): 20 mg via ORAL
  Filled 2021-07-09 (×3): qty 1

## 2021-07-09 MED ORDER — DIPHENHYDRAMINE HCL 50 MG/ML IJ SOLN
50.0000 mg | Freq: Once | INTRAMUSCULAR | Status: AC
  Administered 2021-07-09: 50 mg via INTRAVENOUS
  Filled 2021-07-09: qty 1

## 2021-07-09 MED ORDER — DIPHENHYDRAMINE-ZINC ACETATE 2-0.1 % EX CREA
TOPICAL_CREAM | Freq: Three times a day (TID) | CUTANEOUS | Status: DC | PRN
  Administered 2021-07-10 (×2): 1 via TOPICAL
  Filled 2021-07-09: qty 28

## 2021-07-09 MED ORDER — METHYLPREDNISOLONE SODIUM SUCC 40 MG IJ SOLR
40.0000 mg | Freq: Three times a day (TID) | INTRAMUSCULAR | Status: AC
  Administered 2021-07-09 – 2021-07-10 (×3): 40 mg via INTRAVENOUS
  Filled 2021-07-09 (×3): qty 1

## 2021-07-09 MED ORDER — SODIUM CHLORIDE 0.9 % IV SOLN: INTRAVENOUS | Status: DC

## 2021-07-09 NOTE — Plan of Care (Signed)
°  Problem: Respiratory: Goal: Will maintain a patent airway Outcome: Progressing   Problem: Nutrition: Goal: Adequate nutrition will be maintained Outcome: Not Progressing   Problem: Coping: Goal: Level of anxiety will decrease Outcome: Not Progressing

## 2021-07-09 NOTE — Assessment & Plan Note (Addendum)
unclear etiology. Occurred on 2/3. Resolved with Benadryl, Pepcid and Solu-medrol.

## 2021-07-09 NOTE — Progress Notes (Signed)
Progress Note   Patient: Anthony Lamb MMH:[id number] DOB: 12/14/1955 DOA: 07/01/2021     7 DOS: the patient was seen and examined on 07/09/2021   Brief hospital course: [name] is a 66 year old male with history of EtOH abuse, ankylosing spondylitis, anxiety, depression, history of colitis and chronic diarrhea, chronic headaches, chronic pain, HTN who presented to the hospital after intentional overdose of approximately 60 tablets of duloxetine.  There were multiple ER visits since September 2022 for anxiety, suicidal ideation, pain, and other issues.  He had been concerned with becoming homeless and endorsed wanting to "die because of this".  He was admitted to the hospital for further work-up as well as poison control evaluation and psychiatry assessment. He was incidentally found to be positive for COVID-19 as well.  Assessment and Plan: * Antidepressant overdose- (present on admission) - s/p evaluation by poison control on admission  Hyponatremia- (present on admission) - severe hypoNa with hx of SIADH. Now s/p ~60 tablets duloxetine with ensuing hypoNA and decreased intake.  - etiology is suspected due to SIADH from duloxetine OD - nephrology also following, appreciate assistance - Na stable off 3% currently; greatly appreciate nephrology assistance - tentative plan would be tolvaptan if acute drop again - continue daily Na level for now -PICC line placed on 07/07/2021; keep in place for now  Allergic reaction - no recent new meds other than haldol on 2/2 and rash appeared on evening of 2/3; rash is pruritic and widespread and does appear allergic in nature - for now will treat with benadryl, pepcid, solumedrol and benadryl cream. Allergy list reviewed, nothing noted to have caused a rash in the past either - continue bedding changes and routine bathing  Suicide attempt [facility]) - attempt with duloxetine prior to admission - psychiatry following as well; recommendation is for  inpatient psychiatric hospitalization - Continue IVC and one-to-one safety sitter; IVC renewed on 07/08/2021 -Continue scheduled ativan per psychiatry  Depression with anxiety- (present on admission) - still complaining of ongoing anxiety despite scheduled ativan - adding PRN haldol for now  COVID-19 virus infection - diagnosed 07/01/21 - Incidental finding on admission.  Asymptomatic with unremarkable CXR - Remains on room air - Treated with molnupiravir   Alcohol abuse- (present on admission) - continue CIWA although not likely to be withdrawing anymore  - also on scheduled ativan   Gastroesophageal reflux disease- (present on admission) - continue protonix   Essential hypertension- (present on admission) - continue lisinopril; will adjust as needed - can also use labetalol or hydralazine PRN       Subjective:  Yesterday evening he developed a diffuse body rash that was rather pruritic in nature.  We treated him with Benadryl, Solu-Medrol, Pepcid.  This morning he still has ongoing pruritus and continued rash.  Does not appear to be any obvious changes in medications, foods.  He has been undergoing routine bedding changes and bathing. He remains anxious as usual.  Physical Exam: Vitals:   07/09/21 1200 07/09/21 1300 07/09/21 1400 07/09/21 1600  BP: (!) 128/54 (!) 171/82 (!) 169/80   Pulse: 72 (!) 109 79   Resp: 16 (!) 27 17   Temp: 97.9 F (36.6 C)   98.9 F (37.2 C)  TempSrc: Oral   Axillary  SpO2: 94% 95% 96%   Weight:      Height:      Physical Exam Constitutional:      General: He is not in acute distress.    Appearance: Normal appearance.  Comments: Anxious appearing  HENT:     Head: Normocephalic and atraumatic.     Mouth/Throat:     Mouth: Mucous membranes are moist.  Eyes:     Extraocular Movements: Extraocular movements intact.  Cardiovascular:     Rate and Rhythm: Normal rate and regular rhythm.     Heart sounds: Normal heart sounds.  Pulmonary:      Effort: Pulmonary effort is normal. No respiratory distress.     Breath sounds: Normal breath sounds. No wheezing.  Abdominal:     General: Bowel sounds are normal. There is no distension.     Palpations: Abdomen is soft.     Tenderness: There is no abdominal tenderness.  Musculoskeletal:        General: Normal range of motion.     Cervical back: Normal range of motion and neck supple.  Skin:    Comments: Diffuse scattered pruritic lesions that are erythematous and easily blanching in nature  Neurological:     General: No focal deficit present.     Mental Status: He is alert.  Psychiatric:        Mood and Affect: Mood normal.        Behavior: Behavior normal.     Data Reviewed:  I have Reviewed nursing notes, Vitals, and Lab results since pt's last encounter. Pertinent lab results Na 127 I have ordered test including Na level  I have reviewed the last note from staff over past 24 hours,  I have discussed pt's care plan and test results with nursing staff, CM.   Family Communication:   Disposition: Status is: Inpatient Remains inpatient appropriate because: ongoing hypoNa treatment    Planned Discharge Destination:  Psych facility  DVT ppx: Lovenox    Author: Dwyane Dee, MD 07/09/2021 4:41 PM  For on call review [email or web address].

## 2021-07-09 NOTE — Progress Notes (Addendum)
Patient ID: Anthony Lamb, male   DOB: 1955-11-26, 66 y.o.   MRN: [id number] [facility] Progress Note   Assessment/ Plan:   1.  Hyponatremia-data indicates that this is acute on chronic: Likely from significant inappropriate ADH activation in the setting of duloxetine overdose likely in a patient with underlying mild hyponatremia from EtOH use.  Sodium level stable at 127-128 off of 3% saline (previous labs indicate baseline of around 130-134) and anticipate will continue to correct slowly.  If sodium level drops again, would try tolvaptan and discontinue fluid restriction. 2.  COVID-19 infection: Appears an incidental finding without significant respiratory symptoms.  Treated with molnupiravir and remains relatively asymptomatic from a respiratory standpoint without need for supplemental oxygenation. 3.  Hypertension: Continue to follow blood pressures closely to decide on need to initiate antihypertensive therapy to replace lisinopril and verapamil that are currently on hold. 4.  Duloxetine overdose: Continue suicidal precautions and management per poison control.  Nephrology service will sign off at this time and remain available for questions or concerns.  Continue to follow daily labs and call with questions if needed.  Subjective:   Concerned that he might be getting dehydrated with fluid restriction and only passing "a small amount of dark urine".   Objective:   BP (!) 179/85    Pulse 87    Temp 97.9 F (36.6 C) (Oral)    Resp 16    Ht 5' 10"  (1.778 m)    Wt 63.9 kg    SpO2 97%    BMI 20.21 kg/m   Intake/Output Summary (Last 24 hours) at 07/09/2021 1231 Last data filed at 07/09/2021 1214 Gross per 24 hour  Intake 440 ml  Output 775 ml  Net -335 ml   Weight change:   Physical Exam: Gen: Appears anxious, resting in bed with sitter at bedside CVS: Pulse regular rhythm, normal rate, S1 and S2 normal Resp: Clear to auscultation bilaterally, no rales/rhonchi Abd:  Soft, flat, nontender, bowel sounds normal Ext: No lower extremity edema, legs appear tremulous however stopped with distraction  Imaging: No results found.  Labs: BMET Recent Labs  Lab 07/04/21 0305 07/04/21 1614 07/04/21 2358 07/05/21 0353 07/05/21 0756 07/06/21 0249 07/06/21 0935 07/07/21 0232 07/07/21 0623 07/08/21 0500 07/08/21 1024 07/08/21 1145 07/08/21 1328 07/08/21 1735 07/08/21 2200 07/09/21 0144 07/09/21 0500 07/09/21 0547  NA 120* 120*   < > 118*   < > 117*   < > 120*   < > 126*   < > 129* 128* 129* 127* 128* 127* 127*  K 3.5 3.5  --  3.3*  --  3.8  --  3.5  --  3.1*  --   --   --   --   --   --  3.7  --   CL 88* 85*  --  82*  --  80*  --  86*  --  93*  --   --   --   --   --   --  93*  --   CO2 24 27  --  26  --  28  --  28  --  28  --   --   --   --   --   --  26  --   GLUCOSE 79 106*  --  98  --  96  --  102*  --  99  --   --   --   --   --   --  124*  --   BUN 7* 8  --  7*  --  8  --  7*  --  <5*  --   --   --   --   --   --  5*  --   CREATININE 0.57* 0.50*  --  0.51*  --  0.43*  --  0.52*  --  0.43*  --   --   --   --   --   --  0.51*  --   CALCIUM 8.2* 8.4*  --  8.7*  --  8.6*  --  8.1*  --  8.0*  --   --   --   --   --   --  8.9  --    < > = values in this interval not displayed.   CBC Recent Labs  Lab 07/04/21 0305 07/06/21 0249  WBC 5.2 6.9  HGB 12.1* 12.6*  HCT 33.1* 34.5*  MCV 86.6 87.1  PLT 249 278    Medications:     Chlorhexidine Gluconate Cloth  6 each Topical Daily   enoxaparin (LOVENOX) injection  40 mg Subcutaneous Q24H   famotidine  20 mg Oral Daily   lisinopril  20 mg Oral Daily   LORazepam  1 mg Oral Q8H   mouth rinse  15 mL Mouth Rinse BID   methylPREDNISolone (SOLU-MEDROL) injection  40 mg Intravenous Q8H   pantoprazole  40 mg Oral QAC breakfast   sodium chloride flush  10-40 mL Intracatheter Q12H   sucralfate  1 g Oral QID   zolpidem  5 mg Oral QHS   Elmarie Shiley, MD 07/09/2021, 12:31 PM

## 2021-07-09 NOTE — Progress Notes (Signed)
Pt remains very anxious throughout the day despite anti-anxiety meds. Pt remains suicidal. Pt complaining of having issues with swallowing. MD notified at 1305 asking if wanted a swallow eval. MD not inclined to order speech eval at this time.

## 2021-07-10 DIAGNOSIS — T43202A Poisoning by unspecified antidepressants, intentional self-harm, initial encounter: Secondary | ICD-10-CM | POA: Diagnosis not present

## 2021-07-10 DIAGNOSIS — E871 Hypo-osmolality and hyponatremia: Secondary | ICD-10-CM | POA: Diagnosis not present

## 2021-07-10 DIAGNOSIS — F418 Other specified anxiety disorders: Secondary | ICD-10-CM | POA: Diagnosis not present

## 2021-07-10 DIAGNOSIS — T7840XA Allergy, unspecified, initial encounter: Secondary | ICD-10-CM | POA: Diagnosis not present

## 2021-07-10 LAB — BASIC METABOLIC PANEL
Anion gap: 9 (ref 5–15)
BUN: 8 mg/dL (ref 8–23)
CO2: 24 mmol/L (ref 22–32)
Calcium: 7.6 mg/dL — ABNORMAL LOW (ref 8.9–10.3)
Chloride: 97 mmol/L — ABNORMAL LOW (ref 98–111)
Creatinine, Ser: 0.41 mg/dL — ABNORMAL LOW (ref 0.61–1.24)
GFR, Estimated: >60 mL/min (ref >60)
Glucose, Bld: 101 mg/dL — ABNORMAL HIGH (ref 70–99)
Potassium: 3.1 mmol/L — ABNORMAL LOW (ref 3.5–5.1)
Sodium: 130 mmol/L — ABNORMAL LOW (ref 135–145)

## 2021-07-10 LAB — MAGNESIUM: Magnesium: 1.7 mg/dL (ref 1.7–2.4)

## 2021-07-10 MED ORDER — LORAZEPAM 2 MG/ML IJ SOLN
2.0000 mg | Freq: Once | INTRAMUSCULAR | Status: AC
  Administered 2021-07-10: 2 mg via INTRAVENOUS
  Filled 2021-07-10: qty 1

## 2021-07-10 MED ORDER — SODIUM CHLORIDE 0.9 % IV SOLN: INTRAVENOUS | Status: DC | PRN

## 2021-07-10 MED ORDER — LORAZEPAM 1 MG PO TABS
1.0000 mg | ORAL_TABLET | Freq: Three times a day (TID) | ORAL | Status: DC
  Administered 2021-07-10 – 2021-07-15 (×14): 1 mg via ORAL
  Filled 2021-07-10 (×14): qty 1

## 2021-07-10 MED ORDER — MAGNESIUM SULFATE 2 GM/50ML IV SOLN
2.0000 g | Freq: Once | INTRAVENOUS | Status: AC
  Administered 2021-07-10: 2 g via INTRAVENOUS
  Filled 2021-07-10: qty 50

## 2021-07-10 MED ORDER — SODIUM CHLORIDE 0.9 % IV SOLN: INTRAVENOUS | Status: DC

## 2021-07-10 MED ORDER — POTASSIUM CHLORIDE 20 MEQ PO PACK
40.0000 meq | PACK | ORAL | Status: AC
  Administered 2021-07-10 (×2): 40 meq via ORAL
  Filled 2021-07-10 (×2): qty 2

## 2021-07-10 NOTE — Plan of Care (Signed)
°  Problem: Self-Concept: Goal: Will verbalize positive feelings about self Outcome: Not Progressing   Problem: Nutrition: Goal: Adequate nutrition will be maintained Outcome: Not Progressing   Problem: Coping: Goal: Level of anxiety will decrease Outcome: Not Progressing

## 2021-07-10 NOTE — Progress Notes (Signed)
Progress Note   Patient: Anthony Lamb GUR:[id number] DOB: 06/22/1955 DOA: 07/01/2021     8 DOS: the patient was seen and examined on 07/10/2021   Brief hospital course: Mr. Zeiner is a 66 year old male with history of EtOH abuse, ankylosing spondylitis, anxiety, depression, history of colitis and chronic diarrhea, chronic headaches, chronic pain, HTN who presented to the hospital after intentional overdose of approximately 60 tablets of duloxetine.  There were multiple ER visits since September 2022 for anxiety, suicidal ideation, pain, and other issues.  He had been concerned with becoming homeless and endorsed wanting to "die because of this".  He was admitted to the hospital for further work-up as well as poison control evaluation and psychiatry assessment. He was incidentally found to be positive for COVID-19 as well.  Assessment and Plan: * Antidepressant overdose- (present on admission) - s/p evaluation by poison control on admission  Hyponatremia- (present on admission) - severe hypoNa with hx of SIADH. Now s/p ~60 tablets duloxetine with ensuing hypoNA and decreased intake.  - etiology is suspected due to SIADH from duloxetine OD - nephrology also following, appreciate assistance - Na stable off 3% currently; greatly appreciate nephrology assistance - tentative plan would be tolvaptan if acute drop again - continue daily Na level for now -PICC line placed on 07/07/2021; keep in place for now - continue NS at 50 cc/hr  Allergic reaction - no recent new meds other than haldol on 2/2 and rash appeared on evening of 2/3; rash is pruritic and widespread and does appear allergic in nature -Responded well to Benadryl, Pepcid, Solu-Medrol - Rash is improved today.  Continue Benadryl cream - continue bedding changes and routine bathing  Su[facility] attempt [facility] - attempt with duloxetine prior to admission - psychiatry following as well; recommendation is for inpatient psychiatric  hospitalization - Continue IVC and one-to-one safety sitter; IVC renewed on 07/08/2021 -Continue scheduled ativan per psychiatry  Depression with anxiety- (present on admission) - still complaining of ongoing anxiety despite scheduled ativan - adding PRN haldol for now -Still remaining very anxious every day repeating the same things over and over about he "does not know what he is going to do" -Extra dose Ativan being given today  COVID-19 virus infection - diagnosed 07/01/21 - Incidental finding on admission.  Asymptomatic with unremarkable CXR - Remains on room air - Treated with molnupiravir   Alcohol abuse- (present on admission) - continue CIWA although not likely to be withdrawing anymore  - also on scheduled ativan   Gastroesophageal reflux disease- (present on admission) - continue protonix   Essential hypertension- (present on admission) - continue lisinopril; will adjust as needed - can also use labetalol or hydralazine PRN       Subjective: No events overnight.  He continues to remain anxious every day when I see him still perseverating over not knowing what he is going to do and not having anywhere to live and feeling all alone.  He continues to endorse suicidal thoughts to nursing staff almost daily. His rash however is at least improved since yesterday.  Benadryl cream seems to be working the best.  Physical Exam: Vitals:   07/10/21 1000 07/10/21 1017 07/10/21 1100 07/10/21 1109  BP: 139/64 (!) 152/73 (!) 175/70 (!) 162/67  Pulse: 81  (!) 108 (!) 107  Resp: 15  (!) 27 (!) 23  Temp:    98.2 F (36.8 C)  TempSrc:    Oral  SpO2: 95%  95% 91%  Weight:  Height:      Physical Exam Constitutional:      General: He is not in acute distress.    Appearance: Normal appearance.     Comments: Anxious appearing  HENT:     Head: Normocephalic and atraumatic.     Mouth/Throat:     Mouth: Mucous membranes are moist.  Eyes:     Extraocular Movements: Extraocular  movements intact.  Cardiovascular:     Rate and Rhythm: Normal rate and regular rhythm.     Heart sounds: Normal heart sounds.  Pulmonary:     Effort: Pulmonary effort is normal. No respiratory distress.     Breath sounds: Normal breath sounds. No wheezing.  Abdominal:     General: Bowel sounds are normal. There is no distension.     Palpations: Abdomen is soft.     Tenderness: There is no abdominal tenderness.  Musculoskeletal:        General: Normal range of motion.     Cervical back: Normal range of motion and neck supple.  Skin:    Comments: Diffuse scattered pruritic lesions that are erythematous and easily blanching in nature which are tremendously improved compared to prior  Neurological:     General: No focal deficit present.     Mental Status: He is alert.  Psychiatric:     Comments: Anxious mood     Data Reviewed:  I have Reviewed nursing notes, Vitals, and Lab results since pt's last encounter. Pertinent lab results Na 130 I have ordered test including Na level  I have reviewed the last note from staff over past 24 hours,  I have discussed pt's care plan and test results with nursing staff, CM.   Family Communication:   Disposition: Status is: Inpatient Remains inpatient appropriate because: ongoing hypoNa treatment    Planned Discharge Destination:  Psych facility  DVT ppx: Lovenox    Author: Dwyane Dee, MD 07/10/2021 12:40 PM  For on call review [email or web address].

## 2021-07-10 NOTE — Plan of Care (Signed)
°  Problem: Education: Goal: Knowledge of risk factors and measures for prevention of condition will improve Outcome: Progressing   Problem: Coping: Goal: Psychosocial and spiritual needs will be supported Outcome: Progressing   Problem: Respiratory: Goal: Will maintain a patent airway Outcome: Progressing Goal: Complications related to the disease process, condition or treatment will be avoided or minimized Outcome: Progressing   Problem: Activity: Goal: Will identify at least one activity in which they can participate Outcome: Progressing   Problem: Coping: Goal: Ability to identify and develop effective coping behavior will improve Outcome: Progressing Goal: Ability to interact with others will improve Outcome: Progressing Goal: Demonstration of participation in decision-making regarding own care will improve Outcome: Progressing Goal: Ability to use eye contact when communicating with others will improve Outcome: Progressing   Problem: Health Behavior/Discharge Planning: Goal: Identification of resources available to assist in meeting health care needs will improve Outcome: Progressing   Problem: Self-Concept: Goal: Will verbalize positive feelings about self Outcome: Progressing   Problem: Education: Goal: Knowledge of General Education information will improve Description: Including pain rating scale, medication(s)/side effects and non-pharmacologic comfort measures Outcome: Progressing   Problem: Health Behavior/Discharge Planning: Goal: Ability to manage health-related needs will improve Outcome: Progressing   Problem: Clinical Measurements: Goal: Ability to maintain clinical measurements within normal limits will improve Outcome: Progressing Goal: Will remain free from infection Outcome: Progressing Goal: Diagnostic test results will improve Outcome: Progressing Goal: Respiratory complications will improve Outcome: Progressing Goal: Cardiovascular  complication will be avoided Outcome: Progressing   Problem: Activity: Goal: Risk for activity intolerance will decrease Outcome: Progressing   Problem: Nutrition: Goal: Adequate nutrition will be maintained Outcome: Progressing   Problem: Coping: Goal: Level of anxiety will decrease Outcome: Progressing   Problem: Elimination: Goal: Will not experience complications related to bowel motility Outcome: Progressing Goal: Will not experience complications related to urinary retention Outcome: Progressing   Problem: Pain Managment: Goal: General experience of comfort will improve Outcome: Progressing   Problem: Safety: Goal: Ability to remain free from injury will improve Outcome: Progressing   Problem: Skin Integrity: Goal: Risk for impaired skin integrity will decrease Outcome: Progressing

## 2021-07-11 DIAGNOSIS — T43202A Poisoning by unspecified antidepressants, intentional self-harm, initial encounter: Secondary | ICD-10-CM | POA: Diagnosis not present

## 2021-07-11 DIAGNOSIS — E871 Hypo-osmolality and hyponatremia: Secondary | ICD-10-CM | POA: Diagnosis not present

## 2021-07-11 DIAGNOSIS — T1491XA Suicide attempt, initial encounter: Secondary | ICD-10-CM | POA: Diagnosis not present

## 2021-07-11 DIAGNOSIS — T7840XA Allergy, unspecified, initial encounter: Secondary | ICD-10-CM | POA: Diagnosis not present

## 2021-07-11 DIAGNOSIS — T50902A Poisoning by unspecified drugs, medicaments and biological substances, intentional self-harm, initial encounter: Secondary | ICD-10-CM | POA: Diagnosis not present

## 2021-07-11 LAB — BASIC METABOLIC PANEL
Anion gap: 6 (ref 5–15)
BUN: 7 mg/dL — ABNORMAL LOW (ref 8–23)
CO2: 30 mmol/L (ref 22–32)
Calcium: 8.4 mg/dL — ABNORMAL LOW (ref 8.9–10.3)
Chloride: 95 mmol/L — ABNORMAL LOW (ref 98–111)
Creatinine, Ser: 0.52 mg/dL — ABNORMAL LOW (ref 0.61–1.24)
GFR, Estimated: >60 mL/min (ref >60)
Glucose, Bld: 88 mg/dL (ref 70–99)
Potassium: 3.2 mmol/L — ABNORMAL LOW (ref 3.5–5.1)
Sodium: 131 mmol/L — ABNORMAL LOW (ref 135–145)

## 2021-07-11 LAB — MAGNESIUM: Magnesium: 2.1 mg/dL (ref 1.7–2.4)

## 2021-07-11 MED ORDER — ALUM & MAG HYDROXIDE-SIMETH 200-200-20 MG/5ML PO SUSP
30.0000 mL | ORAL | Status: DC | PRN
  Administered 2021-07-11 – 2021-08-14 (×6): 30 mL via ORAL
  Filled 2021-07-11 (×7): qty 30

## 2021-07-11 MED ORDER — ARIPIPRAZOLE 2 MG PO TABS
2.0000 mg | ORAL_TABLET | Freq: Every day | ORAL | Status: DC
  Administered 2021-07-11 – 2021-07-12 (×2): 2 mg via ORAL
  Filled 2021-07-11 (×2): qty 1

## 2021-07-11 MED ORDER — LISINOPRIL 20 MG PO TABS
30.0000 mg | ORAL_TABLET | Freq: Every day | ORAL | Status: DC
  Administered 2021-07-12 – 2021-08-14 (×34): 30 mg via ORAL
  Filled 2021-07-11 (×8): qty 1
  Filled 2021-07-11: qty 3
  Filled 2021-07-11 (×13): qty 1
  Filled 2021-07-11: qty 3
  Filled 2021-07-11 (×5): qty 1
  Filled 2021-07-11: qty 3
  Filled 2021-07-11 (×5): qty 1

## 2021-07-11 MED ORDER — POTASSIUM CHLORIDE 20 MEQ PO PACK
40.0000 meq | PACK | ORAL | Status: AC
  Administered 2021-07-11 (×2): 40 meq via ORAL
  Filled 2021-07-11 (×2): qty 2

## 2021-07-11 NOTE — Progress Notes (Deleted)
Progress Note   Patient: Anthony Lamb UPJ:[id number] DOB: [birth year] DOA: 07/01/2021     9 DOS: the patient was seen and examined on 07/11/2021   Brief hospital course: Anthony Lamb is a 66 year old male with history of EtOH abuse, ankylosing spondylitis, anxiety, depression, history of colitis and chronic diarrhea, chronic headaches, chronic pain, HTN who presented to the hospital after intentional overdose of approximately 60 tablets of duloxetine.  There were multiple ER visits since September 2022 for anxiety, suicidal ideation, pain, and other issues.  He had been concerned with becoming homeless and endorsed wanting to "die because of this".  He was admitted to the hospital for further work-up as well as poison control evaluation and psychiatry assessment. He was incidentally found to be positive for COVID-19 as well.  Assessment and Plan: * Antidepressant overdose- (present on admission) - s/p evaluation by poison control on admission  Hyponatremia- (present on admission) - severe hypoNa with hx of SIADH. Now s/p ~60 tablets duloxetine with ensuing hypoNA and decreased intake.  - etiology is suspected due to SIADH from duloxetine OD - nephrology also following, appreciate assistance - Na stable off 3% currently; greatly appreciate nephrology assistance - tentative plan would be tolvaptan if acute drop again - continue daily Na level for now -PICC line placed on 07/07/2021; keep in place for now - continue NS at 50 cc/hr  Allergic reaction - no recent new meds other than haldol on 2/2 and rash appeared on evening of 2/3; rash is pruritic and widespread and does appear allergic in nature -Responded well to Benadryl, Pepcid, Solu-Medrol - Rash is improved today.  Continue Benadryl cream - continue bedding changes and routine bathing  Suicide attempt [facility]) - attempt with duloxetine prior to admission - psychiatry following as well; recommendation is for inpatient psychiatric  hospitalization - Continue IVC and one-to-one safety sitter; IVC renewed on 07/08/2021 -Continue scheduled ativan per psychiatry  Depression with anxiety- (present on admission) - still complaining of ongoing anxiety despite scheduled ativan - adding PRN haldol for now -Still remaining very anxious every day repeating the same things over and over about he "does not know what he is going to do" -Extra dose Ativan being given today  Alcohol abuse- (present on admission) - on scheduled ativan   Gastroesophageal reflux disease- (present on admission) - continue protonix   Essential hypertension- (present on admission) - continue lisinopril; will adjust as needed - can also use labetalol or hydralazine PRN   COVID-19 virus infection-resolved as of 07/11/2021 - diagnosed 07/01/21; okay to discontinue precautions on 07/12/2021 - Incidental finding on admission.  Asymptomatic with unremarkable CXR - Remains on room air - Treated with molnupiravir       Subjective: Still no events overnight.  Again anxious as soon as I walked in the room continuing to state he does not know what he is going to do.  He has no family and no friends.  Physical Exam: Vitals:   07/11/21 0900 07/11/21 1000 07/11/21 1100 07/11/21 1200  BP: (!) 167/82 (!) 176/78 (!) 163/79 (!) 158/62  Pulse: (!) 103 90 78 81  Resp: 20 (!) 26 (!) 37 17  Temp:    97.8 F (36.6 C)  TempSrc:    Oral  SpO2: 96% 96% 96% 95%  Weight:      Height:      Physical Exam Constitutional:      General: He is not in acute distress.    Appearance: Normal appearance.  Comments: Anxious appearing  HENT:     Head: Normocephalic and atraumatic.     Mouth/Throat:     Mouth: Mucous membranes are moist.  Eyes:     Extraocular Movements: Extraocular movements intact.  Cardiovascular:     Rate and Rhythm: Normal rate and regular rhythm.     Heart sounds: Normal heart sounds.  Pulmonary:     Effort: Pulmonary effort is normal. No  respiratory distress.     Breath sounds: Normal breath sounds. No wheezing.  Abdominal:     General: Bowel sounds are normal. There is no distension.     Palpations: Abdomen is soft.     Tenderness: There is no abdominal tenderness.  Musculoskeletal:        General: Normal range of motion.     Cervical back: Normal range of motion and neck supple.  Skin:    Comments: Minimal scattered erythematous lesions which have dramatically improved after treatment  Neurological:     General: No focal deficit present.     Mental Status: He is alert.  Psychiatric:     Comments: Anxious mood     Data Reviewed:  I have Reviewed nursing notes, Vitals, and Lab results since pt's last encounter. Pertinent lab results Na 131 I have ordered test including Na level  I have reviewed the last note from staff over past 24 hours,  I have discussed pt's care plan and test results with nursing staff, CM.   Family Communication:   Disposition: Status is: Inpatient Remains inpatient appropriate because: ongoing hypoNa treatment    Planned Discharge Destination:  Psych facility  DVT ppx: Lovenox    Author: Dwyane Dee, MD 07/11/2021 1:43 PM  For on call review [email or web address].

## 2021-07-11 NOTE — Consult Note (Signed)
[place] Psychiatry Consult   Reason for Consult:  Suicide attempt by overdose on cymbalta Referring Physician:  Dr. Olevia Bowens Patient Identification: Anthony Lamb MRN:  [id number] Principal Diagnosis: Antidepressant overdose Diagnosis:  Principal Problem:   Antidepressant overdose Active Problems:   Essential hypertension   Depression with anxiety   Hyponatremia   Gastroesophageal reflux disease   Alcohol abuse   Suicide attempt ([place])   Allergic reaction   Total Time spent with patient: 20 minutes  Subjective:   Anthony Lamb is a 66 y.o. male patient admitted with suicide attempt by overdose.  Patient presented after overdosing on (60) 60 mg tablets of Cymbalta, he subsequently developed serotonin syndrome and severe hyponatremia that resulted in admission to intensive care for hyponatremia protocol.  Patient is very well known to our service, as he has presented 14 times to the emergency department for anxiety related concerns.  His presentation is usually after he has used his home supply of benzodiazepine.  Historically patient would endorse suicidal ideations, weakness, difficulty swallowing, and anxiety in order to receive medication during his emergency room visits.  He has no previous suicide attempts, on no previous indication of a suicide attempt prior to this most recent admission.   Patient is seen and assessed by this nurse practitioner.  Patient is observed sitting on the edge of the bed, working with physical therapy.  He requests to speak with psychiatry regarding important concerns.  Patient states he is concerned about his Ativan and Ambien.   Patient reports he continues to have extreme anxiety, difficulty walking, and inability to sleep.  Patient is requesting increasing dose of Ativan and or increase in frequency.  He declines addition of any psychotropic medication to include buspirone, hydroxyzine.  He further declines adding any antidepressant particularly  SSRIs, that will further lower his sodium.  Patient then requests to continue to stay at [facility], in order to continue to receive his medication for his anxiety.  He declines wanting to go into inpatient psychiatric facility.  Patient is reassured that he is able to walk , as he was able to stand up with assistance while his bed was cleaned.  Patient then states he needs a work-up for his throat cancer, again reassurance is provided that previous testing and imaging has not detected any esophageal and or throat cancer.  Although he continues to remain tangential in his thought process, he appears to be fixed on his benzodiazepine treatment.  As previously noted patient's severe benzodiazepine abuse, has resulted in his suicide attempt as he was no longer able to get this medication filled and or prescribed.  He continues to endorse passive suicidal ideations, following lethal suicide attempt on duloxetine after being declined benzodiazepines while in the emergency room.   He does present with improvement in his mental state as he denies suicidal ideation at this time. He continues to require routine Ativan 76m po TID. Once medically stable he will benefit from inpatient psychiatric admission.   HPI:  Anthony MCCANNONis a 66y.o. male with a past medical history significant for hypertension, GERD, peptic ulcer disease, ankylosing spondylitis, irritable bowel syndrome, chronic pain, anxiety, and depression who presents for suicide attempt by overdose.  According to EMS and patient, at 545 this morning, patient tried to overdose by taking 60 duloxetine tablets.  EMS thought that they were 60 mg tablets by report.  Patient reports she is feeling some waxing waning nausea, lightheadedness, and feels like he has no  bowel movement.  He feels very dehydrated and tired.  He also is feeling very anxious and agitated.  He still says he wants to kill himself.  He reports that he was discharged from the  emergency department yesterday at [facility] and he still says he is going to try to kill himself he leaves again today.  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 Nancy Fetter at [facility].  Past Medical History:  Past Medical History:  Diagnosis Date   Alcohol abuse, in remission    Anal fissure    Anemia    Ankylosing spondylitis (HCC)    Anxiety    Arthritis    Chronic diarrhea    Chronic headaches    Chronic pain    Colitis    Colon polyps    Depression    Esophagitis    Gastric polyps    hyperplastic and fundic gland   Gastritis    GERD (gastroesophageal reflux disease)    Hypertension    IBS (irritable bowel syndrome)    Poor dentition    SIADH (syndrome of inappropriate ADH production) ([place])     Past Surgical History:  Procedure Laterality Date   BIOPSY  11/26/2018   Procedure: BIOPSY;  Surgeon: Gatha Mayer, MD;  Location: [facility];  Service: Endoscopy;;   COLONOSCOPY W/ BIOPSIES     COLONOSCOPY WITH PROPOFOL N/A 11/26/2018   Procedure: COLONOSCOPY WITH PROPOFOL;  Surgeon: Gatha Mayer, MD;  Location: [facility];  Service: Endoscopy;  Laterality: N/A;   ESOPHAGOGASTRODUODENOSCOPY     ESOPHAGOGASTRODUODENOSCOPY (EGD) WITH PROPOFOL N/A 11/26/2018   Procedure: ESOPHAGOGASTRODUODENOSCOPY (EGD) WITH PROPOFOL;  Surgeon: Gatha Mayer, MD;  Location: [facility];  Service: Endoscopy;  Laterality: N/A;   HEMOSTASIS CLIP PLACEMENT  11/26/2018   Procedure: HEMOSTASIS CLIP PLACEMENT;  Surgeon: Gatha Mayer, MD;  Location: [facility];  Service: Endoscopy;;   HERNIA REPAIR Bilateral    HOT HEMOSTASIS N/A 11/26/2018   Procedure: HOT HEMOSTASIS (ARGON PLASMA  COAGULATION/BICAP);  Surgeon: Gatha Mayer, MD;  Location: [facility];  Service: Endoscopy;  Laterality: N/A;   OPEN REDUCTION INTERNAL FIXATION (ORIF) DISTAL RADIAL FRACTURE Right 02/11/2018   Procedure: OPEN REDUCTION INTERNAL FIXATION (ORIF) DISTAL RADIAL FRACTURE;  Surgeon: Milly Jakob, MD;  Location: [place];  Service: Orthopedics;  Laterality: Right;   POLYPECTOMY  11/26/2018   Procedure: POLYPECTOMY;  Surgeon: Gatha Mayer, MD;  Location: [facility];  Service: Endoscopy;;   TONSILLECTOMY     Family History:  Family History  Problem Relation Age of Onset   Anxiety disorder Mother    Congestive Heart Failure Mother    Crohn's disease Mother    Colon cancer Mother 59   Arthritis Father    High blood pressure Father    Crohn's disease Father    Family Psychiatric  History: Denies Social History:  Social History   Substance and Sexual Activity  Alcohol Use Not Currently     Social History   Substance and Sexual Activity  Drug Use Not Currently   Types: Marijuana    Social History   Socioeconomic History   Marital status: Single    Spouse name: Not on file   Number of children: 0   Years of education: Not on file  Highest education level: Not on file  Occupational History   Not on file  Tobacco Use   Smoking status: Former    Types: Cigarettes    Quit date: 2014    Years since quitting: 9.1   Smokeless tobacco: Never  Vaping Use   Vaping Use: Never used  Substance and Sexual Activity   Alcohol use: Not Currently   Drug use: Not Currently    Types: Marijuana   Sexual activity: Not Currently  Other Topics Concern   Not on file  Social History Narrative   HSG. Long - term disability - unable to work. Lived with his mother in her house - she died Apr 23, 2023 -    Lives in apartment   Has case worker   Social Determinants of Radio broadcast assistant Strain: Not on file  Food Insecurity: Not on file  Transportation Needs: Not on file  Physical  Activity: Not on file  Stress: Not on file  Social Connections: Not on file   Additional Social History:    Allergies:   Allergies  Allergen Reactions   Nsaids Other (See Comments)    GI upset- history of peptic ulcers!!   Diphenhydramine Hcl Other (See Comments)    Restlessness   Flexeril [Cyclobenzaprine] Other (See Comments)    Restlessness   Fluoxetine Other (See Comments)    Made the patient feel "worse than before" (treatment)   Hctz [Hydrochlorothiazide] Other (See Comments)    Caused to lose sodium when taking with Lisinopril   Rexulti [Brexpiprazole] Other (See Comments)    "Made me not feel right- restless"   Seroquel [Quetiapine] Other (See Comments)    Restless legs and makes the patient sweat- also does not help patient's insomnia. Pt takes this at home in 2023.   Gabapentin Diarrhea   Hydroxyzine Other (See Comments)    Restlessness    Labs:  Results for orders placed or performed during the hospital encounter of 07/01/21 (from the past 48 hour(s))  Basic metabolic panel     Status: Abnormal   Collection Time: 07/10/21  5:17 AM  Result Value Ref Range   Sodium 130 (L) 135 - 145 mmol/L   Potassium 3.1 (L) 3.5 - 5.1 mmol/L   Chloride 97 (L) 98 - 111 mmol/L   CO2 24 22 - 32 mmol/L   Glucose, Bld 101 (H) 70 - 99 mg/dL    Comment: Glucose reference range applies only to samples taken after fasting for at least 8 hours.   BUN 8 8 - 23 mg/dL   Creatinine, Ser 0.41 (L) 0.61 - 1.24 mg/dL   Calcium 7.6 (L) 8.9 - 10.3 mg/dL   GFR, Estimated >60 >60 mL/min    Comment: (NOTE) Calculated using the CKD-EPI Creatinine Equation (2021)    Anion gap 9 5 - 15    Comment: Performed at [facility], [place] [address] [place], [place] [address]  Magnesium     Status: None   Collection Time: 07/10/21  5:17 AM  Result Value Ref Range   Magnesium 1.7 1.7 - 2.4 mg/dL    Comment: Performed at [facility], [place] [address], [place], [place]  [address]  Basic metabolic panel     Status: Abnormal   Collection Time: 07/11/21  5:15 AM  Result Value Ref Range   Sodium 131 (L) 135 - 145 mmol/L   Potassium 3.2 (L) 3.5 - 5.1 mmol/L   Chloride 95 (L) 98 - 111 mmol/L  CO2 30 22 - 32 mmol/L   Glucose, Bld 88 70 - 99 mg/dL    Comment: Glucose reference range applies only to samples taken after fasting for at least 8 hours.   BUN 7 (L) 8 - 23 mg/dL   Creatinine, Ser 0.52 (L) 0.61 - 1.24 mg/dL   Calcium 8.4 (L) 8.9 - 10.3 mg/dL   GFR, Estimated >60 >60 mL/min    Comment: (NOTE) Calculated using the CKD-EPI Creatinine Equation (2021)    Anion gap 6 5 - 15    Comment: Performed at [facility], [place] [address] [place], [place] [address]  Magnesium     Status: None   Collection Time: 07/11/21  5:15 AM  Result Value Ref Range   Magnesium 2.1 1.7 - 2.4 mg/dL    Comment: Performed at [facility], [place] [address], [place], [place] [address]    Current Facility-Administered Medications  Medication Dose Route Frequency Provider Last Rate Last Admin   0.9 %  sodium chloride infusion   Intravenous PRN Dwyane Dee, MD   Stopped at 07/10/21 1930   0.9 %  sodium chloride infusion   Intravenous Continuous Dwyane Dee, MD 50 mL/hr at 07/11/21 0522 Infusion Verify at 07/11/21 0522   acetaminophen (TYLENOL) tablet 650 mg  650 mg Oral Q4H PRN Dwyane Dee, MD   650 mg at 07/11/21 1550   alum & mag hydroxide-simeth (MAALOX/MYLANTA) 200-200-20 MG/5ML suspension 30 mL  30 mL Oral Q4H PRN Dwyane Dee, MD   30 mL at 07/11/21 1550   Chlorhexidine Gluconate Cloth 2 % PADS 6 each  6 each Topical Daily Caren Griffins, MD   6 each at 07/11/21 1975   diphenhydrAMINE-zinc acetate (BENADRYL) 2-0.1 % cream   Topical TID PRN Dwyane Dee, MD   Given at 07/10/21 2056   enoxaparin (LOVENOX) injection 40 mg  40 mg Subcutaneous Q24H Reubin Milan, MD   40 mg at 07/10/21 2101   haloperidol (HALDOL) tablet 2 mg  2  mg Oral Q6H PRN Dwyane Dee, MD   2 mg at 07/11/21 1550   Or   haloperidol lactate (HALDOL) injection 2 mg  2 mg Intramuscular Q6H PRN Dwyane Dee, MD   2 mg at 07/10/21 1722   hydrALAZINE (APRESOLINE) tablet 25 mg  25 mg Oral Q4H PRN Dwyane Dee, MD   25 mg at 07/10/21 0755   hydrOXYzine (ATARAX) tablet 25 mg  25 mg Oral Q4H PRN Dwyane Dee, MD   25 mg at 07/10/21 2059   labetalol (NORMODYNE) injection 10 mg  10 mg Intravenous Q4H PRN Dwyane Dee, MD   10 mg at 07/11/21 1155   [START ON 07/12/2021] lisinopril (ZESTRIL) tablet 30 mg  30 mg Oral Daily Dwyane Dee, MD       LORazepam (ATIVAN) tablet 1 mg  1 mg Oral Q8H Green, Terri L, RPH   1 mg at 07/11/21 1320   MEDLINE mouth rinse  15 mL Mouth Rinse BID Donne Hazel, MD   15 mL at 07/11/21 8832   oxyCODONE (Oxy IR/ROXICODONE) immediate release tablet 5 mg  5 mg Oral Q8H PRN Dwyane Dee, MD   5 mg at 07/11/21 0920   pantoprazole (PROTONIX) EC tablet 40 mg  40 mg Oral QAC breakfast Reubin Milan, MD   40 mg at 07/11/21 0915   phenol (CHLORASEPTIC) mouth spray 1 spray  1 spray Mouth/Throat PRN Donne Hazel, MD   1 spray at 07/10/21 2057  polyethylene glycol (MIRALAX / GLYCOLAX) packet 17 g  17 g Oral Daily PRN Donne Hazel, MD   17 g at 07/06/21 3235   prochlorperazine (COMPAZINE) injection 5 mg  5 mg Intravenous Q6H PRN Reubin Milan, MD   5 mg at 07/08/21 0840   sodium chloride (OCEAN) 0.65 % nasal spray 1 spray  1 spray Each Nare PRN Dwyane Dee, MD   1 spray at 07/10/21 0930   sodium chloride flush (NS) 0.9 % injection 10-40 mL  10-40 mL Intracatheter Q12H Dwyane Dee, MD   20 mL at 07/11/21 0916   sodium chloride flush (NS) 0.9 % injection 10-40 mL  10-40 mL Intracatheter PRN Dwyane Dee, MD       sucralfate (CARAFATE) tablet 1 g  1 g Oral QID Reubin Milan, MD   1 g at 07/11/21 1320   zolpidem (AMBIEN) tablet 5 mg  5 mg Oral QHS Reubin Milan, MD   5 mg at 07/10/21 2101     Musculoskeletal: Strength & Muscle Tone: within normal limits Gait & Station: normal Patient leans: N/A     Psychiatric Specialty Exam:  Presentation  General Appearance: Appropriate for Environment; Casual  Eye Contact:Fair  Speech:Clear and Coherent; Normal Rate  Speech Volume:Normal  Handedness:Right   Mood and Affect  Mood:Anxious  Affect:Appropriate; Congruent   Thought Process  Thought Processes:Coherent; Linear  Descriptions of Associations:Intact  Orientation:Full (Time, Place and Person)  Thought Content:Logical  History of Schizophrenia/Schizoaffective disorder:No  Duration of Psychotic Symptoms:Greater than six months  Hallucinations:Hallucinations: None  Ideas of Reference:Paranoia (belives he has cancer)  Suicidal Thoughts:Suicidal Thoughts: Yes, Passive SI Passive Intent and/or Plan: With Intent; With Plan; With Means to [place]; With Access to Means  Homicidal Thoughts:Homicidal Thoughts: No   Sensorium  Memory:Immediate Fair; Recent Fair; Remote [place]   Executive Functions  Concentration:Fair  Attention Span:Fair  [place]   Psychomotor Activity  Psychomotor Activity:Psychomotor Activity: Restlessness; Tremor   Assets  Assets:Communication Skills; Financial Resources/Insurance; Housing; Social Support   Sleep  Sleep:Sleep: Poor   Physical Exam: Physical Exam Vitals and nursing note reviewed.  Constitutional:      Appearance: Normal appearance. He is normal weight.  HENT:     Head: Normocephalic.  Eyes:     General: Lids are normal.  Neurological:     General: No focal deficit present.     Mental Status: He is alert and oriented to person, place, and time.     Motor: Weakness and tremor present. No atrophy.  Psychiatric:        Attention and Perception: Attention and perception normal.        Mood and Affect: Mood is anxious.         Speech: Speech normal.        Behavior: Behavior normal. Behavior is cooperative.        Thought Content: Thought content normal.        Cognition and Memory: Cognition and memory normal.        Judgment: Judgment normal.   Review of Systems  Neurological:  Positive for tremors, focal weakness and weakness.  Psychiatric/Behavioral:  Positive for depression, substance abuse (alcohol use, last drank 1 month ago) and suicidal ideas (suicide attempt). The patient is nervous/anxious and has insomnia (takes [place]).   All other systems reviewed and are negative. Blood pressure 117/79, pulse 83, temperature 98.3 F (36.8 C), temperature source Oral, resp. rate  15, height 5' 10"  (1.778 m), weight 63.9 kg, SpO2 96 %. Body mass index is 20.21 kg/m.    Treatment Plan Summary: Daily contact with patient to assess and evaluate symptoms and progress in treatment, Medication management, and Plan     Recommended for inpatient psychiatric treatment. Patient unable to contract for safety.  If no appropriate bed at [facility] unit;  fax to surrounding facilities for appropriate bed. -Patient is to remain under involuntary commitment at this time, as he continues to be a danger to himself and unable to contract for safety.   -Once medically stable will begin benzodiazepine taper to include Klonopin 1 mg p.o. twice daily.   -Will add Abilify 2 mg p.o. nightly to target psychosis, depressive symptoms, anxiety, and suicidal ideations. -Patient has been medically cleared, sodium levels have returned to normal Will begin process to refer for inpatient psychiatric admission.  Disposition: Recommend psychiatric Inpatient admission when medically cleared. IVC  Suella Broad, FNP 07/11/2021 4:15 PM

## 2021-07-11 NOTE — Progress Notes (Signed)
Progress Note   Patient: Anthony Lamb XBD:[id number] DOB: 25-Apr-1956 DOA: 07/01/2021     9 DOS: the patient was seen and examined on 07/11/2021   Brief hospital course: [name] is a 66 year old male with history of EtOH abuse, ankylosing spondylitis, anxiety, depression, history of colitis and chronic diarrhea, chronic headaches, chronic pain, HTN who presented to the hospital after intentional overdose of approximately 60 tablets of duloxetine.  There were multiple ER visits since September 2022 for anxiety, suicidal ideation, pain, and other issues.  He had been concerned with becoming homeless and endorsed wanting to "die because of this".  He was admitted to the hospital for further work-up as well as poison control evaluation and psychiatry assessment. He was incidentally found to be positive for COVID-19 as well.  Assessment and Plan: * Antidepressant overdose- (present on admission) - s/p evaluation by poison control on admission  Hyponatremia- (present on admission) - severe hypoNa with hx of SIADH. Now s/p ~60 tablets duloxetine with ensuing hypoNA and decreased intake.  - etiology is suspected due to SIADH from duloxetine OD - nephrology also following, appreciate assistance - Na stable off 3% currently; greatly appreciate nephrology assistance - tentative plan would be tolvaptan if acute drop again - continue daily Na level for now -PICC line placed on 07/07/2021; keep in place for now - continue NS at 50 cc/hr -Sodium continues to improve and remained stable.  If still stable on Tuesday, he will be considered medically stable for discharging to psychiatric facility  Allergic reaction - no recent new meds other than haldol on 2/2 and rash appeared on evening of 2/3; rash is pruritic and widespread and does appear allergic in nature -Responded well to Benadryl, Pepcid, Solu-Medrol - Rash is improved today.  Continue Benadryl cream - continue bedding changes and routine  bathing  Suicide attempt Anthony Lamb) - attempt with duloxetine prior to admission - psychiatry following as well; recommendation is for inpatient psychiatric hospitalization - Continue IVC and one-to-one safety sitter; IVC renewed on 07/08/2021 -Continue scheduled ativan per psychiatry  Depression with anxiety- (present on admission) - still complaining of ongoing anxiety despite scheduled ativan - adding PRN haldol for now -Still remaining very anxious every day repeating the same things over and over about he "does not know what he is going to do" -Extra dose Ativan being given today  Alcohol abuse- (present on admission) - on scheduled ativan   Gastroesophageal reflux disease- (present on admission) - continue protonix   Essential hypertension- (present on admission) - continue lisinopril; will adjust as needed - can also use labetalol or hydralazine PRN   COVID-19 virus infection-resolved as of 07/11/2021 - diagnosed 07/01/21; okay to discontinue precautions on 07/12/2021 - Incidental finding on admission.  Asymptomatic with unremarkable CXR - Remains on room air - Treated with molnupiravir       Subjective: No events overnight.  Sitter at bedside this morning.  Still anxious as usual upon walking into his room. His rash has tremendously improved over the past couple days.  Physical Exam: Vitals:   07/11/21 0900 07/11/21 1000 07/11/21 1100 07/11/21 1200  BP: (!) 167/82 (!) 176/78 (!) 163/79 (!) 158/62  Pulse: (!) 103 90 78 81  Resp: 20 (!) 26 (!) 37 17  Temp:    97.8 F (36.6 C)  TempSrc:    Oral  SpO2: 96% 96% 96% 95%  Weight:      Height:      Physical Exam Constitutional:  General: He is not in acute distress.    Appearance: Normal appearance.     Comments: Anxious appearing  HENT:     Head: Normocephalic and atraumatic.     Mouth/Throat:     Mouth: Mucous membranes are moist.  Eyes:     Extraocular Movements: Extraocular movements intact.  Cardiovascular:      Rate and Rhythm: Normal rate and regular rhythm.     Heart sounds: Normal heart sounds.  Pulmonary:     Effort: Pulmonary effort is normal. No respiratory distress.     Breath sounds: Normal breath sounds. No wheezing.  Abdominal:     General: Bowel sounds are normal. There is no distension.     Palpations: Abdomen is soft.     Tenderness: There is no abdominal tenderness.  Musculoskeletal:        General: Normal range of motion.     Cervical back: Normal range of motion and neck supple.  Skin:    Comments: Minimal scattered erythematous lesions which have dramatically improved after treatment  Neurological:     General: No focal deficit present.     Mental Status: He is alert.  Psychiatric:     Comments: Anxious mood     Data Reviewed:  I have Reviewed nursing notes, Vitals, and Lab results since pt's last encounter. Pertinent lab results Na 131 I have ordered test including Na level  I have reviewed the last note from staff over past 24 hours,  I have discussed pt's care plan and test results with nursing staff, CM.   Family Communication:   Disposition: Will be considered stable on Tuesday as long as sodium remains stable for discharging to psychiatric facility  Status is: Inpatient Remains inpatient appropriate because: ongoing hypoNa treatment    Planned Discharge Destination:  Psych facility  DVT ppx: Lovenox    Author: Dwyane Dee, MD 07/11/2021 1:46 PM  For on call review [email or web address].

## 2021-07-11 NOTE — TOC Progression Note (Signed)
Transition of Care [facility]) - Progression Note    Patient Details  Name: Anthony Lamb MRN: [id number] Date of Birth: 18-Dec-1955  Transition of [facility]) CM/SW Contact  Lennart Pall, LCSW Phone Number: 07/11/2021, 2:36 PM  Clinical Narrative:    Alerted by MD that pt may be medically cleared for dc tomorrow to a Behavioral health unit.  Have alerted Sheran Fava who requested I reach out to [facility].  Spoke with Gerald Leitz who has included the geropsych team to review pt for admission.  Await decision.        Expected Discharge Plan and Services                                                 Social Determinants of Health (SDOH) Interventions    Readmission Risk Interventions No flowsheet data found.

## 2021-07-11 NOTE — Progress Notes (Signed)
Occupational Therapy Treatment Patient Details Name: Anthony Lamb MRN: [id number] DOB: 1955/09/15 Today's Date: 07/11/2021   History of present illness 66 year old male with history of EtOH abuse, ankylosing spondylitis, anxiety, depression, history of colitis and chronic diarrhea, chronic headaches, chronic pain, hypertension, comes into the hospital after taking 60 tablets of duloxetine.  He has had multiple ER visits since September for anxiety, suicidal ideation, pain and other issues   OT comments  Patient required encouragement to participate in session. Patient was able to maintain sitting balance on edge of bed with noted shakiness in RUE and RLE noted. Patient was able to take small side steps up to head of bed with min A +2 and max encouragement. Patient was noted to fixated on negative during session. Patient would continue to benefit from skilled OT services at this time while admitted and after d/c to address noted deficits in order to improve overall safety and independence in ADLs.   Blood pressure:  Supine:166/36mhg Sitting: 145/85 mmhg  Sitting EOB speaking to NP: 164/94 mmhg   Recommendations for follow up therapy are one component of a multi-disciplinary discharge planning process, led by the attending physician.  Recommendations may be updated based on patient status, additional functional criteria and insurance authorization.    Follow Up Recommendations  Skilled nursing-short term rehab (<3 hours/day)    Assistance Recommended at Discharge Frequent or constant Supervision/Assistance  Patient can return home with the following  A lot of help with bathing/dressing/bathroom;A lot of help with walking and/or transfers;Direct supervision/assist for financial management   Equipment Recommendations  Other (comment) (TBD)    Recommendations for Other Services      Precautions / Restrictions Precautions Precautions: Fall Precaution Comments:  orthostatic Restrictions Weight Bearing Restrictions: No       Mobility Bed Mobility Overal bed mobility: Needs Assistance Bed Mobility: Supine to Sit, Sit to Supine     Supine to sit: Min assist Sit to supine: Supervision        Transfers                         Balance Overall balance assessment: Needs assistance Sitting-balance support: Feet supported Sitting balance-Leahy Scale: Fair     Standing balance support: During functional activity, Reliant on assistive device for balance, Bilateral upper extremity supported Standing balance-Leahy Scale: Poor                             ADL either performed or assessed with clinical judgement   ADL Overall ADL's : Needs assistance/impaired                                       General ADL Comments: patient was lying in bed at start of session. patient required max encouragement to preform supine to sit on edge of bed with min A with increased time. patient reported dizziness with supine to sit  BP was noted to drop with movement. nurse made aware. patient was able to scoot up to head of bed with min guard with multiple scoots with cues to complete task. patient was noted to get back into bed attempts increased when psych NP entered room. patient was educated on importance of particiaption in therapy to get stronger. patient was min A +2 for side steps up the head of bed with encouragement from  care team.    Extremity/Trunk Assessment              Vision       Perception     Praxis      Cognition Arousal/Alertness: Awake/alert Behavior During Therapy: WFL for tasks assessed/performed                                   General Comments: patient was noted to fixate on things that could not be changes at the moment. patient was continuosly asking about psych nurse during session.        Exercises      Shoulder Instructions       General Comments       Pertinent Vitals/ Pain       Pain Assessment Pain Assessment: Faces Faces Pain Scale: Hurts little more Pain Location: bilateral shins Pain Descriptors / Indicators: Discomfort, Grimacing Pain Intervention(s): Monitored during session  Home Living                                          Prior Functioning/Environment              Frequency  Min 2X/week        Progress Toward Goals  OT Goals(current goals can now be found in the care plan section)  Progress towards OT goals: Progressing toward goals     Plan Discharge plan remains appropriate    Co-evaluation                 AM-PAC OT "6 Clicks" Daily Activity     Outcome Measure   Help from another person eating meals?: A Little Help from another person taking care of personal grooming?: A Little Help from another person toileting, which includes using toliet, bedpan, or urinal?: A Lot Help from another person bathing (including washing, rinsing, drying)?: A Lot Help from another person to put on and taking off regular upper body clothing?: A Little Help from another person to put on and taking off regular lower body clothing?: A Lot 6 Click Score: 15    End of Session    OT Visit Diagnosis: Unsteadiness on feet (R26.81);Dizziness and giddiness (R42)   Activity Tolerance Other (comment) (patient is self limiting)   Patient Left in chair;with call bell/phone within reach;with nursing/sitter in room   Nurse Communication Mobility status;Other (comment) (BP with movement)        Time: 1941-7408 OT Time Calculation (min): 19 min  Charges: OT General Charges $OT Visit: 1 Visit OT Treatments $Therapeutic Activity: 8-22 mins  Jackelyn Poling OTR/L, MS Acute Rehabilitation Department Office# [phone] Pager# [phone]   Marcellina Millin 07/11/2021, 4:23 PM

## 2021-07-12 DIAGNOSIS — F332 Major depressive disorder, recurrent severe without psychotic features: Secondary | ICD-10-CM

## 2021-07-12 DIAGNOSIS — E871 Hypo-osmolality and hyponatremia: Secondary | ICD-10-CM | POA: Diagnosis not present

## 2021-07-12 DIAGNOSIS — T43202A Poisoning by unspecified antidepressants, intentional self-harm, initial encounter: Secondary | ICD-10-CM | POA: Diagnosis not present

## 2021-07-12 DIAGNOSIS — U071 COVID-19: Secondary | ICD-10-CM | POA: Diagnosis not present

## 2021-07-12 DIAGNOSIS — T1491XA Suicide attempt, initial encounter: Secondary | ICD-10-CM | POA: Diagnosis not present

## 2021-07-12 LAB — BASIC METABOLIC PANEL
Anion gap: 8 (ref 5–15)
BUN: 6 mg/dL — ABNORMAL LOW (ref 8–23)
CO2: 29 mmol/L (ref 22–32)
Calcium: 8.9 mg/dL (ref 8.9–10.3)
Chloride: 93 mmol/L — ABNORMAL LOW (ref 98–111)
Creatinine, Ser: 0.55 mg/dL — ABNORMAL LOW (ref 0.61–1.24)
GFR, Estimated: >60 mL/min (ref >60)
Glucose, Bld: 86 mg/dL (ref 70–99)
Potassium: 3.9 mmol/L (ref 3.5–5.1)
Sodium: 130 mmol/L — ABNORMAL LOW (ref 135–145)

## 2021-07-12 LAB — MAGNESIUM: Magnesium: 2 mg/dL (ref 1.7–2.4)

## 2021-07-12 MED ORDER — BUSPIRONE HCL 10 MG PO TABS
10.0000 mg | ORAL_TABLET | Freq: Three times a day (TID) | ORAL | Status: DC
  Administered 2021-07-12 – 2021-07-19 (×24): 10 mg via ORAL
  Filled 2021-07-12 (×24): qty 1

## 2021-07-12 NOTE — Progress Notes (Signed)
Progress Note   Patient: Anthony Lamb GHW:[id number] DOB: Oct 29, 1955 DOA: 07/01/2021     10 DOS: the patient was seen and examined on 07/12/2021   Brief hospital course: Anthony Lamb is a 66 year old male with history of EtOH abuse, ankylosing spondylitis, anxiety, depression, history of colitis and chronic diarrhea, chronic headaches, chronic pain, HTN who presented to the hospital after intentional overdose of approximately 60 tablets of duloxetine.  There were multiple ER visits since September 2022 for anxiety, suicidal ideation, pain, and other issues.  He had been concerned with becoming homeless and endorsed wanting to "die because of this".  He was admitted to the hospital for further work-up as well as poison control evaluation and psychiatry assessment. He was incidentally found to be positive for COVID-19 as well.  Assessment and Plan: * Antidepressant overdose-resolved as of 07/12/2021, (present on admission) - s/p evaluation by poison control on admission  Hyponatremia- (present on admission) - severe hypoNa with hx of SIADH. Now s/p ~60 tablets duloxetine with ensuing hypoNA and decreased intake.  - etiology is suspected due to SIADH from duloxetine OD - nephrology also following, appreciate assistance - Na stable off 3% currently; greatly appreciate nephrology assistance - tentative plan would be tolvaptan if acute drop again - continue daily Na level for now -PICC line placed on 07/07/2021; keep in place for now - continue NS at 50 cc/hr -Sodium continues to improve and has remained stable.  At this time, he is considered medically stable for discharge to psychiatric facility when found  Allergic reaction - no recent new meds other than haldol on 2/2 and rash appeared on evening of 2/3; rash is pruritic and widespread and does appear allergic in nature -Responded well to Benadryl, Pepcid, Solu-Medrol - Rash is improved today.  Continue Benadryl cream - continue bedding  changes and routine bathing  Suicide attempt [facility]) - attempt with duloxetine prior to admission - psychiatry following as well; recommendation is for inpatient psychiatric hospitalization At this time, he is considered medically stable for discharge to psychiatric facility when found  Major depression, recurrent ([employer])- (present on admission) - Continue IVC and one-to-one safety sitter; IVC renewed on 07/08/2021 -Continue scheduled ativan per psychiatry - started on abilify per psych on 2/6 and also buspar on 2/7 after more discussion with psych  - continue PRN haldol  Alcohol abuse- (present on admission) - on scheduled ativan   Gastroesophageal reflux disease- (present on admission) - continue protonix   Essential hypertension- (present on admission) - continue lisinopril; will adjust as needed - can also use labetalol or hydralazine PRN   COVID-19 virus infection-resolved as of 07/11/2021 - diagnosed 07/01/21; okay to discontinue precautions on 07/12/2021 - Incidental finding on admission.  Asymptomatic with unremarkable CXR - Remains on room air - Treated with molnupiravir       Subjective: No events overnight.  Sitter at bedside this morning.   Same response from patient as usual "I don't know what I'm going to do doctor". He's begging for more medication of any kind for his "anxiety".  Appetite remains fair, eating about 25 to 50% of his meals.  Encouraged him to try and eat better in general.  Physical Exam: Vitals:   07/12/21 0809 07/12/21 0922 07/12/21 0944 07/12/21 1300  BP:  133/76 (!) 146/82   Pulse:   87 76  Resp:   (!) 27 15  Temp: 97.8 F (36.6 C)   98.3 F (36.8 C)  TempSrc: Oral   Oral  SpO2:   Marland Kitchen)  88% 96%  Weight:      Height:      Physical Exam Constitutional:      General: He is not in acute distress.    Appearance: Normal appearance.     Comments: Anxious appearing  HENT:     Head: Normocephalic and atraumatic.     Mouth/Throat:     Mouth: Mucous  membranes are moist.  Eyes:     Extraocular Movements: Extraocular movements intact.  Cardiovascular:     Rate and Rhythm: Normal rate and regular rhythm.     Heart sounds: Normal heart sounds.  Pulmonary:     Effort: Pulmonary effort is normal. No respiratory distress.     Breath sounds: Normal breath sounds. No wheezing.  Abdominal:     General: Bowel sounds are normal. There is no distension.     Palpations: Abdomen is soft.     Tenderness: There is no abdominal tenderness.  Musculoskeletal:        General: Normal range of motion.     Cervical back: Normal range of motion and neck supple.  Skin:    Comments: Minimal scattered erythematous lesions which have dramatically improved after treatment  Neurological:     General: No focal deficit present.     Mental Status: He is alert.  Psychiatric:     Comments: Anxious mood     Data Reviewed:  I have Reviewed nursing notes, Vitals, and Lab results since pt's last encounter. Pertinent lab results Na 130 I have ordered test including Na level  I have reviewed the last note from staff over past 24 hours,  I have discussed pt's care plan and test results with nursing staff, CM.   Family Communication:   Disposition: Stable for discharge to geripsych when facility found.   Status is: Inpatient Remains inpatient appropriate because: ongoing hypoNa treatment    Planned Discharge Destination:  Psych facility  DVT ppx: Lovenox    Author: Dwyane Dee, MD 07/12/2021 1:54 PM  For on call review [email or web address].

## 2021-07-12 NOTE — Progress Notes (Signed)
Physical Therapy Treatment Patient Details Name: Anthony Lamb MRN: [id number] DOB: 1955-09-15 Today's Date: 07/12/2021   History of Present Illness 66 year old male with history of EtOH abuse, ankylosing spondylitis, anxiety, depression, history of colitis and chronic diarrhea, chronic headaches, chronic pain, hypertension, comes into the hospital after taking 60 tablets of duloxetine.  He has had multiple ER visits since September for anxiety, suicidal ideation, pain and other issues    PT Comments    Some encouragement for participation and progressing of activity. +2 for safety but overall pt was Min A. He has multiple complaints. He did c/o dizziness but BP was WNL. Will continue to follow and progress activity as tolerated.    Recommendations for follow up therapy are one component of a multi-disciplinary discharge planning process, led by the attending physician.  Recommendations may be updated based on patient status, additional functional criteria and insurance authorization.  Follow Up Recommendations  Skilled nursing-short term rehab (<3 hours/day)     Assistance Recommended at Discharge Frequent or constant Supervision/Assistance  Patient can return home with the following Assistance with cooking/housework;Direct supervision/assist for medications management;Assist for transportation;Help with stairs or ramp for entrance;A little help with walking and/or transfers;A little help with bathing/dressing/bathroom   Equipment Recommendations  None recommended by PT    Recommendations for Other Services       Precautions / Restrictions Precautions Precautions: Fall Restrictions Weight Bearing Restrictions: No     Mobility  Bed Mobility Overal bed mobility: Needs Assistance Bed Mobility: Supine to Sit     Supine to sit: Min guard, HOB elevated     General bed mobility comments: Min guard for safety. Increased time. Sat EOB for a few minutes 2* pt c/o dizzinss. BP  WNL    Transfers Overall transfer level: Needs assistance Equipment used: Rolling walker (2 wheels) Transfers: Sit to/from Stand Sit to Stand: Min assist, +2 safety/equipment           General transfer comment: Assist to rise, steady, control desent. Cues for safety, technique, hand placement.    Ambulation/Gait Ambulation/Gait assistance: Min assist, +2 safety/equipment Gait Distance (Feet): 7 Feet Assistive device: Rolling walker (2 wheels) Gait Pattern/deviations: Step-through pattern, Decreased stride length       General Gait Details: Assist to steady and manage RW. No LOB with RW use. Pt repeatedly states "my legs down't move."   Stairs             Wheelchair Mobility    Modified Rankin (Stroke Patients Only)       Balance Overall balance assessment: Needs assistance         Standing balance support: During functional activity, Reliant on assistive device for balance, Bilateral upper extremity supported Standing balance-Leahy Scale: Poor                              Cognition Arousal/Alertness: Awake/alert Behavior During Therapy: WFL for tasks assessed/performed Overall Cognitive Status: No family/caregiver present to determine baseline cognitive functioning                   Orientation Level: Disoriented to, Time, Situation             General Comments: fixated on not getting ativan this session        Exercises      General Comments        Pertinent Vitals/Pain Pain Assessment Pain Assessment: Faces Faces Pain Scale: Hurts little more  Pain Location: LEs, back Pain Descriptors / Indicators: Discomfort, Sore Pain Intervention(s): Limited activity within patient's tolerance, Monitored during session, Repositioned    Home Living                          Prior Function            PT Goals (current goals can now be found in the care plan section) Progress towards PT goals: Progressing toward  goals    Frequency    Min 2X/week      PT Plan Current plan remains appropriate    Co-evaluation              AM-PAC PT "6 Clicks" Mobility   Outcome Measure  Help needed turning from your back to your side while in a flat bed without using bedrails?: A Little Help needed moving from lying on your back to sitting on the side of a flat bed without using bedrails?: A Little Help needed moving to and from a bed to a chair (including a wheelchair)?: A Little Help needed standing up from a chair using your arms (e.g., wheelchair or bedside chair)?: A Little Help needed to walk in hospital room?: A Little Help needed climbing 3-5 steps with a railing? : A Lot 6 Click Score: 17    End of Session Equipment Utilized During Treatment: Gait belt Activity Tolerance: Patient tolerated treatment well Patient left: in chair;with call bell/phone within reach;with nursing/sitter in room   PT Visit Diagnosis: Muscle weakness (generalized) (M62.81);Unsteadiness on feet (R26.81)     Time: 0375-4360 PT Time Calculation (min) (ACUTE ONLY): 11 min  Charges:  $Gait Training: 8-22 mins                         Doreatha Massed, PT Acute Rehabilitation  Office: [phone] Pager: [phone]

## 2021-07-12 NOTE — Plan of Care (Signed)
Patient naps on/off throughout the night. When awake he states, "I'm not going to make it" and "I'm dying". Attempted therapeutic communication and active listening.   Problem: Education: Goal: Knowledge of risk factors and measures for prevention of condition will improve Outcome: Progressing   Problem: Coping: Goal: Psychosocial and spiritual needs will be supported Outcome: Progressing   Problem: Respiratory: Goal: Will maintain a patent airway Outcome: Progressing Goal: Complications related to the disease process, condition or treatment will be avoided or minimized Outcome: Progressing   Problem: Coping: Goal: Ability to interact with others will improve Outcome: Progressing   Problem: Education: Goal: Knowledge of General Education information will improve Description: Including pain rating scale, medication(s)/side effects and non-pharmacologic comfort measures Outcome: Progressing   Problem: Health Behavior/Discharge Planning: Goal: Ability to manage health-related needs will improve Outcome: Progressing   Problem: Clinical Measurements: Goal: Ability to maintain clinical measurements within normal limits will improve Outcome: Progressing Goal: Will remain free from infection Outcome: Progressing Goal: Diagnostic test results will improve Outcome: Progressing Goal: Respiratory complications will improve Outcome: Progressing Goal: Cardiovascular complication will be avoided Outcome: Progressing   Problem: Activity: Goal: Risk for activity intolerance will decrease Outcome: Progressing   Problem: Nutrition: Goal: Adequate nutrition will be maintained Outcome: Progressing   Problem: Elimination: Goal: Will not experience complications related to bowel motility Outcome: Progressing Goal: Will not experience complications related to urinary retention Outcome: Progressing   Problem: Safety: Goal: Ability to remain free from injury will improve Outcome:  Progressing   Problem: Skin Integrity: Goal: Risk for impaired skin integrity will decrease Outcome: Progressing   Problem: Activity: Goal: Will identify at least one activity in which they can participate Outcome: Not Progressing   Problem: Coping: Goal: Ability to identify and develop effective coping behavior will improve Outcome: Not Progressing Goal: Demonstration of participation in decision-making regarding own care will improve Outcome: Not Progressing Goal: Ability to use eye contact when communicating with others will improve Outcome: Not Progressing   Problem: Health Behavior/Discharge Planning: Goal: Identification of resources available to assist in meeting health care needs will improve Outcome: Not Progressing   Problem: Self-Concept: Goal: Will verbalize positive feelings about self Outcome: Not Progressing   Problem: Coping: Goal: Level of anxiety will decrease Outcome: Not Progressing   Problem: Pain Managment: Goal: General experience of comfort will improve Outcome: Not Progressing

## 2021-07-12 NOTE — TOC Progression Note (Signed)
Transition of Care [facility]) - Progression Note    Patient Details  Name: Anthony Lamb MRN: [id number] Date of Birth: 04/16/1956  Transition of Care [facility]) CM/SW Contact  Lennart Pall, LCSW Phone Number: 07/12/2021, 10:59 AM  Clinical Narrative:    Pt declined for geropsych bed at [facility].  Have begun full, statewide bed search for geropsych bed. Pt remains under IVC which will require renewal on 07/15/21 if still here.        Expected Discharge Plan and Services                                                 Social Determinants of Health (SDOH) Interventions    Readmission Risk Interventions No flowsheet data found.

## 2021-07-13 DIAGNOSIS — T43202A Poisoning by unspecified antidepressants, intentional self-harm, initial encounter: Secondary | ICD-10-CM | POA: Diagnosis not present

## 2021-07-13 DIAGNOSIS — T43202S Poisoning by unspecified antidepressants, intentional self-harm, sequela: Secondary | ICD-10-CM

## 2021-07-13 DIAGNOSIS — U071 COVID-19: Secondary | ICD-10-CM | POA: Diagnosis not present

## 2021-07-13 DIAGNOSIS — T50902A Poisoning by unspecified drugs, medicaments and biological substances, intentional self-harm, initial encounter: Secondary | ICD-10-CM | POA: Diagnosis not present

## 2021-07-13 LAB — BASIC METABOLIC PANEL
Anion gap: 9 (ref 5–15)
BUN: 6 mg/dL — ABNORMAL LOW (ref 8–23)
CO2: 27 mmol/L (ref 22–32)
Calcium: 8.5 mg/dL — ABNORMAL LOW (ref 8.9–10.3)
Chloride: 94 mmol/L — ABNORMAL LOW (ref 98–111)
Creatinine, Ser: 0.5 mg/dL — ABNORMAL LOW (ref 0.61–1.24)
GFR, Estimated: >60 mL/min (ref >60)
Glucose, Bld: 91 mg/dL (ref 70–99)
Potassium: 3.3 mmol/L — ABNORMAL LOW (ref 3.5–5.1)
Sodium: 130 mmol/L — ABNORMAL LOW (ref 135–145)

## 2021-07-13 LAB — MAGNESIUM: Magnesium: 2.1 mg/dL (ref 1.7–2.4)

## 2021-07-13 MED ORDER — ARIPIPRAZOLE 5 MG PO TABS
5.0000 mg | ORAL_TABLET | Freq: Every day | ORAL | Status: DC
  Administered 2021-07-13 – 2021-07-28 (×16): 5 mg via ORAL
  Filled 2021-07-13 (×17): qty 1

## 2021-07-13 NOTE — TOC Progression Note (Signed)
Transition of Care [facility]) - Progression Note    Patient Details  Name: Anthony Lamb MRN: [id number] Date of Birth: [birth year]  Transition of Care [facility]) CM/SW Contact  Lennart Pall, [place] Phone Number: 07/13/2021, 1:53 PM  Clinical Narrative:    Have submitted pt's information to the following geropsych [facility] facilities:   [facility] Follow up calls to all facilities after clinicals faxed.  Two facilities [facility]) have considered pt for admission, however, have declined due to "medical issues".  Have faxed update clinicals from today to both facilities for reconsideration and await answer from both.  No other facilities have beds available or do not offer any response despite messages left.  TOC will continue to follow for psych placement.        Expected Discharge Plan and Services                                                 Social Determinants of Health (SDOH) Interventions    Readmission Risk Interventions No flowsheet data found.

## 2021-07-13 NOTE — Progress Notes (Signed)
Progress Note   Patient: Anthony Lamb RDE:[id number] DOB: Nov 18, 1955 DOA: 07/01/2021     11 DOS: the patient was seen and examined on 07/13/2021   Brief hospital course: Anthony Lamb is a 66 year old male with history of EtOH abuse, ankylosing spondylitis, anxiety, depression, history of colitis and chronic diarrhea, chronic headaches, chronic pain, HTN who presented to the hospital after intentional overdose of approximately 60 tablets of duloxetine.  There were multiple ER visits since September 2022 for anxiety, suicidal ideation, pain, and other issues.  He had been concerned with becoming homeless and endorsed wanting to "die because of this".  He was admitted to the hospital for further work-up as well as poison control evaluation and psychiatry assessment. He was incidentally found to be positive for COVID-19 as well.  Assessment and Plan: * Antidepressant overdose-resolved as of 07/12/2021, (present on admission) - s/p evaluation by poison control on admission  Allergic reaction - no recent new meds other than haldol on 2/2 and rash appeared on evening of 2/3; rash is pruritic and widespread and does appear allergic in nature -Responded well to Benadryl, Pepcid, Solu-Medrol - Rash is improved today.  Continue Benadryl cream - continue bedding changes and routine bathing  Suicide attempt [facility]) - attempt with duloxetine prior to admission - psychiatry following as well; recommendation is for inpatient psychiatric hospitalization At this time, he is considered medically stable for discharge to psychiatric facility when found  Alcohol abuse- (present on admission) - on scheduled ativan   Gastroesophageal reflux disease- (present on admission) - continue protonix   Hyponatremia- (present on admission) - severe hypoNa with hx of SIADH. Now s/p ~60 tablets duloxetine with ensuing hypoNA and decreased intake.  - etiology is suspected due to SIADH from duloxetine OD - nephrology also  following, appreciate assistance - Na stable off 3% currently; greatly appreciate nephrology assistance - tentative plan would be tolvaptan if acute drop again - continue daily Na level for now -PICC line placed on 07/07/2021; keep in place for now - continue NS at 50 cc/hr -Sodium continues to improve and has remained stable.  At this time, he is considered medically stable for discharge to psychiatric facility when found  Major depression, recurrent (Anthony Lamb)- (present on admission) - Continue IVC and one-to-one safety sitter; IVC renewed on 07/08/2021 -Continue scheduled ativan per psychiatry - started on abilify per psych on 2/6 and also buspar on 2/7 after more discussion with psych  - continue PRN haldol  Essential hypertension- (present on admission) - continue lisinopril; will adjust as needed - can also use labetalol or hydralazine PRN   COVID-19 virus infection-resolved as of 07/11/2021 - diagnosed 07/01/21; okay to discontinue precautions on 07/12/2021 - Incidental finding on admission.  Asymptomatic with unremarkable CXR - Remains on room air - Treated with molnupiravir     Subjective: No significant issues overnight  Physical Exam: Vitals:   07/13/21 0800 07/13/21 0804 07/13/21 0820 07/13/21 0907  BP: (!) 171/65 (!) 157/77  (!) 157/77  Pulse: 73  66 92  Resp: (!) 21  17 (!) 23  Temp:  99.2 F (37.3 C)    TempSrc:  Oral    SpO2: 96%  95% 96%  Weight:      Height:      Physical Exam Constitutional:      General: He is not in acute distress.    Appearance: Normal appearance.     Comments: Anxious appearing  HENT:     Head: Normocephalic and atraumatic.  Mouth/Throat:     Mouth: Mucous membranes are moist.  Eyes:     Extraocular Movements: Extraocular movements intact.  Cardiovascular:     Rate and Rhythm: Normal rate and regular rhythm.     Heart sounds: Normal heart sounds.  Pulmonary:     Effort: Pulmonary effort is normal. No respiratory distress.     Breath  sounds: Normal breath sounds. No wheezing.  Abdominal:     General: Bowel sounds are normal. There is no distension.     Palpations: Abdomen is soft.     Tenderness: There is no abdominal tenderness.  Musculoskeletal:        General: Normal range of motion.     Cervical back: Normal range of motion and neck supple.  Skin:    Comments: Minimal scattered erythematous lesions which have dramatically improved after treatment  Neurological:     General: No focal deficit present.     Mental Status: He is alert.  Psychiatric:     Comments: Anxious mood     Data Reviewed:  I have Reviewed nursing notes, Vitals, and Lab results since pt's last encounter. Pertinent lab results Na 130 I have ordered test including Na level  I have reviewed the last note from staff over past 24 hours,  I have discussed pt's care plan and test results with nursing staff, CM.   Family Communication:   Disposition: Stable for discharge to geripsych when facility found.  Still awaiting psych bed placement    Author: Phillips Grout, MD 07/13/2021 10:02 AM

## 2021-07-13 NOTE — Progress Notes (Signed)
Occupational Therapy Treatment Patient Details Name: Anthony Lamb MRN: [id number] DOB: June 26, 1955 Today's Date: 07/13/2021   History of present illness 66 year old male with history of EtOH abuse, ankylosing spondylitis, anxiety, depression, history of colitis and chronic diarrhea, chronic headaches, chronic pain, hypertension, comes into the hospital after taking 60 tablets of duloxetine.  He has had multiple ER visits since September for anxiety, suicidal ideation, pain and other issues   OT comments  Patient mod I with bed mobility this session. Supervision level for sit to stand and ambulating into bathroom with rolling walker. Patient supervision level to perform standing grooming and hygiene tasks at sink without loss of balance. Min cue when transferring to recliner chair for safety, no physical assistance needed. Encouragement to sit up in chair until lunch time. Patient is progressing well with functional mobility and self cares.    Recommendations for follow up therapy are one component of a multi-disciplinary discharge planning process, led by the attending physician.  Recommendations may be updated based on patient status, additional functional criteria and insurance authorization.    Follow Up Recommendations  Other (comment) (geropsych facility)    Assistance Recommended at Discharge Intermittent Supervision/Assistance  Patient can return home with the following  A little help with walking and/or transfers;A little help with bathing/dressing/bathroom;Direct supervision/assist for medications management;Direct supervision/assist for financial management   Equipment Recommendations  None recommended by OT       Precautions / Restrictions Precautions Precautions: Fall Restrictions Weight Bearing Restrictions: No       Mobility Bed Mobility Overal bed mobility: Modified Independent                     Balance Overall balance assessment: Needs  assistance Sitting-balance support: Feet supported Sitting balance-Leahy Scale: Fair     Standing balance support: During functional activity, No upper extremity supported Standing balance-Leahy Scale: Fair                             ADL either performed or assessed with clinical judgement   ADL Overall ADL's : Needs assistance/impaired     Grooming: Wash/dry face;Wash/dry hands;Brushing hair;Supervision/safety;Standing Grooming Details (indicate cue type and reason): Patient able to maintain balance standing at sink to perform grooming and hygiene tasks without physical assistance Upper Body Bathing: Supervision/ safety;Standing Upper Body Bathing Details (indicate cue type and reason): to wash under arms             Toilet Transfer: Supervision/safety;Cueing for safety;Ambulation;Rolling walker (2 wheels) Toilet Transfer Details (indicate cue type and reason): Patient able to ambulate to/from bathroom with rolling walker at supervision level. min cues to reach back for chair when sitting.         Functional mobility during ADLs: Supervision/safety;Rolling walker (2 wheels)      Extremity/Trunk Assessment Upper Extremity Assessment Upper Extremity Assessment: Overall WFL for tasks assessed             Cognition Arousal/Alertness: Awake/alert Behavior During Therapy: Flat affect Overall Cognitive Status: No family/caregiver present to determine baseline cognitive functioning                                 General Comments: Patient is able to follow 1 step directions appropriately with encouragement. Have to redirect at times as he perseverates on "I'm weak, I'm dizzy"  General Comments VSS on room air    Pertinent Vitals/ Pain       Pain Assessment Pain Assessment: Faces Faces Pain Scale: No hurt Pain Intervention(s): Monitored during session         Frequency  Min 2X/week        Progress Toward  Goals  OT Goals(current goals can now be found in the care plan section)  Progress towards OT goals: Progressing toward goals  Acute Rehab OT Goals Patient Stated Goal: Comb hair OT Goal Formulation: With patient Time For Goal Achievement: 07/20/21 Potential to Achieve Goals: Good ADL Goals Pt Will Perform Lower Body Dressing: with supervision;sit to/from stand Pt Will Transfer to Toilet: with supervision;ambulating;regular height toilet;grab bars Pt Will Perform Toileting - Clothing Manipulation and hygiene: with supervision;sit to/from stand Additional ADL Goal #1: Patient will stand at sink to perform grooming task as evidence of improving activity tolerance Additional ADL Goal #2: Patient will perform 10 min functional activity or exercise activity as evidence of improving activity tolerance  Plan Discharge plan remains appropriate       AM-PAC OT "6 Clicks" Daily Activity     Outcome Measure   Help from another person eating meals?: None Help from another person taking care of personal grooming?: A Little Help from another person toileting, which includes using toliet, bedpan, or urinal?: A Little Help from another person bathing (including washing, rinsing, drying)?: A Little Help from another person to put on and taking off regular upper body clothing?: A Little Help from another person to put on and taking off regular lower body clothing?: A Little 6 Click Score: 19    End of Session Equipment Utilized During Treatment: Rolling walker (2 wheels);Gait belt  OT Visit Diagnosis: Unsteadiness on feet (R26.81);Dizziness and giddiness (R42)   Activity Tolerance Patient tolerated treatment well   Patient Left in chair;with call bell/phone within reach;with nursing/sitter in room   Nurse Communication Mobility status        Time: 4503-8882 OT Time Calculation (min): 19 min  Charges: OT General Charges $OT Visit: 1 Visit OT Treatments $Self Care/Home Management : 8-22  mins  Delbert Phenix OT OT pager: [phone]   Rosemary Holms 07/13/2021, 11:39 AM

## 2021-07-13 NOTE — Consult Note (Signed)
[facility] Consult   Reason for Consult:  Suicide attempt by overdose on cymbalta Referring Physician:  Dr. Olevia Bowens Patient Identification: Anthony Lamb MRN:  [id number] Principal Diagnosis: Antidepressant overdose Diagnosis:  Active Problems:   Essential hypertension   Major depression, recurrent ([place])   Hyponatremia   Gastroesophageal reflux disease   Alcohol abuse   Suicide attempt ([place])   Allergic reaction   Total Time spent with patient: 20 minutes  Subjective:   Anthony Lamb is a 66 y.o. male patient admitted with suicide attempt by overdose.  Patient presented after overdosing on (60) 60 mg tablets of Cymbalta, he subsequently developed serotonin syndrome and severe hyponatremia that resulted in admission to intensive care for hyponatremia protocol.  Patient is very well known to our service, as he has presented 14 times to the emergency department for anxiety related concerns.  His presentation is usually after he has used his home supply of benzodiazepine.  Historically patient would endorse suicidal ideations, weakness, difficulty swallowing, and anxiety in order to receive medication during his emergency room visits.  He has no previous suicide attempts, on no previous indication of a suicide attempt prior to this most recent admission.   Patient is seen and assessed by this nurse practitioner.  Patient is observed lying in bed, brightens upon approach.  Patient continues to endorse having extreme anxiety, that is impairing his ability to walk and participate in his treatment plan.  Discussed with patient his statement contradicts his recent progress note, as he was ambulatory with a walker during physical therapy.  Further chart review shows patient appears to be progressing well with and without cues, and at times not needing assistance despite patient's statement of inability to walk.  Patient continues to perseverate about his anxiety medication and sleep.   He continues to endorse needing an increase in his Ativan " Ativan is the only thing that has ever worked for me.  I need more Ativan. "  Discussed with patient addition of buspirone and Abilify to help improve his current psychiatric conditions.  Patient states " that BuSpar was not working for me.  I cannot tell the difference.  Ativan is the only thing that works. "  He is able to show some improved judgment by noting reason and uses for Abilify, and feels it is appropriate to adjust this medication at this time. He continues to endorse passive suicidal ideations, following lethal suicide attempt on duloxetine after being declined benzodiazepines while in the emergency room.   He does present with improvement in his mental state as he denies suicidal ideation at this time. He continues to require routine Ativan 47m po TID. Once medically stable he will benefit from inpatient psychiatric admission.   HPI:  Anthony MIDDLESWORTHis a 66y.o. male with a past medical history significant for hypertension, GERD, peptic ulcer disease, ankylosing spondylitis, irritable bowel syndrome, chronic pain, anxiety, and depression who presents for suicide attempt by overdose.  According to EMS and patient, at 545 this morning, patient tried to overdose by taking 60 duloxetine tablets.  EMS thought that they were 60 mg tablets by report.  Patient reports she is feeling some waxing waning nausea, lightheadedness, and feels like he has no bowel movement.  He feels very dehydrated and tired.  He also is feeling very anxious and agitated.  He still says he wants to kill himself.  [facility]  still says he is going to try to kill himself he leaves again today.  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 Nancy Fetter at [facility].  Past Medical History:  Past Medical History:  Diagnosis Date   Alcohol abuse, in remission    Anal fissure    Anemia    Ankylosing spondylitis (HCC)    Anxiety    Arthritis    Chronic diarrhea    Chronic headaches    Chronic pain    Colitis    Colon polyps    Depression    Esophagitis    Gastric polyps    hyperplastic and fundic gland   Gastritis    GERD (gastroesophageal reflux disease)    Hypertension    IBS (irritable bowel syndrome)    Poor dentition    SIADH (syndrome of inappropriate ADH production) ([place])     Past Surgical History:  Procedure Laterality Date   BIOPSY  11/26/2018   Procedure: BIOPSY;  Surgeon: Gatha Mayer, MD;  Location: [facility];  Service: Endoscopy;;   COLONOSCOPY W/ BIOPSIES     COLONOSCOPY WITH PROPOFOL N/A 11/26/2018   Procedure: COLONOSCOPY WITH PROPOFOL;  Surgeon: Gatha Mayer, MD;  Location: [facility];  Service: Endoscopy;  Laterality: N/A;   ESOPHAGOGASTRODUODENOSCOPY     ESOPHAGOGASTRODUODENOSCOPY (EGD) WITH PROPOFOL N/A 11/26/2018   Procedure: ESOPHAGOGASTRODUODENOSCOPY (EGD) WITH PROPOFOL;  Surgeon: Gatha Mayer, MD;  Location: [facility];  Service: Endoscopy;  Laterality: N/A;   HEMOSTASIS CLIP PLACEMENT  11/26/2018   Procedure: HEMOSTASIS CLIP PLACEMENT;  Surgeon: Gatha Mayer, MD;  Location: [facility];  Service: Endoscopy;;   HERNIA REPAIR Bilateral    HOT HEMOSTASIS N/A 11/26/2018   Procedure: HOT HEMOSTASIS (ARGON PLASMA COAGULATION/BICAP);  Surgeon: Gatha Mayer, MD;  Location[facility];  Service: Endoscopy;  Laterality: N/A;   OPEN REDUCTION INTERNAL FIXATION (ORIF) DISTAL RADIAL FRACTURE Right 02/11/2018   Procedure: OPEN REDUCTION INTERNAL FIXATION (ORIF) DISTAL RADIAL FRACTURE;  Surgeon: Milly Jakob, MD;  Location: [facility];  Service: Orthopedics;  Laterality: Right;   POLYPECTOMY  11/26/2018   Procedure: POLYPECTOMY;  Surgeon: Gatha Mayer, MD;  Location: [facility];  Service: Endoscopy;;   TONSILLECTOMY     Family History:  Family History  Problem Relation Age of Onset   Anxiety disorder Mother    Congestive Heart Failure Mother    Crohn's disease Mother    Colon cancer Mother 52   Arthritis Father    High blood pressure Father    Crohn's disease Father    Family Psychiatric  History: Denies Social History:  Social History   Substance and Sexual Activity  Alcohol Use Not Currently     Social History   Substance and Sexual Activity  Drug Use Not Currently   Types: Marijuana    Social History   Socioeconomic History   Marital status: Single    Spouse name: Not on file   Number of children: 0   Years of education: Not on file   Highest education level: Not on file  Occupational History   Not on file  Tobacco Use   Smoking status: Former    Types: Cigarettes    Quit date: 2014    Years since quitting: 9.1  Smokeless tobacco: Never  Vaping Use   Vaping Use: Never used  Substance and Sexual Activity   Alcohol use: Not Currently   Drug use: Not Currently    Types: Marijuana   Sexual activity: Not Currently  Other Topics Concern   Not on file  Social History Narrative   HSG. Long - term disability - unable to work. Lived with his mother in her house - she died May 19, 2023 -    Lives in apartment   Has case worker   Social Determinants of Radio broadcast assistant Strain: Not on file  Food Insecurity: Not on file  Transportation Needs: Not on file  Physical Activity: Not on file  Stress: Not on file  Social Connections: Not on file   Additional Social History:    Allergies:   Allergies  Allergen Reactions   Nsaids Other (See Comments)    GI upset- history of peptic ulcers!!   Diphenhydramine Hcl Other (See Comments)    Restlessness   Flexeril [Cyclobenzaprine]  Other (See Comments)    Restlessness   Fluoxetine Other (See Comments)    Made the patient feel "worse than before" (treatment)   Hctz [Hydrochlorothiazide] Other (See Comments)    Caused to lose sodium when taking with Lisinopril   Rexulti [Brexpiprazole] Other (See Comments)    "Made me not feel right- restless"   Seroquel [Quetiapine] Other (See Comments)    Restless legs and makes the patient sweat- also does not help patient's insomnia. Pt takes this at home in 2023.   Gabapentin Diarrhea   Hydroxyzine Other (See Comments)    Restlessness    Labs:  Results for orders placed or performed during the hospital encounter of 07/01/21 (from the past 48 hour(s))  Basic metabolic panel     Status: Abnormal   Collection Time: 07/12/21  5:00 AM  Result Value Ref Range   Sodium 130 (L) 135 - 145 mmol/L   Potassium 3.9 3.5 - 5.1 mmol/L    Comment: DELTA CHECK NOTED NO VISIBLE HEMOLYSIS    Chloride 93 (L) 98 - 111 mmol/L   CO2 29 22 - 32 mmol/L   Glucose, Bld 86 70 - 99 mg/dL    Comment: Glucose reference range applies only to samples taken after fasting for at least 8 hours.   BUN 6 (L) 8 - 23 mg/dL   Creatinine, Ser 0.55 (L) 0.61 - 1.24 mg/dL   Calcium 8.9 8.9 - 10.3 mg/dL   GFR, Estimated >60 >60 mL/min    Comment: (NOTE) Calculated using the CKD-EPI Creatinine Equation (2021)    Anion gap 8 5 - 15    Comment: Performed at [facility], [place] [address] [place], [place] [address]  Magnesium     Status: None   Collection Time: 07/12/21  5:00 AM  Result Value Ref Range   Magnesium 2.0 1.7 - 2.4 mg/dL    Comment: Performed at [facility], [place] [address] [place], [place] [address]  Basic metabolic panel     Status: Abnormal   Collection Time: 07/13/21  2:59 AM  Result Value Ref Range   Sodium 130 (L) 135 - 145 mmol/L   Potassium 3.3 (L) 3.5 - 5.1 mmol/L   Chloride 94 (L) 98 - 111 mmol/L   CO2 27 22 - 32 mmol/L   Glucose, Bld 91 70 - 99  mg/dL    Comment: Glucose reference range applies only to samples taken after fasting for at least  8 hours.   BUN 6 (L) 8 - 23 mg/dL   Creatinine, Ser 0.50 (L) 0.61 - 1.24 mg/dL   Calcium 8.5 (L) 8.9 - 10.3 mg/dL   GFR, Estimated >60 >60 mL/min    Comment: (NOTE) Calculated using the CKD-EPI Creatinine Equation (2021)    Anion gap 9 5 - 15    Comment: Performed at [facility], [place] [address], [place], [place] [address]  Magnesium     Status: None   Collection Time: 07/13/21  2:59 AM  Result Value Ref Range   Magnesium 2.1 1.7 - 2.4 mg/dL    Comment: Performed at [facility], [place] [address], [place], [place] [address]    Current Facility-Administered Medications  Medication Dose Route Frequency Provider Last Rate Last Admin   0.9 %  sodium chloride infusion   Intravenous PRN Dwyane Dee, MD   Stopped at 07/10/21 1930   0.9 %  sodium chloride infusion   Intravenous Continuous Dwyane Dee, MD 50 mL/hr at 07/12/21 1513 Infusion Verify at 07/12/21 1513   acetaminophen (TYLENOL) tablet 650 mg  650 mg Oral Q4H PRN Dwyane Dee, MD   650 mg at 07/13/21 1510   alum & mag hydroxide-simeth (MAALOX/MYLANTA) 200-200-20 MG/5ML suspension 30 mL  30 mL Oral Q4H PRN Dwyane Dee, MD   30 mL at 07/11/21 1550   ARIPiprazole (ABILIFY) tablet 5 mg  5 mg Oral QHS Starkes-Perry, Gayland Curry, FNP       busPIRone (BUSPAR) tablet 10 mg  10 mg Oral TID Dwyane Dee, MD   10 mg at 07/13/21 1511   Chlorhexidine Gluconate Cloth 2 % PADS 6 each  6 each Topical Daily Caren Griffins, MD   6 each at 07/13/21 1511   diphenhydrAMINE-zinc acetate (BENADRYL) 2-0.1 % cream   Topical TID PRN Dwyane Dee, MD   Given at 07/12/21 1016   enoxaparin (LOVENOX) injection 40 mg  40 mg Subcutaneous Q24H Reubin Milan, MD   40 mg at 07/12/21 2100   haloperidol (HALDOL) tablet 2 mg  2 mg Oral Q6H PRN Dwyane Dee, MD   2 mg at 07/13/21 2458   Or   haloperidol lactate  (HALDOL) injection 2 mg  2 mg Intramuscular Q6H PRN Dwyane Dee, MD   2 mg at 07/10/21 1722   hydrALAZINE (APRESOLINE) tablet 25 mg  25 mg Oral Q4H PRN Dwyane Dee, MD   25 mg at 07/12/21 1615   hydrOXYzine (ATARAX) tablet 25 mg  25 mg Oral Q4H PRN Dwyane Dee, MD   25 mg at 07/12/21 0513   labetalol (NORMODYNE) injection 10 mg  10 mg Intravenous Q4H PRN Dwyane Dee, MD   10 mg at 07/13/21 1209   lisinopril (ZESTRIL) tablet 30 mg  30 mg Oral Daily Dwyane Dee, MD   30 mg at 07/13/21 0998   LORazepam (ATIVAN) tablet 1 mg  1 mg Oral Q8H Green, Terri L, RPH   1 mg at 07/13/21 1327   MEDLINE mouth rinse  15 mL Mouth Rinse BID Donne Hazel, MD   15 mL at 07/12/21 2131   oxyCODONE (Oxy IR/ROXICODONE) immediate release tablet 5 mg  5 mg Oral Q8H PRN Dwyane Dee, MD   5 mg at 07/13/21 1226   pantoprazole (PROTONIX) EC tablet 40 mg  40 mg Oral QAC breakfast Reubin Milan, MD   40 mg at 07/13/21 0741   phenol (CHLORASEPTIC) mouth spray 1 spray  1 spray Mouth/Throat PRN Marylu Lund  K, MD   1 spray at 07/11/21 2054   polyethylene glycol (MIRALAX / GLYCOLAX) packet 17 g  17 g Oral Daily PRN Donne Hazel, MD   17 g at 07/06/21 9021   prochlorperazine (COMPAZINE) injection 5 mg  5 mg Intravenous Q6H PRN Reubin Milan, MD   5 mg at 07/12/21 1155   sodium chloride (OCEAN) 0.65 % nasal spray 1 spray  1 spray Each Nare PRN Dwyane Dee, MD   1 spray at 07/12/21 1530   sodium chloride flush (NS) 0.9 % injection 10-40 mL  10-40 mL Intracatheter Q12H Dwyane Dee, MD   10 mL at 07/13/21 2080   sodium chloride flush (NS) 0.9 % injection 10-40 mL  10-40 mL Intracatheter PRN Dwyane Dee, MD       sucralfate (CARAFATE) tablet 1 g  1 g Oral QID Reubin Milan, MD   1 g at 07/13/21 1327   zolpidem (AMBIEN) tablet 5 mg  5 mg Oral QHS Reubin Milan, MD   5 mg at 07/12/21 2100    Musculoskeletal: Strength & Muscle Tone: within normal limits Gait & Station:  normal Patient leans: N/A     Psychiatric Specialty Exam:  Presentation  General Appearance: Appropriate for Environment; Casual  Eye Contact:Fair  Speech:Clear and Coherent; Normal Rate  Speech Volume:Normal  Handedness:Right   Mood and Affect  Mood:Anxious  Affect:Appropriate; Congruent   Thought Process  Thought Processes:Coherent; Linear  Descriptions of Associations:Intact  Orientation:Full (Time, Place and Person)  Thought Content:Logical  History of Schizophrenia/Schizoaffective disorder:No  Duration of Psychotic Symptoms:Greater than six months  Hallucinations:No data recorded  Ideas of Reference:Paranoia (belives he has cancer)  Suicidal Thoughts:No data recorded  Homicidal Thoughts:No data recorded   Sensorium  Memory:Immediate Fair; Recent Fair; Remote Fair  Judgment:Fair  Insight:Lacking   Executive Functions  Concentration:Fair  Attention Span:Fair  [place]   Psychomotor Activity  Psychomotor Activity:No data recorded   Assets  Assets:Communication Skills; Financial Resources/Insurance; Housing; Social Support   Sleep  Sleep:No data recorded   Physical Exam: Physical Exam Vitals and nursing note reviewed.  Constitutional:      Appearance: Normal appearance. He is normal weight.  HENT:     Head: Normocephalic.  Eyes:     General: Lids are normal.  Neurological:     General: No focal deficit present.     Mental Status: He is alert and oriented to person, place, and time.     Motor: Tremor present. No weakness or atrophy.  Psychiatric:        Attention and Perception: Attention and perception normal.        Mood and Affect: Mood is anxious.        Speech: Speech normal.        Behavior: Behavior normal. Behavior is cooperative.        Thought Content: Thought content normal.        Cognition and Memory: Cognition and memory normal.        Judgment: Judgment normal.    Review of Systems  Neurological:  Positive for tremors, focal weakness and weakness.  Psychiatric/Behavioral:  Positive for depression, substance abuse (alcohol use, last drank 1 month ago) and suicidal ideas (suicide attempt). The patient is nervous/anxious and has insomnia (takes [place]).   All other systems reviewed and are negative. Blood pressure (!) 176/79, pulse (!) 101, temperature 98.6 F (37 C), temperature source Oral, resp. rate (!) 25, height 5' 10"  (  1.778 m), weight 63.9 kg, SpO2 95 %. Body mass index is 20.21 kg/m.   Treatment Plan Summary: Daily contact with patient to assess and evaluate symptoms and progress in treatment, Medication management, and Plan     Recommended for inpatient psychiatric treatment. Patient unable to contract for safety.  -Patient is to remain under involuntary commitment at this time, as he continues to be a danger to himself and unable to contract for safety.   -Will increase Abilify 5 mg mg p.o. nightly to target psychosis, depressive symptoms, anxiety, and suicidal ideations.  Continue BuSpar 10 mg p.o. 3 times daily. -Patient's hyponatremia is within normal limits at this time, he is medically stable to discharge to inpatient psychiatric facility.   Disposition: Recommend psychiatric Inpatient admission when medically cleared. IVC  Suella Broad, FNP 07/13/2021 3:49 PM

## 2021-07-14 DIAGNOSIS — T43202S Poisoning by unspecified antidepressants, intentional self-harm, sequela: Secondary | ICD-10-CM | POA: Diagnosis not present

## 2021-07-14 LAB — BASIC METABOLIC PANEL
Anion gap: 8 (ref 5–15)
BUN: 5 mg/dL — ABNORMAL LOW (ref 8–23)
CO2: 28 mmol/L (ref 22–32)
Calcium: 8.6 mg/dL — ABNORMAL LOW (ref 8.9–10.3)
Chloride: 95 mmol/L — ABNORMAL LOW (ref 98–111)
Creatinine, Ser: 0.51 mg/dL — ABNORMAL LOW (ref 0.61–1.24)
GFR, Estimated: >60 mL/min (ref >60)
Glucose, Bld: 92 mg/dL (ref 70–99)
Potassium: 3 mmol/L — ABNORMAL LOW (ref 3.5–5.1)
Sodium: 131 mmol/L — ABNORMAL LOW (ref 135–145)

## 2021-07-14 LAB — MAGNESIUM
Magnesium: 1.9 mg/dL (ref 1.7–2.4)
Magnesium: 1.8 mg/dL (ref 1.7–2.4)

## 2021-07-14 MED ORDER — POTASSIUM CHLORIDE CRYS ER 20 MEQ PO TBCR
20.0000 meq | EXTENDED_RELEASE_TABLET | Freq: Two times a day (BID) | ORAL | Status: DC
  Administered 2021-07-14 – 2021-08-22 (×79): 20 meq via ORAL
  Filled 2021-07-14 (×79): qty 1

## 2021-07-14 NOTE — TOC Progression Note (Signed)
Transition of Care [facility]) - Progression Note    Patient Details  Name: Anthony Lamb MRN: [id number] Date of Birth: 02-28-1956  Transition of Care [facility]) CM/SW Contact  Lennart Pall, LCSW Phone Number: 07/14/2021, 3:32 PM  Clinical Narrative:     Continue to send updated clinicals to all geropsych facilities in state.  IVC will need renewal tomorrow - TOC will facilitate this.       Expected Discharge Plan and Services                                                 Social Determinants of Health (SDOH) Interventions    Readmission Risk Interventions No flowsheet data found.

## 2021-07-14 NOTE — Progress Notes (Signed)
Progress Note   Patient: Anthony Lamb IWL:[id number] DOB: June 29, 1955 DOA: 07/01/2021     12 DOS: the patient was seen and examined on 07/14/2021   Brief hospital course: Mr. Lucier is a 66 year old male with history of EtOH abuse, ankylosing spondylitis, anxiety, depression, history of colitis and chronic diarrhea, chronic headaches, chronic pain, HTN who presented to the hospital after intentional overdose of approximately 60 tablets of duloxetine.  There were multiple ER visits since September 2022 for anxiety, suicidal ideation, pain, and other issues.  He had been concerned with becoming homeless and endorsed wanting to "die because of this".  He was admitted to the hospital for further work-up as well as poison control evaluation and psychiatry assessment. He was incidentally found to be positive for COVID-19 as well.  Assessment and Plan: * Antidepressant overdose-resolved as of 07/12/2021, (present on admission) - s/p evaluation by poison control on admission  Allergic reaction - no recent new meds other than haldol on 2/2 and rash appeared on evening of 2/3; rash is pruritic and widespread and does appear allergic in nature -Responded well to Benadryl, Pepcid, Solu-Medrol - Rash is improved today.  Continue Benadryl cream - continue bedding changes and routine bathing  Suicide attempt [facility]) - attempt with duloxetine prior to admission - psychiatry following as well; recommendation is for inpatient psychiatric hospitalization At this time, he is considered medically stable for discharge to psychiatric facility when found  Alcohol abuse- (present on admission) - on scheduled ativan   Gastroesophageal reflux disease- (present on admission) - continue protonix   Hyponatremia- (present on admission) - severe hypoNa with hx of SIADH. Now s/p ~60 tablets duloxetine with ensuing hypoNA and decreased intake.  - etiology is suspected due to SIADH from duloxetine OD - nephrology also  following, appreciate assistance - Na stable off 3% currently; greatly appreciate nephrology assistance - tentative plan would be tolvaptan if acute drop again - continue daily Na level for now -PICC line placed on 07/07/2021; keep in place for now - continue NS at 50 cc/hr -Sodium continues to improve and has remained stable.  At this time, he is considered medically stable for discharge to psychiatric facility when found  Major depression, recurrent ([place])- (present on admission) - Continue IVC and one-to-one safety sitter; IVC renewed on 07/08/2021 -Continue scheduled ativan per psychiatry - started on abilify per psych on 2/6 and also buspar on 2/7 after more discussion with psych  - continue PRN haldol  Essential hypertension- (present on admission) - continue lisinopril; will adjust as needed - can also use labetalol or hydralazine PRN   COVID-19 virus infection-resolved as of 07/11/2021 - diagnosed 07/01/21; okay to discontinue precautions on 07/12/2021 - Incidental finding on admission.  Asymptomatic with unremarkable CXR - Remains on room air - Treated with molnupiravir     Subjective: No significant issues overnight.  Sitting up in bed eating breakfast  Physical Exam: Vitals:   07/13/21 1832 07/13/21 2000 07/14/21 0009 07/14/21 0400  BP: (!) 150/65 (!) 159/61 (!) 155/64 (!) 116/42  Pulse: 82 87 84 64  Resp: (!) 25 (!) 22 16 20   Temp:  97.7 F (36.5 C) 97.8 F (36.6 C) 97.9 F (36.6 C)  TempSrc:  Oral Oral Oral  SpO2: 95% 93% 96% 95%  Weight:      Height:      Physical Exam Constitutional:      General: He is not in acute distress.    Appearance: Normal appearance.     Comments:  Anxious appearing  HENT:     Head: Normocephalic and atraumatic.     Mouth/Throat:     Mouth: Mucous membranes are moist.  Eyes:     Extraocular Movements: Extraocular movements intact.  Cardiovascular:     Rate and Rhythm: Normal rate and regular rhythm.     Heart sounds: Normal heart  sounds.  Pulmonary:     Effort: Pulmonary effort is normal. No respiratory distress.     Breath sounds: Normal breath sounds. No wheezing.  Abdominal:     General: Bowel sounds are normal. There is no distension.     Palpations: Abdomen is soft.     Tenderness: There is no abdominal tenderness.  Musculoskeletal:        General: Normal range of motion.     Cervical back: Normal range of motion and neck supple.  Skin:    Comments: Minimal scattered erythematous lesions which have dramatically improved after treatment  Neurological:     General: No focal deficit present.     Mental Status: He is alert.  Psychiatric:     Comments: Anxious mood     Data Reviewed:  I have Reviewed nursing notes, Vitals, and Lab results since pt's last encounter. Pertinent lab results Na 130 I have ordered test including Na level  I have reviewed the last note from staff over past 24 hours,  I have discussed pt's care plan and test results with nursing staff, CM.   Family Communication:   Disposition: Stable for discharge to geripsych when facility found.  Still awaiting psych bed placement    Author: Phillips Grout, MD 07/14/2021 9:24 AM

## 2021-07-15 DIAGNOSIS — T43204S Poisoning by unspecified antidepressants, undetermined, sequela: Secondary | ICD-10-CM

## 2021-07-15 DIAGNOSIS — T43202A Poisoning by unspecified antidepressants, intentional self-harm, initial encounter: Secondary | ICD-10-CM | POA: Diagnosis not present

## 2021-07-15 DIAGNOSIS — T50902A Poisoning by unspecified drugs, medicaments and biological substances, intentional self-harm, initial encounter: Secondary | ICD-10-CM | POA: Diagnosis not present

## 2021-07-15 LAB — BASIC METABOLIC PANEL
Anion gap: 7 (ref 5–15)
BUN: 7 mg/dL — ABNORMAL LOW (ref 8–23)
CO2: 28 mmol/L (ref 22–32)
Calcium: 8.5 mg/dL — ABNORMAL LOW (ref 8.9–10.3)
Chloride: 98 mmol/L (ref 98–111)
Creatinine, Ser: 0.5 mg/dL — ABNORMAL LOW (ref 0.61–1.24)
GFR, Estimated: >60 mL/min (ref >60)
Glucose, Bld: 89 mg/dL (ref 70–99)
Potassium: 3.3 mmol/L — ABNORMAL LOW (ref 3.5–5.1)
Sodium: 133 mmol/L — ABNORMAL LOW (ref 135–145)

## 2021-07-15 LAB — MAGNESIUM: Magnesium: 2.1 mg/dL (ref 1.7–2.4)

## 2021-07-15 MED ORDER — LORAZEPAM 1 MG PO TABS
1.0000 mg | ORAL_TABLET | Freq: Three times a day (TID) | ORAL | Status: AC
  Administered 2021-07-15: 1 mg via ORAL
  Filled 2021-07-15: qty 1

## 2021-07-15 MED ORDER — LOPERAMIDE HCL 2 MG PO CAPS
2.0000 mg | ORAL_CAPSULE | ORAL | Status: DC | PRN
  Administered 2021-07-15 – 2021-07-29 (×4): 2 mg via ORAL
  Filled 2021-07-15 (×4): qty 1

## 2021-07-15 MED ORDER — CLONAZEPAM 1 MG PO TABS
1.0000 mg | ORAL_TABLET | Freq: Two times a day (BID) | ORAL | Status: DC
  Administered 2021-07-15 – 2021-07-19 (×9): 1 mg via ORAL
  Filled 2021-07-15 (×9): qty 1

## 2021-07-15 NOTE — Progress Notes (Signed)
Occupational Therapy Treatment Patient Details Name: Anthony Lamb MRN: [id number] DOB: 1956-03-14 Today's Date: 07/15/2021   History of present illness 66 year old male with history of EtOH abuse, ankylosing spondylitis, anxiety, depression, history of colitis and chronic diarrhea, chronic headaches, chronic pain, hypertension, comes into the hospital after taking 60 tablets of duloxetine.  He has had multiple ER visits since September for anxiety, suicidal ideation, pain and other issues   OT comments  Patient continues to make comments such as "I'm going to die" or "I can't" however with encouragement patient is at supervision level for ADL and functional transfers. Patient able to doff/don sock using figure 4 method starting in sitting then semi-supine in bed as instructed by OT to alleviate back pain. Patient ambulate to/from bathroom with walker to comb his hair with no loss of balance noted. Patient has met acute OT goals, will sign off. CM notified, plan is for D/C geripsych facility.    Recommendations for follow up therapy are one component of a multi-disciplinary discharge planning process, led by the attending physician.  Recommendations may be updated based on patient status, additional functional criteria and insurance authorization.    Follow Up Recommendations  Other (comment) (geripsych facility)    Assistance Recommended at Discharge Intermittent Supervision/Assistance     Equipment Recommendations  None recommended by OT       Precautions / Restrictions Precautions Precautions: Fall Restrictions Weight Bearing Restrictions: No       Mobility Bed Mobility Overal bed mobility: Modified Independent                  Transfers Overall transfer level: Needs assistance Equipment used: Rolling walker (2 wheels) Transfers: Sit to/from Stand Sit to Stand: Supervision                 Balance Overall balance assessment: Mild deficits observed, not  formally tested                                         ADL either performed or assessed with clinical judgement   ADL       Grooming: Brushing hair;Supervision/safety;Standing               Lower Body Dressing: Supervision/safety;Sitting/lateral leans;Bed level Lower Body Dressing Details (indicate cue type and reason): Patient initially stating he can't put socks on/off due to back pain/condition. Patient able to doff in sitting using figure 4 method then states he can't do anymore. With encouragement have patient use figure 4 method semi-supine in bed and was able to put sock back on. Toilet Transfer: Supervision/safety;Ambulation;Rolling walker (2 wheels)             General ADL Comments: Patient is at supervision level for self care tasks which is likely his new baseline due to cognitive/psychiatric involvement. Has met OT goals.    Extremity/Trunk Assessment Upper Extremity Assessment Upper Extremity Assessment: Overall WFL for tasks assessed   Lower Extremity Assessment Lower Extremity Assessment: Defer to PT evaluation         Cognition Arousal/Alertness: Awake/alert Behavior During Therapy: Flat affect Overall Cognitive Status: No family/caregiver present to determine baseline cognitive functioning                                 General Comments: Patient continues to state "I can't do it"  or "I'm going to die" however has physical ability to perform tasks with encouragement                   Pertinent Vitals/ Pain       Pain Assessment Pain Assessment: Faces Faces Pain Scale: Hurts little more Pain Location: back Pain Descriptors / Indicators: Discomfort Pain Intervention(s): Monitored during session            Progress Toward Goals  OT Goals(current goals can now be found in the care plan section)  Progress towards OT goals: Goals met/education completed, patient discharged from OT  Acute Rehab OT  Goals Patient Stated Goal: "I'm going to die" OT Goal Formulation: All assessment and education complete, DC therapy  Plan All goals met and education completed, patient discharged from OT services       AM-PAC OT "6 Clicks" Daily Activity     Outcome Measure   Help from another person eating meals?: None Help from another person taking care of personal grooming?: A Little Help from another person toileting, which includes using toliet, bedpan, or urinal?: A Little Help from another person bathing (including washing, rinsing, drying)?: A Little Help from another person to put on and taking off regular upper body clothing?: A Little Help from another person to put on and taking off regular lower body clothing?: A Little 6 Click Score: 19    End of Session Equipment Utilized During Treatment: Rolling walker (2 wheels)  OT Visit Diagnosis: Unsteadiness on feet (R26.81);Dizziness and giddiness (R42)   Activity Tolerance Patient tolerated treatment well   Patient Left in bed;with call bell/phone within reach;with nursing/sitter in room   Nurse Communication Mobility status        Time: 1355-1415 OT Time Calculation (min): 20 min  Charges: OT General Charges $OT Visit: 1 Visit OT Treatments $Self Care/Home Management : 8-22 mins  Delbert Phenix OT OT pager: [phone]   Rosemary Holms 07/15/2021, 2:24 PM

## 2021-07-15 NOTE — Progress Notes (Signed)
Physical Therapy Treatment Patient Details Name: Anthony Lamb MRN: [id number] DOB: Oct 26, 1955 Today's Date: 07/15/2021   History of Present Illness 66 year old male with history of EtOH abuse, ankylosing spondylitis, anxiety, depression, history of colitis and chronic diarrhea, chronic headaches, chronic pain, hypertension, comes into the hospital after taking 60 tablets of duloxetine.  He has had multiple ER visits since September for anxiety, suicidal ideation, pain and other issues    PT Comments    Pt able to perform OOB and ambulate in hallway with min/guard assist for safety.  Pt able to tolerate improved mobility today and does not likely need f/u PT upon d/c. Current plan is for geropsych [facility] facility upon d/c which pt should be able to mobilize around upon d/c.   Recommendations for follow up therapy are one component of a multi-disciplinary discharge planning process, led by the attending physician.  Recommendations may be updated based on patient status, additional functional criteria and insurance authorization.  Follow Up Recommendations  Other (comment) (current plan for geropsych [facility] facilities)     Assistance Recommended at Discharge Frequent or constant Supervision/Assistance  Patient can return home with the following Assistance with cooking/housework;Direct supervision/assist for medications management;Assist for transportation;Help with stairs or ramp for entrance   Equipment Recommendations  Rolling walker (2 wheels)    Recommendations for Other Services       Precautions / Restrictions Precautions Precautions: Fall     Mobility  Bed Mobility Overal bed mobility: Modified Independent                  Transfers Overall transfer level: Needs assistance Equipment used: Rolling walker (2 wheels) Transfers: Sit to/from Stand Sit to Stand: Min guard           General transfer comment: min/guard for safety , cues for hand placement     Ambulation/Gait Ambulation/Gait assistance: Min guard Gait Distance (Feet): 240 Feet Assistive device: Rolling walker (2 wheels) Gait Pattern/deviations: Step-through pattern, Decreased stride length Gait velocity: decr     General Gait Details: pt steady with RW, min/guard for safety however no overt LOB or physical assist required   Stairs             Wheelchair Mobility    Modified Rankin (Stroke Patients Only)       Balance           Standing balance support: No upper extremity supported Standing balance-Leahy Scale: Fair                              Cognition Arousal/Alertness: Awake/alert Behavior During Therapy: Flat affect Overall Cognitive Status: No family/caregiver present to determine baseline cognitive functioning                                 General Comments: able to follow simple commands, appears to have internal distractors and makes excuses for why he cannot do something (however is actually able to perform with redirection or encouragement)        Exercises      General Comments        Pertinent Vitals/Pain Pain Assessment Pain Assessment: Faces Faces Pain Scale: Hurts a little bit Pain Location: abdomen Pain Descriptors / Indicators: Discomfort, Sore Pain Intervention(s): Monitored during session, Repositioned    Home Living  Prior Function            PT Goals (current goals can now be found in the care plan section) Progress towards PT goals: Progressing toward goals    Frequency    Min 2X/week      PT Plan Discharge plan needs to be updated    Co-evaluation              AM-PAC PT "6 Clicks" Mobility   Outcome Measure  Help needed turning from your back to your side while in a flat bed without using bedrails?: A Little Help needed moving from lying on your back to sitting on the side of a flat bed without using bedrails?: A Little Help  needed moving to and from a bed to a chair (including a wheelchair)?: A Little Help needed standing up from a chair using your arms (e.g., wheelchair or bedside chair)?: A Little Help needed to walk in hospital room?: A Little Help needed climbing 3-5 steps with a railing? : A Little 6 Click Score: 18    End of Session Equipment Utilized During Treatment: Gait belt Activity Tolerance: Patient tolerated treatment well Patient left: in bed;with call bell/phone within reach;with nursing/sitter in room Nurse Communication: Mobility status PT Visit Diagnosis: Muscle weakness (generalized) (M62.81);Unsteadiness on feet (R26.81)     Time: 2575-0518 PT Time Calculation (min) (ACUTE ONLY): 14 min  Charges:  $Gait Training: 8-22 mins                    Jannette Spanner PT, DPT Acute Rehabilitation Services Pager: [phone] Office: [place] 07/15/2021, 12:30 PM

## 2021-07-15 NOTE — Consult Note (Signed)
[facility] Consult   Reason for Consult:  Suicide attempt by overdose on cymbalta Referring Physician:  Dr. Olevia Bowens Patient Identification: Anthony Lamb MRN:  [id number] Principal Diagnosis: Antidepressant overdose Diagnosis:  Active Problems:   Essential hypertension   Major depression, recurrent ([place])   Hyponatremia   Gastroesophageal reflux disease   Alcohol abuse   Suicide attempt ([place])   Allergic reaction   Total Time spent with patient: 20 minutes  Subjective:   Anthony Lamb is a 66 y.o. male patient admitted with suicide attempt by overdose.  Patient presented after overdosing on (60) 60 mg tablets of Cymbalta, he subsequently developed serotonin syndrome and severe hyponatremia that resulted in admission to intensive care for hyponatremia protocol.  Patient is very well known to our service, as he has presented 14 times to the emergency department for anxiety related concerns.  His presentation is usually after he has used his home supply of benzodiazepine.  Historically patient would endorse suicidal ideations, weakness, difficulty swallowing, and anxiety in order to receive medication during his emergency room visits.  He has no previous suicide attempts, on no previous indication of a suicide attempt prior to this most recent admission.   Patient is seen and assessed by this nurse practitioner.  Patient is seen and reassessed by this nurse practitioner.  Case discussed with Dr. Lovette Cliche.  Patient today is observed to be lying in bed, with increased physical mobility as he is noted to be able to get up and ambulate independently without any assistance.  When commenting on his improvement patient continues to deflect his progress.  When assessing for overall improvement in his psychiatric symptoms he continues to rate his depression at 9 out of 10 with 10 being the worst on Likert scale.  He also rates his anxiety 10 out of 10 with 10 being the worst on Likert  scale.  Patient states there appears to be no improvement in his anxiety despite being on BuSpar and Ativan.  He does seem to show some interest in considering a longer acting benzodiazepine in place of the Ativan.  He is currently taking Abilify 5 mg p.o. nightly, hydroxyzine 25 mg p.o. every 4 hours as needed, BuSpar 10 mg p.o. 3 times daily.  He is tolerating well at this time, denies any side effects and or adverse reactions.    Although his judgment and insight remains partially impaired, he does agree to medication changes at this time.  Is observed lying in bed, brightens upon approach.   Patient continues to perseverate about his anxiety medication and sleep.  Patient does continue to present with fixed delusions, noting that he has cancer and has been evicted from his apartment despite him having an apartment to return to after his discharge.  Patient seems to be preoccupied with being homeless, and despite reassurance that he has an apartment he continues to state he has been evicted.  He continues to endorse passive suicidal ideations, following lethal suicide attempt on duloxetine after being declined benzodiazepines while in the emergency room.  Patient is now medically stable discharge to inpatient general psych hospital for crisis stabilization, medication management, and behavior modification.   HPI:  Anthony Lamb is a 66 y.o. male with a past medical history significant for hypertension, GERD, peptic ulcer disease, ankylosing spondylitis, irritable bowel syndrome, chronic pain, anxiety, and depression who presents for suicide attempt by overdose.  According to EMS and patient, at 545 this morning, patient tried to overdose by taking  60 duloxetine tablets.  EMS thought that they were 60 mg tablets by report.  Patient reports she is feeling some waxing waning nausea, lightheadedness, and feels like he has no bowel movement.  He feels very dehydrated and tired.  He also is feeling very  anxious and agitated.  He still says he wants to kill himself.  He reports that he was discharged from the emergency department yesterday at [facility] and he still says he is going to try to kill himself he leaves again today.  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 Nancy Fetter at [facility].  Past Medical History:  Past Medical History:  Diagnosis Date   Alcohol abuse, in remission    Anal fissure    Anemia    Ankylosing spondylitis (HCC)    Anxiety    Arthritis    Chronic diarrhea    Chronic headaches    Chronic pain    Colitis    Colon polyps    Depression    Esophagitis    Gastric polyps    hyperplastic and fundic gland   Gastritis    GERD (gastroesophageal reflux disease)    Hypertension    IBS (irritable bowel syndrome)    Poor dentition    SIADH (syndrome of inappropriate ADH production) ([place])     Past Surgical History:  Procedure Laterality Date   BIOPSY  11/26/2018   Procedure: BIOPSY;  Surgeon: Gatha Mayer, MD;  Location: [facility];  Service: Endoscopy;;   COLONOSCOPY W/ BIOPSIES     COLONOSCOPY WITH PROPOFOL N/A 11/26/2018   Procedure: COLONOSCOPY WITH PROPOFOL;  Surgeon: Gatha Mayer, MD;  Location: [facility];  Service: Endoscopy;  Laterality: N/A;   ESOPHAGOGASTRODUODENOSCOPY     ESOPHAGOGASTRODUODENOSCOPY (EGD) WITH PROPOFOL N/A 11/26/2018   Procedure: ESOPHAGOGASTRODUODENOSCOPY (EGD) WITH PROPOFOL;  Surgeon: Gatha Mayer, MD;  Location: [facility];  Service: Endoscopy;  Laterality: N/A;   HEMOSTASIS CLIP PLACEMENT  11/26/2018   Procedure: HEMOSTASIS CLIP PLACEMENT;  Surgeon: Gatha Mayer, MD;  Location: [facility];  Service: Endoscopy;;    HERNIA REPAIR Bilateral    HOT HEMOSTASIS N/A 11/26/2018   Procedure: HOT HEMOSTASIS (ARGON PLASMA COAGULATION/BICAP);  Surgeon: Gatha Mayer, MD;  Location: [facility];  Service: Endoscopy;  Laterality: N/A;   OPEN REDUCTION INTERNAL FIXATION (ORIF) DISTAL RADIAL FRACTURE Right 02/11/2018   Procedure: OPEN REDUCTION INTERNAL FIXATION (ORIF) DISTAL RADIAL FRACTURE;  Surgeon: Milly Jakob, MD;  Location: [facility];  Service: Orthopedics;  Laterality: Right;   POLYPECTOMY  11/26/2018   Procedure: POLYPECTOMY;  Surgeon: Gatha Mayer, MD;  Location: [facility];  Service: Endoscopy;;   TONSILLECTOMY     Family History:  Family History  Problem Relation Age of Onset   Anxiety disorder Mother    Congestive Heart Failure Mother    Crohn's disease Mother    Colon cancer Mother 108   Arthritis Father    High blood pressure Father    Crohn's disease Father    Family Psychiatric  History: Denies Social History:  Social History   Substance and Sexual Activity  Alcohol Use Not Currently     Social History   Substance and Sexual Activity  Drug Use Not Currently   Types: Marijuana    Social History  Socioeconomic History   Marital status: Single    Spouse name: Not on file   Number of children: 0   Years of education: Not on file   Highest education level: Not on file  Occupational History   Not on file  Tobacco Use   Smoking status: Former    Types: Cigarettes    Quit date: 2014    Years since quitting: 9.1   Smokeless tobacco: Never  Vaping Use   Vaping Use: Never used  Substance and Sexual Activity   Alcohol use: Not Currently   Drug use: Not Currently    Types: Marijuana   Sexual activity: Not Currently  Other Topics Concern   Not on file  Social History Narrative   HSG. Long - term disability - unable to work. Lived with his mother in her house - she died 05-04-2023 -    Lives in apartment   Has case worker   Social Determinants of Radio broadcast assistant  Strain: Not on file  Food Insecurity: Not on file  Transportation Needs: Not on file  Physical Activity: Not on file  Stress: Not on file  Social Connections: Not on file   Additional Social History:    Allergies:   Allergies  Allergen Reactions   Nsaids Other (See Comments)    GI upset- history of peptic ulcers!!   Diphenhydramine Hcl Other (See Comments)    Restlessness   Flexeril [Cyclobenzaprine] Other (See Comments)    Restlessness   Fluoxetine Other (See Comments)    Made the patient feel "worse than before" (treatment)   Hctz [Hydrochlorothiazide] Other (See Comments)    Caused to lose sodium when taking with Lisinopril   Rexulti [Brexpiprazole] Other (See Comments)    "Made me not feel right- restless"   Seroquel [Quetiapine] Other (See Comments)    Restless legs and makes the patient sweat- also does not help patient's insomnia. Pt takes this at home in 2023.   Gabapentin Diarrhea   Hydroxyzine Other (See Comments)    Restlessness    Labs:  Results for orders placed or performed during the hospital encounter of 07/01/21 (from the past 48 hour(s))  Basic metabolic panel     Status: Abnormal   Collection Time: 07/14/21  6:00 AM  Result Value Ref Range   Sodium 131 (L) 135 - 145 mmol/L   Potassium 3.0 (L) 3.5 - 5.1 mmol/L   Chloride 95 (L) 98 - 111 mmol/L   CO2 28 22 - 32 mmol/L   Glucose, Bld 92 70 - 99 mg/dL    Comment: Glucose reference range applies only to samples taken after fasting for at least 8 hours.   BUN 5 (L) 8 - 23 mg/dL   Creatinine, Ser 0.51 (L) 0.61 - 1.24 mg/dL   Calcium 8.6 (L) 8.9 - 10.3 mg/dL   GFR, Estimated >60 >60 mL/min    Comment: (NOTE) Calculated using the CKD-EPI Creatinine Equation (2021)    Anion gap 8 5 - 15    Comment: Performed at [facility], [place] [address] [place], [place] [address]  Magnesium     Status: None   Collection Time: 07/14/21  6:00 AM  Result Value Ref Range   Magnesium 1.9 1.7 - 2.4  mg/dL    Comment: Performed at [facility], [place] [address], [place], [place] [address]  Magnesium     Status: None   Collection Time: 07/14/21  8:00 AM  Result Value Ref  Range   Magnesium 1.8 1.7 - 2.4 mg/dL    Comment: Performed at [facility], [place] [address], [place], [place] [address]  Basic metabolic panel     Status: Abnormal   Collection Time: 07/15/21  3:27 AM  Result Value Ref Range   Sodium 133 (L) 135 - 145 mmol/L   Potassium 3.3 (L) 3.5 - 5.1 mmol/L   Chloride 98 98 - 111 mmol/L   CO2 28 22 - 32 mmol/L   Glucose, Bld 89 70 - 99 mg/dL    Comment: Glucose reference range applies only to samples taken after fasting for at least 8 hours.   BUN 7 (L) 8 - 23 mg/dL   Creatinine, Ser 0.50 (L) 0.61 - 1.24 mg/dL   Calcium 8.5 (L) 8.9 - 10.3 mg/dL   GFR, Estimated >60 >60 mL/min    Comment: (NOTE) Calculated using the CKD-EPI Creatinine Equation (2021)    Anion gap 7 5 - 15    Comment: Performed at [facility], [place] [address], [place], [place] [address]  Magnesium     Status: None   Collection Time: 07/15/21  3:27 AM  Result Value Ref Range   Magnesium 2.1 1.7 - 2.4 mg/dL    Comment: Performed at [facility], [place] [address], [place], [place] [address]    Current Facility-Administered Medications  Medication Dose Route Frequency Provider Last Rate Last Admin   0.9 %  sodium chloride infusion   Intravenous PRN Dwyane Dee, MD   Stopped at 07/10/21 1930   0.9 %  sodium chloride infusion   Intravenous Continuous Dwyane Dee, MD 50 mL/hr at 07/14/21 1744 New Bag at 07/14/21 1744   acetaminophen (TYLENOL) tablet 650 mg  650 mg Oral Q4H PRN Dwyane Dee, MD   650 mg at 07/15/21 1122   alum & mag hydroxide-simeth (MAALOX/MYLANTA) 200-200-20 MG/5ML suspension 30 mL  30 mL Oral Q4H PRN Dwyane Dee, MD   30 mL at 07/15/21 1122   ARIPiprazole (ABILIFY) tablet 5 mg  5 mg Oral QHS Suella Broad, FNP   5 mg at 07/14/21 2134   busPIRone (BUSPAR) tablet 10 mg  10 mg Oral TID Dwyane Dee, MD   10 mg at 07/15/21 1122   Chlorhexidine Gluconate Cloth 2 % PADS 6 each  6 each Topical Daily Caren Griffins, MD   6 each at 07/14/21 0945   diphenhydrAMINE-zinc acetate (BENADRYL) 2-0.1 % cream   Topical TID PRN Dwyane Dee, MD   Given at 07/12/21 1016   enoxaparin (LOVENOX) injection 40 mg  40 mg Subcutaneous Q24H Reubin Milan, MD   40 mg at 07/14/21 2125   haloperidol (HALDOL) tablet 2 mg  2 mg Oral Q6H PRN Dwyane Dee, MD   2 mg at 07/14/21 2317   Or   haloperidol lactate (HALDOL) injection 2 mg  2 mg Intramuscular Q6H PRN Dwyane Dee, MD   2 mg at 07/10/21 1722   hydrALAZINE (APRESOLINE) tablet 25 mg  25 mg Oral Q4H PRN Dwyane Dee, MD   25 mg at 07/13/21 1701   hydrOXYzine (ATARAX) tablet 25 mg  25 mg Oral Q4H PRN Dwyane Dee, MD   25 mg at 07/15/21 1122   labetalol (NORMODYNE) injection 10 mg  10 mg Intravenous Q4H PRN Dwyane Dee, MD   10 mg at 07/13/21 1209   lisinopril (ZESTRIL) tablet 30 mg  30 mg Oral Daily Dwyane Dee, MD   30 mg at 07/15/21  1122   LORazepam (ATIVAN) tablet 1 mg  1 mg Oral Q8H Green, Terri L, RPH   1 mg at 07/15/21 6468   MEDLINE mouth rinse  15 mL Mouth Rinse BID Donne Hazel, MD   15 mL at 07/14/21 2128   oxyCODONE (Oxy IR/ROXICODONE) immediate release tablet 5 mg  5 mg Oral Q8H PRN Dwyane Dee, MD   5 mg at 07/15/21 0525   pantoprazole (PROTONIX) EC tablet 40 mg  40 mg Oral QAC breakfast Reubin Milan, MD   40 mg at 07/15/21 0855   phenol (CHLORASEPTIC) mouth spray 1 spray  1 spray Mouth/Throat PRN Donne Hazel, MD   1 spray at 07/11/21 2054   polyethylene glycol (MIRALAX / GLYCOLAX) packet 17 g  17 g Oral Daily PRN Donne Hazel, MD   17 g at 07/06/21 0321   potassium chloride SA (KLOR-CON M) CR tablet 20 mEq  20 mEq Oral BID Derrill Kay A, MD   20 mEq at 07/15/21 1122   prochlorperazine (COMPAZINE) injection 5 mg   5 mg Intravenous Q6H PRN Reubin Milan, MD   5 mg at 07/15/21 2248   sodium chloride (OCEAN) 0.65 % nasal spray 1 spray  1 spray Each Nare PRN Dwyane Dee, MD   1 spray at 07/12/21 1530   sodium chloride flush (NS) 0.9 % injection 10-40 mL  10-40 mL Intracatheter Q12H Dwyane Dee, MD   10 mL at 07/15/21 0328   sodium chloride flush (NS) 0.9 % injection 10-40 mL  10-40 mL Intracatheter PRN Dwyane Dee, MD       sucralfate (CARAFATE) tablet 1 g  1 g Oral QID Reubin Milan, MD   1 g at 07/14/21 2125   zolpidem (AMBIEN) tablet 5 mg  5 mg Oral QHS Reubin Milan, MD   5 mg at 07/14/21 2126    Musculoskeletal: Strength & Muscle Tone: within normal limits Gait & Station: normal Patient leans: N/A     Psychiatric Specialty Exam:  Presentation  General Appearance: Appropriate for Environment; Disheveled  Eye Contact:Fair  Speech:Clear and Coherent; Normal Rate  Speech Volume:Normal  Handedness:Right   Mood and Affect  Mood:Anxious; Depressed  Affect:Appropriate; Congruent   Thought Process  Thought Processes:Coherent; Linear  Descriptions of Associations:Intact  Orientation:Full (Time, Place and Person)  Thought Content:Logical  History of Schizophrenia/Schizoaffective disorder:No  Duration of Psychotic Symptoms:Greater than six months  Hallucinations:Hallucinations: None  Ideas of Reference:Paranoia (believes he has cancer and has been evicted from his apartment.)  Suicidal Thoughts:Suicidal Thoughts: Yes, Passive SI Passive Intent and/or Plan: With Access to Means; With Means to Carry Out; With Plan; With Intent  Homicidal Thoughts:Homicidal Thoughts: No   Sensorium  Memory:Immediate Fair; Recent Fair; Remote Fair  Judgment:Fair  Insight:Poor   Executive Functions  Concentration:Fair  Attention Span:Fair  [place]   Psychomotor Activity  Psychomotor Activity:Psychomotor  Activity: Restlessness; Tremor   Assets  Assets:Communication Skills; Financial Resources/Insurance; Housing; Social Support; Leisure Time; Resilience   Sleep  Sleep:Sleep: Fair   Physical Exam: Physical Exam Vitals and nursing note reviewed.  Constitutional:      Appearance: Normal appearance. He is normal weight.  HENT:     Head: Normocephalic.  Eyes:     General: Lids are normal.  Neurological:     General: No focal deficit present.     Mental Status: He is alert and oriented to person, place, and time. Mental status is at baseline.  Motor: Tremor present. No atrophy.  Psychiatric:        Attention and Perception: Attention and perception normal.        Mood and Affect: Mood is anxious.        Speech: Speech normal.        Behavior: Behavior normal. Behavior is cooperative.        Thought Content: Thought content normal.        Cognition and Memory: Cognition and memory normal.        Judgment: Judgment normal.   Review of Systems  Neurological:  Positive for tremors, focal weakness and weakness.  Psychiatric/Behavioral:  Positive for depression, substance abuse (alcohol use, last drank 1 month ago) and suicidal ideas (suicide attempt). The patient is nervous/anxious and has insomnia (takes [place]).   All other systems reviewed and are negative. Blood pressure (!) 146/93, pulse 86, temperature 98.2 F (36.8 C), temperature source Oral, resp. rate 20, height 5' 10"  (1.778 m), weight 63.9 kg, SpO2 99 %. Body mass index is 20.21 kg/m.   Treatment Plan Summary: Daily contact with patient to assess and evaluate symptoms and progress in treatment, Medication management, and Plan     Recommended for inpatient psychiatric treatment. Patient unable to contract for safety.  -Patient is to remain under involuntary commitment at this time, as he continues to be a danger to himself and unable to contract for safety.   -Will continue Abilify 5 mg mg p.o. nightly to target  psychosis, depressive symptoms, anxiety, and suicidal ideations.  Continue BuSpar 10 mg p.o. 3 times daily. -Will start Klonopin 1 mg p.o. twice daily for anxiety.  We will discontinue Ativan at this time. -He is medically stable to discharge to inpatient psychiatric facility.   Disposition: Recommend psychiatric Inpatient admission when medically cleared. IVC  Suella Broad, FNP 07/15/2021 12:35 PM

## 2021-07-15 NOTE — TOC Progression Note (Signed)
Transition of Care [facility]) - Progression Note    Patient Details  Name: Anthony Lamb MRN: [id number] Date of Birth: 12/25/1955  Transition [facility] CM/SW [place], [place] Phone Number: 07/15/2021, 2:28 PM  Clinical Narrative:   Updated notes indicating patient is medically stable and updated IVC paperwork faxed to [facility] and [facility] for review. TOC will continue to follow during the course of hospitalization.          Expected Discharge Plan and Services                                                 Social Determinants of Health (SDOH) Interventions    Readmission Risk Interventions No flowsheet data found.

## 2021-07-15 NOTE — Progress Notes (Signed)
Progress Note   Patient: Anthony Lamb IOE:[id number] DOB: March 31, 1956 DOA: 07/01/2021     13 DOS: the patient was seen and examined on 07/15/2021   Brief hospital course: Anthony Lamb is a 66 year old male with history of EtOH abuse, ankylosing spondylitis, anxiety, depression, history of colitis and chronic diarrhea, chronic headaches, chronic pain, HTN who presented to the hospital after intentional overdose of approximately 60 tablets of duloxetine.  There were multiple ER visits since September 2022 for anxiety, suicidal ideation, pain, and other issues.  He had been concerned with becoming homeless and endorsed wanting to "die because of this".  He was admitted to the hospital for further work-up as well as poison control evaluation and psychiatry assessment. He was incidentally found to be positive for COVID-19 as well.  Assessment and Plan: * Antidepressant overdose-resolved as of 07/12/2021, (present on admission) - s/p evaluation by poison control on admission  Allergic reaction - no recent new meds other than haldol on 2/2 and rash appeared on evening of 2/3; rash is pruritic and widespread and does appear allergic in nature -Responded well to Benadryl, Pepcid, Solu-Medrol - Rash is improved today.  Continue Benadryl cream - continue bedding changes and routine bathing  Suicide attempt [facility]) - attempt with duloxetine prior to admission - psychiatry following as well; recommendation is for inpatient psychiatric hospitalization At this time, he is considered medically stable for discharge to psychiatric facility when found  Alcohol abuse- (present on admission) - on scheduled ativan   Gastroesophageal reflux disease- (present on admission) - continue protonix   Hyponatremia- (present on admission) - severe hypoNa with hx of SIADH. Now s/p ~60 tablets duloxetine with ensuing hypoNA and decreased intake.  - etiology is suspected due to SIADH from duloxetine OD - nephrology  also following, appreciate assistance - Na stable off 3% currently; greatly appreciate nephrology assistance - tentative plan would be tolvaptan if acute drop again - continue daily Na level for now -PICC line placed on 07/07/2021; keep in place for now - continue NS at 50 cc/hr -Sodium continues to improve and has remained stable.  At this time, he is considered medically stable for discharge to psychiatric facility when found  Major depression, recurrent ([place])- (present on admission) - Continue IVC and one-to-one safety sitter; IVC renewed on 07/08/2021 -Continue scheduled ativan per psychiatry - started on abilify per psych on 2/6 and also buspar on 2/7 after more discussion with psych  - continue PRN haldol  Essential hypertension- (present on admission) - continue lisinopril; will adjust as needed - can also use labetalol or hydralazine PRN   COVID-19 virus infection-resolved as of 07/11/2021 - diagnosed 07/01/21; okay to discontinue precautions on 07/12/2021 - Incidental finding on admission.  Asymptomatic with unremarkable CXR - Remains on room air - Treated with molnupiravir     Subjective: No significant issues overnight.  Feels nauseous today  Physical Exam: Vitals:   07/14/21 1645 07/14/21 1833 07/14/21 1917 07/15/21 0327  BP: (!) 171/75  (!) 155/93 128/77  Pulse: 88  81 77  Resp:  20 18 16   Temp: 97.9 F (36.6 C)  98.1 F (36.7 C) 97.8 F (36.6 C)  TempSrc: Oral  Oral Oral  SpO2: 96%  96% 96%  Weight:      Height:      Physical Exam Constitutional:      General: He is not in acute distress.    Appearance: Normal appearance.     Comments: Anxious appearing  HENT:  Head: Normocephalic and atraumatic.     Mouth/Throat:     Mouth: Mucous membranes are moist.  Eyes:     Extraocular Movements: Extraocular movements intact.  Cardiovascular:     Rate and Rhythm: Normal rate and regular rhythm.     Heart sounds: Normal heart sounds.  Pulmonary:     Effort:  Pulmonary effort is normal. No respiratory distress.     Breath sounds: Normal breath sounds. No wheezing.  Abdominal:     General: Bowel sounds are normal. There is no distension.     Palpations: Abdomen is soft.     Tenderness: There is no abdominal tenderness.  Musculoskeletal:        General: Normal range of motion.     Cervical back: Normal range of motion and neck supple.  Skin:    General: Skin is warm and dry.     Findings: No rash.  Neurological:     General: No focal deficit present.     Mental Status: He is alert.  Psychiatric:     Comments: Anxious mood      Family Communication:   Disposition: Stable for discharge to geripsych when facility found.  Still awaiting psych bed placement    Author: Phillips Grout, MD 07/15/2021 9:41 AM

## 2021-07-15 NOTE — Plan of Care (Signed)
Plan of care reviewed and discussed with the patient. 

## 2021-07-16 ENCOUNTER — Encounter (HOSPITAL_COMMUNITY): Payer: Self-pay | Admitting: Internal Medicine

## 2021-07-16 DIAGNOSIS — E871 Hypo-osmolality and hyponatremia: Secondary | ICD-10-CM | POA: Diagnosis not present

## 2021-07-16 DIAGNOSIS — T43202A Poisoning by unspecified antidepressants, intentional self-harm, initial encounter: Secondary | ICD-10-CM | POA: Diagnosis not present

## 2021-07-16 DIAGNOSIS — I10 Essential (primary) hypertension: Secondary | ICD-10-CM | POA: Diagnosis not present

## 2021-07-16 DIAGNOSIS — F332 Major depressive disorder, recurrent severe without psychotic features: Secondary | ICD-10-CM | POA: Diagnosis not present

## 2021-07-16 LAB — MAGNESIUM: Magnesium: 2.1 mg/dL (ref 1.7–2.4)

## 2021-07-16 LAB — BASIC METABOLIC PANEL
Anion gap: 6 (ref 5–15)
BUN: 6 mg/dL — ABNORMAL LOW (ref 8–23)
CO2: 29 mmol/L (ref 22–32)
Calcium: 8.5 mg/dL — ABNORMAL LOW (ref 8.9–10.3)
Chloride: 99 mmol/L (ref 98–111)
Creatinine, Ser: 0.42 mg/dL — ABNORMAL LOW (ref 0.61–1.24)
GFR, Estimated: >60 mL/min (ref >60)
Glucose, Bld: 85 mg/dL (ref 70–99)
Potassium: 3.5 mmol/L (ref 3.5–5.1)
Sodium: 134 mmol/L — ABNORMAL LOW (ref 135–145)

## 2021-07-16 NOTE — Progress Notes (Signed)
Progress Note    Anthony Lamb  LPF:[id number] DOB: Oct 21, 1955  DOA: 07/01/2021 PCP: Sandi Mariscal, MD      Brief Narrative:    Medical records reviewed and are as summarized below:   [name] is a 66 year old male with history of EtOH abuse, ankylosing spondylitis, anxiety, depression, history of colitis and chronic diarrhea, chronic headaches, chronic pain, HTN who presented to the hospital after intentional overdose of approximately 60 tablets of duloxetine.  There were multiple ER visits since September 2022 for anxiety, suicidal ideation, pain, and other issues.  He had been concerned with becoming homeless and endorsed wanting to "die because of this".  He was admitted to the hospital for further work-up as well as poison control evaluation and psychiatry assessment. He was incidentally found to be positive for COVID-19 as well.      Assessment/Plan:   Active Problems:   Essential hypertension   Major depression, recurrent (HCC)   Hyponatremia   Gastroesophageal reflux disease   Alcohol abuse   Suicide attempt ([place])   Allergic reaction   Body mass index is 20.21 kg/m.   Major depression, suicide attempt, overdose with Cymbalta: Continue psychotropics.  Awaiting placement to behavioral health unit.  Follow-up with psychiatrist.  Hyponatremia: Improved.  Discontinue IV normal saline infusion.  Nephrologist has signed off.  Recent allergic reaction with rash on 07/08/2021: Resolved  Incidental COVID-19 infection: Completed treatment with molnupiravir on 07/06/2021 positive test on 07/01/2021.  Other comorbidities include alcohol use disorder, hypertension, GERD    Diet Order             Diet Heart Room service appropriate? Yes; Fluid consistency: Thin; Fluid restriction: 1200 mL Fluid  Diet effective now                            Consultants: Psychiatrist  Procedures: None    Medications:    ARIPiprazole  5 mg Oral QHS    busPIRone  10 mg Oral TID   Chlorhexidine Gluconate Cloth  6 each Topical Daily   clonazePAM  1 mg Oral BID   enoxaparin (LOVENOX) injection  40 mg Subcutaneous Q24H   lisinopril  30 mg Oral Daily   mouth rinse  15 mL Mouth Rinse BID   pantoprazole  40 mg Oral QAC breakfast   potassium chloride  20 mEq Oral BID   sodium chloride flush  10-40 mL Intracatheter Q12H   sucralfate  1 g Oral QID   zolpidem  5 mg Oral QHS   Continuous Infusions:  sodium chloride Stopped (07/10/21 1930)     Anti-infectives (From admission, onward)    Start     Dose/Rate Route Frequency Ordered Stop   07/01/21 2200  nirmatrelvir/ritonavir EUA (PAXLOVID) 3 tablet  Status:  Discontinued        3 tablet Oral 2 times daily 07/01/21 1900 07/01/21 1935   07/01/21 2200  molnupiravir EUA (LAGEVRIO) capsule 800 mg        4 capsule Oral 2 times daily 07/01/21 1935 07/06/21 2159              Family Communication/Anticipated D/C date and plan/Code Status   DVT prophylaxis: enoxaparin (LOVENOX) injection 40 mg Start: 07/01/21 2200 SCDs Start: 07/01/21 1108     Code Status: Full Code  Family Communication: None Disposition Plan: Plan to discharge to behavioral health unit   Status is: Inpatient Remains inpatient appropriate because: Awaiting placement to  behavioral health unit               Subjective:   Interval events noted.  He complains of restlessness.  He has a sitter at bedside  Objective:    Vitals:   07/15/21 1225 07/15/21 2201 07/16/21 0559 07/16/21 1221  BP: (!) 146/93 (!) 143/81 (!) 154/90 (!) 147/72  Pulse: 86 76 89 78  Resp: 20 16 16 16   Temp: 98.2 F (36.8 C) (!) 97.5 F (36.4 C) 97.9 F (36.6 C) 98.4 F (36.9 C)  TempSrc: Oral Oral Oral Oral  SpO2: 99% 99% 97% 97%  Weight:      Height:       No data found.   Intake/Output Summary (Last 24 hours) at 07/16/2021 1502 Last data filed at 07/15/2021 2204 Gross per 24 hour  Intake 190 ml  Output --  Net 190  ml   Filed Weights   07/01/21 0649 07/03/21 0845  Weight: 63.5 kg 63.9 kg    Exam:   GEN: NAD SKIN: Warm and dry EYES: EOMI ENT: MMM CV: RRR PULM: CTA B ABD: soft, ND, NT, +BS CNS: AAO x 3, non focal, tremors of right hand at rest EXT: No edema or tenderness PSYCH: Anxious, cooperative      Data Reviewed:   I have personally reviewed following labs and imaging studies:  Labs: Labs show the following:   Basic Metabolic Panel: Recent Labs  Lab 07/12/21 0500 07/13/21 0259 07/14/21 0600 07/14/21 0800 07/15/21 0327 07/16/21 0407  NA 130* 130* 131*  --  133* 134*  K 3.9 3.3* 3.0*  --  3.3* 3.5  CL 93* 94* 95*  --  98 99  CO2 29 27 28   --  28 29  GLUCOSE 86 91 92  --  89 85  BUN 6* 6* 5*  --  7* 6*  CREATININE 0.55* 0.50* 0.51*  --  0.50* 0.42*  CALCIUM 8.9 8.5* 8.6*  --  8.5* 8.5*  MG 2.0 2.1 1.9 1.8 2.1 2.1   GFR Estimated Creatinine Clearance: 83.2 mL/min (A) (by C-G formula based on SCr of 0.42 mg/dL (L)). Liver Function Tests: No results for input(s): AST, ALT, ALKPHOS, BILITOT, PROT, ALBUMIN in the last 168 hours. No results for input(s): LIPASE, AMYLASE in the last 168 hours. No results for input(s): AMMONIA in the last 168 hours. Coagulation profile No results for input(s): INR, PROTIME in the last 168 hours.  CBC: No results for input(s): WBC, NEUTROABS, HGB, HCT, MCV, PLT in the last 168 hours. Cardiac Enzymes: No results for input(s): CKTOTAL, CKMB, CKMBINDEX, TROPONINI in the last 168 hours. BNP (last 3 results) No results for input(s): PROBNP in the last 8760 hours. CBG: No results for input(s): GLUCAP in the last 168 hours. D-Dimer: No results for input(s): DDIMER in the last 72 hours. Hgb A1c: No results for input(s): HGBA1C in the last 72 hours. Lipid Profile: No results for input(s): CHOL, HDL, LDLCALC, TRIG, CHOLHDL, LDLDIRECT in the last 72 hours. Thyroid function studies: No results for input(s): TSH, T4TOTAL, T3FREE, THYROIDAB in  the last 72 hours.  Invalid input(s): FREET3 Anemia work up: No results for input(s): VITAMINB12, FOLATE, FERRITIN, TIBC, IRON, RETICCTPCT in the last 72 hours. Sepsis Labs: No results for input(s): PROCALCITON, WBC, LATICACIDVEN in the last 168 hours.  Microbiology No results found for this or any previous visit (from the past 240 hour(s)).  Procedures and diagnostic studies:  No results found.  LOS: 14 days   [place] Copywriter, advertising on [email or web address]. If 7PM-7AM, please contact night-coverage at [email or web address]     07/16/2021, 3:02 PM

## 2021-07-16 NOTE — Progress Notes (Signed)
°   07/16/21 1400  Mobility  Activity Ambulated with assistance in hallway  Level of Assistance Standby assist, set-up cues, supervision of patient - no hands on  Assistive Device Front wheel walker  Distance Ambulated (ft) 120 ft  Activity Response Tolerated fair  $Mobility charge 1 Mobility   Pt agreeable to mobilize this morning. He noted some back/spinal pain and leg pain, both 7/10. Pt still wanted to proceed with session. Ambulated about 186f in hall with RW, tolerated well. He did state he experienced some lightheadedness at the start, but stated it was subsiding as the session continued. Left pt in bed, call bell at side. RN/NT notified of session.   KMacksburgSpecialist Acute Rehab Services Office: [phone]

## 2021-07-17 DIAGNOSIS — T43202A Poisoning by unspecified antidepressants, intentional self-harm, initial encounter: Secondary | ICD-10-CM | POA: Diagnosis not present

## 2021-07-17 MED ORDER — POLYVINYL ALCOHOL 1.4 % OP SOLN
1.0000 [drp] | OPHTHALMIC | Status: DC | PRN
  Administered 2021-07-17: 1 [drp] via OPHTHALMIC
  Filled 2021-07-17: qty 15

## 2021-07-17 MED ORDER — HYDROCORTISONE (PERIANAL) 2.5 % EX CREA
TOPICAL_CREAM | Freq: Two times a day (BID) | CUTANEOUS | Status: AC
Start: 2021-07-17 — End: 2021-07-22
  Filled 2021-07-17: qty 28.35

## 2021-07-17 NOTE — Progress Notes (Signed)
Progress Note    Anthony Lamb  FGH:[id number] DOB: [birth year]  DOA: 07/01/2021 PCP: Sandi Mariscal, MD      Brief Narrative:    Medical records reviewed and are as summarized below:   Anthony Lamb is a 66 year old male with history of EtOH abuse, ankylosing spondylitis, anxiety, depression, history of colitis and chronic diarrhea, chronic headaches, chronic pain, HTN who presented to the hospital after intentional overdose of approximately 60 tablets of duloxetine.  There were multiple ER visits since September 2022 for anxiety, suicidal ideation, pain, and other issues.  He had been concerned with becoming homeless and endorsed wanting to "die because of this".  He was admitted to the hospital for further work-up as well as poison control evaluation and psychiatry assessment. He was incidentally found to be positive for COVID-19 as well.      Assessment/Plan:   Active Problems:   Essential hypertension   Major depression, recurrent (HCC)   Hyponatremia   Gastroesophageal reflux disease   Alcohol abuse   Suicide attempt ([place])   Allergic reaction   Body mass index is 20.21 kg/m.   Major depression, suicide attempt, overdose with Cymbalta: Continue antipsychotics.  Awaiting placement to behavioral health unit.  Follow-up with psychiatrist.  Hyponatremia: Improved.  He is off IV fluids.  Nephrologist has signed off.  Recent allergic reaction with rash on 07/08/2021: Resolved  Incidental COVID-19 infection: Completed treatment with molnupiravir on 07/06/2021 positive test on 07/01/2021.  Other comorbidities include alcohol use disorder, hypertension, GERD    Diet Order             Diet Heart Room service appropriate? Yes; Fluid consistency: Thin; Fluid restriction: 1200 mL Fluid  Diet effective now                            Consultants: Psychiatrist  Procedures: None    Medications:    ARIPiprazole  5 mg Oral QHS   busPIRone  10 mg Oral  TID   Chlorhexidine Gluconate Cloth  6 each Topical Daily   clonazePAM  1 mg Oral BID   enoxaparin (LOVENOX) injection  40 mg Subcutaneous Q24H   lisinopril  30 mg Oral Daily   mouth rinse  15 mL Mouth Rinse BID   pantoprazole  40 mg Oral QAC breakfast   potassium chloride  20 mEq Oral BID   sodium chloride flush  10-40 mL Intracatheter Q12H   sucralfate  1 g Oral QID   zolpidem  5 mg Oral QHS   Continuous Infusions:  sodium chloride Stopped (07/10/21 1930)     Anti-infectives (From admission, onward)    Start     Dose/Rate Route Frequency Ordered Stop   07/01/21 2200  nirmatrelvir/ritonavir EUA (PAXLOVID) 3 tablet  Status:  Discontinued        3 tablet Oral 2 times daily 07/01/21 1900 07/01/21 1935   07/01/21 2200  molnupiravir EUA (LAGEVRIO) capsule 800 mg        4 capsule Oral 2 times daily 07/01/21 1935 07/06/21 2159              Family Communication/Anticipated D/C date and plan/Code Status   DVT prophylaxis: enoxaparin (LOVENOX) injection 40 mg Start: 07/01/21 2200 SCDs Start: 07/01/21 1108     Code Status: Full Code  Family Communication: None Disposition Plan: Plan to discharge to behavioral health unit   Status is: Inpatient Remains inpatient appropriate because: Awaiting placement to  behavioral health unit               Subjective:   Interval events noted.  He said he feels "the same".  He has a Actuary at the bedside.  He has been ambulating with a walker   Objective:    Vitals:   07/16/21 1221 07/16/21 2114 07/17/21 0444 07/17/21 1216  BP: (!) 147/72 (!) 153/72 139/87 (!) 145/76  Pulse: 78 77 86 79  Resp: 16 20 18 18   Temp: 98.4 F (36.9 C) 98.3 F (36.8 C) (!) 97.5 F (36.4 C) 97.9 F (36.6 C)  TempSrc: Oral Oral Oral Oral  SpO2: 97% 100% 96% 98%  Weight:      Height:       No data found.   Intake/Output Summary (Last 24 hours) at 07/17/2021 1414 Last data filed at 07/17/2021 0811 Gross per 24 hour  Intake 120 ml   Output --  Net 120 ml   Filed Weights   07/01/21 0649 07/03/21 0845  Weight: 63.5 kg 63.9 kg    Exam:  GEN: NAD SKIN: Warm and dry EYES: EOMI ENT: MMM CV: RRR PULM: CTA B ABD: soft, ND, NT, +BS CNS: AAO x 3, non focal, tremors of right hand at rest EXT: No edema or tenderness        Data Reviewed:   I have personally reviewed following labs and imaging studies:  Labs: Labs show the following:   Basic Metabolic Panel: Recent Labs  Lab 07/12/21 0500 07/13/21 0259 07/14/21 0600 07/14/21 0800 07/15/21 0327 07/16/21 0407  NA 130* 130* 131*  --  133* 134*  K 3.9 3.3* 3.0*  --  3.3* 3.5  CL 93* 94* 95*  --  98 99  CO2 29 27 28   --  28 29  GLUCOSE 86 91 92  --  89 85  BUN 6* 6* 5*  --  7* 6*  CREATININE 0.55* 0.50* 0.51*  --  0.50* 0.42*  CALCIUM 8.9 8.5* 8.6*  --  8.5* 8.5*  MG 2.0 2.1 1.9 1.8 2.1 2.1   GFR Estimated Creatinine Clearance: 83.2 mL/min (A) (by C-G formula based on SCr of 0.42 mg/dL (L)). Liver Function Tests: No results for input(s): AST, ALT, ALKPHOS, BILITOT, PROT, ALBUMIN in the last 168 hours. No results for input(s): LIPASE, AMYLASE in the last 168 hours. No results for input(s): AMMONIA in the last 168 hours. Coagulation profile No results for input(s): INR, PROTIME in the last 168 hours.  CBC: No results for input(s): WBC, NEUTROABS, HGB, HCT, MCV, PLT in the last 168 hours. Cardiac Enzymes: No results for input(s): CKTOTAL, CKMB, CKMBINDEX, TROPONINI in the last 168 hours. BNP (last 3 results) No results for input(s): PROBNP in the last 8760 hours. CBG: No results for input(s): GLUCAP in the last 168 hours. D-Dimer: No results for input(s): DDIMER in the last 72 hours. Hgb A1c: No results for input(s): HGBA1C in the last 72 hours. Lipid Profile: No results for input(s): CHOL, HDL, LDLCALC, TRIG, CHOLHDL, LDLDIRECT in the last 72 hours. Thyroid function studies: No results for input(s): TSH, T4TOTAL, T3FREE, THYROIDAB in the  last 72 hours.  Invalid input(s): FREET3 Anemia work up: No results for input(s): VITAMINB12, FOLATE, FERRITIN, TIBC, IRON, RETICCTPCT in the last 72 hours. Sepsis Labs: No results for input(s): PROCALCITON, WBC, LATICACIDVEN in the last 168 hours.  Microbiology No results found for this or any previous visit (from the past 240 hour(s)).  Procedures and diagnostic studies:  No results found.             LOS: 15 days   [place] Copywriter, advertising on [email or web address]. If 7PM-7AM, please contact night-coverage at [email or web address]     07/17/2021, 2:14 PM

## 2021-07-17 NOTE — Plan of Care (Signed)
°  Problem: Coping: Goal: Ability to identify and develop effective coping behavior will improve Outcome: Progressing   Problem: Coping: Goal: Ability to interact with others will improve Outcome: Progressing   Problem: Clinical Measurements: Goal: Ability to maintain clinical measurements within normal limits will improve Outcome: Progressing   Problem: Pain Managment: Goal: General experience of comfort will improve Outcome: Progressing   Problem: Safety: Goal: Ability to remain free from injury will improve Outcome: Progressing

## 2021-07-17 NOTE — Progress Notes (Signed)
Patient still complaining of being unable to stop shaking or having tremors and feeling the need to be in motion and unable to sit down. Patient is very anxious even with PRN and scheduled medicine. Patient encourage to walk with sitter to keep mobility and independence. Patient also complains of rectal pain. MD made aware. Will continue to reassure patient and provide emotional support.

## 2021-07-17 NOTE — Progress Notes (Signed)
Pts sitter came out to tell staff that pt is very restless & anxious cant sit still will not refocus on anything other than his meds and his eyes being red & watery This nurse went in to give PRN meds that were ordered

## 2021-07-18 DIAGNOSIS — T43202A Poisoning by unspecified antidepressants, intentional self-harm, initial encounter: Secondary | ICD-10-CM | POA: Diagnosis not present

## 2021-07-18 DIAGNOSIS — T50902A Poisoning by unspecified drugs, medicaments and biological substances, intentional self-harm, initial encounter: Secondary | ICD-10-CM | POA: Diagnosis not present

## 2021-07-18 LAB — CBC WITH DIFFERENTIAL/PLATELET
Abs Immature Granulocytes: 0.02 10*3/uL (ref 0.00–0.07)
Basophils Absolute: 0.1 10*3/uL (ref 0.0–0.1)
Basophils Relative: 1 %
Eosinophils Absolute: 0.1 10*3/uL (ref 0.0–0.5)
Eosinophils Relative: 1 %
HCT: 35.5 % — ABNORMAL LOW (ref 39.0–52.0)
Hemoglobin: 11.9 g/dL — ABNORMAL LOW (ref 13.0–17.0)
Immature Granulocytes: 0 %
Lymphocytes Relative: 32 %
Lymphs Abs: 2.7 10*3/uL (ref 0.7–4.0)
MCH: 31.9 pg (ref 26.0–34.0)
MCHC: 33.5 g/dL (ref 30.0–36.0)
MCV: 95.2 fL (ref 80.0–100.0)
Monocytes Absolute: 1 10*3/uL (ref 0.1–1.0)
Monocytes Relative: 12 %
Neutro Abs: 4.5 10*3/uL (ref 1.7–7.7)
Neutrophils Relative %: 54 %
Platelets: 331 10*3/uL (ref 150–400)
RBC: 3.73 MIL/uL — ABNORMAL LOW (ref 4.22–5.81)
RDW: 13.2 % (ref 11.5–15.5)
WBC: 8.4 10*3/uL (ref 4.0–10.5)
nRBC: 0 % (ref 0.0–0.2)

## 2021-07-18 LAB — BASIC METABOLIC PANEL
Anion gap: 5 (ref 5–15)
BUN: 10 mg/dL (ref 8–23)
CO2: 30 mmol/L (ref 22–32)
Calcium: 9.1 mg/dL (ref 8.9–10.3)
Chloride: 97 mmol/L — ABNORMAL LOW (ref 98–111)
Creatinine, Ser: 0.53 mg/dL — ABNORMAL LOW (ref 0.61–1.24)
GFR, Estimated: >60 mL/min (ref >60)
Glucose, Bld: 87 mg/dL (ref 70–99)
Potassium: 4.2 mmol/L (ref 3.5–5.1)
Sodium: 134 mmol/L — ABNORMAL LOW (ref 135–145)

## 2021-07-18 LAB — CK: Total CK: 77 U/L (ref 49–397)

## 2021-07-18 NOTE — TOC Progression Note (Signed)
Transition of Care [facility]) - Progression Note    Patient Details  Name: Anthony Lamb MRN: [id number] Date of Birth: [birth year]  Transition of Care [facility]) CM/SW Contact  Ross Ludwig, [place] Phone Number: 07/18/2021, 7:53 PM  Clinical Narrative:    IVC renewed on 10th of February, valid till 17th of February.  Patient still does not have inpatient geripsych bed available.  CSW discussed with Lead Juluis Rainier, she said [facility] geripysh unable to accept patient.  [facility] said they could not accept patient, per [facility] intake coordinator, patient has been with them in the past, and has a diagnosis of dementia, they are unable to accept patient.  CSW to continue to work on placement along with difficult to place team.  Updated attending physician, charge nurse, and bedside nurse.        Expected Discharge Plan and Services  Geripsych                                               Social Determinants of Health (SDOH) Interventions    Readmission Risk Interventions No flowsheet data found.

## 2021-07-18 NOTE — Consult Note (Signed)
[facility] Psychiatry Consult   Reason for Consult:  Suicide attempt by overdose on cymbalta Referring Physician:  Dr. Olevia Bowens Patient Identification: Anthony Lamb MRN:  [id number] Principal Diagnosis: Antidepressant overdose Diagnosis:  Active Problems:   Essential hypertension   Major depression, recurrent ([place])   Hyponatremia   Gastroesophageal reflux disease   Alcohol abuse   Suicide attempt ([place])   Allergic reaction   Total Time spent with patient: 20 minutes  Subjective:   Anthony Lamb is a 66 y.o. male patient admitted with suicide attempt by overdose.  Patient presented after overdosing on (60) 60 mg tablets of Cymbalta, he subsequently developed serotonin syndrome and severe hyponatremia that resulted in admission to intensive care for hyponatremia protocol.    Patient is seen and assessed by this nurse practitioner.  Patient is seen and reassessed by this nurse practitioner.  Case discussed with Dr. Lovette Cliche.  No current changes in patient's clinical presentation.  He continues to endorse increased levels of anxiety, that do not appear to be responding to current psychotropic medication.  He continues to request additional as needed medications, despite current medication regimen.  He continues to report his anxiety does not appear to be responsive to treatment, and Ativan is the only thing that works for him.  He is currently taking Abilify 5 mg p.o. nightly, hydroxyzine 25 mg p.o. every 4 hours as needed, BuSpar 10 mg p.o. 3 times daily.  He is tolerating well at this time, denies any side effects and or adverse reactions.    He is observed lying in bed, brightens upon approach.   Patient continues to perseverate about his anxiety medication and sleep.  He continues to endorse passive suicidal ideations, following lethal suicide attempt on duloxetine after being declined benzodiazepines while in the emergency room.  Patient is now medically stable discharge to  inpatient general psych hospital for crisis stabilization, medication management, and behavior modification.   HPI:  Anthony Lamb is a 66 y.o. male with a past medical history significant for hypertension, GERD, peptic ulcer disease, ankylosing spondylitis, irritable bowel syndrome, chronic pain, anxiety, and depression who presents for suicide attempt by overdose.  According to EMS and patient, at 545 this morning, patient tried to overdose by taking 60 duloxetine tablets.  EMS thought that they were 60 mg tablets by report.  Patient reports she is feeling some waxing waning nausea, lightheadedness, and feels like he has no bowel movement.  He feels very dehydrated and tired.  He also is feeling very anxious and agitated.  He still says he wants to kill himself.  He reports that he was discharged from the emergency department yesterday at [facility] and he still says he is going to try to kill himself he leaves again today.  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 Nancy Fetter at [facility].  Past Medical History:  Past Medical History:  Diagnosis Date   Alcohol abuse, in remission    Anal fissure    Anemia    Ankylosing spondylitis (HCC)    Anxiety    Arthritis    Chronic diarrhea    Chronic headaches    Chronic pain    Colitis    Colon polyps    Depression    Esophagitis  Gastric polyps    hyperplastic and fundic gland   Gastritis    GERD (gastroesophageal reflux disease)    Hypertension    IBS (irritable bowel syndrome)    Poor dentition    SIADH (syndrome of inappropriate ADH production) ([place])     Past Surgical History:  Procedure Laterality Date   BIOPSY  11/26/2018    Procedure: BIOPSY;  Surgeon: Gatha Mayer, MD;  Location: [facility];  Service: Endoscopy;;   COLONOSCOPY W/ BIOPSIES     COLONOSCOPY WITH PROPOFOL N/A 11/26/2018   Procedure: COLONOSCOPY WITH PROPOFOL;  Surgeon: Gatha Mayer, MD;  Location: [facility];  Service: Endoscopy;  Laterality: N/A;   ESOPHAGOGASTRODUODENOSCOPY     ESOPHAGOGASTRODUODENOSCOPY (EGD) WITH PROPOFOL N/A 11/26/2018   Procedure: ESOPHAGOGASTRODUODENOSCOPY (EGD) WITH PROPOFOL;  Surgeon: Gatha Mayer, MD;  Location: [facility];  Service: Endoscopy;  Laterality: N/A;   HEMOSTASIS CLIP PLACEMENT  11/26/2018   Procedure: HEMOSTASIS CLIP PLACEMENT;  Surgeon: Gatha Mayer, MD;  Location: [facility];  Service: Endoscopy;;   HERNIA REPAIR Bilateral    HOT HEMOSTASIS N/A 11/26/2018   Procedure: HOT HEMOSTASIS (ARGON PLASMA COAGULATION/BICAP);  Surgeon: Gatha Mayer, MD;  Location: [facility];  Service: Endoscopy;  Laterality: N/A;   OPEN REDUCTION INTERNAL FIXATION (ORIF) DISTAL RADIAL FRACTURE Right 02/11/2018   Procedure: OPEN REDUCTION INTERNAL FIXATION (ORIF) DISTAL RADIAL FRACTURE;  Surgeon: Milly Jakob, MD;  Location: [facility];  Service: Orthopedics;  Laterality: Right;   POLYPECTOMY  11/26/2018   Procedure: POLYPECTOMY;  Surgeon: Gatha Mayer, MD;  Location: [facility];  Service: Endoscopy;;   TONSILLECTOMY     Family History:  Family History  Problem Relation Age of Onset   Anxiety disorder Mother    Congestive Heart Failure Mother    Crohn's disease Mother    Colon cancer Mother 20   Arthritis Father    High blood pressure Father    Crohn's disease Father    Family Psychiatric  History: Denies Social History:  Social History   Substance and Sexual Activity  Alcohol Use Not Currently     Social History   Substance and Sexual Activity  Drug Use Not Currently   Types: Marijuana    Social History   Socioeconomic History   Marital status: Single    Spouse name: Not on file   Number  of children: 0   Years of education: Not on file   Highest education level: Not on file  Occupational History   Not on file  Tobacco Use   Smoking status: Former    Types: Cigarettes    Quit date: 2014    Years since quitting: 9.1   Smokeless tobacco: Never  Vaping Use   Vaping Use: Never used  Substance and Sexual Activity   Alcohol use: Not Currently   Drug use: Not Currently    Types: Marijuana   Sexual activity: Not Currently  Other Topics Concern   Not on file  Social History Narrative   HSG. Long - term disability - unable to work. Lived with his mother in her house - she died 2023/04/21 -    Lives in apartment   Has case worker   Social Determinants of Radio broadcast assistant Strain: Not on file  Food Insecurity: Not on file  Transportation Needs: Not on file  Physical Activity: Not on file  Stress: Not on file  Social Connections: Not on file   Additional Social History:    Allergies:  Allergies  Allergen Reactions   Nsaids Other (See Comments)    GI upset- history of peptic ulcers!!   Diphenhydramine Hcl Other (See Comments)    Restlessness   Flexeril [Cyclobenzaprine] Other (See Comments)    Restlessness   Fluoxetine Other (See Comments)    Made the patient feel "worse than before" (treatment)   Hctz [Hydrochlorothiazide] Other (See Comments)    Caused to lose sodium when taking with Lisinopril   Rexulti [Brexpiprazole] Other (See Comments)    "Made me not feel right- restless"   Seroquel [Quetiapine] Other (See Comments)    Restless legs and makes the patient sweat- also does not help patient's insomnia. Pt takes this at home in 2023.   Gabapentin Diarrhea   Hydroxyzine Other (See Comments)    Restlessness    Labs:  No results found for this or any previous visit (from the past 48 hour(s)).   Current Facility-Administered Medications  Medication Dose Route Frequency Provider Last Rate Last Admin   0.9 %  sodium chloride infusion    Intravenous PRN Dwyane Dee, MD 10 mL/hr at 07/18/21 0551 New Bag at 07/18/21 0551   acetaminophen (TYLENOL) tablet 650 mg  650 mg Oral Q4H PRN Dwyane Dee, MD   650 mg at 07/17/21 2028   alum & mag hydroxide-simeth (MAALOX/MYLANTA) 200-200-20 MG/5ML suspension 30 mL  30 mL Oral Q4H PRN Dwyane Dee, MD   30 mL at 07/15/21 1122   ARIPiprazole (ABILIFY) tablet 5 mg  5 mg Oral QHS Suella Broad, FNP   5 mg at 07/17/21 2057   busPIRone (BUSPAR) tablet 10 mg  10 mg Oral TID Dwyane Dee, MD   10 mg at 07/18/21 0845   Chlorhexidine Gluconate Cloth 2 % PADS 6 each  6 each Topical Daily Dwyane Dee, MD   6 each at 07/18/21 1014   clonazePAM (KLONOPIN) tablet 1 mg  1 mg Oral BID Suella Broad, FNP   1 mg at 07/18/21 0845   diphenhydrAMINE-zinc acetate (BENADRYL) 2-0.1 % cream   Topical TID PRN Dwyane Dee, MD   Given at 07/15/21 2106   enoxaparin (LOVENOX) injection 40 mg  40 mg Subcutaneous Q24H Dwyane Dee, MD   40 mg at 07/17/21 2057   hydrALAZINE (APRESOLINE) tablet 25 mg  25 mg Oral Q4H PRN Dwyane Dee, MD   25 mg at 07/13/21 1701   hydrocortisone (ANUSOL-HC) 2.5 % rectal cream   Rectal BID Jennye Boroughs, MD   Given at 07/17/21 1701   hydrOXYzine (ATARAX) tablet 25 mg  25 mg Oral Q4H PRN Dwyane Dee, MD   25 mg at 07/17/21 2027   labetalol (NORMODYNE) injection 10 mg  10 mg Intravenous Q4H PRN Dwyane Dee, MD   10 mg at 07/13/21 1209   lisinopril (ZESTRIL) tablet 30 mg  30 mg Oral Daily Dwyane Dee, MD   30 mg at 07/18/21 0844   loperamide (IMODIUM) capsule 2 mg  2 mg Oral PRN Phillips Grout, MD   2 mg at 07/15/21 1734   MEDLINE mouth rinse  15 mL Mouth Rinse BID Dwyane Dee, MD   15 mL at 07/17/21 2057   oxyCODONE (Oxy IR/ROXICODONE) immediate release tablet 5 mg  5 mg Oral Q8H PRN Dwyane Dee, MD   5 mg at 07/18/21 0551   pantoprazole (PROTONIX) EC tablet 40 mg  40 mg Oral QAC breakfast Dwyane Dee, MD   40 mg at 07/18/21 0845   phenol  (CHLORASEPTIC) mouth spray 1 spray  1 spray Mouth/Throat PRN Dwyane Dee, MD   1 spray at 07/11/21 2054   polyethylene glycol (MIRALAX / GLYCOLAX) packet 17 g  17 g Oral Daily PRN Dwyane Dee, MD   17 g at 07/06/21 1941   polyvinyl alcohol (LIQUIFILM TEARS) 1.4 % ophthalmic solution 1 drop  1 drop Both Eyes PRN Jennye Boroughs, MD   1 drop at 07/17/21 2106   potassium chloride SA (KLOR-CON M) CR tablet 20 mEq  20 mEq Oral BID Phillips Grout, MD   20 mEq at 07/18/21 0844   prochlorperazine (COMPAZINE) injection 5 mg  5 mg Intravenous Q6H PRN Dwyane Dee, MD   5 mg at 07/15/21 0856   sodium chloride (OCEAN) 0.65 % nasal spray 1 spray  1 spray Each Nare PRN Dwyane Dee, MD   1 spray at 07/17/21 0429   sodium chloride flush (NS) 0.9 % injection 10-40 mL  10-40 mL Intracatheter Q12H Dwyane Dee, MD   10 mL at 07/17/21 2057   sodium chloride flush (NS) 0.9 % injection 10-40 mL  10-40 mL Intracatheter PRN Dwyane Dee, MD       sucralfate (CARAFATE) tablet 1 g  1 g Oral QID Dwyane Dee, MD   1 g at 07/18/21 0844   zolpidem (AMBIEN) tablet 5 mg  5 mg Oral Standley Brooking, MD   5 mg at 07/17/21 2057    Musculoskeletal: Strength & Muscle Tone: within normal limits Gait & Station: normal Patient leans: N/A     Psychiatric Specialty Exam:  Presentation  General Appearance: Appropriate for Environment; Disheveled  Eye Contact:Fair  Speech:Clear and Coherent; Normal Rate  Speech Volume:Normal  Handedness:Right   Mood and Affect  Mood:Anxious; Depressed  Affect:Appropriate; Congruent   Thought Process  Thought Processes:Coherent; Linear  Descriptions of Associations:Intact  Orientation:Full (Time, Place and Person)  Thought Content:Logical  History of Schizophrenia/Schizoaffective disorder:No  Duration of Psychotic Symptoms:Greater than six months  Hallucinations:No data recorded  Ideas of Reference:Paranoia (believes he has cancer and has been evicted  from his apartment.)  Suicidal Thoughts:No data recorded  Homicidal Thoughts:No data recorded   Sensorium  Memory:Immediate Fair; Recent Fair; Remote Fair  Judgment:Fair  Insight:Poor   Executive Functions  Concentration:Fair  Attention Span:Fair  [place]   Psychomotor Activity  Psychomotor Activity:No data recorded   Assets  Assets:Communication Skills; Financial Resources/Insurance; Housing; Social Support; Leisure Time; Resilience   Sleep  Sleep:No data recorded   Physical Exam: Physical Exam Vitals and nursing note reviewed.  Constitutional:      Appearance: Normal appearance. He is normal weight.  HENT:     Head: Normocephalic.  Eyes:     General: Lids are normal.  Neurological:     General: No focal deficit present.     Mental Status: He is alert and oriented to person, place, and time. Mental status is at baseline.     Motor: Tremor present. No atrophy.  Psychiatric:        Attention and Perception: Attention and perception normal.        Mood and Affect: Mood is anxious.        Speech: Speech normal.        Behavior: Behavior normal. Behavior is cooperative.        Thought Content: Thought content normal.        Cognition and Memory: Cognition and memory normal.        Judgment: Judgment normal.   Review of Systems  Neurological:  Positive for tremors, focal weakness and weakness.  Psychiatric/Behavioral:  Positive for depression, substance abuse (alcohol use, last drank 1 month ago) and suicidal ideas (suicide attempt). The patient is nervous/anxious and has insomnia (takes [place]).   All other systems reviewed and are negative. Blood pressure 132/80, pulse 93, temperature 98 F (36.7 C), temperature source Oral, resp. rate 18, height 5' 10"  (1.778 m), weight 63.9 kg, SpO2 97 %. Body mass index is 20.21 kg/m.   Treatment Plan Summary: Daily contact with patient to assess and evaluate symptoms  and progress in treatment, Medication management, and Plan     Recommended for inpatient psychiatric treatment. Patient unable to contract for safety.  -Patient is to remain under involuntary commitment at this time, as he continues to be a danger to himself and unable to contract for safety.   -Will continue Abilify 5 mg mg p.o. nightly to target psychosis, depressive symptoms, anxiety, and suicidal ideations.  Continue BuSpar 10 mg p.o. 3 times daily. -Will continue Klonopin 1 mg p.o. twice daily for anxiety.  Will continue with Klonopin taper starting tomorrow.  Klonopin 0.5 mg p.o. daily every morning; and Klonopin 1 mg p.o. nightly. Will discontinue Haldol as needed for agitation, as it appears patient has been receiving this medication for management of anxiety.  Patient is currently receiving Abilify as an antipsychotic and administering 2 antipsychotics Will increase patient's risks for EPS.  Will obtain repeat labs, and discontinue Haldol at this time. -He is medically stable to discharge to inpatient psychiatric facility.   Disposition: Recommend psychiatric Inpatient admission when medically cleared. IVC  Suella Broad, FNP 07/18/2021 11:24 AM

## 2021-07-18 NOTE — Progress Notes (Signed)
Progress Note    Anthony Lamb  QAS:[id number] DOB: 10/29/1955  DOA: 07/01/2021 PCP: Sandi Mariscal, MD      Brief Narrative:    Medical records reviewed and are as summarized below:   Anthony Lamb is a 66 year old male with history of EtOH abuse, ankylosing spondylitis, anxiety, depression, history of colitis and chronic diarrhea, chronic headaches, chronic pain, HTN who presented to the hospital after intentional overdose of approximately 60 tablets of duloxetine.  There were multiple ER visits since September 2022 for anxiety, suicidal ideation, pain, and other issues.  He had been concerned with becoming homeless and endorsed wanting to "die because of this".  He was admitted to the hospital for further work-up as well as poison control evaluation and psychiatry assessment. He was incidentally found to be positive for COVID-19 as well.      Assessment/Plan:   Active Problems:   Essential hypertension   Major depression, recurrent (HCC)   Hyponatremia   Gastroesophageal reflux disease   Alcohol abuse   Suicide attempt ([place])   Allergic reaction   Body mass index is 20.21 kg/m.   Major depression, suicide attempt, overdose with Cymbalta: Continue antipsychotics.  Awaiting placement to behavioral health unit.   Follow-up with psychiatrist.  Hyponatremia: Improved.  He is off IV fluids.  Nephrologist has signed off.  Recent allergic reaction with rash on 07/08/2021: Resolved  Incidental COVID-19 infection: Completed treatment with molnupiravir on 07/06/2021 positive test on 07/01/2021.  Other comorbidities include alcohol use disorder, hypertension, GERD    Diet Order             Diet Heart Room service appropriate? Yes; Fluid consistency: Thin; Fluid restriction: 1200 mL Fluid  Diet effective now                            Consultants: Psychiatrist  Procedures: None    Medications:    ARIPiprazole  5 mg Oral QHS   busPIRone  10 mg Oral  TID   Chlorhexidine Gluconate Cloth  6 each Topical Daily   clonazePAM  1 mg Oral BID   enoxaparin (LOVENOX) injection  40 mg Subcutaneous Q24H   hydrocortisone   Rectal BID   lisinopril  30 mg Oral Daily   mouth rinse  15 mL Mouth Rinse BID   pantoprazole  40 mg Oral QAC breakfast   potassium chloride  20 mEq Oral BID   sodium chloride flush  10-40 mL Intracatheter Q12H   sucralfate  1 g Oral QID   zolpidem  5 mg Oral QHS   Continuous Infusions:  sodium chloride 10 mL/hr at 07/18/21 0551     Anti-infectives (From admission, onward)    Start     Dose/Rate Route Frequency Ordered Stop   07/01/21 2200  nirmatrelvir/ritonavir EUA (PAXLOVID) 3 tablet  Status:  Discontinued        3 tablet Oral 2 times daily 07/01/21 1900 07/01/21 1935   07/01/21 2200  molnupiravir EUA (LAGEVRIO) capsule 800 mg        4 capsule Oral 2 times daily 07/01/21 1935 07/06/21 2159              Family Communication/Anticipated D/C date and plan/Code Status   DVT prophylaxis: enoxaparin (LOVENOX) injection 40 mg Start: 07/01/21 2200 SCDs Start: 07/01/21 1108     Code Status: Full Code  Family Communication: None Disposition Plan: Plan to discharge to behavioral health unit  Status is: Inpatient Remains inpatient appropriate because: Awaiting placement to behavioral health unit               Subjective:   She complains of sleepless nights and anxiety.  He wants to take Ambien to help him sleep at night.  He said he takes this at home on a regular basis.  Chart review showed that he is getting Ambien at night.  Objective:    Vitals:   07/17/21 1216 07/17/21 1815 07/18/21 0537 07/18/21 1311  BP: (!) 145/76 (!) 159/78 132/80 135/74  Pulse: 79 84 93 84  Resp: 18 16 18 20   Temp: 97.9 F (36.6 C) 97.9 F (36.6 C) 98 F (36.7 C) 98.1 F (36.7 C)  TempSrc: Oral Oral Oral Oral  SpO2: 98% 98% 97% 98%  Weight:      Height:       No data found.   Intake/Output Summary  (Last 24 hours) at 07/18/2021 1350 Last data filed at 07/17/2021 1838 Gross per 24 hour  Intake 360 ml  Output --  Net 360 ml   Filed Weights   07/01/21 0649 07/03/21 0845  Weight: 63.5 kg 63.9 kg    Exam:  GEN: NAD SKIN: Warm and dry EYES: EOMI ENT: MMM CV: RRR PULM: CTA B ABD: soft, ND, NT, +BS CNS: AAO x 3, non focal, tremors of right upper extremity EXT: No edema or tenderness         Data Reviewed:   I have personally reviewed following labs and imaging studies:  Labs: Labs show the following:   Basic Metabolic Panel: Recent Labs  Lab 07/12/21 0500 07/13/21 0259 07/14/21 0600 07/14/21 0800 07/15/21 0327 07/16/21 0407  NA 130* 130* 131*  --  133* 134*  K 3.9 3.3* 3.0*  --  3.3* 3.5  CL 93* 94* 95*  --  98 99  CO2 29 27 28   --  28 29  GLUCOSE 86 91 92  --  89 85  BUN 6* 6* 5*  --  7* 6*  CREATININE 0.55* 0.50* 0.51*  --  0.50* 0.42*  CALCIUM 8.9 8.5* 8.6*  --  8.5* 8.5*  MG 2.0 2.1 1.9 1.8 2.1 2.1   GFR Estimated Creatinine Clearance: 83.2 mL/min (A) (by C-G formula based on SCr of 0.42 mg/dL (L)). Liver Function Tests: No results for input(s): AST, ALT, ALKPHOS, BILITOT, PROT, ALBUMIN in the last 168 hours. No results for input(s): LIPASE, AMYLASE in the last 168 hours. No results for input(s): AMMONIA in the last 168 hours. Coagulation profile No results for input(s): INR, PROTIME in the last 168 hours.  CBC: No results for input(s): WBC, NEUTROABS, HGB, HCT, MCV, PLT in the last 168 hours. Cardiac Enzymes: No results for input(s): CKTOTAL, CKMB, CKMBINDEX, TROPONINI in the last 168 hours. BNP (last 3 results) No results for input(s): PROBNP in the last 8760 hours. CBG: No results for input(s): GLUCAP in the last 168 hours. D-Dimer: No results for input(s): DDIMER in the last 72 hours. Hgb A1c: No results for input(s): HGBA1C in the last 72 hours. Lipid Profile: No results for input(s): CHOL, HDL, LDLCALC, TRIG, CHOLHDL, LDLDIRECT in  the last 72 hours. Thyroid function studies: No results for input(s): TSH, T4TOTAL, T3FREE, THYROIDAB in the last 72 hours.  Invalid input(s): FREET3 Anemia work up: No results for input(s): VITAMINB12, FOLATE, FERRITIN, TIBC, IRON, RETICCTPCT in the last 72 hours. Sepsis Labs: No results for input(s): PROCALCITON, WBC, LATICACIDVEN in the last 168  hours.  Microbiology No results found for this or any previous visit (from the past 240 hour(s)).  Procedures and diagnostic studies:  No results found.             LOS: 16 days   [employer], advertising on [email or web address]. If 7PM-7AM, please contact night-coverage at [email or web address]     07/18/2021, 1:50 PM

## 2021-07-18 NOTE — Progress Notes (Signed)
Physical Therapy Treatment Patient Details Name: Anthony Lamb MRN: [id number] DOB: Apr 07, 1956 Today's Date: 07/18/2021   History of Present Illness 66 year old male with history of EtOH abuse, ankylosing spondylitis, anxiety, depression, history of colitis and chronic diarrhea, chronic headaches, chronic pain, hypertension, comes into the hospital after taking 60 tablets of duloxetine.  He has had multiple ER visits since September for anxiety, suicidal ideation, pain and other issues    PT Comments    Pt making steady progress with mobility and ambulated ~100' with RW. Pt requires min guard for safety with transfers and gait and cues to focus on present moment and tasks. Pt perseverates on fear of having to be still and not move around. EOS completed exercises for LE strengthening and sitting EOB at EOS with sitter and MD in room. Acute PT will continue to progress mobility towards goals of independent for safe discharge to behavioral/psych setting.     Recommendations for follow up therapy are one component of a multi-disciplinary discharge planning process, led by the attending physician.  Recommendations may be updated based on patient status, additional functional criteria and insurance authorization.  Follow Up Recommendations   (geripsych, [facility])     Assistance Recommended at Discharge Frequent or constant Supervision/Assistance  Patient can return home with the following Assistance with cooking/housework;Direct supervision/assist for medications management;Assist for transportation;Help with stairs or ramp for entrance;A little help with walking and/or transfers   Equipment Recommendations  Rolling walker (2 wheels)    Recommendations for Other Services       Precautions / Restrictions Precautions Precautions: Fall Precaution Comments: orthostatic Restrictions Weight Bearing Restrictions: No     Mobility  Bed Mobility Overal bed mobility: Modified Independent              General bed mobility comments: extra time, HOB elevated.    Transfers Overall transfer level: Needs assistance Equipment used: Rolling walker (2 wheels) Transfers: Sit to/from Stand Sit to Stand: Min guard           General transfer comment: close guard to rise from EOB, pt c/o weakness and not being able to rise but able to initiate and stand with encouragement.    Ambulation/Gait Ambulation/Gait assistance: Min guard Gait Distance (Feet): 100 Feet Assistive device: Rolling walker (2 wheels) Gait Pattern/deviations: Step-through pattern, Decreased stride length, Shuffle, Trunk flexed Gait velocity: decr     General Gait Details: pt steady overall with low shuffled steps. cadence very slow. no overt LOB noted.   Stairs             Wheelchair Mobility    Modified Rankin (Stroke Patients Only)       Balance Overall balance assessment: Needs assistance Sitting-balance support: Feet supported Sitting balance-Leahy Scale: Fair     Standing balance support: During functional activity, Reliant on assistive device for balance, Bilateral upper extremity supported Standing balance-Leahy Scale: Fair                              Cognition Arousal/Alertness: Awake/alert Behavior During Therapy: Flat affect, Restless, Impulsive Overall Cognitive Status: No family/caregiver present to determine baseline cognitive functioning Area of Impairment: Orientation, Safety/judgement                               General Comments: pt perseverating on not being able to stay still and having to be in bed and not move  around. Pt with significant anxiety.        Exercises Other Exercises Other Exercises: 10x sit<>stand no UE use from EOB.    General Comments        Pertinent Vitals/Pain Pain Assessment Pain Assessment: No/denies pain Pain Intervention(s): Monitored during session    Home Living                           Prior Function            PT Goals (current goals can now be found in the care plan section) Acute Rehab PT Goals PT Goal Formulation: Patient unable to participate in goal setting Time For Goal Achievement: 07/19/21 Potential to Achieve Goals: Fair Progress towards PT goals: Progressing toward goals    Frequency    Min 2X/week      PT Plan Current plan remains appropriate    Co-evaluation              AM-PAC PT "6 Clicks" Mobility   Outcome Measure  Help needed turning from your back to your side while in a flat bed without using bedrails?: None Help needed moving from lying on your back to sitting on the side of a flat bed without using bedrails?: A Little Help needed moving to and from a bed to a chair (including a wheelchair)?: A Little Help needed standing up from a chair using your arms (e.g., wheelchair or bedside chair)?: A Little Help needed to walk in hospital room?: A Little Help needed climbing 3-5 steps with a railing? : A Little 6 Click Score: 19    End of Session Equipment Utilized During Treatment: Gait belt Activity Tolerance: Patient tolerated treatment well Patient left: in bed;with call bell/phone within reach;with nursing/sitter in room Nurse Communication: Mobility status PT Visit Diagnosis: Muscle weakness (generalized) (M62.81);Unsteadiness on feet (R26.81)     Time: 3570-1779 PT Time Calculation (min) (ACUTE ONLY): 17 min  Charges:  $Therapeutic Exercise: 8-22 mins                     Anthony Lamb, DPT Acute Rehabilitation Services Office [phone] Pager [phone]    Anthony Lamb 07/18/2021, 12:43 PM

## 2021-07-19 DIAGNOSIS — T43204A Poisoning by unspecified antidepressants, undetermined, initial encounter: Secondary | ICD-10-CM

## 2021-07-19 MED ORDER — CLONAZEPAM 1 MG PO TABS
1.0000 mg | ORAL_TABLET | Freq: Every day | ORAL | Status: DC
  Administered 2021-07-20 – 2021-08-10 (×22): 1 mg via ORAL
  Filled 2021-07-19 (×22): qty 1

## 2021-07-19 MED ORDER — NAPHAZOLINE-PHENIRAMINE 0.025-0.3 % OP SOLN
1.0000 [drp] | Freq: Four times a day (QID) | OPHTHALMIC | Status: DC | PRN
  Administered 2021-07-19 – 2021-08-22 (×2): 1 [drp] via OPHTHALMIC
  Filled 2021-07-19: qty 15

## 2021-07-19 MED ORDER — CLONAZEPAM 0.5 MG PO TABS
0.5000 mg | ORAL_TABLET | Freq: Every day | ORAL | Status: DC
  Administered 2021-07-20 – 2021-08-11 (×23): 0.5 mg via ORAL
  Filled 2021-07-19 (×24): qty 1

## 2021-07-19 MED ORDER — OXYCODONE HCL 5 MG PO TABS
5.0000 mg | ORAL_TABLET | Freq: Two times a day (BID) | ORAL | Status: DC | PRN
  Administered 2021-07-19 – 2021-08-22 (×67): 5 mg via ORAL
  Filled 2021-07-19 (×70): qty 1

## 2021-07-19 MED ORDER — LORATADINE 10 MG PO TABS
10.0000 mg | ORAL_TABLET | Freq: Every day | ORAL | Status: DC
  Administered 2021-07-19 – 2021-08-22 (×27): 10 mg via ORAL
  Filled 2021-07-19 (×36): qty 1

## 2021-07-19 MED ORDER — BUSPIRONE HCL 5 MG PO TABS
15.0000 mg | ORAL_TABLET | Freq: Three times a day (TID) | ORAL | Status: DC
  Administered 2021-07-20 – 2021-07-21 (×4): 15 mg via ORAL
  Filled 2021-07-19 (×4): qty 1

## 2021-07-19 NOTE — Progress Notes (Signed)
PROGRESS NOTE    Anthony Lamb  AJO:[id number] DOB: 01/02/1956 DOA: 07/01/2021 PCP: Anthony Mariscal, MD   Brief Narrative:   Anthony Lamb is a 66 year old male with history of EtOH abuse, ankylosing spondylitis, anxiety, depression, history of colitis and chronic diarrhea, chronic headaches, chronic pain, HTN who presented to the hospital after intentional overdose of approximately 60 tablets of duloxetine.  There were multiple ER visits since September 2022 for anxiety, suicidal ideation, pain, and other issues.  He had been concerned with becoming homeless and endorsed wanting to "die because of this".  He was admitted to the hospital for further work-up as well as poison control evaluation and psychiatry assessment. He was incidentally found to be positive for COVID-19 as well.  He is currently awaiting behavioral health facility placement.  Assessment & Plan:   Active Problems:   Essential hypertension   Major depression, recurrent (HCC)   Hyponatremia   Gastroesophageal reflux disease   Alcohol abuse   Suicide attempt ([place])   Allergic reaction   Body mass index is 20.21 kg/m.     Major depression, suicide attempt, overdose with Cymbalta: Continue antipsychotics.  Awaiting placement to behavioral health unit.   Follow-up with psychiatrist-adjusting medications as appropriate.   Hyponatremia: Improved.  He is off IV fluids.  Nephrologist has signed off.  Follow BMP in a.m.   Recent allergic reaction with rash on 07/08/2021: Resolved   Incidental COVID-19 infection: Completed treatment with molnupiravir on 07/06/2021 positive test on 07/01/2021.   Other comorbidities include alcohol use disorder, hypertension, GERD   DVT prophylaxis: Lovenox Code Status: Full Family Communication: None at bedside Disposition Plan:  Status is: Inpatient Remains inpatient appropriate because: Behavioral issues awaiting placement.    Consultants:  Psychiatry  Procedures:  See  below  Antimicrobials:  Anti-infectives (From admission, onward)    Start     Dose/Rate Route Frequency Ordered Stop   07/01/21 2200  nirmatrelvir/ritonavir EUA (PAXLOVID) 3 tablet  Status:  Discontinued        3 tablet Oral 2 times daily 07/01/21 1900 07/01/21 1935   07/01/21 2200  molnupiravir EUA (LAGEVRIO) capsule 800 mg        4 capsule Oral 2 times daily 07/01/21 1935 07/06/21 2159       Subjective: Patient seen and evaluated today and is currently getting a shave. No acute overnight events noted per staff at bedside.  Objective: Vitals:   07/18/21 0537 07/18/21 1311 07/19/21 0006 07/19/21 0912  BP: 132/80 135/74  140/86  Pulse: 93 84    Resp: 18 20    Temp: 98 F (36.7 C) 98.1 F (36.7 C) 97.6 F (36.4 C)   TempSrc: Oral Oral Oral   SpO2: 97% 98%    Weight:      Height:        Intake/Output Summary (Last 24 hours) at 07/19/2021 1016 Last data filed at 07/19/2021 0910 Gross per 24 hour  Intake 1068.8 ml  Output --  Net 1068.8 ml   Filed Weights   07/01/21 0649 07/03/21 0845  Weight: 63.5 kg 63.9 kg    Examination:  General exam: Appears calm and comfortable  Respiratory system: Clear to auscultation. Respiratory effort normal. Cardiovascular system: S1 & S2 heard, RRR.  Gastrointestinal system: Abdomen is soft Central nervous system: Alert and awake Extremities: No edema Skin: No significant lesions noted Psychiatry: Flat affect.    Data Reviewed: I have personally reviewed following labs and imaging studies  CBC: Recent Labs  Lab 07/18/21  2016  WBC 8.4  NEUTROABS 4.5  HGB 11.9*  HCT 35.5*  MCV 95.2  PLT 631   Basic Metabolic Panel: Recent Labs  Lab 07/13/21 0259 07/14/21 0600 07/14/21 0800 07/15/21 0327 07/16/21 0407 07/18/21 2016  NA 130* 131*  --  133* 134* 134*  K 3.3* 3.0*  --  3.3* 3.5 4.2  CL 94* 95*  --  98 99 97*  CO2 27 28  --  28 29 30   GLUCOSE 91 92  --  89 85 87  BUN 6* 5*  --  7* 6* 10  CREATININE 0.50* 0.51*  --   0.50* 0.42* 0.53*  CALCIUM 8.5* 8.6*  --  8.5* 8.5* 9.1  MG 2.1 1.9 1.8 2.1 2.1  --    GFR: Estimated Creatinine Clearance: 83.2 mL/min (A) (by C-G formula based on SCr of 0.53 mg/dL (L)). Liver Function Tests: No results for input(s): AST, ALT, ALKPHOS, BILITOT, PROT, ALBUMIN in the last 168 hours. No results for input(s): LIPASE, AMYLASE in the last 168 hours. No results for input(s): AMMONIA in the last 168 hours. Coagulation Profile: No results for input(s): INR, PROTIME in the last 168 hours. Cardiac Enzymes: Recent Labs  Lab 07/18/21 2016  CKTOTAL 77   BNP (last 3 results) No results for input(s): PROBNP in the last 8760 hours. HbA1C: No results for input(s): HGBA1C in the last 72 hours. CBG: No results for input(s): GLUCAP in the last 168 hours. Lipid Profile: No results for input(s): CHOL, HDL, LDLCALC, TRIG, CHOLHDL, LDLDIRECT in the last 72 hours. Thyroid Function Tests: No results for input(s): TSH, T4TOTAL, FREET4, T3FREE, THYROIDAB in the last 72 hours. Anemia Panel: No results for input(s): VITAMINB12, FOLATE, FERRITIN, TIBC, IRON, RETICCTPCT in the last 72 hours. Sepsis Labs: No results for input(s): PROCALCITON, LATICACIDVEN in the last 168 hours.  No results found for this or any previous visit (from the past 240 hour(s)).       Radiology Studies: No results found.      Scheduled Meds:  ARIPiprazole  5 mg Oral QHS   busPIRone  10 mg Oral TID   Chlorhexidine Gluconate Cloth  6 each Topical Daily   clonazePAM  1 mg Oral BID   enoxaparin (LOVENOX) injection  40 mg Subcutaneous Q24H   hydrocortisone   Rectal BID   lisinopril  30 mg Oral Daily   mouth rinse  15 mL Mouth Rinse BID   pantoprazole  40 mg Oral QAC breakfast   potassium chloride  20 mEq Oral BID   sodium chloride flush  10-40 mL Intracatheter Q12H   sucralfate  1 g Oral QID   zolpidem  5 mg Oral QHS   Continuous Infusions:  sodium chloride 10 mL/hr at 07/18/21 0551     LOS: 17  days    Time spent: 35 minutes    Angalina Ante Darleen Crocker, DO [facility]  If 7PM-7AM, please contact night-coverage [email or web address] 07/19/2021, 10:16 AM

## 2021-07-19 NOTE — Progress Notes (Signed)
°   07/19/21 1100  Mobility  Activity Ambulated with assistance in hallway  Level of Assistance Standby assist, set-up cues, supervision of patient - no hands on  Assistive Device Front wheel walker  Distance Ambulated (ft) 80 ft  Activity Response Tolerated well  $Mobility charge 1 Mobility   Pt agreeable to mobilize this morning. Prior to session, pt ambulated to bathroom independently. Ambulated about 49f in hall with RW, tolerated well. He stated that his legs felt weak today. Left pt in bed, call bell at side. RN/NT notified of session. Sitter present.   KHillcrestSpecialist Acute Rehab Services Office: [phone]

## 2021-07-19 NOTE — Consult Note (Signed)
[place] [facility] Consult   Reason for Consult:  Suicide attempt by overdose on cymbalta Referring Physician:  Dr. Olevia Bowens Patient Identification: Anthony Lamb MRN:  [id number] Principal Diagnosis: Antidepressant overdose Diagnosis:  Active Problems:   Essential hypertension   Major depression, recurrent ([place])   Hyponatremia   Gastroesophageal reflux disease   Alcohol abuse   Suicide attempt ([place])   Allergic reaction   Total Time spent with patient: 20 minutes  Subjective:   SAHIR TOLSON is a 66 y.o. male patient admitted with suicide attempt by overdose.  Patient presented after overdosing on (60) 60 mg tablets of Cymbalta, he subsequently developed serotonin syndrome and severe hyponatremia that resulted in admission to intensive care for hyponatremia protocol.    Patient is seen and reassessed by this nurse practitioner.  Case discussed with Dr. Lovette Cliche. Todays evaluation patient was observed to be lying bed, with his eyes close. He no longer is disheveled, and has been cleaned and shaved. He is unable to recall the name of the person who helped cut his hair and shave his beard, but he does reply Thank you in response to my compliment. Patient is very tearful, dysphoric mood. He continues to endorse "not feeling well secondary to lack of sleep and not having his Ativan for his anxiety. He continues to be very distraught over medication changes, although he is able to verbalize and comprehend that he is on a benzodiazapine taper to replace his Ativan. When assessing for suicidality, he replies he is going to try again and can not live like this. He then makes suicidal statements " Im not going to be here much longer. I cant stand to live like this.  " He continues to endorse sleep disturbances.  He is currently taking Abilify 5 mg p.o. nightly, hydroxyzine 25 mg p.o. every 4 hours as needed, BuSpar 10 mg p.o. 3 times daily.  He is tolerating well at this time, denies any  side effects and or adverse reactions.    He is observed lying in bed, brightens upon approach.   Patient continues to perseverate about his anxiety medication and sleep.  Today he endorses active suicidal ideations, with no plan. He is able to contract for safety at this time.   Patient is now medically stable discharge to inpatient general psych hospital for crisis stabilization, medication management, and behavior modification. Patient has been declined at multiple acute inpatient facilities, suspect underlying barrier is a diagnosis of dementia in the context of alcohol withdraw and Delirium tremens. Patient has a significant history of alcohol abuse, that was treated with  long term use of benzodiazepines which has now developed into severe dependency.    HPI:  MALCOMB GANGEMI is a 66 y.o. male with a past medical history significant for hypertension, GERD, peptic ulcer disease, ankylosing spondylitis, irritable bowel syndrome, chronic pain, anxiety, and depression who presents for suicide attempt by overdose.  According to EMS and patient, at 545 this morning, patient tried to overdose by taking 60 duloxetine tablets.  EMS thought that they were 60 mg tablets by report.  Patient reports she is feeling some waxing waning nausea, lightheadedness, and feels like he has no bowel movement.  He feels very dehydrated and tired.  He also is feeling very anxious and agitated.  He still says he wants to kill himself.  He reports that he was discharged from the emergency department yesterday at [facility] and he still says he is going to try to kill  himself he leaves again today.  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 Nancy Fetter at [facility].  Past Medical History:  Past Medical History:  Diagnosis Date   Alcohol abuse, in remission    Anal fissure    Anemia    Ankylosing spondylitis (HCC)    Anxiety    Arthritis    Chronic diarrhea    Chronic headaches    Chronic pain    Colitis    Colon polyps    Depression    Esophagitis    Gastric polyps    hyperplastic and fundic gland   Gastritis    GERD (gastroesophageal reflux disease)    Hypertension    IBS (irritable bowel syndrome)    Poor dentition    SIADH (syndrome of inappropriate ADH production) ([place])     Past Surgical History:  Procedure Laterality Date   BIOPSY  11/26/2018   Procedure: BIOPSY;  Surgeon: Gatha Mayer, MD;  Location: [facility];  Service: Endoscopy;;   COLONOSCOPY W/ BIOPSIES     COLONOSCOPY WITH PROPOFOL N/A 11/26/2018   Procedure: COLONOSCOPY WITH PROPOFOL;  Surgeon: Gatha Mayer, MD;  Location: [facility];  Service: Endoscopy;  Laterality: N/A;   ESOPHAGOGASTRODUODENOSCOPY     ESOPHAGOGASTRODUODENOSCOPY (EGD) WITH PROPOFOL N/A 11/26/2018   Procedure: ESOPHAGOGASTRODUODENOSCOPY (EGD) WITH PROPOFOL;  Surgeon: Gatha Mayer, MD;  Location: [facility];  Service: Endoscopy;  Laterality: N/A;   HEMOSTASIS CLIP PLACEMENT  11/26/2018   Procedure: HEMOSTASIS CLIP PLACEMENT;  Surgeon: Gatha Mayer, MD;  Location: [facility];  Service: Endoscopy;;   HERNIA REPAIR Bilateral    HOT HEMOSTASIS N/A 11/26/2018   Procedure: HOT HEMOSTASIS (ARGON PLASMA COAGULATION/BICAP);  Surgeon: Gatha Mayer, MD;  Location: [facility];  Service: Endoscopy;  Laterality: N/A;   OPEN REDUCTION INTERNAL FIXATION (ORIF) DISTAL RADIAL FRACTURE Right 02/11/2018   Procedure: OPEN REDUCTION INTERNAL FIXATION (ORIF) DISTAL RADIAL FRACTURE;  Surgeon: Milly Jakob, MD;  Location: [facility];  Service: Orthopedics;  Laterality: Right;   POLYPECTOMY  11/26/2018   Procedure: POLYPECTOMY;  Surgeon: Gatha Mayer, MD;  Location: [facility];  Service: Endoscopy;;   TONSILLECTOMY     Family History:  Family History  Problem Relation Age of Onset   Anxiety disorder Mother    Congestive Heart Failure Mother    Crohn's disease Mother    Colon cancer Mother 72   Arthritis Father    High blood pressure Father    Crohn's disease Father    Family Psychiatric  History: Denies Social History:  Social History   Substance and Sexual Activity  Alcohol Use Not Currently     Social History   Substance and Sexual Activity  Drug Use Not Currently   Types: Marijuana    Social History   Socioeconomic History   Marital status: Single    Spouse name: Not on file   Number of children: 0   Years of education: Not on file   Highest education level: Not on file  Occupational History   Not on file  Tobacco Use   Smoking status: Former    Types: Cigarettes    Quit date: 2014    Years since quitting: 9.1   Smokeless tobacco: Never  Vaping Use   Vaping  Use: Never used  Substance and Sexual Activity   Alcohol use: Not Currently   Drug use: Not Currently    Types: Marijuana   Sexual activity: Not Currently  Other Topics Concern   Not on file  Social History Narrative   HSG. Long - term disability - unable to work. Lived with his mother in her house - she died 05-13-23 -    Lives in apartment   Has case worker   Social Determinants of Radio broadcast assistant Strain: Not on file  Food Insecurity: Not on file  Transportation Needs: Not on file  Physical Activity: Not on file  Stress: Not on file  Social Connections: Not on file   Additional Social History:    Allergies:   Allergies  Allergen Reactions   Nsaids Other (See Comments)    GI upset- history of peptic ulcers!!   Diphenhydramine Hcl Other (See Comments)    Restlessness   Flexeril [Cyclobenzaprine] Other (See Comments)    Restlessness   Fluoxetine Other (See Comments)    Made the patient feel "worse than before" (treatment)   Hctz  [Hydrochlorothiazide] Other (See Comments)    Caused to lose sodium when taking with Lisinopril   Rexulti [Brexpiprazole] Other (See Comments)    "Made me not feel right- restless"   Seroquel [Quetiapine] Other (See Comments)    Restless legs and makes the patient sweat- also does not help patient's insomnia. Pt takes this at home in 2023.   Gabapentin Diarrhea   Hydroxyzine Other (See Comments)    Restlessness    Labs:  Results for orders placed or performed during the hospital encounter of 07/01/21 (from the past 48 hour(s))  Basic metabolic panel     Status: Abnormal   Collection Time: 07/18/21  8:16 PM  Result Value Ref Range   Sodium 134 (L) 135 - 145 mmol/L   Potassium 4.2 3.5 - 5.1 mmol/L   Chloride 97 (L) 98 - 111 mmol/L   CO2 30 22 - 32 mmol/L   Glucose, Bld 87 70 - 99 mg/dL    Comment: Glucose reference range applies only to samples taken after fasting for at least 8 hours.   BUN 10 8 - 23 mg/dL   Creatinine, Ser 0.53 (L) 0.61 - 1.24 mg/dL   Calcium 9.1 8.9 - 10.3 mg/dL   GFR, Estimated >60 >60 mL/min    Comment: (NOTE) Calculated using the CKD-EPI Creatinine Equation (2021)    Anion gap 5 5 - 15    Comment: Performed at [facility], [place] [address], [place], [place] [address]  CBC with Differential/Platelet     Status: Abnormal   Collection Time: 07/18/21  8:16 PM  Result Value Ref Range   WBC 8.4 4.0 - 10.5 K/uL   RBC 3.73 (L) 4.22 - 5.81 MIL/uL   Hemoglobin 11.9 (L) 13.0 - 17.0 g/dL   HCT 35.5 (L) 39.0 - 52.0 %   MCV 95.2 80.0 - 100.0 fL   MCH 31.9 26.0 - 34.0 pg   MCHC 33.5 30.0 - 36.0 g/dL   RDW 13.2 11.5 - 15.5 %   Platelets 331 150 - 400 K/uL   nRBC 0.0 0.0 - 0.2 %   Neutrophils Relative % 54 %   Neutro Abs 4.5 1.7 - 7.7 K/uL   Lymphocytes Relative 32 %   Lymphs Abs 2.7 0.7 - 4.0 K/uL   Monocytes Relative 12 %   Monocytes Absolute 1.0 0.1 - 1.0 K/uL  Eosinophils Relative 1 %   Eosinophils Absolute 0.1 0.0 - 0.5 K/uL   Basophils  Relative 1 %   Basophils Absolute 0.1 0.0 - 0.1 K/uL   Immature Granulocytes 0 %   Abs Immature Granulocytes 0.02 0.00 - 0.07 K/uL    Comment: Performed at [facility], [place] [address] [place], [place] [address]  CK     Status: None   Collection Time: 07/18/21  8:16 PM  Result Value Ref Range   Total CK 77 49 - 397 U/L    Comment: Performed at [facility], [place] [address], [place], [place] [address]     Current Facility-Administered Medications  Medication Dose Route Frequency Provider Last Rate Last Admin   0.9 %  sodium chloride infusion   Intravenous PRN Dwyane Dee, MD 10 mL/hr at 07/18/21 0551 New Bag at 07/18/21 0551   acetaminophen (TYLENOL) tablet 650 mg  650 mg Oral Q4H PRN Dwyane Dee, MD   650 mg at 07/19/21 1749   alum & mag hydroxide-simeth (MAALOX/MYLANTA) 200-200-20 MG/5ML suspension 30 mL  30 mL Oral Q4H PRN Dwyane Dee, MD   30 mL at 07/15/21 1122   ARIPiprazole (ABILIFY) tablet 5 mg  5 mg Oral QHS Suella Broad, FNP   5 mg at 07/19/21 2059   busPIRone (BUSPAR) tablet 10 mg  10 mg Oral TID Dwyane Dee, MD   10 mg at 07/19/21 2055   Chlorhexidine Gluconate Cloth 2 % PADS 6 each  6 each Topical Daily Dwyane Dee, MD   6 each at 07/19/21 0913   clonazePAM (KLONOPIN) tablet 1 mg  1 mg Oral BID Suella Broad, FNP   1 mg at 07/19/21 2055   diphenhydrAMINE-zinc acetate (BENADRYL) 2-0.1 % cream   Topical TID PRN Dwyane Dee, MD   Given at 07/18/21 1148   enoxaparin (LOVENOX) injection 40 mg  40 mg Subcutaneous Q24H Dwyane Dee, MD   40 mg at 07/19/21 2055   hydrALAZINE (APRESOLINE) tablet 25 mg  25 mg Oral Q4H PRN Dwyane Dee, MD   25 mg at 07/13/21 1701   hydrocortisone (ANUSOL-HC) 2.5 % rectal cream   Rectal BID Jennye Boroughs, MD   Given at 07/17/21 1701   hydrOXYzine (ATARAX) tablet 25 mg  25 mg Oral Q4H PRN Dwyane Dee, MD   25 mg at 07/19/21 2015   labetalol (NORMODYNE) injection 10 mg  10 mg  Intravenous Q4H PRN Dwyane Dee, MD   10 mg at 07/13/21 1209   lisinopril (ZESTRIL) tablet 30 mg  30 mg Oral Daily Dwyane Dee, MD   30 mg at 07/19/21 0912   loperamide (IMODIUM) capsule 2 mg  2 mg Oral PRN Phillips Grout, MD   2 mg at 07/15/21 1734   loratadine (CLARITIN) tablet 10 mg  10 mg Oral Daily Manuella Ghazi, Pratik D, DO   10 mg at 07/19/21 1410   MEDLINE mouth rinse  15 mL Mouth Rinse BID Dwyane Dee, MD   15 mL at 07/19/21 0913   naphazoline-pheniramine (NAPHCON-A) 0.025-0.3 % ophthalmic solution 1 drop  1 drop Both Eyes QID PRN Heath Lark D, DO   1 drop at 07/19/21 1750   oxyCODONE (Oxy IR/ROXICODONE) immediate release tablet 5 mg  5 mg Oral Q12H PRN Heath Lark D, DO   5 mg at 07/19/21 2102   pantoprazole (PROTONIX) EC tablet 40 mg  40 mg Oral QAC breakfast Dwyane Dee, MD   40 mg at 07/19/21 [phone]  phenol (CHLORASEPTIC) mouth spray 1 spray  1 spray Mouth/Throat PRN Dwyane Dee, MD   1 spray at 07/11/21 2054   polyethylene glycol (MIRALAX / GLYCOLAX) packet 17 g  17 g Oral Daily PRN Dwyane Dee, MD   17 g at 07/06/21 2426   polyvinyl alcohol (LIQUIFILM TEARS) 1.4 % ophthalmic solution 1 drop  1 drop Both Eyes PRN Jennye Boroughs, MD   1 drop at 07/17/21 2106   potassium chloride SA (KLOR-CON M) CR tablet 20 mEq  20 mEq Oral BID Phillips Grout, MD   20 mEq at 07/19/21 2055   prochlorperazine (COMPAZINE) injection 5 mg  5 mg Intravenous Q6H PRN Dwyane Dee, MD   5 mg at 07/15/21 0856   sodium chloride (OCEAN) 0.65 % nasal spray 1 spray  1 spray Each Nare PRN Dwyane Dee, MD   1 spray at 07/17/21 0429   sodium chloride flush (NS) 0.9 % injection 10-40 mL  10-40 mL Intracatheter Q12H Dwyane Dee, MD   10 mL at 07/18/21 2200   sodium chloride flush (NS) 0.9 % injection 10-40 mL  10-40 mL Intracatheter PRN Dwyane Dee, MD       sucralfate (CARAFATE) tablet 1 g  1 g Oral QID Dwyane Dee, MD   1 g at 07/19/21 2055   zolpidem (AMBIEN) tablet 5 mg  5 mg Oral Standley Brooking, MD   5 mg at 07/19/21 2059    Musculoskeletal: Strength & Muscle Tone: within normal limits Gait & Station: normal Patient leans: N/A     Psychiatric Specialty Exam:  Presentation  General Appearance: Appropriate for Environment; Casual  Eye Contact:Fair  Speech:Clear and Coherent; Normal Rate  Speech Volume:Normal  Handedness:Right   Mood and Affect  Mood:Dysphoric; Hopeless; Worthless  Affect:Tearful; Depressed; Flat   Thought Process  Thought Processes:Coherent; Linear  Descriptions of Associations:Tangential  Orientation:Full (Time, Place and Person)  Thought Content:Logical; Scattered  History of Schizophrenia/Schizoaffective disorder:No  Duration of Psychotic Symptoms:Less than six months  Hallucinations:Hallucinations: None  Ideas of Reference:None  Suicidal Thoughts:Suicidal Thoughts: Yes, Active SI Active Intent and/or Plan: With Intent; Without Plan; Without Means to Carry Out; Without Access to Means  Homicidal Thoughts:Homicidal Thoughts: No   Sensorium  Memory:Immediate Fair; Recent Fair; Remote Fair  Judgment:Fair  Insight:Poor   Executive Functions  Concentration:Fair  Attention Span:Fair  [place]   Psychomotor Activity  Psychomotor Activity:Psychomotor Activity: Restlessness; Tremor   Assets  Assets:Communication Skills; Financial Resources/Insurance; Housing; Social Support; Physical Health; Leisure Time   Sleep  Sleep:Sleep: Poor   Physical Exam: Physical Exam Vitals and nursing note reviewed.  Constitutional:      Appearance: Normal appearance. He is normal weight.  HENT:     Head: Normocephalic.  Eyes:     General: Lids are normal.  Neurological:     General: No focal deficit present.     Mental Status: He is alert and oriented to person, place, and time. Mental status is at baseline.     Motor: Tremor present. No atrophy.  Psychiatric:         Attention and Perception: Attention and perception normal.        Mood and Affect: Mood is anxious and depressed. Affect is tearful.        Speech: Speech normal.        Behavior: Behavior is withdrawn. Behavior is cooperative.        Thought Content: Thought content includes suicidal ideation.  Cognition and Memory: Cognition and memory normal.   Review of Systems  Neurological:  Positive for tremors, focal weakness and weakness.  Psychiatric/Behavioral:  Positive for depression, substance abuse (alcohol use, last drank 1 month ago) and suicidal ideas (suicide attempt). The patient is nervous/anxious and has insomnia (takes [place]).   All other systems reviewed and are negative. Blood pressure (!) 142/79, pulse 89, temperature 98 F (36.7 C), temperature source Oral, resp. rate 20, height 5' 10"  (1.778 m), weight 63.9 kg, SpO2 98 %. Body mass index is 20.21 kg/m.   Treatment Plan Summary: Daily contact with patient to assess and evaluate symptoms and progress in treatment, Medication management, and Plan     Recommended for inpatient psychiatric treatment. Patient unable to contract for safety.  -Patient is to remain under involuntary commitment at this time, as he continues to be a danger to himself and unable to contract for safety.   -Will continue Abilify 5 mg mg p.o. nightly to target psychosis, depressive symptoms, anxiety, and suicidal ideations.  Increase BuSpar 15 mg p.o. 3 times daily. -Will start Klonopin 0.5 mg p.o. daily every morning; and Klonopin 1 mg p.o. nightly. Due to increase risk of EPS will discontinue Haldol and COmpazine at this time.  Insomnia-Patient continues to endorse poor sleep habits recommend documenting sleep hours byt night shift. Patient may benefit from Propranolol for management of insomnia, anxiety and tremors.  -He is medically stable to discharge to inpatient psychiatric facility. Patient has been declined at multiple facilities will  recommend referral to [facility] at this time. Patient with chronic suicidality, multiple suicide attempts of high lethality, no safe disposition plan at this time. WIll continue to work towards disposition for this patient will likely require long term care and supervision.   Disposition: Recommend psychiatric Inpatient admission when medically cleared. IVC  Suella Broad, FNP 07/19/2021 9:25 PM

## 2021-07-20 DIAGNOSIS — I10 Essential (primary) hypertension: Secondary | ICD-10-CM | POA: Diagnosis not present

## 2021-07-20 DIAGNOSIS — T43202A Poisoning by unspecified antidepressants, intentional self-harm, initial encounter: Secondary | ICD-10-CM | POA: Diagnosis not present

## 2021-07-20 DIAGNOSIS — T50902A Poisoning by unspecified drugs, medicaments and biological substances, intentional self-harm, initial encounter: Secondary | ICD-10-CM | POA: Diagnosis not present

## 2021-07-20 DIAGNOSIS — K219 Gastro-esophageal reflux disease without esophagitis: Secondary | ICD-10-CM

## 2021-07-20 LAB — BASIC METABOLIC PANEL
Anion gap: 7 (ref 5–15)
BUN: 11 mg/dL (ref 8–23)
CO2: 30 mmol/L (ref 22–32)
Calcium: 8.9 mg/dL (ref 8.9–10.3)
Chloride: 97 mmol/L — ABNORMAL LOW (ref 98–111)
Creatinine, Ser: 0.49 mg/dL — ABNORMAL LOW (ref 0.61–1.24)
GFR, Estimated: >60 mL/min (ref >60)
Glucose, Bld: 98 mg/dL (ref 70–99)
Potassium: 3.8 mmol/L (ref 3.5–5.1)
Sodium: 134 mmol/L — ABNORMAL LOW (ref 135–145)

## 2021-07-20 NOTE — Progress Notes (Signed)
Physical Therapy Treatment Patient Details Name: Anthony Lamb MRN: [id number] DOB: 1955-07-19 Today's Date: 07/20/2021   History of Present Illness 66 year old male who came to hospital on 07/01/21 after taking 60 tablets of duloxetine.  He has had multiple ER visits since September for anxiety, suicidal ideation, pain and other issues.  Pt has been involuntary commited with plan for transfer to inpt psych due to [place].Pt with history of EtOH abuse, ankylosing spondylitis, anxiety, depression, history of colitis and chronic diarrhea, chronic headaches, chronic pain, hypertension,    PT Comments    Pt making good progress in regards to mobility.  He is between mod I to supervision level for transfers and gait.  Did ambulate 300' with RW.  Min cues for safety/posture.  Pt with slow gait speed indicating fall risk.  At baseline, pt does not use RW - will continue to benefit from PT to advance to LRAD and improve gait speed. From a PURELY MOBILITY perspective, only needs intermittent supervision for IADLs and is at [place] level.  Noted plan for d/c to [facility].   Recommendations for follow up therapy are one component of a multi-disciplinary discharge planning process, led by the attending physician.  Recommendations may be updated based on patient status, additional functional criteria and insurance authorization.  Follow Up Recommendations  Home health PT     Assistance Recommended at Discharge Intermittent Supervision/Assistance  Patient can return home with the following A little help with walking and/or transfers;Assistance with cooking/housework;Help with stairs or ramp for entrance   Equipment Recommendations  Rolling walker (2 wheels)    Recommendations for Other Services       Precautions / Restrictions Precautions Precautions: Fall     Mobility  Bed Mobility Overal bed mobility: Modified Independent Bed Mobility: Supine to Sit     Supine to sit: Modified independent  (Device/Increase time), HOB elevated          Transfers Overall transfer level: Needs assistance Equipment used: Rolling walker (2 wheels) Transfers: Sit to/from Stand Sit to Stand: Supervision                Ambulation/Gait Ambulation/Gait assistance: Min guard, Supervision Gait Distance (Feet): 300 Feet Assistive device: Rolling walker (2 wheels) Gait Pattern/deviations: Decreased stride length, Step-through pattern, Trunk flexed Gait velocity: decreased     General Gait Details: Min guard progressing to close supervision; pt with no LOB but with slow gait; min cues for posture   Stairs             Wheelchair Mobility    Modified Rankin (Stroke Patients Only)       Balance Overall balance assessment: Needs assistance Sitting-balance support: Feet supported Sitting balance-Leahy Scale: Good     Standing balance support: Bilateral upper extremity supported, No upper extremity supported Standing balance-Leahy Scale: Fair Standing balance comment: Static stand no AD; RW to ambulate                            Cognition Arousal/Alertness: Awake/alert Behavior During Therapy: Flat affect Overall Cognitive Status: No family/caregiver present to determine baseline cognitive functioning Area of Impairment: Orientation, Safety/judgement                               General Comments: Pt perserverates on negatives and needs redirecting/encouraging for activity        Exercises      General Comments  General comments (skin integrity, edema, etc.): VSS      Pertinent Vitals/Pain Pain Assessment Pain Assessment: Faces Faces Pain Scale: Hurts little more Pain Location: generalized    Home Living                          Prior Function            PT Goals (current goals can now be found in the care plan section) Acute Rehab PT Goals PT Goal Formulation: Patient unable to participate in goal setting Time For  Goal Achievement: 08/03/21 Potential to Achieve Goals: Fair Progress towards PT goals: Goals met and updated - see care plan    Frequency    Min 2X/week      PT Plan Current plan remains appropriate    Co-evaluation              AM-PAC PT "6 Clicks" Mobility   Outcome Measure  Help needed turning from your back to your side while in a flat bed without using bedrails?: None Help needed moving from lying on your back to sitting on the side of a flat bed without using bedrails?: None Help needed moving to and from a bed to a chair (including a wheelchair)?: A Little Help needed standing up from a chair using your arms (e.g., wheelchair or bedside chair)?: A Little Help needed to walk in hospital room?: A Little Help needed climbing 3-5 steps with a railing? : A Little 6 Click Score: 20    End of Session Equipment Utilized During Treatment: Gait belt Activity Tolerance: Patient tolerated treatment well Patient left: in bed;with call bell/phone within reach;with nursing/sitter in room Nurse Communication: Mobility status PT Visit Diagnosis: Muscle weakness (generalized) (M62.81);Unsteadiness on feet (R26.81)     Time: 1798-1025 PT Time Calculation (min) (ACUTE ONLY): 17 min  Charges:  $Gait Training: 8-22 mins                     Abran Richard, PT Acute Rehab Services Pager [phone] [facility] 07/20/2021, 12:25 PM

## 2021-07-20 NOTE — Consult Note (Signed)
[facility] Consult   Reason for Consult:  Suicide attempt by overdose on cymbalta Referring Physician:  Dr. Olevia Bowens Patient Identification: Anthony Lamb MRN:  [id number] Principal Diagnosis: Antidepressant overdose Diagnosis:  Active Problems:   Essential hypertension   Major depression, recurrent ([place])   Hyponatremia   Gastroesophageal reflux disease   Alcohol abuse   Suicide attempt ([place])   Allergic reaction   Total Time spent with patient: 20 minutes  Subjective:   Anthony Lamb is a 66 y.o. male patient admitted with suicide attempt by overdose.  Patient presented after overdosing on (60) 60 mg tablets of Cymbalta, he subsequently developed serotonin syndrome and severe hyponatremia that resulted in admission to intensive care for hyponatremia protocol.  Patient is seen and reassessed by this nurse practitioner.  Case discussed with Dr. Lovette Cliche.  Patient presents with much improved affect and brightens upon approach today. He continues to endorse poor sleeping habits, and ruminates about his medication changes. He remains preoccupied on his medications and his need for them. Today he denies suicidal ideations, homicidal ideations, and hallucinations. He is advised current recommendations are state hospital, in which he begins to plead with writer not to send him to a state facility. He is advised that he will need to be able to keep himself safe, to avoid transfer to a state hospital. We discussed at length his behaviors, dependence on controlled medications and multiple suicide attempts 2/t running out of medications. He is currently taking Abilify 5 mg p.o. nightly, hydroxyzine 25 mg p.o. every 4 hours as needed, BuSpar 15 mg p.o. 3 times daily.  He is tolerating well at this time, denies any side effects and or adverse reactions.    Patient is now medically stable discharge to inpatient general psych hospital for crisis stabilization, medication management,  and behavior modification. Patient has been declined at multiple acute inpatient facilities, suspect underlying barrier is a diagnosis of dementia in the context of alcohol withdraw and Delirium tremens. Patient has a significant history of alcohol abuse, that was treated with  long term use of benzodiazepines which has now developed into severe dependency.   HPI:  Anthony Lamb is a 66 y.o. male with a past medical history significant for hypertension, GERD, peptic ulcer disease, ankylosing spondylitis, irritable bowel syndrome, chronic pain, anxiety, and depression who presents for suicide attempt by overdose.  According to EMS and patient, at 545 this morning, patient tried to overdose by taking 60 duloxetine tablets.  EMS thought that they were 60 mg tablets by report.  Patient reports she is feeling some waxing waning nausea, lightheadedness, and feels like he has no bowel movement.  He feels very dehydrated and tired.  He also is feeling very anxious and agitated.  He still says he wants to kill himself.  He reports that he was discharged from the emergency department yesterday at [facility] and he still says he is going to try to kill himself he leaves again today.  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 Nancy Fetter at [facility].  Past Medical History:  Past Medical History:  Diagnosis Date   Alcohol abuse, in remission    Anal fissure    Anemia    Ankylosing spondylitis (HCC)    Anxiety  Arthritis    Chronic diarrhea    Chronic headaches    Chronic pain    Colitis    Colon polyps    Depression    Esophagitis    Gastric polyps    hyperplastic and fundic gland   Gastritis     GERD (gastroesophageal reflux disease)    Hypertension    IBS (irritable bowel syndrome)    Poor dentition    SIADH (syndrome of inappropriate ADH production) ([place])     Past Surgical History:  Procedure Laterality Date   BIOPSY  11/26/2018   Procedure: BIOPSY;  Surgeon: Gatha Mayer, MD;  Location: [facility];  Service: Endoscopy;;   COLONOSCOPY W/ BIOPSIES     COLONOSCOPY WITH PROPOFOL N/A 11/26/2018   Procedure: COLONOSCOPY WITH PROPOFOL;  Surgeon: Gatha Mayer, MD;  Location: [facility];  Service: Endoscopy;  Laterality: N/A;   ESOPHAGOGASTRODUODENOSCOPY     ESOPHAGOGASTRODUODENOSCOPY (EGD) WITH PROPOFOL N/A 11/26/2018   Procedure: ESOPHAGOGASTRODUODENOSCOPY (EGD) WITH PROPOFOL;  Surgeon: Gatha Mayer, MD;  Location: [facility];  Service: Endoscopy;  Laterality: N/A;   HEMOSTASIS CLIP PLACEMENT  11/26/2018   Procedure: HEMOSTASIS CLIP PLACEMENT;  Surgeon: Gatha Mayer, MD;  Location: [facility];  Service: Endoscopy;;   HERNIA REPAIR Bilateral    HOT HEMOSTASIS N/A 11/26/2018   Procedure: HOT HEMOSTASIS (ARGON PLASMA COAGULATION/BICAP);  Surgeon: Gatha Mayer, MD;  Location: [facility];  Service: Endoscopy;  Laterality: N/A;   OPEN REDUCTION INTERNAL FIXATION (ORIF) DISTAL RADIAL FRACTURE Right 02/11/2018   Procedure: OPEN REDUCTION INTERNAL FIXATION (ORIF) DISTAL RADIAL FRACTURE;  Surgeon: Milly Jakob, MD;  Location: [facility];  Service: Orthopedics;  Laterality: Right;   POLYPECTOMY  11/26/2018   Procedure: POLYPECTOMY;  Surgeon: Gatha Mayer, MD;  Location: [facility];  Service: Endoscopy;;   TONSILLECTOMY     Family History:  Family History  Problem Relation Age of Onset   Anxiety disorder Mother    Congestive Heart Failure Mother    Crohn's disease Mother    Colon cancer Mother 14   Arthritis Father    High blood pressure Father    Crohn's disease Father    Family Psychiatric  History: Denies Social History:  Social History   Substance and Sexual  Activity  Alcohol Use Not Currently     Social History   Substance and Sexual Activity  Drug Use Not Currently   Types: Marijuana    Social History   Socioeconomic History   Marital status: Single    Spouse name: Not on file   Number of children: 0   Years of education: Not on file   Highest education level: Not on file  Occupational History   Not on file  Tobacco Use   Smoking status: Former    Types: Cigarettes    Quit date: 2014    Years since quitting: 9.1   Smokeless tobacco: Never  Vaping Use   Vaping Use: Never used  Substance and Sexual Activity   Alcohol use: Not Currently   Drug use: Not Currently    Types: Marijuana   Sexual activity: Not Currently  Other Topics Concern   Not on file  Social History Narrative   HSG. Long - term disability - unable to work. Lived with his mother in her house - she died 05/12/2023 -    Lives in apartment   Has case worker   Social Determinants of Radio broadcast assistant Strain: Not on file  Food Insecurity:  Not on file  Transportation Needs: Not on file  Physical Activity: Not on file  Stress: Not on file  Social Connections: Not on file   Additional Social History:    Allergies:   Allergies  Allergen Reactions   Nsaids Other (See Comments)    GI upset- history of peptic ulcers!!   Diphenhydramine Hcl Other (See Comments)    Restlessness   Flexeril [Cyclobenzaprine] Other (See Comments)    Restlessness   Fluoxetine Other (See Comments)    Made the patient feel "worse than before" (treatment)   Hctz [Hydrochlorothiazide] Other (See Comments)    Caused to lose sodium when taking with Lisinopril   Rexulti [Brexpiprazole] Other (See Comments)    "Made me not feel right- restless"   Seroquel [Quetiapine] Other (See Comments)    Restless legs and makes the patient sweat- also does not help patient's insomnia. Pt takes this at home in 2023.   Gabapentin Diarrhea   Hydroxyzine Other (See Comments)     Restlessness    Labs:  Results for orders placed or performed during the hospital encounter of 07/01/21 (from the past 48 hour(s))  Basic metabolic panel     Status: Abnormal   Collection Time: 07/18/21  8:16 PM  Result Value Ref Range   Sodium 134 (L) 135 - 145 mmol/L   Potassium 4.2 3.5 - 5.1 mmol/L   Chloride 97 (L) 98 - 111 mmol/L   CO2 30 22 - 32 mmol/L   Glucose, Bld 87 70 - 99 mg/dL    Comment: Glucose reference range applies only to samples taken after fasting for at least 8 hours.   BUN 10 8 - 23 mg/dL   Creatinine, Ser 0.53 (L) 0.61 - 1.24 mg/dL   Calcium 9.1 8.9 - 10.3 mg/dL   GFR, Estimated >60 >60 mL/min    Comment: (NOTE) Calculated using the CKD-EPI Creatinine Equation (2021)    Anion gap 5 5 - 15    Comment: Performed at [facility], [place] [address], [place], [place] [address]  CBC with Differential/Platelet     Status: Abnormal   Collection Time: 07/18/21  8:16 PM  Result Value Ref Range   WBC 8.4 4.0 - 10.5 K/uL   RBC 3.73 (L) 4.22 - 5.81 MIL/uL   Hemoglobin 11.9 (L) 13.0 - 17.0 g/dL   HCT 35.5 (L) 39.0 - 52.0 %   MCV 95.2 80.0 - 100.0 fL   MCH 31.9 26.0 - 34.0 pg   MCHC 33.5 30.0 - 36.0 g/dL   RDW 13.2 11.5 - 15.5 %   Platelets 331 150 - 400 K/uL   nRBC 0.0 0.0 - 0.2 %   Neutrophils Relative % 54 %   Neutro Abs 4.5 1.7 - 7.7 K/uL   Lymphocytes Relative 32 %   Lymphs Abs 2.7 0.7 - 4.0 K/uL   Monocytes Relative 12 %   Monocytes Absolute 1.0 0.1 - 1.0 K/uL   Eosinophils Relative 1 %   Eosinophils Absolute 0.1 0.0 - 0.5 K/uL   Basophils Relative 1 %   Basophils Absolute 0.1 0.0 - 0.1 K/uL   Immature Granulocytes 0 %   Abs Immature Granulocytes 0.02 0.00 - 0.07 K/uL    Comment: Performed at [facility], [place] [address] [place],  [address]  CK     Status: None   Collection Time: 07/18/21  8:16 PM  Result Value Ref Range   Total CK 77 49 - 397 U/L  Comment: Performed at [facility],  [place] [address], [place], [place] [address]  Basic metabolic panel     Status: Abnormal   Collection Time: 07/20/21  3:15 AM  Result Value Ref Range   Sodium 134 (L) 135 - 145 mmol/L   Potassium 3.8 3.5 - 5.1 mmol/L   Chloride 97 (L) 98 - 111 mmol/L   CO2 30 22 - 32 mmol/L   Glucose, Bld 98 70 - 99 mg/dL    Comment: Glucose reference range applies only to samples taken after fasting for at least 8 hours.   BUN 11 8 - 23 mg/dL   Creatinine, Ser 0.49 (L) 0.61 - 1.24 mg/dL   Calcium 8.9 8.9 - 10.3 mg/dL   GFR, Estimated >60 >60 mL/min    Comment: (NOTE) Calculated using the CKD-EPI Creatinine Equation (2021)    Anion gap 7 5 - 15    Comment: Performed at [facility], [place] [address] [place], [place] [address]     Current Facility-Administered Medications  Medication Dose Route Frequency Provider Last Rate Last Admin   0.9 %  sodium chloride infusion   Intravenous PRN Dwyane Dee, MD 10 mL/hr at 07/18/21 0551 New Bag at 07/18/21 0551   acetaminophen (TYLENOL) tablet 650 mg  650 mg Oral Q4H PRN Dwyane Dee, MD   650 mg at 07/20/21 1243   alum & mag hydroxide-simeth (MAALOX/MYLANTA) 200-200-20 MG/5ML suspension 30 mL  30 mL Oral Q4H PRN Dwyane Dee, MD   30 mL at 07/15/21 1122   ARIPiprazole (ABILIFY) tablet 5 mg  5 mg Oral QHS Suella Broad, FNP   5 mg at 07/19/21 2059   busPIRone (BUSPAR) tablet 15 mg  15 mg Oral TID Suella Broad, FNP   15 mg at 07/20/21 9470   Chlorhexidine Gluconate Cloth 2 % PADS 6 each  6 each Topical Daily Dwyane Dee, MD   6 each at 07/20/21 1057   clonazePAM (KLONOPIN) tablet 0.5 mg  0.5 mg Oral Q breakfast Suella Broad, FNP   0.5 mg at 07/20/21 9628   And   clonazePAM (KLONOPIN) tablet 1 mg  1 mg Oral QHS Starkes-Perry, Gayland Curry, FNP       diphenhydrAMINE-zinc acetate (BENADRYL) 2-0.1 % cream   Topical TID PRN Dwyane Dee, MD   Given at 07/18/21 1148   enoxaparin (LOVENOX) injection 40 mg  40 mg  Subcutaneous Q24H Dwyane Dee, MD   40 mg at 07/19/21 2055   hydrALAZINE (APRESOLINE) tablet 25 mg  25 mg Oral Q4H PRN Dwyane Dee, MD   25 mg at 07/13/21 1701   hydrocortisone (ANUSOL-HC) 2.5 % rectal cream   Rectal BID Jennye Boroughs, MD   Given at 07/17/21 1701   hydrOXYzine (ATARAX) tablet 25 mg  25 mg Oral Q4H PRN Dwyane Dee, MD   25 mg at 07/20/21 1339   labetalol (NORMODYNE) injection 10 mg  10 mg Intravenous Q4H PRN Dwyane Dee, MD   10 mg at 07/13/21 1209   lisinopril (ZESTRIL) tablet 30 mg  30 mg Oral Daily Dwyane Dee, MD   30 mg at 07/20/21 0900   loperamide (IMODIUM) capsule 2 mg  2 mg Oral PRN Phillips Grout, MD   2 mg at 07/15/21 1734   loratadine (CLARITIN) tablet 10 mg  10 mg Oral Daily Manuella Ghazi, Pratik D, DO   10 mg at 07/20/21 3662   MEDLINE mouth rinse  15 mL Mouth Rinse BID Dwyane Dee, MD  15 mL at 07/20/21 1057   naphazoline-pheniramine (NAPHCON-A) 0.025-0.3 % ophthalmic solution 1 drop  1 drop Both Eyes QID PRN Manuella Ghazi, Pratik D, DO   1 drop at 07/19/21 1750   oxyCODONE (Oxy IR/ROXICODONE) immediate release tablet 5 mg  5 mg Oral Q12H PRN Manuella Ghazi, Pratik D, DO   5 mg at 07/20/21 0920   pantoprazole (PROTONIX) EC tablet 40 mg  40 mg Oral QAC breakfast Dwyane Dee, MD   40 mg at 07/20/21 0905   phenol (CHLORASEPTIC) mouth spray 1 spray  1 spray Mouth/Throat PRN Dwyane Dee, MD   1 spray at 07/11/21 2054   polyethylene glycol (MIRALAX / GLYCOLAX) packet 17 g  17 g Oral Daily PRN Dwyane Dee, MD   17 g at 07/06/21 7408   polyvinyl alcohol (LIQUIFILM TEARS) 1.4 % ophthalmic solution 1 drop  1 drop Both Eyes PRN Jennye Boroughs, MD   1 drop at 07/17/21 2106   potassium chloride SA (KLOR-CON M) CR tablet 20 mEq  20 mEq Oral BID Derrill Kay A, MD   20 mEq at 07/20/21 1448   sodium chloride (OCEAN) 0.65 % nasal spray 1 spray  1 spray Each Nare PRN Dwyane Dee, MD   1 spray at 07/17/21 0429   sodium chloride flush (NS) 0.9 % injection 10-40 mL  10-40 mL  Intracatheter Q12H Dwyane Dee, MD   10 mL at 07/20/21 1856   sodium chloride flush (NS) 0.9 % injection 10-40 mL  10-40 mL Intracatheter PRN Dwyane Dee, MD       sucralfate (CARAFATE) tablet 1 g  1 g Oral QID Dwyane Dee, MD   1 g at 07/20/21 1339   zolpidem (AMBIEN) tablet 5 mg  5 mg Oral Standley Brooking, MD   5 mg at 07/19/21 2059    Musculoskeletal: Strength & Muscle Tone: within normal limits Gait & Station: normal Patient leans: N/A     Psychiatric Specialty Exam:  Presentation  General Appearance: Appropriate for Environment; Casual  Eye Contact:Fair  Speech:Clear and Coherent; Normal Rate  Speech Volume:Normal  Handedness:Right   Mood and Affect  Mood:Depressed  Affect:Appropriate; Congruent   Thought Process  Thought Processes:Coherent; Linear  Descriptions of Associations:Intact  Orientation:Full (Time, Place and Person)  Thought Content:Logical  History of Schizophrenia/Schizoaffective disorder:No  Duration of Psychotic Symptoms:N/A  Hallucinations:Hallucinations: None  Ideas of Reference:None  Suicidal Thoughts:Suicidal Thoughts: No SI Active Intent and/or Plan: With Intent; Without Plan; Without Means to Carry Out; Without Access to Means  Homicidal Thoughts:Homicidal Thoughts: No   Sensorium  Memory:Immediate Fair; Recent Fair; Remote Fair  Judgment:Fair  Insight:Fair   Executive Functions  Concentration:Fair  Attention Span:Fair  [place]   Psychomotor Activity  Psychomotor Activity:Psychomotor Activity: Restlessness; Tremor   Assets  Assets:Communication Skills; Financial Resources/Insurance; Housing; Social Support; Physical Health   Sleep  Sleep:Sleep: Poor   Physical Exam: Physical Exam Vitals and nursing note reviewed.  Constitutional:      Appearance: Normal appearance. He is normal weight.  HENT:     Head: Normocephalic.  Eyes:     General: Lids  are normal.  Neurological:     General: No focal deficit present.     Mental Status: He is alert and oriented to person, place, and time. Mental status is at baseline.     Motor: Tremor present. No atrophy.  Psychiatric:        Attention and Perception: Attention and perception normal.  Mood and Affect: Mood is anxious and depressed. Affect is tearful.        Speech: Speech normal.        Behavior: Behavior is withdrawn. Behavior is cooperative.        Thought Content: Thought content includes suicidal ideation.        Cognition and Memory: Cognition and memory normal.   Review of Systems  Neurological:  Positive for tremors, focal weakness and weakness.  Psychiatric/Behavioral:  Positive for depression, substance abuse (alcohol use, last drank 1 month ago) and suicidal ideas (suicide attempt). The patient is nervous/anxious and has insomnia (takes [place]).   All other systems reviewed and are negative. Blood pressure 140/75, pulse 92, temperature 98.1 F (36.7 C), temperature source Oral, resp. rate 18, height 5' 10"  (1.778 m), weight 63.9 kg, SpO2 98 %. Body mass index is 20.21 kg/m.   Treatment Plan Summary: Daily contact with patient to assess and evaluate symptoms and progress in treatment, Medication management, and Plan     Recommended for inpatient psychiatric treatment. Patient unable to contract for safety.  -Patient is to remain under involuntary commitment at this time, as he continues to be a danger to himself and unable to contract for safety.   -Will continue Abilify 5 mg mg p.o. nightly to target psychosis, depressive symptoms, anxiety, and suicidal ideations.  continue BuSpar 15 mg p.o. 3 times daily. -Will continue Klonopin 0.5 mg p.o. daily every morning; and Klonopin 1 mg p.o. nightly. Insomnia-Patient continues to endorse poor sleep habits recommend documenting sleep hours byt night shift. Patient may benefit from Propranolol for management of insomnia, anxiety  and tremors. -He is medically stable to discharge to inpatient psychiatric facility. Patient has been declined at multiple facilities will recommend referral to [facility] at this time. Patient with chronic suicidality, multiple suicide attempts of high lethality, no safe disposition plan at this time. WIll continue to work towards disposition for this patient will likely require long term care and supervision.   Disposition: Recommend psychiatric Inpatient admission when medically cleared. IVC  Suella Broad, FNP 07/20/2021 4:02 PM

## 2021-07-20 NOTE — Progress Notes (Signed)
PROGRESS NOTE    ELWYN KLOSINSKI  DXA:[id number] DOB: [birth year] DOA: 07/01/2021 PCP: Sandi Mariscal, MD   Brief Narrative: [name] is a 66 year old male with history of EtOH abuse, ankylosing spondylitis, anxiety, depression, history of colitis and chronic diarrhea, chronic headaches, chronic pain, HTN who presented to the hospital after intentional overdose of approximately 60 tablets of duloxetine.  There were multiple ER visits since September 2022 for anxiety, suicidal ideation, pain, and other issues.  He had been concerned with becoming homeless and endorsed wanting to "die because of this".  He was admitted to the hospital for further work-up as well as poison control evaluation and psychiatry assessment. He was incidentally found to be positive for COVID-19 as well. He is now medically stable and awaiting behavioral health admission.   Assessment & Plan:   Assessment and Plan: * Antidepressant overdose-resolved as of 07/12/2021, (present on admission) Monitored while inpatient. Consulted with poison control on admission  Alcohol abuse- (present on admission) Noted.  Suicide attempt [facility]) Prior to admission. Patient took Cymbalta. Psychiatry consulted and have recommended inpatient behavioral health admission. Medically stable for discharge.  Gastroesophageal reflux disease- (present on admission) -Continue protonix   Hyponatremia- (present on admission) Mild. History of SIADH. Stable.  Major depression, recurrent ([place])- (present on admission) -Psychiatry recommendations: Buspar 15 mg TID, Klonopin 0.5 mg qAM and 1 mg qHS  Essential hypertension- (present on admission) -Continue lisinopril  Allergic reaction-resolved as of 07/20/2021 Unsure of etiology. Occurred on 2/3. Resolved with Benadryl, Pepcid and Solu-medrol.  COVID-19 virus infection-resolved as of 07/11/2021 Diagnosed on 07/01/21. Incidental. Patient treated with molnupiravir and completed course. Isolation  discontinued.    DVT prophylaxis: Lovenox Code Status:   Code Status: Full Code Family Communication: None at bedside Disposition Plan: Discharge to behavioral health hospital once a bed is available. Medically stable for discharge.   Consultants:  Psychiatry  Procedures:  None  Antimicrobials: Molnupiravir     Subjective: Patient reports feeling anxious and having continued tremors  Objective: Vitals:   07/19/21 2033 07/20/21 0540 07/20/21 1105 07/20/21 1338  BP: (!) 142/79 133/89 137/78 140/75  Pulse: 89 94 95 92  Resp: 20 20  18   Temp: 98 F (36.7 C) (!) 97.4 F (36.3 C) (!) 97.4 F (36.3 C) 98.1 F (36.7 C)  TempSrc: Oral Oral Oral Oral  SpO2: 98% 97% 97% 98%  Weight:      Height:        Intake/Output Summary (Last 24 hours) at 07/20/2021 1910 Last data filed at 07/20/2021 1730 Gross per 24 hour  Intake 940 ml  Output --  Net 940 ml   Filed Weights   07/01/21 0649 07/03/21 0845  Weight: 63.5 kg 63.9 kg    Examination:  General exam: Appears calm and comfortable Respiratory system: Clear to auscultation. Respiratory effort normal. Cardiovascular system: S1 & S2 heard, RRR. No murmurs, rubs, gallops or clicks. Gastrointestinal system: Abdomen is nondistended, soft and nontender. No organomegaly or masses felt. Normal bowel sounds heard. Central nervous system: Alert and oriented. Right hand tremor > left Musculoskeletal: No edema. No calf tenderness Skin: No cyanosis. No rashes   Data Reviewed: I have personally reviewed following labs and imaging studies  CBC Lab Results  Component Value Date   WBC 8.4 07/18/2021   RBC 3.73 (L) 07/18/2021   HGB 11.9 (L) 07/18/2021   HCT 35.5 (L) 07/18/2021   MCV 95.2 07/18/2021   MCH 31.9 07/18/2021   PLT 331 07/18/2021   MCHC  33.5 07/18/2021   RDW 13.2 07/18/2021   LYMPHSABS 2.7 07/18/2021   MONOABS 1.0 07/18/2021   EOSABS 0.1 07/18/2021   BASOSABS 0.1 77/08/4033     Last metabolic panel Lab  Results  Component Value Date   NA 134 (L) 07/20/2021   K 3.8 07/20/2021   CL 97 (L) 07/20/2021   CO2 30 07/20/2021   BUN 11 07/20/2021   CREATININE 0.49 (L) 07/20/2021   GLUCOSE 98 07/20/2021   GFRNONAA >60 07/20/2021   GFRAA >60 11/21/2019   CALCIUM 8.9 07/20/2021   PHOS 2.8 12/13/2016   PROT 6.5 07/06/2021   ALBUMIN 3.7 07/06/2021   BILITOT 0.8 07/06/2021   ALKPHOS 59 07/06/2021   AST 20 07/06/2021   ALT 15 07/06/2021   ANIONGAP 7 07/20/2021    CBG (last 3)  No results for input(s): GLUCAP in the last 72 hours.   GFR: Estimated Creatinine Clearance: 83.2 mL/min (A) (by C-G formula based on SCr of 0.49 mg/dL (L)).  Coagulation Profile: No results for input(s): INR, PROTIME in the last 168 hours.  No results found for this or any previous visit (from the past 240 hour(s)).      Radiology Studies: No results found.      Scheduled Meds:  ARIPiprazole  5 mg Oral QHS   busPIRone  15 mg Oral TID   Chlorhexidine Gluconate Cloth  6 each Topical Daily   clonazePAM  0.5 mg Oral Q breakfast   And   clonazePAM  1 mg Oral QHS   enoxaparin (LOVENOX) injection  40 mg Subcutaneous Q24H   hydrocortisone   Rectal BID   lisinopril  30 mg Oral Daily   loratadine  10 mg Oral Daily   mouth rinse  15 mL Mouth Rinse BID   pantoprazole  40 mg Oral QAC breakfast   potassium chloride  20 mEq Oral BID   sodium chloride flush  10-40 mL Intracatheter Q12H   sucralfate  1 g Oral QID   zolpidem  5 mg Oral QHS   Continuous Infusions:  sodium chloride 10 mL/hr at 07/18/21 0551     LOS: 18 days     Cordelia Poche, MD [facility] 07/20/2021, 7:10 PM  If 7PM-7AM, please contact night-coverage [email or web address]

## 2021-07-21 DIAGNOSIS — T43202A Poisoning by unspecified antidepressants, intentional self-harm, initial encounter: Secondary | ICD-10-CM | POA: Diagnosis not present

## 2021-07-21 DIAGNOSIS — T50902A Poisoning by unspecified drugs, medicaments and biological substances, intentional self-harm, initial encounter: Secondary | ICD-10-CM | POA: Diagnosis not present

## 2021-07-21 DIAGNOSIS — I10 Essential (primary) hypertension: Secondary | ICD-10-CM | POA: Diagnosis not present

## 2021-07-21 DIAGNOSIS — R251 Tremor, unspecified: Secondary | ICD-10-CM

## 2021-07-21 MED ORDER — MIRTAZAPINE 15 MG PO TABS
7.5000 mg | ORAL_TABLET | Freq: Every day | ORAL | Status: DC
  Administered 2021-07-21 – 2021-08-10 (×21): 7.5 mg via ORAL
  Filled 2021-07-21 (×21): qty 1

## 2021-07-21 MED ORDER — PROPRANOLOL HCL 10 MG PO TABS
10.0000 mg | ORAL_TABLET | Freq: Three times a day (TID) | ORAL | Status: DC
  Administered 2021-07-21 – 2021-07-23 (×7): 10 mg via ORAL
  Filled 2021-07-21 (×7): qty 1

## 2021-07-21 NOTE — Progress Notes (Signed)
PROGRESS NOTE    Anthony Lamb  AJO:[id number] DOB: 02/21/1956 DOA: 07/01/2021 PCP: Sandi Mariscal, MD   Brief Narrative: Anthony Lamb is a 66 year old male with history of EtOH abuse, ankylosing spondylitis, anxiety, depression, history of colitis and chronic diarrhea, chronic headaches, chronic pain, HTN who presented to the hospital after intentional overdose of approximately 60 tablets of duloxetine.  There were multiple ER visits since September 2022 for anxiety, suicidal ideation, pain, and other issues.  He had been concerned with becoming homeless and endorsed wanting to "die because of this".  He was admitted to the hospital for further work-up as well as poison control evaluation and psychiatry assessment. He was incidentally found to be positive for COVID-19 as well. He is now medically stable and awaiting behavioral health admission.   Assessment & Plan:   Assessment and Plan: * Antidepressant overdose-resolved as of 07/12/2021, (present on admission) Monitored while inpatient. Consulted with poison control on admission  Alcohol abuse- (present on admission) Noted.  Tremor Chronic issue. -Will trial propranolol  Suicide attempt [facility]) Prior to admission. Patient took Cymbalta. Psychiatry consulted and have recommended inpatient behavioral health admission. Medically stable for discharge.  Gastroesophageal reflux disease- (present on admission) -Continue protonix   Hyponatremia- (present on admission) Mild. History of SIADH. Stable.  Major depression, recurrent ([place])- (present on admission) -Psychiatry recommendations: Buspar 15 mg TID, Klonopin 0.5 mg qAM and 1 mg qHS  Essential hypertension- (present on admission) -Continue lisinopril  Allergic reaction-resolved as of 07/20/2021 Unsure of etiology. Occurred on 2/3. Resolved with Benadryl, Pepcid and Solu-medrol.  COVID-19 virus infection-resolved as of 07/11/2021 Diagnosed on 07/01/21. Incidental. Patient treated with  molnupiravir and completed course. Isolation discontinued.    DVT prophylaxis: Lovenox Code Status:   Code Status: Full Code Family Communication: None at bedside Disposition Plan: Discharge to behavioral health hospital once a bed is available. Medically stable for discharge.   Consultants:  Psychiatry  Procedures:  None  Antimicrobials: Molnupiravir     Subjective: Continues to feel anxious and worries about his tremor.  Objective: Vitals:   07/20/21 2017 07/21/21 0442 07/21/21 0857 07/21/21 1250  BP: (!) 144/80 126/77 (!) 151/78 134/73  Pulse: 89 87 88 85  Resp: 18 18  16   Temp: 97.6 F (36.4 C) 97.8 F (36.6 C)  97.6 F (36.4 C)  TempSrc: Oral Oral  Oral  SpO2: 97% 96%  98%  Weight:      Height:        Intake/Output Summary (Last 24 hours) at 07/21/2021 1410 Last data filed at 07/21/2021 1140 Gross per 24 hour  Intake 940 ml  Output --  Net 940 ml    Filed Weights   07/01/21 0649 07/03/21 0845  Weight: 63.5 kg 63.9 kg    Examination:  General exam: Appears calm and comfortable Respiratory system: Clear to auscultation. Respiratory effort normal. Cardiovascular system: S1 & S2 heard, RRR. No murmurs, rubs, gallops or clicks. Gastrointestinal system: Abdomen is nondistended, soft and nontender. No organomegaly or masses felt. Normal bowel sounds heard. Central nervous system: Alert and oriented.  Musculoskeletal: No edema. No calf tenderness Skin: No cyanosis. No rashes   Data Reviewed: I have personally reviewed following labs and imaging studies  CBC Lab Results  Component Value Date   WBC 8.4 07/18/2021   RBC 3.73 (L) 07/18/2021   HGB 11.9 (L) 07/18/2021   HCT 35.5 (L) 07/18/2021   MCV 95.2 07/18/2021   MCH 31.9 07/18/2021   PLT 331 07/18/2021  MCHC 33.5 07/18/2021   RDW 13.2 07/18/2021   LYMPHSABS 2.7 07/18/2021   MONOABS 1.0 07/18/2021   EOSABS 0.1 07/18/2021   BASOSABS 0.1 91/63/8466     Last metabolic panel Lab Results   Component Value Date   NA 134 (L) 07/20/2021   K 3.8 07/20/2021   CL 97 (L) 07/20/2021   CO2 30 07/20/2021   BUN 11 07/20/2021   CREATININE 0.49 (L) 07/20/2021   GLUCOSE 98 07/20/2021   GFRNONAA >60 07/20/2021   GFRAA >60 11/21/2019   CALCIUM 8.9 07/20/2021   PHOS 2.8 12/13/2016   PROT 6.5 07/06/2021   ALBUMIN 3.7 07/06/2021   BILITOT 0.8 07/06/2021   ALKPHOS 59 07/06/2021   AST 20 07/06/2021   ALT 15 07/06/2021   ANIONGAP 7 07/20/2021    CBG (last 3)  No results for input(s): GLUCAP in the last 72 hours.   GFR: Estimated Creatinine Clearance: 83.2 mL/min (A) (by C-G formula based on SCr of 0.49 mg/dL (L)).  Coagulation Profile: No results for input(s): INR, PROTIME in the last 168 hours.  No results found for this or any previous visit (from the past 240 hour(s)).      Radiology Studies: No results found.      Scheduled Meds:  ARIPiprazole  5 mg Oral QHS   busPIRone  15 mg Oral TID   Chlorhexidine Gluconate Cloth  6 each Topical Daily   clonazePAM  0.5 mg Oral Q breakfast   And   clonazePAM  1 mg Oral QHS   enoxaparin (LOVENOX) injection  40 mg Subcutaneous Q24H   hydrocortisone   Rectal BID   lisinopril  30 mg Oral Daily   loratadine  10 mg Oral Daily   mouth rinse  15 mL Mouth Rinse BID   pantoprazole  40 mg Oral QAC breakfast   potassium chloride  20 mEq Oral BID   propranolol  10 mg Oral TID   sodium chloride flush  10-40 mL Intracatheter Q12H   sucralfate  1 g Oral QID   zolpidem  5 mg Oral QHS   Continuous Infusions:  sodium chloride 10 mL/hr at 07/18/21 0551     LOS: 19 days     Cordelia Poche, MD [facility] 07/21/2021, 2:10 PM  If 7PM-7AM, please contact night-coverage [email or web address]

## 2021-07-21 NOTE — Progress Notes (Signed)
°   07/21/21 1100  Mobility  Activity Ambulated with assistance in hallway  Level of Assistance Standby assist, set-up cues, supervision of patient - no hands on  Assistive Device Front wheel walker  Distance Ambulated (ft) 140 ft  Activity Response Tolerated well  $Mobility charge 1 Mobility   Pt agreeable to mobilize this morning. Ambulated about 134f in hall with RW, tolerated well. NT present. He did note some spinal pain and tenderness, as well as tremors while standing still. He seems to be frustrated that the tremors will not stop. Left pt in bed, call bell at side. RN/NT notified of session. MD present in room.   K[place] Park[facility]ist Acute Rehab Services Office: [phone]

## 2021-07-21 NOTE — Consult Note (Signed)
[place] Psychiatry Consult   Reason for Consult:  Suicide attempt by overdose on cymbalta Referring Physician:  Dr. Olevia Bowens Patient Identification: Anthony Lamb MRN:  [id number] Principal Diagnosis: Antidepressant overdose Diagnosis:  Active Problems:   Essential hypertension   Major depression, recurrent ([place])   Hyponatremia   Gastroesophageal reflux disease   Alcohol abuse   Suicide attempt ([place])   Tremor   Total Time spent with patient: 20 minutes  Subjective:   Anthony Lamb is a 66 y.o. male patient admitted with suicide attempt by overdose.  Patient presented after overdosing on (60) 60 mg tablets of Cymbalta, he subsequently developed serotonin syndrome and severe hyponatremia that resulted in admission to intensive care for hyponatremia protocol.  Patient is seen and reassessed by this nurse practitioner.  Case discussed with Dr. Lovette Cliche.  Patient is seen ambulatory with walker and safety sitter, walking down hallway.  He does brighten upon approach , and states " are you coming to see me ? "  Patient continued his walk down the unit, prior to returning to his room in which we were able to complete psychiatric reassessment.  As with previous assessments he continues to deny any improvement in his symptoms.  He states the only thing that has helped with his tremors and restlessness, was Ativan.  Long discussion had about long-term complications of alcohol use, in which he replies he consumed alcohol from the age of [age].  He continues to endorse poor sleeping habits, and ruminates about his medication changes.  When asking about his sleep schedule  "I did not sleep at all maybe 1 hour " .  However his Air cabin crew, reports patient slept from 12 AM to 5 AM.  When assessing for suicidality, he is very vague and states " I dont know but I cant live like this. "  He does continue to cite excessive worrying related to homelessness, throat cancer, medication adjustments.   In regards to medication changes he continues to perseverate about stopping his Klonopin, oxycodone, and Ambien.  He does agree to start mirtazapine for management of depression, anxiety, insomnia, and poor appetite.  He states he does not believe the BuSpar is effective, and recommends discontinuing the medication.   Patient is now medically stable discharge to inpatient general psych hospital for crisis stabilization, medication management, and behavior modification. Patient has been declined at multiple acute inpatient facilities, suspect underlying barrier is a diagnosis of dementia in the context of alcohol withdraw and Delirium tremens. Patient has a significant history of alcohol abuse, that was treated with  long term use of benzodiazepines which has now developed into severe dependency.   HPI:  Anthony Lamb is a 66 y.o. male with a past medical history significant for hypertension, GERD, peptic ulcer disease, ankylosing spondylitis, irritable bowel syndrome, chronic pain, anxiety, and depression who presents for suicide attempt by overdose.  According to EMS and patient, at 545 this morning, patient tried to overdose by taking 60 duloxetine tablets.  EMS thought that they were 60 mg tablets by report.  Patient reports she is feeling some waxing waning nausea, lightheadedness, and feels like he has no bowel movement.  He feels very dehydrated and tired.  He also is feeling very anxious and agitated.  He still says he wants to kill himself.  He reports that he was discharged from the emergency department yesterday at [facility] and he still says he is going to try to kill himself he leaves again today.  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 Nancy Fetter at [facility].  Past Medical History:  Past Medical History:  Diagnosis Date   Alcohol abuse, in remission    Anal fissure    Anemia    Ankylosing spondylitis (HCC)    Anxiety    Arthritis    Chronic diarrhea    Chronic headaches    Chronic pain    Colitis    Colon polyps    Depression    Esophagitis    Gastric polyps    hyperplastic and fundic gland   Gastritis    GERD (gastroesophageal reflux disease)    Hypertension    IBS (irritable bowel syndrome)    Poor dentition    SIADH (syndrome of inappropriate ADH production) ([place])     Past Surgical History:  Procedure Laterality Date   BIOPSY  11/26/2018   Procedure: BIOPSY;  Surgeon: Gatha Mayer, MD;  Location: [facility];  Service: Endoscopy;;   COLONOSCOPY W/ BIOPSIES     COLONOSCOPY WITH PROPOFOL N/A 11/26/2018   Procedure: COLONOSCOPY WITH PROPOFOL;  Surgeon: Gatha Mayer, MD;  Location: [facility];  Service: Endoscopy;  Laterality: N/A;   ESOPHAGOGASTRODUODENOSCOPY     ESOPHAGOGASTRODUODENOSCOPY (EGD) WITH PROPOFOL N/A 11/26/2018   Procedure: ESOPHAGOGASTRODUODENOSCOPY (EGD) WITH PROPOFOL;  Surgeon: Gatha Mayer, MD;  Location: [facility];  Service: Endoscopy;  Laterality: N/A;   HEMOSTASIS CLIP PLACEMENT  11/26/2018   Procedure: HEMOSTASIS CLIP PLACEMENT;  Surgeon: Gatha Mayer, MD;  Location: [facility];  Service: Endoscopy;;   HERNIA REPAIR Bilateral    HOT HEMOSTASIS N/A 11/26/2018   Procedure: HOT HEMOSTASIS (ARGON PLASMA COAGULATION/BICAP);  Surgeon: Gatha Mayer, MD;  Location: [facility];  Service: Endoscopy;  Laterality: N/A;   OPEN REDUCTION INTERNAL FIXATION (ORIF) DISTAL RADIAL FRACTURE Right 02/11/2018   Procedure: OPEN REDUCTION INTERNAL FIXATION (ORIF) DISTAL RADIAL FRACTURE;  Surgeon: Milly Jakob, MD;  Location: [facility];  Service: Orthopedics;  Laterality: Right;   POLYPECTOMY  11/26/2018   Procedure: POLYPECTOMY;  Surgeon:  Gatha Mayer, MD;  Location: [facility];  Service: Endoscopy;;   TONSILLECTOMY     Family History:  Family History  Problem Relation Age of Onset   Anxiety disorder Mother    Congestive Heart Failure Mother    Crohn's disease Mother    Colon cancer Mother 4   Arthritis Father    High blood pressure Father    Crohn's disease Father    Family Psychiatric  History: Denies Social History:  Social History   Substance and Sexual Activity  Alcohol Use Not Currently     Social History   Substance and Sexual Activity  Drug Use Not Currently   Types: Marijuana    Social History   Socioeconomic History   Marital status: Single    Spouse name: Not on file   Number of children: 0   Years of education: Not on file   Highest education level: Not on file  Occupational History   Not on file  Tobacco Use   Smoking status: Former    Types: Cigarettes    Quit date: 2014    Years since quitting: 9.1   Smokeless tobacco: Never  Vaping Use   Vaping Use: Never used  Substance and  Sexual Activity   Alcohol use: Not Currently   Drug use: Not Currently    Types: Marijuana   Sexual activity: Not Currently  Other Topics Concern   Not on file  Social History Narrative   HSG. Long - term disability - unable to work. Lived with his mother in her house - she died 04/24/2023 -    Lives in apartment   Has case worker   Social Determinants of Radio broadcast assistant Strain: Not on file  Food Insecurity: Not on file  Transportation Needs: Not on file  Physical Activity: Not on file  Stress: Not on file  Social Connections: Not on file   Additional Social History:    Allergies:   Allergies  Allergen Reactions   Nsaids Other (See Comments)    GI upset- history of peptic ulcers!!   Diphenhydramine Hcl Other (See Comments)    Restlessness   Flexeril [Cyclobenzaprine] Other (See Comments)    Restlessness   Fluoxetine Other (See Comments)    Made the patient feel "worse  than before" (treatment)   Hctz [Hydrochlorothiazide] Other (See Comments)    Caused to lose sodium when taking with Lisinopril   Rexulti [Brexpiprazole] Other (See Comments)    "Made me not feel right- restless"   Seroquel [Quetiapine] Other (See Comments)    Restless legs and makes the patient sweat- also does not help patient's insomnia. Pt takes this at home in 2023.   Gabapentin Diarrhea   Hydroxyzine Other (See Comments)    Restlessness    Labs:  Results for orders placed or performed during the hospital encounter of 07/01/21 (from the past 48 hour(s))  Basic metabolic panel     Status: Abnormal   Collection Time: 07/20/21  3:15 AM  Result Value Ref Range   Sodium 134 (L) 135 - 145 mmol/L   Potassium 3.8 3.5 - 5.1 mmol/L   Chloride 97 (L) 98 - 111 mmol/L   CO2 30 22 - 32 mmol/L   Glucose, Bld 98 70 - 99 mg/dL    Comment: Glucose reference range applies only to samples taken after fasting for at least 8 hours.   BUN 11 8 - 23 mg/dL   Creatinine, Ser 0.49 (L) 0.61 - 1.24 mg/dL   Calcium 8.9 8.9 - 10.3 mg/dL   GFR, Estimated >60 >60 mL/min    Comment: (NOTE) Calculated using the CKD-EPI Creatinine Equation (2021)    Anion gap 7 5 - 15    Comment: Performed at [facility], [place] [address] [place], [place] [address]     Current Facility-Administered Medications  Medication Dose Route Frequency Provider Last Rate Last Admin   0.9 %  sodium chloride infusion   Intravenous PRN Dwyane Dee, MD 10 mL/hr at 07/18/21 0551 New Bag at 07/18/21 0551   acetaminophen (TYLENOL) tablet 650 mg  650 mg Oral Q4H PRN Dwyane Dee, MD   650 mg at 07/21/21 1253   alum & mag hydroxide-simeth (MAALOX/MYLANTA) 200-200-20 MG/5ML suspension 30 mL  30 mL Oral Q4H PRN Dwyane Dee, MD   30 mL at 07/15/21 1122   ARIPiprazole (ABILIFY) tablet 5 mg  5 mg Oral QHS Suella Broad, FNP   5 mg at 07/20/21 2124   Chlorhexidine Gluconate Cloth 2 % PADS 6 each  6 each Topical  Daily Dwyane Dee, MD   6 each at 07/21/21 0859   clonazePAM (KLONOPIN) tablet 0.5 mg  0.5 mg Oral Q breakfast Suella Broad,  FNP   0.5 mg at 07/21/21 1157   And   clonazePAM (KLONOPIN) tablet 1 mg  1 mg Oral QHS Suella Broad, FNP   1 mg at 07/20/21 2125   diphenhydrAMINE-zinc acetate (BENADRYL) 2-0.1 % cream   Topical TID PRN Dwyane Dee, MD   Given at 07/18/21 1148   enoxaparin (LOVENOX) injection 40 mg  40 mg Subcutaneous Q24H Dwyane Dee, MD   40 mg at 07/20/21 2127   hydrALAZINE (APRESOLINE) tablet 25 mg  25 mg Oral Q4H PRN Dwyane Dee, MD   25 mg at 07/13/21 1701   hydrocortisone (ANUSOL-HC) 2.5 % rectal cream   Rectal BID Jennye Boroughs, MD   Given at 07/21/21 0930   hydrOXYzine (ATARAX) tablet 25 mg  25 mg Oral Q4H PRN Dwyane Dee, MD   25 mg at 07/20/21 1339   labetalol (NORMODYNE) injection 10 mg  10 mg Intravenous Q4H PRN Dwyane Dee, MD   10 mg at 07/13/21 1209   lisinopril (ZESTRIL) tablet 30 mg  30 mg Oral Daily Dwyane Dee, MD   30 mg at 07/21/21 2620   loperamide (IMODIUM) capsule 2 mg  2 mg Oral PRN Phillips Grout, MD   2 mg at 07/15/21 1734   loratadine (CLARITIN) tablet 10 mg  10 mg Oral Daily Manuella Ghazi, Pratik D, DO   10 mg at 07/21/21 3559   MEDLINE mouth rinse  15 mL Mouth Rinse BID Dwyane Dee, MD   15 mL at 07/21/21 0859   mirtazapine (REMERON) tablet 7.5 mg  7.5 mg Oral QHS Starkes-Perry, Gayland Curry, FNP       naphazoline-pheniramine (NAPHCON-A) 0.025-0.3 % ophthalmic solution 1 drop  1 drop Both Eyes QID PRN Manuella Ghazi, Pratik D, DO   1 drop at 07/19/21 1750   oxyCODONE (Oxy IR/ROXICODONE) immediate release tablet 5 mg  5 mg Oral Q12H PRN Manuella Ghazi, Pratik D, DO   5 mg at 07/21/21 0929   pantoprazole (PROTONIX) EC tablet 40 mg  40 mg Oral QAC breakfast Dwyane Dee, MD   40 mg at 07/21/21 0858   phenol (CHLORASEPTIC) mouth spray 1 spray  1 spray Mouth/Throat PRN Dwyane Dee, MD   1 spray at 07/11/21 2054   polyethylene glycol (MIRALAX /  GLYCOLAX) packet 17 g  17 g Oral Daily PRN Dwyane Dee, MD   17 g at 07/06/21 7416   polyvinyl alcohol (LIQUIFILM TEARS) 1.4 % ophthalmic solution 1 drop  1 drop Both Eyes PRN Jennye Boroughs, MD   1 drop at 07/17/21 2106   potassium chloride SA (KLOR-CON M) CR tablet 20 mEq  20 mEq Oral BID Derrill Kay A, MD   20 mEq at 07/21/21 0858   propranolol (INDERAL) tablet 10 mg  10 mg Oral TID Mariel Aloe, MD       sodium chloride (OCEAN) 0.65 % nasal spray 1 spray  1 spray Each Nare PRN Dwyane Dee, MD   1 spray at 07/17/21 0429   sodium chloride flush (NS) 0.9 % injection 10-40 mL  10-40 mL Intracatheter Q12H Dwyane Dee, MD   10 mL at 07/20/21 3845   sodium chloride flush (NS) 0.9 % injection 10-40 mL  10-40 mL Intracatheter PRN Dwyane Dee, MD       sucralfate (CARAFATE) tablet 1 g  1 g Oral QID Dwyane Dee, MD   1 g at 07/21/21 1253   zolpidem (AMBIEN) tablet 5 mg  5 mg Oral Standley Brooking, MD   5 mg at 07/20/21  2125    Musculoskeletal: Strength & Muscle Tone: within normal limits Gait & Station: normal Patient leans: N/A     Psychiatric Specialty Exam:  Presentation  General Appearance: Appropriate for Environment; Casual  Eye Contact:Fair  Speech:Clear and Coherent; Normal Rate  Speech Volume:Normal  Handedness:Right   Mood and Affect  Mood:Depressed  Affect:Appropriate; Congruent   Thought Process  Thought Processes:Coherent; Linear  Descriptions of Associations:Intact  Orientation:Full (Time, Place and Person)  Thought Content:Logical  History of Schizophrenia/Schizoaffective disorder:No  Duration of Psychotic Symptoms:N/A  Hallucinations:Hallucinations: None  Ideas of Reference:None  Suicidal Thoughts:Suicidal Thoughts: No  Homicidal Thoughts:Homicidal Thoughts: No   Sensorium  Memory:Immediate Fair; Recent Fair; Remote Fair  Judgment:Fair  Insight:Fair   Executive Functions  Concentration:Fair  Attention  Span:Fair  [place]   Psychomotor Activity  Psychomotor Activity:Psychomotor Activity: Restlessness; Tremor   Assets  Assets:Communication Skills; Financial Resources/Insurance; Housing; Social Support; Physical Health   Sleep  Sleep:Sleep: Poor   Physical Exam: Physical Exam Vitals and nursing note reviewed.  Constitutional:      Appearance: Normal appearance. He is normal weight.  HENT:     Head: Normocephalic.  Eyes:     General: Lids are normal.  Neurological:     General: No focal deficit present.     Mental Status: He is alert and oriented to person, place, and time. Mental status is at baseline.     Motor: Tremor present. No atrophy.  Psychiatric:        Attention and Perception: Attention and perception normal.        Mood and Affect: Mood is anxious and depressed. Affect is tearful.        Speech: Speech normal.        Behavior: Behavior is withdrawn. Behavior is cooperative.        Thought Content: Thought content includes suicidal ideation.        Cognition and Memory: Cognition and memory normal.   Review of Systems  Neurological:  Positive for tremors, focal weakness and weakness.  Psychiatric/Behavioral:  Positive for depression, substance abuse (alcohol use, last drank 1 month ago) and suicidal ideas (suicide attempt). The patient is nervous/anxious and has insomnia (takes [place]).   All other systems reviewed and are negative. Blood pressure 134/73, pulse 85, temperature 97.6 F (36.4 C), temperature source Oral, resp. rate 16, height 5' 10"  (1.778 m), weight 63.9 kg, SpO2 98 %. Body mass index is 20.21 kg/m.   Treatment Plan Summary: Daily contact with patient to assess and evaluate symptoms and progress in treatment, Medication management, and Plan     Recommended for inpatient psychiatric treatment. Patient unable to contract for safety.  -Patient is to remain under involuntary commitment at this time,  as he continues to be a danger to himself and unable to contract for safety.   -Will continue Abilify 5 mg mg p.o. nightly to target psychosis, depressive symptoms, anxiety, and suicidal ideations.  D/C BuSpar 15 mg p.o. 3 times daily. -Will continue Klonopin 0.5 mg p.o. daily every morning; and Klonopin 1 mg p.o. nightly. -Will start mirtazapine 7.5 mg p.o. nightly.  If mirtazapine proven to be effective will begin to decrease his Ambien dose. -Patient was also started on propranolol 10 mg p.o. 3 times daily. Insomnia-Patient continues to endorse poor sleep habits recommend documenting sleep hours byt night shift. -He is medically stable to discharge to inpatient psychiatric facility. Patient has been declined at multiple facilities will recommend referral to [facility]  at this time. Patient with chronic suicidality, multiple suicide attempts of high lethality, no safe disposition plan at this time. WIll continue to work towards disposition for this patient will likely require long term care and supervision.   Disposition: Recommend psychiatric Inpatient admission when medically cleared. IVC  Suella Broad, FNP 07/21/2021 3:32 PM

## 2021-07-21 NOTE — TOC Progression Note (Signed)
Transition of Care [facility]) - Progression Note    Patient Details  Name: Anthony Lamb MRN: [id number] Date of Birth: December 30, 1955  Transition of Care [facility] CM/SW [place], [place] Phone Number: 07/21/2021, 4:37 PM  Clinical Narrative:   FAXed referral to [facility].  Called to confirm arrival and gave verbal referral.  They will review for gero and let me know when he is on wait list. TOC will continue to follow during the course of hospitalization.          Expected Discharge Plan and Services                                                 Social Determinants of Health (SDOH) Interventions    Readmission Risk Interventions No flowsheet data found.

## 2021-07-21 NOTE — Assessment & Plan Note (Addendum)
Continue Inderal as above.

## 2021-07-22 ENCOUNTER — Inpatient Hospital Stay (HOSPITAL_COMMUNITY): Payer: Medicare Other

## 2021-07-22 DIAGNOSIS — T43202A Poisoning by unspecified antidepressants, intentional self-harm, initial encounter: Secondary | ICD-10-CM | POA: Diagnosis not present

## 2021-07-22 DIAGNOSIS — T1491XA Suicide attempt, initial encounter: Secondary | ICD-10-CM | POA: Diagnosis not present

## 2021-07-22 DIAGNOSIS — T50902A Poisoning by unspecified drugs, medicaments and biological substances, intentional self-harm, initial encounter: Secondary | ICD-10-CM | POA: Diagnosis not present

## 2021-07-22 DIAGNOSIS — R251 Tremor, unspecified: Secondary | ICD-10-CM | POA: Diagnosis not present

## 2021-07-22 DIAGNOSIS — R079 Chest pain, unspecified: Secondary | ICD-10-CM

## 2021-07-22 DIAGNOSIS — I10 Essential (primary) hypertension: Secondary | ICD-10-CM | POA: Diagnosis not present

## 2021-07-22 MED ORDER — HYDROXYZINE HCL 25 MG PO TABS
25.0000 mg | ORAL_TABLET | ORAL | Status: DC | PRN
  Administered 2021-07-22 – 2021-07-28 (×10): 25 mg via ORAL
  Filled 2021-07-22 (×10): qty 1

## 2021-07-22 NOTE — Plan of Care (Signed)
°  Problem: Education: Goal: Knowledge of risk factors and measures for prevention of condition will improve Outcome: Progressing   Problem: Coping: Goal: Psychosocial and spiritual needs will be supported Outcome: Progressing   Problem: Respiratory: Goal: Will maintain a patent airway Outcome: Progressing Goal: Complications related to the disease process, condition or treatment will be avoided or minimized Outcome: Progressing   Problem: Activity: Goal: Will identify at least one activity in which they can participate Outcome: Progressing   Problem: Coping: Goal: Ability to identify and develop effective coping behavior will improve Outcome: Progressing Goal: Ability to interact with others will improve Outcome: Progressing Goal: Demonstration of participation in decision-making regarding own care will improve Outcome: Progressing Goal: Ability to use eye contact when communicating with others will improve Outcome: Progressing   Problem: Health Behavior/Discharge Planning: Goal: Identification of resources available to assist in meeting health care needs will improve Outcome: Progressing   Problem: Self-Concept: Goal: Will verbalize positive feelings about self Outcome: Progressing   Problem: Education: Goal: Knowledge of General Education information will improve Description: Including pain rating scale, medication(s)/side effects and non-pharmacologic comfort measures Outcome: Progressing   Problem: Health Behavior/Discharge Planning: Goal: Ability to manage health-related needs will improve Outcome: Progressing   Problem: Clinical Measurements: Goal: Ability to maintain clinical measurements within normal limits will improve Outcome: Progressing Goal: Will remain free from infection Outcome: Progressing Goal: Diagnostic test results will improve Outcome: Progressing Goal: Respiratory complications will improve Outcome: Progressing Goal: Cardiovascular  complication will be avoided Outcome: Progressing   Problem: Activity: Goal: Risk for activity intolerance will decrease Outcome: Progressing   Problem: Nutrition: Goal: Adequate nutrition will be maintained Outcome: Progressing   Problem: Coping: Goal: Level of anxiety will decrease Outcome: Progressing   Problem: Elimination: Goal: Will not experience complications related to bowel motility Outcome: Progressing   Problem: Pain Managment: Goal: General experience of comfort will improve Outcome: Progressing   Problem: Safety: Goal: Ability to remain free from injury will improve Outcome: Progressing   Problem: Skin Integrity: Goal: Risk for impaired skin integrity will decrease Outcome: Progressing

## 2021-07-22 NOTE — TOC Progression Note (Addendum)
Transition of Care [facility]) - Progression Note    Patient Details  Name: Anthony Lamb MRN: [id number] Date of Birth: 1955-11-21  Transition of Care [facility]) CM/SW [place], [place] Phone Number: 07/22/2021, 4:07 PM  Clinical Narrative:   Called [facility].  Patient not yet on wait list as he is still "under review."  IVC due today.  Findings and Custody order signed by Lynnea Ferrier.  Called dispatch to request serving of IVC paperwork. TOC will continue to follow during the course of hospitalization.  Addendum:  Spoke with patient about recent psych hospitalizations.  The only one in the last year is [facility].  As such, he is not eligible for referral to ACT team.           Expected Discharge Plan and Services                                                 Social Determinants of Health (SDOH) Interventions    Readmission Risk Interventions No flowsheet data found.

## 2021-07-22 NOTE — Consult Note (Signed)
[facility] Psychiatry Consult   Reason for Consult:  Suicide attempt by overdose on cymbalta Referring Physician:  Dr. Olevia Bowens Patient Identification: Anthony Lamb MRN:  [id number] Principal Diagnosis: Antidepressant overdose Diagnosis:  Active Problems:   Essential hypertension   Major depression, recurrent ([place])   Hyponatremia   Chest pain   Gastroesophageal reflux disease   Alcohol abuse   Suicide attempt ([place])   Tremor   Total Time spent with patient: 20 minutes  Subjective:   Anthony Lamb is a 66 y.o. male patient admitted with suicide attempt by overdose.  Patient presented after overdosing on (60) 60 mg tablets of Cymbalta, he subsequently developed serotonin syndrome and severe hyponatremia that resulted in admission to intensive care for hyponatremia protocol.  Patient is seen and reassessed by this nurse practitioner.  Case discussed with Dr. Lovette Cliche. Patient continues to deny any new changes or improvement. When assessing his mood he states " not good. I think Im dying. They sent me for a chest xray because I was having chest pain and now the board they placed behind my back has my back hurting. I need some pain medicine. Please don't adjust my anymore of my medications. "   He continues to endorse frustration about restlessness, tremors and anxiety. He is unable to rate his anxiety, and request ativan be added back. We reviewed his current medication profile for anxiety medications, in addition to the trial medications we have used recently.  When assessing his sleep he states " it took me 3 hours to go to sleep but I slept pretty well. I needed that [place]. Please dont take that ambien away. "  He continues to endorse passive suicidal ideations, although nursing reports patient was endorsing active suicidal ideations with no plan last night. He denies any new side effects, adverse reactions, muscle stiffness, confusion or disorientation at this time. WIll continue  to recommend inpatient psych at this time.   Patient is now medically stable discharge to inpatient general psych hospital for crisis stabilization, medication management, and behavior modification. Patient has been declined at multiple acute inpatient facilities, suspect underlying barrier is a diagnosis of dementia in the context of alcohol withdraw and Delirium tremens. Patient has a significant history of alcohol abuse, that was treated with  long term use of benzodiazepines which has now developed into severe dependency.   HPI:  Anthony Lamb is a 66 y.o. male with a past medical history significant for hypertension, GERD, peptic ulcer disease, ankylosing spondylitis, irritable bowel syndrome, chronic pain, anxiety, and depression who presents for suicide attempt by overdose.  According to EMS and patient, at 545 this morning, patient tried to overdose by taking 60 duloxetine tablets.  EMS thought that they were 60 mg tablets by report.  Patient reports she is feeling some waxing waning nausea, lightheadedness, and feels like he has no bowel movement.  He feels very dehydrated and tired.  He also is feeling very anxious and agitated.  He still says he wants to kill himself.  He reports that he was discharged from the emergency department yesterday at [facility] and he still says he is going to try to kill himself he leaves again today.  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 Nancy Fetter [facility].  Past Medical History:  Past Medical History:  Diagnosis Date   Alcohol abuse, in remission    Anal fissure    Anemia    Ankylosing spondylitis (HCC)    Anxiety    Arthritis    Chronic  diarrhea    Chronic headaches    Chronic pain    Colitis    Colon polyps    Depression    Esophagitis    Gastric polyps    hyperplastic and fundic gland   Gastritis    GERD (gastroesophageal reflux disease)    Hypertension    IBS (irritable bowel syndrome)    Poor dentition    SIADH (syndrome of inappropriate ADH production) ([place])     Past Surgical History:  Procedure Laterality Date   BIOPSY  11/26/2018   Procedure: BIOPSY;  Surgeon: Gatha Mayer, MD;  Location: [facility];  Service: Endoscopy;;   COLONOSCOPY W/ BIOPSIES     COLONOSCOPY WITH PROPOFOL N/A 11/26/2018   Procedure: COLONOSCOPY WITH PROPOFOL;  Surgeon: Gatha Mayer, MD;  Location: [facility];  Service: Endoscopy;  Laterality: N/A;   ESOPHAGOGASTRODUODENOSCOPY     ESOPHAGOGASTRODUODENOSCOPY (EGD) WITH PROPOFOL N/A 11/26/2018   Procedure: ESOPHAGOGASTRODUODENOSCOPY (EGD) WITH PROPOFOL;  Surgeon: Gatha Mayer, MD;  Location: [facility];  Service: Endoscopy;  Laterality: N/A;   HEMOSTASIS CLIP PLACEMENT  11/26/2018   Procedure: HEMOSTASIS CLIP PLACEMENT;  Surgeon: Gatha Mayer, MD;  Location: [facility];  Service: Endoscopy;;   HERNIA REPAIR Bilateral    HOT HEMOSTASIS N/A 11/26/2018   Procedure: HOT HEMOSTASIS (ARGON PLASMA COAGULATION/BICAP);  Surgeon: Gatha Mayer, MD;  Location[facility];  Service: Endoscopy;  Laterality: N/A;   OPEN REDUCTION INTERNAL FIXATION (ORIF) DISTAL RADIAL FRACTURE Right 02/11/2018   Procedure: OPEN REDUCTION INTERNAL FIXATION (ORIF) DISTAL RADIAL FRACTURE;  Surgeon: Milly Jakob, MD;  Location: [facility];  Service: Orthopedics;  Laterality: Right;   POLYPECTOMY  11/26/2018   Procedure: POLYPECTOMY;  Surgeon: Gatha Mayer, MD;  Location: [facility];  Service: Endoscopy;;   TONSILLECTOMY     Family History:  Family History  Problem Relation Age of Onset   Anxiety disorder Mother    Congestive Heart Failure Mother    Crohn's disease Mother    Colon cancer Mother 31    Arthritis Father    High blood pressure Father    Crohn's disease Father    Family Psychiatric  History: Denies Social History:  Social History   Substance and Sexual Activity  Alcohol Use Not Currently     Social History   Substance and Sexual Activity  Drug Use Not Currently   Types: Marijuana    Social History   Socioeconomic History   Marital status: Single    Spouse name: Not on file   Number of children: 0   Years of education: Not on file   Highest education level: Not on file  Occupational History   Not on file  Tobacco Use   Smoking status: Former    Types: Cigarettes    Quit date: 2014    Years since quitting: 9.1   Smokeless tobacco: Never  Vaping Use   Vaping Use: Never used  Substance and Sexual Activity   Alcohol use: Not Currently   Drug use: Not Currently    Types: Marijuana   Sexual activity: Not Currently  Other Topics Concern   Not on file  Social History Narrative   HSG. Long - term disability - unable to work.  Lived with his mother in her house - she died 2023-04-22 -    Lives in apartment   Has case worker   Social Determinants of Radio broadcast assistant Strain: Not on file  Food Insecurity: Not on file  Transportation Needs: Not on file  Physical Activity: Not on file  Stress: Not on file  Social Connections: Not on file   Additional Social History:    Allergies:   Allergies  Allergen Reactions   Nsaids Other (See Comments)    GI upset- history of peptic ulcers!!   Diphenhydramine Hcl Other (See Comments)    Restlessness   Flexeril [Cyclobenzaprine] Other (See Comments)    Restlessness   Fluoxetine Other (See Comments)    Made the patient feel "worse than before" (treatment)   Hctz [Hydrochlorothiazide] Other (See Comments)    Caused to lose sodium when taking with Lisinopril   Rexulti [Brexpiprazole] Other (See Comments)    "Made me not feel right- restless"   Seroquel [Quetiapine] Other (See Comments)    Restless  legs and makes the patient sweat- also does not help patient's insomnia. Pt takes this at home in 2023.   Gabapentin Diarrhea   Hydroxyzine Other (See Comments)    Restlessness    Labs:  No results found for this or any previous visit (from the past 48 hour(s)).    Current Facility-Administered Medications  Medication Dose Route Frequency Provider Last Rate Last Admin   0.9 %  sodium chloride infusion   Intravenous PRN Dwyane Dee, MD 10 mL/hr at 07/18/21 0551 New Bag at 07/18/21 0551   acetaminophen (TYLENOL) tablet 650 mg  650 mg Oral Q4H PRN Dwyane Dee, MD   650 mg at 07/22/21 1418   alum & mag hydroxide-simeth (MAALOX/MYLANTA) 200-200-20 MG/5ML suspension 30 mL  30 mL Oral Q4H PRN Dwyane Dee, MD   30 mL at 07/22/21 0850   ARIPiprazole (ABILIFY) tablet 5 mg  5 mg Oral QHS Suella Broad, FNP   5 mg at 07/21/21 2118   Chlorhexidine Gluconate Cloth 2 % PADS 6 each  6 each Topical Daily Dwyane Dee, MD   6 each at 07/22/21 1884   clonazePAM (KLONOPIN) tablet 0.5 mg  0.5 mg Oral Q breakfast Suella Broad, FNP   0.5 mg at 07/22/21 1660   And   clonazePAM (KLONOPIN) tablet 1 mg  1 mg Oral QHS Suella Broad, FNP   1 mg at 07/21/21 2118   diphenhydrAMINE-zinc acetate (BENADRYL) 2-0.1 % cream   Topical TID PRN Dwyane Dee, MD   Given at 07/18/21 1148   enoxaparin (LOVENOX) injection 40 mg  40 mg Subcutaneous Q24H Dwyane Dee, MD   40 mg at 07/21/21 2126   hydrALAZINE (APRESOLINE) tablet 25 mg  25 mg Oral Q4H PRN Dwyane Dee, MD   25 mg at 07/13/21 1701   hydrocortisone (ANUSOL-HC) 2.5 % rectal cream   Rectal BID Jennye Boroughs, MD   Given at 07/22/21 6301   hydrOXYzine (ATARAX) tablet 25 mg  25 mg Oral Q4H PRN Dwyane Dee, MD   25 mg at 07/22/21 1418   labetalol (NORMODYNE) injection 10 mg  10 mg Intravenous Q4H PRN Dwyane Dee, MD   10 mg at 07/13/21 1209   lisinopril (ZESTRIL) tablet 30 mg  30 mg Oral Daily Dwyane Dee, MD   30 mg at  07/22/21 6010   loperamide (IMODIUM) capsule 2 mg  2 mg Oral PRN Phillips Grout, MD   2  mg at 07/15/21 1734   loratadine (CLARITIN) tablet 10 mg  10 mg Oral Daily Heath Lark D, DO   10 mg at 07/22/21 5409   MEDLINE mouth rinse  15 mL Mouth Rinse BID Dwyane Dee, MD   15 mL at 07/22/21 8119   mirtazapine (REMERON) tablet 7.5 mg  7.5 mg Oral QHS Suella Broad, FNP   7.5 mg at 07/21/21 2118   naphazoline-pheniramine (NAPHCON-A) 0.025-0.3 % ophthalmic solution 1 drop  1 drop Both Eyes QID PRN Heath Lark D, DO   1 drop at 07/19/21 1750   oxyCODONE (Oxy IR/ROXICODONE) immediate release tablet 5 mg  5 mg Oral Q12H PRN Manuella Ghazi, Pratik D, DO   5 mg at 07/22/21 0850   pantoprazole (PROTONIX) EC tablet 40 mg  40 mg Oral QAC breakfast Dwyane Dee, MD   40 mg at 07/22/21 1478   phenol (CHLORASEPTIC) mouth spray 1 spray  1 spray Mouth/Throat PRN Dwyane Dee, MD   1 spray at 07/11/21 2054   polyethylene glycol (MIRALAX / GLYCOLAX) packet 17 g  17 g Oral Daily PRN Dwyane Dee, MD   17 g at 07/06/21 2956   polyvinyl alcohol (LIQUIFILM TEARS) 1.4 % ophthalmic solution 1 drop  1 drop Both Eyes PRN Jennye Boroughs, MD   1 drop at 07/17/21 2106   potassium chloride SA (KLOR-CON M) CR tablet 20 mEq  20 mEq Oral BID Phillips Grout, MD   20 mEq at 07/22/21 2130   propranolol (INDERAL) tablet 10 mg  10 mg Oral TID Mariel Aloe, MD   10 mg at 07/22/21 8657   sodium chloride (OCEAN) 0.65 % nasal spray 1 spray  1 spray Each Nare PRN Dwyane Dee, MD   1 spray at 07/17/21 0429   sodium chloride flush (NS) 0.9 % injection 10-40 mL  10-40 mL Intracatheter Q12H Dwyane Dee, MD   10 mL at 07/22/21 8469   sodium chloride flush (NS) 0.9 % injection 10-40 mL  10-40 mL Intracatheter PRN Dwyane Dee, MD       sucralfate (CARAFATE) tablet 1 g  1 g Oral QID Dwyane Dee, MD   1 g at 07/22/21 1418   zolpidem (AMBIEN) tablet 5 mg  5 mg Oral Standley Brooking, MD   5 mg at 07/21/21 2117     Musculoskeletal: Strength & Muscle Tone: within normal limits Gait & Station: normal Patient leans: N/A     Psychiatric Specialty Exam:  Presentation  General Appearance: Appropriate for Environment; Casual  Eye Contact:Fair  Speech:Clear and Coherent; Normal Rate  Speech Volume:Normal  Handedness:Right   Mood and Affect  Mood:Depressed  Affect:Appropriate; Congruent   Thought Process  Thought Processes:Coherent; Linear  Descriptions of Associations:Intact  Orientation:Full (Time, Place and Person)  Thought Content:Logical  History of Schizophrenia/Schizoaffective disorder:No  Duration of Psychotic Symptoms:N/A  Hallucinations:No data recorded  Ideas of Reference:None  Suicidal Thoughts:No data recorded  Homicidal Thoughts:No data recorded   Sensorium  Memory:Immediate Fair; Recent Fair; Remote Fair  Judgment:Fair  Insight:Fair   Executive Functions  Concentration:Fair  Attention Span:Fair  [place]   Psychomotor Activity  Psychomotor Activity:No data recorded   Assets  Assets:Communication Skills; Financial Resources/Insurance; Housing; Social Support; Physical Health   Sleep  Sleep:No data recorded   Physical Exam: Physical Exam Vitals and nursing note reviewed.  Constitutional:      Appearance: Normal appearance. He is normal weight.  HENT:     Head: Normocephalic.  Eyes:  General: Lids are normal.  Neurological:     General: No focal deficit present.     Mental Status: He is alert and oriented to person, place, and time. Mental status is at baseline.     Motor: Tremor present. No atrophy.  Psychiatric:        Attention and Perception: Attention and perception normal.        Mood and Affect: Mood is anxious and depressed. Affect is tearful.        Speech: Speech normal.        Behavior: Behavior is withdrawn. Behavior is cooperative.        Thought Content: Thought  content includes suicidal ideation.        Cognition and Memory: Cognition and memory normal.   Review of Systems  Neurological:  Positive for tremors, focal weakness and weakness.  Psychiatric/Behavioral:  Positive for depression, substance abuse (alcohol use, last drank 1 month ago) and suicidal ideas (suicide attempt). The patient is nervous/anxious and has insomnia (takes [place]).   All other systems reviewed and are negative. Blood pressure 129/75, pulse 72, temperature 98 F (36.7 C), resp. rate 18, height 5' 10"  (1.778 m), weight 63.9 kg, SpO2 98 %. Body mass index is 20.21 kg/m.   Treatment Plan Summary: Daily contact with patient to assess and evaluate symptoms and progress in treatment, Medication management, and Plan     Recommended for inpatient psychiatric treatment. Patient unable to contract for safety.  -Patient is to remain under involuntary commitment at this time, as he continues to be a danger to himself and unable to contract for safety.   -Will continue Abilify 5 mg mg p.o. nightly to target psychosis, depressive symptoms, anxiety, and suicidal ideations.  -Will continue Klonopin 0.5 mg p.o. daily every morning; and Klonopin 1 mg p.o. nightly. -Will continue  mirtazapine 7.5 mg p.o. nightly.  If mirtazapine proven to be effective will begin to decrease his Ambien dose. -Patient was also started on propranolol 10 mg p.o. 3 times daily. Insomnia-Patient continues to endorse poor sleep habits recommend documenting sleep hours byt night shift. -He is medically stable to discharge to inpatient psychiatric facility. Patient has been declined at multiple facilities will recommend referral to [facility] at this time. Patient with chronic suicidality, multiple suicide attempts of high lethality, no safe disposition plan at this time. WIll continue to work towards disposition for this patient will likely require long term care and supervision.   Disposition: Recommend psychiatric  Inpatient admission when medically cleared. IVC  Suella Broad, FNP 07/22/2021 3:26 PM

## 2021-07-22 NOTE — Assessment & Plan Note (Addendum)
Not typical. Multiple EKG normal. No further evaluation.

## 2021-07-22 NOTE — Progress Notes (Signed)
Patient complaining of chest pain that radiates to his back. 8 out of 10 pain on pain scale. Patient said his chest feels heavy. Vitals signs are stable. MD paged, made aware. PRN medication given for pain, indigestion and anxiety. Will reassess and continue to monitor patient.

## 2021-07-22 NOTE — Progress Notes (Signed)
PROGRESS NOTE    Anthony Lamb  GXQ:[id number] DOB: 1955/11/12 DOA: 07/01/2021 PCP: Sandi Mariscal, MD   Brief Narrative: Anthony Lamb is a 66 year old male with history of EtOH abuse, ankylosing spondylitis, anxiety, depression, history of colitis and chronic diarrhea, chronic headaches, chronic pain, HTN who presented to the hospital after intentional overdose of approximately 60 tablets of duloxetine.  There were multiple ER visits since September 2022 for anxiety, suicidal ideation, pain, and other issues.  He had been concerned with becoming homeless and endorsed wanting to "die because of this".  He was admitted to the hospital for further work-up as well as poison control evaluation and psychiatry assessment. He was incidentally found to be positive for COVID-19 as well. He is now medically stable and awaiting behavioral health admission.   Assessment and Plan: * Antidepressant overdose-resolved as of 07/12/2021, (present on admission) Monitored while inpatient. Consulted with poison control on admission  Alcohol abuse- (present on admission) Noted.  Tremor Chronic issue. Started propranolol -Continue propranolol, titrate as needed/tolerated  Suicide attempt [facility]) Prior to admission. Patient took Cymbalta. Psychiatry consulted and have recommended inpatient behavioral health admission. Medically stable for discharge.  Gastroesophageal reflux disease- (present on admission) -Continue protonix   Chest pain Not typical. EKG normal. Reproducible. No associated dyspnea. Likely heavily contributed to by anxiety. Possibly musculoskeletal since there is an aspect of reproducibility. Also with RUQ abdominal/lower chest pain. -Chest x-ray; abdominal x-ray -Analgesics prn  Hyponatremia- (present on admission) Mild. History of SIADH. Stable.  Major depression, recurrent ([place])- (present on admission) -Psychiatry recommendations: Buspar 15 mg TID, Klonopin 0.5 mg qAM and 1 mg  qHS  Essential hypertension- (present on admission) -Continue lisinopril  Allergic reaction-resolved as of 07/20/2021 Unsure of etiology. Occurred on 2/3. Resolved with Benadryl, Pepcid and Solu-medrol.  COVID-19 virus infection-resolved as of 07/11/2021 Diagnosed on 07/01/21. Incidental. Patient treated with molnupiravir and completed course. Isolation discontinued.    DVT prophylaxis: Lovenox Code Status:   Code Status: Full Code Family Communication: None at bedside Disposition Plan: Discharge to behavioral health hospital once a bed is available. Medically stable for discharge.   Consultants:  Psychiatry  Procedures:  None  Antimicrobials: Molnupiravir     Subjective: Right sided chest pain in addition to upper abdominal pain. Sharp. Associated symptoms include continued shaking per patient, although he has a chronic tremor. No associated tachycardia or hypoxia. Patient states that he has trouble breathing because he was told his diaphragm does not work properly. He is also concerned about his gallbladder. He also voiced concerns that psychiatry was going to discontinue his Ambien.  Objective: Vitals:   07/21/21 2058 07/21/21 2101 07/22/21 0622 07/22/21 0834  BP: (!) 143/72  140/88 140/82  Pulse: 79  85 75  Resp: 18  20 18   Temp: 99.9 F (37.7 C) 98.5 F (36.9 C) (!) 97.5 F (36.4 C) 98.4 F (36.9 C)  TempSrc: Oral Oral Oral Oral  SpO2: 97%  98% 98%  Weight:      Height:        Intake/Output Summary (Last 24 hours) at 07/22/2021 1143 Last data filed at 07/22/2021 0759 Gross per 24 hour  Intake 478 ml  Output --  Net 478 ml    Filed Weights   07/01/21 0649 07/03/21 0845  Weight: 63.5 kg 63.9 kg    Examination:  General exam: Appears uncomfortable/anxious Respiratory system: Clear to auscultation. Respiratory effort normal. Cardiovascular system: S1 & S2 heard, RRR. No murmurs, rubs, gallops or clicks. Gastrointestinal system:  Abdomen is nondistended,  soft and nontender. No organomegaly or masses felt. Normal bowel sounds heard. Central nervous system: Alert and oriented. Right upper extremity tremor Musculoskeletal: No edema. No calf tenderness Skin: No cyanosis. No rashes Psychiatry: Anxious   Data Reviewed: I have personally reviewed following labs and imaging studies  CBC Lab Results  Component Value Date   WBC 8.4 07/18/2021   RBC 3.73 (L) 07/18/2021   HGB 11.9 (L) 07/18/2021   HCT 35.5 (L) 07/18/2021   MCV 95.2 07/18/2021   MCH 31.9 07/18/2021   PLT 331 07/18/2021   MCHC 33.5 07/18/2021   RDW 13.2 07/18/2021   LYMPHSABS 2.7 07/18/2021   MONOABS 1.0 07/18/2021   EOSABS 0.1 07/18/2021   BASOSABS 0.1 12/75/1700     Last metabolic panel Lab Results  Component Value Date   NA 134 (L) 07/20/2021   K 3.8 07/20/2021   CL 97 (L) 07/20/2021   CO2 30 07/20/2021   BUN 11 07/20/2021   CREATININE 0.49 (L) 07/20/2021   GLUCOSE 98 07/20/2021   GFRNONAA >60 07/20/2021   GFRAA >60 11/21/2019   CALCIUM 8.9 07/20/2021   PHOS 2.8 12/13/2016   PROT 6.5 07/06/2021   ALBUMIN 3.7 07/06/2021   BILITOT 0.8 07/06/2021   ALKPHOS 59 07/06/2021   AST 20 07/06/2021   ALT 15 07/06/2021   ANIONGAP 7 07/20/2021    CBG (last 3)  No results for input(s): GLUCAP in the last 72 hours.   GFR: Estimated Creatinine Clearance: 83.2 mL/min (A) (by C-G formula based on SCr of 0.49 mg/dL (L)).  Coagulation Profile: No results for input(s): INR, PROTIME in the last 168 hours.  No results found for this or any previous visit (from the past 240 hour(s)).      Radiology Studies: No results found.      Scheduled Meds:  ARIPiprazole  5 mg Oral QHS   Chlorhexidine Gluconate Cloth  6 each Topical Daily   clonazePAM  0.5 mg Oral Q breakfast   And   clonazePAM  1 mg Oral QHS   enoxaparin (LOVENOX) injection  40 mg Subcutaneous Q24H   hydrocortisone   Rectal BID   lisinopril  30 mg Oral Daily   loratadine  10 mg Oral Daily   mouth  rinse  15 mL Mouth Rinse BID   mirtazapine  7.5 mg Oral QHS   pantoprazole  40 mg Oral QAC breakfast   potassium chloride  20 mEq Oral BID   propranolol  10 mg Oral TID   sodium chloride flush  10-40 mL Intracatheter Q12H   sucralfate  1 g Oral QID   zolpidem  5 mg Oral QHS   Continuous Infusions:  sodium chloride 10 mL/hr at 07/18/21 0551     LOS: 20 days     Cordelia Poche, MD [facility] 07/22/2021, 11:43 AM  If 7PM-7AM, please contact night-coverage [email or web address]

## 2021-07-23 DIAGNOSIS — T43202A Poisoning by unspecified antidepressants, intentional self-harm, initial encounter: Secondary | ICD-10-CM | POA: Diagnosis not present

## 2021-07-23 DIAGNOSIS — R251 Tremor, unspecified: Secondary | ICD-10-CM | POA: Diagnosis not present

## 2021-07-23 DIAGNOSIS — I10 Essential (primary) hypertension: Secondary | ICD-10-CM | POA: Diagnosis not present

## 2021-07-23 DIAGNOSIS — T1491XA Suicide attempt, initial encounter: Secondary | ICD-10-CM | POA: Diagnosis not present

## 2021-07-23 MED ORDER — PROPRANOLOL HCL 20 MG PO TABS
20.0000 mg | ORAL_TABLET | Freq: Three times a day (TID) | ORAL | Status: DC
  Administered 2021-07-23 – 2021-08-22 (×86): 20 mg via ORAL
  Filled 2021-07-23 (×90): qty 1

## 2021-07-23 NOTE — Progress Notes (Signed)
PROGRESS NOTE    Anthony Lamb  XIP:[id number] DOB: 13-Mar-1956 DOA: 07/01/2021 PCP: Sandi Mariscal, MD   Brief Narrative: Anthony Lamb is a 66 year old male with history of EtOH abuse, ankylosing spondylitis, anxiety, depression, history of colitis and chronic diarrhea, chronic headaches, chronic pain, HTN who presented to the hospital after intentional overdose of approximately 60 tablets of duloxetine.  There were multiple ER visits since September 2022 for anxiety, suicidal ideation, pain, and other issues.  He had been concerned with becoming homeless and endorsed wanting to "die because of this".  He was admitted to the hospital for further work-up as well as poison control evaluation and psychiatry assessment. He was incidentally found to be positive for COVID-19 as well. He is now medically stable and awaiting behavioral health admission.   Assessment and Plan: * Antidepressant overdose-resolved as of 07/12/2021, (present on admission) Monitored while inpatient. Consulted with poison control on admission  Alcohol abuse- (present on admission) Noted.  Tremor Chronic issue. Started propranolol -Continue propranolol, titrate as needed/tolerated  Suicide attempt [facility]) Prior to admission. Patient took Cymbalta. Psychiatry consulted and have recommended inpatient behavioral health admission. Medically stable for discharge.  Gastroesophageal reflux disease- (present on admission) -Continue protonix   Chest pain Not typical. EKG normal. Reproducible. No associated dyspnea. Likely heavily contributed to by anxiety. Possibly musculoskeletal since there is an aspect of reproducibility. Also with RUQ abdominal/lower chest pain. Imaging unremarkable. Pain is improved.  Hyponatremia- (present on admission) Mild. History of SIADH. Stable.  Major depression, recurrent ([place])- (present on admission) -Psychiatry recommendations: Buspar 15 mg TID, Klonopin 0.5 mg qAM and 1 mg qHS  Essential  hypertension- (present on admission) -Continue lisinopril  Allergic reaction-resolved as of 07/20/2021 Unsure of etiology. Occurred on 2/3. Resolved with Benadryl, Pepcid and Solu-medrol.  COVID-19 virus infection-resolved as of 07/11/2021 Diagnosed on 07/01/21. Incidental. Patient treated with molnupiravir and completed course. Isolation discontinued.    DVT prophylaxis: Lovenox Code Status:   Code Status: Full Code Family Communication: None at bedside Disposition Plan: Discharge to behavioral health hospital once a bed is available. Medically stable for discharge.   Consultants:  Psychiatry  Procedures:  None  Antimicrobials: Molnupiravir     Subjective: Patient reports he is still shaking.  Objective: Vitals:   07/23/21 0659 07/23/21 0909 07/23/21 1453 07/23/21 1453  BP: 137/75 131/73 126/73 126/77  Pulse: 80 85 80 76  Resp: 16 18 20 20   Temp: 98 F (36.7 C) 98.9 F (37.2 C) 98.3 F (36.8 C) 98.1 F (36.7 C)  TempSrc: Oral Oral Oral Oral  SpO2: 98% 100% 100% 100%  Weight:      Height:        Intake/Output Summary (Last 24 hours) at 07/23/2021 1601 Last data filed at 07/23/2021 0830 Gross per 24 hour  Intake 1531.98 ml  Output --  Net 1531.98 ml    Filed Weights   07/01/21 0649 07/03/21 0845  Weight: 63.5 kg 63.9 kg    Examination:  General exam: Appears anxious Respiratory system: Respiratory effort normal. Gastrointestinal system: Abdomen is nondistended Central nervous system: Alert and oriented. No focal neurological deficits.   Data Reviewed: I have personally reviewed following labs and imaging studies  CBC Lab Results  Component Value Date   WBC 8.4 07/18/2021   RBC 3.73 (L) 07/18/2021   HGB 11.9 (L) 07/18/2021   HCT 35.5 (L) 07/18/2021   MCV 95.2 07/18/2021   MCH 31.9 07/18/2021   PLT 331 07/18/2021   MCHC 33.5 07/18/2021  RDW 13.2 07/18/2021   LYMPHSABS 2.7 07/18/2021   MONOABS 1.0 07/18/2021   EOSABS 0.1 07/18/2021    BASOSABS 0.1 15/72/6203     Last metabolic panel Lab Results  Component Value Date   NA 134 (L) 07/20/2021   K 3.8 07/20/2021   CL 97 (L) 07/20/2021   CO2 30 07/20/2021   BUN 11 07/20/2021   CREATININE 0.49 (L) 07/20/2021   GLUCOSE 98 07/20/2021   GFRNONAA >60 07/20/2021   GFRAA >60 11/21/2019   CALCIUM 8.9 07/20/2021   PHOS 2.8 12/13/2016   PROT 6.5 07/06/2021   ALBUMIN 3.7 07/06/2021   BILITOT 0.8 07/06/2021   ALKPHOS 59 07/06/2021   AST 20 07/06/2021   ALT 15 07/06/2021   ANIONGAP 7 07/20/2021    CBG (last 3)  No results for input(s): GLUCAP in the last 72 hours.   GFR: Estimated Creatinine Clearance: 83.2 mL/min (A) (by C-G formula based on SCr of 0.49 mg/dL (L)).  Coagulation Profile: No results for input(s): INR, PROTIME in the last 168 hours.  No results found for this or any previous visit (from the past 240 hour(s)).      Radiology Studies: DG CHEST PORT 1 VIEW  Result Date: 07/22/2021 CLINICAL DATA:  Abdominal pain EXAM: PORTABLE CHEST 1 VIEW COMPARISON:  Radiograph 07/01/2021 FINDINGS: Right upper extremity PICC tip overlies the mid superior vena cava. Unchanged cardiomediastinal silhouette. No focal airspace consolidation. No pleural effusion. No pneumothorax. Skin fold overlies the left upper chest. There is no subdiaphragmatic free air on this semi erect radiograph. IMPRESSION: No evidence of acute cardiopulmonary disease. Electronically Signed   By: Maurine Simmering M.D.   On: 07/22/2021 12:03   DG Abd Portable 1V  Result Date: 07/22/2021 CLINICAL DATA:  Abdominal pain EXAM: PORTABLE ABDOMEN - 1 VIEW COMPARISON:  CT 06/04/2021 FINDINGS: There is no evidence of bowel obstruction. Mild colonic stool burden. No visible radiopaque calculi overlying the kidneys. No acute osseous abnormality. Fused spine and SI joints suggesting ankylosing spondylitis. IMPRESSION: No evidence of bowel obstruction. Electronically Signed   By: Maurine Simmering M.D.   On: 07/22/2021 12:06         Scheduled Meds:  ARIPiprazole  5 mg Oral QHS   Chlorhexidine Gluconate Cloth  6 each Topical Daily   clonazePAM  0.5 mg Oral Q breakfast   And   clonazePAM  1 mg Oral QHS   enoxaparin (LOVENOX) injection  40 mg Subcutaneous Q24H   lisinopril  30 mg Oral Daily   loratadine  10 mg Oral Daily   mouth rinse  15 mL Mouth Rinse BID   mirtazapine  7.5 mg Oral QHS   pantoprazole  40 mg Oral QAC breakfast   potassium chloride  20 mEq Oral BID   propranolol  10 mg Oral TID   sodium chloride flush  10-40 mL Intracatheter Q12H   sucralfate  1 g Oral QID   zolpidem  5 mg Oral QHS   Continuous Infusions:  sodium chloride 10 mL/hr at 07/23/21 0600     LOS: 21 days     Cordelia Poche, MD [facility] 07/23/2021, 4:01 PM  If 7PM-7AM, please contact night-coverage [email or web address]

## 2021-07-23 NOTE — Progress Notes (Signed)
°   07/23/21 1130  Mobility  Activity Ambulated with assistance in hallway;Ambulated with assistance in room  Range of Motion/Exercises Active;All extremities  Level of Assistance Minimal assist, patient does 75% or more  Assistive Device Front wheel walker  Distance Ambulated (ft) 350 ft  Activity Response Tolerated well  Transport method Ambulatory  $Mobility charge 1 Mobility   Pt agreeable to mobilize this morning. Ambulated about 317f in hall with RW, tolerated well. He stated ambulating exacerbated his spinal pain, and that his tremors make it difficult for him to sleep or rest in bed. Left pt in bed, call bell at side. RN/NT notified of session.

## 2021-07-24 DIAGNOSIS — F13280 Sedative, hypnotic or anxiolytic dependence with sedative, hypnotic or anxiolytic-induced anxiety disorder: Secondary | ICD-10-CM | POA: Diagnosis not present

## 2021-07-24 DIAGNOSIS — F332 Major depressive disorder, recurrent severe without psychotic features: Secondary | ICD-10-CM | POA: Diagnosis not present

## 2021-07-24 DIAGNOSIS — T43202D Poisoning by unspecified antidepressants, intentional self-harm, subsequent encounter: Secondary | ICD-10-CM | POA: Diagnosis not present

## 2021-07-24 DIAGNOSIS — F1010 Alcohol abuse, uncomplicated: Secondary | ICD-10-CM | POA: Diagnosis not present

## 2021-07-24 DIAGNOSIS — T43204D Poisoning by unspecified antidepressants, undetermined, subsequent encounter: Secondary | ICD-10-CM

## 2021-07-24 DIAGNOSIS — I10 Essential (primary) hypertension: Secondary | ICD-10-CM | POA: Diagnosis not present

## 2021-07-24 LAB — BASIC METABOLIC PANEL
Anion gap: 5 (ref 5–15)
BUN: 10 mg/dL (ref 8–23)
CO2: 31 mmol/L (ref 22–32)
Calcium: 8.9 mg/dL (ref 8.9–10.3)
Chloride: 98 mmol/L (ref 98–111)
Creatinine, Ser: 0.62 mg/dL (ref 0.61–1.24)
GFR, Estimated: >60 mL/min (ref >60)
Glucose, Bld: 90 mg/dL (ref 70–99)
Potassium: 3.9 mmol/L (ref 3.5–5.1)
Sodium: 134 mmol/L — ABNORMAL LOW (ref 135–145)

## 2021-07-24 NOTE — Consult Note (Signed)
Brief Psychiatry Consult Note  The patient was last seen by the psychiatry service on 2/17. Interim documentation by primary team and nursing staff has been reviewed. disturbance requiring ongoing psychiatric consultation. Please see last consult note for full assessment. On exam today, pt presented similarly to last several consults by NP. He perseverated on his anxiety and was difficult to redirect to other topics. He asked me to reassure him that I would not be reducing doses of his multiple controlled substances 4-5 times during conversation; the last time I told him I would not be answering that question again (countertherapeutic to indulge pt checking behavior) and he interestingly did not ask again. Unfortunately has become attached to oxycodone which was started this admission. Continues to complain of tremor (does appear to have some tremor R>L at baseline, but amplitude increases when pt talks about it). Told me he was no longer worried about bing homeless as he has a group he thinks will help him ([employer]?). Endorses no side effects from mirtazapine, stating that "it helps me sleep as long as I still get my Ambien". Overall agree with NP assessment - pt with MDD/recurrent/severe and a sedative-hypnotic use disorder. Are tapering BZD slowly due to physiologic dependence.   - No med changes today. See NP note for full plan.   Angelito Hopping A Morna Flud

## 2021-07-24 NOTE — Progress Notes (Signed)
°  Progress Note   Patient: Anthony Lamb [id number]:[id number] DOB: 1955/08/03 DOA: 07/01/2021     22 DOS: the patient was seen and examined on 07/24/2021   Brief hospital course: Anthony Lamb is a 66 year old male with history of EtOH abuse, ankylosing spondylitis, anxiety, depression, history of colitis and chronic diarrhea, chronic headaches, chronic pain, HTN who presented to the hospital after intentional overdose of approximately 60 tablets of duloxetine.  There were multiple ER visits since September 2022 for anxiety, suicidal ideation, pain, and other issues.  He had been concerned with becoming homeless and endorsed wanting to "die because of this".  He was admitted to the hospital for further work-up as well as poison control evaluation and psychiatry assessment. He was incidentally found to be positive for COVID-19 as well. He is now medically stable and awaiting behavioral health admission.   No changes in plan or  management.  Pt reports he is anxious. Bowel movement this morning.  Already went for a walk.  Assessment and Plan: * Antidepressant overdose-resolved as of 07/12/2021, (present on admission) Monitored while inpatient. Consulted with poison control on admission  Tremor Chronic issue. Started propranolol -Continue propranolol, titrate as needed/tolerated  Suicide attempt [facility]) Prior to admission. Patient took Cymbalta. Psychiatry consulted and have recommended inpatient behavioral health admission. Medically stable for discharge.  Alcohol abuse- (present on admission) Noted.  Gastroesophageal reflux disease- (present on admission) -Continue protonix   Chest pain Not typical. EKG normal. Reproducible. No associated dyspnea. Likely heavily contributed to by anxiety. Possibly musculoskeletal since there is an aspect of reproducibility. Also with RUQ abdominal/lower chest pain. Imaging unremarkable. Pain is improved.  Hyponatremia- (present on admission) Mild. History of  SIADH. Stable.  Major depression, recurrent ([place])- (present on admission) -Psychiatry recommendations: Buspar 15 mg TID, Klonopin 0.5 mg qAM and 1 mg qHS  Essential hypertension- (present on admission) -Continue lisinopril  Allergic reaction-resolved as of 07/20/2021 Unsure of etiology. Occurred on 2/3. Resolved with Benadryl, Pepcid and Solu-medrol.  COVID-19 virus infection-resolved as of 07/11/2021 Diagnosed on 07/01/21. Incidental. Patient treated with molnupiravir and completed course. Isolation discontinued.        Subjective: no new complaints  Physical Exam: Vitals:   07/23/21 1453 07/23/21 2122 07/24/21 0613 07/24/21 1001  BP: 126/77 140/72 125/75 129/73  Pulse: 76 78 76 90  Resp: 20 20    Temp: 98.1 F (36.7 C) 98 F (36.7 C) 98.7 F (37.1 C)   TempSrc: Oral Oral Oral   SpO2: 100% 97% 97%   Weight:      Height:       General exam: tremulous but comfortable.  Respiratory system: Clear to auscultation. Respiratory effort normal. Cardiovascular system: S1 & S2 heard, RRR. No JVD,  No pedal edema. Gastrointestinal system: Abdomen is nondistended, soft and nontender.. Normal bowel sounds heard. Central nervous system: Alert and oriented to person and place Extremities: Symmetric 5 x 5 power. Skin: No rashes, lesions or ulcers Psychiatry:  anxious. .    Data Reviewed:  There are no new results to review at this time.  Family Communication: none at bedside  Disposition: Status is: Inpatient Remains inpatient appropriate because: unsafe d/c plan          Planned Discharge Destination:  pending     Time spent: 25 minutes  Author: Hosie Poisson, MD 07/24/2021 11:12 AM  For on call review [email or web address].

## 2021-07-25 DIAGNOSIS — I10 Essential (primary) hypertension: Secondary | ICD-10-CM | POA: Diagnosis not present

## 2021-07-25 DIAGNOSIS — T43202A Poisoning by unspecified antidepressants, intentional self-harm, initial encounter: Secondary | ICD-10-CM | POA: Diagnosis not present

## 2021-07-25 DIAGNOSIS — R251 Tremor, unspecified: Secondary | ICD-10-CM | POA: Diagnosis not present

## 2021-07-25 DIAGNOSIS — T1491XA Suicide attempt, initial encounter: Secondary | ICD-10-CM | POA: Diagnosis not present

## 2021-07-25 NOTE — Progress Notes (Signed)
PROGRESS NOTE    Anthony Lamb  MEB:[id number] DOB: 03-04-1956 DOA: 07/01/2021 PCP: Anthony Mariscal, MD   Brief Narrative: Anthony Lamb is a 66 year old male with history of EtOH abuse, ankylosing spondylitis, anxiety, depression, history of colitis and chronic diarrhea, chronic headaches, chronic pain, HTN who presented to the hospital after intentional overdose of approximately 60 tablets of duloxetine.  There were multiple ER visits since September 2022 for anxiety, suicidal ideation, pain, and other issues.  He had been concerned with becoming homeless and endorsed wanting to "die because of this".  He was admitted to the hospital for further work-up as well as poison control evaluation and psychiatry assessment. He was incidentally found to be positive for COVID-19 as well. He is now medically stable and awaiting behavioral health admission.   Assessment and Plan: * Antidepressant overdose-resolved as of 07/12/2021, (present on admission) Monitored while inpatient. Consulted with poison control on admission  Alcohol abuse- (present on admission) Noted.  Tremor Chronic issue. Started propranolol -Continue propranolol, titrate as needed/tolerated  Suicide attempt [facility]) Prior to admission. Patient took Cymbalta. Psychiatry consulted and have recommended inpatient behavioral health admission. Medically stable for discharge.  Gastroesophageal reflux disease- (present on admission) -Continue protonix   Hyponatremia- (present on admission) Mild. History of SIADH. Stable.  Major depression, recurrent (Anthony Lamb)- (present on admission) -Psychiatry recommendations: Buspar 15 mg TID, Klonopin 0.5 mg qAM and 1 mg qHS  Essential hypertension- (present on admission) -Continue lisinopril  Allergic reaction-resolved as of 07/20/2021 Unsure of etiology. Occurred on 2/3. Resolved with Benadryl, Pepcid and Solu-medrol.  COVID-19 virus infection-resolved as of 07/11/2021 Diagnosed on 07/01/21.  Incidental. Patient treated with molnupiravir and completed course. Isolation discontinued.  Chest pain-resolved as of 07/25/2021 Not typical. EKG normal. Reproducible. No associated dyspnea. Likely heavily contributed to by anxiety. Possibly musculoskeletal since there is an aspect of reproducibility. Also with RUQ abdominal/lower chest pain. Imaging unremarkable. Pain is improved.    DVT prophylaxis: Lovenox Code Status:   Code Status: Full Code Family Communication: None at bedside Disposition Plan: Discharge to behavioral health hospital once a bed is available. Medically stable for discharge.   Consultants:  Psychiatry  Procedures:  None  Antimicrobials: Molnupiravir     Subjective: Patient continues to report he can't stay still when lying down.   Objective: Vitals:   07/24/21 0613 07/24/21 1001 07/24/21 1340 07/24/21 2103  BP: 125/75 129/73 135/73 127/68  Pulse: 76 90 69 66  Resp:   20 20  Temp: 98.7 F (37.1 C)  97.6 F (36.4 C) 97.6 F (36.4 C)  TempSrc: Oral     SpO2: 97%  98% 98%  Weight:      Height:        Intake/Output Summary (Last 24 hours) at 07/25/2021 1149 Last data filed at 07/25/2021 0940 Gross per 24 hour  Intake 700 ml  Output --  Net 700 ml    Filed Weights   07/01/21 0649 07/03/21 0845  Weight: 63.5 kg 63.9 kg    Examination:  General exam: Appears anxious Respiratory system: Clear to auscultation. Respiratory effort normal. Cardiovascular system: S1 & S2 heard, RRR. No murmurs, rubs, gallops or clicks. Gastrointestinal system: Abdomen is nondistended, soft and nontender. No organomegaly or masses felt. Normal bowel sounds heard. Central nervous system: Alert and oriented. Musculoskeletal: No edema. No calf tenderness Skin: No cyanosis. No rashes   Data Reviewed: I have personally reviewed following labs and imaging studies  CBC Lab Results  Component Value Date   WBC  8.4 07/18/2021   RBC 3.73 (L) 07/18/2021   HGB 11.9 (L)  07/18/2021   HCT 35.5 (L) 07/18/2021   MCV 95.2 07/18/2021   MCH 31.9 07/18/2021   PLT 331 07/18/2021   MCHC 33.5 07/18/2021   RDW 13.2 07/18/2021   LYMPHSABS 2.7 07/18/2021   MONOABS 1.0 07/18/2021   EOSABS 0.1 07/18/2021   BASOSABS 0.1 80/99/8338     Last metabolic panel Lab Results  Component Value Date   NA 134 (L) 07/24/2021   K 3.9 07/24/2021   CL 98 07/24/2021   CO2 31 07/24/2021   BUN 10 07/24/2021   CREATININE 0.62 07/24/2021   GLUCOSE 90 07/24/2021   GFRNONAA >60 07/24/2021   GFRAA >60 11/21/2019   CALCIUM 8.9 07/24/2021   PHOS 2.8 12/13/2016   PROT 6.5 07/06/2021   ALBUMIN 3.7 07/06/2021   BILITOT 0.8 07/06/2021   ALKPHOS 59 07/06/2021   AST 20 07/06/2021   ALT 15 07/06/2021   ANIONGAP 5 07/24/2021    CBG (last 3)  No results for input(s): GLUCAP in the last 72 hours.   GFR: Estimated Creatinine Clearance: 83.2 mL/min (by C-G formula based on SCr of 0.62 mg/dL).  Coagulation Profile: No results for input(s): INR, PROTIME in the last 168 hours.  No results found for this or any previous visit (from the past 240 hour(s)).      Radiology Studies: No results found.      Scheduled Meds:  ARIPiprazole  5 mg Oral QHS   Chlorhexidine Gluconate Cloth  6 each Topical Daily   clonazePAM  0.5 mg Oral Q breakfast   And   clonazePAM  1 mg Oral QHS   enoxaparin (LOVENOX) injection  40 mg Subcutaneous Q24H   lisinopril  30 mg Oral Daily   loratadine  10 mg Oral Daily   mouth rinse  15 mL Mouth Rinse BID   mirtazapine  7.5 mg Oral QHS   pantoprazole  40 mg Oral QAC breakfast   potassium chloride  20 mEq Oral BID   propranolol  20 mg Oral TID   sodium chloride flush  10-40 mL Intracatheter Q12H   sucralfate  1 g Oral QID   zolpidem  5 mg Oral QHS   Continuous Infusions:  sodium chloride 10 mL/hr at 07/23/21 0600     LOS: 23 days     Cordelia Poche, MD [facility] 07/25/2021, 11:49 AM  If 7PM-7AM, please contact  night-coverage [email or web address]

## 2021-07-25 NOTE — Progress Notes (Signed)
PT Cancellation Note  Patient Details Name: Anthony Lamb MRN: [id number] DOB: [birth year]   Cancelled Treatment:     Pt has a Air cabin crew with him ans she has been amb pt in hallway several times with walker at Supervision level.     Rica Koyanagi  PTA Acute  Rehabilitation Services Pager      [phone] Office      [phone]

## 2021-07-25 NOTE — Consult Note (Signed)
[facility]iatry Consult   Reason for Consult:  Suicide attempt by overdose on cymbalta Referring Physician:  Dr. 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 male patient admitted with suicide attempt by overdose.  Patient presented after overdosing on (60) 60 mg tablets of Cymbalta, he subsequently developed serotonin syndrome and severe hyponatremia that resulted in admission to intensive care for hyponatremia protocol.  Patient is seen and reassessed by this nurse practitioner.  Case discussed with Dr. Lovette Cliche. Patient endorses some improvement in his symptoms.  He endorses anxiety and depression and anxiety rated them both 8 out of 10, with 10 being the worst on a Likert scale.  Despite his improvement in his symptoms, as it relates to daily medication management, and adjustments when necessary patient continues to beg not to have medications adjusted.  He remains focused on Klonopin, oxycodone, and Ambien.  He states that these medications are changed he will not be able to live.  He states that his symptoms are intolerable, which make him more suicidal.   Patient initially denied suicidal ideations, further stating he has not been suicidal since his overdose on Cymbalta 24 days ago.  Patient then retracts his statement " I lied.  I am still suicidal.  I just do not want to go to [facility] so I lied."  He continues to endorse ongoing passive suicidal ideations with no plan at this time.  He does endorse some improvement with the  mirtazapine 7.5 mg p.o. nightly, citing he is now sleeping well in combination with Ambien.  He continues to perseverate about the Ambien , and pleading not to discontinue this medication.  Patient is advised if medication continues to be effective we will look at making changes early next week in reference to discontinuing his Ambien.  Patient is lacking insight and unable to correlate inability to sleep prior to initiation of mirtazapine, as Ambien was no longer effective.  Patient denies any eating disturbances at this time, he is observed eating his lunch, in its entirety.  He denies any new side effects, adverse reactions, muscle stiffness, confusion or disorientation at this time. WIll continue to recommend inpatient psych at this time.   Patient is now medically stable discharge to inpatient general psych hospital for crisis stabilization, medication management, and behavior modification. Patient has been declined at multiple acute inpatient facilities, suspect underlying barrier is a diagnosis of dementia in the context of alcohol withdraw and Delirium tremens. Patient has a significant history of alcohol abuse, that was treated with  long term use of benzodiazepines which has now developed into severe dependency.   HPI:  Anthony Lamb is a 66 y.o. male with a past medical history significant for hypertension, GERD, peptic ulcer disease, ankylosing spondylitis, irritable bowel syndrome, chronic pain, anxiety, and depression who presents for suicide attempt by overdose.  According to EMS and patient, at 545 this morning, patient tried to overdose by taking 60 duloxetine tablets.  EMS thought that they were 60 mg tablets by report.  Patient reports she is feeling some waxing waning nausea,  lightheadedness, and feels like he has no bowel movement.  He feels very dehydrated and tired.  He also is feeling very anxious and agitated.  He still says he wants to kill himself.  He reports that he was  discharged from the emergency department yesterday at [facility] and he still says he is going to try to kill himself he leaves again today.  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 Nancy Fetter at [facility].  Past Medical History:  Past Medical History:  Diagnosis Date   Alcohol abuse, in remission    Anal fissure    Anemia    Ankylosing spondylitis (HCC)    Anxiety    Arthritis    Chronic diarrhea    Chronic headaches    Chronic pain    Colitis    Colon polyps    Depression    Esophagitis    Gastric polyps    hyperplastic and fundic gland   Gastritis    GERD (gastroesophageal reflux disease)    Hypertension    IBS (irritable bowel syndrome)    Poor dentition    SIADH (syndrome of inappropriate ADH production) ([place])     Past Surgical History:  Procedure Laterality Date   BIOPSY  11/26/2018   Procedure: BIOPSY;  Surgeon: Gatha Mayer, MD;  Location: [facility];  Service: Endoscopy;;   COLONOSCOPY W/ BIOPSIES     COLONOSCOPY WITH PROPOFOL N/A 11/26/2018   Procedure: COLONOSCOPY WITH PROPOFOL;  Surgeon: Gatha Mayer, MD;  Location: [facility];  Service: Endoscopy;  Laterality: N/A;   ESOPHAGOGASTRODUODENOSCOPY     ESOPHAGOGASTRODUODENOSCOPY (EGD) WITH PROPOFOL N/A 11/26/2018   Procedure: ESOPHAGOGASTRODUODENOSCOPY (EGD) WITH PROPOFOL;  Surgeon: Gatha Mayer, MD;  Location: [facility];  Service: Endoscopy;  Laterality: N/A;   HEMOSTASIS CLIP PLACEMENT  11/26/2018   Procedure: HEMOSTASIS CLIP PLACEMENT;  Surgeon: Gatha Mayer, MD;  Location: [facility];  Service: Endoscopy;;   HERNIA REPAIR Bilateral    HOT HEMOSTASIS N/A 11/26/2018   Procedure: HOT HEMOSTASIS  (ARGON PLASMA COAGULATION/BICAP);  Surgeon: Gatha Mayer, MD;  Location: [facility];  Service: Endoscopy;  Laterality: N/A;   OPEN REDUCTION INTERNAL FIXATION (ORIF) DISTAL RADIAL FRACTURE Right 02/11/2018   Procedure: OPEN REDUCTION INTERNAL FIXATION (ORIF) DISTAL RADIAL FRACTURE;  Surgeon: Milly Jakob, MD;  Location: [facility];  Service: Orthopedics;  Laterality: Right;   POLYPECTOMY  11/26/2018   Procedure: POLYPECTOMY;  Surgeon: Gatha Mayer, MD;  Location: [facility];  Service: Endoscopy;;   TONSILLECTOMY     Family History:  Family History  Problem Relation Age of Onset   Anxiety disorder Mother    Congestive Heart Failure Mother    Crohn's disease Mother    Colon cancer Mother 59   Arthritis Father    High blood pressure Father    Crohn's disease Father    Family Psychiatric  History: Denies Social History:  Social History   Substance and Sexual Activity  Alcohol Use Not Currently     Social History   Substance and Sexual Activity  Drug Use Not Currently   Types: Marijuana    Social History   Socioeconomic History   Marital status: Single    Spouse name: Not on file   Number of children: 0  Years of education: Not on file   Highest education level: Not on file  Occupational History   Not on file  Tobacco Use   Smoking status: Former    Types: Cigarettes    Quit date: 2014    Years since quitting: 9.1   Smokeless tobacco: Never  Vaping Use   Vaping Use: Never used  Substance and Sexual Activity   Alcohol use: Not Currently   Drug use: Not Currently    Types: Marijuana   Sexual activity: Not Currently  Other Topics Concern   Not on file  Social History Narrative   HSG. Long - term disability - unable to work. Lived with his mother in her house - she died May 08, 2023 -    Lives in apartment   Has case worker   Social Determinants of Radio broadcast assistant Strain: Not on file  Food Insecurity: Not on file  Transportation Needs: Not on file   Physical Activity: Not on file  Stress: Not on file  Social Connections: Not on file   Additional Social History:    Allergies:   Allergies  Allergen Reactions   Nsaids Other (See Comments)    GI upset- history of peptic ulcers!!   Diphenhydramine Hcl Other (See Comments)    Restlessness   Flexeril [Cyclobenzaprine] Other (See Comments)    Restlessness   Fluoxetine Other (See Comments)    Made the patient feel "worse than before" (treatment)   Hctz [Hydrochlorothiazide] Other (See Comments)    Caused to lose sodium when taking with Lisinopril   Rexulti [Brexpiprazole] Other (See Comments)    "Made me not feel right- restless"   Seroquel [Quetiapine] Other (See Comments)    Restless legs and makes the patient sweat- also does not help patient's insomnia. Pt takes this at home in 2023.   Gabapentin Diarrhea   Hydroxyzine Other (See Comments)    Restlessness    Labs:  Results for orders placed or performed during the hospital encounter of 07/01/21 (from the past 48 hour(s))  Basic metabolic panel     Status: Abnormal   Collection Time: 07/24/21  3:08 AM  Result Value Ref Range   Sodium 134 (L) 135 - 145 mmol/L   Potassium 3.9 3.5 - 5.1 mmol/L   Chloride 98 98 - 111 mmol/L   CO2 31 22 - 32 mmol/L   Glucose, Bld 90 70 - 99 mg/dL    Comment: Glucose reference range applies only to samples taken after fasting for at least 8 hours.   BUN 10 8 - 23 mg/dL   Creatinine, Ser 0.62 0.61 - 1.24 mg/dL   Calcium 8.9 8.9 - 10.3 mg/dL   GFR, Estimated >60 >60 mL/min    Comment: (NOTE) Calculated using the CKD-EPI Creatinine Equation (2021)    Anion gap 5 5 - 15    Comment: Performed at [facility], [place] [address] [place], [place] [address]      Current Facility-Administered Medications  Medication Dose Route Frequency Provider Last Rate Last Admin   0.9 %  sodium chloride infusion   Intravenous PRN Dwyane Dee, MD 10 mL/hr at 07/23/21 0600 Infusion Verify  at 07/23/21 0600   acetaminophen (TYLENOL) tablet 650 mg  650 mg Oral Q4H PRN Dwyane Dee, MD   650 mg at 07/25/21 0702   alum & mag hydroxide-simeth (MAALOX/MYLANTA) 200-200-20 MG/5ML suspension 30 mL  30 mL Oral Q4H PRN Dwyane Dee, MD   30 mL at 07/22/21 [phone]  ARIPiprazole (ABILIFY) tablet 5 mg  5 mg Oral QHS Suella Broad, FNP   5 mg at 07/24/21 2212   Chlorhexidine Gluconate Cloth 2 % PADS 6 each  6 each Topical Daily Dwyane Dee, MD   6 each at 07/24/21 1001   clonazePAM (KLONOPIN) tablet 0.5 mg  0.5 mg Oral Q breakfast Suella Broad, FNP   0.5 mg at 07/25/21 0998   And   clonazePAM (KLONOPIN) tablet 1 mg  1 mg Oral QHS Suella Broad, FNP   1 mg at 07/24/21 2212   diphenhydrAMINE-zinc acetate (BENADRYL) 2-0.1 % cream   Topical TID PRN Dwyane Dee, MD   Given at 07/18/21 1148   enoxaparin (LOVENOX) injection 40 mg  40 mg Subcutaneous Q24H Dwyane Dee, MD   40 mg at 07/24/21 2213   hydrALAZINE (APRESOLINE) tablet 25 mg  25 mg Oral Q4H PRN Dwyane Dee, MD   25 mg at 07/13/21 1701   hydrOXYzine (ATARAX) tablet 25 mg  25 mg Oral Q4H PRN Mariel Aloe, MD   25 mg at 07/25/21 0701   labetalol (NORMODYNE) injection 10 mg  10 mg Intravenous Q4H PRN Dwyane Dee, MD   10 mg at 07/13/21 1209   lisinopril (ZESTRIL) tablet 30 mg  30 mg Oral Daily Dwyane Dee, MD   30 mg at 07/25/21 0920   loperamide (IMODIUM) capsule 2 mg  2 mg Oral PRN Phillips Grout, MD   2 mg at 07/25/21 0704   loratadine (CLARITIN) tablet 10 mg  10 mg Oral Daily Manuella Ghazi, Pratik D, DO   10 mg at 07/25/21 3382   MEDLINE mouth rinse  15 mL Mouth Rinse BID Dwyane Dee, MD   15 mL at 07/25/21 0920   mirtazapine (REMERON) tablet 7.5 mg  7.5 mg Oral QHS Suella Broad, FNP   7.5 mg at 07/24/21 2211   naphazoline-pheniramine (NAPHCON-A) 0.025-0.3 % ophthalmic solution 1 drop  1 drop Both Eyes QID PRN Heath Lark D, DO   1 drop at 07/19/21 1750   oxyCODONE (Oxy IR/ROXICODONE)  immediate release tablet 5 mg  5 mg Oral Q12H PRN Manuella Ghazi, Pratik D, DO   5 mg at 07/25/21 1044   pantoprazole (PROTONIX) EC tablet 40 mg  40 mg Oral QAC breakfast Dwyane Dee, MD   40 mg at 07/25/21 0805   phenol (CHLORASEPTIC) mouth spray 1 spray  1 spray Mouth/Throat PRN Dwyane Dee, MD   1 spray at 07/11/21 2054   polyethylene glycol (MIRALAX / GLYCOLAX) packet 17 g  17 g Oral Daily PRN Dwyane Dee, MD   17 g at 07/06/21 5053   polyvinyl alcohol (LIQUIFILM TEARS) 1.4 % ophthalmic solution 1 drop  1 drop Both Eyes PRN Jennye Boroughs, MD   1 drop at 07/17/21 2106   potassium chloride SA (KLOR-CON M) CR tablet 20 mEq  20 mEq Oral BID Derrill Kay A, MD   20 mEq at 07/25/21 0920   propranolol (INDERAL) tablet 20 mg  20 mg Oral TID Mariel Aloe, MD   20 mg at 07/25/21 0920   sodium chloride (OCEAN) 0.65 % nasal spray 1 spray  1 spray Each Nare PRN Dwyane Dee, MD   1 spray at 07/25/21 0920   sodium chloride flush (NS) 0.9 % injection 10-40 mL  10-40 mL Intracatheter Q12H Dwyane Dee, MD   10 mL at 07/24/21 1001   sodium chloride flush (NS) 0.9 % injection 10-40 mL  10-40 mL Intracatheter PRN Dwyane Dee,  MD       sucralfate (CARAFATE) tablet 1 g  1 g Oral QID Dwyane Dee, MD   1 g at 07/25/21 0920   zolpidem (AMBIEN) tablet 5 mg  5 mg Oral Standley Brooking, MD   5 mg at 07/24/21 2212    Musculoskeletal: Strength & Muscle Tone: within normal limits Gait & Station: normal Patient leans: N/A     Psychiatric Specialty Exam:  Presentation  General Appearance: Appropriate for Environment; Casual  Eye Contact:Fair  Speech:Clear and Coherent; Normal Rate  Speech Volume:Normal  Handedness:Right   Mood and Affect  Mood:Depressed  Affect:Appropriate; Congruent   Thought Process  Thought Processes:Coherent; Linear  Descriptions of Associations:Intact  Orientation:Full (Time, Place and Person)  Thought Content:Logical  History of  Schizophrenia/Schizoaffective disorder:No  Duration of Psychotic Symptoms:N/A  Hallucinations:No data recorded  Ideas of Reference:None  Suicidal Thoughts:No data recorded  Homicidal Thoughts:No data recorded   Sensorium  Memory:Immediate Fair; Recent Fair; Remote Fair  Judgment:Fair  Insight:Fair   Executive Functions  Concentration:Fair  Attention Span:Fair  [place]   Psychomotor Activity  Psychomotor Activity:No data recorded   Assets  Assets:Communication Skills; Financial Resources/Insurance; Housing; Social Support; Physical Health   Sleep  Sleep:No data recorded   Physical Exam: Physical Exam Vitals and nursing note reviewed.  Constitutional:      Appearance: Normal appearance. He is normal weight.  HENT:     Head: Normocephalic.  Eyes:     General: Lids are normal.  Neurological:     General: No focal deficit present.     Mental Status: He is alert and oriented to person, place, and time. Mental status is at baseline.     Motor: Tremor present. No atrophy.  Psychiatric:        Attention and Perception: Attention and perception normal.        Mood and Affect: Mood is anxious and depressed. Affect is tearful.        Speech: Speech normal.        Behavior: Behavior is withdrawn. Behavior is cooperative.        Thought Content: Thought content includes suicidal ideation.        Cognition and Memory: Cognition and memory normal.   Review of Systems  Neurological:  Positive for tremors, focal weakness and weakness.  Psychiatric/Behavioral:  Positive for depression, substance abuse (alcohol use, last drank 1 month ago) and suicidal ideas (suicide attempt). The patient is nervous/anxious and has insomnia (takes [place]).   All other systems reviewed and are negative. Blood pressure 127/68, pulse 66, temperature 97.6 F (36.4 C), resp. rate 20, height 5' 10"  (1.778 m), weight 63.9 kg, SpO2 98 %. Body mass  index is 20.21 kg/m.   Treatment Plan Summary: Daily contact with patient to assess and evaluate symptoms and progress in treatment, Medication management, and Plan     Recommended for inpatient psychiatric treatment. Patient unable to contract for safety.  -Patient is to remain under involuntary commitment at this time, as he continues to be a danger to himself and unable to contract for safety.   -Will continue Abilify 5 mg mg p.o. nightly to target psychosis, depressive symptoms, anxiety, and suicidal ideations.  -Will continue Klonopin 0.5 mg p.o. daily every morning; and Klonopin 1 mg p.o. nightly. -Will continue  mirtazapine 7.5 mg p.o. nightly.  If mirtazapine proven to be effective will begin to decrease his Ambien dose. -Patient was also increased on propranolol 52m p.o. 3  times daily. Insomnia-Patient continues to endorse poor sleep habits recommend documenting sleep hours byt night shift. -He is medically stable to discharge to inpatient psychiatric facility. Patient has been declined at multiple facilities will recommend referral to [facility] at this time. Patient with chronic suicidality, multiple suicide attempts of high lethality, no safe disposition plan at this time. WIll continue to work towards disposition for this patient will likely require long term care and supervision.   Disposition: Recommend psychiatric Inpatient admission when medically cleared. IVC  Suella Broad, FNP 07/25/2021 12:57 PM

## 2021-07-25 NOTE — Plan of Care (Signed)
No acute events overnight. Problem: Education: Goal: Knowledge of risk factors and measures for prevention of condition will improve Outcome: Progressing   Problem: Coping: Goal: Psychosocial and spiritual needs will be supported Outcome: Progressing   Problem: Respiratory: Goal: Will maintain a patent airway Outcome: Progressing Goal: Complications related to the disease process, condition or treatment will be avoided or minimized Outcome: Progressing   Problem: Activity: Goal: Will identify at least one activity in which they can participate Outcome: Progressing   Problem: Coping: Goal: Ability to identify and develop effective coping behavior will improve Outcome: Progressing Goal: Ability to interact with others will improve Outcome: Progressing Goal: Demonstration of participation in decision-making regarding own care will improve Outcome: Progressing Goal: Ability to use eye contact when communicating with others will improve Outcome: Progressing   Problem: Health Behavior/Discharge Planning: Goal: Identification of resources available to assist in meeting health care needs will improve Outcome: Progressing   Problem: Self-Concept: Goal: Will verbalize positive feelings about self Outcome: Progressing   Problem: Education: Goal: Knowledge of General Education information will improve Description: Including pain rating scale, medication(s)/side effects and non-pharmacologic comfort measures Outcome: Progressing   Problem: Health Behavior/Discharge Planning: Goal: Ability to manage health-related needs will improve Outcome: Progressing   Problem: Clinical Measurements: Goal: Ability to maintain clinical measurements within normal limits will improve Outcome: Progressing Goal: Will remain free from infection Outcome: Progressing Goal: Diagnostic test results will improve Outcome: Progressing Goal: Respiratory complications will improve Outcome:  Progressing Goal: Cardiovascular complication will be avoided Outcome: Progressing   Problem: Activity: Goal: Risk for activity intolerance will decrease Outcome: Progressing   Problem: Nutrition: Goal: Adequate nutrition will be maintained Outcome: Progressing   Problem: Coping: Goal: Level of anxiety will decrease Outcome: Progressing   Problem: Elimination: Goal: Will not experience complications related to bowel motility Outcome: Progressing   Problem: Pain Managment: Goal: General experience of comfort will improve Outcome: Progressing   Problem: Safety: Goal: Ability to remain free from injury will improve Outcome: Progressing   Problem: Skin Integrity: Goal: Risk for impaired skin integrity will decrease Outcome: Progressing

## 2021-07-26 DIAGNOSIS — T43202A Poisoning by unspecified antidepressants, intentional self-harm, initial encounter: Secondary | ICD-10-CM | POA: Diagnosis not present

## 2021-07-26 DIAGNOSIS — R251 Tremor, unspecified: Secondary | ICD-10-CM | POA: Diagnosis not present

## 2021-07-26 DIAGNOSIS — T1491XA Suicide attempt, initial encounter: Secondary | ICD-10-CM | POA: Diagnosis not present

## 2021-07-26 DIAGNOSIS — I10 Essential (primary) hypertension: Secondary | ICD-10-CM | POA: Diagnosis not present

## 2021-07-26 NOTE — Plan of Care (Signed)
°  Problem: Education: Goal: Knowledge of risk factors and measures for prevention of condition will improve Outcome: Progressing   Problem: Coping: Goal: Psychosocial and spiritual needs will be supported Outcome: Progressing   Problem: Respiratory: Goal: Will maintain a patent airway Outcome: Progressing Goal: Complications related to the disease process, condition or treatment will be avoided or minimized Outcome: Progressing   Problem: Activity: Goal: Will identify at least one activity in which they can participate Outcome: Progressing   Problem: Coping: Goal: Ability to identify and develop effective coping behavior will improve Outcome: Progressing Goal: Ability to interact with others will improve Outcome: Progressing Goal: Demonstration of participation in decision-making regarding own care will improve Outcome: Progressing Goal: Ability to use eye contact when communicating with others will improve Outcome: Progressing   Problem: Health Behavior/Discharge Planning: Goal: Identification of resources available to assist in meeting health care needs will improve Outcome: Progressing   Problem: Self-Concept: Goal: Will verbalize positive feelings about self Outcome: Progressing   Problem: Education: Goal: Knowledge of General Education information will improve Description: Including pain rating scale, medication(s)/side effects and non-pharmacologic comfort measures Outcome: Progressing   Problem: Health Behavior/Discharge Planning: Goal: Ability to manage health-related needs will improve Outcome: Progressing   Problem: Clinical Measurements: Goal: Ability to maintain clinical measurements within normal limits will improve Outcome: Progressing Goal: Will remain free from infection Outcome: Progressing Goal: Diagnostic test results will improve Outcome: Progressing Goal: Respiratory complications will improve Outcome: Progressing Goal: Cardiovascular  complication will be avoided Outcome: Progressing   Problem: Activity: Goal: Risk for activity intolerance will decrease Outcome: Progressing   Problem: Nutrition: Goal: Adequate nutrition will be maintained Outcome: Progressing   Problem: Coping: Goal: Level of anxiety will decrease Outcome: Progressing   Problem: Elimination: Goal: Will not experience complications related to bowel motility Outcome: Progressing   Problem: Pain Managment: Goal: General experience of comfort will improve Outcome: Progressing   Problem: Safety: Goal: Ability to remain free from injury will improve Outcome: Progressing   Problem: Skin Integrity: Goal: Risk for impaired skin integrity will decrease Outcome: Progressing

## 2021-07-26 NOTE — Progress Notes (Signed)
°   07/26/21 1148  Mobility  Activity Ambulated with assistance in hallway  Level of Assistance Modified independent, requires aide device or extra time  Assistive Device Front wheel walker  Distance Ambulated (ft) 200 ft  Activity Response Tolerated well  $Mobility charge 1 Mobility   Pt agreeable to mobilize this morning. Ambulated about 245f in hall with RW, tolerated well. No complaints. Left pt in bed, call bell at side. RN/NT notified of session. Sitter present.  KElySpecialist Acute Rehab Services Office: [phone]

## 2021-07-26 NOTE — Progress Notes (Signed)
PROGRESS NOTE    SHIHEEM CORPORAN  CBS:[id number] DOB: 18-May-1956 DOA: 07/01/2021 PCP: Sandi Mariscal, MD   Brief Narrative: [name] is a 66 year old male with history of EtOH abuse, ankylosing spondylitis, anxiety, depression, history of colitis and chronic diarrhea, chronic headaches, chronic pain, HTN who presented to the hospital after intentional overdose of approximately 60 tablets of duloxetine.  There were multiple ER visits since September 2022 for anxiety, suicidal ideation, pain, and other issues.  He had been concerned with becoming homeless and endorsed wanting to "die because of this".  He was admitted to the hospital for further work-up as well as poison control evaluation and psychiatry assessment. He was incidentally found to be positive for COVID-19 as well. He is now medically stable and awaiting behavioral health admission.   Assessment and Plan: * Antidepressant overdose-resolved as of 07/12/2021, (present on admission) Monitored while inpatient. Consulted with poison control on admission  Alcohol abuse- (present on admission) Noted.  Tremor Chronic issue. Started propranolol -Continue propranolol, titrate as needed/tolerated  Suicide attempt [facility]) Prior to admission. Patient took Cymbalta. Psychiatry consulted and have recommended inpatient behavioral health admission. Medically stable for discharge.  Gastroesophageal reflux disease- (present on admission) -Continue protonix   Hyponatremia- (present on admission) Mild. History of SIADH. Stable.  Major depression, recurrent ([place])- (present on admission) -Psychiatry recommendations: Buspar 15 mg TID, Klonopin 0.5 mg qAM and 1 mg qHS  Essential hypertension- (present on admission) -Continue lisinopril  Allergic reaction-resolved as of 07/20/2021 Unsure of etiology. Occurred on 2/3. Resolved with Benadryl, Pepcid and Solu-medrol.  COVID-19 virus infection-resolved as of 07/11/2021 Diagnosed on 07/01/21.  Incidental. Patient treated with molnupiravir and completed course. Isolation discontinued.  Chest pain-resolved as of 07/25/2021 Not typical. EKG normal. Reproducible. No associated dyspnea. Likely heavily contributed to by anxiety. Possibly musculoskeletal since there is an aspect of reproducibility. Also with RUQ abdominal/lower chest pain. Imaging unremarkable. Pain is improved.    DVT prophylaxis: Lovenox Code Status:   Code Status: Full Code Family Communication: None at bedside Disposition Plan: Discharge to behavioral health hospital once a bed is available. Medically stable for discharge.   Consultants:  Psychiatry  Procedures:  None  Antimicrobials: Molnupiravir     Subjective: Patient appears calm prior to seeing me in the room. Once I entered, patient developed tremor of right arm and left leg. Patient states he can't stay still. After further questioning, he states sometimes he can stay still for a few seconds.  States that he is not lying.  Objective: Vitals:   07/25/21 1423 07/25/21 2050 07/26/21 0630 07/26/21 1341  BP: 125/70 118/67 108/75 132/75  Pulse: 72 66 75 68  Resp: 18 18 18 20   Temp: 98.1 F (36.7 C) (!) 97.5 F (36.4 C) 98 F (36.7 C) 98.4 F (36.9 C)  TempSrc: Oral Oral Oral Oral  SpO2: 100% 100% (!) 0% 98%  Weight:      Height:        Intake/Output Summary (Last 24 hours) at 07/26/2021 1409 Last data filed at 07/26/2021 1334 Gross per 24 hour  Intake 1560 ml  Output --  Net 1560 ml    Filed Weights   07/01/21 0649 07/03/21 0845  Weight: 63.5 kg 63.9 kg    Examination:  General exam: No distress Respiratory system: Clear to auscultation. Respiratory effort normal. Cardiovascular system: S1 & S2 heard, RRR. No murmurs, rubs, gallops or clicks. Gastrointestinal system: Abdomen is nondistended, soft and nontender. No organomegaly or masses felt. Normal bowel sounds  heard. Central nervous system: Alert and oriented. Musculoskeletal: No  edema. No calf tenderness Skin: No cyanosis. No rashes Psychiatry: Anxious   Data Reviewed: I have personally reviewed following labs and imaging studies  CBC Lab Results  Component Value Date   WBC 8.4 07/18/2021   RBC 3.73 (L) 07/18/2021   HGB 11.9 (L) 07/18/2021   HCT 35.5 (L) 07/18/2021   MCV 95.2 07/18/2021   MCH 31.9 07/18/2021   PLT 331 07/18/2021   MCHC 33.5 07/18/2021   RDW 13.2 07/18/2021   LYMPHSABS 2.7 07/18/2021   MONOABS 1.0 07/18/2021   EOSABS 0.1 07/18/2021   BASOSABS 0.1 33/29/5188     Last metabolic panel Lab Results  Component Value Date   NA 134 (L) 07/24/2021   K 3.9 07/24/2021   CL 98 07/24/2021   CO2 31 07/24/2021   BUN 10 07/24/2021   CREATININE 0.62 07/24/2021   GLUCOSE 90 07/24/2021   GFRNONAA >60 07/24/2021   GFRAA >60 11/21/2019   CALCIUM 8.9 07/24/2021   PHOS 2.8 12/13/2016   PROT 6.5 07/06/2021   ALBUMIN 3.7 07/06/2021   BILITOT 0.8 07/06/2021   ALKPHOS 59 07/06/2021   AST 20 07/06/2021   ALT 15 07/06/2021   ANIONGAP 5 07/24/2021    CBG (last 3)  No results for input(s): GLUCAP in the last 72 hours.   GFR: Estimated Creatinine Clearance: 83.2 mL/min (by C-G formula based on SCr of 0.62 mg/dL).  Coagulation Profile: No results for input(s): INR, PROTIME in the last 168 hours.  No results found for this or any previous visit (from the past 240 hour(s)).      Radiology Studies: No results found.      Scheduled Meds:  ARIPiprazole  5 mg Oral QHS   Chlorhexidine Gluconate Cloth  6 each Topical Daily   clonazePAM  0.5 mg Oral Q breakfast   And   clonazePAM  1 mg Oral QHS   enoxaparin (LOVENOX) injection  40 mg Subcutaneous Q24H   lisinopril  30 mg Oral Daily   loratadine  10 mg Oral Daily   mouth rinse  15 mL Mouth Rinse BID   mirtazapine  7.5 mg Oral QHS   pantoprazole  40 mg Oral QAC breakfast   potassium chloride  20 mEq Oral BID   propranolol  20 mg Oral TID   sodium chloride flush  10-40 mL Intracatheter  Q12H   sucralfate  1 g Oral QID   zolpidem  5 mg Oral QHS   Continuous Infusions:  sodium chloride 10 mL/hr at 07/23/21 0600     LOS: 24 days     Cordelia Poche, MD [facility] 07/26/2021, 2:09 PM  If 7PM-7AM, please contact night-coverage [email or web address]

## 2021-07-26 NOTE — TOC Progression Note (Addendum)
Transition of Care [facility]) - Progression Note    Patient Details  Name: BILLYJOE GO MRN: [id number] Date of Birth: 11/19/1955  Transition of Care [facility]) CM/SW Contact  Ross Ludwig, [place] Phone Number: 07/26/2021, 5:32 PM  Clinical Narrative:     CSW contacted [facility] and confirmed patient is still on the wait list.  CSW to continue to follow patient's progress throughout discharge planning.  Patient's IVC is still valid till 07/28/2021.  CSW also refaxed out clinicals to the following geropsych facilities: Inpatient geropsych  Contact Phone Number  [facility]       [place]         [place]         [phone] or [place]            [phone] [place]ela Ports          [phone] [place] [facility]     [phone] [place] [facility]         [place]       [place]                 [phone] or [place]           [place]                  [phone] ext. [place]        Expected Discharge Plan and Services  Inpatient geropsych                                               Social Determinants of Health (SDOH) Interventions    Readmission Risk Interventions No flowsheet data found.

## 2021-07-27 DIAGNOSIS — T43204A Poisoning by unspecified antidepressants, undetermined, initial encounter: Secondary | ICD-10-CM | POA: Diagnosis not present

## 2021-07-27 NOTE — Progress Notes (Signed)
PROGRESS NOTE    Anthony Lamb  NGE:[id number] DOB: May 26, 1956 DOA: 07/01/2021 PCP: Sandi Mariscal, MD   Brief Narrative: [name] is a 66 year old male with history of EtOH abuse, ankylosing spondylitis, anxiety, depression, history of colitis and chronic diarrhea, chronic headaches, chronic pain, HTN who presented to the hospital after intentional overdose of approximately 60 tablets of duloxetine.  There were multiple ER visits since September 2022 for anxiety, suicidal ideation, pain, and other issues.  He had been concerned with becoming homeless and endorsed wanting to "die because of this".  He was admitted to the hospital for further work-up as well as poison control evaluation and psychiatry assessment. He was incidentally found to be positive for COVID-19 as well. He is now medically stable and awaiting behavioral health admission.   Assessment and Plan: * Antidepressant overdose-resolved as of 07/12/2021, (present on admission) Monitored while inpatient. Consulted with poison control on admission  Tremor Chronic issue. Started propranolol -Continue propranolol, titrate as needed/tolerated  Suicide attempt [facility]) Prior to admission. Patient took Cymbalta. Psychiatry consulted and have recommended inpatient behavioral health admission. Medically stable for discharge.  Alcohol abuse- (present on admission) Noted.  Gastroesophageal reflux disease- (present on admission) -Continue protonix   Hyponatremia- (present on admission) Mild. History of SIADH. Stable.  Major depression, recurrent ([place])- (present on admission) -Psychiatry recommendations: Buspar 15 mg TID, Klonopin 0.5 mg qAM and 1 mg qHS  Essential hypertension- (present on admission) -Continue lisinopril  Allergic reaction-resolved as of 07/20/2021 Unsure of etiology. Occurred on 2/3. Resolved with Benadryl, Pepcid and Solu-medrol.  COVID-19 virus infection-resolved as of 07/11/2021 Diagnosed on 07/01/21.  Incidental. Patient treated with molnupiravir and completed course. Isolation discontinued.  Chest pain-resolved as of 07/25/2021 Not typical. EKG normal. Reproducible. No associated dyspnea. Likely heavily contributed to by anxiety. Possibly musculoskeletal since there is an aspect of reproducibility. Also with RUQ abdominal/lower chest pain. Imaging unremarkable. Pain is improved.    DVT prophylaxis: Lovenox Code Status:   Code Status: Full Code Family Communication: None at bedside Disposition Plan: Discharge to behavioral health hospital once a bed is available. Medically stable for discharge.   Consultants:  Psychiatry  Procedures:  None  Antimicrobials: Molnupiravir     Subjective: Reports tremors.  Reports anxiety.  No nausea no vomiting.  Ambulating in the room and hallway.  Objective: Vitals:   07/26/21 1341 07/26/21 2143 07/27/21 0428 07/27/21 1450  BP: 132/75 134/72 108/64 (!) 146/75  Pulse: 68 65 60 69  Resp: 20 14 14 18   Temp: 98.4 F (36.9 C) 97.6 F (36.4 C) 98 F (36.7 C) 97.9 F (36.6 C)  TempSrc: Oral Oral Oral Axillary  SpO2: 98% 99% 97% 100%  Weight:      Height:       No intake or output data in the 24 hours ending 07/27/21 2018  Filed Weights   07/01/21 0649 07/03/21 0845  Weight: 63.5 kg 63.9 kg    Examination:  General: Appear in mild distress, no Rash; Oral Mucosa Clear, moist. no Abnormal Neck Mass Or lumps, Conjunctiva normal  Cardiovascular: S1 and S2 Present, no Murmur, Respiratory: good respiratory effort, Bilateral Air entry present and CTA, no Crackles, no wheezes Abdomen: Bowel Sound present, Soft and no tenderness Extremities: no Pedal edema Neurology: alert and oriented to time, place, and person affect anxious, bilateral tremors upper and lower extremity. no new focal deficit Gait not checked due to patient safety concerns    Data Reviewed:   I have Reviewed nursing notes,  Vitals, and Lab results since pt's last  encounter. Pertinent lab results BMP I have reviewed the last note from psychiatric,   Author:  Berle Mull, MD [facility] 07/27/2021  8:19 PM   To reach On-call, see care teams to locate the attending and reach out to them via [email or web address]. If 7PM-7AM, please contact night-coverage If you still have difficulty reaching the attending provider, please page the [facility] (Director on Call) for [facility] on amion for assistance.

## 2021-07-27 NOTE — Progress Notes (Signed)
°   07/27/21 1200  Mobility  Activity Ambulated with assistance in hallway  Level of Assistance Standby assist, set-up cues, supervision of patient - no hands on  Assistive Device Front wheel walker  Distance Ambulated (ft) 200 ft  Activity Response Tolerated well  $Mobility charge 1 Mobility   Pt agreeable to mobilize this morning. Ambulated about 240f in hall with RW, tolerated well. Noted 8/10 spinal pain today. Requested to use restroom upon return. Left pt in bed, call bell at side. RN/NT notified of session. Sitter present.   KParkSpecialist Acute Rehab Services Office: [phone]

## 2021-07-27 NOTE — Progress Notes (Signed)
Physical Therapy Treatment and Discharge from Acute PT Patient Details Name: Anthony Lamb MRN: [id number] DOB: [birth year] Today's Date: 07/27/2021   History of Present Illness 66 year old male who came to hospital on 07/01/21 after taking 60 tablets of duloxetine.  He has had multiple ER visits since September for anxiety, suicidal ideation, pain and other issues.  Pt has been involuntary commited with plan for transfer to inpt psych due to [place].Pt with history of EtOH abuse, ankylosing spondylitis, anxiety, depression, history of colitis and chronic diarrhea, chronic headaches, chronic pain, hypertension,    PT Comments    Pt mobilizing at supervision level at this time. Pt utilizing RW well and has slow and steady gait with RW.  Pt has been ambulating with sitter and mobility specialist as well.  Pt's d/c plan involves behavorial/pysch unit and pt does not require PT f/u at this time.  Pt has been using RW well and would benefit from RW upon d/c. Continue to recommend staff assist pt with mobility during remainder of hospitalization however pt no longer requires skilled PT.  PT to sign off.    Recommendations for follow up therapy are one component of a multi-disciplinary discharge planning process, led by the attending physician.  Recommendations may be updated based on patient status, additional functional criteria and insurance authorization.  Follow Up Recommendations  No PT follow up     Assistance Recommended at Discharge Intermittent Supervision/Assistance  Patient can return home with the following Assistance with cooking/housework;Help with stairs or ramp for entrance;A little help with walking and/or transfers   Equipment Recommendations  Rolling walker (2 wheels)    Recommendations for Other Services       Precautions / Restrictions Precautions Precautions: Fall     Mobility  Bed Mobility Overal bed mobility: Modified Independent                   Transfers Overall transfer level: Needs assistance Equipment used: Rolling walker (2 wheels) Transfers: Sit to/from Stand Sit to Stand: Supervision                Ambulation/Gait Ambulation/Gait assistance: Supervision Gait Distance (Feet): 300 Feet Assistive device: Rolling walker (2 wheels) Gait Pattern/deviations: Decreased stride length, Step-through pattern, Trunk flexed       General Gait Details: pt with no LOB, slow gait; min cues for posture however pt limited due to pain (hx Ankylosing spondylitis)   Stairs             Wheelchair Mobility    Modified Rankin (Stroke Patients Only)       Balance                                            Cognition Arousal/Alertness: Awake/alert Behavior During Therapy: Flat affect Overall Cognitive Status: No family/caregiver present to determine baseline cognitive functioning Area of Impairment: Safety/judgement                               General Comments: Pt tends to perserverate on negatives        Exercises      General Comments        Pertinent Vitals/Pain Pain Assessment Pain Assessment: Faces Faces Pain Scale: Hurts a little bit Pain Location: upper back and neck (reports hx of Ankylosing spondylitis) Pain Descriptors /  Indicators: Discomfort Pain Intervention(s): Repositioned, Monitored during session    Home Living                          Prior Function            PT Goals (current goals can now be found in the care plan section) Progress towards PT goals: Goals met/education completed, patient discharged from PT    Frequency           PT Plan Other (comment) (d/c from acute PT)    Co-evaluation              AM-PAC PT "6 Clicks" Mobility   Outcome Measure  Help needed turning from your back to your side while in a flat bed without using bedrails?: None Help needed moving from lying on your back to sitting on the side  of a flat bed without using bedrails?: None Help needed moving to and from a bed to a chair (including a wheelchair)?: None Help needed standing up from a chair using your arms (e.g., wheelchair or bedside chair)?: A Little Help needed to walk in hospital room?: A Little Help needed climbing 3-5 steps with a railing? : A Little 6 Click Score: 21    End of Session   Activity Tolerance: Patient tolerated treatment well Patient left: in bed;with call bell/phone within reach;with nursing/sitter in room   PT Visit Diagnosis: Difficulty in walking, not elsewhere classified (R26.2)     Time: 1222-4114 PT Time Calculation (min) (ACUTE ONLY): 14 min  Charges:  $Gait Training: 8-22 mins                    Arlyce Dice, DPT Acute Rehabilitation Services Pager: [phone] Office: [place] 07/27/2021, 5:02 PM

## 2021-07-27 NOTE — Plan of Care (Signed)
°  Problem: Education: Goal: Knowledge of risk factors and measures for prevention of condition will improve Outcome: Progressing   Problem: Coping: Goal: Psychosocial and spiritual needs will be supported Outcome: Progressing   Problem: Respiratory: Goal: Will maintain a patent airway Outcome: Progressing Goal: Complications related to the disease process, condition or treatment will be avoided or minimized Outcome: Progressing   Problem: Activity: Goal: Will identify at least one activity in which they can participate Outcome: Progressing   Problem: Coping: Goal: Ability to identify and develop effective coping behavior will improve Outcome: Progressing Goal: Ability to interact with others will improve Outcome: Progressing Goal: Demonstration of participation in decision-making regarding own care will improve Outcome: Progressing Goal: Ability to use eye contact when communicating with others will improve Outcome: Progressing   Problem: Health Behavior/Discharge Planning: Goal: Identification of resources available to assist in meeting health care needs will improve Outcome: Progressing   Problem: Self-Concept: Goal: Will verbalize positive feelings about self Outcome: Progressing   Problem: Education: Goal: Knowledge of General Education information will improve Description: Including pain rating scale, medication(s)/side effects and non-pharmacologic comfort measures Outcome: Progressing   Problem: Health Behavior/Discharge Planning: Goal: Ability to manage health-related needs will improve Outcome: Progressing   Problem: Clinical Measurements: Goal: Ability to maintain clinical measurements within normal limits will improve Outcome: Progressing Goal: Will remain free from infection Outcome: Progressing Goal: Diagnostic test results will improve Outcome: Progressing Goal: Respiratory complications will improve Outcome: Progressing Goal: Cardiovascular  complication will be avoided Outcome: Progressing   Problem: Activity: Goal: Risk for activity intolerance will decrease Outcome: Progressing   Problem: Nutrition: Goal: Adequate nutrition will be maintained Outcome: Progressing   Problem: Coping: Goal: Level of anxiety will decrease Outcome: Progressing   Problem: Elimination: Goal: Will not experience complications related to bowel motility Outcome: Progressing   Problem: Pain Managment: Goal: General experience of comfort will improve Outcome: Progressing   Problem: Safety: Goal: Ability to remain free from injury will improve Outcome: Progressing   Problem: Skin Integrity: Goal: Risk for impaired skin integrity will decrease Outcome: Progressing

## 2021-07-28 DIAGNOSIS — T43202A Poisoning by unspecified antidepressants, intentional self-harm, initial encounter: Secondary | ICD-10-CM | POA: Diagnosis not present

## 2021-07-28 DIAGNOSIS — T50902A Poisoning by unspecified drugs, medicaments and biological substances, intentional self-harm, initial encounter: Secondary | ICD-10-CM | POA: Diagnosis not present

## 2021-07-28 LAB — BASIC METABOLIC PANEL
Anion gap: 7 (ref 5–15)
BUN: 8 mg/dL (ref 8–23)
CO2: 29 mmol/L (ref 22–32)
Calcium: 8.9 mg/dL (ref 8.9–10.3)
Chloride: 95 mmol/L — ABNORMAL LOW (ref 98–111)
Creatinine, Ser: 0.55 mg/dL — ABNORMAL LOW (ref 0.61–1.24)
GFR, Estimated: >60 mL/min (ref >60)
Glucose, Bld: 84 mg/dL (ref 70–99)
Potassium: 4.2 mmol/L (ref 3.5–5.1)
Sodium: 131 mmol/L — ABNORMAL LOW (ref 135–145)

## 2021-07-28 LAB — CBC
HCT: 34.9 % — ABNORMAL LOW (ref 39.0–52.0)
Hemoglobin: 11.7 g/dL — ABNORMAL LOW (ref 13.0–17.0)
MCH: 32 pg (ref 26.0–34.0)
MCHC: 33.5 g/dL (ref 30.0–36.0)
MCV: 95.4 fL (ref 80.0–100.0)
Platelets: 355 10*3/uL (ref 150–400)
RBC: 3.66 MIL/uL — ABNORMAL LOW (ref 4.22–5.81)
RDW: 13.1 % (ref 11.5–15.5)
WBC: 7.2 10*3/uL (ref 4.0–10.5)
nRBC: 0 % (ref 0.0–0.2)

## 2021-07-28 NOTE — Progress Notes (Signed)
[facility] PROGRESS NOTE  Patient: SIGMUND MORERA MWU:[id number]   PCP: Sandi Mariscal, MD DOB: [birth year]   DOA: 07/01/2021   DOS: 07/28/2021    Subjective: No acute complaint no nausea no vomiting ongoing complaints of anxiety and tremors. Reported diarrhea.  Objective:  Vitals:   07/28/21 1611 07/28/21 2008  BP: 129/71 128/67  Pulse: 64 65  Resp: 16 18  Temp: 98.6 F (37 C) 98.5 F (36.9 C)  SpO2: 100% 98%    S1-S2 present. Clear to auscultation. Bowel sound present. No tenderness with No edema.  Assessment and plan: Suicidal ideation. Major depression. Appreciate psychiatry consultation. Continue current management.  Author: Berle Mull, MD [facility] 07/28/2021 8:56 PM   If 7PM-7AM, please contact night-coverage at [email or web address]

## 2021-07-28 NOTE — Consult Note (Signed)
Anthony Lamb Psychiatry Consult   Reason for Consult:  Suicide attempt by overdose on cymbalta Referring Physician:  Dr. 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 male patient admitted with suicide attempt by overdose.  Patient presented after overdosing on (60) 60 mg tablets of Cymbalta, he subsequently developed serotonin syndrome and severe hyponatremia that resulted in admission to intensive care for hyponatremia protocol.  Patient is seen and reassessed by this nurse practitioner.  Case discussed with Dr. Dwyane Dee. Today patients appears to be preoccupied on his anxiety and tremors. He is unable to complete a full thought as he is so distracted by his anxiety and tremors. He begins to ask for prn medications via IV to help with his medications. Attempted to shift the discussion to previous Coping skills that will help with management of his anxiety.  Attempted to reflect on previous hospitalizations in which he received treatment for coping skills, behavior modifications, and other treatment modalities.  He states walking down the halls and deep breathing seemed to help.  He is encouraged to continue to focus on these coping skills, also offered to take him outdoors or print off a list of coping skills in which she declined both.  He does state that this is the worst he has ever felt, and suspects it is contributing to his hydroxyzine in which he took at 6 AM this morning.  Patient states his tremors are new, despite previous clinical  documentation shows patient has tremors at rest.  He was previously started on mirtazapine last week, and noted that this medication did help to improve his sleep and was effective.  When assessing his sleep last night, he declines stating he did not sleep at all.  He then begins to perseverate about Ambien, and not to discontinue this medication.  Patient is a poor historian and unclear at times when his story is fabricated as he states" when I would come into the ED with those tremors, I will be faking.  I made it all up. " He denies any new side effects, adverse reactions, muscle stiffness, confusion or disorientation at this time. WIll continue to recommend inpatient psych at this time.   Patient is now medically stable discharge to inpatient general psych hospital for crisis stabilization, medication management, and behavior modification. Patient has been declined at multiple acute inpatient facilities, suspect underlying barrier is a diagnosis of dementia in the context of alcohol withdraw and Delirium tremens. Patient has a significant history of alcohol abuse, that was treated with  long term use of benzodiazepines which has now developed into severe dependency.   HPI:  Anthony Lamb is a 66 y.o. male with a past medical history significant for hypertension, GERD, peptic ulcer disease, ankylosing spondylitis, irritable bowel syndrome, chronic pain, anxiety, and depression who presents for suicide attempt by overdose.  According to EMS and patient, at 545 this morning, patient tried to overdose by taking 60 duloxetine tablets.  EMS thought that they were 60 mg tablets by report.  Patient reports she is feeling some waxing waning nausea, lightheadedness,  and feels like he has no bowel movement.  He feels very dehydrated and tired.  He also is feeling very anxious and agitated.  He still says he wants to kill himself.  He reports that he was discharged from the emergency department yesterday at [facility] and he still says he is going to try to kill himself he leaves again today.  Past Psychiatric History: Suicidal ideations, generalized anxiety, major depressive disorder recurrent with atypical features, alcohol abuse, benzodiazepine use disorder severe  Risk to Self: Yes Risk to Others: Denies Prior Inpatient Therapy: Yes most recent [facility] December 2022 Prior Outpatient Therapy: Denies currently followed by Dr. Nancy Fetter at [facility].  Past Medical History:  Past Medical History:  Diagnosis Date   Alcohol abuse, in remission    Anal fissure    Anemia    Ankylosing spondylitis (HCC)    Anxiety    Arthritis    Chronic diarrhea    Chronic headaches    Chronic pain    Colitis    Colon polyps    Depression    Esophagitis    Gastric polyps    hyperplastic and fundic gland   Gastritis    GERD (gastroesophageal reflux disease)    Hypertension    IBS (irritable bowel syndrome)    Poor dentition    SIADH (syndrome of inappropriate ADH production) ([place])     Past Surgical History:  Procedure Laterality Date   BIOPSY  11/26/2018   Procedure: BIOPSY;  Surgeon: Gatha Mayer, MD;  Location: [facility];  Service: Endoscopy;;   COLONOSCOPY W/ BIOPSIES     COLONOSCOPY WITH PROPOFOL N/A 11/26/2018   Procedure: COLONOSCOPY WITH PROPOFOL;  Surgeon: Gatha Mayer, MD;  Location: [facility];  Service: Endoscopy;  Laterality: N/A;   ESOPHAGOGASTRODUODENOSCOPY     ESOPHAGOGASTRODUODENOSCOPY (EGD) WITH PROPOFOL N/A 11/26/2018   Procedure: ESOPHAGOGASTRODUODENOSCOPY (EGD) WITH PROPOFOL;  Surgeon: Gatha Mayer, MD;  Location: [facility];  Service: Endoscopy;  Laterality: N/A;   HEMOSTASIS CLIP PLACEMENT  11/26/2018   Procedure: HEMOSTASIS CLIP PLACEMENT;  Surgeon: Gatha Mayer, MD;  Location: [facility];  Service: Endoscopy;;   HERNIA REPAIR Bilateral    HOT HEMOSTASIS N/A 11/26/2018   Procedure: HOT HEMOSTASIS (ARGON PLASMA COAGULATION/BICAP);  Surgeon: Gatha Mayer,  MD;  Location: [facility];  Service: Endoscopy;  Laterality: N/A;   OPEN REDUCTION INTERNAL FIXATION (ORIF) DISTAL RADIAL FRACTURE Right 02/11/2018   Procedure: OPEN REDUCTION INTERNAL FIXATION (ORIF) DISTAL RADIAL FRACTURE;  Surgeon: Milly Jakob, MD;  Location: [facility];  Service: Orthopedics;  Laterality: Right;   POLYPECTOMY  11/26/2018   Procedure: POLYPECTOMY;  Surgeon: Gatha Mayer, MD;  Location: [facility];  Service: Endoscopy;;   TONSILLECTOMY     Family History:  Family History  Problem Relation Age of Onset   Anxiety disorder Mother    Congestive Heart Failure Mother    Crohn's disease Mother    Colon cancer Mother 39   Arthritis Father    High blood pressure Father    Crohn's disease Father    Family Psychiatric  History: Denies Social History:  Social History   Substance and Sexual Activity  Alcohol Use Not Currently     Social History   Substance and Sexual Activity  Drug Use Not Currently   Types: Marijuana    Social History   Socioeconomic History   Marital status: Single    Spouse name: Not on file   Number of children: 0  Years of education: Not on file   Highest education level: Not on file  Occupational History   Not on file  Tobacco Use   Smoking status: Former    Types: Cigarettes    Quit date: 2014    Years since quitting: 9.1   Smokeless tobacco: Never  Vaping Use   Vaping Use: Never used  Substance and Sexual Activity   Alcohol use: Not Currently   Drug use: Not Currently    Types: Marijuana   Sexual activity: Not Currently  Other Topics Concern   Not on file  Social History Narrative   HSG. Long - term disability - unable to work. Lived with his mother in her house - she died 2023-04-24 -    Lives in apartment   Has case worker   Social Determinants of Radio broadcast assistant Strain: Not on file  Food Insecurity: Not on file  Transportation Needs: Not on file  Physical Activity: Not on file  Stress: Not on file   Social Connections: Not on file   Additional Social History:    Allergies:   Allergies  Allergen Reactions   Nsaids Other (See Comments)    GI upset- history of peptic ulcers!!   Diphenhydramine Hcl Other (See Comments)    Restlessness   Flexeril [Cyclobenzaprine] Other (See Comments)    Restlessness   Fluoxetine Other (See Comments)    Made the patient feel "worse than before" (treatment)   Hctz [Hydrochlorothiazide] Other (See Comments)    Caused to lose sodium when taking with Lisinopril   Rexulti [Brexpiprazole] Other (See Comments)    "Made me not feel right- restless"   Seroquel [Quetiapine] Other (See Comments)    Restless legs and makes the patient sweat- also does not help patient's insomnia. Pt takes this at home in 2023.   Gabapentin Diarrhea   Hydroxyzine Other (See Comments)    Restlessness    Labs:  Results for orders placed or performed during the hospital encounter of 07/01/21 (from the past 48 hour(s))  CBC     Status: Abnormal   Collection Time: 07/28/21  9:15 AM  Result Value Ref Range   WBC 7.2 4.0 - 10.5 K/uL   RBC 3.66 (L) 4.22 - 5.81 MIL/uL   Hemoglobin 11.7 (L) 13.0 - 17.0 g/dL   HCT 34.9 (L) 39.0 - 52.0 %   MCV 95.4 80.0 - 100.0 fL   MCH 32.0 26.0 - 34.0 pg   MCHC 33.5 30.0 - 36.0 g/dL   RDW 13.1 11.5 - 15.5 %   Platelets 355 150 - 400 K/uL   nRBC 0.0 0.0 - 0.2 %    Comment: Performed at [facility], [place] [address] [place], [place] [address]  Basic metabolic panel     Status: Abnormal   Collection Time: 07/28/21  9:15 AM  Result Value Ref Range   Sodium 131 (L) 135 - 145 mmol/L   Potassium 4.2 3.5 - 5.1 mmol/L   Chloride 95 (L) 98 - 111 mmol/L   CO2 29 22 - 32 mmol/L   Glucose, Bld 84 70 - 99 mg/dL    Comment: Glucose reference range applies only to samples taken after fasting for at least 8 hours.   BUN 8 8 - 23 mg/dL   Creatinine, Ser 0.55 (L) 0.61 - 1.24 mg/dL   Calcium 8.9 8.9 - 10.3 mg/dL   GFR, Estimated >60  >60 mL/min    Comment: (NOTE) Calculated using the CKD-EPI  Creatinine Equation (2021)    Anion gap 7 5 - 15    Comment: Performed at [facility], [place] [address], [place], [place] [address]      Current Facility-Administered Medications  Medication Dose Route Frequency Provider Last Rate Last Admin   0.9 %  sodium chloride infusion   Intravenous PRN Dwyane Dee, MD 10 mL/hr at 07/23/21 0600 Infusion Verify at 07/23/21 0600   acetaminophen (TYLENOL) tablet 650 mg  650 mg Oral Q4H PRN Dwyane Dee, MD   650 mg at 07/28/21 1133   alum & mag hydroxide-simeth (MAALOX/MYLANTA) 200-200-20 MG/5ML suspension 30 mL  30 mL Oral Q4H PRN Dwyane Dee, MD   30 mL at 07/22/21 0850   ARIPiprazole (ABILIFY) tablet 5 mg  5 mg Oral QHS Suella Broad, FNP   5 mg at 07/27/21 2105   Chlorhexidine Gluconate Cloth 2 % PADS 6 each  6 each Topical Daily Dwyane Dee, MD   6 each at 07/28/21 1027   clonazePAM (KLONOPIN) tablet 0.5 mg  0.5 mg Oral Q breakfast Suella Broad, FNP   0.5 mg at 07/28/21 5631   And   clonazePAM (KLONOPIN) tablet 1 mg  1 mg Oral QHS Suella Broad, FNP   1 mg at 07/27/21 2106   enoxaparin (LOVENOX) injection 40 mg  40 mg Subcutaneous Q24H Dwyane Dee, MD   40 mg at 07/27/21 2106   hydrALAZINE (APRESOLINE) tablet 25 mg  25 mg Oral Q4H PRN Dwyane Dee, MD   25 mg at 07/13/21 1701   lisinopril (ZESTRIL) tablet 30 mg  30 mg Oral Daily Dwyane Dee, MD   30 mg at 07/28/21 1025   loperamide (IMODIUM) capsule 2 mg  2 mg Oral PRN Phillips Grout, MD   2 mg at 07/25/21 0704   loratadine (CLARITIN) tablet 10 mg  10 mg Oral Daily Manuella Ghazi, Pratik D, DO   10 mg at 07/28/21 1025   MEDLINE mouth rinse  15 mL Mouth Rinse BID Dwyane Dee, MD   15 mL at 07/27/21 4970   mirtazapine (REMERON) tablet 7.5 mg  7.5 mg Oral QHS Suella Broad, FNP   7.5 mg at 07/27/21 2105   naphazoline-pheniramine (NAPHCON-A) 0.025-0.3 % ophthalmic solution 1  drop  1 drop Both Eyes QID PRN Heath Lark D, DO   1 drop at 07/19/21 1750   oxyCODONE (Oxy IR/ROXICODONE) immediate release tablet 5 mg  5 mg Oral Q12H PRN Manuella Ghazi, Pratik D, DO   5 mg at 07/28/21 2637   pantoprazole (PROTONIX) EC tablet 40 mg  40 mg Oral QAC breakfast Dwyane Dee, MD   40 mg at 07/28/21 0709   phenol (CHLORASEPTIC) mouth spray 1 spray  1 spray Mouth/Throat PRN Dwyane Dee, MD   1 spray at 07/11/21 2054   polyethylene glycol (MIRALAX / GLYCOLAX) packet 17 g  17 g Oral Daily PRN Dwyane Dee, MD   17 g at 07/06/21 8588   polyvinyl alcohol (LIQUIFILM TEARS) 1.4 % ophthalmic solution 1 drop  1 drop Both Eyes PRN Jennye Boroughs, MD   1 drop at 07/17/21 2106   potassium chloride SA (KLOR-CON M) CR tablet 20 mEq  20 mEq Oral BID Derrill Kay A, MD   20 mEq at 07/28/21 1025   propranolol (INDERAL) tablet 20 mg  20 mg Oral TID Mariel Aloe, MD   20 mg at 07/28/21 1025   sodium chloride (OCEAN) 0.65 % nasal spray 1 spray  1 spray Each  Nare PRN Dwyane Dee, MD   1 spray at 07/25/21 0920   sodium chloride flush (NS) 0.9 % injection 10-40 mL  10-40 mL Intracatheter Q12H Dwyane Dee, MD   10 mL at 07/28/21 1028   sodium chloride flush (NS) 0.9 % injection 10-40 mL  10-40 mL Intracatheter PRN Dwyane Dee, MD       sucralfate (CARAFATE) tablet 1 g  1 g Oral QID Dwyane Dee, MD   1 g at 07/28/21 1025   zolpidem (AMBIEN) tablet 5 mg  5 mg Oral Standley Brooking, MD   5 mg at 07/27/21 2200    Musculoskeletal: Strength & Muscle Tone: within normal limits Gait & Station: normal Patient leans: N/A     Psychiatric Specialty Exam:  Presentation  General Appearance: Appropriate for Environment; Casual  Eye Contact:Fair  Speech:Clear and Coherent; Normal Rate  Speech Volume:Normal  Handedness:Right   Mood and Affect  Mood:Depressed  Affect:Appropriate; Congruent   Thought Process  Thought Processes:Coherent; Linear  Descriptions of  Associations:Intact  Orientation:Full (Time, Place and Person)  Thought Content:Logical  History of Schizophrenia/Schizoaffective disorder:No  Duration of Psychotic Symptoms:N/A  Hallucinations:No data recorded  Ideas of Reference:None  Suicidal Thoughts:No data recorded  Homicidal Thoughts:No data recorded   Sensorium  Memory:Immediate Fair; Recent Fair; Remote Fair  Judgment:Fair  Insight:Fair   Executive Functions  Concentration:Fair  Attention Span:Fair  [place]   Psychomotor Activity  Psychomotor Activity:No data recorded   Assets  Assets:Communication Skills; Financial Resources/Insurance; Housing; Social Support; Physical Health   Sleep  Sleep:No data recorded   Physical Exam: Physical Exam Vitals and nursing note reviewed.  Constitutional:      Appearance: Normal appearance. He is normal weight.  HENT:     Head: Normocephalic.  Eyes:     General: Lids are normal.  Neurological:     General: No focal deficit present.     Mental Status: He is alert and oriented to person, place, and time. Mental status is at baseline.     Motor: Tremor present. No atrophy.  Psychiatric:        Attention and Perception: Attention and perception normal.        Mood and Affect: Mood is anxious and depressed. Affect is tearful.        Speech: Speech normal.        Behavior: Behavior is withdrawn. Behavior is cooperative.        Thought Content: Thought content includes suicidal ideation.        Cognition and Memory: Cognition and memory normal.   Review of Systems  Neurological:  Positive for tremors, focal weakness and weakness.  Psychiatric/Behavioral:  Positive for depression, substance abuse (alcohol use, last drank 1 month ago) and suicidal ideas (suicide attempt). The patient is nervous/anxious and has insomnia (takes [place]).   All other systems reviewed and are negative. Blood pressure (!) 147/77, pulse  74, temperature 97.7 F (36.5 C), temperature source Oral, resp. rate 18, height 5' 10"  (1.778 m), weight 63.9 kg, SpO2 98 %. Body mass index is 20.21 kg/m.   Treatment Plan Summary: Daily contact with patient to assess and evaluate symptoms and progress in treatment, Medication management, and Plan     Recommended for inpatient psychiatric treatment. Patient unable to contract for safety.  -Patient is to remain under involuntary commitment at this time, as he continues to be a danger to himself and unable to contract for safety.   -Will decrease  Abilify  58m mg p.o. nightly to target psychosis, depressive symptoms, anxiety, and suicidal ideations.  -Will continue Klonopin 0.5 mg p.o. daily every morning; and Klonopin 1 mg p.o. nightly. -Will continue  mirtazapine 7.5 mg p.o. nightly.  If mirtazapine proven to be effective will begin to decrease his Ambien dose. -Patient was also increased on propranolol 237mp.o. 3 times daily. Insomnia-Patient continues to endorse poor sleep habits recommend documenting sleep hours byt night shift. -He is medically stable to discharge to inpatient psychiatric facility. Patient has been declined at multiple facilities [facility]. Patient with chronic suicidality, multiple suicide attempts of high lethality, no safe disposition plan at this time. WIll continue to work towards disposition for this patient will likely require long term care and supervision.  -Continue to use coping skills to help with behavior modification and anxiety reducing measures. Disposition: Recommend psychiatric Inpatient admission when medically cleared. IVC  TaSuella BroadFNP 07/28/2021 2:18 PM

## 2021-07-28 NOTE — TOC Progression Note (Signed)
Transition of Care [facility]) - Progression Note    Patient Details  Name: Anthony Lamb MRN: [id number] Date of Birth: 1955-08-12  Transition of Care [facility] CM/SW [place], [place] Phone Number: 07/28/2021, 2:49 PM  Clinical Narrative:   IVC renewed.  Due again on 3/1. Confirmed that patient is on waiting list for [facility]. TOC will continue to follow during the course of hospitalization.          Expected Discharge Plan and Services                                                 Social Determinants of Health (SDOH) Interventions    Readmission Risk Interventions No flowsheet data found.

## 2021-07-28 NOTE — Progress Notes (Addendum)
Patient is noted to be more anxious today by this nurse. His tremors are greater today than in the past and he keeps complaining of not being able to sit still and that it is an uncomfortable feeling to have the urge to continuously move. Notified Dr. Posey Pronto and NP [name]. Pt will be see later today by psych. Scheduled medications given to patient as scheduled and PRN medications given to patient as requested. Pt states that atarax makes him feel worse.

## 2021-07-28 NOTE — Progress Notes (Signed)
°   07/28/21 1100  Mobility  Activity Ambulated with assistance in hallway  Level of Assistance Modified independent, requires aide device or extra time  Assistive Device Front wheel walker  Distance Ambulated (ft) 300 ft  Activity Response Tolerated fair  $Mobility charge 1 Mobility   Pt agreeable to mobilize this morning. Ambulated about 39f in hall with RW, tolerated fair. He noted that this is "the worst" he's ever felt, that he "can't stay still in bed". NT/RN/MD aware of this Left pt in chair, call bell at side. RN/NT notified of session.   KLa LuisaSpecialist Acute Rehab Services Office: [phone]

## 2021-07-29 DIAGNOSIS — T50902A Poisoning by unspecified drugs, medicaments and biological substances, intentional self-harm, initial encounter: Secondary | ICD-10-CM | POA: Diagnosis not present

## 2021-07-29 DIAGNOSIS — T43202A Poisoning by unspecified antidepressants, intentional self-harm, initial encounter: Secondary | ICD-10-CM | POA: Diagnosis not present

## 2021-07-29 MED ORDER — ARIPIPRAZOLE 2 MG PO TABS
2.0000 mg | ORAL_TABLET | Freq: Every day | ORAL | Status: DC
  Administered 2021-07-29 – 2021-08-14 (×17): 2 mg via ORAL
  Filled 2021-07-29 (×17): qty 1

## 2021-07-29 NOTE — Consult Note (Signed)
[facility] Consult   Reason for Consult:  Suicide attempt by overdose on cymbalta Referring Physician:  Dr. 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 male patient admitted with suicide attempt by overdose.  Patient presented after overdosing on (60) 60 mg tablets of Cymbalta, he subsequently developed serotonin syndrome and severe hyponatremia that resulted in admission to intensive care for hyponatremia protocol.  07/29/2021: Patient seen and assessed today by the psychiatric nurse practitioner.  As with previous assessments and evaluations, patient continues to endorse no changes in his current clinical presentation.  Today he remained preoccupied and tangential on believing that he has cancer.  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  He is regretful that he did not pursue outpatient services when given the opportunity, and now thinks the cancer has spread and he is going to die.  He also continues to rate his anxiety 10 out of 10, with 10 being the worst on a Likert scale.  Patient is again reassured that he is on multiple classes of medication to help manage anxiety to include propranolol, mirtazapine, clonazepam, and Abilify.  He did not respond well to buspirone and hydroxyzine, those medications were discontinued.  Patient denies suicidal ideations to this nurse practitioner, however after  stepping outside of the room he told the sitter" as she did just die when I had the chance."  This nurse practitioner once again offered additional coping skills, patient was more receptive today.  He did allow and set up coping skill resources and worksheets, to help with relaxation and anxiety.  This nurse practitioner did print off for worksheets and handouts (1 of which was 100 coping skills..  In which patient states he cant see it then begins to say he is going blind).  We will continue to monitor.  07/28/2021 Patient is seen and reassessed by this nurse practitioner.  Case discussed with Dr. Dwyane Dee. Today patients appears to be preoccupied on his anxiety and tremors. He is unable to complete a full thought as he is so distracted by his anxiety and tremors. He begins to ask for prn medications via IV to help with his medications. Attempted to shift the discussion to previous Coping skills that will help with management of his anxiety.  Attempted to reflect on previous hospitalizations in which he received treatment for coping skills, behavior modifications, and other treatment modalities.  He states walking down the halls and deep breathing seemed to help.  He is encouraged to continue to focus on these coping skills, also offered to take him outdoors or print off a list of coping skills in which she declined both.  He does state that this is the worst he has ever felt, and suspects it is contributing to his hydroxyzine in which he took at 6 AM this morning.  Patient states his tremors are new, despite previous clinical documentation shows patient has tremors at rest.  He was  previously started on mirtazapine last week, and noted that this medication did help to improve his sleep and was effective.  When assessing his sleep last night, he declines stating he did not sleep at all.  He then begins to perseverate about Ambien, and not to discontinue this medication.  Patient is a poor historian and unclear at  times when his story is fabricated as he states" when I would come into the ED with those tremors, I will be faking.  I made it all up. " He denies any new side effects, adverse reactions, muscle stiffness, confusion or disorientation at this time. WIll continue to recommend inpatient psych at this time.   Patient is now medically stable discharge to inpatient general psych hospital for crisis stabilization, medication management, and behavior modification. Patient has been declined at multiple acute inpatient facilities, suspect underlying barrier is a diagnosis of dementia in the context of alcohol withdraw and Delirium tremens. Patient has a significant history of alcohol abuse, that was treated with  long term use of benzodiazepines which has now developed into severe dependency.   HPI:  Anthony Lamb is a 66 y.o. male with a past medical history significant for hypertension, GERD, peptic ulcer disease, ankylosing spondylitis, irritable bowel syndrome, chronic pain, anxiety, and depression who presents for suicide attempt by overdose.  According to EMS and patient, at 545 this morning, patient tried to overdose by taking 60 duloxetine tablets.  EMS thought that they were 60 mg tablets by report.  Patient reports she is feeling some waxing waning nausea, lightheadedness, and feels like he has no bowel movement.  He feels very dehydrated and tired.  He also is feeling very anxious and agitated.  He still says he wants to kill himself.  He reports that he was discharged from the emergency department yesterday at [facility] and he still says he is going to try to kill himself he leaves again today.  Past Psychiatric History: Suicidal ideations, generalized anxiety, major depressive disorder recurrent with atypical features, alcohol abuse, benzodiazepine use disorder severe  Risk to Self: Yes Risk to Others: Denies Prior Inpatient Therapy: Yes most recent [facility] December 2022 Prior Outpatient Therapy:  Denies currently followed by Dr. Nancy Fetter at [facility].  Past Medical History:  Past Medical History:  Diagnosis Date   Alcohol abuse, in remission    Anal fissure    Anemia    Ankylosing spondylitis (HCC)    Anxiety    Arthritis    Chronic diarrhea    Chronic headaches    Chronic pain    Colitis    Colon polyps    Depression    Esophagitis    Gastric polyps    hyperplastic and fundic gland   Gastritis    GERD (gastroesophageal reflux disease)    Hypertension    IBS (irritable bowel syndrome)    Poor dentition    SIADH (syndrome of inappropriate ADH production) ([place])     Past Surgical History:  Procedure Laterality Date   BIOPSY  11/26/2018   Procedure: BIOPSY;  Surgeon: Gatha Mayer, MD;  Location: [facility];  Service: Endoscopy;;   COLONOSCOPY W/ BIOPSIES     COLONOSCOPY WITH PROPOFOL N/A 11/26/2018   Procedure: COLONOSCOPY WITH PROPOFOL;  Surgeon: Gatha Mayer, MD;  Location: [facility];  Service: Endoscopy;  Laterality: N/A;   ESOPHAGOGASTRODUODENOSCOPY     ESOPHAGOGASTRODUODENOSCOPY (EGD) WITH PROPOFOL N/A 11/26/2018   Procedure: ESOPHAGOGASTRODUODENOSCOPY (EGD) WITH PROPOFOL;  Surgeon: Gatha Mayer,  MD;  Location: [facility];  Service: Endoscopy;  Laterality: N/A;   HEMOSTASIS CLIP PLACEMENT  11/26/2018   Procedure: HEMOSTASIS CLIP PLACEMENT;  Surgeon: Gatha Mayer, MD;  Location: [facility];  Service: Endoscopy;;   HERNIA REPAIR Bilateral    HOT HEMOSTASIS N/A 11/26/2018   Procedure: HOT HEMOSTASIS (ARGON PLASMA COAGULATION/BICAP);  Surgeon: Gatha Mayer, MD;  Location: [facility];  Service: Endoscopy;  Laterality: N/A;   OPEN REDUCTION INTERNAL FIXATION (ORIF) DISTAL RADIAL FRACTURE Right 02/11/2018   Procedure: OPEN REDUCTION INTERNAL FIXATION (ORIF) DISTAL RADIAL FRACTURE;  Surgeon: Milly Jakob, MD;  Location: [facility];  Service: Orthopedics;  Laterality: Right;   POLYPECTOMY  11/26/2018   Procedure: POLYPECTOMY;  Surgeon: Gatha Mayer, MD;  Location: [facility];  Service: Endoscopy;;   TONSILLECTOMY     Family History:  Family History  Problem Relation Age of Onset   Anxiety disorder Mother    Congestive Heart Failure Mother    Crohn's disease Mother    Colon cancer Mother 74   Arthritis Father    High blood pressure Father    Crohn's disease Father    Family Psychiatric  History: Denies Social History:  Social History   Substance and Sexual Activity  Alcohol Use Not Currently     Social History   Substance and Sexual Activity  Drug Use Not Currently   Types: Marijuana    Social History   Socioeconomic History   Marital status: Single    Spouse name: Not on file   Number of children: 0   Years of education: Not on file   Highest education level: Not on file  Occupational History   Not on file  Tobacco Use   Smoking status: Former    Types: Cigarettes    Quit date: 2014    Years since quitting: 9.1   Smokeless tobacco: Never  Vaping Use   Vaping Use: Never used  Substance and Sexual Activity   Alcohol use: Not Currently   Drug use: Not Currently    Types: Marijuana   Sexual activity: Not Currently  Other Topics Concern   Not on file  Social History Narrative   HSG. Long - term disability - unable to work. Lived with his mother in her house - she died 04-22-2023 -    Lives in apartment   Has case worker   Social Determinants of Radio broadcast assistant Strain: Not on file  Food Insecurity: Not on file  Transportation Needs: Not on file  Physical Activity: Not on file  Stress: Not on file  Social Connections: Not on file   Additional Social History:    Allergies:   Allergies  Allergen Reactions   Nsaids Other (See Comments)    GI upset- history of peptic ulcers!!   Diphenhydramine Hcl Other (See Comments)    Restlessness   Flexeril [Cyclobenzaprine] Other (See Comments)    Restlessness   Fluoxetine Other (See Comments)    Made the patient feel "worse than before"  (treatment)   Hctz [Hydrochlorothiazide] Other (See Comments)    Caused to lose sodium when taking with Lisinopril   Rexulti [Brexpiprazole] Other (See Comments)    "Made me not feel right- restless"   Seroquel [Quetiapine] Other (See Comments)    Restless legs and makes the patient sweat- also does not help patient's insomnia. Pt takes this at home in 2023.   Gabapentin Diarrhea   Hydroxyzine Other (See Comments)    Restlessness  Labs:  Results for orders placed or performed during the hospital encounter of 07/01/21 (from the past 48 hour(s))  CBC     Status: Abnormal   Collection Time: 07/28/21  9:15 AM  Result Value Ref Range   WBC 7.2 4.0 - 10.5 K/uL   RBC 3.66 (L) 4.22 - 5.81 MIL/uL   Hemoglobin 11.7 (L) 13.0 - 17.0 g/dL   HCT 34.9 (L) 39.0 - 52.0 %   MCV 95.4 80.0 - 100.0 fL   MCH 32.0 26.0 - 34.0 pg   MCHC 33.5 30.0 - 36.0 g/dL   RDW 13.1 11.5 - 15.5 %   Platelets 355 150 - 400 K/uL   nRBC 0.0 0.0 - 0.2 %    Comment: Performed at [facility], [place] [address] [place], [place] [address]  Basic metabolic panel     Status: Abnormal   Collection Time: 07/28/21  9:15 AM  Result Value Ref Range   Sodium 131 (L) 135 - 145 mmol/L   Potassium 4.2 3.5 - 5.1 mmol/L   Chloride 95 (L) 98 - 111 mmol/L   CO2 29 22 - 32 mmol/L   Glucose, Bld 84 70 - 99 mg/dL    Comment: Glucose reference range applies only to samples taken after fasting for at least 8 hours.   BUN 8 8 - 23 mg/dL   Creatinine, Ser 0.55 (L) 0.61 - 1.24 mg/dL   Calcium 8.9 8.9 - 10.3 mg/dL   GFR, Estimated >60 >60 mL/min    Comment: (NOTE) Calculated using the CKD-EPI Creatinine Equation (2021)    Anion gap 7 5 - 15    Comment: Performed at [facility], [place] [address], [place], [place] [address]      Current Facility-Administered Medications  Medication Dose Route Frequency Provider Last Rate Last Admin   0.9 %  sodium chloride infusion   Intravenous PRN Dwyane Dee,  MD 10 mL/hr at 07/23/21 0600 Infusion Verify at 07/23/21 0600   acetaminophen (TYLENOL) tablet 650 mg  650 mg Oral Q4H PRN Dwyane Dee, MD   650 mg at 07/29/21 1320   alum & mag hydroxide-simeth (MAALOX/MYLANTA) 200-200-20 MG/5ML suspension 30 mL  30 mL Oral Q4H PRN Dwyane Dee, MD   30 mL at 07/22/21 0850   ARIPiprazole (ABILIFY) tablet 2 mg  2 mg Oral QHS Starkes-Perry, Gayland Curry, FNP       Chlorhexidine Gluconate Cloth 2 % PADS 6 each  6 each Topical Daily Dwyane Dee, MD   6 each at 07/29/21 0835   clonazePAM (KLONOPIN) tablet 0.5 mg  0.5 mg Oral Q breakfast Suella Broad, FNP   0.5 mg at 07/29/21 3833   And   clonazePAM (KLONOPIN) tablet 1 mg  1 mg Oral QHS Suella Broad, FNP   1 mg at 07/28/21 2115   enoxaparin (LOVENOX) injection 40 mg  40 mg Subcutaneous Q24H Dwyane Dee, MD   40 mg at 07/28/21 2114   hydrALAZINE (APRESOLINE) tablet 25 mg  25 mg Oral Q4H PRN Dwyane Dee, MD   25 mg at 07/13/21 1701   lisinopril (ZESTRIL) tablet 30 mg  30 mg Oral Daily Dwyane Dee, MD   30 mg at 07/29/21 3832   loperamide (IMODIUM) capsule 2 mg  2 mg Oral PRN Phillips Grout, MD   2 mg at 07/29/21 1618   loratadine (CLARITIN) tablet 10 mg  10 mg Oral Daily Manuella Ghazi, Pratik D, DO   10 mg at 07/29/21 [phone]  MEDLINE mouth rinse  15 mL Mouth Rinse BID Dwyane Dee, MD   15 mL at 07/29/21 0836   mirtazapine (REMERON) tablet 7.5 mg  7.5 mg Oral QHS Suella Broad, FNP   7.5 mg at 07/28/21 2114   naphazoline-pheniramine (NAPHCON-A) 0.025-0.3 % ophthalmic solution 1 drop  1 drop Both Eyes QID PRN Heath Lark D, DO   1 drop at 07/19/21 1750   oxyCODONE (Oxy IR/ROXICODONE) immediate release tablet 5 mg  5 mg Oral Q12H PRN Manuella Ghazi, Pratik D, DO   5 mg at 07/29/21 3825   pantoprazole (PROTONIX) EC tablet 40 mg  40 mg Oral QAC breakfast Dwyane Dee, MD   40 mg at 07/29/21 0834   phenol (CHLORASEPTIC) mouth spray 1 spray  1 spray Mouth/Throat PRN Dwyane Dee, MD   1 spray at  07/11/21 2054   polyethylene glycol (MIRALAX / GLYCOLAX) packet 17 g  17 g Oral Daily PRN Dwyane Dee, MD   17 g at 07/06/21 0539   polyvinyl alcohol (LIQUIFILM TEARS) 1.4 % ophthalmic solution 1 drop  1 drop Both Eyes PRN Jennye Boroughs, MD   1 drop at 07/17/21 2106   potassium chloride SA (KLOR-CON M) CR tablet 20 mEq  20 mEq Oral BID Derrill Kay A, MD   20 mEq at 07/29/21 7673   propranolol (INDERAL) tablet 20 mg  20 mg Oral TID Mariel Aloe, MD   20 mg at 07/29/21 1615   sodium chloride (OCEAN) 0.65 % nasal spray 1 spray  1 spray Each Nare PRN Dwyane Dee, MD   1 spray at 07/25/21 0920   sodium chloride flush (NS) 0.9 % injection 10-40 mL  10-40 mL Intracatheter Q12H Dwyane Dee, MD   10 mL at 07/29/21 4193   sodium chloride flush (NS) 0.9 % injection 10-40 mL  10-40 mL Intracatheter PRN Dwyane Dee, MD       sucralfate (CARAFATE) tablet 1 g  1 g Oral QID Dwyane Dee, MD   1 g at 07/29/21 1322   zolpidem (AMBIEN) tablet 5 mg  5 mg Oral Standley Brooking, MD   5 mg at 07/28/21 2115    Musculoskeletal: Strength & Muscle Tone: within normal limits Gait & Station: normal Patient leans: N/A     Psychiatric Specialty Exam:  Presentation  General Appearance: Appropriate for Environment; Casual  Eye Contact:Fair  Speech:Clear and Coherent; Normal Rate  Speech Volume:Normal  Handedness:Right   Mood and Affect  Mood:Depressed  Affect:Appropriate; Congruent   Thought Process  Thought Processes:Coherent; Linear  Descriptions of Associations:Intact  Orientation:Full (Time, Place and Person)  Thought Content:Logical  History of Schizophrenia/Schizoaffective disorder:No  Duration of Psychotic Symptoms:N/A  Hallucinations:No data recorded  Ideas of Reference:None  Suicidal Thoughts:No data recorded  Homicidal Thoughts:No data recorded   Sensorium  Memory:Immediate Fair; Recent Fair; Remote Fair  Judgment:Fair  Insight:Fair   Executive  Functions  Concentration:Fair  Attention Span:Fair  [place]   Psychomotor Activity  Psychomotor Activity:No data recorded   Assets  Assets:Communication Skills; Financial Resources/Insurance; Housing; Social Support; Physical Health   Sleep  Sleep:No data recorded   Physical Exam: Physical Exam Vitals and nursing note reviewed.  Constitutional:      Appearance: Normal appearance. He is normal weight.  HENT:     Head: Normocephalic.  Eyes:     General: Lids are normal.  Neurological:     General: No focal deficit present.     Mental Status: He is alert  and oriented to person, place, and time. Mental status is at baseline.     Motor: Tremor present. No atrophy.  Psychiatric:        Attention and Perception: Attention and perception normal.        Mood and Affect: Mood is anxious and depressed. Affect is tearful.        Speech: Speech normal.        Behavior: Behavior is withdrawn. Behavior is cooperative.        Thought Content: Thought content includes suicidal ideation.        Cognition and Memory: Cognition and memory normal.   Review of Systems  Neurological:  Positive for tremors, focal weakness and weakness.  Psychiatric/Behavioral:  Positive for depression, substance abuse (alcohol use, last drank 1 month ago) and suicidal ideas (suicide attempt). The patient is nervous/anxious and has insomnia (takes [place]).   All other systems reviewed and are negative. Blood pressure 133/73, pulse 71, temperature 97.8 F (36.6 C), temperature source Oral, resp. rate 18, height 5' 10"  (1.778 m), weight 63.9 kg, SpO2 98 %. Body mass index is 20.21 kg/m.   Treatment Plan Summary: Daily contact with patient to assess and evaluate symptoms and progress in treatment, Medication management, and Plan     Recommended for inpatient psychiatric treatment. Patient unable to contract for safety.  -Patient is to remain under involuntary  commitment at this time, as he continues to be a danger to himself and unable to contract for safety.   -Will decrease  Abilify 61m mg p.o. nightly to target psychosis, depressive symptoms, anxiety, and suicidal ideations.  -Will continue Klonopin 0.5 mg p.o. daily every morning; and Klonopin 1 mg p.o. nightly. -Will continue  mirtazapine 7.5 mg p.o. nightly.  If mirtazapine proven to be effective will begin to decrease his Ambien dose. -Patient was also increased on propranolol 243mp.o. 3 times daily. Insomnia-Patient continues to endorse poor sleep habits recommend documenting sleep hours byt night shift. -He is medically stable to discharge to inpatient psychiatric facility. Patient has been declined at multiple facilities [facility] Patient with chronic suicidality, multiple suicide attempts of high lethality, no safe disposition plan at this time. WIll continue to work towards disposition for this patient will likely require long term care and supervision.  -Continue to use coping skills to help with behavior modification and anxiety reducing measures.  Psychiatry to re assess on Monday.  If patient presents with any new concerns, please message psychiatry on call.  However patient will continue to endorse abnormally high levels of anxiety, that has been very difficult to manage.  If this is the case please encourage patient to use coping skills.  Disposition: Recommend psychiatric Inpatient admission when medically cleared. IVC  TaSuella BroadFNP 07/29/2021 4:23 PM

## 2021-07-29 NOTE — Progress Notes (Signed)
[facility] PROGRESS NOTE  Patient: YAMA NIELSON LNZ:[id number]   PCP: Sandi Mariscal, MD DOB: Jun 19, 1955   DOA: 07/01/2021   DOS: 07/29/2021    Subjective: Continues to report diarrhea.  No nausea no vomiting.  Oral intake adequate.  Objective:  Vitals:   07/29/21 0625 07/29/21 1540  BP: 124/65 133/73  Pulse: 62 71  Resp: 17 18  Temp: (!) 97.2 F (36.2 C) 97.8 F (36.6 C)  SpO2: 99% 98%    S1-S2 present.  Clear to auscultation.  Bowel sound present.  No tenderness.  Assessment and plan: Suicidal ideation. Major depression. Appreciate psychiatry consultation. Continue current management.  Author: Berle Mull, MD [facility] 07/29/2021 6:46 PM   If 7PM-7AM, please contact night-coverage at [email or web address]

## 2021-07-30 MED ORDER — SACCHAROMYCES BOULARDII 250 MG PO CAPS
250.0000 mg | ORAL_CAPSULE | Freq: Two times a day (BID) | ORAL | Status: DC
Start: 2021-07-30 — End: 2021-08-22
  Administered 2021-07-30 – 2021-08-22 (×45): 250 mg via ORAL
  Filled 2021-07-30 (×44): qty 1

## 2021-07-30 NOTE — Progress Notes (Signed)
[facility] PROGRESS NOTE  Patient: Anthony Lamb OVF:[id number]   PCP: Sandi Mariscal, MD DOB: 01-Mar-1956   DOA: 07/01/2021   DOS: 07/30/2021    Subjective: Reports diarrhea although RN reported formed stool.  Reports inability to stand still reports severe anxiety.  Objective:  Vitals:   07/30/21 0809 07/30/21 1210  BP: 133/76 124/73  Pulse: 85 61  Resp: 18 18  Temp: 98.2 F (36.8 C) 98.6 F (37 C)  SpO2: 100% 98%    S1-S2 present. Bowel sound present. Has tremors.  Assessment and plan: Reported diarrhea. So far has not been verified despite patient's multiple complaints of diarrhea. I will discontinue the Imodium. Add a probiotic.  Author: Berle Mull, MD [facility] 07/30/2021 8:34 PM   If 7PM-7AM, please contact night-coverage at [email or web address]

## 2021-07-30 NOTE — Progress Notes (Signed)
°   07/30/21 1248  Mobility  Activity Ambulated with assistance in hallway  Level of Assistance Standby assist, set-up cues, supervision of patient - no hands on  Assistive Device Front wheel walker  Distance Ambulated (ft) 300 ft  Activity Response Tolerated well  $Mobility charge 1 Mobility   Pt agreeable to mobilize this morning. Ambulated about 322f in hall with RW, tolerated well. No complaints. Left pt in bed, call bell at side. RN/NT notified of session.   KRooseveltSpecialist Acute Rehab Services Office: [phone]

## 2021-07-31 DIAGNOSIS — T43204A Poisoning by unspecified antidepressants, undetermined, initial encounter: Secondary | ICD-10-CM | POA: Diagnosis not present

## 2021-07-31 MED ORDER — PANTOPRAZOLE SODIUM 40 MG PO TBEC
40.0000 mg | DELAYED_RELEASE_TABLET | Freq: Two times a day (BID) | ORAL | Status: DC
  Administered 2021-07-31 – 2021-08-22 (×44): 40 mg via ORAL
  Filled 2021-07-31 (×44): qty 1

## 2021-07-31 MED ORDER — SIMETHICONE 80 MG PO CHEW
80.0000 mg | CHEWABLE_TABLET | Freq: Four times a day (QID) | ORAL | Status: DC
  Administered 2021-07-31 – 2021-08-22 (×87): 80 mg via ORAL
  Filled 2021-07-31 (×87): qty 1

## 2021-07-31 NOTE — Progress Notes (Signed)
[facility] PROGRESS NOTE  Patient: Anthony Lamb NEY:[id number]   PCP: Sandi Mariscal, MD DOB: 1955/12/08   DOA: 07/01/2021   DOS: 07/31/2021    Subjective: Reported some chest pain improved with Gas-X.  No nausea no vomiting.  No fever no chills.  No shortness of breath.  Objective:  Vitals:   07/31/21 1051 07/31/21 1332  BP: (!) 151/72 127/68  Pulse: 68 64  Resp: 20 20  Temp: 97.7 F (36.5 C) (!) 97.5 F (36.4 C)  SpO2: 100% 100%    Clear to auscultation. S1-S2 present. EKG independently reviewed.  Normal sinus rhythm without any evidence of acute ischemia.  Assessment and plan: Chest pain. Most likely GI in origin.  Increase PPI to twice daily as well as add Gas-X.  Continue other regimen.  Suicidal ideation. Landscape architect.  Author: Berle Mull, MD [facility] 07/31/2021 8:00 PM   If 7PM-7AM, please contact night-coverage at [email or web address]

## 2021-08-01 DIAGNOSIS — T43204A Poisoning by unspecified antidepressants, undetermined, initial encounter: Secondary | ICD-10-CM | POA: Diagnosis not present

## 2021-08-01 NOTE — Progress Notes (Addendum)
[facility] PROGRESS NOTE  Patient: Anthony Lamb NOP:[id number]   PCP: Sandi Mariscal, MD DOB: 04-29-1956   DOA: 07/01/2021   DOS: 08/01/2021    Subjective: No nausea or vomiting.  Concerned with diarrhea although no documented diarrhea.  No abdominal pain.  Objective:  Vitals:   08/01/21 1500 08/01/21 1646  BP: 120/62 120/65  Pulse: 67 72  Resp: 18   Temp: 98 F (36.7 C)   SpO2: 100% 100%    S1-S2 present. Clear to auscultation. Bowel sound present.  No tenderness. No edema  Assessment and plan: Anxiety No further change in medication unless psychiatry recommends any changes.  GI issues. Continue Carafate.  Gas-X.  Continue PPI twice daily. Use Imodium only if the patient actually has any visible documented diarrhea.  Medically stable for discharge.   Author: Berle Mull, MD [facility] 08/01/2021 7:16 PM   If 7PM-7AM, please contact night-coverage at [email or web address]

## 2021-08-01 NOTE — Progress Notes (Signed)
Patient anxiety is still high. He is still worried about his loose stools, anxiety, and pain. He verbalized being worried about where he will live when he is discharged. Ask several times this morning if he would be leaving today. I informed him that at this time there is no update on a facility or bed.

## 2021-08-02 DIAGNOSIS — T43202A Poisoning by unspecified antidepressants, intentional self-harm, initial encounter: Secondary | ICD-10-CM | POA: Diagnosis not present

## 2021-08-02 DIAGNOSIS — T50902A Poisoning by unspecified drugs, medicaments and biological substances, intentional self-harm, initial encounter: Secondary | ICD-10-CM | POA: Diagnosis not present

## 2021-08-02 LAB — BASIC METABOLIC PANEL
Anion gap: 4 — ABNORMAL LOW (ref 5–15)
BUN: 9 mg/dL (ref 8–23)
CO2: 32 mmol/L (ref 22–32)
Calcium: 8.9 mg/dL (ref 8.9–10.3)
Chloride: 97 mmol/L — ABNORMAL LOW (ref 98–111)
Creatinine, Ser: 0.7 mg/dL (ref 0.61–1.24)
GFR, Estimated: >60 mL/min (ref >60)
Glucose, Bld: 93 mg/dL (ref 70–99)
Potassium: 4.4 mmol/L (ref 3.5–5.1)
Sodium: 133 mmol/L — ABNORMAL LOW (ref 135–145)

## 2021-08-02 MED ORDER — ENSURE ENLIVE PO LIQD
237.0000 mL | Freq: Three times a day (TID) | ORAL | Status: DC
  Administered 2021-08-02 – 2021-08-22 (×56): 237 mL via ORAL

## 2021-08-02 NOTE — Progress Notes (Signed)
Progress Note   Patient: Anthony Lamb EGB:[id number] DOB: 03-11-1956 DOA: 07/01/2021     Hospitalization day: 90 DOS: the patient was seen and examined on 08/02/2021   Brief hospital course: Anthony Lamb is a 66 year old male with history of EtOH abuse, ankylosing spondylitis, anxiety, depression, history of colitis and chronic diarrhea, chronic headaches, chronic pain, HTN who presented to the hospital after intentional overdose of approximately 60 tablets of duloxetine.   There were multiple ER visits since September 2022 for anxiety, suicidal ideation, pain, and other issues. 1/27 admitted to the hospital for suicidal drug overdose. 1/28 started on Molnupiravir for incidental COVID-19 infection 1/29 transferred to stepdown unit for 3% hypertonic saline for hyponatremia. 2/2 PICC line placed for hypertonic saline 2/7, deemed medically stable to transfer to inpatient psychiatric facility. 2/25 PICC line removed. Remains medically stable as of 2/28.  Assessment and Plan: * Overdose of antidepressant, intentional self-harm, initial encounter ([place])- (present on admission) SUICIDAL attempt Patient presented after overdosing on (60) 60 mg tablets of Cymbalta.  Consulted with poison control on admission Psychiatry consulted and have recommended inpatient behavioral health admission. On involuntary commitment at this time, as he is a danger to himself and unable to contract for safety. Has a longstanding history of chronic benzodiazepine use likely secondary to alcohol use disorder, as noted above his behaviors on now extreme and is unable to cope without this medication. Psychiatry recommended to start patient on a benzodiazepine taper, they have notified all parties involved in prescribing the med to pt.  Continue one-to-one sitter. Medically stable for discharge.  Tremor- (present on admission) Chronic issue. Started propranolol -Continue propranolol, titrate as  needed/tolerated  Major depression, recurrent ([place])- (present on admission) Psych following intermittently  Patient with chronic suicidality, multiple suicide attempts of high lethality Continue to have subjective symptoms of diarrhea, chest pain, poor PO intake, tremors, inability to sleep without much objective finidngs.  -Klonopin 0.5 mg p.o. daily every morning; and Klonopin 1 mg p.o. nightly. Psychiatry will likely reduce klonopin taper next week. -mirtazapine 7.5 mg p.o. nightly for insomnia -Propranolol 79m p.o. 3 times daily for tremors and anxiety -Abilify 220mmg p.o. nightly for Psychotic symptoms.   He is medically stable to discharge to inpatient psychiatric facility. [facility].  Gastroesophageal reflux disease- (present on admission) -Continue protonix , carafete, simethicone, probiotics.  PRN Maalox and miralax.   SIADH (syndrome of inappropriate ADH production) [place] (present on admission) In the setting of duloxetine overdose likely in a patient with underlying mild hyponatremia from EtOH use. Treated with 3% saline.  Now normal and stable without any intervention.  Allergic reaction Unsure of etiology. Occurred on 2/3. Resolved with Benadryl, Pepcid and Solu-medrol.  COVID-19 virus infection- (present on admission) Diagnosed on 07/01/21. Incidental.  Patient treated with molnupiravir and completed course.  Isolation discontinued.  Alcohol abuse- (present on admission) Noted.  Chest pain Not typical. Multiple EKG normal. Likely GI in nature, Also likely heavily contributed to by anxiety. Possibly musculoskeletal since there is an aspect of reproducibility. Continue to monitor.   Ankylosing spondylitis ([place] (present on admission) Seen on x ray, known past history.   Essential hypertension- (present on admission) On verapamil, lisinopril and inderal outpatient Now stable.  -Continue lisinopril and propranolol    Subjective:  No nausea, vomiting no fever no chills.  No chest pain abdominal pain.  Physical Exam: Vitals:   08/02/21 0800 08/02/21 1000 08/02/21 1430 08/02/21 1719  BP:  127/66 132/72 (!Marland Kitchen  149/86  Pulse:  76 78 78  Resp:   16   Temp:   97.9 F (36.6 C)   TempSrc:      SpO2:  100% 99%   Weight: 66.7 kg     Height:       General: Appear in mild distress; no visible Abnormal Neck Mass Or lumps, Conjunctiva normal Cardiovascular: S1 and S2 Present, no Murmur, Respiratory: good respiratory effort, Bilateral Air entry present and CTA, no Crackles, no wheezes Abdomen: Bowel Sound present Extremities: no Pedal edema Neurology: alert and oriented to time, place, and person Gait not checked due to patient safety concerns   Data Reviewed:  I have Reviewed nursing notes, Vitals, and Lab results since pt's last encounter. Pertinent lab results BMP shows normal sodium normal creatinine.    Family Communication: None at bedside  Disposition: Status is: Inpatient Remains inpatient appropriate because: Unsafe discharge.  Currently IVC.  Requires inpatient psychiatric admission.  Referral made to CRS.  Refused by multiple psych facilities so far.  Author: Berle Mull, MD 08/02/2021 5:33 PM  For on call review [email or web address].

## 2021-08-02 NOTE — Consult Note (Signed)
[facility]iatry Consult   Reason for Consult:  Suicide attempt by overdose on cymbalta Referring Physician:  Dr. 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 male patient admitted with suicide attempt by overdose.  Patient presented after overdosing on (60) 60 mg tablets of Cymbalta, he subsequently developed serotonin syndrome and severe hyponatremia that resulted in admission to intensive care for hyponatremia protocol.  08/02/2021: Patient seen and assessed today by the psychiatric nurse practitioner. On todays evaluation he is observed to be resting in bed, and seemingly doing well. However he denies this and reports that he is "not doing good. My anxiety is high and I think I need an additional dose of ativan or klonopin. I cant sleep at night. " As with previous assessments and evaluations, patient continues to endorse no changes in his current clinical presentation.  THroughout majority of the reassessment patient focused on the " I cant ", we have a very extensive discussion about his negative thoughts and excuses. We reviewed a list of coping skills, Stop sign and blocking of negative thoughts. Anthony Lamb is also praised for his good behaviors and attempting to use some coping skills such as walking and deep breathing. He is empowered to believe that he can stop and think before he acts and slow down his excessive worrying. He is encouraged to practice his breathing exercises for anxiety  reduction. He is encouraged to practice his "Stop sign" skill to re-inforce his ability to think before he worries about things that are out of his control. And he is strongly encouraged to avoid suicidal statements with no true intent. Further discussion held about suicidal attempt, and his suicidal statements are resulting in long term hospitalization.  Did reinforce again that communicating suicidal threats is criteria for hospitalization, long term hospitalization. He voices understanding. Anthony Lamb is reminded again to think before he acts and he can be a better person going forward. He denies suicidal ideations, homicidal ideations and or hallucinations at this time.   Patient is now medically stable discharge to inpatient general psych hospital for crisis stabilization, medication management, and behavior modification. Patient has been declined at multiple acute inpatient facilities, suspect underlying barrier is a diagnosis of dementia in the context of alcohol withdraw and Delirium tremens. Patient has a significant history of alcohol abuse, that was treated with  long term use of benzodiazepines which has now developed into severe dependency.   HPI:  Anthony Lamb is a 66 y.o. male with a past medical history significant for hypertension, GERD, peptic ulcer disease, ankylosing spondylitis, irritable bowel syndrome, chronic pain, anxiety, and depression who presents for suicide attempt by overdose.  According to EMS and patient, at 545 this morning, patient tried to overdose by taking 60 duloxetine tablets.  EMS thought that they were 60 mg tablets by report.  Patient reports she is feeling some waxing waning nausea, lightheadedness, and feels like he has no bowel movement.  He feels very dehydrated and tired.  He also is feeling very anxious and agitated.  He  still says he wants to kill himself.  He reports that he was discharged from the emergency department yesterday at [facility] and he still says  he is going to try to kill himself he leaves again today.  Past Psychiatric History: Suicidal ideations, generalized anxiety, major depressive disorder recurrent with atypical features, alcohol abuse, benzodiazepine use disorder severe  Risk to Self: Yes Risk to Others: Denies Prior Inpatient Therapy: Yes most recent [facility] December 2022 Prior Outpatient Therapy: Denies currently followed by Dr. Nancy Fetter [facility].  Past Medical History:  Past Medical History:  Diagnosis Date   Alcohol abuse, in remission    Anal fissure    Anemia    Ankylosing spondylitis (HCC)    Anxiety    Arthritis    Chronic diarrhea    Chronic headaches    Chronic pain    Colitis    Colon polyps    Depression    Esophagitis    Gastric polyps    hyperplastic and fundic gland   Gastritis    GERD (gastroesophageal reflux disease)    Hypertension    IBS (irritable bowel syndrome)    Poor dentition    SIADH (syndrome of inappropriate ADH production) ([place])     Past Surgical History:  Procedure Laterality Date   BIOPSY  11/26/2018   Procedure: BIOPSY;  Surgeon: Gatha Mayer, MD;  Location: [facility];  Service: Endoscopy;;   COLONOSCOPY W/ BIOPSIES     COLONOSCOPY WITH PROPOFOL N/A 11/26/2018   Procedure: COLONOSCOPY WITH PROPOFOL;  Surgeon: Gatha Mayer, MD;  Location: [facility];  Service: Endoscopy;  Laterality: N/A;   ESOPHAGOGASTRODUODENOSCOPY     ESOPHAGOGASTRODUODENOSCOPY (EGD) WITH PROPOFOL N/A 11/26/2018   Procedure: ESOPHAGOGASTRODUODENOSCOPY (EGD) WITH PROPOFOL;  Surgeon: Gatha Mayer, MD;  Location: [facility];  Service: Endoscopy;  Laterality: N/A;   HEMOSTASIS CLIP PLACEMENT  11/26/2018   Procedure: HEMOSTASIS CLIP PLACEMENT;  Surgeon: Gatha Mayer, MD;  Location: [facility];  Service: Endoscopy;;   HERNIA REPAIR Bilateral    HOT HEMOSTASIS N/A 11/26/2018   Procedure: HOT HEMOSTASIS (ARGON PLASMA COAGULATION/BICAP);  Surgeon: Gatha Mayer, MD;  [facility];  Service: Endoscopy;  Laterality: N/A;   OPEN REDUCTION INTERNAL FIXATION (ORIF) DISTAL RADIAL FRACTURE Right 02/11/2018   Procedure: OPEN REDUCTION INTERNAL FIXATION (ORIF) DISTAL RADIAL FRACTURE;  Surgeon: Milly Jakob, MD;  Location: [facility];  Service: Orthopedics;  Laterality: Right;   POLYPECTOMY  11/26/2018   Procedure: POLYPECTOMY;  Surgeon: Gatha Mayer, MD;  Location[facility];  Service: Endoscopy;;   TONSILLECTOMY     Family History:  Family History  Problem Relation Age of Onset   Anxiety disorder Mother    Congestive Heart Failure Mother    Crohn's disease Mother    Colon cancer Mother 46   Arthritis Father    High blood pressure Father    Crohn's disease Father    Family Psychiatric  History: Denies Social History:  Social History   Substance and Sexual Activity  Alcohol Use Not Currently     Social History   Substance and Sexual Activity  Drug Use Not Currently   Types: Marijuana    Social History   Socioeconomic History   Marital status: Single    Spouse name: Not on file   Number of children: 0   Years of education: Not on file   Highest education level: Not on file  Occupational History   Not on file  Tobacco Use  Smoking status: Former    Types: Cigarettes    Quit date: 2014    Years since quitting: 9.1   Smokeless tobacco: Never  Vaping Use   Vaping Use: Never used  Substance and Sexual Activity   Alcohol use: Not Currently   Drug use: Not Currently    Types: Marijuana   Sexual activity: Not Currently  Other Topics Concern   Not on file  Social History Narrative   HSG. Long - term disability - unable to work. Lived with his mother in her house - she died 17-May-2023 -    Lives in apartment   Has case worker   Social Determinants of Radio broadcast assistant Strain: Not on file  Food Insecurity: Not on file  Transportation Needs: Not on file  Physical Activity: Not on file  Stress: Not on file  Social Connections:  Not on file   Additional Social History:    Allergies:   Allergies  Allergen Reactions   Nsaids Other (See Comments)    GI upset- history of peptic ulcers!!   Diphenhydramine Hcl Other (See Comments)    Restlessness   Flexeril [Cyclobenzaprine] Other (See Comments)    Restlessness   Fluoxetine Other (See Comments)    Made the patient feel "worse than before" (treatment)   Hctz [Hydrochlorothiazide] Other (See Comments)    Caused to lose sodium when taking with Lisinopril   Rexulti [Brexpiprazole] Other (See Comments)    "Made me not feel right- restless"   Seroquel [Quetiapine] Other (See Comments)    Restless legs and makes the patient sweat- also does not help patient's insomnia. Pt takes this at home in 2023.   Gabapentin Diarrhea   Hydroxyzine Other (See Comments)    Restlessness    Labs:  Results for orders placed or performed during the hospital encounter of 07/01/21 (from the past 48 hour(s))  Basic metabolic panel     Status: Abnormal   Collection Time: 08/02/21  5:05 AM  Result Value Ref Range   Sodium 133 (L) 135 - 145 mmol/L   Potassium 4.4 3.5 - 5.1 mmol/L   Chloride 97 (L) 98 - 111 mmol/L   CO2 32 22 - 32 mmol/L   Glucose, Bld 93 70 - 99 mg/dL    Comment: Glucose reference range applies only to samples taken after fasting for at least 8 hours.   BUN 9 8 - 23 mg/dL   Creatinine, Ser 0.70 0.61 - 1.24 mg/dL   Calcium 8.9 8.9 - 10.3 mg/dL   GFR, Estimated >60 >60 mL/min    Comment: (NOTE) Calculated using the CKD-EPI Creatinine Equation (2021)    Anion gap 4 (L) 5 - 15    Comment: Performed at [facility], [place] [address], [place], [place] [address]      Current Facility-Administered Medications  Medication Dose Route Frequency Provider Last Rate Last Admin   0.9 %  sodium chloride infusion   Intravenous PRN Dwyane Dee, MD 10 mL/hr at 07/23/21 0600 Infusion Verify at 07/23/21 0600   acetaminophen (TYLENOL) tablet 650 mg  650 mg  Oral Q4H PRN Dwyane Dee, MD   650 mg at 08/02/21 1401   alum & mag hydroxide-simeth (MAALOX/MYLANTA) 200-200-20 MG/5ML suspension 30 mL  30 mL Oral Q4H PRN Dwyane Dee, MD   30 mL at 07/31/21 1053   ARIPiprazole (ABILIFY) tablet 2 mg  2 mg Oral QHS Suella Broad, FNP   2 mg at 08/01/21 2113  clonazePAM (KLONOPIN) tablet 0.5 mg  0.5 mg Oral Q breakfast Suella Broad, FNP   0.5 mg at 08/02/21 0740   And   clonazePAM (KLONOPIN) tablet 1 mg  1 mg Oral QHS Suella Broad, FNP   1 mg at 08/01/21 2112   enoxaparin (LOVENOX) injection 40 mg  40 mg Subcutaneous Q24H Dwyane Dee, MD   40 mg at 08/01/21 2113   feeding supplement (ENSURE ENLIVE / ENSURE PLUS) liquid 237 mL  237 mL Oral TID BM Lavina Hamman, MD   237 mL at 08/02/21 1401   hydrALAZINE (APRESOLINE) tablet 25 mg  25 mg Oral Q4H PRN Dwyane Dee, MD   25 mg at 07/13/21 1701   lisinopril (ZESTRIL) tablet 30 mg  30 mg Oral Daily Dwyane Dee, MD   30 mg at 08/02/21 1003   loratadine (CLARITIN) tablet 10 mg  10 mg Oral Daily Manuella Ghazi, Pratik D, DO   10 mg at 07/31/21 7591   MEDLINE mouth rinse  15 mL Mouth Rinse BID Dwyane Dee, MD   15 mL at 08/02/21 1004   mirtazapine (REMERON) tablet 7.5 mg  7.5 mg Oral QHS Suella Broad, FNP   7.5 mg at 08/01/21 2113   naphazoline-pheniramine (NAPHCON-A) 0.025-0.3 % ophthalmic solution 1 drop  1 drop Both Eyes QID PRN Heath Lark D, DO   1 drop at 07/19/21 1750   oxyCODONE (Oxy IR/ROXICODONE) immediate release tablet 5 mg  5 mg Oral Q12H PRN Manuella Ghazi, Pratik D, DO   5 mg at 08/02/21 1003   pantoprazole (PROTONIX) EC tablet 40 mg  40 mg Oral BID Lavina Hamman, MD   40 mg at 08/02/21 1003   phenol (CHLORASEPTIC) mouth spray 1 spray  1 spray Mouth/Throat PRN Dwyane Dee, MD   1 spray at 07/11/21 2054   polyethylene glycol (MIRALAX / GLYCOLAX) packet 17 g  17 g Oral Daily PRN Dwyane Dee, MD   17 g at 07/06/21 6384   polyvinyl alcohol (LIQUIFILM TEARS) 1.4 %  ophthalmic solution 1 drop  1 drop Both Eyes PRN Jennye Boroughs, MD   1 drop at 07/17/21 2106   potassium chloride SA (KLOR-CON M) CR tablet 20 mEq  20 mEq Oral BID Derrill Kay A, MD   20 mEq at 08/02/21 1003   propranolol (INDERAL) tablet 20 mg  20 mg Oral TID Mariel Aloe, MD   20 mg at 08/02/21 1003   saccharomyces boulardii (FLORASTOR) capsule 250 mg  250 mg Oral BID Lavina Hamman, MD   250 mg at 08/02/21 1003   simethicone (MYLICON) chewable tablet 80 mg  80 mg Oral QID Lavina Hamman, MD   80 mg at 08/02/21 1400   sodium chloride (OCEAN) 0.65 % nasal spray 1 spray  1 spray Each Nare PRN Dwyane Dee, MD   1 spray at 07/25/21 0920   sucralfate (CARAFATE) tablet 1 g  1 g Oral QID Dwyane Dee, MD   1 g at 08/02/21 1401   zolpidem (AMBIEN) tablet 5 mg  5 mg Oral Standley Brooking, MD   5 mg at 08/01/21 2113    Musculoskeletal: Strength & Muscle Tone: within normal limits Gait & Station: normal Patient leans: N/A     Psychiatric Specialty Exam:  Presentation  General Appearance: Appropriate for Environment; Casual  Eye Contact:Fair  Speech:Clear and Coherent; Normal Rate  Speech Volume:Normal  Handedness:Right   Mood and Affect  Mood:Anxious  Affect:Appropriate; Congruent   Thought Process  Thought Processes:Coherent; Linear  Descriptions of Associations:Intact  Orientation:Full (Time, Place and Person)  Thought Content:Logical  History of Schizophrenia/Schizoaffective disorder:No  Duration of Psychotic Symptoms:N/A  Hallucinations:Hallucinations: None  Ideas of Reference:None  Suicidal Thoughts:Suicidal Thoughts: No  Homicidal Thoughts:Homicidal Thoughts: No   Sensorium  Memory:Immediate Fair; Recent Fair; Remote Fair  Judgment:Fair  Insight:Poor   Executive Functions  Concentration:Fair  Attention Span:Poor  [place]   Psychomotor Activity  Psychomotor Activity:Psychomotor  Activity: Tremor; Restlessness   Assets  Assets:Communication Skills; Financial Resources/Insurance; Social Support; Physical Health   Sleep  Sleep:Sleep: Poor   Physical Exam: Physical Exam Vitals and nursing note reviewed.  Constitutional:      Appearance: Normal appearance. He is normal weight.  HENT:     Head: Normocephalic.  Eyes:     General: Lids are normal.  Neurological:     General: No focal deficit present.     Mental Status: He is alert and oriented to person, place, and time. Mental status is at baseline.     Motor: Tremor present. No atrophy.  Psychiatric:        Attention and Perception: Attention and perception normal.        Mood and Affect: Mood is anxious and depressed. Affect is tearful.        Speech: Speech normal.        Behavior: Behavior is withdrawn. Behavior is cooperative.        Thought Content: Thought content includes suicidal ideation.        Cognition and Memory: Cognition and memory normal.   Review of Systems  Neurological:  Positive for tremors, focal weakness and weakness.  Psychiatric/Behavioral:  Positive for depression, substance abuse (alcohol use, last drank 1 month ago) and suicidal ideas (suicide attempt). The patient is nervous/anxious and has insomnia (takes [place]).   All other systems reviewed and are negative. Blood pressure 127/66, pulse 76, temperature 97.9 F (36.6 C), temperature source Oral, resp. rate 16, height 5' 10"  (1.778 m), weight 66.7 kg, SpO2 100 %. Body mass index is 21.1 kg/m.   Treatment Plan Summary: Daily contact with patient to assess and evaluate symptoms and progress in treatment, Medication management, and Plan     Recommended for inpatient psychiatric treatment. Patient unable to contract for safety.  -Patient is to remain under involuntary commitment at this time, as he continues to be a danger to himself and unable to contract for safety.   -Will continue  Abilify 51m mg p.o. nightly to target  psychosis, depressive symptoms, anxiety, and suicidal ideations.  -Will continue Klonopin 0.5 mg p.o. daily every morning; and Klonopin 1 mg p.o. nightly. Will likely reduce klonopin taper next week.  -Will continue  mirtazapine 7.5 mg p.o. nightly.  If mirtazapine proven to be effective will begin to decrease his Ambien dose. -Patient was also increased on propranolol 237mp.o. 3 times daily. Insomnia-Patient continues to endorse poor sleep habits recommend documenting sleep hours byt night shift. -He is medically stable to discharge to inpatient psychiatric facility. Patient has been declined at multiple facilities [facility] Patient with chronic suicidality, multiple suicide attempts of high lethality, no safe disposition plan at this time. WIll continue to work towards disposition for this patient will likely require long term care and supervision.  -Continue to use coping skills to help with behavior modification and anxiety reducing measures.  Disposition: Recommend psychiatric Inpatient admission when medically cleared. IVC  TaGayland Curry  [place]ry, FNP 08/02/2021 2:25 PM

## 2021-08-02 NOTE — Plan of Care (Signed)
°  Problem: Pain Managment: Goal: General experience of comfort will improve Outcome: Not Progressing   Problem: Safety: Goal: Ability to remain free from injury will improve Outcome: Not Progressing   Problem: Health Behavior/Discharge Planning: Goal: Identification of resources available to assist in meeting health care needs will improve Outcome: Not Progressing

## 2021-08-02 NOTE — Progress Notes (Signed)
°   08/02/21 1200  Mobility  Activity Ambulated with assistance in hallway  Level of Assistance Standby assist, set-up cues, supervision of patient - no hands on  Assistive Device Front wheel walker  Distance Ambulated (ft) 300 ft  Activity Response Tolerated well  $Mobility charge 1 Mobility   Pt agreeable to mobilize this morning. Ambulated about 312f in hall with RW, tolerated well. 7/10 back/spinal pain noted. States this is normal for him. Left pt EOB, call bell at side. RN/NT notified of session. Sitter present.  KIowa FallsSpecialist Acute Rehab Services Office: [phone]

## 2021-08-02 NOTE — Assessment & Plan Note (Signed)
Seen on x ray, known past history.

## 2021-08-02 NOTE — Progress Notes (Signed)
Deanna called from [facility] called and stated that patient is out of their "catch men area" meaning that his insurance is in [facility] and that patient will need to go [facility].

## 2021-08-02 NOTE — TOC Progression Note (Addendum)
Transition of Care [facility]) - Progression Note    Patient Details  Name: Anthony Lamb MRN: [id number] Date of Birth: 1956/04/09  Transition of Care [facility]) CM/SW Contact  Ross Ludwig, [place] Phone Number: 08/02/2021, 10:29 AM  Clinical Narrative:      CSW contacted [facility] and confirmed patient is still on the wait list.  CSW to continue to follow patient's progress throughout discharge planning.  Patient's IVC is still valid till 08/04/2021.  Updated clinicals sent to geropsychiatry facilities.   CSW also refaxed out clinicals to the following geropsych facilities: Inpatient geropsych  Contact Phone Number   [facility]       [place]         [place]         [phone] or [place]            [phone] [place]          [phone] [place] [facility]     [phone] [employer] [facility]         [place]       [place]                 [phone] or [place]           [place]                  [phone] ext. [place]  [place] Numbers               [phone]   They can not take patient from [place] out of jurisdiction.   Expected Discharge Plan and Services  Inpatient geropsych once bed is available.      Social Determinants of Health (SDOH) Interventions    Readmission Risk Interventions No flowsheet data found.

## 2021-08-03 NOTE — TOC Progression Note (Signed)
Transition of Care (TOC) - Progression Note  ? ? ?Patient Details  ?Name: [name] ?MRN: [id number] ?Date of Birth: April 04, 1956 ? ?Transition of Care (TOC) CM/SW Contact  ?Leeroy Cha, RN ?Phone Number: ?08/03/2021, 11:46 AM ? ?Clinical Narrative:    ?[facility] at [facility] center is centered around those with intellectual disabilities. Not appropriate for center. ? ? ?  ?  ? ?Expected Discharge Plan and Services ?  ?  ?  ?  ?  ?                ?  ?  ?  ?  ?  ?  ?  ?  ?  ?  ? ? ?Social Determinants of Health (SDOH) Interventions ?  ? ?Readmission Risk Interventions ?No flowsheet data found. ? ?

## 2021-08-03 NOTE — Progress Notes (Signed)
?Progress Note ? ? ?Patient: Anthony Lamb CVE:[id number] DOB: 1956-04-13 DOA: 07/01/2021     32 ?DOS: the patient was seen and examined on 08/03/2021 ?  ?Brief hospital course: ?66 year old man presented after an intentional overdose of duloxetine. ?1/27 admitted to the hospital for suicidal drug overdose. ?1/28 started on Molnupiravir for incidental COVID-19 infection ?1/29 transferred to stepdown unit for 3% hypertonic saline for hyponatremia. ?2/2 PICC line placed for hypertonic saline ?2/7, deemed medically stable to transfer to inpatient psychiatric facility. ?2/25 PICC line removed. ?Remains medically stable as of 2/28. ? ?Assessment and Plan: ?* Overdose of antidepressant, intentional self-harm, initial encounter [facility])- (present on admission) ?SUICIDAL attempt ?--Intentional overdose on Cymbalta without significant sequela noted.   ?-- Followed by psychiatry, unable to contract for safety, continues on IVC, needs inpatient psychiatric treatment.   ?--Has a longstanding history of chronic benzodiazepine use likely secondary to alcohol use disorder, as noted above his behaviors on now extreme and is unable to cope without this medication. ?Psychiatry recommended to start patient on a benzodiazepine taper, they have notified all parties involved in prescribing the med to pt.  ?--Continue one-to-one sitter. ?--Medically stable for discharge. ? ?Tremor- (present on admission) ?--Chronic issue. Started propranolol ?-Continue propranolol, titrate as needed/tolerated ? ?Allergic reaction ?--Unsure of etiology. Occurred on 2/3. Resolved with Benadryl, Pepcid and Solu-medrol. ? ?COVID-19 virus infection- (present on admission) ?--Diagnosed on 07/01/21. Incidental.  Patient treated with molnupiravir and completed course. Isolation discontinued. ? ?Alcohol abuse- (present on admission) ?Noted. ? ?Gastroesophageal reflux disease- (present on admission) ?-Continue Protonix , carafete, simethicone, probiotics.  ?PRN Maalox  and miralax.  ? ?Ankylosing spondylitis ([place])- (present on admission) ?Seen on x ray, known past history.  ? ?SIADH (syndrome of inappropriate ADH production) ([place])- (present on admission) ?In the setting of duloxetine overdose likely in a patient with underlying mild hyponatremia from EtOH use. ?Treated with 3% saline.  ?Now normal and stable without any intervention. ? ?Major depression, recurrent ([place])- (present on admission) ?Psych following intermittently  ?Patient with chronic suicidality, multiple suicide attempts of high lethality ?-Klonopin 0.5 mg p.o. daily every morning; and Klonopin 1 mg p.o. nightly. Psychiatry will likely reduce klonopin taper next week. ?-mirtazapine 7.5 mg p.o. nightly for insomnia ?-Propranolol 49m p.o. 3 times daily for tremors and anxiety ?-Abilify 273mmg p.o. nightly for Psychotic symptoms.  ? ?He is medically stable to discharge to inpatient psychiatric facility. ?[facility] ? ? ? ? ?Subjective:  ?Feels anxious ? ?Physical Exam: ?Vitals:  ? 08/03/21 1019 08/03/21 1019 08/03/21 1356 08/03/21 1744  ?BP: 135/66 135/66 125/70 128/73  ?Pulse: 65 65 68 69  ?Resp:   (!) 22   ?Temp:   (!) 97.5 ?F (36.4 ?C)   ?TempSrc:   Oral   ?SpO2:   98% 100%  ?Weight:      ?Height:      ? ?Physical Exam ?Vitals reviewed.  ?Constitutional:   ?   General: He is not in acute distress. ?   Appearance: He is not ill-appearing or toxic-appearing.  ?Cardiovascular:  ?   Rate and Rhythm: Normal rate and regular rhythm.  ?Pulmonary:  ?   Effort: Pulmonary effort is normal. No respiratory distress.  ?   Breath sounds: No wheezing or rales.  ?Neurological:  ?   Mental Status: He is alert.  ?Psychiatric:  ?   Comments: anxious  ? ? ? ?Data Reviewed: ? ?No new labs ? ?Family Communication: none ? ?Disposition: ?Status is:  Inpatient ?Remains inpatient appropriate because: IVC, needs inpatient psychiatry, cannot contract for safety ? ? Planned Discharge Destination:  [place] ? ? ? ? ?Time  spent: 20 minutes ? ?Author: ?[name], MD ?08/03/2021 6:37 PM ? ?For on call review [email or web address].  ? ?

## 2021-08-03 NOTE — Progress Notes (Signed)
?   08/03/21 1100  ?Mobility  ?Activity Ambulated with assistance in hallway  ?Level of Assistance Modified independent, requires aide device or extra time  ?Assistive Device Front wheel walker  ?Distance Ambulated (ft) 300 ft  ?Activity Response Tolerated well  ?$Mobility charge 1 Mobility  ? ?Pt agreeable to mobilize this morning. Ambulated about 356f in hall with RW, tolerated well. Noted 8/10 neck/spine pain. Left pt in bed, call bell at side. RN/NT notified of session.  ? ?KRudell Cobb ?Mobility Specialist ?Acute Rehab Services ?Office: [phone]? ? ?

## 2021-08-04 NOTE — TOC Progression Note (Addendum)
Transition of Care (TOC) - Progression Note  ? ? ?Patient Details  ?Name: Anthony Lamb ?MRN: [id number] ?Date of Birth: 1956/04/13 ? ?Transition of Care (TOC) CM/SW Contact  ?Trish Mage, LCSW ?Phone Number: ?08/04/2021, 1:20 PM ? ?Clinical Narrative:   IVC paperwork updated again today.  Due again on 3/8. Called [facility] and confirmed that patient is still on wait list for geriatric bed. Attempted to call [employer], CST that has been providing support to Anthony Lamb.  Number does not appear to be working.  Will try sending email. TOC will continue to follow during the course of hospitalization. ? ? ?Addendum: Anthony Lamb gave me number for worker [phone].  Called, left message. ? ? ? ? ?  ?  ? ?Expected Discharge Plan and Services ?  ?  ?  ?  ?  ?                ?  ?  ?  ?  ?  ?  ?  ?  ?  ?  ? ? ?Social Determinants of Health (SDOH) Interventions ?  ? ?Readmission Risk Interventions ?No flowsheet data found. ? ?

## 2021-08-04 NOTE — Progress Notes (Signed)
?Progress Note ? ? ?Patient: Anthony Lamb [id number] DOB: [birth year] DOA: 07/01/2021     33 ?DOS: the patient was seen and examined on 08/04/2021 ?  ?Brief hospital course: ?66 year old man presented after an intentional overdose of duloxetine. ?1/27 admitted to the hospital for suicidal drug overdose. ?1/28 started on Molnupiravir for incidental COVID-19 infection ?1/29 transferred to stepdown unit for 3% hypertonic saline for hyponatremia. ?2/2 PICC line placed for hypertonic saline ?2/7 Deemed medically stable to transfer to inpatient psychiatric facility. ?2/25 PICC line removed. ?Remains medically stable as of 2/28. ? ?Assessment and Plan: ?* Overdose of antidepressant, intentional self-harm, initial encounter ([place]) ?--SUICIDAL attempt ?--Intentional overdose on Cymbalta without significant sequela noted.   ?-- Followed by psychiatry, unable to contract for safety, continues on IVC, needs inpatient psychiatric treatment.   ?--Has a longstanding history of chronic benzodiazepine use likely secondary to alcohol use disorder, as noted above his behaviors on now extreme and is unable to cope without this medication. ?Psychiatry recommended to start patient on a benzodiazepine taper, they have notified all parties involved in prescribing the med to pt.  ?--Continue one-to-one sitter. ?--Medically stable for discharge. ? 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 ?Major depression, recurrent ([place]) ?Psych following intermittently  ?Patient with chronic suicidality, multiple suicide attempts of high lethality ?-Klonopin 0.5 mg p.o. daily every morning; and Klonopin 1 mg p.o. nightly. Psychiatry will likely reduce klonopin taper next week. ?-mirtazapine 7.5 mg p.o. nightly for insomnia ?-Propranolol 77m p.o. 3 times daily for tremors and anxiety ?-Abilify 230mmg p.o. nightly for Psychotic symptoms.  ? ?He is  medically stable to discharge to inpatient psychiatric facility. ?[facility] ? ?Gastroesophageal reflux disease ?-Continue protonix , carafete, simethicone, probiotics.  ?PRN Maalox and miralax.  ? ?Tremor ?Chronic issue. Started propranolol ?-Continue propranolol, titrate as needed/tolerated ? ?Alcohol abuse ?Noted. ? ?Ankylosing spondylitis ([place]?Seen on x ray, known past history.  ? ?Essential hypertension ?-Continue lisinopril and propranolol  ? ?SIADH (syndrome of inappropriate ADH production) (H[facility])-resolved as of 08/04/2021 ?In the setting of duloxetine overdose likely in a patient with underlying mild hyponatremia from EtOH use. ?Treated with 3% saline.  ?Now normal and stable without any intervention. ? ?Chest pain-resolved as of 08/04/2021 ?Not typical. ?Multiple EKG normal. ?Likely GI in nature, Also likely heavily contributed to by anxiety. ?Possibly musculoskeletal since there is an aspect of reproducibility. ?Continue to monitor.  ? ?Allergic reaction-resolved as of 08/04/2021 ?Unsure of etiology. Occurred on 2/3. Resolved with Benadryl, Pepcid and Solu-medrol. ? ?COVID-19 virus infection-resolved as of 08/04/2021 ?Diagnosed on 07/01/21. Incidental.  ?Patient treated with molnupiravir and completed course.  ?Isolation discontinued. ? ? ? ? ?Subjective:  ?Feels ok today, has some body pain, requesting acetaminophen.  ? ?Physical Exam: ?Vitals:  ? 08/03/21 1744 08/03/21 1900 08/04/21 0624823/02/23 085003?BP: 128/73 119/68 114/71 125/77  ?Pulse: 69 67 65 75  ?Resp:  18 18   ?Temp:  97.8 ?F (36.6 ?C) 97.8 ?F (36.6 ?C)   ?TempSrc:  Oral    ?SpO2: 100% 98% 97%   ?Weight:      ?Height:      ? ?Physical Exam ?Vitals reviewed.  ?Constitutional:   ?   General: He is not in acute distress. ?   Appearance: He is not ill-appearing or toxic-appearing.  ?Neurological:  ?   Mental Status: He is alert.  ?Psychiatric:     ?  Behavior: Behavior normal.  ?   Comments: Less anxious today  ? ? ? ?Data  Reviewed: ? ?There are no new results to review at this time. ? ?Family Communication: none ? ?Disposition: ?Status is: Inpatient ?Remains inpatient appropriate because: IVC, needs inpatient psychiatric stabilization ? ? ? ? ? ? ? ? ? Planned Discharge Destination:  inpatient psychiatric facilty ? ? ? ? ?Time spent: 15 minutes ? ?Author: ?[name], MD ?08/04/2021 5:52 PM ? ?For on call review [email or web address]  ? ?

## 2021-08-04 NOTE — Progress Notes (Signed)
?   08/04/21 1200  ?Mobility  ?Activity Ambulated with assistance in hallway  ?Level of Assistance Modified independent, requires aide device or extra time  ?Assistive Device Front wheel walker  ?Distance Ambulated (ft) 300 ft  ?Activity Response Tolerated well  ?$Mobility charge 1 Mobility  ? ?Pt agreeable to mobilize this morning. Ambulated about 375f in hall with RW, tolerated well. Noted 7/10 spinal pain, as well as stating "it feels like my vertebrae are rubbing together". Left pt in bed, call bell at side. RN/NT notified of session. Sitter present. ? ?KRudell Cobb ?Mobility Specialist ?Acute Rehab Services ?Office: [phone]? ? ?

## 2021-08-04 NOTE — Assessment & Plan Note (Addendum)
See above

## 2021-08-05 DIAGNOSIS — T50902A Poisoning by unspecified drugs, medicaments and biological substances, intentional self-harm, initial encounter: Secondary | ICD-10-CM | POA: Diagnosis not present

## 2021-08-05 DIAGNOSIS — T43202A Poisoning by unspecified antidepressants, intentional self-harm, initial encounter: Secondary | ICD-10-CM | POA: Diagnosis not present

## 2021-08-05 DIAGNOSIS — T43201A Poisoning by unspecified antidepressants, accidental (unintentional), initial encounter: Secondary | ICD-10-CM

## 2021-08-05 NOTE — Progress Notes (Signed)
?Progress Note ? ? ?Patient: Anthony Lamb YQM:[id number] DOB: 08/29/1955 DOA: 07/01/2021     34 ?DOS: the patient was seen and examined on 08/05/2021 ?  ?Brief hospital course: ?66 year old man presented after an intentional overdose of duloxetine. ?1/27 admitted to the hospital for suicidal drug overdose. ?1/28 started on Molnupiravir for incidental COVID-19 infection ?1/29 transferred to stepdown unit for 3% hypertonic saline for hyponatremia. ?2/2 PICC line placed for hypertonic saline ?2/7 Deemed medically stable to transfer to inpatient psychiatric facility. ?2/25 PICC line removed. ?Remains medically stable as of 2/28. ? ?Assessment and Plan: ?* Overdose of antidepressant, intentional self-harm, initial encounter [facility]) ?--s/p suicide attempt with intentional overdose on Cymbalta without significant sequela   ?-- Followed by psychiatry, unable to contract for safety, continues on IVC, needs inpatient psychiatric treatment.   ?--Has a longstanding history of chronic benzodiazepine use likely secondary to alcohol use disorder ?--Psychiatry recommended to start patient on a benzodiazepine taper, they have notified all parties involved in prescribing the med to pt.  ?--Continue one-to-one sitter. ?--Medically stable for discharge. ? 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 ?Major depression, recurrent ([place]) ?--Psych following intermittentl, patient with chronic suicidality, multiple suicide attempts of high lethality ?-Klonopin 0.5 mg p.o. daily every morning; and Klonopin 1 mg p.o. nightly. Psychiatry will likely reduce klonopin taper in future ?-mirtazapine 7.5 mg p.o. nightly for insomnia ?-Propranolol 57m p.o. 3 times daily for tremors and anxiety ?-Abilify 231mmg p.o. nightly for Psychotic symptoms.  ? ?He is medically stable to discharge to inpatient psychiatric facility. ?[facility] ? ?Gastroesophageal reflux disease ?-Continue protonix , carafete, simethicone, probiotics.  ? ?Tremor ?Chronic issue. Started propranolol ?-Continue propranolol, titrate as needed/tolerated ? ?Alcohol abuse ?Noted. ? ?Ankylosing spondylitis ([place]?Seen on x ray, known past history.  ? ?Essential hypertension ?-Continue lisinopril and propranolol  ? ?SIADH (syndrome of inappropriate ADH production) ([facility])-resolved as of 08/04/2021 ?In the setting of duloxetine overdose likely in a patient with underlying mild hyponatremia from EtOH use. ?Treated with 3% saline.  ?Now normal and stable without any intervention. ? ?Chest pain-resolved as of 08/04/2021 ?Not typical. ?Multiple EKG normal. ?Likely GI in nature, Also likely heavily contributed to by anxiety. ?Possibly musculoskeletal since there is an aspect of reproducibility. ?Continue to monitor.  ? ?Allergic reaction-resolved as of 08/04/2021 ?Unsure of etiology. Occurred on 2/3. Resolved with Benadryl, Pepcid and Solu-medrol. ? ?COVID-19 virus infection-resolved as of 08/04/2021 ?Diagnosed on 07/01/21. Incidental.  ?Patient treated with molnupiravir and completed course.  ?Isolation discontinued. ? ? ? ? ? ? ? ?Subjective:  ?Feels ok ? ?Physical Exam: ?Vitals:  ? 08/04/21 2027 08/05/21 0800 08/05/21 1325 08/05/21 1556  ?BP:   115/65 119/71  ?Pulse: 65  67 71  ?Resp: 15  16 16   ?Temp: 98.5 ?F (36.9 ?C)  97.8 ?F (36.6 ?C) 97.8 ?F (36.6 ?C)  ?TempSrc: Oral  Oral Oral  ?SpO2: 98%  100% 100%  ?Weight:  66.6 kg    ?Height:      ? ?Physical Exam ?Vitals reviewed.  ?Constitutional:   ?   General: He is not in acute distress. ?   Appearance: He is not ill-appearing or toxic-appearing.  ?Neurological:  ?   Mental Status: He is alert.  ? ? ? ?Data Reviewed: ? ?There are no new results to review at this time. ? ?Family Communication: none ? ?Disposition: ?Status is: Inpatient ?Remains inpatient appropriate  because: SI, needs inpatient psychiatric care ? ? ? ? ? ? ? ? ? Planned  Discharge Destination:  [place] ? ? ? ? ?Time spent: 15 minutes ? ?Author: ?[name], MD ?08/05/2021 5:25 PM ? ?For on call review [email or web address]  ? ?

## 2021-08-05 NOTE — Consult Note (Signed)
[place] [facility] Consult   Reason for Consult:  Suicide attempt by overdose on cymbalta Referring Physician:  Dr. 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 male patient admitted with suicide attempt by overdose.  Patient presented after overdosing on (60) 60 mg tablets of Cymbalta, he subsequently developed serotonin syndrome and severe hyponatremia that resulted in admission to intensive care for hyponatremia protocol.  08/05/2021: Patient seen and assessed today by the psychiatric nurse practitioner. On todays evaluation he is observed to be sitting on the side of the bed, and reports to me that he is doing well at this time. He is able to identify some coping skills, and states "Walking seems to be my favorite." He admits that it is hard to walk and think, so he focuses his thoughts on other things. Patient describes his mood as "ok", his affect is congruent and appropriate. As with most visits he inquires about his medication changes. " Are you changing my medications yet, I cant sleep without my medications. Please dont change my medications. Lavell Islam is not going to give me my medicine when I get there. " Emphasis was again placed on negative thought blocking and managing things out of his control.  He denies suicidal ideations, homicidal ideations and or hallucinations at this time.   Patient is now  medically stable discharge to inpatient general psych hospital for crisis stabilization, medication management, and behavior modification. Patient has been declined at multiple acute inpatient facilities, suspect underlying barrier is a diagnosis of dementia in the context of alcohol withdraw and Delirium tremens. Patient has a significant history of alcohol abuse, that was treated with  long term use of benzodiazepines which has now developed into severe dependency.   HPI:  Anthony Lamb is a 66 y.o. male with a past medical history significant for hypertension, GERD, peptic ulcer disease, ankylosing spondylitis, irritable bowel syndrome, chronic pain, anxiety, and depression who presents for suicide attempt by overdose.  According to EMS and patient, at 545 this morning, patient tried to overdose by taking 60 duloxetine tablets.  EMS thought that they were 60 mg tablets by report.  Patient reports she is feeling some waxing waning nausea, lightheadedness, and feels like he has no bowel movement.  He feels very dehydrated and tired.  He also is feeling very anxious and agitated.  He still says he wants to kill himself.  He reports that he was discharged from the emergency department yesterday at [facility] and he still says he is going to try to kill himself he leaves again today.  Past Psychiatric History: Suicidal ideations, generalized anxiety, major depressive disorder recurrent with atypical features, alcohol abuse, benzodiazepine use disorder severe  Risk to Self: Yes Risk to Others: Denies Prior Inpatient Therapy: Yes most recent [facility] December 2022 Prior Outpatient Therapy: Denies currently followed by Dr. Nancy Fetter at [facility].  Past Medical History:  Past Medical History:  Diagnosis Date   Alcohol abuse, in remission  Anal fissure    Anemia    Ankylosing spondylitis (HCC)    Anxiety    Arthritis    Chronic diarrhea    Chronic headaches    Chronic pain    Colitis     Colon polyps    Depression    Esophagitis    Gastric polyps    hyperplastic and fundic gland   Gastritis    GERD (gastroesophageal reflux disease)    Hypertension    IBS (irritable bowel syndrome)    Poor dentition    SIADH (syndrome of inappropriate ADH production) ([place])     Past Surgical History:  Procedure Laterality Date   BIOPSY  11/26/2018   Procedure: BIOPSY;  Surgeon: Gatha Mayer, MD;  Location: [facility];  Service: Endoscopy;;   COLONOSCOPY W/ BIOPSIES     COLONOSCOPY WITH PROPOFOL N/A 11/26/2018   Procedure: COLONOSCOPY WITH PROPOFOL;  Surgeon: Gatha Mayer, MD;  Location: [facility];  Service: Endoscopy;  Laterality: N/A;   ESOPHAGOGASTRODUODENOSCOPY     ESOPHAGOGASTRODUODENOSCOPY (EGD) WITH PROPOFOL N/A 11/26/2018   Procedure: ESOPHAGOGASTRODUODENOSCOPY (EGD) WITH PROPOFOL;  Surgeon: Gatha Mayer, MD;  Location: [facility];  Service: Endoscopy;  Laterality: N/A;   HEMOSTASIS CLIP PLACEMENT  11/26/2018   Procedure: HEMOSTASIS CLIP PLACEMENT;  Surgeon: Gatha Mayer, MD;  Location: [facility];  Service: Endoscopy;;   HERNIA REPAIR Bilateral    HOT HEMOSTASIS N/A 11/26/2018   Procedure: HOT HEMOSTASIS (ARGON PLASMA COAGULATION/BICAP);  Surgeon: Gatha Mayer, MD;  [facility];  Service: Endoscopy;  Laterality: N/A;   OPEN REDUCTION INTERNAL FIXATION (ORIF) DISTAL RADIAL FRACTURE Right 02/11/2018   Procedure: OPEN REDUCTION INTERNAL FIXATION (ORIF) DISTAL RADIAL FRACTURE;  Surgeon: Milly Jakob, MD;  Location: [place];  Service: Orthopedics;  Laterality: Right;   POLYPECTOMY  11/26/2018   Procedure: POLYPECTOMY;  Surgeon: Gatha Mayer, MD;  Location: [facility];  Service: Endoscopy;;   TONSILLECTOMY     Family History:  Family History  Problem Relation Age of Onset   Anxiety disorder Mother    Congestive Heart Failure Mother    Crohn's disease Mother    Colon cancer Mother 37   Arthritis Father    High blood pressure Father    Crohn's  disease Father    Family Psychiatric  History: Denies Social History:  Social History   Substance and Sexual Activity  Alcohol Use Not Currently     Social History   Substance and Sexual Activity  Drug Use Not Currently   Types: Marijuana    Social History   Socioeconomic History   Marital status: Single    Spouse name: Not on file   Number of children: 0   Years of education: Not on file   Highest education level: Not on file  Occupational History   Not on file  Tobacco Use   Smoking status: Former    Types: Cigarettes    Quit date: 2014    Years since quitting: 9.1   Smokeless tobacco: Never  Vaping Use   Vaping Use: Never used  Substance and Sexual Activity   Alcohol use: Not Currently   Drug use: Not Currently    Types: Marijuana   Sexual activity: Not Currently  Other Topics Concern   Not on file  Social History Narrative   HSG. Long - term disability - unable to work. Lived with his mother in her house - she died 2023/05/14 -    Lives in apartment   Has  case worker   Social Determinants of Radio broadcast assistant Strain: Not on file  Food Insecurity: Not on file  Transportation Needs: Not on file  Physical Activity: Not on file  Stress: Not on file  Social Connections: Not on file   Additional Social History:    Allergies:   Allergies  Allergen Reactions   Nsaids Other (See Comments)    GI upset- history of peptic ulcers!!   Diphenhydramine Hcl Other (See Comments)    Restlessness   Flexeril [Cyclobenzaprine] Other (See Comments)    Restlessness   Fluoxetine Other (See Comments)    Made the patient feel "worse than before" (treatment)   Hctz [Hydrochlorothiazide] Other (See Comments)    Caused to lose sodium when taking with Lisinopril   Rexulti [Brexpiprazole] Other (See Comments)    "Made me not feel right- restless"   Seroquel [Quetiapine] Other (See Comments)    Restless legs and makes the patient sweat- also does not help patient's  insomnia. Pt takes this at home in 2023.   Gabapentin Diarrhea   Hydroxyzine Other (See Comments)    Restlessness    Labs:  No results found for this or any previous visit (from the past 48 hour(s)).     Current Facility-Administered Medications  Medication Dose Route Frequency Provider Last Rate Last Admin   0.9 %  sodium chloride infusion   Intravenous PRN Dwyane Dee, MD 10 mL/hr at 07/23/21 0600 Infusion Verify at 07/23/21 0600   acetaminophen (TYLENOL) tablet 650 mg  650 mg Oral Q4H PRN Dwyane Dee, MD   650 mg at 08/05/21 0648   alum & mag hydroxide-simeth (MAALOX/MYLANTA) 200-200-20 MG/5ML suspension 30 mL  30 mL Oral Q4H PRN Dwyane Dee, MD   30 mL at 07/31/21 1053   ARIPiprazole (ABILIFY) tablet 2 mg  2 mg Oral QHS Suella Broad, FNP   2 mg at 08/04/21 2114   clonazePAM (KLONOPIN) tablet 0.5 mg  0.5 mg Oral Q breakfast Suella Broad, FNP   0.5 mg at 08/05/21 3474   And   clonazePAM (KLONOPIN) tablet 1 mg  1 mg Oral QHS Suella Broad, FNP   1 mg at 08/04/21 2113   enoxaparin (LOVENOX) injection 40 mg  40 mg Subcutaneous Q24H Dwyane Dee, MD   40 mg at 08/04/21 2112   feeding supplement (ENSURE ENLIVE / ENSURE PLUS) liquid 237 mL  237 mL Oral TID BM Lavina Hamman, MD   237 mL at 08/05/21 1047   hydrALAZINE (APRESOLINE) tablet 25 mg  25 mg Oral Q4H PRN Dwyane Dee, MD   25 mg at 07/13/21 1701   lisinopril (ZESTRIL) tablet 30 mg  30 mg Oral Daily Dwyane Dee, MD   30 mg at 08/05/21 1047   loratadine (CLARITIN) tablet 10 mg  10 mg Oral Daily Manuella Ghazi, Pratik D, DO   10 mg at 08/05/21 1048   MEDLINE mouth rinse  15 mL Mouth Rinse BID Dwyane Dee, MD   15 mL at 08/05/21 1048   mirtazapine (REMERON) tablet 7.5 mg  7.5 mg Oral QHS Suella Broad, FNP   7.5 mg at 08/04/21 2113   naphazoline-pheniramine (NAPHCON-A) 0.025-0.3 % ophthalmic solution 1 drop  1 drop Both Eyes QID PRN Heath Lark D, DO   1 drop at 07/19/21 1750   oxyCODONE  (Oxy IR/ROXICODONE) immediate release tablet 5 mg  5 mg Oral Q12H PRN Manuella Ghazi, Pratik D, DO   5 mg at 08/05/21 1048   pantoprazole (  PROTONIX) EC tablet 40 mg  40 mg Oral BID Lavina Hamman, MD   40 mg at 08/05/21 1047   phenol (CHLORASEPTIC) mouth spray 1 spray  1 spray Mouth/Throat PRN Dwyane Dee, MD   1 spray at 07/11/21 2054   polyethylene glycol (MIRALAX / GLYCOLAX) packet 17 g  17 g Oral Daily PRN Dwyane Dee, MD   17 g at 07/06/21 3532   polyvinyl alcohol (LIQUIFILM TEARS) 1.4 % ophthalmic solution 1 drop  1 drop Both Eyes PRN Jennye Boroughs, MD   1 drop at 07/17/21 2106   potassium chloride SA (KLOR-CON M) CR tablet 20 mEq  20 mEq Oral BID Derrill Kay A, MD   20 mEq at 08/05/21 1048   propranolol (INDERAL) tablet 20 mg  20 mg Oral TID Mariel Aloe, MD   20 mg at 08/05/21 1048   saccharomyces boulardii (FLORASTOR) capsule 250 mg  250 mg Oral BID Lavina Hamman, MD   250 mg at 08/05/21 1047   simethicone (MYLICON) chewable tablet 80 mg  80 mg Oral QID Lavina Hamman, MD   80 mg at 08/05/21 1048   sodium chloride (OCEAN) 0.65 % nasal spray 1 spray  1 spray Each Nare PRN Dwyane Dee, MD   1 spray at 07/25/21 0920   sucralfate (CARAFATE) tablet 1 g  1 g Oral QID Dwyane Dee, MD   1 g at 08/05/21 1047   zolpidem (AMBIEN) tablet 5 mg  5 mg Oral Standley Brooking, MD   5 mg at 08/04/21 2114    Musculoskeletal: Strength & Muscle Tone: within normal limits Gait & Station: normal Patient leans: N/A     Psychiatric Specialty Exam:  Presentation  General Appearance: Appropriate for Environment; Casual  Eye Contact:Fair  Speech:Clear and Coherent; Normal Rate  Speech Volume:Normal  Handedness:Right   Mood and Affect  Mood:Anxious  Affect:Appropriate; Congruent   Thought Process  Thought Processes:Coherent; Linear  Descriptions of Associations:Intact  Orientation:Full (Time, Place and Person)  Thought Content:Logical  History of  Schizophrenia/Schizoaffective disorder:No  Duration of Psychotic Symptoms:N/A  Hallucinations:Hallucinations: None  Ideas of Reference:None  Suicidal Thoughts:Suicidal Thoughts: Yes, Passive  Homicidal Thoughts:Homicidal Thoughts: No   Sensorium  Memory:Immediate Good; Recent Good; Remote Good  Judgment:Poor  Insight:Poor   Executive Functions  Concentration:Fair  Attention Span:Fair  Recall:Poor  Fund of Knowledge:Poor  Language:Poor   Psychomotor Activity  Psychomotor Activity:Psychomotor Activity: Tremor; Restlessness   Assets  Assets:Communication Skills; Financial Resources/Insurance; Physical Health; Resilience; Social Support   Sleep  Sleep:Sleep: Fair   Physical Exam: Physical Exam Vitals and nursing note reviewed.  Constitutional:      Appearance: Normal appearance. He is normal weight.  HENT:     Head: Normocephalic.  Eyes:     General: Lids are normal.  Neurological:     General: No focal deficit present.     Mental Status: He is alert and oriented to person, place, and time. Mental status is at baseline.     Motor: Tremor present. No atrophy.  Psychiatric:        Attention and Perception: Attention and perception normal.        Mood and Affect: Mood is anxious and depressed. Affect is tearful.        Speech: Speech normal.        Behavior: Behavior is withdrawn. Behavior is cooperative.        Thought Content: Thought content includes suicidal ideation.        Cognition and  Memory: Cognition and memory normal.   Review of Systems  Neurological:  Positive for tremors, focal weakness and weakness.  Psychiatric/Behavioral:  Positive for depression, substance abuse (alcohol use, last drank 1 month ago) and suicidal ideas (suicide attempt). The patient is nervous/anxious and has insomnia (takes [place]).   All other systems reviewed and are negative. Blood pressure 125/77, pulse 65, temperature 98.5 F (36.9 C), temperature source Oral,  resp. rate 15, height 5' 10"  (1.778 m), weight 66.6 kg, SpO2 98 %. Body mass index is 21.07 kg/m.   Treatment Plan Summary: Daily contact with patient to assess and evaluate symptoms and progress in treatment, Medication management, and Plan     Recommended for inpatient psychiatric treatment. Patient unable to contract for safety.   -Patient is to remain under involuntary commitment at this time, as he continues to be a danger to himself and unable to contract for safety.    -Will continue  Abilify 61m mg p.o. nightly to target psychosis, depressive symptoms, anxiety, and suicidal ideations.   -Will continue Klonopin 0.5 mg p.o. daily every morning; and Klonopin 1 mg p.o. nightly. Will likely reduce klonopin taper next week.   -Will continue  mirtazapine 7.5 mg p.o. nightly.  If mirtazapine proven to be effective will begin to decrease his Ambien dose.  -Patient was also increased on propranolol 283mp.o. 3 times daily.  Insomnia-Patient continues to endorse poor sleep habits recommend documenting sleep hours byt night shift.  -He is medically stable to discharge to inpatient psychiatric facility. Patient has been declined at multiple facilities [facility] Patient with chronic suicidality, multiple suicide attempts of high lethality, no safe disposition plan at this time. WIll continue to work towards disposition for this patient will likely require long term care and supervision.   -Continue to use coping skills to help with behavior modification and anxiety reducing measures.  Disposition: Recommend psychiatric Inpatient admission when medically cleared. IVC  TaSuella BroadFNP 08/05/2021 11:15 AM

## 2021-08-06 DIAGNOSIS — E222 Syndrome of inappropriate secretion of antidiuretic hormone: Secondary | ICD-10-CM

## 2021-08-06 NOTE — Progress Notes (Signed)
?Progress Note ? ? ?Patient: Anthony Lamb JAS:[id number] DOB: [birth year] DOA: 07/01/2021     35 ?DOS: the patient was seen and examined on 08/06/2021 ?  ?Brief hospital course: ?66 year old man presented after an intentional overdose of duloxetine. ?1/27 admitted to the hospital for suicidal drug overdose. ?1/28 started on Molnupiravir for incidental COVID-19 infection ?1/29 transferred to stepdown unit for 3% hypertonic saline for hyponatremia. ?2/2 PICC line placed for hypertonic saline ?2/7 Deemed medically stable to transfer to inpatient psychiatric facility. ?2/25 PICC line removed. ?Remains medically stable as of 2/28. ? ?Assessment and Plan: ?* Overdose of antidepressant, intentional self-harm, initial encounter [facility]) ?--s/p suicide attempt with intentional overdose on Cymbalta without significant sequela   ?-- Followed by psychiatry, unable to contract for safety, continues on IVC, needs inpatient psychiatric treatment.   ?--Has a longstanding history of chronic benzodiazepine use likely secondary to alcohol use disorder ?--Psychiatry recommended to start patient on a benzodiazepine taper, they have notified all parties involved in prescribing the med to pt.  ?--Continue one-to-one sitter. ?--Medically stable for discharge. ? 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 ?Major depression, recurrent ([place]) ?--Psychiatry following intermittently, patient with chronic suicidality, multiple suicide attempts of high lethality ?--Klonopin 0.5 mg p.o. daily every morning; and Klonopin 1 mg p.o. nightly. Psychiatry will likely reduce klonopin taper in future ?--mirtazapine 7.5 mg p.o. nightly for insomnia ?--Propranolol 36m p.o. 3 times daily for tremors and anxiety ?--Abilify 233mmg p.o. nightly for Psychotic symptoms.  ?--He is medically stable to discharge to inpatient psychiatric facility. [facility]  ? ?Gastroesophageal reflux disease ?-Continue protonix , carafete, simethicone, probiotics.  ? ?Tremor ?Chronic issue. Started propranolol ?-Continue propranolol, titrate as needed/tolerated ? ?Alcohol abuse ?Noted. ? ?Ankyl[place] spondylitis ([facility]?Seen on x ray, known past history.  ? ?Essential hypertension ?-Continue lisinopril and propranolol  ? ?SIADH (syndrome of inappropriate ADH production) (HCC)-resolved as of 08/04/2021 ?--In the setting of duloxetine overdose likely in a patient with underlying mild hyponatremia, chronic. Treated with 3% saline.  ?--stable ? ?Chest pain-resolved as of 08/04/2021 ?Not typical. Multiple EKG normal. No further evaluation. ? ?Allergic reaction-resolved as of 08/04/2021 ?--unclear etiology. Occurred on 2/3. Resolved with Benadryl, Pepcid and Solu-medrol. ? ?COVID-19 virus infection-resolved as of 08/04/2021 ?--Diagnosed on 07/01/21. Incidental. Treated with molnupiravir. Isolation discontinued. ? ? ? ? ? ? ? ?Subjective:  ?Feels ok ? ?Physical Exam: ?Vitals:  ? 08/05/21 2302 08/06/21 0702 08/06/21 0800 08/06/21 1400  ?BP: 125/77 129/67  128/69  ?Pulse: 84 70  61  ?Resp: 19 17  17   ?Temp: 97.9 ?F (36.6 ?C) 98.2 ?F (36.8 ?C)  97.9 ?F (36.6 ?C)  ?TempSrc: Oral Oral  Oral  ?SpO2: 99% 97%  100%  ?Weight:   66.5 kg   ?Height:      ? ?Physical Exam ?Constitutional:   ?   General: He is not in acute distress. ?   Appearance: He is not ill-appearing or toxic-appearing.  ?Cardiovascular:  ?   Rate and Rhythm: Normal rate and regular rhythm.  ?   Heart sounds: No murmur heard. ?Pulmonary:  ?   Effort: Pulmonary effort is normal. No respiratory distress.  ?   Breath sounds: No wheezing, rhonchi or rales.  ?Neurological:  ?   Mental Status: He is alert.  ?Psychiatric:  ?   Comments: Anxious   ? ? ? ?Data Reviewed: ? ?There are no new results to review  at this time. ? ?Family Communication: none ? ?Disposition: ?Status is: Inpatient ?Remains inpatient appropriate because: needs inpatient  psychiatric treatment, cannot contract for safety ? ? ? ? ? ? ? ? ? Planned Discharge Destination:  [place] ? ? ? ? ?Time spent: 15 minutes ? ?Author: ?[name], MD ?08/06/2021 3:35 PM ? ?For on call review [email or web address].  ? ?

## 2021-08-07 NOTE — TOC Transition Note (Signed)
Transition of Care (TOC) - CM/SW Discharge Note ? ? ?Patient Details  ?Name: Anthony Lamb ?MRN: [id number] ?Date of Birth: [birth year] ? ?Transition of Care (TOC) CM/SW Contact:  ?Nikkol Pai, Marta Lamas, LCSW ?Phone Number: ?08/07/2021, 9:52 AM ? ? ?Clinical Narrative:    ? ?Per attending physician, patient is medically stable and ready for discharge; however, there are still no beds available at [facility] called [facility] (# [phone]) to try and obtain status of patient's name on waiting list, but was told that this information can not be disclosed.  LCSW was able to confirm patient's name on the waiting list.  LCSW was instructed to call back daily to check bed availability. ?IVC paperwork renewed and not due again until 08/10/2021. ? ?Patient Goals and CMS Choice ? ?[facility] for ongoing mental health and substance abuse issues. ? ?Discharge Placement ? ?[facility] - when bed available (on waiting list). ? ?Discharge Plan and Services ? ?Awaiting bed availability at [facility]. ? ?Social Determinants of Health (SDOH) Interventions ? ? ? ?Readmission Risk Interventions ?No flowsheet data found. ? ? ? ? ?

## 2021-08-07 NOTE — Progress Notes (Signed)
?   08/07/21 1100  ?Mobility  ?Activity Ambulated with assistance in hallway  ?Level of Assistance Standby assist, set-up cues, supervision of patient - no hands on  ?Assistive Device Front wheel walker  ?Distance Ambulated (ft) 300 ft  ?Activity Response Tolerated well  ?$Mobility charge 1 Mobility  ? ?Pt agreeable to mobilize this morning. Ambulated about 363f in hall with RW, tolerated well. No complaints. Left pt in bed, call bell at side. RN/NT notified of session.  ? ?KRudell Cobb ?Mobility Specialist ?Acute Rehab Services ?Office: [phone]? ? ?

## 2021-08-07 NOTE — Progress Notes (Signed)
?Progress Note ? ? ?Patient: Anthony Lamb OBS:[id number] DOB: [birth year] DOA: 07/01/2021     36 ?DOS: the patient was seen and examined on 08/07/2021 ?  ?Brief hospital course: ?66 year old man presented after an intentional overdose of duloxetine. ?1/27 admitted to the hospital for suicidal drug overdose. ?1/28 started on Molnupiravir for incidental COVID-19 infection ?1/29 transferred to stepdown unit for 3% hypertonic saline for hyponatremia. ?2/2 PICC line placed for hypertonic saline ?2/7 Deemed medically stable to transfer to inpatient psychiatric facility. ?2/25 PICC line removed. ?Remains medically stable as of 2/28. ? ?Assessment and Plan: ?* Overdose of antidepressant, intentional self-harm, initial encounter [facility]) ?--s/p suicide attempt with intentional overdose on Cymbalta without significant sequela   ?-- Followed by psychiatry, unable to contract for safety, continues on IVC, needs inpatient psychiatric treatment.   ?--Has a longstanding history of chronic benzodiazepine use likely secondary to alcohol use disorder ?--Psychiatry recommended to start patient on a benzodiazepine taper, they have notified all parties involved in prescribing the med to pt.  ?--Continue one-to-one sitter. ?--Medically stable for discharge. ? 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 ?Major depression, recurrent ([place]) ?--Psychiatry following intermittently, patient with chronic suicidality, multiple suicide attempts of high lethality ?--Klonopin 0.5 mg p.o. daily every morning; and Klonopin 1 mg p.o. nightly. Psychiatry will likely reduce klonopin taper in future ?--mirtazapine 7.5 mg p.o. nightly for insomnia ?--Propranolol 31m p.o. 3 times daily for tremors and anxiety ?--Abilify 259mmg p.o. nightly for Psychotic symptoms.  ?--He is medically stable to discharge to inpatient psychiatric facility. [facility]  ? ?Gastroesophageal reflux disease ?-Continue protonix , carafete, simethicone, probiotics.  ? ?Tremor ?Chronic issue. Started propranolol ?-Continue propranolol, titrate as needed/tolerated ? ?Alcohol abuse ?Noted. ? ?Ankylosing spondylitis ([facility]?Seen on x ray, known past history.  ? ?Essential hypertension ?-Continue lisinopril and propranolol  ? ?SIADH (syndrome of inappropriate ADH production) ([facility])-resolved as of 08/04/2021 ?--In the setting of duloxetine overdose likely in a patient with underlying mild hyponatremia, chronic. Treated with 3% saline.  ?--stable ? ?Chest pain-resolved as of 08/04/2021 ?Not typical. Multiple EKG normal. No further evaluation. ? ?Allergic reaction-resolved as of 08/04/2021 ?--unclear etiology. Occurred on 2/3. Resolved with Benadryl, Pepcid and Solu-medrol. ? ?COVID-19 virus infection-resolved as of 08/04/2021 ?--Diagnosed on 07/01/21. Incidental. Treated with molnupiravir. Isolation discontinued. ? ? ? ? ? ? ? ?Subjective:  ?Feels ok ? ?Physical Exam: ?Vitals:  ? 08/06/21 0702 08/06/21 0800 08/06/21 1400 08/07/21 1327  ?BP: 129/67  128/69 (!) 141/80  ?Pulse: 70  61 65  ?Resp: 17  17 18   ?Temp: 98.2 ?F (36.8 ?C)  97.9 ?F (36.6 ?C) 97.7 ?F (36.5 ?C)  ?TempSrc: Oral  Oral Oral  ?SpO2: 97%  100% 100%  ?Weight:  66.5 kg    ?Height:      ? ?Physical Exam ?Vitals reviewed.  ?Constitutional:   ?   General: He is not in acute distress. ?   Appearance: He is not ill-appearing or toxic-appearing.  ?Musculoskeletal:  ?   Comments: Observed in hallway walking with walker with supervision  ?Neurological:  ?   Mental Status: He is alert.  ?Psychiatric:  ?   Comments: Mildly anxious  ? ? ? ?Data Reviewed: ? ?There are no new results to review at this time. ? ?Family Communication: none ? ?Disposition: ?Status is: Inpatient ?Remains inpatient appropriate because: needs inpatient psychiatric hospitalization ? ? ? ? ? ? ? ? ?  Planned Discharge Destination:  [facility] ? ? ? ? ?Time spent:  15 minutes ? ?Author: ?[name], MD ?08/07/2021 2:55 PM ? ?For on call review [email or web address].  ? ?

## 2021-08-08 DIAGNOSIS — T43202A Poisoning by unspecified antidepressants, intentional self-harm, initial encounter: Secondary | ICD-10-CM

## 2021-08-08 DIAGNOSIS — T50902A Poisoning by unspecified drugs, medicaments and biological substances, intentional self-harm, initial encounter: Secondary | ICD-10-CM | POA: Diagnosis not present

## 2021-08-08 NOTE — Progress Notes (Signed)
?Progress Note ? ? ?Patient: Anthony Lamb MWU:[id number] DOB: Jan 02, 1956 DOA: 07/01/2021     37 ?DOS: the patient was seen and examined on 08/08/2021 ?  ?Brief hospital course: ?66 year old man presented after an intentional overdose of duloxetine. ?1/27 admitted to the hospital for suicidal drug overdose. ?1/28 started on Molnupiravir for incidental COVID-19 infection ?1/29 transferred to stepdown unit for 3% hypertonic saline for hyponatremia. ?2/2 PICC line placed for hypertonic saline ?2/7 Deemed medically stable to transfer to inpatient psychiatric facility. ?2/25 PICC line removed. ?Remains medically stable as of 2/28. ? ?Assessment and Plan: ?* Overdose of antidepressant, intentional self-harm, initial encounter [facility] ?--s/p suicide attempt with intentional overdose on Cymbalta without significant sequela   ?-- Followed by psychiatry, unable to contract for safety, continues on IVC, needs inpatient psychiatric treatment.   ?--Has a longstanding history of chronic benzodiazepine use likely secondary to alcohol use disorder ?--Psychiatry recommended to start patient on a benzodiazepine taper, they have notified all parties involved in prescribing the med to pt.  ?--Continue one-to-one sitter. ?--Medically stable for discharge. ? 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 ?Major depression, recurrent ([place]) ?--Psychiatry following intermittently, patient with chronic suicidality, multiple suicide attempts of high lethality ?--Klonopin 0.5 mg p.o. daily every morning; and Klonopin 1 mg p.o. nightly. Psychiatry will likely reduce klonopin taper in future ?--mirtazapine 7.5 mg p.o. nightly for insomnia ?--Propranolol 49m p.o. 3 times daily for tremors and anxiety ?--Abilify 253mmg p.o. nightly for Psychotic symptoms.  ?--He is medically stable to discharge to inpatient psychiatric facility. [facility]  ? ?Gastroesophageal reflux disease ?-Continue protonix , carafete, simethicone, probiotics.  ? ?Tremor ?Chronic issue. Started propranolol ?-Continue propranolol, titrate as needed/tolerated ? ?Alcohol abuse ?Noted. ? ?Ankylosing spondylitis ([facility]?Seen on x ray, known past history.  ? ?Essential hypertension ?-Continue lisinopril and propranolol  ? ?SIADH (syndrome of inappropriate ADH production) (HCC)-resolved as of 08/04/2021 ?--In the setting of duloxetine overdose likely in a patient with underlying mild hyponatremia, chronic. Treated with 3% saline.  ?--stable ? ?Chest pain-resolved as of 08/04/2021 ?Not typical. Multiple EKG normal. No further evaluation. ? ?Allergic reaction-resolved as of 08/04/2021 ?--unclear etiology. Occurred on 2/3. Resolved with Benadryl, Pepcid and Solu-medrol. ? ?COVID-19 virus infection-resolved as of 08/04/2021 ?--Diagnosed on 07/01/21. Incidental. Treated with molnupiravir. Isolation discontinued. ? ? ? ? ?  ? ?Subjective:  ?Some diarrhea today ? ?Physical Exam: ?Vitals:  ? 08/07/21 1817 08/07/21 2041 08/08/21 0629 08/08/21 1315  ?BP:  111/65 130/73 132/73  ?Pulse:  60 65 69  ?Resp:  18 15 18   ?Temp:  98 ?F (36.7 ?C) 97.9 ?F (36.6 ?C) 98.4 ?F (36.9 ?C)  ?TempSrc:  Oral Oral Oral  ?SpO2:  100% 100% 100%  ?Weight: 65 kg     ?Height:      ? ?Physical Exam ?Vitals reviewed.  ?Constitutional:   ?   General: He is not in acute distress. ?   Appearance: He is not ill-appearing or toxic-appearing.  ?Pulmonary:  ?   Effort: No respiratory distress.  ?Musculoskeletal:  ?   Comments: Noted to ambulate in hall today with walker with NT supervision  ?Neurological:  ?   Mental Status: He is alert.  ?Psychiatric:     ?   Mood and Affect: Mood normal.  ?   Comments: anxious  ? ? ?Data Reviewed: ? ?There are no new results to review at this time. ? ?Family  Communication: none ? ?Disposition: ?Status is: Inpatient ?Remains inpatient appropriate because: awaiting inpatient psychiatry facility  ? ? Planned  Discharge Destination:  Inpatient psychiatric facility ? ? ? ?Time spent: 15 minutes ? ?Author: ?[name], MD ?08/08/2021 1:17 PM ? ?For on call review [email or web address].  ?

## 2021-08-08 NOTE — Plan of Care (Signed)
Pt Anthony Lamb, very anxious and restless in room.  ?IVC continues for suicidal ideations on admission. Sitter at bedside.  ? ?Problem: Education: ?Goal: Knowledge of risk factors and measures for prevention of condition will improve ?Outcome: Progressing ?  ?Problem: Coping: ?Goal: Psychosocial and spiritual needs will be supported ?Outcome: Progressing ?  ?Problem: Respiratory: ?Goal: Will maintain a patent airway ?Outcome: Progressing ?Goal: Complications related to the disease process, condition or treatment will be avoided or minimized ?Outcome: Progressing ?  ?Problem: Activity: ?Goal: Will identify at least one activity in which they can participate ?Outcome: Progressing ?  ?Problem: Coping: ?Goal: Ability to identify and develop effective coping behavior will improve ?Outcome: Progressing ?Goal: Ability to interact with others will improve ?Outcome: Progressing ?Goal: Demonstration of participation in decision-making regarding own care will improve ?Outcome: Progressing ?Goal: Ability to use eye contact when communicating with others will improve ?Outcome: Progressing ?  ?Problem: Health Behavior/Discharge Planning: ?Goal: Identification of resources available to assist in meeting health care needs will improve ?Outcome: Progressing ?  ?Problem: Self-Concept: ?Goal: Will verbalize positive feelings about self ?Outcome: Progressing ?  ?Problem: Education: ?Goal: Knowledge of General Education information will improve ?Description: Including pain rating scale, medication(s)/side effects and non-pharmacologic comfort measures ?Outcome: Progressing ?  ?Problem: Health Behavior/Discharge Planning: ?Goal: Ability to manage health-related needs will improve ?Outcome: Progressing ?  ?Problem: Clinical Measurements: ?Goal: Ability to maintain clinical measurements within normal limits will improve ?Outcome: Progressing ?Goal: Will remain free from infection ?Outcome: Progressing ?Goal: Diagnostic test results will  improve ?Outcome: Progressing ?Goal: Respiratory complications will improve ?Outcome: Progressing ?Goal: Cardiovascular complication will be avoided ?Outcome: Progressing ?  ?Problem: Activity: ?Goal: Risk for activity intolerance will decrease ?Outcome: Progressing ?  ?Problem: Nutrition: ?Goal: Adequate nutrition will be maintained ?Outcome: Progressing ?  ?Problem: Coping: ?Goal: Level of anxiety will decrease ?Outcome: Progressing ?  ?Problem: Elimination: ?Goal: Will not experience complications related to bowel motility ?Outcome: Progressing ?  ?Problem: Pain Managment: ?Goal: General experience of comfort will improve ?Outcome: Progressing ?  ?Problem: Safety: ?Goal: Ability to remain free from injury will improve ?Outcome: Progressing ?  ?Problem: Skin Integrity: ?Goal: Risk for impaired skin integrity will decrease ?Outcome: Progressing ?  ?

## 2021-08-08 NOTE — Progress Notes (Signed)
?   08/08/21 1200  ?Mobility  ?Activity Ambulated with assistance in hallway  ?Level of Assistance Modified independent, requires aide device or extra time  ?Assistive Device Front wheel walker  ?Distance Ambulated (ft) 100 ft  ?Activity Response Tolerated well  ?$Mobility charge 1 Mobility  ? ?Pt agreeable to mobilize this morning. Ambulated about 15f in hall with RW, tolerated well. Noted 8/10 spinal pain. Also stated he did not feel as well as usual today. Left pt in bed, call bell at side. RN/NT notified of session.  ? ?KRudell Cobb ?Mobility Specialist ?Acute Rehab Services ?Office: [phone]? ? ?

## 2021-08-08 NOTE — Consult Note (Signed)
[facility] Consult   Reason for Consult:  Suicide attempt by overdose on cymbalta Referring Physician:  Dr. 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 male patient admitted with suicide attempt by overdose.  Patient presented after overdosing on (60) 60 mg tablets of Cymbalta, he subsequently developed serotonin syndrome and severe hyponatremia that resulted in admission to intensive care for hyponatremia protocol.  08/05/2021: Patient seen and assessed today by the psychiatric nurse practitioner. On todays evaluation he is observed to be sitting on the side of the bed, and reports to me that he is doing well at this time.  Patient presents with improved affect, brightens upon approach.  Patient continues to note high anxiety, although he is able to identify a new coping skills.  He is encouraged to find 1 additional coping skill this week.  He states he has 3 coping skills that have been effective in reducing his anxiety which include walking, deep breathing, and music.  He notes his anxiety is lower, as long as there are no changes made to his medication.  Patient seems to understand that there will need to be changes to his psychotropic medications which include reduction in Klonopin taper, however he only seems to focus on no changes being made to his medication.  Patient  continues to remain focused on his medication.  He tends to perseverate on his need for more medication particularly Ativan or benzodiazepines.  However patient is redirectable, although his thought process does deviate to medications.  He denies suicidal ideations, homicidal ideations and or hallucinations at this time.   Patient is now medically stable discharge to inpatient general psych hospital for crisis stabilization, medication management, and behavior modification. Patient has been declined at multiple acute inpatient facilities, suspect underlying barrier is a diagnosis of dementia in the context of alcohol withdraw and Delirium tremens. Patient has a significant history of alcohol abuse, that was treated with  long term use of benzodiazepines which has now developed into severe dependency.   HPI:  Anthony Lamb is a 66 y.o. male with a past medical history significant for hypertension, GERD, peptic ulcer disease, ankylosing spondylitis, irritable bowel syndrome, chronic pain, anxiety, and depression who presents for suicide attempt by overdose.  According to EMS and patient, at 545 this morning, patient tried to overdose by taking 60 duloxetine tablets.  EMS thought that they were 60 mg tablets by report.  Patient reports she is feeling some waxing waning nausea, lightheadedness, and feels like he has no bowel movement.  He feels very dehydrated and tired.  He also is feeling very anxious and agitated.  He still says he wants to kill himself.  He reports that he was discharged from the emergency department yesterday at [facility] and he still says he is going to try to kill himself he leaves again today.  Past Psychiatric History: Suicidal ideations, generalized anxiety, major depressive disorder recurrent with atypical features, alcohol abuse,  benzodiazepine use disorder severe  Risk to Self: Yes Risk to Others: Denies Prior Inpatient Therapy: Yes most recent [facility] December 2022 Prior  Outpatient Therapy: Denies currently followed by Dr. Nancy Fetter at [facility]  Past Medical History:  Past Medical History:  Diagnosis Date   Alcohol abuse, in remission    Anal fissure    Anemia    Ankylosing spondylitis (HCC)    Anxiety    Arthritis    Chronic diarrhea    Chronic headaches    Chronic pain    Colitis    Colon polyps    Depression    Esophagitis    Gastric polyps    hyperplastic and fundic gland   Gastritis    GERD (gastroesophageal reflux disease)    Hypertension    IBS (irritable bowel syndrome)    Poor dentition    SIADH (syndrome of inappropriate ADH production) ([place])     Past Surgical History:  Procedure Laterality Date   BIOPSY  11/26/2018   Procedure: BIOPSY;  Surgeon: Gatha Mayer, MD;  Location: [facility];  Service: Endoscopy;;   COLONOSCOPY W/ BIOPSIES     COLONOSCOPY WITH PROPOFOL N/A 11/26/2018   Procedure: COLONOSCOPY WITH PROPOFOL;  Surgeon: Gatha Mayer, MD;  Location: [facility];  Service: Endoscopy;  Laterality: N/A;   ESOPHAGOGASTRODUODENOSCOPY     ESOPHAGOGASTRODUODENOSCOPY (EGD) WITH PROPOFOL N/A 11/26/2018   Procedure: ESOPHAGOGASTRODUODENOSCOPY (EGD) WITH PROPOFOL;  Surgeon: Gatha Mayer, MD;  Location: [facility];  Service: Endoscopy;  Laterality: N/A;   HEMOSTASIS CLIP PLACEMENT  11/26/2018   Procedure: HEMOSTASIS CLIP PLACEMENT;  Surgeon: Gatha Mayer, MD;  Location: [facility];  Service: Endoscopy;;   HERNIA REPAIR Bilateral    HOT HEMOSTASIS N/A 11/26/2018   Procedure: HOT HEMOSTASIS (ARGON PLASMA COAGULATION/BICAP);  Surgeon: Gatha Mayer, MD;  [facility];  Service: Endoscopy;  Laterality: N/A;   OPEN REDUCTION INTERNAL FIXATION (ORIF) DISTAL RADIAL FRACTURE Right 02/11/2018   Procedure: OPEN REDUCTION INTERNAL FIXATION (ORIF) DISTAL RADIAL FRACTURE;  Surgeon: Milly Jakob, MD;  Location: [place];  Service: Orthopedics;  Laterality: Right;   POLYPECTOMY  11/26/2018   Procedure: POLYPECTOMY;   Surgeon: Gatha Mayer, MD;  Location: [facility];  Service: Endoscopy;;   TONSILLECTOMY     Family History:  Family History  Problem Relation Age of Onset   Anxiety disorder Mother    Congestive Heart Failure Mother    Crohn's disease Mother    Colon cancer Mother 51   Arthritis Father    High blood pressure Father    Crohn's disease Father    Family Psychiatric  History: Denies Social History:  Social History   Substance and Sexual Activity  Alcohol Use Not Currently     Social History   Substance and Sexual Activity  Drug Use Not Currently   Types: Marijuana    Social History   Socioeconomic History   Marital status: Single    Spouse name: Not on file   Number of children: 0   Years of education: Not on file   Highest education level: Not on file  Occupational History   Not on file  Tobacco Use   Smoking status: Former    Types: Cigarettes    Quit date: 2014    Years since quitting: 9.1   Smokeless tobacco: Never  Vaping Use   Vaping Use: Never used  Substance and Sexual Activity   Alcohol use: Not Currently   Drug use: Not Currently  Types: Marijuana   Sexual activity: Not Currently  Other Topics Concern   Not on file  Social History Narrative   HSG. Long - term disability - unable to work. Lived with his mother in her house - she died May 13, 2023 -    Lives in apartment   Has case worker   Social Determinants of Radio broadcast assistant Strain: Not on file  Food Insecurity: Not on file  Transportation Needs: Not on file  Physical Activity: Not on file  Stress: Not on file  Social Connections: Not on file   Additional Social History:    Allergies:   Allergies  Allergen Reactions   Nsaids Other (See Comments)    GI upset- history of peptic ulcers!!   Diphenhydramine Hcl Other (See Comments)    Restlessness   Flexeril [Cyclobenzaprine] Other (See Comments)    Restlessness   Fluoxetine Other (See Comments)    Made the patient feel  "worse than before" (treatment)   Hctz [Hydrochlorothiazide] Other (See Comments)    Caused to lose sodium when taking with Lisinopril   Rexulti [Brexpiprazole] Other (See Comments)    "Made me not feel right- restless"   Seroquel [Quetiapine] Other (See Comments)    Restless legs and makes the patient sweat- also does not help patient's insomnia. Pt takes this at home in 2023.   Gabapentin Diarrhea   Hydroxyzine Other (See Comments)    Restlessness    Labs:  No results found for this or any previous visit (from the past 48 hour(s)).     Current Facility-Administered Medications  Medication Dose Route Frequency Provider Last Rate Last Admin   0.9 %  sodium chloride infusion   Intravenous PRN Dwyane Dee, MD 10 mL/hr at 07/23/21 0600 Infusion Verify at 07/23/21 0600   acetaminophen (TYLENOL) tablet 650 mg  650 mg Oral Q4H PRN Dwyane Dee, MD   650 mg at 08/08/21 1229   alum & mag hydroxide-simeth (MAALOX/MYLANTA) 200-200-20 MG/5ML suspension 30 mL  30 mL Oral Q4H PRN Dwyane Dee, MD   30 mL at 07/31/21 1053   ARIPiprazole (ABILIFY) tablet 2 mg  2 mg Oral QHS Suella Broad, FNP   2 mg at 08/07/21 2127   clonazePAM (KLONOPIN) tablet 0.5 mg  0.5 mg Oral Q breakfast Suella Broad, FNP   0.5 mg at 08/08/21 8127   And   clonazePAM (KLONOPIN) tablet 1 mg  1 mg Oral QHS Suella Broad, FNP   1 mg at 08/07/21 2127   enoxaparin (LOVENOX) injection 40 mg  40 mg Subcutaneous Q24H Dwyane Dee, MD   40 mg at 08/07/21 2126   feeding supplement (ENSURE ENLIVE / ENSURE PLUS) liquid 237 mL  237 mL Oral TID BM Lavina Hamman, MD   237 mL at 08/08/21 0949   hydrALAZINE (APRESOLINE) tablet 25 mg  25 mg Oral Q4H PRN Dwyane Dee, MD   25 mg at 07/13/21 1701   lisinopril (ZESTRIL) tablet 30 mg  30 mg Oral Daily Dwyane Dee, MD   30 mg at 08/08/21 0946   loratadine (CLARITIN) tablet 10 mg  10 mg Oral Daily Manuella Ghazi, Pratik D, DO   10 mg at 08/08/21 5170   MEDLINE mouth  rinse  15 mL Mouth Rinse BID Dwyane Dee, MD   15 mL at 08/08/21 0949   mirtazapine (REMERON) tablet 7.5 mg  7.5 mg Oral QHS Suella Broad, FNP   7.5 mg at 08/07/21 2128   naphazoline-pheniramine (NAPHCON-A)  0.025-0.3 % ophthalmic solution 1 drop  1 drop Both Eyes QID PRN Manuella Ghazi, Pratik D, DO   1 drop at 07/19/21 1750   oxyCODONE (Oxy IR/ROXICODONE) immediate release tablet 5 mg  5 mg Oral Q12H PRN Manuella Ghazi, Pratik D, DO   5 mg at 08/08/21 0946   pantoprazole (PROTONIX) EC tablet 40 mg  40 mg Oral BID Lavina Hamman, MD   40 mg at 08/08/21 0947   phenol (CHLORASEPTIC) mouth spray 1 spray  1 spray Mouth/Throat PRN Dwyane Dee, MD   1 spray at 07/11/21 2054   polyethylene glycol (MIRALAX / GLYCOLAX) packet 17 g  17 g Oral Daily PRN Dwyane Dee, MD   17 g at 07/06/21 1962   polyvinyl alcohol (LIQUIFILM TEARS) 1.4 % ophthalmic solution 1 drop  1 drop Both Eyes PRN Jennye Boroughs, MD   1 drop at 07/17/21 2106   potassium chloride SA (KLOR-CON M) CR tablet 20 mEq  20 mEq Oral BID Phillips Grout, MD   20 mEq at 08/08/21 0947   propranolol (INDERAL) tablet 20 mg  20 mg Oral TID Mariel Aloe, MD   20 mg at 08/08/21 2297   saccharomyces boulardii (FLORASTOR) capsule 250 mg  250 mg Oral BID Lavina Hamman, MD   250 mg at 08/08/21 9892   simethicone (MYLICON) chewable tablet 80 mg  80 mg Oral QID Lavina Hamman, MD   80 mg at 08/08/21 1307   sodium chloride (OCEAN) 0.65 % nasal spray 1 spray  1 spray Each Nare PRN Dwyane Dee, MD   1 spray at 07/25/21 0920   sucralfate (CARAFATE) tablet 1 g  1 g Oral QID Dwyane Dee, MD   1 g at 08/08/21 1307   zolpidem (AMBIEN) tablet 5 mg  5 mg Oral Standley Brooking, MD   5 mg at 08/07/21 2127    Musculoskeletal: Strength & Muscle Tone: within normal limits Gait & Station: normal Patient leans: N/A     Psychiatric Specialty Exam:  Presentation  General Appearance: Appropriate for Environment; Casual  Eye Contact:Fair  Speech:Clear and  Coherent; Normal Rate  Speech Volume:Normal  Handedness:Right   Mood and Affect  Mood:Anxious  Affect:Appropriate; Congruent   Thought Process  Thought Processes:Coherent; Linear  Descriptions of Associations:Intact  Orientation:Full (Time, Place and Person)  Thought Content:Logical  History of Schizophrenia/Schizoaffective disorder:No  Duration of Psychotic Symptoms:N/A  Hallucinations:No data recorded  Ideas of Reference:None  Suicidal Thoughts:No data recorded  Homicidal Thoughts:No data recorded   Sensorium  Memory:Immediate Good; Recent Good; Remote Good  Judgment:Poor  Insight:Poor   Executive Functions  Concentration:Fair  Attention Span:Fair  Recall:Poor  Fund of Knowledge:Poor  Language:Poor   Psychomotor Activity  Psychomotor Activity:No data recorded   Assets  Assets:Communication Skills; Financial Resources/Insurance; Physical Health; Resilience; Social Support   Sleep  Sleep:No data recorded   Physical Exam: Physical Exam Vitals and nursing note reviewed.  Constitutional:      Appearance: Normal appearance. He is normal weight.  HENT:     Head: Normocephalic.  Eyes:     General: Lids are normal.  Neurological:     General: No focal deficit present.     Mental Status: He is alert and oriented to person, place, and time. Mental status is at baseline.     Motor: Tremor present. No atrophy.  Psychiatric:        Attention and Perception: Attention and perception normal.        Mood and Affect: Mood  is anxious and depressed. Affect is tearful.        Speech: Speech normal.        Behavior: Behavior is withdrawn. Behavior is cooperative.        Thought Content: Thought content includes suicidal ideation.        Cognition and Memory: Cognition and memory normal.   Review of Systems  Neurological:  Positive for tremors, focal weakness and weakness.  Psychiatric/Behavioral:  Positive for depression, substance abuse (alcohol  use, last drank 1 month ago) and suicidal ideas (suicide attempt). The patient is nervous/anxious and has insomnia (takes [place]).   All other systems reviewed and are negative. Blood pressure 132/73, pulse 69, temperature 98.4 F (36.9 C), temperature source Oral, resp. rate 18, height 5' 10"  (1.778 m), weight 65 kg, SpO2 100 %. Body mass index is 20.55 kg/m.   Treatment Plan Summary: Daily contact with patient to assess and evaluate symptoms and progress in treatment, Medication management, and Plan     Recommended for inpatient psychiatric treatment.   -Patient is to remain under involuntary commitment at this time, as he continues to be a danger to himself and unable to contract for safety.    -Will continue  Abilify 52m mg p.o. nightly to target psychosis, depressive symptoms, anxiety, and suicidal ideations.   -Will continue Klonopin 0.5 mg p.o. daily every morning; and Klonopin 1 mg p.o. nightly. Will likely reduce klonopin taper next week.   -Will continue  mirtazapine 7.5 mg p.o. nightly.  If mirtazapine proven to be effective will begin to decrease his Ambien dose.  -Patient was also increased on propranolol 267mp.o. 3 times daily.  Insomnia-Patient continues to endorse poor sleep habits recommend documenting sleep hours byt night shift.  -He is medically stable to discharge to inpatient psychiatric facility. Patient has been declined at multiple facilities [facility] Patient with chronic suicidality, multiple suicide attempts of high lethality, no safe disposition plan at this time. Will continue to work towards disposition for this patient will likely require long term care and supervision.   -Continue to use coping skills to help with behavior modification and anxiety reducing measures.  Disposition: Recommend psychiatric Inpatient admission when medically cleared. IVC  TaSuella BroadFNP 08/08/2021 3:22 PM

## 2021-08-08 NOTE — TOC Progression Note (Signed)
Transition of Care (TOC) - Progression Note  ? ? ?Patient Details  ?Name: Anthony Lamb ?MRN: [id number] ?Date of Birth: [birth year] ? ?Transition of Care (TOC) CM/SW Contact  ?Trish Mage, LCSW ?Phone Number: ?08/08/2021, 3:32 PM ? ?Clinical Narrative:   Confirmed that patient remains on [facility] wait list. Received call back from  [phone] worker.  She agrees that patient could benefit from ACT team, and is going to check to see if he is eligible.  Her agency provides that level of service. TOC will continue to follow during the course of hospitalization. ? ? ? ? ?  ?  ? ?Expected Discharge Plan and Services ?  ?  ?  ?  ?  ?                ?  ?  ?  ?  ?  ?  ?  ?  ?  ?  ? ? ?Social Determinants of Health (SDOH) Interventions ?  ? ?Readmission Risk Interventions ?No flowsheet data found. ? ?

## 2021-08-09 LAB — BASIC METABOLIC PANEL
Anion gap: 6 (ref 5–15)
BUN: 13 mg/dL (ref 8–23)
CO2: 29 mmol/L (ref 22–32)
Calcium: 8.8 mg/dL — ABNORMAL LOW (ref 8.9–10.3)
Chloride: 95 mmol/L — ABNORMAL LOW (ref 98–111)
Creatinine, Ser: 0.64 mg/dL (ref 0.61–1.24)
GFR, Estimated: >60 mL/min (ref >60)
Glucose, Bld: 91 mg/dL (ref 70–99)
Potassium: 4.4 mmol/L (ref 3.5–5.1)
Sodium: 130 mmol/L — ABNORMAL LOW (ref 135–145)

## 2021-08-09 MED ORDER — LOPERAMIDE HCL 2 MG PO CAPS
2.0000 mg | ORAL_CAPSULE | ORAL | Status: DC | PRN
  Administered 2021-08-09 – 2021-08-21 (×15): 2 mg via ORAL
  Filled 2021-08-09 (×17): qty 1

## 2021-08-09 NOTE — Plan of Care (Signed)
No acute events overnight. ?Problem: Education: ?Goal: Knowledge of risk factors and measures for prevention of condition will improve ?Outcome: Progressing ?  ?Problem: Coping: ?Goal: Psychosocial and spiritual needs will be supported ?Outcome: Progressing ?  ?Problem: Respiratory: ?Goal: Will maintain a patent airway ?Outcome: Progressing ?Goal: Complications related to the disease process, condition or treatment will be avoided or minimized ?Outcome: Progressing ?  ?Problem: Activity: ?Goal: Will identify at least one activity in which they can participate ?Outcome: Progressing ?  ?Problem: Coping: ?Goal: Ability to identify and develop effective coping behavior will improve ?Outcome: Progressing ?Goal: Ability to interact with others will improve ?Outcome: Progressing ?Goal: Demonstration of participation in decision-making regarding own care will improve ?Outcome: Progressing ?Goal: Ability to use eye contact when communicating with others will improve ?Outcome: Progressing ?  ?Problem: Health Behavior/Discharge Planning: ?Goal: Identification of resources available to assist in meeting health care needs will improve ?Outcome: Progressing ?  ?Problem: Self-Concept: ?Goal: Will verbalize positive feelings about self ?Outcome: Progressing ?  ?Problem: Education: ?Goal: Knowledge of General Education information will improve ?Description: Including pain rating scale, medication(s)/side effects and non-pharmacologic comfort measures ?Outcome: Progressing ?  ?Problem: Health Behavior/Discharge Planning: ?Goal: Ability to manage health-related needs will improve ?Outcome: Progressing ?  ?Problem: Clinical Measurements: ?Goal: Ability to maintain clinical measurements within normal limits will improve ?Outcome: Progressing ?Goal: Will remain free from infection ?Outcome: Progressing ?Goal: Diagnostic test results will improve ?Outcome: Progressing ?Goal: Respiratory complications will improve ?Outcome:  Progressing ?Goal: Cardiovascular complication will be avoided ?Outcome: Progressing ?  ?Problem: Activity: ?Goal: Risk for activity intolerance will decrease ?Outcome: Progressing ?  ?Problem: Nutrition: ?Goal: Adequate nutrition will be maintained ?Outcome: Progressing ?  ?Problem: Coping: ?Goal: Level of anxiety will decrease ?Outcome: Progressing ?  ?Problem: Elimination: ?Goal: Will not experience complications related to bowel motility ?Outcome: Progressing ?  ?Problem: Pain Managment: ?Goal: General experience of comfort will improve ?Outcome: Progressing ?  ?Problem: Safety: ?Goal: Ability to remain free from injury will improve ?Outcome: Progressing ?  ?Problem: Skin Integrity: ?Goal: Risk for impaired skin integrity will decrease ?Outcome: Progressing ?  ?

## 2021-08-09 NOTE — Progress Notes (Signed)
?   08/09/21 1100  ?Mobility  ?Activity Ambulated with assistance in hallway  ?Level of Assistance Modified independent, requires aide device or extra time  ?Assistive Device Front wheel walker  ?Distance Ambulated (ft) 120 ft  ?Activity Response Tolerated well  ?$Mobility charge 1 Mobility  ? ?Pt agreeable to mobilize this morning. Ambulated about 157f in hall with RW, tolerated well. No complaints. Left pt in bed, call bell at side. RN/NT notified of session. Sitter present. ? ?KRudell Cobb ?Mobility Specialist ?Acute Rehab Services ?Office: [phone]? ? ?

## 2021-08-09 NOTE — Progress Notes (Signed)
?Progress Note ? ? ?Patient: Anthony Lamb YIF:[id number] DOB: 1955-06-12 DOA: 07/01/2021     38 ?DOS: the patient was seen and examined on 08/09/2021 ?  ?Brief hospital course: ?66 year old man presented after an intentional overdose of duloxetine. Found to have incidental Covid, treated. Sever hyponatremia w/ history of SIADH, seen by nephrology and treated with hypertonic saline. Likely from significant inappropriate ADH activation in the setting of duloxetine overdose likely in a patient with underlying mild hyponatremia from EtOH use. ?--1/27 admitted for suicidal drug overdose. ?--1/28 started on Molnupiravir for incidental COVID ?--1/29 transferred to stepdown unit for 3% hypertonic saline for severe hyponatremia. ?--2/2 PICC line placed for hypertonic saline ?--2/4 nephrology signed off ?--2/7 Deemed medically stable to transfer to inpatient psychiatric facility. ?--2/25 PICC line removed. ?--Remains medically stable as of 2/28. ? ?Assessment and Plan: ?* Overdose of antidepressant, intentional self-harm, initial encounter [facility]) ?--s/p suicide attempt with intentional overdose on Cymbalta without significant sequela   ?-- Followed by psychiatry, unable to contract for safety, continues on IVC, needs inpatient psychiatric treatment.   ?--Has a longstanding history of chronic benzodiazepine use likely secondary to alcohol use disorder ?--Psychiatry recommended to start patient on a benzodiazepine taper, they have notified all parties involved in prescribing the med to pt.  ?--Continue one-to-one sitter. ?--Medically stable for discharge. ? 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 ?Major depression, recurrent ([place]) ?--Psychiatry following intermittently, patient with chronic suicidality, multiple suicide attempts of high lethality ?--Klonopin 0.5 mg p.o. daily every morning; and Klonopin 1 mg p.o. nightly.  Psychiatry will likely reduce klonopin taper in future ?--mirtazapine 7.5 mg p.o. nightly for insomnia ?--Propranolol 80m p.o. 3 times daily for tremors and anxiety ?--Abilify 225mmg p.o. nightly for Psychotic symptoms.  ?--He is medically stable to discharge to inpatient psychiatric facility. [facility] ? ?Gastroesophageal reflux disease ?-Continue protonix , carafete, simethicone, probiotics.  ? ?Tremor ?Chronic issue. Started propranolol ?-Continue propranolol, titrate as needed/tolerated ? ?Alcohol abuse ?Noted. ? ?[place]?Seen on x [place], known past history.  ? 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 Treated with 3% saline.  ?--stable, Na+ mildly low ? ?Essential hypertension ?-Continue lisinopril and propranolol  ? ?Chest pain-resolved as of 08/04/2021 ?Not typical. Multiple EKG normal. No further evaluation. ? ?Allergic reaction-resolved as of 08/04/2021 ?--unclear etiology. Occurred on 2/3. Resolved with Benadryl, Pepcid and Solu-medrol. ? ?COVID-19 virus infection-resolved as of 08/04/2021 ?--Diagnosed on 07/01/21. Incidental. Treated with molnupiravir. Isolation discontinued. ? ? ? ? ?  ? ?Subjective:  ?Has some back pain ?Reports diarrhea (has IBS) ? ?Physical Exam: ?Vitals:  ? 08/08/21 2125 08/09/21 0534 08/09/21 0700 08/09/21 1353  ?BP: (!) 106/53 110/61  115/62  ?Pulse: 61 69  64  ?Resp: 16 16  18   ?Temp: 97.8 ?F (36.6 ?C) 98.4 ?F (36.9 ?C)  98.5 ?F (36.9 ?C)  ?TempSrc: Oral Oral  Oral  ?SpO2: 98% 97%  100%  ?Weight:   66.4 kg   ?Height:      ? ?Physical Exam ?Vitals reviewed.  ?Constitutional:   ?   General: He is not in acute distress. ?   Appearance: He is not ill-appearing or toxic-appearing.  ?Cardiovascular:  ?   Rate and Rhythm: Normal rate and regular rhythm.  ?   Heart sounds: No murmur heard. ?Pulmonary:  ?  Effort: Pulmonary effort is normal. No  respiratory distress.  ?   Breath sounds: No wheezing, rhonchi or rales.  ?Abdominal:  ?   Palpations: Abdomen is soft.  ?Neurological:  ?   Mental Status: He is alert.  ?Psychiatric:  ?   Comments: anxious  ? ? ?Data Reviewed: ? ?Na+ 130, stable ? ?Family Communication: none ? ?Disposition: ?Status is: Inpatient ?Remains inpatient appropriate because: awaiting inpatient psychiatric facility availability ? Planned Discharge Destination:  Inpatient psychiatric facility ? ? ? ?Time spent: 15 minutes ? ?Author: ?[name], MD ?08/09/2021 2:41 PM ? ?For on call review [email or web address].  ?

## 2021-08-09 NOTE — Plan of Care (Signed)
Pt aox4, very anxious and restless in room.  ?IVC continues for suicidal ideations on admission. Sitter at bedside.  ? ?Problem: Education: ?Goal: Knowledge of risk factors and measures for prevention of condition will improve ?Outcome: Progressing ?  ?Problem: Coping: ?Goal: Psychosocial and spiritual needs will be supported ?Outcome: Progressing ?  ?Problem: Respiratory: ?Goal: Will maintain a patent airway ?Outcome: Progressing ?Goal: Complications related to the disease process, condition or treatment will be avoided or minimized ?Outcome: Progressing ?  ?Problem: Activity: ?Goal: Will identify at least one activity in which they can participate ?Outcome: Progressing ?  ?Problem: Coping: ?Goal: Ability to identify and develop effective coping behavior will improve ?Outcome: Progressing ?Goal: Ability to interact with others will improve ?Outcome: Progressing ?Goal: Demonstration of participation in decision-making regarding own care will improve ?Outcome: Progressing ?Goal: Ability to use eye contact when communicating with others will improve ?Outcome: Progressing ?  ?Problem: Health Behavior/Discharge Planning: ?Goal: Identification of resources available to assist in meeting health care needs will improve ?Outcome: Progressing ?  ?Problem: Self-Concept: ?Goal: Will verbalize positive feelings about self ?Outcome: Progressing ?  ?Problem: Education: ?Goal: Knowledge of General Education information will improve ?Description: Including pain rating scale, medication(s)/side effects and non-pharmacologic comfort measures ?Outcome: Progressing ?  ?Problem: Health Behavior/Discharge Planning: ?Goal: Ability to manage health-related needs will improve ?Outcome: Progressing ?  ?Problem: Clinical Measurements: ?Goal: Ability to maintain clinical measurements within normal limits will improve ?Outcome: Progressing ?Goal: Will remain free from infection ?Outcome: Progressing ?Goal: Diagnostic test results will  improve ?Outcome: Progressing ?Goal: Respiratory complications will improve ?Outcome: Progressing ?Goal: Cardiovascular complication will be avoided ?Outcome: Progressing ?  ?Problem: Activity: ?Goal: Risk for activity intolerance will decrease ?Outcome: Progressing ?  ?Problem: Nutrition: ?Goal: Adequate nutrition will be maintained ?Outcome: Progressing ?  ?Problem: Coping: ?Goal: Level of anxiety will decrease ?Outcome: Progressing ?  ?Problem: Elimination: ?Goal: Will not experience complications related to bowel motility ?Outcome: Progressing ?  ?Problem: Pain Managment: ?Goal: General experience of comfort will improve ?Outcome: Progressing ?  ?Problem: Safety: ?Goal: Ability to remain free from injury will improve ?Outcome: Progressing ?  ?Problem: Skin Integrity: ?Goal: Risk for impaired skin integrity will decrease ?Outcome: Progressing ?  ?

## 2021-08-10 NOTE — Assessment & Plan Note (Signed)
See above

## 2021-08-10 NOTE — Plan of Care (Signed)
?  Problem: Education: ?Goal: Knowledge of risk factors and measures for prevention of condition will improve ?Outcome: Progressing ?  ?Problem: Coping: ?Goal: Psychosocial and spiritual needs will be supported ?Outcome: Progressing ?  ?Problem: Respiratory: ?Goal: Will maintain a patent airway ?Outcome: Progressing ?Goal: Complications related to the disease process, condition or treatment will be avoided or minimized ?Outcome: Progressing ?  ?Problem: Activity: ?Goal: Will identify at least one activity in which they can participate ?Outcome: Progressing ?  ?Problem: Coping: ?Goal: Ability to identify and develop effective coping behavior will improve ?Outcome: Progressing ?Goal: Ability to interact with others will improve ?Outcome: Progressing ?Goal: Demonstration of participation in decision-making regarding own care will improve ?Outcome: Progressing ?Goal: Ability to use eye contact when communicating with others will improve ?Outcome: Progressing ?  ?Problem: Health Behavior/Discharge Planning: ?Goal: Identification of resources available to assist in meeting health care needs will improve ?Outcome: Progressing ?  ?Problem: Self-Concept: ?Goal: Will verbalize positive feelings about self ?Outcome: Progressing ?  ?Problem: Education: ?Goal: Knowledge of General Education information will improve ?Description: Including pain rating scale, medication(s)/side effects and non-pharmacologic comfort measures ?Outcome: Progressing ?  ?Problem: Health Behavior/Discharge Planning: ?Goal: Ability to manage health-related needs will improve ?Outcome: Progressing ?  ?Problem: Clinical Measurements: ?Goal: Ability to maintain clinical measurements within normal limits will improve ?Outcome: Progressing ?Goal: Will remain free from infection ?Outcome: Progressing ?Goal: Diagnostic test results will improve ?Outcome: Progressing ?Goal: Respiratory complications will improve ?Outcome: Progressing ?Goal: Cardiovascular  complication will be avoided ?Outcome: Progressing ?  ?Problem: Activity: ?Goal: Risk for activity intolerance will decrease ?Outcome: Progressing ?  ?Problem: Nutrition: ?Goal: Adequate nutrition will be maintained ?Outcome: Progressing ?  ?Problem: Coping: ?Goal: Level of anxiety will decrease ?Outcome: Progressing ?  ?Problem: Elimination: ?Goal: Will not experience complications related to bowel motility ?Outcome: Progressing ?  ?Problem: Pain Managment: ?Goal: General experience of comfort will improve ?Outcome: Progressing ?  ?Problem: Safety: ?Goal: Ability to remain free from injury will improve ?Outcome: Progressing ?  ?Problem: Skin Integrity: ?Goal: Risk for impaired skin integrity will decrease ?Outcome: Progressing ?  ?

## 2021-08-10 NOTE — TOC Progression Note (Signed)
Transition of Care (TOC) - Progression Note  ? ? ?Patient Details  ?Name: BRESLIN HEMANN ?MRN: [id number] ?Date of Birth: 10-21-1955 ? ?Transition of Care (TOC) CM/SW Contact  ?Trish Mage, LCSW ?Phone Number: ?08/10/2021, 2:06 PM ? ?Clinical Narrative:   IVC renewed.  Due again 3/14.  Left message for Sharyn Lull with CST to find out what she has determined about ACT as a possibility for Mr Gingras. TOC will continue to follow during the course of hospitalization. ? ? ? ? ?  ?  ? ?Expected Discharge Plan and Services ?  ?  ?  ?  ?  ?                ?  ?  ?  ?  ?  ?  ?  ?  ?  ?  ? ? ?Social Determinants of Health (SDOH) Interventions ?  ? ?Readmission Risk Interventions ?No flowsheet data found. ? ?

## 2021-08-10 NOTE — Progress Notes (Signed)
PROGRESS NOTE DRADEN COTTINGHAM  OEH:[id number] DOB: 20-Oct-1955 DOA: 07/01/2021 PCP: Sandi Mariscal, MD   Brief Narrative/Hospital Course: 66 year old man presented after an intentional overdose of duloxetine and admitted on 1/273 for suicidal drug overdose incidental Covid, treate,severe hyponatremia w/ history of SIADH, seen by nephrology and treated with hypertonic saline, it was felt to be from significant inappropriate ADH activation in the setting of duloxetine overdose likely in a patient with underlying mild hyponatremia from EtOH use. Hosp course listed as- admitted 127. On 1/28 started on Molnupiravir for incidental COVID- 1/29 transferred to stepdown unit for 3% hypertonic saline for severe hyponatremia, 2/4 nephrology signed off and 07/12/21 Deemed medically stable to transfer to inpatient psychiatric facility, 07/30/21  PICC line removed. Patient has been medically stable for discharge pending for inpatient psych.     Subjective: Seen and examined this morning complains of some loose stool, no new complaints complains of being anxious complains of chronic back pain No nausea vomiting or abdominal pain  Assessment and Plan: * Overdose of antidepressant, intentional self-harm, initial encounter ([place]) Suicidal attempt with overdose of antidepressant Major depression recurrent Sedative, hypnotic or anxiolytic induced anxiety disorder: Presented with intentional overdose of duloxetine.  Patient currently on multiple psychotropic medication, chronic benzo and multiple suicide attempts of high lethality in the past. Followed by psychiatry, unable to contract for safety,remains on IVC. Continue with current Abilify 2 mg qhs,Klonopin 0.5 daily,1 mg qhs,Remeron 7.5 mg qhs and inderal 20 mg  Tid-for tremors,on  Ambien PRN. Has a longstanding history of chronic benzodiazepine use likely secondary to alcohol use disorder Psychiatry recommended to start patient on a benzodiazepine taper and are managing  it-they have notified all parties involved in prescribing the med to pt.  Continue one-to-one sitter.Remains medically stable, waiting for inpatient psych bed.  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  Gastroesophageal reflux disease Continue PPI,carafete, simethicone, probiotics.   Tremor Continue Inderal as  Alcohol abuse Noted.  Has been here over a month no risk of withdrawal  Ankylosing spondylitis ([place]) Seen on x ray, known past history.   SIADH (syndrome of inappropriate ADH production) ([facility]) Due to duloxetine overdose, managed with hypertonic saline now sodium is stable and improved.  Nephrology signed off  Essential hypertension BP well controlled, on lisinopril 30 mg and also getting Inderal for tremors   Chest pain-resolved as of 08/04/2021 Not typical. Multiple EKG normal. No further evaluation.  Allergic reaction-resolved as of 08/04/2021 --unclear etiology. Occurred on 2/3. Resolved with Benadryl, Pepcid and Solu-medrol.  COVID-19 virus infection-resolved as of 08/04/2021 --Diagnosed on 07/01/21. Incidental. Treated with molnupiravir. Isolation discontinued.  DVT prophylaxis: enoxaparin (LOVENOX) injection 40 mg Start: 07/01/21 2200 SCDs Start: 07/01/21 1108 Code Status:   Code Status: Full Code Family Communication: plan of care discussed with patient/RN at bedside.  Disposition: Currently is medically stable for discharge. Status is: Inpatient Remains inpatient appropriate because: Unsafe disposition awaiting for inpatient psych Objective: Vitals last 24 hrs: Vitals:   08/09/21 1353 08/09/21 2049 08/10/21 0634 08/10/21 1011  BP: 115/62 110/62 (!) 105/55   Pulse: 64 62 68   Resp: 18 16 16    Temp: 98.5 F (36.9 C) (!) 97.5 F (36.4 C) 97.7 F (36.5 C)   TempSrc: Oral Oral Oral    SpO2: 100% 99% 98%   Weight:    65.2 kg  Height:  Weight change:   Physical Examination: General exam: AA,older than stated age, weak appearing. HEENT:Oral mucosa moist, Ear/Nose WNL grossly, dentition normal. Respiratory system: bilaterally diminished BS, no use of accessory muscle Cardiovascular system: S1 & S2 +, No JVD,. Gastrointestinal system: Abdomen soft,NT,ND, BS+ Nervous System:Alert, awake, moving extremities and grossly nonfocal Extremities: LE edema none,distal peripheral pulses palpable.  Skin: No rashes,no icterus. MSK: Normal muscle bulk,tone, power  Medications reviewed:  Scheduled Meds:  ARIPiprazole  2 mg Oral QHS   clonazePAM  0.5 mg Oral Q breakfast   And   clonazePAM  1 mg Oral QHS   enoxaparin (LOVENOX) injection  40 mg Subcutaneous Q24H   feeding supplement  237 mL Oral TID BM   lisinopril  30 mg Oral Daily   loratadine  10 mg Oral Daily   mouth rinse  15 mL Mouth Rinse BID   mirtazapine  7.5 mg Oral QHS   pantoprazole  40 mg Oral BID   potassium chloride  20 mEq Oral BID   propranolol  20 mg Oral TID   saccharomyces boulardii  250 mg Oral BID   simethicone  80 mg Oral QID   sucralfate  1 g Oral QID   zolpidem  5 mg Oral QHS   Continuous Infusions:  sodium chloride 10 mL/hr at 07/23/21 0600      Diet Order             Diet regular Room service appropriate? Yes; Fluid consistency: Thin  Diet effective now                            Intake/Output Summary (Last 24 hours) at 08/10/2021 1128 Last data filed at 08/10/2021 1012 Gross per 24 hour  Intake 845 ml  Output --  Net 845 ml   Net IO Since Admission: 20,684.03 mL [08/10/21 1128]  Wt Readings from Last 3 Encounters:  08/10/21 65.2 kg  06/29/21 63.5 kg  06/22/21 65.8 kg     Unresulted Labs (From admission, onward)    None     Data Reviewed: I have personally reviewed following labs and imaging studies CBC: No results for input(s): WBC, NEUTROABS, HGB, HCT, MCV,  PLT in the last 168 hours. Basic Metabolic Panel: Recent Labs  Lab 08/09/21 0532  NA 130*  K 4.4  CL 95*  CO2 29  GLUCOSE 91  BUN 13  CREATININE 0.64  CALCIUM 8.8*   GFR: Estimated Creatinine Clearance: 84.9 mL/min (by C-G formula based on SCr of 0.64 mg/dL). Liver Function Tests: No results for input(s): AST, ALT, ALKPHOS, BILITOT, PROT, ALBUMIN in the last 168 hours. No results for input(s): LIPASE, AMYLASE in the last 168 hours. No results for input(s): AMMONIA in the last 168 hours. Coagulation Profile: No results for input(s): INR, PROTIME in the last 168 hours. Cardiac Enzymes: No results for input(s): CKTOTAL, CKMB, CKMBINDEX, TROPONINI in the last 168 hours. BNP (last 3 results) No results for input(s): PROBNP in the last 8760 hours. HbA1C: No results for input(s): HGBA1C in the last 72 hours. CBG: No results for input(s): GLUCAP in the last 168 hours. Lipid Profile: No results for input(s): CHOL, HDL, LDLCALC, TRIG, CHOLHDL, LDLDIRECT in the last 72 hours. Thyroid Function Tests: No results for input(s): TSH, T4TOTAL, FREET4, T3FREE, THYROIDAB in the last 72 hours. Anemia Panel: No results for input(s): VITAMINB12, FOLATE, FERRITIN, TIBC, IRON, RETICCTPCT in the last 72 hours. Sepsis Labs: No results for input(s): PROCALCITON,  LATICACIDVEN in the last 168 hours.  No results found for this or any previous visit (from the past 240 hour(s)).  Antimicrobials: Anti-infectives (From admission, onward)    Start     Dose/Rate Route Frequency Ordered Stop   07/01/21 2200  nirmatrelvir/ritonavir EUA (PAXLOVID) 3 tablet  Status:  Discontinued        3 tablet Oral 2 times daily 07/01/21 1900 07/01/21 1935   07/01/21 2200  molnupiravir EUA (LAGEVRIO) capsule 800 mg        4 capsule Oral 2 times daily 07/01/21 1935 07/06/21 2159      Culture/Microbiology    Component Value Date/Time   SDES  06/04/2021 1014    URINE, CLEAN CATCH Performed at [facility], [address], [place], [place] [address]    [facility]  06/04/2021 1014    NONE Performed at [facility], [address] [place], [place] [address]    CULT  06/04/2021 1014    NO GROWTH Performed at [facility] [address] [place], [place] [address]    REPTSTATUS 06/05/2021 FINAL 06/04/2021 1014    Other culture-see note  Radiology Studies: No results found.   LOS: 33 days   Antonieta Pert, MD [facility]  08/10/2021, 11:28 AM

## 2021-08-11 DIAGNOSIS — T50902A Poisoning by unspecified drugs, medicaments and biological substances, intentional self-harm, initial encounter: Secondary | ICD-10-CM | POA: Diagnosis not present

## 2021-08-11 DIAGNOSIS — T43202A Poisoning by unspecified antidepressants, intentional self-harm, initial encounter: Secondary | ICD-10-CM | POA: Diagnosis not present

## 2021-08-11 MED ORDER — CLONAZEPAM 1 MG PO TABS
1.0000 mg | ORAL_TABLET | Freq: Every day | ORAL | Status: DC
  Administered 2021-08-11 – 2021-08-21 (×11): 1 mg via ORAL
  Filled 2021-08-11 (×12): qty 1

## 2021-08-11 MED ORDER — CLONAZEPAM 0.5 MG PO TABS
0.2500 mg | ORAL_TABLET | Freq: Every morning | ORAL | Status: DC
  Administered 2021-08-12 – 2021-08-22 (×11): 0.25 mg via ORAL
  Filled 2021-08-11 (×12): qty 1

## 2021-08-11 MED ORDER — MIRTAZAPINE 15 MG PO TABS
15.0000 mg | ORAL_TABLET | Freq: Every day | ORAL | Status: DC
  Administered 2021-08-11 – 2021-08-21 (×11): 15 mg via ORAL
  Filled 2021-08-11 (×11): qty 1

## 2021-08-11 NOTE — Consult Note (Signed)
[facility] Consult   Reason for Consult:  Suicide attempt by overdose on cymbalta Referring Physician:  Dr. Sarajane Jews  Patient Identification: Anthony Lamb MRN:  [id number] Principal Diagnosis: Overdose of antidepressant, intentional self-harm, initial encounter [facility]) Diagnosis:  Principal Problem:   Overdose of antidepressant, intentional self-harm, initial encounter [facility]) Active Problems:   Essential hypertension   Major depression, recurrent ([place])   SIADH (syndrome of inappropriate ADH production) ([place])   Gastroesophageal reflux disease   Alcohol abuse   Suicide attempt ([place])   Tremor   Sedative, hypnotic, 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 male patient admitted with suicide attempt by overdose.  Patient presented after overdosing on (60) 60 mg tablets of Cymbalta, he subsequently developed serotonin syndrome and severe hyponatremia that resulted in admission to intensive care for hyponatremia protocol.  08/11/2021: Patient seen and assessed today by the psychiatric nurse practitioner. On todays evaluation he is observed to be sitting on the side of the bed, and states  reports to me that he is doing well at this time.  Patient presents with improved affect, brightens upon approach. Patient continues to endorse abnormally high levels of anxiety, rating his anxiety 8/10 with 10 being the worse on likert scale. He States he was unable to complete his previous homework assignment but is able to identify a new task while in the room. He is able to read through the list of coping skill, and picked " start saying positive things. "  He is encouraged to begin using this skill immediately. He further denies any suicidal ideations stating "I want to live. I dont want to die. "Patient was advised we need to adjust  his Klonopin dose at this time, and while he did not agree with this he was able to participate in treatment plan. Patient advised me he would rather have his morning dose reduced vs night time dose. He then begins to ruminate about his medications and needing medications to sleep " You aren't changing my Ambien and my oxycodone are you? I wish you werent changing my Klonpin. "   Patient continues to remain focused on his medication.  He tends to perseverate on his need for more medication particularly Ativan or benzodiazepines.  However patient is redirectable, although his thought process does deviate to medications.  He denies suicidal ideations, homicidal ideations and or hallucinations at this time.   Patient is now medically stable discharge to inpatient general psych hospital for crisis stabilization, medication management, and behavior modification. Patient has been declined at multiple acute inpatient facilities, suspect underlying barrier is a diagnosis of dementia in the context of alcohol withdraw and Delirium tremens. Patient has a significant history of alcohol abuse, that was treated with  long term use of benzodiazepines which has now developed into severe dependency. Patient has been in the hospital almost 6 weeks, Will likely begin moving towards long term care such as skilled nursing or Alternative living facility. Patient will not do well if he were to return home, even with ACT/CST team services.   HPI:  Anthony Lamb is a 66 y.o. male with a past medical history significant for hypertension, GERD, peptic ulcer disease, ankylosing spondylitis, irritable bowel syndrome, chronic pain, anxiety, and depression who presents for suicide attempt by overdose.  According to EMS and patient, at 545 this morning,  patient tried to overdose by taking 60 duloxetine tablets.  EMS thought that they were 60 mg tablets by report.  Patient reports she is feeling some waxing waning nausea, lightheadedness,  and feels like he has no bowel movement.  He feels very dehydrated and tired.  He also is feeling very anxious and agitated.  He still says he wants to kill himself.  He reports that he was discharged from the emergency department yesterday at [facility] and he still says he is going to try to kill himself he leaves again today.  Past Psychiatric History: Suicidal ideations, generalized anxiety, major depressive disorder recurrent with atypical features, alcohol abuse, benzodiazepine use disorder severe  Risk to Self: Yes Risk to Others: Denies Prior Inpatient Therapy: Yes most recent [facility] December 2022 Prior Outpatient Therapy: Denies currently followed by Dr. Nancy Fetter at [facility].  Past Medical History:  Past Medical History:  Diagnosis Date   Alcohol abuse, in remission    Anal fissure    Anemia    Ankylosing spondylitis (HCC)    Anxiety    Arthritis    Chronic diarrhea    Chronic headaches    Chronic pain    Colitis    Colon polyps    Depression    Esophagitis    Gastric polyps    hyperplastic and fundic gland   Gastritis    GERD (gastroesophageal reflux disease)    Hypertension    IBS (irritable bowel syndrome)    Poor dentition    SIADH (syndrome of inappropriate ADH production) ([place])     Past Surgical History:  Procedure Laterality Date   BIOPSY  11/26/2018   Procedure: BIOPSY;  Surgeon: Gatha Mayer, MD;  Location: [facility];  Service: Endoscopy;;   COLONOSCOPY W/ BIOPSIES     COLONOSCOPY WITH PROPOFOL N/A 11/26/2018   Procedure: COLONOSCOPY WITH PROPOFOL;  Surgeon: Gatha Mayer, MD;  Location: [facility];  Service: Endoscopy;  Laterality: N/A;   ESOPHAGOGASTRODUODENOSCOPY     ESOPHAGOGASTRODUODENOSCOPY (EGD) WITH PROPOFOL N/A 11/26/2018   Procedure: ESOPHAGOGASTRODUODENOSCOPY (EGD) WITH PROPOFOL;  Surgeon: Gatha Mayer, MD;  Location: [facility];  Service: Endoscopy;  Laterality: N/A;   HEMOSTASIS CLIP PLACEMENT  11/26/2018   Procedure:  HEMOSTASIS CLIP PLACEMENT;  Surgeon: Gatha Mayer, MD;  Location: [facility];  Service: Endoscopy;;   HERNIA REPAIR Bilateral    HOT HEMOSTASIS N/A 11/26/2018   Procedure: HOT HEMOSTASIS (ARGON PLASMA COAGULATION/BICAP);  Surgeon: Gatha Mayer, MD;  Location: [facility];  Service: Endoscopy;  Laterality: N/A;   OPEN REDUCTION INTERNAL FIXATION (ORIF) DISTAL RADIAL FRACTURE Right 02/11/2018   Procedure: OPEN REDUCTION INTERNAL FIXATION (ORIF) DISTAL RADIAL FRACTURE;  Surgeon: Milly Jakob, MD;  Location: [place] [facility];  Service: Orthopedics;  Laterality: Right;   POLYPECTOMY  11/26/2018   Procedure: POLYPECTOMY;  Surgeon: Gatha Mayer, MD;  Location: [facility];  Service: Endoscopy;;   TONSILLECTOMY     Family History:  Family History  Problem Relation Age of Onset   Anxiety disorder Mother    Congestive Heart Failure Mother    Crohn's disease Mother    Colon cancer Mother 4   Arthritis Father    High blood pressure Father    Crohn's disease Father    Family Psychiatric  History: Denies Social History:  Social History   Substance and Sexual Activity  Alcohol Use Not Currently     Social History   Substance and Sexual Activity  Drug Use Not Currently   Types:  Marijuana    Social History   Socioeconomic History   Marital status: Single    Spouse name: Not on file   Number of children: 0   Years of education: Not on file   Highest education level: Not on file  Occupational History   Not on file  Tobacco Use   Smoking status: Former    Types: Cigarettes    Quit date: 2014    Years since quitting: 9.1   Smokeless tobacco: Never  Vaping Use   Vaping Use: Never used  Substance and Sexual Activity   Alcohol use: Not Currently   Drug use: Not Currently    Types: Marijuana   Sexual activity: Not Currently  Other Topics Concern   Not on file  Social History Narrative   HSG. Long - term disability - unable to work. Lived with his mother in her house - she died  2023/05/03 -    Lives in apartment   Has case worker   Social Determinants of Radio broadcast assistant Strain: Not on file  Food Insecurity: Not on file  Transportation Needs: Not on file  Physical Activity: Not on file  Stress: Not on file  Social Connections: Not on file   Additional Social History:    Allergies:   Allergies  Allergen Reactions   Nsaids Other (See Comments)    GI upset- history of peptic ulcers!!   Diphenhydramine Hcl Other (See Comments)    Restlessness   Flexeril [Cyclobenzaprine] Other (See Comments)    Restlessness   Fluoxetine Other (See Comments)    Made the patient feel "worse than before" (treatment)   Hctz [Hydrochlorothiazide] Other (See Comments)    Caused to lose sodium when taking with Lisinopril   Rexulti [Brexpiprazole] Other (See Comments)    "Made me not feel right- restless"   Seroquel [Quetiapine] Other (See Comments)    Restless legs and makes the patient sweat- also does not help patient's insomnia. Pt takes this at home in 2023.   Gabapentin Diarrhea   Hydroxyzine Other (See Comments)    Restlessness    Labs:  No results found for this or any previous visit (from the past 48 hour(s)).     Current Facility-Administered Medications  Medication Dose Route Frequency Provider Last Rate Last Admin   0.9 %  sodium chloride infusion   Intravenous PRN Dwyane Dee, MD 10 mL/hr at 07/23/21 0600 Infusion Verify at 07/23/21 0600   acetaminophen (TYLENOL) tablet 650 mg  650 mg Oral Q4H PRN Dwyane Dee, MD   650 mg at 08/11/21 1206   alum & mag hydroxide-simeth (MAALOX/MYLANTA) 200-200-20 MG/5ML suspension 30 mL  30 mL Oral Q4H PRN Dwyane Dee, MD   30 mL at 07/31/21 1053   ARIPiprazole (ABILIFY) tablet 2 mg  2 mg Oral QHS Suella Broad, FNP   2 mg at 08/10/21 2200   clonazePAM (KLONOPIN) tablet 0.25 mg  0.25 mg Oral q morning Starkes-Perry, Gayland Curry, FNP       And   clonazePAM (KLONOPIN) tablet 1 mg  1 mg Oral QHS  Starkes-Perry, Gayland Curry, FNP       enoxaparin (LOVENOX) injection 40 mg  40 mg Subcutaneous Q24H Dwyane Dee, MD   40 mg at 08/10/21 2200   feeding supplement (ENSURE ENLIVE / ENSURE PLUS) liquid 237 mL  237 mL Oral TID BM Lavina Hamman, MD   237 mL at 08/11/21 0911   lisinopril (ZESTRIL) tablet 30 mg  30  mg Oral Daily Dwyane Dee, MD   30 mg at 08/11/21 5643   loperamide (IMODIUM) capsule 2 mg  2 mg Oral PRN Samuella Cota, MD   2 mg at 08/11/21 3295   loratadine (CLARITIN) tablet 10 mg  10 mg Oral Daily Manuella Ghazi, Pratik D, DO   10 mg at 08/11/21 1884   MEDLINE mouth rinse  15 mL Mouth Rinse BID Dwyane Dee, MD   15 mL at 08/11/21 0912   mirtazapine (REMERON) tablet 15 mg  15 mg Oral QHS Suella Broad, FNP       naphazoline-pheniramine (NAPHCON-A) 0.025-0.3 % ophthalmic solution 1 drop  1 drop Both Eyes QID PRN Manuella Ghazi, Pratik D, DO   1 drop at 07/19/21 1750   oxyCODONE (Oxy IR/ROXICODONE) immediate release tablet 5 mg  5 mg Oral Q12H PRN Manuella Ghazi, Pratik D, DO   5 mg at 08/11/21 1023   pantoprazole (PROTONIX) EC tablet 40 mg  40 mg Oral BID Lavina Hamman, MD   40 mg at 08/11/21 1660   polyvinyl alcohol (LIQUIFILM TEARS) 1.4 % ophthalmic solution 1 drop  1 drop Both Eyes PRN Jennye Boroughs, MD   1 drop at 07/17/21 2106   potassium chloride SA (KLOR-CON M) CR tablet 20 mEq  20 mEq Oral BID Phillips Grout, MD   20 mEq at 08/11/21 0911   propranolol (INDERAL) tablet 20 mg  20 mg Oral TID Mariel Aloe, MD   20 mg at 08/11/21 6301   saccharomyces boulardii (FLORASTOR) capsule 250 mg  250 mg Oral BID Lavina Hamman, MD   250 mg at 08/11/21 0912   simethicone (MYLICON) chewable tablet 80 mg  80 mg Oral QID Lavina Hamman, MD   80 mg at 08/11/21 0913   sodium chloride (OCEAN) 0.65 % nasal spray 1 spray  1 spray Each Nare PRN Dwyane Dee, MD   1 spray at 07/25/21 0920   sucralfate (CARAFATE) tablet 1 g  1 g Oral QID Dwyane Dee, MD   1 g at 08/11/21 0911   zolpidem (AMBIEN) tablet  5 mg  5 mg Oral Standley Brooking, MD   5 mg at 08/10/21 2200    Musculoskeletal: Strength & Muscle Tone: within normal limits Gait & Station: normal Patient leans: N/A     Psychiatric Specialty Exam:  Presentation  General Appearance: Appropriate for Environment; Casual  Eye Contact:Fair  Speech:Clear and Coherent; Normal Rate  Speech Volume:Normal  Handedness:Right   Mood and Affect  Mood:Anxious  Affect:Appropriate; Congruent   Thought Process  Thought Processes:Coherent; Linear  Descriptions of Associations:Intact  Orientation:Full (Time, Place and Person)  Thought Content:Logical  History of Schizophrenia/Schizoaffective disorder:No  Duration of Psychotic Symptoms:N/A  Hallucinations:No data recorded  Ideas of Reference:None  Suicidal Thoughts:No data recorded  Homicidal Thoughts:No data recorded   Sensorium  Memory:Immediate Good; Recent Good; Remote Good  Judgment:Poor  Insight:Poor   Executive Functions  Concentration:Fair  Attention Span:Fair  Recall:Poor  Fund of Knowledge:Poor  Language:Poor   Psychomotor Activity  Psychomotor Activity:No data recorded   Assets  Assets:Communication Skills; Financial Resources/Insurance; Physical Health; Resilience; Social Support   Sleep  Sleep:No data recorded   Physical Exam: Physical Exam Vitals and nursing note reviewed.  Constitutional:      Appearance: Normal appearance. He is normal weight.  HENT:     Head: Normocephalic.     Nose: Nose normal.  Eyes:     Pupils: Pupils are equal, round, and reactive to light.  Skin:    General: Skin is warm.  Neurological:     General: No focal deficit present.     Mental Status: He is alert and oriented to person, place, and time. Mental status is at baseline.  Psychiatric:        Mood and Affect: Mood normal.        Behavior: Behavior normal.        Thought Content: Thought content normal.        Judgment: Judgment normal.    Review of Systems  Psychiatric/Behavioral:  Negative for depression, hallucinations, memory loss, substance abuse and suicidal ideas. The patient is nervous/anxious and has insomnia.   All other systems reviewed and are negative. Blood pressure 107/62, pulse 61, temperature (!) 97.4 F (36.3 C), temperature source Oral, resp. rate 18, height 5' 10"  (1.778 m), weight 67 kg, SpO2 97 %. Body mass index is 21.19 kg/m.   Treatment Plan Summary: Daily contact with patient to assess and evaluate symptoms and progress in treatment, Medication management, and Plan     Recommended for inpatient psychiatric treatment.   -Patient is to remain under involuntary commitment at this time.     -Will continue  Abilify 52m mg p.o. nightly to target psychosis, depressive symptoms, anxiety, and suicidal ideations.   -Will reduce Klonopin 0.25 mg p.o. daily every morning; and Klonopin 1 mg p.o. nightly. He understands the goal is to continue with slow modest taper. Last change was 07/20/2021. Patient was allowed to be involved in the decision making.   -Will increase  mirtazapine 15 mg p.o. nightly.   -Continue  230mp.o. 3 times daily.  Insomnia-Patient continues to endorse poor sleep habits recommend documenting sleep hours byt night shift.  -Continue [facility] referral, spoke with LCSW regarding improved mental health and suicidal risk factors being reduced. May consider initial referrals to ALF or skilled nursing. Will continue to work towards disposition for this patient will likely require long term care and supervision.   -Continue to use coping skills to help with behavior modification and anxiety reducing measures.  Disposition: Recommend psychiatric Inpatient admission when medically cleared. IVC  TaSuella BroadFNP 08/11/2021 12:59 PM

## 2021-08-11 NOTE — Progress Notes (Signed)
PROGRESS NOTE Anthony Lamb  KTG:[id number] DOB: 05-03-1956 DOA: 07/01/2021 PCP: Sandi Mariscal, MD   Brief Narrative/Hospital Course: 66 year old man presented after an intentional overdose of duloxetine and admitted on 1/273 for suicidal drug overdose incidental Covid, treate,severe hyponatremia w/ history of SIADH, seen by nephrology and treated with hypertonic saline, it was felt to be from significant inappropriate ADH activation in the setting of duloxetine overdose likely in a patient with underlying mild hyponatremia from EtOH use. Hosp course listed as- admitted 127. On 1/28 started on Molnupiravir for incidental COVID- 1/29 transferred to stepdown unit for 3% hypertonic saline for severe hyponatremia, 2/4 nephrology signed off and 07/12/21 Deemed medically stable to transfer to inpatient psychiatric facility, 07/30/21  PICC line removed. Patient has been medically stable for discharge pending for inpatient psych.     Subjective: One-to-one at bedside No fever overnight BP stable.  Assessment and Plan: * Overdose of antidepressant, intentional self-harm, initial encounter ([place]) Suicidal attempt with overdose of antidepressant Major depression recurrent Sedative,hypnotic or anxiolytic induced anxiety disorder: Presented with intentional overdose of duloxetine.Patient currently on multiple psychotropic medication, chronic benzo and multiple suicide attempts of high lethality in the past.Followed by psychiatry, unable to contract for safety,remains on IVC. Has a longstanding history of chronic benzodiazepine use likely secondary to alcohol use disorder. Psych recommended to start patient on a benzodiazepine taper and are managing it-they have notified all parties involved in prescribing the med to pt.  Cont with current Abilify 2 mg qhs,Klonopin 0.5 daily,1 mg qhs,Remeron 7.5 mg qhs and inderal 20 mg Tid-for tremors,on  Ambien PRN. Continue one-to-one sitter.Remains medically stable, waiting  for inpatient psych bed.  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  Gastroesophageal reflux disease Continue PPI,carafete, simethicone, probiotics.   Tremor Continue Inderal as above.  Alcohol abuse Noted.  Has been here over a month no risk of withdrawal  Ankylosing spondylitis ([place]) Seen on x ray, known past history.   SIADH (syndrome of inappropriate ADH production) (HCC) Due to duloxetine overdose, managed with hypertonic saline now sodium is stable and improved.  Nephrology signed off  Essential hypertension BP well controlled, on lisinopril 30 mg and also getting Inderal for tremors   Chest pain-resolved as of 08/04/2021 Not typical. Multiple EKG normal. No further evaluation.  Allergic reaction-resolved as of 08/04/2021 unclear etiology. Occurred on 2/3. Resolved with Benadryl, Pepcid and Solu-medrol.  COVID-19 virus infection-resolved as of 08/04/2021 --Diagnosed on 07/01/21. Incidental. Treated with molnupiravir. Isolation discontinued.  DVT prophylaxis: enoxaparin (LOVENOX) injection 40 mg Start: 07/01/21 2200 SCDs Start: 07/01/21 1108 Code Status:   Code Status: Full Code Family Communication: plan of care discussed with patient/RN at bedside.  Disposition: Currently is medically stable for discharge. Status is: Inpatient Remains inpatient appropriate because: Unsafe disposition awaiting for inpatient psych Objective: Vitals last 24 hrs: Vitals:   08/10/21 1347 08/10/21 2159 08/11/21 0300 08/11/21 0800  BP: (!) 110/57 126/72 107/62   Pulse: (!) 59 62 61   Resp: 18 18 18    Temp: 97.9 F (36.6 C) (!) 97.3 F (36.3 C) (!) 97.4 F (36.3 C)   TempSrc: Oral Oral Oral   SpO2: 99% 96% 97%   Weight:    67 kg  Height:       Weight change:   Physical Examination: General exam: AA at  baseline, older than stated age, weak appearing.  HEENT:Oral mucosa moist, Ear/Nose WNL grossly, dentition normal. Respiratory system: bilaterally diminished, no use of accessory muscle Cardiovascular system: S1 & S2 +, No JVD,. Gastrointestinal system: Abdomen soft,NT,ND,BS+ Nervous System:Alert, awake, moving extremities and grossly nonfocal Extremities: LE ankle edema none, distal peripheral pulses palpable.  Skin: No rashes,no icterus. MSK: Normal muscle bulk,tone, power   Medications reviewed:  Scheduled Meds:  ARIPiprazole  2 mg Oral QHS   clonazePAM  0.5 mg Oral Q breakfast   And   clonazePAM  1 mg Oral QHS   enoxaparin (LOVENOX) injection  40 mg Subcutaneous Q24H   feeding supplement  237 mL Oral TID BM   lisinopril  30 mg Oral Daily   loratadine  10 mg Oral Daily   mouth rinse  15 mL Mouth Rinse BID   mirtazapine  7.5 mg Oral QHS   pantoprazole  40 mg Oral BID   potassium chloride  20 mEq Oral BID   propranolol  20 mg Oral TID   saccharomyces boulardii  250 mg Oral BID   simethicone  80 mg Oral QID   sucralfate  1 g Oral QID   zolpidem  5 mg Oral QHS   Continuous Infusions:  sodium chloride 10 mL/hr at 07/23/21 0600      Diet Order             Diet regular Room service appropriate? Yes; Fluid consistency: Thin  Diet effective now                            Intake/Output Summary (Last 24 hours) at 08/11/2021 1032 Last data filed at 08/11/2021 0900 Gross per 24 hour  Intake 1800 ml  Output --  Net 1800 ml   Net IO Since Admission: 22,484.03 mL [08/11/21 1032]  Wt Readings from Last 3 Encounters:  08/11/21 67 kg  06/29/21 63.5 kg  06/22/21 65.8 kg     Unresulted Labs (From admission, onward)    None     Data Reviewed: I have personally reviewed following labs and imaging studies CBC: No results for input(s): WBC, NEUTROABS, HGB, HCT, MCV, PLT in the last 168 hours. Basic Metabolic Panel: Recent Labs  Lab 08/09/21 0532  NA 130*  K 4.4   CL 95*  CO2 29  GLUCOSE 91  BUN 13  CREATININE 0.64  CALCIUM 8.8*   GFR: Estimated Creatinine Clearance: 87.2 mL/min (by C-G formula based on SCr of 0.64 mg/dL). Liver Function Tests: No results for input(s): AST, ALT, ALKPHOS, BILITOT, PROT, ALBUMIN in the last 168 hours. No results for input(s): LIPASE, AMYLASE in the last 168 hours. No results for input(s): AMMONIA in the last 168 hours. Coagulation Profile: No results for input(s): INR, PROTIME in the last 168 hours. Cardiac Enzymes: No results for input(s): CKTOTAL, CKMB, CKMBINDEX, TROPONINI in the last 168 hours. BNP (last 3 results) No results for input(s): PROBNP in the last 8760 hours. HbA1C: No results for input(s): HGBA1C in the last 72 hours. CBG: No results for input(s): GLUCAP in the last 168 hours. Lipid Profile: No results for input(s): CHOL, HDL, LDLCALC, TRIG, CHOLHDL, LDLDIRECT in the last 72 hours. Thyroid Function Tests: No results for input(s): TSH, T4TOTAL, FREET4, T3FREE, THYROIDAB in the last 72 hours. Anemia Panel: No results for input(s): VITAMINB12, FOLATE, FERRITIN, TIBC, IRON, RETICCTPCT in the last 72 hours. Sepsis Labs: No results for input(s): PROCALCITON, LATICACIDVEN in the last 168 hours.  No results found for this or  any previous visit (from the past 240 hour(s)).  Antimicrobials: Anti-infectives (From admission, onward)    Start     Dose/Rate Route Frequency Ordered Stop   07/01/21 2200  nirmatrelvir/ritonavir EUA (PAXLOVID) 3 tablet  Status:  Discontinued        3 tablet Oral 2 times daily 07/01/21 1900 07/01/21 1935   07/01/21 2200  molnupiravir EUA (LAGEVRIO) capsule 800 mg        4 capsule Oral 2 times daily 07/01/21 1935 07/06/21 2159      Culture/Microbiology    Component Value Date/Time   SDES  06/04/2021 1014    URINE, CLEAN CATCH Performed at [facility], [address], [place], [place] [address]    [facility]  06/04/2021 1014     NONE Performed at [facility], [address], [place], [place] [address]    CULT  06/04/2021 1014    NO GROWTH Performed at [facility] [address] [place], [place] [address]    REPTSTATUS 06/05/2021 FINAL 06/04/2021 1014    Other culture-see note  Radiology Studies: No results found.   LOS: 40 days   Antonieta Pert, MD [facility]  08/11/2021, 10:32 AM

## 2021-08-12 NOTE — Consult Note (Signed)
Patient with marked improvement during his hospitalization, currently at the stay of 42 days.  Patient has been deemed psychiatrically stable at this time, no longer meets requirement for inpatient psychiatric facility.  Patient denies any suicidal intent for the past 6 weeks, he is also able to contract for safety. ? ?-Will recommend referral to assisted living facility, as patient will require supervision as he is not able to care for self in a normal home environment. ?-Psychiatry will continue to follow during the duration of this hospitalization. ?-IVC can be rescinded. ?-Suicide safety sitter can be discontinued at this time. ?

## 2021-08-12 NOTE — Progress Notes (Signed)
PROGRESS NOTE Anthony Lamb  BSW:[id number] DOB: 1955-12-15 DOA: 07/01/2021 PCP: Sandi Mariscal, MD   Brief Narrative/Hospital Course: 66 year old man presented after an intentional overdose of duloxetine and admitted on 1/273 for suicidal drug overdose incidental Covid, treate,severe hyponatremia w/ history of SIADH, seen by nephrology and treated with hypertonic saline, it was felt to be from significant inappropriate ADH activation in the setting of duloxetine overdose likely in a patient with underlying mild hyponatremia from EtOH use. Hosp course listed as- admitted 127. On 1/28 started on Molnupiravir for incidental COVID- 1/29 transferred to stepdown unit for 3% hypertonic saline for severe hyponatremia, 2/4 nephrology signed off and 07/12/21 Deemed medically stable to transfer to inpatient psychiatric facility, 07/30/21  PICC line removed. Patient has been medically stable for discharge pending for inpatient psych.     Subjective: Seen and examined this morning.  Patient reports she was somewhat dizzy on ambulation but vitals are stable Educated on how to get up.Overnight no fever, sitter remains in place No change in plan of care Patient not able to care for self and home environment  Assessment and Plan: * Overdose of antidepressant, intentional self-harm, initial encounter ([place]) Suicidal attempt with overdose of antidepressant Major depression recurrent Sedative,hypnotic or anxiolytic induced anxiety disorder: Presented with intentional overdose of duloxetine.Patient currently on multiple psychotropic medication, chronic benzo and multiple suicide attempts of high lethality in the past.Followed by psychiatry, unable to contract for safety,remains on IVC. Has a longstanding history of chronic benzodiazepine use likely secondary to alcohol use disorder. Psych recommended to start patient on a benzodiazepine taper and are managing it-they have notified all parties involved in prescribing  the med to pt.  Cont with current Abilify 2 mg qhs,Klonopin 0.5 daily,1 mg qhs,Remeron 7.5 mg qhs and inderal 20 mg Tid-for tremors,on  Ambien PRN. Continue one-to-one sitter.Remains medically stable, waiting for inpatient psych bed as per the psychiatry.  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  Gastroesophageal reflux disease Continue PPI,carafete, simethicone, probiotics.   Tremor Continue Inderal as above.  Alcohol abuse Noted.  Has been here over a month no risk of withdrawal  Ankylosing spondylitis ([place]) Seen on x ray, known past history.   SIADH (syndrome of inappropriate ADH production) ([facility]) Due to duloxetine overdose, managed with hypertonic saline now sodium is stable and improved.  Nephrology signed off  Essential hypertension BP well controlled, on lisinopril 30 mg and also getting Inderal for tremors   Chest pain-resolved as of 08/04/2021 Not typical. Multiple EKG normal. No further evaluation.  Allergic reaction-resolved as of 08/04/2021 unclear etiology. Occurred on 2/3. Resolved with Benadryl, Pepcid and Solu-medrol.  COVID-19 virus infection-resolved as of 08/04/2021 --Diagnosed on 07/01/21. Incidental. Treated with molnupiravir. Isolation discontinued.  DVT prophylaxis: enoxaparin (LOVENOX) injection 40 mg Start: 07/01/21 2200 SCDs Start: 07/01/21 1108 Code Status:   Code Status: Full Code Family Communication: plan of care discussed with patient/RN at bedside.  Disposition: Currently is medically stable for discharge. Status is: Inpatient Remains inpatient appropriate because: Unsafe disposition awaiting for inpatient psych-?SNF (if psych clears) Objective: Vitals last 24 hrs: Vitals:   08/11/21 0300 08/11/21 0800 08/11/21 2000 08/12/21 0920  BP: 107/62  129/65 (!) 143/61  Pulse:  61  67 78  Resp: 18  18 20   Temp: (!) 97.4 F (36.3 C)  97.8 F (36.6 C) 98.1 F (36.7 C)  TempSrc: Oral  Oral Axillary  SpO2: 97%  99% 100%  Weight:  67 kg    Height:       Weight change: 1.8 kg  Physical Examination: General exam: AA,older than stated age, weak appearing. HEENT:Oral mucosa moist, Ear/Nose WNL grossly, dentition normal. Respiratory system: bilaterally diminished,no use of accessory muscle Cardiovascular system: S1 & S2 +, No JVD,. Gastrointestinal system: Abdomen soft,NT,ND, BS+ Nervous System:Alert, awake, moving extremities and grossly nonfocal Extremities: edema neg,distal peripheral pulses palpable.  Skin: No rashes,no icterus. MSK: Normal muscle bulk,tone, power  Medications reviewed:  Scheduled Meds:  ARIPiprazole  2 mg Oral QHS   clonazePAM  0.25 mg Oral q morning   And   clonazePAM  1 mg Oral QHS   enoxaparin (LOVENOX) injection  40 mg Subcutaneous Q24H   feeding supplement  237 mL Oral TID BM   lisinopril  30 mg Oral Daily   loratadine  10 mg Oral Daily   mouth rinse  15 mL Mouth Rinse BID   mirtazapine  15 mg Oral QHS   pantoprazole  40 mg Oral BID   potassium chloride  20 mEq Oral BID   propranolol  20 mg Oral TID   saccharomyces boulardii  250 mg Oral BID   simethicone  80 mg Oral QID   sucralfate  1 g Oral QID   zolpidem  5 mg Oral QHS   Continuous Infusions:  sodium chloride 10 mL/hr at 07/23/21 0600      Diet Order             DIET SOFT Room service appropriate? Yes; Fluid consistency: Thin  Diet effective now                            Intake/Output Summary (Last 24 hours) at 08/12/2021 1119 Last data filed at 08/12/2021 0902 Gross per 24 hour  Intake 717 ml  Output --  Net 717 ml   Net IO Since Admission: 23,201.03 mL [08/12/21 1119]  Wt Readings from Last 3 Encounters:  08/11/21 67 kg  06/29/21 63.5 kg  06/22/21 65.8 kg     Unresulted Labs (From admission, onward)    None     Data Reviewed: I have  personally reviewed following labs and imaging studies CBC: No results for input(s): WBC, NEUTROABS, HGB, HCT, MCV, PLT in the last 168 hours. Basic Metabolic Panel: Recent Labs  Lab 08/09/21 0532  NA 130*  K 4.4  CL 95*  CO2 29  GLUCOSE 91  BUN 13  CREATININE 0.64  CALCIUM 8.8*   GFR: Estimated Creatinine Clearance: 87.2 mL/min (by C-G formula based on SCr of 0.64 mg/dL). Liver Function Tests: No results for input(s): AST, ALT, ALKPHOS, BILITOT, PROT, ALBUMIN in the last 168 hours. No results for input(s): LIPASE, AMYLASE in the last 168 hours. No results for input(s): AMMONIA in the last 168 hours. Coagulation Profile: No results for input(s): INR, PROTIME in the last 168 hours. Cardiac Enzymes: No results for input(s): CKTOTAL, CKMB, CKMBINDEX, TROPONINI in the last 168 hours. BNP (last 3 results) No results for input(s): PROBNP in the last 8760 hours. HbA1C: No results for input(s): HGBA1C in the last 72 hours. CBG: No results for input(s): GLUCAP in the last 168 hours. Lipid Profile: No results for input(s): CHOL, HDL, LDLCALC, TRIG, CHOLHDL, LDLDIRECT in the last 72 hours. Thyroid Function Tests: No results for input(s): TSH, T4TOTAL, FREET4, T3FREE, THYROIDAB in the last 72 hours.  Anemia Panel: No results for input(s): VITAMINB12, FOLATE, FERRITIN, TIBC, IRON, RETICCTPCT in the last 72 hours. Sepsis Labs: No results for input(s): PROCALCITON, LATICACIDVEN in the last 168 hours.  No results found for this or any previous visit (from the past 240 hour(s)).  Antimicrobials: Anti-infectives (From admission, onward)    Start     Dose/Rate Route Frequency Ordered Stop   07/01/21 2200  nirmatrelvir/ritonavir EUA (PAXLOVID) 3 tablet  Status:  Discontinued        3 tablet Oral 2 times daily 07/01/21 1900 07/01/21 1935   07/01/21 2200  molnupiravir EUA (LAGEVRIO) capsule 800 mg        4 capsule Oral 2 times daily 07/01/21 1935 07/06/21 2159       Culture/Microbiology    Component Value Date/Time   SDES  06/04/2021 1014    URINE, CLEAN CATCH Performed at [facility], [address] [place], [place] [address]    [facility]  06/04/2021 1014    NONE Performed at [facility], [address] [place], [place] [address]    CULT  06/04/2021 1014    NO GROWTH Performed at [facility] [address], [place], [place] [address]    REPTSTATUS 06/05/2021 FINAL 06/04/2021 1014    Other culture-see note  Radiology Studies: No results found.   LOS: 62 days   Antonieta Pert, MD [facility]  08/12/2021, 11:19 AM

## 2021-08-13 LAB — BASIC METABOLIC PANEL
Anion gap: 7 (ref 5–15)
BUN: 14 mg/dL (ref 8–23)
CO2: 29 mmol/L (ref 22–32)
Calcium: 8.5 mg/dL — ABNORMAL LOW (ref 8.9–10.3)
Chloride: 92 mmol/L — ABNORMAL LOW (ref 98–111)
Creatinine, Ser: 0.65 mg/dL (ref 0.61–1.24)
GFR, Estimated: >60 mL/min (ref >60)
Glucose, Bld: 81 mg/dL (ref 70–99)
Potassium: 4.3 mmol/L (ref 3.5–5.1)
Sodium: 128 mmol/L — ABNORMAL LOW (ref 135–145)

## 2021-08-13 MED ORDER — FLUTICASONE PROPIONATE 50 MCG/ACT NA SUSP
1.0000 | Freq: Every day | NASAL | Status: DC
  Administered 2021-08-13 – 2021-08-22 (×10): 1 via NASAL
  Filled 2021-08-13: qty 16

## 2021-08-13 NOTE — Progress Notes (Signed)
?PROGRESS NOTE ?Anthony Lamb  PIR:[id number] DOB: 07/01/1955 DOA: 07/01/2021 ?PCP: Sandi Mariscal, MD  ? ?Brief Narrative/Hospital Course: ?66 year old man presented after an intentional overdose of duloxetine and admitted on 1/273 for suicidal drug overdose incidental Covid, treate,severe hyponatremia w/ history of SIADH, seen by nephrology and treated with hypertonic saline, it was felt to be from significant inappropriate ADH activation in the setting of duloxetine overdose likely in a patient with underlying mild hyponatremia from EtOH use. ?Hosp course listed as- admitted 127. On 1/28 started on Molnupiravir for incidental COVID- 1/29 transferred to stepdown unit for 3% hypertonic saline for severe hyponatremia, 2/4 nephrology signed off and 07/12/21 Deemed medically stable to transfer to inpatient psychiatric facility, 07/30/21  PICC line removed.Patient has been medically stable for discharge pending for inpatient psych. ?With further follow-up by psychiatry they have deemed IVC can be rescinded 08/12/21 and referred patient for assisted living. ?  ?Subjective: ?Seen and examined.  Reports he may be getting sinus infection and reports being being very weak and needing a lot of help, and reports unable to take care of himself. ? ?Assessment and Plan: ?* Overdose of antidepressant, intentional self-harm, initial encounter ([place]) ?Suicidal attempt with overdose of antidepressant ?Major depression recurrent ?Sedative,hypnotic or anxiolytic induced anxiety disorder: ?Presented with intentional overdose of duloxetine.Patient currently on multiple psychotropic medication, chronic benzo and multiple suicide attempts of high lethality in the past.Followed by psychiatry, unable to contract for safety,remains on IVC. ?Has a longstanding history of chronic benzodiazepine use likely secondary to alcohol use disorder.Psych recommended to start patient on a benzodiazepine taper and are managing it-they have notified all parties  involved in prescribing the med to pt.  ?Cont with current Abilify 2 mg qhs,Klonopin 0.5 daily,1 mg qhs,Remeron 7.5 mg qhs and inderal 20 mg Tid-for tremors,on  Ambien PRN.Defer to psych regarding IVC rescind.Cont PT/OT-will need ALF,unable to do self care It seems. ? ?Suicide attempt [facility]) ?See above ? 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 ?Major depression, recurrent ([place]) ?See above continue meds as above. ? ?Gastroesophageal reflux disease ?Continue PPI,carafete, simethicone, probiotics.  ? ?Tremor ?Continue Inderal as above. ? ?Alcohol abuse ?Noted.  Has been here over a month no risk of withdrawal ? ?Ankylosing spondylitis ([place]) ?Seen on x ray, known past history.  ? ?SIADH (syndrome of inappropriate ADH production) ([place]) ?Due to duloxetine overdose, managed with hypertonic saline now sodium is stable and improved.  Nephrology signed off.  Follow-up repeat BMP today ? ?Essential hypertension ?BP well controlled, on lisinopril 30 mg and also getting Inderal for tremors  ? ?Chest pain-resolved as of 08/04/2021 ?Not typical. Multiple EKG normal. No further evaluation. ? ?Allergic reaction-resolved as of 08/04/2021 ?unclear etiology. Occurred on 2/3. Resolved with Benadryl, Pepcid and Solu-medrol. ? ?COVID-19 virus infection-resolved as of 08/04/2021 ?Diagnosed on 07/01/21. Incidental. Treated with molnupiravir. Isolation discontinued. ? ?DVT prophylaxis: enoxaparin (LOVENOX) injection 40 mg Start: 07/01/21 2200 ?SCDs Start: 07/01/21 1108 ?Code Status:   Code Status: Full Code ?Family Communication: plan of care discussed with patient/RN at bedside. ? ?Disposition: Currently is medically stable for discharge. ?Status is: Inpatient ?Remains inpatient appropriate because: Unsafe disposition awaiting for inpatient psych-?SNF (if psych clears) ?Objective: ?Vitals last 24 hrs: ?Vitals:  ? 08/12/21 1403 08/12/21 2114 08/13/21 0619 08/13/21 0800  ?BP:  133/65 120/63 115/64   ?Pulse: 84 64 (!) 59   ?Resp: 16 18 17    ?Temp: 97.9 ?F (36.6 ?C) 97.9 ?F (36.6 ?C) 97.8 ?F (  36.6 ?C)   ?TempSrc: Oral Oral Oral   ?SpO2: 100% 100% 98%   ?Weight:    68.2 kg  ?Height:      ? ?Weight change: 1.2 kg ? ?Physical Examination: ?General exam: AA oriented, appearsolder than stated age, weak appearing. ?HEENT:Oral mucosa moist, Ear/Nose WNL grossly, dentition normal. ?Respiratory system: bilaterally diminished,no use of accessory muscle ?Cardiovascular system: S1 & S2 +, No JVD,. ?Gastrointestinal system: Abdomen soft,NT,ND, BS+ ?Nervous System:Alert, awake, moving extremities and grossly nonfocal ?Extremities: edema neg,distal peripheral pulses palpable.  ?Skin: No rashes,no icterus. ?MSK: Normal muscle bulk,tone, power ? ?Medications reviewed:  ?Scheduled Meds: ? ARIPiprazole  2 mg Oral QHS  ? clonazePAM  0.25 mg Oral q morning  ? And  ? clonazePAM  1 mg Oral QHS  ? enoxaparin (LOVENOX) injection  40 mg Subcutaneous Q24H  ? feeding supplement  237 mL Oral TID BM  ? fluticasone  1 spray Each Nare Daily  ? lisinopril  30 mg Oral Daily  ? loratadine  10 mg Oral Daily  ? mouth rinse  15 mL Mouth Rinse BID  ? mirtazapine  15 mg Oral QHS  ? pantoprazole  40 mg Oral BID  ? potassium chloride  20 mEq Oral BID  ? propranolol  20 mg Oral TID  ? saccharomyces boulardii  250 mg Oral BID  ? simethicone  80 mg Oral QID  ? sucralfate  1 g Oral QID  ? zolpidem  5 mg Oral QHS  ? ?Continuous Infusions: ? sodium chloride 10 mL/hr at 07/23/21 0600  ?Data Reviewed: I have personally reviewed following labs and imaging studies ?CBC: ?No results for input(s): WBC, NEUTROABS, HGB, HCT, MCV, PLT in the last 168 hours. ?Basic Metabolic Panel: ?Recent Labs  ?Lab 08/09/21 ?0532  ?NA 130*  ?K 4.4  ?CL 95*  ?CO2 29  ?GLUCOSE 91  ?BUN 13  ?CREATININE 0.64  ?CALCIUM 8.8*  ?Antimicrobials: ?Anti-infectives (From admission, onward)  ? ? Start     Dose/Rate Route Frequency Ordered Stop  ? 07/01/21 2200   nirmatrelvir/ritonavir EUA (PAXLOVID) 3 tablet  Status:  Discontinued       ? 3 tablet Oral 2 times daily 07/01/21 1900 07/01/21 1935  ? 07/01/21 2200  molnupiravir EUA (LAGEVRIO) capsule 800 mg       ? 4 capsule Oral 2 times daily 07/01/21 1935 07/06/21 2159  ? ?  ? ?Culture/Microbiology ?   ?Component Value Date/Time  ? SDES  06/04/2021 1014  ?  URINE, CLEAN CATCH ?Performed at [facility], [address], [place], [place] [address] ?  ? SPECREQUEST  06/04/2021 1014  ?  NONE ?Performed at [facility], [address] [place], [place] [address] ?  ? CULT  06/04/2021 1014  ?  NO GROWTH ?Performed at [facility], [place] [address] [place], [place] [address] ?  ? REPTSTATUS 06/05/2021 FINAL 06/04/2021 1014  ?  ?Other culture-see note  ?Radiology Studies: ?No results found. ? ? LOS: 42 days  ? ?Antonieta Pert, MD ?[facility] ? ?08/13/2021, 10:35 AM  ?  ?

## 2021-08-14 NOTE — TOC Progression Note (Signed)
Transition of Care (TOC) - Progression Note  ? ? ?Patient Details  ?Name: Anthony Lamb ?MRN: [id number] ?Date of Birth: 1955/10/22 ? ?Transition of Care (TOC) CM/SW Contact  ?Ross Ludwig, LCSW ?Phone Number: ?08/14/2021, 11:32 AM ? ?Clinical Narrative:    ? ?Patient's IVC has been rescinded.  CSW faxed required paperwork to [facility] office. ? ? ?  ?  ? ?Expected Discharge Plan and Services ?  ?  ?  ?  ?  ?                ?  ?  ?  ?  ?  ?  ?  ?  ?  ?  ? ? ?Social Determinants of Health (SDOH) Interventions ?  ? ?Readmission Risk Interventions ?No flowsheet data found. ? ?

## 2021-08-14 NOTE — Progress Notes (Signed)
?PROGRESS NOTE ?Anthony Lamb  OXB:[id number] DOB: February 12, 1956 DOA: 07/01/2021 ?PCP: Sandi Mariscal, MD  ? ?Brief Narrative/Hospital Course: ?66 year old man presented after an intentional overdose of duloxetine and admitted on 1/273 for suicidal drug overdose incidental Covid, treate,severe hyponatremia w/ history of SIADH, seen by nephrology and treated with hypertonic saline, it was felt to be from significant inappropriate ADH activation in the setting of duloxetine overdose likely in a patient with underlying mild hyponatremia from EtOH use. ?Hosp course listed as- admitted 127. On 1/28 started on Molnupiravir for incidental COVID- 1/29 transferred to stepdown unit for 3% hypertonic saline for severe hyponatremia, 2/4 nephrology signed off and 07/12/21 Deemed medically stable to transfer to inpatient psychiatric facility, 07/30/21  PICC line removed.Patient has been medically stable for discharge pending for inpatient psych. ?With further follow-up by psychiatry they have deemed IVC can be rescinded 08/12/21 and referred patient for assisted living. ?  ?Subjective: ?Seen this morning.  He has multiple complaints of pain all over appears anxious asking to go up on his Klonopin. ?He was ambulatory using the restroom. ?Overnight blood pressure stable ?No fever. ? ?Assessment and Plan: ?* Overdose of antidepressant, intentional self-harm, initial encounter ([place]) ?Suicidal attempt with overdose of antidepressant ?Major depression recurrent ?[place] or anxiolytic induced anxiety disorder: ?Presented with intentional overdose of duloxetine-now is stable cleared by psych.On chronic benzo and multiple suicide attempts of high lethality in the past.Initially on IVC.Psych recommended to start patient on a benzodiazepine taper and are managing it-they have notified all parties involved in prescribing the med to pt.Cont with current Abilify 2 mg qhs,Klonopin 0.5 daily,1 mg qhs,Remeron 7.5 mg qhs and inderal 20 mg Tid-for  tremors,on  Ambien PRN.Defer to psych regarding IVC rescind.Cont PT/OT-will need ALF,unable to do self care It seems. ? ?Suicide attempt [facility]) ?See above ? 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 ?Major depression, recurrent ([place]) ?See above continue meds as above. ? ?Gastroesophageal reflux disease ?Continue PPI,carafete, simethicone, probiotics.  ? ?Tremor ?Continue Inderal as above. ? ?Alcohol abuse ?Noted.  Has been here over a month no risk of withdrawal ? ?Ankylosing spondylitis ([place]) ?Seen on x ray, known past history.  ? ?SIADH (syndrome of inappropriate ADH production) ([place]) ?Due to duloxetine overdose, managed with hypertonic saline now sodium is stable and improved fluctuating 128-130.  BMP in 1 week.  Nephrology signed off. ?Recent Labs  ?Lab 08/09/21 ?0532 08/13/21 ?1047  ?NA 130* 128*  ? ? ?Essential hypertension ?BP well controlled, on lisinopril 30 mg and also getting Inderal for tremors  ? ?Chest pain-resolved as of 08/04/2021 ?Not typical. Multiple EKG normal. No further evaluation. ? ?Allergic reaction-resolved as of 08/04/2021 ?unclear etiology. Occurred on 2/3. Resolved with Benadryl, Pepcid and Solu-medrol. ? ?COVID-19 virus infection-resolved as of 08/04/2021 ?Diagnosed on 07/01/21. Incidental. Treated with molnupiravir. Isolation discontinued. ? ?DVT prophylaxis: enoxaparin (LOVENOX) injection 40 mg Start: 07/01/21 2200 ?SCDs Start: 07/01/21 1108 ?Code Status:   Code Status: Full Code ?Family Communication: plan of care discussed with patient/RN at bedside. ? ?Disposition: Currently is medically stable for discharge. ?Status is: Inpatient ?Remains inpatient appropriate because: Cleared by psychiatry advised assisted living facility as patient will require supervision as not able to care for self and normal home environment, psychiatry will continue to follow, psych to rescind IVC and [employer].  ? ?Objective: ?Vitals  last 24 hrs: ?Vitals:  ? 08/13/21 0800 08/13/21 1355 08/13/21 2106 08/14/21 0601  ?BP:  112/62 113/63 120/76  ?Pulse:  62 64 (!) 59  ?Resp:  19 18 15   ?Temp:  98.6 ?F (37 ?C) 97.7 ?F (36.5 ?C) 97.7 ?F (36.5 ?C)  ?TempSrc:   Oral Oral  ?SpO2:  100% 100% 100%  ?Weight: 68.2 kg     ?Height:      ? ?Weight change: 0 kg ? ?Physical Examination: ?General exam: AA0, frail looking, older than stated age, weak appearing. ?HEENT:Oral mucosa moist, Ear/Nose WNL grossly, dentition normal. ?Respiratory system: bilaterally diminished, no use of accessory muscle, tender chest wall bilaterally ?Cardiovascular system: S1 & S2 +, No JVD,. ?Gastrointestinal system: Abdomen soft,NT,ND,BS+ ?Nervous System:Alert, awake, moving extremities and grossly nonfocal ?Extremities: LE ankle edema none, distal peripheral pulses palpable.  ?Skin: No rashes,no icterus. ?MSK: Normal muscle bulk,tone, power  ? ?Medications reviewed:  ?Scheduled Meds: ? ARIPiprazole  2 mg Oral QHS  ? clonazePAM  0.25 mg Oral q morning  ? And  ? clonazePAM  1 mg Oral QHS  ? enoxaparin (LOVENOX) injection  40 mg Subcutaneous Q24H  ? feeding supplement  237 mL Oral TID BM  ? fluticasone  1 spray Each Nare Daily  ? lisinopril  30 mg Oral Daily  ? loratadine  10 mg Oral Daily  ? mouth rinse  15 mL Mouth Rinse BID  ? mirtazapine  15 mg Oral QHS  ? pantoprazole  40 mg Oral BID  ? potassium chloride  20 mEq Oral BID  ? propranolol  20 mg Oral TID  ? saccharomyces boulardii  250 mg Oral BID  ? simethicone  80 mg Oral QID  ? sucralfate  1 g Oral QID  ? zolpidem  5 mg Oral QHS  ? ?Continuous Infusions: ? sodium chloride 10 mL/hr at 07/23/21 0600  ?Data Reviewed: I have personally reviewed following labs and imaging studies ?CBC: ?No results for input(s): WBC, NEUTROABS, HGB, HCT, MCV, PLT in the last 168 hours. ?Basic Metabolic Panel: ?Recent Labs  ?Lab 08/09/21 ?0532 08/13/21 ?1047  ?NA 130* 128*  ?K 4.4 4.3  ?CL 95* 92*  ?CO2 29 29  ?GLUCOSE 91 81  ?BUN 13 14  ?CREATININE 0.64  0.65  ?CALCIUM 8.8* 8.5*  ?Antimicrobials: ?Anti-infectives (From admission, onward)  ? ? Start     Dose/Rate Route Frequency Ordered Stop  ? 07/01/21 2200  nirmatrelvir/ritonavir EUA (PAXLOVID) 3 tablet  Status:  Discontinued       ? 3 tablet Oral 2 times daily 07/01/21 1900 07/01/21 1935  ? 07/01/21 2200  molnupiravir EUA (LAGEVRIO) capsule 800 mg       ? 4 capsule Oral 2 times daily 07/01/21 1935 07/06/21 2159  ? ?  ? ?Culture/Microbiology ?   ?Component Value Date/Time  ? SDES  06/04/2021 1014  ?  URINE, CLEAN CATCH ?Performed at [facility], [address] [place], [place] [address] ?  ? SPECREQUEST  06/04/2021 1014  ?  NONE ?Performed at [facility], [address] [place], [place] [address] ?  ? CULT  06/04/2021 1014  ?  NO GROWTH ?Performed at [facility], [place] [address], [place], [place] [address] ?  ? REPTSTATUS 06/05/2021 FINAL 06/04/2021 1014  ?  ?Other culture-see note  ?Radiology Studies: ?No results found. ? ? LOS: 43 days  ? ?Antonieta Pert, MD ?[facility] ? ?08/14/2021, 11:01 AM  ?  ?

## 2021-08-14 NOTE — Progress Notes (Signed)
Writer arrived to unit and was told in nurse report that this patient would no longer have a sitter. The charge nurse told writer that the [facility] was not going to provide a sitter for the patient, as psychiatry had written in her note of 08/12/21 that pt could have his IVC rescinded and that he no longer needed a sitter.  ?Writer will alert SW/CM regarding the IVC rescind. Paperwork will be sent to [facility] by SW today. ?

## 2021-08-15 MED ORDER — LISINOPRIL 20 MG PO TABS
20.0000 mg | ORAL_TABLET | Freq: Every day | ORAL | Status: DC
  Administered 2021-08-16 – 2021-08-21 (×6): 20 mg via ORAL
  Filled 2021-08-15 (×7): qty 1

## 2021-08-15 MED ORDER — OLANZAPINE 2.5 MG PO TABS
2.5000 mg | ORAL_TABLET | Freq: Every day | ORAL | Status: DC
  Administered 2021-08-15 – 2021-08-17 (×3): 2.5 mg via ORAL
  Filled 2021-08-15 (×3): qty 1

## 2021-08-15 MED ORDER — SODIUM CHLORIDE 0.9 % IV BOLUS
250.0000 mL | Freq: Once | INTRAVENOUS | Status: AC
  Administered 2021-08-15: 250 mL via INTRAVENOUS

## 2021-08-15 NOTE — Progress Notes (Signed)
?PROGRESS NOTE ?Anthony Lamb  ELF:[id number] DOB: 1955/08/29 DOA: 07/01/2021 ?PCP: Sandi Mariscal, MD  ? ?Brief Narrative/Hospital Course: ?66 year old man presented after an intentional overdose of duloxetine and admitted on 1/273 for suicidal drug overdose incidental Covid, treate,severe hyponatremia w/ history of SIADH, seen by nephrology and treated with hypertonic saline, it was felt to be from significant inappropriate ADH activation in the setting of duloxetine overdose likely in a patient with underlying mild hyponatremia from EtOH use. ?Hosp course listed as- admitted 127. On 1/28 started on Molnupiravir for incidental COVID- 1/29 transferred to stepdown unit for 3% hypertonic saline for severe hyponatremia, 2/4 nephrology signed off and 07/12/21 Deemed medically stable to transfer to inpatient psychiatric facility, 07/30/21  PICC line removed.Patient has been medically stable for discharge pending for inpatient psych. ?With further follow-up by psychiatry they have deemed IVC can be rescinded 08/12/21 and referred patient for assisted living. ?  ?Subjective: ?Seen examined this morning reported feeling some dizziness, BP was in 90s. ?No acute events overnight. ?Lisinopril being held. ? ?Assessment and Plan: ?Overdose of antidepressant, intentional self-harm, initial encounter ([place]) ?Suicidal attempt with overdose of antidepressant ?Major depression recurrent ?[place] or anxiolytic induced anxiety disorder: ?Presented with intentional overdose of duloxetine-now is stable cleared by psych.On chronic benzo and multiple suicide attempts of high lethality in the past.Initially on IVC-now rescinded.continue current benzodiazepine as per psych-they have notified all parties involved in prescribing the med to pt. ?Cont with current Abilify 2 mg qhs,Klonopin 0.5 daily,1 mg qhs,Remeron 7.5 mg qhs and inderal 20 mg Tid-for tremors,on  Ambien PRN.Cont PT/OT-will need ALF,unable to do self care. ? 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 ?GERD:Continue PPI,carafete, simethicone, probiotics.  ? ?Tremor:Continue Inderal as above. ? ?Alcohol abuse:Has been here over a month no risk of withdrawal ? ?Ankylosing spondylitis Seen on x ray, known past history.  ? ?Hyponatremia ?SIADH:  ?Due to duloxetine overdose, managed with hypertonic saline initially sodium is stabilized nephrology signed off.  Monitor closely. BMP in 1 week.  ?Recent Labs  ?Lab 08/09/21 ?0532 08/13/21 ?1047  ?NA 130* 128*  ?  ? ?Essential hypertension ?BP soft given small bolus, holding lisinopril and dose decreased 30> 20 MG.  On Inderal for tremors. ? ?Chest pain-resolved as of 08/04/2021 ?Not typical. Multiple EKG normal. No further evaluation. ? ?Allergic reaction-resolved as of 08/04/2021 ?unclear etiology. Occurred on 2/3. Resolved with Benadryl, Pepcid and Solu-medrol. ? ?COVID-19 virus infection-resolved as of 08/04/2021 ?Diagnosed on 07/01/21. Incidental. Treated with molnupiravir. Isolation discontinued. ? ?DVT prophylaxis: enoxaparin (LOVENOX) injection 40 mg Start: 07/01/21 2200 ?SCDs Start: 07/01/21 1108 ?Code Status:   Code Status: Full Code ?Family Communication: plan of care discussed with patient/RN at bedside. ? ?Disposition: Currently is medically stable for discharge. ?Status is: Inpatient ?Remains inpatient appropriate because: Cleared by psychiatry, advised assisted living facility as patient will require supervision as not able to care for self and normal home environment. ? ?Objective: ?Vitals last 24 hrs: ?Vitals:  ? 08/14/21 0601 08/14/21 1351 08/14/21 1939 08/15/21 0434  ?BP: 120/76 (!) 114/56 (!) 107/58 (!) 91/54  ?Pulse: (!) 59 64 73 66  ?Resp: 15 18 18 18   ?Temp: 97.7 ?F (36.5 ?C) (!) 97.5 ?F (36.4 ?C) 97.6 ?F (36.4 ?C) 98 ?F (36.7 ?C)  ?TempSrc: Oral Oral Oral Oral  ?SpO2: 100% 100% 100% 97%  ?Weight:      ?Height:      ? ?Weight change:  ? ?Physical  Examination: ?General  exam: AA, mildly anxious, older than stated age, weak appearing. ?HEENT:Oral mucosa moist, Ear/Nose WNL grossly, dentition normal. ?Respiratory system: bilaterally diminished,no use of accessory muscle ?Cardiovascular system: S1 & S2 +, No JVD,. ?Gastrointestinal system: Abdomen soft,NT,ND, BS+ ?Nervous System:Alert, awake, moving extremities and grossly nonfocal ?Extremities: edema neg,distal peripheral pulses palpable.  ?Skin: No rashes,no icterus. ?MSK: Normal muscle bulk,tone, power ? ? ?Medications reviewed:  ?Scheduled Meds: ? ARIPiprazole  2 mg Oral QHS  ? clonazePAM  0.25 mg Oral q morning  ? And  ? clonazePAM  1 mg Oral QHS  ? enoxaparin (LOVENOX) injection  40 mg Subcutaneous Q24H  ? feeding supplement  237 mL Oral TID BM  ? fluticasone  1 spray Each Nare Daily  ? lisinopril  30 mg Oral Daily  ? loratadine  10 mg Oral Daily  ? mouth rinse  15 mL Mouth Rinse BID  ? mirtazapine  15 mg Oral QHS  ? pantoprazole  40 mg Oral BID  ? potassium chloride  20 mEq Oral BID  ? propranolol  20 mg Oral TID  ? saccharomyces boulardii  250 mg Oral BID  ? simethicone  80 mg Oral QID  ? sucralfate  1 g Oral QID  ? zolpidem  5 mg Oral QHS  ? ?Continuous Infusions: ? sodium chloride 10 mL/hr at 07/23/21 0600  ? sodium chloride    ?Data Reviewed: I have personally reviewed following labs and imaging studies ?CBC: ?No results for input(s): WBC, NEUTROABS, HGB, HCT, MCV, PLT in the last 168 hours. ?Basic Metabolic Panel: ?Recent Labs  ?Lab 08/09/21 ?0532 08/13/21 ?1047  ?NA 130* 128*  ?K 4.4 4.3  ?CL 95* 92*  ?CO2 29 29  ?GLUCOSE 91 81  ?BUN 13 14  ?CREATININE 0.64 0.65  ?CALCIUM 8.8* 8.5*  ?Antimicrobials: ?Anti-infectives (From admission, onward)  ? ? Start     Dose/Rate Route Frequency Ordered Stop  ? 07/01/21 2200  nirmatrelvir/ritonavir EUA (PAXLOVID) 3 tablet  Status:  Discontinued       ? 3 tablet Oral 2 times daily 07/01/21 1900 07/01/21 1935  ? 07/01/21 2200  molnupiravir EUA (LAGEVRIO) capsule 800  mg       ? 4 capsule Oral 2 times daily 07/01/21 1935 07/06/21 2159  ? ?  ? ?Culture/Microbiology ?   ?Component Value Date/Time  ? SDES  06/04/2021 1014  ?  URINE, CLEAN CATCH ?Performed at [facility], [address] [place], [place] [address] ?  ? SPECREQUEST  06/04/2021 1014  ?  NONE ?Performed at [facility], [address], [place], [place] [address] ?  ? CULT  06/04/2021 1014  ?  NO GROWTH ?Performed at [facility], [place] [address], [place],  [address] ?  ? REPTSTATUS 06/05/2021 FINAL 06/04/2021 1014  ?  ?Other culture-see note  ?Radiology Studies: ?No results found. ? ? LOS: 44 days  ? ?Antonieta Pert, MD ?[facility] ? ?08/15/2021, 10:13 AM  ?  ?

## 2021-08-15 NOTE — Progress Notes (Addendum)
?   08/15/21 0434  ?Vitals  ?Temp 98 ?F (36.7 ?C)  ?Temp Source Oral  ?BP (!) 91/54  ?MAP (mmHg) (!) 63  ?BP Location Left Arm  ?BP Method Automatic  ?Patient Position (if appropriate) Lying  ?Pulse Rate 66  ?Resp 18  ?MEWS COLOR  ?MEWS Score Color Green  ?Oxygen Therapy  ?SpO2 97 %  ?O2 Device Room Air  ?MEWS Score  ?MEWS Temp 0  ?MEWS Systolic 1  ?MEWS Pulse 0  ?MEWS RR 0  ?MEWS LOC 0  ?MEWS Score 1  ? ?B/P lower than usual. Pt is anxious about lower B/P, states he has a bad headache and that he must be dehydrated. Wants an IV and bloodwork. Attempting to redirect this anxious behavior. Encouraged to drink more water. Will monitor BP, Amion text page sent to [facility]  ?

## 2021-08-15 NOTE — Progress Notes (Signed)
Pt is still awake & reports that he cant sleep. Requesting more Ambien, discussed other ways to fall asleep without more medication .  ?

## 2021-08-15 NOTE — Consult Note (Cosign Needed Addendum)
[facility]iat[facility] Consult   Reason for Consult:  Suicide attempt by overdose on cymbalta Referring Physician:  Dr. 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 male patient admitted with suicide attempt by overdose.  Patient presented after overdosing on (60) 60 mg tablets of Cymbalta, he subsequently developed serotonin syndrome and severe hyponatremia that resulted in admission to intensive care for hyponatremia protocol.  08/15/2021: Patient seen and assessed today by the psychiatric nurse practitioner.  Patient presents with improved affect, brightens upon approach. Patient states he had a good weekend, and has using his coping skills. He states he walks several times a day, and believes he may have aggravated his back from all the walking. He continues to endorse poor sleep, and requesting some help with sleep or sleep aids. He also reflects on the thought that the coping skills seem to help him remain positive. Patient advised me he would rather have his morning dose reduced vs night time dose. While he managed to focus on his medications and need for benzodiazapine he did not perseverate on them. He was able to remain focus and complete his thoughts, without being distracted on his medications. He is open minded to  suggestions, and medication changes at this time.  He denies suicidal ideations, homicidal ideations and or hallucinations at this time.   Patient is now psychiatrically stable discharge to skilled nursing or Alternative living facility. Patient will not do well if he were to return home, even with ACT/CST team services.   HPI:  Anthony Lamb is a 66 y.o. male with a past medical history significant for hypertension, GERD, peptic ulcer disease, ankylosing spondylitis, irritable bowel syndrome, chronic pain, anxiety, and depression who presents for suicide attempt by overdose.  According to EMS and patient, at 545 this morning, patient tried to overdose by taking 60 duloxetine tablets.  EMS thought that they were 60 mg tablets by report.  Patient reports she is feeling some waxing waning nausea, lightheadedness, and feels like he has no bowel movement.  He feels very dehydrated and tired.  He also is feeling very anxious and agitated.  He still says he wants to kill himself.  He reports that he was discharged from the emergency department yesterday at [facility] and he still says he is going to try to kill himself he leaves again today.  Past Psychiatric History: Suicidal ideations, generalized anxiety, major depressive disorder recurrent with atypical features, alcohol abuse, benzodiazepine use disorder severe  Risk to Self: Yes Risk to Others: Denies Prior Inpatient Therapy: Yes most recent [facility] December 2022 Prior Outpatient Therapy: Denies currently followed by Dr. Nancy Fetter at [facility].  Past Medical History:  Past Medical History:  Diagnosis Date   Alcohol abuse, in remission    Anal fissure    Anemia    Ankylosing spondylitis (HCC)    Anxiety    Arthritis  Chronic diarrhea    Chronic headaches    Chronic pain    Colitis    Colon polyps    Depression    Esophagitis    Gastric polyps    hyperplastic and fundic gland   Gastritis    GERD (gastroesophageal reflux  disease)    Hypertension    IBS (irritable bowel syndrome)    Poor dentition    SIADH (syndrome of inappropriate ADH production) ([place])     Past Surgical History:  Procedure Laterality Date   BIOPSY  11/26/2018   Procedure: BIOPSY;  Surgeon: Gatha Mayer, MD;  Location: [facility];  Service: Endoscopy;;   COLONOSCOPY W/ BIOPSIES     COLONOSCOPY WITH PROPOFOL N/A 11/26/2018   Procedure: COLONOSCOPY WITH PROPOFOL;  Surgeon: Gatha Mayer, MD;  Location: [facility];  Service: Endoscopy;  Laterality: N/A;   ESOPHAGOGASTRODUODENOSCOPY     ESOPHAGOGASTRODUODENOSCOPY (EGD) WITH PROPOFOL N/A 11/26/2018   Procedure: ESOPHAGOGASTRODUODENOSCOPY (EGD) WITH PROPOFOL;  Surgeon: Gatha Mayer, MD;  Location: [facility];  Service: Endoscopy;  Laterality: N/A;   HEMOSTASIS CLIP PLACEMENT  11/26/2018   Procedure: HEMOSTASIS CLIP PLACEMENT;  Surgeon: Gatha Mayer, MD;  Location: [facility];  Service: Endoscopy;;   HERNIA REPAIR Bilateral    HOT HEMOSTASIS N/A 11/26/2018   Procedure: HOT HEMOSTASIS (ARGON PLASMA COAGULATION/BICAP);  Surgeon: Gatha Mayer, MD;  Location: [facility];  Service: Endoscopy;  Laterality: N/A;   OPEN REDUCTION INTERNAL FIXATION (ORIF) DISTAL RADIAL FRACTURE Right 02/11/2018   Procedure: OPEN REDUCTION INTERNAL FIXATION (ORIF) DISTAL RADIAL FRACTURE;  Surgeon: Milly Jakob, MD;  Location: [facility];  Service: Orthopedics;  Laterality: Right;   POLYPECTOMY  11/26/2018   Procedure: POLYPECTOMY;  Surgeon: Gatha Mayer, MD;  Location: [facility];  Service: Endoscopy;;   TONSILLECTOMY     Family History:  Family History  Problem Relation Age of Onset   Anxiety disorder Mother    Congestive Heart Failure Mother    Crohn's disease Mother    Colon cancer Mother 68   Arthritis Father    High blood pressure Father    Crohn's disease Father    Family Psychiatric  History: Denies Social History:  Social History   Substance and Sexual Activity  Alcohol Use Not  Currently     Social History   Substance and Sexual Activity  Drug Use Not Currently   Types: Marijuana    Social History   Socioeconomic History   Marital status: Single    Spouse name: Not on file   Number of children: 0   Years of education: Not on file   Highest education level: Not on file  Occupational History   Not on file  Tobacco Use   Smoking status: Former    Types: Cigarettes    Quit date: 2014    Years since quitting: 9.2   Smokeless tobacco: Never  Vaping Use   Vaping Use: Never used  Substance and Sexual Activity   Alcohol use: Not Currently   Drug use: Not Currently    Types: Marijuana   Sexual activity: Not Currently  Other Topics Concern   Not on file  Social History Narrative   HSG. Long - term disability - unable to work. Lived with his mother in her house - she died 2023/04/29 -    Lives in apartment   Has case worker   Social Determinants of Radio broadcast assistant Strain: Not on [employer] Insecurity: Not on file  Transportation Needs: Not on file  Physical Activity: Not on file  Stress: Not on file  Social Connections: Not on file   Additional Social History:    Allergies:   Allergies  Allergen Reactions   Nsaids Other (See Comments)    GI upset- history of peptic ulcers!!   Diphenhydramine Hcl Other (See Comments)    Restlessness   Flexeril [Cyclobenzaprine] Other (See Comments)    Restlessness   Fluoxetine Other (See Comments)    Made the patient feel "worse than before" (treatment)   Hctz [Hydrochlorothiazide] Other (See Comments)    Caused to lose sodium when taking with Lisinopril   Rexulti [Brexpiprazole] Other (See Comments)    "Made me not feel right- restless"   Seroquel [Quetiapine] Other (See Comments)    Restless legs and makes the patient sweat- also does not help patient's insomnia. Pt takes this at home in 2023.   Gabapentin Diarrhea   Hydroxyzine Other (See Comments)    Restlessness    Labs:  No results  found for this or any previous visit (from the past 48 hour(s)).     Current Facility-Administered Medications  Medication Dose Route Frequency Provider Last Rate Last Admin   0.9 %  sodium chloride infusion   Intravenous PRN Dwyane Dee, MD 10 mL/hr at 07/23/21 0600 Infusion Verify at 07/23/21 0600   acetaminophen (TYLENOL) tablet 650 mg  650 mg Oral Q4H PRN Dwyane Dee, MD   650 mg at 08/15/21 0913   alum & mag hydroxide-simeth (MAALOX/MYLANTA) 200-200-20 MG/5ML suspension 30 mL  30 mL Oral Q4H PRN Dwyane Dee, MD   30 mL at 08/14/21 1452   ARIPiprazole (ABILIFY) tablet 2 mg  2 mg Oral QHS Suella Broad, FNP   2 mg at 08/14/21 2118   clonazePAM (KLONOPIN) tablet 0.25 mg  0.25 mg Oral q morning Suella Broad, FNP   0.25 mg at 08/15/21 6222   And   clonazePAM (KLONOPIN) tablet 1 mg  1 mg Oral QHS Suella Broad, FNP   1 mg at 08/14/21 2118   enoxaparin (LOVENOX) injection 40 mg  40 mg Subcutaneous Q24H Dwyane Dee, MD   40 mg at 08/14/21 2119   feeding supplement (ENSURE ENLIVE / ENSURE PLUS) liquid 237 mL  237 mL Oral TID BM Lavina Hamman, MD   237 mL at 08/15/21 0913   fluticasone (FLONASE) 50 MCG/ACT nasal spray 1 spray  1 spray Each Nare Daily Kc, Maren Beach, MD   1 spray at 08/15/21 0914   [START ON 08/16/2021] lisinopril (ZESTRIL) tablet 20 mg  20 mg Oral Daily Kc, Ramesh, MD       loperamide (IMODIUM) capsule 2 mg  2 mg Oral PRN Samuella Cota, MD   2 mg at 08/14/21 0912   loratadine (CLARITIN) tablet 10 mg  10 mg Oral Daily Manuella Ghazi, Pratik D, DO   10 mg at 08/15/21 0913   MEDLINE mouth rinse  15 mL Mouth Rinse BID Dwyane Dee, MD   15 mL at 08/15/21 1018   mirtazapine (REMERON) tablet 15 mg  15 mg Oral QHS Suella Broad, FNP   15 mg at 08/14/21 2118   naphazoline-pheniramine (NAPHCON-A) 0.025-0.3 % ophthalmic solution 1 drop  1 drop Both Eyes QID PRN Heath Lark D, DO   1 drop at 07/19/21 1750   oxyCODONE (Oxy IR/ROXICODONE) immediate  release tablet 5 mg  5 mg Oral Q12H PRN Manuella Ghazi, Pratik D, DO   5 mg at 08/15/21 1059  pantoprazole (PROTONIX) EC tablet 40 mg  40 mg Oral BID Lavina Hamman, MD   40 mg at 08/15/21 5852   polyvinyl alcohol (LIQUIFILM TEARS) 1.4 % ophthalmic solution 1 drop  1 drop Both Eyes PRN Jennye Boroughs, MD   1 drop at 07/17/21 2106   potassium chloride SA (KLOR-CON M) CR tablet 20 mEq  20 mEq Oral BID Phillips Grout, MD   20 mEq at 08/15/21 0913   propranolol (INDERAL) tablet 20 mg  20 mg Oral TID Mariel Aloe, MD   20 mg at 08/14/21 2118   saccharomyces boulardii (FLORASTOR) capsule 250 mg  250 mg Oral BID Lavina Hamman, MD   250 mg at 08/15/21 7782   simethicone (MYLICON) chewable tablet 80 mg  80 mg Oral QID Lavina Hamman, MD   80 mg at 08/15/21 0913   sodium chloride (OCEAN) 0.65 % nasal spray 1 spray  1 spray Each Nare PRN Dwyane Dee, MD   1 spray at 07/25/21 0920   sucralfate (CARAFATE) tablet 1 g  1 g Oral QID Dwyane Dee, MD   1 g at 08/15/21 0913   zolpidem (AMBIEN) tablet 5 mg  5 mg Oral Standley Brooking, MD   5 mg at 08/14/21 2118    Musculoskeletal: Strength & Muscle Tone: within normal limits Gait & Station: normal Patient leans: N/A     Psychiatric Specialty Exam:  Presentation  General Appearance: Appropriate for Environment; Casual  Eye Contact:Good  Speech:Clear and Coherent; Normal Rate  Speech Volume:Normal  Handedness:Right   Mood and Affect  Mood:Anxious  Affect:Appropriate; Congruent   Thought Process  Thought Processes:Coherent; Linear  Descriptions of Associations:Intact  Orientation:Full (Time, Place and Person)  Thought Content:Logical  History of Schizophrenia/Schizoaffective disorder:No  Duration of Psychotic Symptoms:N/A  Hallucinations:Hallucinations: None  Ideas of Reference:None  Suicidal Thoughts:Suicidal Thoughts: No  Homicidal Thoughts:Homicidal Thoughts: No   Sensorium  Memory:Immediate Fair; Recent Fair; Remote  Fair  Judgment:Fair  Insight:Fair   Executive Functions  Concentration:Fair  Attention Span:Fair  [place]   Psychomotor Activity  Psychomotor Activity:Psychomotor Activity: Normal   Assets  Assets:Communication Skills; Financial Resources/Insurance; Physical Health; Resilience; Desire for Improvement   Sleep  Sleep:Sleep: Poor   Physical Exam: Physical Exam Vitals and nursing note reviewed.  Constitutional:      Appearance: Normal appearance. He is normal weight.  HENT:     Head: Normocephalic.     Nose: Nose normal.  Eyes:     Pupils: Pupils are equal, round, and reactive to light.  Skin:    General: Skin is warm.  Neurological:     General: No focal deficit present.     Mental Status: He is alert and oriented to person, place, and time. Mental status is at baseline.  Psychiatric:        Mood and Affect: Mood normal.        Behavior: Behavior normal.        Thought Content: Thought content normal.        Judgment: Judgment normal.   Review of Systems  Psychiatric/Behavioral:  Negative for depression, hallucinations, memory loss, substance abuse and suicidal ideas. The patient is nervous/anxious and has insomnia.   All other systems reviewed and are negative. Blood pressure 127/66, pulse 82, temperature 98.1 F (36.7 C), temperature source Oral, resp. rate 18, height 5' 10"  (1.778 m), weight 68.2 kg, SpO2 100 %. Body mass index is 21.57 kg/m.   Treatment Plan  Summary: Daily contact with patient to assess and evaluate symptoms and progress in treatment, Medication management, and Plan     Recommended for inpatient psychiatric treatment.   -Patient is to remain under involuntary commitment at this time.     -Will d/c  Abilify 53m and start Zyprexa 2.537mpo qhs to target psychosis, depressive symptoms, anxiety, and suicidal ideations.   -Will continue Klonopin 0.25 mg p.o. daily every morning; and Klonopin 1  mg p.o. nightly. He understands the goal is to continue with slow modest taper. Last change was 07/20/2021. Patient was allowed to be involved in the decision making.   -Will continue mirtazapine 15 mg p.o. nightly.   -Continue Propanolol  2039m.o. 3 times daily.  Insomnia-Patient continues to endorse poor sleep habits recommend documenting sleep hours byt night shift.  -Will continue to work towards disposition for this patient will likely require long term care and supervision.   -Continue to use coping skills to help with behavior modification and anxiety reducing measures.  Disposition: No evidence of imminent risk to self or others at present.   Patient does not meet criteria for psychiatric inpatient admission. Supportive therapy provided about ongoing stressors. Discussed crisis plan, support from social network, calling 911, coming to the Emergency Department, and calling Suicide Hotline.   TakSuella BroadNP 08/15/2021 2:09 PM

## 2021-08-15 NOTE — Progress Notes (Signed)
Mobility Specialist - Progress Note ? ? ? 08/15/21 1526  ?Mobility  ?Activity Stood at bedside;Transferred from chair to bed  ?Level of Assistance Independent after set-up  ?Assistive Device None  ?Distance Ambulated (ft) 5 ft  ?Activity Response Tolerated well  ?$Mobility charge 1 Mobility  ? ?Pt needed encouragement prior to mobilizing. Pt sat and stood at EOB with no assistance needed. Practiced sit to stand x2 before moving to recliner. Once sitting up, pt c/o of spinal pain and requested to return to bed. Pt left with call bell at side and all other necessities in reach.  ? ?Nik Gorrell S. ?Mobility Specialist ?[facility] ?Phone: [phone] ?08/15/21, 3:33 PM ? ? ? ?

## 2021-08-15 NOTE — TOC Progression Note (Addendum)
Transition of Care (TOC) - Progression Note  ? ? ?Patient Details  ?Name: Anthony Lamb ?MRN: [id number] ?Date of Birth: 07/17/1955 ? ?Transition of Care (TOC) CM/SW Contact  ?[place], LCSW ?Phone Number: ?08/15/2021, 1:09 PM ? ?Clinical Narrative:    ? ?CSW called [facility]; received email address that a referral can be sent to: [email or web address] ? ?CSW emailed referral  ? ?1600: Received voicemail from [facility] with [facility] requesting call back regarding pt. CSW called and left Pamala Hurry a message at [phone] ? ?CSW faxed referral to [facility] ? ?  ?  ? ?Expected Discharge Plan and Services ?  ?  ?  ?  ?  ?                ?  ?  ?  ?  ?  ?  ?  ?  ?  ?  ? ? ?Social Determinants of Health (SDOH) Interventions ?  ? ?Readmission Risk Interventions ?No flowsheet data found. ? ?

## 2021-08-16 ENCOUNTER — Inpatient Hospital Stay (HOSPITAL_COMMUNITY): Payer: Medicare Other

## 2021-08-16 LAB — URINALYSIS, COMPLETE (UACMP) WITH MICROSCOPIC
Bacteria, UA: NONE SEEN
Bilirubin Urine: NEGATIVE
Glucose, UA: NEGATIVE mg/dL
Ketones, ur: NEGATIVE mg/dL
Leukocytes,Ua: NEGATIVE
Nitrite: NEGATIVE
Protein, ur: NEGATIVE mg/dL
Specific Gravity, Urine: 1.006 (ref 1.005–1.030)
pH: 7 (ref 5.0–8.0)

## 2021-08-16 LAB — CBC
HCT: 32.9 % — ABNORMAL LOW (ref 39.0–52.0)
Hemoglobin: 10.9 g/dL — ABNORMAL LOW (ref 13.0–17.0)
MCH: 31.3 pg (ref 26.0–34.0)
MCHC: 33.1 g/dL (ref 30.0–36.0)
MCV: 94.5 fL (ref 80.0–100.0)
Platelets: 290 10*3/uL (ref 150–400)
RBC: 3.48 MIL/uL — ABNORMAL LOW (ref 4.22–5.81)
RDW: 12.4 % (ref 11.5–15.5)
WBC: 15.1 10*3/uL — ABNORMAL HIGH (ref 4.0–10.5)
nRBC: 0 % (ref 0.0–0.2)

## 2021-08-16 LAB — PROCALCITONIN: Procalcitonin: <0.1 ng/mL

## 2021-08-16 MED ORDER — SODIUM CHLORIDE 0.9 % IV SOLN
3.0000 g | Freq: Four times a day (QID) | INTRAVENOUS | Status: DC
  Administered 2021-08-16 – 2021-08-18 (×9): 3 g via INTRAVENOUS
  Filled 2021-08-16 (×10): qty 8

## 2021-08-16 MED ORDER — KETOROLAC TROMETHAMINE 15 MG/ML IJ SOLN
15.0000 mg | Freq: Once | INTRAMUSCULAR | Status: AC
  Administered 2021-08-16: 15 mg via INTRAVENOUS
  Filled 2021-08-16: qty 1

## 2021-08-16 MED ORDER — TUBERCULIN PPD 5 UNIT/0.1ML ID SOLN
5.0000 [IU] | Freq: Once | INTRADERMAL | Status: AC
Start: 2021-08-16 — End: 2021-08-18
  Administered 2021-08-16: 5 [IU] via INTRADERMAL
  Filled 2021-08-16: qty 0.1

## 2021-08-16 NOTE — TOC Progression Note (Signed)
Transition of Care (TOC) - Progression Note  ? ? ?Patient Details  ?Name: DEJAN ANGERT ?MRN: [id number] ?Date of Birth: Sep 02, 1955 ? ?Transition of Care (TOC) CM/SW Contact  ?Joaquin Courts, RN ?Phone Number: ?08/16/2021, 2:48 PM ? ?Clinical Narrative:    ?FL2 sent for review with [facility] and [facility]. ? ? ?  ?  ? ?Expected Discharge Plan and Services ?  ?  ?  ?  ?  ?                ?  ?  ?  ?  ?  ?  ?  ?  ?  ?  ? ? ?Social Determinants of Health (SDOH) Interventions ?  ? ?Readmission Risk Interventions ?No flowsheet data found. ? ?

## 2021-08-16 NOTE — TOC Progression Note (Incomplete Revision)
Transition of Care (TOC) - Progression Note  ? ? ?Patient Details  ?Name: Anthony Lamb ?MRN: [id number] ?Date of Birth: 1955-12-09 ? ?Transition of Care (TOC) CM/SW Contact  ?[place], LCSW ?Phone Number: ?08/16/2021, 11:25 AM ? ?Clinical Narrative:  ? ? CSW called [facility]; spoke with Ferrer Comunidad in admissions. She will review referral and call CSW back.  ? ?Contacted Hydia with [facility] to follow up on referral; awaiting response.  ? ?CSW spoke with pt and confirmed he is agreeable to ALF or [facility]. States ?  ?[id number]: CSW called [facility] and requested [place]. She is not available, CSW left message with secretary requesting call back.  ? ?Expected Discharge Plan and Services ?  ?  ?  ?  ?  ?                ?  ?  ?  ?  ?  ?  ?  ?  ?  ?  ? ? ?Social Determinants of Health (SDOH) Interventions ?  ? ?Readmission Risk Interventions ?No flowsheet data found. ? ?

## 2021-08-16 NOTE — Progress Notes (Signed)
Pharmacy Antibiotic Note ? ?Anthony Lamb is a 66 y.o. male who has been hospitalized  since 07/01/2021 for psych disorders and drug overdose.  He c/o cough and fever on 3/14 with CXR that showed findings with concern for infection or aspiration. Pharmacy has been consulted to dose unasyn for aspiration PNA. ? ?Plan: ?- unasyn 3gm IV q6h ?- with good renal function, pharmacy will sign off for abx.  Re-consult [place] if need further assistance. ? ?Height: 5' 10"  (177.8 cm) ?Weight: 68.2 kg (150 lb 5.7 oz) ?IBW/kg (Calculated) : 73 ? ?Temp (24hrs), Avg:99.2 ?F (37.3 ?C), Min:98.1 ?F (36.7 ?C), Max:100.6 ?F (38.1 ?C) ? ?Recent Labs  ?Lab 08/13/21 ?1047 08/16/21 ?1031  ?WBC  --  15.1*  ?CREATININE 0.65  --   ?  ?Estimated Creatinine Clearance: 88.8 mL/min (by C-G formula based on SCr of 0.65 mg/dL).   ? ?Allergies  ?Allergen Reactions  ? Nsaids Other (See Comments)  ?  GI upset- history of peptic ulcers!!  ? Diphenhydramine Hcl Other (See Comments)  ?  Restlessness  ? Flexeril [Cyclobenzaprine] Other (See Comments)  ?  Restlessness  ? Fluoxetine Other (See Comments)  ?  Made the patient feel "worse than before" (treatment)  ? Hctz [Hydrochlorothiazide] Other (See Comments)  ?  Caused to lose sodium when taking with Lisinopril  ? Rexulti [Brexpiprazole] Other (See Comments)  ?  "Made me not feel right- restless"  ? Seroquel [Quetiapine] Other (See Comments)  ?  Restless legs and makes the patient sweat- also does not help patient's insomnia. Pt takes this at home in 2023.  ? Gabapentin Diarrhea  ? Hydroxyzine Other (See Comments)  ?  Restlessness  ? ? ? ?Thank you for allowing pharmacy to be a part of this patient?s care. ? ?Lynelle Doctor ?08/16/2021 11:13 AM ? ?

## 2021-08-16 NOTE — [place] FL2 (Signed)
?[place] MEDICAID FL2 LEVEL OF CARE SCREENING TOOL  ?  ? ?IDENTIFICATION  ?Patient Name: ?Anthony Lamb Birthdate: 03/21/1956 Sex: male Admission Date (Current Location): ?07/01/2021  ?[place] and [place] Number: ? [place] ?  Facility and Address:  ?[facility],  [place], [place] ?     Provider Number: ?[id number]  ?Attending Physician Name and Address:  ?Antonieta Pert, MD ? Relative Name and Phone Number:  ?  ?   ?Current Level of Care: ?Domiciliary (Rest home) Recommended Level of Care: ?[place] Prior Approval Number: ?  ? ?Date Approved/Denied: ?  PASRR Number: ?  ? ?Discharge Plan: ?Domiciliary (Rest home) ?  ? ?Current Diagnoses: ?Patient Active Problem List  ? Diagnosis Date Noted  ? Antidepressant overdose   ? 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 Tremor 07/21/2021  ? Overdose 07/02/2021  ? Overdose of antidepressant, intentional self-harm, initial encounter ([place]) 07/01/2021  ? Intentional overdose ([place])   ? Suicide attempt ([place]) 06/30/2021  ? Malingering 06/30/2021  ? MDD (major depressive disorder), recurrent episode, with atypical features ([place]) 05/07/2021  ? Suicidal ideation 04/05/2021  ? MDD (major depressive disorder), recurrent episode, severe ([place]) 03/31/2021  ? Substance induced mood disorder ([place]) 03/31/2021  ? Family history of colon cancer 12/02/2018  ? Gastric polyps   ? Schatzki's ring   ? Left sided ulcerative colitis without complication ([place])   ? Odynophagia 11/25/2018  ? Dysphagia 11/25/2018  ? Major depressive disorder, recurrent severe without psychotic features ([place]) 06/24/2018  ? IBS (irritable bowel syndrome)   ? Hypertension   ? Colitis   ? Adjustment disorder with mixed anxiety and depressed mood 05/11/2017  ? Alcohol abuse   ? Anxiety   ? Gastroesophageal reflux disease   ? Osteopenia determined by x-ray 08/06/2014  ? Stress fracture of calcaneus 08/06/2014  ? Vitamin D  deficiency 12/18/2013  ? Dementia ([place]) 12/18/2013  ? Benign microscopic hematuria 07/06/2013  ? SIADH (syndrome of inappropriate ADH production) ([place]) 06/20/2013  ? Ankylosing spondylitis ([place]) 06/20/2013  ? Malnutrition of moderate degree ([place]) 06/20/2013  ? Major depression, recurrent ([place]) 05/31/2013  ? BPH (benign prostatic hyperplasia) 08/22/2010  ? History of colonic polyps 08/13/2008  ? Essential hypertension 07/03/2007  ? PUD (peptic ulcer disease) 07/03/2007  ? ? ?Orientation RESPIRATION BLADDER Height & Weight   ?  ?Self, Time, Situation, Place ? Normal Continent Weight: 68.2 kg ?Height:  5' 10"  (177.8 cm)  ?BEHAVIORAL SYMPTOMS/MOOD NEUROLOGICAL BOWEL NUTRITION STATUS  ?    Continent Diet  ?AMBULATORY STATUS COMMUNICATION OF NEEDS Skin   ?Supervision Verbally Normal ?  ?  ?  ?    ?     ?     ? ? ?Personal Care Assistance Level of Assistance  ?Bathing, Dressing, Feeding Bathing Assistance: Limited assistance ?Feeding assistance: Independent ?Dressing Assistance: Limited assistance ?   ? ?Functional Limitations Info  ?    ?  ?   ? ? ?SPECIAL CARE FACTORS FREQUENCY  ?    ?  ?  ?  ?  ?  ?  ?   ? ? ?Contractures Contractures Info: Not present  ? ? ?Additional Factors Info  ?Code Status, Allergies Code Status Info: Full ?Allergies Info: Nsaids, Diphenhydramine Hcl, Flexeril (Cyclobenzaprine), Fluoxetine, Hctz (Hydrochlorothiazide), Rexulti (Brexpiprazole), Seroquel (Quetiapine), Gabapentin, Hydroxyzine ?  ?  ?  ?   ? ?Current Medications (08/16/2021):  This is the current hospital active medication  list ?Current Facility-Administered Medications  ?Medication Dose Route Frequency Provider Last Rate Last Admin  ? 0.9 %  sodium chloride infusion   Intravenous PRN Dwyane Dee, MD 10 mL/hr at 07/23/21 0600 Infusion Verify at 07/23/21 0600  ? acetaminophen (TYLENOL) tablet 650 mg  650 mg Oral Q4H PRN Dwyane Dee, MD   650 mg at 08/16/21 1420  ? alum & mag hydroxide-simeth (MAALOX/MYLANTA) 200-200-20 MG/5ML  suspension 30 mL  30 mL Oral Q4H PRN Dwyane Dee, MD   30 mL at 08/14/21 1452  ? Ampicillin-Sulbactam (UNASYN) 3 g in sodium chloride 0.9 % 100 mL IVPB  3 g Intravenous Q6H Pham, Anh P, RPH 200 mL/hr at 08/16/21 1231 3 g at 08/16/21 1231  ? clonazePAM (KLONOPIN) tablet 0.25 mg  0.25 mg Oral q morning Suella Broad, FNP   0.25 mg at 08/16/21 3536  ? And  ? clonazePAM (KLONOPIN) tablet 1 mg  1 mg Oral QHS Suella Broad, FNP   1 mg at 08/15/21 2143  ? enoxaparin (LOVENOX) injection 40 mg  40 mg Subcutaneous Q24H Dwyane Dee, MD   40 mg at 08/15/21 2144  ? feeding supplement (ENSURE ENLIVE / ENSURE PLUS) liquid 237 mL  237 mL Oral TID BM Lavina Hamman, MD   237 mL at 08/16/21 1420  ? fluticasone (FLONASE) 50 MCG/ACT nasal spray 1 spray  1 spray Each Nare Daily Antonieta Pert, MD   1 spray at 08/16/21 0926  ? lisinopril (ZESTRIL) tablet 20 mg  20 mg Oral Daily Kc, Ramesh, MD   20 mg at 08/16/21 0925  ? loperamide (IMODIUM) capsule 2 mg  2 mg Oral PRN Samuella Cota, MD   2 mg at 08/14/21 0912  ? loratadine (CLARITIN) tablet 10 mg  10 mg Oral Daily Manuella Ghazi, Pratik D, DO   10 mg at 08/16/21 1443  ? MEDLINE mouth rinse  15 mL Mouth Rinse BID Dwyane Dee, MD   15 mL at 08/16/21 0926  ? mirtazapine (REMERON) tablet 15 mg  15 mg Oral QHS Suella Broad, FNP   15 mg at 08/15/21 2143  ? naphazoline-pheniramine (NAPHCON-A) 0.025-0.3 % ophthalmic solution 1 drop  1 drop Both Eyes QID PRN Heath Lark D, DO   1 drop at 07/19/21 1750  ? OLANZapine (ZYPREXA) tablet 2.5 mg  2.5 mg Oral QHS Suella Broad, FNP   2.5 mg at 08/15/21 2143  ? oxyCODONE (Oxy IR/ROXICODONE) immediate release tablet 5 mg  5 mg Oral Q12H PRN Manuella Ghazi, Pratik D, DO   5 mg at 08/16/21 1141  ? pantoprazole (PROTONIX) EC tablet 40 mg  40 mg Oral BID Lavina Hamman, MD   40 mg at 08/16/21 1540  ? polyvinyl alcohol (LIQUIFILM TEARS) 1.4 % ophthalmic solution 1 drop  1 drop Both Eyes PRN Jennye Boroughs, MD   1 drop at 07/17/21  2106  ? potassium chloride SA (KLOR-CON M) CR tablet 20 mEq  20 mEq Oral BID Phillips Grout, MD   20 mEq at 08/16/21 0867  ? propranolol (INDERAL) tablet 20 mg  20 mg Oral TID Mariel Aloe, MD   20 mg at 08/16/21 6195  ? saccharomyces boulardii (FLORASTOR) capsule 250 mg  250 mg Oral BID Lavina Hamman, MD   250 mg at 08/16/21 0932  ? simethicone (MYLICON) chewable tablet 80 mg  80 mg Oral QID Lavina Hamman, MD   80 mg at 08/16/21 1420  ? sodium chloride (OCEAN) 0.65 % nasal  spray 1 spray  1 spray Each Nare PRN Dwyane Dee, MD   1 spray at 07/25/21 0920  ? sucralfate (CARAFATE) tablet 1 g  1 g Oral QID Dwyane Dee, MD   1 g at 08/16/21 1420  ? zolpidem (AMBIEN) tablet 5 mg  5 mg Oral Standley Brooking, MD   5 mg at 08/15/21 2143  ? ? ? ?Discharge Medications: ?Please see discharge summary for a list of discharge medications. ? ?Relevant Imaging Results: ? ?Relevant Lab Results: ? ? ?Additional Information ?SSN [id number] ? ?Joaquin Courts, RN ? ? ? ? ?

## 2021-08-16 NOTE — Progress Notes (Signed)
?PROGRESS NOTE ?Anthony Lamb  DHR:[id number] DOB: 1955-11-30 DOA: 07/01/2021 ?PCP: Sandi Mariscal, MD  ? ?Brief Narrative/Hospital Course: ?66 year old man presented after an intentional overdose of duloxetine and admitted on 1/273 for suicidal drug overdose incidental Covid, treate,severe hyponatremia w/ history of SIADH, seen by nephrology and treated with hypertonic saline, it was felt to be from significant inappropriate ADH activation in the setting of duloxetine overdose likely in a patient with underlying mild hyponatremia from EtOH use. ?Hosp course listed as- admitted 127. On 1/28 started on Molnupiravir for incidental COVID- 1/29 transferred to stepdown unit for 3% hypertonic saline for severe hyponatremia, 2/4 nephrology signed off and 07/12/21 Deemed medically stable to transfer to inpatient psychiatric facility, 07/30/21  PICC line removed.Patient has been medically stable for discharge pending for inpatient psych. ?With further follow-up by psychiatry they have deemed IVC can be rescinded 08/12/21 and referred patient for assisted living. ?  ?Subjective: ?Seen and examined this morning.  Complains of some cough and  low-grade fever 100.6, rest of the vitals stable ?Denies dysuria chest pain. ? ?Assessment and Plan: ?Overdose of antidepressant, intentional self-harm, initial encounter ([place]) ?Suicidal attempt with overdose of antidepressant ?Major depression recurrent ?[place] or anxiolytic induced anxiety disorder: ?Presented with intentional overdose of duloxetine-now is stable cleared by psych.On chronic benzo and multiple suicide attempts of high lethality in the past.Initially on IVC-now rescinded.continue current benzodiazepine as per psych-they have notified all parties involved in prescribing the med to pt. ?Cont with current Abilify 2 mg qhs,Klonopin 0.5 daily,1 mg qhs,Remeron 7.5 mg qhs and inderal 20 mg Tid-for tremors,on  Ambien PRN.Cont PT/OT-will need ALF,unable to do self  care. ? 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 ?Fever/pneumonia: Chest x-ray shows airspace opacity in the peripheral left lung and patient with cough, leukocytosis fever- will start on Unasyn speech eval to rule out aspiration. ? ?GERD:Continue PPI,carafete, simethicone, probiotics.  ? ?Tremor:Continue Inderal as above. ? ?Alcohol abuse:Has been here over a month no risk of withdrawal ? ?Ankylosing spondylitis Seen on x ray, known past history.  ? ?Hyponatremia ?SIADH:  ?Due to duloxetine overdose, managed with hypertonic saline initially sodium is stabilized in  nephrology signed off.  Monitor closely. BMP in 1 week.  ?Recent Labs  ?Lab 08/13/21 ?1047  ?NA 128*  ?  ?  ?Essential hypertension ?BP soft  s/p 250 cc bolus 3/13,holding lisinopril and dose decreased from 75m> 289mOn Inderal for tremors. ? ?Chest pain-resolved as of 08/04/2021 ?Not typical. Multiple EKG normal. No further evaluation. ? ?Allergic reaction-resolved as of 08/04/2021 ?unclear etiology. Occurred on 2/3. Resolved with Benadryl, Pepcid and Solu-medrol. ? ?COVID-19 virus infection-resolved as of 08/04/2021 ?Diagnosed on 07/01/21. Incidental. Treated with molnupiravir. Isolation discontinued. ? ?DVT prophylaxis: enoxaparin (LOVENOX) injection 40 mg Start: 07/01/21 2200 ?SCDs Start: 07/01/21 1108 ?Code Status:   Code Status: Full Code ?Family Communication: plan of care discussed with patient/RN at bedside. ? ?Disposition: Currently is NOT medically stable for discharge. ?Status is: Inpatient ?Remains inpatient appropriate because: Cleared by psychiatry, advised assisted living facility as patient will require supervision as not able to care for self and normal home environment. ? ?Objective: ?Vitals last 24 hrs: ?Vitals:  ? 08/15/21 1335 08/15/21 1936 08/16/21 0515 08/16/21 0923  ?BP: 127/66 139/88 116/65 117/64  ?Pulse: 82 91 81 80  ?Resp: 18 20 16    ?Temp: 98.1 ?F (36.7  ?C) 98.9 ?F (37.2 ?C) (!) 100.6 ?F (38.1 ?C)   ?TempSrc: Oral Oral Oral   ?  SpO2: 100% 98% 96% 97%  ?Weight:      ?Height:      ? ?Weight change:  ? ?Physical Examination: ?General exam: AA0, older than stated age, weak appearing. ?HEENT:Oral mucosa moist, Ear/Nose WNL grossly, dentition normal. ?Respiratory system: bilaterally diminished-with left basal crackles, no use of accessory muscle ?Cardiovascular system: S1 & S2 +, No JVD,. ?Gastrointestinal system: Abdomen soft,NT,ND,BS+ ?Nervous System:Alert, awake, moving extremities and grossly nonfocal ?Extremities: LE ankle edema , distal peripheral pulses palpable.  ?Skin: No rashes,no icterus. ?MSK: Normal muscle bulk,tone, power  ? ?Medications reviewed:  ?Scheduled Meds: ? clonazePAM  0.25 mg Oral q morning  ? And  ? clonazePAM  1 mg Oral QHS  ? enoxaparin (LOVENOX) injection  40 mg Subcutaneous Q24H  ? feeding supplement  237 mL Oral TID BM  ? fluticasone  1 spray Each Nare Daily  ? lisinopril  20 mg Oral Daily  ? loratadine  10 mg Oral Daily  ? mouth rinse  15 mL Mouth Rinse BID  ? mirtazapine  15 mg Oral QHS  ? OLANZapine  2.5 mg Oral QHS  ? pantoprazole  40 mg Oral BID  ? potassium chloride  20 mEq Oral BID  ? propranolol  20 mg Oral TID  ? saccharomyces boulardii  250 mg Oral BID  ? simethicone  80 mg Oral QID  ? sucralfate  1 g Oral QID  ? zolpidem  5 mg Oral QHS  ? ?Continuous Infusions: ? sodium chloride 10 mL/hr at 07/23/21 0600  ?Data Reviewed: I have personally reviewed following labs and imaging studies ?CBC: ?Recent Labs  ?Lab Aug 30, 2021 ?1031  ?WBC 15.1*  ?HGB 10.9*  ?HCT 32.9*  ?MCV 94.5  ?PLT 290  ? ?Basic Metabolic Panel: ?Recent Labs  ?Lab 08/13/21 ?1047  ?NA 128*  ?K 4.3  ?CL 92*  ?CO2 29  ?GLUCOSE 81  ?BUN 14  ?CREATININE 0.65  ?CALCIUM 8.5*  ?Antimicrobials: ?Anti-infectives (From admission, onward)  ? ? Start     Dose/Rate Route Frequency Ordered Stop  ? 07/01/21 2200  nirmatrelvir/ritonavir EUA (PAXLOVID) 3 tablet  Status:  Discontinued        ? 3 tablet Oral 2 times daily 07/01/21 1900 07/01/21 1935  ? 07/01/21 2200  molnupiravir EUA (LAGEVRIO) capsule 800 mg       ? 4 capsule Oral 2 times daily 07/01/21 1935 07/06/21 2159  ? ?  ? ?Culture/Microbiology ?   ?Component Value Date/Time  ? SDES  06/04/2021 1014  ?  URINE, CLEAN CATCH ?Performed at [facility], [address], [place], [place] [address] ?  ? SPECREQUEST  06/04/2021 1014  ?  NONE ?Performed at [facility], [address] [place], [place] [address] ?  ? CULT  06/04/2021 1014  ?  NO GROWTH ?Performed at [facility], [place] [address] [place], [place] [address] ?  ? REPTSTATUS 06/05/2021 FINAL 06/04/2021 1014  ?  ?Other culture-see note  ?Radiology Studies: ?DG Chest Port 1 View ? ?Result Date: 2021/08/30 ?CLINICAL DATA:  Fever, overdose EXAM: PORTABLE CHEST 1 VIEW COMPARISON:  07/22/2021 FINDINGS: The heart size and mediastinal contours are within normal limits. New heterogeneous airspace opacity of the peripheral left lung base. The visualized skeletal structures are unremarkable. IMPRESSION: New heterogeneous airspace opacity of the peripheral left lung base, concerning for infection or aspiration. Electronically Signed   By: Delanna Ahmadi M.D.   On: 08/30/21 10:00   ? ? LOS: 45 days  ? ?Antonieta Pert, MD ?  [facility] ? ?08/16/2021, 10:49 AM  ?  ?

## 2021-08-16 NOTE — TOC Progression Note (Signed)
Transition of Care (TOC) - Progression Note  ? ? ?Patient Details  ?Name: Anthony Lamb ?MRN: [id number] ?Date of Birth: 1956-04-12 ? ?Transition of Care (TOC) CM/SW Contact  ?Joaquin Courts, RN ?Phone Number: ?08/16/2021, 4:14 PM ? ?Clinical Narrative:    ?CM received call back from [place] with [facility], she would like to come tomorrow 3/15 at 1230 pm to meet patient in person prior to making final decision on referral. ? ? ?  ?  ? ?Expected Discharge Plan and Services ?  ?  ?  ?  ?  ?                ?  ?  ?  ?  ?  ?  ?  ?  ?  ?  ? ? ?Social Determinants of Health (SDOH) Interventions ?  ? ?Readmission Risk Interventions ?No flowsheet data found. ? ?

## 2021-08-16 NOTE — Evaluation (Signed)
Clinical/Bedside Swallow Evaluation ?Patient Details  ?Name: Anthony Lamb ?MRN: [id number] ?Date of Birth: Dec 01, 1955 ? ?Today's Date: 08/16/2021 ?Time: SLP Start Time (ACUTE ONLY): 7169 SLP Stop Time (ACUTE ONLY): 1435 ?SLP Time Calculation (min) (ACUTE ONLY): 15 min ? ?Past Medical History:  ?Past Medical History:  ?Diagnosis Date  ? Alcohol abuse, in remission   ? Anal fissure   ? Anemia   ? Ankylosing spondylitis ([place])   ? Anxiety   ? Arthritis   ? Chronic diarrhea   ? Chronic headaches   ? Chronic pain   ? Colitis   ? Colon polyps   ? Depression   ? Esophagitis   ? Gastric polyps   ? hyperplastic and fundic gland  ? Gastritis   ? GERD (gastroesophageal reflux disease)   ? Hypertension   ? IBS (irritable bowel syndrome)   ? Poor dentition   ? SIADH (syndrome of inappropriate ADH production) ([place])   ? ?Past Surgical History:  ?Past Surgical History:  ?Procedure Laterality Date  ? BIOPSY  11/26/2018  ? Procedure: BIOPSY;  Surgeon: Gatha Mayer, MD;  Location: [facility];  Service: Endoscopy;;  ? COLONOSCOPY W/ BIOPSIES    ? COLONOSCOPY WITH PROPOFOL N/A 11/26/2018  ? Procedure: COLONOSCOPY WITH PROPOFOL;  Surgeon: Gatha Mayer, MD;  Location: [facility];  Service: Endoscopy;  Laterality: N/A;  ? ESOPHAGOGASTRODUODENOSCOPY    ? ESOPHAGOGASTRODUODENOSCOPY (EGD) WITH PROPOFOL N/A 11/26/2018  ? Procedure: ESOPHAGOGASTRODUODENOSCOPY (EGD) WITH PROPOFOL;  Surgeon: Gatha Mayer, MD;  Location: [facility];  Service: Endoscopy;  Laterality: N/A;  ? HEMOSTASIS CLIP PLACEMENT  11/26/2018  ? Procedure: HEMOSTASIS CLIP PLACEMENT;  Surgeon: Gatha Mayer, MD;  Location: [facility];  Service: Endoscopy;;  ? HERNIA REPAIR Bilateral   ? HOT HEMOSTASIS N/A 11/26/2018  ? Procedure: HOT HEMOSTASIS (ARGON PLASMA COAGULATION/BICAP);  Surgeon: Gatha Mayer, MD;  Location: [facility];  Service: Endoscopy;  Laterality: N/A;  ? OPEN REDUCTION INTERNAL FIXATION (ORIF) DISTAL RADIAL FRACTURE Right 02/11/2018  ? Procedure:  OPEN REDUCTION INTERNAL FIXATION (ORIF) DISTAL RADIAL FRACTURE;  Surgeon: Milly Jakob, MD;  Location: [place];  Service: Orthopedics;  Laterality: Right;  ? POLYPECTOMY  11/26/2018  ? Procedure: POLYPECTOMY;  Surgeon: Gatha Mayer, MD;  Location: [facility];  Service: Endoscopy;;  ? TONSILLECTOMY    ? ?HPI:  ?Patient is a 66 y.o. male with PMH: ETOH abuse in remission, ankylosing spondylitis, anxiety, depression, osteoarthritis, colitis, chronic diarrhea, headaches, esophagitis, gastric polyps, GERD, HTN, multiple ER visits since September for either suicidal ideation, anxiety or epigastric pain. Patient has been admitted since 07/01/20. .  In ED, patient was afebrile, BP normal, oxygen saturations 100% on RA. UDS positive for benzodiazepines. Neurology s/o on 07/12/21 and deemed patient medically stable for transfer to inpatient psychiatric facility.  SLP swallow evaluation ordered on 3/14 due to Chest x-ray shows airspace opacity in the peripheral left lung and patient with cough, leukocytosis fever- will start on Unasyn speech eval to rule out aspiration.  ?  ?Assessment / Plan / Recommendation  ?Clinical Impression ? Patient not currently presenting with clinical s/s of dysphagia as per this bedside/clinical swallow evaluation. With consecutive staw sips of thin liquids (water), patient exhibited timely swallow initiation and no overt s/s aspiration or penetration. He did exhibit one instance of belching after drinking water. Patient politely declined any other PO's at this time. When asked about his swallowing, he reports it is difficulty at times and "I can't swallow anything". He does endorse  reflux/GERD symptoms as well as taking PPI (Protonix). He would frequently repeat concern that "I think I have throat cancer". He reported that he was supposed to get an endoscopy but they "didnt do it." Esophagram from 05/2021 showed Mid to distal esophageal tertiary contractions as can be seen  with esophageal spasm  versus presbyesophagus, no hiatal hernia. SLP recommended to patient that he continue to follow up with his PCP for continued management of his GERD. No further SLP intervention needed at this time. ?SLP Visit Diagnosis: Dysphagia, unspecified (R13.10) ?   ?Aspiration Risk ? No limitations;Mild aspiration risk  ?  ?Diet Recommendation Regular;Thin liquid  ? ?Liquid Administration via: Cup;Straw ?Medication Administration: Whole meds with liquid ?Supervision: Patient able to self feed ?Compensations: Slow rate;Small sips/bites ?Postural Changes: Seated upright at 90 degrees;Remain upright for at least 30 minutes after po intake  ?  ?Other  Recommendations Oral Care Recommendations: Oral care BID   ? ?Recommendations for follow up therapy are one component of a multi-disciplinary discharge planning process, led by the attending physician.  Recommendations may be updated based on patient status, additional functional criteria and insurance authorization. ? ?Follow up Recommendations No SLP follow up  ? ? ?  ?Assistance Recommended at Discharge None  ?Functional Status Assessment Patient has had a recent decline in their functional status and demonstrates the ability to make significant improvements in function in a reasonable and predictable amount of time.  ?Frequency and Duration    ? N/A ?  ?   ? ?Prognosis   N/A ? ?  ? ?Swallow Study   ?General Date of Onset: 08/16/21 ?HPI: Patient is a 66 y.o. male with PMH: ETOH abuse in remission, ankylosing spondylitis, anxiety, depression, osteoarthritis, colitis, chronic diarrhea, headaches, esophagitis, gastric polyps, GERD, HTN, multiple ER visits since September for either suicidal ideation, anxiety or epigastric pain. Patient has been admitted since 07/01/20. .  In ED, patient was afebrile, BP normal, oxygen saturations 100% on RA. UDS positive for benzodiazepines. Neurology s/o on 07/12/21 and deemed patient medically stable for transfer to inpatient psychiatric facility.   SLP swallow evaluation ordered on 3/14 due to Chest x-ray shows airspace opacity in the peripheral left lung and patient with cough, leukocytosis fever- will start on Unasyn speech eval to rule out aspiration. ?Type of Study: Bedside Swallow Evaluation ?Previous Swallow Assessment: none found ?Diet Prior to this Study: Regular;Thin liquids ?Temperature Spikes Noted: No ?Respiratory Status: Room air ?History of Recent Intubation: No ?Behavior/Cognition: Cooperative;Pleasant mood;Alert ?Oral Cavity Assessment: Within Functional Limits ?Oral Care Completed by SLP: No ?Oral Cavity - Dentition: Edentulous;Dentures, not available ?Vision: Functional for self-feeding ?Self-Feeding Abilities: Able to feed self ?Patient Positioning: Upright in bed ?Baseline Vocal Quality: Normal ?Volitional Cough: Strong ?Volitional Swallow: Able to elicit  ?  ?Oral/Motor/Sensory Function Overall Oral Motor/Sensory Function: Within functional limits   ?Ice Chips     ?Thin Liquid Thin Liquid: Within functional limits ?Presentation: Straw;Self Fed  ?  ?Nectar Thick     ?Honey Thick     ?Puree Puree: Not tested   ?Solid ? ? ?  Solid: Not tested  ? ?  ? ?Sonia Baller, MA, CCC-SLP ?Speech Therapy ? ? ? ? ?

## 2021-08-16 NOTE — TOC Progression Note (Addendum)
Transition of Care (TOC) - Progression Note  ? ? ?Patient Details  ?Name: Anthony Lamb ?MRN: [id number] ?Date of Birth: August 12, 1955 ? ?Transition of Care (TOC) CM/SW Contact  ?[place], LCSW ?Phone Number: ?08/16/2021, 11:25 AM ? ?Clinical Narrative:  ? ? CSW called [facility]; spoke with [place] in admissions. She will review referral and call CSW back.  ? ?Contacted Hydia with [facility] to follow up on referral; awaiting response.  ? ?CSW spoke with pt and confirmed he is agreeable to ALF or [facility]. States ?  ?[id number]: CSW called [facility] and requested [place]. She is not available, CSW left message with secretary requesting call back.  ? ?Expected Discharge Plan and Services ?  ?  ?  ?  ?  ?                ?  ?  ?  ?  ?  ?  ?  ?  ?  ?  ? ? ?Social Determinants of Health (SDOH) Interventions ?  ? ?Readmission Risk Interventions ?No flowsheet data found. ? ?

## 2021-08-17 LAB — CBC
HCT: 29 % — ABNORMAL LOW (ref 39.0–52.0)
Hemoglobin: 9.7 g/dL — ABNORMAL LOW (ref 13.0–17.0)
MCH: 31.7 pg (ref 26.0–34.0)
MCHC: 33.4 g/dL (ref 30.0–36.0)
MCV: 94.8 fL (ref 80.0–100.0)
Platelets: 241 10*3/uL (ref 150–400)
RBC: 3.06 MIL/uL — ABNORMAL LOW (ref 4.22–5.81)
RDW: 12.4 % (ref 11.5–15.5)
WBC: 10.3 10*3/uL (ref 4.0–10.5)
nRBC: 0 % (ref 0.0–0.2)

## 2021-08-17 LAB — BASIC METABOLIC PANEL
Anion gap: 9 (ref 5–15)
BUN: 11 mg/dL (ref 8–23)
CO2: 24 mmol/L (ref 22–32)
Calcium: 8.5 mg/dL — ABNORMAL LOW (ref 8.9–10.3)
Chloride: 95 mmol/L — ABNORMAL LOW (ref 98–111)
Creatinine, Ser: 0.58 mg/dL — ABNORMAL LOW (ref 0.61–1.24)
GFR, Estimated: >60 mL/min (ref >60)
Glucose, Bld: 96 mg/dL (ref 70–99)
Potassium: 4 mmol/L (ref 3.5–5.1)
Sodium: 128 mmol/L — ABNORMAL LOW (ref 135–145)

## 2021-08-17 LAB — PROCALCITONIN: Procalcitonin: <0.1 ng/mL

## 2021-08-17 NOTE — Progress Notes (Signed)
Patient was visited by a possible facility that may take him in the [facility] area. This is very upsetting to patient he states that he "wants to stay in [place]". Pt is very anxious and is asking for something extra to help w/ his anxiety. I re-enforced his coping mechanisms and advised him that I would as MD and psych NP about additional medications. I will continue to educate patient on coping skills and to use them. [name], NP w/ psych is not adding new orders. Pt is aware.  ?

## 2021-08-17 NOTE — TOC Progression Note (Signed)
Transition of Care (TOC) - Progression Note  ? ? ?Patient Details  ?Name: YUYA VANWINGERDEN ?MRN: [id number] ?Date of Birth: 12/27/1955 ? ?Transition of Care (TOC) CM/SW Contact  ?[place], LCSW ?Phone Number: ?08/17/2021, 12:58 PM ? ?Clinical Narrative:    ? ?Cory Munch care coordinator with [facility] called regarding pt. She is inquiring about disposition as she was informed that pt called his Secondary school teacher and notified them that he was going to a nursing home. CSW informed Pamala Hurry that [facility] is seeking ALF or [facility] home, that referrals have been sent, and [facility] is evaluating him today. She requests for future updates regarding pt placement. [phone] ? ?  ?  ? ?Expected Discharge Plan and Services ?  ?  ?  ?  ?  ?                ?  ?  ?  ?  ?  ?  ?  ?  ?  ?  ? ? ?Social Determinants of Health (SDOH) Interventions ?  ? ?Readmission Risk Interventions ?No flowsheet data found. ? ?

## 2021-08-17 NOTE — Progress Notes (Signed)
?PROGRESS NOTE ?Anthony Lamb  TXM:[id number] DOB: 01-17-1956 DOA: 07/01/2021 ?PCP: Sandi Mariscal, MD  ? ?Brief Narrative/Hospital Course: ?66 year old man presented after an intentional overdose of duloxetine and admitted on 1/273 for suicidal drug overdose incidental Covid, treate,severe hyponatremia w/ history of SIADH, seen by nephrology and treated with hypertonic saline, it was felt to be from significant inappropriate ADH activation in the setting of duloxetine overdose likely in a patient with underlying mild hyponatremia from EtOH use. ?Hosp course listed as- admitted 127. On 1/28 started on Molnupiravir for incidental COVID- 1/29 transferred to stepdown unit for 3% hypertonic saline for severe hyponatremia, 2/4 nephrology signed off and 07/12/21 Deemed medically stable to transfer to inpatient psychiatric facility, 07/30/21  PICC line removed.Patient has been medically stable for discharge pending for inpatient psych. ?With further follow-up by psychiatry they have deemed IVC can be rescinded 08/12/21 and referred patient for assisted living. ?  ?Subjective: ?Seen and examined this morning.  Complains of ongoing anxiety issues ?Asking for IV Toradol ?Overnight patient is afebrile on room air vitals stable ?Labs showed stable sodium levels improved leukocytosis ?Skin TB test was administered 3/14 evening for ALF placement ? ?Assessment and Plan: ?Overdose of antidepressant, intentional self-harm, initial encounter ([place]) ?Suicidal attempt with overdose of antidepressant ?Major depression recurrent ?[place] or anxiolytic induced anxiety disorder: ?Presented with intentional overdose of duloxetine-now is stable cleared by psych.On chronic benzo and multiple suicide attempts of high lethality in the past.Initially on IVC-now rescinded.continue current benzodiazepine per psych-they have notified all parties involved in prescribing the med to pt- On abilify 2 mg qhs,Klonopin 0.5 daily,1 mg qhs,Remeron 7.5 mg  qhs and inderal 20 mg Tid-for tremors,on  Ambien PRN, Zyprexa 2.5 bedtime. ?Cont PT/OT-will need ALF,unable to do self care. ? 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 ?Fever/pneumonia: Chest x-ray shows airspace opacity in the peripheral left lung and patient with cough, leukocytosis fever-started on Unasyn 3/14-if patient is improved, we can transition to Augmentin for discharge to assisted living facility  ? ?GERD:Continue PPI,carafete, simethicone, probiotics.  ? ?Tremor:Continue Inderal as above. ? ?Alcohol abuse:Has been here over a month no risk of withdrawal ? ?Ankylosing spondylitis Seen on x ray, known past history.  ? ?Hyponatremia ?SIADH:  ?Due to duloxetine overdose, managed with hypertonic saline initially sodium is stabilized in  nephrology signed off.  Monitor closely. BMP in 1 week.  ?Recent Labs  ?Lab 08/13/21 ?1047 08/17/21 ?4825  ?NA 128* 128*  ?  ?Essential hypertension ?BP soft  s/p 250 cc bolus 3/13,holding lisinopril and dose decreased from 14m> 245mOn Inderal for tremors. ? ?Chest pain-resolved as of 08/04/2021 ?Not typical. Multiple EKG normal. No further evaluation. ? ?Allergic reaction-resolved as of 08/04/2021 ?unclear etiology. Occurred on 2/3. Resolved with Benadryl, Pepcid and Solu-medrol. ? ?COVID-19 virus infection-resolved as of 08/04/2021 ?Diagnosed on 07/01/21. Incidental. Treated with molnupiravir. Isolation discontinued. ? ?DVT prophylaxis: enoxaparin (LOVENOX) injection 40 mg Start: 07/01/21 2200 ?SCDs Start: 07/01/21 1108 ?Code Status:   Code Status: Full Code ?Family Communication: plan of care discussed with patient/RN at bedside. ? ?Disposition: Currently is medically stable for discharge. ?Status is: Inpatient ?Remains inpatient appropriate because: Cleared by psychiatry, advised assisted living facility as patient will require supervision as not able to care for self and normal home  environment. ?Awaiting for ALF placement, skin TB test ordered 3/14. ? ?Objective: ?Vitals last 24 hrs: ?Vitals:  ? 08/16/21 2128 08/17/21 0418 08/17/21 0800 08/17/21 0854  ?BP: (!) 108/57 109/62  136/74  ?Pulse: (!) 59 68  90  ?Resp: 18 18    ?Temp: 99.4 ?F (37.4 ?C) 98.7 ?F (37.1 ?C)    ?TempSrc: Oral Oral    ?SpO2: 100% 98%    ?Weight:   67.5 kg   ?Height:      ? ?Weight change:  ? ?Physical Examination: ?General exam: AA, anxious, on room air older than stated age, weak appearing. ?HEENT:Oral mucosa moist, Ear/Nose WNL grossly, dentition normal. ?Respiratory system: bilaterally diminished,no use of accessory muscle ?Cardiovascular system: S1 & S2 +, No JVD,. ?Gastrointestinal system: Abdomen soft,NT,ND, BS+ ?Nervous System:Alert, awake, moving extremities and grossly nonfocal ?Extremities: edema neg,distal peripheral pulses palpable.  ?Skin: No rashes,no icterus. ?MSK: Normal muscle bulk,tone, power ? ? ?Medications reviewed:  ?Scheduled Meds: ? clonazePAM  0.25 mg Oral q morning  ? And  ? clonazePAM  1 mg Oral QHS  ? enoxaparin (LOVENOX) injection  40 mg Subcutaneous Q24H  ? feeding supplement  237 mL Oral TID BM  ? fluticasone  1 spray Each Nare Daily  ? lisinopril  20 mg Oral Daily  ? loratadine  10 mg Oral Daily  ? mouth rinse  15 mL Mouth Rinse BID  ? mirtazapine  15 mg Oral QHS  ? OLANZapine  2.5 mg Oral QHS  ? pantoprazole  40 mg Oral BID  ? potassium chloride  20 mEq Oral BID  ? propranolol  20 mg Oral TID  ? saccharomyces boulardii  250 mg Oral BID  ? simethicone  80 mg Oral QID  ? sucralfate  1 g Oral QID  ? tuberculin  5 Units Intradermal Once  ? zolpidem  5 mg Oral QHS  ? ?Continuous Infusions: ? sodium chloride 10 mL/hr at 07/23/21 0600  ? ampicillin-sulbactam (UNASYN) IV 3 g (08/17/21 0630)  ?Data Reviewed: I have personally reviewed following labs and imaging studies ?CBC: ?Recent Labs  ?Lab 27-Aug-2021 ?1031 08/17/21 ?8546  ?WBC 15.1* 10.3  ?HGB 10.9* 9.7*  ?HCT 32.9* 29.0*  ?MCV 94.5 94.8  ?PLT 290  241  ? ?Basic Metabolic Panel: ?Recent Labs  ?Lab 08/13/21 ?1047 08/17/21 ?2703  ?NA 128* 128*  ?K 4.3 4.0  ?CL 92* 95*  ?CO2 29 24  ?GLUCOSE 81 96  ?BUN 14 11  ?CREATININE 0.65 0.58*  ?CALCIUM 8.5* 8.5*  ?Antimicrobials: ?Anti-infectives (From admission, onward)  ? ? Start     Dose/Rate Route Frequency Ordered Stop  ? August 27, 2021 1200  Ampicillin-Sulbactam (UNASYN) 3 g in sodium chloride 0.9 % 100 mL IVPB       ? 3 g ?200 mL/hr over 30 Minutes Intravenous Every 6 hours 27-Aug-2021 1115    ? 07/01/21 2200  nirmatrelvir/ritonavir EUA (PAXLOVID) 3 tablet  Status:  Discontinued       ? 3 tablet Oral 2 times daily 07/01/21 1900 07/01/21 1935  ? 07/01/21 2200  molnupiravir EUA (LAGEVRIO) capsule 800 mg       ? 4 capsule Oral 2 times daily 07/01/21 1935 07/06/21 2159  ? ?  ? ?Culture/Microbiology ?   ?Component Value Date/Time  ? SDES  06/04/2021 1014  ?  URINE, CLEAN CATCH ?Performed at [facility], [address], [place], [place] [address] ?  ? SPECREQUEST  06/04/2021 1014  ?  NONE ?Performed at [facility], [address], [place], [place] [address] ?  ? CULT  06/04/2021 1014  ?  NO GROWTH ?Performed at [facility], [place] [address] [place], [place] [address] ?  ?  REPTSTATUS 06/05/2021 FINAL 06/04/2021 1014  ?  ?Other culture-see note  ?Radiology Studies: ?DG Chest Port 1 View ? ?Result Date: 08/16/2021 ?CLINICAL DATA:  Fever, overdose EXAM: PORTABLE CHEST 1 VIEW COMPARISON:  07/22/2021 FINDINGS: The heart size and mediastinal contours are within normal limits. New heterogeneous airspace opacity of the peripheral left lung base. The visualized skeletal structures are unremarkable. IMPRESSION: New heterogeneous airspace opacity of the peripheral left lung base, concerning for infection or aspiration. Electronically Signed   By: Delanna Ahmadi M.D.   On: 08/16/2021 10:00   ? ? LOS: 46 days  ? ?Antonieta Pert, MD ?[facility] ? ?08/17/2021, 11:39 AM  ?  ?

## 2021-08-18 ENCOUNTER — Inpatient Hospital Stay (HOSPITAL_COMMUNITY): Payer: Medicare Other

## 2021-08-18 LAB — BASIC METABOLIC PANEL
Anion gap: 7 (ref 5–15)
BUN: 10 mg/dL (ref 8–23)
CO2: 27 mmol/L (ref 22–32)
Calcium: 8.3 mg/dL — ABNORMAL LOW (ref 8.9–10.3)
Chloride: 95 mmol/L — ABNORMAL LOW (ref 98–111)
Creatinine, Ser: 0.68 mg/dL (ref 0.61–1.24)
GFR, Estimated: >60 mL/min (ref >60)
Glucose, Bld: 101 mg/dL — ABNORMAL HIGH (ref 70–99)
Potassium: 3.9 mmol/L (ref 3.5–5.1)
Sodium: 129 mmol/L — ABNORMAL LOW (ref 135–145)

## 2021-08-18 LAB — PROCALCITONIN: Procalcitonin: <0.1 ng/mL

## 2021-08-18 MED ORDER — PIPERACILLIN-TAZOBACTAM 3.375 G IVPB
3.3750 g | Freq: Three times a day (TID) | INTRAVENOUS | Status: DC
  Administered 2021-08-18 – 2021-08-22 (×11): 3.375 g via INTRAVENOUS
  Filled 2021-08-18 (×13): qty 50

## 2021-08-18 MED ORDER — KETOROLAC TROMETHAMINE 15 MG/ML IJ SOLN
7.5000 mg | Freq: Once | INTRAMUSCULAR | Status: AC
  Administered 2021-08-18: 7.5 mg via INTRAVENOUS
  Filled 2021-08-18: qty 1

## 2021-08-18 MED ORDER — QUETIAPINE FUMARATE 25 MG PO TABS
50.0000 mg | ORAL_TABLET | Freq: Every day | ORAL | Status: DC
  Administered 2021-08-18 – 2021-08-21 (×4): 50 mg via ORAL
  Filled 2021-08-18 (×4): qty 2

## 2021-08-18 MED ORDER — VANCOMYCIN HCL IN DEXTROSE 1-5 GM/200ML-% IV SOLN
1000.0000 mg | Freq: Two times a day (BID) | INTRAVENOUS | Status: DC
  Administered 2021-08-19 – 2021-08-22 (×7): 1000 mg via INTRAVENOUS
  Filled 2021-08-18 (×9): qty 200

## 2021-08-18 MED ORDER — VANCOMYCIN HCL 1250 MG/250ML IV SOLN
1250.0000 mg | INTRAVENOUS | Status: AC
  Administered 2021-08-18: 1250 mg via INTRAVENOUS
  Filled 2021-08-18: qty 250

## 2021-08-18 NOTE — Plan of Care (Signed)
?  Problem: Education: ?Goal: Knowledge of risk factors and measures for prevention of condition will improve ?Outcome: Progressing ?  ?Problem: Coping: ?Goal: Psychosocial and spiritual needs will be supported ?Outcome: Progressing ?  ?Problem: Respiratory: ?Goal: Will maintain a patent airway ?Outcome: Progressing ?Goal: Complications related to the disease process, condition or treatment will be avoided or minimized ?Outcome: Progressing ?  ?Problem: Activity: ?Goal: Will identify at least one activity in which they can participate ?Outcome: Progressing ?  ?Problem: Coping: ?Goal: Ability to identify and develop effective coping behavior will improve ?Outcome: Progressing ?Goal: Ability to interact with others will improve ?Outcome: Progressing ?Goal: Demonstration of participation in decision-making regarding own care will improve ?Outcome: Progressing ?Goal: Ability to use eye contact when communicating with others will improve ?Outcome: Progressing ?  ?Problem: Health Behavior/Discharge Planning: ?Goal: Identification of resources available to assist in meeting health care needs will improve ?Outcome: Progressing ?  ?Problem: Self-Concept: ?Goal: Will verbalize positive feelings about self ?Outcome: Progressing ?  ?Problem: Education: ?Goal: Knowledge of General Education information will improve ?Description: Including pain rating scale, medication(s)/side effects and non-pharmacologic comfort measures ?Outcome: Progressing ?  ?Problem: Health Behavior/Discharge Planning: ?Goal: Ability to manage health-related needs will improve ?Outcome: Progressing ?  ?Problem: Clinical Measurements: ?Goal: Ability to maintain clinical measurements within normal limits will improve ?Outcome: Progressing ?Goal: Will remain free from infection ?Outcome: Progressing ?Goal: Diagnostic test results will improve ?Outcome: Progressing ?Goal: Respiratory complications will improve ?Outcome: Progressing ?Goal: Cardiovascular  complication will be avoided ?Outcome: Progressing ?  ?Problem: Activity: ?Goal: Risk for activity intolerance will decrease ?Outcome: Progressing ?  ?Problem: Nutrition: ?Goal: Adequate nutrition will be maintained ?Outcome: Progressing ?  ?Problem: Coping: ?Goal: Level of anxiety will decrease ?Outcome: Progressing ?  ?Problem: Elimination: ?Goal: Will not experience complications related to bowel motility ?Outcome: Progressing ?  ?Problem: Pain Managment: ?Goal: General experience of comfort will improve ?Outcome: Progressing ?  ?Problem: Safety: ?Goal: Ability to remain free from injury will improve ?Outcome: Progressing ?  ?Problem: Skin Integrity: ?Goal: Risk for impaired skin integrity will decrease ?Outcome: Progressing ?  ?

## 2021-08-18 NOTE — Progress Notes (Signed)
TB skin test read by this RN there is no induration at injection site.  ?

## 2021-08-18 NOTE — TOC Progression Note (Addendum)
Transition of Care (TOC) - Progression Note  ? ? ?Patient Details  ?Name: Anthony Lamb ?MRN: [id number] ?Date of Birth: 1956/04/20 ? ?Transition of Care (TOC) CM/SW Contact  ?[place], LCSW ?Phone Number: ?08/18/2021, 11:48 AM ? ?Clinical Narrative:    ? ?Pt was evaluated by [facility]. CSW is informed by [facility] supervisor that they can accept pt but that pt is hesitant about going to the care home. CSW met with pt to discuss disposition. Pt initially explains he doesn't want to go; he doesn't provide a reason why. CSW explains that facility options are very limited and that the hospital cannot hold him if we have a safe d/c plan. CSW offers support to pt. Pt begins to state things like "well I can't go today," "I'm not going today or tomorrow am I?" CSW explained that he is medically stable so once things were arranged with [facility] then he could DC. Pt explains he doesn't feel well and continues to be anxious about discharging. CSW inquires if pt has any supportive friends/family who could help with decision; he states no. CSW inquired about pt's care coordinator. He knew her name and is familiar with her. CSW offered to call her to see if she can offer insight/support for pt; he agrees to this. CSW will follow up at later time regarding pt decision.  ? ?CSW called and left message with pt's [place] [phone] ? ?1250: CSW called [phone]) with [facility]. She confirmed she could accept pt as soon as TB test is read and that pt's insurance and SSI is confirmed. At discharge, meds would need to be sent to [facility] in [place]. They do admit on weekends but their pharmacy is closed so he would need to be sent with physical medications on the weekend.  ? ?Expected Discharge Plan: Group Home ?Barriers to Discharge: Unsafe home situation ? ?Expected Discharge Plan and Services ?Expected Discharge Plan: Group  Home ?  ?  ?  ?  ?                ?  ?  ?  ?  ?  ?  ?  ?  ?  ?  ? ? ?Social Determinants of Health (SDOH) Interventions ?  ? ?Readmission Risk Interventions ?No flowsheet data found. ? ?

## 2021-08-18 NOTE — Consult Note (Signed)
[facility] Face-to-Face Psychiatry Consult  ? ?Reason for Consult:  Suicide attempt by overdose on cymbalta ?Referring Physician:  Dr. Sarajane Jews ? ?Patient Identification: Anthony Lamb ?MRN:  [id number] ?Principal Diagnosis: Overdose of antidepressant, intentional self-harm, initial encounter ([place]) ?Diagnosis:  Principal Problem: ?  Overdose of antidepressant, intentional self-harm, initial encounter ([place]) ?Active Problems: ?  Essential hypertension ?  Major depression, recurrent ([place]) ?  SIADH (syndrome of inappropriate ADH production) ([place]) ?  Gastroesophageal reflux disease ?  Alcohol abuse ?  Suicide attempt [facility]) ?  Tremor ?  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  Antidepressant overdose ? ? ?Total Time spent with patient: 20 minutes ? ?Subjective:   ?Anthony Lamb is a 66 y.o. male patient admitted with suicide attempt by overdose.  Patient presented after overdosing on (60) 60 mg tablets of Cymbalta, he subsequently developed serotonin syndrome and severe hyponatremia that resulted in admission to intensive care for hyponatremia protocol. ? ?08/18/2021: Patient seen and assessed today by the psychiatric nurse practitioner. Patient presents with anxious affect, and mood is congruent. Patient states "they are trying to send me to a place in [place] and I dont want to go there." Discussed with patient that his options are very limited and then sought clarification as his long term goals and wishes. In which he replies " Well I cant go today. I got Pneumonia and dont feel good. Why I cant I go home? Im not going there today am I? " Discussed with patient that once bed offer is received he will be able to transport to his new home. In which he again replied that he doesn't feel good and can not go there today. He then begins to cough, belch, and dry heave, and request emesis bag as he was going to vomit. Patient is encouraged to use his coping  skills. He then asks about medication management and requests to be placed on Seroquel vs Zyprexa. He Is notified of his allergy related to Seroquel, in which he denies, and states he is not allergic to Seroquel and doesn't have restless leg with this medication. Attempted to discuss molecular make up of Zyprexa vs Seroquel, and he is adamant about the medication working to help him sleep. He is open minded to suggestions, and medication changes at this time.  He denies suicidal ideations, homicidal ideations and or hallucinations at this time.  ? ?Patient is now psychiatrically stable discharge to skilled nursing or Alternative living facility. Patient will not do well if he were to return home, even with ACT/CST team services.  ? ?HPI:  Anthony Lamb is a 66 y.o. male with a past medical history significant for hypertension, GERD, peptic ulcer disease, ankylosing spondylitis, irritable bowel syndrome, chronic pain, anxiety, and depression who presents for suicide attempt by overdose.  According to EMS and patient, at 545 this morning, patient tried to overdose by taking 60 duloxetine tablets.  EMS thought that they were 60 mg tablets by report.  Patient reports she is feeling some waxing waning nausea, lightheadedness, and feels like he has no bowel movement.  He feels very dehydrated and tired.  He also is feeling very anxious and agitated.  He still says he wants to kill himself.  He reports that he was discharged from the emergency department yesterday at [facility] and he still says he is going to try to kill himself he leaves again today. ? ?Past Psychiatric History: Suicidal ideations, generalized anxiety,  major depressive disorder recurrent with atypical features, alcohol abuse, benzodiazepine use disorder severe ? ?Risk to Self: Yes ?Risk to Others: Denies ?Prior Inpatient Therapy: Yes most recent [facility] December 2022 ?Prior Outpatient Therapy: Denies currently followed by Dr. Nancy Fetter at [facility]. ? ?Past Medical History:  ?Past Medical History:  ?Diagnosis Date  ? Alcohol abuse, in remission   ? Anal fissure   ? Anemia   ? Ankylosing spondylitis ([place])   ? Anxiety   ? Arthritis   ? Chronic diarrhea   ? Chronic headaches   ? Chronic pain   ? Colitis   ? Colon polyps   ? Depression   ? Esophagitis   ? Gastric polyps   ? hyperplastic and fundic gland  ? Gastritis   ? GERD (gastroesophageal reflux disease)   ? Hypertension   ? IBS (irritable bowel syndrome)   ? Poor dentition   ? SIADH (syndrome of inappropriate ADH production) ([place])   ?  ?Past Surgical History:  ?Procedure Laterality Date  ? BIOPSY  11/26/2018  ? Procedure: BIOPSY;  Surgeon: Gatha Mayer, MD;  Location: [facility];  Service: Endoscopy;;  ? COLONOSCOPY W/ BIOPSIES    ? COLONOSCOPY WITH PROPOFOL N/A 11/26/2018  ? Procedure: COLONOSCOPY WITH PROPOFOL;  Surgeon: Gatha Mayer, MD;  Location: [facility];  Service: Endoscopy;  Laterality: N/A;  ? ESOPHAGOGASTRODUODENOSCOPY    ? ESOPHAGOGASTRODUODENOSCOPY (EGD) WITH PROPOFOL N/A 11/26/2018  ? Procedure: ESOPHAGOGASTRODUODENOSCOPY (EGD) WITH PROPOFOL;  Surgeon: Gatha Mayer, MD;  Location: [facility];  Service: Endoscopy;  Laterality: N/A;  ? HEMOSTASIS CLIP PLACEMENT  11/26/2018  ? Procedure: HEMOSTASIS CLIP PLACEMENT;  Surgeon: Gatha Mayer, MD;  Location: [facility];  Service: Endoscopy;;  ? HERNIA REPAIR Bilateral   ? HOT HEMOSTASIS N/A 11/26/2018  ? Procedure: HOT HEMOSTASIS (ARGON PLASMA COAGULATION/BICAP);  Surgeon: Gatha Mayer, MD;  Location: [facility];  Service: Endoscopy;  Laterality: N/A;  ? OPEN REDUCTION INTERNAL FIXATION (ORIF) DISTAL RADIAL FRACTURE Right 02/11/2018  ? Procedure: OPEN REDUCTION INTERNAL FIXATION (ORIF) DISTAL RADIAL FRACTURE;  Surgeon: Milly Jakob, MD;  Location: [facility];  Service: Orthopedics;  Laterality: Right;  ? POLYPECTOMY  11/26/2018  ? Procedure: POLYPECTOMY;  Surgeon: Gatha Mayer, MD;  Location: [facility];  Service: Endoscopy;;   ? TONSILLECTOMY    ? ?Family History:  ?Family History  ?Problem Relation Age of Onset  ? Anxiety disorder Mother   ? Congestive Heart Failure Mother   ? Crohn's disease Mother   ? Colon cancer Mother 79  ? Arthritis Father   ? High blood pressure Father   ? Crohn's disease Father   ? ?Family Psychiatric  History: Denies ?Social History:  ?Social History  ? ?Substance and Sexual Activity  ?Alcohol Use Not Currently  ?   ?Social History  ? ?Substance and Sexual Activity  ?Drug Use Not Currently  ? Types: Marijuana  ?  ?Social History  ? ?Socioeconomic History  ? Marital status: Single  ?  Spouse name: Not on file  ? Number of children: 0  ? Years of education: Not on file  ? Highest education level: Not on file  ?Occupational History  ? Not on file  ?Tobacco Use  ? Smoking status: Former  ?  Types: Cigarettes  ?  Quit date: 2014  ?  Years since quitting: 9.2  ? Smokeless tobacco: Never  ?Vaping Use  ? Vaping Use: Never used  ?Substance and Sexual Activity  ? Alcohol use: Not  Currently  ? Drug use: Not Currently  ?  Types: Marijuana  ? Sexual activity: Not Currently  ?Other Topics Concern  ? Not on file  ?Social History Narrative  ? [employer]. Long - term disability - unable to work. Lived with his mother in her house - she died 05/12/23 -   ? Lives in apartment  ? Has case worker  ? ?Social Determinants of Health  ? ?Financial Resource Strain: Not on file  ?Food Insecurity: Not on file  ?Transportation Needs: Not on file  ?Physical Activity: Not on file  ?Stress: Not on file  ?Social Connections: Not on file  ? ?Additional Social History: ?  ? ?Allergies:   ?Allergies  ?Allergen Reactions  ? Nsaids Other (See Comments)  ?  GI upset- history of peptic ulcers!!  ? Diphenhydramine Hcl Other (See Comments)  ?  Restlessness  ? Flexeril [Cyclobenzaprine] Other (See Comments)  ?  Restlessness  ? Fluoxetine Other (See Comments)  ?  Made the patient feel "worse than before" (treatment)  ? Hctz [Hydrochlorothiazide] Other (See  Comments)  ?  Caused to lose sodium when taking with Lisinopril  ? Rexulti [Brexpiprazole] Other (See Comments)  ?  "Made me not feel right- restless"  ? Seroquel [Quetiapine] Other (See Comments)  ?  Restless l

## 2021-08-18 NOTE — Progress Notes (Signed)
?PROGRESS NOTE ?Anthony Lamb  YBO:[id number] DOB: 1955-07-16 DOA: 07/01/2021 ?PCP: Sandi Mariscal, MD  ? ?Brief Narrative/Hospital Course: ?66 year old man presented after an intentional overdose of duloxetine and admitted on 1/273 for suicidal drug overdose incidental Covid, treate,severe hyponatremia w/ history of SIADH, seen by nephrology and treated with hypertonic saline, it was felt to be from significant inappropriate ADH activation in the setting of duloxetine overdose likely in a patient with underlying mild hyponatremia from EtOH use. ?Hosp course listed as- admitted 127. On 1/28 started on Molnupiravir for incidental COVID- 1/29 transferred to stepdown unit for 3% hypertonic saline for severe hyponatremia, 2/4 nephrology signed off and 07/12/21 Deemed medically stable to transfer to inpatient psychiatric facility, 07/30/21  PICC line removed.Patient has been medically stable for discharge pending for inpatient psych. ?With further follow-up by psychiatry they have deemed IVC can be rescinded 08/12/21 and referred patient for assisted living. ?  ?Subjective: ?Seen and examined this morning.  Endorses anxiety issues, generalized pain requesting IV Toradol, reports some sore throat. ?Patient had temperature spike overnight  ?Rest of the vitals stable.  On room air.   ?Skin TB test was administered 3/14 evening for ALF placement ? ?Assessment and Plan: ?Overdose of antidepressant, intentional self-harm, initial encounter ([place]) ?Suicidal attempt with overdose of antidepressant ?Major depression recurrent ?[place] or anxiolytic induced anxiety disorder: ?Presented with intentional overdose of duloxetine-now is stable cleared by psych.On chronic benzo and multiple suicide attempts of high lethality in the past.Initially on IVC-now rescinded.continue current benzodiazepine per psych-they have notified all parties involved in prescribing the med to pt- On abilify 2 mg qhs,Klonopin 0.5 daily,1 mg qhs,Remeron  7.5 mg qhs and inderal 20 mg Tid-for tremors,on  Ambien PRN, Zyprexa 2.5 bedtime. ?Cont PT/OT-will need ALF,unable to do self care.  TOC working on placement. ? 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 ?Fever/pneumonia: CXR 3/14 showed airspace opacity in the peripheral left lung he had  cough, leukocytosis. fever-so started on Unasyn 3/14.UA was unremarkable 3/14. With fever overnight we will repeat chest x-ray 2 views-if has recurrent fever or respiratory complaint will consider changing IV antibiotics.he is leukocytosis had resolved.  Monitor temperature curve. ? ?GERD: Stable on PPI,carafete, simethicone, probiotics.  ? ?Tremor: Stable on stable Inderal as above. ? ?Alcohol abuse: Stable continue vitamins ? ?Ankylosing spondylitis Seen on x ray, known past history.  ? ?Hyponatremia ?SIADH:  ?Due to duloxetine overdose, managed with hypertonic saline initially sodium is stabilized -  nephrology signed off.  Monitor closely.  Holding at 128 to 130s. BMP in 1 week.  ?Recent Labs  ?Lab 08/13/21 ?1047 08/17/21 ?2778 08/18/21 ?0500  ?NA 128* 128* 129*  ?  ?Essential hypertension:Blood pressure is stable at this time lisinopril dose decreased due to soft BP previously.   ? ?Chest pain-resolved as of 08/04/2021 ?Not typical. Multiple EKG normal. No further evaluation. ? ?Allergic reaction-resolved as of 08/04/2021 ?unclear etiology. Occurred on 2/3. Resolved with Benadryl, Pepcid and Solu-medrol. ? ?COVID-19 virus infection-resolved as of 08/04/2021 ?Diagnosed on 07/01/21. Incidental. Treated with molnupiravir. Isolation discontinued. ? ?DVT prophylaxis: enoxaparin (LOVENOX) injection 40 mg Start: 07/01/21 2200 ?SCDs Start: 07/01/21 1108 ?Code Status:   Code Status: Full Code ?Family Communication: plan of care discussed with patient/RN at bedside. ? ?Disposition: Currently is medically stable for discharge. ?Status is: Inpatient ?Remains inpatient  appropriate because: Cleared by psychiatry, advised assisted living facility as patient will require supervision as not able to care for self and normal  home environment. ?Awaiting for ALF placement, skin TB test ordered 2022/08/21. ?Patient was agitated by the facility staff from [facility] 3/15, patient voiced -wanting to stay in [place] area and was very anxious.With counseling reinforcement she is improved. ? ?Objective: ?Vitals last 24 hrs: ?Vitals:  ? 08/18/21 0117 08/18/21 0210 08/18/21 0431 08/18/21 0842  ?BP:  (!) 115/51 109/62 130/69  ?Pulse:  80 73 81  ?Resp:  20 19 19   ?Temp: (!) 101.2 ?F (38.4 ?C) 100 ?F (37.8 ?C) 98.9 ?F (37.2 ?C) 98.4 ?F (36.9 ?C)  ?TempSrc:  Oral Oral Oral  ?SpO2:  96% 99% 99%  ?Weight:      ?Height:      ? ?Weight change:  ? ?Physical Examination: ?General exam: Aa0x3, anxious on room air not in distress,older than stated age, weak appearing. ?HEENT:Oral mucosa moist, Ear/Nose WNL grossly, dentition normal. ?Respiratory system: bilaterally diminished, no use of accessory muscle ?Cardiovascular system: S1 & S2 +, No JVD,. ?Gastrointestinal system: Abdomen soft,NT,ND,BS+ ?Nervous System:Alert, awake, moving extremities and grossly nonfocal ?Extremities: LE ankle edema none, distal peripheral pulses palpable.  ?Skin: No rashes,no icterus. ?MSK: Normal muscle bulk,tone, power  ? ? ?Medications reviewed:  ?Scheduled Meds: ? clonazePAM  0.25 mg Oral q morning  ? And  ? clonazePAM  1 mg Oral QHS  ? enoxaparin (LOVENOX) injection  40 mg Subcutaneous Q24H  ? feeding supplement  237 mL Oral TID BM  ? fluticasone  1 spray Each Nare Daily  ? lisinopril  20 mg Oral Daily  ? loratadine  10 mg Oral Daily  ? mouth rinse  15 mL Mouth Rinse BID  ? mirtazapine  15 mg Oral QHS  ? OLANZapine  2.5 mg Oral QHS  ? pantoprazole  40 mg Oral BID  ? potassium chloride  20 mEq Oral BID  ? propranolol  20 mg Oral TID  ? saccharomyces boulardii  250 mg Oral BID  ? simethicone  80 mg Oral QID  ? sucralfate  1  g Oral QID  ? tuberculin  5 Units Intradermal Once  ? zolpidem  5 mg Oral QHS  ? ?Continuous Infusions: ? sodium chloride 10 mL/hr at 07/23/21 0600  ? ampicillin-sulbactam (UNASYN) IV 3 g (08/18/21 0529)  ?Data Reviewed: I have personally reviewed following labs and imaging studies ?CBC: ?Recent Labs  ?Lab 08-20-2021 ?1031 08/17/21 ?0998  ?WBC 15.1* 10.3  ?HGB 10.9* 9.7*  ?HCT 32.9* 29.0*  ?MCV 94.5 94.8  ?PLT 290 241  ? ?Basic Metabolic Panel: ?Recent Labs  ?Lab 08/13/21 ?1047 08/17/21 ?3382 08/18/21 ?0500  ?NA 128* 128* 129*  ?K 4.3 4.0 3.9  ?CL 92* 95* 95*  ?CO2 29 24 27   ?GLUCOSE 81 96 101*  ?BUN 14 11 10   ?CREATININE 0.65 0.58* 0.68  ?CALCIUM 8.5* 8.5* 8.3*  ?Antimicrobials: ?Anti-infectives (From admission, onward)  ? ? Start     Dose/Rate Route Frequency Ordered Stop  ? 2021/08/20 1200  Ampicillin-Sulbactam (UNASYN) 3 g in sodium chloride 0.9 % 100 mL IVPB       ? 3 g ?200 mL/hr over 30 Minutes Intravenous Every 6 hours August 20, 2021 1115    ? 07/01/21 2200  nirmatrelvir/ritonavir EUA (PAXLOVID) 3 tablet  Status:  Discontinued       ? 3 tablet Oral 2 times daily 07/01/21 1900 07/01/21 1935  ? 07/01/21 2200  molnupiravir EUA (LAGEVRIO) capsule 800 mg       ? 4 capsule Oral 2 times daily 07/01/21 1935 07/06/21 2159  ? ?  ? ?  Culture/Microbiology ?   ?Component Value Date/Time  ? SDES  06/04/2021 1014  ?  URINE, CLEAN CATCH ?Performed at [facility], [address], [place], [place] [address] ?  ? SPECREQUEST  06/04/2021 1014  ?  NONE ?Performed at [facility], [address] [place], [place] [address] ?  ? CULT  06/04/2021 1014  ?  NO GROWTH ?Performed at [facility], [place] [address] [place], [place] [address] ?  ? REPTSTATUS 06/05/2021 FINAL 06/04/2021 1014  ?  ?Other culture-see note  ?Radiology Studies: ?DG Chest Port 1 View ? ?Result Date: 08/16/2021 ?CLINICAL DATA:  Fever, overdose EXAM: PORTABLE CHEST 1 VIEW COMPARISON:  07/22/2021 FINDINGS: The heart size and  mediastinal contours are within normal limits. New heterogeneous airspace opacity of the peripheral left lung base. The visualized skeletal structures are unremarkable. IMPRESSION: New heterogeneous airspace opa

## 2021-08-18 NOTE — Progress Notes (Signed)
?   08/18/21 0023  ?Assess: MEWS Score  ?Temp (!) 101.6 ?F (38.7 ?C)  ?BP (!) 123/59  ?Pulse Rate 85  ?Resp 20  ?Level of Consciousness Alert  ?SpO2 98 %  ?O2 Device Room Air  ?Assess: MEWS Score  ?MEWS Temp 2  ?MEWS Systolic 0  ?MEWS Pulse 0  ?MEWS RR 0  ?MEWS LOC 0  ?MEWS Score 2  ?MEWS Score Color Yellow  ?Assess: if the MEWS score is Yellow or Red  ?Were vital signs taken at a resting state? Yes  ?Focused Assessment Change from prior assessment (see assessment flowsheet)  ?Does the patient meet 2 or more of the SIRS criteria? Yes  ?Does the patient have a confirmed or suspected source of infection? Yes  ?Provider and Rapid Response Notified? Yes  ?MEWS guidelines implemented *See Row Information* Yes  ?Treat  ?MEWS Interventions Administered prn meds/treatments  ?Take Vital Signs  ?Increase Vital Sign Frequency  Yellow: Q 2hr X 2 then Q 4hr X 2, if remains yellow, continue Q 4hrs  ?Escalate  ?MEWS: Escalate Yellow: discuss with charge nurse/RN and consider discussing with provider and RRT  ?Notify: Charge Nurse/RN  ?Name of Charge Nurse/RN Notified T Thurmond Butts  ?Date Charge Nurse/RN Notified 08/18/21  ?Time Charge Nurse/RN Notified [phone]  ?Notify: Provider  ?Provider Name/Title Olena Heckle  ?Date Provider Notified 08/18/21  ?Time Provider Notified [phone]  ?Notification Type Page  ?Notification Reason Change in status  ?Provider response No new orders  ?Date of Provider Response 08/18/21  ?Time of Provider Response 0020  ?Document  ?Patient Outcome Not stable and remains on department  ?Progress note created (see row info) Yes  ?Assess: SIRS CRITERIA  ?SIRS Temperature  1  ?SIRS Pulse 0  ?SIRS Respirations  0  ?SIRS WBC 0  ?SIRS Score Sum  1  ? ? ?

## 2021-08-18 NOTE — TOC Progression Note (Addendum)
Transition of Care (TOC) - Progression Note  ? ? ?Patient Details  ?Name: Anthony Lamb ?MRN: [id number] ?Date of Birth: May 19, 1956 ? ?Transition of Care (TOC) CM/SW Contact  ?[place], LCSW ?Phone Number: ?08/18/2021, 2:24 PM ? ?Clinical Narrative:    ? ?CSW received return call from [place], pt's care coordinator. She is agreeable to speak with pt about disposition. CSW meets pt in room and calls Pamala Hurry on speaker phone. She encourages pt to go to group home and explains reason's why it would be good for him. She is able to answer logistic questions regarding getting his belongings. She will call pt's cleaning person Catalina Antigua to help with his belongings. Catalina Antigua will come to the hospital and get patients apartment key and get patients belongings either prior to or after pt admits to group home. Pt is very anxious and hesitant when discussing DC to group home tomorrow. He tries to say he wants to admit over the weekend or on Monday instead. CSW explains that pt is medically ready and that hospital cannot hold him without a reason. He continues to be very anxious but ultimately agrees to DC to [place].  ? ?CSW called Jamas Lav at [facility] She confirms pt can admit tomorrow. CSW explained TB test will be read today. She requests new covid test; MD notified. Fl2 and DC summary will need to be sent to [email or web address]. She stated to call her tomorrow at [phone] to schedule pickup time.  ? ?Meds need to be sent to the following pharmacy: ? ?[facility] [address], [place], [place] [address] [phone] ? ?If for some reason pt does not admit tomorrow, [facility] can admit over the weekend but their pharmacy is closed on weekend so pt would need physical meds to get through the weekend. ? ?[facility] can be helpful in encouraging pt; she can be reached at [phone] ? ?Pt states he does not have a walker at home; CSW ordered walker from  [facility].  ? ?Expected Discharge Plan: Group Home ?Barriers to Discharge: Unsafe home situation ? ?Expected Discharge Plan and Services ?Expected Discharge Plan: Group Home ?  ?  ?  ?  ?                ?  ?  ?  ?  ?  ?  ?  ?  ?  ?  ? ? ?Social Determinants of Health (SDOH) Interventions ?  ? ?Readmission Risk Interventions ?No flowsheet data found. ? ?

## 2021-08-19 DIAGNOSIS — T43202A Poisoning by unspecified antidepressants, intentional self-harm, initial encounter: Secondary | ICD-10-CM | POA: Diagnosis not present

## 2021-08-19 LAB — BASIC METABOLIC PANEL
Anion gap: 10 (ref 5–15)
BUN: 12 mg/dL (ref 8–23)
CO2: 25 mmol/L (ref 22–32)
Calcium: 8.6 mg/dL — ABNORMAL LOW (ref 8.9–10.3)
Chloride: 96 mmol/L — ABNORMAL LOW (ref 98–111)
Creatinine, Ser: 0.69 mg/dL (ref 0.61–1.24)
GFR, Estimated: >60 mL/min (ref >60)
Glucose, Bld: 91 mg/dL (ref 70–99)
Potassium: 4.3 mmol/L (ref 3.5–5.1)
Sodium: 131 mmol/L — ABNORMAL LOW (ref 135–145)

## 2021-08-19 NOTE — Progress Notes (Signed)
?PROGRESS NOTE ?Anthony Lamb  IBB:[id number] DOB: 11/12/1955 DOA: 07/01/2021 ?PCP: Sandi Mariscal, MD  ? ?Brief Narrative/Hospital Course: ?66 year old man presented after an intentional overdose of duloxetine and admitted on 1/273 for suicidal drug overdose incidental Covid, treate,severe hyponatremia w/ history of SIADH, seen by nephrology and treated with hypertonic saline, it was felt to be from significant inappropriate ADH activation in the setting of duloxetine overdose likely in a patient with underlying mild hyponatremia from EtOH use. ?Hosp course listed as- admitted 127. On 1/28 started on Molnupiravir for incidental COVID- 1/29 transferred to stepdown unit for 3% hypertonic saline for severe hyponatremia, 2/4 nephrology signed off and 07/12/21 Deemed medically stable to transfer to inpatient psychiatric facility, 07/30/21  PICC line removed.Patient has been medically stable for discharge pending for inpatient psych. ?With further follow-up by psychiatry they have deemed IVC can be rescinded 08/12/21 and referred patient for assisted living. ?  ?Subjective: ? ?Seen and examined this morning.  Patient complains of being anxious reports she does not want to go to anywhere today. ?Last temperature spike was 3/16 at 1 a.m.and his antibiotics were changed  ?Some sore throat no chest pain not on oxygen ?Skin TB test was administered 3/14 evening for ALF placement and negative ? ?Assessment and Plan: ?Overdose of antidepressant, intentional self-harm, initial encounter ([place]) ?Suicidal attempt with overdose of antidepressant ?Major depression recurrent ?[place] or anxiolytic induced anxiety disorder: ?Presented with intentional overdose of duloxetine-now is stable cleared by psych.On chronic benzo and multiple suicide attempts of high lethality in the past.Initially on IVC-now rescinded.continue current benzodiazepine per psych-they have notified all parties involved in prescribing the med to pt- On abilify 2  mg qhs,Klonopin 0.5 daily,1 mg qhs,Remeron 7.5 mg qhs and inderal 20 mg Tid-for tremors,on  Ambien PRN, Zyprexa 2.5 bedtime. ?Cont PT/OT-will need ALF,unable to do self care.  TOC working on placement. ? 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 ?Fever/pneumonia: CXR 3/14 showed airspace opacity in the peripheral left lung he had  cough, leukocytosis. fever-so started on Unasyn 3/14.UA was unremarkable 3/14. With fever again 3/16 am-changed to vancomycin and Zosyn.  Chest x-ray 3/16 increased conspicuity about the lateral aspect of the left lower lobe focal opacification, no new or consolidation. Since 3/16 no recurrence of fever.  Continue current antibiotics once afebrile for next 24 hours hopefully can transition to oral antibiotics for discharge to assisted living facility.  Leukocytosis has resolved.  Not on oxygen.  Monitor.  ? ?GERD: Stable on PPI,carafete, simethicone, probiotics.  ? ?Tremor: Stable on stable Inderal as above. ? ?Alcohol abuse: Stable continue vitamins ? ?Ankylosing spondylitis Seen on x ray, known past history.  ? ?Hyponatremia ?SIADH:  ?Due to duloxetine overdose, managed with hypertonic saline initially sodium is stabilized -  nephrology signed off.  Monitor closely.  Holding at 128 to 130s. BMP in 1 week.  ?Recent Labs  ?Lab 08/13/21 ?1047 08/17/21 ?4503 08/18/21 ?0500 08/19/21 ?0450  ?NA 128* 128* 129* 131*  ? ?  ?Essential hypertension:Blood pressure somewhat soft again decrease lisinopril further to 10 mg ? ?Chest pain-resolved as of 08/04/2021 ?Not typical. Multiple EKG normal. No further evaluation. ? ?Allergic reaction-resolved as of 08/04/2021 ?unclear etiology. Occurred on 2/3. Resolved with Benadryl, Pepcid and Solu-medrol. ? ?COVID-19 virus infection-resolved as of 08/04/2021 ?Diagnosed on 07/01/21. Incidental. Treated with molnupiravir. Isolation discontinued. ? ?DVT prophylaxis: enoxaparin (LOVENOX) injection  40 mg Start: 07/01/21 2200 ?SCDs Start: 07/01/21 1108 ?Code Status:   Code  Status: Full Code ?Family Communication: plan of care discussed with patient/RN at bedside. ? ?Disposition: Currently is medically stable for discharge. ?Status is: Inpatient ?Remains inpatient appropriate because: Cleared by psychiatry, advised assisted living facility as patient will require supervision as not able to care for self and normal home environment. ?Awaiting for ALF placement, skin TB test ordered August 28, 2022 is negative.  Given his pneumonia fever keeping on IV antibiotics hopefully we can de-escalate to oral antibiotics for discharge to ALF soon ? ?Objective: ?Vitals last 24 hrs: ?Vitals:  ? 08/18/21 1944 08/18/21 2101 08/19/21 0418 08/19/21 0800  ?BP: 124/65 113/75 (!) 105/56   ?Pulse: 69 67 79   ?Resp: 18  19   ?Temp: 97.8 ?F (36.6 ?C)  98.6 ?F (37 ?C)   ?TempSrc: Oral  Oral   ?SpO2: 100%  97%   ?Weight:    68.2 kg  ?Height:      ? ?Weight change: 1.8 kg ? ?Physical Examination: ?General exam: AAOX3,older than stated age, weak appearing. ?HEENT:Oral mucosa moist, Ear/Nose WNL grossly, dentition normal. ?Respiratory system: bilaterally CLEAR,no use of accessory muscle ?Cardiovascular system: S1 & S2 +, No JVD,. ?Gastrointestinal system: Abdomen soft,NT,ND, BS+ ?Nervous System:Alert, awake, moving extremities and grossly nonfocal ?Extremities: edema neg,distal peripheral pulses palpable.  ?Skin: No rashes,no icterus. ?MSK: Normal muscle bulk,tone, power ? ?Medications reviewed:  ?Scheduled Meds: ? clonazePAM  0.25 mg Oral q morning  ? And  ? clonazePAM  1 mg Oral QHS  ? enoxaparin (LOVENOX) injection  40 mg Subcutaneous Q24H  ? feeding supplement  237 mL Oral TID BM  ? fluticasone  1 spray Each Nare Daily  ? lisinopril  20 mg Oral Daily  ? loratadine  10 mg Oral Daily  ? mouth rinse  15 mL Mouth Rinse BID  ? mirtazapine  15 mg Oral QHS  ? pantoprazole  40 mg Oral BID  ? potassium chloride  20 mEq Oral BID  ? propranolol  20 mg Oral  TID  ? QUEtiapine  50 mg Oral QHS  ? saccharomyces boulardii  250 mg Oral BID  ? simethicone  80 mg Oral QID  ? sucralfate  1 g Oral QID  ? zolpidem  5 mg Oral QHS  ? ?Continuous Infusions: ? sodium chloride 10 mL/hr at 07/23/21 0600  ? piperacillin-tazobactam (ZOSYN)  IV 3.375 g (08/19/21 0225)  ? vancomycin 1,000 mg (08/19/21 0630)  ?Data Reviewed: I have personally reviewed following labs and imaging studies ?CBC: ?Recent Labs  ?Lab 08/27/21 ?1031 08/17/21 ?4008  ?WBC 15.1* 10.3  ?HGB 10.9* 9.7*  ?HCT 32.9* 29.0*  ?MCV 94.5 94.8  ?PLT 290 241  ? ? ?Basic Metabolic Panel: ?Recent Labs  ?Lab 08/13/21 ?1047 08/17/21 ?6761 08/18/21 ?0500 08/19/21 ?0450  ?NA 128* 128* 129* 131*  ?K 4.3 4.0 3.9 4.3  ?CL 92* 95* 95* 96*  ?CO2 29 24 27 25   ?GLUCOSE 81 96 101* 91  ?BUN 14 11 10 12   ?CREATININE 0.65 0.58* 0.68 0.69  ?CALCIUM 8.5* 8.5* 8.3* 8.6*  ? ?Antimicrobials: ?Anti-infectives (From admission, onward)  ? ? Start     Dose/Rate Route Frequency Ordered Stop  ? 08/19/21 0400  vancomycin (VANCOCIN) IVPB 1000 mg/200 mL premix       ? 1,000 mg ?200 mL/hr over 60 Minutes Intravenous Every 12 hours 08/18/21 1455    ? 08/18/21 1545  piperacillin-tazobactam (ZOSYN) IVPB 3.375 g       ? 3.375 g ?12.5 mL/hr over 240 Minutes Intravenous Every 8 hours 08/18/21  1455    ? 08/18/21 1545  vancomycin (VANCOREADY) IVPB 1250 mg/250 mL       ? 1,250 mg ?166.7 mL/hr over 90 Minutes Intravenous NOW 08/18/21 1455 08/18/21 1755  ? 08/16/21 1200  Ampicillin-Sulbactam (UNASYN) 3 g in sodium chloride 0.9 % 100 mL IVPB  Status:  Discontinued       ? 3 g ?200 mL/hr over 30 Minutes Intravenous Every 6 hours 08/16/21 1115 08/18/21 1438  ? 07/01/21 2200  nirmatrelvir/ritonavir EUA (PAXLOVID) 3 tablet  Status:  Discontinued       ? 3 tablet Oral 2 times daily 07/01/21 1900 07/01/21 1935  ? 07/01/21 2200  molnupiravir EUA (LAGEVRIO) capsule 800 mg       ? 4 capsule Oral 2 times daily 07/01/21 1935 07/06/21 2159  ? ?  ? ?Culture/Microbiology ?   ?Component  Value Date/Time  ? SDES  06/04/2021 1014  ?  URINE, CLEAN CATCH ?Performed at [facility], [address] [place], [place] [address] ?  ? SPECREQUEST  06/04/2021 1014  ?  NONE

## 2021-08-19 NOTE — Plan of Care (Signed)
?  Problem: Coping: ?Goal: Ability to identify and develop effective coping behavior will improve ?Outcome: Progressing ?Goal: Ability to interact with others will improve ?Outcome: Progressing ?Goal: Demonstration of participation in decision-making regarding own care will improve ?Outcome: Progressing ?Goal: Ability to use eye contact when communicating with others will improve ?Outcome: Progressing ?  ?Problem: Pain Managment: ?Goal: General experience of comfort will improve ?Outcome: Progressing ?  ?

## 2021-08-19 NOTE — Progress Notes (Signed)
Pharmacy Antibiotic Note ? ?Anthony Lamb is a 66 y.o. male with hx of alcohol abuse, anxiety, depression, and chronic pain who was admitted on 07/01/2021 after overdosing himself with 60 tablets of duloxetine 60 mg and presented with anxiety and tremor. Pt was treated with Molnupiravir for incidental COVID during 1/28 to 2/1. CXR 3/14 showed airspace opacity in the peripheral left lung with sx of cough, leukocytosis, and fever. Pt was started on Unasyn on 3/14 but had fever again on 3/16 am with abx changed to Zosyn and Vanc. ? ?Today, 08/19/21 ?- Afebrile (98.6) ?- Last WBC 10.3 on 3/15 ?- SrCr 0.69 ?- Procalcitonin <0.1 on 3/16 ?- Total abx Day#4, Day#2 Vanc/Zosyn ? ?Plan: ?- Vancomycin 1250 mg on 3/16 for 1 and then 1000 mg IV every 12 hours for estimated AUC 506 ?- Zosyn 3.375g q8h over 4 hours ? ? ?Height: 5' 10"  (177.8 cm) ?Weight: 69.3 kg (152 lb 12.5 oz) ?IBW/kg (Calculated) : 73 ? ?Temp (24hrs), Avg:98.1 ?F (36.7 ?C), Min:97.8 ?F (36.6 ?C), Max:98.6 ?F (37 ?C) ? ?Recent Labs  ?Lab 08/13/21 ?1047 08/16/21 ?1031 08/17/21 ?3570 08/18/21 ?0500 08/19/21 ?0450  ?WBC  --  15.1* 10.3  --   --   ?CREATININE 0.65  --  0.58* 0.68 0.69  ?  ?Estimated Creatinine Clearance: 90.2 mL/min (by C-G formula based on SCr of 0.69 mg/dL).   ? ?Allergies  ?Allergen Reactions  ? Nsaids Other (See Comments)  ?  GI upset- history of peptic ulcers!!  ? Diphenhydramine Hcl Other (See Comments)  ?  Restlessness  ? Flexeril [Cyclobenzaprine] Other (See Comments)  ?  Restlessness  ? Fluoxetine Other (See Comments)  ?  Made the patient feel "worse than before" (treatment)  ? Hctz [Hydrochlorothiazide] Other (See Comments)  ?  Caused to lose sodium when taking with Lisinopril  ? Rexulti [Brexpiprazole] Other (See Comments)  ?  "Made me not feel right- restless"  ? Seroquel [Quetiapine] Other (See Comments)  ?  Restless legs and makes the patient sweat- also does not help patient's insomnia. Pt takes this at home in 2023.  ? Gabapentin  Diarrhea  ? Hydroxyzine Other (See Comments)  ?  Restlessness  ? ? ? ?Thank you for allowing pharmacy to be a part of this patient?s care. ? ?Thea Alken, PharmD Candidate ?08/19/2021 8:44 AM ? ?

## 2021-08-19 NOTE — Progress Notes (Signed)
Patient hyper-fixated on being dehydrated and requesting labwork be done because he knows that his electrolytes are off because "I am seeing flashing lights when I close my eyes". 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 Offered reassurance to patient and will continue to explain and educate as needed. ? ? ? ?

## 2021-08-19 NOTE — TOC Progression Note (Signed)
Transition of Care (TOC) - Progression Note  ? ? ?Patient Details  ?Name: REGIS HINTON ?MRN: [id number] ?Date of Birth: 1956/01/28 ? ?Transition of Care (TOC) CM/SW Contact  ?Ross Ludwig, LCSW ?Phone Number: ?08/19/2021, 4:41 PM ? ?Clinical Narrative:    ? ?CSW contacted Jamas Lav from [employer] at [phone].  CSW informed her that patient is being treated with IV antibiotic today, but should be ready tomorrow.  She said patient can come tomorrow and asked to email the DC summary and FL2 to [email or web address].  Patient's medications will have to be provided from the [facility] tomorrow.  ? ? ?Expected Discharge Plan: Group Home ?Barriers to Discharge: Unsafe home situation ? ?Expected Discharge Plan and Services ?Expected Discharge Plan: Group Home ?  ?  ?  ?  ?                ?  ?  ?  ?  ?  ?  ?  ?  ?  ?  ? ? ?Social Determinants of Health (SDOH) Interventions ?  ? ?Readmission Risk Interventions ?No flowsheet data found. ? ?

## 2021-08-20 DIAGNOSIS — T43202A Poisoning by unspecified antidepressants, intentional self-harm, initial encounter: Secondary | ICD-10-CM | POA: Diagnosis not present

## 2021-08-20 LAB — CREATININE, SERUM
Creatinine, Ser: 0.8 mg/dL (ref 0.61–1.24)
GFR, Estimated: >60 mL/min (ref >60)

## 2021-08-20 NOTE — Progress Notes (Signed)
?[facility] Progress Note ? ?Patient: Anthony Lamb     ?EAV:[id number]  ?DOA: 07/01/2021   ?PCP: Sandi Mariscal, MD  ? ?  ?  ?Brief hospital course: ?66 year old man with history of alcohol abuse, anxiety and depression, SIADH presented to the ED after taking 60 tablets of duloxetine in a suicide attempt.  He has had multiple ED visits for anxiety and suicidal ideation in addition to other issues. ?He was found to be hyponatremic with a sodium of about 121.  Nephrology was consulted to help assist with management, he was placed on 3% saline and sodium has steadily improved into the 130s. ?He was also found to have asymptomatic COVID and started on molnupiravir. ?Psych has evaluated the patient.  He was initially involuntarily committed and is now deemed to be stable for discharge. ? ?Subjective:  ?Patient is very anxious today.  He is perseverating on having sweats overnight, coughing up blood, being short of breath.  He is anxious and repeatedly asking for "1 dose of diazepam".  He continues to be afraid of discharge. 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   ?Pneumonia ?- fever, leukocytosis, cough, CXR wit LLL opacity ?- 31/4- Unasyn started ?- 3/16 changed to Vanc and Zosyn as had recurrent fever of 101 ?- having some hemoptysis now and still having sweats ?- cont IV abx ? ?Gastroesophageal reflux disease ?- receiving BID PPI ,carafete, simethicone, probiotics.  ?  ?Alcohol abuse ? ?SIADH (syndrome of inappropriate ADH production) ([place]) ?Due to duloxetine overdose, managed with hypertonic saline now sodium is  stable and improved fluctuating 128-130.  BMP in 1 week.  Nephrology signed off. ?Recent Labs  ?Lab 08/09/21 ?0532 08/13/21 ?1047  ?NA 130* 128*  ? ? ?Essential hypertension ?BP well controlled, on lisinopril 30 mg and also getting Inderal for tremors  ? ?Chest pain-resolved as of 08/04/2021 ?Not typical. Multiple EKG normal. No further evaluation. ? ?Allergic reaction-resolved as of 08/04/2021 ?unclear etiology. Occurred on 2/3. Resolved with Benadryl, Pepcid and Solu-medrol. ? ?COVID-19 virus infection-resolved as of 08/04/2021 ?Diagnosed on 07/01/21. Incidental. Treated with molnupiravir. Isolation discontinued. ? ? ? ? ? ?DVT prophylaxis:  enoxaparin (LOVENOX) injection 40 mg Start: 07/01/21 2200 ?SCDs Start: 07/01/21 1108 ? ?  Code Status: Full Code  ?Level of Care: Level of care: Med-Surg ?Disposition Plan:  ?Status is: Inpatient ?Remains inpatient appropriate because: IV antibiotics ? ?Objective: ?  ?Vitals:  ? 08/19/21 1459 08/19/21 2100 08/20/21 0300 08/20/21 1344  ?BP: 123/65 117/69 (!) 101/53 130/68  ?Pulse: 73 73 68 66  ?Resp: 16 18 18 16   ?Temp: 98.5 ?F (36.9 ?C) 98.2 ?F (36.8 ?C) 97.7 ?F (36.5 ?C) 98.6 ?F (37 ?C)  ?TempSrc: Oral Oral Oral   ?SpO2: 100% 97% 98% 100%  ?Weight:      ?Height:      ? ?Filed Weights  ? 08/17/21 0800 08/18/21 0800 08/19/21 0800  ?Weight: 67.5 kg 69.3 kg 68.2 kg  ? ?Exam: ?General exam: Appears uncomfortable  ?HEENT: PERRLA, oral mucosa moist, no sclera icterus or thrush ?Respiratory system: Clear to auscultation. Respiratory effort normal. ?Cardiovascular system: S1 & S2 heard, regular rate and rhythm ?Gastrointestinal system: Abdomen soft, non-tender, nondistended. Normal bowel sounds   ?  Central nervous system: Alert and oriented. No focal neurological deficits. ?Extremities: No cyanosis, clubbing or edema ?Skin: No rashes or ulcers ?Psychiatry:  severe anxiety  ? ?Imaging and lab data was personally reviewed ? ? ? CBC: ?Recent Labs  ?Lab 08/16/21 ?1031 08/17/21 ?6283  ?WBC 15.1*  10.3  ?HGB 10.9* 9.7*  ?HCT 32.9* 29.0*  ?MCV 94.5 94.8  ?PLT 290 241  ? ?Basic Metabolic Panel: ?Recent Labs  ?Lab 08/17/21 ?1517 08/18/21 ?0500 08/19/21 ?0450 08/20/21 ?0444  ?NA 128* 129* 131*  --   ?K 4.0 3.9 4.3  --   ?CL 95* 95* 96*  --   ?CO2 24 27 25   --   ?GLUCOSE 96 101* 91  --   ?BUN 11 10 12   --   ?CREATININE 0.58* 0.68 0.69 0.80  ?CALCIUM 8.5* 8.3* 8.6*  --   ? ?GFR: ?Estimated Creatinine Clearance: 88.8 mL/min (by C-G formula based on SCr of 0.8 mg/dL). ? ?Scheduled Meds: ? clonazePAM  0.25 mg Oral q morning  ? And  ? clonazePAM  1 mg Oral QHS  ? enoxaparin (LOVENOX) injection  40 mg Subcutaneous Q24H  ? feeding supplement  237 mL Oral TID BM  ? fluticasone  1 spray Each Nare Daily  ? lisinopril  20 mg Oral Daily  ? loratadine  10 mg Oral Daily  ? mouth rinse  15 mL Mouth Rinse BID  ? mirtazapine  15 mg Oral QHS  ? pantoprazole  40 mg Oral BID  ? potassium chloride  20 mEq Oral BID  ? propranolol  20 mg Oral TID  ? QUEtiapine  50 mg Oral QHS  ? saccharomyces boulardii  250 mg Oral BID  ? simethicone  80 mg Oral QID  ? sucralfate  1 g Oral QID  ? zolpidem  5 mg Oral QHS  ? ?Continuous Infusions: ? sodium chloride 10 mL/hr at 07/23/21 0600  ? piperacillin-tazobactam (ZOSYN)  IV 3.375 g (08/20/21 0936)  ? vancomycin 1,000 mg (08/20/21 6160)  ? ? ? LOS: 49 days  ? ?Author: ?Debbe Odea  ?08/20/2021 2:50 PM ?   ?

## 2021-08-21 DIAGNOSIS — T43202A Poisoning by unspecified antidepressants, intentional self-harm, initial encounter: Secondary | ICD-10-CM | POA: Diagnosis not present

## 2021-08-21 LAB — CREATININE, SERUM
Creatinine, Ser: 0.75 mg/dL (ref 0.61–1.24)
GFR, Estimated: >60 mL/min (ref >60)

## 2021-08-21 NOTE — Progress Notes (Signed)
?[facility] Progress Note ? ?Patient: Anthony Lamb     ?ATF:[id number]  ?DOA: 07/01/2021   ?PCP: Sandi Mariscal, MD  ? ?  ?  ?Brief hospital course: ?66 year old man with history of alcohol abuse, anxiety and depression, SIADH presented to the ED after taking 60 tablets of duloxetine in a suicide attempt.  He has had multiple ED visits for anxiety and suicidal ideation in addition to other issues. ?He was found to be hyponatremic with a sodium of about 121.  Nephrology was consulted to help assist with management, he was placed on 3% saline and sodium has steadily improved into the 130s. ?He was also found to have asymptomatic COVID and started on molnupiravir. ?Psych has evaluated the patient.  He was initially involuntarily committed and is now deemed to be stable for discharge. ? ?Subjective:  ?States he is still coughing up blood.  ?Assessment and Plan: ? 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   ?Pneumonia ?- fever, leukocytosis, cough, CXR wit LLL opacity ?- 31/4- Unasyn started ?- 3/16 changed to Vanc and Zosyn as had recurrent fever of 101 ?- having some hemoptysis now ?- cont IV abx ? ?Gastroesophageal reflux disease ?- receiving BID PPI ,carafete, simethicone, probiotics.  ?  ?Alcohol abuse ? ?SIADH (syndrome of inappropriate ADH production) ([place]) ?Due to duloxetine overdose, managed with hypertonic saline now sodium is stable and improved fluctuating 128-130.  BMP in 1 week.  Nephrology signed off. ?Recent Labs  ?Lab 08/09/21 ?0532 08/13/21 ?1047  ?NA 130* 128*  ? ? ?Essential hypertension ?BP well controlled, on lisinopril 30 mg and also getting  Inderal for tremors  ? ?Chest pain-resolved as of 08/04/2021 ?Not typical. Multiple EKG normal. No further evaluation. ? ?Allergic reaction-resolved as of 08/04/2021 ?unclear etiology. Occurred on 2/3. Resolved with Benadryl, Pepcid and Solu-medrol. ? ?COVID-19 virus infection-resolved as of 08/04/2021 ?Diagnosed on 07/01/21. Incidental. Treated with molnupiravir. Isolation discontinued. ? ? ? ? ? ?DVT prophylaxis:  enoxaparin (LOVENOX) injection 40 mg Start: 07/01/21 2200 ?SCDs Start: 07/01/21 1108 ? ?  Code Status: Full Code  ?Level of Care: Level of care: Med-Surg ?Disposition Plan:  ?Status is: Inpatient ?Remains inpatient appropriate because: IV antibiotics ? ?Objective: ?  ?Vitals:  ? 08/20/21 0300 08/20/21 1344 08/20/21 1940 08/21/21 0450  ?BP: (!) 101/53 130/68 128/68 105/60  ?Pulse: 68 66 62 64  ?Resp: 18 16 18 18   ?Temp: 97.7 ?F (36.5 ?C) 98.6 ?F (37 ?C) (!) 97.5 ?F (36.4 ?C) 98.4 ?F (36.9 ?C)  ?TempSrc: Oral  Oral Oral  ?SpO2: 98% 100% 100% 97%  ?Weight:      ?Height:      ? ?Filed Weights  ? 08/17/21 0800 08/18/21 0800 08/19/21 0800  ?Weight: 67.5 kg 69.3 kg 68.2 kg  ? ?Exam: ?General exam: Appears comfortable  ?HEENT: PERRLA, oral mucosa moist, no sclera icterus or thrush ?Respiratory system: crackles in LLL ?Cardiovascular system: S1 & S2 heard, regular rate and rhythm ?Gastrointestinal system: Abdomen soft, non-tender, nondistended. Normal bowel sounds   ?Central nervous system: Alert and oriented. No focal neurological deficits. ?Extremities: No cyanosis, clubbing or edema ?Skin: No rashes or ulcers ?Psychiatry:  remains anxious   ? ?Imaging and lab data was personally reviewed ? ? ? CBC: ?  Recent Labs  ?Lab 08/16/21 ?1031 08/17/21 ?4599  ?WBC 15.1* 10.3  ?HGB 10.9* 9.7*  ?HCT 32.9* 29.0*  ?MCV 94.5 94.8  ?PLT 290 241  ? ? ?Basic Metabolic Panel: ?Recent Labs  ?Lab 08/17/21 ?7741 08/18/21 ?0500 08/19/21 ?0450 08/20/21 ?0444 08/21/21 ?0515  ?NA 128* 129* 131*  --   --   ?K 4.0 3.9 4.3  --   --   ?CL 95* 95*  96*  --   --   ?CO2 24 27 25   --   --   ?GLUCOSE 96 101* 91  --   --   ?BUN 11 10 12   --   --   ?CREATININE 0.58* 0.68 0.69 0.80 0.75  ?CALCIUM 8.5* 8.3* 8.6*  --   --   ? ? ?GFR: ?Estimated Creatinine Clearance: 88.8 mL/min (by C-G formula based on SCr of 0.75 mg/dL). ? ?Scheduled Meds: ? clonazePAM  0.25 mg Oral q morning  ? And  ? clonazePAM  1 mg Oral QHS  ? enoxaparin (LOVENOX) injection  40 mg Subcutaneous Q24H  ? feeding supplement  237 mL Oral TID BM  ? fluticasone  1 spray Each Nare Daily  ? lisinopril  20 mg Oral Daily  ? loratadine  10 mg Oral Daily  ? mouth rinse  15 mL Mouth Rinse BID  ? mirtazapine  15 mg Oral QHS  ? pantoprazole  40 mg Oral BID  ? potassium chloride  20 mEq Oral BID  ? propranolol  20 mg Oral TID  ? QUEtiapine  50 mg Oral QHS  ? saccharomyces boulardii  250 mg Oral BID  ? simethicone  80 mg Oral QID  ? sucralfate  1 g Oral QID  ? zolpidem  5 mg Oral QHS  ? ?Continuous Infusions: ? sodium chloride 10 mL/hr at 07/23/21 0600  ? piperacillin-tazobactam (ZOSYN)  IV 3.375 g (08/21/21 1000)  ? vancomycin 1,000 mg (08/21/21 0501)  ? ? ? LOS: 50 days  ? ?Author: ?Debbe Odea  ?08/21/2021 3:20 PM ?   ?

## 2021-08-22 ENCOUNTER — Other Ambulatory Visit (HOSPITAL_COMMUNITY): Payer: Self-pay

## 2021-08-22 DIAGNOSIS — J181 Lobar pneumonia, unspecified organism: Secondary | ICD-10-CM | POA: Diagnosis not present

## 2021-08-22 DIAGNOSIS — T43202A Poisoning by unspecified antidepressants, intentional self-harm, initial encounter: Secondary | ICD-10-CM | POA: Diagnosis not present

## 2021-08-22 DIAGNOSIS — F1010 Alcohol abuse, uncomplicated: Secondary | ICD-10-CM | POA: Diagnosis not present

## 2021-08-22 LAB — CREATININE, SERUM
Creatinine, Ser: 0.82 mg/dL (ref 0.61–1.24)
GFR, Estimated: >60 mL/min (ref >60)

## 2021-08-22 MED ORDER — DOXYCYCLINE MONOHYDRATE 100 MG PO TABS: 100.0000 mg | ORAL_TABLET | Freq: Two times a day (BID) | ORAL | 0 refills | Status: AC

## 2021-08-22 MED ORDER — CLONAZEPAM 1 MG PO TABS: 1.0000 mg | ORAL_TABLET | Freq: Every day | ORAL | 1 refills | Status: DC

## 2021-08-22 MED ORDER — QUETIAPINE FUMARATE 50 MG PO TABS: 50.0000 mg | ORAL_TABLET | Freq: Every day | ORAL | 1 refills | Status: AC

## 2021-08-22 MED ORDER — LEVOFLOXACIN 750 MG PO TABS: 750.0000 mg | ORAL_TABLET | Freq: Every day | ORAL | 0 refills | Status: AC

## 2021-08-22 MED ORDER — CLONAZEPAM 0.5 MG PO TABS: 0.2500 mg | ORAL_TABLET | Freq: Every morning | ORAL | 1 refills | Status: DC

## 2021-08-22 MED ORDER — CLONAZEPAM 0.5 MG PO TABS
0.2500 mg | ORAL_TABLET | Freq: Every morning | ORAL | 1 refills | Status: DC
Start: 2021-08-23 — End: 2021-08-22

## 2021-08-22 MED ORDER — ZOLPIDEM TARTRATE 5 MG PO TABS: 5.0000 mg | ORAL_TABLET | Freq: Every day | ORAL | 0 refills | Status: DC

## 2021-08-22 MED ORDER — PROPRANOLOL HCL 20 MG PO TABS: 20.0000 mg | ORAL_TABLET | Freq: Three times a day (TID) | ORAL | 1 refills | Status: DC

## 2021-08-22 MED ORDER — QUETIAPINE FUMARATE 50 MG PO TABS
50.0000 mg | ORAL_TABLET | Freq: Every day | ORAL | 1 refills | Status: DC
  Filled 2021-08-22: qty 30, 30d supply, fill #0

## 2021-08-22 MED ORDER — MIRTAZAPINE 15 MG PO TABS: 15.0000 mg | ORAL_TABLET | Freq: Every day | ORAL | 1 refills | Status: AC

## 2021-08-22 MED ORDER — MIRTAZAPINE 15 MG PO TABS
15.0000 mg | ORAL_TABLET | Freq: Every day | ORAL | 1 refills | Status: DC
  Filled 2021-08-22: qty 30, 30d supply, fill #0

## 2021-08-22 MED ORDER — PROPRANOLOL HCL 20 MG PO TABS: 20.0000 mg | ORAL_TABLET | Freq: Three times a day (TID) | ORAL | 1 refills | Status: AC

## 2021-08-22 MED ORDER — ENSURE ENLIVE PO LIQD: 237.0000 mL | Freq: Three times a day (TID) | ORAL | 1 refills | Status: AC

## 2021-08-22 MED ORDER — LEVOFLOXACIN 750 MG PO TABS
750.0000 mg | ORAL_TABLET | Freq: Every day | ORAL | Status: DC
  Administered 2021-08-22: 750 mg via ORAL
  Filled 2021-08-22: qty 1

## 2021-08-22 MED ORDER — PROPRANOLOL HCL 20 MG PO TABS
20.0000 mg | ORAL_TABLET | Freq: Three times a day (TID) | ORAL | 1 refills | Status: DC
  Filled 2021-08-22: qty 90, 30d supply, fill #0

## 2021-08-22 MED ORDER — OXYCODONE HCL 5 MG PO TABS: 5.0000 mg | ORAL_TABLET | Freq: Every day | ORAL | 0 refills | Status: DC | PRN

## 2021-08-22 MED ORDER — QUETIAPINE FUMARATE 50 MG PO TABS: 50.0000 mg | ORAL_TABLET | Freq: Every day | ORAL | 1 refills | Status: DC

## 2021-08-22 MED ORDER — ZOLPIDEM TARTRATE 5 MG PO TABS: 5.0000 mg | ORAL_TABLET | Freq: Every day | ORAL | 0 refills | Status: AC

## 2021-08-22 NOTE — Discharge Summary (Signed)
Physician Discharge Summary  ?CLEVESTER HELZER NIO:[id number] DOB: Sep 13, 1955 DOA: 07/01/2021 ? ?PCP: Sandi Mariscal, MD ? ?Admit date: 07/01/2021 ?Discharge date: 08/22/2021 ?Discharging to: Group home ?Recommendations for Outpatient Follow-up:  ?Oxycodone weaned to 5 mg daily PRN - have only given 10 tabs as I don't expect this to be continued for long term ?Please continue to taper off Benzodiazepines- last change was made by psych on 2/15 ?Repeat Bmet 1 in week to follow on sodium ?PCP f/u  in 2 wks please ?F/u BP as outpt  ?Outp follow up with psych is imperitive  ? ?Consults:  ?psych ?  ? ? ?Discharge Diagnoses:  ? Principal Problem: ?  Overdose of antidepressant, intentional self-harm, initial encounter ([place]) ?Active Problems: ?Lobar Pneumonia  ?  Suicide attempt [facility]) ?  Major depression, recurrent ([place]) ?  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   Alcohol abuse ?  Tremor ?  ? ? ? ? ?Hospital Course: ?Brief hospital course: ?66 year old man with history of alcohol abuse, anxiety and depression, SIADH presented to the ED after taking 60 tablets of duloxetine in a suicide attempt.  He has had multiple ED visits for anxiety and suicidal ideation in addition to other issues. ?He was found to be hyponatremic with a sodium of about 121.  Nephrology was consulted to help assist with management, he was placed on 3% saline and sodium has steadily improved into the 130s. ?He was also found to have asymptomatic COVID and started on molnupiravir. ?Psych has evaluated the patient.  He was initially involuntarily committed and is now deemed to be stable for discharge. ?This patient has had a prolonged hospital stay. I recently became his attending.  ?  ? ?Assessment and Plan: ?* Overdose of antidepressant, intentional self-harm, initial encounter ([place]) ?Major depression recurrent ?Insomnia ?-Appreciate psych assistance ?- Clonazepam is being steadily tapered  ?-  current medications: Mirtazapine 15 mg nightly,Seroquel 50 mg nightly ?   ?Pneumonia ?- fever, leukocytosis, cough, CXR wit LLL opacity ?- 31/4- Unasyn started ?- 3/16 changed to Vanc and Zosyn as had recurrent fever of 101 ?- had some hemoptysis subsequently ?- has had 5 days of IV antibiotics- being switched to oral for 2 more days ? ?Gastroesophageal reflux disease ?- receiving BID PPI ,carafete, simethicone, probiotics.  ?  ?Alcohol abuse ?  ?SIADH (syndrome of inappropriate ADH production) ([place]) ?- was suspected to be due to duloxetine overdose ?- nephrology consulted ?- managed with hypertonic saline  ?- now ranging from high 120-low 130s- recommend intermittent f/u as outpt ? ?Tremors ?- on 20 Propranolol TID for these for about 1 month now ?- no tremors on my exam ?  ?Essential hypertension? ?SBP  ~ 100   ?- have stopped Lisinopril ?- on Propranol as mentioned above which is likely controlling BP ?  ?Chest pain-resolved as of 08/04/2021 ?Not typical. Multiple EKG normal. No further evaluation. ?  ?Allergic reaction-resolved as of 08/04/2021 ?unclear etiology. Occurred on 2/3. Resolved with Benadryl, Pepcid and Solu-medrol. ?  ?COVID-19 virus infection-resolved as of 08/04/2021 ?Diagnosed on 07/01/21. Incidental. Treated with molnupiravir. Isolation discontinued. ?  ?  ?  ? ?Discharge Instructions ? ?Discharge Instructions   ? ? Diet general   Complete by: As directed ?  ? Regular diet  ? Increase activity slowly   Complete by: As directed ?  ? ?  ? ?Allergies as of 08/22/2021   ? ?   Reactions  ? Nsaids Other (See Comments)  ?  GI upset- history of peptic ulcers!!  ? Diphenhydramine Hcl Other (See Comments)  ? Restlessness  ? Flexeril [cyclobenzaprine] Other (See Comments)  ? Restlessness  ? Fluoxetine Other (See Comments)  ? Made the patient feel "worse than before" (treatment)  ? Hctz [hydrochlorothiazide] Other (See Comments)  ? Caused to lose sodium when taking with Lisinopril  ? Rexulti [brexpiprazole] Other  (See Comments)  ? "Made me not feel right- restless"  ? Seroquel [quetiapine] Other (See Comments)  ? Restless legs and makes the patient sweat- also does not help patient's insomnia. Pt takes this at home in 2023.  ? Gabapentin Diarrhea  ? Hydroxyzine Other (See Comments)  ? Restlessness  ? ?  ? ?  ?Medication List  ?  ? ?STOP taking these medications   ? ?diazepam 5 MG tablet ?Commonly known as: VALIUM ?  ?DULoxetine 30 MG capsule ?Commonly known as: CYMBALTA ?  ?FLUoxetine 20 MG capsule ?Commonly known as: PROZAC ?  ?fluticasone 50 MCG/ACT nasal spray ?Commonly known as: FLONASE ?  ?lisinopril 10 MG tablet ?Commonly known as: ZESTRIL ?  ?LORazepam 1 MG tablet ?Commonly known as: ATIVAN ?  ?risperiDONE 0.5 MG tablet ?Commonly known as: RISPERDAL ?  ?tiZANidine 4 MG tablet ?Commonly known as: ZANAFLEX ?  ?traZODone 50 MG tablet ?Commonly known as: DESYREL ?  ?verapamil 240 MG CR tablet ?Commonly known as: CALAN-SR ?  ? ?  ? ?TAKE these medications   ? ?acetaminophen 500 MG tablet ?Commonly known as: TYLENOL ?Take 500 mg by mouth every 6 (six) hours as needed for mild pain. ?  ?clonazePAM 1 MG tablet ?Commonly known as: KLONOPIN ?Take 1 tablet (1 mg total) by mouth at bedtime. ?  ?clonazePAM 0.5 MG tablet ?Commonly known as: KLONOPIN ?Take 0.5 tablets (0.25 mg total) by mouth every morning. ?Start taking on: August 23, 2021 ?  ?doxycycline 100 MG tablet ?Commonly known as: ADOXA ?Take 1 tablet (100 mg total) by mouth 2 (two) times daily for 4 days. ?  ?feeding supplement Liqd ?Take 237 mLs by mouth 3 (three) times daily between meals. ?  ?levofloxacin 750 MG tablet ?Commonly known as: LEVAQUIN ?Take 1 tablet (750 mg total) by mouth daily for 2 days. ?Start taking on: August 23, 2021 ?  ?mirtazapine 15 MG tablet ?Commonly known as: REMERON ?Take 1 tablet (15 mg total) by mouth at bedtime. ?  ?multivitamin with minerals Tabs tablet ?Take 1 tablet by mouth daily. (May buy over the counter) ?What changed: additional  instructions ?  ?oxyCODONE 5 MG immediate release tablet ?Commonly known as: Oxy IR/ROXICODONE ?Take 1 tablet (5 mg total) by mouth daily as needed for severe pain or breakthrough pain. ?  ?pantoprazole 40 MG tablet ?Commonly known as: PROTONIX ?Take 1 tablet (40 mg total) by mouth daily. ?What changed: when to take this ?  ?propranolol 20 MG tablet ?Commonly known as: INDERAL ?Take 1 tablet (20 mg total) by mouth 3 (three) times daily. ?What changed:  ?medication strength ?how much to take ?when to take this ?  ?QUEtiapine 50 MG tablet ?Commonly known as: SEROQUEL ?Take 1 tablet (50 mg total) by mouth at bedtime. ?What changed:  ?medication strength ?how much to take ?  ?SALINE NASAL SPRAY NA ?Place 1 spray into both nostrils daily. ?  ?sucralfate 1 g tablet ?Commonly known as: CARAFATE ?Take 1 g by mouth 4 (four) times daily. ?  ?zolpidem 5 MG tablet ?Commonly known as: AMBIEN ?Take 1 tablet (5 mg total) by mouth at bedtime. ?  What changed:  ?medication strength ?how much to take ?  ? ?  ? ?  ?  ? ? ?  ?Durable Medical Equipment  ?(From admission, onward)  ?  ? ? ?  ? ?  Start     Ordered  ? 08/18/21 1517  For home use only DME Walker  Once       ?Question:  Patient needs a walker to treat with the following condition  Answer:  Physical deconditioning  ? 08/18/21 1517  ? ?  ?  ? ?  ? ? ?  ?  ?The results of significant diagnostics from this hospitalization (including imaging, microbiology, ancillary and laboratory) are listed below for reference.   ? ?DG Chest 2 View ? ?Result Date: 08/18/2021 ?CLINICAL DATA:  66 year old male with history of pneumonia, follow-up radiograph. EXAM: CHEST - 2 VIEW COMPARISON:  08/16/2021, 07/22/2021 FINDINGS: The mediastinal contours are within normal limits. No cardiomegaly. Slight increased conspicuity about the lateral aspect of the left lower lobe anteromedial basal segment focal opacification. No new focal consolidations. No pleural effusion or pneumothorax. No acute osseous  abnormality. Similar appearing flowing anterior disc osteophytes about the thoracolumbar spine as could be seen with diffuse idiopathic skeletal hyperostosis. IMPRESSION: Interval increased conspicuity of left lower l

## 2021-08-22 NOTE — TOC Transition Note (Addendum)
Transition of Care (TOC) - CM/SW Discharge Note ? ? ?Patient Details  ?Name: Anthony Lamb ?MRN: [id number] ?Date of Birth: [birth year] ? ?Transition of Care (TOC) CM/SW Contact:  ?Trish Mage, LCSW ?Phone Number: ?08/22/2021, 11:58 AM ? ? ?Clinical Narrative:   Patient who is stable for d/c will transfer to [facility], [address], [phone].  FL2, d/c summary and proof of TB test emailed, and med scripts sent to [facility] in [place] per request. They will pick him up at [place]. No further needs identified.  TOC sign off. ? ? ? ?Final next level of care: Group Home ?Barriers to Discharge: Barriers Resolved ? ? ?Patient Goals and CMS Choice ?  ?  ?  ? ?Discharge Placement ?  ?           ?  ?  ?  ?  ? ?Discharge Plan and Services ?  ?  ?           ?  ?  ?  ?  ?  ?  ?  ?  ?  ?  ? ?Social Determinants of Health (SDOH) Interventions ?  ? ? ?Readmission Risk Interventions ?No flowsheet data found. ? ? ? ? ?

## 2021-08-22 NOTE — Progress Notes (Signed)
Pharmacy Antibiotic Note ? ?Anthony Lamb is a 66 y.o. male with hx of alcohol abuse, anxiety, depression, and chronic pain who was admitted on 07/01/2021 after overdosing himself with 60 tablets of duloxetine 60 mg and presented with anxiety and tremor. Pt was treated with Molnupiravir for incidental COVID during 1/28 to 2/1. CXR 3/14 showed airspace opacity in the peripheral left lung with sx of cough, leukocytosis, and fever. Pt was started on Unasyn on 3/14 but had fever again on 3/16 am with abx changed to Zosyn and Vanc. ? ?Today, 08/19/21 ?- Afebrile (98.4) ?- Last WBC 10.3 on 3/15 ?- SrCr 0.82 ?- Procalcitonin <0.1 on 3/16 ?- Total abx Day#7, Day#5 Vanc/Zosyn ? ?Plan: ?- Continue Vancomycin 1000 mg IV every 12 hours for estimated AUC 506 ?- Zosyn 3.375g q8h over 4 hours ?- Follow SCr daily due to increased risk of nephrotoxicity with Vanc/Zosyn combo ?- Follow up transition to oral abx ? ? ?Height: 5' 10"  (177.8 cm) ?Weight: 68.2 kg (150 lb 5.7 oz) ?IBW/kg (Calculated) : 73 ? ?Temp (24hrs), Avg:97.9 ?F (36.6 ?C), Min:97.6 ?F (36.4 ?C), Max:98 ?F (36.7 ?C) ? ?Recent Labs  ?Lab 08/16/21 ?1031 08/17/21 ?6962 08/17/21 ?9528 08/18/21 ?0500 08/19/21 ?0450 08/20/21 ?0444 08/21/21 ?4132 08/22/21 ?0453  ?WBC 15.1* 10.3  --   --   --   --   --   --   ?CREATININE  --  0.58*   < > 0.68 0.69 0.80 0.75 0.82  ? < > = values in this interval not displayed.  ? ?  ?Estimated Creatinine Clearance: 86.6 mL/min (by C-G formula based on SCr of 0.82 mg/dL).   ? ?Allergies  ?Allergen Reactions  ? Nsaids Other (See Comments)  ?  GI upset- history of peptic ulcers!!  ? Diphenhydramine Hcl Other (See Comments)  ?  Restlessness  ? Flexeril [Cyclobenzaprine] Other (See Comments)  ?  Restlessness  ? Fluoxetine Other (See Comments)  ?  Made the patient feel "worse than before" (treatment)  ? Hctz [Hydrochlorothiazide] Other (See Comments)  ?  Caused to lose sodium when taking with Lisinopril  ? Rexulti [Brexpiprazole] Other (See  Comments)  ?  "Made me not feel right- restless"  ? Seroquel [Quetiapine] Other (See Comments)  ?  Restless legs and makes the patient sweat- also does not help patient's insomnia. Pt takes this at home in 2023.  ? Gabapentin Diarrhea  ? Hydroxyzine Other (See Comments)  ?  Restlessness  ? ?3/14 unasyn (asp PNA)>> 3/16 ?3/16 Vanco 3/16>> ?3/16 Zosyn 3/16>> ?  ?NO Cx ? ?Thank you for allowing pharmacy to be a part of this patient?s care. ? ?Peggyann Juba, PharmD, BCPS ?Pharmacy: [phone] ?08/22/2021 8:45 AM ? ?

## 2022-01-27 DIAGNOSIS — R079 Chest pain, unspecified: Secondary | ICD-10-CM | POA: Diagnosis not present

## 2022-01-27 DIAGNOSIS — Z888 Allergy status to other drugs, medicaments and biological substances status: Secondary | ICD-10-CM | POA: Diagnosis not present

## 2022-01-27 DIAGNOSIS — R0789 Other chest pain: Secondary | ICD-10-CM | POA: Diagnosis not present

## 2022-01-27 DIAGNOSIS — R059 Cough, unspecified: Secondary | ICD-10-CM | POA: Diagnosis not present

## 2022-01-27 DIAGNOSIS — I10 Essential (primary) hypertension: Secondary | ICD-10-CM | POA: Diagnosis not present

## 2022-01-27 DIAGNOSIS — F419 Anxiety disorder, unspecified: Secondary | ICD-10-CM | POA: Diagnosis not present

## 2022-01-27 DIAGNOSIS — Z609 Problem related to social environment, unspecified: Secondary | ICD-10-CM | POA: Diagnosis not present

## 2022-01-27 DIAGNOSIS — E860 Dehydration: Secondary | ICD-10-CM | POA: Diagnosis not present

## 2022-01-27 DIAGNOSIS — M549 Dorsalgia, unspecified: Secondary | ICD-10-CM | POA: Diagnosis not present

## 2022-01-27 DIAGNOSIS — R404 Transient alteration of awareness: Secondary | ICD-10-CM | POA: Diagnosis not present

## 2022-01-27 DIAGNOSIS — R0602 Shortness of breath: Secondary | ICD-10-CM | POA: Diagnosis not present

## 2022-01-27 DIAGNOSIS — I700 Atherosclerosis of aorta: Secondary | ICD-10-CM | POA: Diagnosis not present

## 2022-01-27 DIAGNOSIS — R42 Dizziness and giddiness: Secondary | ICD-10-CM | POA: Diagnosis not present

## 2022-01-27 DIAGNOSIS — R0689 Other abnormalities of breathing: Secondary | ICD-10-CM | POA: Diagnosis not present

## 2022-04-04 DIAGNOSIS — I10 Essential (primary) hypertension: Secondary | ICD-10-CM | POA: Diagnosis not present

## 2022-04-04 DIAGNOSIS — Z888 Allergy status to other drugs, medicaments and biological substances status: Secondary | ICD-10-CM | POA: Diagnosis not present

## 2022-04-04 DIAGNOSIS — R519 Headache, unspecified: Secondary | ICD-10-CM | POA: Diagnosis not present

## 2022-04-04 DIAGNOSIS — I700 Atherosclerosis of aorta: Secondary | ICD-10-CM | POA: Diagnosis not present

## 2022-04-04 DIAGNOSIS — R079 Chest pain, unspecified: Secondary | ICD-10-CM | POA: Diagnosis not present

## 2022-04-04 DIAGNOSIS — R0602 Shortness of breath: Secondary | ICD-10-CM | POA: Diagnosis not present

## 2022-04-04 DIAGNOSIS — R0789 Other chest pain: Secondary | ICD-10-CM | POA: Diagnosis not present

## 2022-04-04 DIAGNOSIS — R001 Bradycardia, unspecified: Secondary | ICD-10-CM | POA: Diagnosis not present

## 2022-06-11 DIAGNOSIS — W19XXXA Unspecified fall, initial encounter: Secondary | ICD-10-CM | POA: Diagnosis not present

## 2022-06-11 DIAGNOSIS — M549 Dorsalgia, unspecified: Secondary | ICD-10-CM | POA: Diagnosis not present

## 2022-06-12 DIAGNOSIS — Z79899 Other long term (current) drug therapy: Secondary | ICD-10-CM | POA: Diagnosis not present

## 2022-06-12 DIAGNOSIS — M160 Bilateral primary osteoarthritis of hip: Secondary | ICD-10-CM | POA: Diagnosis not present

## 2022-06-12 DIAGNOSIS — M47816 Spondylosis without myelopathy or radiculopathy, lumbar region: Secondary | ICD-10-CM | POA: Diagnosis not present

## 2022-06-12 DIAGNOSIS — F32A Depression, unspecified: Secondary | ICD-10-CM | POA: Diagnosis not present

## 2022-06-12 DIAGNOSIS — Z888 Allergy status to other drugs, medicaments and biological substances status: Secondary | ICD-10-CM | POA: Diagnosis not present

## 2022-06-12 DIAGNOSIS — W06XXXA Fall from bed, initial encounter: Secondary | ICD-10-CM | POA: Diagnosis not present

## 2022-06-12 DIAGNOSIS — S0990XA Unspecified injury of head, initial encounter: Secondary | ICD-10-CM | POA: Diagnosis not present

## 2022-06-12 DIAGNOSIS — R059 Cough, unspecified: Secondary | ICD-10-CM | POA: Diagnosis not present

## 2022-06-12 DIAGNOSIS — F419 Anxiety disorder, unspecified: Secondary | ICD-10-CM | POA: Diagnosis not present

## 2022-06-12 DIAGNOSIS — Z043 Encounter for examination and observation following other accident: Secondary | ICD-10-CM | POA: Diagnosis not present

## 2022-06-12 DIAGNOSIS — N400 Enlarged prostate without lower urinary tract symptoms: Secondary | ICD-10-CM | POA: Diagnosis not present

## 2022-07-13 DIAGNOSIS — E860 Dehydration: Secondary | ICD-10-CM | POA: Diagnosis not present

## 2022-07-31 DIAGNOSIS — R1084 Generalized abdominal pain: Secondary | ICD-10-CM | POA: Diagnosis not present

## 2022-07-31 DIAGNOSIS — R10817 Generalized abdominal tenderness: Secondary | ICD-10-CM | POA: Diagnosis not present

## 2022-07-31 DIAGNOSIS — K5790 Diverticulosis of intestine, part unspecified, without perforation or abscess without bleeding: Secondary | ICD-10-CM | POA: Diagnosis not present

## 2022-07-31 DIAGNOSIS — R319 Hematuria, unspecified: Secondary | ICD-10-CM | POA: Diagnosis not present

## 2022-07-31 DIAGNOSIS — Z888 Allergy status to other drugs, medicaments and biological substances status: Secondary | ICD-10-CM | POA: Diagnosis not present

## 2022-07-31 DIAGNOSIS — I10 Essential (primary) hypertension: Secondary | ICD-10-CM | POA: Diagnosis not present

## 2022-07-31 DIAGNOSIS — R109 Unspecified abdominal pain: Secondary | ICD-10-CM | POA: Diagnosis not present

## 2022-07-31 DIAGNOSIS — R110 Nausea: Secondary | ICD-10-CM | POA: Diagnosis not present

## 2022-07-31 DIAGNOSIS — R1013 Epigastric pain: Secondary | ICD-10-CM | POA: Diagnosis not present

## 2022-07-31 DIAGNOSIS — I700 Atherosclerosis of aorta: Secondary | ICD-10-CM | POA: Diagnosis not present

## 2022-07-31 DIAGNOSIS — M549 Dorsalgia, unspecified: Secondary | ICD-10-CM | POA: Diagnosis not present

## 2022-08-30 ENCOUNTER — Encounter: Payer: Self-pay | Admitting: Podiatry

## 2022-08-30 ENCOUNTER — Ambulatory Visit (INDEPENDENT_AMBULATORY_CARE_PROVIDER_SITE_OTHER): Payer: Medicare HMO | Admitting: Podiatry

## 2022-08-30 VITALS — BP 150/68 | HR 60

## 2022-08-30 DIAGNOSIS — B351 Tinea unguium: Secondary | ICD-10-CM | POA: Diagnosis not present

## 2022-08-30 DIAGNOSIS — M79674 Pain in right toe(s): Secondary | ICD-10-CM | POA: Diagnosis not present

## 2022-08-30 DIAGNOSIS — M79675 Pain in left toe(s): Secondary | ICD-10-CM

## 2022-08-30 NOTE — Progress Notes (Signed)

## 2022-09-06 DIAGNOSIS — M542 Cervicalgia: Secondary | ICD-10-CM | POA: Diagnosis not present

## 2022-09-06 DIAGNOSIS — G8929 Other chronic pain: Secondary | ICD-10-CM | POA: Diagnosis not present

## 2022-09-06 DIAGNOSIS — Z79899 Other long term (current) drug therapy: Secondary | ICD-10-CM | POA: Diagnosis not present

## 2022-09-06 DIAGNOSIS — E860 Dehydration: Secondary | ICD-10-CM | POA: Diagnosis not present

## 2022-09-06 DIAGNOSIS — Z7401 Bed confinement status: Secondary | ICD-10-CM | POA: Diagnosis not present

## 2022-09-06 DIAGNOSIS — I10 Essential (primary) hypertension: Secondary | ICD-10-CM | POA: Diagnosis not present

## 2022-09-06 DIAGNOSIS — R531 Weakness: Secondary | ICD-10-CM | POA: Diagnosis not present

## 2022-09-06 DIAGNOSIS — F419 Anxiety disorder, unspecified: Secondary | ICD-10-CM | POA: Diagnosis not present

## 2022-09-06 DIAGNOSIS — M452 Ankylosing spondylitis of cervical region: Secondary | ICD-10-CM | POA: Diagnosis not present

## 2022-09-06 DIAGNOSIS — R42 Dizziness and giddiness: Secondary | ICD-10-CM | POA: Diagnosis not present

## 2022-09-27 DIAGNOSIS — M549 Dorsalgia, unspecified: Secondary | ICD-10-CM | POA: Diagnosis not present

## 2022-09-27 DIAGNOSIS — Z888 Allergy status to other drugs, medicaments and biological substances status: Secondary | ICD-10-CM | POA: Diagnosis not present

## 2022-09-27 DIAGNOSIS — M542 Cervicalgia: Secondary | ICD-10-CM | POA: Diagnosis not present

## 2022-09-27 DIAGNOSIS — E860 Dehydration: Secondary | ICD-10-CM | POA: Diagnosis not present

## 2022-09-27 DIAGNOSIS — I10 Essential (primary) hypertension: Secondary | ICD-10-CM | POA: Diagnosis not present

## 2022-09-27 DIAGNOSIS — R42 Dizziness and giddiness: Secondary | ICD-10-CM | POA: Diagnosis not present

## 2022-09-27 DIAGNOSIS — F411 Generalized anxiety disorder: Secondary | ICD-10-CM | POA: Diagnosis not present

## 2022-09-27 DIAGNOSIS — R0789 Other chest pain: Secondary | ICD-10-CM | POA: Diagnosis not present

## 2022-10-17 DIAGNOSIS — H5213 Myopia, bilateral: Secondary | ICD-10-CM | POA: Diagnosis not present

## 2023-01-01 ENCOUNTER — Ambulatory Visit: Payer: Medicare HMO | Admitting: Podiatry

## 2023-01-01 DIAGNOSIS — M25572 Pain in left ankle and joints of left foot: Secondary | ICD-10-CM | POA: Diagnosis not present

## 2023-01-01 DIAGNOSIS — W19XXXA Unspecified fall, initial encounter: Secondary | ICD-10-CM | POA: Diagnosis not present

## 2023-01-01 DIAGNOSIS — M25551 Pain in right hip: Secondary | ICD-10-CM | POA: Diagnosis not present

## 2023-01-02 ENCOUNTER — Other Ambulatory Visit: Payer: Self-pay

## 2023-01-02 ENCOUNTER — Emergency Department (HOSPITAL_COMMUNITY): Payer: Medicare HMO

## 2023-01-02 ENCOUNTER — Emergency Department (HOSPITAL_COMMUNITY)
Admission: EM | Admit: 2023-01-02 | Discharge: 2023-01-02 | Disposition: A | Discharge status: Home / Self Care | Payer: Medicare HMO | Attending: Emergency Medicine | Admitting: Emergency Medicine

## 2023-01-02 DIAGNOSIS — S99921A Unspecified injury of right foot, initial encounter: Secondary | ICD-10-CM | POA: Diagnosis present

## 2023-01-02 DIAGNOSIS — S300XXA Contusion of lower back and pelvis, initial encounter: Secondary | ICD-10-CM | POA: Diagnosis not present

## 2023-01-02 DIAGNOSIS — R519 Headache, unspecified: Secondary | ICD-10-CM | POA: Diagnosis not present

## 2023-01-02 DIAGNOSIS — M85871 Other specified disorders of bone density and structure, right ankle and foot: Secondary | ICD-10-CM | POA: Diagnosis not present

## 2023-01-02 DIAGNOSIS — W19XXXA Unspecified fall, initial encounter: Secondary | ICD-10-CM | POA: Insufficient documentation

## 2023-01-02 DIAGNOSIS — S0990XA Unspecified injury of head, initial encounter: Secondary | ICD-10-CM | POA: Diagnosis not present

## 2023-01-02 DIAGNOSIS — S199XXA Unspecified injury of neck, initial encounter: Secondary | ICD-10-CM | POA: Diagnosis not present

## 2023-01-02 DIAGNOSIS — S92501A Displaced unspecified fracture of right lesser toe(s), initial encounter for closed fracture: Secondary | ICD-10-CM

## 2023-01-02 DIAGNOSIS — R079 Chest pain, unspecified: Secondary | ICD-10-CM | POA: Insufficient documentation

## 2023-01-02 DIAGNOSIS — S92511A Displaced fracture of proximal phalanx of right lesser toe(s), initial encounter for closed fracture: Secondary | ICD-10-CM | POA: Diagnosis not present

## 2023-01-02 DIAGNOSIS — Y92002 Bathroom of unspecified non-institutional (private) residence single-family (private) house as the place of occurrence of the external cause: Secondary | ICD-10-CM | POA: Insufficient documentation

## 2023-01-02 DIAGNOSIS — M19071 Primary osteoarthritis, right ankle and foot: Secondary | ICD-10-CM | POA: Diagnosis not present

## 2023-01-02 DIAGNOSIS — M160 Bilateral primary osteoarthritis of hip: Secondary | ICD-10-CM | POA: Diagnosis not present

## 2023-01-02 DIAGNOSIS — E871 Hypo-osmolality and hyponatremia: Secondary | ICD-10-CM | POA: Diagnosis not present

## 2023-01-02 DIAGNOSIS — D72829 Elevated white blood cell count, unspecified: Secondary | ICD-10-CM | POA: Insufficient documentation

## 2023-01-02 DIAGNOSIS — E876 Hypokalemia: Secondary | ICD-10-CM | POA: Insufficient documentation

## 2023-01-02 DIAGNOSIS — M25551 Pain in right hip: Secondary | ICD-10-CM | POA: Diagnosis not present

## 2023-01-02 DIAGNOSIS — R197 Diarrhea, unspecified: Secondary | ICD-10-CM | POA: Diagnosis not present

## 2023-01-02 DIAGNOSIS — R220 Localized swelling, mass and lump, head: Secondary | ICD-10-CM | POA: Diagnosis not present

## 2023-01-02 DIAGNOSIS — M7731 Calcaneal spur, right foot: Secondary | ICD-10-CM | POA: Diagnosis not present

## 2023-01-02 DIAGNOSIS — R0789 Other chest pain: Secondary | ICD-10-CM | POA: Diagnosis not present

## 2023-01-02 LAB — BASIC METABOLIC PANEL
Anion gap: 7 (ref 5–15)
BUN: 5 mg/dL — ABNORMAL LOW (ref 8–23)
CO2: 27 mmol/L (ref 22–32)
Calcium: 8.7 mg/dL — ABNORMAL LOW (ref 8.9–10.3)
Chloride: 91 mmol/L — ABNORMAL LOW (ref 98–111)
Creatinine, Ser: 0.83 mg/dL (ref 0.61–1.24)
GFR, Estimated: >60 mL/min (ref >60)
Glucose, Bld: 101 mg/dL — ABNORMAL HIGH (ref 70–99)
Potassium: 3.4 mmol/L — ABNORMAL LOW (ref 3.5–5.1)
Sodium: 125 mmol/L — ABNORMAL LOW (ref 135–145)

## 2023-01-02 LAB — CBC
HCT: 39.9 % (ref 39.0–52.0)
Hemoglobin: 14.1 g/dL (ref 13.0–17.0)
MCH: 32.3 pg (ref 26.0–34.0)
MCHC: 35.3 g/dL (ref 30.0–36.0)
MCV: 91.5 fL (ref 80.0–100.0)
Platelets: 197 10*3/uL (ref 150–400)
RBC: 4.36 MIL/uL (ref 4.22–5.81)
RDW: 12.3 % (ref 11.5–15.5)
WBC: 11.4 10*3/uL — ABNORMAL HIGH (ref 4.0–10.5)
nRBC: 0 % (ref 0.0–0.2)

## 2023-01-02 LAB — URINALYSIS, ROUTINE W REFLEX MICROSCOPIC
Bilirubin Urine: NEGATIVE
Glucose, UA: NEGATIVE mg/dL
Ketones, ur: NEGATIVE mg/dL
Leukocytes,Ua: NEGATIVE
Nitrite: NEGATIVE
Protein, ur: NEGATIVE mg/dL
Specific Gravity, Urine: 1.003 — ABNORMAL LOW (ref 1.005–1.030)
pH: 7 (ref 5.0–8.0)

## 2023-01-02 LAB — CBG MONITORING, ED: Glucose-Capillary: 109 mg/dL — ABNORMAL HIGH (ref 70–99)

## 2023-01-02 LAB — TROPONIN I (HIGH SENSITIVITY): Troponin I (High Sensitivity): 4 ng/L (ref <18)

## 2023-01-02 MED ORDER — POTASSIUM CHLORIDE CRYS ER 20 MEQ PO TBCR: 20.0000 meq | EXTENDED_RELEASE_TABLET | Freq: Two times a day (BID) | ORAL | 0 refills | Status: DC

## 2023-01-02 MED ORDER — POTASSIUM CHLORIDE CRYS ER 20 MEQ PO TBCR: 40.0000 meq | EXTENDED_RELEASE_TABLET | Freq: Once | ORAL | Status: DC

## 2023-01-02 MED ORDER — OXYCODONE-ACETAMINOPHEN 5-325 MG PO TABS
1.0000 | ORAL_TABLET | ORAL | Status: DC | PRN
  Administered 2023-01-02: 1 via ORAL
  Filled 2023-01-02: qty 1

## 2023-01-02 MED ORDER — SODIUM CHLORIDE 0.9 % IV BOLUS
1000.0000 mL | Freq: Once | INTRAVENOUS | Status: AC
  Administered 2023-01-02: 1000 mL via INTRAVENOUS

## 2023-01-02 MED ORDER — MORPHINE SULFATE (PF) 4 MG/ML IV SOLN
4.0000 mg | Freq: Once | INTRAVENOUS | Status: AC
  Administered 2023-01-02: 4 mg via INTRAVENOUS
  Filled 2023-01-02: qty 1

## 2023-01-02 NOTE — ED Triage Notes (Signed)
Pt bib [facility] from [facility] c/o unwitnessed fall. Pt reports he was in the bathroom when he fell, states he thinks he passed out d/t dehydration. C/o right hip and right foot pain. No obvious deformities. Denies taking blood thinners.

## 2023-01-02 NOTE — ED Notes (Signed)
Patient transported to CT 

## 2023-01-02 NOTE — ED Notes (Signed)
Delay explained to pt. Pt continues to wait on EDP d/t high patient acuity and volume. PRN percocett given to pt per Triage Order Set

## 2023-01-02 NOTE — ED Notes (Signed)
EDP at bedside  

## 2023-01-02 NOTE — Discharge Instructions (Signed)
Apply ice to sore areas.  Ice to be applied for 30 minutes at a time, 4 times a day.  Buddy tape your injured toe for the next 3 weeks, then as needed.

## 2023-01-02 NOTE — ED Provider Notes (Signed)
[place] EMERGENCY DEPARTMENT AT [facility] Provider Note   CSN: [id number] Arrival date & time: 01/02/23  0040     History  Chief Complaint  Patient presents with   Anthony Lamb is a 67 y.o. male.  The history is provided by the patient.  Fall  He has history of hypertension, anxiety and comes in following a fall.  He states she went to the bathroom and fell and does not know if he passed out.  He does not remember falling.  He is complaining of pain in the right lateral pelvis and also in his right foot.  He thinks he hit his head.  He also feels that he is dehydrated stating that he has chronic diarrhea and is not able to take enough oral fluids.  He has been having some episodes of chest pains over the last several weeks.  He denies nausea, vomiting, diaphoresis.   Home Medications Prior to Admission medications   Medication Sig Start Date End Date Taking? Authorizing Provider  acetaminophen (TYLENOL) 500 MG tablet Take 500 mg by mouth every 6 (six) hours as needed for mild pain.    [provider]  clonazePAM (KLONOPIN) 0.5 MG tablet Take 0.5 tablets (0.25 mg total) by mouth every morning. 08/23/21   Calvert Cantor, MD  clonazePAM (KLONOPIN) 1 MG tablet Take 1 tablet (1 mg total) by mouth at bedtime. 08/22/21   Calvert Cantor, MD  feeding supplement (ENSURE ENLIVE / ENSURE PLUS) LIQD Take 237 mLs by mouth 3 (three) times daily between meals. 08/22/21   Calvert Cantor, MD  mirtazapine (REMERON) 15 MG tablet Take 1 tablet (15 mg total) by mouth at bedtime. 08/22/21   Calvert Cantor, MD  Multiple Vitamin (MULTIVITAMIN WITH MINERALS) TABS tablet Take 1 tablet by mouth daily. (May buy over the counter) Patient taking differently: Take 1 tablet by mouth daily. 06/28/18   Aldean Baker, NP  oxyCODONE (OXY IR/ROXICODONE) 5 MG immediate release tablet Take 1 tablet (5 mg total) by mouth daily as needed for severe pain or breakthrough pain. 08/22/21   Calvert Cantor, MD  pantoprazole (PROTONIX) 40 MG tablet Take 1 tablet (40 mg total) by mouth daily. Patient taking differently: Take 40 mg by mouth daily before breakfast. 04/24/14   Etta Grandchild, MD  propranolol (INDERAL) 20 MG tablet Take 1 tablet (20 mg total) by mouth 3 (three) times daily. 08/22/21   Calvert Cantor, MD  QUEtiapine (SEROQUEL) 50 MG tablet Take 1 tablet (50 mg total) by mouth at bedtime. 08/22/21   Calvert Cantor, MD  SALINE NASAL SPRAY NA Place 1 spray into both nostrils daily.    [provider]  sucralfate (CARAFATE) 1 g tablet Take 1 g by mouth 4 (four) times daily. 06/05/21   [provider]  zolpidem (AMBIEN) 5 MG tablet Take 1 tablet (5 mg total) by mouth at bedtime. 08/22/21   Calvert Cantor, MD      Allergies    Nsaids, Diphenhydramine hcl, Flexeril [cyclobenzaprine], Fluoxetine, Hctz [hydrochlorothiazide], Rexulti [brexpiprazole], Seroquel [quetiapine], Gabapentin, and Hydroxyzine    Review of Systems   Review of Systems  All other systems reviewed and are negative.   Physical Exam Updated Vital Signs BP (!) 149/83   Pulse 73   Temp 97.9 F (36.6 C) (Oral)   Resp 14   Ht 5\' 10"  (1.778 m)   Wt 68 kg   SpO2 98%   BMI 21.51 kg/m  Physical Exam  Vitals and nursing note reviewed.   67 year old male, resting comfortably and in no acute distress. Vital signs are significant for mildly elevated blood pressure. Oxygen saturation is 98%, which is normal. Head is normocephalic and atraumatic. PERRLA, EOMI. Oropharynx is clear. Neck is nontender. Back is nontender and there is no CVA tenderness. Lungs are clear without rales, wheezes, or rhonchi. Chest is nontender. Heart has regular rate and rhythm without murmur. Abdomen is soft, flat, nontender. Pelvis is stable.  There is tenderness palpation over the right lateral iliac crest. Extremities have no cyanosis or edema, full range of motion is present.  There is tenderness to palpation of the right  third MTP joint. Skin is warm and dry without rash. Neurologic: Mental status is normal, cranial nerves are intact, moves all extremities equally.  ED Results / Procedures / Treatments   Labs (all labs ordered are listed, but only abnormal results are displayed) Labs Reviewed  BASIC METABOLIC PANEL - Abnormal; Notable for the following components:      Result Value   Sodium 125 (*)    Potassium 3.4 (*)    Chloride 91 (*)    Glucose, Bld 101 (*)    BUN 5 (*)    Calcium 8.7 (*)    All other components within normal limits  CBC - Abnormal; Notable for the following components:   WBC 11.4 (*)    All other components within normal limits  URINALYSIS, ROUTINE W REFLEX MICROSCOPIC - Abnormal; Notable for the following components:   Color, Urine STRAW (*)    Specific Gravity, Urine 1.003 (*)    Hgb urine dipstick MODERATE (*)    Bacteria, UA RARE (*)    All other components within normal limits  CBG MONITORING, ED - Abnormal; Notable for the following components:   Glucose-Capillary 109 (*)    All other components within normal limits  TROPONIN I (HIGH SENSITIVITY)    EKG EKG Interpretation Date/Time:  Tuesday January 02 2023 00:47:01 EDT Ventricular Rate:  64 PR Interval:  177 QRS Duration:  135 QT Interval:  443 QTC Calculation: 458 R Axis:   62  Text Interpretation: Sinus rhythm Atrial premature complex Nonspecific intraventricular conduction delay When compared with ECG of 07/31/2021, Premature atrial complexes are now present Confirmed by Dione Booze ([id number]) on 01/02/2023 12:59:29 AM  Radiology DG Foot Complete Right  Result Date: 01/02/2023 CLINICAL DATA:  Unwitnessed fall with right foot pain. EXAM: RIGHT FOOT COMPLETE - 3+ VIEW COMPARISON:  None Available. FINDINGS: Osteopenia is noted. There is a mildly displaced fracture at the base of the proximal phalanx of the third digit with intra-articular extension. No dislocation. Mild degenerative changes are noted at the first  metatarsophalangeal joint. There is moderate calcaneal spurring. Vascular calcifications are present in the soft tissues. IMPRESSION: Mildly displaced fracture at the base of the proximal phalanx of the third digit. Electronically Signed   By: Thornell Sartorius M.D.   On: 01/02/2023 01:31   DG Hip Unilat  With Pelvis 2-3 Views Right  Result Date: 01/02/2023 CLINICAL DATA:  Unwitnessed fall with right hip pain. EXAM: DG HIP (WITH OR WITHOUT PELVIS) 2-3V RIGHT COMPARISON:  None Available. FINDINGS: There is no evidence of hip fracture or dislocation. Mild degenerative changes are noted at the hips bilaterally. Vascular calcifications are present in the bilateral lower extremities. IMPRESSION: 1. No acute fracture or dislocation. 2. Mild degenerative changes at the hips bilaterally. Electronically Signed   By: Charlestine Night.D.  On: 01/02/2023 01:29    Procedures .Ortho Injury Treatment  Date/Time: 01/02/2023 5:27 AM  Performed by: Dione Booze, MD Authorized by: Dione Booze, MD   Consent:    Consent obtained:  Verbal   Consent given by:  Patient   Procedural risks discussed: Pain.   Alternatives discussed:  No treatmentInjury location: toe Location details: right third toe Injury type: fracture Pre-procedure neurovascular assessment: neurovascularly intact Pre-procedure distal perfusion: normal Pre-procedure neurological function: normal Pre-procedure range of motion: normal  Anesthesia: Local anesthesia used: no  Patient sedated: NoManipulation performed: no Immobilization: tape Splint Applied by: ED Nurse Supplies used: tape. Post-procedure neurovascular assessment: post-procedure neurovascularly intact Post-procedure distal perfusion: normal Post-procedure neurological function: normal Post-procedure range of motion: unchanged       Medications Ordered in ED Medications  oxyCODONE-acetaminophen (PERCOCET/ROXICET) 5-325 MG per tablet 1 tablet (1 tablet Oral Given 01/02/23  0146)  sodium chloride 0.9 % bolus 1,000 mL (has no administration in time range)  morphine (PF) 4 MG/ML injection 4 mg (has no administration in time range)    ED Course/ Medical Decision Making/ A&P                             Medical Decision Making Amount and/or Complexity of Data Reviewed Labs: ordered. Radiology: ordered.  Risk Prescription drug management.   Fall with injury to pelvis and right foot.  X-rays do show a fracture of the base of the proximal phalanx of the right third toe.  Right hip x-rays are negative for fracture.  I have independently viewed the images, and agree with the radiologist interpretation.  CT of head and cervical spine have been ordered with results pending.  I have reviewed and interpreted his laboratory tests, and my interpretation is moderate hyponatremia, mild hypokalemia, borderline elevated random glucose level, mild leukocytosis which is nonspecific.  Normal urinalysis.  I have ordered a dose of oral potassium.  I have reviewed his past records, and he has persistent hyponatremia in similar range to today's value.  I have ordered IV fluids.  Because of his recent chest pain and questionable syncope, I have ordered an electrocardiogram and single troponin.  I have ordered nursing to buddy tape his injured toe.  I have reviewed and interpreted his electrocardiogram, and my interpretation is nonspecific intraventricular conduction delay not significantly changed from prior.  Troponin is normal.  I am discharging him with instructions to apply ice, told to use over-the-counter acetaminophen as needed for pain.  I am referring him to cardiology for evaluation of his chest pain.  I am giving him a prescription for oral potassium.  Final Clinical Impression(s) / ED Diagnoses Final diagnoses:  Fall at home, initial encounter  Closed fracture of phalanx of right third toe, initial encounter  Contusion of pelvis, initial encounter  Nonspecific chest pain   Hyponatremia  Hypokalemia    Rx / DC Orders ED Discharge Orders          Ordered    Ambulatory referral to Cardiology       Comments: If you have not heard from the Cardiology office within the next 72 hours please call [phone].   01/02/23 0710    potassium chloride SA (KLOR-CON M) 20 MEQ tablet  2 times daily        01/02/23 0710              Dione Booze, MD 01/02/23 [phone]

## 2023-01-02 NOTE — ED Notes (Signed)
Pt c/o head pain and neck pain. Verbal orders for CT obtains as pt continues to wait for edp evaluation

## 2023-01-02 NOTE — ED Notes (Signed)
Faxed IVC paperwork to [phone], & [phone]

## 2023-01-02 NOTE — ED Notes (Signed)
Patient transported to X-ray 

## 2023-01-09 ENCOUNTER — Telehealth: Payer: Self-pay

## 2023-01-09 NOTE — Telephone Encounter (Signed)
Entered in error

## 2023-02-26 ENCOUNTER — Telehealth: Payer: Self-pay | Admitting: Internal Medicine

## 2023-02-26 ENCOUNTER — Ambulatory Visit: Payer: Medicare HMO | Attending: Internal Medicine | Admitting: Internal Medicine

## 2023-02-26 ENCOUNTER — Encounter: Payer: Self-pay | Admitting: Internal Medicine

## 2023-02-26 VITALS — BP 134/80 | HR 66 | Ht 70.0 in | Wt 150.0 lb

## 2023-02-26 DIAGNOSIS — R42 Dizziness and giddiness: Secondary | ICD-10-CM | POA: Diagnosis not present

## 2023-02-26 DIAGNOSIS — R079 Chest pain, unspecified: Secondary | ICD-10-CM

## 2023-02-26 DIAGNOSIS — R55 Syncope and collapse: Secondary | ICD-10-CM | POA: Insufficient documentation

## 2023-02-26 NOTE — Patient Instructions (Signed)
Medication Instructions:  Your physician recommends that you continue on your current medications as directed. Please refer to the Current Medication list given to you today.  *If you need a refill on your cardiac medications before your next appointment, please call your pharmacy*   Lab Work: None If you have labs (blood work) drawn today and your tests are completely normal, you will receive your results only by: MyChart Message (if you have MyChart) OR A paper copy in the mail If you have any lab test that is abnormal or we need to change your treatment, we will call you to review the results.   Testing/Procedures: Your physician has requested that you have a lexiscan myoview. For further information please visit [email or web address] Please follow instruction sheet, as given.    Follow-Up: At [facility], you and your health needs are our priority.  As part of our continuing mission to provide you with exceptional heart care, we have created designated Provider Care Teams.  These Care Teams include your primary Cardiologist (physician) and Advanced Practice Providers (APPs -  Physician Assistants and Nurse Practitioners) who all work together to provide you with the care you need, when you need it.  We recommend signing up for the patient portal called "MyChart".  Sign up information is provided on this After Visit Summary.  MyChart is used to connect with patients for Virtual Visits (Telemedicine).  Patients are able to view lab/test results, encounter notes, upcoming appointments, etc.  Non-urgent messages can be sent to your provider as well.   To learn more about what you can do with MyChart, go to [email or web address].    Your next appointment:   Pending Testing Results   Provider:   You may see Vishnu Norton Pastel, MD or the following Advanced Practice Provider on your designated Care Team:   Sharlene Dory, NP    Other Instructions

## 2023-02-26 NOTE — Telephone Encounter (Signed)
Checking percert on the following patient for testing scheduled at [facility].   LEXISCAN  03/14/2023

## 2023-02-26 NOTE — Progress Notes (Signed)
Cardiology Office Note  Date: 02/26/2023   ID: Anthony Lamb, DOB 10-Jul-1955, MRN [id number]  PCP:  Rush Farmer, NP  Cardiologist:  Marjo Bicker, MD Electrophysiologist:  None   History of Present Illness: Anthony Lamb is a 67 y.o. male with no PMH was referred to cardiology clinic for evaluation of chest pain.  Patient has chronic ongoing nonspecific chest pain, occurs both at rest and exertion, lasts from minutes to hours.  Walks with a cane.  Not much active at home except walking in the house.  He has a caregiver who performs all the household chores.  He has neck and spine issues due to which he is extremely rigid.  He cannot even lay on his back, he has to lay on his side.  His mother had PCI at the age of 82.  Past Medical History:  Diagnosis Date   Alcohol abuse, in remission    Anal fissure    Anemia    Ankylosing spondylitis (HCC)    Anxiety    Arthritis    Chronic diarrhea    Chronic headaches    Chronic pain    Colitis    Colon polyps    Depression    Esophagitis    Gastric polyps    hyperplastic and fundic gland   Gastritis    GERD (gastroesophageal reflux disease)    Hypertension    IBS (irritable bowel syndrome)    Poor dentition    SIADH (syndrome of inappropriate ADH production) (HCC)     Past Surgical History:  Procedure Laterality Date   BIOPSY  11/26/2018   Procedure: BIOPSY;  Surgeon: Iva Boop, MD;  Location: [facility];  Service: Endoscopy;;   COLONOSCOPY W/ BIOPSIES     COLONOSCOPY WITH PROPOFOL N/A 11/26/2018   Procedure: COLONOSCOPY WITH PROPOFOL;  Surgeon: Iva Boop, MD;  Location: [facility];  Service: Endoscopy;  Laterality: N/A;   ESOPHAGOGASTRODUODENOSCOPY     ESOPHAGOGASTRODUODENOSCOPY (EGD) WITH PROPOFOL N/A 11/26/2018   Procedure: ESOPHAGOGASTRODUODENOSCOPY (EGD) WITH PROPOFOL;  Surgeon: Iva Boop, MD;  Location: [facility];  Service: Endoscopy;  Laterality: N/A;   HEMOSTASIS CLIP  PLACEMENT  11/26/2018   Procedure: HEMOSTASIS CLIP PLACEMENT;  Surgeon: Iva Boop, MD;  Location: [facility];  Service: Endoscopy;;   HERNIA REPAIR Bilateral    HOT HEMOSTASIS N/A 11/26/2018   Procedure: HOT HEMOSTASIS (ARGON PLASMA COAGULATION/BICAP);  Surgeon: Iva Boop, MD;  Location: [facility];  Service: Endoscopy;  Laterality: N/A;   OPEN REDUCTION INTERNAL FIXATION (ORIF) DISTAL RADIAL FRACTURE Right 02/11/2018   Procedure: OPEN REDUCTION INTERNAL FIXATION (ORIF) DISTAL RADIAL FRACTURE;  Surgeon: Mack Hook, MD;  Location: [facility];  Service: Orthopedics;  Laterality: Right;   POLYPECTOMY  11/26/2018   Procedure: POLYPECTOMY;  Surgeon: Iva Boop, MD;  Location: [facility];  Service: Endoscopy;;   TONSILLECTOMY      Current Outpatient Medications  Medication Sig Dispense Refill   acetaminophen (TYLENOL) 500 MG tablet Take 500 mg by mouth every 6 (six) hours as needed for mild pain.     feeding supplement (ENSURE ENLIVE / ENSURE PLUS) LIQD Take 237 mLs by mouth 3 (three) times daily between meals. 237 mL 1   LORazepam (ATIVAN) 1 MG tablet Take 1 mg by mouth 2 (two) times daily.     mirtazapine (REMERON) 15 MG tablet Take 1 tablet (15 mg total) by mouth at bedtime. 30 tablet 1   Multiple Vitamin (MULTIVITAMIN WITH MINERALS) TABS tablet  Take 1 tablet by mouth daily. (May buy over the counter) (Patient taking differently: Take 1 tablet by mouth daily.) 1 tablet 0   pantoprazole (PROTONIX) 40 MG tablet Take 1 tablet (40 mg total) by mouth daily. (Patient taking differently: Take 40 mg by mouth daily before breakfast.) 30 tablet 11   propranolol (INDERAL) 20 MG tablet Take 1 tablet (20 mg total) by mouth 3 (three) times daily. 90 tablet 1   QC MELATONIN MAX ST 5 MG TABS Take 5 mg by mouth at bedtime.     QUEtiapine (SEROQUEL) 50 MG tablet Take 1 tablet (50 mg total) by mouth at bedtime. 30 tablet 1   SALINE NASAL SPRAY NA Place 1 spray into both nostrils daily.      sucralfate (CARAFATE) 1 g tablet Take 1 g by mouth 4 (four) times daily.     traZODone (DESYREL) 50 MG tablet Take 50 mg by mouth at bedtime.     zolpidem (AMBIEN) 5 MG tablet Take 1 tablet (5 mg total) by mouth at bedtime. 30 tablet 0   No current facility-administered medications for this visit.   Allergies:  Nsaids, Diphenhydramine hcl, Flexeril [cyclobenzaprine], Fluoxetine, Hctz [hydrochlorothiazide], Rexulti [brexpiprazole], Seroquel [quetiapine], Gabapentin, and Hydroxyzine   Social History: The patient  reports that he quit smoking about 10 years ago. His smoking use included cigarettes. He has never used smokeless tobacco. He reports that he does not currently use alcohol. He reports that he does not currently use drugs after having used the following drugs: Marijuana.   Family History: The patient's family history includes Anxiety disorder in his mother; Arthritis in his father; Colon cancer (age of onset: 62) in his mother; Congestive Heart Failure in his mother; Crohn's disease in his father and mother; High blood pressure in his father.   ROS:  Please see the history of present illness. Otherwise, complete review of systems is positive for none  All other systems are reviewed and negative.   Physical Exam: VS:  BP 134/80   Pulse 66   Ht 5\' 10"  (1.778 m)   Wt 150 lb (68 kg)   SpO2 97%   BMI 21.52 kg/m , BMI Body mass index is 21.52 kg/m.  Wt Readings from Last 3 Encounters:  02/26/23 150 lb (68 kg)  01/02/23 149 lb 14.6 oz (68 kg)  08/19/21 150 lb 5.7 oz (68.2 kg)    General: Patient appears comfortable at rest. HEENT: Conjunctiva and lids normal, oropharynx clear with moist mucosa. Neck: Supple, no elevated JVP or carotid bruits, no thyromegaly. Lungs: Clear to auscultation, nonlabored breathing at rest. Cardiac: Regular rate and rhythm, no S3 or significant systolic murmur, no pericardial rub. Abdomen: Soft, nontender, no hepatomegaly, bowel sounds present, no guarding  or rebound. Extremities: No pitting edema, distal pulses 2+. Skin: Warm and dry. Musculoskeletal: No kyphosis. Neuropsychiatric: Alert and oriented x3, affect grossly appropriate.  Recent Labwork: 01/02/2023: BUN 5; Creatinine, Ser 0.83; Hemoglobin 14.1; Platelets 197; Potassium 3.4; Sodium 125     Component Value Date/Time   CHOL 183 05/07/2021 1709   TRIG 79 05/07/2021 1709   HDL 69 05/07/2021 1709   CHOLHDL 2.7 05/07/2021 1709   VLDL 16 05/07/2021 1709   LDLCALC 98 05/07/2021 1709     Assessment and Plan:  Atypical chest pain: Ongoing chest pain, occurs with both rest and exertion, last for minutes to hours.  Frequency few times per week.  Cardiac risk factors include advanced age and family history of CAD (mother had  PCI at the age of 51). Obtain Lexiscan.  Intermittent dizziness and syncope likely secondary to electrolyte abnormalities: Patient likes to drink a lot of water without adequate repletion of salt due to which his serum sodium levels stay extremely low resulting in dizziness and sometimes syncope.  Syncope was in 12/2022 and prior to that, 3 to 4 years ago.  No prodromal symptoms prior to syncopal spell.  Encouraged patient to eat adequate salt, eat healthy and repeat electrolytes.  No indication of event monitor at this time due to infrequent nature of syncope.       Medication Adjustments/Labs and Tests Ordered: Current medicines are reviewed at length with the patient today.  Concerns regarding medicines are outlined above.    Disposition:  Follow up  pending results  Signed Fatina Sprankle Verne Spurr, MD, 02/26/2023 3:39 PM    [facility] [address] [place], [place], [place] [address]

## 2023-03-14 ENCOUNTER — Ambulatory Visit (HOSPITAL_BASED_OUTPATIENT_CLINIC_OR_DEPARTMENT_OTHER)
Admission: RE | Admit: 2023-03-14 | Discharge: 2023-03-14 | Disposition: A | Discharge status: Home / Self Care | Payer: Medicare HMO | Source: Ambulatory Visit | Attending: Internal Medicine | Admitting: Internal Medicine

## 2023-03-14 ENCOUNTER — Ambulatory Visit (HOSPITAL_COMMUNITY)
Admission: RE | Admit: 2023-03-14 | Discharge: 2023-03-14 | Disposition: A | Discharge status: Home / Self Care | Payer: Medicare HMO | Source: Ambulatory Visit | Attending: Internal Medicine | Admitting: Internal Medicine

## 2023-03-14 ENCOUNTER — Encounter (HOSPITAL_COMMUNITY): Payer: Self-pay

## 2023-03-14 DIAGNOSIS — R079 Chest pain, unspecified: Secondary | ICD-10-CM

## 2023-03-14 LAB — NM MYOCAR MULTI W/SPECT W/WALL MOTION / EF
Base ST Depression (mm): 0 mm
LV dias vol: 103 mL (ref 62–150)
LV sys vol: 40 mL
Nuc Stress EF: 61 %
Peak HR: 81 {beats}/min
RATE: 0.5
Rest HR: 56 {beats}/min
Rest Nuclear Isotope Dose: 10 mCi
SDS: 2
SRS: 0
SSS: 2
ST Depression (mm): 0 mm
Stress Nuclear Isotope Dose: 31 mCi
TID: 1.06

## 2023-03-14 MED ORDER — REGADENOSON 0.4 MG/5ML IV SOLN
INTRAVENOUS | Status: AC
  Administered 2023-03-14: 0.4 mg via INTRAVENOUS
  Filled 2023-03-14: qty 5

## 2023-03-14 MED ORDER — TECHNETIUM TC 99M TETROFOSMIN IV KIT
30.0000 | PACK | Freq: Once | INTRAVENOUS | Status: AC | PRN
  Administered 2023-03-14: 31 via INTRAVENOUS

## 2023-03-14 MED ORDER — TECHNETIUM TC 99M TETROFOSMIN IV KIT
10.0000 | PACK | Freq: Once | INTRAVENOUS | Status: AC | PRN
  Administered 2023-03-14: 10 via INTRAVENOUS

## 2023-03-14 MED ORDER — SODIUM CHLORIDE FLUSH 0.9 % IV SOLN
INTRAVENOUS | Status: AC
  Filled 2023-03-14: qty 10

## 2023-03-16 DIAGNOSIS — S40012A Contusion of left shoulder, initial encounter: Secondary | ICD-10-CM | POA: Diagnosis not present

## 2023-03-16 DIAGNOSIS — M25561 Pain in right knee: Secondary | ICD-10-CM | POA: Diagnosis not present

## 2023-03-16 DIAGNOSIS — W19XXXA Unspecified fall, initial encounter: Secondary | ICD-10-CM | POA: Diagnosis not present

## 2023-03-16 DIAGNOSIS — R001 Bradycardia, unspecified: Secondary | ICD-10-CM | POA: Diagnosis not present

## 2023-03-16 DIAGNOSIS — M25512 Pain in left shoulder: Secondary | ICD-10-CM | POA: Diagnosis not present

## 2023-03-16 DIAGNOSIS — I10 Essential (primary) hypertension: Secondary | ICD-10-CM | POA: Diagnosis not present

## 2023-03-16 DIAGNOSIS — M25552 Pain in left hip: Secondary | ICD-10-CM | POA: Diagnosis not present

## 2023-03-16 DIAGNOSIS — S32502A Unspecified fracture of left pubis, initial encounter for closed fracture: Secondary | ICD-10-CM | POA: Diagnosis not present

## 2023-03-16 DIAGNOSIS — S8001XA Contusion of right knee, initial encounter: Secondary | ICD-10-CM | POA: Diagnosis not present

## 2023-03-16 DIAGNOSIS — R55 Syncope and collapse: Secondary | ICD-10-CM | POA: Diagnosis not present

## 2023-03-16 DIAGNOSIS — R079 Chest pain, unspecified: Secondary | ICD-10-CM | POA: Diagnosis not present

## 2023-03-16 DIAGNOSIS — R0689 Other abnormalities of breathing: Secondary | ICD-10-CM | POA: Diagnosis not present

## 2023-03-16 DIAGNOSIS — E860 Dehydration: Secondary | ICD-10-CM | POA: Diagnosis not present

## 2023-03-16 DIAGNOSIS — I959 Hypotension, unspecified: Secondary | ICD-10-CM | POA: Diagnosis not present

## 2023-03-16 DIAGNOSIS — F419 Anxiety disorder, unspecified: Secondary | ICD-10-CM | POA: Diagnosis not present

## 2023-03-16 DIAGNOSIS — S32592A Other specified fracture of left pubis, initial encounter for closed fracture: Secondary | ICD-10-CM | POA: Diagnosis not present

## 2023-03-16 NOTE — ED Provider Notes (Signed)
 Anthony F Goertzen, MD Emergency Department   Diagnosis:  Diagnosis ICD-10-CM Associated Orders  1. Syncope, unspecified syncope type  R55     2. Closed fracture of ramus of left pubis, initial encounter (CMS-HCC)  S32.592A     3. Contusion of left shoulder, initial encounter  S40.012A     4. Contusion of right knee, initial encounter  S80.01XA         Chief Complaint  Patient presents with  . Syncope    History of present illness: The patient is a 67 year old male brought in by ambulance from a group home after syncopal episode.  The patient states he feels like he is dehydrated.  He has been having diarrhea for the past several days.  It stopped 3 days ago and he has not had a bowel movement since that time.  He thinks he gets dehydrated from the diarrhea.  He was walking in the hallway and had the feeling that he was going to pass out and ended up losing consciousness.  He has an abrasion to his forehead and pain to his left shoulder, left hip and right knee.  The most of his pain is in his shoulder.  In the past he has had low potassium and low sodium and is concerned about those as well.  He recently has been having a lot of chest pain and recently had a cardiac evaluation with a stress test. EMS found him to be bradycardic with a heart rate of 47 and gave him atropine     Past Medical History:  Past Medical History:  Diagnosis Date  . Anxiety   . Chronic back pain   . Dementia (CMS-HCC)   . Depression   . Hypertension   . Irritable bowel syndrome   . Tremor     There is no problem list on file for this patient.    No past surgical history on file.  History reviewed. No pertinent family history.   Social History Social History   Tobacco Use  . Smoking status: Never  . Smokeless tobacco: Never  Substance Use Topics  . Alcohol  use: Not Currently  . Drug use: Not Currently     Allergies Allergies  Allergen Reactions  . Atarax  [Hydroxyzine  Hcl]  Anxiety  . Benadryl  [Diphenhydramine  Hcl] Anxiety  . Flexeril  [Cyclobenzaprine ] Anxiety      Home Medications Current Outpatient Medications  Medication Instructions  . clonazePAM  (KLONOPIN ) 0.5 MG tablet No dose, route, or frequency recorded.  . clonazePAM  (KLONOPIN ) 1 mg, Oral, 2 times a day PRN  . DEEP SEA NASAL 0.65 % nasal spray   . melatonin 5 mg tablet   . mirtazapine  (REMERON ) 15 MG tablet No dose, route, or frequency recorded.  . pantoprazole  (PROTONIX ) 40 MG tablet No dose, route, or frequency recorded.  . propranoloL  (INDERAL ) 20 MG tablet No dose, route, or frequency recorded.  . QUEtiapine  (SEROQUEL ) 50 MG tablet No dose, route, or frequency recorded.  . sucralfate  (CARAFATE ) 1 gram tablet No dose, route, or frequency recorded.  . THERA-M 9 mg iron-400 mcg tablet No dose, route, or frequency recorded.  . zolpidem  (AMBIEN ) 5 MG tablet No dose, route, or frequency recorded.      Vitals:  BP 127/73   Pulse 60   Temp 36.4 C (97.6 F) (Oral)   Resp 17   SpO2 98%     Physical Exam: Physical Exam Vitals and nursing note reviewed.  Constitutional:      General: He  is not in acute distress.    Appearance: He is not toxic-appearing.  HENT:     Head: Normocephalic.     Comments: 2 cm circular superficial abrasion to left Eyes:     Extraocular Movements: Extraocular movements intact.     Pupils: Pupils are equal, round, and reactive to light.  Cardiovascular:     Rate and Rhythm: Regular rhythm. Bradycardia present.     Heart sounds: Normal heart sounds.  Pulmonary:     Effort: Pulmonary effort is normal. No respiratory distress.     Breath sounds: Normal breath sounds.  Abdominal:     Palpations: Abdomen is soft.     Tenderness: There is no abdominal tenderness.  Musculoskeletal:        General: Tenderness and signs of injury present.     Cervical back: No rigidity or tenderness.     Comments: Left shoulder swollen over proximal humerus with local tenderness.   Distal digit flexion, extension, abduction, abduction, and opposition intact versus resistance.  Light touch sensation intact.  Cap refill less than 2 seconds.  Skin intact.  Mild tenderness on palpation left hip.  Full range of motion.  Right knee with abrasion over patella and local tenderness.  No effusion.  Skin:    General: Skin is warm.     Capillary Refill: Capillary refill takes less than 2 seconds.  Neurological:     General: No focal deficit present.     Mental Status: He is alert.     Cranial Nerves: No cranial nerve deficit.     Motor: No weakness.      Diagnostics:  Labs: Results for orders placed or performed during the hospital encounter of 03/16/23  Comprehensive Metabolic Panel  Result Value Ref Range   Sodium 130 (L) 135 - 145 mmol/L   Potassium 4.2 3.5 - 5.0 mmol/L   Chloride 94 (L) 98 - 107 mmol/L   CO2 28.6 21.0 - 32.0 mmol/L   Anion Gap 7 3 - 11 mmol/L   BUN 7 (L) 8 - 20 mg/dL   Creatinine 9.19 9.19 - 1.30 mg/dL   BUN/Creatinine Ratio 9    eGFR CKD-EPI (2021) Male >90 >=60 mL/min/1.38m2   Glucose 94 70 - 179 mg/dL   Calcium  8.4 (L) 8.5 - 10.1 mg/dL   Albumin 3.3 (L) 3.5 - 5.0 g/dL   Total Protein 6.9 6.0 - 8.0 g/dL   Total Bilirubin 0.4 0.3 - 1.2 mg/dL   AST 22 15 - 40 U/L   ALT 16 12 - 78 U/L   Alkaline Phosphatase 77 46 - 116 U/L  hsTroponin I (serial 0-2-6H w/ delta)  Result Value Ref Range   hsTroponin I 6 <=53 ng/L  hsTroponin I - 2 Hour  Result Value Ref Range   hsTroponin I 4 <=53 ng/L   delta hsTroponin I 2 <=7 ng/L  ECG 12 Lead  Result Value Ref Range   EKG Systolic BP  mmHg   EKG Diastolic BP  mmHg   EKG Ventricular Rate 59 BPM   EKG Atrial Rate 59 BPM   EKG P-R Interval 176 ms   EKG QRS Duration 98 ms   EKG Q-T Interval 454 ms   EKG QTC Calculation 449 ms   EKG Calculated P Axis 63 degrees   EKG Calculated R Axis 46 degrees   EKG Calculated T Axis 42 degrees   QTC Fredericia 451 ms  CBC w/ Differential  Result Value Ref  Range   WBC 13.2 (  H) 4.0 - 10.5 10*9/L   RBC 4.17 4.10 - 5.60 10*12/L   HGB 13.4 12.5 - 17.0 g/dL   HCT 61.8 63.9 - 49.9 %   MCV 91.4 80.0 - 98.0 fL   MCH 32.1 27.0 - 34.0 pg   MCHC 35.2 32.0 - 36.0 g/dL   RDW 87.8 88.4 - 85.4 %   MPV 9.4 7.4 - 10.4 fL   Platelet 254 140 - 415 10*9/L   Neutrophils % 73.4 %   Lymphocytes % 16.0 %   Monocytes % 7.8 %   Eosinophils % 1.6 %   Basophils % 0.7 %   Absolute Neutrophils 9.7 (H) 1.8 - 7.8 10*9/L   Absolute Lymphocytes 2.1 0.7 - 4.5 10*9/L   Absolute Monocytes 1.0 0.1 - 1.0 10*9/L   Absolute Eosinophils 0.2 0.0 - 0.4 10*9/L   Absolute Basophils 0.1 0.0 - 0.2 10*9/L     Radiology: XR Shoulder 3 Or More Views Left  Final Result  No acute bony abnormality.      Electronically Signed    By: Franky Crease M.D.    On: 03/17/2023 02:12      XR Knee 4 Or More Views Right  Final Result  Negative.      Electronically Signed    By: Franky Crease M.D.    On: 03/17/2023 02:10      XR Hip 2 Views Left  Final Result  Concern for nondisplaced left superior pubic ramus fracture.      Electronically Signed    By: Franky Crease M.D.    On: 03/17/2023 02:11          ED Course as of 03/17/23 0245  Fri Mar 16, 2023  2300 ECG 12 Lead ECG: Reviewed and interpreted by myself: Sinus bradycardia rate 59, normal axis, normal intervals, QTc 449 ms, normal QRS, no T wave changes or ST changes.     Medical Decision Making: Medical Decision Making The patient is a 67 year old male who lives in a group home who had a fall.  He has a history of recurrent falls.  He has pain in multiple areas.  X-rays show a possible left pubic ramus fracture.  Treatment for this will be symptomatic.  Serial troponins were negative.  His electrolytes showed no actionable items.  He complained of dehydration, but is taking p.o.'s well.  His renal function is normal.  He has recently seen a cardiologist for chronic chest pain. His pubic ramus fracture may be managed  as an outpatient.  He already has a walker and was advised to use it.  He is stable for discharge and outpatient follow-up  Problems Addressed: Closed fracture of ramus of left pubis, initial encounter (CMS-HCC): acute illness or injury Contusion of left shoulder, initial encounter: acute illness or injury Contusion of right knee, initial encounter: acute illness or injury Syncope, unspecified syncope type: acute illness or injury  Amount and/or Complexity of Data Reviewed External Data Reviewed: labs, ECG and notes. Labs: ordered. Decision-making details documented in ED Course. Radiology: ordered. Decision-making details documented in ED Course. ECG/medicine tests: ordered and independent interpretation performed. Decision-making details documented in ED Course.  Risk Parenteral controlled substances. Drug therapy requiring intensive monitoring for toxicity. Decision regarding hospitalization.     Medications  oxyCODONE -acetaminophen  (PERCOCET) 5-325 mg tablet 1 tablet (has no administration in time range)  morphine  4 mg/mL injection 4 mg (4 mg Intravenous Given 03/16/23 2257)     Diagnosis:  Diagnosis ICD-10-CM Associated Orders  1. Syncope, unspecified syncope type  R55     2. Closed fracture of ramus of left pubis, initial encounter (CMS-HCC)  S32.592A     3. Contusion of left shoulder, initial encounter  S40.012A     4. Contusion of right knee, initial encounter  S80.01XA         Disposition:   Disposition: Home       Anthony Goertzen MD    Portions of this note have been dictated using Dragon voice to text software. Errors may be present despite proofreading.     Lamb, Anthony Florence, MD 03/17/23 [phone]

## 2023-09-01 DIAGNOSIS — F32A Depression, unspecified: Secondary | ICD-10-CM | POA: Diagnosis not present

## 2023-09-01 DIAGNOSIS — F411 Generalized anxiety disorder: Secondary | ICD-10-CM | POA: Diagnosis not present

## 2023-11-12 DIAGNOSIS — H5213 Myopia, bilateral: Secondary | ICD-10-CM | POA: Diagnosis not present

## 2024-01-05 ENCOUNTER — Encounter: Payer: Self-pay | Admitting: Internal Medicine

## 2024-06-20 NOTE — Progress Notes (Signed)
 Anthony Lamb                                          MRN: [id number]   06/20/2024   The [facility] Quality Team Specialist reviewed this patient medical record for the purposes of chart review for care gap closure. The following were reviewed: chart review for care gap closure-care for older adult medication review.    [facility] Quality Team
